# Patient Record
Sex: Female | Born: 1940
Health system: Southern US, Community
[De-identification: ages and names within clinical notes are randomized; demographics above are authoritative.]

## PROBLEM LIST (undated history)

## (undated) DIAGNOSIS — Z8719 Personal history of other diseases of the digestive system: Secondary | ICD-10-CM

## (undated) DIAGNOSIS — F411 Generalized anxiety disorder: Secondary | ICD-10-CM

## (undated) DIAGNOSIS — R269 Unspecified abnormalities of gait and mobility: Principal | ICD-10-CM

## (undated) DIAGNOSIS — M5137 Other intervertebral disc degeneration, lumbosacral region: Secondary | ICD-10-CM

## (undated) DIAGNOSIS — E785 Hyperlipidemia, unspecified: Secondary | ICD-10-CM

## (undated) DIAGNOSIS — N182 Chronic kidney disease, stage 2 (mild): Secondary | ICD-10-CM

## (undated) DIAGNOSIS — N951 Menopausal and female climacteric states: Secondary | ICD-10-CM

## (undated) DIAGNOSIS — R001 Bradycardia, unspecified: Secondary | ICD-10-CM

## (undated) DIAGNOSIS — E739 Lactose intolerance, unspecified: Secondary | ICD-10-CM

## (undated) DIAGNOSIS — M19049 Primary osteoarthritis, unspecified hand: Secondary | ICD-10-CM

## (undated) DIAGNOSIS — I951 Orthostatic hypotension: Secondary | ICD-10-CM

## (undated) DIAGNOSIS — R21 Rash and other nonspecific skin eruption: Secondary | ICD-10-CM

## (undated) DIAGNOSIS — M5030 Other cervical disc degeneration, unspecified cervical region: Secondary | ICD-10-CM

## (undated) DIAGNOSIS — R55 Syncope and collapse: Secondary | ICD-10-CM

## (undated) DIAGNOSIS — I10 Essential (primary) hypertension: Secondary | ICD-10-CM

## (undated) DIAGNOSIS — F329 Major depressive disorder, single episode, unspecified: Secondary | ICD-10-CM

## (undated) DIAGNOSIS — R5382 Chronic fatigue, unspecified: Secondary | ICD-10-CM

## (undated) DIAGNOSIS — E039 Hypothyroidism, unspecified: Secondary | ICD-10-CM

## (undated) DIAGNOSIS — I5032 Chronic diastolic (congestive) heart failure: Secondary | ICD-10-CM

## (undated) DIAGNOSIS — I739 Peripheral vascular disease, unspecified: Secondary | ICD-10-CM

## (undated) DIAGNOSIS — I351 Nonrheumatic aortic (valve) insufficiency: Secondary | ICD-10-CM

## (undated) DIAGNOSIS — Z950 Presence of cardiac pacemaker: Secondary | ICD-10-CM

## (undated) DIAGNOSIS — Z8673 Personal history of transient ischemic attack (TIA), and cerebral infarction without residual deficits: Secondary | ICD-10-CM

## (undated) DIAGNOSIS — T8543XA Leakage of breast prosthesis and implant, initial encounter: Secondary | ICD-10-CM

## (undated) DIAGNOSIS — I2510 Atherosclerotic heart disease of native coronary artery without angina pectoris: Secondary | ICD-10-CM

## (undated) DIAGNOSIS — I4892 Unspecified atrial flutter: Secondary | ICD-10-CM

## (undated) DIAGNOSIS — I495 Sick sinus syndrome: Secondary | ICD-10-CM

## (undated) DIAGNOSIS — F1090 Alcohol use, unspecified, uncomplicated: Secondary | ICD-10-CM

## (undated) DIAGNOSIS — Z7289 Other problems related to lifestyle: Secondary | ICD-10-CM

## (undated) DIAGNOSIS — I214 Non-ST elevation (NSTEMI) myocardial infarction: Secondary | ICD-10-CM

## (undated) DIAGNOSIS — E875 Hyperkalemia: Secondary | ICD-10-CM

## (undated) DIAGNOSIS — I4819 Other persistent atrial fibrillation: Secondary | ICD-10-CM

## (undated) DIAGNOSIS — I071 Rheumatic tricuspid insufficiency: Secondary | ICD-10-CM

## (undated) HISTORY — DX: Unspecified abnormalities of gait and mobility: R26.9

## (undated) HISTORY — DX: Primary osteoarthritis, unspecified hand: M19.049

## (undated) HISTORY — DX: Other persistent atrial fibrillation: I48.19

## (undated) HISTORY — DX: Personal history of other diseases of the digestive system: Z87.19

## (undated) HISTORY — DX: Generalized anxiety disorder: F41.1

## (undated) HISTORY — PX: OOPHORECTOMY: SHX86

## (undated) HISTORY — DX: Rheumatic tricuspid insufficiency: I07.1

## (undated) HISTORY — DX: Menopausal and female climacteric states: N95.1

## (undated) HISTORY — DX: Other cervical disc degeneration, unspecified cervical region: M50.30

## (undated) HISTORY — DX: Other intervertebral disc degeneration, lumbosacral region: M51.37

## (undated) HISTORY — DX: Nonrheumatic aortic (valve) insufficiency: I35.1

## (undated) HISTORY — DX: Major depressive disorder, single episode, unspecified: F32.9

## (undated) HISTORY — DX: Lactose intolerance, unspecified: E73.9

## (undated) HISTORY — PX: PERIPHERAL ARTERIAL STENT GRAFT: SHX2220

---

## 1973-02-09 HISTORY — PX: VAGINAL HYSTERECTOMY: SUR661

## 1981-02-09 HISTORY — PX: AUGMENTATION MAMMAPLASTY: SUR837

## 1983-02-10 HISTORY — PX: POSTERIOR CERVICAL LAMINECTOMY: SHX2248

## 1993-02-09 HISTORY — PX: FACELIFT, LOWER 2/3: SHX1567

## 1997-11-30 ENCOUNTER — Ambulatory Visit (HOSPITAL_COMMUNITY): Admission: RE | Admit: 1997-11-30 | Discharge: 1997-11-30 | Payer: Self-pay | Admitting: Cardiology

## 1997-11-30 ENCOUNTER — Encounter: Payer: Self-pay | Admitting: Cardiology

## 1998-04-02 ENCOUNTER — Ambulatory Visit (HOSPITAL_COMMUNITY): Admission: RE | Admit: 1998-04-02 | Discharge: 1998-04-02 | Payer: Self-pay | Admitting: Gastroenterology

## 1999-09-17 ENCOUNTER — Ambulatory Visit (HOSPITAL_COMMUNITY): Admission: RE | Admit: 1999-09-17 | Discharge: 1999-09-17 | Payer: Self-pay | Admitting: Cardiology

## 2000-02-10 LAB — CONVERTED CEMR LAB: Pap Smear: NORMAL

## 2002-02-09 LAB — HM MAMMOGRAPHY: HM Mammogram: NORMAL

## 2003-07-28 ENCOUNTER — Emergency Department (HOSPITAL_COMMUNITY): Admission: EM | Admit: 2003-07-28 | Discharge: 2003-07-29 | Payer: Self-pay | Admitting: Emergency Medicine

## 2005-03-12 LAB — HM COLONOSCOPY: HM Colonoscopy: NORMAL

## 2006-02-09 HISTORY — PX: CARPAL TUNNEL RELEASE: SHX101

## 2006-03-25 ENCOUNTER — Ambulatory Visit (HOSPITAL_BASED_OUTPATIENT_CLINIC_OR_DEPARTMENT_OTHER): Admission: RE | Admit: 2006-03-25 | Discharge: 2006-03-26 | Payer: Self-pay | Admitting: Orthopedic Surgery

## 2006-09-16 ENCOUNTER — Ambulatory Visit: Payer: Self-pay | Admitting: Internal Medicine

## 2007-07-01 ENCOUNTER — Encounter: Payer: Self-pay | Admitting: Internal Medicine

## 2007-07-08 ENCOUNTER — Encounter: Payer: Self-pay | Admitting: Internal Medicine

## 2007-08-19 ENCOUNTER — Encounter: Admission: RE | Admit: 2007-08-19 | Discharge: 2007-08-19 | Payer: Self-pay | Admitting: General Surgery

## 2007-09-02 ENCOUNTER — Encounter: Payer: Self-pay | Admitting: Internal Medicine

## 2007-10-20 ENCOUNTER — Inpatient Hospital Stay (HOSPITAL_COMMUNITY): Admission: RE | Admit: 2007-10-20 | Discharge: 2007-10-25 | Payer: Self-pay | Admitting: General Surgery

## 2007-10-20 ENCOUNTER — Encounter (INDEPENDENT_AMBULATORY_CARE_PROVIDER_SITE_OTHER): Payer: Self-pay | Admitting: General Surgery

## 2007-11-09 ENCOUNTER — Encounter: Payer: Self-pay | Admitting: Internal Medicine

## 2008-01-12 ENCOUNTER — Ambulatory Visit: Payer: Self-pay | Admitting: Internal Medicine

## 2008-01-23 ENCOUNTER — Encounter: Payer: Self-pay | Admitting: Internal Medicine

## 2008-02-10 HISTORY — PX: PARTIAL COLECTOMY: SHX5273

## 2008-08-06 ENCOUNTER — Encounter: Payer: Self-pay | Admitting: Internal Medicine

## 2008-08-28 ENCOUNTER — Encounter: Admission: RE | Admit: 2008-08-28 | Discharge: 2008-08-28 | Payer: Self-pay | Admitting: Orthopaedic Surgery

## 2008-09-25 ENCOUNTER — Encounter: Payer: Self-pay | Admitting: Internal Medicine

## 2008-11-16 ENCOUNTER — Encounter: Payer: Self-pay | Admitting: Internal Medicine

## 2009-07-25 ENCOUNTER — Encounter: Payer: Self-pay | Admitting: Internal Medicine

## 2009-11-20 ENCOUNTER — Telehealth (INDEPENDENT_AMBULATORY_CARE_PROVIDER_SITE_OTHER): Payer: Self-pay | Admitting: *Deleted

## 2009-12-11 ENCOUNTER — Encounter: Payer: Self-pay | Admitting: Internal Medicine

## 2009-12-12 ENCOUNTER — Encounter: Payer: Self-pay | Admitting: Internal Medicine

## 2009-12-31 ENCOUNTER — Ambulatory Visit: Payer: Self-pay | Admitting: Internal Medicine

## 2009-12-31 DIAGNOSIS — M19049 Primary osteoarthritis, unspecified hand: Secondary | ICD-10-CM

## 2009-12-31 DIAGNOSIS — F419 Anxiety disorder, unspecified: Secondary | ICD-10-CM | POA: Insufficient documentation

## 2009-12-31 DIAGNOSIS — F3289 Other specified depressive episodes: Secondary | ICD-10-CM

## 2009-12-31 DIAGNOSIS — I059 Rheumatic mitral valve disease, unspecified: Secondary | ICD-10-CM | POA: Insufficient documentation

## 2009-12-31 DIAGNOSIS — I080 Rheumatic disorders of both mitral and aortic valves: Secondary | ICD-10-CM | POA: Insufficient documentation

## 2009-12-31 DIAGNOSIS — F329 Major depressive disorder, single episode, unspecified: Secondary | ICD-10-CM

## 2009-12-31 DIAGNOSIS — M51379 Other intervertebral disc degeneration, lumbosacral region without mention of lumbar back pain or lower extremity pain: Secondary | ICD-10-CM

## 2009-12-31 DIAGNOSIS — M5137 Other intervertebral disc degeneration, lumbosacral region: Secondary | ICD-10-CM | POA: Insufficient documentation

## 2009-12-31 DIAGNOSIS — I2510 Atherosclerotic heart disease of native coronary artery without angina pectoris: Secondary | ICD-10-CM | POA: Insufficient documentation

## 2009-12-31 DIAGNOSIS — E739 Lactose intolerance, unspecified: Secondary | ICD-10-CM | POA: Insufficient documentation

## 2009-12-31 DIAGNOSIS — N951 Menopausal and female climacteric states: Secondary | ICD-10-CM | POA: Insufficient documentation

## 2009-12-31 DIAGNOSIS — E785 Hyperlipidemia, unspecified: Secondary | ICD-10-CM | POA: Insufficient documentation

## 2009-12-31 DIAGNOSIS — M5030 Other cervical disc degeneration, unspecified cervical region: Secondary | ICD-10-CM

## 2009-12-31 DIAGNOSIS — I10 Essential (primary) hypertension: Secondary | ICD-10-CM | POA: Insufficient documentation

## 2009-12-31 DIAGNOSIS — Z8719 Personal history of other diseases of the digestive system: Secondary | ICD-10-CM

## 2009-12-31 DIAGNOSIS — F411 Generalized anxiety disorder: Secondary | ICD-10-CM

## 2009-12-31 DIAGNOSIS — R32 Unspecified urinary incontinence: Secondary | ICD-10-CM | POA: Insufficient documentation

## 2009-12-31 DIAGNOSIS — R109 Unspecified abdominal pain: Secondary | ICD-10-CM | POA: Insufficient documentation

## 2009-12-31 HISTORY — DX: Major depressive disorder, single episode, unspecified: F32.9

## 2009-12-31 HISTORY — DX: Lactose intolerance, unspecified: E73.9

## 2009-12-31 HISTORY — DX: Other specified depressive episodes: F32.89

## 2009-12-31 HISTORY — DX: Hyperlipidemia, unspecified: E78.5

## 2009-12-31 HISTORY — DX: Generalized anxiety disorder: F41.1

## 2009-12-31 HISTORY — DX: Other intervertebral disc degeneration, lumbosacral region: M51.37

## 2009-12-31 HISTORY — DX: Other intervertebral disc degeneration, lumbosacral region without mention of lumbar back pain or lower extremity pain: M51.379

## 2009-12-31 HISTORY — DX: Primary osteoarthritis, unspecified hand: M19.049

## 2009-12-31 HISTORY — DX: Other cervical disc degeneration, unspecified cervical region: M50.30

## 2009-12-31 HISTORY — DX: Menopausal and female climacteric states: N95.1

## 2009-12-31 HISTORY — DX: Personal history of other diseases of the digestive system: Z87.19

## 2009-12-31 LAB — CONVERTED CEMR LAB
Bilirubin Urine: NEGATIVE
Hemoglobin, Urine: NEGATIVE
Ketones, ur: NEGATIVE mg/dL
Leukocytes, UA: NEGATIVE
Nitrite: NEGATIVE
Specific Gravity, Urine: <=1.005 (ref 1.000–1.030)
Total Protein, Urine: NEGATIVE mg/dL
Urine Glucose: NEGATIVE mg/dL
Urobilinogen, UA: 0.2 (ref 0.0–1.0)
pH: 6 (ref 5.0–8.0)

## 2010-01-01 ENCOUNTER — Encounter: Payer: Self-pay | Admitting: Internal Medicine

## 2010-01-01 ENCOUNTER — Ambulatory Visit: Payer: Self-pay | Admitting: Cardiology

## 2010-02-04 ENCOUNTER — Encounter: Payer: Self-pay | Admitting: Internal Medicine

## 2010-02-14 ENCOUNTER — Encounter
Admission: RE | Admit: 2010-02-14 | Discharge: 2010-02-14 | Payer: Self-pay | Source: Home / Self Care | Attending: Cardiology | Admitting: Cardiology

## 2010-02-20 ENCOUNTER — Encounter: Payer: Self-pay | Admitting: Internal Medicine

## 2010-02-20 ENCOUNTER — Inpatient Hospital Stay (HOSPITAL_COMMUNITY)
Admission: RE | Admit: 2010-02-20 | Discharge: 2010-02-21 | Payer: Self-pay | Source: Home / Self Care | Attending: Cardiovascular Disease | Admitting: Cardiovascular Disease

## 2010-02-24 LAB — TROPONIN I: Troponin I: 0.01 ng/mL (ref 0.00–0.06)

## 2010-02-24 LAB — BASIC METABOLIC PANEL
BUN: 14 mg/dL (ref 6–23)
BUN: 11 mg/dL (ref 6–23)
CO2: 28 mEq/L (ref 19–32)
CO2: 30 mEq/L (ref 19–32)
Calcium: 10.2 mg/dL (ref 8.4–10.5)
Calcium: 10.5 mg/dL (ref 8.4–10.5)
Chloride: 103 mEq/L (ref 96–112)
Chloride: 103 mEq/L (ref 96–112)
Creatinine, Ser: 0.89 mg/dL (ref 0.4–1.2)
Creatinine, Ser: 0.78 mg/dL (ref 0.4–1.2)
GFR calc Af Amer: >60 mL/min (ref >60)
GFR calc Af Amer: >60 mL/min (ref >60)
GFR calc non Af Amer: >60 mL/min (ref >60)
GFR calc non Af Amer: >60 mL/min (ref >60)
Glucose, Bld: 111 mg/dL — ABNORMAL HIGH (ref 70–99)
Glucose, Bld: 114 mg/dL — ABNORMAL HIGH (ref 70–99)
Potassium: 4.9 mEq/L (ref 3.5–5.1)
Potassium: 5 mEq/L (ref 3.5–5.1)
Sodium: 135 mEq/L (ref 135–145)
Sodium: 137 mEq/L (ref 135–145)

## 2010-02-24 LAB — CBC
HCT: 36 % (ref 36.0–46.0)
HCT: 38.4 % (ref 36.0–46.0)
Hemoglobin: 11.9 g/dL — ABNORMAL LOW (ref 12.0–15.0)
Hemoglobin: 12.6 g/dL (ref 12.0–15.0)
MCH: 29.8 pg (ref 26.0–34.0)
MCH: 29.6 pg (ref 26.0–34.0)
MCHC: 33.1 g/dL (ref 30.0–36.0)
MCHC: 32.8 g/dL (ref 30.0–36.0)
MCV: 90 fL (ref 78.0–100.0)
MCV: 90.1 fL (ref 78.0–100.0)
Platelets: 239 10*3/uL (ref 150–400)
Platelets: 250 10*3/uL (ref 150–400)
RBC: 4 MIL/uL (ref 3.87–5.11)
RBC: 4.26 MIL/uL (ref 3.87–5.11)
RDW: 13 % (ref 11.5–15.5)
RDW: 12.9 % (ref 11.5–15.5)
WBC: 6 10*3/uL (ref 4.0–10.5)
WBC: 8.3 10*3/uL (ref 4.0–10.5)

## 2010-02-24 LAB — CARDIAC PANEL(CRET KIN+CKTOT+MB+TROPI)
CK, MB: 1 ng/mL (ref 0.3–4.0)
CK, MB: 0.9 ng/mL (ref 0.3–4.0)
CK, MB: 0.9 ng/mL (ref 0.3–4.0)
Relative Index: INVALID (ref 0.0–2.5)
Relative Index: INVALID (ref 0.0–2.5)
Relative Index: INVALID (ref 0.0–2.5)
Total CK: 31 U/L (ref 7–177)
Total CK: 29 U/L (ref 7–177)
Total CK: 29 U/L (ref 7–177)
Troponin I: <0.01 ng/mL (ref 0.00–0.06)
Troponin I: <0.01 ng/mL (ref 0.00–0.06)
Troponin I: <0.01 ng/mL (ref 0.00–0.06)

## 2010-02-24 LAB — CK TOTAL AND CKMB (NOT AT ARMC)
CK, MB: 1.1 ng/mL (ref 0.3–4.0)
Relative Index: INVALID (ref 0.0–2.5)
Total CK: 40 U/L (ref 7–177)

## 2010-02-25 ENCOUNTER — Encounter: Payer: Self-pay | Admitting: Internal Medicine

## 2010-03-03 ENCOUNTER — Encounter: Payer: Self-pay | Admitting: Internal Medicine

## 2010-03-10 ENCOUNTER — Inpatient Hospital Stay (HOSPITAL_COMMUNITY)
Admission: RE | Admit: 2010-03-10 | Discharge: 2010-03-11 | Payer: Self-pay | Source: Home / Self Care | Attending: Cardiovascular Disease | Admitting: Cardiovascular Disease

## 2010-03-10 LAB — PROTIME-INR
INR: 0.95 (ref 0.00–1.49)
Prothrombin Time: 12.9 seconds (ref 11.6–15.2)

## 2010-03-10 LAB — BASIC METABOLIC PANEL
BUN: 10 mg/dL (ref 6–23)
CO2: 28 mEq/L (ref 19–32)
Calcium: 10.6 mg/dL — ABNORMAL HIGH (ref 8.4–10.5)
Chloride: 105 mEq/L (ref 96–112)
Creatinine, Ser: 0.73 mg/dL (ref 0.4–1.2)
GFR calc Af Amer: >60 mL/min (ref >60)
GFR calc non Af Amer: >60 mL/min (ref >60)
Glucose, Bld: 121 mg/dL — ABNORMAL HIGH (ref 70–99)
Potassium: 5.4 mEq/L — ABNORMAL HIGH (ref 3.5–5.1)
Sodium: 141 mEq/L (ref 135–145)

## 2010-03-10 LAB — CBC
HCT: 37.6 % (ref 36.0–46.0)
Hemoglobin: 12.2 g/dL (ref 12.0–15.0)
MCH: 29 pg (ref 26.0–34.0)
MCHC: 32.4 g/dL (ref 30.0–36.0)
MCV: 89.5 fL (ref 78.0–100.0)
Platelets: 331 10*3/uL (ref 150–400)
RBC: 4.2 MIL/uL (ref 3.87–5.11)
RDW: 13.2 % (ref 11.5–15.5)
WBC: 6.1 10*3/uL (ref 4.0–10.5)

## 2010-03-10 LAB — POCT ACTIVATED CLOTTING TIME
Activated Clotting Time: 187 seconds
Activated Clotting Time: 158 seconds

## 2010-03-10 LAB — APTT: aPTT: 31 seconds (ref 24–37)

## 2010-03-11 LAB — CBC
HCT: 38.5 % (ref 36.0–46.0)
Hemoglobin: 12.6 g/dL (ref 12.0–15.0)
MCH: 29.2 pg (ref 26.0–34.0)
MCHC: 32.7 g/dL (ref 30.0–36.0)
MCV: 89.3 fL (ref 78.0–100.0)
Platelets: 302 10*3/uL (ref 150–400)
RBC: 4.31 MIL/uL (ref 3.87–5.11)
RDW: 13 % (ref 11.5–15.5)
WBC: 6.5 10*3/uL (ref 4.0–10.5)

## 2010-03-11 LAB — BASIC METABOLIC PANEL
BUN: 10 mg/dL (ref 6–23)
CO2: 27 mEq/L (ref 19–32)
Calcium: 10.3 mg/dL (ref 8.4–10.5)
Chloride: 102 mEq/L (ref 96–112)
Creatinine, Ser: 0.69 mg/dL (ref 0.4–1.2)
GFR calc Af Amer: >60 mL/min (ref >60)
GFR calc non Af Amer: >60 mL/min (ref >60)
Glucose, Bld: 108 mg/dL — ABNORMAL HIGH (ref 70–99)
Potassium: 4.5 mEq/L (ref 3.5–5.1)
Sodium: 139 mEq/L (ref 135–145)

## 2010-03-13 LAB — POCT ACTIVATED CLOTTING TIME
Activated Clotting Time: 228 seconds
Activated Clotting Time: 234 seconds

## 2010-03-13 NOTE — Procedures (Signed)
Summary: [facility]   Imported By: Sherian Rein 12/19/2009 14:13:23  _____________________________________________________________________  External Attachment:    Type:   Image     Comment:   External Document

## 2010-03-13 NOTE — Letter (Signed)
Summary: [facility]  [facility]   Imported By: Sherian Rein 02/25/2010 09:04:28  _____________________________________________________________________  External Attachment:    Type:   Image     Comment:   External Document

## 2010-03-13 NOTE — Consult Note (Signed)
Summary: [facility]   Imported By: Esmeralda Links D'jimraou 07/20/2007 12:58:16  _____________________________________________________________________  External Attachment:    Type:   Image     Comment:   External Document

## 2010-03-13 NOTE — Letter (Signed)
Summary: [facility]  [facility]   Imported By: Sherian Rein 11/26/2008 08:47:36  _____________________________________________________________________  External Attachment:    Type:   Image     Comment:   External Document

## 2010-03-13 NOTE — Consult Note (Signed)
Summary: Imported By: Esmeralda Links D'jimraou 09/09/2007 13:17:20   Imported By: Esmeralda Links D'jimraou 09/09/2007 13:17:20  _____________________________________________________________________  External Attachment:    Type:   Image     Comment:   External Document

## 2010-03-13 NOTE — Letter (Signed)
Summary: [facility]  [facility]   Imported By: Sherian Rein 09/28/2008 10:28:54  _____________________________________________________________________  External Attachment:    Type:   Image     Comment:   External Document

## 2010-03-13 NOTE — Progress Notes (Signed)
Summary: [facility]   Imported By: Esmeralda Links D'jimraou 07/13/2007 11:49:21  _____________________________________________________________________  External Attachment:    Type:   Image     Comment:   External Document

## 2010-03-13 NOTE — Letter (Signed)
Summary: Low back & bilateral [facility]  [facility]   Imported By: Sherian Rein 08/09/2008 15:12:42  _____________________________________________________________________  External Attachment:    Type:   Image     Comment:   External Document

## 2010-03-13 NOTE — Letter (Signed)
Summary: Wound check/[facility]  Wound check/[facility]   Imported By: Lester [place] 02/01/2008 09:14:19  _____________________________________________________________________  External Attachment:    Type:   Image     Comment:   External Document

## 2010-03-13 NOTE — Progress Notes (Signed)
  Phone Note Other Incoming   Request: Send information Summary of Call: Request for records received from [facility] [place], [place]. Request forwarded to [employer].

## 2010-03-13 NOTE — Assessment & Plan Note (Signed)
Summary: FLU Zara Council Natale Milch  Nurse Visit   Vitals Entered By: Payton Spark CMA (January 12, 2008 11:19 AM)                    Influenza Vaccine    Vaccine Type: Fluvax Non-[name]    Site: left deltoid    Mfr: GlaxoSmithKline    Dose: 0.5 ml    Route: IM    Given by: Payton Spark CMA    Exp. Date: 08/08/2008    Lot #: [id number]    VIS given: 09/02/06 version given January 12, 2008.  Flu Vaccine Consent Questions    Do you have a history of severe allergic reactions to this vaccine? no    Any prior history of allergic reactions to egg and/or gelatin? no    Do you have a sensitivity to the preservative Thimersol? no    Do you have a past history of Guillan-Barre Syndrome? no    Do you currently have an acute febrile illness? no    Have you ever had a severe reaction to latex? no    Vaccine information given and explained to patient? yes    Are you currently pregnant? no   Orders Added: 1)  Influenza Vaccine NON [name] [00028]    ]

## 2010-03-13 NOTE — Letter (Signed)
Summary: [facility]  [facility]   Imported By: Sherian Rein 12/19/2009 14:42:25  _____________________________________________________________________  External Attachment:    Type:   Image     Comment:   External Document

## 2010-03-13 NOTE — Letter (Signed)
Summary: [facility]  [facility]   Imported By: Lester Galva 07/31/2009 07:43:15  _____________________________________________________________________  External Attachment:    Type:   Image     Comment:   External Document

## 2010-03-13 NOTE — Assessment & Plan Note (Signed)
Summary: last ov 2008/abd pain/medicare/cd   Vital Signs:  Patient profile:   70 year old female Height:      64.5 inches Weight:      159.13 pounds BMI:     26.99 O2 Sat:      97 % on Room air Temp:     98.9 degrees F oral Pulse rate:   57 / minute BP sitting:   120 / 70  (left arm) Cuff size:   regular  Vitals Entered By: Zella Ball Ewing CMA Duncan Dull) (December 31, 2009 3:18 PM)  O2 Flow:  Room air  Preventive Care Screening  Colonoscopy:    Date:  03/12/2005    Next Due:  03/2010    Results:  normal   Mammogram:    Date:  02/09/2002    Results:  normal   Last Tetanus Booster:    Date:  12/10/2000    Results:  Tdap   Bone Density:    Date:  02/10/2000    Results:  normal std dev  Pap Smear:    Date:  02/10/2000    Results:  normal   Last Pneumovax:    Date:  02/10/1999    Results:  given      declines mamogram, colonoscopy or immunizations  CC: Re-establish, stomach pain, fever/RE   CC:  Re-establish, stomach pain, and fever/RE.  History of Present Illness: here as new pt to re-establish after being lost to f/u for > 3 yrs;  has hx of diverticulitis requiring partial colectomy, as well as chronic recurrent urinary incontinence no change recently without dysuria, urgency change, blood or back pain.  Has LLQ adn mid lower abd pain mild to mod persistent for 10-14 days (maybe actually better in last 2 days) ;  did see urgent care with neg urine cx per pt prior to this time,  and has spent most of the last 10 days in bed ill  out of town for vacation (not clear why she did not seek further care during this time).  Has some nausea, but no vomiting and little to no bowel change or blood.  No sure about fever, but has had several chills in the past few days.  Has bee on statin wtihout significant bowel change in the past.  Pt denies CP, worsening sob, doe, wheezing, orthopnea, pnd, worsening LE edema, palps, dizziness or syncope  Pt denies new neuro symptoms such as  headache, facial or extremity weakness  Pt denies polydipsia, polyuria.  Just had labs approx 2 wks ago per DR Little/cards as well m, with whom she has followed for HTN, elev chol, and symtpomatic LE PVD .  Stopped smoking yrs ago.  Overall good compliance with meds, and good tolerability.   Preventive Screening-Counseling & Management  Alcohol-Tobacco     Smoking Status: quit      Drug Use:  no.    Problems Prior to Update: 1)  Menopause, Early  (ICD-627.2) 2)  Coronary Artery Disease  (ICD-414.00) 3)  Depression  (ICD-311) 4)  Mitral Regurgitation  (ICD-396.3) 5)  Mitral Valve Prolapse  (ICD-424.0) 6)  Glucose Intolerance  (ICD-271.3) 7)  Disc Disease, Cervical  (ICD-722.4) 8)  Unspecified Urinary Incontinence  (ICD-788.30) 9)  Diverticulitis, Hx of  (ICD-V12.79) 10)  Abdominal Pain, Lower  (ICD-789.09) 11)  Disc Disease, Lumbar  (ICD-722.52) 12)  Osteoarthritis, Hand  (ICD-715.94) 13)  Hyperlipidemia  (ICD-272.4) 14)  Anxiety  (ICD-300.00) 15)  Hypertension  (ICD-401.9)  Medications Prior to Update: 1)  None  Current Medications (verified): 1)  Meloxicam 15 Mg Tabs (Meloxicam) .Marland Kitchen.. 1 By Mouth Once Daily 2)  Citalopram Hydrobromide 20 Mg Tabs (Citalopram Hydrobromide) .Marland Kitchen.. 1 By Mouth Once Daily 3)  Losartan Potassium 100 Mg Tabs (Losartan Potassium) .Marland Kitchen.. 1 By Mouth Once Daily 4)  Amlodipine Besylate 5 Mg Tabs (Amlodipine Besylate) .Marland Kitchen.. 1 By Mouth Once Daily 5)  Vitamin B-12 1000 Mcg Tabs (Cyanocobalamin) .Marland Kitchen.. 1 By Mouth Once Daily 6)  Aspir-Low 81 Mg Tbec (Aspirin) .Marland Kitchen.. 1 By Mouth Once Daily 7)  Ciprofloxacin Hcl 500 Mg Tabs (Ciprofloxacin Hcl) .Marland Kitchen.. 1po Two Times A Day 8)  Metronidazole 250 Mg Tabs (Metronidazole) .Marland Kitchen.. 1 By Mouth Qid  Allergies (verified): No Known Drug Allergies  Past History:  Family History: Last updated: 12/31/2009 heart disease - 3 brother (2 also with cva)  Social History: Last updated: 12/31/2009 Former Smoker Alcohol use-no Drug  use-no Married 3 children retired - mortgage banking  Risk Factors: Smoking Status: quit (12/31/2009)  Past Medical History: Hypertension Anxiety DJD - hands, lumbar disc disease/c-spine disc disease Hyperlipidemia ?PVD - for dopper arterial dec 2011 known inguinal right hernia Diverticulitis, hx of urinary incontinence, chronic glucose intolerance MVP/MR Depression Coronary artery disease  - minimal LAD by cath 2002 early menopause  Past Surgical History: s/p  right thumb surgury 2008 neg card cath per pt approx 2005 s/p bilat breast implants  Hysterectomy/endometriosis Oophorectomy -  1975 hx of complicated diverticulitis - s/p partial colectomy Carpal tunnel release s/p c-spine surgury  Family History: Reviewed history and no changes required. heart disease - 3 brother (2 also with cva)  Social History: Reviewed history and no changes required. 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 , mild ill  Head:  normocephalic and atraumatic.   Eyes:  vision grossly intact, pupils equal, and pupils round.   Ears:  R ear normal and L ear normal.   Nose:  no external deformity and no nasal discharge.   Mouth:  no gingival abnormalities and pharynx pink and moist.   Neck:  supple and no masses.   Lungs:  normal respiratory effort and normal breath sounds.   Heart:  normal rate and regular rhythm.   Abdomen:  soft, normal bowel sounds, no distention, no guarding, no rebound tenderness, no hepatomegaly, and no splenomegaly.  but has mild to mod left lower and mid lower abd tender Msk:  no joint tenderness and no joint swelling.   Extremities:  no edema, no erythema  Skin:  no rashes.   Psych:  slightly anxious.     Impression & Recommendations:  Problem # 1:   ABDOMINAL PAIN, LOWER (ICD-789.09)  Her updated medication list for this problem includes:    Meloxicam 15 Mg Tabs (Meloxicam) .Marland Kitchen... 1 by mouth once daily    Aspir-low 81 Mg Tbec (Aspirin) .Marland Kitchen... 1 by mouth once daily mid and left lower abd, to check urine studies, but no worsening urine symtpoms, will today presume smoldering or worse recurrent diverticulitis, tx with cipro/flagyl, f/u dr Fredirick Maudlin labs from 2 wks ago, declines other labs today except for urine studies, and also for CT abd/pelvis; consider GI consult  Orders: T-Culture, Urine ([id number]) Radiology Referral (Radiology) TLB-Udip w/ Micro (81001-URINE)  Problem # 2:  HYPERTENSION (ICD-401.9)  Her updated  medication list for this problem includes:    Losartan Potassium 100 Mg Tabs (Losartan potassium) .Marland Kitchen... 1 by mouth once daily    Amlodipine Besylate 5 Mg Tabs (Amlodipine besylate) .Marland Kitchen... 1 by mouth once daily  BP today: 120/70 stable overall by hx and exam, ok to continue meds/tx as is   Problem # 3:  HYPERLIPIDEMIA (ICD-272.4) unclear control, good complaicne with statin, will ask for recent labs per dr Clarene Duke  Problem # 4:  GLUCOSE INTOLERANCE (ICD-271.3) asympt for now; f/u glc with recent labs  Complete Medication List: 1)  Meloxicam 15 Mg Tabs (Meloxicam) .Marland Kitchen.. 1 by mouth once daily 2)  Citalopram Hydrobromide 20 Mg Tabs (Citalopram hydrobromide) .Marland Kitchen.. 1 by mouth once daily 3)  Losartan Potassium 100 Mg Tabs (Losartan potassium) .Marland Kitchen.. 1 by mouth once daily 4)  Amlodipine Besylate 5 Mg Tabs (Amlodipine besylate) .Marland Kitchen.. 1 by mouth once daily 5)  Vitamin B-12 1000 Mcg Tabs (Cyanocobalamin) .Marland Kitchen.. 1 by mouth once daily 6)  Aspir-low 81 Mg Tbec (Aspirin) .Marland Kitchen.. 1 by mouth once daily 7)  Ciprofloxacin Hcl 500 Mg Tabs (Ciprofloxacin hcl) .Marland Kitchen.. 1po two times a day 8)  Metronidazole 250 Mg Tabs (Metronidazole) .Marland Kitchen.. 1 by mouth qid  Patient Instructions: 1)  Please go to the Lab in the basement for your blood and/or urine tests  today 2)  You will be contacted about the referral(s) to: CT scan - to see the [facility] prior to leaving today 3)  Please take all new medications as prescribed 4)  Continue all previous medications as before this visit 5)  we will get the lab results from Dr Little's office 6)  Please schedule a follow-up appointment in 6 months with CPX and labs, or sooner if needed Prescriptions: METRONIDAZOLE 250 MG TABS (METRONIDAZOLE) 1 by mouth qid  #40 x 0   Entered and Authorized by:   Corwin Levins MD   Signed by:   Corwin Levins MD on 12/31/2009   Method used:   Print then Give to Patient   RxID:   [id number] CIPROFLOXACIN HCL 500 MG TABS (CIPROFLOXACIN HCL) 1po two times a day  #20 x 0   Entered and Authorized by:   Corwin Levins MD   Signed by:   Corwin Levins MD on 12/31/2009   Method used:   Print then Give to Patient   RxID:   [id number]    Orders Added: 1)  T-Culture, Urine [[id number]] 2)  Radiology Referral [Radiology] 3)  TLB-Udip w/ Micro [81001-URINE] 4)  New Patient Level IV [41324]

## 2010-03-13 NOTE — Consult Note (Signed)
Summary: [facility]   Imported By: Esmeralda Links D'jimraou 11/18/2007 11:21:27  _____________________________________________________________________  External Attachment:    Type:   Image     Comment:   External Document

## 2010-03-21 NOTE — Discharge Summary (Signed)
  [name], [name] NO.:  [id number]  MEDICAL RECORD NO.:  [id number]          PATIENT TYPE:  INP  LOCATION:  6529                         FACILITY:  [facility]  PHYSICIAN:  Nanetta Batty, M.D.   DATE OF BIRTH:  03/30/1940  DATE OF ADMISSION:  03/10/2010 DATE OF DISCHARGE:  03/11/2010                              DISCHARGE SUMMARY   DISCHARGE DIAGNOSES: 1. Claudication, right superficial femoral artery stenting on February 20, 2010, with a staged left superficial femoral artery stenting     this admission on March 10, 2010. 2. Treated hypertension. 3. Treated dyslipidemia.  HOSPITAL COURSE:  The patient is a pleasant 70 year old female followed by Dr. Clarene Duke and Dr. Oliver Barre.  She was referred to Dr. Allyson Sabal for claudication.  She underwent right SFA stenting on February 20, 2010, and is admitted now for elective left SFA stenting.  She was admitted as a same-day admission.  She tolerated the procedure well.  Postoperatively, she is stable and we feel she can be discharged.  She will need followup Dopplers as an outpatient and an office visit with Dr. Allyson Sabal once and then can be referred back to her primary cardiologist and primary care MD.  LABORATORY DATA:  Sodium 139, potassium 4.5, BUN 10, creatinine 0.69. White count 6.5, hemoglobin 12.6, hematocrit 38.5, platelets 302,000.  A chest x-ray was done on February 14, 2010, this showed no active lung disease with slight hyperaeration.  Telemetry shows sinus rhythm, sinus bradycardia.  EKG from February 21, 2010, shows sinus rhythm, sinus bradycardia without acute changes.  The patient did have an outpatient Myoview on February 20, 2010, which was negative for ischemia.  Remote catheterization in 2001 showed mild coronary disease.  Please see med rec for complete discharge medications.  DISPOSITION:  The patient was discharged in stable condition and will follow up with Dr. Allyson Sabal in a couple  weeks.     Abelino Derrick, P.A.   ______________________________ Nanetta Batty, M.D.   Lenard Lance  D:  03/11/2010  T:  03/11/2010  Job:  [id number]  cc:   Corwin Levins, MD Thereasa Solo Little, M.D.  Electronically Signed by Corine Shelter P.A. on 03/18/2010 12:07:52 PM Electronically Signed by Nanetta Batty M.D. on 03/21/2010 08:06:05 AM

## 2010-03-21 NOTE — Discharge Summary (Signed)
[name], JHA NO.:  [id number]  MEDICAL RECORD NO.:  [id number]          PATIENT TYPE:  INP  LOCATION:  6527                         FACILITY:  [facility]  PHYSICIAN:  Nanetta Batty, M.D.   DATE OF BIRTH:  15-Dec-1940  DATE OF ADMISSION:  02/20/2010 DATE OF DISCHARGE:  02/21/2010                              DISCHARGE SUMMARY   DISCHARGE DIAGNOSES: 1. Claudication. 2. Peripheral vascular disease with bilateral mid right and left     superficial femoral artery stenosis, undergoing PTA and stent     deployment to the mid right superficial femoral artery stenosis due     to increased claudication in the right.  Residual left superficial     femoral artery stenosis, plans for staged intervention in the     future. 3. Chest pain after cardiac cath with negative cardiac enzymes and     stable EKGs.     a.     History of 30% to 40% left anterior descending stenosis in      2001. 4. Hyperlipidemia. 5. Hypertension. 6. Sinus bradycardia.  DISCHARGE CONDITION:  Stable.  PROCEDURES:  PV angiogram, February 20, 2010, with PTA and stent deployment to the right SFA by Dr. Nanetta Batty.  HOSPITAL COURSE:  Ms. Underberg was admitted after her PV angiogram and right PTA and stent to the SFA.  After the procedure, patient did develop chest pain and was given fentanyl, morphine 2 mg IV, sublingual nitro spray.  EKG was without acute changes.  Cardiac enzymes were done, which were all negative.  She was then placed on 6500.  She stabilized and had no further chest pain.  Unsure if this was related to Plavix, reflux, or cardiac issues.  By February 21, 2010, she ambulated without problems.  Her labs were stable with CK 29, MB 0.9, and troponin I less than 0.01.  Hemoglobin 11.9, hematocrit 36, WBC 6.0, and platelets 239.  Chemistry, sodium 135, potassium 4.9, BUN 14, creatinine 0.89 and glucose 111.  PHYSICAL EXAMINATION:  VITAL SIGNS:  Blood pressure 110/58, pulse 49  to 44, respiratory rate 12, temperature 98.3, oxygen saturation on 2 L 95%. HEART:  Regular rate and rhythm. LUNGS:  Clear. EXTREMITIES:  Her groin cath site was stable with 2+ right pedal pulses.  DISCHARGE INSTRUCTIONS: 1. Increase activity slowly.  May shower.  No lifting for 1 week.  No     driving for 2 days.  No sexual activity for 2 days. 2. Low-sodium, heart-healthy diet.  Wash cath site with soap and     water.  Call if any bleeding, swelling or drainage. 3. Follow up with Dr. Allyson Sabal, March 03, 2010, at 9:15 a.m. 4. You are scheduled for Persantine Myoview stress test in our office     on February 25, 2010, at 7:30 in the morning.  Do not eat or drink     after midnight, the night before the test; no caffeine for 24 hours     before the test.  Wear two-piece clothing and no perfumes.  Once her stress test is stable, she will be scheduled for staged  intervention to the left SFA if it is positive.  Discussion will be made about cardiac catheterization.  HOME MEDICATIONS:  Please see medication reconciliation sheet.  Pepcid was added to her medications as well as her Plavix.     [name]. [name], N.P.   ______________________________ Nanetta Batty, M.D.    LRI/MEDQ  D:  02/21/2010  T:  02/22/2010  Job:  [id number]  cc:   Corwin Levins, MD Thereasa Solo Little, M.D.  Electronically Signed by Nada Boozer N.P. on 03/07/2010 07:46:18 PM Electronically Signed by Nanetta Batty M.D. on 03/21/2010 08:06:03 AM

## 2010-03-21 NOTE — Procedures (Signed)
  [name], NEWLUN NO.:  [id number]  MEDICAL RECORD NO.:  [id number]          PATIENT TYPE:  OIB  LOCATION:  6529                         FACILITY:  [facility]  PHYSICIAN:  Nanetta Batty, M.D.   DATE OF BIRTH:  Jun 26, 1940  DATE OF PROCEDURE:  03/10/2010 DATE OF DISCHARGE:                   PERIPHERAL VASCULAR INVASIVE PROCEDURE   PROCEDURE:  Left lower extremity angiogram/percutaneous transluminal angioplasty and stent placement.  OPERATOR:  Nanetta Batty, MD  INDICATIONS FOR PROCEDURE:  Ms. [name] is a 70 year old mildly overweight married Caucasian female mother of three, grandmother of six grandchildren, and patient of Dr. Clarene Duke and Dr. Jonny Ruiz.  She is referred to me because of claudication.  She has a history of hypertension, hyperlipidemia, family history as well as remote tobacco abuse.  She is complaining of claudication.  Lower extremity Dopplers done in the office in December revealed a right ABI of 0.75 and on the left of 1 with high-frequency signals in both SFAs, right greater than left.  I did angiogram her on February 20, 2010 and stented her right SFA resulting in clinical improvement.  She is here for staged left SFA intervention for lifestyle-limiting claudication.  PROCEDURE DESCRIPTION:  The patient was brought to the second floor [facility] angiographic suite in the postabsorptive state.  She was premedicated with p.o. Valium.  Right groin was prepped and shaved in the usual sterile fashion.  Xylocaine 1% was used for local anesthesia. A 6-French crossover sheath was inserted into the right femoral artery using standard Seldinger technique.  A 5-French crossover catheter as well as 0.035 Versacore wire was used to obtain contralateral access. Total of 6000 units of heparin was administered intravenously. Visipaque dye was used for the entirety of the case.  Retrograde aortic pressures were monitored during the case.  The Versacore wire  crossed the lesion with the aid of a 4 x 4 balloon. Overlapping inflations were performed with nominal pressures.  Stenting was performed with a 6 x 80 Abbott Absolute Pro nitinol self-expanding stent with postdilatation with a 5 x 60 balloon at 4-6 atmospheres resulting reduction of tandem 60-75% fairly focal stenosis to 0% residual with excellent flow.  The patient tolerated the procedure well. Sheath was then withdrawn across the bifurcation and the wire was removed.  The patient left the lab in stable condition.  IMPRESSION:  Successful left superficial femoral artery percutaneous transluminal angioplasty and stenting for lifestyle-limiting claudication with an Abbott Absolute Pro nitinol self-expanding stent. The patient will be treated with aspirin and Plavix, gently hydrated, carried overnight, and discharged home in the morning.  She will get followup Dopplers and ABIs at our office after which I will see her back in followup.  She left the lab in stable condition.     Nanetta Batty, M.D. JB/MEDQ  D:  03/10/2010  T:  03/11/2010  Job:  [id number]  cc:   Second Floor [facility] Angiographic Suite [facility] Gaspar Garbe B. Little, M.D. Corwin Levins, MD  Electronically Signed by Nanetta Batty M.D. on 03/21/2010 08:06:07 AM

## 2010-03-27 NOTE — Letter (Signed)
Summary: [facility]  [facility]   Imported By: Lennie Odor 03/21/2010 15:52:42  _____________________________________________________________________  External Attachment:    Type:   Image     Comment:   External Document

## 2010-06-24 NOTE — Op Note (Signed)
NAMEKADINCE, BOXLEY NO.:  [id number]   MEDICAL RECORD NO.:  [id number]          PATIENT TYPE:  INP   LOCATION:  0004                         FACILITY:  [facility]   PHYSICIAN:  Adolph Pollack, M.D.DATE OF BIRTH:  [birth year]   DATE OF PROCEDURE:  10/20/2007  DATE OF DISCHARGE:                               OPERATIVE REPORT   PREOPERATIVE DIAGNOSIS:  Recurrent sigmoid diverticulitis.   POSTOPERATIVE DIAGNOSIS:  Recurrent sigmoid diverticulitis.   PROCEDURE:  Laparoscopic hand assisted left colectomy with mobilization  of splenic flexure.   SURGEON:  Adolph Pollack, M.D.   ASSISTANT:  Leonie Man, M.D.   ANESTHESIA:  General.   INDICATIONS:  This 70 year old female has had recurrent bouts of sigmoid  diverticulitis and now presents for elective sigmoid colectomy.  Barium  enema demonstrates disease to be mostly in the sigmoid colon with some  scattered disease on the left colon and splenic flexure area.   TECHNIQUE:  She is brought to the operating room, placed supine on the  operating table and general anesthetic was administered.  A Foley  catheter was placed in the bladder and she was placed in the lithotomy  position.  The abdominal wall and perineal area were sterilely prepped  and draped.  I made an approximately 7-8 cm lower midline incision  through the skin, subcutaneous tissue, fascia and peritoneum.  A hand  assist device was then inserted.  I then placed a 10/11 mm trocar in the  supraumbilical midline and pneumoperitoneum was created.  Under direct  vision a 5 mm trocar was placed in the left lower quadrant.  I then  identified the area of pathology fairly quickly, a very firm area of the  sigmoid colon densely adherent to the lateral sidewall.  Using sharp  dissection and select electrocautery I mobilized the left colon proximal  to this area by dividing its lateral attachments.  I then approached the  splenic flexure and using Harmonic  scalpel I mobilized the splenic  flexure being able to drop it down near to the pelvis.  Once I had done  this I then reapproached the area of disease.  By way of laparoscopic  instruments I was not able to dissect this free as it was densely  adherent and very firm.  Thus I removed the GelPort and extended the  incision inferiorly.  A self retaining retractor was used.  Under direct  vision and staying close to the wall of the colon I used some blunt  dissection and select electrocautery to dissect the firm inflammatory  mass from the lateral sidewall.  The sidewall and posterior to this was  fairly scarred with chronic inflammatory tissue.  I stayed next to the  colon.  We stayed high to avoid the ureter.  Once I had freed this up  then I noticed a normal appearing colon at the rectosigmoid junction.  I  then divided the colon proximally at the distal two-thirds of the  transverse colon and at the rectosigmoid junction using the GIA stapler.  Staying close to the bowel wall I divided  the mesentery with a LigaSure.  I then marked the proximal specimen with suture and sent it to  pathology.   Following this I had adequate mobilization.  I then brought the  transverse colon down in a side-to-side fashion with the rectosigmoid  area.  I created a side-to-side stapled anastomosis.  The common defect  was closed with a linear noncutting stapler.  The crotch stitch of 3-0  silk was used.  The anastomosis was patent, viable, under no tension and  demonstrated no obvious leak.   Following this I copiously irrigated out the abdominal cavity with 3  liters of saline solution.  I saw no evidence of active bleeding and  evacuated the solution as much as possible.  I then removed the self  retaining retractor and requested a needle, sponge and instrument count  which at this time was reported to be correct.   The trocars were then removed.  I closed the midline fascia with a  running #1 PDS  suture.  The subcutaneous tissue was irrigated.  The skin  incision in the lower midline as well as the trocar site skin incisions  were then closed with staples.  Sterile dressings were applied.   She tolerated the procedure well without any apparent complications.  She was taken to the recovery room in satisfactory condition.      Adolph Pollack, M.D.  Electronically Signed     TJR/MEDQ  D:  10/20/2007  T:  10/21/2007  Job:  [id number]   cc:   Corwin Levins, MD  [address]  [place]  [place] [address]   Griffith Citron, M.D.  Fax: [phone]

## 2010-06-24 NOTE — Procedures (Signed)
NAMEKATHYE, [name]                 ACCOUNT NO.:  [id number]   MEDICAL RECORD NO.:  [id number]          PATIENT TYPE:  AMB   LOCATION:  SDS                          FACILITY:  [facility]   PHYSICIAN:  Nanetta Batty, M.D.   DATE OF BIRTH:  Sep 05, 1940   DATE OF PROCEDURE:  02/20/2010  DATE OF DISCHARGE:                    PERIPHERAL VASCULAR INVASIVE PROCEDURE    PROCEDURE:  Peripheral angiogram/percutaneous transluminal angioplasty  and stent placement.   OPERATOR:  Nanetta Batty, MD   INDICATIONS:  [name] is a 70 year old female patient of Dr. Fredirick Maudlin  referred for bilateral lower extremity claudication, right greater than  left with Dopplers to confirm this.  She has a history of  hyperlipidemia.  Her last nuclear study in 2008 was nonischemic.   TECHNIQUE:  The patient presented to the second floor [facility]  angiographic suite in a postabsorptive state.  She was premedicated with  p.o. Valium, IV Versed, and fentanyl.  Her left groin was prepped and  shaved in usual sterile fashion.  Xylocaine 1% was used for local  anesthesia.  A 5-French sheath was inserted into the left femoral artery  using standard Seldinger technique.  A 5-French tennis racket catheter  was used for midstream and distal abdominal aortography with bifemoral  runoff using bolus chase digital subtraction step-table technique.  Visipaque dye was used for the entirety of the case.  Retrograde aortic  pressures were monitored during the case.   ANGIOGRAPHIC RESULTS:  1. Abdominal aorta;      a.     Renal arteries - normal.      b.     Infrarenal abdominal aorta - normal.  2. Left lower extremity;      a.     90% focal mid left SFA stenosis with three-vessel runoff.  3. Right lower extremity;      a.     Tandem 80-90% mid right SFA stenosis with three-vessel       runoff.   DESCRIPTION OF PROCEDURE:  The patient received 4000 units of heparin  intravenously.  Contralateral access was obtained with  a 5-French  crossover catheter, Versacore wire, and 6-French Cook and angled  crossover sheath.  The patient received 4 baby aspirins chewable.  The  mid SFA lesion was crossed with Versacore wire and angioplasty was  performed with a 4 x 4 balloon.  This segment was then stented with a 6  x 12 Cordis Smart nitinol self-expanding stent and predilated with a 5 x  10 balloon resulting reduction of multiple 80-90% stenosis to 0%  residual.  The patient tolerated the procedure well.  The sheath was  then withdrawn across the bifurcation.  The ACT  was measured.  The patient left the lab in stable condition.  She will  be gently hydrated overnight and the sheath will be removed once the ACT  falls below 170.  She will be discharged home in the morning and will be  seen back in the office in 1 week for followup.      Nanetta Batty, M.D.      [name]  D:  02/20/2010  T:  02/21/2010  Job:  [id number]   cc:   Second Floor [facility]  [facility]  Gaspar Garbe B. Little, M.D.  Corwin Levins, MD   Electronically Signed by Nanetta Batty M.D. on 02/26/2010 05:42:00 PM

## 2010-06-24 NOTE — H&P (Signed)
[name], [name] NO.:  [id number]   MEDICAL RECORD NO.:  [id number]          PATIENT TYPE:  INP   LOCATION:  0004                         FACILITY:  [facility]   PHYSICIAN:  Adolph Pollack, M.D.DATE OF BIRTH:  30-Jul-1940   DATE OF ADMISSION:  10/20/2007  DATE OF DISCHARGE:                              HISTORY & PHYSICAL   REASON FOR ADMISSION:  Elective partial colectomy.   HISTORY OF PRESENT ILLNESS:  [name] is a  70 year old female who has  had recurrent diverticulitis.  Her last episode was in the spring when  she on her way to [place] and had episode in [place] requiring  hospitalization for about 6 days.  She subsequently came home and was  referred to me for discussion of elective partial colectomy.  She is  admitted for that.   PAST MEDICAL HISTORY:  1. Recurrent sigmoid diverticulitis.  2. Hypertension.  3. Arthritis.  4. Intermittent urinary incontinence.  5. Cervical spine disease.   PREVIOUS OPERATIONS:  1. Hysterectomy.  2. Cervical spine surgery.   ALLERGIES:  EXFORGE.  SHE ALSO THINKS HE MAY BE ALLERGIC TO A CERTAIN  TYPE OF ANESTHETIC, BUT CANNOT REMEMBER WHAT THAT IS.   CURRENT MEDICATIONS:  1. Celexa.  2. Lisinopril.  3. Metoprolol.  4. Etodolac.  5. Robaxin.  6. Vitamin B12.  7. Calcium and magnesium.  8. Vitamin C.   SOCIAL HISTORY:  No tobacco use.  Three to four alcoholic beverages a  week.  Lives in [place].   FAMILY HISTORY:  Notable for heart disease.  She had a brother who had a  stroke.   PHYSICAL EXAMINATION:  GENERAL:  A well-developed, well-nourished female  in no acute distress, pleasant and cooperative.  VITAL SIGNS:  Temperature 98.4 blood pressure is 174/90, pulse 60, room  air oxygen saturation 90%.  HEENT:  Eyes: External ocular motions intact.  No icterus.  NECK:  Has a well-healed posterior scar.  RESPIRATORY:  Breath sounds equal and clear.  Respirations unlabored.  CARDIOVASCULAR:   Regular rate, regular rhythm.  No murmur.  ABDOMEN:  Soft, nontender.  No masses and demonstrates a lower  transverse scar.  EXTREMITIES:  No edema and SCDs on lower extremities.   IMPRESSION:  Recurrent sigmoid diverticulitis.  Laparoscopic-assisted  possible open sigmoid colectomy.  Procedure and risks were discussed  with her preoperatively.      Adolph Pollack, M.D.  Electronically Signed     TJR/MEDQ  D:  10/20/2007  T:  10/20/2007  Job:  [id number]

## 2010-06-27 NOTE — Cardiovascular Report (Signed)
[place]  Patient:    [name], [name]                       MRN: [id number] Proc. Date: 09/17/99 Attending:  Thereasa Solo. Little, M.D. CC:         Richard A. Jacky Kindle, M.D.  [facility]   Cardiac Catheterization  INDICATIONS FOR TEST:  [name] is a 70 year old hypertensive female who presents with recurrent episodes of chest pain.  She has a history of mitral valve prolapse.  She has had a normal gallbladder ultrasound done within the last month and continues to have chest pain despite being on Protonix.  Her EKG showed new inferolateral T wave abnormalities and because of this, was brought to the cath lab for evaluation.  PROCEDURES 1. Left heart catheterization. 2. Selective right and left coronary arteriography. 3. Ventriculography in the right anterior oblique projection. 4. Aortic root injection at the level of the renal arteries to evaluate    possible renal artery stenosis as an etiology of her severe hypertension.  DESCRIPTION OF PROCEDURE:  Patient was prepped and draped in the usual sterile fashion, exposing the right groin.  Following local anesthetic with 1% Xylocaine, the Seldinger technique was employed and a 6-French introducer sheath was placed into the right femoral artery.  Selective right and left coronary arteriography and ventriculography in the RAO projection were performed.  A distal aortogram at the level of the renals was also performed.  COMPLICATIONS:  None.  RESULTS 1. Hemodynamic monitoring:  Central aortic pressure is 192/79.  Left    ventricular pressure was 193/17 with no aortic valve gradient noted at the    time of pullback.  The patient was given labetalol 40 mg IV during the    procedure. 2. Ventriculography:  Ventriculography in the RAO projection revealed normal    left ventricular systolic function.  There was mitral valve prolapse    without mitral regurgitation.  Ejection fraction  was greater than 60%.    End-diastolic pressure 17. 3. Coronary arteriography:    a. Left main:  Normal.    b. LAD:  The proximal third of the LAD had mild irregularities of about       20-30%.  The mid and distal portions were free of disease.    c. Optional diagonal:  The optional diagonal was a very large vessel, as       large as or slightly larger in diameter than the LAD and extending down       the entire lateral wall.  There were no obstructions noted.    d. Circumflex:  Small, free of disease.    e. Right coronary artery:   The right coronary artery gave rise to the PDA       and posterolateral branch.  This was a medium-size vessel about 2.75 in       diameter.  There were no obstructions noted. 4. Distal aortogram at the level of the renals using 20 cc of contrast at    20 cc/sec revealed no evidence of any renal artery stenosis.  CONCLUSION 1. Mild coronary artery disease involving the proximal portion of the left    anterior descending. 2. Mitral valve prolapse with mitral regurgitation. 3. No evidence of renal artery stenosis. 4. Hypertension.  PLAN:  Patient will be discharged today with followup in my office tomorrow for readjustment of her antihypertensive medications. DD:  09/17/99 TD:  09/17/99 Job: [id number]

## 2010-06-27 NOTE — Discharge Summary (Signed)
NAMEJIREH, VINAS NO.:  [id number]   MEDICAL RECORD NO.:  [id number]          PATIENT TYPE:  INP   LOCATION:  1524                         FACILITY:  [facility]   PHYSICIAN:  Adolph Pollack, M.D.DATE OF BIRTH:  11-Feb-1940   DATE OF ADMISSION:  10/20/2007  DATE OF DISCHARGE:  10/25/2007                               DISCHARGE SUMMARY   FINAL DISCHARGE DIAGNOSIS:  Recurrent acute diverticulitis.   SECONDARY DIAGNOSES:  1. Postoperative ileus.  2. Hypertension.   PROCEDURE:  Laparoscopic assisted left colectomy with mobilization of  splenic flexure.   HISTORY OF PRESENT ILLNESS:  This is a 70 year old female who has had  multiple bouts of recurrent diverticulitis involving the distal left and  sigmoid colon.  She now presents for elective partial colectomy.   HOSPITAL COURSE:  She underwent the above procedure, which she tolerated  fairly well.  Postoperatively, she had some mild acute blood loss anemia  with the hemoglobin dropping to 10.  She had good urine output.  She was  started on a clear liquid diet.  She had somewhat of a postop ileus, but  the diet was slowly advanced.  She had a little pink area around the  inferior aspect of the wound, but this did not develop into a frank  infection.  I restarted her antihypertensives on the fourth  postoperative day and slowly advanced her diet.  By her fifth  postoperative day, she was having flatus and bowel movements, tolerating  a diet was able to be discharged.   DISPOSITION:  Discharged to home on October 25, 2007.  She will come  back to the office for staple removal on October 28, 2007, at 9:00  a.m.  She was given discharge instructions and pain medication and was  in satisfactory condition.      Adolph Pollack, M.D.  Electronically Signed     TJR/MEDQ  D:  11/09/2007  T:  11/09/2007  Job:  [id number]   cc:   Griffith Citron, M.D.  Fax: [phone]   Corwin Levins, MD  [address]  [place]  [place] [address]

## 2010-06-27 NOTE — Op Note (Signed)
[name], [name]                 ACCOUNT NO.:  [id number]   MEDICAL RECORD NO.:  [id number]          PATIENT TYPE:  AMB   LOCATION:  DSC                          FACILITY:  [facility]   PHYSICIAN:  Dionne Ano. Gramig III, M.D.DATE OF BIRTH:  01/09/1941   DATE OF PROCEDURE:  03/25/2006  DATE OF DISCHARGE:                               OPERATIVE REPORT   PREOPERATIVE DIAGNOSIS:  1. Right carpal tunnel syndrome.  2. Right carpal metacarpal joint degenerative disease with failure of      conservative management about the base of the thumb.   POSTOPERATIVE DIAGNOSIS:  1. Right carpal tunnel syndrome.  2. Right carpal metacarpal joint degenerative disease with failure of      conservative management about the base of the thumb.   PROCEDURE:  1. Right open carpal tunnel release.  2. Right carpal metacarpal arthroplasty (removal of the trapezium at      the base of the thumb joint), right thumb.  3. Abductor pollicis longus digastric portion tendon transfer to the      first metacarpal FCR and back upon the digastric and APL proper      tendons (Zancolli tendon transfer), right base of thumb joint.  4. Abductor pollicis longus 1/3 proper portion tendon transfer to the      FCR/APL proper and back upon themselves with multiple figure-of-      eight throws (Weilby tendon transfer right base of thumb joint).  5. Abductor pollicis longus shortening slice tenodesis (shortening of      a wrist extensor at the wrist level).   SURGEON:  Dionne Ano. Amanda Pea, M.D.   ASSISTANT:  Karie Chimera, P.A.-C.   COMPLICATIONS:  None.   ANESTHESIA:  General with preoperative block.   TOURNIQUET TIME:  Less than an hour.   ESTIMATED BLOOD LOSS:  Minimal.   INDICATIONS FOR PROCEDURE:  This patient is a 70 year old female who  presents with the above mentioned diagnosis.  I have counseled her in  regards to the risks and benefits of surgery including the risks of  infection, bleeding, anesthesia, damage  to normal structures, and  failure of surgery to accomplish its intended goals of relieving  symptoms and restoring function.  With this in mind, she desires to  proceed.  All questions have been encouraged and answered  preoperatively.   OPERATIVE PROCEDURE IN DETAIL:  The patient was seen by myself and  anesthesia and taken to the operating suite.  The permit was signed.  The arm was marked.  All questions were encouraged and answered.  The  patient then underwent a general anesthetic.  Following this, the  patient was laid supine, prepped and draped in the usual sterile fashion  about the right upper extremity.   The operation commenced with insufflation of the tourniquet to 250 mmHg.  A 1.5 cm incision was made at the transverse carpal ligament.  Dissection was carried down, the palmar fascia incised. The patient had  tissue retracted and distal edges of the transverse carpal ligament was  released under 4 loupe magnification under direct vision.  I then  dissected in a distal to proximal fashion and released the transverse  carpal ligament off of the ulnar ledge carefully protecting the  superficial palmar arch and the median nerve.  The patient tolerated  this well.  Blunt tip scissors were used to release the proximal leading  leaflet and portions of the antebrachial fascia.  The nerve was  protected at all times.   Following this, the patient then underwent a very smooth transition to a  dorsal radial incision about the thumb.  Dissection was carried down  through skin.  The interval between the EPB and APL was created, the  superficial radial nerve was identified and kept intact.  The patient  had radial artery exploration performed under 4 loupe magnification.  I  then removed the trapezium piecemeal by incising the capsule, elevating  it, removing any loose bodies as well as the trapezium in its entirety.  I then completed a FCR tenolysis and tenosynovectomy and freeing  of  adhesions.  Following this, I made a drill hole dorsal to palmar exiting  intra-articularly in line with the palmar beak ligament.  This was  enlarged to a 3/2 drill bit.  Following this, I irrigated copiously and  this completed the arthroplasty portion of the procedure.   Following this, a counter incision was made at the distal third of the  forearm.  Dissection was carried down.  The APL digastric portion and  the 1/3 proper portion of the APL proper were harvested.  These were  brought through the main incision. This area was copiously irrigated and  closed and the superficial radial nerve was protected.   Following this, I then performed a APL digastric portion tendon transfer  to the first metacarpal via the drill hole dorsal to palmar en route.  Following this, it exited intra-articularly in line with the beak  ligament.  It was placed around the FCR twice through a slit and then  back upon itself in the APL proper with 3-0 FiberWire.  This completed  the Zancolli tendon transfer.   Following this, I then transferred the APL 1/3 proper portion to the FCR  and placed this back upon the APL proper and the FCR with multiple  figure-of-eight throws.  This was inset with 3-0 FiberWire suture and  this completed the Weilby tendon transfer.   Following this, I then irrigated copiously and performed a complex APL  shortening with FiberWire suture.  The capsule was then closed without  difficulty with a reinforcing stitch.  The thumb sat beautifully and  nicely positioned.  The APL tendon was shortened without complicating  feature.   Following shortening of the APL tendon and capsular closure, I then  performed deflation of the tourniquet, closure of the wound with  Prolene, and placed her in a dressing and thumb spica splint.  She  tolerated the procedure well.   She will be monitored overnight as a 23-hour observation stay, for IV antibiotics, pain medicine according to her  needs, and general postop  observation.  I have discussed with the patient the dos and donts, etc.,  and plans for  follow-up.  We will see her back in the office in ten days, place a cast  on her, and we will keep her casted for six weeks approximately.  All  questions have been encouraged and answered.  She tolerated the  procedure well and it has been an absolute pleasure to participate in  her care.  ______________________________  Dionne Ano. Everlene Other, M.D.     [name]  D:  03/25/2006  T:  03/25/2006  Job:  [id number]

## 2010-07-01 ENCOUNTER — Ambulatory Visit: Payer: Self-pay | Admitting: Internal Medicine

## 2010-11-12 LAB — COMPREHENSIVE METABOLIC PANEL
ALT: 13
AST: 20
Albumin: 4
Alkaline Phosphatase: 119 — ABNORMAL HIGH
BUN: 9
CO2: 31
Calcium: 10.7 — ABNORMAL HIGH
Chloride: 103
Creatinine, Ser: 0.67
GFR calc Af Amer: >60
GFR calc non Af Amer: >60
Glucose, Bld: 111 — ABNORMAL HIGH
Potassium: 4.8
Sodium: 137
Total Bilirubin: 1
Total Protein: 6.8

## 2010-11-12 LAB — BASIC METABOLIC PANEL
BUN: 2 — ABNORMAL LOW
BUN: 3 — ABNORMAL LOW
BUN: 8
CO2: 27
CO2: 30
CO2: 31
Calcium: 10.1
Calcium: 10.2
Calcium: 8.7
Chloride: 105
Chloride: 108
Chloride: 109
Creatinine, Ser: 0.58
Creatinine, Ser: 0.65
Creatinine, Ser: 0.78
GFR calc Af Amer: >60
GFR calc Af Amer: >60
GFR calc Af Amer: >60
GFR calc non Af Amer: >60
GFR calc non Af Amer: >60
GFR calc non Af Amer: >60
Glucose, Bld: 123 — ABNORMAL HIGH
Glucose, Bld: 126 — ABNORMAL HIGH
Glucose, Bld: 137 — ABNORMAL HIGH
Potassium: 4
Potassium: 4.4
Potassium: 4.6
Sodium: 138
Sodium: 138
Sodium: 141

## 2010-11-12 LAB — CBC
HCT: 29.7 — ABNORMAL LOW
HCT: 29.9 — ABNORMAL LOW
HCT: 30.3 — ABNORMAL LOW
HCT: 40.7
Hemoglobin: 10 — ABNORMAL LOW
Hemoglobin: 10.2 — ABNORMAL LOW
Hemoglobin: 10.3 — ABNORMAL LOW
Hemoglobin: 13.6
MCHC: 33.4
MCHC: 33.8
MCHC: 33.9
MCHC: 34
MCV: 88.9
MCV: 89.7
MCV: 89.7
MCV: 90.2
Platelets: 209
Platelets: 235
Platelets: 257
Platelets: 264
RBC: 3.29 — ABNORMAL LOW
RBC: 3.33 — ABNORMAL LOW
RBC: 3.4 — ABNORMAL LOW
RBC: 4.53
RDW: 13.4
RDW: 13.6
RDW: 13.8
RDW: 13.8
WBC: 6.7
WBC: 7
WBC: 7.6
WBC: 8.8

## 2010-11-12 LAB — DIFFERENTIAL
Basophils Absolute: 0
Basophils Relative: 1
Eosinophils Absolute: 0.1
Eosinophils Relative: 2
Lymphocytes Relative: 33
Lymphs Abs: 2.2
Monocytes Absolute: 0.6
Monocytes Relative: 8
Neutro Abs: 3.8
Neutrophils Relative %: 56

## 2010-11-12 LAB — TYPE AND SCREEN
ABO/RH(D): A POS
Antibody Screen: NEGATIVE

## 2010-11-12 LAB — PROTIME-INR
INR: 0.9
Prothrombin Time: 12.3

## 2010-11-12 LAB — ABO/RH: ABO/RH(D): A POS

## 2011-10-28 HISTORY — PX: OTHER SURGICAL HISTORY: SHX169

## 2011-11-17 ENCOUNTER — Ambulatory Visit: Payer: Medicare Other

## 2011-11-17 ENCOUNTER — Ambulatory Visit (INDEPENDENT_AMBULATORY_CARE_PROVIDER_SITE_OTHER): Payer: Medicare Other | Admitting: Family Medicine

## 2011-11-17 VITALS — BP 124/70 | HR 67 | Temp 98.2°F | Resp 18 | Ht 64.5 in | Wt 147.0 lb

## 2011-11-17 DIAGNOSIS — R0600 Dyspnea, unspecified: Secondary | ICD-10-CM

## 2011-11-17 DIAGNOSIS — R5381 Other malaise: Secondary | ICD-10-CM

## 2011-11-17 DIAGNOSIS — R5383 Other fatigue: Secondary | ICD-10-CM

## 2011-11-17 DIAGNOSIS — R0989 Other specified symptoms and signs involving the circulatory and respiratory systems: Secondary | ICD-10-CM

## 2011-11-17 DIAGNOSIS — Z23 Encounter for immunization: Secondary | ICD-10-CM

## 2011-11-17 DIAGNOSIS — R0609 Other forms of dyspnea: Secondary | ICD-10-CM

## 2011-11-17 DIAGNOSIS — R0789 Other chest pain: Secondary | ICD-10-CM

## 2011-11-17 LAB — POCT CBC
Granulocyte percent: 56.7 %G (ref 37–80)
HCT, POC: 44.5 % (ref 37.7–47.9)
Hemoglobin: 14.1 g/dL (ref 12.2–16.2)
Lymph, poc: 2.1 (ref 0.6–3.4)
MCH, POC: 30 pg (ref 27–31.2)
MCHC: 31.7 g/dL — AB (ref 31.8–35.4)
MCV: 94.6 fL (ref 80–97)
MID (cbc): 0.9 (ref 0–0.9)
MPV: 9.1 fL (ref 0–99.8)
POC Granulocyte: 4 (ref 2–6.9)
POC LYMPH PERCENT: 29.9 %L (ref 10–50)
POC MID %: 13.4 %M — AB (ref 0–12)
Platelet Count, POC: 353 10*3/uL (ref 142–424)
RBC: 4.7 M/uL (ref 4.04–5.48)
RDW, POC: 12.3 %
WBC: 7 10*3/uL (ref 4.6–10.2)

## 2011-11-17 LAB — GLUCOSE, POCT (MANUAL RESULT ENTRY): POC Glucose: 115 mg/dl — AB (ref 70–99)

## 2011-11-17 NOTE — Progress Notes (Signed)
Subjective: 71 year old lady who started feeling bad about Saturday. She was beach with her husband and some of his relatives. They've been fishing. She did not feel very well, but. Saturday she just laid around all day because she did not feel well. Felt like she had no energy. Extremely fatigued. She had a sense of shortness of breath, though not gasping for air. He just felt like a heaviness in her breathing and hard to get a good breath to carry on a conversation. She had some chest heaviness but not quite as full chest pain. She did not have any nausea or vomiting. Appetite was fair. Sunday she felt bad still but was well enough to drive home from the beach. She continues to feel very blah the last couple of days with the same kind of symptoms as described above. Does not seem to be getting a lot worse, but this is not feeling well.  Objective: Alert oriented lady in no acute distress. Her O2 saturation was only 95%, him minimally lower than desired. Her TMs are normal. Throat clear. Neck supple without nodes. Chest clear. Heart regular without murmurs. And soft mass or tenderness. No arrhythmias were noted. Skin is warm and dry. No CVA tenderness.  Assessment: Fatigue Shortness of breath Chest discomfort  Plan: Check CBC blood chemistries and blood sugar on her. Do a chest x-ray and EKG. Discussed with you.  Results for orders placed in visit on 11/17/11  POCT CBC      Component Value Range   WBC 7.0  4.6 - 10.2 K/uL   Lymph, poc 2.1  0.6 - 3.4   POC LYMPH PERCENT 29.9  10 - 50 %L   MID (cbc) 0.9  0 - 0.9   POC MID % 13.4 (*) 0 - 12 %M   POC Granulocyte 4.0  2 - 6.9   Granulocyte percent 56.7  37 - 80 %G   RBC 4.70  4.04 - 5.48 M/uL   Hemoglobin 14.1  12.2 - 16.2 g/dL   HCT, POC 40.9  81.1 - 47.9 %   MCV 94.6  80 - 97 fL   MCH, POC 30.0  27 - 31.2 pg   MCHC 31.7 (*) 31.8 - 35.4 g/dL   RDW, POC 91.4     Platelet Count, POC 353  142 - 424 K/uL   MPV 9.1  0 - 99.8 fL  GLUCOSE,  POCT (MANUAL RESULT ENTRY)      Component Value Range   POC Glucose 115 (*) 70 - 99 mg/dl   [facility] reading (PRIMARY) by  Dr. Alwyn Ren Normal chest.  Breast implant.    Told her that I cannot find the cause of her feeling this way. With a slow heart rate she should see her cardiologist back. Otherwise just give it time. Return if worse.

## 2011-11-18 ENCOUNTER — Encounter: Payer: Self-pay | Admitting: Family Medicine

## 2011-11-18 LAB — COMPREHENSIVE METABOLIC PANEL
ALT: 15 U/L (ref 0–35)
AST: 20 U/L (ref 0–37)
Albumin: 4.2 g/dL (ref 3.5–5.2)
Alkaline Phosphatase: 110 U/L (ref 39–117)
BUN: 16 mg/dL (ref 6–23)
CO2: 30 mEq/L (ref 19–32)
Calcium: 11.1 mg/dL — ABNORMAL HIGH (ref 8.4–10.5)
Chloride: 104 mEq/L (ref 96–112)
Creat: 1.05 mg/dL (ref 0.50–1.10)
Glucose, Bld: 116 mg/dL — ABNORMAL HIGH (ref 70–99)
Potassium: 5.4 mEq/L — ABNORMAL HIGH (ref 3.5–5.3)
Sodium: 142 mEq/L (ref 135–145)
Total Bilirubin: 0.4 mg/dL (ref 0.3–1.2)
Total Protein: 6.6 g/dL (ref 6.0–8.3)

## 2011-11-19 ENCOUNTER — Telehealth: Payer: Self-pay

## 2011-11-19 NOTE — Telephone Encounter (Signed)
Faxed over labs per request.

## 2011-11-19 NOTE — Telephone Encounter (Signed)
WAYMON STATES HE WOULD LIKE [place] TO FAX HIS WIFE'S LABS TO THEM SO SHE CAN SEND TO HER OTHER DR'S. PLEASE CALL PT AT [phone] AND THE FAX IS [phone]

## 2012-01-15 ENCOUNTER — Telehealth: Payer: Self-pay

## 2012-01-15 NOTE — Telephone Encounter (Signed)
Chest Xray sent thru Epic.

## 2012-01-15 NOTE — Telephone Encounter (Signed)
DENISE FROM [facility] STATES THEY ARE IN NEED OF A CHEST XRAY ON PT. PLEASE FAX TO [phone] AND THE PHONE NUMBER IS [phone]   PT IS IN OFFICE NOW                    PT IS IN OFFICE NOW

## 2012-01-18 ENCOUNTER — Encounter (HOSPITAL_COMMUNITY): Payer: Self-pay | Admitting: Pharmacy Technician

## 2012-01-20 ENCOUNTER — Other Ambulatory Visit: Payer: Self-pay | Admitting: Cardiovascular Disease

## 2012-01-20 MED ORDER — CEFAZOLIN SODIUM-DEXTROSE 2-3 GM-% IV SOLR
2.0000 g | INTRAVENOUS | Status: DC
  Filled 2012-01-20: qty 50

## 2012-01-20 MED ORDER — SODIUM CHLORIDE 0.9 % IR SOLN
80.0000 mg | Status: DC
  Filled 2012-01-20: qty 2

## 2012-01-21 ENCOUNTER — Encounter (HOSPITAL_COMMUNITY)
Admission: RE | Disposition: A | Discharge status: Home / Self Care | Payer: Self-pay | Source: Ambulatory Visit | Attending: Cardiovascular Disease

## 2012-01-21 ENCOUNTER — Encounter (HOSPITAL_COMMUNITY): Payer: Self-pay | Admitting: General Practice

## 2012-01-21 ENCOUNTER — Ambulatory Visit (HOSPITAL_COMMUNITY)
Admission: RE | Admit: 2012-01-21 | Discharge: 2012-01-22 | Disposition: A | Discharge status: Home / Self Care | Payer: Medicare Other | Source: Ambulatory Visit | Attending: Cardiovascular Disease | Admitting: Cardiovascular Disease

## 2012-01-21 DIAGNOSIS — R001 Bradycardia, unspecified: Secondary | ICD-10-CM | POA: Diagnosis present

## 2012-01-21 DIAGNOSIS — Z950 Presence of cardiac pacemaker: Secondary | ICD-10-CM

## 2012-01-21 DIAGNOSIS — I480 Paroxysmal atrial fibrillation: Secondary | ICD-10-CM | POA: Diagnosis present

## 2012-01-21 DIAGNOSIS — I739 Peripheral vascular disease, unspecified: Secondary | ICD-10-CM | POA: Diagnosis present

## 2012-01-21 DIAGNOSIS — I080 Rheumatic disorders of both mitral and aortic valves: Secondary | ICD-10-CM | POA: Diagnosis present

## 2012-01-21 DIAGNOSIS — I059 Rheumatic mitral valve disease, unspecified: Secondary | ICD-10-CM | POA: Insufficient documentation

## 2012-01-21 DIAGNOSIS — Z23 Encounter for immunization: Secondary | ICD-10-CM | POA: Insufficient documentation

## 2012-01-21 DIAGNOSIS — I4891 Unspecified atrial fibrillation: Secondary | ICD-10-CM | POA: Insufficient documentation

## 2012-01-21 DIAGNOSIS — I2510 Atherosclerotic heart disease of native coronary artery without angina pectoris: Secondary | ICD-10-CM | POA: Insufficient documentation

## 2012-01-21 DIAGNOSIS — I495 Sick sinus syndrome: Secondary | ICD-10-CM | POA: Insufficient documentation

## 2012-01-21 HISTORY — DX: Presence of cardiac pacemaker: Z95.0

## 2012-01-21 HISTORY — DX: Bradycardia, unspecified: R00.1

## 2012-01-21 HISTORY — DX: Peripheral vascular disease, unspecified: I73.9

## 2012-01-21 HISTORY — PX: PERMANENT PACEMAKER INSERTION: SHX5480

## 2012-01-21 LAB — SURGICAL PCR SCREEN
MRSA, PCR: NEGATIVE
Staphylococcus aureus: NEGATIVE

## 2012-01-21 SURGERY — PERMANENT PACEMAKER INSERTION
Anesthesia: LOCAL

## 2012-01-21 MED ORDER — ONDANSETRON HCL 4 MG/2ML IJ SOLN: 4.0000 mg | Freq: Four times a day (QID) | INTRAMUSCULAR | Status: DC | PRN

## 2012-01-21 MED ORDER — LOSARTAN POTASSIUM 50 MG PO TABS
100.0000 mg | ORAL_TABLET | Freq: Every day | ORAL | Status: DC
  Filled 2012-01-21: qty 2

## 2012-01-21 MED ORDER — YOU HAVE A PACEMAKER BOOK
Freq: Once | Status: AC
  Administered 2012-01-21: 23:00:00
  Filled 2012-01-21: qty 1

## 2012-01-21 MED ORDER — SODIUM CHLORIDE 0.9 % IV SOLN: 250.0000 mL | INTRAVENOUS | Status: DC | PRN

## 2012-01-21 MED ORDER — CHLORHEXIDINE GLUCONATE 4 % EX LIQD
60.0000 mL | Freq: Once | CUTANEOUS | Status: DC
  Filled 2012-01-21: qty 60

## 2012-01-21 MED ORDER — SODIUM CHLORIDE 0.9 % IJ SOLN: 3.0000 mL | Freq: Two times a day (BID) | INTRAMUSCULAR | Status: DC

## 2012-01-21 MED ORDER — LIDOCAINE HCL (PF) 1 % IJ SOLN
INTRAMUSCULAR | Status: AC
  Filled 2012-01-21: qty 60

## 2012-01-21 MED ORDER — SODIUM CHLORIDE 0.9 % IJ SOLN: 3.0000 mL | INTRAMUSCULAR | Status: DC | PRN

## 2012-01-21 MED ORDER — MIDAZOLAM HCL 2 MG/2ML IJ SOLN
INTRAMUSCULAR | Status: AC
  Filled 2012-01-21: qty 2

## 2012-01-21 MED ORDER — MUPIROCIN 2 % EX OINT
TOPICAL_OINTMENT | CUTANEOUS | Status: AC
  Filled 2012-01-21: qty 22

## 2012-01-21 MED ORDER — MIDAZOLAM HCL 2 MG/2ML IJ SOLN
INTRAMUSCULAR | Status: AC
  Filled 2012-01-21: qty 4

## 2012-01-21 MED ORDER — SODIUM CHLORIDE 0.45 % IV SOLN
INTRAVENOUS | Status: DC
  Administered 2012-01-21: 10:00:00 via INTRAVENOUS

## 2012-01-21 MED ORDER — CITALOPRAM HYDROBROMIDE 20 MG PO TABS
20.0000 mg | ORAL_TABLET | Freq: Every day | ORAL | Status: DC
  Administered 2012-01-22: 10:00:00 20 mg via ORAL
  Filled 2012-01-21: qty 1

## 2012-01-21 MED ORDER — CEFAZOLIN SODIUM 1-5 GM-% IV SOLN
1.0000 g | Freq: Four times a day (QID) | INTRAVENOUS | Status: AC
  Administered 2012-01-21 – 2012-01-22 (×3): 1 g via INTRAVENOUS
  Filled 2012-01-21 (×3): qty 50

## 2012-01-21 MED ORDER — FENTANYL CITRATE 0.05 MG/ML IJ SOLN
INTRAMUSCULAR | Status: AC
  Filled 2012-01-21: qty 2

## 2012-01-21 MED ORDER — ASPIRIN 81 MG PO TBEC
162.0000 mg | DELAYED_RELEASE_TABLET | Freq: Every day | ORAL | Status: DC
Start: 2012-01-21 — End: 2012-01-21

## 2012-01-21 MED ORDER — LOSARTAN POTASSIUM 50 MG PO TABS: 100.0000 mg | ORAL_TABLET | Freq: Every day | ORAL | Status: DC

## 2012-01-21 MED ORDER — HYDROCODONE-ACETAMINOPHEN 5-325 MG PO TABS
1.0000 | ORAL_TABLET | ORAL | Status: DC | PRN
  Administered 2012-01-21 (×2): 1 via ORAL
  Filled 2012-01-21 (×2): qty 1

## 2012-01-21 MED ORDER — MUPIROCIN 2 % EX OINT
TOPICAL_OINTMENT | Freq: Two times a day (BID) | CUTANEOUS | Status: DC
  Administered 2012-01-21: 1 via NASAL
  Filled 2012-01-21: qty 22

## 2012-01-21 MED ORDER — ASPIRIN EC 81 MG PO TBEC
162.0000 mg | DELAYED_RELEASE_TABLET | Freq: Every day | ORAL | Status: DC
  Administered 2012-01-21 – 2012-01-22 (×2): 162 mg via ORAL
  Filled 2012-01-21 (×2): qty 2

## 2012-01-21 MED ORDER — AMLODIPINE BESYLATE 5 MG PO TABS
5.0000 mg | ORAL_TABLET | Freq: Every day | ORAL | Status: DC
  Administered 2012-01-22: 5 mg via ORAL
  Filled 2012-01-21: qty 1

## 2012-01-21 MED ORDER — CEFAZOLIN SODIUM-DEXTROSE 2-3 GM-% IV SOLR
INTRAVENOUS | Status: AC
  Filled 2012-01-21: qty 50

## 2012-01-21 MED ORDER — ACETAMINOPHEN 325 MG PO TABS
325.0000 mg | ORAL_TABLET | ORAL | Status: DC | PRN
  Administered 2012-01-21: 650 mg via ORAL
  Filled 2012-01-21: qty 2

## 2012-01-21 MED ORDER — SODIUM CHLORIDE 0.9 % IV SOLN
INTRAVENOUS | Status: AC
  Administered 2012-01-21: 12:00:00 via INTRAVENOUS

## 2012-01-21 MED ORDER — PNEUMOCOCCAL VAC POLYVALENT 25 MCG/0.5ML IJ INJ
0.5000 mL | INJECTION | INTRAMUSCULAR | Status: AC
  Administered 2012-01-22: 10:00:00 0.5 mL via INTRAMUSCULAR
  Filled 2012-01-21: qty 0.5

## 2012-01-21 MED ORDER — HEPARIN (PORCINE) IN NACL 2-0.9 UNIT/ML-% IJ SOLN
INTRAMUSCULAR | Status: AC
  Filled 2012-01-21: qty 500

## 2012-01-21 NOTE — Progress Notes (Signed)
Orthopedic Tech Progress Note Patient Details:  [name] 01/02/1941 [id number] Patient has arm sling Patient ID: ARLI BREE, female   DOB: Oct 07, 1940, 71 y.o.   MRN: [id number]   Orie Rout 01/21/2012, 2:47 PM

## 2012-01-21 NOTE — Progress Notes (Signed)
Utilization Review Completed.   Joann Jorge, RN, BSN Nurse Case Manager  [phone]  

## 2012-01-21 NOTE — CV Procedure (Signed)
[name],[name] Female, 71 y.o., January 30, 1941  Location: [facility]  MRN: [id number]  CSN: [id number]  Admit Dt: 01/21/12   Procedure report  Procedure performed: 1. Implantation of new dual chamber permanent pacemaker 2. Fluoroscopy 3. Light sedation  Reason for procedure: Symptomatic bradycardia due to: Sinus node dysfunction Tachycardia-bradycardia syndrome Bradycardia due to necessary medications  Procedure performed by: Thurmon Fair, MD  Complications: None  Estimated blood loss: <10 mL  Medications administered during procedure: Ancef 2 g intravenously Vancomycin 1 g intravenously Lidocaine 1% 30 mL locally,  Fentanyl 50 mcg intravenously Versed 3 mg intravenously  Device details: Generator Medtronic Adapta model ADDRL1 serial number [id number]  Right atrial lead Medtronic [id number] serial number [id number] Right ventricular lead Medtronic 5076 serial number [id number]  Procedure details:  After the risks and benefits of the procedure were discussed the patient provided informed consent and was brought to the cardiac cath lab in the fasting state. The patient was prepped and draped in usual sterile fashion. Local anesthesia with 1% lidocaine was administered to to the left infraclavicular area. A 5-6 cm horizontal incision was made parallel with and 2-3 cm caudal to the left clavicle. Using electrocautery and blunt dissection a prepectoral pocket was created down to the level of the pectoralis major muscle fascia. The pocket was carefully inspected for hemostasis. An antibiotic-soaked sponge was placed in the pocket.  Under fluoroscopic guidance and using the modified Seldinger technique 2 separate venipunctures were performed to access the left subclavian vein. No difficulty was encountered accessing the vein.  Two J-tip guidewires were subsequently exchanged for two 7 French safe sheaths.  Under fluoroscopic guidance the ventricular lead was advanced to level of the  mid to apical right ventricular septum and thet active-fixation helix was deployed. Prominent current of injury was seen. Satisfactory pacing and sensing parameters were recorded. There was no evidence of diaphragmatic stimulation at maximum device output. The safe sheath was peeled away and the lead was secured in place with 2-0 silk.  In similar fashion the right atrial lead was advanced to the level of the atrial appendage. The active-fixation helix was deployed. There was prominent current of injury. Satisfactory  pacing and sensing parameters were recorded. There was no evidence of diaphragmatic stimulation with pacing at maximum device output. The safe sheath was peeled away and the lead was secured in place with 2-0 silk.  The antibiotic-soaked sponge was removed from the pocket. The pocket was flushed with copious amounts of antibiotic solution. Reinspection showed excellent hemostasis..  The ventricular lead was connected to the generator and appropriate ventricular pacing was seen. Subsequently the atrial lead was also connected. Repeat testing of the lead parameters later showed excellent values.  The entire system was then carefully inserted in the pocket with care been taking that the leads and device assumed a comfortable position without pressure on the incision. Great care was taken that the leads be located deep to the generator. The pocket was then closed in layers using 2 layers of 2-0 Vicryl and cutaneous staples, after which a sterile dressing was applied.  At the end of the procedure the following lead parameters were encountered:  Right atrial lead  sensed P waves 2.5, impedance 650 ohm, threshold 1.4 V at 0.5 ms pulse width.  Right ventricular lead sensed R waves 12.1 mV, impedance 1078 ohm, threshold 0.7 V at 0.5 ms pulse width.  Thurmon Fair, MD, [facility] [phone] office [phone] pager 01/21/2012 12:15 PM  Cc:

## 2012-01-21 NOTE — H&P (Signed)
Date of Initial H&P: 01/15/2012  History reviewed, patient examined, no change in status, stable for surgery. Here for dual chamber pacemaker implantation due to symptomatic sinus bradycardia, tachycardia-bradycardia syndrome and necessary medications for paroxysmal atrial fibrillation. This procedure has been fully reviewed with the patient and written informed consent has been obtained. Thurmon Fair, MD, [facility] [phone] office [phone] pager 01/21/2012

## 2012-01-22 ENCOUNTER — Ambulatory Visit (HOSPITAL_COMMUNITY): Payer: Medicare Other

## 2012-01-22 ENCOUNTER — Encounter (HOSPITAL_COMMUNITY): Payer: Self-pay | Admitting: Cardiology

## 2012-01-22 DIAGNOSIS — I4819 Other persistent atrial fibrillation: Secondary | ICD-10-CM

## 2012-01-22 DIAGNOSIS — Z950 Presence of cardiac pacemaker: Secondary | ICD-10-CM | POA: Diagnosis not present

## 2012-01-22 DIAGNOSIS — R001 Bradycardia, unspecified: Secondary | ICD-10-CM

## 2012-01-22 DIAGNOSIS — I739 Peripheral vascular disease, unspecified: Secondary | ICD-10-CM | POA: Diagnosis present

## 2012-01-22 DIAGNOSIS — I495 Sick sinus syndrome: Secondary | ICD-10-CM | POA: Diagnosis present

## 2012-01-22 DIAGNOSIS — I480 Paroxysmal atrial fibrillation: Secondary | ICD-10-CM | POA: Diagnosis present

## 2012-01-22 HISTORY — DX: Bradycardia, unspecified: R00.1

## 2012-01-22 HISTORY — DX: Peripheral vascular disease, unspecified: I73.9

## 2012-01-22 HISTORY — DX: Other persistent atrial fibrillation: I48.19

## 2012-01-22 MED ORDER — RIVAROXABAN 20 MG PO TABS: 20.0000 mg | ORAL_TABLET | Freq: Every day | ORAL | Status: DC

## 2012-01-22 MED ORDER — METOPROLOL SUCCINATE ER 25 MG PO TB24: 25.0000 mg | ORAL_TABLET | Freq: Every day | ORAL | Status: DC

## 2012-01-22 MED ORDER — METOPROLOL SUCCINATE ER 25 MG PO TB24
25.0000 mg | ORAL_TABLET | Freq: Every day | ORAL | Status: DC
  Administered 2012-01-22: 10:00:00 25 mg via ORAL
  Filled 2012-01-22: qty 1

## 2012-01-22 MED ORDER — LOSARTAN POTASSIUM 50 MG PO TABS
50.0000 mg | ORAL_TABLET | Freq: Every day | ORAL | Status: DC
  Administered 2012-01-22: 10:00:00 50 mg via ORAL
  Filled 2012-01-22: qty 1

## 2012-01-22 MED ORDER — LOSARTAN POTASSIUM 50 MG PO TABS: 50.0000 mg | ORAL_TABLET | Freq: Every day | ORAL | Status: DC

## 2012-01-22 MED ORDER — HYDROCODONE-ACETAMINOPHEN 5-325 MG PO TABS: 1.0000 | ORAL_TABLET | ORAL | Status: DC | PRN

## 2012-01-22 NOTE — Progress Notes (Signed)
Subjective: No complaints, minimal incision discomfort  Objective: Vital signs in last 24 hours: Temp:  [97.4 F (36.3 C)-98.2 F (36.8 C)] 97.6 F (36.4 C) (12/13 0545) Pulse Rate:  [49-62] 62  (12/13 0545) Resp:  [13-18] 15  (12/13 0545) BP: (90-152)/(55-84) 90/60 mmHg (12/13 0545) SpO2:  [94 %-99 %] 95 % (12/13 0545) Weight:  [65.772 kg (145 lb)-66.2 kg (145 lb 15.1 oz)] 66.2 kg (145 lb 15.1 oz) (12/13 0001) Weight change:  Last BM Date: 01/20/12 Intake/Output from previous day: -360 12/12 0701 - 12/13 0700 In: 240 [P.O.:240] Out: 600 [Urine:600] Intake/Output this shift:    PE: General:alert and oriented, pleasant affect Heart:S1S2 RRR Lungs:clear no rales no wheezes Abd:+ BS soft non tender Ext:no edema  Atrial p wave 2.0; imp524  0.5v@ .4 ms Rwavws 15mv; imp 660th  5v @ .4ms  V pacing 2% with  mVP   EKG: Orders placed during the hospital encounter of 01/21/12  . EKG 12-LEAD  . EKG 12-LEAD    Studies/Results: Procedures: 01/21/12--Generator Medtronic Adapta model ADDRL1 serial number [id number]  Right atrial lead Medtronic [id number] serial number [id number]  Right ventricular lead Medtronic 5076 serial number [id number]     Medications: I have reviewed the patient's current medications.    Marland Kitchen amLODipine  5 mg Oral Daily  . aspirin EC  162 mg Oral Daily  .  ceFAZolin (ANCEF) IV  1 g Intravenous Q6H  . citalopram  20 mg Oral Daily  . losartan  100 mg Oral Daily  . pneumococcal 23 valent vaccine  0.5 mL Intramuscular Tomorrow-1000  . you have a pacemaker book   Does not apply Once   Assessment/Plan: Principal Problem:  *Symptomatic sinus bradycardia Active Problems:  PAF (paroxysmal atrial fibrillation)  Sinus node dysfunction  S/P placement of cardiac pacemaker, medtronic adapta 01/21/12  MITRAL REGURGITATION  CORONARY ARTERY DISEASE, nonobstructive at cath 2001 20% LAD, negative Nuc stress 2012  MITRAL VALVE PROLAPSE  PVD (peripheral vascular  disease), hx stents to bil SFAs 02/2010  PLAN: CXR stable without pneumothorax.   EKG atrial pacing with Ventricular sensing.  Will ambulate and D/C home.  Pacer site check in 7-10 days with extender?  Anticoagulation for CHADS score 2-- med to be decided.   LOS: 1 day   INGOLD,LAURA R 01/22/2012, 8:31 AM   I have seen and examined the patient along with [facility] R, NP.  I have reviewed the chart, notes and new data.  I agree with NP's note.  Excellent lead parameters. CXR shows borderline amount of slack in V lead, but o/w no complications. Healthy surgical site.  PLAN: Recheck leads at one week follow up. Start Xarelto in 72 hours. DC aspirin. Start metoprolol 25 mg daily and reduce losartan to 1/2 (50 mg daily). Strict adherence to left arm extension/abduction.  Thurmon Fair, MD, [facility] [phone] 01/22/2012, 8:54 AM

## 2012-01-22 NOTE — Discharge Summary (Signed)
Physician Discharge Summary  Patient ID: [name] MRN: [id number] DOB/AGE: Sep 08, 1940 71 y.o.  Admit date: 01/21/2012 Discharge date: 01/22/2012  Discharge Diagnoses:  Principal Problem:  *Symptomatic sinus bradycardia Active Problems:  PAF (paroxysmal atrial fibrillation)  Sinus node dysfunction  S/P placement of cardiac pacemaker, medtronic adapta 01/21/12  MITRAL REGURGITATION  CORONARY ARTERY DISEASE, nonobstructive at cath 2001 20% LAD, negative Nuc stress 2012  MITRAL VALVE PROLAPSE  PVD (peripheral vascular disease), hx stents to bil SFAs 02/2010  PROCEDURES: Placement of PPM 01/21/12 by Dr. Royann Shivers Device details:  Generator Medtronic Adapta model ADDRL1 serial number [id number]  Right atrial lead Medtronic [phone] serial number [id number]  Right ventricular lead Medtronic 5076 serial number [id number]   Discharged Condition: good  Hospital Course:  59 yowf arrived for dual chamber pacemaker implantation due to symptomatic sinus bradycardia, tachycardia-bradycardia syndrome and necessary medications for paroxysmal atrial fibrillation.  Also plans were discussed for anticoagulation after procedure for her PAF.  She rec'd a medtronic adapta pacemaker without complications.    The next AM, she had no complaints, pacer site without hematoma.  Her CXR without pneumothorax but her Ventricular lead did not have much slack in the lead.  Her impedence was stable.  Mild discomfort at site, sling in place.   Pt was seen and examined by Dr. Royann Shivers.  We will begin Xarelto on Monday the 16th of Dec.  And at that time she may d/c her ASA.  Toprol was also added to meds and we decreased the Losartan to 50 mg.  She ambulated without complications, was stable and discharged home.  On her follow up in one week for pacer site check, she will need pacer interrogated to evaluate the ventricular lead.  At discharge she was atrially pacing and ventricular sensing.     Consults:  None  Significant Diagnostic Studies: outpatient only labs  PA and Lat CXR: CHEST - 2 VIEW  Comparison: Chest x-ray of 11/17/2011  Findings: Pain a dual lead permanent pacemaker is now present. No  pneumothorax is seen. The lungs are well aerated. No effusion is  noted. Cardiomegaly is stable. No bony abnormality is seen.  IMPRESSION:  Dual lead permanent pacemaker now present. No pneumothorax.  Stable cardiomegaly  Discharge Exam: Blood pressure 149/71, pulse 62, temperature 97.6 F (36.4 C), temperature source Oral, resp. rate 17, height 5' 4.5" (1.638 m), weight 66.2 kg (145 lb 15.1 oz), SpO2 96.00%.  AM exam: PE: General:alert and oriented, pleasant affect  Heart:S1S2 RRR  Lungs:clear no rales no wheezes  Abd:+ BS soft non tender  Ext:no edema  Disposition: 01-Home or Self Care     Medication List     As of 01/22/2012  3:50 PM    TAKE these medications         5-HTP 50 MG Caps   Take 1 tablet by mouth daily.      amLODipine 5 MG tablet   Commonly known as: NORVASC   Take 5 mg by mouth daily.      aspirin 81 MG EC tablet   Take 162 mg by mouth daily.      citalopram 20 MG tablet   Commonly known as: CELEXA   Take 20 mg by mouth daily.      HYDROcodone-acetaminophen 5-325 MG per tablet   Commonly known as: NORCO/VICODIN   Take 1 tablet by mouth every 4 (four) hours as needed.      losartan 50 MG tablet   Commonly known as: COZAAR  Take 1 tablet (50 mg total) by mouth daily.      metoprolol succinate 25 MG 24 hr tablet   Commonly known as: TOPROL-XL   Take 1 tablet (25 mg total) by mouth daily.      multivitamin with minerals Tabs   Take 1 tablet by mouth daily.      pyridOXINE 100 MG tablet   Commonly known as: VITAMIN B-6   Take 100 mg by mouth daily.      Rivaroxaban 20 MG Tabs   Commonly known as: XARELTO   Take 1 tablet (20 mg total) by mouth daily with breakfast.   Start taking on: 01/25/2012           Follow-up Information    Follow  up with Abelino Derrick, PA. On 01/29/2012. (at 2:00pm for pacer site check and interrogation.)    Contact information:   [address] [place] [address] [phone]        Discharge Instructions: Begin xarelto on Monday the 16th, not before.  Stop Asprin on the 16th.  Heart healthy diet  Supplemental Discharge Instructions for  Pacemaker Patients   Signed: INGOLD,LAURA R 01/22/2012, 3:50 PM

## 2012-06-03 ENCOUNTER — Emergency Department (HOSPITAL_COMMUNITY)
Admission: EM | Admit: 2012-06-03 | Discharge: 2012-06-03 | Disposition: A | Discharge status: Home / Self Care | Payer: Medicare Other | Attending: Emergency Medicine | Admitting: Emergency Medicine

## 2012-06-03 ENCOUNTER — Encounter (HOSPITAL_COMMUNITY): Payer: Self-pay | Admitting: Emergency Medicine

## 2012-06-03 ENCOUNTER — Emergency Department (HOSPITAL_COMMUNITY): Payer: Medicare Other

## 2012-06-03 DIAGNOSIS — M5030 Other cervical disc degeneration, unspecified cervical region: Secondary | ICD-10-CM | POA: Insufficient documentation

## 2012-06-03 DIAGNOSIS — F411 Generalized anxiety disorder: Secondary | ICD-10-CM | POA: Insufficient documentation

## 2012-06-03 DIAGNOSIS — M19049 Primary osteoarthritis, unspecified hand: Secondary | ICD-10-CM | POA: Insufficient documentation

## 2012-06-03 DIAGNOSIS — I10 Essential (primary) hypertension: Secondary | ICD-10-CM | POA: Insufficient documentation

## 2012-06-03 DIAGNOSIS — Z8739 Personal history of other diseases of the musculoskeletal system and connective tissue: Secondary | ICD-10-CM | POA: Insufficient documentation

## 2012-06-03 DIAGNOSIS — F3289 Other specified depressive episodes: Secondary | ICD-10-CM | POA: Insufficient documentation

## 2012-06-03 DIAGNOSIS — M5137 Other intervertebral disc degeneration, lumbosacral region: Secondary | ICD-10-CM | POA: Insufficient documentation

## 2012-06-03 DIAGNOSIS — K5900 Constipation, unspecified: Secondary | ICD-10-CM

## 2012-06-03 DIAGNOSIS — E785 Hyperlipidemia, unspecified: Secondary | ICD-10-CM | POA: Insufficient documentation

## 2012-06-03 DIAGNOSIS — R109 Unspecified abdominal pain: Secondary | ICD-10-CM

## 2012-06-03 DIAGNOSIS — I2510 Atherosclerotic heart disease of native coronary artery without angina pectoris: Secondary | ICD-10-CM | POA: Insufficient documentation

## 2012-06-03 DIAGNOSIS — Z950 Presence of cardiac pacemaker: Secondary | ICD-10-CM | POA: Insufficient documentation

## 2012-06-03 DIAGNOSIS — M51379 Other intervertebral disc degeneration, lumbosacral region without mention of lumbar back pain or lower extremity pain: Secondary | ICD-10-CM | POA: Insufficient documentation

## 2012-06-03 DIAGNOSIS — Z8742 Personal history of other diseases of the female genital tract: Secondary | ICD-10-CM | POA: Insufficient documentation

## 2012-06-03 DIAGNOSIS — Z8719 Personal history of other diseases of the digestive system: Secondary | ICD-10-CM | POA: Insufficient documentation

## 2012-06-03 DIAGNOSIS — Z79899 Other long term (current) drug therapy: Secondary | ICD-10-CM | POA: Insufficient documentation

## 2012-06-03 DIAGNOSIS — Z87891 Personal history of nicotine dependence: Secondary | ICD-10-CM | POA: Insufficient documentation

## 2012-06-03 DIAGNOSIS — R011 Cardiac murmur, unspecified: Secondary | ICD-10-CM | POA: Insufficient documentation

## 2012-06-03 DIAGNOSIS — Z7982 Long term (current) use of aspirin: Secondary | ICD-10-CM | POA: Insufficient documentation

## 2012-06-03 DIAGNOSIS — I739 Peripheral vascular disease, unspecified: Secondary | ICD-10-CM | POA: Insufficient documentation

## 2012-06-03 DIAGNOSIS — Z8679 Personal history of other diseases of the circulatory system: Secondary | ICD-10-CM | POA: Insufficient documentation

## 2012-06-03 DIAGNOSIS — I4891 Unspecified atrial fibrillation: Secondary | ICD-10-CM | POA: Insufficient documentation

## 2012-06-03 DIAGNOSIS — F329 Major depressive disorder, single episode, unspecified: Secondary | ICD-10-CM | POA: Insufficient documentation

## 2012-06-03 DIAGNOSIS — Z87448 Personal history of other diseases of urinary system: Secondary | ICD-10-CM | POA: Insufficient documentation

## 2012-06-03 DIAGNOSIS — Z9889 Other specified postprocedural states: Secondary | ICD-10-CM | POA: Insufficient documentation

## 2012-06-03 LAB — PROTIME-INR
INR: 1.55 — ABNORMAL HIGH (ref 0.00–1.49)
Prothrombin Time: 18.1 seconds — ABNORMAL HIGH (ref 11.6–15.2)

## 2012-06-03 LAB — COMPREHENSIVE METABOLIC PANEL
ALT: 11 U/L (ref 0–35)
AST: 16 U/L (ref 0–37)
Albumin: 3.4 g/dL — ABNORMAL LOW (ref 3.5–5.2)
Alkaline Phosphatase: 98 U/L (ref 39–117)
BUN: 15 mg/dL (ref 6–23)
CO2: 28 mEq/L (ref 19–32)
Calcium: 10.6 mg/dL — ABNORMAL HIGH (ref 8.4–10.5)
Chloride: 101 mEq/L (ref 96–112)
Creatinine, Ser: 0.83 mg/dL (ref 0.50–1.10)
GFR calc Af Amer: 80 mL/min — ABNORMAL LOW (ref >90)
GFR calc non Af Amer: 69 mL/min — ABNORMAL LOW (ref >90)
Glucose, Bld: 117 mg/dL — ABNORMAL HIGH (ref 70–99)
Potassium: 4.3 mEq/L (ref 3.5–5.1)
Sodium: 136 mEq/L (ref 135–145)
Total Bilirubin: 0.4 mg/dL (ref 0.3–1.2)
Total Protein: 6.5 g/dL (ref 6.0–8.3)

## 2012-06-03 LAB — CBC WITH DIFFERENTIAL/PLATELET
Basophils Absolute: 0 10*3/uL (ref 0.0–0.1)
Basophils Relative: 0 % (ref 0–1)
Eosinophils Absolute: 0.2 10*3/uL (ref 0.0–0.7)
Eosinophils Relative: 2 % (ref 0–5)
HCT: 37.7 % (ref 36.0–46.0)
Hemoglobin: 12.9 g/dL (ref 12.0–15.0)
Lymphocytes Relative: 22 % (ref 12–46)
Lymphs Abs: 2.2 10*3/uL (ref 0.7–4.0)
MCH: 30.3 pg (ref 26.0–34.0)
MCHC: 34.2 g/dL (ref 30.0–36.0)
MCV: 88.5 fL (ref 78.0–100.0)
Monocytes Absolute: 0.9 10*3/uL (ref 0.1–1.0)
Monocytes Relative: 9 % (ref 3–12)
Neutro Abs: 6.5 10*3/uL (ref 1.7–7.7)
Neutrophils Relative %: 66 % (ref 43–77)
Platelets: 271 10*3/uL (ref 150–400)
RBC: 4.26 MIL/uL (ref 3.87–5.11)
RDW: 12.7 % (ref 11.5–15.5)
WBC: 9.8 10*3/uL (ref 4.0–10.5)

## 2012-06-03 LAB — URINALYSIS, ROUTINE W REFLEX MICROSCOPIC
Glucose, UA: NEGATIVE mg/dL
Hgb urine dipstick: NEGATIVE
Ketones, ur: NEGATIVE mg/dL
Leukocytes, UA: NEGATIVE
Nitrite: NEGATIVE
Protein, ur: NEGATIVE mg/dL
Specific Gravity, Urine: 1.032 — ABNORMAL HIGH (ref 1.005–1.030)
Urobilinogen, UA: 1 mg/dL (ref 0.0–1.0)
pH: 5.5 (ref 5.0–8.0)

## 2012-06-03 LAB — LIPASE, BLOOD: Lipase: 25 U/L (ref 11–59)

## 2012-06-03 MED ORDER — SODIUM CHLORIDE 0.9 % IV SOLN
1000.0000 mL | INTRAVENOUS | Status: DC
  Administered 2012-06-03: 1000 mL via INTRAVENOUS

## 2012-06-03 MED ORDER — HYDROMORPHONE HCL PF 1 MG/ML IJ SOLN: 0.5000 mg | INTRAMUSCULAR | Status: DC | PRN

## 2012-06-03 MED ORDER — SODIUM CHLORIDE 0.9 % IV SOLN
1000.0000 mL | Freq: Once | INTRAVENOUS | Status: AC
  Administered 2012-06-03: 1000 mL via INTRAVENOUS

## 2012-06-03 MED ORDER — ONDANSETRON HCL 4 MG/2ML IJ SOLN
4.0000 mg | Freq: Once | INTRAMUSCULAR | Status: AC
  Administered 2012-06-03: 4 mg via INTRAVENOUS
  Filled 2012-06-03: qty 2

## 2012-06-03 MED ORDER — HYDROMORPHONE HCL PF 1 MG/ML IJ SOLN
1.0000 mg | Freq: Once | INTRAMUSCULAR | Status: AC
  Administered 2012-06-03: 1 mg via INTRAVENOUS
  Filled 2012-06-03: qty 1

## 2012-06-03 MED ORDER — SODIUM CHLORIDE 0.9 % IV SOLN
Freq: Once | INTRAVENOUS | Status: AC
  Administered 2012-06-03: 15:00:00 via INTRAVENOUS

## 2012-06-03 MED ORDER — MAGNESIUM CITRATE PO SOLN: 150.0000 mL | Freq: Two times a day (BID) | ORAL | Status: DC | PRN

## 2012-06-03 NOTE — ED Notes (Signed)
Pt unable to void at this time. 

## 2012-06-03 NOTE — ED Notes (Signed)
MD at bedside. 

## 2012-06-03 NOTE — ED Provider Notes (Signed)
History    CSN: [id number] Arrival date & time 06/03/12  1331 First MD Initiated Contact with Patient 06/03/12 1512      Chief Complaint  Patient presents with  . Abdominal Pain    HPI Comments: SHe has felt constipated and tried taking some dulcolax.  It has not helped much.  She has had cramping and back pain.  Patient is a 72 y.o. female presenting with abdominal pain. The history is provided by the patient.  Abdominal Pain Duration:  3 weeks Timing:  Intermittent Chronicity:  Recurrent Relieved by:  Nothing Associated symptoms: constipation   Associated symptoms: no fever and no vomiting    patient's appetite has been fine. The pain that she has is intermittent and currently she is not having any pain. Patient came to the emergency room today because of persistent symptoms. She has not tried to see anyone prior to this  Past Medical History  Diagnosis Date  . GLUCOSE INTOLERANCE 12/31/2009  . HYPERLIPIDEMIA 12/31/2009  . ANXIETY 12/31/2009  . DEPRESSION 12/31/2009  . MITRAL REGURGITATION 12/31/2009  . HYPERTENSION 12/31/2009  . CORONARY ARTERY DISEASE 12/31/2009  . MITRAL VALVE PROLAPSE 12/31/2009  . MENOPAUSE, EARLY 12/31/2009  . Unspecified urinary incontinence 12/31/2009  . ABDOMINAL PAIN, LOWER 12/31/2009  . DIVERTICULITIS, HX OF 12/31/2009  . Pacemaker 01/21/2012    dual chamber  . Tachycardia-bradycardia syndrome     Hattie Perch 01/21/2012  . PAD (peripheral artery disease)   . Heart murmur     "used to say I did" (01/21/2012)  . OSTEOARTHRITIS, HAND 12/31/2009  . DISC DISEASE, CERVICAL 12/31/2009  . DISC DISEASE, LUMBAR 12/31/2009  . Symptomatic sinus bradycardia 01/22/2012  . PAF (paroxysmal atrial fibrillation) 01/22/2012  . Sinus node dysfunction 01/22/2012  . S/P placement of cardiac pacemaker, medtronic adapta 01/21/12 01/22/2012  . PVD (peripheral vascular disease), hx stents to bil SFAs 02/2010 01/22/2012    Past Surgical History  Procedure Laterality  Date  . Cardiac catheterization  2005    "negative" (01/21/2012)  . Oophorectomy  ~1979  . Partial colectomy  2010  . Carpal tunnel release  2008    "right hand/thumb; carpal tunnel repair; got rid of arthritis" (01/21/2012)  . Insert / replace / remove pacemaker  01/21/2012    initial placement  . Vaginal hysterectomy  1975  . Posterior cervical laminectomy  1985  . Peripheral arterial stent graft  2012; 2012    "LLE; RLE" (01/21/2012)  . Facelift, lower 2/3  1995    "mini" (01/21/2012)  . Augmentation mammaplasty  1983    Family History  Problem Relation Age of Onset  . Heart disease Brother     3 brothers with heart disease (2 also with CVA)    History  Substance Use Topics  . Smoking status: Former Smoker -- 0.75 packs/day for 10 years    Types: Cigarettes  . Smokeless tobacco: Never Used     Comment: 01/21/2012 "quit smoking ~ 2002"  . Alcohol Use: 9.0 oz/week    15 Shots of liquor per week     Comment: 01/21/2012 "3 times/wk I have a couple mixed drinks"    OB History   Grav Para Term Preterm Abortions TAB SAB Ect Mult Living                  Review of Systems  Constitutional: Negative for fever.  Gastrointestinal: Positive for abdominal pain and constipation. Negative for vomiting.  All other systems reviewed and are negative.  Allergies  Review of patient's allergies indicates no known allergies.  Home Medications   Current Outpatient Rx  Name  Route  Sig  Dispense  Refill  . amLODipine (NORVASC) 5 MG tablet   Oral   Take 5 mg by mouth daily.           Marland Kitchen aspirin 81 MG EC tablet   Oral   Take 81 mg by mouth daily.          . citalopram (CELEXA) 20 MG tablet   Oral   Take 20 mg by mouth daily.           Marland Kitchen losartan (COZAAR) 50 MG tablet   Oral   Take 1 tablet (50 mg total) by mouth daily.   30 tablet   11   . metoprolol succinate (TOPROL-XL) 25 MG 24 hr tablet   Oral   Take 1 tablet (25 mg total) by mouth daily.   30 tablet    11   . pyridOXINE (VITAMIN B-6) 100 MG tablet   Oral   Take 300 mg by mouth daily.          . Rivaroxaban (XARELTO) 20 MG TABS   Oral   Take 1 tablet (20 mg total) by mouth daily with breakfast.   30 tablet   6   . magnesium citrate solution   Oral   Take 150 mLs by mouth 2 (two) times daily as needed (constipation).   300 mL   1     BP 104/54  Pulse 60  Temp(Src) 98.3 F (36.8 C) (Oral)  Resp 20  SpO2 98%  Physical Exam  Nursing note and vitals reviewed. Constitutional: She appears well-developed and well-nourished. No distress.  HENT:  Head: Normocephalic and atraumatic.  Right Ear: External ear normal.  Left Ear: External ear normal.  Eyes: Conjunctivae are normal. Right eye exhibits no discharge. Left eye exhibits no discharge. No scleral icterus.  Neck: Neck supple. No tracheal deviation present.  Cardiovascular: Normal rate, regular rhythm and intact distal pulses.   Pulmonary/Chest: Effort normal and breath sounds normal. No stridor. No respiratory distress. She has no wheezes. She has no rales.  Abdominal: Soft. Bowel sounds are normal. She exhibits no distension. There is no tenderness. There is no rebound and no guarding.  Genitourinary: Rectal exam shows no mass, no tenderness and anal tone normal.  Musculoskeletal: She exhibits no edema and no tenderness.  Neurological: She is alert. She has normal strength. No sensory deficit. Cranial nerve deficit:  no gross defecits noted. She exhibits normal muscle tone. She displays no seizure activity. Coordination normal.  Skin: Skin is warm and dry. No rash noted.  Psychiatric: She has a normal mood and affect.    ED Course  Procedures (including critical care time)  Labs Reviewed  COMPREHENSIVE METABOLIC PANEL - Abnormal; Notable for the following:    Glucose, Bld 117 (*)    Calcium 10.6 (*)    Albumin 3.4 (*)    GFR calc non Af Amer 69 (*)    GFR calc Af Amer 80 (*)    All other components within normal  limits  URINALYSIS, ROUTINE W REFLEX MICROSCOPIC - Abnormal; Notable for the following:    Color, Urine AMBER (*)    APPearance CLOUDY (*)    Specific Gravity, Urine 1.032 (*)    Bilirubin Urine SMALL (*)    All other components within normal limits  PROTIME-INR - Abnormal; Notable for the following:  Prothrombin Time 18.1 (*)    INR 1.55 (*)    All other components within normal limits  CBC WITH DIFFERENTIAL  LIPASE, BLOOD   Dg Abd Acute W/chest  06/03/2012  *RADIOLOGY REPORT*  Clinical Data: Nausea, vomiting, chills.Constipation.  ACUTE ABDOMEN SERIES (ABDOMEN 2 VIEW & CHEST 1 VIEW)  Comparison: 01/22/2012.  Chest radiograph.  Findings: The cardiopericardial silhouette appears within normal limits.  Mediastinal contours are normal.  Dual lead left subclavian cardiac pacemaker appears unchanged compared to prior exam.  No free air underneath the hemidiaphragms.  The bowel gas pattern is nonobstructive.  No dilated loops of large or small bowel.  Moderate to large stool burden is present.  Levoconvex curvature of the lumbar spine incidentally noted.  IMPRESSION: Nonobstructive bowel gas pattern without acute abnormality. Moderate to large stool burden.   Original Report Authenticated By: Andreas Newport, M.D.      1. Abdominal pain   2. Constipation       MDM  Patient's evaluation is unremarkable. Her abdomen is nontender. X-rays do not show evidence of obstruction. Her laboratory tests are normal. She does have constipation as evidenced by her symptoms as well as her x-rays. It is reasonable to try outpatient treatment and have her follow up with her primary Dr. Davina Poke the patient return to emergency room for fever vomiting worsening symptoms. I told her to follow up with her doctor next week if the symptoms have not completely resolved.        Celene Kras, MD 06/03/12 [phone]

## 2012-06-03 NOTE — ED Notes (Signed)
Patient transported to X-ray 

## 2012-06-03 NOTE — ED Notes (Signed)
Pt presenting to ed with c/o abdominal pain with positive nausea no vomiting. Pt states pain radiates into her back. Pt states she's also constipated. Pt states decreased appetite

## 2012-07-08 ENCOUNTER — Other Ambulatory Visit: Payer: Self-pay | Admitting: *Deleted

## 2012-07-08 MED ORDER — AMLODIPINE BESYLATE 5 MG PO TABS: 5.0000 mg | ORAL_TABLET | Freq: Every day | ORAL | Status: DC

## 2012-07-14 ENCOUNTER — Telehealth: Payer: Self-pay | Admitting: Cardiovascular Disease

## 2012-07-14 MED ORDER — RIVAROXABAN 20 MG PO TABS: 20.0000 mg | ORAL_TABLET | Freq: Every day | ORAL | Status: DC

## 2012-07-14 NOTE — Telephone Encounter (Signed)
Pt would like to have some samples of Xarelto please!

## 2012-07-14 NOTE — Telephone Encounter (Signed)
Samples left at front desk, [facility] cell phone

## 2012-08-11 ENCOUNTER — Telehealth: Payer: Self-pay | Admitting: Cardiovascular Disease

## 2012-08-11 MED ORDER — RIVAROXABAN 20 MG PO TABS: 20.0000 mg | ORAL_TABLET | Freq: Every day | ORAL | Status: DC

## 2012-08-11 NOTE — Telephone Encounter (Signed)
Would like some samples of Xalrelto 20mg  please!

## 2012-08-11 NOTE — Telephone Encounter (Signed)
Call and informed patient that she can pick up xarelto 20mg  samples at front desk.

## 2012-08-16 ENCOUNTER — Telehealth (HOSPITAL_COMMUNITY): Payer: Self-pay | Admitting: Cardiovascular Disease

## 2012-08-16 ENCOUNTER — Other Ambulatory Visit (HOSPITAL_COMMUNITY): Payer: Self-pay | Admitting: Cardiovascular Disease

## 2012-08-16 DIAGNOSIS — I739 Peripheral vascular disease, unspecified: Secondary | ICD-10-CM

## 2012-08-16 NOTE — Telephone Encounter (Signed)
LEFT MESSAGE TO CALL BACK REGARDING SCHEDULING APPT FOR TESTING

## 2012-09-06 ENCOUNTER — Telehealth: Payer: Self-pay | Admitting: Cardiovascular Disease

## 2012-09-06 ENCOUNTER — Ambulatory Visit (HOSPITAL_COMMUNITY)
Admission: RE | Admit: 2012-09-06 | Discharge: 2012-09-06 | Disposition: A | Discharge status: Home / Self Care | Payer: Medicare Other | Source: Ambulatory Visit | Attending: Cardiovascular Disease | Admitting: Cardiovascular Disease

## 2012-09-06 DIAGNOSIS — I739 Peripheral vascular disease, unspecified: Secondary | ICD-10-CM

## 2012-09-06 NOTE — Telephone Encounter (Signed)
Would like some samples of Xarelto please. Coming in today for a test-would like to pick them up at that time.Woulk like 3o pills if possible please.

## 2012-09-06 NOTE — Progress Notes (Signed)
Renal Duplex Completed. Akeelah Seppala, RDMS, RVT  

## 2012-09-07 ENCOUNTER — Telehealth: Payer: Self-pay | Admitting: *Deleted

## 2012-09-07 DIAGNOSIS — I701 Atherosclerosis of renal artery: Secondary | ICD-10-CM

## 2012-09-07 MED ORDER — RIVAROXABAN 20 MG PO TABS: 20.0000 mg | ORAL_TABLET | Freq: Every day | ORAL | Status: DC

## 2012-09-07 NOTE — Addendum Note (Signed)
Addended by: Beecher Mcardle R on: 09/07/2012 09:57 AM   Modules accepted: Orders

## 2012-09-07 NOTE — Telephone Encounter (Signed)
Samples left at front desk for pt pick up.  Lot: [id number] Exp: 12/2014.  Returned call and pt informed samples left at front desk.  Pt verbalized understanding and agreed w/ plan.    

## 2012-09-07 NOTE — Telephone Encounter (Signed)
Message copied by Marella Bile on Wed Sep 07, 2012  5:24 PM ------      Message from: Runell Gess      Created: Wed Sep 07, 2012  5:05 PM       No change from prior study. Repeat in 12 months. ------

## 2012-09-29 ENCOUNTER — Other Ambulatory Visit (HOSPITAL_COMMUNITY): Payer: Self-pay | Admitting: *Deleted

## 2012-09-29 ENCOUNTER — Telehealth (HOSPITAL_COMMUNITY): Payer: Self-pay | Admitting: Cardiovascular Disease

## 2012-09-29 NOTE — Telephone Encounter (Signed)
I called the patient to schedule her Q6 Lower Ext Art Doppler, but she states that its supposed to be Q12.  Please clarify if we should be scheduling now or in another 6 months

## 2012-09-30 NOTE — Telephone Encounter (Signed)
Her renal dopplers are every 12 months, but her lower dopplers should be every 6 months.

## 2012-10-05 ENCOUNTER — Telehealth (HOSPITAL_COMMUNITY): Payer: Self-pay | Admitting: Surgery

## 2012-10-17 ENCOUNTER — Telehealth (HOSPITAL_COMMUNITY): Payer: Self-pay | Admitting: Cardiovascular Disease

## 2012-10-18 ENCOUNTER — Encounter (HOSPITAL_COMMUNITY): Payer: Self-pay | Admitting: *Deleted

## 2012-10-20 ENCOUNTER — Encounter: Payer: Self-pay | Admitting: *Deleted

## 2012-10-26 ENCOUNTER — Telehealth: Payer: Self-pay | Admitting: Cardiovascular Disease

## 2012-10-26 MED ORDER — RIVAROXABAN 20 MG PO TABS: 20.0000 mg | ORAL_TABLET | Freq: Every day | ORAL | Status: DC

## 2012-10-26 NOTE — Telephone Encounter (Signed)
Returned call and pt informed samples left at front desk.  Pt verbalized understanding and agreed w/ plan.    

## 2012-10-26 NOTE — Telephone Encounter (Signed)
Need samples of Xarelto 20 mg please. °

## 2012-10-28 ENCOUNTER — Ambulatory Visit (INDEPENDENT_AMBULATORY_CARE_PROVIDER_SITE_OTHER): Payer: Medicare Other | Admitting: Cardiovascular Disease

## 2012-10-28 ENCOUNTER — Encounter: Payer: Self-pay | Admitting: Cardiovascular Disease

## 2012-10-28 VITALS — BP 136/84 | HR 66 | Resp 12 | Ht 64.5 in | Wt 152.8 lb

## 2012-10-28 DIAGNOSIS — E785 Hyperlipidemia, unspecified: Secondary | ICD-10-CM

## 2012-10-28 DIAGNOSIS — R001 Bradycardia, unspecified: Secondary | ICD-10-CM

## 2012-10-28 DIAGNOSIS — I4891 Unspecified atrial fibrillation: Secondary | ICD-10-CM

## 2012-10-28 DIAGNOSIS — I2510 Atherosclerotic heart disease of native coronary artery without angina pectoris: Secondary | ICD-10-CM

## 2012-10-28 DIAGNOSIS — I480 Paroxysmal atrial fibrillation: Secondary | ICD-10-CM

## 2012-10-28 DIAGNOSIS — I495 Sick sinus syndrome: Secondary | ICD-10-CM

## 2012-10-28 DIAGNOSIS — I498 Other specified cardiac arrhythmias: Secondary | ICD-10-CM

## 2012-10-28 DIAGNOSIS — I739 Peripheral vascular disease, unspecified: Secondary | ICD-10-CM

## 2012-10-28 DIAGNOSIS — I4892 Unspecified atrial flutter: Secondary | ICD-10-CM

## 2012-10-28 DIAGNOSIS — Z950 Presence of cardiac pacemaker: Secondary | ICD-10-CM

## 2012-10-28 LAB — PACEMAKER DEVICE OBSERVATION

## 2012-10-28 MED ORDER — METOPROLOL SUCCINATE ER 50 MG PO TB24: 50.0000 mg | ORAL_TABLET | Freq: Every day | ORAL | Status: DC

## 2012-10-28 NOTE — Progress Notes (Signed)
In office pacemaker interrogation. Normal device function, but changes were made this session.

## 2012-10-28 NOTE — Patient Instructions (Addendum)
Your physician has recommended you make the following change in your medication: Increase Metoprolol to 50 mg once daily   Your physician recommends that you schedule a follow-up appointment in: 3 months

## 2012-10-29 ENCOUNTER — Encounter: Payer: Self-pay | Admitting: Cardiovascular Disease

## 2012-10-29 NOTE — Assessment & Plan Note (Signed)
Reports intolerance to statins. She is unwilling to try a different agent

## 2012-10-29 NOTE — Assessment & Plan Note (Signed)
Normal device function. Battery is still at beginning of life. There is 87% atrial pacing and almost no ventricular pacing. Heart rate histogram distribution is fair. The lead parameters are normal. Please see separate pacemaker noted.

## 2012-10-29 NOTE — Assessment & Plan Note (Signed)
If chest heaviness persists after control of rapid ventricular rates, consider repeat myocardial perfusion imaging.

## 2012-10-29 NOTE — Assessment & Plan Note (Addendum)
Recently her episodes of atrial fibrillation have become symptomatic. This appears to be secondary to the fact that she now has rapid ventricular response. In the past she had lengthy episodes of atrial fibrillation with controlled rates that she was unaware of. I'm not sure why the ventricular rate has increased. She reports compliance with her medications including metoprolol succinate. No other signs or symptoms of hyperthyroidism. The overall burden of atrial fibrillation is however quite low (less than 1%) and antiarrhythmic therapy does not appear to be immediately justified. We'll increase the Toprol-XL to 50 mg once daily. Followup in 3 months with an in office device checked at that time, to assess adequacy of ventricular rate control and adjust medications further if needed. If her blood pressure drops too much with the increase in beta blocker there is room to reduce the dose of her other antihypertensive medications. Continue Rivaroxaban.

## 2012-10-29 NOTE — Assessment & Plan Note (Signed)
It is probably time to reevaluate the restenotic lesion in her left thigh.

## 2012-10-29 NOTE — Progress Notes (Signed)
Patient ID: [name], female   DOB: 29-Jan-1941, 72 y.o.   MRN: [id number]    Reason for office visit Atrial fibrillation, pacemaker  [name] is now roughly 9 months following implantation of a dual-chamber permanent pacemaker for sinus node dysfunction. She also has paroxysmal atrial fibrillation with a relatively low burden. Since her last device check she has had only 0.8% of the time, 217 episodes of atrial fibrillation. Many of the episodes are very brief the longest one lasted for 16 hours. Previously she was unaware of the atrial fibrillation but now she has complaints of racing palpitations, shortness of breath and even some occasional "heaviness" in her chest.  Her pacemaker shows that the ventricular rate during episodes of atrial fibrillation has increased to the 130s. Previously ventricular rate control was better. There has also been some confusion in the dose of her beta blocker. I had recommended at her last visit that she increase the dose to 50 mg once daily but she is still taking the 25 mg daily dose.  Note normal left ventricular function and no evidence of coronary insufficiency by relatively recent nuclear stress test in 2012. She did have minor coronary atherosclerosis without any obstructive lesions by a remote cardiac catheterization in 2001. She has well established peripheral arterial disease and has undergone bilateral superficial femoral artery stents in 2012. ALT duplex ultrasonography in September of last year did show a 50-69% restenosis in the left superficial femoral artery stent, but she remains free of intermittent claudication. She has nonobstructive bilateral renal artery lesions.    Allergies  Allergen Reactions  . Clonidine Derivatives     Lowers heart rate  . Plavix [Clopidogrel Bisulfate]     Joint pains  . Exforge [Amlodipine Besylate-Valsartan] Itching and Rash    Current Outpatient Prescriptions  Medication Sig Dispense Refill  .  amLODipine (NORVASC) 5 MG tablet Take 1 tablet (5 mg total) by mouth daily.  30 tablet  11  . aspirin 81 MG EC tablet Take 81 mg by mouth daily.       . citalopram (CELEXA) 20 MG tablet Take 20 mg by mouth daily.        Marland Kitchen losartan (COZAAR) 50 MG tablet Take 1 tablet (50 mg total) by mouth daily.  30 tablet  11  . Multiple Vitamin (MULTIVITAMIN) tablet Take 1 tablet by mouth daily.      Marland Kitchen pyridOXINE (VITAMIN B-6) 100 MG tablet Take 300 mg by mouth daily.       . Rivaroxaban (XARELTO) 20 MG TABS tablet Take 1 tablet (20 mg total) by mouth daily with breakfast.  30 tablet  0  . vitamin B-12 (CYANOCOBALAMIN) 1000 MCG tablet Take 1,000 mcg by mouth daily.      . metoprolol succinate (TOPROL-XL) 50 MG 24 hr tablet Take 1 tablet (50 mg total) by mouth daily. Take with or immediately following a meal.  30 tablet  3   No current facility-administered medications for this visit.    Past Medical History  Diagnosis Date  . GLUCOSE INTOLERANCE 12/31/2009  . HYPERLIPIDEMIA 12/31/2009  . ANXIETY 12/31/2009  . DEPRESSION 12/31/2009  . MITRAL REGURGITATION 12/31/2009  . HYPERTENSION 12/31/2009  . CORONARY ARTERY DISEASE 12/31/2009  . MITRAL VALVE PROLAPSE 12/31/2009  . MENOPAUSE, EARLY 12/31/2009  . Unspecified urinary incontinence 12/31/2009  . ABDOMINAL PAIN, LOWER 12/31/2009  . DIVERTICULITIS, HX OF 12/31/2009  . Pacemaker 01/21/2012    dual chamber  . Tachycardia-bradycardia syndrome     /  notes 01/21/2012  . PAD (peripheral artery disease)   . Heart murmur     "used to say I did" (01/21/2012)  . OSTEOARTHRITIS, HAND 12/31/2009  . DISC DISEASE, CERVICAL 12/31/2009  . DISC DISEASE, LUMBAR 12/31/2009  . Symptomatic sinus bradycardia 01/22/2012  . PAF (paroxysmal atrial fibrillation) 01/22/2012  . Sinus node dysfunction 01/22/2012  . S/P placement of cardiac pacemaker, medtronic adapta 01/21/12 01/22/2012  . PVD (peripheral vascular disease), hx stents to bil SFAs 02/2010 01/22/2012  . PAD  (peripheral artery disease)     Past Surgical History  Procedure Laterality Date  . Cardiac catheterization  2005    "negative" (01/21/2012)  . Oophorectomy  ~1979  . Partial colectomy  2010  . Carpal tunnel release  2008    "right hand/thumb; carpal tunnel repair; got rid of arthritis" (01/21/2012)  . Insert / replace / remove pacemaker  01/21/2012    initial placement  . Vaginal hysterectomy  1975  . Posterior cervical laminectomy  1985  . Peripheral arterial stent graft  2012; 2012    "LLE; RLE" (01/21/2012)  . Facelift, lower 2/3  1995    "mini" (01/21/2012)  . Augmentation mammaplasty  1983  . Pacemaker insertion  01/21/2012    Medtronic Adapta, model# ADDRL1, serial# [id number]  . Lower arterial examination  10/28/2011    R. SFA stent mild-moderate mixed density plaque with elevated velocities consistent with 50% diameter reduction. L. SFA stent moderate mixed denisty plaque at mid to distal level consistent with 50-69% diameter reduction.  . Cardiac catheterization  09/17/1999    Mild CAD involving proximal portion of LAD. Mitral valve prolapse w/ mitral regurg. No evidence of renal artery stenosis. Recommended readjustment of antihypertensive medications.  . Persantine myoview stress test  02/25/2010    No scintigraphic evidence of inducible myocardial ischemia. No persantine EKG changes. Non-diagnositc for ishcemia. Low-risk, normal scan.    Family History  Problem Relation Age of Onset  . Heart disease Brother     3 brothers with heart disease (2 also with CVA)    History   Social History  . Marital Status: Married    Spouse Name: N/A    Number of Children: 3  . Years of Education: N/A   Occupational History  . retired Psychologist, counselling    Social History Main Topics  . Smoking status: Former Smoker -- 0.75 packs/day for 10 years    Types: Cigarettes  . Smokeless tobacco: Never Used     Comment: 01/21/2012 "quit smoking ~ 2002"  . Alcohol Use: 9.0 oz/week    15  Shots of liquor per week     Comment: 01/21/2012 "3 times/wk I have a couple mixed drinks"  . Drug Use: No  . Sexual Activity: No   Other Topics Concern  . Not on file   Social History Narrative  . No narrative on file    Review of systems: She denies fatigue. As reported above occasional rapid palpitations associated with chest heaviness and shortness of breath. The patient specifically denies any chest pain at rest or with exertion, dyspnea at rest or with exertion, orthopnea, paroxysmal nocturnal dyspnea, syncope, palpitations, focal neurological deficits, intermittent claudication, lower extremity edema, unexplained weight gain, cough, hemoptysis or wheezing.  The patient also denies abdominal pain, nausea, vomiting, dysphagia, diarrhea, constipation, polyuria, polydipsia, dysuria, hematuria, frequency, urgency, abnormal bleeding or bruising, fever, chills, unexpected weight changes, mood swings, change in skin or hair texture, change in voice quality, auditory or visual problems, allergic reactions  or rashes, new musculoskeletal complaints other than usual "aches and pains".   PHYSICAL EXAM BP 136/84  Pulse 66  Resp 12  Ht 5' 4.5" (1.638 m)  Wt 152 lb 12.8 oz (69.31 kg)  BMI 25.83 kg/m2  General: Alert, oriented x3, no distress Head: no evidence of trauma, PERRL, EOMI, no exophtalmos or lid lag, no myxedema, no xanthelasma; normal ears, nose and oropharynx Neck: normal jugular venous pulsations and no hepatojugular reflux; brisk carotid pulses without delay and no carotid bruits Chest: clear to auscultation, no signs of consolidation by percussion or palpation, normal fremitus, symmetrical and full respiratory excursions; healthy left subclavian pacemaker site Cardiovascular: normal position and quality of the apical impulse, regular rhythm, normal first and second heart sounds, no  rubs or gallops, and grade 1/6 systolic murmur crescendo decrescendo heard best up and down the  left sternal border Abdomen: no tenderness or distention, no masses by palpation, no abnormal pulsatility or arterial bruits, normal bowel sounds, no hepatosplenomegaly Extremities: no clubbing, cyanosis or edema; 2+ radial, ulnar and brachial pulses bilaterally; 2+ right femoral, posterior tibial and dorsalis pedis pulses; 2+ left femoral, posterior tibial and dorsalis pedis pulses; no subclavian or femoral bruits Neurological: grossly nonfocal   EKG: Atrial paced ventricular sensed, nonspecific ST segment abnormality, unchanged  Lipid Panel  November 2013 total cholesterol 217, triglycerides 182, HDL 44, LDL 137  Ionized calcium 1.41 total calcium 10.8 ounces stable slightly high) and normal phosphorus 2.9 Elevated intact parathyroid hormone 158.9 normal PTH related protein  BMET    Component Value Date/Time   NA 136 06/03/2012 1441   K 4.3 06/03/2012 1441   CL 101 06/03/2012 1441   CO2 28 06/03/2012 1441   GLUCOSE 117* 06/03/2012 1441   BUN 15 06/03/2012 1441   CREATININE 0.83 06/03/2012 1441   CREATININE 1.05 11/17/2011 1701   CALCIUM 10.6* 06/03/2012 1441   GFRNONAA 69* 06/03/2012 1441   GFRAA 80* 06/03/2012 1441     ASSESSMENT AND PLAN PAF (paroxysmal atrial fibrillation) Recently her episodes of atrial fibrillation have become symptomatic. This appears to be secondary to the fact that she now has rapid ventricular response. In the past she had lengthy episodes of atrial fibrillation with controlled rates that she was unaware of. I'm not sure why the ventricular rate has increased. She reports compliance with her medications including metoprolol succinate. No other signs or symptoms of hyperthyroidism. The overall burden of atrial fibrillation is however quite low (less than 1%) and antiarrhythmic therapy does not appear to be immediately justified. We'll increase the Toprol-XL to 50 mg once daily. Followup in 3 months with an in office device checked at that time, to assess adequacy of  ventricular rate control and adjust medications further if needed. If her blood pressure drops too much with the increase in beta blocker there is room to reduce the dose of her other antihypertensive medications. Continue Rivaroxaban.  S/P placement of cardiac pacemaker, medtronic adapta 01/21/12 Normal device function. Battery is still at beginning of life. There is 87% atrial pacing and almost no ventricular pacing. Heart rate histogram distribution is fair. The lead parameters are normal. Please see separate pacemaker noted.  HYPERLIPIDEMIA Reports intolerance to statins. She is unwilling to try a different agent  PVD (peripheral vascular disease), hx stents to bil SFAs 02/2010 It is probably time to reevaluate the restenotic lesion in her left thigh.  CORONARY ARTERY DISEASE, nonobstructive at cath 2001 20% LAD, negative Nuc stress 2012 If chest heaviness persists after  control of rapid ventricular rates, consider repeat myocardial perfusion imaging.   Orders Placed This Encounter  Procedures  . EKG 12-Lead   Meds ordered this encounter  Medications  . Multiple Vitamin (MULTIVITAMIN) tablet    Sig: Take 1 tablet by mouth daily.  . vitamin B-12 (CYANOCOBALAMIN) 1000 MCG tablet    Sig: Take 1,000 mcg by mouth daily.  . metoprolol succinate (TOPROL-XL) 50 MG 24 hr tablet    Sig: Take 1 tablet (50 mg total) by mouth daily. Take with or immediately following a meal.    Dispense:  30 tablet    Refill:  3    Tarren Velardi  Thurmon Fair, MD, [facility] [phone] office [phone] pager

## 2012-11-17 ENCOUNTER — Encounter: Payer: Self-pay | Admitting: Cardiovascular Disease

## 2012-11-18 ENCOUNTER — Ambulatory Visit (HOSPITAL_COMMUNITY)
Admission: RE | Admit: 2012-11-18 | Discharge: 2012-11-18 | Disposition: A | Discharge status: Home / Self Care | Payer: Medicare Other | Source: Ambulatory Visit | Attending: Internal Medicine | Admitting: Internal Medicine

## 2012-11-18 ENCOUNTER — Other Ambulatory Visit (HOSPITAL_COMMUNITY): Payer: Self-pay | Admitting: Cardiovascular Disease

## 2012-11-18 DIAGNOSIS — I739 Peripheral vascular disease, unspecified: Secondary | ICD-10-CM

## 2012-11-18 DIAGNOSIS — I70219 Atherosclerosis of native arteries of extremities with intermittent claudication, unspecified extremity: Secondary | ICD-10-CM

## 2012-11-18 NOTE — Progress Notes (Signed)
Arterial Duplex Lower Ext. Completed. Latravia Southgate, BS, RDMS, RVT  

## 2012-11-23 ENCOUNTER — Telehealth: Payer: Self-pay | Admitting: Cardiovascular Disease

## 2012-11-23 NOTE — Telephone Encounter (Signed)
LM w/lower extremity doppler results.

## 2012-11-23 NOTE — Progress Notes (Signed)
[facility] w/husband for doppler results

## 2012-11-23 NOTE — Telephone Encounter (Signed)
Returning your call. °

## 2012-11-28 ENCOUNTER — Telehealth: Payer: Self-pay | Admitting: Cardiovascular Disease

## 2012-11-28 MED ORDER — RIVAROXABAN 20 MG PO TABS: 20.0000 mg | ORAL_TABLET | Freq: Every day | ORAL | Status: DC

## 2012-11-28 NOTE — Telephone Encounter (Signed)
Needs samples xarelto 20 mg

## 2012-11-28 NOTE — Telephone Encounter (Signed)
Returned call.  Informed husband no samples available.  Verbalized understanding and will inform pt.  Asked that Rx be sent to pharmacy.  Will call back to check for samples as financially pt unable to afford Rx.   Refill(s) sent to pharmacy.

## 2012-11-30 ENCOUNTER — Telehealth: Payer: Self-pay | Admitting: *Deleted

## 2012-11-30 MED ORDER — RIVAROXABAN 20 MG PO TABS: 20.0000 mg | ORAL_TABLET | Freq: Every day | ORAL | Status: DC

## 2012-11-30 NOTE — Telephone Encounter (Signed)
Samples of Xarelto 20 mg available.  Call to pt on mobile number and verified x 2.  Pt informed samples now available and will be left at front desk.  Pt verbalized understanding and agreed w/ plan.

## 2012-12-05 ENCOUNTER — Telehealth: Payer: Self-pay

## 2012-12-05 MED ORDER — CITALOPRAM HYDROBROMIDE 20 MG PO TABS: 20.0000 mg | ORAL_TABLET | Freq: Every day | ORAL | Status: DC

## 2012-12-05 NOTE — Telephone Encounter (Signed)
Rx was sent to pharmacy electronically.  Per Wilburt Finlay, PA, okay to refill #90 w/ 0 RF. Future refills must come from patient's primary care physician.

## 2012-12-15 ENCOUNTER — Other Ambulatory Visit: Payer: Self-pay

## 2013-01-25 ENCOUNTER — Ambulatory Visit (INDEPENDENT_AMBULATORY_CARE_PROVIDER_SITE_OTHER): Payer: Medicare Other | Admitting: Cardiovascular Disease

## 2013-01-25 ENCOUNTER — Encounter: Payer: Self-pay | Admitting: Cardiovascular Disease

## 2013-01-25 VITALS — BP 130/78 | HR 76 | Ht 64.0 in | Wt 153.4 lb

## 2013-01-25 DIAGNOSIS — Z950 Presence of cardiac pacemaker: Secondary | ICD-10-CM

## 2013-01-25 DIAGNOSIS — I2510 Atherosclerotic heart disease of native coronary artery without angina pectoris: Secondary | ICD-10-CM

## 2013-01-25 DIAGNOSIS — I4892 Unspecified atrial flutter: Secondary | ICD-10-CM

## 2013-01-25 DIAGNOSIS — I480 Paroxysmal atrial fibrillation: Secondary | ICD-10-CM

## 2013-01-25 DIAGNOSIS — I498 Other specified cardiac arrhythmias: Secondary | ICD-10-CM

## 2013-01-25 DIAGNOSIS — I495 Sick sinus syndrome: Secondary | ICD-10-CM

## 2013-01-25 DIAGNOSIS — I4891 Unspecified atrial fibrillation: Secondary | ICD-10-CM

## 2013-01-25 DIAGNOSIS — E785 Hyperlipidemia, unspecified: Secondary | ICD-10-CM

## 2013-01-25 DIAGNOSIS — I739 Peripheral vascular disease, unspecified: Secondary | ICD-10-CM

## 2013-01-25 DIAGNOSIS — R001 Bradycardia, unspecified: Secondary | ICD-10-CM

## 2013-01-25 DIAGNOSIS — I10 Essential (primary) hypertension: Secondary | ICD-10-CM

## 2013-01-25 LAB — MDC_IDC_ENUM_SESS_TYPE_INCLINIC
Battery Impedance: 110 Ohm
Battery Remaining Longevity: 13
Battery Voltage: 2.78 V
Brady Statistic AP VP Percent: 1.1 %
Brady Statistic AP VS Percent: 90.8 %
Brady Statistic AS VP Percent: 0.1 % — CL
Brady Statistic AS VS Percent: 8.1 %
Lead Channel Impedance Value: 430 Ohm
Lead Channel Impedance Value: 564 Ohm
Lead Channel Pacing Threshold Amplitude: 0.5 V
Lead Channel Pacing Threshold Amplitude: 0.5 V
Lead Channel Pacing Threshold Pulse Width: 0.4 ms
Lead Channel Pacing Threshold Pulse Width: 0.4 ms
Lead Channel Sensing Intrinsic Amplitude: 15.68 mV
Lead Channel Sensing Intrinsic Amplitude: 2.8 mV
Lead Channel Setting Pacing Amplitude: 1.5 V
Lead Channel Setting Pacing Amplitude: 2 V
Lead Channel Setting Pacing Pulse Width: 0.4 ms
Lead Channel Setting Sensing Sensitivity: 5.6 mV

## 2013-01-25 LAB — PACEMAKER DEVICE OBSERVATION

## 2013-01-25 MED ORDER — RIVAROXABAN 20 MG PO TABS: 20.0000 mg | ORAL_TABLET | Freq: Every day | ORAL | Status: DC

## 2013-01-25 MED ORDER — METOPROLOL SUCCINATE ER 100 MG PO TB24: 100.0000 mg | ORAL_TABLET | Freq: Every day | ORAL | Status: DC

## 2013-01-25 NOTE — Patient Instructions (Addendum)
Your physician has recommended you make the following change in your medication: TAKE YOUR METOPROLOL 50 MG TWICE DAILY UNTIL YOU RUN OUT OF YOUR CURRENT BOTTLE THEN TAKE 100 MG ONCE DAILY OF THE NEW METOPROLOL PRESCRIPTION.   Remote monitoring is used to monitor your pacemaker from home. This monitoring reduces the number of office visits required to check your device to one time per year. It allows [place] to keep an eye on the functioning of your device to ensure it is working properly. You are scheduled for a device check from home on 04-28-2013. You may send your transmission at any time that day. If you have a wireless device, the transmission will be sent automatically. After your physician reviews your transmission, you will receive a postcard with your next transmission date.    Your physician recommends that you schedule a follow-up appointment in: 6 months with Dr.Croitoru

## 2013-01-27 NOTE — Progress Notes (Signed)
Patient ID: [name], female   DOB: April 12, 1940, 72 y.o.   MRN: [id number]      Reason for office visit Purposes nitroglycerin fibrillation, pacemaker fall, hypertension, hyperlipidemia, PAD  Mrs. [name] has done well since her last visit and pacemaker check. She has had a few episodes of atrial fibrillation lasting for a few hours. Ventricular rate control is not adequate. She does not have bleeding complications while on anticoagulation with Xarelto. Has not had stroke or TIA in the past. Compressive and of his pacemaker check shows normal device function. She has previously had stents in both superficial femoral arteries. At this point she does not have any intermittent claudication or other signs of arterial insufficiency. She is on antihypertensive medications. Unfortunately she is intolerant to statins. She had minimal coronary artery atherosclerosis by cardiac catheterization in 2001 and had a normal nuclear perfusion study in 2012. Chest normal left ventricular systolic function. Significant comorbidities include primary hyperparathyroidism and intolerance to the program(which she believes causes arthralgias).   Allergies  Allergen Reactions  . Clonidine Derivatives     Lowers heart rate  . Plavix [Clopidogrel Bisulfate]     Joint pains  . Exforge [Amlodipine Besylate-Valsartan] Itching and Rash    Current Outpatient Prescriptions  Medication Sig Dispense Refill  . amLODipine (NORVASC) 5 MG tablet Take 1 tablet (5 mg total) by mouth daily.  30 tablet  11  . aspirin 81 MG EC tablet Take 81 mg by mouth daily.       . citalopram (CELEXA) 20 MG tablet Take 1 tablet (20 mg total) by mouth daily.  90 tablet  0  . losartan (COZAAR) 50 MG tablet Take 1 tablet (50 mg total) by mouth daily.  30 tablet  11  . Multiple Vitamin (MULTIVITAMIN) tablet Take 1 tablet by mouth daily.      Marland Kitchen pyridOXINE (VITAMIN B-6) 100 MG tablet Take 300 mg by mouth daily.       . Rivaroxaban (XARELTO) 20 MG  TABS tablet Take 1 tablet (20 mg total) by mouth daily with breakfast.  7 tablet  0  . vitamin B-12 (CYANOCOBALAMIN) 1000 MCG tablet Take 1,000 mcg by mouth daily.      . metoprolol succinate (TOPROL-XL) 100 MG 24 hr tablet Take 1 tablet (100 mg total) by mouth daily. Take with or immediately following a meal.  90 tablet  3   No current facility-administered medications for this visit.    Past Medical History  Diagnosis Date  . GLUCOSE INTOLERANCE 12/31/2009  . HYPERLIPIDEMIA 12/31/2009  . ANXIETY 12/31/2009  . DEPRESSION 12/31/2009  . MITRAL REGURGITATION 12/31/2009  . HYPERTENSION 12/31/2009  . CORONARY ARTERY DISEASE 12/31/2009  . MITRAL VALVE PROLAPSE 12/31/2009  . MENOPAUSE, EARLY 12/31/2009  . Unspecified urinary incontinence 12/31/2009  . ABDOMINAL PAIN, LOWER 12/31/2009  . DIVERTICULITIS, HX OF 12/31/2009  . Pacemaker 01/21/2012    dual chamber  . Tachycardia-bradycardia syndrome     Hattie Perch 01/21/2012  . PAD (peripheral artery disease)   . Heart murmur     "used to say I did" (01/21/2012)  . OSTEOARTHRITIS, HAND 12/31/2009  . DISC DISEASE, CERVICAL 12/31/2009  . DISC DISEASE, LUMBAR 12/31/2009  . Symptomatic sinus bradycardia 01/22/2012  . PAF (paroxysmal atrial fibrillation) 01/22/2012  . Sinus node dysfunction 01/22/2012  . S/P placement of cardiac pacemaker, medtronic adapta 01/21/12 01/22/2012  . PVD (peripheral vascular disease), hx stents to bil SFAs 02/2010 01/22/2012  . PAD (peripheral artery disease)     Past  Surgical History  Procedure Laterality Date  . Cardiac catheterization  2005    "negative" (01/21/2012)  . Oophorectomy  ~1979  . Partial colectomy  2010  . Carpal tunnel release  2008    "right hand/thumb; carpal tunnel repair; got rid of arthritis" (01/21/2012)  . Insert / replace / remove pacemaker  01/21/2012    initial placement  . Vaginal hysterectomy  1975  . Posterior cervical laminectomy  1985  . Peripheral arterial stent graft  2012;  2012    "LLE; RLE" (01/21/2012)  . Facelift, lower 2/3  1995    "mini" (01/21/2012)  . Augmentation mammaplasty  1983  . Pacemaker insertion  01/21/2012    Medtronic Adapta, model# ADDRL1, serial# [id number]  . Lower arterial examination  10/28/2011    R. SFA stent mild-moderate mixed density plaque with elevated velocities consistent with 50% diameter reduction. L. SFA stent moderate mixed denisty plaque at mid to distal level consistent with 50-69% diameter reduction.  . Cardiac catheterization  09/17/1999    Mild CAD involving proximal portion of LAD. Mitral valve prolapse w/ mitral regurg. No evidence of renal artery stenosis. Recommended readjustment of antihypertensive medications.  . Persantine myoview stress test  02/25/2010    No scintigraphic evidence of inducible myocardial ischemia. No persantine EKG changes. Non-diagnositc for ishcemia. Low-risk, normal scan.    Family History  Problem Relation Age of Onset  . Heart disease Brother     3 brothers with heart disease (2 also with CVA)    History   Social History  . Marital Status: Married    Spouse Name: N/A    Number of Children: 3  . Years of Education: N/A   Occupational History  . retired Psychologist, counselling    Social History Main Topics  . Smoking status: Former Smoker -- 0.75 packs/day for 10 years    Types: Cigarettes  . Smokeless tobacco: Never Used     Comment: 01/21/2012 "quit smoking ~ 2002"  . Alcohol Use: 9.0 oz/week    15 Shots of liquor per week     Comment: 01/21/2012 "3 times/wk I have a couple mixed drinks"  . Drug Use: No  . Sexual Activity: No   Other Topics Concern  . Not on file   Social History Narrative  . No narrative on file    Review of systems: The patient specifically denies any chest pain at rest or with exertion, dyspnea at rest or with exertion, orthopnea, paroxysmal nocturnal dyspnea, syncope, palpitations, focal neurological deficits, intermittent claudication, lower extremity  edema, unexplained weight gain, cough, hemoptysis or wheezing.  The patient also denies abdominal pain, nausea, vomiting, dysphagia, diarrhea, constipation, polyuria, polydipsia, dysuria, hematuria, frequency, urgency, abnormal bleeding or bruising, fever, chills, unexpected weight changes, mood swings, change in skin or hair texture, change in voice quality, auditory or visual problems, allergic reactions or rashes, new musculoskeletal complaints other than usual "aches and pains".   PHYSICAL EXAM BP 130/78  Pulse 76  Ht 5\' 4"  (1.626 m)  Wt 153 lb 6.4 oz (69.582 kg)  BMI 26.32 kg/m2  General: Alert, oriented x3, no distress Head: no evidence of trauma, PERRL, EOMI, no exophtalmos or lid lag, no myxedema, no xanthelasma; normal ears, nose and oropharynx Neck: normal jugular venous pulsations and no hepatojugular reflux; brisk carotid pulses without delay and no carotid bruits Chest: clear to auscultation, no signs of consolidation by percussion or palpation, normal fremitus, symmetrical and full respiratory excursions; healthy pacemaker site Cardiovascular: normal position and quality  of the apical impulse, regular rhythm, normal first and second heart sounds, no rubs or gallops, grade 1/6 systolic murmur atrial paced ventricular sensed Abdomen: no tenderness or distention, no masses by palpation, no abnormal pulsatility or arterial bruits, normal bowel sounds, no hepatosplenomegaly Extremities: no clubbing, cyanosis or edema; 2+ radial, ulnar and brachial pulses bilaterally; 2+ right femoral, posterior tibial and dorsalis pedis pulses; 2+ left femoral, posterior tibial and dorsalis pedis pulses; no subclavian or femoral bruits Neurological: grossly nonfocal   EKG: Atrial paced ventricular sensed  Lipid Panel needs to be rechecked; her most recent assay from November 2013 showed a total cholesterol 217, triglycerides 182, HDL 44, LDL 137, creatinine 0.7, glucose 110, calcium 10.8 No results  found for this basename: chol, trig, hdl, cholhdl, vldl, ldlcalc    BMET    Component Value Date/Time   NA 136 06/03/2012 1441   K 4.3 06/03/2012 1441   CL 101 06/03/2012 1441   CO2 28 06/03/2012 1441   GLUCOSE 117* 06/03/2012 1441   BUN 15 06/03/2012 1441   CREATININE 0.83 06/03/2012 1441   CREATININE 1.05 11/17/2011 1701   CALCIUM 10.6* 06/03/2012 1441   GFRNONAA 69* 06/03/2012 1441   GFRAA 80* 06/03/2012 1441     ASSESSMENT AND PLAN HYPERLIPIDEMIA Unfortunately she has not been able to tolerate statins and does not want to try any other lipid-lowering agents. She already has established peripheral vascular disease. Reinforce need for lifestyle changes such as weight loss, carbohydrate and saturated fat restriction and regular physical exercise  S/P placement of cardiac pacemaker, medtronic adapta 01/21/12 Normal device function. No permanent reprogramming changes were made. She has a high frequency of atrial pacing but virtually no ventricular pacing.  PAF (paroxysmal atrial fibrillation) Continue anticoagulation with Xarelto. If necessary this can be interpreted 24 hours before a minor surgical procedure in 48 hours before a major surgery. GEN is not necessary. Rate control is inadequate. We'll increase metoprolol to a total of 100 mg daily.  PVD (peripheral vascular disease), hx stents to bil SFAs 02/2010 Asymptomatic. Her recent duplex study shows no evidence of progression over the last year  HYPERTENSION Well-controlled  Orders Placed This Encounter  Procedures  . EKG 12-Lead   Meds ordered this encounter  Medications  . metoprolol succinate (TOPROL-XL) 100 MG 24 hr tablet    Sig: Take 1 tablet (100 mg total) by mouth daily. Take with or immediately following a meal.    Dispense:  90 tablet    Refill:  3  . Rivaroxaban (XARELTO) 20 MG TABS tablet    Sig: Take 1 tablet (20 mg total) by mouth daily with breakfast.    Dispense:  7 tablet    Refill:  0    Lot #: [id number]  [id number]    Order Specific Question:  Supervising Provider    Answer:  Nicki Guadalajara A [[id number]]    Junious Silk, MD, [facility] [phone] office [phone] pager

## 2013-01-27 NOTE — Assessment & Plan Note (Signed)
Well controlled 

## 2013-01-27 NOTE — Assessment & Plan Note (Signed)
Unfortunately she has not been able to tolerate statins and does not want to try any other lipid-lowering agents. She already has established peripheral vascular disease. Reinforce need for lifestyle changes such as weight loss, carbohydrate and saturated fat restriction and regular physical exercise

## 2013-01-27 NOTE — Assessment & Plan Note (Signed)
Normal device function. No permanent reprogramming changes were made. She has a high frequency of atrial pacing but virtually no ventricular pacing.

## 2013-01-27 NOTE — Assessment & Plan Note (Signed)
Continue anticoagulation with Xarelto. If necessary this can be interpreted 24 hours before a minor surgical procedure in 48 hours before a major surgery. GEN is not necessary. Rate control is inadequate. We'll increase metoprolol to a total of 100 mg daily.

## 2013-01-27 NOTE — Assessment & Plan Note (Signed)
Asymptomatic. Her recent duplex study shows no evidence of progression over the last year

## 2013-01-27 NOTE — Progress Notes (Signed)
Patient ID: [name], female   DOB: 05-07-1940, 71 y.o.   MRN: [id number]      Reason for office visit Paroxysmal atrial fibrillation, pacemaker followup  [name] has done well since her last pacemaker check in September. She has a history of sinus node dysfunction with symptomatic bradycardia as well as paroxysmal atrial fibrillation with tachycardia-bradycardia syndrome for which a dual chamber permanent pacemaker was implanted in December of 2013. She does not have a history of stroke or embolic events and has not had serious bleeding complication while on anticoagulation with Xarelto. She is unaware of palpitations but her device shows occasional episodes of paroxysmal atrial fibrillation, often lasting for hours. Rate control during episodes of atrial fibrillation is medial per with ventricular rates that average 120 beats per minutes. She denies syncope, chest pain, dyspnea, or edema or focal neurological deficits. She does not have significant structural heart disease. She underwent cardiac catheterization in 2001 that showed minimal luminal irregularities. Her most recent left ventricular ejection fraction was estimated to be 80% of the time of her nuclear stress test in 2012. There were no perfusion abnormalities. She does not have significant valvular problems. She has treated hypertension and hyperlipidemia, but unfortunately is statin intolerant and does have peripheral vascular disease with a previous stent to both superficial femoral arteries. Duplex ultrasonography performed in October has shown less than 50% stenosis in the right SFA and 50-69% stenosis within the left SFA stent, unchanged from 1 year earlier.   Allergies  Allergen Reactions  . Clonidine Derivatives     Lowers heart rate  . Plavix [Clopidogrel Bisulfate]     Joint pains  . Exforge [Amlodipine Besylate-Valsartan] Itching and Rash    Current Outpatient Prescriptions  Medication Sig Dispense Refill  .  amLODipine (NORVASC) 5 MG tablet Take 1 tablet (5 mg total) by mouth daily.  30 tablet  11  . aspirin 81 MG EC tablet Take 81 mg by mouth daily.       . citalopram (CELEXA) 20 MG tablet Take 1 tablet (20 mg total) by mouth daily.  90 tablet  0  . losartan (COZAAR) 50 MG tablet Take 1 tablet (50 mg total) by mouth daily.  30 tablet  11  . Multiple Vitamin (MULTIVITAMIN) tablet Take 1 tablet by mouth daily.      Marland Kitchen pyridOXINE (VITAMIN B-6) 100 MG tablet Take 300 mg by mouth daily.       . Rivaroxaban (XARELTO) 20 MG TABS tablet Take 1 tablet (20 mg total) by mouth daily with breakfast.  7 tablet  0  . vitamin B-12 (CYANOCOBALAMIN) 1000 MCG tablet Take 1,000 mcg by mouth daily.      . metoprolol succinate (TOPROL-XL) 100 MG 24 hr tablet Take 1 tablet (100 mg total) by mouth daily. Take with or immediately following a meal.  90 tablet  3   No current facility-administered medications for this visit.    Past Medical History  Diagnosis Date  . GLUCOSE INTOLERANCE 12/31/2009  . HYPERLIPIDEMIA 12/31/2009  . ANXIETY 12/31/2009  . DEPRESSION 12/31/2009  . MITRAL REGURGITATION 12/31/2009  . HYPERTENSION 12/31/2009  . CORONARY ARTERY DISEASE 12/31/2009  . MITRAL VALVE PROLAPSE 12/31/2009  . MENOPAUSE, EARLY 12/31/2009  . Unspecified urinary incontinence 12/31/2009  . ABDOMINAL PAIN, LOWER 12/31/2009  . DIVERTICULITIS, HX OF 12/31/2009  . Pacemaker 01/21/2012    dual chamber  . Tachycardia-bradycardia syndrome     Hattie Perch 01/21/2012  . PAD (peripheral artery disease)   .  Heart murmur     "used to say I did" (01/21/2012)  . OSTEOARTHRITIS, HAND 12/31/2009  . DISC DISEASE, CERVICAL 12/31/2009  . DISC DISEASE, LUMBAR 12/31/2009  . Symptomatic sinus bradycardia 01/22/2012  . PAF (paroxysmal atrial fibrillation) 01/22/2012  . Sinus node dysfunction 01/22/2012  . S/P placement of cardiac pacemaker, medtronic adapta 01/21/12 01/22/2012  . PVD (peripheral vascular disease), hx stents to bil SFAs  02/2010 01/22/2012  . PAD (peripheral artery disease)     Past Surgical History  Procedure Laterality Date  . Cardiac catheterization  2005    "negative" (01/21/2012)  . Oophorectomy  ~1979  . Partial colectomy  2010  . Carpal tunnel release  2008    "right hand/thumb; carpal tunnel repair; got rid of arthritis" (01/21/2012)  . Insert / replace / remove pacemaker  01/21/2012    initial placement  . Vaginal hysterectomy  1975  . Posterior cervical laminectomy  1985  . Peripheral arterial stent graft  2012; 2012    "LLE; RLE" (01/21/2012)  . Facelift, lower 2/3  1995    "mini" (01/21/2012)  . Augmentation mammaplasty  1983  . Pacemaker insertion  01/21/2012    Medtronic Adapta, model# ADDRL1, serial# [id number]  . Lower arterial examination  10/28/2011    R. SFA stent mild-moderate mixed density plaque with elevated velocities consistent with 50% diameter reduction. L. SFA stent moderate mixed denisty plaque at mid to distal level consistent with 50-69% diameter reduction.  . Cardiac catheterization  09/17/1999    Mild CAD involving proximal portion of LAD. Mitral valve prolapse w/ mitral regurg. No evidence of renal artery stenosis. Recommended readjustment of antihypertensive medications.  . Persantine myoview stress test  02/25/2010    No scintigraphic evidence of inducible myocardial ischemia. No persantine EKG changes. Non-diagnositc for ishcemia. Low-risk, normal scan.    Family History  Problem Relation Age of Onset  . Heart disease Brother     3 brothers with heart disease (2 also with CVA)    History   Social History  . Marital Status: Married    Spouse Name: N/A    Number of Children: 3  . Years of Education: N/A   Occupational History  . retired Psychologist, counselling    Social History Main Topics  . Smoking status: Former Smoker -- 0.75 packs/day for 10 years    Types: Cigarettes  . Smokeless tobacco: Never Used     Comment: 01/21/2012 "quit smoking ~ 2002"  .  Alcohol Use: 9.0 oz/week    15 Shots of liquor per week     Comment: 01/21/2012 "3 times/wk I have a couple mixed drinks"  . Drug Use: No  . Sexual Activity: No   Other Topics Concern  . Not on file   Social History Narrative  . No narrative on file    Review of systems: The patient specifically denies any chest pain at rest or with exertion, dyspnea at rest or with exertion, orthopnea, paroxysmal nocturnal dyspnea, syncope, palpitations, focal neurological deficits, intermittent claudication, lower extremity edema, unexplained weight gain, cough, hemoptysis or wheezing.  The patient also denies abdominal pain, nausea, vomiting, dysphagia, diarrhea, constipation, polyuria, polydipsia, dysuria, hematuria, frequency, urgency, abnormal bleeding or bruising, fever, chills, unexpected weight changes, mood swings, change in skin or hair texture, change in voice quality, auditory or visual problems, allergic reactions or rashes, new musculoskeletal complaints other than usual "aches and pains".   PHYSICAL EXAM BP 130/78  Pulse 76  Ht 5\' 4"  (1.626 m)  Wt  153 lb 6.4 oz (69.582 kg)  BMI 26.32 kg/m2  General: Alert, oriented x3, no distress Head: no evidence of trauma, PERRL, EOMI, no exophtalmos or lid lag, no myxedema, no xanthelasma; normal ears, nose and oropharynx Neck: normal jugular venous pulsations and no hepatojugular reflux; brisk carotid pulses without delay and no carotid bruits Chest: clear to auscultation, no signs of consolidation by percussion or palpation, normal fremitus, symmetrical and full respiratory excursions; the left subclavian pacemaker site looks healthy Cardiovascular: normal position and quality of the apical impulse, regular rhythm, normal first and second heart sounds, no rubs or gallops, 1/6 systolic murmur Abdomen: no tenderness or distention, no masses by palpation, no abnormal pulsatility or arterial bruits, normal bowel sounds, no  hepatosplenomegaly Extremities: no clubbing, cyanosis or edema; 2+ radial, ulnar and brachial pulses bilaterally; 2+ right femoral, posterior tibial and dorsalis pedis pulses; 2+ left femoral, posterior tibial and dorsalis pedis pulses; no subclavian or femoral bruits Neurological: grossly nonfocal   EKG: Atrial paced ventricular sensed  Lipid Panel November 2013 total cholesterol 217, triglycerides 182, HDL 44, LDL 137   BMET    Component Value Date/Time   NA 136 06/03/2012 1441   K 4.3 06/03/2012 1441   CL 101 06/03/2012 1441   CO2 28 06/03/2012 1441   GLUCOSE 117* 06/03/2012 1441   BUN 15 06/03/2012 1441   CREATININE 0.83 06/03/2012 1441   CREATININE 1.05 11/17/2011 1701   CALCIUM 10.6* 06/03/2012 1441   GFRNONAA 69* 06/03/2012 1441   GFRAA 80* 06/03/2012 1441     ASSESSMENT AND PLAN HYPERLIPIDEMIA Unfortunately she has not been able to tolerate statins and does not want to try any other lipid-lowering agents. She already has established peripheral vascular disease. Reinforce need for lifestyle changes such as weight loss, carbohydrate and saturated fat restriction and regular physical exercise  S/P placement of cardiac pacemaker, medtronic adapta 01/21/12 Normal device function. No permanent reprogramming changes were made. She has a high frequency of atrial pacing but virtually no ventricular pacing.  PAF (paroxysmal atrial fibrillation) Continue anticoagulation with Xarelto. If necessary this can be interpreted 24 hours before a minor surgical procedure in 48 hours before a major surgery. GEN is not necessary. Rate control is inadequate. We'll increase metoprolol to a total of 100 mg daily.   Orders Placed This Encounter  Procedures  . EKG 12-Lead   Meds ordered this encounter  Medications  . metoprolol succinate (TOPROL-XL) 100 MG 24 hr tablet    Sig: Take 1 tablet (100 mg total) by mouth daily. Take with or immediately following a meal.    Dispense:  90 tablet    Refill:   3  . Rivaroxaban (XARELTO) 20 MG TABS tablet    Sig: Take 1 tablet (20 mg total) by mouth daily with breakfast.    Dispense:  7 tablet    Refill:  0    Lot #: [id number]    Order Specific Question:  Supervising Provider    Answer:  Nicki Guadalajara A [[id number]]    Junious Silk, MD, [facility] [phone] office [phone] pager

## 2013-02-14 ENCOUNTER — Other Ambulatory Visit: Payer: Self-pay | Admitting: *Deleted

## 2013-02-14 MED ORDER — METOPROLOL SUCCINATE ER 100 MG PO TB24: 100.0000 mg | ORAL_TABLET | Freq: Every day | ORAL | Status: DC

## 2013-02-14 MED ORDER — LOSARTAN POTASSIUM 50 MG PO TABS: 50.0000 mg | ORAL_TABLET | Freq: Every day | ORAL | Status: DC

## 2013-02-14 MED ORDER — AMLODIPINE BESYLATE 5 MG PO TABS: 5.0000 mg | ORAL_TABLET | Freq: Every day | ORAL | Status: DC

## 2013-02-14 NOTE — Telephone Encounter (Signed)
Rx was sent to pharmacy electronically. Citalopram refused

## 2013-02-20 ENCOUNTER — Other Ambulatory Visit: Payer: Self-pay | Admitting: *Deleted

## 2013-02-20 MED ORDER — RIVAROXABAN 20 MG PO TABS: 20.0000 mg | ORAL_TABLET | Freq: Every day | ORAL | Status: DC

## 2013-02-20 NOTE — Telephone Encounter (Signed)
Samples of Xarelto 20mg  #20 given.

## 2013-02-24 ENCOUNTER — Telehealth: Payer: Self-pay | Admitting: Physician Assistant

## 2013-02-24 NOTE — Telephone Encounter (Signed)
Need a new prescription for [facility] for her Citalopram HBR 20 mg # 90 and refills.

## 2013-02-24 NOTE — Telephone Encounter (Signed)
Returned call and pt verified x 2.  Pt informed message received and last refill had instructions to contact PCP for future refills.  Pt stated she remembers that now.  Pt verbalized understanding and agreed w/ plan.  Pt also stated her PCP has changed to Kathyrn Lass, MD w/ [facility].  Pt requested that be updated and informed RN will notify front desk staff.  Pt verbalized understanding and agreed w/ plan.

## 2013-03-24 ENCOUNTER — Telehealth: Payer: Self-pay | Admitting: Cardiovascular Disease

## 2013-03-24 MED ORDER — RIVAROXABAN 20 MG PO TABS: 20.0000 mg | ORAL_TABLET | Freq: Every day | ORAL | Status: DC

## 2013-03-24 NOTE — Telephone Encounter (Signed)
Need some samples of Xarelto 20 mg please.Could she get them before 12 if possible,have to go out of town her husband sister is sick.

## 2013-03-24 NOTE — Telephone Encounter (Signed)
Returned call and pt informed samples left at front desk.  Pt verbalized understanding and agreed w/ plan.    

## 2013-04-21 ENCOUNTER — Telehealth: Payer: Self-pay | Admitting: *Deleted

## 2013-04-21 ENCOUNTER — Encounter: Payer: Self-pay | Admitting: Cardiovascular Disease

## 2013-04-21 ENCOUNTER — Ambulatory Visit (INDEPENDENT_AMBULATORY_CARE_PROVIDER_SITE_OTHER): Payer: Commercial Managed Care - HMO | Admitting: Cardiovascular Disease

## 2013-04-21 VITALS — BP 122/70 | HR 91 | Resp 16 | Ht 64.5 in | Wt 159.0 lb

## 2013-04-21 DIAGNOSIS — Z950 Presence of cardiac pacemaker: Secondary | ICD-10-CM

## 2013-04-21 DIAGNOSIS — E785 Hyperlipidemia, unspecified: Secondary | ICD-10-CM

## 2013-04-21 DIAGNOSIS — I495 Sick sinus syndrome: Secondary | ICD-10-CM

## 2013-04-21 DIAGNOSIS — I480 Paroxysmal atrial fibrillation: Secondary | ICD-10-CM

## 2013-04-21 DIAGNOSIS — I739 Peripheral vascular disease, unspecified: Secondary | ICD-10-CM

## 2013-04-21 DIAGNOSIS — I4891 Unspecified atrial fibrillation: Secondary | ICD-10-CM

## 2013-04-21 DIAGNOSIS — R001 Bradycardia, unspecified: Secondary | ICD-10-CM

## 2013-04-21 DIAGNOSIS — E782 Mixed hyperlipidemia: Secondary | ICD-10-CM

## 2013-04-21 DIAGNOSIS — R0789 Other chest pain: Secondary | ICD-10-CM

## 2013-04-21 DIAGNOSIS — I2510 Atherosclerotic heart disease of native coronary artery without angina pectoris: Secondary | ICD-10-CM

## 2013-04-21 DIAGNOSIS — I498 Other specified cardiac arrhythmias: Secondary | ICD-10-CM

## 2013-04-21 DIAGNOSIS — Z79899 Other long term (current) drug therapy: Secondary | ICD-10-CM

## 2013-04-21 LAB — MDC_IDC_ENUM_SESS_TYPE_INCLINIC
Battery Impedance: 134 Ohm
Battery Impedance: 134 Ohm
Battery Remaining Longevity: 146 mo
Battery Remaining Longevity: 146 mo
Battery Voltage: 2.79 V
Battery Voltage: 2.79 V
Brady Statistic AP VP Percent: 1 %
Brady Statistic AP VP Percent: 1 %
Brady Statistic AP VS Percent: 94 %
Brady Statistic AP VS Percent: 94 %
Brady Statistic AS VP Percent: 0 %
Brady Statistic AS VP Percent: 0 %
Brady Statistic AS VS Percent: 5 %
Brady Statistic AS VS Percent: 5 %
Date Time Interrogation Session: 20150313121946
Date Time Interrogation Session: 20150313121946
Lead Channel Impedance Value: 397 Ohm
Lead Channel Impedance Value: 397 Ohm
Lead Channel Impedance Value: 536 Ohm
Lead Channel Impedance Value: 536 Ohm
Lead Channel Pacing Threshold Amplitude: 0.5 V
Lead Channel Pacing Threshold Amplitude: 0.5 V
Lead Channel Pacing Threshold Amplitude: 0.5 V
Lead Channel Pacing Threshold Amplitude: 0.5 V
Lead Channel Pacing Threshold Pulse Width: 0.4 ms
Lead Channel Pacing Threshold Pulse Width: 0.4 ms
Lead Channel Pacing Threshold Pulse Width: 0.4 ms
Lead Channel Pacing Threshold Pulse Width: 0.4 ms
Lead Channel Sensing Intrinsic Amplitude: 15.67 mV
Lead Channel Sensing Intrinsic Amplitude: 15.67 mV
Lead Channel Sensing Intrinsic Amplitude: 2.8 mV
Lead Channel Sensing Intrinsic Amplitude: 2.8 mV
Lead Channel Setting Pacing Amplitude: 2 V
Lead Channel Setting Pacing Amplitude: 2 V
Lead Channel Setting Pacing Amplitude: 2.5 V
Lead Channel Setting Pacing Amplitude: 2.5 V
Lead Channel Setting Pacing Pulse Width: 0.4 ms
Lead Channel Setting Pacing Pulse Width: 0.4 ms
Lead Channel Setting Sensing Sensitivity: 5.6 mV
Lead Channel Setting Sensing Sensitivity: 5.6 mV

## 2013-04-21 LAB — PACEMAKER DEVICE OBSERVATION

## 2013-04-21 NOTE — Assessment & Plan Note (Signed)
Normal pacemaker function. Says are her driven heart rates were increased today to help with her fatigue. No other device changes are made. She will continue with remote pacemaker checks via the [facility] system

## 2013-04-21 NOTE — Progress Notes (Signed)
Patient ID: [name], female   DOB: [birth year], 73 y.o.   MRN: [id number]     Reason for office visit Chest pressure, hypertension  Mrs. all tree is a 73 year old woman with a history of recurrent paroxysmal atrial fibrillation and sinus node dysfunction who received a dual-chamber permanent pacemaker in December of 2013 (Medtronic). She does not have known structural heart disease but did undergo coronary angiography in 2001 for new ECG abnormalities. She was found to have 20-30% lesions in the proximal LAD and mitral valve prolapse.  Interrogation of her pacemaker today shows normal function. She has had infrequent atrial fibrillation with an overall burden of only 0.2%. The last lengthy episode happened with every 8 and she had atrophic relation for about an hour and 15 minutes. Ventricular rate control was fair at 107 beats per minute. Her heart rate histograms are blunted and the sensor response was made more aggressive today.  Recently she has not been feeling well. She has low energy levels. When she tries to rush she develops chest pressure and fullness. This resolves when she rests. She thought it might be her blood pressure since her home blood pressure monitor was sometimes showing values of greater than 200/100 mm Hg. However, when we checked her blood pressure monitor in the office to date seems to over estimate her true blood pressure by about 40 mm of mercury. She developed some small "blood bumps" on her skin and thought that she might be allergic to her anticoagulant, Xarelto. She stopped it for a while and then started taking it only every other day but it doesn't seem to be any change in the rash. For the last 3 days she has been taking the usual once daily prescription. She feels that she is in a lot more physical and emotional stress as her husband's health and hearing and memory gradually deteriorate. He has had 2 car accident but insists on continuing to drive. He had to have his  cochlear implant adjusted recently.  Allergies  Allergen Reactions  . Clonidine Derivatives     Lowers heart rate  . Plavix [Clopidogrel Bisulfate]     Joint pains  . Exforge [Amlodipine Besylate-Valsartan] Itching and Rash    Current Outpatient Prescriptions  Medication Sig Dispense Refill  . amLODipine (NORVASC) 5 MG tablet Take 1 tablet (5 mg total) by mouth daily.  90 tablet  3  . aspirin 81 MG EC tablet Take 81 mg by mouth daily.       . citalopram (CELEXA) 20 MG tablet Take 1 tablet (20 mg total) by mouth daily.  90 tablet  0  . losartan (COZAAR) 50 MG tablet Take 1 tablet (50 mg total) by mouth daily.  90 tablet  3  . metoprolol succinate (TOPROL-XL) 100 MG 24 hr tablet Take 1 tablet (100 mg total) by mouth daily. Take with or immediately following a meal.  90 tablet  3  . Multiple Vitamin (MULTIVITAMIN) tablet Take 1 tablet by mouth daily.      Marland Kitchen pyridOXINE (VITAMIN B-6) 100 MG tablet Take 300 mg by mouth daily.       . Rivaroxaban (XARELTO) 20 MG TABS tablet Take 1 tablet (20 mg total) by mouth daily with breakfast.  30 tablet  0  . vitamin B-12 (CYANOCOBALAMIN) 1000 MCG tablet Take 1,000 mcg by mouth daily.       No current facility-administered medications for this visit.    Past Medical History  Diagnosis Date  . GLUCOSE  INTOLERANCE 12/31/2009  . HYPERLIPIDEMIA 12/31/2009  . ANXIETY 12/31/2009  . DEPRESSION 12/31/2009  . MITRAL REGURGITATION 12/31/2009  . HYPERTENSION 12/31/2009  . CORONARY ARTERY DISEASE 12/31/2009  . MITRAL VALVE PROLAPSE 12/31/2009  . MENOPAUSE, EARLY 12/31/2009  . Unspecified urinary incontinence 12/31/2009  . ABDOMINAL PAIN, LOWER 12/31/2009  . DIVERTICULITIS, HX OF 12/31/2009  . Pacemaker 01/21/2012    dual chamber  . Tachycardia-bradycardia syndrome     [name] 01/21/2012  . PAD (peripheral artery disease)   . Heart murmur     "used to say I did" (01/21/2012)  . OSTEOARTHRITIS, HAND 12/31/2009  . [place] DISEASE, CERVICAL 12/31/2009  .  [place] DISEASE, LUMBAR 12/31/2009  . Symptomatic sinus bradycardia 01/22/2012  . PAF (paroxysmal atrial fibrillation) 01/22/2012  . Sinus node dysfunction 01/22/2012  . S/P placement of cardiac pacemaker, medtronic adapta 01/21/12 01/22/2012  . PVD (peripheral vascular disease), hx stents to bil SFAs 02/2010 01/22/2012  . PAD (peripheral artery disease)     Past Surgical History  Procedure Laterality Date  . Cardiac catheterization  2005    "negative" (01/21/2012)  . Oophorectomy  ~1979  . Partial colectomy  2010  . Carpal tunnel release  2008    "right hand/thumb; carpal tunnel repair; got rid of arthritis" (01/21/2012)  . Insert / replace / remove pacemaker  01/21/2012    initial placement  . Vaginal hysterectomy  1975  . Posterior cervical laminectomy  1985  . Peripheral arterial stent graft  2012; 2012    "LLE; RLE" (01/21/2012)  . Facelift, lower 2/3  1995    "mini" (01/21/2012)  . Augmentation mammaplasty  1983  . Pacemaker insertion  01/21/2012    Medtronic Adapta, model# ADDRL1, serial# [id number]  . Lower arterial examination  10/28/2011    R. SFA stent mild-moderate mixed density plaque with elevated velocities consistent with 50% diameter reduction. L. SFA stent moderate mixed denisty plaque at mid to distal level consistent with 50-69% diameter reduction.  . Cardiac catheterization  09/17/1999    Mild CAD involving proximal portion of LAD. Mitral valve prolapse w/ mitral regurg. No evidence of renal artery stenosis. Recommended readjustment of antihypertensive medications.  . Persantine myoview stress test  02/25/2010    No scintigraphic evidence of inducible myocardial ischemia. No persantine EKG changes. Non-diagnositc for ishcemia. Low-risk, normal scan.    Family History  Problem Relation Age of Onset  . Heart disease Brother     3 brothers with heart disease (2 also with CVA)    History   Social History  . Marital Status: Married    Spouse Name: N/A    Number  of Children: 3  . Years of Education: N/A   Occupational History  . retired Chemical engineer    Social History Main Topics  . Smoking status: Former Smoker -- 0.75 packs/day for 10 years    Types: Cigarettes  . Smokeless tobacco: Never Used     Comment: 01/21/2012 "quit smoking ~ 2002"  . Alcohol Use: 9.0 oz/week    15 Shots of liquor per week     Comment: 01/21/2012 "3 times/wk I have a couple mixed drinks"  . Drug Use: No  . Sexual Activity: No   Other Topics Concern  . Not on file   Social History Narrative  . No narrative on file    Review of systems: Chest pressure/fullness when she hurries. No true exertional dyspnea but feels the need to occasionally take a deep breath with activity.  The patient specifically denies  orthopnea, paroxysmal nocturnal dyspnea, syncope, palpitations, focal neurological deficits, intermittent claudication, lower extremity edema, unexplained weight gain, cough, hemoptysis or wheezing.  The patient also denies abdominal pain, nausea, vomiting, dysphagia, diarrhea, constipation, polyuria, polydipsia, dysuria, hematuria, frequency, urgency, abnormal bleeding or bruising, fever, chills, unexpected weight changes, mood swings, change in skin or hair texture, change in voice quality, auditory or visual problems, allergic reactions or rashes, new musculoskeletal complaints other than usual "aches and pains".   PHYSICAL EXAM BP 122/70  Pulse 91  Resp 16  Ht 5' 4.5" (1.638 m)  Wt 72.122 kg (159 lb)  BMI 26.88 kg/m2  General: Alert, oriented x3, no distress Head: no evidence of trauma, PERRL, EOMI, no exophtalmos or lid lag, no myxedema, no xanthelasma; normal ears, nose and oropharynx Neck: normal jugular venous pulsations and no hepatojugular reflux; brisk carotid pulses without delay and no carotid bruits Chest: clear to auscultation, no signs of consolidation by percussion or palpation, normal fremitus, symmetrical and full respiratory  excursions, normal left subclavian pacemaker site Cardiovascular: normal position and quality of the apical impulse, regular rhythm, normal first and second heart sounds, no murmurs, rubs or gallops Abdomen: no tenderness or distention, no masses by palpation, no abnormal pulsatility or arterial bruits, normal bowel sounds, no hepatosplenomegaly Extremities: no clubbing, cyanosis or edema; 2+ radial, ulnar and brachial pulses bilaterally; 2+ right femoral, posterior tibial and dorsalis pedis pulses; 2+ left femoral, posterior tibial and dorsalis pedis pulses; no subclavian or femoral bruits Neurological: grossly nonfocal   EKG: Atrial paced ventricular sensed rhythm and nonspecific diffuse T-wave abnormality, unchanged from previous tracings  Her last functional study was in January of 2012 when she had a normal dipyridamole stress myocardial perfusion study   BMET    Component Value Date/Time   NA 136 06/03/2012 1441   K 4.3 06/03/2012 1441   CL 101 06/03/2012 1441   CO2 28 06/03/2012 1441   GLUCOSE 117* 06/03/2012 1441   BUN 15 06/03/2012 1441   CREATININE 0.83 06/03/2012 1441   CREATININE 1.05 11/17/2011 1701   CALCIUM 10.6* 06/03/2012 1441   GFRNONAA 69* 06/03/2012 1441   GFRAA 80* 06/03/2012 1441     ASSESSMENT AND PLAN CORONARY ARTERY DISEASE, nonobstructive at cath 2001 20% LAD, negative Nuc stress 2012 Her chest pressure now appears to be more consistently associated with physical exertion. He does not appear to be in direct relationship with episodes of atrial fibrillation. I recommended that she have a followup nuclear stress test. She is unable to walk on the treadmill and unlikely to achieve desired target heart rates because of her bradyarrhythmia. Recommend a [employer].  S/P placement of cardiac pacemaker, medtronic adapta 01/21/12 Normal pacemaker function. Says are her driven heart rates were increased today to help with her fatigue. No other device changes are made. She  will continue with remote pacemaker checks via the [facility] system  PAF (paroxysmal atrial fibrillation) The overall burden of it for fibrillation is quite low at only 0.2%. Ventricular rate control is fair if not optimal. I did not increase her beta blocker dose because of her complaints of fatigue and sense the atrial for relation is so infrequent. I asked her to take the Xarelto on a daily basis because it will not be protective otherwise. In fact the rapid swings in anticoagulant level may be more harmful than beneficial. If she truly has an allergic response is a relative we should switch to an alternative agent and I have asked her to inquire about  the relative cost of the other novel anticoagulants, since I'm not sure that we necessarily for one over another in her case.  PVD (peripheral vascular disease), hx stents to bil SFAs 02/2010 No current symptoms of intermittent claudication. The presence of PAD greatly increases the likelihood of developing coronary disease.  HYPERLIPIDEMIA The most recent lipid profile that I have available is from December of 2013. Will inquire if she has had more recent assay with her primary care physician. If not the levels should be rechecked. She really should be taking a statin, but was reluctant at our last appointment. We will discuss this again based on the repeat lab values in the results of her stress test   Orders Placed This Encounter  Procedures  . Myocardial Perfusion Imaging  . EKG 12-Lead   No orders of the defined types were placed in this encounter.    Holli Humbles, MD, [place] [phone] office [phone] pager

## 2013-04-21 NOTE — Assessment & Plan Note (Signed)
No current symptoms of intermittent claudication. The presence of PAD greatly increases the likelihood of developing coronary disease.

## 2013-04-21 NOTE — Telephone Encounter (Signed)
Lipid profile and CMET ordered and mailed request to patient.  LM for patient to have done FASTING at her convenience.

## 2013-04-21 NOTE — Telephone Encounter (Signed)
Returning your call. °

## 2013-04-21 NOTE — Assessment & Plan Note (Signed)
The most recent lipid profile that I have available is from December of 2013. Will inquire if she has had more recent assay with her primary care physician. If not the levels should be rechecked. She really should be taking a statin, but was reluctant at our last appointment. We will discuss this again based on the repeat lab values in the results of her stress test

## 2013-04-21 NOTE — Patient Instructions (Signed)
Your physician has requested that you have a lexiscan myoview. For further information please visit [email or web address]. Please follow instruction sheet, as given.  We will call you with the results.  Your physician recommends that you schedule a follow-up appointment in: 3 MONTHS with Dr. Sallyanne Kuster.

## 2013-04-21 NOTE — Telephone Encounter (Signed)
LM to have FASTING labs done prior to next visit.

## 2013-04-21 NOTE — Assessment & Plan Note (Signed)
The overall burden of it for fibrillation is quite low at only 0.2%. Ventricular rate control is fair if not optimal. I did not increase her beta blocker dose because of her complaints of fatigue and sense the atrial for relation is so infrequent. I asked her to take the Xarelto on a daily basis because it will not be protective otherwise. In fact the rapid swings in anticoagulant level may be more harmful than beneficial. If she truly has an allergic response is a relative we should switch to an alternative agent and I have asked her to inquire about the relative cost of the other novel anticoagulants, since I'm not sure that we necessarily for one over another in her case.

## 2013-04-21 NOTE — Assessment & Plan Note (Signed)
Her chest pressure now appears to be more consistently associated with physical exertion. He does not appear to be in direct relationship with episodes of atrial fibrillation. I recommended that she have a followup nuclear stress test. She is unable to walk on the treadmill and unlikely to achieve desired target heart rates because of her bradyarrhythmia. Recommend a [employer].

## 2013-04-24 LAB — MDC_IDC_ENUM_SESS_TYPE_INCLINIC
Battery Impedance: 134 Ohm
Battery Remaining Longevity: 146 mo
Battery Voltage: 2.79 V
Brady Statistic AP VP Percent: 1 %
Brady Statistic AP VS Percent: 94 %
Brady Statistic AS VP Percent: 0 %
Brady Statistic AS VS Percent: 5 %
Date Time Interrogation Session: 20150313121946
Lead Channel Impedance Value: 397 Ohm
Lead Channel Impedance Value: 536 Ohm
Lead Channel Pacing Threshold Amplitude: 0.5 V
Lead Channel Pacing Threshold Amplitude: 0.5 V
Lead Channel Pacing Threshold Pulse Width: 0.4 ms
Lead Channel Pacing Threshold Pulse Width: 0.4 ms
Lead Channel Sensing Intrinsic Amplitude: 15.67 mV
Lead Channel Sensing Intrinsic Amplitude: 2.8 mV
Lead Channel Setting Pacing Amplitude: 2 V
Lead Channel Setting Pacing Amplitude: 2.5 V
Lead Channel Setting Pacing Pulse Width: 0.4 ms
Lead Channel Setting Sensing Sensitivity: 5.6 mV

## 2013-05-01 ENCOUNTER — Encounter: Payer: Self-pay | Admitting: *Deleted

## 2013-05-04 ENCOUNTER — Ambulatory Visit (HOSPITAL_COMMUNITY)
Admission: RE | Admit: 2013-05-04 | Discharge: 2013-05-04 | Disposition: A | Discharge status: Home / Self Care | Payer: Medicare HMO | Source: Ambulatory Visit | Attending: Cardiovascular Disease | Admitting: Cardiovascular Disease

## 2013-05-04 DIAGNOSIS — R0989 Other specified symptoms and signs involving the circulatory and respiratory systems: Secondary | ICD-10-CM | POA: Insufficient documentation

## 2013-05-04 DIAGNOSIS — R0609 Other forms of dyspnea: Secondary | ICD-10-CM | POA: Insufficient documentation

## 2013-05-04 DIAGNOSIS — R079 Chest pain, unspecified: Secondary | ICD-10-CM | POA: Insufficient documentation

## 2013-05-04 DIAGNOSIS — R0789 Other chest pain: Secondary | ICD-10-CM

## 2013-05-04 MED ORDER — TECHNETIUM TC 99M SESTAMIBI GENERIC - CARDIOLITE
30.4000 | Freq: Once | INTRAVENOUS | Status: AC | PRN
  Administered 2013-05-04: 30 via INTRAVENOUS

## 2013-05-04 MED ORDER — AMINOPHYLLINE 25 MG/ML IV SOLN
75.0000 mg | Freq: Once | INTRAVENOUS | Status: AC
  Administered 2013-05-04: 75 mg via INTRAVENOUS

## 2013-05-04 MED ORDER — REGADENOSON 0.4 MG/5ML IV SOLN
0.4000 mg | Freq: Once | INTRAVENOUS | Status: AC
  Administered 2013-05-04: 0.4 mg via INTRAVENOUS

## 2013-05-04 MED ORDER — TECHNETIUM TC 99M SESTAMIBI GENERIC - CARDIOLITE
10.2000 | Freq: Once | INTRAVENOUS | Status: AC | PRN
  Administered 2013-05-04: 10 via INTRAVENOUS

## 2013-05-04 NOTE — Procedures (Addendum)
[name] [name] [address] [place] [address] [phone]  Cardiology Nuclear Med Study  [name] [name] is [name] 73 y.o. female     MRN : [id number]     DOB: 04/28/1940  Procedure Date: 05/04/2013  Nuclear Med Background Indication for Stress Test:  Evaluation for Ischemia History:  MVP and CAD-(cath in 2001--non-obstructive);PACER;AFIB;MV regurg;sinus node dysfunction;Last NUC MPI on 02/25/2010-nonischemic;EF80% Cardiac Risk Factors: Family History - CAD, History of Smoking, Hypertension, Lipids and PVD  Symptoms:  Chest Pain, DOE, Fatigue and Light-Headedness   Nuclear Pre-Procedure Caffeine/Decaff Intake:  7:00pm NPO After: 5:00am   IV Site: R Forearm  IV 0.9% NS with Angio Cath:  22g  Chest Size (in):  n/[name] IV Started by: [name] Cecil, RN  Height: 5\' 4"  (1.626 m)  Cup Size: D  BMI:  Body mass index is 26.25 kg/(m^2). Weight:  153 lb (69.4 kg)   Tech Comments:  n/[name]    Nuclear Med Study 1 or 2 day study: 1 day  Stress Test Type:  [place] Provider:  Mihai Croitoru,MD   Resting Radionuclide: Technetium 16m Sestamibi  Resting Radionuclide Dose: 10.2 mCi   Stress Radionuclide:  Technetium 4m Sestamibi  Stress Radionuclide Dose: 30.4 mCi           Stress Protocol Rest HR: 61 Stress HR: 63  Rest BP:138/89 Stress BP: 146/86  Exercise Time (min): n/[name] METS: n/[name]          Dose of Adenosine (mg):  n/[name] Dose of Lexiscan: 0.4 mg  Dose of Atropine (mg): n/[name] Dose of Dobutamine: n/[name] mcg/kg/min (at max HR)  Stress Test Technologist: [name] [name], CCT Nuclear Technologist: [name] [name], CNMT   Rest Procedure:  Myocardial perfusion imaging was performed at rest 45 minutes following the intravenous administration of Technetium 61m Sestamibi. Stress Procedure:  The patient received IV Lexiscan 0.4 mg over 15-seconds.  Technetium 45m Sestamibi injected IV at 30-seconds.  Patient experienced shortness of breath and  chest heaviness and was given 75 mg. of Aminophylline IV at 5 minutes.There were no significant changes with Lexiscan.  Quantitative spect images were obtained after [name] 45 minute delay.  Transient Ischemic Dilatation (Normal <1.22):  0.93 Lung/Heart Ratio (Normal <0.45):  0.22 QGS EDV:  55 ml QGS ESV:  15 ml LV Ejection Fraction: 73%     Rest ECG: NSR with nondiagnostic T inversion in V2  Stress ECG: No significant change from baseline ECG  QPS Raw Data Images:  Normal; no motion artifact; normal heart/lung ratio. Stress Images:  Normal homogeneous uptake in all areas of the myocardium. Increased splanchnic uptake. Rest Images:  Normal homogeneous uptake in all areas of the myocardium. Increased splanchnic uptake. Subtraction (SDS):  Normal  Impression Exercise Capacity:  Lexiscan with no exercise. BP Response:  Normal blood pressure response. Clinical Symptoms:  Mild chest pain/dyspnea. ECG Impression:  No significant ST segment change suggestive of ischemia. Comparison with Prior Nuclear Study: No significant change from previous study  Overall Impression:  Normal stress nuclear study.  LV Wall Motion:  NL LV Function, EF 73%; NL Wall Motion   [name] Medel A, MD  05/04/2013 12:02 PM

## 2013-05-24 ENCOUNTER — Telehealth: Payer: Self-pay | Admitting: Cardiovascular Disease

## 2013-05-24 MED ORDER — RIVAROXABAN 20 MG PO TABS: 20.0000 mg | ORAL_TABLET | Freq: Every day | ORAL | Status: DC

## 2013-05-24 NOTE — Telephone Encounter (Signed)
Would like some samples of Xarelto 20 mg please.. °

## 2013-05-24 NOTE — Telephone Encounter (Signed)
Returned call and pt informed samples left at front desk.  Pt verbalized understanding and agreed w/ plan.    

## 2013-05-29 ENCOUNTER — Telehealth: Payer: Self-pay | Admitting: Cardiovascular Disease

## 2013-05-29 MED ORDER — LOSARTAN POTASSIUM 100 MG PO TABS: 100.0000 mg | ORAL_TABLET | Freq: Every day | ORAL | Status: DC

## 2013-05-29 NOTE — Telephone Encounter (Signed)
Increase losartan to 100mg daily

## 2013-05-29 NOTE — Telephone Encounter (Signed)
Blood pressure is been running high. She says she need to increase one of her medicine,but did not know which one.

## 2013-05-29 NOTE — Telephone Encounter (Signed)
Patient notified to increase Losartan to 100mg  qd-  Will double up on her current RX.  New Rx sent to [facility]. Voiced understanding.

## 2013-05-29 NOTE — Telephone Encounter (Signed)
Patient notified to increase Losartan to 100mg qd-  Will double up on her current RX.  New Rx sent to [facility]. Voiced understanding. 

## 2013-05-29 NOTE — Telephone Encounter (Signed)
BP running high over the last several days.  Yesterday 190/100 w/headache -checked w/manual monitor- and today checked by Hospice nurse (Hospice in for husband) 160/96.  Will have Dr. Loletha Grayer review and advise.  Patient voiced understanding.

## 2013-07-04 ENCOUNTER — Telehealth: Payer: Self-pay | Admitting: Cardiovascular Disease

## 2013-07-04 NOTE — Telephone Encounter (Signed)
Would like samples of Xarelto 20 mg please. °

## 2013-07-05 MED ORDER — RIVAROXABAN 20 MG PO TABS: 20.0000 mg | ORAL_TABLET | Freq: Every day | ORAL | Status: DC

## 2013-07-05 NOTE — Telephone Encounter (Signed)
Spoke with patient told her that her samples would be up at the front desk. Patient voiced understanding [name] 20mg  Lot [id number] Exp 07/2015

## 2013-07-18 ENCOUNTER — Encounter: Payer: Commercial Managed Care - HMO | Admitting: Cardiovascular Disease

## 2013-07-28 ENCOUNTER — Encounter: Payer: Self-pay | Admitting: *Deleted

## 2013-08-02 ENCOUNTER — Telehealth: Payer: Self-pay | Admitting: Cardiovascular Disease

## 2013-08-02 MED ORDER — RIVAROXABAN 20 MG PO TABS: 20.0000 mg | ORAL_TABLET | Freq: Every day | ORAL | Status: DC

## 2013-08-02 NOTE — Telephone Encounter (Signed)
Pt need some samples of Xarelto 20 mg please.

## 2013-08-02 NOTE — Telephone Encounter (Signed)
Patient notified samples are at front desk.

## 2013-08-14 ENCOUNTER — Telehealth (HOSPITAL_COMMUNITY): Payer: Self-pay | Admitting: *Deleted

## 2013-08-25 ENCOUNTER — Telehealth (HOSPITAL_COMMUNITY): Payer: Self-pay | Admitting: *Deleted

## 2013-09-05 ENCOUNTER — Telehealth: Payer: Self-pay | Admitting: Cardiovascular Disease

## 2013-09-05 MED ORDER — RIVAROXABAN 20 MG PO TABS: 20.0000 mg | ORAL_TABLET | Freq: Every day | ORAL | Status: DC

## 2013-09-05 NOTE — Telephone Encounter (Signed)
Pt is here  for a test now,would like some samples of Xarelto 20 mg please. If she can not get them now,she will come back later.

## 2013-09-05 NOTE — Telephone Encounter (Signed)
Spoke with patient and said she would be by tomorrow to pick up her meds when she arrives for her test.

## 2013-09-06 ENCOUNTER — Ambulatory Visit (HOSPITAL_COMMUNITY)
Admission: RE | Admit: 2013-09-06 | Discharge: 2013-09-06 | Disposition: A | Discharge status: Home / Self Care | Payer: Medicare HMO | Source: Ambulatory Visit | Attending: Cardiovascular Disease | Admitting: Cardiovascular Disease

## 2013-09-06 DIAGNOSIS — I701 Atherosclerosis of renal artery: Secondary | ICD-10-CM | POA: Diagnosis present

## 2013-09-06 NOTE — Progress Notes (Signed)
Renal Artery Duplex Completed. °Brianna L Mazza,RVT °

## 2013-10-17 ENCOUNTER — Telehealth: Payer: Self-pay | Admitting: Cardiovascular Disease

## 2013-10-17 NOTE — Telephone Encounter (Signed)
Pt would like some samples of Xarelto 20 mg please. °

## 2013-10-17 NOTE — Telephone Encounter (Signed)
Spoke with patient and informed her that there are no samples avb. At this time. Patient voiced understanding

## 2013-11-03 ENCOUNTER — Telehealth (HOSPITAL_COMMUNITY): Payer: Self-pay | Admitting: *Deleted

## 2013-11-03 NOTE — Telephone Encounter (Signed)
Pt would like to speak with either Dr. Loletha Grayer or his nurse regarding why she needs to have the LEA doppler. She states that she doesn't know whats wrong with her.  Please call

## 2013-11-06 NOTE — Telephone Encounter (Signed)
Order placed after 11/2012 doppler done for a yearly eval.  Explained to patient and she voiced understanding.  She is willing to go ahead a schedule.  Message to [place].

## 2013-11-13 ENCOUNTER — Other Ambulatory Visit (HOSPITAL_COMMUNITY): Payer: Self-pay | Admitting: Cardiovascular Disease

## 2013-11-13 ENCOUNTER — Encounter (HOSPITAL_COMMUNITY): Payer: Self-pay | Admitting: *Deleted

## 2013-11-13 DIAGNOSIS — I739 Peripheral vascular disease, unspecified: Secondary | ICD-10-CM

## 2013-11-24 ENCOUNTER — Inpatient Hospital Stay (HOSPITAL_COMMUNITY): Admission: RE | Admit: 2013-11-24 | Payer: Medicare HMO | Source: Ambulatory Visit

## 2013-11-27 ENCOUNTER — Ambulatory Visit (HOSPITAL_COMMUNITY)
Admission: RE | Admit: 2013-11-27 | Discharge: 2013-11-27 | Disposition: A | Discharge status: Home / Self Care | Payer: Medicare HMO | Source: Ambulatory Visit | Attending: Cardiology | Admitting: Cardiology

## 2013-11-27 DIAGNOSIS — I739 Peripheral vascular disease, unspecified: Secondary | ICD-10-CM

## 2013-11-27 NOTE — Progress Notes (Signed)
Lower Extremity Arterial Duplex Completed. °Brianna L Mazza,RVT °

## 2013-12-07 ENCOUNTER — Telehealth: Payer: Self-pay | Admitting: *Deleted

## 2013-12-07 DIAGNOSIS — I739 Peripheral vascular disease, unspecified: Secondary | ICD-10-CM

## 2013-12-07 NOTE — Telephone Encounter (Signed)
LE doppler results released in MyChart.  Order placed for repeat in 1 year.

## 2013-12-07 NOTE — Telephone Encounter (Signed)
Message copied by Tressa Busman on Thu Dec 07, 2013  8:14 AM ------      Message from: [place], [place]      Created: Tue Dec 05, 2013  9:19 AM       Moderate blockages in both thigh arteries, unchanged from last year. Repeat in 12 months ------

## 2014-01-18 ENCOUNTER — Encounter (HOSPITAL_COMMUNITY): Payer: Self-pay | Admitting: Cardiovascular Disease

## 2014-01-22 ENCOUNTER — Other Ambulatory Visit: Payer: Self-pay | Admitting: Dermatology

## 2014-02-07 ENCOUNTER — Telehealth: Payer: Self-pay | Admitting: Cardiovascular Disease

## 2014-02-07 NOTE — Telephone Encounter (Signed)
Pt called in stating that she has had a rash for a over a year and she thought that she was having a reaction to her blood thinner(which she stopped taking). She says that the rash is painful and looks like "blood sores". She went to a dermatologist and he said that she was having a reaction to either the Amlodipine or the Metoprolol. She would like to know can Dr.C change the meds with out her having to come on for a visit. She is currently in [place] but will be back in [place] on Saturday. Please call  Thanks

## 2014-02-07 NOTE — Telephone Encounter (Signed)
Let's start by stopping the amlodipine for 4-5 weeks. She is already taking an ARB - losartan. Please ask her to monitor BP. Will add hydralazine 25 mg TID if her SBP>160, but for time being, just stop amlodipine

## 2014-02-07 NOTE — Telephone Encounter (Signed)
Spoke with patient, who describes a progressively worsening rash for over a year that began as small bumps on chest and back, progressed into "sores".   She describes itching, "bumps under the skin". These have manifested on chest, back, right leg.  Had f/u with a dermaltologist. He indicated that the culprit might be either the metoprolol or amlodipine.  Pt concerned bc metoprolol dose has been increased several times. The last time was over a year ago, coinciding with onset of symptoms, HOWEVER, she reports that current dose has been most helpful in controlling her arrythmias.  By the time pt had f/u w/ dermatologist, she had already stopped taking her Xarelto (~1 month ago), citing her concern for that being the cause of the rash and also because the sores had been bleeding when scratched; she was concerned about increase in bleeding.  Pt is also hesitant to restart Xarelto, did not elaborate on concerns other than those mentioned.  Spoke w/ Erasmo Downer and she does not think any of the meds would cause this type of reaction. She suggested possibly changing the pt's Amlodipine to an ACE or ARB.  Questions:  Can we trial pt on ACE or ARB in place of the Amlodipine?  Does she need to restart Xarelto?

## 2014-02-19 ENCOUNTER — Telehealth: Payer: Self-pay | Admitting: Cardiovascular Disease

## 2014-02-19 MED ORDER — HYDRALAZINE HCL 25 MG PO TABS
25.0000 mg | ORAL_TABLET | Freq: Three times a day (TID) | ORAL | Status: DC
Start: 2014-02-19 — End: 2014-11-27

## 2014-02-19 NOTE — Telephone Encounter (Signed)
Spoke w/ Dr. Loletha Grayer, he stated he was fine w/ this action.   Called pt, she voiced understanding. She will call back later this week if concerns.

## 2014-02-19 NOTE — Telephone Encounter (Signed)
Pleasec call asap ,Her blood pressure is 200/110.

## 2014-02-19 NOTE — Telephone Encounter (Signed)
Pt reports 200/100 BP this AM. She denies symptoms w/ this. She had taken BP last night and it was 180/100.  Pt had been unable to check BPs the several days prior, she'd been out of town and did not have her BP machine.   She'd been recently d/ced from Amlidipine 5mg  daily per our instructions, and is still taking her Losartan 100mg  daily.  I called in Hydralazine 25mg  TID for pt per instructions on last office note. Instructed patient on how to take this. I asked that she check her BP again this afternoon and update me. She voiced understanding.   She states she has a brother in town who will pick up her prescription.   I told her I would speak w/ Dr. Sallyanne Kuster to see what his recommendations were.

## 2014-03-27 ENCOUNTER — Other Ambulatory Visit (HOSPITAL_COMMUNITY): Payer: Self-pay | Admitting: Cardiovascular Disease

## 2014-03-28 NOTE — Telephone Encounter (Signed)
Rx(s) sent to pharmacy electronically.  

## 2014-04-05 ENCOUNTER — Telehealth: Payer: Self-pay | Admitting: Cardiovascular Disease

## 2014-04-05 NOTE — Telephone Encounter (Signed)
Pt is calling in wanting to speak with Dr. Loletha Grayer about some test that a [place] wants her to have done. She would like to know if Dr.C approves of her having these test. Please f/u with her. She states that she is suppose to have these test done on 3/10.   Thanks

## 2014-04-06 MED ORDER — METOPROLOL SUCCINATE ER 100 MG PO TB24: ORAL_TABLET | ORAL | Status: DC

## 2014-04-06 NOTE — Telephone Encounter (Signed)
Probably not. We have performed more detailed tests on her arteries for AAA and PADalready and her pacemaker is always monitoring for atrial fibrillation. The osteoporosis screen is the only investigation that we have not provided for her

## 2014-04-06 NOTE — Telephone Encounter (Signed)
Called patient to notify of Dr. Lurline Del recommendations. Pt requested a refill for one of her medications be sent to a local pharmacy because she was out and uses mail order pharmacy. 1 time prescript sent to preferred local pharmacy.

## 2014-04-06 NOTE — Telephone Encounter (Signed)
Pt stated that [facility] is coming to her town and they are offering 5 different test for $149 and would like to know if she should participate.  The 5 Tests are:  Carotid Artery Screening Heart Rhythm screen for A. Fib AAA screening PAD screening Osteoporosis Risk Assessment  Pt wants to know your professional opinion on if this is worth it and needed.

## 2014-06-28 ENCOUNTER — Other Ambulatory Visit (HOSPITAL_COMMUNITY): Payer: Self-pay | Admitting: Cardiovascular Disease

## 2014-06-28 NOTE — Telephone Encounter (Signed)
Rx(s) sent to pharmacy electronically.  

## 2014-08-06 ENCOUNTER — Ambulatory Visit (INDEPENDENT_AMBULATORY_CARE_PROVIDER_SITE_OTHER): Payer: Commercial Managed Care - HMO | Admitting: Family Medicine

## 2014-08-06 VITALS — BP 110/66 | HR 84 | Temp 99.7°F | Resp 18 | Ht 65.5 in | Wt 146.0 lb

## 2014-08-06 DIAGNOSIS — J209 Acute bronchitis, unspecified: Secondary | ICD-10-CM

## 2014-08-06 MED ORDER — AZITHROMYCIN 250 MG PO TABS: ORAL_TABLET | ORAL | Status: DC

## 2014-08-06 MED ORDER — CYANOCOBALAMIN 500 MCG PO TABS: 500.0000 ug | ORAL_TABLET | Freq: Every day | ORAL | Status: DC

## 2014-08-06 NOTE — Patient Instructions (Signed)
Stop the Vitamin E   Acute Bronchitis Bronchitis is inflammation of the airways that extend from the windpipe into the lungs (bronchi). The inflammation often causes mucus to develop. This leads to a cough, which is the most common symptom of bronchitis.  In acute bronchitis, the condition usually develops suddenly and goes away over time, usually in a couple weeks. Smoking, allergies, and asthma can make bronchitis worse. Repeated episodes of bronchitis may cause further lung problems.  CAUSES Acute bronchitis is most often caused by the same virus that causes a cold. The virus can spread from person to person (contagious) through coughing, sneezing, and touching contaminated objects. SIGNS AND SYMPTOMS   Cough.   Fever.   Coughing up mucus.   Body aches.   Chest congestion.   Chills.   Shortness of breath.   Sore throat.  DIAGNOSIS  Acute bronchitis is usually diagnosed through a physical exam. Your health care provider will also ask you questions about your medical history. Tests, such as chest X-rays, are sometimes done to rule out other conditions.  TREATMENT  Acute bronchitis usually goes away in a couple weeks. Oftentimes, no medical treatment is necessary. Medicines are sometimes given for relief of fever or cough. Antibiotic medicines are usually not needed but may be prescribed in certain situations. In some cases, an inhaler may be recommended to help reduce shortness of breath and control the cough. A cool mist vaporizer may also be used to help thin bronchial secretions and make it easier to clear the chest.  HOME CARE INSTRUCTIONS  Get plenty of rest.   Drink enough fluids to keep your urine clear or pale yellow (unless you have a medical condition that requires fluid restriction). Increasing fluids may help thin your respiratory secretions (sputum) and reduce chest congestion, and it will prevent dehydration.   Take medicines only as directed by your health  care provider.  If you were prescribed an antibiotic medicine, finish it all even if you start to feel better.  Avoid smoking and secondhand smoke. Exposure to cigarette smoke or irritating chemicals will make bronchitis worse. If you are a smoker, consider using nicotine gum or skin patches to help control withdrawal symptoms. Quitting smoking will help your lungs heal faster.   Reduce the chances of another bout of acute bronchitis by washing your hands frequently, avoiding people with cold symptoms, and trying not to touch your hands to your mouth, nose, or eyes.   Keep all follow-up visits as directed by your health care provider.  SEEK MEDICAL CARE IF: Your symptoms do not improve after 1 week of treatment.  SEEK IMMEDIATE MEDICAL CARE IF:  You develop an increased fever or chills.   You have chest pain.   You have severe shortness of breath.  You have bloody sputum.   You develop dehydration.  You faint or repeatedly feel like you are going to pass out.  You develop repeated vomiting.  You develop a severe headache. MAKE SURE YOU:   Understand these instructions.  Will watch your condition.  Will get help right away if you are not doing well or get worse. Document Released: 03/05/2004 Document Revised: 06/12/2013 Document Reviewed: 07/19/2012 [facility] Patient Information 2015 [place], [place]. This information is not intended to replace advice given to you by your health care provider. Make sure you discuss any questions you have with your health care provider.

## 2014-08-06 NOTE — Progress Notes (Signed)
38 woman with 4 days of cough preceded by months of fatigue.  She was cleaning the basement last Thursday, the day the cough began.  Nonproductive cough.  No fever, but some chills last night and yesterday..  She has a pacemaker and stents in both legs.  The pacemaker was inserted a year ago.  She has yet to have a followup with Dr. Orene Desanctis.   A little bit more short of breath in past month.  No chest pain.  [employer] underwriter.  Husband of 66 years died this past year.  Has had both pneumonia vaccines.  PCP is Dr. Sabra Heck, and patient knows she needs to go back for follow up  Objective: NAD BP 110/66 mmHg  Pulse 84  Temp(Src) 99.7 F (37.6 C) (Oral)  Resp 18  Ht 5' 5.5" (1.664 m)  Wt 146 lb (66.225 kg)  BMI 23.92 kg/m2  SpO2 96%  HEENT: neg Chest:  Expiratory wheezes Heart:  Regular, 60 bpm, no murmur.  Assessment:      ICD-9-CM ICD-10-CM   1. Acute bronchitis, unspecified organism 466.0 J20.9 azithromycin (ZITHROMAX) 250 MG tablet   Follow up with PCP and Dr. Orene Desanctis  Return if cough persists.  Signed, Robyn Haber, MD

## 2014-08-08 ENCOUNTER — Other Ambulatory Visit: Payer: Self-pay

## 2014-08-08 MED ORDER — CYANOCOBALAMIN 500 MCG PO TABS: 500.0000 ug | ORAL_TABLET | Freq: Every day | ORAL | Status: DC

## 2014-08-22 ENCOUNTER — Other Ambulatory Visit: Payer: Self-pay | Admitting: Cardiovascular Disease

## 2014-08-22 ENCOUNTER — Telehealth (HOSPITAL_COMMUNITY): Payer: Self-pay | Admitting: *Deleted

## 2014-08-22 DIAGNOSIS — I701 Atherosclerosis of renal artery: Secondary | ICD-10-CM

## 2014-08-22 NOTE — Telephone Encounter (Signed)
Spoke to patient, explained rationale for yearly testing, she voiced understanding and will keep renal US appt.  Pt inquired on procedure prep. Advised NPO after midnight night before per technologist recommendations. She verbalized understanding.

## 2014-08-22 NOTE — Telephone Encounter (Signed)
Left pt msg to call back  

## 2014-08-22 NOTE — Telephone Encounter (Signed)
I called pt to schedule her renal doppler and she is concerned as to why she still needs to have this test done. Please call

## 2014-08-24 ENCOUNTER — Telehealth: Payer: Self-pay | Admitting: *Deleted

## 2014-08-24 NOTE — Telephone Encounter (Signed)
Spoke to patient about overdue ppm follow up. Patient stated that she has never been able to send in a remote transmission. Appt made w/the device clinic on 7/18 @ 0900. Patient aware that appt is at [facility]. Address and telephone number given.

## 2014-08-27 ENCOUNTER — Ambulatory Visit (INDEPENDENT_AMBULATORY_CARE_PROVIDER_SITE_OTHER): Payer: Commercial Managed Care - HMO | Admitting: *Deleted

## 2014-08-27 DIAGNOSIS — I495 Sick sinus syndrome: Secondary | ICD-10-CM

## 2014-08-27 DIAGNOSIS — Z950 Presence of cardiac pacemaker: Secondary | ICD-10-CM | POA: Diagnosis not present

## 2014-08-27 DIAGNOSIS — I480 Paroxysmal atrial fibrillation: Secondary | ICD-10-CM | POA: Diagnosis not present

## 2014-08-27 LAB — CUP PACEART INCLINIC DEVICE CHECK
Battery Impedance: 182 Ohm
Battery Remaining Longevity: 120 mo
Battery Voltage: 2.79 V
Brady Statistic AP VP Percent: 1 %
Brady Statistic AP VS Percent: 95 %
Brady Statistic AS VP Percent: 0 %
Brady Statistic AS VS Percent: 4 %
Date Time Interrogation Session: 20160718100021
Lead Channel Impedance Value: 403 Ohm
Lead Channel Impedance Value: 501 Ohm
Lead Channel Pacing Threshold Amplitude: 0.5 V
Lead Channel Pacing Threshold Amplitude: 0.75 V
Lead Channel Pacing Threshold Pulse Width: 0.4 ms
Lead Channel Pacing Threshold Pulse Width: 0.4 ms
Lead Channel Sensing Intrinsic Amplitude: 15.67 mV
Lead Channel Sensing Intrinsic Amplitude: 2 mV
Lead Channel Setting Pacing Amplitude: 2 V
Lead Channel Setting Pacing Amplitude: 2.5 V
Lead Channel Setting Pacing Pulse Width: 0.4 ms
Lead Channel Setting Sensing Sensitivity: 5.6 mV

## 2014-08-27 NOTE — Progress Notes (Signed)
Pacemaker check in clinic. Normal device function. Thresholds, sensing, impedances consistent with previous measurements. Device programmed to maximize longevity. 333 mode switches, 0.4% + ASA only---pt took herself off Xarelto, pt highly encouraged to recommence xarelto, pt aware she's at risk of stroke. 14 high ventricular rates---longest 18 beats; multiple EGMs show RVR. Device programmed at appropriate safety margins. Histogram distribution appropriate for patient activity level. Device programmed to optimize intrinsic conduction. Estimated longevity 81yrs. ROV w/ [facility] 11/26/14, pt aware appt at [facility].

## 2014-09-07 ENCOUNTER — Telehealth: Payer: Self-pay | Admitting: Cardiovascular Disease

## 2014-09-07 NOTE — Telephone Encounter (Signed)
Medication samples have been provided to the patient.  Drug name: xarelto 63  Qty: 30  LOT: [id number]  Exp.Date: 07/2016  Samples left at front desk for patient pick-up. Patient notified. Patient has appointment 11/27/14 with Dr. Loletha Grayer Patient advised to give 7 day notice if she is low on samples as we may not have them.  Sheral Apley M 1:16 PM 09/07/2014

## 2014-09-07 NOTE — Telephone Encounter (Signed)
Patient calling the office for samples of medication:   1.  What medication and dosage are you requesting samples for? Xarelto  2.  Are you currently out of this medication? 1 left  3. Are you requesting samples to get you through until a mail order prescription arrives?no

## 2014-09-21 ENCOUNTER — Ambulatory Visit (HOSPITAL_COMMUNITY)
Admission: RE | Admit: 2014-09-21 | Discharge: 2014-09-21 | Disposition: A | Discharge status: Home / Self Care | Payer: Commercial Managed Care - HMO | Source: Ambulatory Visit | Attending: Cardiovascular Disease | Admitting: Cardiovascular Disease

## 2014-09-21 DIAGNOSIS — I701 Atherosclerosis of renal artery: Secondary | ICD-10-CM | POA: Insufficient documentation

## 2014-09-25 ENCOUNTER — Encounter: Payer: Self-pay | Admitting: Cardiovascular Disease

## 2014-09-30 ENCOUNTER — Other Ambulatory Visit: Payer: Self-pay | Admitting: Cardiovascular Disease

## 2014-10-01 NOTE — Telephone Encounter (Signed)
Rx(s) sent to pharmacy electronically.  

## 2014-10-03 ENCOUNTER — Telehealth: Payer: Self-pay | Admitting: Cardiovascular Disease

## 2014-10-03 MED ORDER — RIVAROXABAN 20 MG PO TABS: 20.0000 mg | ORAL_TABLET | Freq: Every day | ORAL | Status: DC

## 2014-10-03 NOTE — Telephone Encounter (Signed)
Informed patient no samples available at present - recommended checking back next week. Pt states 6 pills left, she has been supplied w/ samples historically. I offered to send refill - she stated preferred pharmacy. Requested 15 day Rx sent to pharmacy, she will call back again for samples.

## 2014-10-03 NOTE — Telephone Encounter (Signed)
Patient calling the office for samples of medication:   1.  What medication and dosage are you requesting samples for? Xarelto  2.  Are you currently out of this medication?6 pills left  3. Are you requesting samples to get you through until a mail order prescription arrives?no

## 2014-10-11 ENCOUNTER — Telehealth: Payer: Self-pay | Admitting: Cardiovascular Disease

## 2014-10-11 NOTE — Telephone Encounter (Signed)
Pt wants to know if it all right for her to start exercising with a personal trainer at the [employer]. She is there now,needs an answer asap please.

## 2014-10-11 NOTE — Telephone Encounter (Signed)
  Pt wants to know if it all right for her to start exercising with a personal trainer at the [employer].         Last OV with Dr. Sallyanne Kuster 05/2013  Last OV PCP 07/2014  Patient would like to make an appointment for office visit.  Routing to scheduling

## 2014-10-11 NOTE — Telephone Encounter (Signed)
The October OV for patient at [facility] states pacer check.  Patient said she already had an pacer check at [facility] in July.  She looks to be due for a regular office appointment,  Need clarification

## 2014-10-11 NOTE — Telephone Encounter (Signed)
LVMTCB

## 2014-10-11 NOTE — Telephone Encounter (Signed)
Agree, OK for moderate exercise

## 2014-10-11 NOTE — Telephone Encounter (Signed)
Low intensity exercise would be safe, keeping heart rate below 110.

## 2014-10-12 NOTE — Telephone Encounter (Signed)
Will do a pacer check at [facility] in October (3 month interrogation).

## 2014-11-09 ENCOUNTER — Other Ambulatory Visit: Payer: Self-pay | Admitting: Cardiovascular Disease

## 2014-11-09 MED ORDER — RIVAROXABAN 20 MG PO TABS: 20.0000 mg | ORAL_TABLET | Freq: Every day | ORAL | Status: DC

## 2014-11-09 NOTE — Telephone Encounter (Signed)
Samples placed at the front desk for pick up.  Patient notified and voiced understanding.

## 2014-11-09 NOTE — Telephone Encounter (Signed)
Patient calling the office for samples of medication:   1.  What medication and dosage are you requesting samples for?Xarelto 2.  Are you currently out of this medication? no  3. Are you requesting samples to get you through until a mail order prescription arrives?no

## 2014-11-27 ENCOUNTER — Encounter: Payer: Self-pay | Admitting: Cardiovascular Disease

## 2014-11-27 ENCOUNTER — Ambulatory Visit (INDEPENDENT_AMBULATORY_CARE_PROVIDER_SITE_OTHER): Payer: Commercial Managed Care - HMO | Admitting: Cardiovascular Disease

## 2014-11-27 VITALS — BP 120/62 | HR 61 | Resp 16 | Ht 64.0 in | Wt 144.0 lb

## 2014-11-27 DIAGNOSIS — R001 Bradycardia, unspecified: Secondary | ICD-10-CM | POA: Diagnosis not present

## 2014-11-27 DIAGNOSIS — I495 Sick sinus syndrome: Secondary | ICD-10-CM

## 2014-11-27 DIAGNOSIS — I480 Paroxysmal atrial fibrillation: Secondary | ICD-10-CM

## 2014-11-27 DIAGNOSIS — I739 Peripheral vascular disease, unspecified: Secondary | ICD-10-CM

## 2014-11-27 DIAGNOSIS — Z950 Presence of cardiac pacemaker: Secondary | ICD-10-CM

## 2014-11-27 NOTE — Patient Instructions (Signed)
Your physician wants you to follow-up in: 12 Months. You will receive a reminder letter in the mail two months in advance. If you don't receive a letter, please call our office to schedule the follow-up appointment.  Remote monitoring is used to monitor your Pacemaker of ICD from home. This monitoring reduces the number of office visits required to check your device to one time per year. It allows [place] to keep an eye on the functioning of your device to ensure it is working properly. You are scheduled for a device check from home on 02/26/2015. You may send your transmission at any time that day. If you have a wireless device, the transmission will be sent automatically. After your physician reviews your transmission, you will receive a postcard with your next transmission date.

## 2014-11-27 NOTE — Progress Notes (Signed)
Patient ID: [name], female   DOB: 05-20-1940, 74 y.o.   MRN: [id number]     Cardiology Office Note   Date:  11/27/2014   ID:  [name], DOB 08-27-1940, MRN [id number]  PCP:  [name], MD  Cardiologist:   Sanda Klein, MD   Chief Complaint  Patient presents with  . PHYS PACER CHECK  . Edema    ANKLES      History of Present Illness: [name] is a 74 y.o. female who presents for a history of recurrent paroxysmal atrial fibrillation and sinus node dysfunction who received a dual-chamber permanent pacemaker in December of 2013 (Medtronic). She does not have known structural heart disease but did undergo coronary angiography in 2001 for new ECG abnormalities. She was found to have 20-30% lesions in the proximal LAD and mitral valve prolapse. She had a normal nuclear stress test in March 2015.  Interrogation of her pacemaker today shows normal function. She has had infrequent atrial fibrillation with an overall burden of only 0.3%. Ventricular rate control during atrial fibrillation is adequate. Her heart rate histograms are favorable suggesting that we are doing better with chronotropic incompetence treatment. There is 98.4% atrial pacing and only 0.7 ventricular pacing. Device longevity is estimated at 9.5 years  She denies any problems with exertional angina or dyspnea and has had minimal ankle swelling intermittently. She is now exercising at the gym and feels that she has more energy and is successfully losing weight. She weighs 15 pounds less than at her last appointment in the spring. Her overall mood seems to be substantially improved and I think she is following stenting to recover from the grief following the demise of her husband.  Past Medical History  Diagnosis Date  . GLUCOSE INTOLERANCE 12/31/2009  . HYPERLIPIDEMIA 12/31/2009  . ANXIETY 12/31/2009  . DEPRESSION 12/31/2009  . MITRAL REGURGITATION 12/31/2009  . HYPERTENSION 12/31/2009  . CORONARY ARTERY  DISEASE 12/31/2009  . MITRAL VALVE PROLAPSE 12/31/2009  . MENOPAUSE, EARLY 12/31/2009  . Unspecified urinary incontinence 12/31/2009  . ABDOMINAL PAIN, LOWER 12/31/2009  . DIVERTICULITIS, HX OF 12/31/2009  . Pacemaker 01/21/2012    dual chamber  . Tachycardia-bradycardia syndrome ([place])     [name] 01/21/2012  . PAD (peripheral artery disease) ([place])   . Heart murmur     "used to say I did" (01/21/2012)  . OSTEOARTHRITIS, HAND 12/31/2009  . [place] DISEASE, CERVICAL 12/31/2009  . [place] DISEASE, LUMBAR 12/31/2009  . Symptomatic sinus bradycardia 01/22/2012  . PAF (paroxysmal atrial fibrillation) ([place]) 01/22/2012  . Sinus node dysfunction ([place]) 01/22/2012  . S/P placement of cardiac pacemaker, medtronic adapta 01/21/12 01/22/2012  . PVD (peripheral vascular disease), hx stents to bil SFAs 02/2010 01/22/2012  . PAD (peripheral artery disease) [facility])     Past Surgical History  Procedure Laterality Date  . Cardiac catheterization  2005    "negative" (01/21/2012)  . Oophorectomy  ~1979  . Partial colectomy  2010  . Carpal tunnel release  2008    "right hand/thumb; carpal tunnel repair; got rid of arthritis" (01/21/2012)  . Insert / replace / remove pacemaker  01/21/2012    initial placement  . Vaginal hysterectomy  1975  . Posterior cervical laminectomy  1985  . Peripheral arterial stent graft  2012; 2012    "LLE; RLE" (01/21/2012)  . Facelift, lower 2/3  1995    "mini" (01/21/2012)  . Augmentation mammaplasty  1983  . Pacemaker insertion  01/21/2012    Medtronic Adapta,  model# [id number], serial# [id number]  . Lower arterial examination  10/28/2011    R. SFA stent mild-moderate mixed density plaque with elevated velocities consistent with 50% diameter reduction. L. SFA stent moderate mixed denisty plaque at mid to distal level consistent with 50-69% diameter reduction.  . Cardiac catheterization  09/17/1999    Mild CAD involving proximal portion of LAD. Mitral valve prolapse w/ mitral regurg.  No evidence of renal artery stenosis. Recommended readjustment of antihypertensive medications.  . Persantine myoview stress test  02/25/2010    No scintigraphic evidence of inducible myocardial ischemia. No persantine EKG changes. Non-diagnositc for ishcemia. Low-risk, normal scan.  Marland Kitchen Permanent pacemaker insertion N/A 01/21/2012    Procedure: PERMANENT PACEMAKER INSERTION;  Surgeon: Sanda Klein, MD;  Location: [place] CATH LAB;  Service: Cardiovascular;  Laterality: N/A;     Current Outpatient Prescriptions  Medication Sig Dispense Refill  . amLODipine (NORVASC) 5 MG tablet Take 1 tablet (5 mg total) by mouth daily. KEEP UPCOMING APPOINTMENT 90 tablet 1  . aspirin 81 MG EC tablet Take 81 mg by mouth daily.     . cholecalciferol (VITAMIN D) 1000 UNITS tablet Take 1,000 Units by mouth daily.    . citalopram (CELEXA) 20 MG tablet Take 1 tablet (20 mg total) by mouth daily. 90 tablet 0  . cyanocobalamin 500 MCG tablet Take 1 tablet (500 mcg total) by mouth daily. 90 tablet 3  . Fish Oil OIL by Does not apply route.    Marland Kitchen losartan (COZAAR) 50 MG tablet Take 1 tablet (50 mg total) by mouth daily. KEEP UPCOMING APPOINTMENT 90 tablet 1  . metoprolol succinate (TOPROL-XL) 100 MG 24 hr tablet Take 1 tablet (100 mg total) by mouth daily. KEEP UPCOMING APPOINTMENT 90 tablet 1  . rivaroxaban (XARELTO) 20 MG TABS tablet Take 1 tablet (20 mg total) by mouth daily with supper. 20 tablet 0   No current facility-administered medications for this visit.    Allergies:   Clonidine derivatives; Plavix; and Exforge    Social History:  The patient  reports that she has quit smoking. Her smoking use included Cigarettes. She has a 7.5 pack-year smoking history. She has never used smokeless tobacco. She reports that she drinks about 9.0 oz of alcohol per week. She reports that she does not use illicit drugs.   Family History:  The patient's family history includes Heart disease in her brother and mother; Hypertension  in her brother, brother, and mother; Stroke in her brother, father, and mother.    ROS:  Please see the history of present illness.    Otherwise, review of systems positive for none.   All other systems are reviewed and negative.    PHYSICAL EXAM: VS:  BP 120/62 mmHg  Pulse 61  Resp 16  Ht 5\' 4"  (1.626 m)  Wt 144 lb (65.318 kg)  BMI 24.71 kg/m2 , BMI Body mass index is 24.71 kg/(m^2).  General: Alert, oriented x3, no distress Head: no evidence of trauma, PERRL, EOMI, no exophtalmos or lid lag, no myxedema, no xanthelasma; normal ears, nose and oropharynx Neck: normal jugular venous pulsations and no hepatojugular reflux; brisk carotid pulses without delay and no carotid bruits Chest: clear to auscultation, no signs of consolidation by percussion or palpation, normal fremitus, symmetrical and full respiratory excursions, healthy left subclavian pacemaker site Cardiovascular: normal position and quality of the apical impulse, regular rhythm, normal first and second heart sounds, no murmurs, rubs or gallops Abdomen: no tenderness or distention, no masses by palpation,  no abnormal pulsatility or arterial bruits, normal bowel sounds, no hepatosplenomegaly Extremities: no clubbing, cyanosis or edema; 2+ radial, ulnar and brachial pulses bilaterally; 2+ right femoral, posterior tibial and dorsalis pedis pulses; 2+ left femoral, posterior tibial and dorsalis pedis pulses; no subclavian or femoral bruits Neurological: grossly nonfocal Psych: euthymic mood, full affect   EKG:  EKG is ordered today. The ekg ordered today demonstrates atrial paced, ventricular sensed, subtle T-wave inversion V1-V3, similar to previous tracings   Wt Readings from Last 3 Encounters:  11/27/14 144 lb (65.318 kg)  08/06/14 146 lb (66.225 kg)  05/04/13 153 lb (69.4 kg)        ASSESSMENT AND PLAN:  CORONARY ARTERY DISEASE, nonobstructive at cath 2001, negative Nuc stress 2015 Despite being more physically  active than in the past, she is asymptomatic.  S/P placement of cardiac pacemaker, medtronic adapta 01/21/12 Normal pacemaker function. Improved exercise tolerance may be partly related to increased rate response sensor settings, but also due to improved mood as more time passes from her husband's demise.   PAF (paroxysmal atrial fibrillation) The overall burden of it for fibrillation is quite low at only 0.3%. Ventricular rate control is fair if not optimal. Continue anticoagulation with Xarelto. CHADSVasc score is at least 5 (age, gender, hypertension, vascular disease).  PVD (peripheral vascular disease), hx stents to bil SFAs 02/2010 No current symptoms of intermittent claudication.   HYPERLIPIDEMIA She remains reluctant to start statin therapy. Review lipids again after she reaches her target weight:, Since she is making such good progress with exercise and weight loss.    Current medicines are reviewed at length with the patient today.  The patient does not have concerns regarding medicines.  The following changes have been made:  no change  Labs/ tests ordered today include:  Orders Placed This Encounter  Procedures  . EKG 12-Lead    Patient Instructions  Your physician wants you to follow-up in: 12 Months. You will receive a reminder letter in the mail two months in advance. If you don't receive a letter, please call our office to schedule the follow-up appointment.  Remote monitoring is used to monitor your Pacemaker of ICD from home. This monitoring reduces the number of office visits required to check your device to one time per year. It allows [place] to keep an eye on the functioning of your device to ensure it is working properly. You are scheduled for a device check from home on 02/26/2015. You may send your transmission at any time that day. If you have a wireless device, the transmission will be sent automatically. After your physician reviews your transmission, you will  receive a postcard with your next transmission date.      Mikael Spray, MD  11/27/2014 10:50 AM    Sanda Klein, MD, [facility] [phone] office [phone] pager

## 2014-11-29 LAB — CUP PACEART INCLINIC DEVICE CHECK
Battery Impedance: 229 Ohm
Battery Remaining Longevity: 113 mo
Battery Voltage: 2.79 V
Brady Statistic AP VP Percent: 1 %
Brady Statistic AP VS Percent: 98 %
Brady Statistic AS VP Percent: 0 %
Brady Statistic AS VS Percent: 2 %
Date Time Interrogation Session: 20161018133214
Implantable Lead Implant Date: 20131212
Implantable Lead Implant Date: 20131212
Implantable Lead Location: 753859
Implantable Lead Location: 753860
Implantable Lead Model: 5076
Implantable Lead Model: 5076
Lead Channel Impedance Value: 431 Ohm
Lead Channel Impedance Value: 474 Ohm
Lead Channel Pacing Threshold Amplitude: 0.375 V
Lead Channel Pacing Threshold Amplitude: 0.5 V
Lead Channel Pacing Threshold Amplitude: 0.625 V
Lead Channel Pacing Threshold Amplitude: 0.75 V
Lead Channel Pacing Threshold Pulse Width: 0.4 ms
Lead Channel Pacing Threshold Pulse Width: 0.4 ms
Lead Channel Pacing Threshold Pulse Width: 0.4 ms
Lead Channel Pacing Threshold Pulse Width: 0.4 ms
Lead Channel Sensing Intrinsic Amplitude: 11.2 mV
Lead Channel Sensing Intrinsic Amplitude: 2.8 mV
Lead Channel Setting Pacing Amplitude: 2 V
Lead Channel Setting Pacing Amplitude: 2.5 V
Lead Channel Setting Pacing Pulse Width: 0.4 ms
Lead Channel Setting Sensing Sensitivity: 5.6 mV

## 2014-12-10 ENCOUNTER — Other Ambulatory Visit: Payer: Self-pay | Admitting: Cardiovascular Disease

## 2014-12-10 ENCOUNTER — Other Ambulatory Visit: Payer: Self-pay | Admitting: Family Medicine

## 2014-12-10 DIAGNOSIS — I739 Peripheral vascular disease, unspecified: Secondary | ICD-10-CM

## 2014-12-12 ENCOUNTER — Encounter: Payer: Self-pay | Admitting: Cardiovascular Disease

## 2014-12-14 ENCOUNTER — Ambulatory Visit (HOSPITAL_COMMUNITY)
Admission: RE | Admit: 2014-12-14 | Discharge: 2014-12-14 | Disposition: A | Discharge status: Home / Self Care | Payer: Commercial Managed Care - HMO | Source: Ambulatory Visit | Attending: Cardiovascular Disease | Admitting: Cardiovascular Disease

## 2014-12-14 DIAGNOSIS — I739 Peripheral vascular disease, unspecified: Secondary | ICD-10-CM | POA: Diagnosis not present

## 2014-12-17 ENCOUNTER — Telehealth: Payer: Self-pay | Admitting: *Deleted

## 2014-12-17 ENCOUNTER — Telehealth: Payer: Self-pay | Admitting: Cardiovascular Disease

## 2014-12-17 DIAGNOSIS — I739 Peripheral vascular disease, unspecified: Secondary | ICD-10-CM

## 2014-12-17 NOTE — Telephone Encounter (Signed)
Medication samples have been provided to the patient.  Drug name: xarelto 20mg   Qty: 25 (5 bottles)  LOT: [id number]  Exp.Date: 01/19  Samples left at front desk for patient pick-up. Patient notified.  Sheral Apley M 2:12 PM 12/17/2014

## 2014-12-17 NOTE — Telephone Encounter (Signed)
Doppler results called to patient.  Voiced understanding.  Copy sent to PCP.

## 2014-12-17 NOTE — Telephone Encounter (Signed)
-----   Message from Sanda Klein, MD sent at 12/17/2014  8:21 AM EST ----- Unchanged moderate blockage in left SFA. Stents patent. Repeat in a year. Copy PCP please

## 2014-12-17 NOTE — Telephone Encounter (Signed)
Patient calling the office for samples of medication:   1.  What medication and dosage are you requesting samples for? Xarelto 20mg    2.  Are you currently out of this medication? Yes she is

## 2014-12-28 ENCOUNTER — Encounter: Payer: Self-pay | Admitting: Cardiovascular Disease

## 2014-12-28 ENCOUNTER — Ambulatory Visit (INDEPENDENT_AMBULATORY_CARE_PROVIDER_SITE_OTHER): Payer: Commercial Managed Care - HMO | Admitting: Cardiovascular Disease

## 2014-12-28 ENCOUNTER — Encounter: Payer: Self-pay | Admitting: Interventional Cardiology

## 2014-12-28 VITALS — BP 122/70 | HR 82 | Ht 64.5 in | Wt 143.9 lb

## 2014-12-28 DIAGNOSIS — Z79899 Other long term (current) drug therapy: Secondary | ICD-10-CM | POA: Diagnosis not present

## 2014-12-28 DIAGNOSIS — R001 Bradycardia, unspecified: Secondary | ICD-10-CM

## 2014-12-28 DIAGNOSIS — I480 Paroxysmal atrial fibrillation: Secondary | ICD-10-CM | POA: Diagnosis not present

## 2014-12-28 DIAGNOSIS — I200 Unstable angina: Secondary | ICD-10-CM

## 2014-12-28 DIAGNOSIS — R0789 Other chest pain: Secondary | ICD-10-CM | POA: Diagnosis not present

## 2014-12-28 DIAGNOSIS — I25810 Atherosclerosis of coronary artery bypass graft(s) without angina pectoris: Secondary | ICD-10-CM

## 2014-12-28 DIAGNOSIS — I495 Sick sinus syndrome: Secondary | ICD-10-CM | POA: Diagnosis not present

## 2014-12-28 LAB — CBC
HCT: 42.8 % (ref 36.0–46.0)
Hemoglobin: 14.6 g/dL (ref 12.0–15.0)
MCH: 31.7 pg (ref 26.0–34.0)
MCHC: 34.1 g/dL (ref 30.0–36.0)
MCV: 93 fL (ref 78.0–100.0)
MPV: 10.9 fL (ref 8.6–12.4)
Platelets: 173 10*3/uL (ref 150–400)
RBC: 4.6 MIL/uL (ref 3.87–5.11)
RDW: 12.9 % (ref 11.5–15.5)
WBC: 7.9 10*3/uL (ref 4.0–10.5)

## 2014-12-28 NOTE — Patient Instructions (Addendum)
Your physician recommends that you return for lab work in: [place] has requested that you have a cardiac catheterization  FIRST [place]. YOU WILL NEED TO HOLD YOUR XARELTO 84 HOURS PRIOR TO THE CATH. Cardiac catheterization is used to diagnose and/or treat various heart conditions. Doctors may recommend this procedure for a number of different reasons. The most common reason is to evaluate chest pain. Chest pain can be a symptom of coronary artery disease (CAD), and cardiac catheterization can show whether plaque is narrowing or blocking your heart's arteries. This procedure is also used to evaluate the valves, as well as measure the blood flow and oxygen levels in different parts of your heart. For further information please visit [email or web address]. Please follow instruction sheet, as given.

## 2014-12-28 NOTE — Progress Notes (Signed)
Patient ID: [name], female   DOB: 1940/10/04, 74 y.o.   MRN: [id number]     Cardiology Office Note   Date:  12/28/2014   ID:  [name], DOB 1940-07-04, MRN [id number]  PCP:  [name], MD  Cardiologist:   Sanda Klein, MD   Chief Complaint  Patient presents with  . Follow-up    patient complains of chest discomfort that started 3 weeks agp.      History of Present Illness: [name] is a 74 y.o. female who presents for  New complaints of exertional chest pain. She was just here a month ago for a routine pacemaker check and had no complaints at that time. For the last 3 weeks or so she has noticed chest discomfort that occurs with minimal exertion, such as walking from the parking lot into the grocery store. On one single occasion she also had chest pain at rest while lying in bed. Every time the symptoms have resolved spontaneously, lasting for up to 15 or 20 minutes. Most of the time if she stops activity and sits down and the symptoms gradually subside.  Prior to these recent events, she was exercising at the gym and was describing the fact that she had more energy than before and was losing weight.  She describes her symptoms as a heaviness in the retrosternal area radiating up into her throat and both sides of her jaw. His associates mild shortness of breath. She is currently asymptomatic and feels well today.   she had a normal nuclear perfusion study performed in March 2015.  He previously underwent coronary angiography 15 years ago in 2001 when she had 20-30% stenosis in the proximal LAD.   Interrogation of her pacemaker shows that she has had a few episodes of atrial fibrillation , but no more than her usual pattern. Cannot identify a clear relationship between the episodes of atrial fibrillation and when her chest pressure occurs. In fact, she had a 2-1/2 hour episode of atrial fibrillation last night starting at 11 PM but had no complaints. She had had chest  discomfort earlier in the evening when the rhythm appeared to be normal heart rate histogram distribution is favorable. The overall burden of atrial fibrillation is only 0.4%. During atrial fibrillation she has well-controlled ventricular rate around 80 bpm. She has 98% atrial pacing but virtually never requires ventricular pacing. Remaining battery longevity is greater than 9 years.    Past Medical History  Diagnosis Date  . GLUCOSE INTOLERANCE 12/31/2009  . HYPERLIPIDEMIA 12/31/2009  . ANXIETY 12/31/2009  . DEPRESSION 12/31/2009  . MITRAL REGURGITATION 12/31/2009  . HYPERTENSION 12/31/2009  . CORONARY ARTERY DISEASE 12/31/2009  . MITRAL VALVE PROLAPSE 12/31/2009  . MENOPAUSE, EARLY 12/31/2009  . Unspecified urinary incontinence 12/31/2009  . ABDOMINAL PAIN, LOWER 12/31/2009  . DIVERTICULITIS, HX OF 12/31/2009  . Pacemaker 01/21/2012    dual chamber  . Tachycardia-bradycardia syndrome ([place])     [name] 01/21/2012  . PAD (peripheral artery disease) ([place])   . Heart murmur     "used to say I did" (01/21/2012)  . OSTEOARTHRITIS, HAND 12/31/2009  . [place] DISEASE, CERVICAL 12/31/2009  . [place] DISEASE, LUMBAR 12/31/2009  . Symptomatic sinus bradycardia 01/22/2012  . PAF (paroxysmal atrial fibrillation) ([place]) 01/22/2012  . Sinus node dysfunction ([place]) 01/22/2012  . S/P placement of cardiac pacemaker, medtronic adapta 01/21/12 01/22/2012  . PVD (peripheral vascular disease), hx stents to bil SFAs 02/2010 01/22/2012  . PAD (peripheral artery disease) ([place])  Past Surgical History  Procedure Laterality Date  . Cardiac catheterization  2005    "negative" (01/21/2012)  . Oophorectomy  ~1979  . Partial colectomy  2010  . Carpal tunnel release  2008    "right hand/thumb; carpal tunnel repair; got rid of arthritis" (01/21/2012)  . Insert / replace / remove pacemaker  01/21/2012    initial placement  . Vaginal hysterectomy  1975  . Posterior cervical laminectomy  1985  . Peripheral  arterial stent graft  2012; 2012    "LLE; RLE" (01/21/2012)  . Facelift, lower 2/3  1995    "mini" (01/21/2012)  . Augmentation mammaplasty  1983  . Pacemaker insertion  01/21/2012    Medtronic Adapta, model# ADDRL1, serial# [id number]  . Lower arterial examination  10/28/2011    R. SFA stent mild-moderate mixed density plaque with elevated velocities consistent with 50% diameter reduction. L. SFA stent moderate mixed denisty plaque at mid to distal level consistent with 50-69% diameter reduction.  . Cardiac catheterization  09/17/1999    Mild CAD involving proximal portion of LAD. Mitral valve prolapse w/ mitral regurg. No evidence of renal artery stenosis. Recommended readjustment of antihypertensive medications.  . Persantine myoview stress test  02/25/2010    No scintigraphic evidence of inducible myocardial ischemia. No persantine EKG changes. Non-diagnositc for ishcemia. Low-risk, normal scan.  Marland Kitchen Permanent pacemaker insertion N/A 01/21/2012    Procedure: PERMANENT PACEMAKER INSERTION;  Surgeon: Sanda Klein, MD;  Location: [facility];  Service: Cardiovascular;  Laterality: N/A;     Current Outpatient Prescriptions  Medication Sig Dispense Refill  . amLODipine (NORVASC) 5 MG tablet Take 1 tablet (5 mg total) by mouth daily. KEEP UPCOMING APPOINTMENT 90 tablet 1  . aspirin 81 MG EC tablet Take 81 mg by mouth daily.     . cholecalciferol (VITAMIN D) 1000 UNITS tablet Take 1,000 Units by mouth daily.    . citalopram (CELEXA) 20 MG tablet Take 1 tablet (20 mg total) by mouth daily. 90 tablet 0  . cyanocobalamin 500 MCG tablet Take 1 tablet (500 mcg total) by mouth daily. 90 tablet 3  . Fish Oil OIL by Does not apply route.    Marland Kitchen losartan (COZAAR) 50 MG tablet Take 1 tablet (50 mg total) by mouth daily. KEEP UPCOMING APPOINTMENT 90 tablet 1  . metoprolol succinate (TOPROL-XL) 100 MG 24 hr tablet Take 1 tablet (100 mg total) by mouth daily. KEEP UPCOMING APPOINTMENT 90 tablet 1  . rivaroxaban  (XARELTO) 20 MG TABS tablet Take 1 tablet (20 mg total) by mouth daily with supper. 20 tablet 0   No current facility-administered medications for this visit.    Allergies:   Clonidine derivatives; Plavix; and Exforge    Social History:  The patient  reports that she has quit smoking. Her smoking use included Cigarettes. She has a 7.5 pack-year smoking history. She has never used smokeless tobacco. She reports that she drinks about 9.0 oz of alcohol per week. She reports that she does not use illicit drugs.   Family History:  The patient's family history includes Heart disease in her brother and mother; Hypertension in her brother, brother, and mother; Stroke in her brother, father, and mother.    ROS:  Please see the history of present illness.    Otherwise, review of systems positive for none.   All other systems are reviewed and negative.    PHYSICAL EXAM: VS:  BP 122/70 mmHg  Pulse 82  Ht 5' 4.5" (1.638 m)  Wt 143 lb 14.4 oz (65.273 kg)  BMI 24.33 kg/m2 , BMI Body mass index is 24.33 kg/(m^2).  General: Alert, oriented x3, no distress Head: no evidence of trauma, PERRL, EOMI, no exophtalmos or lid lag, no myxedema, no xanthelasma; normal ears, nose and oropharynx Neck: normal jugular venous pulsations and no hepatojugular reflux; brisk carotid pulses without delay and no carotid bruits Chest: clear to auscultation, no signs of consolidation by percussion or palpation, normal fremitus, symmetrical and full respiratory excursions,  Healthy left subclavian pacemaker site Cardiovascular: normal position and quality of the apical impulse, regular rhythm, normal first and second heart sounds, no murmurs, rubs or gallops Abdomen: no tenderness or distention, no masses by palpation, no abnormal pulsatility or arterial bruits, normal bowel sounds, no hepatosplenomegaly Extremities: no clubbing, cyanosis or edema; 2+ radial, ulnar and brachial pulses bilaterally; 2+ right femoral, posterior  tibial and dorsalis pedis pulses; 2+ left femoral, posterior tibial and dorsalis pedis pulses; no subclavian or femoral bruits Neurological: grossly nonfocal Psych: euthymic mood, full affect   EKG:  EKG is ordered today. The ekg ordered today demonstrates  Atrial paced ventricular sensed rhythm.  He has chronic T-wave inversion in leads V1-V3. There is nonspecific slightly downsloping ST segment depression seen in the inferior leads as well as the lateral leads. This abnormality has been present chronically but appears to be a little more pronounced today.   Recent Labs: No results found for requested labs within last 365 days.    Lipid Panel No results found for: CHOL, TRIG, HDL, CHOLHDL, VLDL, LDLCALC, LDLDIRECT    Wt Readings from Last 3 Encounters:  12/28/14 143 lb 14.4 oz (65.273 kg)  11/27/14 144 lb (65.318 kg)  08/06/14 146 lb (66.225 kg)     ASSESSMENT AND PLAN:  1.  Unstable angina pectoris. Maretta seems to be describing fairly typical new onset exertional angina and one episode of angina at rest. She had a normal nuclear stress test about 18 months ago. Her symptoms are quite compelling and she has known peripheral arterial disease that has required revascularization. In 2001 her coronary angiogram showed mild proximal LAD disease. I think she will be best served by proceeding directly to coronary angiography. She is asymptomatic today and is fully anticoagulated. We'll plan to hold her anticoagulant over the weekend and perform cardiac catheterization early next week.  if she has chest discomfort that lasts more than 20-30 minutes and is not relieved by sublingual nitroglycerin she should immediately called emergency medical services and go to the emergency room.  2. Paroxysmal atrial fibrillation with low prevalence and well-controlled ventricular rate. Although she has had recent atrial fibrillation there is no temporal correlation with her complaints which appear to be  exercise related. CHADSVasc score is at least 5 (age, gender, hypertension, vascular disease).   3.  Severe sinus bradycardia with normally functioning dual-chamber permanent pacemaker (medtronic).  4.  Peripheral arterial disease status post stents to both superficial femoral arteries in 2012, currently asymptomatic  5.  Hyperlipidemia. We recently discussed the fact that she should be on statin medication, but she requested that we delay rechecking her lipids until after she loses some more weight. If she is indeed identified as having new coronary stenoses, we will need to press her to start lipid-lowering therapy.    Current medicines are reviewed at length with the patient today.  The patient does not have concerns regarding medicines.  The following changes have been made:  no change  Labs/ tests ordered  today include:  Orders Placed This Encounter  Procedures  . APTT  . Protime-INR  . CBC  . Comprehensive metabolic panel  . EKG 12-Lead  . LEFT HEART CATHETERIZATION WITH CORONARY ANGIOGRAM      Patient Instructions  Your physician recommends that you return for lab work in: [place] physician has requested that you have a cardiac catheterization  FIRST [place]. YOU WILL NEED TO HOLD YOUR XARELTO 98 HOURS PRIOR TO THE CATH. Cardiac catheterization is used to diagnose and/or treat various heart conditions. Doctors may recommend this procedure for a number of different reasons. The most common reason is to evaluate chest pain. Chest pain can be a symptom of coronary artery disease (CAD), and cardiac catheterization can show whether plaque is narrowing or blocking your heart's arteries. This procedure is also used to evaluate the valves, as well as measure the blood flow and oxygen levels in different parts of your heart. For further information please visit [email or web address]. Please follow instruction sheet, as  given.     Mikael Spray, MD  12/28/2014 6:35 PM    Sanda Klein, MD, [facility] [phone] office [phone] pager

## 2014-12-29 LAB — COMPREHENSIVE METABOLIC PANEL
ALT: 13 U/L (ref 6–29)
AST: 16 U/L (ref 10–35)
Albumin: 4 g/dL (ref 3.6–5.1)
Alkaline Phosphatase: 101 U/L (ref 33–130)
BUN: 16 mg/dL (ref 7–25)
CO2: 31 mmol/L (ref 20–31)
Calcium: 10.9 mg/dL — ABNORMAL HIGH (ref 8.6–10.4)
Chloride: 101 mmol/L (ref 98–110)
Creat: 0.76 mg/dL (ref 0.60–0.93)
Glucose, Bld: 109 mg/dL — ABNORMAL HIGH (ref 65–99)
Potassium: 4.6 mmol/L (ref 3.5–5.3)
Sodium: 141 mmol/L (ref 135–146)
Total Bilirubin: 0.7 mg/dL (ref 0.2–1.2)
Total Protein: 6.7 g/dL (ref 6.1–8.1)

## 2014-12-29 LAB — PROTIME-INR
INR: 1.74 — ABNORMAL HIGH (ref <1.50)
Prothrombin Time: 20.6 seconds — ABNORMAL HIGH (ref 11.6–15.2)

## 2014-12-29 LAB — APTT: aPTT: 43 seconds — ABNORMAL HIGH (ref 24–37)

## 2015-01-01 ENCOUNTER — Encounter (HOSPITAL_COMMUNITY): Payer: Self-pay | Admitting: Interventional Cardiology

## 2015-01-01 ENCOUNTER — Inpatient Hospital Stay (HOSPITAL_COMMUNITY)
Admission: AD | Admit: 2015-01-01 | Discharge: 2015-01-14 | DRG: 234 | Disposition: A | Discharge status: Home Health | Payer: Commercial Managed Care - HMO | Source: Ambulatory Visit | Attending: Cardiothoracic Surgery | Admitting: Cardiothoracic Surgery

## 2015-01-01 ENCOUNTER — Other Ambulatory Visit: Payer: Self-pay | Admitting: *Deleted

## 2015-01-01 ENCOUNTER — Encounter (HOSPITAL_COMMUNITY)
Admission: AD | Disposition: A | Discharge status: Home Health | Payer: Self-pay | Source: Ambulatory Visit | Attending: Cardiothoracic Surgery

## 2015-01-01 DIAGNOSIS — J90 Pleural effusion, not elsewhere classified: Secondary | ICD-10-CM | POA: Diagnosis not present

## 2015-01-01 DIAGNOSIS — Z9049 Acquired absence of other specified parts of digestive tract: Secondary | ICD-10-CM

## 2015-01-01 DIAGNOSIS — I10 Essential (primary) hypertension: Secondary | ICD-10-CM | POA: Diagnosis present

## 2015-01-01 DIAGNOSIS — R079 Chest pain, unspecified: Secondary | ICD-10-CM | POA: Diagnosis present

## 2015-01-01 DIAGNOSIS — I340 Nonrheumatic mitral (valve) insufficiency: Secondary | ICD-10-CM | POA: Diagnosis present

## 2015-01-01 DIAGNOSIS — Z7901 Long term (current) use of anticoagulants: Secondary | ICD-10-CM | POA: Diagnosis not present

## 2015-01-01 DIAGNOSIS — D62 Acute posthemorrhagic anemia: Secondary | ICD-10-CM | POA: Diagnosis not present

## 2015-01-01 DIAGNOSIS — I739 Peripheral vascular disease, unspecified: Secondary | ICD-10-CM | POA: Diagnosis present

## 2015-01-01 DIAGNOSIS — R0989 Other specified symptoms and signs involving the circulatory and respiratory systems: Secondary | ICD-10-CM

## 2015-01-01 DIAGNOSIS — Z888 Allergy status to other drugs, medicaments and biological substances status: Secondary | ICD-10-CM

## 2015-01-01 DIAGNOSIS — Z01810 Encounter for preprocedural cardiovascular examination: Secondary | ICD-10-CM

## 2015-01-01 DIAGNOSIS — E8770 Fluid overload, unspecified: Secondary | ICD-10-CM | POA: Diagnosis present

## 2015-01-01 DIAGNOSIS — Z7982 Long term (current) use of aspirin: Secondary | ICD-10-CM

## 2015-01-01 DIAGNOSIS — I200 Unstable angina: Secondary | ICD-10-CM | POA: Diagnosis present

## 2015-01-01 DIAGNOSIS — R509 Fever, unspecified: Secondary | ICD-10-CM | POA: Diagnosis not present

## 2015-01-01 DIAGNOSIS — I9789 Other postprocedural complications and disorders of the circulatory system, not elsewhere classified: Secondary | ICD-10-CM | POA: Diagnosis not present

## 2015-01-01 DIAGNOSIS — I482 Chronic atrial fibrillation, unspecified: Secondary | ICD-10-CM

## 2015-01-01 DIAGNOSIS — Z950 Presence of cardiac pacemaker: Secondary | ICD-10-CM | POA: Diagnosis not present

## 2015-01-01 DIAGNOSIS — I808 Phlebitis and thrombophlebitis of other sites: Secondary | ICD-10-CM | POA: Diagnosis present

## 2015-01-01 DIAGNOSIS — J9811 Atelectasis: Secondary | ICD-10-CM | POA: Diagnosis not present

## 2015-01-01 DIAGNOSIS — Z9689 Presence of other specified functional implants: Secondary | ICD-10-CM

## 2015-01-01 DIAGNOSIS — R531 Weakness: Secondary | ICD-10-CM | POA: Diagnosis present

## 2015-01-01 DIAGNOSIS — Y92239 Unspecified place in hospital as the place of occurrence of the external cause: Secondary | ICD-10-CM | POA: Diagnosis not present

## 2015-01-01 DIAGNOSIS — R32 Unspecified urinary incontinence: Secondary | ICD-10-CM | POA: Diagnosis present

## 2015-01-01 DIAGNOSIS — Z87891 Personal history of nicotine dependence: Secondary | ICD-10-CM | POA: Diagnosis not present

## 2015-01-01 DIAGNOSIS — R001 Bradycardia, unspecified: Secondary | ICD-10-CM | POA: Diagnosis not present

## 2015-01-01 DIAGNOSIS — I080 Rheumatic disorders of both mitral and aortic valves: Secondary | ICD-10-CM

## 2015-01-01 DIAGNOSIS — I341 Nonrheumatic mitral (valve) prolapse: Secondary | ICD-10-CM | POA: Diagnosis present

## 2015-01-01 DIAGNOSIS — M19049 Primary osteoarthritis, unspecified hand: Secondary | ICD-10-CM | POA: Diagnosis present

## 2015-01-01 DIAGNOSIS — Z8249 Family history of ischemic heart disease and other diseases of the circulatory system: Secondary | ICD-10-CM | POA: Diagnosis not present

## 2015-01-01 DIAGNOSIS — I4891 Unspecified atrial fibrillation: Secondary | ICD-10-CM

## 2015-01-01 DIAGNOSIS — E785 Hyperlipidemia, unspecified: Secondary | ICD-10-CM | POA: Diagnosis present

## 2015-01-01 DIAGNOSIS — I25110 Atherosclerotic heart disease of native coronary artery with unstable angina pectoris: Principal | ICD-10-CM | POA: Diagnosis present

## 2015-01-01 DIAGNOSIS — I480 Paroxysmal atrial fibrillation: Secondary | ICD-10-CM | POA: Diagnosis not present

## 2015-01-01 DIAGNOSIS — Z9889 Other specified postprocedural states: Secondary | ICD-10-CM

## 2015-01-01 DIAGNOSIS — Z951 Presence of aortocoronary bypass graft: Secondary | ICD-10-CM

## 2015-01-01 DIAGNOSIS — R21 Rash and other nonspecific skin eruption: Secondary | ICD-10-CM | POA: Diagnosis not present

## 2015-01-01 DIAGNOSIS — I2510 Atherosclerotic heart disease of native coronary artery without angina pectoris: Secondary | ICD-10-CM

## 2015-01-01 DIAGNOSIS — T465X5A Adverse effect of other antihypertensive drugs, initial encounter: Secondary | ICD-10-CM | POA: Diagnosis not present

## 2015-01-01 HISTORY — PX: CARDIAC CATHETERIZATION: SHX172

## 2015-01-01 SURGERY — LEFT HEART CATH AND CORONARY ANGIOGRAPHY
Anesthesia: LOCAL

## 2015-01-01 MED ORDER — METOPROLOL SUCCINATE ER 100 MG PO TB24
100.0000 mg | ORAL_TABLET | Freq: Every day | ORAL | Status: DC
  Administered 2015-01-02 – 2015-01-03 (×2): 100 mg via ORAL
  Filled 2015-01-01 (×2): qty 1

## 2015-01-01 MED ORDER — VERAPAMIL HCL 2.5 MG/ML IV SOLN
INTRAVENOUS | Status: DC | PRN
  Administered 2015-01-01: 10:00:00 via INTRA_ARTERIAL

## 2015-01-01 MED ORDER — SODIUM CHLORIDE 0.9 % IJ SOLN: 3.0000 mL | Freq: Two times a day (BID) | INTRAMUSCULAR | Status: DC

## 2015-01-01 MED ORDER — SODIUM CHLORIDE 0.9 % IJ SOLN: 3.0000 mL | INTRAMUSCULAR | Status: DC | PRN

## 2015-01-01 MED ORDER — AMLODIPINE BESYLATE 5 MG PO TABS
5.0000 mg | ORAL_TABLET | Freq: Every day | ORAL | Status: DC
Start: 2015-01-02 — End: 2015-01-04
  Administered 2015-01-02 – 2015-01-03 (×2): 5 mg via ORAL
  Filled 2015-01-01: qty 2
  Filled 2015-01-01: qty 1

## 2015-01-01 MED ORDER — LIDOCAINE HCL (PF) 1 % IJ SOLN
INTRAMUSCULAR | Status: DC | PRN
  Administered 2015-01-01: 11:00:00

## 2015-01-01 MED ORDER — HEPARIN (PORCINE) IN NACL 2-0.9 UNIT/ML-% IJ SOLN
INTRAMUSCULAR | Status: AC
  Filled 2015-01-01: qty 1500

## 2015-01-01 MED ORDER — SODIUM CHLORIDE 0.9 % WEIGHT BASED INFUSION
3.0000 mL/kg/h | INTRAVENOUS | Status: DC
  Administered 2015-01-01: 3 mL/kg/h via INTRAVENOUS

## 2015-01-01 MED ORDER — MIDAZOLAM HCL 2 MG/2ML IJ SOLN
INTRAMUSCULAR | Status: AC
  Filled 2015-01-01: qty 2

## 2015-01-01 MED ORDER — MIDAZOLAM HCL 2 MG/2ML IJ SOLN
INTRAMUSCULAR | Status: DC | PRN
  Administered 2015-01-01: 2 mg via INTRAVENOUS

## 2015-01-01 MED ORDER — SODIUM CHLORIDE 0.9 % IV SOLN: 250.0000 mL | INTRAVENOUS | Status: DC | PRN

## 2015-01-01 MED ORDER — SODIUM CHLORIDE 0.9 % IV SOLN
250.0000 mL | INTRAVENOUS | Status: DC | PRN
  Administered 2015-01-04 (×2): via INTRAVENOUS

## 2015-01-01 MED ORDER — CITALOPRAM HYDROBROMIDE 20 MG PO TABS
20.0000 mg | ORAL_TABLET | Freq: Every day | ORAL | Status: DC
  Administered 2015-01-02 – 2015-01-14 (×12): 20 mg via ORAL
  Filled 2015-01-01 (×12): qty 1

## 2015-01-01 MED ORDER — FENTANYL CITRATE (PF) 100 MCG/2ML IJ SOLN
INTRAMUSCULAR | Status: DC | PRN
  Administered 2015-01-01: 25 ug via INTRAVENOUS

## 2015-01-01 MED ORDER — LOSARTAN POTASSIUM 50 MG PO TABS
100.0000 mg | ORAL_TABLET | Freq: Every day | ORAL | Status: DC
Start: 2015-01-02 — End: 2015-01-04
  Administered 2015-01-02 – 2015-01-03 (×2): 100 mg via ORAL
  Filled 2015-01-01 (×2): qty 2

## 2015-01-01 MED ORDER — HEPARIN (PORCINE) IN NACL 100-0.45 UNIT/ML-% IJ SOLN
1050.0000 [IU]/h | INTRAMUSCULAR | Status: DC
  Administered 2015-01-02: 900 [IU]/h via INTRAVENOUS
  Administered 2015-01-02 – 2015-01-03 (×2): 1050 [IU]/h via INTRAVENOUS
  Filled 2015-01-01 (×4): qty 250

## 2015-01-01 MED ORDER — HEPARIN SODIUM (PORCINE) 1000 UNIT/ML IJ SOLN
INTRAMUSCULAR | Status: AC
  Filled 2015-01-01: qty 1

## 2015-01-01 MED ORDER — ONDANSETRON HCL 4 MG/2ML IJ SOLN: 4.0000 mg | Freq: Four times a day (QID) | INTRAMUSCULAR | Status: DC | PRN

## 2015-01-01 MED ORDER — SODIUM CHLORIDE 0.9 % WEIGHT BASED INFUSION: 1.0000 mL/kg/h | INTRAVENOUS | Status: DC

## 2015-01-01 MED ORDER — ASPIRIN 81 MG PO CHEW
81.0000 mg | CHEWABLE_TABLET | ORAL | Status: AC
  Administered 2015-01-01: 81 mg via ORAL

## 2015-01-01 MED ORDER — HEPARIN (PORCINE) IN NACL 100-0.45 UNIT/ML-% IJ SOLN: 900.0000 [IU]/h | INTRAMUSCULAR | Status: DC

## 2015-01-01 MED ORDER — ASPIRIN 81 MG PO CHEW
CHEWABLE_TABLET | ORAL | Status: AC
  Filled 2015-01-01: qty 1

## 2015-01-01 MED ORDER — SODIUM CHLORIDE 0.9 % IJ SOLN
3.0000 mL | Freq: Two times a day (BID) | INTRAMUSCULAR | Status: DC
Start: 2015-01-01 — End: 2015-01-04
  Administered 2015-01-01 – 2015-01-03 (×4): 3 mL via INTRAVENOUS

## 2015-01-01 MED ORDER — IOHEXOL 350 MG/ML SOLN
INTRAVENOUS | Status: DC | PRN
  Administered 2015-01-01: 55 mL via INTRAVENOUS

## 2015-01-01 MED ORDER — LIDOCAINE HCL (PF) 1 % IJ SOLN
INTRAMUSCULAR | Status: AC
  Filled 2015-01-01: qty 30

## 2015-01-01 MED ORDER — HEPARIN SODIUM (PORCINE) 1000 UNIT/ML IJ SOLN
INTRAMUSCULAR | Status: DC | PRN
  Administered 2015-01-01: 3500 [IU] via INTRAVENOUS

## 2015-01-01 MED ORDER — SODIUM CHLORIDE 0.9 % IJ SOLN
3.0000 mL | INTRAMUSCULAR | Status: DC | PRN
Start: 2015-01-01 — End: 2015-01-01

## 2015-01-01 MED ORDER — VERAPAMIL HCL 2.5 MG/ML IV SOLN
INTRAVENOUS | Status: AC
  Filled 2015-01-01: qty 2

## 2015-01-01 MED ORDER — ACETAMINOPHEN 325 MG PO TABS
650.0000 mg | ORAL_TABLET | ORAL | Status: DC | PRN
  Administered 2015-01-01 – 2015-01-03 (×3): 650 mg via ORAL
  Filled 2015-01-01 (×3): qty 2

## 2015-01-01 MED ORDER — FENTANYL CITRATE (PF) 100 MCG/2ML IJ SOLN
INTRAMUSCULAR | Status: AC
  Filled 2015-01-01: qty 2

## 2015-01-01 MED ORDER — SODIUM CHLORIDE 0.9 % WEIGHT BASED INFUSION: 1.0000 mL/kg/h | INTRAVENOUS | Status: AC

## 2015-01-01 MED ORDER — ASPIRIN 81 MG PO CHEW
81.0000 mg | CHEWABLE_TABLET | Freq: Every day | ORAL | Status: DC
  Administered 2015-01-02 – 2015-01-03 (×2): 81 mg via ORAL
  Filled 2015-01-01 (×2): qty 1

## 2015-01-01 MED ORDER — ASPIRIN 81 MG PO TBEC: 81.0000 mg | DELAYED_RELEASE_TABLET | Freq: Every day | ORAL | Status: DC

## 2015-01-01 SURGICAL SUPPLY — 12 items
CATH INFINITI 5 FR JL3.5 (CATHETERS) ×2 IMPLANT
CATH INFINITI 5FR ANG PIGTAIL (CATHETERS) ×2 IMPLANT
CATH INFINITI JR4 5F (CATHETERS) ×2 IMPLANT
DEVICE RAD COMP TR BAND LRG (VASCULAR PRODUCTS) ×2 IMPLANT
GLIDESHEATH SLEND SS 6F .021 (SHEATH) ×2 IMPLANT
KIT HEART LEFT (KITS) ×2 IMPLANT
PACK CARDIAC CATHETERIZATION (CUSTOM PROCEDURE TRAY) ×2 IMPLANT
SYR MEDRAD MARK V 150ML (SYRINGE) ×2 IMPLANT
TRANSDUCER W/STOPCOCK (MISCELLANEOUS) ×2 IMPLANT
TUBING CIL FLEX 10 FLL-RA (TUBING) ×2 IMPLANT
WIRE HITORQ VERSACORE ST 145CM (WIRE) ×2 IMPLANT
WIRE SAFE-T 1.5MM-J .035X260CM (WIRE) ×2 IMPLANT

## 2015-01-01 NOTE — Interval H&P Note (Signed)
Cath Lab Visit (complete for each Cath Lab visit)  Clinical Evaluation Leading to the Procedure:   ACS: No.  Non-ACS:    Anginal Classification: CCS III  Anti-ischemic medical therapy: No Therapy  Non-Invasive Test Results: No non-invasive testing performed  Prior CABG: No previous CABG   Accelerating angina.    History and Physical Interval Note:  01/01/2015 10:09 AM  [name]  has presented today for surgery, with the diagnosis of c/p  The various methods of treatment have been discussed with the patient and family. After consideration of risks, benefits and other options for treatment, the patient has consented to  Procedure(s): Left Heart Cath and Coronary Angiography (N/A) as a surgical intervention .  The patient's history has been reviewed, patient examined, no change in status, stable for surgery.  I have reviewed the patient's chart and labs.  Questions were answered to the patient's satisfaction.     Jeffren Dombek S.

## 2015-01-01 NOTE — Progress Notes (Addendum)
ANTICOAGULATION CONSULT NOTE - Initial Consult  Pharmacy Consult for Heparin Indication: atrial fibrillation and 3V CAD  Allergies  Allergen Reactions  . Clonidine Derivatives     Lowers heart rate  . Plavix [Clopidogrel Bisulfate]     Joint pains  . Exforge [Amlodipine Besylate-Valsartan] Itching and Rash    Patient Measurements: Height: 5' 4.5" (163.8 cm) Weight: 143 lb (64.864 kg) IBW/kg (Calculated) : 55.85 Heparin Dosing Weight: 64.8 kg  Vital Signs: Temp: 98 F (36.7 C) (11/22 1138) Temp Source: Oral (11/22 1138) BP: 111/49 mmHg (11/22 1322) Pulse Rate: 61 (11/22 1322)  Labs from 12/28/14:   Hgb 14.6, Platelet count 173   Creatinine 0.76  Estimated Creatinine Clearance: 54.4 mL/min (by C-G formula based on Cr of 0.76).   Medical History: Past Medical History  Diagnosis Date  . GLUCOSE INTOLERANCE 12/31/2009  . HYPERLIPIDEMIA 12/31/2009  . ANXIETY 12/31/2009  . DEPRESSION 12/31/2009  . MITRAL REGURGITATION 12/31/2009  . HYPERTENSION 12/31/2009  . CORONARY ARTERY DISEASE 12/31/2009  . MITRAL VALVE PROLAPSE 12/31/2009  . MENOPAUSE, EARLY 12/31/2009  . Unspecified urinary incontinence 12/31/2009  . ABDOMINAL PAIN, LOWER 12/31/2009  . DIVERTICULITIS, HX OF 12/31/2009  . Pacemaker 01/21/2012    dual chamber  . Tachycardia-bradycardia syndrome ([place])     [name] 01/21/2012  . PAD (peripheral artery disease) ([place])   . Heart murmur     "used to say I did" (01/21/2012)  . OSTEOARTHRITIS, HAND 12/31/2009  . [place] DISEASE, CERVICAL 12/31/2009  . [place] DISEASE, LUMBAR 12/31/2009  . Symptomatic sinus bradycardia 01/22/2012  . PAF (paroxysmal atrial fibrillation) ([place]) 01/22/2012  . Sinus node dysfunction ([place]) 01/22/2012  . S/P placement of cardiac pacemaker, medtronic adapta 01/21/12 01/22/2012  . PVD (peripheral vascular disease), hx stents to bil SFAs 02/2010 01/22/2012  . PAD (peripheral artery disease) [facility])    Assessment:   74 yr old female s/p cardiac  cath.  Severe 3V CAD, TCTS to see.   On Xarelto prior to admission for afib.  Last dose 11/20 am.   Heparin IV to begin 8 hrs after sheath out.  TR band deflation in progress, expect removal by ~4pm.   No bleeding or hematoma noted.   Recent Xarelto dose may effect heparin levels, so will check aPTTs and heparin levels for now.  Goal of Therapy:  Heparin level 0.3-0.7 units/ml aPTT 66-102 seconds Monitor platelets by anticoagulation protocol: Yes   Plan:   Begin heparin drip at ~midnight tonight at 900 units/hr.  Heparin level and aPTT about 6 hrs after drip begins.  Daily heparin level, aPTT and CBC.  Monitor for any bleeding.  Will follow up plans.    Arty Baumgartner, [place] Pager: [phone] 01/01/2015,2:21 PM

## 2015-01-01 NOTE — Consult Note (Signed)
[address]       [place] [address]             [phone]          Fath[facility] [employer] Medical Record [id number] Date of Birth: Oct 01, 1940  Referring: Larae Grooms MD Primary Cardiologist: Sanda Klein, MD Primary Care: [name], MD  Reason for consultation: Severe 3 vessel coronary artery disease   History of Present Illness:     The patient is a 74 year old female with a history of atrial fibrillation and tachy/brady syndrome, status post PPM 01/2012 and followed by Dr. Sallyanne Kuster. She is also status post bilateral SFA stenting by Dr. Gwenlyn Found in 2012.  She has been told in the past by Dr. Rex Kras that she had mitral valve prolapse, but does not recall having had an echocardiogram. She was in her usual state of health until about 6 weeks ago, when she began to notice heaviness in her chest with exertion and relieved with rest. The episodes were initially with vigorous activity such as exercising at the gym, but have become more frequent and now occur with minimal activity such as walking from the car to the parking lot.  She notes that she frequently has to stop to rest when performing household chores such as mopping. She has noted associated radiation to her neck and jaw, and dyspnea, but denies orthopnea, PND, lower extremity edema.  During one episode that occurred while she was at the gym, she felt like she was having tachycardia and palpitations. Her personal trainer checked her pulse, and it was in the 68s.  The patient was seen in follow up by Dr. Sallyanne Kuster. Her last catheterization was in 2001, and at that time she had a 20-30% stenosis of the proximal LAD.  Her pacemaker was interrogated and showed a few episodes of atrial fibrillation, but nothing corresponding with her symptos. It was recommended that she undergo cardiac catheterization to further delineate her coronary anatomy.  Her Xarelto was discontinued in preparation for the procedure  (last dose on 12/30/2014).  Cardiac catheterization was performed today by Dr. Irish Lack and revealed severe 3 vessel CAD with preserved LV function, EF 55-60%.  TCTS has been consulted for consideration of CABG.      Current Activity/ Functional Status: Patient is independent with mobility/ambulation, transfers, ADL's, IADL's.    Zubrod Score: At the time of surgery this patient's most appropriate activity status/level should be described as: []     0    Normal activity, no symptoms [x]     1    Restricted in physical strenuous activity but ambulatory, able to do out light work []     2    Ambulatory and capable of self care, unable to do work activities, up and about                 more than 50%  Of the time                            []     3    Only limited self care, in bed greater than 50% of waking hours []     4    Completely disabled, no self care, confined to bed or chair []     5    Moribund   Past Medical History: Past Medical History  Diagnosis Date  . GLUCOSE INTOLERANCE 12/31/2009  . HYPERLIPIDEMIA 12/31/2009  .  ANXIETY 12/31/2009  . DEPRESSION 12/31/2009  . MITRAL REGURGITATION 12/31/2009  . HYPERTENSION 12/31/2009  . CORONARY ARTERY DISEASE 12/31/2009  . MITRAL VALVE PROLAPSE 12/31/2009  . MENOPAUSE, EARLY 12/31/2009  . Unspecified urinary incontinence 12/31/2009  . ABDOMINAL PAIN, LOWER 12/31/2009  . DIVERTICULITIS, HX OF 12/31/2009  . Pacemaker 01/21/2012    dual chamber  . Tachycardia-bradycardia syndrome ([place])     [name] 01/21/2012  . PAD (peripheral artery disease) ([facility])   . Heart murmur     "used to say I did" (01/21/2012)  . OSTEOARTHRITIS, HAND 12/31/2009  . [place] DISEASE, CERVICAL 12/31/2009  . [place] DISEASE, LUMBAR 12/31/2009  . Symptomatic sinus bradycardia 01/22/2012  . PAF (paroxysmal atrial fibrillation) ([place]) 01/22/2012  . Sinus node dysfunction ([place]) 01/22/2012  . S/P placement of cardiac pacemaker, medtronic adapta 01/21/12 01/22/2012  . PVD  (peripheral vascular disease), hx stents to bil SFAs 02/2010 01/22/2012  . PAD (peripheral artery disease) ([place])      Past Surgical History: Past Surgical History  Procedure Laterality Date  . Cardiac catheterization  2005    "negative" (01/21/2012)  . Oophorectomy  ~1979  . Partial colectomy  2010  . Carpal tunnel release  2008    "right hand/thumb; carpal tunnel repair; got rid of arthritis" (01/21/2012)  . Insert / replace / remove pacemaker  01/21/2012    initial placement  . Vaginal hysterectomy  1975  . Posterior cervical laminectomy  1985  . Peripheral arterial stent graft by Dr. Gwenlyn Found  2012; 2012    "LLE; RLE" (01/21/2012)  . Facelift, lower 2/3  1995    "mini" (01/21/2012)  . Augmentation mammaplasty  1983  . Pacemaker insertion  01/21/2012    Medtronic Adapta, model# ADDRL1, serial# [id number]  . Lower arterial examination  10/28/2011    R. SFA stent mild-moderate mixed density plaque with elevated velocities consistent with 50% diameter reduction. L. SFA stent moderate mixed denisty plaque at mid to distal level consistent with 50-69% diameter reduction.  . Cardiac catheterization  09/17/1999    Mild CAD involving proximal portion of LAD. Mitral valve prolapse w/ mitral regurg. No evidence of renal artery stenosis. Recommended readjustment of antihypertensive medications.  . Persantine myoview stress test  02/25/2010    No scintigraphic evidence of inducible myocardial ischemia. No persantine EKG changes. Non-diagnositc for ishcemia. Low-risk, normal scan.  Marland Kitchen Permanent pacemaker insertion N/A 01/21/2012    Procedure: PERMANENT PACEMAKER INSERTION;  Surgeon: Sanda Klein, MD;  Location: [facility];  Service: Cardiovascular;  Laterality: N/A;  . Cardiac catheterization N/A 01/01/2015    Procedure: Left Heart Cath and Coronary Angiography;  Surgeon: Jettie Booze, MD;  Location: [facility];  Service: Cardiovascular;  Laterality: N/A;     Social  History: History  Smoking status  . Former Smoker -- 0.75 packs/day for 10 years  . Types: Cigarettes  Smokeless tobacco  . Never Used    Comment: 01/21/2012 "quit smoking ~ 2002"    History  Alcohol Use  . 9.0 oz/week  . 15 Shots of liquor per week    Comment: 01/21/2012 "3 times/wk I have a couple mixed drinks"    Social History   Social History  . Marital Status: Married    Spouse Name: N/A  . Number of Children: 3  . Years of Education: N/A   Occupational History  . retired Chemical engineer    Social History Main Topics  . Smoking status: Former Smoker -- 0.75 packs/day for 10 years  Types: Cigarettes  . Smokeless tobacco: Never Used     Comment: 01/21/2012 "quit smoking ~ 2002"  . Alcohol Use: 9.0 oz/week    15 Shots of liquor per week     Comment: 01/21/2012 "3 times/wk I have a couple mixed drinks"  . Drug Use: No  . Sexual Activity: No   Other Topics Concern  . Not on file   Social History Narrative     Family History: Family History  Problem Relation Age of Onset  . Heart disease Brother     3 brothers with heart disease (2 also with CVA)  . Hypertension Brother   . Heart disease Mother   . Hypertension Mother   . Stroke Mother   . Stroke Father   . Hypertension Brother   . Stroke Brother      Allergies: Allergies  Allergen Reactions  . Clonidine Derivatives     Lowers heart rate  . Plavix [Clopidogrel Bisulfate]     Joint pains  . Exforge [Amlodipine Besylate-Valsartan] Itching and Rash     Medications: Current Facility-Administered Medications  Medication Dose Route Frequency Provider Last Rate Last Dose  . 0.9 %  sodium chloride infusion  250 mL Intravenous PRN Jettie Booze, MD      . 0.9% sodium chloride infusion  1 mL/kg/hr Intravenous Continuous Jettie Booze, MD 64.9 mL/hr at 01/01/15 1110 1 mL/kg/hr at 01/01/15 1110  . acetaminophen (TYLENOL) tablet 650 mg  650 mg Oral Q4H PRN Jettie Booze, MD      .  Derrill Memo ON 01/02/2015] amLODipine (NORVASC) tablet 5 mg  5 mg Oral Daily Jettie Booze, MD      . aspirin chewable tablet 81 mg  81 mg Oral Daily Jettie Booze, MD   81 mg at 01/01/15 1145  . [START ON 01/02/2015] citalopram (CELEXA) tablet 20 mg  20 mg Oral Daily Jettie Booze, MD      . Derrill Memo ON 01/02/2015] losartan (COZAAR) tablet 100 mg  100 mg Oral Daily Jettie Booze, MD      . Derrill Memo ON 01/02/2015] metoprolol succinate (TOPROL-XL) 24 hr tablet 100 mg  100 mg Oral Daily Jettie Booze, MD      . ondansetron [facility]) injection 4 mg  4 mg Intravenous Q6H PRN Jettie Booze, MD      . sodium chloride 0.9 % injection 3 mL  3 mL Intravenous Q12H Jettie Booze, MD   3 mL at 01/01/15 1500  . sodium chloride 0.9 % injection 3 mL  3 mL Intravenous PRN Jettie Booze, MD        Prescriptions prior to admission  Medication Sig Dispense Refill Last Dose  . amLODipine (NORVASC) 5 MG tablet Take 1 tablet (5 mg total) by mouth daily. KEEP UPCOMING APPOINTMENT 90 tablet 1 01/01/2015 at 0530  . aspirin 81 MG EC tablet Take 81 mg by mouth daily.    12/31/2014 at Unknown time  . cholecalciferol (VITAMIN D) 1000 UNITS tablet Take 1,000 Units by mouth daily.   01/01/2015 at 0530  . citalopram (CELEXA) 20 MG tablet Take 1 tablet (20 mg total) by mouth daily. 90 tablet 0 01/01/2015 at 0530  . cyanocobalamin 500 MCG tablet Take 1 tablet (500 mcg total) by mouth daily. 90 tablet 3 01/01/2015 at 0530  . losartan (COZAAR) 100 MG tablet Take 100 mg by mouth daily.   01/01/2015 at 0530  . metoprolol succinate (TOPROL-XL) 100 MG 24 hr tablet  Take 1 tablet (100 mg total) by mouth daily. KEEP UPCOMING APPOINTMENT 90 tablet 1 01/01/2015 at 0530  . Omega-3 Fatty Acids (FISH OIL) 1000 MG CAPS Take 1 capsule by mouth daily.   12/31/2014 at Unknown time  . rivaroxaban (XARELTO) 20 MG TABS tablet Take 1 tablet (20 mg total) by mouth daily with supper. 20 tablet 0 12/30/2014  .  vitamin E 400 UNIT capsule Take 400 Units by mouth daily.   12/31/2014 at Unknown time       Review of Systems:     Cardiac Review of Systems:   Chest Pain [ x ]  Resting SOB [   ] Exertional SOB  [ x-associated with chest discomfort ]  Orthopnea [  ]   Pedal Edema [  ]   Palpitations [ x ]  Syncope  [  ]   Presyncope [   ]  General Review of Systems:  Constitional: recent weight change [ x-intentional weight loss ]; anorexia [  ]; fatigue [  ]; nausea [  ]; night sweats [  ]; fever [  ]; or chills [  ];                                Dental: poor dentition[  ];   Eye : blurred vision [  ]; diplopia [   ]; vision changes [  ];  Amaurosis fugax[  ]; Resp: cough [ x- persistent since she had bronchitis 3-4 months ago ];  wheezing[  ];  hemoptysis[  ]; shortness of breath[  ]; paroxysmal nocturnal dyspnea[  ]; dyspnea on exertion[  ]; or orthopnea[  ];  GI:  gallstones[  ], vomiting[  ];  dysphagia[  ]; melena[  ];  hematochezia [  ]; heartburn[  ];   Hx of  Colonoscopy[  ]; GU: kidney stones [  ]; hematuria[  ];   dysuria [  ];  nocturia[  ];  history of     obstruction [  ];             Skin: rash, swelling[  ];, hair loss[  ];  peripheral edema[  ];  or itching[  ]; Musculosketetal: myalgias[  ];  joint swelling[  ];  joint erythema[  ];  joint pain[  ];  back pain[  ];  Heme/Lymph: bruising[  ];  bleeding[  ];  anemia[  ];  Neuro: TIA[  ];  headaches[  ];  stroke[  ];  vertigo[  ];  seizures[  ];   paresthesias[  ];  difficulty walking[  ];  Psych:depression[  ]; anxiety[  ];  Endocrine: diabetes[  ];  thyroid dysfunction[  ];             Other: H/o colon resection due to diverticulitis with subsequent wound infection back in the 70s, no issues recently   Physical Exam: BP 102/50 mmHg  Pulse 60  Temp(Src) 98 F (36.7 C) (Oral)  Resp 15  Ht 5' 4.5" (1.638 m)  Wt 143 lb (64.864 kg)  BMI 24.18 kg/m2  SpO2 98%  General appearance: alert, cooperative and no distress Neck: No  adenopathy or carotid bruits. Left chest with well healed pacemaker site. Neurologic: intact Heart: regular rate and rhythm, S1, S2 normal, no murmur, click, rub or gallop Lungs: clear to auscultation bilaterally Abdomen: soft, non-tender; bowel sounds normal; no masses, no organomegaly. Multiple abdominal scars from prior  surgeries, well healed Extremities: No clubbing, cyanosis or edema. Palpable femoral and pedal pulses bilaterally. Right wrist with radial pressure dressing in place from catheterization, no surrounding hematoma.   Diagnostic Studies & Laboratory data:     Recent Radiology Findings:   No results found.    Recent Lab Findings: Lab Results  Component Value Date   WBC 7.9 12/28/2014   HGB 14.6 12/28/2014   HCT 42.8 12/28/2014   PLT 173 12/28/2014   GLUCOSE 109* 12/28/2014   ALT 13 12/28/2014   AST 16 12/28/2014   NA 141 12/28/2014   K 4.6 12/28/2014   CL 101 12/28/2014   CREATININE 0.76 12/28/2014   BUN 16 12/28/2014   CO2 31 12/28/2014   INR 1.74* 12/28/2014    Cardiac Catheterization :  Coronary Findings    Dominance: Right   Left Anterior Descending   . Prox LAD to Mid LAD lesion, 80% stenosed.   . Mid LAD lesion, 25% stenosed.   Jorene Minors LAD lesion, 25% stenosed.     Ramus Intermedius   . Ramus lesion, 80% stenosed.     Left Circumflex  . Vessel is small.     Right Coronary Artery   . Mid RCA lesion, 90% stenosed.   . Dist RCA-1 lesion, 80% stenosed. Ulcerative.   . Dist RCA-2 lesion, 50% stenosed.      Wall Motion                 Left Heart    Left Ventricle The left ventricular size is normal. The left ventricular systolic function is normal. The left ventricular ejection fraction is 55-65% by visual estimate. There are no wall motion abnormalities in the left ventricle.   Aortic Valve There is no aortic valve stenosis.    Coronary Diagrams    Diagnostic Diagram          I have independently reviewed the above   cath films and reviewed the findings with the  patient .   Assessment / Plan:   The patient is a 74 year old female former smoker with a history of tachy/brady syndrome, status post PPM, and PAD, status post bilateral SFA stents.  She presents with accelerating angina. Cardiac catheterization today reveals preserved LV function with severe 3 vessel coronary artery disease. She would benefit from surgical revascularization.  The patient an anecdotal history of mitral valve prolapse/MR, but has no recent echo that I can find.  Will check a 2D echo to further delineate any valve issues. She has been on Xarelto since her PPM, but her last dose was 12/30/2014. After reviewed of patients history and cath films I have recommended to her we that  proceed with CABG and poss atrial clip placement.   Needs chest xrya- last one was 01/2012 Needs echo no report back to 12/2009  With patient on xarleto with nl renal function , hold xarleto for 3 days, last dose 4 days before surgery, Plan surgery Friday am  The goals risks and alternatives of the planned surgical procedure CABG poss  Atrial clip. Janeal Holmes been discussed with the patient in detail. The risks of the procedure including death, infection, stroke, myocardial infarction, bleeding, blood transfusion have all been discussed specifically.  I have quoted [name] a 2 % of perioperative mortality and a complication rate as high as 30 %. The patient's questions have been answered.TIOMBE KOSMAN is willing  to proceed with the planned procedure.  In addition to other potential risks  and complications from the surgery, I have made the patient aware of the recent [place] concerning the risk of infection by Myocobacterium chimaera related to the use of [place] 3T heater-cooler equipment during cardiac surgery. I discussed with the patient the low risk of infection, as well as our compliance with the most current FDA recommendations to minimize  infection and testing of all devices for contamination. The patient has been made aware of the limited alternatives to immediately replacing the current equipment. The patient has been informed regarding the risks associated with waiting to proceed with needed surgery and that such risks are greater than the risk of infection related to the use of the heater-cooler device. I did make the patient aware that after careful review of the patients having cardiac surgery at [facility] we have no evidence that heater/cooler related infections have occurred at [facility]. We discussed that this is a slow-growing bacterium, such that it can take some period of time for symptoms to develop.

## 2015-01-01 NOTE — H&P (View-Only) (Signed)
Patient ID: [name], female   DOB: 12-30-1940, 74 y.o.   MRN: [id number]     Cardiology Office Note   Date:  12/28/2014   ID:  [name], DOB 26-Jul-1940, MRN [id number]  PCP:  [name], MD  Cardiologist:   Sanda Klein, MD   Chief Complaint  Patient presents with  . Follow-up    patient complains of chest discomfort that started 3 weeks agp.      History of Present Illness: [name] is a 74 y.o. female who presents for  New complaints of exertional chest pain. She was just here a month ago for a routine pacemaker check and had no complaints at that time. For the last 3 weeks or so she has noticed chest discomfort that occurs with minimal exertion, such as walking from the parking lot into the grocery store. On one single occasion she also had chest pain at rest while lying in bed. Every time the symptoms have resolved spontaneously, lasting for up to 15 or 20 minutes. Most of the time if she stops activity and sits down and the symptoms gradually subside.  Prior to these recent events, she was exercising at the gym and was describing the fact that she had more energy than before and was losing weight.  She describes her symptoms as a heaviness in the retrosternal area radiating up into her throat and both sides of her jaw. His associates mild shortness of breath. She is currently asymptomatic and feels well today.   she had a normal nuclear perfusion study performed in March 2015.  He previously underwent coronary angiography 15 years ago in 2001 when she had 20-30% stenosis in the proximal LAD.   Interrogation of her pacemaker shows that she has had a few episodes of atrial fibrillation , but no more than her usual pattern. Cannot identify a clear relationship between the episodes of atrial fibrillation and when her chest pressure occurs. In fact, she had a 2-1/2 hour episode of atrial fibrillation last night starting at 11 PM but had no complaints. She had had chest  discomfort earlier in the evening when the rhythm appeared to be normal heart rate histogram distribution is favorable. The overall burden of atrial fibrillation is only 0.4%. During atrial fibrillation she has well-controlled ventricular rate around 80 bpm. She has 98% atrial pacing but virtually never requires ventricular pacing. Remaining battery longevity is greater than 9 years.    Past Medical History  Diagnosis Date  . GLUCOSE INTOLERANCE 12/31/2009  . HYPERLIPIDEMIA 12/31/2009  . ANXIETY 12/31/2009  . DEPRESSION 12/31/2009  . MITRAL REGURGITATION 12/31/2009  . HYPERTENSION 12/31/2009  . CORONARY ARTERY DISEASE 12/31/2009  . MITRAL VALVE PROLAPSE 12/31/2009  . MENOPAUSE, EARLY 12/31/2009  . Unspecified urinary incontinence 12/31/2009  . ABDOMINAL PAIN, LOWER 12/31/2009  . DIVERTICULITIS, HX OF 12/31/2009  . Pacemaker 01/21/2012    dual chamber  . Tachycardia-bradycardia syndrome ([place])     [name] 01/21/2012  . PAD (peripheral artery disease) ([place])   . Heart murmur     "used to say I did" (01/21/2012)  . OSTEOARTHRITIS, HAND 12/31/2009  . [place] DISEASE, CERVICAL 12/31/2009  . [place] DISEASE, LUMBAR 12/31/2009  . Symptomatic sinus bradycardia 01/22/2012  . PAF (paroxysmal atrial fibrillation) ([place]) 01/22/2012  . Sinus node dysfunction ([place]) 01/22/2012  . S/P placement of cardiac pacemaker, medtronic adapta 01/21/12 01/22/2012  . PVD (peripheral vascular disease), hx stents to bil SFAs 02/2010 01/22/2012  . PAD (peripheral artery disease) ([place])  Past Surgical History  Procedure Laterality Date  . Cardiac catheterization  2005    "negative" (01/21/2012)  . Oophorectomy  ~1979  . Partial colectomy  2010  . Carpal tunnel release  2008    "right hand/thumb; carpal tunnel repair; got rid of arthritis" (01/21/2012)  . Insert / replace / remove pacemaker  01/21/2012    initial placement  . Vaginal hysterectomy  1975  . Posterior cervical laminectomy  1985  . Peripheral  arterial stent graft  2012; 2012    "LLE; RLE" (01/21/2012)  . Facelift, lower 2/3  1995    "mini" (01/21/2012)  . Augmentation mammaplasty  1983  . Pacemaker insertion  01/21/2012    Medtronic Adapta, model# ADDRL1, serial# [id number]  . Lower arterial examination  10/28/2011    R. SFA stent mild-moderate mixed density plaque with elevated velocities consistent with 50% diameter reduction. L. SFA stent moderate mixed denisty plaque at mid to distal level consistent with 50-69% diameter reduction.  . Cardiac catheterization  09/17/1999    Mild CAD involving proximal portion of LAD. Mitral valve prolapse w/ mitral regurg. No evidence of renal artery stenosis. Recommended readjustment of antihypertensive medications.  . Persantine myoview stress test  02/25/2010    No scintigraphic evidence of inducible myocardial ischemia. No persantine EKG changes. Non-diagnositc for ishcemia. Low-risk, normal scan.  Marland Kitchen Permanent pacemaker insertion N/A 01/21/2012    Procedure: PERMANENT PACEMAKER INSERTION;  Surgeon: Sanda Klein, MD;  Location: [facility];  Service: Cardiovascular;  Laterality: N/A;     Current Outpatient Prescriptions  Medication Sig Dispense Refill  . amLODipine (NORVASC) 5 MG tablet Take 1 tablet (5 mg total) by mouth daily. KEEP UPCOMING APPOINTMENT 90 tablet 1  . aspirin 81 MG EC tablet Take 81 mg by mouth daily.     . cholecalciferol (VITAMIN D) 1000 UNITS tablet Take 1,000 Units by mouth daily.    . citalopram (CELEXA) 20 MG tablet Take 1 tablet (20 mg total) by mouth daily. 90 tablet 0  . cyanocobalamin 500 MCG tablet Take 1 tablet (500 mcg total) by mouth daily. 90 tablet 3  . Fish Oil OIL by Does not apply route.    Marland Kitchen losartan (COZAAR) 50 MG tablet Take 1 tablet (50 mg total) by mouth daily. KEEP UPCOMING APPOINTMENT 90 tablet 1  . metoprolol succinate (TOPROL-XL) 100 MG 24 hr tablet Take 1 tablet (100 mg total) by mouth daily. KEEP UPCOMING APPOINTMENT 90 tablet 1  . rivaroxaban  (XARELTO) 20 MG TABS tablet Take 1 tablet (20 mg total) by mouth daily with supper. 20 tablet 0   No current facility-administered medications for this visit.    Allergies:   Clonidine derivatives; Plavix; and Exforge    Social History:  The patient  reports that she has quit smoking. Her smoking use included Cigarettes. She has a 7.5 pack-year smoking history. She has never used smokeless tobacco. She reports that she drinks about 9.0 oz of alcohol per week. She reports that she does not use illicit drugs.   Family History:  The patient's family history includes Heart disease in her brother and mother; Hypertension in her brother, brother, and mother; Stroke in her brother, father, and mother.    ROS:  Please see the history of present illness.    Otherwise, review of systems positive for none.   All other systems are reviewed and negative.    PHYSICAL EXAM: VS:  BP 122/70 mmHg  Pulse 82  Ht 5' 4.5" (1.638 m)  Wt 143 lb 14.4 oz (65.273 kg)  BMI 24.33 kg/m2 , BMI Body mass index is 24.33 kg/(m^2).  General: Alert, oriented x3, no distress Head: no evidence of trauma, PERRL, EOMI, no exophtalmos or lid lag, no myxedema, no xanthelasma; normal ears, nose and oropharynx Neck: normal jugular venous pulsations and no hepatojugular reflux; brisk carotid pulses without delay and no carotid bruits Chest: clear to auscultation, no signs of consolidation by percussion or palpation, normal fremitus, symmetrical and full respiratory excursions,  Healthy left subclavian pacemaker site Cardiovascular: normal position and quality of the apical impulse, regular rhythm, normal first and second heart sounds, no murmurs, rubs or gallops Abdomen: no tenderness or distention, no masses by palpation, no abnormal pulsatility or arterial bruits, normal bowel sounds, no hepatosplenomegaly Extremities: no clubbing, cyanosis or edema; 2+ radial, ulnar and brachial pulses bilaterally; 2+ right femoral, posterior  tibial and dorsalis pedis pulses; 2+ left femoral, posterior tibial and dorsalis pedis pulses; no subclavian or femoral bruits Neurological: grossly nonfocal Psych: euthymic mood, full affect   EKG:  EKG is ordered today. The ekg ordered today demonstrates  Atrial paced ventricular sensed rhythm.  He has chronic T-wave inversion in leads V1-V3. There is nonspecific slightly downsloping ST segment depression seen in the inferior leads as well as the lateral leads. This abnormality has been present chronically but appears to be a little more pronounced today.   Recent Labs: No results found for requested labs within last 365 days.    Lipid Panel No results found for: CHOL, TRIG, HDL, CHOLHDL, VLDL, LDLCALC, LDLDIRECT    Wt Readings from Last 3 Encounters:  12/28/14 143 lb 14.4 oz (65.273 kg)  11/27/14 144 lb (65.318 kg)  08/06/14 146 lb (66.225 kg)     ASSESSMENT AND PLAN:  1.  Unstable angina pectoris. [name] seems to be describing fairly typical new onset exertional angina and one episode of angina at rest. She had a normal nuclear stress test about 18 months ago. Her symptoms are quite compelling and she has known peripheral arterial disease that has required revascularization. In 2001 her coronary angiogram showed mild proximal LAD disease. I think she will be best served by proceeding directly to coronary angiography. She is asymptomatic today and is fully anticoagulated. We'll plan to hold her anticoagulant over the weekend and perform cardiac catheterization early next week.  if she has chest discomfort that lasts more than 20-30 minutes and is not relieved by sublingual nitroglycerin she should immediately called emergency medical services and go to the emergency room.  2. Paroxysmal atrial fibrillation with low prevalence and well-controlled ventricular rate. Although she has had recent atrial fibrillation there is no temporal correlation with her complaints which appear to be  exercise related. CHADSVasc score is at least 5 (age, gender, hypertension, vascular disease).   3.  Severe sinus bradycardia with normally functioning dual-chamber permanent pacemaker (medtronic).  4.  Peripheral arterial disease status post stents to both superficial femoral arteries in 2012, currently asymptomatic  5.  Hyperlipidemia. We recently discussed the fact that she should be on statin medication, but she requested that we delay rechecking her lipids until after she loses some more weight. If she is indeed identified as having new coronary stenoses, we will need to press her to start lipid-lowering therapy.    Current medicines are reviewed at length with the patient today.  The patient does not have concerns regarding medicines.  The following changes have been made:  no change  Labs/ tests ordered  today include:  Orders Placed This Encounter  Procedures  . APTT  . Protime-INR  . CBC  . Comprehensive metabolic panel  . EKG 12-Lead  . LEFT HEART CATHETERIZATION WITH CORONARY ANGIOGRAM      Patient Instructions  Your physician recommends that you return for lab work in: [place] physician has requested that you have a cardiac catheterization  FIRST [place]. YOU WILL NEED TO HOLD YOUR XARELTO 46 HOURS PRIOR TO THE CATH. Cardiac catheterization is used to diagnose and/or treat various heart conditions. Doctors may recommend this procedure for a number of different reasons. The most common reason is to evaluate chest pain. Chest pain can be a symptom of coronary artery disease (CAD), and cardiac catheterization can show whether plaque is narrowing or blocking your heart's arteries. This procedure is also used to evaluate the valves, as well as measure the blood flow and oxygen levels in different parts of your heart. For further information please visit [email or web address]. Please follow instruction sheet, as  given.     Mikael Spray, MD  12/28/2014 6:35 PM    Sanda Klein, MD, [facility] [phone] office [phone] pager

## 2015-01-01 NOTE — Progress Notes (Signed)
UR Completed Shadasia Oldfield Graves-Bigelow, RN,BSN [phone]  

## 2015-01-02 ENCOUNTER — Encounter (HOSPITAL_COMMUNITY): Payer: Self-pay

## 2015-01-02 ENCOUNTER — Inpatient Hospital Stay (HOSPITAL_COMMUNITY): Payer: Commercial Managed Care - HMO

## 2015-01-02 DIAGNOSIS — R079 Chest pain, unspecified: Secondary | ICD-10-CM

## 2015-01-02 DIAGNOSIS — I200 Unstable angina: Secondary | ICD-10-CM

## 2015-01-02 DIAGNOSIS — Z01810 Encounter for preprocedural cardiovascular examination: Secondary | ICD-10-CM

## 2015-01-02 LAB — PULMONARY FUNCTION TEST
DL/VA % pred: 87 %
DL/VA: 4.14 ml/min/mmHg/L
DLCO cor % pred: 66 %
DLCO cor: 15.64 ml/min/mmHg
DLCO unc % pred: 67 %
DLCO unc: 16.01 ml/min/mmHg
FEF 25-75 Post: 1.46 L/sec
FEF 25-75 Pre: 1.41 L/sec
FEF2575-%Change-Post: 3 %
FEF2575-%Pred-Post: 89 %
FEF2575-%Pred-Pre: 85 %
FEV1-%Change-Post: 0 %
FEV1-%Pred-Post: 81 %
FEV1-%Pred-Pre: 81 %
FEV1-Post: 1.68 L
FEV1-Pre: 1.68 L
FEV1FVC-%Change-Post: 10 %
FEV1FVC-%Pred-Pre: 102 %
FEV6-%Change-Post: -9 %
FEV6-%Pred-Post: 75 %
FEV6-%Pred-Pre: 83 %
FEV6-Post: 1.97 L
FEV6-Pre: 2.18 L
FEV6FVC-%Pred-Post: 105 %
FEV6FVC-%Pred-Pre: 105 %
FVC-%Change-Post: -9 %
FVC-%Pred-Post: 71 %
FVC-%Pred-Pre: 78 %
FVC-Post: 1.97 L
FVC-Pre: 2.18 L
Post FEV1/FVC ratio: 86 %
Post FEV6/FVC ratio: 100 %
Pre FEV1/FVC ratio: 77 %
Pre FEV6/FVC Ratio: 100 %
RV % pred: 84 %
RV: 1.91 L
TLC % pred: 87 %
TLC: 4.36 L

## 2015-01-02 LAB — BASIC METABOLIC PANEL
Anion gap: 5 (ref 5–15)
BUN: 5 mg/dL — ABNORMAL LOW (ref 6–20)
CO2: 30 mmol/L (ref 22–32)
Calcium: 10.3 mg/dL (ref 8.9–10.3)
Chloride: 105 mmol/L (ref 101–111)
Creatinine, Ser: 0.74 mg/dL (ref 0.44–1.00)
GFR calc Af Amer: >60 mL/min (ref >60)
GFR calc non Af Amer: >60 mL/min (ref >60)
Glucose, Bld: 122 mg/dL — ABNORMAL HIGH (ref 65–99)
Potassium: 5 mmol/L (ref 3.5–5.1)
Sodium: 140 mmol/L (ref 135–145)

## 2015-01-02 LAB — APTT
aPTT: 62 seconds — ABNORMAL HIGH (ref 24–37)
aPTT: 66 seconds — ABNORMAL HIGH (ref 24–37)

## 2015-01-02 LAB — CBC
HCT: 41.8 % (ref 36.0–46.0)
Hemoglobin: 14.2 g/dL (ref 12.0–15.0)
MCH: 31.8 pg (ref 26.0–34.0)
MCHC: 34 g/dL (ref 30.0–36.0)
MCV: 93.7 fL (ref 78.0–100.0)
Platelets: 224 10*3/uL (ref 150–400)
RBC: 4.46 MIL/uL (ref 3.87–5.11)
RDW: 12.6 % (ref 11.5–15.5)
WBC: 4.6 10*3/uL (ref 4.0–10.5)

## 2015-01-02 LAB — HEPARIN LEVEL (UNFRACTIONATED)
Heparin Unfractionated: 0.27 IU/mL — ABNORMAL LOW (ref 0.30–0.70)
Heparin Unfractionated: 0.25 IU/mL — ABNORMAL LOW (ref 0.30–0.70)
Heparin Unfractionated: 0.38 IU/mL (ref 0.30–0.70)

## 2015-01-02 MED ORDER — ALBUTEROL SULFATE (2.5 MG/3ML) 0.083% IN NEBU
2.5000 mg | INHALATION_SOLUTION | Freq: Once | RESPIRATORY_TRACT | Status: AC
  Administered 2015-01-02: 2.5 mg via RESPIRATORY_TRACT

## 2015-01-02 MED ORDER — NITROGLYCERIN 0.3 MG/HR TD PT24
0.3000 mg | MEDICATED_PATCH | Freq: Every day | TRANSDERMAL | Status: DC
Start: 2015-01-02 — End: 2015-01-04
  Administered 2015-01-02 – 2015-01-03 (×2): 0.3 mg via TRANSDERMAL
  Filled 2015-01-02 (×3): qty 1

## 2015-01-02 NOTE — Progress Notes (Signed)
ANTICOAGULATION CONSULT NOTE - Follow Up Consult  Pharmacy Consult for heparin Indication: atrial fibrillation and 3V CAD  Allergies  Allergen Reactions  . Clonidine Derivatives     Lowers heart rate  . Plavix [Clopidogrel Bisulfate]     Joint pains  . Exforge [Amlodipine Besylate-Valsartan] Itching and Rash    Patient Measurements: Height: 5' 4.5" (163.8 cm) Weight: 143 lb 3.2 oz (64.955 kg) IBW/kg (Calculated) : 55.85 Heparin Dosing Weight: 65 kg  Vital Signs: Temp: 98 F (36.7 C) (11/23 0450) Temp Source: Oral (11/23 0450) BP: 145/68 mmHg (11/23 0450) Pulse Rate: 61 (11/23 0450)  Labs:  Recent Labs  01/02/15 0550 01/02/15 1049  HGB 14.2  --   HCT 41.8  --   PLT 224  --   APTT 62* 66*  HEPARINUNFRC 0.27* 0.25*  CREATININE 0.74  --     Estimated Creatinine Clearance: 54.4 mL/min (by C-G formula based on Cr of 0.74).   Medications:  Infusions:  . heparin 900 Units/hr (01/02/15 0005)    Assessment: 74 y/o female on Xarelto PTA for Afib s/p cath with 3V CAD awaiting CABG on Fri. She continues on IV heparin until her CABG. Xarelto is on hold with last dose on 11/20 am.  Heparin level is subtherapeutic at 0.25, aPTT is 66 and on the low end of normal - these are now correlating and trust the heparin level is more accurate. No bleeding noted, CBC is stable.   Goal of Therapy:  Heparin level 0.3-0.7 units/ml Monitor platelets by anticoagulation protocol: Yes   Plan:  - Increase heparin drip to 1050 units/hr - 6 hr heparin level - Daily heparin level and CBC - Discontinue aPTTs - Monitor for s/sx of bleeding  [facility], Pharm.D., BCPS Clinical Pharmacist Pager: [phone] 01/02/2015 11:51 AM

## 2015-01-02 NOTE — Progress Notes (Signed)
VASCULAR LAB PRELIMINARY  PRELIMINARY  PRELIMINARY  PRELIMINARY  Pre-op Cardiac Surgery  Carotid Findings:  Bilateral:  1-39% ICA stenosis.  Vertebral artery flow is antegrade.     Upper Extremity Right Left  Brachial Pressures 139 Triphasic 149 Triphasic  Radial Waveforms Triphasic Triphasic  Ulnar Waveforms Triphasic Triphasic  Palmar Arch (Allen's Test) Normal Abnormal   Findings:  Doppler waveforms remained normal with both radial and ulnar compressions on the right. Left Doppler waveforms remained noraml with radial compression and reversed with ulnar comrassion    Lower  Extremity Right Left  Dorsalis Pedis 144 Triphasic 148 Triphasic  Posterior Tibial 161 Triphasic 152 Triphasic  Ankle/Brachial Indices 1.01 0.96    Findings: Study was completed 12/14/2014 at [facility] with the results listed above.     Lashya Passe, RVS 01/02/2015, 10:58 AM

## 2015-01-02 NOTE — Progress Notes (Signed)
ANTICOAGULATION CONSULT NOTE - Follow Up Consult  Pharmacy Consult for heparin Indication: atrial fibrillation and 3V CAD  Allergies  Allergen Reactions  . Clonidine Derivatives     Lowers heart rate  . Plavix [Clopidogrel Bisulfate]     Joint pains  . Exforge [Amlodipine Besylate-Valsartan] Itching and Rash    Patient Measurements: Height: 5' 4.5" (163.8 cm) Weight: 143 lb 3.2 oz (64.955 kg) IBW/kg (Calculated) : 55.85 Heparin Dosing Weight: 65 kg  Vital Signs: Temp: 98.6 F (37 C) (11/23 1731) Temp Source: Oral (11/23 1731) BP: 128/74 mmHg (11/23 1731) Pulse Rate: 61 (11/23 1731)  Labs:  Recent Labs  01/02/15 0550 01/02/15 1049 01/02/15 1847  HGB 14.2  --   --   HCT 41.8  --   --   PLT 224  --   --   APTT 62* 66*  --   HEPARINUNFRC 0.27* 0.25* 0.38  CREATININE 0.74  --   --     Estimated Creatinine Clearance: 54.4 mL/min (by C-G formula based on Cr of 0.74).   Medications:  Infusions:  . heparin 1,050 Units/hr (01/02/15 1315)    Assessment: 74 y/o female on Xarelto PTA for Afib s/p cath with 3V CAD awaiting CABG on Fri. She continues on IV heparin until her CABG. Xarelto is on hold with last dose on 11/20 am.  Heparin level is subtherapeutic at 0.25, aPTT is 66 and on the low end of normal - these are now correlating and trust the heparin level is more accurate. No bleeding noted, CBC is stable.   PM f/u - heparin level at goal on heparin 1050 units/hr.  No bleeding or complications noted per chart notes.  Goal of Therapy:  Heparin level 0.3-0.7 units/ml Monitor platelets by anticoagulation protocol: Yes   Plan:  -Continue IV heparin at current rate. -Daily heparin level and CBC.  Uvaldo Rising, BCPS  Clinical Pharmacist Pager [phone]  01/02/2015 8:02 PM

## 2015-01-02 NOTE — Progress Notes (Signed)
  Echocardiogram 2D Echocardiogram has been performed.  Tresa Res 01/02/2015, 9:23 AM

## 2015-01-02 NOTE — Progress Notes (Signed)
During pt assessment, pt complained about her left arm IV being sore. IV site was slightly swollen. I changed her Heparin over to her other site. I encouraged the pt to let me take out the IV but pt refused stating that they will just have to re-stick her tomorrow or Friday before her procedure and didn't want to be stuck again. I told the pt we will just keep a close eye out on the IV site. Pt agreed. Will continue to monitor.  Albertina Senegal E

## 2015-01-02 NOTE — Progress Notes (Signed)
       Patient Name: [name] Date of Encounter: 01/02/2015    SUBJECTIVE:Had brief discomfort this AM when she went to BR but it resolved in minutes after gettng back to bed. Now pain free.  TELEMETRY:  NSR Filed Vitals:   01/01/15 1600 01/01/15 1944 01/01/15 2014 01/02/15 0450  BP: 117/65 155/73 141/64 145/68  Pulse:   60 61  Temp:   98.6 F (37 C) 98 F (36.7 C)  TempSrc:   Oral Oral  Resp:      Height:      Weight:    64.955 kg (143 lb 3.2 oz)  SpO2:   95% 98%    Intake/Output Summary (Last 24 hours) at 01/02/15 0948 Last data filed at 01/01/15 1821  Gross per 24 hour  Intake    653 ml  Output      0 ml  Net    653 ml   LABS: Basic Metabolic Panel:  Recent Labs  01/02/15 0550  NA 140  K 5.0  CL 105  CO2 30  GLUCOSE 122*  BUN 5*  CREATININE 0.74  CALCIUM 10.3   CBC:  Recent Labs  01/02/15 0550  WBC 4.6  HGB 14.2  HCT 41.8  MCV 93.7  PLT 224     Radiology/Studies:  No new data  Physical Exam: Blood pressure 145/68, pulse 61, temperature 98 F (36.7 C), temperature source Oral, resp. rate 15, height 5' 4.5" (1.638 m), weight 64.955 kg (143 lb 3.2 oz), SpO2 98 %. Weight change: 0.091 kg (3.2 oz)  Wt Readings from Last 3 Encounters:  01/02/15 64.955 kg (143 lb 3.2 oz)  12/28/14 65.273 kg (143 lb 14.4 oz)  11/27/14 65.318 kg (144 lb)    Chest is clear Cardiac exam reveals no gallop or murmur. No edema and cath site unremarkable.  ASSESSMENT:  1. Severe 3 Vessel CAD awaiting CABG 2. PAD 3. Sick sinus syndrome with PPM. Last tracing with AV sequential pacing.  Plan:  1. Start LA nitrate to combat recurrent angina. 2. Continue IV heparin and oral beta blocker 3. Urged to notify is CP 4. Surgery is scheduled for 01/04/15.  Demetrios Isaacs 01/02/2015, 9:48 AM

## 2015-01-02 NOTE — Care Management Note (Signed)
Case Management Note  Patient Details  Name: [name] MRN: [id number] Date of Birth: 1941-01-19  Subjective/Objective:      Pt admitted with Accelerating Angina              Action/Plan:  Pt is independent from home.  Pt is scheduled for CABG 01/04/15.  Daughter will provide 24 hour supervision post discharge for recommended time period.  CM will continue to monitor for disposition needs   Expected Discharge Date:                  Expected Discharge Plan:  Home/Self Care  In-House Referral:     Discharge planning Services  CM Consult  Post Acute Care Choice:    Choice offered to:     DME Arranged:    DME Agency:     HH Arranged:    HH Agency:     Status of Service:  In process, will continue to follow  Medicare Important Message Given:    Date Medicare IM Given:    Medicare IM give by:    Date Additional Medicare IM Given:    Additional Medicare Important Message give by:     If discussed at [place] of Stay Meetings, dates discussed:    Additional Comments:  Maryclare Labrador, RN 01/02/2015, 11:20 AM

## 2015-01-02 NOTE — Progress Notes (Signed)
ANTICOAGULATION CONSULT NOTE - Follow Up Consult  Pharmacy Consult for heparin Indication: Afib and CAD awaiting CABG   Labs:  Recent Labs  01/02/15 0550  HGB 14.2  HCT 41.8  PLT 224  APTT 62*  HEPARINUNFRC 0.27*  CREATININE 0.74    Assessment/Plan:  75yo female slightly subtherapeutic on heparin with initial dosing while Xarelto on hold for CABG Friday, expect will continue to accumulate. Will continue gtt at current rate and check additional level.   Wynona Neat, PharmD, BCPS  01/02/2015,7:25 AM

## 2015-01-03 LAB — CBC
HCT: 40.1 % (ref 36.0–46.0)
Hemoglobin: 13.2 g/dL (ref 12.0–15.0)
MCH: 31.1 pg (ref 26.0–34.0)
MCHC: 32.9 g/dL (ref 30.0–36.0)
MCV: 94.6 fL (ref 78.0–100.0)
Platelets: 228 10*3/uL (ref 150–400)
RBC: 4.24 MIL/uL (ref 3.87–5.11)
RDW: 12.6 % (ref 11.5–15.5)
WBC: 5.2 10*3/uL (ref 4.0–10.5)

## 2015-01-03 LAB — PROTIME-INR
INR: 1.05 (ref 0.00–1.49)
Prothrombin Time: 13.9 seconds (ref 11.6–15.2)

## 2015-01-03 LAB — ABO/RH: ABO/RH(D): A POS

## 2015-01-03 LAB — TYPE AND SCREEN
ABO/RH(D): A POS
Antibody Screen: NEGATIVE

## 2015-01-03 LAB — HEPARIN LEVEL (UNFRACTIONATED): Heparin Unfractionated: 0.58 IU/mL (ref 0.30–0.70)

## 2015-01-03 MED ORDER — BISACODYL 5 MG PO TBEC
5.0000 mg | DELAYED_RELEASE_TABLET | Freq: Once | ORAL | Status: AC
  Administered 2015-01-03: 5 mg via ORAL
  Filled 2015-01-03: qty 1

## 2015-01-03 MED ORDER — PHENYLEPHRINE HCL 10 MG/ML IJ SOLN
30.0000 ug/min | INTRAVENOUS | Status: DC
  Filled 2015-01-03: qty 2

## 2015-01-03 MED ORDER — SODIUM CHLORIDE 0.9 % IV SOLN
INTRAVENOUS | Status: DC
  Filled 2015-01-03: qty 30

## 2015-01-03 MED ORDER — DEXTROSE 5 % IV SOLN
750.0000 mg | INTRAVENOUS | Status: DC
  Filled 2015-01-03: qty 750

## 2015-01-03 MED ORDER — LORATADINE 10 MG PO TABS
10.0000 mg | ORAL_TABLET | Freq: Every day | ORAL | Status: DC
  Administered 2015-01-03 – 2015-01-06 (×3): 10 mg via ORAL
  Filled 2015-01-03 (×7): qty 1

## 2015-01-03 MED ORDER — METOPROLOL TARTRATE 12.5 MG HALF TABLET
12.5000 mg | ORAL_TABLET | Freq: Once | ORAL | Status: AC
  Administered 2015-01-04: 12.5 mg via ORAL
  Filled 2015-01-03: qty 1

## 2015-01-03 MED ORDER — SODIUM CHLORIDE 0.9 % IV SOLN
INTRAVENOUS | Status: AC
  Administered 2015-01-04: 1 [IU]/h via INTRAVENOUS
  Filled 2015-01-03: qty 2.5

## 2015-01-03 MED ORDER — SODIUM CHLORIDE 0.9 % IV SOLN
INTRAVENOUS | Status: AC
  Administered 2015-01-04: 69.8 mL/h via INTRAVENOUS
  Filled 2015-01-03: qty 40

## 2015-01-03 MED ORDER — ALPRAZOLAM 0.25 MG PO TABS: 0.2500 mg | ORAL_TABLET | ORAL | Status: DC | PRN

## 2015-01-03 MED ORDER — CHLORHEXIDINE GLUCONATE 0.12 % MT SOLN
15.0000 mL | Freq: Once | OROMUCOSAL | Status: AC
Start: 2015-01-04 — End: 2015-01-04
  Administered 2015-01-04: 15 mL via OROMUCOSAL
  Filled 2015-01-03: qty 15

## 2015-01-03 MED ORDER — NITROGLYCERIN IN D5W 200-5 MCG/ML-% IV SOLN
2.0000 ug/min | INTRAVENOUS | Status: AC
  Administered 2015-01-04: 5 ug/min via INTRAVENOUS
  Filled 2015-01-03: qty 250

## 2015-01-03 MED ORDER — EPINEPHRINE HCL 1 MG/ML IJ SOLN
0.0000 ug/min | INTRAMUSCULAR | Status: DC
  Filled 2015-01-03: qty 4

## 2015-01-03 MED ORDER — DOPAMINE-DEXTROSE 3.2-5 MG/ML-% IV SOLN
0.0000 ug/kg/min | INTRAVENOUS | Status: DC
Start: 2015-01-04 — End: 2015-01-04
  Filled 2015-01-03: qty 250

## 2015-01-03 MED ORDER — TEMAZEPAM 15 MG PO CAPS
15.0000 mg | ORAL_CAPSULE | Freq: Once | ORAL | Status: AC | PRN
  Administered 2015-01-03: 15 mg via ORAL
  Filled 2015-01-03: qty 1

## 2015-01-03 MED ORDER — VANCOMYCIN HCL 10 G IV SOLR
1250.0000 mg | INTRAVENOUS | Status: AC
  Administered 2015-01-04: 1250 mg via INTRAVENOUS
  Filled 2015-01-03 (×2): qty 1250

## 2015-01-03 MED ORDER — DEXMEDETOMIDINE HCL IN NACL 400 MCG/100ML IV SOLN
0.1000 ug/kg/h | INTRAVENOUS | Status: AC
Start: 2015-01-04 — End: 2015-01-04
  Administered 2015-01-04: .3 ug/kg/h via INTRAVENOUS
  Filled 2015-01-03: qty 100

## 2015-01-03 MED ORDER — MAGNESIUM SULFATE 50 % IJ SOLN
40.0000 meq | INTRAMUSCULAR | Status: DC
  Filled 2015-01-03: qty 10

## 2015-01-03 MED ORDER — POTASSIUM CHLORIDE 2 MEQ/ML IV SOLN
80.0000 meq | INTRAVENOUS | Status: DC
  Filled 2015-01-03: qty 40

## 2015-01-03 MED ORDER — CHLORHEXIDINE GLUCONATE 4 % EX LIQD
60.0000 mL | Freq: Once | CUTANEOUS | Status: AC
  Administered 2015-01-03: 4 via TOPICAL
  Filled 2015-01-03: qty 60

## 2015-01-03 MED ORDER — FLUTICASONE PROPIONATE 50 MCG/ACT NA SUSP
2.0000 | Freq: Every day | NASAL | Status: DC
  Administered 2015-01-03: 2 via NASAL
  Filled 2015-01-03 (×2): qty 16

## 2015-01-03 MED ORDER — PAPAVERINE HCL 30 MG/ML IJ SOLN
INTRAMUSCULAR | Status: AC
  Administered 2015-01-04: 500 mL
  Filled 2015-01-03: qty 2.5

## 2015-01-03 MED ORDER — CHLORHEXIDINE GLUCONATE 4 % EX LIQD
60.0000 mL | Freq: Once | CUTANEOUS | Status: AC
  Administered 2015-01-04: 4 via TOPICAL
  Filled 2015-01-03: qty 60

## 2015-01-03 MED ORDER — DEXTROSE 5 % IV SOLN
1.5000 g | INTRAVENOUS | Status: AC
  Administered 2015-01-04: .75 g via INTRAVENOUS
  Administered 2015-01-04: 1.5 g via INTRAVENOUS
  Filled 2015-01-03: qty 1.5

## 2015-01-03 NOTE — Progress Notes (Signed)
ANTICOAGULATION CONSULT NOTE - Follow Up Consult  Pharmacy Consult for heparin Indication: atrial fibrillation and 3V CAD  Allergies  Allergen Reactions  . Clonidine Derivatives     Lowers heart rate  . Plavix [Clopidogrel Bisulfate]     Joint pains  . Exforge [Amlodipine Besylate-Valsartan] Itching and Rash    Patient Measurements: Height: 5' 4.5" (163.8 cm) Weight: 144 lb 1.6 oz (65.363 kg) IBW/kg (Calculated) : 55.85 Heparin Dosing Weight: 65 kg  Vital Signs: Temp: 98 F (36.7 C) (11/24 0444) Temp Source: Oral (11/24 0444) BP: 125/64 mmHg (11/24 0444) Pulse Rate: 62 (11/24 0444)  Labs:  Recent Labs  01/02/15 0550 01/02/15 1049 01/02/15 1847 01/03/15 0343  HGB 14.2  --   --  13.2  HCT 41.8  --   --  40.1  PLT 224  --   --  228  APTT 62* 66*  --   --   HEPARINUNFRC 0.27* 0.25* 0.38 0.58  CREATININE 0.74  --   --   --     Estimated Creatinine Clearance: 54.4 mL/min (by C-G formula based on Cr of 0.74).   Medications:  Infusions:  . heparin 1,050 Units/hr (01/02/15 2205)    Assessment: 74 y/o female on Xarelto PTA for Afib s/p cath with 3V CAD awaiting CABG on Fri. She continues on IV heparin until her CABG. Xarelto is on hold with last dose on 11/20 am.  Heparin level remains therapeutic at 1050 units/hr.  No bleeding noted, CBC is stable.   Goal of Therapy:  Heparin level 0.3-0.7 units/ml Monitor platelets by anticoagulation protocol: Yes   Plan:  -Continue IV heparin at current rate. -Daily heparin level and CBC. -CABG tomorrow 11/25  Drucie Opitz, PharmD Clinical Pharmacy Resident Pager: [phone] 01/03/2015 8:49 AM

## 2015-01-03 NOTE — Progress Notes (Signed)
Patient ID: [name], female   DOB: [birth year], 74 y.o.   MRN: [id number]      [address]       [place] [address]             [phone]                 2 Days Post-Op Procedure(s) (LRB): Left Heart Cath and Coronary Angiography (N/A)  LOS: 2 days   Subjective: No complaints today, visiting with family  Objective: Vital signs in last 24 hours: Patient Vitals for the past 24 hrs:  BP Temp Temp src Pulse Resp SpO2 Weight  01/03/15 0444 125/64 mmHg 98 F (36.7 C) Oral 62 - 96 % 144 lb 1.6 oz (65.363 kg)  01/02/15 2031 (!) 130/50 mmHg 98.5 F (36.9 C) Oral 60 - 95 % -  01/02/15 1731 128/74 mmHg 98.6 F (37 C) Oral 61 18 96 % -    Filed Weights   01/01/15 0602 01/02/15 0450 01/03/15 0444  Weight: 143 lb (64.864 kg) 143 lb 3.2 oz (64.955 kg) 144 lb 1.6 oz (65.363 kg)    Hemodynamic parameters for last 24 hours:    Intake/Output from previous day: 11/23 0701 - 11/24 0700 In: 1040 [P.O.:1040] Out: -  Intake/Output this shift: Total I/O In: 260 [P.O.:260] Out: -   Scheduled Meds: . amLODipine  5 mg Oral Daily  . aspirin  81 mg Oral Daily  . citalopram  20 mg Oral Daily  . losartan  100 mg Oral Daily  . metoprolol succinate  100 mg Oral Daily  . nitroGLYCERIN  0.3 mg Transdermal Daily  . sodium chloride  3 mL Intravenous Q12H   Continuous Infusions: . heparin 1,050 Units/hr (01/02/15 2205)   PRN Meds:.sodium chloride, acetaminophen, ondansetron (ZOFRAN) IV, sodium chloride  General appearance: alert and cooperative Neurologic: intact Heart: regular rate and rhythm, S1, S2 normal, no murmur, click, rub or gallop Lungs: clear to auscultation bilaterally Abdomen: soft, non-tender; bowel sounds normal; no masses,  no organomegaly Extremities: extremities normal, atraumatic, no cyanosis or edema and Homans sign is negative, no sign of DVT  Lab Results: CBC: Recent Labs  01/02/15 0550 01/03/15 0343  WBC 4.6 5.2  HGB 14.2 13.2  HCT 41.8  40.1  PLT 224 228   BMET:  Recent Labs  01/02/15 0550  NA 140  K 5.0  CL 105  CO2 30  GLUCOSE 122*  BUN 5*  CREATININE 0.74  CALCIUM 10.3    PT/INR: No results for input(s): LABPROT, INR in the last 72 hours.   Radiology Dg Chest 2 View  01/02/2015  CLINICAL DATA:  Preop EXAM: CHEST  2 VIEW COMPARISON:  06/03/2012 FINDINGS: Cardiomediastinal silhouette is stable. Dual lead cardiac pacemaker is unchanged in position. No acute infiltrate or pleural effusion. No pulmonary edema. Bony thorax is unremarkable. IMPRESSION: No active cardiopulmonary disease. Electronically Signed   By: Lahoma Crocker M.D.   On: 01/02/2015 08:43   ECHO: 01/02/2015 No MR, EF  65% Study Conclusions  - Left ventricle: The cavity size was normal. Systolic function was normal. The estimated ejection fraction was in the range of 60% to 65%. Wall motion was normal; there were no regional wall motion abnormalities. - Aortic valve: There was mild regurgitation. - Atrial septum: No defect or patent foramen ovale was identified.   Assessment/Plan: S/P Procedure(s) (LRB): Left Heart Cath and Coronary Angiography (N/A) Again reviewed with patient and family plan to proceed with CABG,  poss placement of atrial clip in am  The goals risks and alternatives of the planned surgical procedure CABG, poss placement of left atrial clip  have been discussed with the patient in detail. The risks of the procedure including death, infection, stroke, myocardial infarction, bleeding, blood transfusion have all been discussed specifically.  I have quoted [name] a 2 % of perioperative mortality and a complication rate as high as 35 %. The patient's questions have been answered.[name] is willing  to proceed with the planned procedure. In addition to other potential risks and complications from the surgery, I have made the patient aware of the recent [facility] [place] concerning the risk of infection by  Myocobacterium chimaera related to the use of [employer] 3T heater-cooler equipment during cardiac surgery. I discussed with the patient the low risk of infection, as well as our compliance with the most current FDA recommendations to minimize infection and testing of all devices for contamination. The patient has been made aware of the limited alternatives to immediately replacing the current equipment. The patient has been informed regarding the risks associated with waiting to proceed with needed surgery and that such risks are greater than the risk of infection related to the use of the heater-cooler device. I did make the patient aware that after careful review of the patients having cardiac surgery at [facility] we have no evidence that heater/cooler related infections have occurred at [facility]. We discussed that this is a slow-growing bacterium, such that it can take some period of time for symptoms to develop.   Grace Isaac MD 01/03/2015 9:45 AM

## 2015-01-03 NOTE — Progress Notes (Signed)
       Patient Name: [name] Date of Encounter: 01/03/2015    SUBJECTIVE: Chest discomfort overnight. Some difficulty with right arm IV site.  TELEMETRY:  Normal sinus rhythm Filed Vitals:   01/02/15 0450 01/02/15 1731 01/02/15 2031 01/03/15 0444  BP: 145/68 128/74 130/50 125/64  Pulse: 61 61 60 62  Temp: 98 F (36.7 C) 98.6 F (37 C) 98.5 F (36.9 C) 98 F (36.7 C)  TempSrc: Oral Oral Oral Oral  Resp:  18    Height:      Weight: 143 lb 3.2 oz (64.955 kg)   144 lb 1.6 oz (65.363 kg)  SpO2: 98% 96% 95% 96%    Intake/Output Summary (Last 24 hours) at 01/03/15 0630 Last data filed at 01/02/15 1900  Gross per 24 hour  Intake   1040 ml  Output      0 ml  Net   1040 ml   LABS: Basic Metabolic Panel:  Recent Labs  01/02/15 0550  NA 140  K 5.0  CL 105  CO2 30  GLUCOSE 122*  BUN 5*  CREATININE 0.74  CALCIUM 10.3   CBC:  Recent Labs  01/02/15 0550 01/03/15 0343  WBC 4.6 5.2  HGB 14.2 13.2  HCT 41.8 40.1  MCV 93.7 94.6  PLT 224 228   Echocardiogram: 01/02/2015 Study Conclusions  - Left ventricle: The cavity size was normal. Systolic function was normal. The estimated ejection fraction was in the range of 60% to 65%. Wall motion was normal; there were no regional wall motion abnormalities. - Aortic valve: There was mild regurgitation. - Atrial septum: No defect or patent foramen ovale was identified.  Carotid and lower extremity Doppler studies: Carotid study without any significant obstruction. Lower extremity Doppler studies revealed wide patency without significant flow abnormality.  Radiology/Studies:  No acute abnormality noted  Physical Exam: Blood pressure 125/64, pulse 62, temperature 98 F (36.7 C), temperature source Oral, resp. rate 18, height 5' 4.5" (1.638 m), weight 144 lb 1.6 oz (65.363 kg), SpO2 96 %. Weight change: 14.4 oz (0.408 kg)  Wt Readings from Last 3 Encounters:  01/03/15 144 lb 1.6 oz (65.363 kg)  12/28/14  143 lb 14.4 oz (65.273 kg)  11/27/14 144 lb (65.318 kg)    Chest is clear and without dullness to percussion Cardiac exam reveals no murmur or gallop Extremities reveal no edema  ASSESSMENT:  1. Severe three-vessel CAD 2. Normal left ventricular systolic function 3. Known PAD with recent lower extremity Doppler study revealing three-vessel runoff and no significant flow/perfusion abnormality. 4. Sick sinus syndrome with normally functioning DDD pacemaker  Plan:  1. Continue topical nitrates 2. IV heparin 3. Anticipate coronary grafting by Dr. Servando Snare in a.m.    Demetrios Isaacs 01/03/2015, 6:30 AM

## 2015-01-03 NOTE — Anesthesia Preprocedure Evaluation (Addendum)
Anesthesia Evaluation  Patient identified by MRN, date of birth, ID band Patient awake    Reviewed: Allergy & Precautions, Patient's Chart, lab work & pertinent test results  History of Anesthesia Complications Negative for: history of anesthetic complications  Airway Mallampati: II  TM Distance: >3 FB Neck ROM: Full    Dental  (+) Teeth Intact, Dental Advisory Given   Pulmonary former smoker,    Pulmonary exam normal        Cardiovascular hypertension, + angina + CAD and + Peripheral Vascular Disease  Normal cardiovascular exam+ pacemaker   Study Conclusions  - Left ventricle: The cavity size was normal. Systolic function was normal. The estimated ejection fraction was in the range of 60% to 65%. Wall motion was normal; there were no regional wall motion abnormalities. - Aortic valve: There was mild regurgitation. - Atrial septum: No defect or patent foramen ovale was identified.     Neuro/Psych PSYCHIATRIC DISORDERS Anxiety Depression negative neurological ROS     GI/Hepatic negative GI ROS, Neg liver ROS,   Endo/Other    Renal/GU negative Renal ROS     Musculoskeletal   Abdominal   Peds  Hematology   Anesthesia Other Findings   Reproductive/Obstetrics                          Anesthesia Physical Anesthesia Plan  ASA: IV  Anesthesia Plan: General   Post-op Pain Management:    Induction: Intravenous  Airway Management Planned: Oral ETT  Additional Equipment:   Intra-op Plan:   Post-operative Plan: Post-operative intubation/ventilation  Informed Consent: I have reviewed the patients History and Physical, chart, labs and discussed the procedure including the risks, benefits and alternatives for the proposed anesthesia with the patient or authorized representative who has indicated his/her understanding and acceptance.   Dental advisory given  Plan Discussed with:  CRNA, Anesthesiologist and Surgeon  Anesthesia Plan Comments:        Anesthesia Quick Evaluation

## 2015-01-04 ENCOUNTER — Inpatient Hospital Stay (HOSPITAL_COMMUNITY): Payer: Commercial Managed Care - HMO | Admitting: Certified Registered Nurse Anesthetist

## 2015-01-04 ENCOUNTER — Other Ambulatory Visit: Payer: Self-pay

## 2015-01-04 ENCOUNTER — Inpatient Hospital Stay (HOSPITAL_COMMUNITY): Payer: Commercial Managed Care - HMO

## 2015-01-04 ENCOUNTER — Encounter (HOSPITAL_COMMUNITY)
Admission: AD | Disposition: A | Discharge status: Home Health | Payer: Commercial Managed Care - HMO | Source: Ambulatory Visit | Attending: Cardiothoracic Surgery

## 2015-01-04 ENCOUNTER — Inpatient Hospital Stay (HOSPITAL_COMMUNITY)
Admission: AD | Admit: 2015-01-04 | Discharge: 2015-01-04 | Disposition: A | Discharge status: Home / Self Care | Payer: Commercial Managed Care - HMO | Source: Ambulatory Visit | Attending: Cardiothoracic Surgery | Admitting: Cardiothoracic Surgery

## 2015-01-04 DIAGNOSIS — I2510 Atherosclerotic heart disease of native coronary artery without angina pectoris: Secondary | ICD-10-CM | POA: Diagnosis present

## 2015-01-04 HISTORY — PX: TEE WITHOUT CARDIOVERSION: SHX5443

## 2015-01-04 HISTORY — PX: CORONARY ARTERY BYPASS GRAFT: SHX141

## 2015-01-04 HISTORY — PX: CLIPPING OF ATRIAL APPENDAGE: SHX5773

## 2015-01-04 LAB — POCT I-STAT 3, ART BLOOD GAS (G3+)
Acid-Base Excess: 1 mmol/L (ref 0.0–2.0)
Acid-Base Excess: 1 mmol/L (ref 0.0–2.0)
Acid-Base Excess: 2 mmol/L (ref 0.0–2.0)
Acid-base deficit: 3 mmol/L — ABNORMAL HIGH (ref 0.0–2.0)
Bicarbonate: 25.6 mEq/L — ABNORMAL HIGH (ref 20.0–24.0)
Bicarbonate: 24.4 mEq/L — ABNORMAL HIGH (ref 20.0–24.0)
Bicarbonate: 22.3 mEq/L (ref 20.0–24.0)
Bicarbonate: 22.6 mEq/L (ref 20.0–24.0)
O2 Saturation: 100 %
O2 Saturation: 94 %
O2 Saturation: 98 %
O2 Saturation: 96 %
Patient temperature: 35.6
Patient temperature: 37.3
TCO2: 27 mmol/L (ref 0–100)
TCO2: 25 mmol/L (ref 0–100)
TCO2: 23 mmol/L (ref 0–100)
TCO2: 24 mmol/L (ref 0–100)
pCO2 arterial: 37.6 mmHg (ref 35.0–45.0)
pCO2 arterial: 32 mmHg — ABNORMAL LOW (ref 35.0–45.0)
pCO2 arterial: 23.4 mmHg — ABNORMAL LOW (ref 35.0–45.0)
pCO2 arterial: 41.7 mmHg (ref 35.0–45.0)
pH, Arterial: 7.442 (ref 7.350–7.450)
pH, Arterial: 7.486 — ABNORMAL HIGH (ref 7.350–7.450)
pH, Arterial: 7.587 — ABNORMAL HIGH (ref 7.350–7.450)
pH, Arterial: 7.344 — ABNORMAL LOW (ref 7.350–7.450)
pO2, Arterial: 312 mmHg — ABNORMAL HIGH (ref 80.0–100.0)
pO2, Arterial: 62 mmHg — ABNORMAL LOW (ref 80.0–100.0)
pO2, Arterial: 89 mmHg (ref 80.0–100.0)
pO2, Arterial: 89 mmHg (ref 80.0–100.0)

## 2015-01-04 LAB — URINALYSIS, ROUTINE W REFLEX MICROSCOPIC
Bilirubin Urine: NEGATIVE
Glucose, UA: NEGATIVE mg/dL
Hgb urine dipstick: NEGATIVE
Ketones, ur: NEGATIVE mg/dL
Nitrite: NEGATIVE
Protein, ur: NEGATIVE mg/dL
Specific Gravity, Urine: 1.012 (ref 1.005–1.030)
pH: 6.5 (ref 5.0–8.0)

## 2015-01-04 LAB — CBC
HCT: 38.1 % (ref 36.0–46.0)
HCT: 35.3 % — ABNORMAL LOW (ref 36.0–46.0)
HCT: 36.5 % (ref 36.0–46.0)
Hemoglobin: 12.9 g/dL (ref 12.0–15.0)
Hemoglobin: 11.8 g/dL — ABNORMAL LOW (ref 12.0–15.0)
Hemoglobin: 12.3 g/dL (ref 12.0–15.0)
MCH: 31.8 pg (ref 26.0–34.0)
MCH: 31.1 pg (ref 26.0–34.0)
MCH: 31.5 pg (ref 26.0–34.0)
MCHC: 33.9 g/dL (ref 30.0–36.0)
MCHC: 33.4 g/dL (ref 30.0–36.0)
MCHC: 33.7 g/dL (ref 30.0–36.0)
MCV: 93.8 fL (ref 78.0–100.0)
MCV: 93.1 fL (ref 78.0–100.0)
MCV: 93.4 fL (ref 78.0–100.0)
Platelets: 232 10*3/uL (ref 150–400)
Platelets: 124 10*3/uL — ABNORMAL LOW (ref 150–400)
Platelets: 154 10*3/uL (ref 150–400)
RBC: 4.06 MIL/uL (ref 3.87–5.11)
RBC: 3.79 MIL/uL — ABNORMAL LOW (ref 3.87–5.11)
RBC: 3.91 MIL/uL (ref 3.87–5.11)
RDW: 12.6 % (ref 11.5–15.5)
RDW: 12.5 % (ref 11.5–15.5)
RDW: 12.7 % (ref 11.5–15.5)
WBC: 4.2 10*3/uL (ref 4.0–10.5)
WBC: 4.6 10*3/uL (ref 4.0–10.5)
WBC: 9.9 10*3/uL (ref 4.0–10.5)

## 2015-01-04 LAB — POCT I-STAT, CHEM 8
BUN: 6 mg/dL (ref 6–20)
BUN: 5 mg/dL — ABNORMAL LOW (ref 6–20)
BUN: 4 mg/dL — ABNORMAL LOW (ref 6–20)
BUN: 4 mg/dL — ABNORMAL LOW (ref 6–20)
BUN: 4 mg/dL — ABNORMAL LOW (ref 6–20)
BUN: 4 mg/dL — ABNORMAL LOW (ref 6–20)
Calcium, Ion: 1.32 mmol/L — ABNORMAL HIGH (ref 1.13–1.30)
Calcium, Ion: 1.4 mmol/L — ABNORMAL HIGH (ref 1.13–1.30)
Calcium, Ion: 1.04 mmol/L — ABNORMAL LOW (ref 1.13–1.30)
Calcium, Ion: 1.14 mmol/L (ref 1.13–1.30)
Calcium, Ion: 1.15 mmol/L (ref 1.13–1.30)
Calcium, Ion: 1.18 mmol/L (ref 1.13–1.30)
Chloride: 105 mmol/L (ref 101–111)
Chloride: 105 mmol/L (ref 101–111)
Chloride: 97 mmol/L — ABNORMAL LOW (ref 101–111)
Chloride: 102 mmol/L (ref 101–111)
Chloride: 102 mmol/L (ref 101–111)
Chloride: 109 mmol/L (ref 101–111)
Creatinine, Ser: 0.4 mg/dL — ABNORMAL LOW (ref 0.44–1.00)
Creatinine, Ser: 0.4 mg/dL — ABNORMAL LOW (ref 0.44–1.00)
Creatinine, Ser: 0.4 mg/dL — ABNORMAL LOW (ref 0.44–1.00)
Creatinine, Ser: 0.5 mg/dL (ref 0.44–1.00)
Creatinine, Ser: 0.5 mg/dL (ref 0.44–1.00)
Creatinine, Ser: 0.6 mg/dL (ref 0.44–1.00)
Glucose, Bld: 104 mg/dL — ABNORMAL HIGH (ref 65–99)
Glucose, Bld: 136 mg/dL — ABNORMAL HIGH (ref 65–99)
Glucose, Bld: 117 mg/dL — ABNORMAL HIGH (ref 65–99)
Glucose, Bld: 123 mg/dL — ABNORMAL HIGH (ref 65–99)
Glucose, Bld: 130 mg/dL — ABNORMAL HIGH (ref 65–99)
Glucose, Bld: 161 mg/dL — ABNORMAL HIGH (ref 65–99)
HCT: 35 % — ABNORMAL LOW (ref 36.0–46.0)
HCT: 35 % — ABNORMAL LOW (ref 36.0–46.0)
HCT: 25 % — ABNORMAL LOW (ref 36.0–46.0)
HCT: 27 % — ABNORMAL LOW (ref 36.0–46.0)
HCT: 26 % — ABNORMAL LOW (ref 36.0–46.0)
HCT: 38 % (ref 36.0–46.0)
Hemoglobin: 11.9 g/dL — ABNORMAL LOW (ref 12.0–15.0)
Hemoglobin: 11.9 g/dL — ABNORMAL LOW (ref 12.0–15.0)
Hemoglobin: 8.5 g/dL — ABNORMAL LOW (ref 12.0–15.0)
Hemoglobin: 9.2 g/dL — ABNORMAL LOW (ref 12.0–15.0)
Hemoglobin: 8.8 g/dL — ABNORMAL LOW (ref 12.0–15.0)
Hemoglobin: 12.9 g/dL (ref 12.0–15.0)
Potassium: 3.8 mmol/L (ref 3.5–5.1)
Potassium: 4 mmol/L (ref 3.5–5.1)
Potassium: 4.3 mmol/L (ref 3.5–5.1)
Potassium: 5.1 mmol/L (ref 3.5–5.1)
Potassium: 4.4 mmol/L (ref 3.5–5.1)
Potassium: 4.4 mmol/L (ref 3.5–5.1)
Sodium: 139 mmol/L (ref 135–145)
Sodium: 138 mmol/L (ref 135–145)
Sodium: 134 mmol/L — ABNORMAL LOW (ref 135–145)
Sodium: 138 mmol/L (ref 135–145)
Sodium: 138 mmol/L (ref 135–145)
Sodium: 140 mmol/L (ref 135–145)
TCO2: 28 mmol/L (ref 0–100)
TCO2: 29 mmol/L (ref 0–100)
TCO2: 25 mmol/L (ref 0–100)
TCO2: 25 mmol/L (ref 0–100)
TCO2: 25 mmol/L (ref 0–100)
TCO2: 20 mmol/L (ref 0–100)

## 2015-01-04 LAB — GLUCOSE, CAPILLARY
Glucose-Capillary: 128 mg/dL — ABNORMAL HIGH (ref 65–99)
Glucose-Capillary: 124 mg/dL — ABNORMAL HIGH (ref 65–99)
Glucose-Capillary: 108 mg/dL — ABNORMAL HIGH (ref 65–99)
Glucose-Capillary: 120 mg/dL — ABNORMAL HIGH (ref 65–99)
Glucose-Capillary: 142 mg/dL — ABNORMAL HIGH (ref 65–99)
Glucose-Capillary: 240 mg/dL — ABNORMAL HIGH (ref 65–99)
Glucose-Capillary: 139 mg/dL — ABNORMAL HIGH (ref 65–99)
Glucose-Capillary: 172 mg/dL — ABNORMAL HIGH (ref 65–99)
Glucose-Capillary: 147 mg/dL — ABNORMAL HIGH (ref 65–99)
Glucose-Capillary: 118 mg/dL — ABNORMAL HIGH (ref 65–99)
Glucose-Capillary: 97 mg/dL (ref 65–99)

## 2015-01-04 LAB — PROTIME-INR
INR: 1.37 (ref 0.00–1.49)
Prothrombin Time: 17 seconds — ABNORMAL HIGH (ref 11.6–15.2)

## 2015-01-04 LAB — URINE MICROSCOPIC-ADD ON
Bacteria, UA: NONE SEEN
RBC / HPF: NONE SEEN RBC/hpf (ref 0–5)

## 2015-01-04 LAB — BASIC METABOLIC PANEL
Anion gap: 5 (ref 5–15)
BUN: 7 mg/dL (ref 6–20)
CO2: 27 mmol/L (ref 22–32)
Calcium: 10.2 mg/dL (ref 8.9–10.3)
Chloride: 105 mmol/L (ref 101–111)
Creatinine, Ser: 0.68 mg/dL (ref 0.44–1.00)
GFR calc Af Amer: >60 mL/min (ref >60)
GFR calc non Af Amer: >60 mL/min (ref >60)
Glucose, Bld: 119 mg/dL — ABNORMAL HIGH (ref 65–99)
Potassium: 3.9 mmol/L (ref 3.5–5.1)
Sodium: 137 mmol/L (ref 135–145)

## 2015-01-04 LAB — BLOOD GAS, ARTERIAL
Acid-Base Excess: 0.4 mmol/L (ref 0.0–2.0)
Bicarbonate: 24.7 mEq/L — ABNORMAL HIGH (ref 20.0–24.0)
Drawn by: 345601
FIO2: 0.21
O2 Saturation: 94.7 %
Patient temperature: 98.6
TCO2: 25.9 mmol/L (ref 0–100)
pCO2 arterial: 40.8 mmHg (ref 35.0–45.0)
pH, Arterial: 7.398 (ref 7.350–7.450)
pO2, Arterial: 74.9 mmHg — ABNORMAL LOW (ref 80.0–100.0)

## 2015-01-04 LAB — POCT I-STAT 4, (NA,K, GLUC, HGB,HCT)
Glucose, Bld: 121 mg/dL — ABNORMAL HIGH (ref 65–99)
HCT: 33 % — ABNORMAL LOW (ref 36.0–46.0)
Hemoglobin: 11.2 g/dL — ABNORMAL LOW (ref 12.0–15.0)
Potassium: 3.4 mmol/L — ABNORMAL LOW (ref 3.5–5.1)
Sodium: 140 mmol/L (ref 135–145)

## 2015-01-04 LAB — SURGICAL PCR SCREEN
MRSA, PCR: NEGATIVE
Staphylococcus aureus: NEGATIVE

## 2015-01-04 LAB — CREATININE, SERUM
Creatinine, Ser: 0.77 mg/dL (ref 0.44–1.00)
GFR calc Af Amer: >60 mL/min (ref >60)
GFR calc non Af Amer: >60 mL/min (ref >60)

## 2015-01-04 LAB — MAGNESIUM: Magnesium: 2.8 mg/dL — ABNORMAL HIGH (ref 1.7–2.4)

## 2015-01-04 LAB — PLATELET COUNT: Platelets: 126 10*3/uL — ABNORMAL LOW (ref 150–400)

## 2015-01-04 LAB — HEPARIN LEVEL (UNFRACTIONATED): Heparin Unfractionated: 0.59 IU/mL (ref 0.30–0.70)

## 2015-01-04 LAB — HEMOGLOBIN AND HEMATOCRIT, BLOOD
HCT: 27.4 % — ABNORMAL LOW (ref 36.0–46.0)
Hemoglobin: 9.4 g/dL — ABNORMAL LOW (ref 12.0–15.0)

## 2015-01-04 LAB — APTT: aPTT: 33 seconds (ref 24–37)

## 2015-01-04 SURGERY — CORONARY ARTERY BYPASS GRAFTING (CABG)
Anesthesia: General | Site: Chest

## 2015-01-04 MED ORDER — ALBUMIN HUMAN 5 % IV SOLN
250.0000 mL | INTRAVENOUS | Status: AC | PRN
  Administered 2015-01-04 (×3): 250 mL via INTRAVENOUS
  Filled 2015-01-04 (×2): qty 250

## 2015-01-04 MED ORDER — PROPOFOL 10 MG/ML IV BOLUS
INTRAVENOUS | Status: DC | PRN
  Administered 2015-01-04: 10 mg via INTRAVENOUS
  Administered 2015-01-04: 30 mg via INTRAVENOUS
  Administered 2015-01-04: 20 mg via INTRAVENOUS
  Administered 2015-01-04: 140 mg via INTRAVENOUS

## 2015-01-04 MED ORDER — MORPHINE SULFATE (PF) 2 MG/ML IV SOLN
2.0000 mg | INTRAVENOUS | Status: DC | PRN
  Administered 2015-01-04 – 2015-01-05 (×5): 2 mg via INTRAVENOUS
  Filled 2015-01-04 (×5): qty 1

## 2015-01-04 MED ORDER — METOPROLOL TARTRATE 1 MG/ML IV SOLN: 2.5000 mg | INTRAVENOUS | Status: DC | PRN

## 2015-01-04 MED ORDER — SODIUM CHLORIDE 0.45 % IV SOLN
INTRAVENOUS | Status: DC | PRN
  Administered 2015-01-06: 17:00:00 via INTRAVENOUS

## 2015-01-04 MED ORDER — MIDAZOLAM HCL 10 MG/2ML IJ SOLN
INTRAMUSCULAR | Status: AC
  Filled 2015-01-04: qty 2

## 2015-01-04 MED ORDER — BISACODYL 10 MG RE SUPP
10.0000 mg | Freq: Every day | RECTAL | Status: DC
  Filled 2015-01-04: qty 1

## 2015-01-04 MED ORDER — ROCURONIUM BROMIDE 50 MG/5ML IV SOLN
INTRAVENOUS | Status: AC
  Filled 2015-01-04: qty 2

## 2015-01-04 MED ORDER — HEPARIN SODIUM (PORCINE) 1000 UNIT/ML IJ SOLN
INTRAMUSCULAR | Status: AC
  Filled 2015-01-04: qty 1

## 2015-01-04 MED ORDER — ROCURONIUM BROMIDE 100 MG/10ML IV SOLN
INTRAVENOUS | Status: DC | PRN
  Administered 2015-01-04: 10 mg via INTRAVENOUS
  Administered 2015-01-04: 100 mg via INTRAVENOUS
  Administered 2015-01-04: 20 mg via INTRAVENOUS
  Administered 2015-01-04: 30 mg via INTRAVENOUS

## 2015-01-04 MED ORDER — PHENYLEPHRINE HCL 10 MG/ML IJ SOLN
INTRAMUSCULAR | Status: DC | PRN
  Administered 2015-01-04: 40 ug via INTRAVENOUS
  Administered 2015-01-04: 20 ug via INTRAVENOUS

## 2015-01-04 MED ORDER — PROTAMINE SULFATE 10 MG/ML IV SOLN
INTRAVENOUS | Status: AC
  Filled 2015-01-04: qty 25

## 2015-01-04 MED ORDER — 0.9 % SODIUM CHLORIDE (POUR BTL) OPTIME
TOPICAL | Status: DC | PRN
  Administered 2015-01-04: 5000 mL

## 2015-01-04 MED ORDER — ACETAMINOPHEN 160 MG/5ML PO SOLN: 650.0000 mg | Freq: Once | ORAL | Status: AC

## 2015-01-04 MED ORDER — SIMVASTATIN 20 MG PO TABS
20.0000 mg | ORAL_TABLET | Freq: Every day | ORAL | Status: DC
  Filled 2015-01-04 (×2): qty 1

## 2015-01-04 MED ORDER — ARTIFICIAL TEARS OP OINT
TOPICAL_OINTMENT | OPHTHALMIC | Status: DC | PRN
  Administered 2015-01-04: 1 via OPHTHALMIC

## 2015-01-04 MED ORDER — EPHEDRINE SULFATE 50 MG/ML IJ SOLN
INTRAMUSCULAR | Status: AC
  Filled 2015-01-04: qty 1

## 2015-01-04 MED ORDER — PHENYLEPHRINE HCL 10 MG/ML IJ SOLN
10.0000 mg | INTRAVENOUS | Status: DC | PRN
  Administered 2015-01-04: 20 ug/min via INTRAVENOUS

## 2015-01-04 MED ORDER — LACTATED RINGERS IV SOLN: 500.0000 mL | Freq: Once | INTRAVENOUS | Status: DC | PRN

## 2015-01-04 MED ORDER — ONDANSETRON HCL 4 MG/2ML IJ SOLN
INTRAMUSCULAR | Status: AC
  Filled 2015-01-04: qty 2

## 2015-01-04 MED ORDER — FENTANYL CITRATE (PF) 250 MCG/5ML IJ SOLN
INTRAMUSCULAR | Status: AC
  Filled 2015-01-04: qty 5

## 2015-01-04 MED ORDER — LACTATED RINGERS IV SOLN
INTRAVENOUS | Status: DC
  Administered 2015-01-04: 13:00:00 via INTRAVENOUS

## 2015-01-04 MED ORDER — DOCUSATE SODIUM 100 MG PO CAPS
200.0000 mg | ORAL_CAPSULE | Freq: Every day | ORAL | Status: DC
Start: 2015-01-05 — End: 2015-01-14
  Administered 2015-01-05 – 2015-01-14 (×9): 200 mg via ORAL
  Filled 2015-01-04 (×10): qty 2

## 2015-01-04 MED ORDER — LIDOCAINE HCL (CARDIAC) 20 MG/ML IV SOLN
INTRAVENOUS | Status: AC
  Filled 2015-01-04: qty 10

## 2015-01-04 MED ORDER — HEPARIN SODIUM (PORCINE) 1000 UNIT/ML IJ SOLN
INTRAMUSCULAR | Status: DC | PRN
  Administered 2015-01-04: 22000 [IU] via INTRAVENOUS

## 2015-01-04 MED ORDER — DEXTROSE 5 % IV SOLN
1.5000 g | Freq: Two times a day (BID) | INTRAVENOUS | Status: AC
  Administered 2015-01-04 – 2015-01-06 (×4): 1.5 g via INTRAVENOUS
  Filled 2015-01-04 (×4): qty 1.5

## 2015-01-04 MED ORDER — HEMOSTATIC AGENTS (NO CHARGE) OPTIME
TOPICAL | Status: DC | PRN
  Administered 2015-01-04: 1 via TOPICAL

## 2015-01-04 MED ORDER — INSULIN REGULAR BOLUS VIA INFUSION
0.0000 [IU] | Freq: Three times a day (TID) | INTRAVENOUS | Status: DC
  Filled 2015-01-04: qty 10

## 2015-01-04 MED ORDER — ASPIRIN 81 MG PO CHEW: 324.0000 mg | CHEWABLE_TABLET | Freq: Every day | ORAL | Status: DC

## 2015-01-04 MED ORDER — LACTATED RINGERS IV SOLN
INTRAVENOUS | Status: DC | PRN
  Administered 2015-01-04 (×2): via INTRAVENOUS

## 2015-01-04 MED ORDER — PROPOFOL 10 MG/ML IV BOLUS
INTRAVENOUS | Status: AC
  Filled 2015-01-04: qty 20

## 2015-01-04 MED ORDER — LIDOCAINE HCL (CARDIAC) 20 MG/ML IV SOLN
INTRAVENOUS | Status: DC | PRN
  Administered 2015-01-04: 100 mg via INTRAVENOUS

## 2015-01-04 MED ORDER — MORPHINE SULFATE (PF) 2 MG/ML IV SOLN: 1.0000 mg | INTRAVENOUS | Status: AC | PRN

## 2015-01-04 MED ORDER — METOPROLOL TARTRATE 12.5 MG HALF TABLET
12.5000 mg | ORAL_TABLET | Freq: Two times a day (BID) | ORAL | Status: DC
  Administered 2015-01-05 – 2015-01-10 (×9): 12.5 mg via ORAL
  Filled 2015-01-04 (×14): qty 1

## 2015-01-04 MED ORDER — TRAMADOL HCL 50 MG PO TABS
50.0000 mg | ORAL_TABLET | ORAL | Status: DC | PRN
  Administered 2015-01-05 (×2): 100 mg via ORAL
  Administered 2015-01-09 (×2): 50 mg via ORAL
  Administered 2015-01-12: 100 mg via ORAL
  Filled 2015-01-04: qty 1
  Filled 2015-01-04 (×3): qty 2
  Filled 2015-01-04: qty 1

## 2015-01-04 MED ORDER — LACTATED RINGERS IV SOLN
INTRAVENOUS | Status: DC | PRN
  Administered 2015-01-04: 07:00:00 via INTRAVENOUS

## 2015-01-04 MED ORDER — SUCCINYLCHOLINE CHLORIDE 20 MG/ML IJ SOLN
INTRAMUSCULAR | Status: AC
  Filled 2015-01-04: qty 1

## 2015-01-04 MED ORDER — ACETAMINOPHEN 500 MG PO TABS
1000.0000 mg | ORAL_TABLET | Freq: Four times a day (QID) | ORAL | Status: DC
  Administered 2015-01-05 – 2015-01-07 (×9): 1000 mg via ORAL
  Filled 2015-01-04 (×17): qty 2

## 2015-01-04 MED ORDER — FAMOTIDINE IN NACL 20-0.9 MG/50ML-% IV SOLN
20.0000 mg | Freq: Two times a day (BID) | INTRAVENOUS | Status: AC
  Administered 2015-01-04: 20 mg via INTRAVENOUS

## 2015-01-04 MED ORDER — DOPAMINE-DEXTROSE 3.2-5 MG/ML-% IV SOLN
5.0000 ug/kg/min | INTRAVENOUS | Status: DC
  Administered 2015-01-04: 3 ug/kg/min via INTRAVENOUS
  Administered 2015-01-06: 5 ug/kg/min via INTRAVENOUS
  Filled 2015-01-04: qty 250

## 2015-01-04 MED ORDER — MIDAZOLAM HCL 5 MG/5ML IJ SOLN
INTRAMUSCULAR | Status: DC | PRN
  Administered 2015-01-04: 1 mg via INTRAVENOUS
  Administered 2015-01-04 (×3): 2 mg via INTRAVENOUS
  Administered 2015-01-04: 1 mg via INTRAVENOUS
  Administered 2015-01-04: 2 mg via INTRAVENOUS

## 2015-01-04 MED ORDER — FENTANYL CITRATE (PF) 250 MCG/5ML IJ SOLN
INTRAMUSCULAR | Status: AC
  Filled 2015-01-04: qty 25

## 2015-01-04 MED ORDER — PHENYLEPHRINE 40 MCG/ML (10ML) SYRINGE FOR IV PUSH (FOR BLOOD PRESSURE SUPPORT)
PREFILLED_SYRINGE | INTRAVENOUS | Status: AC
  Filled 2015-01-04: qty 10

## 2015-01-04 MED ORDER — MIDAZOLAM HCL 2 MG/2ML IJ SOLN
2.0000 mg | INTRAMUSCULAR | Status: DC | PRN
  Filled 2015-01-04: qty 2

## 2015-01-04 MED ORDER — VANCOMYCIN HCL IN DEXTROSE 1-5 GM/200ML-% IV SOLN
1000.0000 mg | Freq: Once | INTRAVENOUS | Status: AC
  Administered 2015-01-04: 1000 mg via INTRAVENOUS
  Filled 2015-01-04: qty 200

## 2015-01-04 MED ORDER — OXYCODONE HCL 5 MG PO TABS
5.0000 mg | ORAL_TABLET | ORAL | Status: DC | PRN
  Administered 2015-01-05: 5 mg via ORAL
  Administered 2015-01-05: 10 mg via ORAL
  Administered 2015-01-05: 5 mg via ORAL
  Administered 2015-01-13: 10 mg via ORAL
  Filled 2015-01-04 (×2): qty 1
  Filled 2015-01-04 (×2): qty 2

## 2015-01-04 MED ORDER — SODIUM CHLORIDE 0.9 % IJ SOLN
3.0000 mL | Freq: Two times a day (BID) | INTRAMUSCULAR | Status: DC
  Administered 2015-01-05 – 2015-01-13 (×9): 3 mL via INTRAVENOUS

## 2015-01-04 MED ORDER — PROTAMINE SULFATE 10 MG/ML IV SOLN
INTRAVENOUS | Status: DC | PRN
  Administered 2015-01-04: 40 mg via INTRAVENOUS
  Administered 2015-01-04 (×3): 30 mg via INTRAVENOUS
  Administered 2015-01-04: 20 mg via INTRAVENOUS

## 2015-01-04 MED ORDER — CHLORHEXIDINE GLUCONATE 0.12 % MT SOLN
15.0000 mL | OROMUCOSAL | Status: AC
  Administered 2015-01-04: 15 mL via OROMUCOSAL
  Filled 2015-01-04: qty 15

## 2015-01-04 MED ORDER — BISACODYL 5 MG PO TBEC
10.0000 mg | DELAYED_RELEASE_TABLET | Freq: Every day | ORAL | Status: DC
  Administered 2015-01-05 – 2015-01-14 (×6): 10 mg via ORAL
  Filled 2015-01-04 (×10): qty 2

## 2015-01-04 MED ORDER — ACETAMINOPHEN 650 MG RE SUPP
650.0000 mg | Freq: Once | RECTAL | Status: AC
  Administered 2015-01-04: 650 mg via RECTAL

## 2015-01-04 MED ORDER — DEXMEDETOMIDINE HCL IN NACL 200 MCG/50ML IV SOLN: 0.0000 ug/kg/h | INTRAVENOUS | Status: DC

## 2015-01-04 MED ORDER — SODIUM CHLORIDE 0.9 % IJ SOLN
OROMUCOSAL | Status: DC | PRN
  Administered 2015-01-04 (×3): via TOPICAL

## 2015-01-04 MED ORDER — SODIUM CHLORIDE 0.9 % IV SOLN
INTRAVENOUS | Status: DC
  Administered 2015-01-04: 16:00:00 via INTRAVENOUS
  Filled 2015-01-04 (×2): qty 2.5

## 2015-01-04 MED ORDER — MAGNESIUM SULFATE 4 GM/100ML IV SOLN
4.0000 g | Freq: Once | INTRAVENOUS | Status: AC
  Administered 2015-01-04: 4 g via INTRAVENOUS
  Filled 2015-01-04: qty 100

## 2015-01-04 MED ORDER — SODIUM CHLORIDE 0.9 % IJ SOLN: 3.0000 mL | INTRAMUSCULAR | Status: DC | PRN

## 2015-01-04 MED ORDER — SODIUM CHLORIDE 0.9 % IJ SOLN
INTRAMUSCULAR | Status: AC
  Filled 2015-01-04: qty 10

## 2015-01-04 MED ORDER — NITROGLYCERIN IN D5W 200-5 MCG/ML-% IV SOLN
0.0000 ug/min | INTRAVENOUS | Status: DC
  Administered 2015-01-04: 20 ug/min via INTRAVENOUS

## 2015-01-04 MED ORDER — ANTISEPTIC ORAL RINSE SOLUTION (CORINZ)
7.0000 mL | Freq: Four times a day (QID) | OROMUCOSAL | Status: DC
Start: 2015-01-04 — End: 2015-01-08
  Administered 2015-01-04 – 2015-01-07 (×11): 7 mL via OROMUCOSAL

## 2015-01-04 MED ORDER — PANTOPRAZOLE SODIUM 40 MG PO TBEC
40.0000 mg | DELAYED_RELEASE_TABLET | Freq: Every day | ORAL | Status: DC
Start: 2015-01-06 — End: 2015-01-14
  Administered 2015-01-06 – 2015-01-14 (×9): 40 mg via ORAL
  Filled 2015-01-04 (×9): qty 1

## 2015-01-04 MED ORDER — SODIUM CHLORIDE 0.9 % IV SOLN: 250.0000 mL | INTRAVENOUS | Status: DC

## 2015-01-04 MED ORDER — PHENYLEPHRINE HCL 10 MG/ML IJ SOLN
0.0000 ug/min | INTRAVENOUS | Status: DC
  Administered 2015-01-04: 3 ug/min via INTRAVENOUS
  Filled 2015-01-04: qty 2

## 2015-01-04 MED ORDER — ASPIRIN EC 325 MG PO TBEC
325.0000 mg | DELAYED_RELEASE_TABLET | Freq: Every day | ORAL | Status: DC
  Administered 2015-01-05 – 2015-01-09 (×5): 325 mg via ORAL
  Filled 2015-01-04 (×7): qty 1

## 2015-01-04 MED ORDER — CHLORHEXIDINE GLUCONATE 0.12 % MT SOLN
15.0000 mL | Freq: Two times a day (BID) | OROMUCOSAL | Status: DC
  Administered 2015-01-04 – 2015-01-08 (×7): 15 mL via OROMUCOSAL
  Filled 2015-01-04: qty 15

## 2015-01-04 MED ORDER — SODIUM CHLORIDE 0.9 % IV SOLN
INTRAVENOUS | Status: DC
  Administered 2015-01-04 – 2015-01-06 (×3): via INTRAVENOUS
  Administered 2015-01-07: 20 mL via INTRAVENOUS

## 2015-01-04 MED ORDER — METOPROLOL TARTRATE 25 MG/10 ML ORAL SUSPENSION
12.5000 mg | Freq: Two times a day (BID) | ORAL | Status: DC
  Administered 2015-01-05: 12.5 mg
  Filled 2015-01-04 (×8): qty 5

## 2015-01-04 MED ORDER — ONDANSETRON HCL 4 MG/2ML IJ SOLN
4.0000 mg | Freq: Four times a day (QID) | INTRAMUSCULAR | Status: DC | PRN
  Administered 2015-01-05 – 2015-01-14 (×8): 4 mg via INTRAVENOUS
  Filled 2015-01-04 (×9): qty 2

## 2015-01-04 MED ORDER — POTASSIUM CHLORIDE 10 MEQ/50ML IV SOLN
10.0000 meq | INTRAVENOUS | Status: AC
  Administered 2015-01-04 (×3): 10 meq via INTRAVENOUS

## 2015-01-04 MED ORDER — ACETAMINOPHEN 160 MG/5ML PO SOLN: 1000.0000 mg | Freq: Four times a day (QID) | ORAL | Status: DC

## 2015-01-04 MED ORDER — FENTANYL CITRATE (PF) 100 MCG/2ML IJ SOLN
INTRAMUSCULAR | Status: DC | PRN
  Administered 2015-01-04 (×2): 100 ug via INTRAVENOUS
  Administered 2015-01-04: 150 ug via INTRAVENOUS
  Administered 2015-01-04: 250 ug via INTRAVENOUS
  Administered 2015-01-04: 100 ug via INTRAVENOUS
  Administered 2015-01-04: 250 ug via INTRAVENOUS
  Administered 2015-01-04: 150 ug via INTRAVENOUS
  Administered 2015-01-04: 250 ug via INTRAVENOUS
  Administered 2015-01-04: 100 ug via INTRAVENOUS
  Administered 2015-01-04 (×2): 150 ug via INTRAVENOUS

## 2015-01-04 SURGICAL SUPPLY — 87 items
ATRICLIP EXCLUSION 35 STD HAND (Clip) ×3 IMPLANT
ATRICLIP SELECTION GUIDE ×3 IMPLANT
BAG DECANTER FOR FLEXI CONT (MISCELLANEOUS) ×3 IMPLANT
BANDAGE ELASTIC 4 VELCRO ST LF (GAUZE/BANDAGES/DRESSINGS) ×3 IMPLANT
BANDAGE ELASTIC 6 VELCRO ST LF (GAUZE/BANDAGES/DRESSINGS) ×3 IMPLANT
BLADE STERNUM SYSTEM 6 (BLADE) ×3 IMPLANT
BLADE SURG 11 STRL SS (BLADE) ×3 IMPLANT
BNDG GAUZE ELAST 4 BULKY (GAUZE/BANDAGES/DRESSINGS) ×3 IMPLANT
CANISTER SUCTION 2500CC (MISCELLANEOUS) ×3 IMPLANT
CATH CPB KIT GERHARDT (MISCELLANEOUS) ×3 IMPLANT
CATH THORACIC 28FR (CATHETERS) ×3 IMPLANT
CRADLE DONUT ADULT HEAD (MISCELLANEOUS) ×3 IMPLANT
DERMABOND ADHESIVE PROPEN (GAUZE/BANDAGES/DRESSINGS) ×1
DERMABOND ADVANCED .7 DNX6 (GAUZE/BANDAGES/DRESSINGS) ×2 IMPLANT
DRAIN CHANNEL 28F RND 3/8 FF (WOUND CARE) ×3 IMPLANT
DRAPE CARDIOVASCULAR INCISE (DRAPES) ×1
DRAPE SLUSH/WARMER DISC (DRAPES) ×3 IMPLANT
DRAPE SRG 135X102X78XABS (DRAPES) ×2 IMPLANT
DRSG AQUACEL AG ADV 3.5X14 (GAUZE/BANDAGES/DRESSINGS) ×3 IMPLANT
DRSG COVADERM 4X14 (GAUZE/BANDAGES/DRESSINGS) ×3 IMPLANT
DRSG KUZMA FLUFF (GAUZE/BANDAGES/DRESSINGS) ×3 IMPLANT
ELECT BLADE 4.0 EZ CLEAN MEGAD (MISCELLANEOUS) ×3
ELECT REM PT RETURN 9FT ADLT (ELECTROSURGICAL) ×6
ELECTRODE BLDE 4.0 EZ CLN MEGD (MISCELLANEOUS) ×2 IMPLANT
ELECTRODE REM PT RTRN 9FT ADLT (ELECTROSURGICAL) ×4 IMPLANT
GAUZE SPONGE 4X4 12PLY STRL (GAUZE/BANDAGES/DRESSINGS) ×6 IMPLANT
GLOVE BIO SURGEON STRL SZ 6.5 (GLOVE) ×12 IMPLANT
GOWN STRL REUS W/ TWL LRG LVL3 (GOWN DISPOSABLE) ×8 IMPLANT
GOWN STRL REUS W/TWL LRG LVL3 (GOWN DISPOSABLE) ×4
HEMOSTAT POWDER SURGIFOAM 1G (HEMOSTASIS) ×9 IMPLANT
HEMOSTAT SURGICEL 2X14 (HEMOSTASIS) ×3 IMPLANT
KIT BASIN OR (CUSTOM PROCEDURE TRAY) ×3 IMPLANT
KIT CATH SUCT 8FR (CATHETERS) ×3 IMPLANT
KIT ROOM TURNOVER OR (KITS) ×3 IMPLANT
KIT SUCTION CATH 14FR (SUCTIONS) ×6 IMPLANT
KIT VASOVIEW W/TROCAR VH 2000 (KITS) ×3 IMPLANT
LEAD PACING MYOCARDI (MISCELLANEOUS) ×3 IMPLANT
MARKER GRAFT CORONARY BYPASS (MISCELLANEOUS) ×9 IMPLANT
NS IRRIG 1000ML POUR BTL (IV SOLUTION) ×15 IMPLANT
PACK OPEN HEART (CUSTOM PROCEDURE TRAY) ×3 IMPLANT
PAD ARMBOARD 7.5X6 YLW CONV (MISCELLANEOUS) ×6 IMPLANT
PAD ELECT DEFIB RADIOL ZOLL (MISCELLANEOUS) ×3 IMPLANT
PENCIL BUTTON HOLSTER BLD 10FT (ELECTRODE) ×3 IMPLANT
PUNCH AORTIC ROTATE  4.5MM 8IN (MISCELLANEOUS) ×3 IMPLANT
SET CARDIOPLEGIA MPS 5001102 (MISCELLANEOUS) ×3 IMPLANT
SPONGE GAUZE 4X4 12PLY STER LF (GAUZE/BANDAGES/DRESSINGS) ×6 IMPLANT
SPONGE LAP 18X18 X RAY DECT (DISPOSABLE) ×6 IMPLANT
SUT BONE WAX W31G (SUTURE) ×3 IMPLANT
SUT ETHIBOND 2 0 SH (SUTURE) ×4
SUT ETHIBOND 2 0 SH 36X2 (SUTURE) ×8 IMPLANT
SUT MNCRL AB 4-0 PS2 18 (SUTURE) ×3 IMPLANT
SUT PROLENE 3 0 SH1 36 (SUTURE) ×3 IMPLANT
SUT PROLENE 4 0 RB 1 (SUTURE) ×1
SUT PROLENE 4 0 TF (SUTURE) ×6 IMPLANT
SUT PROLENE 4-0 RB1 .5 CRCL 36 (SUTURE) ×2 IMPLANT
SUT PROLENE 5 0 C 1 36 (SUTURE) ×6 IMPLANT
SUT PROLENE 6 0 C 1 30 (SUTURE) ×6 IMPLANT
SUT PROLENE 6 0 CC (SUTURE) ×6 IMPLANT
SUT PROLENE 7 0 BV1 MDA (SUTURE) ×3 IMPLANT
SUT PROLENE 8 0 BV175 6 (SUTURE) ×9 IMPLANT
SUT SILK  1 MH (SUTURE) ×3
SUT SILK 1 MH (SUTURE) ×6 IMPLANT
SUT SILK 1 TIES 10X30 (SUTURE) ×3 IMPLANT
SUT SILK 2 0 SH CR/8 (SUTURE) ×6 IMPLANT
SUT SILK 2 0 TIES 10X30 (SUTURE) ×3 IMPLANT
SUT SILK 2 0 TIES 17X18 (SUTURE) ×1
SUT SILK 2-0 18XBRD TIE BLK (SUTURE) ×2 IMPLANT
SUT SILK 3 0 SH CR/8 (SUTURE) ×3 IMPLANT
SUT SILK 4 0 TIE 10X30 (SUTURE) ×6 IMPLANT
SUT STEEL 6MS V (SUTURE) ×3 IMPLANT
SUT STEEL SZ 6 DBL 3X14 BALL (SUTURE) ×3 IMPLANT
SUT TEM PAC WIRE 2 0 SH (SUTURE) ×12 IMPLANT
SUT VIC AB 1 CTX 18 (SUTURE) ×6 IMPLANT
SUT VIC AB 2-0 CT1 27 (SUTURE) ×1
SUT VIC AB 2-0 CT1 TAPERPNT 27 (SUTURE) ×2 IMPLANT
SUT VIC AB 2-0 CTX 27 (SUTURE) ×6 IMPLANT
SUT VIC AB 3-0 X1 27 (SUTURE) ×6 IMPLANT
SUTURE E-PAK OPEN HEART (SUTURE) ×3 IMPLANT
SYSTEM SAHARA CHEST DRAIN ATS (WOUND CARE) ×3 IMPLANT
TAPE CLOTH SURG 4X10 WHT LF (GAUZE/BANDAGES/DRESSINGS) ×6 IMPLANT
TAPE PAPER 2X10 WHT MICROPORE (GAUZE/BANDAGES/DRESSINGS) ×3 IMPLANT
TOWEL OR 17X24 6PK STRL BLUE (TOWEL DISPOSABLE) ×6 IMPLANT
TOWEL OR 17X26 10 PK STRL BLUE (TOWEL DISPOSABLE) ×6 IMPLANT
TRAY FOLEY IC TEMP SENS 16FR (CATHETERS) ×3 IMPLANT
TUBING INSUFFLATION (TUBING) ×3 IMPLANT
UNDERPAD 30X30 INCONTINENT (UNDERPADS AND DIAPERS) ×3 IMPLANT
WATER STERILE IRR 1000ML POUR (IV SOLUTION) ×6 IMPLANT

## 2015-01-04 NOTE — Brief Op Note (Addendum)
      [address]       [place] [address]             [phone]     01/04/2015  10:52 AM  PATIENT:  [name]  74 y.o. female  PRE-OPERATIVE DIAGNOSIS:  1. Multivessel CAD  2. History of atrial fibrillation  POST-OPERATIVE DIAGNOSIS:  1. Multivessel CAD  2. History of atrial fibrillation  PROCEDURE: TRANSESOPHAGEAL ECHOCARDIOGRAM (TEE), MEDIAN STERNOTOMY for CORONARY ARTERY BYPASS GRAFTING (CABG) x 3 (LIMA to LAD, SVG to RAMUS INTERMEDIATE, and SVG to PDA) with EVH from right thigh and partial lower leg GREATER SAPHENOUS VEIN, CLIPPING OF ATRIAL APPENDAGE (size 35 mm)  SURGEON:  Surgeon(s) and Role:    * Grace Isaac, MD - Primary  PHYSICIAN ASSISTANT: Lars Pinks PA-C  ANESTHESIA:   general  EBL:  Total I/O In: 500 [I.V.:500] Out: 950 [Urine:950]  DRAINS: Chest tubes placed in the mediastinal and pleural spaces   COUNTS CORRECT:  YES  DICTATION: .Dragon Dictation  PLAN OF CARE: Admit to inpatient   PATIENT DISPOSITION:  ICU - intubated and hemodynamically stable.   Delay start of Pharmacological VTE agent (>24hrs) due to surgical blood loss or risk of bleeding: yes  BASELINE WEIGHT: 64 kg

## 2015-01-04 NOTE — Progress Notes (Signed)
ABG results called and reported to Dr. Servando Snare - he said to give the patient more time and continue with weaning. RT states that the patient is too sleepy to extubate at this time.

## 2015-01-04 NOTE — Anesthesia Postprocedure Evaluation (Signed)
Anesthesia Post Note  Patient: [name]  Procedure(s) Performed: Procedure(s) (LRB): CORONARY ARTERY BYPASS GRAFTING (CABG) x 3 using left internal mammory artery and greater saphenous vein right leg harvested endoscopically. (N/A) TRANSESOPHAGEAL ECHOCARDIOGRAM (TEE) (N/A) CLIPPING OF ATRIAL APPENDAGE (N/A)  Patient location during evaluation: SICU Anesthesia Type: General Level of consciousness: sedated Pain management: pain level controlled Vital Signs Assessment: post-procedure vital signs reviewed and stable Respiratory status: patient remains intubated per anesthesia plan Cardiovascular status: stable Anesthetic complications: no    Last Vitals:  Filed Vitals:   01/04/15 1345 01/04/15 1400  BP: 92/69 88/64  Pulse: 90 90  Temp: 36.2 C 36.5 C  Resp: 12 12    Last Pain:  Filed Vitals:   01/04/15 1405  PainSc: 0-No pain                 Kenesha Moshier DANIEL

## 2015-01-04 NOTE — OR Nursing (Signed)
Retractor-out call to SICU at 1135. Skin-closure call to SICU at 1156.

## 2015-01-04 NOTE — Progress Notes (Signed)
Echocardiogram Echocardiogram Transesophageal has been performed.  Joelene Millin 01/04/2015, 8:30 AM

## 2015-01-04 NOTE — Progress Notes (Signed)
Patient ID: [name], female   DOB: 05/26/1940, 74 y.o.   MRN: [id number] EVENING ROUNDS NOTE :     [address]       [place] [address]             [phone]                 Day of Surgery Procedure(s) (LRB): CORONARY ARTERY BYPASS GRAFTING (CABG) x 3 using left internal mammory artery and greater saphenous vein right leg harvested endoscopically. (N/A) TRANSESOPHAGEAL ECHOCARDIOGRAM (TEE) (N/A) CLIPPING OF ATRIAL APPENDAGE (N/A)  Total Length of Stay:  LOS: 3 days  BP 116/75 mmHg  Pulse 91  Temp(Src) 99 F (37.2 C) (Oral)  Resp 23  Ht 5' 4.5" (1.638 m)  Wt 141 lb 4.8 oz (64.093 kg)  BMI 23.89 kg/m2  SpO2 99%  .Intake/Output      11/24 0701 - 11/25 0700 11/25 0701 - 11/26 0700   P.O. 1280    I.V. (mL/kg) 535.9 (8.4) 3150.1 (49.1)   Blood  570   IV Piggyback  850   Total Intake(mL/kg) 1815.9 (28.3) 4570.1 (71.3)   Urine (mL/kg/hr) 1400 (0.9) 3575 (5.3)   Blood  1600 (2.4)   Chest Tube  90 (0.1)   Total Output 1400 5265   Net +415.9 -694.9          . sodium chloride    . [START ON 01/05/2015] sodium chloride    . sodium chloride 20 mL/hr at 01/04/15 1300  . dexmedetomidine Stopped (01/04/15 1600)  . DOPamine 3 mcg/kg/min (01/04/15 1730)  . insulin (NOVOLIN-R) infusion 1.2 Units/hr (01/04/15 1700)  . lactated ringers 20 mL/hr at 01/04/15 1300  . lactated ringers 20 mL/hr at 01/04/15 1300  . nitroGLYCERIN 10 mcg/min (01/04/15 1500)  . phenylephrine (NEO-SYNEPHRINE) Adult infusion 3 mcg/min (01/04/15 1300)     Lab Results  Component Value Date   WBC 4.6 01/04/2015   HGB 11.8* 01/04/2015   HCT 35.3* 01/04/2015   PLT 124* 01/04/2015   GLUCOSE 121* 01/04/2015   ALT 13 12/28/2014   AST 16 12/28/2014   NA 140 01/04/2015   K 3.4* 01/04/2015   CL 102 01/04/2015   CREATININE 0.50 01/04/2015   BUN 4* 01/04/2015   CO2 27 01/04/2015   INR 1.37 01/04/2015   Sleepy but opens eyes and moves all extremities , not bleeding ci 1.4 low dose  dopamine started   Grace Isaac MD  Beeper [phone] Office [phone] 01/04/2015 5:38 PM

## 2015-01-04 NOTE — Anesthesia Procedure Notes (Addendum)
Procedure Name: Intubation Date/Time: 01/04/2015 7:54 AM Performed by: Merdis Delay Pre-anesthesia Checklist: Patient identified, Timeout performed, Emergency Drugs available, Suction available and Patient being monitored Patient Re-evaluated:Patient Re-evaluated prior to inductionOxygen Delivery Method: Circle system utilized Preoxygenation: Pre-oxygenation with 100% oxygen Intubation Type: IV induction Ventilation: Mask ventilation without difficulty and Oral airway inserted - appropriate to patient size Laryngoscope Size: Mac and 3 Grade View: Grade I Tube type: Oral Tube size: 8.0 mm Number of attempts: 1 Airway Equipment and Method: Stylet and LTA kit utilized Placement Confirmation: ETT inserted through vocal cords under direct vision,  breath sounds checked- equal and bilateral,  positive ETCO2 and CO2 detector Secured at: 22 cm Tube secured with: Tape Dental Injury: Teeth and Oropharynx as per pre-operative assessment

## 2015-01-04 NOTE — Procedures (Signed)
Extubation Procedure Note  Patient Details:   Name: [name] DOB: [birth year] MRN: [id number]   Airway Documentation:     Evaluation  O2 sats: stable throughout Complications: No apparent complications Patient did tolerate procedure well. Bilateral Breath Sounds: Clear, Diminished   Yes   Pt. Was extubated to a 4L [place] without any complications, dyspnea or stridor noted. Pt. Achieved a goal of 1.3L on VC & -40 on NIF. Pt. Was instructed on IS x 5, highest goal achieved was 739mL. ABG values weren't in normal rage but Dr. Servando Snare was made aware. He was also made aware that pt. Had an audible air leak & gave me a verbal order to proceed with extubation. Pt. Is doing well at this time & all vitals are within normal limits.  Claretta Fraise 01/04/2015, 6:52 PM

## 2015-01-04 NOTE — Transfer of Care (Signed)
Immediate Anesthesia Transfer of Care Note  Patient: [name]  Procedure(s) Performed: Procedure(s): CORONARY ARTERY BYPASS GRAFTING (CABG) x 3 using left internal mammory artery and greater saphenous vein right leg harvested endoscopically. (N/A) TRANSESOPHAGEAL ECHOCARDIOGRAM (TEE) (N/A) CLIPPING OF ATRIAL APPENDAGE (N/A)  Patient Location: SICU  Anesthesia Type:General  Level of Consciousness: sedated and Patient remains intubated per anesthesia plan  Airway & Oxygen Therapy: Patient remains intubated per anesthesia plan and Patient placed on Ventilator (see vital sign flow sheet for setting)  Post-op Assessment: Report given to RN and Post -op Vital signs reviewed and stable  Post vital signs: Reviewed and stable  Last Vitals:  Filed Vitals:   01/03/15 2107 01/04/15 0500  BP: 130/74 157/67  Pulse: 62 65  Temp: 36.7 C 36.5 C  Resp: 18     Complications: No apparent anesthesia complications   Pt VSS during transport to ICU. Pt met my ICU RN and RT. Report given to RN/RT and all questions answered. Pt connected to ICU monitors and continued stability confirmed.  BP 114/55 after patient in room.

## 2015-01-05 ENCOUNTER — Inpatient Hospital Stay (HOSPITAL_COMMUNITY): Payer: Commercial Managed Care - HMO

## 2015-01-05 LAB — POCT I-STAT, CHEM 8
BUN: 7 mg/dL (ref 6–20)
Calcium, Ion: 1.39 mmol/L — ABNORMAL HIGH (ref 1.13–1.30)
Chloride: 99 mmol/L — ABNORMAL LOW (ref 101–111)
Creatinine, Ser: 0.7 mg/dL (ref 0.44–1.00)
Glucose, Bld: 131 mg/dL — ABNORMAL HIGH (ref 65–99)
HCT: 32 % — ABNORMAL LOW (ref 36.0–46.0)
Hemoglobin: 10.9 g/dL — ABNORMAL LOW (ref 12.0–15.0)
Potassium: 4.2 mmol/L (ref 3.5–5.1)
Sodium: 136 mmol/L (ref 135–145)
TCO2: 28 mmol/L (ref 0–100)

## 2015-01-05 LAB — GLUCOSE, CAPILLARY
Glucose-Capillary: 100 mg/dL — ABNORMAL HIGH (ref 65–99)
Glucose-Capillary: 101 mg/dL — ABNORMAL HIGH (ref 65–99)
Glucose-Capillary: 101 mg/dL — ABNORMAL HIGH (ref 65–99)
Glucose-Capillary: 103 mg/dL — ABNORMAL HIGH (ref 65–99)
Glucose-Capillary: 104 mg/dL — ABNORMAL HIGH (ref 65–99)
Glucose-Capillary: 105 mg/dL — ABNORMAL HIGH (ref 65–99)
Glucose-Capillary: 106 mg/dL — ABNORMAL HIGH (ref 65–99)
Glucose-Capillary: 107 mg/dL — ABNORMAL HIGH (ref 65–99)
Glucose-Capillary: 110 mg/dL — ABNORMAL HIGH (ref 65–99)
Glucose-Capillary: 111 mg/dL — ABNORMAL HIGH (ref 65–99)
Glucose-Capillary: 111 mg/dL — ABNORMAL HIGH (ref 65–99)
Glucose-Capillary: 114 mg/dL — ABNORMAL HIGH (ref 65–99)
Glucose-Capillary: 119 mg/dL — ABNORMAL HIGH (ref 65–99)
Glucose-Capillary: 119 mg/dL — ABNORMAL HIGH (ref 65–99)
Glucose-Capillary: 120 mg/dL — ABNORMAL HIGH (ref 65–99)
Glucose-Capillary: 122 mg/dL — ABNORMAL HIGH (ref 65–99)
Glucose-Capillary: 139 mg/dL — ABNORMAL HIGH (ref 65–99)
Glucose-Capillary: 95 mg/dL (ref 65–99)

## 2015-01-05 LAB — CBC
HCT: 33.9 % — ABNORMAL LOW (ref 36.0–46.0)
HCT: 32.5 % — ABNORMAL LOW (ref 36.0–46.0)
Hemoglobin: 11.3 g/dL — ABNORMAL LOW (ref 12.0–15.0)
Hemoglobin: 10.7 g/dL — ABNORMAL LOW (ref 12.0–15.0)
MCH: 31.5 pg (ref 26.0–34.0)
MCH: 31.1 pg (ref 26.0–34.0)
MCHC: 33.3 g/dL (ref 30.0–36.0)
MCHC: 32.9 g/dL (ref 30.0–36.0)
MCV: 94.4 fL (ref 78.0–100.0)
MCV: 94.5 fL (ref 78.0–100.0)
Platelets: 162 10*3/uL (ref 150–400)
Platelets: 158 10*3/uL (ref 150–400)
RBC: 3.59 MIL/uL — ABNORMAL LOW (ref 3.87–5.11)
RBC: 3.44 MIL/uL — ABNORMAL LOW (ref 3.87–5.11)
RDW: 13 % (ref 11.5–15.5)
RDW: 13 % (ref 11.5–15.5)
WBC: 10 10*3/uL (ref 4.0–10.5)
WBC: 7.5 10*3/uL (ref 4.0–10.5)

## 2015-01-05 LAB — BASIC METABOLIC PANEL
Anion gap: 3 — ABNORMAL LOW (ref 5–15)
BUN: <5 mg/dL — ABNORMAL LOW (ref 6–20)
CO2: 28 mmol/L (ref 22–32)
Calcium: 9.1 mg/dL (ref 8.9–10.3)
Chloride: 107 mmol/L (ref 101–111)
Creatinine, Ser: 0.64 mg/dL (ref 0.44–1.00)
GFR calc Af Amer: >60 mL/min (ref >60)
GFR calc non Af Amer: >60 mL/min (ref >60)
Glucose, Bld: 128 mg/dL — ABNORMAL HIGH (ref 65–99)
Potassium: 4 mmol/L (ref 3.5–5.1)
Sodium: 138 mmol/L (ref 135–145)

## 2015-01-05 LAB — HEMOGLOBIN A1C
Hgb A1c MFr Bld: 6.2 % — ABNORMAL HIGH (ref 4.8–5.6)
Mean Plasma Glucose: 131 mg/dL

## 2015-01-05 LAB — CREATININE, SERUM
Creatinine, Ser: 0.74 mg/dL (ref 0.44–1.00)
GFR calc Af Amer: >60 mL/min (ref >60)
GFR calc non Af Amer: >60 mL/min (ref >60)

## 2015-01-05 LAB — MAGNESIUM
Magnesium: 2.2 mg/dL (ref 1.7–2.4)
Magnesium: 1.9 mg/dL (ref 1.7–2.4)

## 2015-01-05 MED ORDER — AMIODARONE HCL IN DEXTROSE 360-4.14 MG/200ML-% IV SOLN
60.0000 mg/h | INTRAVENOUS | Status: AC
  Administered 2015-01-05 – 2015-01-06 (×2): 60 mg/h via INTRAVENOUS
  Filled 2015-01-05 (×2): qty 200

## 2015-01-05 MED ORDER — ALBUMIN HUMAN 5 % IV SOLN
12.5000 g | Freq: Once | INTRAVENOUS | Status: AC
  Administered 2015-01-05: 12.5 g via INTRAVENOUS

## 2015-01-05 MED ORDER — AMIODARONE HCL 200 MG PO TABS
200.0000 mg | ORAL_TABLET | Freq: Two times a day (BID) | ORAL | Status: DC
  Administered 2015-01-06 – 2015-01-07 (×3): 200 mg via ORAL
  Filled 2015-01-05 (×6): qty 1

## 2015-01-05 MED ORDER — FUROSEMIDE 10 MG/ML IJ SOLN
20.0000 mg | Freq: Once | INTRAMUSCULAR | Status: AC
  Administered 2015-01-05: 20 mg via INTRAVENOUS
  Filled 2015-01-05: qty 2

## 2015-01-05 MED ORDER — ENOXAPARIN SODIUM 30 MG/0.3ML ~~LOC~~ SOLN
30.0000 mg | SUBCUTANEOUS | Status: DC
Start: 2015-01-05 — End: 2015-01-10
  Administered 2015-01-05 – 2015-01-09 (×5): 30 mg via SUBCUTANEOUS
  Filled 2015-01-05 (×6): qty 0.3

## 2015-01-05 MED ORDER — INSULIN ASPART 100 UNIT/ML ~~LOC~~ SOLN
0.0000 [IU] | SUBCUTANEOUS | Status: DC
  Administered 2015-01-06 (×2): 2 [IU] via SUBCUTANEOUS

## 2015-01-05 MED ORDER — INSULIN DETEMIR 100 UNIT/ML ~~LOC~~ SOLN
10.0000 [IU] | Freq: Once | SUBCUTANEOUS | Status: AC
  Administered 2015-01-05: 10 [IU] via SUBCUTANEOUS
  Filled 2015-01-05: qty 0.1

## 2015-01-05 MED ORDER — INSULIN DETEMIR 100 UNIT/ML ~~LOC~~ SOLN
10.0000 [IU] | Freq: Every day | SUBCUTANEOUS | Status: DC
  Administered 2015-01-06: 10 [IU] via SUBCUTANEOUS
  Filled 2015-01-05: qty 0.1

## 2015-01-05 MED ORDER — AMIODARONE LOAD VIA INFUSION
150.0000 mg | Freq: Once | INTRAVENOUS | Status: AC
  Administered 2015-01-05: 150 mg via INTRAVENOUS
  Filled 2015-01-05: qty 83.34

## 2015-01-05 MED ORDER — AMIODARONE HCL IN DEXTROSE 360-4.14 MG/200ML-% IV SOLN
30.0000 mg/h | INTRAVENOUS | Status: DC
  Administered 2015-01-06: 30 mg/h via INTRAVENOUS
  Filled 2015-01-05 (×5): qty 200

## 2015-01-05 MED ORDER — AMIODARONE HCL 200 MG PO TABS: 200.0000 mg | ORAL_TABLET | Freq: Every day | ORAL | Status: DC

## 2015-01-05 NOTE — Progress Notes (Signed)
Patient ID: [name], female   DOB: Aug 25, 1940, 74 y.o.   MRN: [id number] TCTS DAILY ICU PROGRESS NOTE                   [address]            [place] [address]          [phone]   1 Day Post-Op Procedure(s) (LRB): CORONARY ARTERY BYPASS GRAFTING (CABG) x 3 using left internal mammory artery and greater saphenous vein right leg harvested endoscopically. (N/A) TRANSESOPHAGEAL ECHOCARDIOGRAM (TEE) (N/A) CLIPPING OF ATRIAL APPENDAGE (N/A)  Total Length of Stay:  LOS: 4 days   Subjective: Extubated, awake and alert neuro intact  Objective: Vital signs in last 24 hours: Temp:  [95.9 F (35.5 C)-99.5 F (37.5 C)] 98.4 F (36.9 C) (11/26 0900) Pulse Rate:  [89-91] 90 (11/26 0900) Cardiac Rhythm:  [-] Atrial paced (11/26 0800) Resp:  [12-27] 17 (11/26 0900) BP: (79-165)/(45-102) 114/58 mmHg (11/26 0900) SpO2:  [88 %-100 %] 96 % (11/26 0900) Arterial Line BP: (65-226)/(40-119) 65/62 mmHg (11/25 2045) FiO2 (%):  [40 %-50 %] 40 % (11/25 1756) Weight:  [148 lb 9.4 oz (67.4 kg)] 148 lb 9.4 oz (67.4 kg) (11/26 0530)  Filed Weights   01/03/15 0444 01/04/15 0500 01/05/15 0530  Weight: 144 lb 1.6 oz (65.363 kg) 141 lb 4.8 oz (64.093 kg) 148 lb 9.4 oz (67.4 kg)    Weight change: 7 lb 4.6 oz (3.307 kg)   Hemodynamic parameters for last 24 hours: PAP: (16-63)/(9-54) 31/15 mmHg CO:  [1.9 L/min-3.9 L/min] 3.2 L/min CI:  [1.1 L/min/m2-2.3 L/min/m2] 1.9 L/min/m2  Intake/Output from previous day: 11/25 0701 - 11/26 0700 In: 6081.9 [P.O.:240; I.V.:3921.9; Blood:570; IV Piggyback:1350] Out: [id number] [Urine:5210; Blood:1600; Chest Tube:280]  Intake/Output this shift: Total I/O In: 333 [I.V.:333] Out: 60 [Urine:40; Chest Tube:20]  Current Meds: Scheduled Meds: . acetaminophen  1,000 mg Oral 4 times per day   Or  . acetaminophen (TYLENOL) oral liquid 160 mg/5 mL  1,000 mg Per Tube 4 times per day  . antiseptic oral rinse  7 mL Mouth Rinse QID  . aspirin EC  325 mg  Oral Daily   Or  . aspirin  324 mg Per Tube Daily  . bisacodyl  10 mg Oral Daily   Or  . bisacodyl  10 mg Rectal Daily  . cefUROXime (ZINACEF)  IV  1.5 g Intravenous Q12H  . chlorhexidine  15 mL Mouth/Throat BID  . citalopram  20 mg Oral Daily  . docusate sodium  200 mg Oral Daily  . famotidine (PEPCID) IV  20 mg Intravenous Q12H  . fluticasone  2 spray Each Nare Daily  . insulin regular  0-10 Units Intravenous TID WC  . loratadine  10 mg Oral Daily  . metoprolol tartrate  12.5 mg Oral BID   Or  . metoprolol tartrate  12.5 mg Per Tube BID  . [START ON 01/06/2015] pantoprazole  40 mg Oral Daily  . simvastatin  20 mg Oral q1800  . sodium chloride  3 mL Intravenous Q12H   Continuous Infusions: . sodium chloride 10 mL/hr at 01/05/15 0900  . sodium chloride    . sodium chloride Stopped (01/05/15 0800)  . dexmedetomidine Stopped (01/04/15 1600)  . DOPamine 3 mcg/kg/min (01/05/15 0900)  . insulin (NOVOLIN-R) infusion 1.2 Units/hr (01/05/15 0900)  . lactated ringers Stopped (01/05/15 0800)  . lactated ringers 10 mL/hr at 01/05/15 0900  . nitroGLYCERIN Stopped (01/04/15 1938)  .  phenylephrine (NEO-SYNEPHRINE) Adult infusion Stopped (01/04/15 1800)   PRN Meds:.sodium chloride, albumin human, lactated ringers, metoprolol, midazolam, morphine injection, ondansetron (ZOFRAN) IV, oxyCODONE, sodium chloride, traMADol  General appearance: alert, cooperative and no distress Neurologic: intact Heart: regular rate and rhythm, S1, S2 normal, no murmur, click, rub or gallop Lungs: diminished breath sounds bibasilar Abdomen: soft, non-tender; bowel sounds normal; no masses,  no organomegaly Extremities: extremities normal, atraumatic, no cyanosis or edema, Homans sign is negative, no sign of DVT and no edema, redness or tenderness in the calves or thighs Wound: sternum intact, low chest tube output  Lab Results: CBC: Recent Labs  01/04/15 1820 01/04/15 1821 01/05/15 0430  WBC 9.9  --   10.0  HGB 12.3 12.9 11.3*  HCT 36.5 38.0 33.9*  PLT 154  --  162   BMET:  Recent Labs  01/04/15 0304  01/04/15 1821 01/05/15 0430  NA 137  < > 140 138  K 3.9  < > 4.4 4.0  CL 105  < > 109 107  CO2 27  --   --  28  GLUCOSE 119*  < > 161* 128*  BUN 7  < > 4* <5*  CREATININE 0.68  < > 0.60 0.64  CALCIUM 10.2  --   --  9.1  < > = values in this interval not displayed.  PT/INR:  Recent Labs  01/04/15 1252  LABPROT 17.0*  INR 1.37   Radiology: Dg Chest Port 1 View  01/04/2015  CLINICAL DATA:  CABG EXAM: PORTABLE CHEST 1 VIEW COMPARISON:  01/02/2015 FINDINGS: Postop CABG. Endotracheal tube is high at T2 and could be advanced approximately 3 cm. NG tube in the stomach. Swan-Ganz catheter tip in the right main pulmonary artery. Left chest tube in place. No pneumothorax. Left lower lobe atelectasis and small left effusion. Negative for heart failure. IMPRESSION: Endotracheal tube is high and could be advanced 3 cm Left lower lobe atelectasis and effusion.  No pneumothorax Electronically Signed   By: Franchot Gallo M.D.   On: 01/04/2015 13:17     Assessment/Plan: S/P Procedure(s) (LRB): CORONARY ARTERY BYPASS GRAFTING (CABG) x 3 using left internal mammory artery and greater saphenous vein right leg harvested endoscopically. (N/A) TRANSESOPHAGEAL ECHOCARDIOGRAM (TEE) (N/A) CLIPPING OF ATRIAL APPENDAGE (N/A) Mobilize Diuresis Diabetes control d/c tubes/lines Continue foley due to strict I&O and urinary output monitoring See progression orders Expected Acute  Blood - loss Anemia    Grace Isaac 01/05/2015 9:32 AM

## 2015-01-05 NOTE — Progress Notes (Signed)
Pt due to ambulated prior to getting back to bed from chair, bp sbp 70-80 with pale color to face noted, MD paged and notified of changes in patient status.Orders for 250cc of albumin and dopamine @ 34mcg placed, will continue to monitor.

## 2015-01-06 ENCOUNTER — Inpatient Hospital Stay (HOSPITAL_COMMUNITY): Payer: Commercial Managed Care - HMO

## 2015-01-06 LAB — BASIC METABOLIC PANEL
Anion gap: 4 — ABNORMAL LOW (ref 5–15)
Anion gap: 7 (ref 5–15)
BUN: 7 mg/dL (ref 6–20)
BUN: 9 mg/dL (ref 6–20)
CO2: 29 mmol/L (ref 22–32)
CO2: 27 mmol/L (ref 22–32)
Calcium: 9.4 mg/dL (ref 8.9–10.3)
Calcium: 9.8 mg/dL (ref 8.9–10.3)
Chloride: 102 mmol/L (ref 101–111)
Chloride: 100 mmol/L — ABNORMAL LOW (ref 101–111)
Creatinine, Ser: 0.69 mg/dL (ref 0.44–1.00)
Creatinine, Ser: 0.87 mg/dL (ref 0.44–1.00)
GFR calc Af Amer: >60 mL/min (ref >60)
GFR calc Af Amer: >60 mL/min (ref >60)
GFR calc non Af Amer: >60 mL/min (ref >60)
GFR calc non Af Amer: >60 mL/min (ref >60)
Glucose, Bld: 131 mg/dL — ABNORMAL HIGH (ref 65–99)
Glucose, Bld: 141 mg/dL — ABNORMAL HIGH (ref 65–99)
Potassium: 4.1 mmol/L (ref 3.5–5.1)
Potassium: 4.4 mmol/L (ref 3.5–5.1)
Sodium: 135 mmol/L (ref 135–145)
Sodium: 134 mmol/L — ABNORMAL LOW (ref 135–145)

## 2015-01-06 LAB — CBC
HCT: 33.9 % — ABNORMAL LOW (ref 36.0–46.0)
Hemoglobin: 11.2 g/dL — ABNORMAL LOW (ref 12.0–15.0)
MCH: 31.5 pg (ref 26.0–34.0)
MCHC: 33 g/dL (ref 30.0–36.0)
MCV: 95.5 fL (ref 78.0–100.0)
Platelets: 174 10*3/uL (ref 150–400)
RBC: 3.55 MIL/uL — ABNORMAL LOW (ref 3.87–5.11)
RDW: 13.1 % (ref 11.5–15.5)
WBC: 10.6 10*3/uL — ABNORMAL HIGH (ref 4.0–10.5)

## 2015-01-06 LAB — GLUCOSE, CAPILLARY
Glucose-Capillary: 132 mg/dL — ABNORMAL HIGH (ref 65–99)
Glucose-Capillary: 120 mg/dL — ABNORMAL HIGH (ref 65–99)
Glucose-Capillary: 98 mg/dL (ref 65–99)
Glucose-Capillary: 102 mg/dL — ABNORMAL HIGH (ref 65–99)
Glucose-Capillary: 111 mg/dL — ABNORMAL HIGH (ref 65–99)

## 2015-01-06 LAB — MAGNESIUM: Magnesium: 1.9 mg/dL (ref 1.7–2.4)

## 2015-01-06 MED ORDER — ATORVASTATIN CALCIUM 20 MG PO TABS
20.0000 mg | ORAL_TABLET | Freq: Every day | ORAL | Status: DC
  Administered 2015-01-06 – 2015-01-13 (×8): 20 mg via ORAL
  Filled 2015-01-06 (×9): qty 1

## 2015-01-06 NOTE — Progress Notes (Signed)
Patient sitting up in chair.  Patient upset because RN had not put her back to bed.  Stated she was tired and needed to go to bed.  Encouraged to walk prior to going to bed.  Refused to walk.  Placed back in bed per request of patient.

## 2015-01-06 NOTE — Progress Notes (Addendum)
Patient ID: ZALEA GOVEA, female   DOB: [birth year], 74 y.o.   MRN: [id number] TCTS DAILY ICU PROGRESS NOTE                   [address]            [place], [address]          [phone]   2 Days Post-Op Procedure(s) (LRB): CORONARY ARTERY BYPASS GRAFTING (CABG) x 3 using left internal mammory artery and greater saphenous vein right leg harvested endoscopically. (N/A) TRANSESOPHAGEAL ECHOCARDIOGRAM (TEE) (N/A) CLIPPING OF ATRIAL APPENDAGE (N/A)  Total Length of Stay:  LOS: 5 days   Subjective: Still feels weak, episode of afib last pm stared on cordrone , now sinus/apced 80  Objective: Vital signs in last 24 hours: Temp:  [97.7 F (36.5 C)-98.5 F (36.9 C)] 98.1 F (36.7 C) (11/27 0743) Pulse Rate:  [90-91] 90 (11/26 1900) Cardiac Rhythm:  [-] Atrial paced (11/27 0500) Resp:  [13-19] 17 (11/27 0500) BP: (81-114)/(53-75) 102/61 mmHg (11/27 0500) SpO2:  [93 %-98 %] 98 % (11/27 0500) Weight:  [150 lb 5.7 oz (68.2 kg)] 150 lb 5.7 oz (68.2 kg) (11/27 0500)  Filed Weights   01/04/15 0500 01/05/15 0530 01/06/15 0500  Weight: 141 lb 4.8 oz (64.093 kg) 148 lb 9.4 oz (67.4 kg) 150 lb 5.7 oz (68.2 kg)    Weight change: 1 lb 12.2 oz (0.8 kg)   Hemodynamic parameters for last 24 hours:    Intake/Output from previous day: 11/26 0701 - 11/27 0700 In: 1247.1 [P.O.:240; I.V.:907.1; IV Piggyback:100] Out: 925 [Urine:865; Chest Tube:60]  Intake/Output this shift: Total I/O In: -  Out: 175 [Urine:175]  Current Meds: Scheduled Meds: . acetaminophen  1,000 mg Oral 4 times per day   Or  . acetaminophen (TYLENOL) oral liquid 160 mg/5 mL  1,000 mg Per Tube 4 times per day  . amiodarone  200 mg Oral Q12H   Followed by  . [START ON 01/13/2015] amiodarone  200 mg Oral Daily  . antiseptic oral rinse  7 mL Mouth Rinse QID  . aspirin EC  325 mg Oral Daily   Or  . aspirin  324 mg Per Tube Daily  . bisacodyl  10 mg Oral Daily   Or  . bisacodyl  10 mg Rectal Daily  .  chlorhexidine  15 mL Mouth/Throat BID  . citalopram  20 mg Oral Daily  . docusate sodium  200 mg Oral Daily  . enoxaparin (LOVENOX) injection  30 mg Subcutaneous Q24H  . fluticasone  2 spray Each Nare Daily  . insulin aspart  0-24 Units Subcutaneous 6 times per day  . insulin detemir  10 Units Subcutaneous Daily  . loratadine  10 mg Oral Daily  . metoprolol tartrate  12.5 mg Oral BID   Or  . metoprolol tartrate  12.5 mg Per Tube BID  . pantoprazole  40 mg Oral Daily  . simvastatin  20 mg Oral q1800  . sodium chloride  3 mL Intravenous Q12H   Continuous Infusions: . sodium chloride Stopped (01/05/15 1000)  . sodium chloride    . sodium chloride 10 mL/hr at 01/06/15 0700  . amiodarone 30 mg/hr (01/06/15 0907)  . dexmedetomidine Stopped (01/04/15 1600)  . DOPamine 5 mcg/kg/min (01/06/15 0700)  . insulin (NOVOLIN-R) infusion Stopped (01/05/15 1430)  . lactated ringers Stopped (01/05/15 0800)  . lactated ringers Stopped (01/05/15 1100)  . nitroGLYCERIN Stopped (01/04/15 1938)  . phenylephrine (NEO-SYNEPHRINE) Adult  infusion Stopped (01/04/15 1800)   PRN Meds:.sodium chloride, lactated ringers, metoprolol, midazolam, morphine injection, ondansetron (ZOFRAN) IV, oxyCODONE, sodium chloride, traMADol  General appearance: alert, cooperative and no distress Neurologic: intact Heart: regular rate and rhythm, S1, S2 normal, no murmur, click, rub or gallop Lungs: diminished breath sounds bibasilar Abdomen: soft, non-tender; bowel sounds normal; no masses,  no organomegaly Extremities: extremities normal, atraumatic, no cyanosis or edema and Homans sign is negative, no sign of DVT Wound: sternum stable,dressing intact  Lab Results: CBC: Recent Labs  01/05/15 1700 01/06/15 0430  WBC 7.5 10.6*  HGB 10.7* 11.2*  HCT 32.5* 33.9*  PLT 158 174   BMET:  Recent Labs  01/05/15 2323 01/06/15 0430  NA 135 134*  K 4.1 4.4  CL 102 100*  CO2 29 27  GLUCOSE 131* 141*  BUN 7 9    CREATININE 0.69 0.87  CALCIUM 9.4 9.8    PT/INR:  Recent Labs  01/04/15 1252  LABPROT 17.0*  INR 1.37   Radiology: Dg Chest Port 1 View  01/06/2015  CLINICAL DATA:  Post CABG EXAM: PORTABLE CHEST 1 VIEW COMPARISON:  01/05/2015 FINDINGS: Left-sided pacemaker and midline sternotomy wires overlies normal cardiac silhouette. Interval retraction of Swan-Ganz catheter. Removal of LEFT chest tube without pneumothorax. Atrial clip noted. Mediastinal drain removed. Persistent bilateral small effusions and basilar atelectasis are slightly worsened. IMPRESSION: Mild increase in bilateral basilar effusions and atelectasis. Removal of LEFT chest tube without pneumothorax. Electronically Signed   By: Suzy Bouchard M.D.   On: 01/06/2015 07:34     Assessment/Plan: S/P Procedure(s) (LRB): CORONARY ARTERY BYPASS GRAFTING (CABG) x 3 using left internal mammory artery and greater saphenous vein right leg harvested endoscopically. (N/A) TRANSESOPHAGEAL ECHOCARDIOGRAM (TEE) (N/A) CLIPPING OF ATRIAL APPENDAGE (N/A) Convert to po cordarone Holding sinus now, will wait to start anticoagulation  Increase ambulation H/H stable renal function stable D/c foley Refused zocor which she was getting preop, has taken before, agreeable to lipitor  On no diabetic meds preop, HgB A1c 6.2 preop  Grace Isaac 01/06/2015 10:57 AM

## 2015-01-07 ENCOUNTER — Encounter (HOSPITAL_COMMUNITY): Payer: Self-pay | Admitting: Cardiothoracic Surgery

## 2015-01-07 ENCOUNTER — Inpatient Hospital Stay (HOSPITAL_COMMUNITY): Payer: Commercial Managed Care - HMO

## 2015-01-07 DIAGNOSIS — I10 Essential (primary) hypertension: Secondary | ICD-10-CM

## 2015-01-07 DIAGNOSIS — I480 Paroxysmal atrial fibrillation: Secondary | ICD-10-CM

## 2015-01-07 LAB — BASIC METABOLIC PANEL
Anion gap: 3 — ABNORMAL LOW (ref 5–15)
BUN: 10 mg/dL (ref 6–20)
CO2: 30 mmol/L (ref 22–32)
Calcium: 10 mg/dL (ref 8.9–10.3)
Chloride: 103 mmol/L (ref 101–111)
Creatinine, Ser: 0.85 mg/dL (ref 0.44–1.00)
GFR calc Af Amer: >60 mL/min (ref >60)
GFR calc non Af Amer: >60 mL/min (ref >60)
Glucose, Bld: 113 mg/dL — ABNORMAL HIGH (ref 65–99)
Potassium: 4.6 mmol/L (ref 3.5–5.1)
Sodium: 136 mmol/L (ref 135–145)

## 2015-01-07 LAB — GLUCOSE, CAPILLARY
Glucose-Capillary: 102 mg/dL — ABNORMAL HIGH (ref 65–99)
Glucose-Capillary: 106 mg/dL — ABNORMAL HIGH (ref 65–99)
Glucose-Capillary: 83 mg/dL (ref 65–99)
Glucose-Capillary: 93 mg/dL (ref 65–99)
Glucose-Capillary: 97 mg/dL (ref 65–99)
Glucose-Capillary: 99 mg/dL (ref 65–99)

## 2015-01-07 LAB — CUP PACEART INCLINIC DEVICE CHECK
Battery Impedance: 230 Ohm
Battery Remaining Longevity: 108 mo
Battery Voltage: 2.78 V
Brady Statistic AP VP Percent: 1.6 %
Brady Statistic AP VS Percent: 95.4 %
Brady Statistic AS VP Percent: 0.1 % — CL
Brady Statistic AS VS Percent: 2.9 %
Date Time Interrogation Session: 20161128135716
Implantable Lead Implant Date: 20131212
Implantable Lead Implant Date: 20131212
Implantable Lead Location: 753859
Implantable Lead Location: 753860
Implantable Lead Model: 5076
Implantable Lead Model: 5076
Lead Channel Impedance Value: 398 Ohm
Lead Channel Impedance Value: 467 Ohm
Lead Channel Pacing Threshold Amplitude: 0.5 V
Lead Channel Pacing Threshold Amplitude: 0.625 V
Lead Channel Pacing Threshold Pulse Width: 0.4 ms
Lead Channel Pacing Threshold Pulse Width: 0.4 ms
Lead Channel Sensing Intrinsic Amplitude: 11.2 mV
Lead Channel Setting Pacing Amplitude: 2 V
Lead Channel Setting Pacing Amplitude: 2.5 V
Lead Channel Setting Pacing Pulse Width: 0.4 ms
Lead Channel Setting Sensing Sensitivity: 5.6 mV

## 2015-01-07 LAB — CBC
HCT: 32.3 % — ABNORMAL LOW (ref 36.0–46.0)
Hemoglobin: 10.6 g/dL — ABNORMAL LOW (ref 12.0–15.0)
MCH: 31.4 pg (ref 26.0–34.0)
MCHC: 32.8 g/dL (ref 30.0–36.0)
MCV: 95.6 fL (ref 78.0–100.0)
Platelets: 167 10*3/uL (ref 150–400)
RBC: 3.38 MIL/uL — ABNORMAL LOW (ref 3.87–5.11)
RDW: 13 % (ref 11.5–15.5)
WBC: 7 10*3/uL (ref 4.0–10.5)

## 2015-01-07 MED ORDER — AMIODARONE HCL 200 MG PO TABS
200.0000 mg | ORAL_TABLET | Freq: Two times a day (BID) | ORAL | Status: DC
Start: 2015-01-07 — End: 2015-01-07

## 2015-01-07 MED ORDER — AMIODARONE HCL 200 MG PO TABS
400.0000 mg | ORAL_TABLET | Freq: Two times a day (BID) | ORAL | Status: DC
  Administered 2015-01-07: 400 mg via ORAL
  Administered 2015-01-07: 200 mg via ORAL
  Administered 2015-01-08 – 2015-01-13 (×12): 400 mg via ORAL
  Filled 2015-01-07 (×16): qty 2

## 2015-01-07 MED ORDER — AMIODARONE IV BOLUS ONLY 150 MG/100ML
150.0000 mg | Freq: Once | INTRAVENOUS | Status: DC
  Filled 2015-01-07: qty 100

## 2015-01-07 MED ORDER — AMIODARONE HCL 200 MG PO TABS: 400.0000 mg | ORAL_TABLET | Freq: Two times a day (BID) | ORAL | Status: DC

## 2015-01-07 MED ORDER — AMIODARONE IV BOLUS ONLY 150 MG/100ML
150.0000 mg | Freq: Once | INTRAVENOUS | Status: AC
  Administered 2015-01-07: 150 mg via INTRAVENOUS
  Filled 2015-01-07: qty 100

## 2015-01-07 MED FILL — Mannitol IV Soln 20%: INTRAVENOUS | Qty: 500 | Status: AC

## 2015-01-07 MED FILL — Lidocaine HCl IV Inj 20 MG/ML: INTRAVENOUS | Qty: 5 | Status: AC

## 2015-01-07 MED FILL — Heparin Sodium (Porcine) Inj 1000 Unit/ML: INTRAMUSCULAR | Qty: 30 | Status: AC

## 2015-01-07 MED FILL — Heparin Sodium (Porcine) Inj 1000 Unit/ML: INTRAMUSCULAR | Qty: 10 | Status: AC

## 2015-01-07 MED FILL — Magnesium Sulfate Inj 50%: INTRAMUSCULAR | Qty: 10 | Status: AC

## 2015-01-07 MED FILL — Potassium Chloride Inj 2 mEq/ML: INTRAVENOUS | Qty: 40 | Status: AC

## 2015-01-07 MED FILL — Electrolyte-R (PH 7.4) Solution: INTRAVENOUS | Qty: 4000 | Status: AC

## 2015-01-07 MED FILL — Sodium Bicarbonate IV Soln 8.4%: INTRAVENOUS | Qty: 50 | Status: AC

## 2015-01-07 MED FILL — Sodium Chloride IV Soln 0.9%: INTRAVENOUS | Qty: 2000 | Status: AC

## 2015-01-07 NOTE — Care Management Important Message (Signed)
Important Message  Patient Details  Name: [name] MRN: [id number] Date of Birth: 05/02/1940   Medicare Important Message Given:  Yes    Nathen May 01/07/2015, 3:05 PM

## 2015-01-07 NOTE — Progress Notes (Signed)
Patient ID: [name], female   DOB: April 06, 1940, 74 y.o.   MRN: [id number] TCTS DAILY ICU PROGRESS NOTE                   [address]            [place] [address]          [phone]   3 Days Post-Op Procedure(s) (LRB): CORONARY ARTERY BYPASS GRAFTING (CABG) x 3 using left internal mammory artery and greater saphenous vein right leg harvested endoscopically. (N/A) TRANSESOPHAGEAL ECHOCARDIOGRAM (TEE) (N/A) CLIPPING OF ATRIAL APPENDAGE (N/A)  Total Length of Stay:  LOS: 6 days   Subjective: Awake and alert, ambulated today better, still on decreasing dose of dopamine  Objective: Vital signs in last 24 hours: Temp:  [97.3 F (36.3 C)-98.3 F (36.8 C)] 98.1 F (36.7 C) (11/28 1228) Cardiac Rhythm:  [-] Atrial paced (11/28 1345) Resp:  [10-36] 19 (11/28 1345) BP: (83-113)/(49-88) 97/54 mmHg (11/28 1345) SpO2:  [90 %-100 %] 96 % (11/28 1345) Weight:  [141 lb 1.5 oz (64 kg)] 141 lb 1.5 oz (64 kg) (11/28 0300)  Filed Weights   01/05/15 0530 01/06/15 0500 01/07/15 0300  Weight: 148 lb 9.4 oz (67.4 kg) 150 lb 5.7 oz (68.2 kg) 141 lb 1.5 oz (64 kg)    Weight change: -9 lb 4.2 oz (-4.2 kg)   Hemodynamic parameters for last 24 hours:    Intake/Output from previous day: 11/27 0701 - 11/28 0700 In: 1023.6 [P.O.:300; I.V.:723.6] Out: 1475 [Urine:1475]  Intake/Output this shift: Total I/O In: 444.6 [P.O.:300; I.V.:144.6] Out: 400 [Urine:400]  Current Meds: Scheduled Meds: . acetaminophen  1,000 mg Oral 4 times per day   Or  . acetaminophen (TYLENOL) oral liquid 160 mg/5 mL  1,000 mg Per Tube 4 times per day  . amiodarone  400 mg Oral BID  . antiseptic oral rinse  7 mL Mouth Rinse QID  . aspirin EC  325 mg Oral Daily   Or  . aspirin  324 mg Per Tube Daily  . atorvastatin  20 mg Oral q1800  . bisacodyl  10 mg Oral Daily   Or  . bisacodyl  10 mg Rectal Daily  . chlorhexidine  15 mL Mouth/Throat BID  . citalopram  20 mg Oral Daily  . docusate sodium   200 mg Oral Daily  . enoxaparin (LOVENOX) injection  30 mg Subcutaneous Q24H  . fluticasone  2 spray Each Nare Daily  . insulin aspart  0-24 Units Subcutaneous 6 times per day  . loratadine  10 mg Oral Daily  . metoprolol tartrate  12.5 mg Oral BID   Or  . metoprolol tartrate  12.5 mg Per Tube BID  . pantoprazole  40 mg Oral Daily  . sodium chloride  3 mL Intravenous Q12H   Continuous Infusions: . sodium chloride 10 mL/hr at 01/07/15 0700  . sodium chloride    . sodium chloride 10 mL/hr at 01/07/15 0700  . dexmedetomidine Stopped (01/04/15 1600)  . DOPamine 1 mcg/kg/min (01/07/15 1300)  . insulin (NOVOLIN-R) infusion Stopped (01/05/15 1430)  . lactated ringers Stopped (01/05/15 0800)  . lactated ringers Stopped (01/05/15 1100)   PRN Meds:.sodium chloride, lactated ringers, metoprolol, midazolam, morphine injection, ondansetron (ZOFRAN) IV, oxyCODONE, sodium chloride, traMADol  General appearance: alert and cooperative Neurologic: intact Heart: regular rate and rhythm, S1, S2 normal, no murmur, click, rub or gallop Lungs: diminished breath sounds bibasilar Abdomen: soft, non-tender; bowel sounds normal; no masses,  no organomegaly Extremities: extremities normal, atraumatic, no cyanosis or edema and Homans sign is negative, no sign of DVT Wound: steernum stable  Lab Results: CBC: Recent Labs  01/06/15 0430 01/07/15 0850  WBC 10.6* 7.0  HGB 11.2* 10.6*  HCT 33.9* 32.3*  PLT 174 167   BMET:  Recent Labs  01/06/15 0430 01/07/15 0318  NA 134* 136  K 4.4 4.6  CL 100* 103  CO2 27 30  GLUCOSE 141* 113*  BUN 9 10  CREATININE 0.87 0.85  CALCIUM 9.8 10.0    PT/INR: No results for input(s): LABPROT, INR in the last 72 hours. Radiology: Dg Chest Port 1 View  01/07/2015  CLINICAL DATA:  CABG. EXAM: PORTABLE CHEST 1 VIEW COMPARISON:  01/06/2015. FINDINGS: Right IJ sheath in stable position. Prior CABG. Cardiac pacer and lead tips in right atrium and right ventricle. Left  atrial appendage clip. Cardiomegaly with bilateral pulmonary interstitial prominence and bilateral effusions. Findings consistent with congestive heart failure. Findings have increased slightly from prior exam . Low lung volumes with basilar atelectasis. No pneumothorax. Left breast implant. IMPRESSION: 1. Right IJ sheath in stable position. 2. Prior CABG. Cardiac pacer and lead tips in right atrium and right ventricle. Left ventricular appendage clip noted. 3. Cardiomegaly with bilateral pulmonary interstitial prominence and bilateral pleural effusions. Findings consistent with congestive heart failure. Findings have increased slightly from prior exam. 4. Low lung volumes with basilar atelectasis . Electronically Signed   By: Marcello Moores  Register   On: 01/07/2015 07:14     Assessment/Plan: S/P Procedure(s) (LRB): CORONARY ARTERY BYPASS GRAFTING (CABG) x 3 using left internal mammory artery and greater saphenous vein right leg harvested endoscopically. (N/A) TRANSESOPHAGEAL ECHOCARDIOGRAM (TEE) (N/A) CLIPPING OF ATRIAL APPENDAGE (N/A) Mobilize Diabetes controlnow in sinus after brief episode of afib this am Weaning off dopamine  , to step down when off dopamine   Grace Isaac 01/07/2015 1:56 PM

## 2015-01-07 NOTE — Progress Notes (Addendum)
SUBJECTIVE:  Feels tired and weak  OBJECTIVE:   Vitals:   Filed Vitals:   01/07/15 0900 01/07/15 0915 01/07/15 0930 01/07/15 0945  BP: 93/56 91/67 101/73 112/63  Pulse:      Temp:      TempSrc:      Resp: 17 19 36 15  Height:      Weight:      SpO2: 98% 97% 97% 99%   I&O's:   Intake/Output Summary (Last 24 hours) at 01/07/15 1005 Last data filed at 01/07/15 0900  Gross per 24 hour  Intake  926.9 ml  Output   1300 ml  Net -373.1 ml   TELEMETRY: Reviewed telemetry pt in atrial fibrillation with CVR:     PHYSICAL EXAM General: Well developed, well nourished, in no acute distress Head: Eyes PERRLA, No xanthomas.   Normal cephalic and atramatic  Lungs:   Clear bilaterally to auscultation and percussion. Heart:   Irregularly irregular S1 S2 Pulses are 2+ & equal. Abdomen: Bowel sounds are positive, abdomen soft and non-tender without masses  Extremities:   No clubbing, cyanosis or edema.  DP +1 Neuro: Alert and oriented X 3. Psych:  Good affect, responds appropriately   LABS: Basic Metabolic Panel:  Recent Labs  01/05/15 1700 01/05/15 2323 01/06/15 0430 01/07/15 0318  NA  --  135 134* 136  K  --  4.1 4.4 4.6  CL  --  102 100* 103  CO2  --  29 27 30   GLUCOSE  --  131* 141* 113*  BUN  --  7 9 10   CREATININE 0.74 0.69 0.87 0.85  CALCIUM  --  9.4 9.8 10.0  MG 1.9 1.9  --   --    Liver Function Tests: No results for input(s): AST, ALT, ALKPHOS, BILITOT, PROT, ALBUMIN in the last 72 hours. No results for input(s): LIPASE, AMYLASE in the last 72 hours. CBC:  Recent Labs  01/06/15 0430 01/07/15 0850  WBC 10.6* 7.0  HGB 11.2* 10.6*  HCT 33.9* 32.3*  MCV 95.5 95.6  PLT 174 167   Cardiac Enzymes: No results for input(s): CKTOTAL, CKMB, CKMBINDEX, TROPONINI in the last 72 hours. BNP: Invalid input(s): POCBNP D-Dimer: No results for input(s): DDIMER in the last 72 hours. Hemoglobin A1C: No results for input(s): HGBA1C in the last 72 hours. Fasting  Lipid Panel: No results for input(s): CHOL, HDL, LDLCALC, TRIG, CHOLHDL, LDLDIRECT in the last 72 hours. Thyroid Function Tests: No results for input(s): TSH, T4TOTAL, T3FREE, THYROIDAB in the last 72 hours.  Invalid input(s): FREET3 Anemia Panel: No results for input(s): VITAMINB12, FOLATE, FERRITIN, TIBC, IRON, RETICCTPCT in the last 72 hours. Coag Panel:   Lab Results  Component Value Date   INR 1.37 01/04/2015   INR 1.05 01/03/2015   INR 1.74* 12/28/2014    RADIOLOGY: Dg Chest 2 View  01/02/2015  CLINICAL DATA:  Preop EXAM: CHEST  2 VIEW COMPARISON:  06/03/2012 FINDINGS: Cardiomediastinal silhouette is stable. Dual lead cardiac pacemaker is unchanged in position. No acute infiltrate or pleural effusion. No pulmonary edema. Bony thorax is unremarkable. IMPRESSION: No active cardiopulmonary disease. Electronically Signed   By: Lahoma Crocker M.D.   On: 01/02/2015 08:43   Dg Chest Port 1 View  01/07/2015  CLINICAL DATA:  CABG. EXAM: PORTABLE CHEST 1 VIEW COMPARISON:  01/06/2015. FINDINGS: Right IJ sheath in stable position. Prior CABG. Cardiac pacer and lead tips in right atrium and right ventricle. Left atrial appendage clip. Cardiomegaly with bilateral pulmonary  interstitial prominence and bilateral effusions. Findings consistent with congestive heart failure. Findings have increased slightly from prior exam . Low lung volumes with basilar atelectasis. No pneumothorax. Left breast implant. IMPRESSION: 1. Right IJ sheath in stable position. 2. Prior CABG. Cardiac pacer and lead tips in right atrium and right ventricle. Left ventricular appendage clip noted. 3. Cardiomegaly with bilateral pulmonary interstitial prominence and bilateral pleural effusions. Findings consistent with congestive heart failure. Findings have increased slightly from prior exam. 4. Low lung volumes with basilar atelectasis . Electronically Signed   By: Marcello Moores  Register   On: 01/07/2015 07:14   Dg Chest Port 1  View  01/06/2015  CLINICAL DATA:  Post CABG EXAM: PORTABLE CHEST 1 VIEW COMPARISON:  01/05/2015 FINDINGS: Left-sided pacemaker and midline sternotomy wires overlies normal cardiac silhouette. Interval retraction of Swan-Ganz catheter. Removal of LEFT chest tube without pneumothorax. Atrial clip noted. Mediastinal drain removed. Persistent bilateral small effusions and basilar atelectasis are slightly worsened. IMPRESSION: Mild increase in bilateral basilar effusions and atelectasis. Removal of LEFT chest tube without pneumothorax. Electronically Signed   By: Suzy Bouchard M.D.   On: 01/06/2015 07:34   Dg Chest Port 1 View  01/05/2015  CLINICAL DATA:  CABG 01/04/2015 EXAM: PORTABLE CHEST 1 VIEW COMPARISON:  01/04/2015 FINDINGS: Interval extubation with no change in LEFT basilar atelectasis. No new atelectasis. Swan-Ganz catheter remains unchanged. LEFT chest tube and mediastinal drain are unchanged. Removal of NG tube. Atrial clip noted. No pneumothorax. IMPRESSION: 1. Interval extubation without complication. 2. Stable remaining support apparatus. 3. LEFT chest tube in place without pneumothorax. 4. LEFT basilar atelectasis small effusion. Electronically Signed   By: Suzy Bouchard M.D.   On: 01/05/2015 10:09   Dg Chest Port 1 View  01/04/2015  CLINICAL DATA:  CABG EXAM: PORTABLE CHEST 1 VIEW COMPARISON:  01/02/2015 FINDINGS: Postop CABG. Endotracheal tube is high at T2 and could be advanced approximately 3 cm. NG tube in the stomach. Swan-Ganz catheter tip in the right main pulmonary artery. Left chest tube in place. No pneumothorax. Left lower lobe atelectasis and small left effusion. Negative for heart failure. IMPRESSION: Endotracheal tube is high and could be advanced 3 cm Left lower lobe atelectasis and effusion.  No pneumothorax Electronically Signed   By: Franchot Gallo M.D.   On: 01/04/2015 13:17    ASSESSMENT AND PLAN:  1. Unstable angina pectoris - cath showed severe multivessel  CAD  2.  ASCAD multivessel s/p 3 vessel CABG POD #3.  Continue ASA/statin/BB.  3. Paroxysmal atrial fibrillation - had post op afib on 11/26 and then converted to NSR on PO Amio/BB. Went back into atrial fibrillation with RVR at 4am this am.  She was given Amio bolus 150mg  and continued on 200mg  Amio BID.  While in the room she converted back to AV paced rhythm.  Will increase Amio to 400mg  BID in hopes that she will maintain NSR. CHADSVasc score is at least 5 (age, gender, hypertension, vascular disease). Timing of starting anticoagulation per CVTS.  She she does not convert back on her own in next 24 hours would recommend starting anticoagulation.  4. Severe sinus bradycardia with normally functioning dual-chamber permanent pacemaker (medtronic).  5. Peripheral arterial disease status post stents to both superficial femoral arteries in 2012, currently asymptomatic  6. Hyperlipidemia. Started on statin this hospitalization.    Sueanne Margarita, MD  01/07/2015  10:05 AM

## 2015-01-07 NOTE — Op Note (Addendum)
[name], [name] NO.:  [id number]  MEDICAL RECORD NO.:  [id number]  LOCATION:  2S14C                        FACILITY:  [facility]  PHYSICIAN:  Lanelle Bal, MD    DATE OF BIRTH:  1940-12-22  DATE OF PROCEDURE:  01/04/2015 DATE OF DISCHARGE:                              OPERATIVE REPORT   PREOPERATIVE DIAGNOSIS:  Recent onset of unstable angina and anticoagulation for history of atrial fibrillation in the past.  POSTOPERATIVE DIAGNOSIS:  Recent onset of unstable angina and anticoagulation for history of atrial fibrillation in the past.  SURGICAL PROCEDURE:  Coronary artery bypass grafting x3 with left internal mammary to the left anterior descending coronary artery, reverse saphenous vein graft to the proximal posterior descending coronary artery, and reverse saphenous vein graft to large intermediate branch of the lateral wall of the heart with right thigh and calf endovein harvesting.Placement of 35 mm Acticure Atrial Clip  SURGEON:  Lanelle Bal, MD  FIRST ASSISTANT:  Lars Pinks, PA  BRIEF HISTORY:  The patient is a 74 year old female who had questionable history of atrial fibrillation in the past, since been maintained on Eliquis.  She had had rhythm disturbances and requiring placement of a permanent pacemaker in the past.  She was admitted with new onset of unstable angina.  The patient was in sinus rhythm and predominantly paced.  Cardiac catheterization was performed which demonstrated sequentials 80-90% lesions in the right coronary artery, 80% lesion in the LAD, a long 80% lesion in a large intermediate branch was the primary supplier of the lateral wall, and a small circumflex system with luminal irregularities.  Overall, ejection fraction was preserved. Because of the patient's unstable anginal symptoms, she was admitted to the hospital, placed on heparin drip as we waited for Eliquis washout. Risks and options were discussed with  the patient in detail and she was agreeable and signed informed consent.  Preoperatively, an echocardiogram was performed that showed no evidence of mitral regurgitation and very mild aortic insufficiency.  DESCRIPTION OF PROCEDURE:  With Swan-Ganz and arterial line monitors in place, the patient underwent general endotracheal anesthesia without incident.  TEE probe was placed by Dr. Tobias Alexander, this confirmed the preoperative echo findings.  There was no atrial clot appreciated.  The patient was in sinus rhythm.  We proceeded with endovein harvesting, harvesting the right greater saphenous vein from the thigh and upper calf.  The vein was of excellent quality.  Median sternotomy was performed.  Left internal mammary artery was dissected down as pedicle graft.  The distal artery was divided and had good free flow. Pericardium was opened.  Overall, ventricular function appeared preserved.  The patient was systemically heparinized.  Ascending aorta was cannulated.  The right atrium was cannulated.  An aortic root vent cardioplegia needle was introduced into the ascending aorta.  The patient was placed on cardiopulmonary bypass 2.4 L/min/m2.  Sites of anastomosis were selected and dissected at the epicardium.  The left atrial appendage was sized for 35 AccuCure atrial clip.  Patient's body temperature was cooled to 32 degrees.  Aortic crossclamp was applied and 500 mL of cold blood potassium cardioplegia was administered  with diastolic arrest of  heart.  Myocardial septal temperature was monitored throughout the crossclamp.  We turned our attention first to the distal right coronary artery lesion which was significantly calcified and had distal disease.  The posterior descending coronary artery was opened in the proximal third of the vessel, it was slightly small vessel, but did admit a 1 mm probe distally.  Using a running 7-0 Prolene, distal anastomosis was performed with a second reverse  saphenous vein graft. The heart was then elevated and a large is intermediate branch was opened, admitted a 1.5 mm probe distally.  Using a running 7-0 Prolene, distal anastomosis was performed with a segment of reverse saphenous vein graft.  The left anterior descending coronary artery in the distal 3rd was relatively small and the midportion of the LAD was opened, admitted 1.5 mm probe distally.  Using a running 8-0 Prolene, the left internal mammary artery was anastomosed to the left anterior descending coronary artery.  The heart was elevated and a 35 mm AccuCure atrial clip was applied at the base of the atrial appendage.  With the crossclamp still in place, 2 punch aortotomies were performed and each of the two vein grafts were anastomosed to the ascending aorta.  Air was evacuated from the grafts, the bulldog was removed from the mammary artery, myocardial septal temperature.  Aortic crossclamp was removed with total cross-clamp time of 74 minutes.  The patient's own intrinsic pacer, implanted pacemaker began pacing at a rate of 60.  Atrial wires were placed and the internal pacer appropriately sent to the atrium and paced the ventricle.  Ventricular wire was applied.  Sites of anastomosis were inspected free of bleeding with the body temperature rewarmed to 37 degrees.  She was then ventilated and weaned from cardiopulmonary bypass without difficulty.  She was decannulated in usual fashion.  Protamine sulfate was administered with operative field hemostatic.  Graft marker was applied.  A left pleural tube and a Blake mediastinal drain were left in place.  Pericardium was loosely reapproximated.  Sternum was closed with #6 stainless steel wire. Fascia was closed with interrupted 0 Vicryl, running 3-0 Vicryl in subcutaneous tissue for the subcuticular stitch.  We closed the skin edges.  Dry dressings were applied.  Sponge and needle counts reported as correct.  At completion of the  procedure, the patient did not require any blood bank blood products during the operative procedure.  Total pump time was 95 minutes.  At the completion of procedure, patient was transferred to the Surgical Intensive care Unit for further postoperative care, having tolerated the procedure without complication.     Lanelle Bal, MD     EG/MEDQ  D:  01/05/2015  T:  01/05/2015  Job:  [id number]

## 2015-01-07 NOTE — Progress Notes (Addendum)
T[facility] BRIEF SICU PROGRESS NOTE  3 Days Post-Op  S/P Procedure(s) (LRB): CORONARY ARTERY BYPASS GRAFTING (CABG) x 3 using left internal mammory artery and greater saphenous vein right leg harvested endoscopically. (N/A) TRANSESOPHAGEAL ECHOCARDIOGRAM (TEE) (N/A) CLIPPING OF ATRIAL APPENDAGE (N/A)   Stable day Some Afib earlier but currently maintaining NSR - AAI paced rhythm BP stable off dopamine UOP adequate  Plan: Continue current plan  Rexene Alberts, MD 01/07/2015 6:58 PM

## 2015-01-08 ENCOUNTER — Inpatient Hospital Stay (HOSPITAL_COMMUNITY): Payer: Commercial Managed Care - HMO

## 2015-01-08 LAB — GLUCOSE, CAPILLARY
Glucose-Capillary: 116 mg/dL — ABNORMAL HIGH (ref 65–99)
Glucose-Capillary: 102 mg/dL — ABNORMAL HIGH (ref 65–99)

## 2015-01-08 LAB — BASIC METABOLIC PANEL
Anion gap: 6 (ref 5–15)
BUN: 15 mg/dL (ref 6–20)
CO2: 27 mmol/L (ref 22–32)
Calcium: 9.8 mg/dL (ref 8.9–10.3)
Chloride: 100 mmol/L — ABNORMAL LOW (ref 101–111)
Creatinine, Ser: 0.84 mg/dL (ref 0.44–1.00)
GFR calc Af Amer: >60 mL/min (ref >60)
GFR calc non Af Amer: >60 mL/min (ref >60)
Glucose, Bld: 119 mg/dL — ABNORMAL HIGH (ref 65–99)
Potassium: 5 mmol/L (ref 3.5–5.1)
Sodium: 133 mmol/L — ABNORMAL LOW (ref 135–145)

## 2015-01-08 LAB — CBC
HCT: 31.9 % — ABNORMAL LOW (ref 36.0–46.0)
Hemoglobin: 10.3 g/dL — ABNORMAL LOW (ref 12.0–15.0)
MCH: 30.9 pg (ref 26.0–34.0)
MCHC: 32.3 g/dL (ref 30.0–36.0)
MCV: 95.8 fL (ref 78.0–100.0)
Platelets: 230 10*3/uL (ref 150–400)
RBC: 3.33 MIL/uL — ABNORMAL LOW (ref 3.87–5.11)
RDW: 13 % (ref 11.5–15.5)
WBC: 7.2 10*3/uL (ref 4.0–10.5)

## 2015-01-08 MED ORDER — SODIUM CHLORIDE 0.9 % IV SOLN: 250.0000 mL | INTRAVENOUS | Status: DC | PRN

## 2015-01-08 MED ORDER — MOVING RIGHT ALONG BOOK
Freq: Once | Status: AC
  Administered 2015-01-08: 09:00:00
  Filled 2015-01-08: qty 1

## 2015-01-08 MED ORDER — LEVALBUTEROL HCL 0.63 MG/3ML IN NEBU
0.6300 mg | INHALATION_SOLUTION | Freq: Three times a day (TID) | RESPIRATORY_TRACT | Status: DC
  Administered 2015-01-08 (×2): 0.63 mg via RESPIRATORY_TRACT
  Filled 2015-01-08 (×4): qty 3

## 2015-01-08 MED ORDER — CETYLPYRIDINIUM CHLORIDE 0.05 % MT LIQD
7.0000 mL | Freq: Two times a day (BID) | OROMUCOSAL | Status: DC
  Administered 2015-01-08 – 2015-01-14 (×9): 7 mL via OROMUCOSAL

## 2015-01-08 MED ORDER — SODIUM CHLORIDE 0.9 % IJ SOLN: 3.0000 mL | INTRAMUSCULAR | Status: DC | PRN

## 2015-01-08 MED ORDER — LORATADINE 10 MG PO TABS: 10.0000 mg | ORAL_TABLET | Freq: Every day | ORAL | Status: DC | PRN

## 2015-01-08 MED ORDER — FLUTICASONE PROPIONATE 50 MCG/ACT NA SUSP
2.0000 | Freq: Every day | NASAL | Status: DC | PRN
  Filled 2015-01-08: qty 16

## 2015-01-08 MED ORDER — SODIUM CHLORIDE 0.9 % IJ SOLN
3.0000 mL | Freq: Two times a day (BID) | INTRAMUSCULAR | Status: DC
  Administered 2015-01-08 – 2015-01-14 (×10): 3 mL via INTRAVENOUS

## 2015-01-08 NOTE — Progress Notes (Addendum)
CARDIAC REHAB PHASE I   Pt lying in bed visiting with company, states she just walked over from the ICU and would like to rest. Pt states she has ambulated twice today. Pt states she lives alone, discussed need for supervision/assistance at home on discharge, reviewed activity restrictions, pt verbalized understanding.  PT to see pt this afternoon if pt agreeable. Will follow-up tomorrow. Encouraged additional ambulation x1.   1345-1400  Lenna Sciara, RN, BSN 01/08/2015 1:57 PM

## 2015-01-08 NOTE — Progress Notes (Addendum)
SUBJECTIVE:  Feeling better  OBJECTIVE:   Vitals:   Filed Vitals:   01/08/15 1200 01/08/15 1251 01/08/15 1434 01/08/15 2017  BP: 112/81 131/65    Pulse:      Temp:  98.2 F (36.8 C)    TempSrc:  Oral    Resp: 18 20    Height:      Weight:      SpO2: 98% 100% 96% 96%   I&O's:   Intake/Output Summary (Last 24 hours) at 01/08/15 2101 Last data filed at 01/08/15 1818  Gross per 24 hour  Intake    580 ml  Output   1100 ml  Net   -520 ml   TELEMETRY: Reviewed telemetry pt in AV paced rhythm     PHYSICAL EXAM General: Well developed, well nourished, in no acute distress Head: Eyes PERRLA, No xanthomas.   Normal cephalic and atramatic  Lungs:  Clear bilaterally to auscultation and percussion. Heart:  HRRR S1 S2 Pulses are 2+ & equal. Abdomen: Bowel sounds are positive, abdomen soft and non-tender without masses  Extremities:  No clubbing, cyanosis or edema.  DP +1 Neuro: Alert and oriented X 3. Psych:  Good affect, responds appropriately   LABS: Basic Metabolic Panel:  Recent Labs  01/05/15 2323  01/07/15 0318 01/08/15 0505  NA 135  < > 136 133*  K 4.1  < > 4.6 5.0  CL 102  < > 103 100*  CO2 29  < > 30 27  GLUCOSE 131*  < > 113* 119*  BUN 7  < > 10 15  CREATININE 0.69  < > 0.85 0.84  CALCIUM 9.4  < > 10.0 9.8  MG 1.9  --   --   --   < > = values in this interval not displayed. Liver Function Tests: No results for input(s): AST, ALT, ALKPHOS, BILITOT, PROT, ALBUMIN in the last 72 hours. No results for input(s): LIPASE, AMYLASE in the last 72 hours. CBC:  Recent Labs  01/07/15 0850 01/08/15 0505  WBC 7.0 7.2  HGB 10.6* 10.3*  HCT 32.3* 31.9*  MCV 95.6 95.8  PLT 167 230   Cardiac Enzymes: No results for input(s): CKTOTAL, CKMB, CKMBINDEX, TROPONINI in the last 72 hours. BNP: Invalid input(s): POCBNP D-Dimer: No results for input(s): DDIMER in the last 72 hours. Hemoglobin A1C: No results for input(s): HGBA1C in the last 72 hours. Fasting Lipid  Panel: No results for input(s): CHOL, HDL, LDLCALC, TRIG, CHOLHDL, LDLDIRECT in the last 72 hours. Thyroid Function Tests: No results for input(s): TSH, T4TOTAL, T3FREE, THYROIDAB in the last 72 hours.  Invalid input(s): FREET3 Anemia Panel: No results for input(s): VITAMINB12, FOLATE, FERRITIN, TIBC, IRON, RETICCTPCT in the last 72 hours. Coag Panel:   Lab Results  Component Value Date   INR 1.37 01/04/2015   INR 1.05 01/03/2015   INR 1.74* 12/28/2014    RADIOLOGY: Dg Chest 2 View  01/02/2015  CLINICAL DATA:  Preop EXAM: CHEST  2 VIEW COMPARISON:  06/03/2012 FINDINGS: Cardiomediastinal silhouette is stable. Dual lead cardiac pacemaker is unchanged in position. No acute infiltrate or pleural effusion. No pulmonary edema. Bony thorax is unremarkable. IMPRESSION: No active cardiopulmonary disease. Electronically Signed   By: Lahoma Crocker M.D.   On: 01/02/2015 08:43   Dg Chest Port 1 View  01/08/2015  CLINICAL DATA:  Recent CABG. EXAM: PORTABLE CHEST 1 VIEW COMPARISON:  01/07/2015 FINDINGS: Sequelae of CABG and left atrial appendage clipping are again identified. Cardiac silhouette remains enlarged.  Right jugular sheath is unchanged. Left subclavian approach dual lead pacemaker remains in place. Small bilateral pleural effusions have not significantly changed. There are bibasilar parenchymal lung opacities with mild interstitial prominence, also not significantly changed. No pneumothorax is identified. IMPRESSION: 1. Unchanged support devices. 2. Cardiomegaly with unchanged pleural effusions, atelectasis, and possibly mild interstitial edema. Electronically Signed   By: Logan Bores M.D.   On: 01/08/2015 08:11   Dg Chest Port 1 View  01/07/2015  CLINICAL DATA:  CABG. EXAM: PORTABLE CHEST 1 VIEW COMPARISON:  01/06/2015. FINDINGS: Right IJ sheath in stable position. Prior CABG. Cardiac pacer and lead tips in right atrium and right ventricle. Left atrial appendage clip. Cardiomegaly with bilateral  pulmonary interstitial prominence and bilateral effusions. Findings consistent with congestive heart failure. Findings have increased slightly from prior exam . Low lung volumes with basilar atelectasis. No pneumothorax. Left breast implant. IMPRESSION: 1. Right IJ sheath in stable position. 2. Prior CABG. Cardiac pacer and lead tips in right atrium and right ventricle. Left ventricular appendage clip noted. 3. Cardiomegaly with bilateral pulmonary interstitial prominence and bilateral pleural effusions. Findings consistent with congestive heart failure. Findings have increased slightly from prior exam. 4. Low lung volumes with basilar atelectasis . Electronically Signed   By: Marcello Moores  Register   On: 01/07/2015 07:14   Dg Chest Port 1 View  01/06/2015  CLINICAL DATA:  Post CABG EXAM: PORTABLE CHEST 1 VIEW COMPARISON:  01/05/2015 FINDINGS: Left-sided pacemaker and midline sternotomy wires overlies normal cardiac silhouette. Interval retraction of Swan-Ganz catheter. Removal of LEFT chest tube without pneumothorax. Atrial clip noted. Mediastinal drain removed. Persistent bilateral small effusions and basilar atelectasis are slightly worsened. IMPRESSION: Mild increase in bilateral basilar effusions and atelectasis. Removal of LEFT chest tube without pneumothorax. Electronically Signed   By: Suzy Bouchard M.D.   On: 01/06/2015 07:34   Dg Chest Port 1 View  01/05/2015  CLINICAL DATA:  CABG 01/04/2015 EXAM: PORTABLE CHEST 1 VIEW COMPARISON:  01/04/2015 FINDINGS: Interval extubation with no change in LEFT basilar atelectasis. No new atelectasis. Swan-Ganz catheter remains unchanged. LEFT chest tube and mediastinal drain are unchanged. Removal of NG tube. Atrial clip noted. No pneumothorax. IMPRESSION: 1. Interval extubation without complication. 2. Stable remaining support apparatus. 3. LEFT chest tube in place without pneumothorax. 4. LEFT basilar atelectasis small effusion. Electronically Signed   By: Suzy Bouchard M.D.   On: 01/05/2015 10:09   Dg Chest Port 1 View  01/04/2015  CLINICAL DATA:  CABG EXAM: PORTABLE CHEST 1 VIEW COMPARISON:  01/02/2015 FINDINGS: Postop CABG. Endotracheal tube is high at T2 and could be advanced approximately 3 cm. NG tube in the stomach. Swan-Ganz catheter tip in the right main pulmonary artery. Left chest tube in place. No pneumothorax. Left lower lobe atelectasis and small left effusion. Negative for heart failure. IMPRESSION: Endotracheal tube is high and could be advanced 3 cm Left lower lobe atelectasis and effusion.  No pneumothorax Electronically Signed   By: Franchot Gallo M.D.   On: 01/04/2015 13:17    ASSESSMENT AND PLAN:  1. Unstable angina pectoris - cath showed severe multivessel CAD  2. ASCAD multivessel s/p 3 vessel CABG POD #3. Continue ASA/statin/BB.  3. Paroxysmal atrial fibrillation - had post op afib on 11/26 and then converted to NSR on PO Amio/BB. Went back into atrial fibrillation with RVR at 4am yesterady. She was given Amio bolus 150mg  and Amio increased to 400mg  BID in hopes that she will maintain NSR. CHADSVasc score is at  least 5 (age, gender, hypertension, vascular disease). Timing of starting anticoagulation per CVTS. Converted back to AV paced rhythm but had another episode of PAF last night.  Continue Amio load with 400mg  BID.  Follow daily QTc.  4. Severe sinus bradycardia with normally functioning dual-chamber permanent pacemaker (medtronic).  5. Peripheral arterial disease status post stents to both superficial femoral arteries in 2012, currently asymptomatic  6. Hyperlipidemia. Started on statin this hospitalization.     Sueanne Margarita, MD  01/08/2015  9:01 PM

## 2015-01-08 NOTE — Progress Notes (Addendum)
[facility] DAILY ICU PROGRESS NOTE                   [address]            [place] [address]          [phone]   4 Days Post-Op Procedure(s) (LRB): CORONARY ARTERY BYPASS GRAFTING (CABG) x 3 using left internal mammory artery and greater saphenous vein right leg harvested endoscopically. (N/A) TRANSESOPHAGEAL ECHOCARDIOGRAM (TEE) (N/A) CLIPPING OF ATRIAL APPENDAGE (N/A)  Total Length of Stay:  LOS: 7 days   Subjective:  [name] has no complaints.  She has ambulated some, but didn't feel that great last night.  No Bm  Objective: Vital signs in last 24 hours: Temp:  [97.7 F (36.5 C)-98.5 F (36.9 C)] 98.1 F (36.7 C) (11/29 0754) Cardiac Rhythm:  [-] Atrial paced (11/29 0600) Resp:  [13-36] 21 (11/29 0700) BP: (84-131)/(42-100) 131/61 mmHg (11/29 0600) SpO2:  [89 %-100 %] 99 % (11/29 0700) Weight:  [149 lb 14.4 oz (67.994 kg)] 149 lb 14.4 oz (67.994 kg) (11/29 0400)  Filed Weights   01/06/15 0500 01/07/15 0300 01/08/15 0400  Weight: 150 lb 5.7 oz (68.2 kg) 141 lb 1.5 oz (64 kg) 149 lb 14.4 oz (67.994 kg)    Weight change: 8 lb 12.9 oz (3.994 kg)   Intake/Output from previous day: 11/28 0701 - 11/29 0700 In: 968.2 [P.O.:400; I.V.:568.2] Out: 1000 [Urine:1000]  Intake/Output this shift: Total I/O In: -  Out: 350 [Urine:350]  Current Meds: Scheduled Meds: . acetaminophen  1,000 mg Oral 4 times per day   Or  . acetaminophen (TYLENOL) oral liquid 160 mg/5 mL  1,000 mg Per Tube 4 times per day  . amiodarone  400 mg Oral BID  . antiseptic oral rinse  7 mL Mouth Rinse QID  . aspirin EC  325 mg Oral Daily   Or  . aspirin  324 mg Per Tube Daily  . atorvastatin  20 mg Oral q1800  . bisacodyl  10 mg Oral Daily   Or  . bisacodyl  10 mg Rectal Daily  . chlorhexidine  15 mL Mouth/Throat BID  . citalopram  20 mg Oral Daily  . docusate sodium  200 mg Oral Daily  . enoxaparin (LOVENOX) injection  30 mg Subcutaneous Q24H  . fluticasone  2 spray Each  Nare Daily  . insulin aspart  0-24 Units Subcutaneous 6 times per day  . loratadine  10 mg Oral Daily  . metoprolol tartrate  12.5 mg Oral BID   Or  . metoprolol tartrate  12.5 mg Per Tube BID  . pantoprazole  40 mg Oral Daily  . sodium chloride  3 mL Intravenous Q12H   Continuous Infusions: . sodium chloride 10 mL/hr at 01/07/15 0700  . sodium chloride    . sodium chloride 20 mL (01/07/15 2037)  . dexmedetomidine Stopped (01/04/15 1600)  . DOPamine Stopped (01/07/15 1600)  . insulin (NOVOLIN-R) infusion Stopped (01/05/15 1430)  . lactated ringers Stopped (01/05/15 0800)  . lactated ringers Stopped (01/05/15 1100)   PRN Meds:.sodium chloride, lactated ringers, metoprolol, midazolam, morphine injection, ondansetron (ZOFRAN) IV, oxyCODONE, sodium chloride, traMADol  General appearance: alert, cooperative and no distress Heart: regular rate and rhythm Lungs: clear to auscultation bilaterally Abdomen: soft, non-tender; bowel sounds normal; no masses,  no organomegaly Extremities: edema trace Wound: clean and dry  Lab Results: CBC: Recent Labs  01/07/15 0850 01/08/15 0505  WBC 7.0 7.2  HGB 10.6*  10.3*  HCT 32.3* 31.9*  PLT 167 230   BMET:  Recent Labs  01/07/15 0318 01/08/15 0505  NA 136 133*  K 4.6 5.0  CL 103 100*  CO2 30 27  GLUCOSE 113* 119*  BUN 10 15  CREATININE 0.85 0.84  CALCIUM 10.0 9.8    PT/INR: No results for input(s): LABPROT, INR in the last 72 hours. Radiology: Dg Chest Port 1 View  01/08/2015  CLINICAL DATA:  Recent CABG. EXAM: PORTABLE CHEST 1 VIEW COMPARISON:  01/07/2015 FINDINGS: Sequelae of CABG and left atrial appendage clipping are again identified. Cardiac silhouette remains enlarged. Right jugular sheath is unchanged. Left subclavian approach dual lead pacemaker remains in place. Small bilateral pleural effusions have not significantly changed. There are bibasilar parenchymal lung opacities with mild interstitial prominence, also not  significantly changed. No pneumothorax is identified. IMPRESSION: 1. Unchanged support devices. 2. Cardiomegaly with unchanged pleural effusions, atelectasis, and possibly mild interstitial edema. Electronically Signed   By: Logan Bores M.D.   On: 01/08/2015 08:11     Assessment/Plan: S/P Procedure(s) (LRB): CORONARY ARTERY BYPASS GRAFTING (CABG) x 3 using left internal mammory artery and greater saphenous vein right leg harvested endoscopically. (N/A) TRANSESOPHAGEAL ECHOCARDIOGRAM (TEE) (N/A) CLIPPING OF ATRIAL APPENDAGE (N/A)  1. CV- Episode of A. Fib last night, converted after bolus, continues to maintain NSR this morning- continue Amiodarone, Lopressor, backup external pacer increased 2. Pulm- wean oxygen as tolerated, continue IS 3. Renal- creatinine WNL 4. CBgs controlled, not a diabetic will d/c glucose checks 5. Dispo- patient stable, off drips, maintaining NSR... Will transfer to circle     [name], [name] 01/08/2015 8:23 AM  holding sinus rhythm, internal pacer at a pacing at 60 , will turn up external a pacer to pace at 42  Was on elliquis  Preop, so far have not restarted. Will wait until pacer wires are not needed and out before restarting  On Lovenox for dvt prohalaxixI have seen and examined [name] and agree with the above assessment  and plan.  Grace Isaac MD Beeper [phone] Office [phone] 01/08/2015 10:22 AM

## 2015-01-08 NOTE — Evaluation (Signed)
Physical Therapy Evaluation Patient Details Name: [name] MRN: [id number] DOB: 02-Mar-1940 Today's Date: 01/08/2015   History of Present Illness  Patient is a 74 y/o female s/p CABG x3 and clipping of atrial appendage. PMH includes HTN, CAD, PAD, PVD, mitral regurgitation, pacemaker, PAF.  Clinical Impression  Patient presents with generalized weakness, fatigue, and impaired mobility s/p CABG. Education on sternal precautions. Tolerated ambulation with Min guard assist for safety but requires mod A for bed mobility at this time. Pt lives alone but has support from family/friends and is planning to have someone stay with her at home. Will follow acutely to maximize independence and mobility prior to return home.    Follow Up Recommendations Home health PT;Supervision - Intermittent    Equipment Recommendations  None recommended by PT    Recommendations for Other Services OT consult     Precautions / Restrictions Precautions Precautions: Sternal Restrictions Weight Bearing Restrictions: No      Mobility  Bed Mobility Overal bed mobility: Needs Assistance Bed Mobility: Supine to Sit     Supine to sit: Mod assist;HOB elevated     General bed mobility comments: Mod A to elevate trunk. Pt able to bring LEs to EOB without assist. Cues to adhere to sternal precautions.  Transfers Overall transfer level: Needs assistance Equipment used: Rolling walker (2 wheeled) Transfers: Sit to/from Stand Sit to Stand: Min guard         General transfer comment: Min guard for safety. Stood without UEs.   Ambulation/Gait Ambulation/Gait assistance: Min guard Ambulation Distance (Feet): 150 Feet Assistive device: Rolling walker (2 wheeled) Gait Pattern/deviations: Step-through pattern;Decreased stride length Gait velocity: decreased Gait velocity interpretation: <1.8 ft/sec, indicative of risk for recurrent falls General Gait Details: SLow, mildly unsteady gait. Sp02 mid 80s on  2L/min 02. DOE.   Stairs            Wheelchair Mobility    Modified Rankin (Stroke Patients Only)       Balance Overall balance assessment: Needs assistance Sitting-balance support: Feet supported;No upper extremity supported Sitting balance-Leahy Scale: Good     Standing balance support: During functional activity Standing balance-Leahy Scale: Fair Standing balance comment: Able to stand unsupported for short periods without assist, requires UE support for dynamic standing.                             Pertinent Vitals/Pain Pain Assessment: Faces Faces Pain Scale: Hurts a little bit Pain Location: incision Pain Intervention(s): Monitored during session;Repositioned    Home Living Family/patient expects to be discharged to:: Private residence Living Arrangements: Alone Available Help at Discharge: Family Type of Home: House Home Access: Stairs to enter Entrance Stairs-Rails: Right Entrance Stairs-Number of Steps: 6 Home Layout: One level Home Equipment: Walker - 4 wheels      Prior Function Level of Independence: Independent         Comments: Pt works out at [employer] daily.     Hand Dominance        Extremity/Trunk Assessment   Upper Extremity Assessment: Defer to OT evaluation           Lower Extremity Assessment: Generalized weakness         Communication   Communication: No difficulties  Cognition Arousal/Alertness: Awake/alert Behavior During Therapy: WFL for tasks assessed/performed Overall Cognitive Status: Within Functional Limits for tasks assessed  General Comments General comments (skin integrity, edema, etc.): Daughter present during session.    Exercises        Assessment/Plan    PT Assessment Patient needs continued PT services  PT Diagnosis Difficulty walking;Generalized weakness   PT Problem List Decreased strength;Pain;Cardiopulmonary status limiting activity;Decreased  activity tolerance;Decreased balance;Decreased mobility;Decreased knowledge of precautions  PT Treatment Interventions Balance training;Gait training;Stair training;Functional mobility training;Therapeutic activities;Therapeutic exercise;Patient/family education   PT Goals (Current goals can be found in the Care Plan section) Acute Rehab PT Goals Patient Stated Goal: to go home PT Goal Formulation: With patient/family Time For Goal Achievement: 01/22/15 Potential to Achieve Goals: Good    Frequency Min 4X/week   Barriers to discharge Decreased caregiver support Pt lives alone    Co-evaluation               End of Session Equipment Utilized During Treatment: Gait belt;Oxygen Activity Tolerance: Patient limited by fatigue Patient left: in bed;with call bell/phone within reach;with family/visitor present Nurse Communication: Mobility status;Precautions         Time: [id number] PT Time Calculation (min) (ACUTE ONLY): 28 min   Charges:   PT Evaluation $Initial PT Evaluation Tier I: 1 Procedure PT Treatments $Gait Training: 8-22 mins   PT G Codes:        Brynn Reznik A Adasha Boehme 01/08/2015, 4:07 PM  [name], [place], DPT [phone]

## 2015-01-09 ENCOUNTER — Inpatient Hospital Stay (HOSPITAL_COMMUNITY): Payer: Commercial Managed Care - HMO

## 2015-01-09 LAB — CBC
HCT: 30.3 % — ABNORMAL LOW (ref 36.0–46.0)
Hemoglobin: 9.8 g/dL — ABNORMAL LOW (ref 12.0–15.0)
MCH: 30.6 pg (ref 26.0–34.0)
MCHC: 32.3 g/dL (ref 30.0–36.0)
MCV: 94.7 fL (ref 78.0–100.0)
Platelets: 260 10*3/uL (ref 150–400)
RBC: 3.2 MIL/uL — ABNORMAL LOW (ref 3.87–5.11)
RDW: 12.9 % (ref 11.5–15.5)
WBC: 5.5 10*3/uL (ref 4.0–10.5)

## 2015-01-09 LAB — BASIC METABOLIC PANEL
Anion gap: 5 (ref 5–15)
BUN: 11 mg/dL (ref 6–20)
CO2: 30 mmol/L (ref 22–32)
Calcium: 10 mg/dL (ref 8.9–10.3)
Chloride: 102 mmol/L (ref 101–111)
Creatinine, Ser: 0.67 mg/dL (ref 0.44–1.00)
GFR calc Af Amer: >60 mL/min (ref >60)
GFR calc non Af Amer: >60 mL/min (ref >60)
Glucose, Bld: 132 mg/dL — ABNORMAL HIGH (ref 65–99)
Potassium: 4.5 mmol/L (ref 3.5–5.1)
Sodium: 137 mmol/L (ref 135–145)

## 2015-01-09 LAB — TSH: TSH: 5.641 u[IU]/mL — ABNORMAL HIGH (ref 0.350–4.500)

## 2015-01-09 MED ORDER — FUROSEMIDE 40 MG PO TABS
40.0000 mg | ORAL_TABLET | Freq: Every day | ORAL | Status: DC
  Administered 2015-01-09 – 2015-01-14 (×6): 40 mg via ORAL
  Filled 2015-01-09 (×6): qty 1

## 2015-01-09 MED ORDER — LEVALBUTEROL HCL 0.63 MG/3ML IN NEBU: 0.6300 mg | INHALATION_SOLUTION | RESPIRATORY_TRACT | Status: DC | PRN

## 2015-01-09 NOTE — Progress Notes (Signed)
CARDIAC REHAB PHASE I   PRE:  Rate/Rhythm: paced 75  BP:  Supine:   Sitting: 107/63  Standing:    SaO2: 89-92% 1L  MODE:  Ambulation: 300 ft   POST:  Rate/Rhythm: 81  BP:  Supine:   Sitting: 126/85  Standing:    SaO2: 86% 2L hall, 97% 3L    Back to 1L in room resting 1326-1410 Pt assisted to bathroom and then walked 300 ft with rolling walker and asst x 1. Started out on 2L and increased to 3L to keep sats up. Tolerated well. Rested after 150 ft standing and then walked another 150 ft. Encouraged OOB to recliner with call bell. Back to 1L. Encouraged IS.    Graylon Good, RN BSN  01/09/2015 2:06 PM

## 2015-01-09 NOTE — Progress Notes (Addendum)
SUBJECTIVE:  No complaints  OBJECTIVE:   Vitals:   Filed Vitals:   01/08/15 1434 01/08/15 2017 01/08/15 2113 01/09/15 0439  BP:   121/58 121/57  Pulse:   84 84  Temp:   98.8 F (37.1 C) 98.6 F (37 C)  TempSrc:   Oral Oral  Resp:   18 18  Height:      Weight:    68.1 kg (150 lb 2.1 oz)  SpO2: 96% 96% 97% 94%   I&O's:   Intake/Output Summary (Last 24 hours) at 01/09/15 0835 Last data filed at 01/09/15 0700  Gross per 24 hour  Intake    383 ml  Output   1350 ml  Net   -967 ml   TELEMETRY: Reviewed telemetry pt in A paced:     PHYSICAL EXAM General: Well developed, well nourished, in no acute distress Head: Eyes PERRLA, No xanthomas.   Normal cephalic and atramatic  Lungs:   Clear bilaterally to auscultation and percussion. Heart:   HRRR S1 S2 Pulses are 2+ & equal. Abdomen: Bowel sounds are positive, abdomen soft and non-tender without masses Extremities:   No clubbing, cyanosis or edema.  DP +1 Neuro: Alert and oriented X 3. Psych:  Good affect, responds appropriately   LABS: Basic Metabolic Panel:  Recent Labs  01/08/15 0505 01/09/15 0225  NA 133* 137  K 5.0 4.5  CL 100* 102  CO2 27 30  GLUCOSE 119* 132*  BUN 15 11  CREATININE 0.84 0.67  CALCIUM 9.8 10.0   Liver Function Tests: No results for input(s): AST, ALT, ALKPHOS, BILITOT, PROT, ALBUMIN in the last 72 hours. No results for input(s): LIPASE, AMYLASE in the last 72 hours. CBC:  Recent Labs  01/08/15 0505 01/09/15 0225  WBC 7.2 5.5  HGB 10.3* 9.8*  HCT 31.9* 30.3*  MCV 95.8 94.7  PLT 230 260   Cardiac Enzymes: No results for input(s): CKTOTAL, CKMB, CKMBINDEX, TROPONINI in the last 72 hours. BNP: Invalid input(s): POCBNP D-Dimer: No results for input(s): DDIMER in the last 72 hours. Hemoglobin A1C: No results for input(s): HGBA1C in the last 72 hours. Fasting Lipid Panel: No results for input(s): CHOL, HDL, LDLCALC, TRIG, CHOLHDL, LDLDIRECT in the last 72 hours. Thyroid  Function Tests: No results for input(s): TSH, T4TOTAL, T3FREE, THYROIDAB in the last 72 hours.  Invalid input(s): FREET3 Anemia Panel: No results for input(s): VITAMINB12, FOLATE, FERRITIN, TIBC, IRON, RETICCTPCT in the last 72 hours. Coag Panel:   Lab Results  Component Value Date   INR 1.37 01/04/2015   INR 1.05 01/03/2015   INR 1.74* 12/28/2014    RADIOLOGY: Dg Chest 2 View  01/02/2015  CLINICAL DATA:  Preop EXAM: CHEST  2 VIEW COMPARISON:  06/03/2012 FINDINGS: Cardiomediastinal silhouette is stable. Dual lead cardiac pacemaker is unchanged in position. No acute infiltrate or pleural effusion. No pulmonary edema. Bony thorax is unremarkable. IMPRESSION: No active cardiopulmonary disease. Electronically Signed   By: Lahoma Crocker M.D.   On: 01/02/2015 08:43   Dg Chest Port 1 View  01/08/2015  CLINICAL DATA:  Recent CABG. EXAM: PORTABLE CHEST 1 VIEW COMPARISON:  01/07/2015 FINDINGS: Sequelae of CABG and left atrial appendage clipping are again identified. Cardiac silhouette remains enlarged. Right jugular sheath is unchanged. Left subclavian approach dual lead pacemaker remains in place. Small bilateral pleural effusions have not significantly changed. There are bibasilar parenchymal lung opacities with mild interstitial prominence, also not significantly changed. No pneumothorax is identified. IMPRESSION: 1. Unchanged support devices. 2.  Cardiomegaly with unchanged pleural effusions, atelectasis, and possibly mild interstitial edema. Electronically Signed   By: Logan Bores M.D.   On: 01/08/2015 08:11   Dg Chest Port 1 View  01/07/2015  CLINICAL DATA:  CABG. EXAM: PORTABLE CHEST 1 VIEW COMPARISON:  01/06/2015. FINDINGS: Right IJ sheath in stable position. Prior CABG. Cardiac pacer and lead tips in right atrium and right ventricle. Left atrial appendage clip. Cardiomegaly with bilateral pulmonary interstitial prominence and bilateral effusions. Findings consistent with congestive heart failure.  Findings have increased slightly from prior exam . Low lung volumes with basilar atelectasis. No pneumothorax. Left breast implant. IMPRESSION: 1. Right IJ sheath in stable position. 2. Prior CABG. Cardiac pacer and lead tips in right atrium and right ventricle. Left ventricular appendage clip noted. 3. Cardiomegaly with bilateral pulmonary interstitial prominence and bilateral pleural effusions. Findings consistent with congestive heart failure. Findings have increased slightly from prior exam. 4. Low lung volumes with basilar atelectasis . Electronically Signed   By: Marcello Moores  Register   On: 01/07/2015 07:14   Dg Chest Port 1 View  01/06/2015  CLINICAL DATA:  Post CABG EXAM: PORTABLE CHEST 1 VIEW COMPARISON:  01/05/2015 FINDINGS: Left-sided pacemaker and midline sternotomy wires overlies normal cardiac silhouette. Interval retraction of Swan-Ganz catheter. Removal of LEFT chest tube without pneumothorax. Atrial clip noted. Mediastinal drain removed. Persistent bilateral small effusions and basilar atelectasis are slightly worsened. IMPRESSION: Mild increase in bilateral basilar effusions and atelectasis. Removal of LEFT chest tube without pneumothorax. Electronically Signed   By: Suzy Bouchard M.D.   On: 01/06/2015 07:34   Dg Chest Port 1 View  01/05/2015  CLINICAL DATA:  CABG 01/04/2015 EXAM: PORTABLE CHEST 1 VIEW COMPARISON:  01/04/2015 FINDINGS: Interval extubation with no change in LEFT basilar atelectasis. No new atelectasis. Swan-Ganz catheter remains unchanged. LEFT chest tube and mediastinal drain are unchanged. Removal of NG tube. Atrial clip noted. No pneumothorax. IMPRESSION: 1. Interval extubation without complication. 2. Stable remaining support apparatus. 3. LEFT chest tube in place without pneumothorax. 4. LEFT basilar atelectasis small effusion. Electronically Signed   By: Suzy Bouchard M.D.   On: 01/05/2015 10:09   Dg Chest Port 1 View  01/04/2015  CLINICAL DATA:  CABG EXAM:  PORTABLE CHEST 1 VIEW COMPARISON:  01/02/2015 FINDINGS: Postop CABG. Endotracheal tube is high at T2 and could be advanced approximately 3 cm. NG tube in the stomach. Swan-Ganz catheter tip in the right main pulmonary artery. Left chest tube in place. No pneumothorax. Left lower lobe atelectasis and small left effusion. Negative for heart failure. IMPRESSION: Endotracheal tube is high and could be advanced 3 cm Left lower lobe atelectasis and effusion.  No pneumothorax Electronically Signed   By: Franchot Gallo M.D.   On: 01/04/2015 13:17    ASSESSMENT AND PLAN:  1. Unstable angina pectoris - cath showed severe multivessel CAD  2. ASCAD multivessel s/p 3 vessel CABG POD #3. Continue ASA/statin/BB.  3. Paroxysmal atrial fibrillation - had post op afib on 11/26 and then converted to NSR on PO Amio/BB. Went back into atrial fibrillation with RVR. She was given Amio bolus 150mg  and Amio increased to 400mg  BID in hopes that she will maintain NSR. CHADSVasc score is at least 5 (age, gender, hypertension, vascular disease). Timing of starting anticoagulation per CVTS. LFTs ok.  Check TSH.  PFTs with moderately reduced DLCO.  Hopefully will be able to get off Amio 3 months out from CABG. Continue Amio load with 400mg  BID.At discharge decrease to 400mg  daily  for 2 weeks then 200mg  daily.   Follow daily QTc.  She has an external pacer that was turned up to 80bpm most likely to suppress PAF.  Please let us know if you would like her PPM set at 80bpm for a while post op and we can interrogate and adjust rate.  4. Severe sinus bradycardia with normally functioning dual-chamber permanent pacemaker (medtronic).  5. Peripheral arterial disease status post stents to both superficial femoral arteries in 2012, currently asymptomatic  6. Hyperlipidemia. Started on statin this hospitalization.     Sueanne Margarita, MD  01/09/2015  8:35 AM

## 2015-01-09 NOTE — Progress Notes (Signed)
Physical Therapy Treatment Patient Details Name: [name] MRN: [id number] DOB: January 20, 1941 Today's Date: 01/09/2015    History of Present Illness Patient is a 74 y/o female s/p CABG x3 and clipping of atrial appendage. PMH includes HTN, CAD, PAD, PVD, mitral regurgitation, pacemaker, PAF.    PT Comments    Patient progressing well towards PT goals. Continues to have some difficulty with bed mobility due to sternal precautions. Improved ambulation distance but weakness and drifting noted when fatigued. Ambulating 300' with Min guard assist for safety. Encouraged sitting in chair as much as tolerated. Will plan for stair training tomorrow as tolerated. Will follow acutely.   Follow Up Recommendations  Home health PT;Supervision - Intermittent     Equipment Recommendations  None recommended by PT    Recommendations for Other Services       Precautions / Restrictions Precautions Precautions: Sternal Restrictions Weight Bearing Restrictions: No    Mobility  Bed Mobility Overal bed mobility: Needs Assistance Bed Mobility: Rolling;Sidelying to Sit;Sit to Sidelying Rolling: Min assist Sidelying to sit: Mod assist Supine to sit: Mod assist;HOB elevated   Sit to sidelying: Supervision General bed mobility comments: Mod A to elevate trunk. Pt able to bring LEs to EOB without assist. Cues to adhere to sternal precautions. Rolling x2. Sidelying to/from sit x2.  Transfers Overall transfer level: Needs assistance Equipment used: Rolling walker (2 wheeled) Transfers: Sit to/from Stand Sit to Stand: Supervision         General transfer comment: Supervision for safety. Stood without use of UEs- required cues for technique.  Ambulation/Gait Ambulation/Gait assistance: Min guard Ambulation Distance (Feet): 300 Feet Assistive device: Rolling walker (2 wheeled) Gait Pattern/deviations: Step-through pattern;Decreased stride length;Drifts right/left Gait velocity: decreased Gait  velocity interpretation: <1.8 ft/sec, indicative of risk for recurrent falls General Gait Details: Slow, mildly unsteady gait with some drifting noted towards end of ambulation bout with head turns but no LOB. DOE. Difficult to obtain Sp02 due to cold fingers.    Stairs            Wheelchair Mobility    Modified Rankin (Stroke Patients Only)       Balance Overall balance assessment: Needs assistance Sitting-balance support: Feet supported;No upper extremity supported Sitting balance-Leahy Scale: Good     Standing balance support: During functional activity Standing balance-Leahy Scale: Fair                      Cognition Arousal/Alertness: Awake/alert Behavior During Therapy: WFL for tasks assessed/performed Overall Cognitive Status: Within Functional Limits for tasks assessed                      Exercises      General Comments General comments (skin integrity, edema, etc.): Friend present in room during session.      Pertinent Vitals/Pain Pain Assessment: Faces Faces Pain Scale: Hurts a little bit Pain Location: incision when laying in supine Pain Descriptors / Indicators: Sore Pain Intervention(s): Monitored during session;Repositioned    Home Living                      Prior Function            PT Goals (current goals can now be found in the care plan section) Progress towards PT goals: Progressing toward goals    Frequency  Min 4X/week    PT Plan Current plan remains appropriate    Co-evaluation  End of Session Equipment Utilized During Treatment: Gait belt;Oxygen Activity Tolerance: Patient tolerated treatment well Patient left: in bed;with call bell/phone within reach;with family/visitor present;with SCD's reapplied     Time: [id number] PT Time Calculation (min) (ACUTE ONLY): 24 min  Charges:  $Gait Training: 8-22 mins $Therapeutic Activity: 8-22 mins                    G Codes:      Kyros Salzwedel  A Liana Camerer 01/09/2015, 10:08 AM [name], PT, DPT [phone]

## 2015-01-09 NOTE — Progress Notes (Addendum)
      [address]       [place] [address]             [phone]      5 Days Post-Op Procedure(s) (LRB): CORONARY ARTERY BYPASS GRAFTING (CABG) x 3 using left internal mammory artery and greater saphenous vein right leg harvested endoscopically. (N/A) TRANSESOPHAGEAL ECHOCARDIOGRAM (TEE) (N/A) CLIPPING OF ATRIAL APPENDAGE (N/A)  Subjective: She is about to go for her chest x ray. She has no specific complaints this am.  Objective: Vital signs in last 24 hours: Temp:  [98.2 F (36.8 C)-98.8 F (37.1 C)] 98.6 F (37 C) (11/30 0439) Pulse Rate:  [84] 84 (11/30 0439) Cardiac Rhythm:  [-] Atrial paced (11/30 0719) Resp:  [15-20] 18 (11/30 0439) BP: (112-141)/(57-81) 121/57 mmHg (11/30 0439) SpO2:  [90 %-100 %] 94 % (11/30 0439) Weight:  [150 lb 2.1 oz (68.1 kg)] 150 lb 2.1 oz (68.1 kg) (11/30 0439)  Pre op weight 64 kg Current Weight  01/09/15 150 lb 2.1 oz (68.1 kg)      Intake/Output from previous day: 11/29 0701 - 11/30 0700 In: 403 [P.O.:360; I.V.:43] Out: 1700 [Urine:1700]   Physical Exam:  Cardiovascular: IRRR IRRR Pulmonary: Slightly diminished; no rales, wheezes, or rhonchi. Abdomen: Soft, non tender, bowel sounds present. Extremities: Mild bilateral lower extremity edema. Wounds: Clean and dry.  No erythema or signs of infection.  Lab Results: CBC: Recent Labs  01/08/15 0505 01/09/15 0225  WBC 7.2 5.5  HGB 10.3* 9.8*  HCT 31.9* 30.3*  PLT 230 260   BMET:  Recent Labs  01/08/15 0505 01/09/15 0225  NA 133* 137  K 5.0 4.5  CL 100* 102  CO2 27 30  GLUCOSE 119* 132*  BUN 15 11  CREATININE 0.84 0.67  CALCIUM 9.8 10.0    PT/INR:  Lab Results  Component Value Date   INR 1.37 01/04/2015   INR 1.05 01/03/2015   INR 1.74* 12/28/2014   ABG:  INR: Will add last result for INR, ABG once components are confirmed Will add last 4 CBG results once components are confirmed  Assessment/Plan:  1. CV - Paced (has PPM), PAF. On  Amiodarone 400 mg bid, Lopressor 12.5 mg bid. Will likely need anticoagulation. Cardiology following. 2.  Pulmonary - On 2 liters of oxygen via [place]. Wean as tolerates. CXR ordered. Encourage incentive spirometer 3. Volume Overload - Will give Lasix 40 mg today 4.  Acute blood loss anemia - H and H this am 9.8 and 30.3 5. Has PPM and still with EPW. Hope to remove EPW later today.  [name],[name] 01/09/2015,8:18 AM Holding sinus, atrial clip in place Pacer check  today, increase rate to 70's , d/c external wires , start [place] one to two days I have seen and examined [name] and agree with the above assessment  and plan.  Grace Isaac MD Beeper [phone] Office [phone] 01/09/2015 9:28 AM

## 2015-01-09 NOTE — Consult Note (Signed)
   [facility] CM Inpatient Consult   01/09/2015  ANYELY CUNNING [birth year] [phone] Patient evaluated for community based chronic disease management services with [place] [facility] as a benefit of patient's [facility] Medicare Insurance. Met with patient at bedside to explain [place] Management services.  Patient is a S/P CABG x 3.  Patient lives alone, daughter is support contact.   Patient will receive post hospital discharge call and will be evaluated for monthly home visits for assessments and disease process education.  Left contact information and [facility] literature at bedside. Made Inpatient Case Manager aware that [place] Management following. Of note, [facility] Care Management services does not replace or interfere with any services that are arranged by inpatient case management or social work.  For additional questions or referrals please contact:   Natividad Brood, RN BSN [facility] Liaison  [phone] business mobile phone

## 2015-01-10 ENCOUNTER — Encounter (HOSPITAL_COMMUNITY): Payer: Self-pay | Admitting: Cardiothoracic Surgery

## 2015-01-10 ENCOUNTER — Other Ambulatory Visit: Payer: Self-pay

## 2015-01-10 DIAGNOSIS — Z951 Presence of aortocoronary bypass graft: Secondary | ICD-10-CM

## 2015-01-10 LAB — T4, FREE: Free T4: 1.02 ng/dL (ref 0.61–1.12)

## 2015-01-10 MED ORDER — ASPIRIN EC 81 MG PO TBEC
81.0000 mg | DELAYED_RELEASE_TABLET | Freq: Every day | ORAL | Status: DC
  Administered 2015-01-10 – 2015-01-14 (×5): 81 mg via ORAL
  Filled 2015-01-10 (×5): qty 1

## 2015-01-10 MED ORDER — AMIODARONE IV BOLUS ONLY 150 MG/100ML
150.0000 mg | Freq: Once | INTRAVENOUS | Status: AC
  Administered 2015-01-10: 150 mg via INTRAVENOUS
  Filled 2015-01-10 (×2): qty 100

## 2015-01-10 MED ORDER — METOPROLOL TARTRATE 1 MG/ML IV SOLN
2.5000 mg | Freq: Once | INTRAVENOUS | Status: AC
  Administered 2015-01-10: 2.5 mg via INTRAVENOUS
  Filled 2015-01-10: qty 5

## 2015-01-10 MED ORDER — RIVAROXABAN 20 MG PO TABS
20.0000 mg | ORAL_TABLET | Freq: Every day | ORAL | Status: DC
Start: 2015-01-10 — End: 2015-01-14
  Administered 2015-01-10 – 2015-01-13 (×4): 20 mg via ORAL
  Filled 2015-01-10 (×4): qty 1

## 2015-01-10 MED ORDER — RIVAROXABAN 20 MG PO TABS: 20.0000 mg | ORAL_TABLET | Freq: Every day | ORAL | Status: DC

## 2015-01-10 NOTE — Progress Notes (Addendum)
      [address]       [place] [address]             [phone]      6 Days Post-Op Procedure(s) (LRB): CORONARY ARTERY BYPASS GRAFTING (CABG) x 3 using left internal mammory artery and greater saphenous vein right leg harvested endoscopically. (N/A) TRANSESOPHAGEAL ECHOCARDIOGRAM (TEE) (N/A) CLIPPING OF ATRIAL APPENDAGE (N/A)  Subjective: She does not feel all that well this am. Has some shortness of breath.  Objective: Vital signs in last 24 hours: Temp:  [97.8 F (36.6 C)-98.8 F (37.1 C)] 98.8 F (37.1 C) (12/01 0457) Pulse Rate:  [56-109] 104 (12/01 0600) Cardiac Rhythm:  [-] Atrial fibrillation (12/01 0751) Resp:  [16-18] 16 (12/01 0457) BP: (105-134)/(60-89) 129/76 mmHg (12/01 0600) SpO2:  [93 %-96 %] 93 % (12/01 0457) Weight:  [150 lb 3.2 oz (68.13 kg)] 150 lb 3.2 oz (68.13 kg) (12/01 0457)  Pre op weight 64 kg Current Weight  01/10/15 150 lb 3.2 oz (68.13 kg)      Intake/Output from previous day: 11/30 0701 - 12/01 0700 In: 120 [P.O.:120] Out: 1400 [Urine:1400]   Physical Exam:  Cardiovascular: IRRR IRRR Pulmonary: Slightly diminished; no rales, wheezes, or rhonchi. Abdomen: Soft, non tender, bowel sounds present. Extremities: Mild bilateral lower extremity edema. Wounds: Clean and dry.  No erythema or signs of infection.  Lab Results: CBC:  Recent Labs  01/08/15 0505 01/09/15 0225  WBC 7.2 5.5  HGB 10.3* 9.8*  HCT 31.9* 30.3*  PLT 230 260   BMET:   Recent Labs  01/08/15 0505 01/09/15 0225  NA 133* 137  K 5.0 4.5  CL 100* 102  CO2 27 30  GLUCOSE 119* 132*  BUN 15 11  CREATININE 0.84 0.67  CALCIUM 9.8 10.0    PT/INR:  Lab Results  Component Value Date   INR 1.37 01/04/2015   INR 1.05 01/03/2015   INR 1.74* 12/28/2014   ABG:  INR: Will add last result for INR, ABG once components are confirmed Will add last 4 CBG results once components are confirmed  Assessment/Plan:  1. CV - PAF. Intrinsic PPM HR  increased to 75 yesterday.On Amiodarone 400 mg bid, Lopressor 12.5 mg bid. Will restart Xarelto this am. Cardiology ordered one dose of Amiodarone bolus to try to convert to SR. 2.  Pulmonary - On 2 liters of oxygen via [place]. Wean as tolerates.  Encourage incentive spirometer 3. Volume Overload - ContinueLasix 40 mg today 4.  Acute blood loss anemia - H and H this am 9.8 and 30.3 5. Continue with CRPI  [name],[name] MPA-C 01/10/2015,8:20 AM  Back in afib this am Starting xarleto today I have seen and examined [name] and agree with the above assessment  and plan.  Grace Isaac MD Beeper [phone] Office [phone] 01/10/2015 8:58 AM

## 2015-01-10 NOTE — Discharge Instructions (Signed)
Coronary Artery Bypass Grafting, Care After Refer to this sheet in the next few weeks. These instructions provide you with information on caring for yourself after your procedure. Your health care provider may also give you more specific instructions. Your treatment has been planned according to current medical practices, but problems sometimes occur. Call your health care provider if you have any problems or questions after your procedure. WHAT TO EXPECT AFTER THE PROCEDURE Recovery from surgery will be different for everyone. Some people feel well after 3 or 4 weeks, while for others it takes longer. After your procedure, it is typical to have the following:  Nausea and a lack of appetite.   Constipation.  Weakness and fatigue.   Depression or irritability.   Pain or discomfort at your incision site. HOME CARE INSTRUCTIONS  Take medicines only as directed by your health care provider. Do not stop taking medicines or start any new medicines without first checking with your health care provider.  Take your pulse as directed by your health care provider.  Perform deep breathing as directed by your health care provider. If you were given a device called an incentive spirometer, use it to practice deep breathing several times a day. Support your chest with a pillow or your arms when you take deep breaths or cough.  Keep incision areas clean, dry, and protected. Remove or change any bandages (dressings) only as directed by your health care provider. You may have skin adhesive strips over the incision areas. Do not take the strips off. They will fall off on their own.  Check incision areas daily for any swelling, redness, or drainage.  If incisions were made in your legs, do the following:  Avoid crossing your legs.   Avoid sitting for long periods of time. Change positions every 30 minutes.   Elevate your legs when you are sitting.  Wear compression stockings as directed by your  health care provider. These stockings help keep blood clots from forming in your legs.  Take showers once your health care provider approves. Until then, only take sponge baths. Pat incisions dry. Do not rub incisions with a washcloth or towel. Do not take baths, swim, or use a hot tub until your health care provider approves.  Eat foods that are high in fiber, such as raw fruits and vegetables, whole grains, beans, and nuts. Meats should be lean cut. Avoid canned, processed, and fried foods.  Drink enough fluid to keep your urine clear or pale yellow.  Weigh yourself every day. This helps identify if you are retaining fluid that may make your heart and lungs work harder.  Rest and limit activity as directed by your health care provider. You may be instructed to:  Stop any activity at once if you have chest pain, shortness of breath, irregular heartbeats, or dizziness. Get help right away if you have any of these symptoms.  Move around frequently for short periods or take short walks as directed by your health care provider. Increase your activities gradually. You may need physical therapy or cardiac rehabilitation to help strengthen your muscles and build your endurance.  Avoid lifting, pushing, or pulling anything heavier than 10 lb (4.5 kg) for at least 6 weeks after surgery.  Do not drive until your health care provider approves.  Ask your health care provider when you may return to work.  Ask your health care provider when you may resume sexual activity.  Keep all follow-up visits as directed by your health care  provider. This is important. °SEEK MEDICAL CARE IF: °· You have swelling, redness, increasing pain, or drainage at the site of an incision. °· You have a fever. °· You have swelling in your ankles or legs. °· You have pain in your legs.   °· You gain 2 or more pounds (0.9 kg) a day. °· You are nauseous or vomit. °· You have diarrhea.  °SEEK IMMEDIATE MEDICAL CARE IF: °· You have  chest pain that goes to your jaw or arms. °· You have shortness of breath.   °· You have a fast or irregular heartbeat.   °· You notice a "clicking" in your breastbone (sternum) when you move.   °· You have numbness or weakness in your arms or legs. °· You feel dizzy or light-headed.   °MAKE SURE YOU: °· Understand these instructions. °· Will watch your condition. °· Will get help right away if you are not doing well or get worse. °  °This information is not intended to replace advice given to you by your health care provider. Make sure you discuss any questions you have with your health care provider. °  °Document Released: 08/15/2004 Document Revised: 02/16/2014 Document Reviewed: 07/05/2012 °Elsevier Interactive Patient Education ©2016 [employer] ° °Endoscopic Saphenous Vein Harvesting, Care After °Refer to this sheet in the next few weeks. These instructions provide you with information on caring for yourself after your procedure. Your health care provider may also give you more specific instructions. Your treatment has been planned according to current medical practices, but problems sometimes occur. Call your health care provider if you have any problems or questions after your procedure. °HOME CARE INSTRUCTIONS °Medicine °· Take whatever pain medicine your surgeon prescribes. Follow the directions carefully. Do not take over-the-counter pain medicine unless your surgeon says it is okay. Some pain medicine can cause bleeding problems for several weeks after surgery. °· Follow your surgeon's instructions about driving. You will probably not be permitted to drive after heart surgery. °· Take any medicines your surgeon prescribes. Any medicines you took before your heart surgery should be checked with your health care provider before you start taking them again. °Wound care °· If your surgeon has prescribed an elastic bandage or stocking, ask how long you should wear it. °· Check the area around your surgical  cuts (incisions) whenever your bandages (dressings) are changed. Look for any redness or swelling. °· You will need to return to have the stitches (sutures) or staples taken out. Ask your surgeon when to do that. °· Ask your surgeon when you can shower or bathe. °Activity °· Try to keep your legs raised when you are sitting. °· Do any exercises your health care providers have given you. These may include deep breathing exercises, coughing, walking, or other exercises. °SEEK MEDICAL CARE IF: °· You have any questions about your medicines. °· You have more leg pain, especially if your pain medicine stops working. °· New or growing bruises develop on your leg. °· Your leg swells, feels tight, or becomes red. °· You have numbness in your leg. °SEEK IMMEDIATE MEDICAL CARE IF: °· Your pain gets much worse. °· Blood or fluid leaks from any of the incisions. °· Your incisions become warm, swollen, or red. °· You have chest pain. °· You have trouble breathing. °· You have a fever. °· You have more pain near your leg incision. °MAKE SURE YOU: °· Understand these instructions. °· Will watch your condition. °· Will get help right away if you are not doing well or   get worse.   This information is not intended to replace advice given to you by your health care provider. Make sure you discuss any questions you have with your health care provider.   Document Released: 10/08/2010 Document Revised: 02/16/2014 Document Reviewed: 10/08/2010 Elsevier Interactive Patient Education 2016 [place] on my medicine - XARELTO (Rivaroxaban)  This medication education was reviewed with me or my healthcare representative as part of my discharge preparation.  The pharmacist that spoke with me during my hospital stay was:  Romona Curls, [facility]  Why was Xarelto prescribed for you? Xarelto was prescribed for you to reduce the risk of a blood clot forming that can cause a stroke if you have a medical condition called  atrial fibrillation (a type of irregular heartbeat).  What do you need to know about xarelto ? Take your Xarelto ONCE DAILY at the same time every day with your evening meal. If you have difficulty swallowing the tablet whole, you may crush it and mix in applesauce just prior to taking your dose.  Take Xarelto exactly as prescribed by your doctor and DO NOT stop taking Xarelto without talking to the doctor who prescribed the medication.  Stopping without other stroke prevention medication to take the place of Xarelto may increase your risk of developing a clot that causes a stroke.  Refill your prescription before you run out.  After discharge, you should have regular check-up appointments with your healthcare provider that is prescribing your Xarelto.  In the future your dose may need to be changed if your kidney function or weight changes by a significant amount.  What do you do if you miss a dose? If you are taking Xarelto ONCE DAILY and you miss a dose, take it as soon as you remember on the same day then continue your regularly scheduled once daily regimen the next day. Do not take two doses of Xarelto at the same time or on the same day.   Important Safety Information A possible side effect of Xarelto is bleeding. You should call your healthcare provider right away if you experience any of the following: ? Bleeding from an injury or your nose that does not stop. ? Unusual colored urine (red or dark brown) or unusual colored stools (red or black). ? Unusual bruising for unknown reasons. ? A serious fall or if you hit your head (even if there is no bleeding).  Some medicines may interact with Xarelto and might increase your risk of bleeding while on Xarelto. To help avoid this, consult your healthcare provider or pharmacist prior to using any new prescription or non-prescription medications, including herbals, vitamins, non-steroidal anti-inflammatory drugs (NSAIDs) and  supplements.  This website has more information on Xarelto: [email or web address]

## 2015-01-10 NOTE — Progress Notes (Signed)
Physical Therapy Treatment Patient Details Name: [name] MRN: [id number] DOB: 03/17/1940 Today's Date: 01/10/2015    History of Present Illness Patient is a 74 y/o female s/p CABG x3 and clipping of atrial appendage. PMH includes HTN, CAD, PAD, PVD, mitral regurgitation, pacemaker, PAF.    PT Comments    Patient feeling slightly better this AM however still in A-fib. Tolerated gait training and there ex this session. Continues to have drop in Sp02 with mobility on supplemental oxygen. Deferred stair training today as pt not feeling well. Will plan for stair training tomorrow as tolerated. Will follow acutely.   Follow Up Recommendations  Home health PT;Supervision - Intermittent     Equipment Recommendations  None recommended by PT    Recommendations for Other Services       Precautions / Restrictions Precautions Precautions: Sternal Restrictions Weight Bearing Restrictions: No    Mobility  Bed Mobility Overal bed mobility: Needs Assistance Bed Mobility: Supine to Sit;Rolling;Sit to Sidelying Rolling: Supervision   Supine to sit: Mod assist   Sit to sidelying: Supervision General bed mobility comments: Mod A to elevate trunk. Pt able to bring LEs to EOB without assist. Cues to adhere to sternal precautions. Rolling x1. Able to return to supine without assist.   Transfers Overall transfer level: Needs assistance Equipment used: Rolling walker (2 wheeled) Transfers: Sit to/from Stand Sit to Stand: Supervision         General transfer comment: Supervision for safety. Reminders not to use uEs to stand.   Ambulation/Gait Ambulation/Gait assistance: Min guard Ambulation Distance (Feet): 150 Feet Assistive device: Rolling walker (2 wheeled) Gait Pattern/deviations: Step-through pattern;Decreased stride length;Drifts right/left Gait velocity: decreased   General Gait Details: Slow, mildly unsteady gait with some drifting noted. Cues for RW proximity and upright  posture. Sp02 dropped to 87% on 2L/min 02. HR ranged from 107-135 bpm A-fib.    Stairs            Wheelchair Mobility    Modified Rankin (Stroke Patients Only)       Balance Overall balance assessment: Needs assistance Sitting-balance support: Feet supported;No upper extremity supported Sitting balance-Leahy Scale: Good     Standing balance support: During functional activity Standing balance-Leahy Scale: Fair                      Cognition Arousal/Alertness: Awake/alert Behavior During Therapy: WFL for tasks assessed/performed Overall Cognitive Status: Within Functional Limits for tasks assessed                      Exercises General Exercises - Lower Extremity Long Arc Quad: Both;20 reps;Seated Toe Raises: Both;20 reps;Seated Heel Raises: Both;20 reps;Seated Other Exercises Other Exercises: sit to stand x7 without use of UEs    General Comments General comments (skin integrity, edema, etc.): Friend present in room towards end of session.      Pertinent Vitals/Pain Pain Assessment: No/denies pain    Home Living                      Prior Function            PT Goals (current goals can now be found in the care plan section) Progress towards PT goals: Progressing toward goals    Frequency  Min 4X/week    PT Plan Current plan remains appropriate    Co-evaluation             End of Session  Equipment Utilized During Treatment: Gait belt;Oxygen Activity Tolerance: Patient tolerated treatment well Patient left: in bed;with call bell/phone within reach;with family/visitor present     Time: 1350-1410 PT Time Calculation (min) (ACUTE ONLY): 20 min  Charges:  $Gait Training: 8-22 mins                    G Codes:      [name] A [name] 01/10/2015, 2:31 PM  [name], [name], DPT [phone]

## 2015-01-10 NOTE — Progress Notes (Signed)
Utilization review completed.  

## 2015-01-10 NOTE — Discharge Summary (Signed)
Physician Discharge Summary  Patient ID: CLAREE MCINERNY MRN: [id number] DOB/AGE: 1940-08-17 74 y.o.  Admit date: 01/01/2015 Discharge date: 01/14/2015  Admission Diagnoses:  Patient Active Problem List   Diagnosis Date Noted  . Atrial fibrillation with RVR ([place]) 01/11/2015  . S/P CABG x 3 01/10/2015  . CAD (coronary artery disease) 01/04/2015  . Accelerating angina ([place]) 01/01/2015  . Unstable angina pectoris ([place]) 12/28/2014  . Symptomatic sinus bradycardia 01/22/2012  . PAF (paroxysmal atrial fibrillation) ([place]) 01/22/2012  . Sinus node dysfunction ([place]) 01/22/2012  . S/P placement of cardiac pacemaker, medtronic adapta 01/21/12 01/22/2012  . PVD (peripheral vascular disease), hx stents to bil SFAs 02/2010 01/22/2012  . GLUCOSE INTOLERANCE 12/31/2009  . HYPERLIPIDEMIA 12/31/2009  . ANXIETY 12/31/2009  . DEPRESSION 12/31/2009  . MITRAL REGURGITATION 12/31/2009  . Essential hypertension 12/31/2009  . Coronary atherosclerosis 12/31/2009  . MITRAL VALVE PROLAPSE 12/31/2009  . MENOPAUSE, EARLY 12/31/2009  . OSTEOARTHRITIS, HAND 12/31/2009  . [place] DISEASE, CERVICAL 12/31/2009  . [place] DISEASE, LUMBAR 12/31/2009  . UNSPECIFIED URINARY INCONTINENCE 12/31/2009  . ABDOMINAL PAIN, LOWER 12/31/2009  . DIVERTICULITIS, HX OF 12/31/2009   Discharge Diagnoses:   Patient Active Problem List   Diagnosis Date Noted  . CAD (coronary artery disease) 01/04/2015  . Accelerating angina ([place]) 01/01/2015  . Unstable angina pectoris ([place]) 12/28/2014  . Symptomatic sinus bradycardia 01/22/2012  . PAF (paroxysmal atrial fibrillation) ([place]) 01/22/2012  . Sinus node dysfunction ([place]) 01/22/2012  . S/P placement of cardiac pacemaker, medtronic adapta 01/21/12 01/22/2012  . PVD (peripheral vascular disease), hx stents to bil SFAs 02/2010 01/22/2012  . GLUCOSE INTOLERANCE 12/31/2009  . HYPERLIPIDEMIA 12/31/2009  . ANXIETY 12/31/2009  . DEPRESSION 12/31/2009  . MITRAL REGURGITATION 12/31/2009  .  Essential hypertension 12/31/2009  . Coronary atherosclerosis 12/31/2009  . MITRAL VALVE PROLAPSE 12/31/2009  . MENOPAUSE, EARLY 12/31/2009  . OSTEOARTHRITIS, HAND 12/31/2009  . [place] DISEASE, CERVICAL 12/31/2009  . [place] DISEASE, LUMBAR 12/31/2009  . UNSPECIFIED URINARY INCONTINENCE 12/31/2009  . ABDOMINAL PAIN, LOWER 12/31/2009  . DIVERTICULITIS, HX OF 12/31/2009  Phlebitis of right antecubital fossa (previous IV)  Discharged Condition: good  History of Present Illness:  Mrs. Maden is a 74 yo white female with known history of Atrial Fibrillation on Xarelto, Tachy/Brady Syndrome S/P PPM placed in 2013, S/P Bilateral SFA Stenting by Dr. Gwenlyn Found in 2012.  The patient states she was in her usual state of health until about 6 weeks ago.  At that time she noticed a heaviness in her chest with exertion.  This was ultimately relieved with rest.  The episodes were mostly with vigorous activity while working out at the gym, but recently they have been occuring with minimal activity.  The pain radiates to her neck and jaw and she also experiences shortness of breath.  She presented for follow up with her primary Cardiologist Dr. Sallyanne Kuster who felt due to her current symptoms she should undergo cardiac catheterization.  This was performed on 01/01/2015 and revealed severe coronary artery disease with a preserved EF of 55-60%.  It was felt coronary bypass grafting would be indicated and TCTS consulted.  She was evaluated by Dr. Servando Snare who was in agreement the patient would benefit from bypass surgery.  The risks and benefits of the procedure were explained to the patient and she was agreeable to proceed.  Hospital Course:   During hospitalization the patient remained chest pain free prior to surgery.  She completed the necessary preoperative workup and was taken to the operating room  on 01/04/2015.  She underwent CABG x 3 utilizing LIMA to LAD, SVG to Ramus Intermediate, and SVG to PDA.  She also had a Clip  placed on her LA appendage utilzing a 35 mm Atricure Clip.  Finally she underwent endoscopic harvest of the greater saphenous vein from her right leg.  She tolerated the procedure without difficulty and she was taken to the SICU in stable condition.  She was extubated the evening of surgery.  During her stay in the SICU she was weaned off Dopamine as tolerated.  Her chest tubes and arterial lines were removed without difficulty.  She developed rapid Atrial Fibrillation and was subsequently treated with Amiodarone.  She later converted to NSR.  She was ambulating without significant difficulty and felt medically stable for transfer to the step down unit on POD #4.  The patient continues to make progress.  She again developed Atrial Fibrillation and was restarted on her home dose of Xarelto.  It was felt her PPM rate should be increased to help try and keep rate higher to prevent Atrial Fibrillation.  This was done and the PPM continues to function properly.  Her external pacing wires were removed without difficulty.  The patient continues to progress.  She is ambulating independently.  She continues to have PAF.  She is tolerating a heart healthy diet.  She developed a rash on her back on 12/4. Unsure of etiology. She gets a rash with Valsartan, but she is on low dose Cozaar which she took prior to surgery. Perhaps she has allergy to ARB class of medications. Cozaar has been stopped. She also had mild phlebitis of the right antecubital fossa from previous IV (Amiodarone boluses given). The area is warm, tender, swollen, and with slight erythema. She was started on Keflex. She has had ABL anemia post op. Her H and H is up to 10.2 and 31.9 today. Potassium is below four today so it has been supplemented. CXR remains stable (no pneumothorax, bilateral pleural effusions l>R). As discussed with Dr. Servando Snare, she is felt to be medically stable for discharge home today.       Consults: cardiology  Significant Diagnostic  Studies: angiography:   Coronary Findings    Dominance: Right   Left Anterior Descending   . Prox LAD to Mid LAD lesion, 80% stenosed.   . Mid LAD lesion, 25% stenosed.   Jorene Minors LAD lesion, 25% stenosed.     Ramus Intermedius   . Ramus lesion, 80% stenosed.     Left Circumflex  . Vessel is small.     Right Coronary Artery   . Mid RCA lesion, 90% stenosed.   . Dist RCA-1 lesion, 80% stenosed. Ulcerative.   . Dist RCA-2 lesion, 50% stenosed.      EXAM: CHEST 2 VIEW  COMPARISON: 01/09/2015  FINDINGS: Left chest wall pacer device is noted with lead in the right atrial appendage and right ventricle. The heart size is normal. Status post median sternotomy and CABG procedure. There is a moderate left pleural effusion which is unchanged in volume from previous exam. Similar appearance of small right pleural effusion.  IMPRESSION: Persistent bilateral pleural effusions, left greater than right.   Electronically Signed  By: Kerby Moors M.D.  On: 01/14/2015 09:23  Treatments: surgery:   Coronary artery bypass grafting x3 with left internal mammary to the left anterior descending coronary artery, reverse saphenous vein graft to the proximal posterior descending coronary artery, and reverse saphenous vein graft to large intermediate branch of  the lateral wall of the heart with right thigh and calf endovein harvesting.Placement of 35 mm Acticure Atrial Clip  Disposition: 01-Home or Self Care   Discharge Medications:   Medication List    STOP taking these medications        amLODipine 5 MG tablet  Commonly known as:  NORVASC     losartan 100 MG tablet  Commonly known as:  COZAAR     metoprolol succinate 100 MG 24 hr tablet  Commonly known as:  TOPROL-XL      TAKE these medications        amiodarone 200 MG tablet  Commonly known as:  PACERONE  Take 1 tablet (200 mg total) by mouth 2 (two) times daily. For one week then take Amiodarone 200 mg by mouth  daily thereafter     aspirin 81 MG EC tablet  Take 81 mg by mouth daily.     atorvastatin 20 MG tablet  Commonly known as:  LIPITOR  Take 1 tablet (20 mg total) by mouth daily at 6 PM.     cephALEXin 250 MG capsule  Commonly known as:  KEFLEX  Take 1 capsule (250 mg total) by mouth every 8 (eight) hours. For 3 days then stop.     cholecalciferol 1000 UNITS tablet  Commonly known as:  VITAMIN D  Take 1,000 Units by mouth daily.     citalopram 20 MG tablet  Commonly known as:  CELEXA  Take 1 tablet (20 mg total) by mouth daily.     cyanocobalamin 500 MCG tablet  Take 1 tablet (500 mcg total) by mouth daily.     Fish Oil 1000 MG Caps  Take 1 capsule by mouth daily.     furosemide 40 MG tablet  Commonly known as:  LASIX  Take 1 tablet (40 mg total) by mouth daily. For 10 days then stop.     metoprolol tartrate 25 MG tablet  Commonly known as:  LOPRESSOR  Take 1 tablet (25 mg total) by mouth 2 (two) times daily.     potassium chloride SA 20 MEQ tablet  Commonly known as:  K-DUR,KLOR-CON  Take 1 tablet (20 mEq total) by mouth once. For 10 days then stop.     rivaroxaban 20 MG Tabs tablet  Commonly known as:  XARELTO  Take 1 tablet (20 mg total) by mouth daily with supper.     traMADol 50 MG tablet  Commonly known as:  ULTRAM  Take 1 tablet (50 mg total) by mouth every 6 (six) hours as needed for moderate pain.     vitamin E 400 UNIT capsule  Take 400 Units by mouth daily.       The patient has been discharged on:   1.Beta Blocker:  Yes [ x  ]                              No   [   ]                              If No, reason:  2.Ace Inhibitor/ARB: Yes [   ]                                     No  [    x]  If No, reason:Allergy to ARBs (rash)  3.Statin:   Yes [ x  ]                  No  [   ]                  If No, reason:  4.Ecasa:  Yes  [  x ]                  No   [   ]                  If No, reason:   Discharge  Instructions    AMB Referral to [facility]    Complete by:  As directed   Reason for consult:  Post hosptial transition and follow up  Expected date of contact:  1-3 days (reserved for hospital discharges)  Please assign to community nurse for transition of care calls and assess for home visits. Nurse please assess for transportation needs for medical appointments. Questions please call:  Natividad Brood, RN BSN [facility] Liaison  [phone] business mobile phone     Amb Referral to Cardiac Rehabilitation    Complete by:  As directed   Diagnosis:  CABG           Follow-up Information    Follow up with Grace Isaac, MD On 02/13/2014.   Specialty:  Cardiothoracic Surgery   Why:  Appointment is at [place] information:   [address] [place] [address] [phone]       Follow up with [facility] On 02/13/2014.   Why:  Please get CXR at 4:15   Contact information:   [facility]       Follow up with [name], PA-C On 01/22/2015.   Specialties:  Cardiology, Radiology   Why:  Appointment is at 8:30. Please have TSH followed up (5.6 in hospital)   Contact information:   [address] [place] [address] [phone]       Follow up with [name], MD.   Specialty:  Family Medicine   Why:  Please call for a follow up appointment regarding further surveillance HGA1C 6.2 (pre diabetes)   Contact information:   [place] [address] [phone]       Signed:  Nani Skillern PA-C 01/14/2015, 1:16 PM

## 2015-01-10 NOTE — Progress Notes (Signed)
PT Cancellation Note  Patient Details Name: [name] MRN: [id number] DOB: 11/08/1940   Cancelled Treatment:    Reason Eval/Treat Not Completed: Patient not medically ready Pt converted back to A-fib today and not feeling well. Will follow up as time allows.   Marguarite Arbour A Jasmaine Rochel 01/10/2015, 10:08 AM  [name], PT, DPT [phone]

## 2015-01-10 NOTE — Progress Notes (Signed)
SUBJECTIVE: feels poorly today with more SOB and weakness  OBJECTIVE:   Vitals:   Filed Vitals:   01/09/15 1700 01/09/15 2020 01/10/15 0457 01/10/15 0600  BP: 105/66 121/62 134/89 129/76  Pulse:  76 109 104  Temp:  97.8 F (36.6 C) 98.8 F (37.1 C)   TempSrc:  Oral Oral   Resp:  18 16   Height:      Weight:   68.13 kg (150 lb 3.2 oz)   SpO2:  93% 93%    I&O's:   Intake/Output Summary (Last 24 hours) at 01/10/15 [id number] Last data filed at 01/10/15 [id number]  Gross per 24 hour  Intake    120 ml  Output   1400 ml  Net  -1280 ml   TELEMETRY: Reviewed telemetry pt in Atrial fibrillation at 105bpm     PHYSICAL EXAM General: Well developed, well nourished, in no acute distress Head: Eyes PERRLA, No xanthomas.   Normal cephalic and atramatic  Lungs:   Clear bilaterally to auscultation anteriorly Heart:   Irregularly irregular S1 S2 Pulses are 2+ & equal. Abdomen: Bowel sounds are positive, abdomen soft and non-tender without masses  Extremities:   No clubbing, cyanosis or edema.  DP +1 Neuro: Alert and oriented X 3. Psych:  Good affect, responds appropriately   LABS: Basic Metabolic Panel:  Recent Labs  01/08/15 0505 01/09/15 0225  NA 133* 137  K 5.0 4.5  CL 100* 102  CO2 27 30  GLUCOSE 119* 132*  BUN 15 11  CREATININE 0.84 0.67  CALCIUM 9.8 10.0   Liver Function Tests: No results for input(s): AST, ALT, ALKPHOS, BILITOT, PROT, ALBUMIN in the last 72 hours. No results for input(s): LIPASE, AMYLASE in the last 72 hours. CBC:  Recent Labs  01/08/15 0505 01/09/15 0225  WBC 7.2 5.5  HGB 10.3* 9.8*  HCT 31.9* 30.3*  MCV 95.8 94.7  PLT 230 260   Cardiac Enzymes: No results for input(s): CKTOTAL, CKMB, CKMBINDEX, TROPONINI in the last 72 hours. BNP: Invalid input(s): POCBNP D-Dimer: No results for input(s): DDIMER in the last 72 hours. Hemoglobin A1C: No results for input(s): HGBA1C in the last 72 hours. Fasting Lipid Panel: No results for input(s): CHOL,  HDL, LDLCALC, TRIG, CHOLHDL, LDLDIRECT in the last 72 hours. Thyroid Function Tests:  Recent Labs  01/09/15 1000  TSH 5.641*   Anemia Panel: No results for input(s): VITAMINB12, FOLATE, FERRITIN, TIBC, IRON, RETICCTPCT in the last 72 hours. Coag Panel:   Lab Results  Component Value Date   INR 1.37 01/04/2015   INR 1.05 01/03/2015   INR 1.74* 12/28/2014    RADIOLOGY: Dg Chest 2 View  01/09/2015  CLINICAL DATA:  Status post CABG. EXAM: CHEST  2 VIEW COMPARISON:  Chest radiograph from one day prior. FINDINGS: Stable intact and aligned median sternotomy wires. Stable configuration of 2 lead left subclavian pacemaker. Stable cardiomediastinal silhouette with mild cardiomegaly. No pneumothorax. Stable small right and small to moderate left pleural effusions. No overt pulmonary edema. Left greater than right bibasilar lung opacities, unchanged, favor atelectasis. IMPRESSION: 1. Stable mild cardiomegaly without pulmonary edema. 2. Stable small to moderate left and small right pleural effusions with associated bibasilar atelectasis. Electronically Signed   By: Ilona Sorrel M.D.   On: 01/09/2015 09:17   Dg Chest 2 View  01/02/2015  CLINICAL DATA:  Preop EXAM: CHEST  2 VIEW COMPARISON:  06/03/2012 FINDINGS: Cardiomediastinal silhouette is stable. Dual lead cardiac pacemaker is unchanged in position. No acute infiltrate  or pleural effusion. No pulmonary edema. Bony thorax is unremarkable. IMPRESSION: No active cardiopulmonary disease. Electronically Signed   By: Lahoma Crocker M.D.   On: 01/02/2015 08:43   Dg Chest Port 1 View  01/08/2015  CLINICAL DATA:  Recent CABG. EXAM: PORTABLE CHEST 1 VIEW COMPARISON:  01/07/2015 FINDINGS: Sequelae of CABG and left atrial appendage clipping are again identified. Cardiac silhouette remains enlarged. Right jugular sheath is unchanged. Left subclavian approach dual lead pacemaker remains in place. Small bilateral pleural effusions have not significantly changed.  There are bibasilar parenchymal lung opacities with mild interstitial prominence, also not significantly changed. No pneumothorax is identified. IMPRESSION: 1. Unchanged support devices. 2. Cardiomegaly with unchanged pleural effusions, atelectasis, and possibly mild interstitial edema. Electronically Signed   By: Logan Bores M.D.   On: 01/08/2015 08:11   Dg Chest Port 1 View  01/07/2015  CLINICAL DATA:  CABG. EXAM: PORTABLE CHEST 1 VIEW COMPARISON:  01/06/2015. FINDINGS: Right IJ sheath in stable position. Prior CABG. Cardiac pacer and lead tips in right atrium and right ventricle. Left atrial appendage clip. Cardiomegaly with bilateral pulmonary interstitial prominence and bilateral effusions. Findings consistent with congestive heart failure. Findings have increased slightly from prior exam . Low lung volumes with basilar atelectasis. No pneumothorax. Left breast implant. IMPRESSION: 1. Right IJ sheath in stable position. 2. Prior CABG. Cardiac pacer and lead tips in right atrium and right ventricle. Left ventricular appendage clip noted. 3. Cardiomegaly with bilateral pulmonary interstitial prominence and bilateral pleural effusions. Findings consistent with congestive heart failure. Findings have increased slightly from prior exam. 4. Low lung volumes with basilar atelectasis . Electronically Signed   By: Marcello Moores  Register   On: 01/07/2015 07:14   Dg Chest Port 1 View  01/06/2015  CLINICAL DATA:  Post CABG EXAM: PORTABLE CHEST 1 VIEW COMPARISON:  01/05/2015 FINDINGS: Left-sided pacemaker and midline sternotomy wires overlies normal cardiac silhouette. Interval retraction of Swan-Ganz catheter. Removal of LEFT chest tube without pneumothorax. Atrial clip noted. Mediastinal drain removed. Persistent bilateral small effusions and basilar atelectasis are slightly worsened. IMPRESSION: Mild increase in bilateral basilar effusions and atelectasis. Removal of LEFT chest tube without pneumothorax.  Electronically Signed   By: Suzy Bouchard M.D.   On: 01/06/2015 07:34   Dg Chest Port 1 View  01/05/2015  CLINICAL DATA:  CABG 01/04/2015 EXAM: PORTABLE CHEST 1 VIEW COMPARISON:  01/04/2015 FINDINGS: Interval extubation with no change in LEFT basilar atelectasis. No new atelectasis. Swan-Ganz catheter remains unchanged. LEFT chest tube and mediastinal drain are unchanged. Removal of NG tube. Atrial clip noted. No pneumothorax. IMPRESSION: 1. Interval extubation without complication. 2. Stable remaining support apparatus. 3. LEFT chest tube in place without pneumothorax. 4. LEFT basilar atelectasis small effusion. Electronically Signed   By: Suzy Bouchard M.D.   On: 01/05/2015 10:09   Dg Chest Port 1 View  01/04/2015  CLINICAL DATA:  CABG EXAM: PORTABLE CHEST 1 VIEW COMPARISON:  01/02/2015 FINDINGS: Postop CABG. Endotracheal tube is high at T2 and could be advanced approximately 3 cm. NG tube in the stomach. Swan-Ganz catheter tip in the right main pulmonary artery. Left chest tube in place. No pneumothorax. Left lower lobe atelectasis and small left effusion. Negative for heart failure. IMPRESSION: Endotracheal tube is high and could be advanced 3 cm Left lower lobe atelectasis and effusion.  No pneumothorax Electronically Signed   By: Franchot Gallo M.D.   On: 01/04/2015 13:17    ASSESSMENT AND PLAN:  1. Unstable angina pectoris - cath showed  severe multivessel CAD  2. ASCAD multivessel s/p 3 vessel CABG POD #6. Continue ASA/statin/BB.  Weak today with SOB.  Will check a chest xray.  Suspect SOB and weakness due to being back in afib - see below for recs.  3. Paroxysmal atrial fibrillation - still having breakthrough of PAF on Amio load.  Currently in afib which I suspect is contributing to her feeling of weakness and SOB.  Will give a bolus of Amio 150mg  IV to try to convert back to NSR.  CHADSVasc score is at least 5 (age, gender, hypertension, vascular disease). Timing of starting  anticoagulation per CVTS but since she is back in afib would recommend starting today. LFTs ok. TSH mildly elevated.  Will check T4 and T3. PFTs with moderately reduced DLCO. Hopefully will be able to get off Amio 3 months out from CABG. Continue Amio load with 400mg  BID.At discharge decrease to 400mg  daily for 2 weeks then 200mg  daily. Follow daily QTc (stable today at 471msec). Her PPM has been  turned up to 75bpm  to suppress PAF.  4. Severe sinus bradycardia with normally functioning dual-chamber permanent pacemaker (medtronic).  5. Peripheral arterial disease status post stents to both superficial femoral arteries in 2012, currently asymptomatic  6. Hyperlipidemia. Started on statin this hospitalization.     Sueanne Margarita, MD  01/10/2015  8:06 AM

## 2015-01-10 NOTE — Progress Notes (Signed)
Pt asymptomatic, vitals WDL, pt converted back to atrial fibrillation, MD notified, on-call MD notified and ordered 2.5 mg Metoprolol and patient's morning dose of 12.5mg  Metoprolol and 400 mg Amiodarone to be given now, RN will continue to monitor, pt resting, call bell within reach and bed in lowest position.   Izola Price, RN 01/10/2015

## 2015-01-10 NOTE — Progress Notes (Signed)
CARDIAC REHAB PHASE I   Went to attempt ambulation with pt, pt getting up with physical therapy. Second attempt today. Will follow-up tomorrow.   Lenna Sciara, RN, BSN 01/10/2015 2:05 PM

## 2015-01-11 DIAGNOSIS — I482 Chronic atrial fibrillation, unspecified: Secondary | ICD-10-CM

## 2015-01-11 DIAGNOSIS — I4891 Unspecified atrial fibrillation: Secondary | ICD-10-CM

## 2015-01-11 LAB — GLUCOSE, CAPILLARY: Glucose-Capillary: 107 mg/dL — ABNORMAL HIGH (ref 65–99)

## 2015-01-11 LAB — T3 UPTAKE: T3 Uptake Ratio: 28 % (ref 24–39)

## 2015-01-11 MED ORDER — METOPROLOL TARTRATE 12.5 MG HALF TABLET
12.5000 mg | ORAL_TABLET | Freq: Two times a day (BID) | ORAL | Status: DC
  Administered 2015-01-11: 12.5 mg via ORAL

## 2015-01-11 MED ORDER — AMIODARONE IV BOLUS ONLY 150 MG/100ML
150.0000 mg | Freq: Once | INTRAVENOUS | Status: AC
  Administered 2015-01-11: 150 mg via INTRAVENOUS
  Filled 2015-01-11: qty 100

## 2015-01-11 MED ORDER — METOPROLOL TARTRATE 12.5 MG HALF TABLET
12.5000 mg | ORAL_TABLET | Freq: Once | ORAL | Status: AC
  Administered 2015-01-11: 12.5 mg via ORAL
  Filled 2015-01-11: qty 1

## 2015-01-11 MED ORDER — AMIODARONE LOAD VIA INFUSION
150.0000 mg | Freq: Once | INTRAVENOUS | Status: DC
  Filled 2015-01-11: qty 83.34

## 2015-01-11 MED ORDER — METOPROLOL TARTRATE 25 MG PO TABS
25.0000 mg | ORAL_TABLET | Freq: Two times a day (BID) | ORAL | Status: DC
  Administered 2015-01-11 – 2015-01-14 (×6): 25 mg via ORAL
  Filled 2015-01-11 (×7): qty 1

## 2015-01-11 MED ORDER — DILTIAZEM HCL 100 MG IV SOLR: 5.0000 mg/h | INTRAVENOUS | Status: DC

## 2015-01-11 NOTE — Progress Notes (Signed)
PT Cancellation Note  Patient Details Name: [name] MRN: [id number] DOB: 04/11/1940   Cancelled Treatment:    Reason Eval/Treat Not Completed: Patient declined, no reason specified Pt just got back into bed and declined therapy at this time. Will follow up next available time. Encouraged ambulation later tonight.   Marguarite Arbour A Dorrell [place] 01/11/2015, 3:07 PM  [name], [place], DPT [phone]

## 2015-01-11 NOTE — Progress Notes (Signed)
CARDIAC REHAB PHASE I   PRE:  Rate/Rhythm: 97 afib    BP: sitting 108/66    SaO2: 94 1L  MODE:  Ambulation: 500 ft   POST:  Rate/Rhythm: 130 afib    BP: sitting 114/73     SaO2: 94 1L, 87 RA, back to 1L  Pt tolerated fair. Able to increase distance. C/o general weakness, struggled to control RW. SOB stable with walking on 1L. Happy to walk. To recliner, practiced IS. Still requiring 1L. Will f/u. 1323-1400   Josephina Shih [place] CES, ACSM 01/11/2015 1:58 PM

## 2015-01-11 NOTE — Progress Notes (Addendum)
SUBJECTIVE:  Still feels week  OBJECTIVE:   Vitals:   Filed Vitals:   01/10/15 1504 01/10/15 1956 01/10/15 2257 01/11/15 0618  BP: 132/74 117/74 136/78 138/87  Pulse: 109 109 118 115  Temp: 98.8 F (37.1 C) 98.9 F (37.2 C)  98.4 F (36.9 C)  TempSrc: Oral Oral  Oral  Resp: 19 18  18   Height:      Weight:    66.9 kg (147 lb 7.8 oz)  SpO2: 94% 90%  94%   I&O's:   Intake/Output Summary (Last 24 hours) at 01/11/15 0910 Last data filed at 01/11/15 [id number]  Gross per 24 hour  Intake      0 ml  Output   1050 ml  Net  -1050 ml   TELEMETRY: Reviewed telemetry pt in atrial fibrillation with RVR at 125bpm:     PHYSICAL EXAM General: Well developed, well nourished, in no acute distress Head: Eyes PERRLA, No xanthomas.   Normal cephalic and atramatic  Lungs:   Clear bilaterally to auscultation and percussion. Heart:   irregularly irregular and tachy S1 S2 Pulses are 2+ & equal. Abdomen: Bowel sounds are positive, abdomen soft and non-tender without masses Extremities:   No clubbing, cyanosis or edema.  DP +1 Neuro: Alert and oriented X 3. Psych:  Good affect, responds appropriately   LABS: Basic Metabolic Panel:  Recent Labs  01/09/15 0225  NA 137  K 4.5  CL 102  CO2 30  GLUCOSE 132*  BUN 11  CREATININE 0.67  CALCIUM 10.0   Liver Function Tests: No results for input(s): AST, ALT, ALKPHOS, BILITOT, PROT, ALBUMIN in the last 72 hours. No results for input(s): LIPASE, AMYLASE in the last 72 hours. CBC:  Recent Labs  01/09/15 0225  WBC 5.5  HGB 9.8*  HCT 30.3*  MCV 94.7  PLT 260   Cardiac Enzymes: No results for input(s): CKTOTAL, CKMB, CKMBINDEX, TROPONINI in the last 72 hours. BNP: Invalid input(s): POCBNP D-Dimer: No results for input(s): DDIMER in the last 72 hours. Hemoglobin A1C: No results for input(s): HGBA1C in the last 72 hours. Fasting Lipid Panel: No results for input(s): CHOL, HDL, LDLCALC, TRIG, CHOLHDL, LDLDIRECT in the last 72  hours. Thyroid Function Tests:  Recent Labs  01/09/15 1000  TSH 5.641*   Anemia Panel: No results for input(s): VITAMINB12, FOLATE, FERRITIN, TIBC, IRON, RETICCTPCT in the last 72 hours. Coag Panel:   Lab Results  Component Value Date   INR 1.37 01/04/2015   INR 1.05 01/03/2015   INR 1.74* 12/28/2014    RADIOLOGY: Dg Chest 2 View  01/09/2015  CLINICAL DATA:  Status post CABG. EXAM: CHEST  2 VIEW COMPARISON:  Chest radiograph from one day prior. FINDINGS: Stable intact and aligned median sternotomy wires. Stable configuration of 2 lead left subclavian pacemaker. Stable cardiomediastinal silhouette with mild cardiomegaly. No pneumothorax. Stable small right and small to moderate left pleural effusions. No overt pulmonary edema. Left greater than right bibasilar lung opacities, unchanged, favor atelectasis. IMPRESSION: 1. Stable mild cardiomegaly without pulmonary edema. 2. Stable small to moderate left and small right pleural effusions with associated bibasilar atelectasis. Electronically Signed   By: Ilona Sorrel M.D.   On: 01/09/2015 09:17   Dg Chest 2 View  01/02/2015  CLINICAL DATA:  Preop EXAM: CHEST  2 VIEW COMPARISON:  06/03/2012 FINDINGS: Cardiomediastinal silhouette is stable. Dual lead cardiac pacemaker is unchanged in position. No acute infiltrate or pleural effusion. No pulmonary edema. Bony thorax is unremarkable. IMPRESSION: No  active cardiopulmonary disease. Electronically Signed   By: Lahoma Crocker M.D.   On: 01/02/2015 08:43   Dg Chest Port 1 View  01/08/2015  CLINICAL DATA:  Recent CABG. EXAM: PORTABLE CHEST 1 VIEW COMPARISON:  01/07/2015 FINDINGS: Sequelae of CABG and left atrial appendage clipping are again identified. Cardiac silhouette remains enlarged. Right jugular sheath is unchanged. Left subclavian approach dual lead pacemaker remains in place. Small bilateral pleural effusions have not significantly changed. There are bibasilar parenchymal lung opacities with mild  interstitial prominence, also not significantly changed. No pneumothorax is identified. IMPRESSION: 1. Unchanged support devices. 2. Cardiomegaly with unchanged pleural effusions, atelectasis, and possibly mild interstitial edema. Electronically Signed   By: Logan Bores M.D.   On: 01/08/2015 08:11   Dg Chest Port 1 View  01/07/2015  CLINICAL DATA:  CABG. EXAM: PORTABLE CHEST 1 VIEW COMPARISON:  01/06/2015. FINDINGS: Right IJ sheath in stable position. Prior CABG. Cardiac pacer and lead tips in right atrium and right ventricle. Left atrial appendage clip. Cardiomegaly with bilateral pulmonary interstitial prominence and bilateral effusions. Findings consistent with congestive heart failure. Findings have increased slightly from prior exam . Low lung volumes with basilar atelectasis. No pneumothorax. Left breast implant. IMPRESSION: 1. Right IJ sheath in stable position. 2. Prior CABG. Cardiac pacer and lead tips in right atrium and right ventricle. Left ventricular appendage clip noted. 3. Cardiomegaly with bilateral pulmonary interstitial prominence and bilateral pleural effusions. Findings consistent with congestive heart failure. Findings have increased slightly from prior exam. 4. Low lung volumes with basilar atelectasis . Electronically Signed   By: Marcello Moores  Register   On: 01/07/2015 07:14   Dg Chest Port 1 View  01/06/2015  CLINICAL DATA:  Post CABG EXAM: PORTABLE CHEST 1 VIEW COMPARISON:  01/05/2015 FINDINGS: Left-sided pacemaker and midline sternotomy wires overlies normal cardiac silhouette. Interval retraction of Swan-Ganz catheter. Removal of LEFT chest tube without pneumothorax. Atrial clip noted. Mediastinal drain removed. Persistent bilateral small effusions and basilar atelectasis are slightly worsened. IMPRESSION: Mild increase in bilateral basilar effusions and atelectasis. Removal of LEFT chest tube without pneumothorax. Electronically Signed   By: Suzy Bouchard M.D.   On: 01/06/2015  07:34   Dg Chest Port 1 View  01/05/2015  CLINICAL DATA:  CABG 01/04/2015 EXAM: PORTABLE CHEST 1 VIEW COMPARISON:  01/04/2015 FINDINGS: Interval extubation with no change in LEFT basilar atelectasis. No new atelectasis. Swan-Ganz catheter remains unchanged. LEFT chest tube and mediastinal drain are unchanged. Removal of NG tube. Atrial clip noted. No pneumothorax. IMPRESSION: 1. Interval extubation without complication. 2. Stable remaining support apparatus. 3. LEFT chest tube in place without pneumothorax. 4. LEFT basilar atelectasis small effusion. Electronically Signed   By: Suzy Bouchard M.D.   On: 01/05/2015 10:09   Dg Chest Port 1 View  01/04/2015  CLINICAL DATA:  CABG EXAM: PORTABLE CHEST 1 VIEW COMPARISON:  01/02/2015 FINDINGS: Postop CABG. Endotracheal tube is high at T2 and could be advanced approximately 3 cm. NG tube in the stomach. Swan-Ganz catheter tip in the right main pulmonary artery. Left chest tube in place. No pneumothorax. Left lower lobe atelectasis and small left effusion. Negative for heart failure. IMPRESSION: Endotracheal tube is high and could be advanced 3 cm Left lower lobe atelectasis and effusion.  No pneumothorax Electronically Signed   By: Franchot Gallo M.D.   On: 01/04/2015 13:17    ASSESSMENT AND PLAN:  1. Unstable angina pectoris - cath showed severe multivessel CAD  2. ASCAD multivessel s/p 3 vessel CABG POD #  6. Continue ASA/statin/BB. Weak today with SOB. Will check a chest xray. Suspect SOB and weakness due to being back in afib - see below for recs.  3. Paroxysmal atrial fibrillation - still having breakthrough of PAF on Amio load. Currently in afib with RVR in the 120's which I suspect is contributing to her feeling of weakness and SOB. CHADSVasc score is at least 5 (age, gender, hypertension, vascular disease). Restarted Xarelto.  LFTs ok. TSH mildly elevated but T4 and T3 uptake are normal. PFTs with moderately reduced DLCO. Continue Amio  load with 400mg  BID.Will increase lopressor to 25mg  BID to try to get better rate control.  Will give another dose of IV Amio 150mg  as well.   Follow daily QTc (stable today at 482msec). Her PPM has beenturned up to 75bpmto suppress PAF.  4. Severe sinus bradycardia with normally functioning dual-chamber permanent pacemaker (medtronic).  5. Peripheral arterial disease status post stents to both superficial femoral arteries in 2012, currently asymptomatic  6. Hyperlipidemia. Started on statin this hospitalization.    Sueanne Margarita, MD  01/11/2015  9:10 AM

## 2015-01-11 NOTE — Care Management Important Message (Signed)
Important Message  Patient Details  Name: [name] MRN: [id number] Date of Birth: 1940/12/14   Medicare Important Message Given:  Yes    Blaize Epple P Latriece Anstine 01/11/2015, 1:56 PM

## 2015-01-11 NOTE — Progress Notes (Addendum)
      [address]       [place] [address]             [phone]      7 Days Post-Op Procedure(s) (LRB): CORONARY ARTERY BYPASS GRAFTING (CABG) x 3 using left internal mammory artery and greater saphenous vein right leg harvested endoscopically. (N/A) TRANSESOPHAGEAL ECHOCARDIOGRAM (TEE) (N/A) CLIPPING OF ATRIAL APPENDAGE (N/A)  Subjective: She feels irregular fast heart rate. She is frustrated because she is just lying in bed.  Objective: Vital signs in last 24 hours: Temp:  [98.4 F (36.9 C)-98.9 F (37.2 C)] 98.4 F (36.9 C) (12/02 0618) Pulse Rate:  [104-118] 115 (12/02 0618) Cardiac Rhythm:  [-] Atrial fibrillation (12/02 0700) Resp:  [18-19] 18 (12/02 0618) BP: (106-138)/(64-87) 138/87 mmHg (12/02 0618) SpO2:  [90 %-94 %] 94 % (12/02 0618) Weight:  [147 lb 7.8 oz (66.9 kg)] 147 lb 7.8 oz (66.9 kg) (12/02 0618)  Pre op weight 64 kg Current Weight  01/11/15 147 lb 7.8 oz (66.9 kg)      Intake/Output from previous day: 12/01 0701 - 12/02 0700 In: 200 [P.O.:200] Out: 1050 [Urine:1050]   Physical Exam:  Cardiovascular: IRRR IRRR Pulmonary: Slightly diminished at bases Abdomen: Soft, non tender, bowel sounds present. Extremities: Mild bilateral lower extremity edema. Wounds: Clean and dry.  No erythema or signs of infection.  Lab Results: CBC:  Recent Labs  01/09/15 0225  WBC 5.5  HGB 9.8*  HCT 30.3*  PLT 260   BMET:   Recent Labs  01/09/15 0225  NA 137  K 4.5  CL 102  CO2 30  GLUCOSE 132*  BUN 11  CREATININE 0.67  CALCIUM 10.0    PT/INR:  Lab Results  Component Value Date   INR 1.37 01/04/2015   INR 1.05 01/03/2015   INR 1.74* 12/28/2014   ABG:  INR: Will add last result for INR, ABG once components are confirmed Will add last 4 CBG results once components are confirmed  Assessment/Plan:  1. CV - PAF. A fib with HR in the 110-120's this am.On Amiodarone 400 mg bid, Lopressor 12.5 mg bid,and Xarelto 20 mg  daily. Per Dr. Servando Snare, give Lopressor now. May need to increase BB. If still not rate controlled, may need to add Cardizem drip. Cardiology following. 2.  Pulmonary - On 1 liter of oxygen via [place]. Wean as tolerates.  Encourage incentive spirometer 3. Volume Overload - Continue Lasix 40 mg today 4.  Acute blood loss anemia - H and H this am 9.8 and 30.3 5. Continue with CRPI  [name],[name] MPA-C 01/11/2015,8:28 AM   Beta blocker increased  Continues on lasix Pacing wires out, internal pacer functioning  I have seen and examined [name] and agree with the above assessment  and plan.  Grace Isaac MD Beeper [phone] Office [phone] 01/11/2015 9:46 AM

## 2015-01-12 LAB — GLUCOSE, CAPILLARY: Glucose-Capillary: 119 mg/dL — ABNORMAL HIGH (ref 65–99)

## 2015-01-12 NOTE — Progress Notes (Addendum)
      [address]       [place] [address]             [phone]      8 Days Post-Op Procedure(s) (LRB): CORONARY ARTERY BYPASS GRAFTING (CABG) x 3 using left internal mammory artery and greater saphenous vein right leg harvested endoscopically. (N/A) TRANSESOPHAGEAL ECHOCARDIOGRAM (TEE) (N/A) CLIPPING OF ATRIAL APPENDAGE (N/A)  Subjective: She is feeling better this am.  Objective: Vital signs in last 24 hours: Temp:  [98.4 F (36.9 C)-98.9 F (37.2 C)] 98.4 F (36.9 C) (12/03 0424) Pulse Rate:  [78-110] 78 (12/03 0424) Cardiac Rhythm:  [-] Atrial fibrillation (12/02 2100) Resp:  [14-17] 14 (12/03 0424) BP: (111-128)/(78-83) 111/78 mmHg (12/03 0424) SpO2:  [89 %-97 %] 94 % (12/03 0430) Weight:  [145 lb 8.1 oz (66 kg)] 145 lb 8.1 oz (66 kg) (12/03 0430)  Pre op weight 64 kg Current Weight  01/12/15 145 lb 8.1 oz (66 kg)      Intake/Output from previous day: 12/02 0701 - 12/03 0700 In: 123 [P.O.:120; I.V.:3] Out: 200 [Urine:200]   Physical Exam:  Cardiovascular: IRRR Pulmonary: Slightly diminished at bases Abdomen: Soft, non tender, bowel sounds present. Extremities: Mild bilateral lower extremity edema. Wounds: Clean and dry.  No erythema or signs of infection.  Lab Results: CBC: No results for input(s): WBC, HGB, HCT, PLT in the last 72 hours. BMET:  No results for input(s): NA, K, CL, CO2, GLUCOSE, BUN, CREATININE, CALCIUM in the last 72 hours.  PT/INR:  Lab Results  Component Value Date   INR 1.37 01/04/2015   INR 1.05 01/03/2015   INR 1.74* 12/28/2014   ABG:  INR: Will add last result for INR, ABG once components are confirmed Will add last 4 CBG results once components are confirmed  Assessment/Plan:  1. CV - PAF. Paced this am.On Amiodarone 400 mg bid, Lopressor 25 mg bid,and Xarelto 20 mg daily.  Cardiology following. 2.  Pulmonary - On 1 liter of oxygen via [place]. Wean as tolerates.  Encourage incentive spirometer 3.  Volume Overload - Continue Lasix 40 mg today 4.  Acute blood loss anemia - H and H this am 9.8 and 30.3 5. Continue with CRPI  [name],[name] MPA-C 01/12/2015,8:54 AM   I have seen and examined the patient and agree with the assessment and plan as outlined.  Feels better today.  Possibly ready for d/c home 1-2 days.  Rexene Alberts, MD 01/12/2015 3:22 PM

## 2015-01-12 NOTE — Progress Notes (Signed)
CARDIAC REHAB PHASE I   PRE:  Rate/Rhythm: pacing 78  BP:  Supine:   Sitting:   Standing:    SaO2: 93%RA  MODE:  Ambulation: 550 ft   POST:  Rate/Rhythm: 84 paced  BP:  Supine:   Sitting: 135/77  Standing:    SaO2: 92-93%RA 1130-1202 Pt walked 550 ft on RA with rolling walker and asst x 1. Gait steady. Stopped a couple of times to rest. Encouraged pursed lip breathing. Pt stated thighs a little tired when walking. To bed after walk. Tolerated well . Has walker at home if needed. Left off oxygen.   Graylon Good, RN BSN  01/12/2015 11:59 AM

## 2015-01-13 MED ORDER — CEPHALEXIN 250 MG PO CAPS
250.0000 mg | ORAL_CAPSULE | Freq: Three times a day (TID) | ORAL | Status: DC
  Administered 2015-01-13 – 2015-01-14 (×4): 250 mg via ORAL
  Filled 2015-01-13 (×7): qty 1

## 2015-01-13 MED ORDER — LOSARTAN POTASSIUM 25 MG PO TABS
25.0000 mg | ORAL_TABLET | Freq: Every day | ORAL | Status: DC
  Administered 2015-01-13: 25 mg via ORAL
  Filled 2015-01-13: qty 1

## 2015-01-13 MED ORDER — DIPHENHYDRAMINE HCL 25 MG PO CAPS
25.0000 mg | ORAL_CAPSULE | Freq: Four times a day (QID) | ORAL | Status: DC | PRN
  Administered 2015-01-13: 25 mg via ORAL
  Filled 2015-01-13: qty 1

## 2015-01-13 NOTE — Progress Notes (Signed)
Nurse paged me as patient has rash on her back and has swelling, tenderness, and warmth at right antecubital area (previous IV). Regarding rash, just restarted on Cozaar. Patient took prior to surgery and did not have rash. Will give Benadryl PRN and monitor. She appears to have phlebitis with minor erythema. Will start on Keflex and may have compresses PRN.

## 2015-01-13 NOTE — Progress Notes (Addendum)
      [address]       [place] [address]             [phone]      9 Days Post-Op Procedure(s) (LRB): CORONARY ARTERY BYPASS GRAFTING (CABG) x 3 using left internal mammory artery and greater saphenous vein right leg harvested endoscopically. (N/A) TRANSESOPHAGEAL ECHOCARDIOGRAM (TEE) (N/A) CLIPPING OF ATRIAL APPENDAGE (N/A)  Subjective: Patient is slowly feeling better.  Objective: Vital signs in last 24 hours: Temp:  [98 F (36.7 C)-98.9 F (37.2 C)] 98 F (36.7 C) (12/04 0534) Pulse Rate:  [73-86] 75 (12/04 0534) Cardiac Rhythm:  [-] Atrial paced (12/03 1900) Resp:  [16] 16 (12/04 0534) BP: (124-153)/(65-98) 124/98 mmHg (12/04 0534) SpO2:  [90 %-99 %] 90 % (12/04 0534) Weight:  [145 lb 8 oz (65.998 kg)] 145 lb 8 oz (65.998 kg) (12/04 0534)  Pre op weight 64 kg Current Weight  01/13/15 145 lb 8 oz (65.998 kg)      Intake/Output from previous day: 12/03 0701 - 12/04 0700 In: 240 [P.O.:240] Out: -    Physical Exam:  Cardiovascular: Paced Pulmonary: Slightly diminished at bases Abdomen: Soft, non tender, bowel sounds present. Extremities: Trace bilateral lower extremity edema. Wounds: Clean and dry.  No erythema or signs of infection.  Lab Results: CBC: No results for input(s): WBC, HGB, HCT, PLT in the last 72 hours. BMET:  No results for input(s): NA, K, CL, CO2, GLUCOSE, BUN, CREATININE, CALCIUM in the last 72 hours.  PT/INR:  Lab Results  Component Value Date   INR 1.37 01/04/2015   INR 1.05 01/03/2015   INR 1.74* 12/28/2014   ABG:  INR: Will add last result for INR, ABG once components are confirmed Will add last 4 CBG results once components are confirmed  Assessment/Plan:  1. CV - PAF. Paced this am.On Amiodarone 400 mg bid, Lopressor 25 mg bid,and Xarelto 20 mg daily. Will restart low dose Cozaar for better BP control.   2.  Pulmonary - On room air. Encourage incentive spirometer 3. Volume Overload - Continue Lasix 40  mg today 4.  Acute blood loss anemia - H and H this am 9.8 and 30.3 5. Discharge in am  [name],[name] MPA-C 01/13/2015,9:19 AM    I have seen and examined the patient and agree with the assessment and plan as outlined.  Rash might be due to amiodarone.  Mild phlebitis from old IV in right arm looks okay - unclear whether or not she might have received IV amiodarone through that IV at some point.  Rexene Alberts, MD 01/13/2015 1:13 PM

## 2015-01-13 NOTE — Progress Notes (Signed)
Physical Therapy Treatment Patient Details Name: [name] MRN: [id number] DOB: 11-15-1940 Today's Date: 01/13/2015    History of Present Illness Patient is a 74 y/o female s/p CABG x3 and clipping of atrial appendage. PMH includes HTN, CAD, PAD, PVD, mitral regurgitation, pacemaker, PAF.    PT Comments    Stair training complete. Pt is at supervision level for all functional mobility. All further needs can be addressed by HHPT. Acute care PT signing off.  Follow Up Recommendations  Home health PT;Supervision - Intermittent     Equipment Recommendations  None recommended by PT    Recommendations for Other Services       Precautions / Restrictions Precautions Precautions: Sternal Restrictions Weight Bearing Restrictions: Yes RUE Weight Bearing: Non weight bearing LUE Weight Bearing: Non weight bearing    Mobility  Bed Mobility     Rolling: Modified independent (Device/Increase time)       Sit to sidelying: Modified independent (Device/Increase time)    Transfers   Equipment used: Ambulation equipment used   Sit to Stand: Supervision         General transfer comment: verbal cues for sternal precautions  Ambulation/Gait Ambulation/Gait assistance: Supervision Ambulation Distance (Feet): 350 Feet Assistive device: Rolling walker (2 wheeled) Gait Pattern/deviations: Step-through pattern;Decreased stride length Gait velocity: decreased   General Gait Details: steady gait, Pt ambulating in room supervision without A.D.   Stairs Stairs: Yes Stairs assistance: Supervision Stair Management: One rail Right;Forwards;Alternating pattern Number of Stairs: 6 General stair comments: alternating pattern with ascend and step to pattern with descend  Wheelchair Mobility    Modified Rankin (Stroke Patients Only)       Balance                                    Cognition Arousal/Alertness: Awake/alert Behavior During Therapy: WFL for tasks  assessed/performed Overall Cognitive Status: Within Functional Limits for tasks assessed                      Exercises      General Comments        Pertinent Vitals/Pain Pain Assessment: No/denies pain    Home Living                      Prior Function            PT Goals (current goals can now be found in the care plan section) Acute Rehab PT Goals Patient Stated Goal: to go home PT Goal Formulation: With patient/family Time For Goal Achievement: 01/22/15 Potential to Achieve Goals: Good Progress towards PT goals: Goals met/education completed, patient discharged from PT    Frequency       PT Plan Current plan remains appropriate    Co-evaluation             End of Session Equipment Utilized During Treatment: Gait belt Activity Tolerance: Patient tolerated treatment well Patient left: in bed;with call bell/phone within reach     Time: 0950-1005 PT Time Calculation (min) (ACUTE ONLY): 15 min  Charges:  $Gait Training: 8-22 mins                    G Codes:      Lorriane Shire 01/13/2015, 10:14 AM

## 2015-01-13 NOTE — Progress Notes (Signed)
Tacy Dura PA notified of the burning rashes on pts back. New order received for PRN Bandryl. PA was made aware of the tenderness on the pt's right hand and right arm. Will continue to monitor. Oren Beckmann, RN

## 2015-01-14 ENCOUNTER — Encounter: Payer: Self-pay | Admitting: Cardiovascular Disease

## 2015-01-14 ENCOUNTER — Inpatient Hospital Stay (HOSPITAL_COMMUNITY): Payer: Commercial Managed Care - HMO

## 2015-01-14 LAB — CBC
HCT: 31.9 % — ABNORMAL LOW (ref 36.0–46.0)
Hemoglobin: 10.2 g/dL — ABNORMAL LOW (ref 12.0–15.0)
MCH: 30.2 pg (ref 26.0–34.0)
MCHC: 32 g/dL (ref 30.0–36.0)
MCV: 94.4 fL (ref 78.0–100.0)
Platelets: 485 10*3/uL — ABNORMAL HIGH (ref 150–400)
RBC: 3.38 MIL/uL — ABNORMAL LOW (ref 3.87–5.11)
RDW: 12.8 % (ref 11.5–15.5)
WBC: 8 10*3/uL (ref 4.0–10.5)

## 2015-01-14 LAB — BASIC METABOLIC PANEL
Anion gap: 7 (ref 5–15)
BUN: 9 mg/dL (ref 6–20)
CO2: 34 mmol/L — ABNORMAL HIGH (ref 22–32)
Calcium: 10.2 mg/dL (ref 8.9–10.3)
Chloride: 93 mmol/L — ABNORMAL LOW (ref 101–111)
Creatinine, Ser: 0.78 mg/dL (ref 0.44–1.00)
GFR calc Af Amer: >60 mL/min (ref >60)
GFR calc non Af Amer: >60 mL/min (ref >60)
Glucose, Bld: 135 mg/dL — ABNORMAL HIGH (ref 65–99)
Potassium: 3.8 mmol/L (ref 3.5–5.1)
Sodium: 134 mmol/L — ABNORMAL LOW (ref 135–145)

## 2015-01-14 MED ORDER — FUROSEMIDE 40 MG PO TABS: 40.0000 mg | ORAL_TABLET | Freq: Every day | ORAL | Status: DC

## 2015-01-14 MED ORDER — POTASSIUM CHLORIDE CRYS ER 20 MEQ PO TBCR
20.0000 meq | EXTENDED_RELEASE_TABLET | Freq: Once | ORAL | Status: AC
  Administered 2015-01-14: 20 meq via ORAL
  Filled 2015-01-14: qty 1

## 2015-01-14 MED ORDER — POTASSIUM CHLORIDE CRYS ER 20 MEQ PO TBCR: 20.0000 meq | EXTENDED_RELEASE_TABLET | Freq: Once | ORAL | Status: DC

## 2015-01-14 MED ORDER — CEPHALEXIN 250 MG PO CAPS: 250.0000 mg | ORAL_CAPSULE | Freq: Three times a day (TID) | ORAL | Status: DC

## 2015-01-14 MED ORDER — ATORVASTATIN CALCIUM 20 MG PO TABS: 20.0000 mg | ORAL_TABLET | Freq: Every day | ORAL | Status: DC

## 2015-01-14 MED ORDER — AMIODARONE HCL 200 MG PO TABS: 200.0000 mg | ORAL_TABLET | Freq: Two times a day (BID) | ORAL | Status: DC

## 2015-01-14 MED ORDER — TRAMADOL HCL 50 MG PO TABS
50.0000 mg | ORAL_TABLET | Freq: Four times a day (QID) | ORAL | Status: DC | PRN
Start: 2015-01-14 — End: 2015-01-22

## 2015-01-14 MED ORDER — AMIODARONE HCL 200 MG PO TABS
200.0000 mg | ORAL_TABLET | Freq: Two times a day (BID) | ORAL | Status: DC
  Administered 2015-01-14: 200 mg via ORAL
  Filled 2015-01-14: qty 1

## 2015-01-14 MED ORDER — METOPROLOL TARTRATE 25 MG PO TABS: 25.0000 mg | ORAL_TABLET | Freq: Two times a day (BID) | ORAL | Status: DC

## 2015-01-14 MED ORDER — TRAMADOL HCL 50 MG PO TABS: 50.0000 mg | ORAL_TABLET | Freq: Four times a day (QID) | ORAL | Status: DC | PRN

## 2015-01-14 NOTE — Progress Notes (Signed)
[id number] Pt stated for discharge. Education completed with pt who voiced understanding. Encouraged IS. Discussed watching carbs with A1C at 6.2 and heart healthy tips. Discussed CRP 2 and will refer to [place]. Offered to put on discharge video but pt did not wish to view. Pt asked about financial asst because co-pay high so made handouts of financial assistance information forms and gave to pt. Graylon Good RN BSN 01/14/2015 10:24 AM

## 2015-01-14 NOTE — Progress Notes (Addendum)
[address]       [place] [address]             [phone]      10 Days Post-Op Procedure(s) (LRB): CORONARY ARTERY BYPASS GRAFTING (CABG) x 3 using left internal mammory artery and greater saphenous vein right leg harvested endoscopically. (N/A) TRANSESOPHAGEAL ECHOCARDIOGRAM (TEE) (N/A) CLIPPING OF ATRIAL APPENDAGE (N/A)  Subjective: Patient is slowly feeling better.  Objective: Vital signs in last 24 hours: Temp:  [97.9 F (36.6 C)-99.5 F (37.5 C)] 99.5 F (37.5 C) (12/05 0516) Pulse Rate:  [75-81] 76 (12/05 0516) Cardiac Rhythm:  [-] Atrial paced (12/04 1900) Resp:  [16-18] 16 (12/05 0516) BP: (115-145)/(57-68) 137/63 mmHg (12/05 0516) SpO2:  [90 %-97 %] 91 % (12/05 0516) Weight:  [145 lb (65.772 kg)] 145 lb (65.772 kg) (12/05 0516)  Pre op weight 64 kg Current Weight  01/14/15 145 lb (65.772 kg)      Intake/Output from previous day: 12/04 0701 - 12/05 0700 In: 480 [P.O.:480] Out: -    Physical Exam:  Cardiovascular: Paced Pulmonary: Slightly diminished at bases Abdomen: Soft, non tender, bowel sounds present. Extremities: Trace bilateral lower extremity edema. Wounds: Clean and dry.  No erythema or signs of infection.  Lab Results: CBC: No results for input(s): WBC, HGB, HCT, PLT in the last 72 hours. BMET:  No results for input(s): NA, K, CL, CO2, GLUCOSE, BUN, CREATININE, CALCIUM in the last 72 hours.  PT/INR:  Lab Results  Component Value Date   INR 1.37 01/04/2015   INR 1.05 01/03/2015   INR 1.74* 12/28/2014   ABG:  INR: Will add last result for INR, ABG once components are confirmed Will add last 4 CBG results once components are confirmed  Assessment/Plan:  1. CV - PAF. Paced this am.On Amiodarone 400 mg bid, Lopressor 25 mg bid,Cozaar 25 mg daily, and Xarelto 20 mg daily. Will decrease Amiodarone to 200 mg bid for one week then 200 mg daily. 2.  Pulmonary - On room air. Encourage incentive spirometer 3.  Volume Overload - Continue Lasix 40 mg today 4.  Acute blood loss anemia - H and H this am 9.8 and 30.3 5. Mild phlebitis right AC forearm. Started on Keflex yesterday. 6. Rash on back-has been on Amiodarone awhile so doubt it is the cause. Has had rash with Valsartan and I restarted her on Cozaar. She may have reaction to ARB. Will stop. 7. Low grade fever-likely secondary to atelectasis 8. Await PA/LAT CXR and lab results. Hope to discharge later today.  [name],[name] MPA-C 01/14/2015,7:57 AM    Dg Chest 2 View  01/14/2015  CLINICAL DATA:  Status post cardiac surgery 2-1/2 weeks ago EXAM: CHEST  2 VIEW COMPARISON:  01/09/2015 FINDINGS: Left chest wall pacer device is noted with lead in the right atrial appendage and right ventricle. The heart size is normal. Status post median sternotomy and CABG procedure. There is a moderate left pleural effusion which is unchanged in volume from previous exam. Similar appearance of small right pleural effusion. IMPRESSION: Persistent bilateral pleural effusions, left greater than right. Electronically Signed   By: Kerby Moors M.D.   On: 01/14/2015 09:23   Lab Results  Component Value Date   WBC 8.0 01/14/2015   HGB 10.2* 01/14/2015   HCT 31.9* 01/14/2015   PLT 485* 01/14/2015   GLUCOSE 135* 01/14/2015   ALT 13 12/28/2014   AST 16 12/28/2014   NA 134* 01/14/2015   K  3.8 01/14/2015   CL 93* 01/14/2015   CREATININE 0.78 01/14/2015   BUN 9 01/14/2015   CO2 34* 01/14/2015   TSH 5.641* 01/09/2015   INR 1.37 01/04/2015   HGBA1C 6.2* 01/04/2015    Plan d/c today I have seen and examined [name] and agree with the above assessment  and plan.  Grace Isaac MD Beeper [phone] Office [phone] 01/14/2015 2:30 PM

## 2015-01-14 NOTE — Care Management Note (Addendum)
Case Management Note Previous CM note initiated by Elenor Quinones RN CM  Patient Details  Name: [name] MRN: [id number] Date of Birth: 1940/07/28  Subjective/Objective:      Pt admitted with Accelerating Angina              Action/Plan:  Pt is independent from home.  Pt is scheduled for CABG 01/04/15.  Daughter will provide 24 hour supervision post discharge for recommended time period.  CM will continue to monitor for disposition needs   Expected Discharge Date:     01/14/15             Expected Discharge Plan:  [place]  In-House Referral:     Discharge planning Services  CM Consult  Post Acute Care Choice:  Home Health Choice offered to:  Patient  DME Arranged:  N/A DME Agency:  NA  HH Arranged:  PT HH Agency:  [place]  Status of Service:  Completed, signed off  Medicare Important Message Given:  Yes Date Medicare IM Given:    Medicare IM give by:    Date Additional Medicare IM Given:    Additional Medicare Important Message give by:     If discussed at [place] of Stay Meetings, dates discussed:    Additional Comments:  01/14/15- 1600- received notice from [place] that they could not accept referral due to no longer being in contract with pt's insurance provider- call made to pt- and choice offered again for [facility] agency options- per pt she would like to try [facility] to see if they can accept referral for services- referral called to [place] with [facility] for HH-PT needs-   01/14/15- pt for d/c home today s/p CABG- PT recommending HHPT- spoke with pt at bedside who is agreeable to [facility] services- states that she has used [place] in the past and would like to use them again- reports that she already has a RW at home for use. Referral called to Tim with [facility] for HH-PT- order has been placed in [address], [place], [place] 01/14/2015, 2:29 PM

## 2015-01-14 NOTE — Progress Notes (Addendum)
Pt. Discharged to home alone escorted by daughter Pt. D/C'd via  wheelchair  Discharge information reviewed and given All personal belongings given to Pt.  Education discussed and reviewed IV was d/c intact upon removal, and sutures removed Tele d/c  Aracely Rickett 4:01 PM

## 2015-01-15 DIAGNOSIS — Z48812 Encounter for surgical aftercare following surgery on the circulatory system: Secondary | ICD-10-CM | POA: Diagnosis not present

## 2015-01-16 ENCOUNTER — Other Ambulatory Visit: Payer: Self-pay | Admitting: *Deleted

## 2015-01-16 DIAGNOSIS — I25700 Atherosclerosis of coronary artery bypass graft(s), unspecified, with unstable angina pectoris: Secondary | ICD-10-CM

## 2015-01-16 NOTE — Patient Outreach (Signed)
[facility]) Care Management  01/16/2015  [name] December 08, 1940 [id number]   Assessment: Transition of care call - first attempt 74 year old female with recent admission to the hospital from 11/22 - 12/5 with coronary artery disease s/p CABG x 3. Transition of care call completed. Patient reports "still weak but gradually improving and regaining strength". Denies any pain at present.  Patient lives alone but daughter is providing assistance with her needs at the moment. She reports understanding of medications and hospital stay. Patient has upcoming follow up appointments with providers starting on Tuesday 12/13 but reports having no transportation since she is use to driving herself to her appointments prior to surgery.  Referral to [facility] social worker made for transportation needs. Care management coordinator reinforced [employer] benefit and notified patient on how to access this benefit but patient states would prefer not to be bothered much and have someone do it for now since she is still weak and recovering. Patient made aware of referral to [place] worker for transport at the soonest time. Assisted patient by reviewing and clarifying the different upcoming doctors' appointments she has per discharge instructions. Encouraged her to call and schedule a follow-up appointment with primary care provider (Dr.L. Sabra Heck) as per discharge instructions.  Patient denies other immediate concerns or issues at present.  According to patient, [facility] staff reached out to her yesterday to do initial evaluation. Encouraged patient to call [facility], care management coordinator and 24-hour nurse line as necessary. Contact information provided.    Plan: Initial transition of care on 12/14. Referral to [facility] social worker for transportation needs. Will discuss need with [facility] social worker assigned.  [name] A. Asha Grumbine, BSN, RN-BC [place] Management Coordinator Cell: [phone]

## 2015-01-17 ENCOUNTER — Other Ambulatory Visit: Payer: Self-pay | Admitting: Licensed Clinical Social Worker

## 2015-01-17 NOTE — Patient Outreach (Signed)
[facility]) Care Management  01/17/2015  [name] [birth year] [id number]   Assessment-This CSW is covering for [place] at this time. CSW received referral from [place] to assist patient with transportation. CSW completed outreach to patient on 01/17/15. Patient answered and provided HIPPA verifications. CSW introduced self, reason for call and of [place] services. Patient agreeable to social work assistance and is aware that this CSW is covering for [place]. Patient reports that she usually drives herself to her medical appointments but is not able to do so at this time. Patient shares that her daughter has been transporting her to medical appointments but that it has become an inconvenience as her daughter has to take time off from work in order to do this. CSW educated patient on transportation resources within the [employer] such as [employer] and [employer]. Patient reports that she does not feel that she would need either referrals as "I will be able to drive within a month or so." Patient educated patient on [place] transportation and patient is agreeable to CSW setting up arrangements for transportation to her medical appointment on 01/22/15. CSW provided information on program and her benefits which includes 12 trips per year. Patient agreeable to CSW mailing to her a complete list of directions on how to set up transportation with [facility] in case she needed to use service in the future. Patient denies needing any medical equipment to bring with her to medical appointment. CSW set up transportation arrangements for her appointment located at [place], [place] [address] at 8:30. [employer] transportation will arrive at patient's residence between 7:40 and 8:00 am and transportation will pick up patient from medical appointment at 9:40 am with a 20 minutes waiting period if needed. Patient's  transportation reservation number is [id number]. Patient is appreciative of social work assistance and is aware that [place] will be in touch to evaluate any further social work needs.  Plan-CSW will send in basket message to Care Management Assistant to mail out [facility] transportation directions. CSW will update CSW [employer] CSW will remain available to patient for all further needs.  [name], BSW, MSW, [email or web address] Phone: [phone] Fax: [phone]

## 2015-01-18 NOTE — Patient Outreach (Signed)
[place] [facility]) Care Management  01/18/2015  [name] 1940-04-30 [id number]   Request received from Eula Fried, LCSW to mail patient information on transportation through [place]. Information mailed today, 01/18/15.  [place] Management Assistant

## 2015-01-22 ENCOUNTER — Ambulatory Visit (INDEPENDENT_AMBULATORY_CARE_PROVIDER_SITE_OTHER): Payer: Commercial Managed Care - HMO | Admitting: Physician Assistant

## 2015-01-22 ENCOUNTER — Encounter: Payer: Self-pay | Admitting: Physician Assistant

## 2015-01-22 VITALS — BP 128/74 | HR 86 | Ht 64.5 in | Wt 131.5 lb

## 2015-01-22 DIAGNOSIS — I4891 Unspecified atrial fibrillation: Secondary | ICD-10-CM | POA: Diagnosis not present

## 2015-01-22 DIAGNOSIS — Z7901 Long term (current) use of anticoagulants: Secondary | ICD-10-CM

## 2015-01-22 LAB — PACEMAKER DEVICE OBSERVATION

## 2015-01-22 MED ORDER — RIVAROXABAN 20 MG PO TABS: 20.0000 mg | ORAL_TABLET | Freq: Every day | ORAL | Status: DC

## 2015-01-22 NOTE — Progress Notes (Signed)
Cardiology Office Note   Date:  01/22/2015   ID:  [name], DOB Mar 02, 1940, MRN [id number]  PCP:  [name], MD  Cardiologist: Dr Juanetta Snow, PA-C   Chief Complaint  Patient presents with  . Follow-up    CABGx3/placement of atrial clip//pt c/o SOB from weakness, had some chest pain last night    History of Present Illness: [name] is a 74 y.o. female with a history of afib, MDT PPM, HTN, MR/MVP, HL, who was in-hosp 11/22>12/05 for angina>>CABG x 3 w/ LIMA-LAD, SVG-RI, SVG-PDA & LAA clipping.  [name] presents for post CABG follow-up.  Since discharge from the hospital she is done very well. She has stopped taking her Lasix as directed, but did not resume the aspirin 81 mg or the Xarelto 20 mg daily. She thought she wasn't supposed to. She has not had palpitations. She has had some dyspnea on exertion that is improving. She has had some weakness but feels that she is improving slowly. She is a bit frustrated because she is an exerciser and is ready to get back to her usual activity. She has had some pain at her incision. The most bothersome pain she has had has been at her right wrist. She still has trouble twisting and moving it. She says there was some sort of line in there for several days, probably an arterial line.  In general, she is a little frustrated with the slowness of her progress but aware that she is making progress.  Past Medical History  Diagnosis Date  . GLUCOSE INTOLERANCE 12/31/2009  . HYPERLIPIDEMIA 12/31/2009  . ANXIETY 12/31/2009  . DEPRESSION 12/31/2009  . MITRAL REGURGITATION 12/31/2009  . HYPERTENSION 12/31/2009  . CORONARY ARTERY DISEASE 12/31/2009  . MITRAL VALVE PROLAPSE 12/31/2009  . MENOPAUSE, EARLY 12/31/2009  . Unspecified urinary incontinence 12/31/2009  . ABDOMINAL PAIN, LOWER 12/31/2009  . DIVERTICULITIS, HX OF 12/31/2009  . Pacemaker 01/21/2012    MDT Adapta dual chamber  . Tachycardia-bradycardia  syndrome ([place])     [name] 01/21/2012  . PAD (peripheral artery disease) ([place])   . Heart murmur     "used to say I did" (01/21/2012)  . OSTEOARTHRITIS, HAND 12/31/2009  . [place] DISEASE, CERVICAL 12/31/2009  . [place] DISEASE, LUMBAR 12/31/2009  . Symptomatic sinus bradycardia 01/22/2012  . PAF (paroxysmal atrial fibrillation) ([place]) 01/22/2012  . Sinus node dysfunction ([place]) 01/22/2012  . S/P placement of cardiac pacemaker, medtronic adapta 01/21/12 01/22/2012  . PVD (peripheral vascular disease), hx stents to bil SFAs 02/2010 01/22/2012  . PAD (peripheral artery disease) [facility])     Past Surgical History  Procedure Laterality Date  . Cardiac catheterization  2005    "negative" (01/21/2012)  . Oophorectomy  ~1979  . Partial colectomy  2010  . Carpal tunnel release  2008    "right hand/thumb; carpal tunnel repair; got rid of arthritis" (01/21/2012)  . Insert / replace / remove pacemaker  01/21/2012    initial placement  . Vaginal hysterectomy  1975  . Posterior cervical laminectomy  1985  . Peripheral arterial stent graft  2012; 2012    "LLE; RLE" (01/21/2012)  . Facelift, lower 2/3  1995    "mini" (01/21/2012)  . Augmentation mammaplasty  1983  . Pacemaker insertion  01/21/2012    Medtronic Adapta, model# ADDRL1, serial# [id number]  . Lower arterial examination  10/28/2011    R. SFA stent mild-moderate mixed density plaque with elevated velocities consistent with 50% diameter  reduction. L. SFA stent moderate mixed denisty plaque at mid to distal level consistent with 50-69% diameter reduction.  . Cardiac catheterization  09/17/1999    Mild CAD involving proximal portion of LAD. Mitral valve prolapse w/ mitral regurg. No evidence of renal artery stenosis. Recommended readjustment of antihypertensive medications.  . Persantine myoview stress test  02/25/2010    No scintigraphic evidence of inducible myocardial ischemia. No persantine EKG changes. Non-diagnositc for ishcemia. Low-risk, normal  scan.  Marland Kitchen Permanent pacemaker insertion N/A 01/21/2012    Procedure: PERMANENT PACEMAKER INSERTION;  Surgeon: Sanda Klein, MD;  Location: [facility] CATH LAB;  Service: Cardiovascular;  Laterality: N/A;  . Cardiac catheterization N/A 01/01/2015    Procedure: Left Heart Cath and Coronary Angiography;  Surgeon: Jettie Booze, MD;  Location: [facility] CV [facility];  Service: Cardiovascular;  Laterality: N/A;  . Coronary artery bypass graft N/A 01/04/2015    Procedure: CORONARY ARTERY BYPASS GRAFTING (CABG) x 3 using left internal mammory artery and greater saphenous vein right leg harvested endoscopically.;  Surgeon: Grace Isaac, MD;  LIMA-LAD, SVG-RI, SVG-PDA  . Tee without cardioversion N/A 01/04/2015    Procedure: TRANSESOPHAGEAL ECHOCARDIOGRAM (TEE);  Surgeon: Grace Isaac, MD;  Location: [facility];  Service: Open Heart Surgery;  Laterality: N/A;  . Clipping of atrial appendage N/A 01/04/2015    Procedure: CLIPPING OF ATRIAL APPENDAGE;  Surgeon: Grace Isaac, MD;  Location: [facility];  Service: Open Heart Surgery;  Laterality: N/A;    Current Outpatient Prescriptions  Medication Sig Dispense Refill  . amiodarone (PACERONE) 200 MG tablet Take 1 tablet (200 mg total) by mouth 2 (two) times daily. For one week then take Amiodarone 200 mg by mouth daily thereafter 30 tablet 1  . aspirin 81 MG EC tablet Take 81 mg by mouth daily.     Marland Kitchen atorvastatin (LIPITOR) 20 MG tablet Take 1 tablet (20 mg total) by mouth daily at 6 PM. 30 tablet 1  . cephALEXin (KEFLEX) 250 MG capsule Take 1 capsule (250 mg total) by mouth every 8 (eight) hours. For 3 days then stop. 10 capsule 0  . cholecalciferol (VITAMIN D) 1000 UNITS tablet Take 1,000 Units by mouth daily.    . citalopram (CELEXA) 20 MG tablet Take 1 tablet (20 mg total) by mouth daily. 90 tablet 0  . cyanocobalamin 500 MCG tablet Take 1 tablet (500 mcg total) by mouth daily. 90 tablet 3  . furosemide (LASIX) 40 MG tablet Take 1 tablet (40 mg total) by  mouth daily. For 10 days then stop. 10 tablet 0  . metoprolol tartrate (LOPRESSOR) 25 MG tablet Take 1 tablet (25 mg total) by mouth 2 (two) times daily. 60 tablet 1  . Omega-3 Fatty Acids (FISH OIL) 1000 MG CAPS Take 1 capsule by mouth daily.    . potassium chloride SA (K-DUR,KLOR-CON) 20 MEQ tablet Take 1 tablet (20 mEq total) by mouth once. For 10 days then stop. 10 tablet 0  . rivaroxaban (XARELTO) 20 MG TABS tablet Take 1 tablet (20 mg total) by mouth daily with supper. 20 tablet 0  . traMADol (ULTRAM) 50 MG tablet Take 1 tablet (50 mg total) by mouth every 6 (six) hours as needed for moderate pain. 30 tablet 0  . vitamin E 400 UNIT capsule Take 400 Units by mouth daily.     No current facility-administered medications for this visit.    Allergies:   Clonidine derivatives; Plavix; and Exforge    Social History:  The patient  reports that she has quit smoking. Her smoking use included Cigarettes. She has a 7.5 pack-year smoking history. She has never used smokeless tobacco. She reports that she drinks about 9.0 oz of alcohol per week. She reports that she does not use illicit drugs.   Family History:  The patient's family history includes Heart disease in her brother and mother; Hypertension in her brother, brother, and mother; Stroke in her brother, father, and mother.    ROS:  Please see the history of present illness. All other systems are reviewed and negative.    PHYSICAL EXAM: VS:  BP 128/74 mmHg  Pulse 86  Ht 5' 4.5" (1.638 m)  Wt 131 lb 8 oz (59.648 kg)  BMI 22.23 kg/m2 , BMI Body mass index is 22.23 kg/(m^2). GEN: Well nourished, well developed, female in no acute distress HEENT: normal for age  Neck: no JVD, no carotid bruit, no masses Cardiac: RRR; no murmur, no rubs, or gallops Respiratory:  clear to auscultation bilaterally, normal work of breathing GI: soft, nontender, nondistended, + BS MS: no deformity or atrophy; no edema; distal pulses are 2+ in all 4  extremities  Skin: warm and dry, no rash; incisions from the pain harvesting, the surgery itself, Sites, IV sites and chest tube sites are all healing well, without signs of infection. Areas of ecchymosis are resolving Neuro:  Strength and sensation are intact Psych: euthymic mood, full affect   EKG:  EKG is ordered today. The ekg ordered today demonstrates atrial pacing, ventricular sensing  Pacemaker interrogation: Her last episode of atrial fibrillation was while she was hospitalized and lasted 52 hours. No significant atrial or ventricular ectopy is noted since then. Heart rates generally well-controlled. She is ventricular pacing 0.3% of the time in atrial pacing 82% of the time. Device function is good and battery life is 8.5 years   Recent Labs: 12/28/2014: ALT 13 01/05/2015: Magnesium 1.9 01/09/2015: TSH 5.641* 01/14/2015: BUN 9; Creatinine, Ser 0.78; Hemoglobin 10.2*; Platelets 485*; Potassium 3.8; Sodium 134*    Lipid Panel No results found for: CHOL, TRIG, HDL, CHOLHDL, VLDL, LDLCALC, LDLDIRECT   Wt Readings from Last 3 Encounters:  01/22/15 131 lb 8 oz (59.648 kg)  01/14/15 145 lb (65.772 kg)  12/28/14 143 lb 14.4 oz (65.273 kg)     Other studies Reviewed: Additional studies/ records that were reviewed today include: Hospital records and previous office notes.  ASSESSMENT AND PLAN:  1.  Recent CABG: She is recovering slowly but well. She is asked to restart the aspirin. She was tolerating both medications well in the hospital and is to continue them. I discussed the need to use pain control medications in order that she can increase her activity and speech or recovery. She is agreeable to trying the tramadol to see if that helps her. She is to continue increasing her activity as directed.  2. Atrial fibrillation: Her last episode of atrial fibrillation was while she was in the hospital. She is to continue amiodarone and metoprolol.  3. Anticoagulation: She is to  restart Xarelto. CHADS2VASC=4   Current medicines are reviewed at length with the patient today.  The patient does not have concerns regarding medicines.  The following changes have been made:  Restart Xarelto and ASA, okay to get the tramadol filled and use as needed.  Labs/ tests ordered today include:   Orders Placed This Encounter  Procedures  . EKG 12-Lead     Disposition:   FU with Dr. Sallyanne Kuster  Signed, Kendalynn Wideman,  [address]  01/22/2015 10:12 AM    [facility] [address], [place], [place]  [address] Phone: [phone]; Fax: [phone]

## 2015-01-22 NOTE — Patient Instructions (Addendum)
Your physician has recommended you make the following change in your medication: RESTART [place]  Your physician recommends that you schedule a follow-up appointment in: [place]

## 2015-01-23 ENCOUNTER — Other Ambulatory Visit: Payer: Self-pay | Admitting: *Deleted

## 2015-01-23 ENCOUNTER — Encounter: Payer: Self-pay | Admitting: *Deleted

## 2015-01-23 NOTE — Patient Outreach (Addendum)
[facility]) Care Management   01/23/2015  [name] 1940/03/02 [id number]  [name] is an 74 y.o. female  Subjective: Patient reports "doing well";  "still some weakness but improving slowly" as stated.  She verbalized " I just would like to get well and do more like fixing my hair, putting make up on, doing exercises like I used to and attend Cardiac Rehab".  Objective:  BP 138/72 mmHg  Pulse 79  Resp 18  SpO2 96%  Review of Systems  Constitutional:       Still regaining strength after surgery per patient  HENT: Negative.   Eyes: Negative.   Respiratory: Negative for cough and wheezing.        Even and unlabored at rest- incentive spirometer use Lung sounds clear to auscultation  Cardiovascular: Negative for chest pain and leg swelling.       Regular rate and rhythm Has pacemaker Status post CABG x 3  Gastrointestinal: Negative for nausea, vomiting and abdominal pain.       Bowel sounds- positive Abdomen soft and non-tender   Genitourinary: Negative.   Musculoskeletal: Negative.  Negative for falls.  Skin: Negative.         bruised spots to arms gradually fading Incision sites to mid chest,upper abdomen and right thigh clean and dry.  Neurological: Negative.   Endo/Heme/Allergies: Negative.   Psychiatric/Behavioral: Negative.     Physical Exam  Current Medications:   Current Outpatient Prescriptions  Medication Sig Dispense Refill  . amiodarone (PACERONE) 200 MG tablet Take 1 tablet (200 mg total) by mouth 2 (two) times daily. For one week then take Amiodarone 200 mg by mouth daily thereafter 30 tablet 1  . aspirin 81 MG EC tablet Take 81 mg by mouth daily.     Marland Kitchen atorvastatin (LIPITOR) 20 MG tablet Take 1 tablet (20 mg total) by mouth daily at 6 PM. 30 tablet 1  . cholecalciferol (VITAMIN D) 1000 UNITS tablet Take 1,000 Units by mouth daily.    . cyanocobalamin 500 MCG tablet Take 1 tablet (500 mcg total) by mouth daily. 90 tablet 3  .  metoprolol tartrate (LOPRESSOR) 25 MG tablet Take 1 tablet (25 mg total) by mouth 2 (two) times daily. 60 tablet 1  . potassium chloride SA (K-DUR,KLOR-CON) 20 MEQ tablet Take 1 tablet (20 mEq total) by mouth once. For 10 days then stop. 10 tablet 0  . rivaroxaban (XARELTO) 20 MG TABS tablet Take 1 tablet (20 mg total) by mouth daily with supper. 90 tablet 1  . traMADol (ULTRAM) 50 MG tablet Take by mouth every 6 (six) hours as needed.    . vitamin E 400 UNIT capsule Take 400 Units by mouth daily.    . citalopram (CELEXA) 20 MG tablet Take 1 tablet (20 mg total) by mouth daily. 90 tablet 0  . Omega-3 Fatty Acids (FISH OIL) 1000 MG CAPS Take 1 capsule by mouth daily. Reported on 01/23/2015     No current facility-administered medications for this visit.    Functional Status:   In your present state of health, do you have any difficulty performing the following activities: 01/23/2015 01/01/2015  Hearing? - -  Vision? - -  Difficulty concentrating or making decisions? - -  Walking or climbing stairs? - -  Dressing or bathing? - -  Doing errands, shopping? - Y  Preparing Food and eating ? Y -  Using the Toilet? N -  In the past six months, have you  accidently leaked urine? N -  Do you have problems with loss of bowel control? N -  Managing your Medications? Y -  Managing your Finances? N -  Housekeeping or managing your Housekeeping? Y -    Fall/Depression Screening:    PHQ 2/9 Scores 01/23/2015 08/06/2014  PHQ - 2 Score 0 3  PHQ- 9 Score - 10    Assessment:   74 year old seen for transition of care related to Coronary Artery disease status post CABG x 3.   Arrived to patient's home. Patient is sitting on a recliner. [name]) and patient are present during this visit.   She is recovering slowly but well. Patient reports going to [employer] for exercises prior to surgery and can't wait to go back. [facility] seeing patient.  Patient  reports going to her follow-up appointment with cardiology PAC [name]) on 12/13. Patient shared that she cancelled her [place] transportation for that appointment because of a 1 hour wait time to be picked up after her doctor's visit. Her daughter was off of work on that day so, she provided transportation for patient. Patient verbalized that [name] was very pleased with the result of her surgery. Her blood pressure was good; ECG result was good and pacemaker was checked and is functioning well. She was asked to restart the aspirin, discussed the need to use pain control medications in order that she can increase her activity and speech or recovery. She is agreeable to trying the tramadol to see if that helps her. She is to continue increasing her activity as directed. Patient reports completing her Lasix and antibiotics (Keflex). She reports being recently started on Xarelto. Daughter assists in managing patient's medications.   During this visit, patient was encouraged to call and set-up appointment with primary care provider. She is scheduled to be seen on 12/22 by Dr. Garlon Hatchet for follow-up and surveillance of A1C = 6.2. She reports having her Flu vaccine on 9/16 and Pneumonia vaccine on 4/16.   Patient was encouraged to monitor for signs and symptoms over incision sites and report if any. Her incisions to mid chest, upper abdomen and right thigh are clean,dry and intact. She mentioned about her right wrist knot from arterial line site during surgery. She reports soreness and ache inspite of applying cold compress. She shared that provider will do ultrasound to her wrist if pain/ discomfort persist. Patient has follow-up appointments with Dr. Servando Snare (cardiovascular surgeon) on 1/5 as well as eye doctor (Dr. Katy Fitch). Patient agreed to transition of care call next week. Patient was encouraged to call [facility], care management coordinator or 24-hour nurse line as necessary. Contact information  with patient.    Plan: Transition of care call 12/22.  [facility] CM Care Plan Problem One        Most Recent Value   Care Plan Problem One  at risk for readmission   Role Documenting the Problem One  Care Management [place] for Problem One  Active   THN Long Term Goal (31-90 days)  patient will not be readmitted in the next 60 days   [facility]N Long Term Goal Start Date  01/16/15   Interventions for Problem One Long Term Goal  medication review and encourage adherence taking them as instructed,  encouraged patient to call pharmacy to get the filled prescription for Tramadol to use as needed,  remind attendance to follow- up appointments with providers,  instructed to call  and set-up appointment wih primary care provider,  encourage to monitor signs and symptoms of infection to surgical site like increase drainage,redness,warmth and swelling and notify provider if any   [facility] CM Short Term Goal #1 (0-30 days)  patient will report attendance to her follow-up appointments with specialists and primary care provider within the next 30 days   [facility] CM Short Term Goal #1 Start Date  01/16/15   Interventions for Short Term Goal #1  remind upcoming follow-up appointments with patient,  assisted to call and set-up follow-up appointment with primary care provider,  referral to [facility] social worker for transportation needs    [facility] CM Short Term Goal #2 (0-30 days)  patient will verbalize adherence to medications as ordered in the next 30 days   [facility]N CM Short Term Goal #2 Start Date  01/16/15   Interventions for Short Term Goal #2  medications reviewed to confirm availability of medication supplies,  remind to follow instructions on Amiodarone, Potassium per discharge instruction,  assisted to call pharmacy for Tramadol prescription to use as needed to help with pain and that she can do more      [facility] CM Care Plan Problem Two        Most Recent Value   Care Plan Problem Two  limited activity and mobility related  to recent surgery    Role Documenting the Problem Two  Care Management [place] for Problem Two  Active   Interventions for Problem Two Long Term Goal   encourage paient's participation with home health physical therapy for strengthening,  encourage to follow-up and consult with provider as to when she can be able to attend Cardiac rehabilitation and do exercises   THN Long Term Goal (31-90) days  patient will be able to participate with Cardiac Rehab in the next 31 days   THN Long Term Goal Start Date  01/23/15   [facility]N CM Short Term Goal #1 (0-30 days)  patient will verbalize being able to groom self (fix hair) and apply make-up on in the next 30 days   [facility]N CM Short Term Goal #1 Start Date  01/23/15   Interventions for Short Term Goal #2   encourage active participation with physical therapy for strengthening,  remind patient to start with mild/minimal exercises as recommended and build endurance and to progress gradually as tolerated,  encourage to eat more to get proper nutrition and to speed up recovery     Edwena Felty A. Shahla Betsill, BSN, RN-BC [place] Management Coordinator Cell: [phone]

## 2015-01-24 ENCOUNTER — Encounter: Payer: Self-pay | Admitting: *Deleted

## 2015-01-28 ENCOUNTER — Telehealth: Payer: Self-pay | Admitting: Cardiovascular Disease

## 2015-01-28 NOTE — Telephone Encounter (Signed)
Returned call to patient Dr.Croitoru's recommendations given.Appointment scheduled with Dr.Croitoru 03/13/15 at 11:30 am.Scheduler will put you on cancellation list.Advised to call sooner if needed.

## 2015-01-28 NOTE — Telephone Encounter (Signed)
Already spoke to patient 01/28/15.Message was sent to Dr.Croitoru.Will send this message to Dr.Croitoru to let him know elevated B/P.

## 2015-01-28 NOTE — Telephone Encounter (Signed)
Please monitor heart rate and BP and calls [place] back with readings in 3 days. Call sooner if HR<55 or if SBP<100.

## 2015-01-28 NOTE — Telephone Encounter (Signed)
Returned call to patient.Samples of Xarelto 20 mg left at front desk.Stated she wanted Dr.Croitoru to know she had chest pain this past Saturday night.Stated she was sitting down and had pain in mid back radiated into chest.Stated she took a friend's NTG with no relief.Then she took a xanax.Pain lasted appox 1 hour.Stated she wanted to know if she needs NTG prescription.Also she had recent CABG and was told in hospital she does not need a post hospital follow up, just keep appointment with Dr.Croitoru 04/29/14.Message sent to Dr.Croitoru for advice.

## 2015-01-28 NOTE — Telephone Encounter (Signed)
See previous 01/28/15 note.

## 2015-01-28 NOTE — Telephone Encounter (Signed)
Prior to surgery she was on metoprolol succinate 100 mg daily. She is now receiving half that. Also, her amlodipine has been stopped. Please go back to metoprolol succinate 100 mg daily. After 3 days, restart amlodipine 5 mg daily.

## 2015-01-28 NOTE — Telephone Encounter (Signed)
Pt said Dr C Had said he was going to call in some nitroglycerin before she went to the hospital. She still have not gotten it. She had an episode of chest pains on Saturday night,she wanted Dr C to be aware of this.Please call her nitroglycerin in today if possible.

## 2015-01-28 NOTE — Telephone Encounter (Signed)
Eustaquio Maize is calling because Mrs. Glave says that over weekend she was having chest pains on Saturday Night and she took one of her sister-in - Law Nitro tablets , then ten minutes later she thought it was anxiety and thought she should take one of her Xanax . One thing that she did not mention that he BP was 180/95 and then after all settle down it went to 160/90. Please call   Thanks

## 2015-01-28 NOTE — Telephone Encounter (Signed)
Returned call to patient Dr.Croitoru's recommendations given.Patient will call back 02/01/15 to report B/P readings.

## 2015-01-28 NOTE — Telephone Encounter (Signed)
Please make an appointment first available, non urgent but sooner than March. I do not think she needs SL NTG. It is more likely that her pain was post-surgical

## 2015-01-28 NOTE — Telephone Encounter (Signed)
Patient calling the office for samples of medication:   1.  What medication and dosage are you requesting samples for? Xarelo  2.  Are you currently out of this medication? No,just 4 days left

## 2015-01-31 ENCOUNTER — Inpatient Hospital Stay (HOSPITAL_COMMUNITY)
Admission: AD | Admit: 2015-01-31 | Discharge: 2015-02-01 | DRG: 310 | Disposition: A | Discharge status: Home / Self Care | Payer: Commercial Managed Care - HMO | Source: Ambulatory Visit | Attending: Cardiovascular Disease | Admitting: Cardiovascular Disease

## 2015-01-31 ENCOUNTER — Other Ambulatory Visit: Payer: Self-pay | Admitting: *Deleted

## 2015-01-31 ENCOUNTER — Inpatient Hospital Stay (HOSPITAL_COMMUNITY): Payer: Commercial Managed Care - HMO

## 2015-01-31 ENCOUNTER — Encounter (HOSPITAL_COMMUNITY): Payer: Self-pay | Admitting: Cardiology

## 2015-01-31 DIAGNOSIS — Z888 Allergy status to other drugs, medicaments and biological substances status: Secondary | ICD-10-CM

## 2015-01-31 DIAGNOSIS — I351 Nonrheumatic aortic (valve) insufficiency: Secondary | ICD-10-CM | POA: Diagnosis not present

## 2015-01-31 DIAGNOSIS — I2510 Atherosclerotic heart disease of native coronary artery without angina pectoris: Secondary | ICD-10-CM | POA: Diagnosis present

## 2015-01-31 DIAGNOSIS — I739 Peripheral vascular disease, unspecified: Secondary | ICD-10-CM | POA: Diagnosis present

## 2015-01-31 DIAGNOSIS — F419 Anxiety disorder, unspecified: Secondary | ICD-10-CM | POA: Diagnosis present

## 2015-01-31 DIAGNOSIS — I480 Paroxysmal atrial fibrillation: Secondary | ICD-10-CM | POA: Diagnosis present

## 2015-01-31 DIAGNOSIS — I4891 Unspecified atrial fibrillation: Secondary | ICD-10-CM

## 2015-01-31 DIAGNOSIS — Z79899 Other long term (current) drug therapy: Secondary | ICD-10-CM

## 2015-01-31 DIAGNOSIS — I10 Essential (primary) hypertension: Secondary | ICD-10-CM | POA: Diagnosis present

## 2015-01-31 DIAGNOSIS — Z951 Presence of aortocoronary bypass graft: Secondary | ICD-10-CM

## 2015-01-31 DIAGNOSIS — I482 Chronic atrial fibrillation, unspecified: Secondary | ICD-10-CM | POA: Diagnosis present

## 2015-01-31 LAB — CBC WITH DIFFERENTIAL/PLATELET
Basophils Absolute: 0 10*3/uL (ref 0.0–0.1)
Basophils Relative: 1 %
Eosinophils Absolute: 0.4 10*3/uL (ref 0.0–0.7)
Eosinophils Relative: 7 %
HCT: 37.2 % (ref 36.0–46.0)
Hemoglobin: 12.3 g/dL (ref 12.0–15.0)
Lymphocytes Relative: 36 %
Lymphs Abs: 2.2 10*3/uL (ref 0.7–4.0)
MCH: 29.6 pg (ref 26.0–34.0)
MCHC: 33.1 g/dL (ref 30.0–36.0)
MCV: 89.4 fL (ref 78.0–100.0)
Monocytes Absolute: 0.5 10*3/uL (ref 0.1–1.0)
Monocytes Relative: 9 %
Neutro Abs: 3.1 10*3/uL (ref 1.7–7.7)
Neutrophils Relative %: 49 %
Platelets: 383 10*3/uL (ref 150–400)
RBC: 4.16 MIL/uL (ref 3.87–5.11)
RDW: 13 % (ref 11.5–15.5)
WBC: 6.3 10*3/uL (ref 4.0–10.5)

## 2015-01-31 LAB — MAGNESIUM: Magnesium: 2.1 mg/dL (ref 1.7–2.4)

## 2015-01-31 LAB — COMPREHENSIVE METABOLIC PANEL
ALT: 21 U/L (ref 14–54)
AST: 27 U/L (ref 15–41)
Albumin: 3.3 g/dL — ABNORMAL LOW (ref 3.5–5.0)
Alkaline Phosphatase: 162 U/L — ABNORMAL HIGH (ref 38–126)
Anion gap: 9 (ref 5–15)
BUN: 12 mg/dL (ref 6–20)
CO2: 26 mmol/L (ref 22–32)
Calcium: 10.9 mg/dL — ABNORMAL HIGH (ref 8.9–10.3)
Chloride: 102 mmol/L (ref 101–111)
Creatinine, Ser: 0.81 mg/dL (ref 0.44–1.00)
GFR calc Af Amer: >60 mL/min (ref >60)
GFR calc non Af Amer: >60 mL/min (ref >60)
Glucose, Bld: 137 mg/dL — ABNORMAL HIGH (ref 65–99)
Potassium: 4.4 mmol/L (ref 3.5–5.1)
Sodium: 137 mmol/L (ref 135–145)
Total Bilirubin: 0.5 mg/dL (ref 0.3–1.2)
Total Protein: 6.4 g/dL — ABNORMAL LOW (ref 6.5–8.1)

## 2015-01-31 LAB — TSH: TSH: 2.164 u[IU]/mL (ref 0.350–4.500)

## 2015-01-31 LAB — PROTIME-INR
INR: 1.65 — ABNORMAL HIGH (ref 0.00–1.49)
Prothrombin Time: 19.5 seconds — ABNORMAL HIGH (ref 11.6–15.2)

## 2015-01-31 LAB — TROPONIN I: Troponin I: <0.03 ng/mL (ref <0.031)

## 2015-01-31 LAB — T4, FREE: Free T4: 1.08 ng/dL (ref 0.61–1.12)

## 2015-01-31 MED ORDER — OMEGA-3-ACID ETHYL ESTERS 1 G PO CAPS
1.0000 g | ORAL_CAPSULE | Freq: Every day | ORAL | Status: DC
  Administered 2015-02-01: 1 g via ORAL
  Filled 2015-01-31: qty 1

## 2015-01-31 MED ORDER — ALPRAZOLAM 0.25 MG PO TABS
0.2500 mg | ORAL_TABLET | Freq: Two times a day (BID) | ORAL | Status: DC | PRN
  Administered 2015-01-31: 0.25 mg via ORAL
  Filled 2015-01-31: qty 1

## 2015-01-31 MED ORDER — ASPIRIN 81 MG PO CHEW
CHEWABLE_TABLET | ORAL | Status: AC
  Administered 2015-01-31: 81 mg
  Filled 2015-01-31: qty 1

## 2015-01-31 MED ORDER — ATORVASTATIN CALCIUM 20 MG PO TABS
20.0000 mg | ORAL_TABLET | Freq: Every day | ORAL | Status: DC
  Administered 2015-02-01: 20 mg via ORAL
  Filled 2015-01-31: qty 1

## 2015-01-31 MED ORDER — CITALOPRAM HYDROBROMIDE 20 MG PO TABS
20.0000 mg | ORAL_TABLET | Freq: Every day | ORAL | Status: DC
  Administered 2015-02-01: 20 mg via ORAL
  Filled 2015-01-31: qty 1

## 2015-01-31 MED ORDER — SODIUM CHLORIDE 0.9 % IV SOLN: INTRAVENOUS | Status: DC

## 2015-01-31 MED ORDER — ONDANSETRON HCL 4 MG/2ML IJ SOLN
4.0000 mg | Freq: Four times a day (QID) | INTRAMUSCULAR | Status: DC | PRN
Start: 2015-01-31 — End: 2015-02-01

## 2015-01-31 MED ORDER — METOPROLOL TARTRATE 50 MG PO TABS
50.0000 mg | ORAL_TABLET | Freq: Two times a day (BID) | ORAL | Status: DC
  Administered 2015-01-31 – 2015-02-01 (×2): 50 mg via ORAL
  Filled 2015-01-31 (×2): qty 1

## 2015-01-31 MED ORDER — VITAMIN E 180 MG (400 UNIT) PO CAPS: 400.0000 [IU] | ORAL_CAPSULE | Freq: Every day | ORAL | Status: DC

## 2015-01-31 MED ORDER — ACETAMINOPHEN 325 MG PO TABS: 650.0000 mg | ORAL_TABLET | ORAL | Status: DC | PRN

## 2015-01-31 MED ORDER — AMIODARONE HCL 200 MG PO TABS
200.0000 mg | ORAL_TABLET | Freq: Every day | ORAL | Status: DC
  Administered 2015-02-01: 200 mg via ORAL
  Filled 2015-01-31: qty 1

## 2015-01-31 MED ORDER — ENSURE ENLIVE PO LIQD: 237.0000 mL | Freq: Two times a day (BID) | ORAL | Status: DC

## 2015-01-31 MED ORDER — SODIUM CHLORIDE 0.9 % IV SOLN: 250.0000 mL | INTRAVENOUS | Status: DC

## 2015-01-31 MED ORDER — TRAMADOL HCL 50 MG PO TABS: 100.0000 mg | ORAL_TABLET | Freq: Four times a day (QID) | ORAL | Status: DC | PRN

## 2015-01-31 MED ORDER — ASPIRIN EC 81 MG PO TBEC
81.0000 mg | DELAYED_RELEASE_TABLET | Freq: Every day | ORAL | Status: DC
  Administered 2015-02-01: 81 mg via ORAL
  Filled 2015-01-31: qty 1

## 2015-01-31 MED ORDER — VITAMIN D 1000 UNITS PO TABS
1000.0000 [IU] | ORAL_TABLET | Freq: Every day | ORAL | Status: DC
  Administered 2015-02-01: 1000 [IU] via ORAL
  Filled 2015-01-31: qty 1

## 2015-01-31 MED ORDER — RIVAROXABAN 20 MG PO TABS
20.0000 mg | ORAL_TABLET | Freq: Every day | ORAL | Status: DC
  Administered 2015-01-31 – 2015-02-01 (×2): 20 mg via ORAL
  Filled 2015-01-31 (×2): qty 1

## 2015-01-31 MED ORDER — SODIUM CHLORIDE 0.9 % IJ SOLN: 3.0000 mL | INTRAMUSCULAR | Status: DC | PRN

## 2015-01-31 MED ORDER — AMLODIPINE BESYLATE 5 MG PO TABS
5.0000 mg | ORAL_TABLET | Freq: Every day | ORAL | Status: DC
  Administered 2015-02-01: 5 mg via ORAL
  Filled 2015-01-31: qty 1

## 2015-01-31 MED ORDER — SODIUM CHLORIDE 0.9 % IJ SOLN
3.0000 mL | Freq: Two times a day (BID) | INTRAMUSCULAR | Status: DC
  Administered 2015-02-01: 3 mL via INTRAVENOUS

## 2015-01-31 MED ORDER — RIVAROXABAN 20 MG PO TABS: 20.0000 mg | ORAL_TABLET | Freq: Every day | ORAL | Status: DC

## 2015-01-31 NOTE — Progress Notes (Signed)
NURSING PROGRESS NOTE  [name] [id number] Admission Data: 01/31/2015 8:23 PM Attending Provider: Sanda Klein, MD [id number] Jeani Hawking, MD Code Status: full   [name] is a 74 y.o. female patient admitted from ED:  -No acute distress noted.  -No complaints of shortness of breath.  -No complaints of chest pain.   Cardiac Monitoring: Box # 06 in place. Cardiac monitor yields: A-Fib  Blood pressure 128/68, pulse 117, temperature 97.7 F (36.5 C), temperature source Oral, resp. rate 16, height 5' 4.5" (1.638 m), weight 60.782 kg (134 lb), SpO2 99 %.   IV Fluids:    Allergies:  Clonidine derivatives; Plavix; and Exforge  Past Medical History:   has a past medical history of GLUCOSE INTOLERANCE (12/31/2009); HYPERLIPIDEMIA (12/31/2009); ANXIETY (12/31/2009); DEPRESSION (12/31/2009); MITRAL REGURGITATION (12/31/2009); HYPERTENSION (12/31/2009); CORONARY ARTERY DISEASE (12/31/2009); MITRAL VALVE PROLAPSE (12/31/2009); MENOPAUSE, EARLY (12/31/2009); Unspecified urinary incontinence (12/31/2009); ABDOMINAL PAIN, LOWER (12/31/2009); DIVERTICULITIS, HX OF (12/31/2009); Pacemaker (01/21/2012); Tachycardia-bradycardia syndrome ([place]); PAD (peripheral artery disease) ([place]); Heart murmur; OSTEOARTHRITIS, HAND (12/31/2009); DISC DISEASE, CERVICAL (12/31/2009); DISC DISEASE, LUMBAR (12/31/2009); Symptomatic sinus bradycardia (01/22/2012); PAF (paroxysmal atrial fibrillation) ([place]) (01/22/2012); Sinus node dysfunction (HCC) (01/22/2012); PVD (peripheral vascular disease), hx stents to bil SFAs 02/2010 (01/22/2012); PAD (peripheral artery disease) ([place]); and Atrial fibrillation with RVR ([place]).  Past Surgical History:   has past surgical history that includes Cardiac catheterization (2005); Oophorectomy (~1979); Partial colectomy (2010); Carpal tunnel release (2008); Insert / replace / remove pacemaker (01/21/2012); Vaginal hysterectomy (1975); Posterior cervical laminectomy (1985); Peripheral  arterial stent graft (2012; 2012); Facelift, lower 2/3 (1995); Augmentation mammaplasty (1983); Pacemaker insertion (01/21/2012); Lower Arterial Examination (10/28/2011); Cardiac catheterization (09/17/1999); Persantine Myoview Stress Test (02/25/2010); permanent pacemaker insertion (N/A, 01/21/2012); Cardiac catheterization (N/A, 01/01/2015); Coronary artery bypass graft (N/A, 01/04/2015); TEE without cardioversion (N/A, 01/04/2015); and Clipping of atrial appendage (N/A, 01/04/2015).  Social History:   reports that she has quit smoking. Her smoking use included Cigarettes. She has a 7.5 pack-year smoking history. She has never used smokeless tobacco. She reports that she drinks about 9.0 oz of alcohol per week. She reports that she does not use illicit drugs.  Skin:intact  Patient/Family orientated to room. Information packet given to patient/family. Admission inpatient armband information verified with patient/family to include name and date of birth and placed on patient arm. Side rails up x 2, fall assessment and education completed with patient/family. Patient/family able to verbalize understanding of risk associated with falls and verbalized understanding to call for assistance before getting out of bed. Call light within reach. Patient/family able to voice and demonstrate understanding of unit orientation instructions.    Will continue to evaluate and treat per MD orders.

## 2015-01-31 NOTE — Patient Outreach (Signed)
[facility]) Care Management  01/31/2015  [name] 02-16-1940 [id number]   Assessment: Transition of care call - week 3 Call placed and spoke to patient with report that she is "not quite as energetic as she wants to be". She mentions taking "breakfast essentials" and taking some rest to help regain her strength and energy. Patient states that she "had been feeling better" after a chest pain episode last Saturday (12/17). Patient denies any more chest pains felt after that. According to patient, she was not able to attend her grandson's graduation but she has a video of it- to watch.  Cardiologist (Dr. Sallyanne Kuster) was contacted regarding this chest pain and elevation of blood pressure episode and new orders made for Metoprolol (100 mg daily) and after 3 days, start Amlodipine (5 mg daily). Blood pressure monitoring for 3 days was also ordered and will call readings back to cardiologist on 12/23. According to patient, her blood pressures were ranging from 148/85- 160/90. Reiterated with patient to call cardiologist earlier if her heart rate < 55 and systolic blood pressure < 100 and she agreed.  Patient denies any signs and symptoms of infection to surgical sites as well as swelling.  She verbalized that she felt better when she got her hair done yesterday and is able to apply her make-up on.  Per patient, [facility] continues to work with her (stretching/ marching/ walking outside) and waits for the recommendation as to when she can start with cardiac rehab. She also mentioned that in the meantime, she will have to call her insurance provider to see if cardiac rehab is covered by her insurance because if not, she will go back to her personal trainer that she had prior to surgery, as stated. Patient reports that she is currently doing well with bathing and dressing herself.   [name] has a scheduled follow-up with primary care provider this afternoon.  Instructed her to mention to provider that she is not taking Tramadol because of its effect ("makes head feel funny") and ask for other pain medication order that she can use when needed.   She agreed to transition of care call next week. Patient denies any other needs or concerns at this moment. Encouraged to call [facility] management coordinator or 24-hour nurse line if necessary. Patient has the contact informations with her.   Plan: Transition of care call on 02/06/15. Follow-up any further orders from cardiologist. Follow-up alternative order to Tramadol for pain.    Broedy Osbourne A. Heath Tesler, BSN, RN-BC [place] Management Coordinator Cell: [phone]

## 2015-01-31 NOTE — H&P (Signed)
[name] is an 74 y.o. female.    Primary Cardiologist:Dr. Croitoru PCP: [name], MD  Chief Complaint: a fib with RVR  HPI:  74 year old female with hx CAD and CABG 01/04/15 (LIMA-LAD;VG-[place] intermediate, VG-PDA and clipping of atrial appendage). She has a hx of a fib, and SSS with PPM.  Also hx bil. SFA stenting by Dr. Adora Fridge.   Post op she did develop a fib RVR and treated with amiodarone and converted to SR.  She was restarted on xarelto after second episode post CABG.   She still had episodes of PAF during hospitalization.    On follow up visit 01/22/15 her PPM was interrogated and she still with PAF -the longest 52 hours. Heart rates generally well-controlled. She is ventricular pacing 0.3% of the time in atrial pacing 82% of the time. Device function is good and battery life is 8.5 years.  She had not gone back on her xarelto at discharge though it was on d/c meds.  Xarelto was resumed 01/22/15    Today she was seen by PCP at [facility] and in a fib not felling well.  Dr. Sallyanne Kuster talked to Dr. Sabra Heck and plan was to admit and plan for TEE/DCCV in AM with Dr. Stanford Breed.    Currently she states that her a fib began this AM. She became weak and could feel her heart racing.  She saw her PCP.  She has not missed any xarelto since starting it on the 13th.    Past Medical History  Diagnosis Date  . GLUCOSE INTOLERANCE 12/31/2009  . HYPERLIPIDEMIA 12/31/2009  . ANXIETY 12/31/2009  . DEPRESSION 12/31/2009  . MITRAL REGURGITATION 12/31/2009  . HYPERTENSION 12/31/2009  . CORONARY ARTERY DISEASE 12/31/2009  . MITRAL VALVE PROLAPSE 12/31/2009  . MENOPAUSE, EARLY 12/31/2009  . Unspecified urinary incontinence 12/31/2009  . ABDOMINAL PAIN, LOWER 12/31/2009  . DIVERTICULITIS, HX OF 12/31/2009  . Pacemaker 01/21/2012    MDT Adapta dual chamber  . Tachycardia-bradycardia syndrome ([place])     [name] 01/21/2012  . PAD (peripheral artery disease) ([place])   . Heart murmur       "used to say I did" (01/21/2012)  . OSTEOARTHRITIS, HAND 12/31/2009  . [place] DISEASE, CERVICAL 12/31/2009  . [place] DISEASE, LUMBAR 12/31/2009  . Symptomatic sinus bradycardia 01/22/2012  . PAF (paroxysmal atrial fibrillation) ([place]) 01/22/2012  . Sinus node dysfunction ([place]) 01/22/2012  . S/P placement of cardiac pacemaker, medtronic adapta 01/21/12 01/22/2012  . PVD (peripheral vascular disease), hx stents to bil SFAs 02/2010 01/22/2012  . PAD (peripheral artery disease) [facility])     Past Surgical History  Procedure Laterality Date  . Cardiac catheterization  2005    "negative" (01/21/2012)  . Oophorectomy  ~1979  . Partial colectomy  2010  . Carpal tunnel release  2008    "right hand/thumb; carpal tunnel repair; got rid of arthritis" (01/21/2012)  . Insert / replace / remove pacemaker  01/21/2012    initial placement  . Vaginal hysterectomy  1975  . Posterior cervical laminectomy  1985  . Peripheral arterial stent graft  2012; 2012    "LLE; RLE" (01/21/2012)  . Facelift, lower 2/3  1995    "mini" (01/21/2012)  . Augmentation mammaplasty  1983  . Pacemaker insertion  01/21/2012    Medtronic Adapta, model# ADDRL1, serial# [id number]  . Lower arterial examination  10/28/2011    R. SFA stent mild-moderate mixed density plaque with elevated velocities consistent with  50% diameter reduction. L. SFA stent moderate mixed denisty plaque at mid to distal level consistent with 50-69% diameter reduction.  . Cardiac catheterization  09/17/1999    Mild CAD involving proximal portion of LAD. Mitral valve prolapse w/ mitral regurg. No evidence of renal artery stenosis. Recommended readjustment of antihypertensive medications.  . Persantine myoview stress test  02/25/2010    No scintigraphic evidence of inducible myocardial ischemia. No persantine EKG changes. Non-diagnositc for ishcemia. Low-risk, normal scan.  Marland Kitchen Permanent pacemaker insertion N/A 01/21/2012    Procedure: PERMANENT PACEMAKER  INSERTION;  Surgeon: Sanda Klein, MD;  Location: [place] CATH LAB;  Service: Cardiovascular;  Laterality: N/A;  . Cardiac catheterization N/A 01/01/2015    Procedure: Left Heart Cath and Coronary Angiography;  Surgeon: Jettie Booze, MD;  Location: [place] CV LAB;  Service: Cardiovascular;  Laterality: N/A;  . Coronary artery bypass graft N/A 01/04/2015    Procedure: CORONARY ARTERY BYPASS GRAFTING (CABG) x 3 using left internal mammory artery and greater saphenous vein right leg harvested endoscopically.;  Surgeon: Grace Isaac, MD;  LIMA-LAD, SVG-RI, SVG-PDA  . Tee without cardioversion N/A 01/04/2015    Procedure: TRANSESOPHAGEAL ECHOCARDIOGRAM (TEE);  Surgeon: Grace Isaac, MD;  Location: [place];  Service: Open Heart Surgery;  Laterality: N/A;  . Clipping of atrial appendage N/A 01/04/2015    Procedure: CLIPPING OF ATRIAL APPENDAGE;  Surgeon: Grace Isaac, MD;  Location: [place];  Service: Open Heart Surgery;  Laterality: N/A;    Family History  Problem Relation Age of Onset  . Heart disease Brother     3 brothers with heart disease (2 also with CVA)  . Hypertension Brother   . Heart disease Mother   . Hypertension Mother   . Stroke Mother   . Stroke Father   . Hypertension Brother   . Stroke Brother    Social History:  reports that she has quit smoking. Her smoking use included Cigarettes. She has a 7.5 pack-year smoking history. She has never used smokeless tobacco. She reports that she drinks about 9.0 oz of alcohol per week. She reports that she does not use illicit drugs.  Allergies:  Allergies  Allergen Reactions  . Clonidine Derivatives     Lowers heart rate  . Plavix [Clopidogrel Bisulfate]     Joint pains  . Exforge [Amlodipine Besylate-Valsartan] Itching and Rash    Medications Prior to Admission  Medication Sig Dispense Refill  . amiodarone (PACERONE) 200 MG tablet Take 1 tablet (200 mg total) by mouth 2 (two) times daily. For one week then take  Amiodarone 200 mg by mouth daily thereafter 30 tablet 1  . amLODipine (NORVASC) 5 MG tablet Take 5 mg by mouth daily.    Marland Kitchen aspirin 81 MG EC tablet Take 81 mg by mouth daily.     Marland Kitchen atorvastatin (LIPITOR) 20 MG tablet Take 1 tablet (20 mg total) by mouth daily at 6 PM. 30 tablet 1  . cholecalciferol (VITAMIN D) 1000 UNITS tablet Take 1,000 Units by mouth daily.    . citalopram (CELEXA) 20 MG tablet Take 1 tablet (20 mg total) by mouth daily. 90 tablet 0  . cyanocobalamin 500 MCG tablet Take 1 tablet (500 mcg total) by mouth daily. 90 tablet 3  . metoprolol tartrate (LOPRESSOR) 25 MG tablet Take 1 tablet (25 mg total) by mouth 2 (two) times daily. 60 tablet 1  . Omega-3 Fatty Acids (FISH OIL) 1000 MG CAPS Take 1 capsule by mouth daily. Reported on  01/23/2015    . potassium chloride SA (K-DUR,KLOR-CON) 20 MEQ tablet Take 1 tablet (20 mEq total) by mouth once. For 10 days then stop. 10 tablet 0  . rivaroxaban (XARELTO) 20 MG TABS tablet Take 1 tablet (20 mg total) by mouth daily with supper. 90 tablet 1  . traMADol (ULTRAM) 50 MG tablet Take by mouth every 6 (six) hours as needed. Reported on 01/31/2015    . vitamin E 400 UNIT capsule Take 400 Units by mouth daily.      No results found for this or any previous visit (from the past 48 hour(s)). No results found.  ROS: General:no colds or fevers, no weight changes Skin:no rashes or ulcers HEENT:no blurred vision, no congestion CV:see HPI PUL:see HPI GI:no diarrhea constipation or melena, no indigestion GU:no hematuria, no dysuria MS:no joint pain, no claudication Neuro:no syncope, + lightheadedness this AM Endo:no diabetes, no thyroid disease   Blood pressure 128/68, pulse 117, temperature 97.7 F (36.5 C), temperature source Oral, resp. rate 16, height 5' 4.5" (1.638 m), weight 134 lb (60.782 kg), SpO2 99 %. PE: General:Pleasant affect, NAD while in bed-  HR up to 114 at times Skin:Warm and dry, brisk capillary refill HEENT:normocephalic,  sclera clear, mucus membranes moist Neck:supple, no JVD, no bruits  Heart:irreg irreg without murmur, gallup, rub or click Lungs:clear without rales, rhonchi, or wheezes [id number], non tender, + BS, do not palpate liver spleen or masses Ext:no lower ext edema, 2+ pedal pulses, 2+ radial pulses Neuro:alert and oriented X 3, MAE, follows commands, + facial symmetry    Assessment/Plan Principal Problem:   Atrial fibrillation with RVR (HCC) on amiodarone 200 daily and metoprolol 25 mg BID though it looks from phone call it may have been changed to toprol XL 100 once a day.   Daughter will call back what the pt is taking.   On xarelto since the 13th.  plan will be for TEE/DCCV --cha2ds2vasc 4   Active Problems:   PAF (paroxysmal atrial fibrillation) (HCC)   Anxiety   Essential hypertension- controlled   PVD (peripheral vascular disease), hx stents to bil SFAs 02/2010   S/P CABG x 3 01/04/15     [place] [place] R Nurse Practitioner Certified [place] Pager [phone] or after 5pm or weekends call [phone] 01/31/2015, 4:37 PM

## 2015-02-01 ENCOUNTER — Inpatient Hospital Stay (HOSPITAL_COMMUNITY): Payer: Commercial Managed Care - HMO | Admitting: Anesthesiology

## 2015-02-01 ENCOUNTER — Encounter (HOSPITAL_COMMUNITY): Payer: Self-pay | Admitting: *Deleted

## 2015-02-01 ENCOUNTER — Inpatient Hospital Stay (HOSPITAL_COMMUNITY): Payer: Commercial Managed Care - HMO

## 2015-02-01 ENCOUNTER — Encounter (HOSPITAL_COMMUNITY)
Admission: AD | Disposition: A | Discharge status: Home / Self Care | Payer: Self-pay | Source: Ambulatory Visit | Attending: Cardiovascular Disease

## 2015-02-01 DIAGNOSIS — I351 Nonrheumatic aortic (valve) insufficiency: Secondary | ICD-10-CM

## 2015-02-01 DIAGNOSIS — I10 Essential (primary) hypertension: Secondary | ICD-10-CM

## 2015-02-01 DIAGNOSIS — I4891 Unspecified atrial fibrillation: Secondary | ICD-10-CM

## 2015-02-01 HISTORY — PX: TEE WITHOUT CARDIOVERSION: SHX5443

## 2015-02-01 HISTORY — PX: CARDIOVERSION: SHX1299

## 2015-02-01 LAB — CBC
HCT: 37.3 % (ref 36.0–46.0)
Hemoglobin: 12.2 g/dL (ref 12.0–15.0)
MCH: 29.3 pg (ref 26.0–34.0)
MCHC: 32.7 g/dL (ref 30.0–36.0)
MCV: 89.7 fL (ref 78.0–100.0)
Platelets: 353 10*3/uL (ref 150–400)
RBC: 4.16 MIL/uL (ref 3.87–5.11)
RDW: 13.1 % (ref 11.5–15.5)
WBC: 6.9 10*3/uL (ref 4.0–10.5)

## 2015-02-01 LAB — LIPID PANEL
Cholesterol: 124 mg/dL (ref 0–200)
HDL: 34 mg/dL — ABNORMAL LOW (ref >40)
LDL Cholesterol: 68 mg/dL (ref 0–99)
Total CHOL/HDL Ratio: 3.6 RATIO
Triglycerides: 110 mg/dL (ref <150)
VLDL: 22 mg/dL (ref 0–40)

## 2015-02-01 LAB — BASIC METABOLIC PANEL
Anion gap: 6 (ref 5–15)
BUN: 10 mg/dL (ref 6–20)
CO2: 29 mmol/L (ref 22–32)
Calcium: 10.9 mg/dL — ABNORMAL HIGH (ref 8.9–10.3)
Chloride: 103 mmol/L (ref 101–111)
Creatinine, Ser: 0.87 mg/dL (ref 0.44–1.00)
GFR calc Af Amer: >60 mL/min (ref >60)
GFR calc non Af Amer: >60 mL/min (ref >60)
Glucose, Bld: 119 mg/dL — ABNORMAL HIGH (ref 65–99)
Potassium: 5.2 mmol/L — ABNORMAL HIGH (ref 3.5–5.1)
Sodium: 138 mmol/L (ref 135–145)

## 2015-02-01 LAB — HEMOGLOBIN A1C
Hgb A1c MFr Bld: 6.4 % — ABNORMAL HIGH (ref 4.8–5.6)
Mean Plasma Glucose: 137 mg/dL

## 2015-02-01 SURGERY — CARDIOVERSION
Anesthesia: Monitor Anesthesia Care

## 2015-02-01 MED ORDER — ADULT MULTIVITAMIN W/MINERALS CH
1.0000 | ORAL_TABLET | Freq: Every day | ORAL | Status: DC
  Administered 2015-02-01: 1 via ORAL
  Filled 2015-02-01: qty 1

## 2015-02-01 MED ORDER — ONDANSETRON HCL 4 MG/2ML IJ SOLN: 4.0000 mg | Freq: Once | INTRAMUSCULAR | Status: DC | PRN

## 2015-02-01 MED ORDER — AMIODARONE HCL 200 MG PO TABS: 200.0000 mg | ORAL_TABLET | Freq: Every day | ORAL | Status: DC

## 2015-02-01 MED ORDER — MIDAZOLAM HCL 5 MG/5ML IJ SOLN
INTRAMUSCULAR | Status: DC | PRN
  Administered 2015-02-01 (×2): 1 mg via INTRAVENOUS

## 2015-02-01 MED ORDER — BUTAMBEN-TETRACAINE-BENZOCAINE 2-2-14 % EX AERO
INHALATION_SPRAY | CUTANEOUS | Status: DC | PRN
  Administered 2015-02-01: 2 via TOPICAL

## 2015-02-01 MED ORDER — LACTATED RINGERS IV SOLN
INTRAVENOUS | Status: DC | PRN
  Administered 2015-02-01: 13:00:00 via INTRAVENOUS

## 2015-02-01 MED ORDER — PROPOFOL 10 MG/ML IV BOLUS
INTRAVENOUS | Status: DC | PRN
  Administered 2015-02-01 (×2): 10 mg via INTRAVENOUS
  Administered 2015-02-01: 20 mg via INTRAVENOUS
  Administered 2015-02-01: 10 mg via INTRAVENOUS

## 2015-02-01 MED ORDER — METOPROLOL TARTRATE 25 MG PO TABS: 25.0000 mg | ORAL_TABLET | Freq: Two times a day (BID) | ORAL | Status: DC

## 2015-02-01 MED ORDER — METOPROLOL TARTRATE 50 MG PO TABS: 50.0000 mg | ORAL_TABLET | Freq: Two times a day (BID) | ORAL | Status: DC

## 2015-02-01 MED ORDER — LIDOCAINE HCL (CARDIAC) 20 MG/ML IV SOLN
INTRAVENOUS | Status: DC | PRN
  Administered 2015-02-01: 30 mg via INTRATRACHEAL

## 2015-02-01 MED ORDER — FENTANYL CITRATE (PF) 100 MCG/2ML IJ SOLN: 25.0000 ug | INTRAMUSCULAR | Status: DC | PRN

## 2015-02-01 NOTE — Consult Note (Signed)
   [facility] CM Inpatient Consult   02/01/2015  SHEYNA LEEDY 01-02-1941 [id number] Patient is currently active with [facility] for chronic disease management services.  Patient has been engaged by a [employer].  Our community based plan of care has focused on disease management and community resource support.  Patient will receive a post discharge transition of care call and will be evaluated for monthly home visits for assessments and disease process education.  Will continue to follow hospital stay for ongoing care management needs.   Of note, [facility] Care Management services does not replace or interfere with any services that are needed or arranged by inpatient case management or social work.  For additional questions or referrals please contact: Natividad Brood, RN BSN [facility] Liaison  [phone] business mobile phone

## 2015-02-01 NOTE — Progress Notes (Signed)
Initial Nutrition Assessment  DOCUMENTATION CODES:   Not applicable  INTERVENTION:    MVI daily  [place] Breakfast Essentials with breakfast daily  NUTRITION DIAGNOSIS:   Inadequate oral intake related to poor appetite as evidenced by per patient/family report.  GOAL:   Patient will meet greater than or equal to 90% of their needs  MONITOR:   PO intake, Weight trends, Labs, I & O's  REASON FOR ASSESSMENT:   Malnutrition Screening Tool    ASSESSMENT:   74 year old female seen by PCP at [facility] in a fib not felling well. Sent to the hospital on 12/22 for TEE/DCCV the next AM.  Patient reports recent poor oral intake since surgery a few weeks ago. She has lost ~12 lbs since that time. She does not like Ensure, but does drink Medical illustrator at home. Nutrition focused physical exam completed.  No muscle or subcutaneous fat depletion noticed.  Diet Order:  Diet Heart Room service appropriate?: Yes; Fluid consistency:: Thin Diet - low sodium heart healthy  Skin:  Reviewed, no issues  Last BM:  12/21  Height:   Ht Readings from Last 1 Encounters:  01/31/15 5' 4.5" (1.638 m)    Weight:   Wt Readings from Last 1 Encounters:  02/01/15 133 lb 12.8 oz (60.691 kg)    Ideal Body Weight:  55.7 kg  BMI:  Body mass index is 22.62 kg/(m^2).  Estimated Nutritional Needs:   Kcal:  1500-1700  Protein:  75-85 gm  Fluid:  1.5-1.7 L  EDUCATION NEEDS:   No education needs identified at this time  Molli Barrows, [place], [place], [place] Pager [phone] After Hours Pager [phone]

## 2015-02-01 NOTE — Progress Notes (Signed)
  Echocardiogram 2D Echocardiogram has been performed.  Johny Chess 02/01/2015, 1:48 PM

## 2015-02-01 NOTE — CV Procedure (Signed)
See full TEE report in [employer]; patient sedated by anesthesia with versed 2 mg and diprovan 50 mg IV total; normal LV function; LAA clipped; no thrombus; patient subsequently underwent DCCV to atrial paced rhythm with 120  J; no immediate complications; continue amiodarone and xarelto. Kirk Ruths

## 2015-02-01 NOTE — Anesthesia Preprocedure Evaluation (Addendum)
Anesthesia Evaluation  Patient identified by MRN, date of birth, ID band Patient awake    Reviewed: Allergy & Precautions, Patient's Chart, lab work & pertinent test results  History of Anesthesia Complications Negative for: history of anesthetic complications  Airway Mallampati: II  TM Distance: >3 FB Neck ROM: Full    Dental  (+) Teeth Intact, Dental Advisory Given   Pulmonary neg shortness of breath, former smoker,    Pulmonary exam normal        Cardiovascular hypertension, (-) angina+ CAD and + Peripheral Vascular Disease  + pacemaker + Valvular Problems/Murmurs AI  Rhythm:Irregular Rate:Normal  TTE 01/02/15: Study Conclusions  - Left ventricle: The cavity size was normal. Systolic function wasnormal. The estimated ejection fraction was in the range of 60%to 65%. Wall motion was normal; there were no regional wallmotion abnormalities. - Aortic valve: There was mild regurgitation. - Atrial septum: No defect or patent foramen ovale was identified.  S/p CABG on 01/04/15    Neuro/Psych PSYCHIATRIC DISORDERS Anxiety Depression negative neurological ROS     GI/Hepatic negative GI ROS, Neg liver ROS,   Endo/Other    Renal/GU negative Renal ROS     Musculoskeletal   Abdominal   Peds  Hematology   Anesthesia Other Findings   Reproductive/Obstetrics                           Anesthesia Physical Anesthesia Plan  ASA: III  Anesthesia Plan: MAC   Post-op Pain Management:    Induction: Intravenous  Airway Management Planned: Nasal Cannula  Additional Equipment:   Intra-op Plan:   Post-operative Plan:   Informed Consent: I have reviewed the patients History and Physical, chart, labs and discussed the procedure including the risks, benefits and alternatives for the proposed anesthesia with the patient or authorized representative who has indicated his/her understanding and  acceptance.   Dental advisory given  Plan Discussed with: CRNA, Anesthesiologist and Surgeon  Anesthesia Plan Comments: (Discussed risks/benefits/alternatives to MAC sedation including need for ventilatory support, hypotension, need for conversion to general anesthesia.  All patient questions answered.  Patient wished to proceed.)       Anesthesia Quick Evaluation

## 2015-02-01 NOTE — Progress Notes (Signed)
SUBJECTIVE:  No complaints  OBJECTIVE:   Vitals:   Filed Vitals:   01/31/15 1627 01/31/15 2045 02/01/15 0500  BP: 128/68 123/73 131/71  Pulse: 117 93 76  Temp: 97.7 F (36.5 C) 98.6 F (37 C) 98.3 F (36.8 C)  TempSrc: Oral Oral Oral  Resp: 16 16 16   Height: 5' 4.5" (1.638 m)    Weight: 134 lb (60.782 kg)  133 lb 12.8 oz (60.691 kg)  SpO2: 99% 96% 97%   I&O's:   Intake/Output Summary (Last 24 hours) at 02/01/15 0732 Last data filed at 01/31/15 2200  Gross per 24 hour  Intake    600 ml  Output      0 ml  Net    600 ml   TELEMETRY: Reviewed telemetry pt in atrial fibrillation:     PHYSICAL EXAM General: Well developed, well nourished, in no acute distress Head: Eyes PERRLA, No xanthomas.   Normal cephalic and atramatic  Lungs:   Clear bilaterally to auscultation and percussion. Heart:   Irregularly irregular S1 S2 Pulses are 2+ & equal. Abdomen: Bowel sounds are positive, abdomen soft and non-tender without masses Extremities:   No clubbing, cyanosis or edema.  DP +1 Neuro: Alert and oriented X 3. Psych:  Good affect, responds appropriately   LABS: Basic Metabolic Panel:  Recent Labs  01/31/15 1900 02/01/15 0448  NA 137 138  K 4.4 5.2*  CL 102 103  CO2 26 29  GLUCOSE 137* 119*  BUN 12 10  CREATININE 0.81 0.87  CALCIUM 10.9* 10.9*  MG 2.1  --    Liver Function Tests:  Recent Labs  01/31/15 1900  AST 27  ALT 21  ALKPHOS 162*  BILITOT 0.5  PROT 6.4*  ALBUMIN 3.3*   No results for input(s): LIPASE, AMYLASE in the last 72 hours. CBC:  Recent Labs  01/31/15 1900 02/01/15 0448  WBC 6.3 6.9  NEUTROABS 3.1  --   HGB 12.3 12.2  HCT 37.2 37.3  MCV 89.4 89.7  PLT 383 353   Cardiac Enzymes:  Recent Labs  01/31/15 1900  TROPONINI <0.03   BNP: Invalid input(s): POCBNP D-Dimer: No results for input(s): DDIMER in the last 72 hours. Hemoglobin A1C: No results for input(s): HGBA1C in the last 72 hours. Fasting Lipid Panel:  Recent  Labs  02/01/15 0448  CHOL 124  HDL 34*  LDLCALC 68  TRIG 110  CHOLHDL 3.6   Thyroid Function Tests:  Recent Labs  01/31/15 1900  TSH 2.164   Anemia Panel: No results for input(s): VITAMINB12, FOLATE, FERRITIN, TIBC, IRON, RETICCTPCT in the last 72 hours. Coag Panel:   Lab Results  Component Value Date   INR 1.65* 01/31/2015   INR 1.37 01/04/2015   INR 1.05 01/03/2015    RADIOLOGY: Dg Chest 2 View  01/14/2015  CLINICAL DATA:  Status post cardiac surgery 2-1/2 weeks ago EXAM: CHEST  2 VIEW COMPARISON:  01/09/2015 FINDINGS: Left chest wall pacer device is noted with lead in the right atrial appendage and right ventricle. The heart size is normal. Status post median sternotomy and CABG procedure. There is a moderate left pleural effusion which is unchanged in volume from previous exam. Similar appearance of small right pleural effusion. IMPRESSION: Persistent bilateral pleural effusions, left greater than right. Electronically Signed   By: Kerby Moors M.D.   On: 01/14/2015 09:23   Dg Chest 2 View  01/09/2015  CLINICAL DATA:  Status post CABG. EXAM: CHEST  2 VIEW COMPARISON:  Chest radiograph from one day prior. FINDINGS: Stable intact and aligned median sternotomy wires. Stable configuration of 2 lead left subclavian pacemaker. Stable cardiomediastinal silhouette with mild cardiomegaly. No pneumothorax. Stable small right and small to moderate left pleural effusions. No overt pulmonary edema. Left greater than right bibasilar lung opacities, unchanged, favor atelectasis. IMPRESSION: 1. Stable mild cardiomegaly without pulmonary edema. 2. Stable small to moderate left and small right pleural effusions with associated bibasilar atelectasis. Electronically Signed   By: Ilona Sorrel M.D.   On: 01/09/2015 09:17   Dg Chest 2 View  01/02/2015  CLINICAL DATA:  Preop EXAM: CHEST  2 VIEW COMPARISON:  06/03/2012 FINDINGS: Cardiomediastinal silhouette is stable. Dual lead cardiac pacemaker is  unchanged in position. No acute infiltrate or pleural effusion. No pulmonary edema. Bony thorax is unremarkable. IMPRESSION: No active cardiopulmonary disease. Electronically Signed   By: Lahoma Crocker M.D.   On: 01/02/2015 08:43   Portable Chest X-ray 1 View  01/31/2015  CLINICAL DATA:  Preoperative exam, patient is scheduled for cardioversion. EXAM: PORTABLE CHEST 1 VIEW COMPARISON:  01/14/2015 FINDINGS: Postsurgical changes from prior CABG, left atrial appendage clipping, and dual lead cardiac pacemaker are stable. Cardiomediastinal silhouette is normal. Mediastinal contours appear intact. There is no evidence of focal airspace consolidation, pleural effusion or pneumothorax. Osseous structures are without acute abnormality. Soft tissues are grossly normal. Breast implant is noted. IMPRESSION: No radiographic evidence of acute cardiopulmonary abnormality. Electronically Signed   By: Fidela Salisbury M.D.   On: 01/31/2015 19:52   Dg Chest Port 1 View  01/08/2015  CLINICAL DATA:  Recent CABG. EXAM: PORTABLE CHEST 1 VIEW COMPARISON:  01/07/2015 FINDINGS: Sequelae of CABG and left atrial appendage clipping are again identified. Cardiac silhouette remains enlarged. Right jugular sheath is unchanged. Left subclavian approach dual lead pacemaker remains in place. Small bilateral pleural effusions have not significantly changed. There are bibasilar parenchymal lung opacities with mild interstitial prominence, also not significantly changed. No pneumothorax is identified. IMPRESSION: 1. Unchanged support devices. 2. Cardiomegaly with unchanged pleural effusions, atelectasis, and possibly mild interstitial edema. Electronically Signed   By: Logan Bores M.D.   On: 01/08/2015 08:11   Dg Chest Port 1 View  01/07/2015  CLINICAL DATA:  CABG. EXAM: PORTABLE CHEST 1 VIEW COMPARISON:  01/06/2015. FINDINGS: Right IJ sheath in stable position. Prior CABG. Cardiac pacer and lead tips in right atrium and right ventricle.  Left atrial appendage clip. Cardiomegaly with bilateral pulmonary interstitial prominence and bilateral effusions. Findings consistent with congestive heart failure. Findings have increased slightly from prior exam . Low lung volumes with basilar atelectasis. No pneumothorax. Left breast implant. IMPRESSION: 1. Right IJ sheath in stable position. 2. Prior CABG. Cardiac pacer and lead tips in right atrium and right ventricle. Left ventricular appendage clip noted. 3. Cardiomegaly with bilateral pulmonary interstitial prominence and bilateral pleural effusions. Findings consistent with congestive heart failure. Findings have increased slightly from prior exam. 4. Low lung volumes with basilar atelectasis . Electronically Signed   By: Marcello Moores  Register   On: 01/07/2015 07:14   Dg Chest Port 1 View  01/06/2015  CLINICAL DATA:  Post CABG EXAM: PORTABLE CHEST 1 VIEW COMPARISON:  01/05/2015 FINDINGS: Left-sided pacemaker and midline sternotomy wires overlies normal cardiac silhouette. Interval retraction of Swan-Ganz catheter. Removal of LEFT chest tube without pneumothorax. Atrial clip noted. Mediastinal drain removed. Persistent bilateral small effusions and basilar atelectasis are slightly worsened. IMPRESSION: Mild increase in bilateral basilar effusions and atelectasis. Removal of LEFT chest tube without  pneumothorax. Electronically Signed   By: Suzy Bouchard M.D.   On: 01/06/2015 07:34   Dg Chest Port 1 View  01/05/2015  CLINICAL DATA:  CABG 01/04/2015 EXAM: PORTABLE CHEST 1 VIEW COMPARISON:  01/04/2015 FINDINGS: Interval extubation with no change in LEFT basilar atelectasis. No new atelectasis. Swan-Ganz catheter remains unchanged. LEFT chest tube and mediastinal drain are unchanged. Removal of NG tube. Atrial clip noted. No pneumothorax. IMPRESSION: 1. Interval extubation without complication. 2. Stable remaining support apparatus. 3. LEFT chest tube in place without pneumothorax. 4. LEFT basilar  atelectasis small effusion. Electronically Signed   By: Suzy Bouchard M.D.   On: 01/05/2015 10:09   Dg Chest Port 1 View  01/04/2015  CLINICAL DATA:  CABG EXAM: PORTABLE CHEST 1 VIEW COMPARISON:  01/02/2015 FINDINGS: Postop CABG. Endotracheal tube is high at T2 and could be advanced approximately 3 cm. NG tube in the stomach. Swan-Ganz catheter tip in the right main pulmonary artery. Left chest tube in place. No pneumothorax. Left lower lobe atelectasis and small left effusion. Negative for heart failure. IMPRESSION: Endotracheal tube is high and could be advanced 3 cm Left lower lobe atelectasis and effusion.  No pneumothorax Electronically Signed   By: Franchot Gallo M.D.   On: 01/04/2015 13:17    Assessment/Plan Principal Problem:  Atrial fibrillation with RVR (HCC) on amiodarone 200 daily and metoprolol 25 mg BID though it looks from phone call it may have been changed to toprol XL 100 once a day. Daughter will call back what the pt is taking.  - On xarelto since the 13th. Seen by PCP yesterday and noted to be in atrial fibrillation. Last documented AV paced rhythm on 12/13 and started on Xarelto at that time for noted episodes of PAF on Pacer check. Will make NPO after MN . She has not missed any doses of Xarelto since 12/13. Will have pacer interrogated and if no afib until yesterday am then will plan just DCCV with no TEE.   - Continue Xarelto for cha2ds2vasc 4   Active Problems:  PAF (paroxysmal atrial fibrillation) (HCC)  Anxiety  Essential hypertension- controlled  PVD (peripheral vascular disease), hx stents to bil SFAs 02/2010  S/P CABG x 3 01/04/15    Sueanne Margarita, MD  02/01/2015  7:32 AM

## 2015-02-01 NOTE — Discharge Summary (Signed)
Discharge Summary   Patient ID: [name],  MRN: [id number], DOB/AGE: Jan 08, 1941 74 y.o.  Admit date: 01/31/2015 Discharge date: 02/01/2015  Primary Care Provider: Tawanna [name] Primary Cardiologist: Dr. Sallyanne [name]  Discharge Diagnoses Principal Problem:   Atrial fibrillation with RVR [facility]) Active Problems:   Anxiety   Essential hypertension   PAF (paroxysmal atrial fibrillation) (HCC)   PVD (peripheral vascular disease), hx stents to bil SFAs 02/2010   S/P CABG x 3 01/04/15   Allergies Allergies  Allergen Reactions  . Clonidine Derivatives     Lowers heart rate  . Plavix [Clopidogrel Bisulfate]     Joint pains  . Exforge [Amlodipine Besylate-Valsartan] Itching and Rash    Consultant: None  Procedures  TEE 02/01/2015 LV EF: 55% -  60%  ------------------------------------------------------------------- Indications:   Atrial fibrillation - 427.31.  ------------------------------------------------------------------- Study Conclusions  - Left ventricle: Systolic function was normal. The estimated ejection fraction was in the range of 55% to 60%. Wall motion was normal; there were no regional wall motion abnormalities. - Aortic valve: No evidence of vegetation. There was mild regurgitation. - Descending aorta: The descending aorta had moderate diffuse disease. - Mitral valve: No evidence of vegetation. - Left atrium: The atrium was mildly dilated. - Right atrium: The atrium was mildly dilated. No evidence of thrombus in the atrial cavity or appendage. - Atrial septum: No defect or patent foramen ovale was identified. There was an atrial septal aneurysm. - Tricuspid valve: No evidence of vegetation. There was moderate regurgitation. - Pulmonic valve: No evidence of vegetation.  Impressions:  - Normal LV function; mild biatrial enlargement; LAA ligated; no thrombus in LA cavity; mild AI; moderate TR.  CARDIOVERSION   underwent  DCCV to atrial paced rhythm with 120 J  History of Present Illness  74 year old female with hx CAD and CABG 01/04/15 (LIMA-LAD;VG-Ramus intermediate, VG-PDA and clipping of atrial appendage). She has a hx of a fib, and SSS with PPM. Also hx bil. SFA stenting by Dr. Adora [name]. Post op she did develop a fib RVR and treated with amiodarone and converted to SR. She was restarted on xarelto after second episode post CABG. She still had episodes of PAF during hospitalization.   On follow up visit 01/22/15 her PPM was interrogated and she still with PAF -the longest 52 hours. Heart rates generally well-controlled. She is ventricular pacing 0.3% of the time in atrial pacing 82% of the time. Device function is good and battery life is 8.5 years. She had not gone back on her xarelto at discharge though it was on d/c meds. Xarelto was resumed 01/22/15   she was seen by PCP 01/31/15 at [facility] and noted in a fib, not felling well. Dr. Sallyanne [name] talked to Dr. Sabra [name] and plan was to admit and plan for TEE/DCCV in AM with Dr. Stanford [name].   Currently she states that her a fib began this AM. She became weak and could feel her heart racing. She saw her PCP. She has not missed any xarelto since starting it on the 13th.    Hospital Course  The patient was admitted and resumed home medication including metoprolol 25mg  BID (? If she was taking Troprol XL as per telephone note 01/28/2015), amiodarone 200mg  and xarelto 20mg . Pacer interrogated and shows atrial fibrillation since 11/30. The pacer was undersensing some so difficult to say if she has been in afib chronically since then or in and out. The EKG on 12/13 showed AV paced rhythm. Since difficult to  assess if she had adequate coverage with Xarelto since she started it on 12/13. Thus plan made to proceed with TEE prior to DCCV. TEE showed LV ef of 55-60%, mild biatrial enlargement; LAA ligated; no thrombus in LA cavity; mild AI; moderate TR. S/p  successful DCCV at 120J. No immediate complications. She maintained sinus rhythm.   She has been seen by Dr. Radford [name] today and deemed ready for discharge home. All follow-up appointments have been scheduled. Discharge medications are listed below.   Will hold amlodipine for now due to soft low BP today, Resume current dose of metoprolol tartrate 25mg  BID and f/u in clinic next week with APP.    Discharge Vitals Blood pressure 87/55, pulse 73, temperature 98 F (36.7 C), temperature source Oral, resp. rate 16, height 5' 4.5" (1.638 m), weight 133 lb 12.8 oz (60.691 kg), SpO2 95 %.  Filed Weights   01/31/15 1627 02/01/15 0500  Weight: 134 lb (60.782 kg) 133 lb 12.8 oz (60.691 kg)    Labs  CBC  Recent Labs  01/31/15 1900 02/01/15 0448  WBC 6.3 6.9  NEUTROABS 3.1  --   HGB 12.3 12.2  HCT 37.2 37.3  MCV 89.4 89.7  PLT 383 0000000   Basic Metabolic Panel  Recent Labs  01/31/15 1900 02/01/15 0448  NA 137 138  K 4.4 5.2*  CL 102 103  CO2 26 29  GLUCOSE 137* 119*  BUN 12 10  CREATININE 0.81 0.87  CALCIUM 10.9* 10.9*  MG 2.1  --    Liver Function Tests  Recent Labs  01/31/15 1900  AST 27  ALT 21  ALKPHOS 162*  BILITOT 0.5  PROT 6.4*  ALBUMIN 3.3*   No results for input(s): LIPASE, AMYLASE in the last 72 hours. Cardiac Enzymes  Recent Labs  01/31/15 1900  TROPONINI <0.03   BNP Invalid input(s): POCBNP D-Dimer No results for input(s): DDIMER in the last 72 hours. Hemoglobin A1C  Recent Labs  01/31/15 1900  HGBA1C 6.4*   Fasting Lipid Panel  Recent Labs  02/01/15 0448  CHOL 124  HDL 34*  LDLCALC 68  TRIG 110  CHOLHDL 3.6   Thyroid Function Tests  Recent Labs  01/31/15 1900  TSH 2.164    Disposition  Pt is being discharged home today in good condition.  Follow-up Plans & Appointments  Follow-up Information    Follow up with [facility].   Specialty:  Cardiology   Why:  office will call with appoinment   Contact  information:   [address] [place] [phone]          Discharge Instructions    Diet - low sodium heart healthy    Complete by:  As directed      Increase activity slowly    Complete by:  As directed          F/u Labs/Studies: None  Discharge Medications    Medication List    STOP taking these medications        amLODipine 5 MG tablet  Commonly known as:  NORVASC      TAKE these medications        amiodarone 200 MG tablet  Commonly known as:  PACERONE  Take 1 tablet (200 mg total) by mouth 2 (two) times daily. For one week then take Amiodarone 200 mg by mouth daily thereafter     aspirin 81 MG EC tablet  Take 81 mg by mouth daily.  atorvastatin 20 MG tablet  Commonly known as:  LIPITOR  Take 1 tablet (20 mg total) by mouth daily at 6 PM.     cholecalciferol 1000 UNITS tablet  Commonly known as:  VITAMIN D  Take 1,000 Units by mouth daily.     citalopram 20 MG tablet  Commonly known as:  CELEXA  Take 1 tablet (20 mg total) by mouth daily.     cyanocobalamin 500 MCG tablet  Take 1 tablet (500 mcg total) by mouth daily.     Fish Oil 1000 MG Caps  Take 1 capsule by mouth daily. Reported on 01/23/2015     metoprolol tartrate 25 MG tablet  Commonly known as:  LOPRESSOR  Take 1 tablet (25 mg total) by mouth 2 (two) times daily.     rivaroxaban 20 MG Tabs tablet  Commonly known as:  XARELTO  Take 1 tablet (20 mg total) by mouth daily with supper.     traMADol 50 MG tablet  Commonly known as:  ULTRAM  Take by mouth every 6 (six) hours as needed. Reported on 01/31/2015     vitamin E 400 UNIT capsule  Take 400 Units by mouth daily.        Duration of Discharge Encounter   Greater than 30 minutes including physician time.  Signed, [place] Knauer PA-C 02/01/2015, 3:29 PM

## 2015-02-01 NOTE — Transfer of Care (Signed)
Immediate Anesthesia Transfer of Care Note  Patient: [name]  Procedure(s) Performed: Procedure(s): CARDIOVERSION (N/A) TRANSESOPHAGEAL ECHOCARDIOGRAM (TEE) (N/A)  Patient Location: PACU  Anesthesia Type:MAC  Level of Consciousness: sedated  Airway & Oxygen Therapy: Patient Spontanous Breathing and Patient connected to nasal cannula oxygen  Post-op Assessment: Report given to RN and Post -op Vital signs reviewed and stable  Post vital signs: Reviewed and stable  Last Vitals:  Filed Vitals:   02/01/15 0500 02/01/15 1211  BP: 131/71 165/95  Pulse: 76 86  Temp: 36.8 C 36.7 C  Resp: 16 20    Complications: No apparent anesthesia complications

## 2015-02-01 NOTE — Progress Notes (Signed)
Pacer interrogated and shows atrial fibrillation since 11/30.  The pacer was undersensing some so difficult to say if she has been in afib chronically since then or in and out.  The EKG on 12/13 showed AV paced rhythm.  Since difficult to assess if she had adequate coverage with Xarelto since she started it on 12/13 we will proceed with TEE prior to DCCV.

## 2015-02-01 NOTE — Anesthesia Postprocedure Evaluation (Signed)
Anesthesia Post Note  Patient: [name]  Procedure(s) Performed: Procedure(s) (LRB): CARDIOVERSION (N/A) TRANSESOPHAGEAL ECHOCARDIOGRAM (TEE) (N/A)  Patient location during evaluation: PACU Anesthesia Type: MAC Level of consciousness: awake and alert Pain management: pain level controlled Vital Signs Assessment: post-procedure vital signs reviewed and stable Respiratory status: spontaneous breathing, nonlabored ventilation, respiratory function stable and patient connected to nasal cannula oxygen Cardiovascular status: stable and blood pressure returned to baseline Anesthetic complications: no    Last Vitals:  Filed Vitals:   02/01/15 0500 02/01/15 1211  BP: 131/71 165/95  Pulse: 76 86  Temp: 36.8 C 36.7 C  Resp: 16 20    Last Pain:  Filed Vitals:   02/01/15 1214  PainSc: 0-No pain                 Catalina Gravel

## 2015-02-01 NOTE — Discharge Instructions (Addendum)
Information on my medicine - XARELTO (Rivaroxaban)  This medication education was reviewed with me or my healthcare representative as part of my discharge preparation.  The pharmacist that spoke with me during my hospital stay was:  Pat Patrick, [facility]  Why was Xarelto prescribed for you? Xarelto was prescribed for you to reduce the risk of a blood clot forming that can cause a stroke if you have a medical condition called atrial fibrillation (a type of irregular heartbeat).  What do you need to know about xarelto ? Take your Xarelto ONCE DAILY at the same time every day with your evening meal. If you have difficulty swallowing the tablet whole, you may crush it and mix in applesauce just prior to taking your dose.  Take Xarelto exactly as prescribed by your doctor and DO NOT stop taking Xarelto without talking to the doctor who prescribed the medication.  Stopping without other stroke prevention medication to take the place of Xarelto may increase your risk of developing a clot that causes a stroke.  Refill your prescription before you run out.  After discharge, you should have regular check-up appointments with your healthcare provider that is prescribing your Xarelto.  In the future your dose may need to be changed if your kidney function or weight changes by a significant amount.  What do you do if you miss a dose? If you are taking Xarelto ONCE DAILY and you miss a dose, take it as soon as you remember on the same day then continue your regularly scheduled once daily regimen the next day. Do not take two doses of Xarelto at the same time or on the same day.   Important Safety Information A possible side effect of Xarelto is bleeding. You should call your healthcare provider right away if you experience any of the following: ? Bleeding from an injury or your nose that does not stop. ? Unusual colored urine (red or dark brown) or unusual colored stools (red or  black). ? Unusual bruising for unknown reasons. ? A serious fall or if you hit your head (even if there is no bleeding).  Some medicines may interact with Xarelto and might increase your risk of bleeding while on Xarelto. To help avoid this, consult your healthcare provider or pharmacist prior to using any new prescription or non-prescription medications, including herbals, vitamins, non-steroidal anti-inflammatory drugs (NSAIDs) and supplements.  Atrial Fibrillation Atrial fibrillation is a type of heartbeat that is irregular or fast (rapid). If you have this condition, your heart keeps quivering in a weird (chaotic) way. This condition can make it so your heart cannot pump blood normally. Having this condition gives a person more risk for stroke, heart failure, and other heart problems. There are different types of atrial fibrillation. Talk with your doctor to learn about the type that you have. HOME CARE  Take over-the-counter and prescription medicines only as told by your doctor.  If your doctor prescribed a blood-thinning medicine, take it exactly as told. Taking too much of it can cause bleeding. If you do not take enough of it, you will not have the protection that you need against stroke and other problems.  Do not use any tobacco products. These include cigarettes, chewing tobacco, and e-cigarettes. If you need help quitting, ask your doctor.  If you have apnea (obstructive sleep apnea), manage it as told by your doctor.  Do not drink alcohol.  Do not drink beverages that have caffeine. These include coffee, soda, and tea.  Maintain a healthy weight. Do not use diet pills unless your doctor says they are safe for you. Diet pills may make heart problems worse.  Follow diet instructions as told by your doctor.  Exercise regularly as told by your doctor.  Keep all follow-up visits as told by your doctor. This is important. GET HELP IF:  You notice a change in the speed,  rhythm, or strength of your heartbeat.  You are taking a blood-thinning medicine and you notice more bruising.  You get tired more easily when you move or exercise. GET HELP RIGHT AWAY IF:  You have pain in your chest or your belly (abdomen).  You have sweating or weakness.  You feel sick to your stomach (nauseous).  You notice blood in your throw up (vomit), poop (stool), or pee (urine).  You are short of breath.  You suddenly have swollen feet and ankles.  You feel dizzy.  Your suddenly get weak or numb in your face, arms, or legs, especially if it happens on one side of your body.  You have trouble talking, trouble understanding, or both.  Your face or your eyelid droops on one side. These symptoms may be an emergency. Do not wait to see if the symptoms will go away. Get medical help right away. Call your local emergency services (911 in the [place] Do not drive yourself to the hospital.   This information is not intended to replace advice given to you by your health care provider. Make sure you discuss any questions you have with your health care provider.   Document Released: 11/05/2007 Document Revised: 10/17/2014 Document Reviewed: 05/23/2014 Elsevier Interactive Patient Education 2016 [employer].  This website has more information on Xarelto: [email or web address]   Will hold amlodipine for now due to soft low BP today, Resume current dose of metoprolol tartrate 25mg  BID and f/u in clinic next week with APP.

## 2015-02-04 ENCOUNTER — Encounter (HOSPITAL_COMMUNITY): Payer: Self-pay | Admitting: Cardiology

## 2015-02-05 ENCOUNTER — Telehealth: Payer: Self-pay | Admitting: Cardiovascular Disease

## 2015-02-05 ENCOUNTER — Other Ambulatory Visit: Payer: Self-pay | Admitting: *Deleted

## 2015-02-05 NOTE — Patient Outreach (Signed)
[place]) Care Management  02/05/2015  [name] 03-25-1940 [id number]   Assessment: Transition of care - week 1 (first attempt) Patient noted to have recent hospitalization on 12/22 - 12/23 for Atrial fibrillation after seeing her primary care provider for a post hospital follow-up appointment.  Call placed and spoke with patient very briefly. Patient told care management coordinator that this is not a good time to talk and she is not be able to talk at this moment.  Patient agreed for care management coordinator to call back in the morning.  Plan: Will give patient a call back tomorrow 02/06/15 for transition of care follow-up call.  Tadeusz Stahl A. Thornton Dohrmann, BSN, RN-BC [place] Management Coordinator Cell: [phone]

## 2015-02-05 NOTE — Telephone Encounter (Signed)
D/C phone call .Marland Kitchen Appt is on 02/13/15 at 11am w/ Lauretta Chester at the [facility] office .Marland Kitchen  Thanks

## 2015-02-05 NOTE — Telephone Encounter (Signed)
Patient contacted regarding discharge from [facility] on 02/01/2015.  Patient understands to follow up with provider Lauretta Chester on 02/13/2015 at 11:00 at [facility]. Patient understands discharge instructions? Yes Patient understands medications and regiment? Yes Patient understands to bring all medications to this visit? Yes

## 2015-02-06 ENCOUNTER — Ambulatory Visit: Payer: Self-pay | Admitting: *Deleted

## 2015-02-06 ENCOUNTER — Other Ambulatory Visit: Payer: Self-pay | Admitting: *Deleted

## 2015-02-06 ENCOUNTER — Telehealth: Payer: Self-pay | Admitting: Cardiovascular Disease

## 2015-02-06 NOTE — Patient Outreach (Signed)
[facility]) Care Management  02/06/2015  [name] 10-05-1940 [id number]   Assessment: Transition of care follow-up week 59  74 year old female with recent admission to hospital (12/22 -12/23) for atrial fibrillation and cardioversion. Transition of care call completed. Patient reports "feeling pretty good". Denies any further rapid or irregular heart beat. Patient expressed understanding of medications and hospital stay.  She states "nothing has changed, it's all the same from your last visit" as stated by patient to care management coordinator. Patient states she has all her medication supplies, takes it as directed and as managed by her daughter.  Patient has a scheduled follow up appointment with cardiology (R. Barrett, PAC) on 1/4 17 at 11 am.   She has a follow-up visit with cardiothoracic surgeon (Dr. Servando Snare) on 02/14/15. Patient reports she does have transportation to her follow-up appointment provided by daughter and not using [employer] transport. She denies urgent needs or concerns at this time. She agreed to routine home visit next week.  Encouraged to call [facility], care management coordinator or 24-hour nurse line as needed. Contact informations with patient.   Plan: Routine home visit on 02/12/15.  Ardelle Haliburton A. Kevontay Burks, BSN, RN-BC [place] Management Coordinator Cell: [phone]

## 2015-02-06 NOTE — Telephone Encounter (Signed)
Pt discharged from the hospital on 02-01-15. He would like order for physical therapy.

## 2015-02-06 NOTE — Telephone Encounter (Signed)
Pt will be discharged from [facility] this week. Would like to have an order for Cardiac Rehab at [facility].

## 2015-02-07 NOTE — Telephone Encounter (Signed)
A little confused, PT or rehab? If she is able to ambulate and travel, rehab is appropriate.

## 2015-02-07 NOTE — Telephone Encounter (Signed)
Left message with home health to call back to confirm they are requesting that she be referred to Cardiac Rehab at [facility]

## 2015-02-08 NOTE — Telephone Encounter (Signed)
Orders already received from the Thoracic surgeon for [place] after recent hospitalization. No call back is necessary.

## 2015-02-12 ENCOUNTER — Other Ambulatory Visit: Payer: Self-pay

## 2015-02-12 ENCOUNTER — Ambulatory Visit: Payer: Self-pay | Admitting: *Deleted

## 2015-02-12 NOTE — Patient Outreach (Signed)
Unsuccessful attempt made to contact patient via telephone for community care coordination. HIPPA compliant message left for patient with this RNCM's telephone contact.    Plan: Schedule follow up by patient's primary care manager upon her return on February 14, 2015

## 2015-02-13 ENCOUNTER — Encounter: Payer: Self-pay | Admitting: Physician Assistant

## 2015-02-13 ENCOUNTER — Telehealth: Payer: Self-pay | Admitting: Cardiovascular Disease

## 2015-02-13 ENCOUNTER — Other Ambulatory Visit: Payer: Self-pay | Admitting: Cardiothoracic Surgery

## 2015-02-13 ENCOUNTER — Ambulatory Visit (INDEPENDENT_AMBULATORY_CARE_PROVIDER_SITE_OTHER): Payer: Commercial Managed Care - HMO | Admitting: Physician Assistant

## 2015-02-13 VITALS — BP 130/76 | HR 88 | Ht 64.5 in | Wt 137.0 lb

## 2015-02-13 DIAGNOSIS — R42 Dizziness and giddiness: Secondary | ICD-10-CM | POA: Diagnosis not present

## 2015-02-13 DIAGNOSIS — I480 Paroxysmal atrial fibrillation: Secondary | ICD-10-CM

## 2015-02-13 DIAGNOSIS — Z7901 Long term (current) use of anticoagulants: Secondary | ICD-10-CM | POA: Diagnosis not present

## 2015-02-13 DIAGNOSIS — Z951 Presence of aortocoronary bypass graft: Secondary | ICD-10-CM

## 2015-02-13 MED ORDER — ATORVASTATIN CALCIUM 20 MG PO TABS: 20.0000 mg | ORAL_TABLET | Freq: Every day | ORAL | Status: DC

## 2015-02-13 MED ORDER — METOPROLOL TARTRATE 25 MG PO TABS: 25.0000 mg | ORAL_TABLET | Freq: Two times a day (BID) | ORAL | Status: DC

## 2015-02-13 NOTE — Progress Notes (Signed)
Cardiology Office Note   Date:  02/13/2015   ID:  [name], DOB 12/26/1940, MRN [id number]  PCP:  [name] Solo, MD  Cardiologist:  Dr [name] Snow, PA-C   Chief Complaint  Patient presents with  . Appointment    steady gaining energy back.     History of Present Illness: [name] is a 75 y.o. female with a history of nl EF, MDT PPM, CABG 12/2014, PAD w/ SFA stents, afib after CABG on Xarelto  D/C 12/23 after admit for afib RVR s/p TEE/DCCV  [name] presents for post hospital follow-up  Since discharge from the hospital after the cardioversion, she has done well. She has had no more palpitations. She has had no shortness of breath. From an arrhythmia standpoint, she has been fine.  She is still recovering from the surgery itself. She is gaining some energy but has also noted some weakness. Most the weakness is orthostatic in nature. When asked about water consumption, she admits that she does not drink a lot of water. She recognizes that her symptoms have been orthostatic in nature. She fell recently, landing back and hitting her backside and her head. She was trying to come up some steps after very busy day and just became very weak. She denies any loss of consciousness. She admits that she had had very little to drink that day. She had one other episode of presyncope that was also orthostatic in nature. She is doing well and increasing her activity per the home physical therapist and is ready to start attending cardiac rehabilitation. She was working with a Physiological scientist at [employer] and her co-pay for the cardiac rehabilitation may be very high so she wants to get back to her trainers quickly as possible   Past Medical History  Diagnosis Date  . GLUCOSE INTOLERANCE 12/31/2009  . HYPERLIPIDEMIA 12/31/2009  . ANXIETY 12/31/2009  . DEPRESSION 12/31/2009  . MITRAL REGURGITATION 12/31/2009  . HYPERTENSION 12/31/2009  . CORONARY ARTERY DISEASE  12/31/2009  . MITRAL VALVE PROLAPSE 12/31/2009  . MENOPAUSE, EARLY 12/31/2009  . Unspecified urinary incontinence 12/31/2009  . ABDOMINAL PAIN, LOWER 12/31/2009  . DIVERTICULITIS, HX OF 12/31/2009  . Pacemaker 01/21/2012    MDT Adapta dual chamber  . Tachycardia-bradycardia syndrome ([place])     [name] 01/21/2012  . PAD (peripheral artery disease) ([place])   . Heart murmur     "used to say I did" (01/21/2012)  . OSTEOARTHRITIS, HAND 12/31/2009  . [place] DISEASE, CERVICAL 12/31/2009  . [place] DISEASE, LUMBAR 12/31/2009  . Symptomatic sinus bradycardia 01/22/2012  . PAF (paroxysmal atrial fibrillation) ([place]) 01/22/2012  . Sinus node dysfunction ([place]) 01/22/2012  . PVD (peripheral vascular disease), hx stents to bil SFAs 02/2010 01/22/2012  . PAD (peripheral artery disease) ([place])   . Atrial fibrillation with RVR [facility])     Past Surgical History  Procedure Laterality Date  . Cardiac catheterization  2005    "negative" (01/21/2012)  . Oophorectomy  ~1979  . Partial colectomy  2010  . Carpal tunnel release  2008    "right hand/thumb; carpal tunnel repair; got rid of arthritis" (01/21/2012)  . Insert / replace / remove pacemaker  01/21/2012    initial placement  . Vaginal hysterectomy  1975  . Posterior cervical laminectomy  1985  . Peripheral arterial stent graft  2012; 2012    "LLE; RLE" (01/21/2012)  . Facelift, lower 2/3  1995    "mini" (01/21/2012)  . Augmentation  mammaplasty  1983  . Pacemaker insertion  01/21/2012    Medtronic Adapta, model# ADDRL1, serial# [id number]  . Lower arterial examination  10/28/2011    R. SFA stent mild-moderate mixed density plaque with elevated velocities consistent with 50% diameter reduction. L. SFA stent moderate mixed denisty plaque at mid to distal level consistent with 50-69% diameter reduction.  . Cardiac catheterization  09/17/1999    Mild CAD involving proximal portion of LAD. Mitral valve prolapse w/ mitral regurg. No evidence of renal artery  stenosis. Recommended readjustment of antihypertensive medications.  . Persantine myoview stress test  02/25/2010    No scintigraphic evidence of inducible myocardial ischemia. No persantine EKG changes. Non-diagnositc for ishcemia. Low-risk, normal scan.  Marland Kitchen Permanent pacemaker insertion N/A 01/21/2012    Procedure: PERMANENT PACEMAKER INSERTION;  Surgeon: Sanda Klein, MD;  Location: [place] CATH LAB;  Service: Cardiovascular;  Laterality: N/A;  . Cardiac catheterization N/A 01/01/2015    Procedure: Left Heart Cath and Coronary Angiography;  Surgeon: Jettie Booze, MD;  Location: [place] CV LAB;  Service: Cardiovascular;  Laterality: N/A;  . Coronary artery bypass graft N/A 01/04/2015    Procedure: CORONARY ARTERY BYPASS GRAFTING (CABG) x 3 using left internal mammory artery and greater saphenous vein right leg harvested endoscopically.;  Surgeon: Grace Isaac, MD;  LIMA-LAD, SVG-RI, SVG-PDA  . Tee without cardioversion N/A 01/04/2015    Procedure: TRANSESOPHAGEAL ECHOCARDIOGRAM (TEE);  Surgeon: Grace Isaac, MD;  Location: [place];  Service: Open Heart Surgery;  Laterality: N/A;  . Clipping of atrial appendage N/A 01/04/2015    Procedure: CLIPPING OF ATRIAL APPENDAGE;  Surgeon: Grace Isaac, MD;  Location: [place];  Service: Open Heart Surgery;  Laterality: N/A;  . Cardioversion N/A 02/01/2015    Procedure: CARDIOVERSION;  Surgeon: Lelon Perla, MD;  Location: [facility] ENDOSCOPY;  Service: Cardiovascular;  Laterality: N/A;  . Tee without cardioversion N/A 02/01/2015    Procedure: TRANSESOPHAGEAL ECHOCARDIOGRAM (TEE);  Surgeon: Lelon Perla, MD;  Location: [facility] ENDOSCOPY;  Service: Cardiovascular;  Laterality: N/A;    Current Outpatient Prescriptions  Medication Sig Dispense Refill  . amiodarone (PACERONE) 200 MG tablet Take 1 tablet (200 mg total) by mouth daily. \ 30 tablet 3  . aspirin 81 MG EC tablet Take 81 mg by mouth daily.     Marland Kitchen atorvastatin (LIPITOR) 20 MG tablet  Take 1 tablet (20 mg total) by mouth daily at 6 PM. 90 tablet 1  . cholecalciferol (VITAMIN D) 1000 UNITS tablet Take 1,000 Units by mouth daily.    . citalopram (CELEXA) 20 MG tablet Take 1 tablet (20 mg total) by mouth daily. 90 tablet 0  . cyanocobalamin 500 MCG tablet Take 1 tablet (500 mcg total) by mouth daily. 90 tablet 3  . metoprolol tartrate (LOPRESSOR) 25 MG tablet Take 1 tablet (25 mg total) by mouth 2 (two) times daily. 180 tablet 1  . Omega-3 Fatty Acids (FISH OIL) 1000 MG CAPS Take 1 capsule by mouth daily. Reported on 01/23/2015    . rivaroxaban (XARELTO) 20 MG TABS tablet Take 1 tablet (20 mg total) by mouth daily with supper. 90 tablet 1  . traMADol (ULTRAM) 50 MG tablet Take by mouth every 6 (six) hours as needed. Reported on 01/31/2015    . vitamin E 400 UNIT capsule Take 400 Units by mouth daily.     No current facility-administered medications for this visit.    Allergies:   Clonidine derivatives; Plavix; and Exforge    Social History:  The patient  reports that she has quit smoking. Her smoking use included Cigarettes. She has a 7.5 pack-year smoking history. She has never used smokeless tobacco. She reports that she drinks about 9.0 oz of alcohol per week. She reports that she does not use illicit drugs.   Family History:  The patient's family history includes Heart disease in her brother and mother; Hypertension in her brother, brother, and mother; Stroke in her brother, father, and mother.    ROS:  Please see the history of present illness. All other systems are reviewed and negative.    PHYSICAL EXAM: VS:  BP 130/76 mmHg  Pulse 88  Ht 5' 4.5" (1.638 m)  Wt 137 lb (62.143 kg)  BMI 23.16 kg/m2  SpO2 94% , BMI Body mass index is 23.16 kg/(m^2). GEN: Well nourished, well developed, female in no acute distress HEENT: normal for age  Neck: no JVD, no carotid bruit, no masses Cardiac: RRR; 2/6 murmur, no rubs, or gallops Respiratory:  clear to auscultation  bilaterally, normal work of breathing GI: soft, nontender, nondistended, + BS MS: no deformity or atrophy; no edema; distal pulses are 2+ in all 4 extremities  Skin: warm and dry, no rash; incision is healing and well Neuro:  Strength and sensation are intact Psych: euthymic mood, full affect   EKG:  EKG is ordered today. The ekg ordered today demonstrates atrial pacing, V sensing   Recent Labs: 01/31/2015: ALT 21; Magnesium 2.1; TSH 2.164 02/01/2015: BUN 10; Creatinine, Ser 0.87; Hemoglobin 12.2; Platelets 353; Potassium 5.2*; Sodium 138    Lipid Panel    Component Value Date/Time   CHOL 124 02/01/2015 0448   TRIG 110 02/01/2015 0448   HDL 34* 02/01/2015 0448   CHOLHDL 3.6 02/01/2015 0448   VLDL 22 02/01/2015 0448   LDLCALC 68 02/01/2015 0448     Wt Readings from Last 3 Encounters:  02/13/15 137 lb (62.143 kg)  02/01/15 133 lb 12.8 oz (60.691 kg)  01/22/15 131 lb 8 oz (59.648 kg)     Other studies Reviewed: Additional studies/ records that were reviewed today include: Hospital records, office notes.  ASSESSMENT AND PLAN:  1.  Atrial fibrillation: She is maintaining sinus rhythm on amiodarone and metoprolol. We will refill the metoprolol today  2. Orthostatic dizziness: Orthostatic vital signs were checked in the office today and upon first standing, her blood pressure went from 126 down to [id number] systolic. Her heart rate remained in the 80s. As she remained standing, her blood pressure came back up. She was asymptomatic with this. She is encouraged to make sure she drinks a liter and a half of water a day. It was also noted that her protein level was a little low on her recent labs and she is encouraged to make sure she gets adequate protein.  3. Chronic anticoagulation: Continue Xarelto   Current medicines are reviewed at length with the patient today.  The patient does not have concerns regarding medicines.  The following changes have been made:  no change  Labs/  tests ordered today include:   Orders Placed This Encounter  Procedures  . EKG 12-Lead    Disposition:   FU with Dr. Sallyanne Kuster  Signed, Lenoard Aden  02/13/2015 2:30 PM    [address], [place], [place]  [address] Phone: [phone]; Fax: [phone]

## 2015-02-13 NOTE — Progress Notes (Signed)
Thanks

## 2015-02-13 NOTE — Patient Instructions (Signed)
Medication Instructions:  Your physician recommends that you continue on your current medications as directed. Please refer to the Current Medication list given to you today.   Labwork: None ordered  Testing/Procedures: None ordered  Follow-Up: Your physician recommends that you schedule a follow-up appointment in: 3 months with Dr.Croitoru   Any Other Special Instructions Will Be Listed Below (If Applicable). Please liberalize fluids, drink 1.5 liters of water daily    If you need a refill on your cardiac medications before your next appointment, please call your pharmacy.

## 2015-02-13 NOTE — Telephone Encounter (Signed)
Yes, OK to renew Home Health

## 2015-02-13 NOTE — Telephone Encounter (Signed)
Claiborne Billings is calling from [facility] to see if this patient can have home care orders renewed - at this time, she is just requesting a verbal for PT reassessment due to pt's recent hospital admission.  Pt seeing Suanne Marker today - will route & see if anything else advised.

## 2015-02-14 ENCOUNTER — Ambulatory Visit: Payer: Self-pay | Admitting: *Deleted

## 2015-02-14 ENCOUNTER — Ambulatory Visit
Admission: RE | Admit: 2015-02-14 | Discharge: 2015-02-14 | Disposition: A | Discharge status: Home / Self Care | Payer: Commercial Managed Care - HMO | Source: Ambulatory Visit | Attending: Cardiothoracic Surgery | Admitting: Cardiothoracic Surgery

## 2015-02-14 ENCOUNTER — Other Ambulatory Visit: Payer: Self-pay | Admitting: Cardiovascular Disease

## 2015-02-14 ENCOUNTER — Encounter: Payer: Self-pay | Admitting: Cardiothoracic Surgery

## 2015-02-14 ENCOUNTER — Ambulatory Visit (INDEPENDENT_AMBULATORY_CARE_PROVIDER_SITE_OTHER): Payer: Self-pay | Admitting: Cardiothoracic Surgery

## 2015-02-14 VITALS — BP 115/72 | HR 95 | Resp 20 | Ht 64.5 in | Wt 136.0 lb

## 2015-02-14 DIAGNOSIS — Z951 Presence of aortocoronary bypass graft: Secondary | ICD-10-CM

## 2015-02-14 NOTE — Telephone Encounter (Signed)
REFILL 

## 2015-02-14 NOTE — Progress Notes (Signed)
[address]       ,[place] [address]             [phone]      Garnett C Nesmith [place] Medical Record [id number] Date of Birth: [birth year]  Referring: Sanda Klein, MD Primary Care: [name], MD  Chief Complaint:   POST OP FOLLOW UP 01/04/2015 OPERATIVE REPORT PREOPERATIVE DIAGNOSIS: Recent onset of unstable angina and anticoagulation for history of atrial fibrillation in the past. POSTOPERATIVE DIAGNOSIS: Recent onset of unstable angina and anticoagulation for history of atrial fibrillation in the past. SURGICAL PROCEDURE: Coronary artery bypass grafting x3 with left internal mammary to the left anterior descending coronary artery, reverse saphenous vein graft to the proximal posterior descending coronary artery, and reverse saphenous vein graft to large intermediate branch of the lateral wall of the heart with right thigh and calf endovein harvesting.Placement of 35 mm Acticure Atrial Clip SURGEON: Lanelle Bal, MD  History of Present Illness:     Patient returns after recent coronary artery bypass grafting. She appears to be maintaining sinus rhythm, disease has any symptoms of atrial fibrillation. She gradually gaining strength and is interested in her neck rehabilitation with the exception of the cost. She's had no recurrent angina or evidence of congestive heart failure.     Past Medical History  Diagnosis Date  . GLUCOSE INTOLERANCE 12/31/2009  . HYPERLIPIDEMIA 12/31/2009  . ANXIETY 12/31/2009  . DEPRESSION 12/31/2009  . MITRAL REGURGITATION 12/31/2009  . HYPERTENSION 12/31/2009  . CORONARY ARTERY DISEASE 12/31/2009  . MITRAL VALVE PROLAPSE 12/31/2009  . MENOPAUSE, EARLY 12/31/2009  . Unspecified urinary incontinence 12/31/2009  . ABDOMINAL PAIN, LOWER 12/31/2009  . DIVERTICULITIS, HX OF 12/31/2009  . Pacemaker 01/21/2012    MDT Adapta dual chamber  . Tachycardia-bradycardia syndrome ([place])     [name] 01/21/2012    . PAD (peripheral artery disease) ([place])   . Heart murmur     "used to say I did" (01/21/2012)  . OSTEOARTHRITIS, HAND 12/31/2009  . [place] DISEASE, CERVICAL 12/31/2009  . [place] DISEASE, LUMBAR 12/31/2009  . Symptomatic sinus bradycardia 01/22/2012  . PAF (paroxysmal atrial fibrillation) ([place]) 01/22/2012  . Sinus node dysfunction ([place]) 01/22/2012  . PVD (peripheral vascular disease), hx stents to bil SFAs 02/2010 01/22/2012  . PAD (peripheral artery disease) ([place])   . Atrial fibrillation with RVR ([facility])      History  Smoking status  . Former Smoker -- 0.75 packs/day for 10 years  . Types: Cigarettes  Smokeless tobacco  . Never Used    Comment: 01/21/2012 "quit smoking ~ 2002"    History  Alcohol Use  . 9.0 oz/week  . 15 Shots of liquor per week    Comment: 01/21/2012 "3 times/wk I have a couple mixed drinks"     Allergies  Allergen Reactions  . Clonidine Derivatives     Lowers heart rate  . Plavix [Clopidogrel Bisulfate]     Joint pains  . Exforge [Amlodipine Besylate-Valsartan] Itching and Rash    Current Outpatient Prescriptions  Medication Sig Dispense Refill  . amiodarone (PACERONE) 200 MG tablet Take 1 tablet (200 mg total) by mouth daily. \ 30 tablet 3  . aspirin 81 MG EC tablet Take 81 mg by mouth daily.     Marland Kitchen atorvastatin (LIPITOR) 20 MG tablet Take 1 tablet (20 mg total) by mouth daily at 6 PM. 90 tablet 1  . cholecalciferol (VITAMIN D) 1000 UNITS tablet Take 1,000 Units by mouth daily.    Marland Kitchen  citalopram (CELEXA) 20 MG tablet Take 1 tablet (20 mg total) by mouth daily. 90 tablet 0  . cyanocobalamin 500 MCG tablet Take 1 tablet (500 mcg total) by mouth daily. 90 tablet 3  . metoprolol tartrate (LOPRESSOR) 25 MG tablet Take 1 tablet (25 mg total) by mouth 2 (two) times daily. 180 tablet 1  . Omega-3 Fatty Acids (FISH OIL) 1000 MG CAPS Take 1 capsule by mouth daily. Reported on 01/23/2015    . rivaroxaban (XARELTO) 20 MG TABS tablet Take 1 tablet (20 mg total) by  mouth daily with supper. 90 tablet 1  . traMADol (ULTRAM) 50 MG tablet Take by mouth every 6 (six) hours as needed. Reported on 01/31/2015    . vitamin E 400 UNIT capsule Take 400 Units by mouth daily.     No current facility-administered medications for this visit.       Physical Exam: Resp 20  Ht 5' 4.5" (1.638 m)  Wt 136 lb (61.689 kg)  BMI 22.99 kg/m2  SpO2   General appearance: alert, cooperative and no distress Neurologic: intact Heart: regular rate and rhythm, S1, S2 normal, no murmur, click, rub or gallop Lungs: clear to auscultation bilaterally Abdomen: soft, non-tender; bowel sounds normal; no masses,  no organomegaly Extremities: extremities normal, atraumatic, no cyanosis or edema and Homans sign is negative, no sign of DVT Wound: Sternum is stable and well healed   Diagnostic Studies & Laboratory data:     Recent Radiology Findings:   Dg Chest 2 View  02/14/2015  CLINICAL DATA:  CABG.  Shortness of breath. EXAM: CHEST  2 VIEW COMPARISON:  None. FINDINGS: Cardiac pacer with lead tips in right atrium right ventricle. Prior CABG. Left atrial appendage clip. Heart size stable. No focal infiltrate. No pleural effusion or pneumothorax. No acute bony abnormality. Breast implants. IMPRESSION: 1. Prior CABG. Cardiac pacer with lead tips in right atrium right ventricle. Heart size stable. 2. No acute pulmonary infiltrate. Electronically Signed   By: Marcello Moores  Register   On: 02/14/2015 12:58      Recent Lab Findings: Lab Results  Component Value Date   WBC 6.9 02/01/2015   HGB 12.2 02/01/2015   HCT 37.3 02/01/2015   PLT 353 02/01/2015   GLUCOSE 119* 02/01/2015   CHOL 124 02/01/2015   TRIG 110 02/01/2015   HDL 34* 02/01/2015   LDLCALC 68 02/01/2015   ALT 21 01/31/2015   AST 27 01/31/2015   NA 138 02/01/2015   K 5.2* 02/01/2015   CL 103 02/01/2015   CREATININE 0.87 02/01/2015   BUN 10 02/01/2015   CO2 29 02/01/2015   TSH 2.164 01/31/2015   INR 1.65* 01/31/2015    HGBA1C 6.4* 01/31/2015      Assessment / Plan:      Stable following coronary artery bypass grafting and placement of atrial clip- holding sinus rhythm currently Chronic anticoagulation on Xarelto for history of atrial fibrillation, also on amiodarone started postoperatively. I've encouraged the patient to enroll in cardiac rehabilitation. Plan to see her back as needed currently followed carefully by cardiology     Grace Isaac MD      [address] [place] [address] Office [phone]   Beeper [phone]  02/14/2015 1:08 PM

## 2015-02-14 NOTE — Telephone Encounter (Signed)
Communicated to caller, understanding verbalized.

## 2015-02-18 ENCOUNTER — Other Ambulatory Visit: Payer: Self-pay | Admitting: *Deleted

## 2015-02-18 ENCOUNTER — Telehealth: Payer: Self-pay | Admitting: Cardiovascular Disease

## 2015-02-18 NOTE — Patient Outreach (Signed)
[place] [facility]) Care Management  02/18/2015  [name] 11/24/1940 [id number]   Assessment: Transition of care follow-up/ care coordination Call placed to reschedule appointment made for 02/14/15 for a home visit. Patient states she is not at home and unable to see her calendar of appointments at this time. Patient asked care management coordinator to call her at home on Wednesday 02/20/15 to see if she is already at home, then can possibly come for a home visit but if not at home on that day, will have to reschedule home visit to another day.  Patient states she is "doing okay"so far. Home health physical therapy has extended another week of service as approved by provider. Patient verbalized possibility of doing cardiac rehabilitation after completion of home health services.  Patient reports taking her medications as ordered and attending follow-up appointments with primary care provider, cardiologist and cardiothoracic surgeon.   Patient has not reported any rapid heartbeats and denies needs or concerns at present. Encouraged to call [facility], care management or 24-hour nurse line if needed.  Plan: Will call patient first on 02/20/15 Wednesday for possible home visit, if not will reschedule for another date and continue with transition of care.  Ellaina Schuler A. Raekwan Spelman, BSN, RN-BC [place] Management Coordinator Cell: [phone]

## 2015-02-18 NOTE — Telephone Encounter (Signed)
Dr, is this ok?

## 2015-02-18 NOTE — Telephone Encounter (Signed)
[name] ( [facility] ) is wanting to know can they extend the Nursing Eval for Home health for one more week , due to [name] has not been available for them to come and meet with her . Please call   Thanks

## 2015-02-18 NOTE — Telephone Encounter (Signed)
Called and advised [name] that it is ok to extend [facility] for another week.

## 2015-02-18 NOTE — Telephone Encounter (Signed)
Please do so

## 2015-02-20 ENCOUNTER — Other Ambulatory Visit: Payer: Self-pay | Admitting: *Deleted

## 2015-02-20 NOTE — Patient Outreach (Signed)
[place] [facility]) Care Management  02/20/2015  KHALONI ENSIGN 1940-11-08 [id number]   Assessment: Care coordination/ Transition of care week 3 Call placed to confirm with patient if it is ok to proceed with home visit today. Contacted patient on her home phone number but automated response states number has been disconnected or no longer in service. Unable to leave a message.  Plan: Will attempt to call on patient's cell phone number.  Marshawn Ninneman A. Breeanna Galgano, BSN, RN-BC [place] Management Coordinator Cell: [phone]

## 2015-02-20 NOTE — Patient Outreach (Signed)
[place] [facility]) Care Management  02/20/2015  [name] March 04, 1940 [id number]   Assessment: Transition of care week 3 (follow-up)/ Care coordination Call placed to confirm with patient if it is ok to proceed with home visit today as what was requested by patient on our last phone conversation. Contacted patient on her mobile phone number but unable to reach her. HIPAA compliant voice message left with name and contact information. Call was also placed earlier to her home phone number but automated response states number had been disconnected or no longer in service.   Plan: Will await for a return call. If unable to receive a call back, will try again later.    Addendum:  Follow-up call placed to patient's mobile phone number to check if it is ok to proceed with a home visit today but still unable to reach patient. HIPAA compliant voice message left with name and contact number.  Plan: Awaiting for patient's return call. If unable to receive a call back, will reschedule for next outreach call.  Madix Blowe A. Mandalyn Pasqua, BSN, RN-BC [place] Management Coordinator Cell: [phone]

## 2015-02-22 ENCOUNTER — Telehealth: Payer: Self-pay | Admitting: *Deleted

## 2015-02-22 NOTE — Telephone Encounter (Signed)
Signed order for Phase II Cardiac Rehab faxed to [facility] 02/20/2015. Signed order for evauation for S/N, PT to [facility] 02/20/2015.

## 2015-02-26 ENCOUNTER — Telehealth: Payer: Self-pay | Admitting: Cardiology

## 2015-02-26 ENCOUNTER — Encounter: Payer: Commercial Managed Care - HMO | Admitting: *Deleted

## 2015-02-26 NOTE — Telephone Encounter (Signed)
LMOVM reminding pt to send remote transmission.   

## 2015-02-27 ENCOUNTER — Other Ambulatory Visit: Payer: Self-pay | Admitting: *Deleted

## 2015-02-27 ENCOUNTER — Encounter: Payer: Self-pay | Admitting: Cardiology

## 2015-02-27 NOTE — Patient Outreach (Signed)
[place] [facility]) Care Management  02/27/2015  [name] [birth year] [id number]   Assessment: Transition of care follow-up call week 4 Unable to receive any return call from patient to messages left on her voicemail last week in an attempt to reschedule a home visit with her. Call placed and spoke with patient briefly. Patient reports she is "fine" and "doing very good". Attempted to reschedule a home visit with her which was not done last week but patient stated, " I don't think I need that". She further states, " I don't have time for all these appointments". Explained and discussed with patient regarding [facility] program/ services and her eligibility to the program.   As per patient, she will have to call and discuss with primary care provider (Dr. Garlon Hatchet) to see if [facility] service is still needed at this point. She agreed to call back care management coordinator and notify what had been discussed with primary provider about proceeding with [facility] services. Patient reports she has home health therapy already lined up for her and possibly cardiac rehabilitation after that. She has no reports of having rapid irregular heartbeats since last hospitalization.  Patient denies any other needs or concerns that she have at the moment. Patient is aware to call [facility], care management or 24-hour nurse line if needed. Contact informations with patient.   Plan: Will await for patient's return call on update regarding discussion with primary care provider about [facility] services. If unable to receive a return call, will schedule for next outreach call to follow-up with patient.  Mahad Newstrom A. Vash Quezada, BSN, RN-BC [place] Management Coordinator Cell: [phone]

## 2015-02-28 ENCOUNTER — Other Ambulatory Visit: Payer: Self-pay | Admitting: *Deleted

## 2015-02-28 NOTE — Patient Outreach (Signed)
[facility]) Care Management  02/28/2015  KANIA HORBAL 07/14/1940 [id number]   Assessment: Case Closure Received a message from [facility] care management assistant that patient had called and asked to discontinue from [facility] service/ program. Called and spoke with patient to confirm and patient states she had talked to her primary care provider about it and would like to notify care management coordinator that she opts to discontinue with the program. According to patient, she "thinks it's an overkill for too many people and too many appointments to deal with". Patient states "it is very overwhelming to me especially that I will be going to cardiac rehab soon, too". According to patient, for right now, she has all that she needs and "no need for anything more". She declined telephonic health coach offer as well. Care management coordinator expressed understanding.  Encouraged patient to call [facility], care management coordinator or 24-hour nurse line as necessary and if further needs arise. Patient agreed and she has the contact informations with her.   Plan: Will close case due to patient opting to discontinue with the program. Will notify primary care provider of case closure.  Yaniah Thiemann A. Otilio Groleau, BSN, RN-BC [facility] Community Care Management Coordinator Cell: [phone]    [employer]  Discrimination is Against the [employer]. and its subsidiaries comply with applicable Federal civil rights laws and do not discriminate on the basis of race, color, national origin, age, disability, or sex. [employer] do not exclude people or treat them differently because of race, color, national origin, age, disability, or sex.    [employer]. and its subsidiaries provide:  . Free auxiliary aids and services, such as qualified sign language interpreters, video remote interpretation, and written information in other formats to people with disabilities when such auxiliary aids and  services are necessary to ensure an equal opportunity to participate. . Free language services to people whose primary language is not English when those services are necessary to provide meaningful access, such as translated documents or oral interpretation.    If you need these services, call [phone] or if you use a TTY, call 711.   If you believe that [employer]. and its subsidiaries have failed to provide these services or discriminated in another way on the basis of race, color, national origin, age, disability, or sex, you can file a Tourist information centre manager with:   [place]rimination [place] [address]   If you need help filing a grievance, call [phone] or if you use a TTY, call 711.  You can also file a civil rights complaint with the U.S. Department of Health and Financial controller, Office for Civil Rights electronically through the Office for Civil Rights Complaint Portal, available at OnSiteLending.nl.jsf, or by mail or phone at:   [place]. Department of Health and Human Services  [place], [place], [facility]  [place], [place]  [phone], [phone] (TDD)  Complaint forms are available at CutFunds.si             [place]: ATTENTION: If you do not speak English, language assistance services, free of charge, are available to you. Call [phone]  (TTY: [id number]).  Espaol (Spanish): ATENCIN: si habla espaol, tiene a su disposicin servicios gratuitos de asistencia lingstica. Llame al [phone] (TTY: [id number]).  ???? (Chinese): ?????????????????????????????? [phone] (TTY:711??  Ti?ng Vi?t (Vietnamese): CH : N?u b?n ni Ti?ng Vi?t, c cc d?ch v? h? tr?  ngn ng? mi?n ph dnh cho b?n. G?i s? [phone] (TTY: [id number]).  ??? ([place]): ?? : ???? ????? ?? , ?? ?? ???? ??? ???? ? ???? .  [phone] (TTY: 711)??? ??? ???? .  Tagalog (Tagalog - Filipino): PAUNAWA: Kung nagsasalita ka ng Tagalog, maaari kang gumamit ng mga serbisyo ng tulong sa wika nang walang bayad. [place] sa [phone] (TTY: [id number]).   [place]): :      ,      .  [phone] (: [id number]).  Kreyl Ayisyen ([place]): ATANSYON: Si w pale Ethelene Hal, gen svis d pou lang ki disponib gratis pou ou. Rele [phone] (TTY: [id number]).  Fonnie Jarvis Marland Kitchen[place]): ATTENTION : Si vous parlez franais, des services d'aide linguistique vous sont proposs gratuitement. Appelez le [phone] (ATS : [id number]).  Polski (Polish): UWAGA: Jeeli mwisz po polsku, moesz skorzysta z bezpatnej pomocy jzykowej. Zadzwo pod numer [phone] (TTY: [id number]).  Portugus ([place]): ATENO: Se fala portugus, encontram-se disponveis servios lingusticos, grtis. Ligue para [phone] (TTY: [id number]).  Italiano ([place]): ATTENZIONE: In caso la lingua parlata sia l'italiano, sono disponibili servizi di assistenza linguistica gratuiti. Chiamare il numero [phone] (TTY: [id number]).  Dawayne Patricia ([place]): ACHTUNG: Wenn Sie Deutsch sprechen, stehen Ihnen kostenlos sprachliche Hilfsdienstleistungen zur [employer]. R[place]num[place]: [phone] (TTY: [id number]).   (Arabic): [phone]   .            : .)[id number] :   (  ??? ([place]): ??????????????????????????????????[phone] ?TTY?711?????????????????  ? (Farsi): [phone]  . ?   ? ?  ? ? ?~ ?  ?    : .??  (TTY: 711) Din Bizaad ([place]): D77 baa ak0 n7n7zin: D77 saad bee y1n7[ti'go Risa Grill, saad bee [id number]'1wo'd66', t'11 Pricilla Loveless n1 h0l=, koj8' h0d77lnih [phone] (TTY: [id number]).

## 2015-03-01 ENCOUNTER — Other Ambulatory Visit: Payer: Self-pay | Admitting: Cardiovascular Disease

## 2015-03-01 ENCOUNTER — Encounter: Payer: Self-pay | Admitting: *Deleted

## 2015-03-01 MED ORDER — RIVAROXABAN 20 MG PO TABS: 20.0000 mg | ORAL_TABLET | Freq: Every day | ORAL | Status: DC

## 2015-03-01 NOTE — Telephone Encounter (Signed)
Spoke with pt, aware no samples available. Small prescription sent to the pharmacy

## 2015-03-01 NOTE — Telephone Encounter (Signed)
Patient calling the office for samples of medication:   1.  What medication and dosage are you requesting samples for? Xarelto and Citalopram 2.  Are you currently out of this medication? 3 days [facility] and not any for Citalopram

## 2015-03-07 ENCOUNTER — Ambulatory Visit (INDEPENDENT_AMBULATORY_CARE_PROVIDER_SITE_OTHER): Payer: Commercial Managed Care - HMO | Admitting: *Deleted

## 2015-03-07 ENCOUNTER — Telehealth: Payer: Self-pay | Admitting: Cardiovascular Disease

## 2015-03-07 DIAGNOSIS — I495 Sick sinus syndrome: Secondary | ICD-10-CM

## 2015-03-07 NOTE — Telephone Encounter (Signed)
New message ° ° ° ° ° °Need help with remote transmission.  Please call °

## 2015-03-07 NOTE — Telephone Encounter (Signed)
Attempted to troubleshoot monitor with patient. Unsuccessful. Error message. Instructed to call [facility] tech services and to call us back if she doesn't have success with them. She is agreeable with plan.

## 2015-03-12 NOTE — Progress Notes (Signed)
Remote pacemaker transmission.   

## 2015-03-13 ENCOUNTER — Ambulatory Visit: Payer: Commercial Managed Care - HMO | Admitting: Cardiovascular Disease

## 2015-03-16 ENCOUNTER — Ambulatory Visit (INDEPENDENT_AMBULATORY_CARE_PROVIDER_SITE_OTHER): Payer: Commercial Managed Care - HMO | Admitting: Family Medicine

## 2015-03-16 VITALS — BP 128/82 | HR 87 | Temp 98.6°F | Resp 18 | Ht 64.5 in | Wt 142.6 lb

## 2015-03-16 DIAGNOSIS — J209 Acute bronchitis, unspecified: Secondary | ICD-10-CM

## 2015-03-16 MED ORDER — HYDROCODONE-HOMATROPINE 5-1.5 MG/5ML PO SYRP: 5.0000 mL | ORAL_SOLUTION | Freq: Three times a day (TID) | ORAL | Status: DC | PRN

## 2015-03-16 MED ORDER — AMOXICILLIN-POT CLAVULANATE 875-125 MG PO TABS: 1.0000 | ORAL_TABLET | Freq: Two times a day (BID) | ORAL | Status: DC

## 2015-03-16 NOTE — Progress Notes (Signed)
By signing my name below, I, [name], attest that this documentation has been prepared under the direction and in the presence of Robyn Haber, [place], Medical Scribe. 03/16/2015.  2:55 PM.  Patient ID: [name] MRN: [id number], DOB: 1940/06/16, 75 y.o. Date of Encounter: 03/16/2015  Primary Physician: [name], MD  Chief Complaint:  Chief Complaint  Patient presents with  . Cough  . Fatigue    HPI:  [name] is a 75 y.o. female who presents to [facility] complaining of cough, gradual onset 2 weeks ago.  Pt reports that she visited [facility] and was prescribed an inhaler and tessalon. She indicates that she found some relief with the inhaler.  Pt states that just recently she started experiencing colored productive cough. She also reports symptoms of night sweats, and fatigue. She denies subjective fever, or pain in the legs. Pt quit smoking about 20 years ago.   Pt had an open heart surgery on 01/04/15, she had 3 blockages of over 90%. She indicates that she has a pacemaker in place. Pt is compliant with taking amiodarone. She is also on a blood thinner, however she will be taken off of it next month.   Past Medical History  Diagnosis Date  . GLUCOSE INTOLERANCE 12/31/2009  . HYPERLIPIDEMIA 12/31/2009  . ANXIETY 12/31/2009  . DEPRESSION 12/31/2009  . MITRAL REGURGITATION 12/31/2009  . HYPERTENSION 12/31/2009  . CORONARY ARTERY DISEASE 12/31/2009  . MITRAL VALVE PROLAPSE 12/31/2009  . MENOPAUSE, EARLY 12/31/2009  . Unspecified urinary incontinence 12/31/2009  . ABDOMINAL PAIN, LOWER 12/31/2009  . DIVERTICULITIS, HX OF 12/31/2009  . Pacemaker 01/21/2012    MDT Adapta dual chamber  . Tachycardia-bradycardia syndrome ([place])     [name] 01/21/2012  . PAD (peripheral artery disease) ([place])   . Heart murmur     "used to say I did" (01/21/2012)  . OSTEOARTHRITIS, HAND 12/31/2009  . [place] DISEASE, CERVICAL  12/31/2009  . [place] DISEASE, LUMBAR 12/31/2009  . Symptomatic sinus bradycardia 01/22/2012  . PAF (paroxysmal atrial fibrillation) ([place]) 01/22/2012  . Sinus node dysfunction ([place]) 01/22/2012  . PVD (peripheral vascular disease), hx stents to bil SFAs 02/2010 01/22/2012  . PAD (peripheral artery disease) ([place])   . Atrial fibrillation with RVR ([place])      Home Meds: Prior to Admission medications   Medication Sig Start Date End Date Taking? Authorizing Provider  amiodarone (PACERONE) 200 MG tablet Take 1 tablet (200 mg total) by mouth daily. \ 02/01/15  Yes Sueanne Margarita, MD  aspirin 81 MG EC tablet Take 81 mg by mouth daily.    Yes Historical Provider, MD  cholecalciferol (VITAMIN D) 1000 UNITS tablet Take 1,000 Units by mouth daily.   Yes Historical Provider, MD  citalopram (CELEXA) 20 MG tablet Take 1 tablet (20 mg total) by mouth daily. 12/05/12  Yes Brett Canales, PA-C  cyanocobalamin 500 MCG tablet Take 1 tablet (500 mcg total) by mouth daily. 08/08/14  Yes Robyn Haber, MD  metoprolol tartrate (LOPRESSOR) 25 MG tablet Take 1 tablet (25 mg total) by mouth 2 (two) times daily. 02/13/15  Yes Rhonda G Barrett, PA-C  rivaroxaban (XARELTO) 20 MG TABS tablet Take 1 tablet (20 mg total) by mouth daily with supper. 03/01/15  Yes Mihai Croitoru, MD  vitamin E 400 UNIT capsule Take 400 Units by mouth daily.   Yes Historical Provider, MD  atorvastatin (LIPITOR) 20 MG tablet Take 1 tablet (20  mg total) by mouth daily at 6 PM. Patient not taking: Reported on 03/16/2015 02/13/15   Evelene Croon Barrett, PA-C  metoprolol succinate (TOPROL-XL) 100 MG 24 hr tablet TAKE 1 TABLET (100 MG TOTAL) BY MOUTH DAILY. KEEP UPCOMING APPOINTMENT Patient not taking: Reported on 03/16/2015 02/14/15   Dani Gobble Croitoru, MD  Omega-3 Fatty Acids (FISH OIL) 1000 MG CAPS Take 1 capsule by mouth daily. Reported on 03/16/2015    Historical Provider, MD    Allergies:  Allergies  Allergen Reactions  . Clonidine Derivatives     Lowers heart  rate  . Plavix [Clopidogrel Bisulfate]     Joint pains  . Exforge [Amlodipine Besylate-Valsartan] Itching and Rash    Social History   Social History  . Marital Status: Widowed    Spouse Name: N/A  . Number of Children: 3  . Years of Education: N/A   Occupational History  . retired Chemical engineer    Social History Main Topics  . Smoking status: Former Smoker -- 0.75 packs/day for 10 years    Types: Cigarettes  . Smokeless tobacco: Never Used     Comment: 01/21/2012 "quit smoking ~ 2002"  . Alcohol Use: 9.0 oz/week    15 Shots of liquor per week     Comment: 01/21/2012 "3 times/wk I have a couple mixed drinks"  . Drug Use: No  . Sexual Activity: Not Currently   Other Topics Concern  . Not on file   Social History Narrative     Review of Systems: Constitutional: negative for chills, fever, weight changes, or fatigue. Positive for night sweats, fatigue.  HEENT: negative for vision changes, hearing loss, congestion, rhinorrhea, ST, epistaxis, or sinus pressure.  Cardiovascular: negative for chest pain or palpitations Respiratory: negative for hemoptysis, wheezing, shortness of breath. Positive for cough Abdominal: negative for abdominal pain, nausea, vomiting, diarrhea, or constipation Dermatological: negative for rash Neurologic: negative for headache, dizziness, or syncope All other systems reviewed and are otherwise negative with the exception to those above and in the HPI.  Physical Exam: Blood pressure 128/82, pulse 87, temperature 98.6 F (37 C), temperature source Oral, resp. rate 18, height 5' 4.5" (1.638 m), weight 142 lb 9.6 oz (64.683 kg), SpO2 98 %., Body mass index is 24.11 kg/(m^2). General: Well developed, well nourished, in no acute distress. Head: Normocephalic, atraumatic, eyes without discharge, sclera non-icteric, nares are without discharge. Bilateral auditory canals clear, TM's are without perforation, pearly grey and translucent with reflective  cone of light bilaterally. Oral cavity moist, posterior pharynx without exudate, erythema, peritonsillar abscess, or post nasal drip.  Neck: Supple. No thyromegaly. Full ROM. No lymphadenopathy. Lungs: No wheezes, or rhonchi. Breathing is unlabored. Rales on the left base.  Heart: RRR with S1 S2. No murmurs, rubs, or gallops appreciated. Msk:  Strength and tone normal for age. Extremities/Skin: Warm and dry. No clubbing or cyanosis. No edema. No rashes or suspicious lesions. Midline sternal scar, and midline cervical scar, both of which are well healed.  Neuro: Alert and oriented X 3. Moves all extremities spontaneously. Gait is normal. CNII-XII grossly in tact. Psych:  Responds to questions appropriately with a normal affect.   Labs:  ASSESSMENT AND PLAN:  75 y.o. year old female with   This chart was scribed in my presence and reviewed by me personally.    ICD-9-CM ICD-10-CM   1. Acute bronchitis, unspecified organism 466.0 J20.9 amoxicillin-clavulanate (AUGMENTIN) 875-125 MG tablet     HYDROcodone-homatropine (HYCODAN) 5-1.5 MG/5ML syrup  Signed, Robyn Haber, MD  Signed, Robyn Haber, MD 03/16/2015 2:47 PM

## 2015-03-16 NOTE — Patient Instructions (Signed)

## 2015-03-20 ENCOUNTER — Telehealth: Payer: Self-pay | Admitting: Cardiovascular Disease

## 2015-03-20 LAB — CUP PACEART REMOTE DEVICE CHECK
Battery Impedance: 230 Ohm
Battery Remaining Longevity: 100 mo
Battery Voltage: 2.78 V
Brady Statistic AP VP Percent: 0 %
Brady Statistic AP VS Percent: 100 %
Brady Statistic AS VP Percent: 0 %
Brady Statistic AS VS Percent: 0 %
Date Time Interrogation Session: 20170126210129
Implantable Lead Implant Date: 20131212
Implantable Lead Implant Date: 20131212
Implantable Lead Location: 753859
Implantable Lead Location: 753860
Implantable Lead Model: 5076
Implantable Lead Model: 5076
Lead Channel Impedance Value: 354 Ohm
Lead Channel Impedance Value: 426 Ohm
Lead Channel Pacing Threshold Amplitude: 0.875 V
Lead Channel Pacing Threshold Amplitude: 1.125 V
Lead Channel Pacing Threshold Pulse Width: 0.4 ms
Lead Channel Pacing Threshold Pulse Width: 0.4 ms
Lead Channel Sensing Intrinsic Amplitude: 11.2 mV
Lead Channel Setting Pacing Amplitude: 2.25 V
Lead Channel Setting Pacing Amplitude: 2.5 V
Lead Channel Setting Pacing Pulse Width: 0.4 ms
Lead Channel Setting Sensing Sensitivity: 5.6 mV

## 2015-03-20 NOTE — Telephone Encounter (Signed)
New message ° ° ° ° ° °Patient calling the office for samples of medication: ° ° °1.  What medication and dosage are you requesting samples for? xarelto 20mg °2.  Are you currently out of this medication? Almost out ° ° °

## 2015-03-22 ENCOUNTER — Encounter: Payer: Self-pay | Admitting: Cardiology

## 2015-03-22 NOTE — Telephone Encounter (Signed)
Patient calling the office for samples of medication:   1.  What medication and dosage are you requesting samples for? Xarelto 20 mg  2.  Are you currently out of this medication? Yes  :  

## 2015-03-22 NOTE — Telephone Encounter (Signed)
Medication Samples have been provided to the patient.  Drug name: Xarelto 20 mg  Qty: 7  LOT: [id number]  Exp.Date: 09/2017  The patient has been instructed regarding the correct time, dose, and frequency of taking this medication, including desired effects and most common side effects.   Called patient and let her know that she can pick up at the front desk  Kathy Breach 12:56 PM 03/22/2015

## 2015-04-01 ENCOUNTER — Other Ambulatory Visit: Payer: Self-pay | Admitting: *Deleted

## 2015-04-01 DIAGNOSIS — R0789 Other chest pain: Secondary | ICD-10-CM

## 2015-04-02 ENCOUNTER — Ambulatory Visit
Admission: RE | Admit: 2015-04-02 | Discharge: 2015-04-02 | Disposition: A | Discharge status: Home / Self Care | Payer: Commercial Managed Care - HMO | Source: Ambulatory Visit | Attending: Cardiothoracic Surgery | Admitting: Cardiothoracic Surgery

## 2015-04-02 DIAGNOSIS — R0789 Other chest pain: Secondary | ICD-10-CM

## 2015-04-04 ENCOUNTER — Ambulatory Visit (HOSPITAL_COMMUNITY): Payer: Commercial Managed Care - HMO

## 2015-04-05 ENCOUNTER — Encounter: Payer: Self-pay | Admitting: Cardiology

## 2015-04-08 ENCOUNTER — Ambulatory Visit (HOSPITAL_COMMUNITY): Payer: Commercial Managed Care - HMO

## 2015-04-10 ENCOUNTER — Ambulatory Visit (HOSPITAL_COMMUNITY): Payer: Commercial Managed Care - HMO

## 2015-04-15 ENCOUNTER — Ambulatory Visit (HOSPITAL_COMMUNITY): Payer: Commercial Managed Care - HMO

## 2015-04-17 ENCOUNTER — Ambulatory Visit (HOSPITAL_COMMUNITY): Payer: Commercial Managed Care - HMO

## 2015-04-22 ENCOUNTER — Ambulatory Visit (HOSPITAL_COMMUNITY): Payer: Commercial Managed Care - HMO

## 2015-04-23 ENCOUNTER — Encounter (HOSPITAL_COMMUNITY)
Admission: RE | Admit: 2015-04-23 | Discharge: 2015-04-23 | Disposition: A | Discharge status: Home / Self Care | Payer: Commercial Managed Care - HMO | Source: Ambulatory Visit | Attending: Cardiovascular Disease | Admitting: Cardiovascular Disease

## 2015-04-23 ENCOUNTER — Encounter (HOSPITAL_COMMUNITY): Payer: Self-pay

## 2015-04-23 VITALS — Ht 65.0 in | Wt 141.5 lb

## 2015-04-23 DIAGNOSIS — Z951 Presence of aortocoronary bypass graft: Secondary | ICD-10-CM | POA: Insufficient documentation

## 2015-04-23 NOTE — Progress Notes (Signed)
Cardiac Rehab Medication Review by a Pharmacist  Does the patient  feel that his/her medications are working for him/her?  yes  Has the patient been experiencing any side effects to the medications prescribed?  no  Does the patient measure his/her own blood pressure or blood glucose at home?  no   Does the patient have any problems obtaining medications due to transportation or finances?   Yes, Xarelto cost too much and patient only takes when she can get samples from the MD office  Understanding of regimen: excellent Understanding of indications: good Potential of compliance: excellent   Pharmacist comments: Pt overall has no complaints and has a good understanding of what each medication is for. She had no additional questions.   Darl Pikes, PharmD Clinical Pharmacist- Resident Pager: [phone]  Darl Pikes 04/23/2015 2:06 PM

## 2015-04-23 NOTE — Progress Notes (Signed)
Pt arrived at cardiac rehab today for orientation. Pt unable to participate in 6 minute walk test due to wearing flipflops.  Pt will plan to wear exercise appropriate shoes at next scheduled visit so exercise can be performed. Pt verbalized Understanding.

## 2015-04-24 ENCOUNTER — Ambulatory Visit (HOSPITAL_COMMUNITY): Payer: Commercial Managed Care - HMO

## 2015-04-24 NOTE — Progress Notes (Signed)
Cardiac Individual Treatment Plan  Patient Details  Name: [name] MRN: [id number] Date of Birth: December 08, 1940 Referring Provider:  Sanda Klein, MD  Initial Encounter Date:   Visit Diagnosis: S/P CABG (coronary artery bypass graft)  Patient's Home Medications on Admission:  Current outpatient prescriptions:  .  amiodarone (PACERONE) 200 MG tablet, Take 1 tablet (200 mg total) by mouth daily. \, Disp: 30 tablet, Rfl: 3 .  aspirin 81 MG EC tablet, Take 81 mg by mouth daily. , Disp: , Rfl:  .  atorvastatin (LIPITOR) 20 MG tablet, Take 1 tablet (20 mg total) by mouth daily at 6 PM., Disp: 90 tablet, Rfl: 1 .  cholecalciferol (VITAMIN D) 1000 UNITS tablet, Take 1,000 Units by mouth daily., Disp: , Rfl:  .  citalopram (CELEXA) 20 MG tablet, Take 1 tablet (20 mg total) by mouth daily., Disp: 90 tablet, Rfl: 0 .  cyanocobalamin 500 MCG tablet, Take 1 tablet (500 mcg total) by mouth daily. (Patient taking differently: Take 1,000 mcg by mouth daily. ), Disp: 90 tablet, Rfl: 3 .  metoprolol tartrate (LOPRESSOR) 25 MG tablet, Take 1 tablet (25 mg total) by mouth 2 (two) times daily., Disp: 180 tablet, Rfl: 1 .  rivaroxaban (XARELTO) 20 MG TABS tablet, Take 1 tablet (20 mg total) by mouth daily with supper., Disp: 15 tablet, Rfl: 3 .  vitamin E 400 UNIT capsule, Take 400 Units by mouth daily., Disp: , Rfl:  .  Omega-3 Fatty Acids (FISH OIL) 1000 MG CAPS, Take 1 capsule by mouth daily. Reported on 04/23/2015, Disp: , Rfl:   Past Medical History: Past Medical History  Diagnosis Date  . GLUCOSE INTOLERANCE 12/31/2009  . HYPERLIPIDEMIA 12/31/2009  . ANXIETY 12/31/2009  . DEPRESSION 12/31/2009  . MITRAL REGURGITATION 12/31/2009  . HYPERTENSION 12/31/2009  . CORONARY ARTERY DISEASE 12/31/2009  . MITRAL VALVE PROLAPSE 12/31/2009  . MENOPAUSE, EARLY 12/31/2009  . Unspecified urinary incontinence 12/31/2009  . ABDOMINAL PAIN, LOWER 12/31/2009  . DIVERTICULITIS, HX OF 12/31/2009  . Pacemaker  01/21/2012    MDT Adapta dual chamber  . Tachycardia-bradycardia syndrome ([place])     [name] 01/21/2012  . PAD (peripheral artery disease) ([place])   . Heart murmur     "used to say I did" (01/21/2012)  . OSTEOARTHRITIS, HAND 12/31/2009  . [place] DISEASE, CERVICAL 12/31/2009  . [place] DISEASE, LUMBAR 12/31/2009  . Symptomatic sinus bradycardia 01/22/2012  . PAF (paroxysmal atrial fibrillation) ([place]) 01/22/2012  . Sinus node dysfunction ([place]) 01/22/2012  . PVD (peripheral vascular disease), hx stents to bil SFAs 02/2010 01/22/2012  . PAD (peripheral artery disease) ([place])   . Atrial fibrillation with RVR ([facility])     Tobacco Use: History  Smoking status  . Former Smoker -- 0.75 packs/day for 10 years  . Types: Cigarettes  Smokeless tobacco  . Never Used    Comment: 01/21/2012 "quit smoking ~ 2002"    Labs: Recent Review Flowsheet Data    Labs for ITP Cardiac and Pulmonary Rehab Latest Ref Rng 01/04/2015 01/04/2015 01/05/2015 01/31/2015 02/01/2015   Cholestrol 0 - 200 mg/dL - - - - 124   LDLCALC 0 - 99 mg/dL - - - - 68   HDL >40 mg/dL - - - - 34(L)   Trlycerides <150 mg/dL - - - - 110   Hemoglobin A1c 4.8 - 5.6 % - - - 6.4(H) -   PHART 7.350 - 7.450 7.587(H) 7.344(L) - - -   PCO2ART 35.0 - 45.0 mmHg 23.4(L) 41.7 - - -  HCO3 20.0 - 24.0 mEq/L 22.3 22.6 - - -   TCO2 0 - 100 mmol/L 23 24 28  - -   ACIDBASEDEF 0.0 - 2.0 mmol/L - 3.0(H) - - -   O2SAT - 98.0 96.0 - - -      Capillary Blood Glucose: Lab Results  Component Value Date   GLUCAP 119* 01/12/2015   GLUCAP 107* 01/11/2015   GLUCAP 102* 01/08/2015   GLUCAP 116* 01/08/2015   GLUCAP 106* 01/07/2015     Exercise Target Goals:    Exercise Program Goal: Individual exercise prescription set with THRR, safety & activity barriers. Participant demonstrates ability to understand and report RPE using BORG scale, to self-measure pulse accurately, and to acknowledge the importance of the exercise prescription.  Exercise  Prescription Goal: Starting with aerobic activity 30 plus minutes a day, 3 days per week for initial exercise prescription. Provide home exercise prescription and guidelines that participant acknowledges understanding prior to discharge.  Activity Barriers & Risk Stratification:     Activity Barriers & Cardiac Risk Stratification - 04/23/15 1617    Activity Barriers & Cardiac Risk Stratification   Activity Barriers None   Cardiac Risk Stratification High      6 Minute Walk:   Initial Exercise Prescription:   Perform Capillary Blood Glucose checks as needed.  Exercise Prescription Changes:   Discharge Exercise Prescription (Final Exercise Prescription Changes):   Nutrition:  Target Goals: Understanding of nutrition guidelines, daily intake of sodium 1500mg , cholesterol 200mg , calories 30% from fat and 7% or less from saturated fats, daily to have 5 or more servings of fruits and vegetables.  Biometrics:    Nutrition Therapy Plan and Nutrition Goals:     Nutrition Therapy & Goals - 04/24/15 0935    Nutrition Therapy   Diet Therapeutic Lifestyle Changes   Personal Nutrition Goals   Personal Goal #1 6-10 lb wt loss from current wt of 142.6 lb at graduation from [place] Goal #2 Establish healthy eating patterns, emphasizing a variety of nutrient-dense foods in appropriate portion sizes, to improve overall health and specifically to attain blood glucose control through diet.   Intervention Plan   Intervention Prescribe, educate and counsel regarding individualized specific dietary modifications aiming towards targeted core components such as weight, hypertension, lipid management, diabetes, heart failure and other comorbidities.   Expected Outcomes Short Term Goal: Understand basic principles of dietary content, such as calories, fat, sodium, cholesterol and nutrients.;Long Term Goal: Adherence to prescribed nutrition plan.      Nutrition Discharge: Rate  Your Plate Scores:   Nutrition Goals Re-Evaluation:   Psychosocial: Target Goals: Acknowledge presence or absence of depression, maximize coping skills, provide positive support system. Participant is able to verbalize types and ability to use techniques and skills needed for reducing stress and depression.  Initial Review & Psychosocial Screening:   Quality of Life Scores:   PHQ-9:     Recent Review Flowsheet Data    Depression screen [facility] 2/9 03/16/2015 01/23/2015 08/06/2014   Decreased Interest 0 0 3   Down, Depressed, Hopeless 0 0 0   PHQ - 2 Score 0 0 3   Altered sleeping - - 1   Tired, decreased energy - - 3   Change in appetite - - 0   Feeling bad or failure about yourself  - - 0   Trouble concentrating - - 0   Moving slowly or fidgety/restless - - 3   Suicidal thoughts - - 0   PHQ-9  Score - - 10   Difficult doing work/chores - - Not difficult at all      Psychosocial Evaluation and Intervention:   Psychosocial Re-Evaluation:   Vocational Rehabilitation: Provide vocational rehab assistance to qualifying candidates.   Vocational Rehab Evaluation & Intervention:   Education: Education Goals: Education classes will be provided on a weekly basis, covering required topics. Participant will state understanding/return demonstration of topics presented.  Learning Barriers/Preferences:     Learning Barriers/Preferences - 04/23/15 1617    Learning Barriers/Preferences   Learning Barriers None   Learning Preferences Written Material      Education Topics: Count Your Pulse:  -Group instruction provided by verbal instruction, demonstration, patient participation and written materials to support subject.  Instructors address importance of being able to find your pulse and how to count your pulse when at home without a heart monitor.  Patients get hands on experience counting their pulse with staff help and individually.   Heart Attack, Angina, and Risk Factor  Modification:  -Group instruction provided by verbal instruction, video, and written materials to support subject.  Instructors address signs and symptoms of angina and heart attacks.    Also discuss risk factors for heart disease and how to make changes to improve heart health risk factors.   Functional Fitness:  -Group instruction provided by verbal instruction, demonstration, patient participation, and written materials to support subject.  Instructors address safety measures for doing things around the house.  Discuss how to get up and down off the floor, how to pick things up properly, how to safely get out of a chair without assistance, and balance training.   Meditation and Mindfulness:  -Group instruction provided by verbal instruction, patient participation, and written materials to support subject.  Instructor addresses importance of mindfulness and meditation practice to help reduce stress and improve awareness.  Instructor also leads participants through a meditation exercise.    Stretching for Flexibility and Mobility:  -Group instruction provided by verbal instruction, patient participation, and written materials to support subject.  Instructors lead participants through series of stretches that are designed to increase flexibility thus improving mobility.  These stretches are additional exercise for major muscle groups that are typically performed during regular warm up and cool down.   Hands Only CPR Anytime:  -Group instruction provided by verbal instruction, video, patient participation and written materials to support subject.  Instructors co-teach with AHA video for hands only CPR.  Participants get hands on experience with mannequins.   Nutrition I class: Heart Healthy Eating:  -Group instruction provided by PowerPoint slides, verbal discussion, and written materials to support subject matter. The instructor gives an explanation and review of the Therapeutic Lifestyle  Changes diet recommendations, which includes a discussion on lipid goals, dietary fat, sodium, fiber, plant stanol/sterol esters, sugar, and the components of a well-balanced, healthy diet.   Nutrition II class: Lifestyle Skills:  -Group instruction provided by PowerPoint slides, verbal discussion, and written materials to support subject matter. The instructor gives an explanation and review of label reading, grocery shopping for heart health, heart healthy recipe modifications, and ways to make healthier choices when eating out.   Diabetes Question & Answer:  -Group instruction provided by PowerPoint slides, verbal discussion, and written materials to support subject matter. The instructor gives an explanation and review of diabetes co-morbidities, pre- and post-prandial blood glucose goals, pre-exercise blood glucose goals, signs, symptoms, and treatment of hypoglycemia and hyperglycemia, and foot care basics.   Diabetes Blitz:  -Group instruction  provided by PowerPoint slides, verbal discussion, and written materials to support subject matter. The instructor gives an explanation and review of the physiology behind type 1 and type 2 diabetes, diabetes medications and rational behind using different medications, pre- and post-prandial blood glucose recommendations and Hemoglobin A1c goals, diabetes diet, and exercise including blood glucose guidelines for exercising safely.    Portion Distortion:  -Group instruction provided by PowerPoint slides, verbal discussion, written materials, and food models to support subject matter. The instructor gives an explanation of serving size versus portion size, changes in portions sizes over the last 20 years, and what consists of a serving from each food group.   Stress Management:  -Group instruction provided by verbal instruction, video, and written materials to support subject matter.  Instructors review role of stress in heart disease and how to cope  with stress positively.     Exercising on Your Own:  -Group instruction provided by verbal instruction, power point, and written materials to support subject.  Instructors discuss benefits of exercise, components of exercise, frequency and intensity of exercise, and end points for exercise.  Also discuss use of nitroglycerin and activating EMS.  Review options of places to exercise outside of rehab.  Review guidelines for sex with heart disease.   Cardiac Drugs I:  -Group instruction provided by verbal instruction and written materials to support subject.  Instructor reviews cardiac drug classes: antiplatelets, anticoagulants, beta blockers, and statins.  Instructor discusses reasons, side effects, and lifestyle considerations for each drug class.   Cardiac Drugs II:  -Group instruction provided by verbal instruction and written materials to support subject.  Instructor reviews cardiac drug classes: angiotensin converting enzyme inhibitors (ACE-I), angiotensin II receptor blockers (ARBs), nitrates, and calcium channel blockers.  Instructor discusses reasons, side effects, and lifestyle considerations for each drug class.   Anatomy and Physiology of the Circulatory System:  -Group instruction provided by verbal instruction, video, and written materials to support subject.  Reviews functional anatomy of heart, how it relates to various diagnoses, and what role the heart plays in the overall system.   Knowledge Questionnaire Score:   Personal Goals and Risk Factors at Admission:     Personal Goals and Risk Factors at Admission - 04/23/15 1458    Core Components/Risk Factors/Patient Goals on Admission   Sedentary Yes  since surgery   Intervention Provide advice, education, support and counseling about physical activity/exercise needs.;Develop an individualized exercise prescription for aerobic and resistive training based on initial evaluation findings, risk stratification, comorbidities and  participant's personal goals.   Expected Outcomes Achievement of increased cardiorespiratory fitness and enhanced flexibility, muscular endurance and strength shown through measurements of functional capaciy and personal statement of participant.   Personal Goal Other Yes   Personal Goal Increase leg strength   Intervention Design exercise prescription to increase leg strength   Expected Outcomes Able to improve walk test and leg strength      Personal Goals and Risk Factors Review:    Personal Goals Discharge (Final Personal Goals and Risk Factors Review):    ITP Comments:     ITP Comments      04/24/15 1623           ITP Comments Dr. Fransico Him, Medical Director          Comments:

## 2015-04-24 NOTE — Progress Notes (Deleted)
Cardiac Individual Treatment Plan  Patient Details  Name: [name] MRN: [id number] Date of Birth: 03-28-1940 Referring Provider:  Sanda Klein, MD  Initial Encounter Date:   Visit Diagnosis: S/P CABG (coronary artery bypass graft)  Patient's Home Medications on Admission:  Current outpatient prescriptions:  .  amiodarone (PACERONE) 200 MG tablet, Take 1 tablet (200 mg total) by mouth daily. \, Disp: 30 tablet, Rfl: 3 .  aspirin 81 MG EC tablet, Take 81 mg by mouth daily. , Disp: , Rfl:  .  atorvastatin (LIPITOR) 20 MG tablet, Take 1 tablet (20 mg total) by mouth daily at 6 PM., Disp: 90 tablet, Rfl: 1 .  cholecalciferol (VITAMIN D) 1000 UNITS tablet, Take 1,000 Units by mouth daily., Disp: , Rfl:  .  citalopram (CELEXA) 20 MG tablet, Take 1 tablet (20 mg total) by mouth daily., Disp: 90 tablet, Rfl: 0 .  cyanocobalamin 500 MCG tablet, Take 1 tablet (500 mcg total) by mouth daily. (Patient taking differently: Take 1,000 mcg by mouth daily. ), Disp: 90 tablet, Rfl: 3 .  metoprolol tartrate (LOPRESSOR) 25 MG tablet, Take 1 tablet (25 mg total) by mouth 2 (two) times daily., Disp: 180 tablet, Rfl: 1 .  rivaroxaban (XARELTO) 20 MG TABS tablet, Take 1 tablet (20 mg total) by mouth daily with supper., Disp: 15 tablet, Rfl: 3 .  vitamin E 400 UNIT capsule, Take 400 Units by mouth daily., Disp: , Rfl:  .  Omega-3 Fatty Acids (FISH OIL) 1000 MG CAPS, Take 1 capsule by mouth daily. Reported on 04/23/2015, Disp: , Rfl:   Past Medical History: Past Medical History  Diagnosis Date  . GLUCOSE INTOLERANCE 12/31/2009  . HYPERLIPIDEMIA 12/31/2009  . ANXIETY 12/31/2009  . DEPRESSION 12/31/2009  . MITRAL REGURGITATION 12/31/2009  . HYPERTENSION 12/31/2009  . CORONARY ARTERY DISEASE 12/31/2009  . MITRAL VALVE PROLAPSE 12/31/2009  . MENOPAUSE, EARLY 12/31/2009  . Unspecified urinary incontinence 12/31/2009  . ABDOMINAL PAIN, LOWER 12/31/2009  . DIVERTICULITIS, HX OF 12/31/2009  . Pacemaker  01/21/2012    MDT Adapta dual chamber  . Tachycardia-bradycardia syndrome ([place])     [name] 01/21/2012  . PAD (peripheral artery disease) ([place])   . Heart murmur     "used to say I did" (01/21/2012)  . OSTEOARTHRITIS, HAND 12/31/2009  . [place] DISEASE, CERVICAL 12/31/2009  . [place] DISEASE, LUMBAR 12/31/2009  . Symptomatic sinus bradycardia 01/22/2012  . PAF (paroxysmal atrial fibrillation) ([place]) 01/22/2012  . Sinus node dysfunction ([place]) 01/22/2012  . PVD (peripheral vascular disease), hx stents to bil SFAs 02/2010 01/22/2012  . PAD (peripheral artery disease) ([place])   . Atrial fibrillation with RVR ([facility])     Tobacco Use: History  Smoking status  . Former Smoker -- 0.75 packs/day for 10 years  . Types: Cigarettes  Smokeless tobacco  . Never Used    Comment: 01/21/2012 "quit smoking ~ 2002"    Labs:     Recent Review Flowsheet Data    Labs for ITP Cardiac and Pulmonary Rehab Latest Ref Rng 01/04/2015 01/04/2015 01/05/2015 01/31/2015 02/01/2015   Cholestrol 0 - 200 mg/dL - - - - 124   LDLCALC 0 - 99 mg/dL - - - - 68   HDL >40 mg/dL - - - - 34(L)   Trlycerides <150 mg/dL - - - - 110   Hemoglobin A1c 4.8 - 5.6 % - - - 6.4(H) -   PHART 7.350 - 7.450 7.587(H) 7.344(L) - - -   PCO2ART 35.0 - 45.0 mmHg 23.4(L) 41.7 - - -  HCO3 20.0 - 24.0 mEq/L 22.3 22.6 - - -   TCO2 0 - 100 mmol/L 23 24 28  - -   ACIDBASEDEF 0.0 - 2.0 mmol/L - 3.0(H) - - -   O2SAT - 98.0 96.0 - - -      Capillary Blood Glucose: Lab Results  Component Value Date   GLUCAP 119* 01/12/2015   GLUCAP 107* 01/11/2015   GLUCAP 102* 01/08/2015   GLUCAP 116* 01/08/2015   GLUCAP 106* 01/07/2015     Exercise Target Goals:    Exercise Program Goal: Individual exercise prescription set with THRR, safety & activity barriers. Participant demonstrates ability to understand and report RPE using BORG scale, to self-measure pulse accurately, and to acknowledge the importance of the exercise prescription.  Exercise  Prescription Goal: Starting with aerobic activity 30 plus minutes a day, 3 days per week for initial exercise prescription. Provide home exercise prescription and guidelines that participant acknowledges understanding prior to discharge.  Activity Barriers & Risk Stratification:     Activity Barriers & Cardiac Risk Stratification - 04/23/15 1617    Activity Barriers & Cardiac Risk Stratification   Activity Barriers None   Cardiac Risk Stratification High      6 Minute Walk:   Initial Exercise Prescription:   Perform Capillary Blood Glucose checks as needed.  Exercise Prescription Changes:   Discharge Exercise Prescription (Final Exercise Prescription Changes):   Nutrition:  Target Goals: Understanding of nutrition guidelines, daily intake of sodium 1500mg , cholesterol 200mg , calories 30% from fat and 7% or less from saturated fats, daily to have 5 or more servings of fruits and vegetables.  Biometrics:    Nutrition Therapy Plan and Nutrition Goals:     Nutrition Therapy & Goals - 04/24/15 0935    Nutrition Therapy   Diet Therapeutic Lifestyle Changes   Personal Nutrition Goals   Personal Goal #1 6-10 lb wt loss from current wt of 142.6 lb at graduation from [place] Goal #2 Establish healthy eating patterns, emphasizing a variety of nutrient-dense foods in appropriate portion sizes, to improve overall health and specifically to attain blood glucose control through diet.   Intervention Plan   Intervention Prescribe, educate and counsel regarding individualized specific dietary modifications aiming towards targeted core components such as weight, hypertension, lipid management, diabetes, heart failure and other comorbidities.   Expected Outcomes Short Term Goal: Understand basic principles of dietary content, such as calories, fat, sodium, cholesterol and nutrients.;Long Term Goal: Adherence to prescribed nutrition plan.      Nutrition Discharge: Rate  Your Plate Scores:   Nutrition Goals Re-Evaluation:   Psychosocial: Target Goals: Acknowledge presence or absence of depression, maximize coping skills, provide positive support system. Participant is able to verbalize types and ability to use techniques and skills needed for reducing stress and depression.  Initial Review & Psychosocial Screening:   Quality of Life Scores:   PHQ-9:     Recent Review Flowsheet Data    Depression screen [facility] 2/9 03/16/2015 01/23/2015 08/06/2014   Decreased Interest 0 0 3   Down, Depressed, Hopeless 0 0 0   PHQ - 2 Score 0 0 3   Altered sleeping - - 1   Tired, decreased energy - - 3   Change in appetite - - 0   Feeling bad or failure about yourself  - - 0   Trouble concentrating - - 0   Moving slowly or fidgety/restless - - 3   Suicidal thoughts - - 0   PHQ-9  Score - - 10   Difficult doing work/chores - - Not difficult at all      Psychosocial Evaluation and Intervention:   Psychosocial Re-Evaluation:   Vocational Rehabilitation: Provide vocational rehab assistance to qualifying candidates.   Vocational Rehab Evaluation & Intervention:   Education: Education Goals: Education classes will be provided on a weekly basis, covering required topics. Participant will state understanding/return demonstration of topics presented.  Learning Barriers/Preferences:     Learning Barriers/Preferences - 04/23/15 1617    Learning Barriers/Preferences   Learning Barriers None   Learning Preferences Written Material      Education Topics: Hypertension, Hypertension Reduction -Define heart disease and high blood pressure. Discus how high blood pressure affects the body and ways to reduce high blood pressure.   Exercise and Your Heart -Discuss why it is important to exercise, the FITT principles of exercise, normal and abnormal responses to exercise, and how to exercise safely.   Angina -Discuss definition of angina, causes of angina,  treatment of angina, and how to decrease risk of having angina.   Cardiac Medications -Review what the following cardiac medications are used for, how they affect the body, and side effects that may occur when taking the medications.  Medications include Aspirin, Beta blockers, calcium channel blockers, ACE Inhibitors, angiotensin receptor blockers, diuretics, digoxin, and antihyperlipidemics.   Congestive Heart Failure -Discuss the definition of CHF, how to live with CHF, the signs and symptoms of CHF, and how keep track of weight and sodium intake.   Heart Disease and Intimacy -Discus the effect sexual activity has on the heart, how changes occur during intimacy as we age, and safety during sexual activity.   Smoking Cessation / COPD -Discuss different methods to quit smoking, the health benefits of quitting smoking, and the definition of COPD.   Nutrition I: Fats -Discuss the types of cholesterol, what cholesterol does to the heart, and how cholesterol levels can be controlled.   Nutrition II: Labels -Discuss the different components of food labels and how to read food label   Heart Parts and Heart Disease -Discuss the anatomy of the heart, the pathway of blood circulation through the heart, and these are affected by heart disease.   Stress I: Signs and Symptoms -Discuss the causes of stress, how stress may lead to anxiety and depression, and ways to limit stress.   Stress II: Relaxation -Discuss different types of relaxation techniques to limit stress.   Warning Signs of Stroke / TIA -Discuss definition of a stroke, what the signs and symptoms are of a stroke, and how to identify when someone is having stroke.   Knowledge Questionnaire Score:   Personal Goals and Risk Factors at Admission:     Personal Goals and Risk Factors at Admission - 04/23/15 1458    Core Components/Risk Factors/Patient Goals on Admission   Sedentary Yes  since surgery   Intervention  Provide advice, education, support and counseling about physical activity/exercise needs.;Develop an individualized exercise prescription for aerobic and resistive training based on initial evaluation findings, risk stratification, comorbidities and participant's personal goals.   Expected Outcomes Achievement of increased cardiorespiratory fitness and enhanced flexibility, muscular endurance and strength shown through measurements of functional capaciy and personal statement of participant.   Personal Goal Other Yes   Personal Goal Increase leg strength   Intervention Design exercise prescription to increase leg strength   Expected Outcomes Able to improve walk test and leg strength      Personal Goals and Risk Factors Review:  Personal Goals Discharge (Final Personal Goals and Risk Factors Review):    ITP Comments:  Dr. Radford Pax Medical Director       ITP Comments      04/24/15 1623           ITP Comments Dr. Fransico Him, Medical Director          Comments: Patient attended orientation from 1330 to 1530 to review rules and guidelines for program. pt unable to complete walk test due inappropriate footwear.

## 2015-04-29 ENCOUNTER — Telehealth (HOSPITAL_COMMUNITY): Payer: Self-pay | Admitting: *Deleted

## 2015-04-29 ENCOUNTER — Encounter (HOSPITAL_COMMUNITY): Admission: RE | Admit: 2015-04-29 | Payer: Commercial Managed Care - HMO | Source: Ambulatory Visit

## 2015-04-29 ENCOUNTER — Ambulatory Visit (HOSPITAL_COMMUNITY): Payer: Commercial Managed Care - HMO

## 2015-04-29 ENCOUNTER — Telehealth (HOSPITAL_COMMUNITY): Payer: Self-pay | Admitting: Family Medicine

## 2015-04-29 ENCOUNTER — Ambulatory Visit: Payer: Commercial Managed Care - HMO | Admitting: Cardiovascular Disease

## 2015-04-29 NOTE — Progress Notes (Signed)
Pt attended cardiac rehab orientation, however unable to complete 6 minute walk test due to inappropriate footwear. Will plan to administer walk test at next scheduled cardiac rehab visit.

## 2015-05-01 ENCOUNTER — Ambulatory Visit (HOSPITAL_COMMUNITY): Payer: Commercial Managed Care - HMO

## 2015-05-01 ENCOUNTER — Encounter (HOSPITAL_COMMUNITY): Payer: Commercial Managed Care - HMO

## 2015-05-03 ENCOUNTER — Telehealth: Payer: Self-pay | Admitting: Cardiovascular Disease

## 2015-05-03 ENCOUNTER — Encounter (HOSPITAL_COMMUNITY)
Admission: RE | Admit: 2015-05-03 | Discharge: 2015-05-03 | Disposition: A | Discharge status: Home / Self Care | Payer: Commercial Managed Care - HMO | Source: Ambulatory Visit | Attending: Cardiovascular Disease | Admitting: Cardiovascular Disease

## 2015-05-03 DIAGNOSIS — Z951 Presence of aortocoronary bypass graft: Secondary | ICD-10-CM | POA: Diagnosis present

## 2015-05-03 NOTE — Telephone Encounter (Signed)
Received a call from [facility] at [place] regarding patient and her Xarelto Patient at rehab for walk test She d/c her Xarelto secondary to cost and increased bruising/skin bleeding Patient does not think she has had any episodes of Afib Discussed with Dr Sallyanne Kuster and ok to do walk test even though patient d/c her Xarelto, keep follow up appointment 4/5 as scheduled  Advised Verdis Frederickson

## 2015-05-03 NOTE — Progress Notes (Signed)
Cardiac Individual Treatment Plan  Patient Details  Name: [name] MRN: [id number] Date of Birth: 05-25-1940 Referring Provider:  Kathyrn Lass, MD  Initial Encounter Date:       CARDIAC REHAB PHASE II EXERCISE from 05/03/2015 in [facility]   Date  05/03/15      Visit Diagnosis: S/P CABG (coronary artery bypass graft)  Patient's Home Medications on Admission:  Current outpatient prescriptions:  .  amiodarone (PACERONE) 200 MG tablet, Take 1 tablet (200 mg total) by mouth daily. \, Disp: 30 tablet, Rfl: 3 .  aspirin 81 MG EC tablet, Take 81 mg by mouth daily. , Disp: , Rfl:  .  atorvastatin (LIPITOR) 20 MG tablet, Take 1 tablet (20 mg total) by mouth daily at 6 PM., Disp: 90 tablet, Rfl: 1 .  cholecalciferol (VITAMIN D) 1000 UNITS tablet, Take 1,000 Units by mouth daily., Disp: , Rfl:  .  citalopram (CELEXA) 20 MG tablet, Take 1 tablet (20 mg total) by mouth daily., Disp: 90 tablet, Rfl: 0 .  cyanocobalamin 500 MCG tablet, Take 1 tablet (500 mcg total) by mouth daily. (Patient taking differently: Take 1,000 mcg by mouth daily. ), Disp: 90 tablet, Rfl: 3 .  metoprolol tartrate (LOPRESSOR) 25 MG tablet, Take 1 tablet (25 mg total) by mouth 2 (two) times daily., Disp: 180 tablet, Rfl: 1 .  vitamin E 400 UNIT capsule, Take 400 Units by mouth daily., Disp: , Rfl:  .  Omega-3 Fatty Acids (FISH OIL) 1000 MG CAPS, Take 1 capsule by mouth daily. Reported on 05/03/2015, Disp: , Rfl:  .  rivaroxaban (XARELTO) 20 MG TABS tablet, Take 1 tablet (20 mg total) by mouth daily with supper. (Patient not taking: Reported on 05/03/2015), Disp: 15 tablet, Rfl: 3  Past Medical History: Past Medical History  Diagnosis Date  . GLUCOSE INTOLERANCE 12/31/2009  . HYPERLIPIDEMIA 12/31/2009  . ANXIETY 12/31/2009  . DEPRESSION 12/31/2009  . MITRAL REGURGITATION 12/31/2009  . HYPERTENSION 12/31/2009  . CORONARY ARTERY DISEASE 12/31/2009  . MITRAL VALVE PROLAPSE 12/31/2009  .  MENOPAUSE, EARLY 12/31/2009  . Unspecified urinary incontinence 12/31/2009  . ABDOMINAL PAIN, LOWER 12/31/2009  . DIVERTICULITIS, HX OF 12/31/2009  . Pacemaker 01/21/2012    MDT Adapta dual chamber  . Tachycardia-bradycardia syndrome ([place])     [name] 01/21/2012  . PAD (peripheral artery disease) ([place])   . Heart murmur     "used to say I did" (01/21/2012)  . OSTEOARTHRITIS, HAND 12/31/2009  . [place] DISEASE, CERVICAL 12/31/2009  . [place] DISEASE, LUMBAR 12/31/2009  . Symptomatic sinus bradycardia 01/22/2012  . PAF (paroxysmal atrial fibrillation) ([place]) 01/22/2012  . Sinus node dysfunction ([place]) 01/22/2012  . PVD (peripheral vascular disease), hx stents to bil SFAs 02/2010 01/22/2012  . PAD (peripheral artery disease) ([place])   . Atrial fibrillation with RVR ([facility])     Tobacco Use: History  Smoking status  . Former Smoker -- 0.75 packs/day for 10 years  . Types: Cigarettes  Smokeless tobacco  . Never Used    Comment: 01/21/2012 "quit smoking ~ 2002"    Labs:     Recent Review Flowsheet Data    Labs for ITP Cardiac and Pulmonary Rehab Latest Ref Rng 01/04/2015 01/04/2015 01/05/2015 01/31/2015 02/01/2015   Cholestrol 0 - 200 mg/dL - - - - 124   LDLCALC 0 - 99 mg/dL - - - - 68   HDL >40 mg/dL - - - - 34(L)   Trlycerides <150 mg/dL - - - -  110   Hemoglobin A1c 4.8 - 5.6 % - - - 6.4(H) -   PHART 7.350 - 7.450 7.587(H) 7.344(L) - - -   PCO2ART 35.0 - 45.0 mmHg 23.4(L) 41.7 - - -   HCO3 20.0 - 24.0 mEq/L 22.3 22.6 - - -   TCO2 0 - 100 mmol/L 23 24 28  - -   ACIDBASEDEF 0.0 - 2.0 mmol/L - 3.0(H) - - -   O2SAT - 98.0 96.0 - - -      Capillary Blood Glucose: Lab Results  Component Value Date   GLUCAP 119* 01/12/2015   GLUCAP 107* 01/11/2015   GLUCAP 102* 01/08/2015   GLUCAP 116* 01/08/2015   GLUCAP 106* 01/07/2015     Exercise Target Goals: Date: 05/03/15  Exercise Program Goal: Individual exercise prescription set with THRR, safety & activity barriers. Participant  demonstrates ability to understand and report RPE using BORG scale, to self-measure pulse accurately, and to acknowledge the importance of the exercise prescription.  Exercise Prescription Goal: Starting with aerobic activity 30 plus minutes a day, 3 days per week for initial exercise prescription. Provide home exercise prescription and guidelines that participant acknowledges understanding prior to discharge.  Activity Barriers & Risk Stratification:     Activity Barriers & Cardiac Risk Stratification - 04/23/15 1617    Activity Barriers & Cardiac Risk Stratification   Activity Barriers None   Cardiac Risk Stratification High      6 Minute Walk:     6 Minute Walk      05/03/15 1139       6 Minute Walk   Phase Initial     Distance 1673 feet     Walk Time 6 minutes     # of Rest Breaks 0     MPH 3.17     METS 3.55     RPE 11     VO2 Peak 12.42     Symptoms No     Resting HR 85 bpm     Resting BP 122/60 mmHg     Max Ex. HR 108 bpm     Max Ex. BP 134/62 mmHg        Initial Exercise Prescription:     Initial Exercise Prescription - 05/03/15 1100    Date of Initial Exercise Prescription   Date 05/03/15   Bike   Level 0.9   Minutes 10   METs 3   NuStep   Level 3   Minutes 10   METs 2   Track   Laps 10   Minutes 10   METs 2.76   Prescription Details   Frequency (times per week) 3   Duration Progress to 30 minutes of continuous aerobic without signs/symptoms of physical distress   Intensity   THRR 40-80% of Max Heartrate 58-116   Ratings of Perceived Exertion 11-13   Progression   Progression Continue to progress workloads to maintain intensity without signs/symptoms of physical distress.   Resistance Training   Training Prescription Yes   Weight 2 lbs   Reps 10-12      Perform Capillary Blood Glucose checks as needed.  Exercise Prescription Changes:   Exercise Comments:   Discharge Exercise Prescription (Final Exercise Prescription  Changes):   Nutrition:  Target Goals: Understanding of nutrition guidelines, daily intake of sodium 1500mg , cholesterol 200mg , calories 30% from fat and 7% or less from saturated fats, daily to have 5 or more servings of fruits and vegetables.  Biometrics:     Pre Biometrics -  04/25/15 1133    Pre Biometrics   Height 5\' 5"  (1.651 m)   Weight 141 lb 8.6 oz (64.2 kg)   Waist Circumference 29 inches   Hip Circumference 36.5 inches   Waist to Hip Ratio 0.79 %   BMI (Calculated) 23.6   Triceps Skinfold 23 mm   % Body Fat 34 %   Grip Strength 35 kg   Flexibility 14 in       Nutrition Therapy Plan and Nutrition Goals:     Nutrition Therapy & Goals - 04/24/15 0935    Nutrition Therapy   Diet Therapeutic Lifestyle Changes   Personal Nutrition Goals   Personal Goal #1 6-10 lb wt loss from current wt of 142.6 lb at graduation from [employer] Goal #2 Establish healthy eating patterns, emphasizing a variety of nutrient-dense foods in appropriate portion sizes, to improve overall health and specifically to attain blood glucose control through diet.   Intervention Plan   Intervention Prescribe, educate and counsel regarding individualized specific dietary modifications aiming towards targeted core components such as weight, hypertension, lipid management, diabetes, heart failure and other comorbidities.   Expected Outcomes Short Term Goal: Understand basic principles of dietary content, such as calories, fat, sodium, cholesterol and nutrients.;Long Term Goal: Adherence to prescribed nutrition plan.      Nutrition Discharge: Rate Your Plate Scores:   Nutrition Goals Re-Evaluation:   Psychosocial: Target Goals: Acknowledge presence or absence of depression, maximize coping skills, provide positive support system. Participant is able to verbalize types and ability to use techniques and skills needed for reducing stress and depression.  Initial Review & Psychosocial  Screening:     Initial Psych Review & Screening - 05/03/15 1041    Family Dynamics   Good Support System? Yes   Concerns Recent loss of significant other   Comments husband passed away two years ago. Engaged to be married. Hoyle Sauer says she is happy with her life.   Barriers   Psychosocial barriers to participate in program Psychosocial barriers identified (see note)   Screening Interventions   Interventions Encouraged to exercise      Quality of Life Scores:     Quality of Life - 05/03/15 1143    Quality of Life Scores   Health/Function Pre 17.77 %   Socioeconomic Pre 24.17 %   Psych/Spiritual Pre 23.57 %   Family Pre 15.3 %   GLOBAL Pre 19.79 %      PHQ-9:     Recent Review Flowsheet Data    Depression screen [facility] 2/9 05/03/2015 03/16/2015 01/23/2015 08/06/2014   Decreased Interest 0 0 0 3   Down, Depressed, Hopeless 0 0 0 0   PHQ - 2 Score 0 0 0 3   Altered sleeping - - - 1   Tired, decreased energy - - - 3   Change in appetite - - - 0   Feeling bad or failure about yourself  - - - 0   Trouble concentrating - - - 0   Moving slowly or fidgety/restless - - - 3   Suicidal thoughts - - - 0   PHQ-9 Score - - - 10   Difficult doing work/chores - - - Not difficult at all      Psychosocial Evaluation and Intervention:   Psychosocial Re-Evaluation:   Vocational Rehabilitation: Provide vocational rehab assistance to qualifying candidates.   Vocational Rehab Evaluation & Intervention:     Vocational Rehab - 05/03/15 1041    Initial Vocational  Rehab Evaluation & Intervention   Assessment shows need for Vocational Rehabilitation No      Education: Education Goals: Education classes will be provided on a weekly basis, covering required topics. Participant will state understanding/return demonstration of topics presented.  Learning Barriers/Preferences:     Learning Barriers/Preferences - 04/23/15 1617    Learning Barriers/Preferences   Learning Barriers None    Learning Preferences Written Material      Education Topics: Count Your Pulse:  -Group instruction provided by verbal instruction, demonstration, patient participation and written materials to support subject.  Instructors address importance of being able to find your pulse and how to count your pulse when at home without a heart monitor.  Patients get hands on experience counting their pulse with staff help and individually.   Heart Attack, Angina, and Risk Factor Modification:  -Group instruction provided by verbal instruction, video, and written materials to support subject.  Instructors address signs and symptoms of angina and heart attacks.    Also discuss risk factors for heart disease and how to make changes to improve heart health risk factors.   Functional Fitness:  -Group instruction provided by verbal instruction, demonstration, patient participation, and written materials to support subject.  Instructors address safety measures for doing things around the house.  Discuss how to get up and down off the floor, how to pick things up properly, how to safely get out of a chair without assistance, and balance training.          CARDIAC REHAB PHASE II EXERCISE from 05/03/2015 in [facility]   Date  05/03/15   Educator  Alberteen Sam, MA ACSM RCEP   Instruction Review Code  2- meets goals/outcomes      Meditation and Mindfulness:  -Group instruction provided by verbal instruction, patient participation, and written materials to support subject.  Instructor addresses importance of mindfulness and meditation practice to help reduce stress and improve awareness.  Instructor also leads participants through a meditation exercise.    Stretching for Flexibility and Mobility:  -Group instruction provided by verbal instruction, patient participation, and written materials to support subject.  Instructors lead participants through series of stretches that are  designed to increase flexibility thus improving mobility.  These stretches are additional exercise for major muscle groups that are typically performed during regular warm up and cool down.   Hands Only CPR Anytime:  -Group instruction provided by verbal instruction, video, patient participation and written materials to support subject.  Instructors co-teach with AHA video for hands only CPR.  Participants get hands on experience with mannequins.   Nutrition I class: Heart Healthy Eating:  -Group instruction provided by PowerPoint slides, verbal discussion, and written materials to support subject matter. The instructor gives an explanation and review of the Therapeutic Lifestyle Changes diet recommendations, which includes a discussion on lipid goals, dietary fat, sodium, fiber, plant stanol/sterol esters, sugar, and the components of a well-balanced, healthy diet.   Nutrition II class: Lifestyle Skills:  -Group instruction provided by PowerPoint slides, verbal discussion, and written materials to support subject matter. The instructor gives an explanation and review of label reading, grocery shopping for heart health, heart healthy recipe modifications, and ways to make healthier choices when eating out.   Diabetes Question & Answer:  -Group instruction provided by PowerPoint slides, verbal discussion, and written materials to support subject matter. The instructor gives an explanation and review of diabetes co-morbidities, pre- and post-prandial blood glucose goals, pre-exercise blood glucose goals,  signs, symptoms, and treatment of hypoglycemia and hyperglycemia, and foot care basics.   Diabetes Blitz:  -Group instruction provided by PowerPoint slides, verbal discussion, and written materials to support subject matter. The instructor gives an explanation and review of the physiology behind type 1 and type 2 diabetes, diabetes medications and rational behind using different medications, pre-  and post-prandial blood glucose recommendations and Hemoglobin A1c goals, diabetes diet, and exercise including blood glucose guidelines for exercising safely.    Portion Distortion:  -Group instruction provided by PowerPoint slides, verbal discussion, written materials, and food models to support subject matter. The instructor gives an explanation of serving size versus portion size, changes in portions sizes over the last 20 years, and what consists of a serving from each food group.   Stress Management:  -Group instruction provided by verbal instruction, video, and written materials to support subject matter.  Instructors review role of stress in heart disease and how to cope with stress positively.     Exercising on Your Own:  -Group instruction provided by verbal instruction, power point, and written materials to support subject.  Instructors discuss benefits of exercise, components of exercise, frequency and intensity of exercise, and end points for exercise.  Also discuss use of nitroglycerin and activating EMS.  Review options of places to exercise outside of rehab.  Review guidelines for sex with heart disease.   Cardiac Drugs I:  -Group instruction provided by verbal instruction and written materials to support subject.  Instructor reviews cardiac drug classes: antiplatelets, anticoagulants, beta blockers, and statins.  Instructor discusses reasons, side effects, and lifestyle considerations for each drug class.   Cardiac Drugs II:  -Group instruction provided by verbal instruction and written materials to support subject.  Instructor reviews cardiac drug classes: angiotensin converting enzyme inhibitors (ACE-I), angiotensin II receptor blockers (ARBs), nitrates, and calcium channel blockers.  Instructor discusses reasons, side effects, and lifestyle considerations for each drug class.   Anatomy and Physiology of the Circulatory System:  -Group instruction provided by verbal  instruction, video, and written materials to support subject.  Reviews functional anatomy of heart, how it relates to various diagnoses, and what role the heart plays in the overall system.   Knowledge Questionnaire Score:     Knowledge Questionnaire Score - 04/25/15 1133    Knowledge Questionnaire Score   Pre Score 21/24      Core Components/Risk Factors/Patient Goals at Admission:     Personal Goals and Risk Factors at Admission - 04/23/15 1458    Core Components/Risk Factors/Patient Goals on Admission   Expected Outcomes Short Term: Continue to assess and modify interventions until short term weight is achieved.;Long Term: Adherence to nutrition and physical activity/exercise program aimed toward attainment of established weight goal.   Sedentary Yes  since surgery   Intervention Provide advice, education, support and counseling about physical activity/exercise needs.;Develop an individualized exercise prescription for aerobic and resistive training based on initial evaluation findings, risk stratification, comorbidities and participant's personal goals.   Expected Outcomes Achievement of increased cardiorespiratory fitness and enhanced flexibility, muscular endurance and strength shown through measurements of functional capaciy and personal statement of participant.   Tobacco Cessation Yes  previous smoker, quit 15 years ago   Expected Outcomes Long Term: Complete abstinence from all tobacco products for at least 12 months from quit date.   Hypertension Yes   Intervention Provide education on lifestyle modifcations including regular physical activity/exercise, weight management, moderate sodium restriction and increased consumption of fresh fruit, vegetables, and low  fat dairy, alcohol moderation, and smoking cessation.;Monitor prescription use compliance.   Expected Outcomes Short Term: Continued assessment and intervention until BP is < 140/55mm HG in hypertensive participants. <  130/13mm HG in hypertensive participants with diabetes, heart failure or chronic kidney disease.   Lipids Yes   Intervention Provide education and support for participant on nutrition & aerobic/resistive exercise along with prescribed medications to achieve LDL 70mg , HDL >40mg .   Expected Outcomes Short Term: Participant states understanding of desired cholesterol values and is compliant with medications prescribed. Participant is following exercise prescription and nutrition guidelines.;Long Term: Cholesterol controlled with medications as prescribed, with individualized exercise RX and with personalized nutrition plan. Value goals: LDL < 70mg , HDL > 40 mg.   Stress Yes   Personal Goal Other Yes   Personal Goal Increase leg strength   Intervention Design exercise prescription to increase leg strength   Expected Outcomes Able to improve walk test and leg strength      Core Components/Risk Factors/Patient Goals Review:    Core Components/Risk Factors/Patient Goals at Discharge (Final Review):    ITP Comments:     ITP Comments      04/24/15 1623           ITP Comments Dr. Fransico Him, Medical Director          Comments: Patient attended orientation from 945 to 1100to review rules and guidelines for program. Completed 6 minute walk test, Intitial ITP, and exercise prescription.  VSS. Telemetry-Atrial Paced.  Asymptomatic. Upon review of her medications Hoyle Sauer told me she stopped taking her Gwenyth Bouillon two weeks ago due to cost. Dr [employer] office called and notified. Spoke with Alvina Filbert LPN. Rip Harbour spoke with Dr Sallyanne Kuster. Dr Sallyanne Kuster said Hoyle Sauer can proceed with her walk test today he will talk to her about Gwenyth Bouillon at her scheduled office visit on 05/15/2015.

## 2015-05-03 NOTE — Telephone Encounter (Signed)
Maria is calling  °

## 2015-05-03 NOTE — Telephone Encounter (Signed)
Patient Atrial paced with HR 72, blood pressure 122/60 at rehab

## 2015-05-06 ENCOUNTER — Encounter (HOSPITAL_COMMUNITY)
Admission: RE | Admit: 2015-05-06 | Discharge: 2015-05-06 | Disposition: A | Discharge status: Home / Self Care | Payer: Commercial Managed Care - HMO | Source: Ambulatory Visit | Attending: Cardiovascular Disease | Admitting: Cardiovascular Disease

## 2015-05-06 ENCOUNTER — Encounter: Payer: Self-pay | Admitting: Cardiovascular Disease

## 2015-05-06 ENCOUNTER — Ambulatory Visit (HOSPITAL_COMMUNITY): Payer: Commercial Managed Care - HMO

## 2015-05-06 ENCOUNTER — Ambulatory Visit (INDEPENDENT_AMBULATORY_CARE_PROVIDER_SITE_OTHER): Payer: Commercial Managed Care - HMO | Admitting: Cardiovascular Disease

## 2015-05-06 VITALS — BP 128/78 | HR 75 | Ht 64.5 in | Wt 140.8 lb

## 2015-05-06 DIAGNOSIS — I480 Paroxysmal atrial fibrillation: Secondary | ICD-10-CM | POA: Diagnosis not present

## 2015-05-06 DIAGNOSIS — Z951 Presence of aortocoronary bypass graft: Secondary | ICD-10-CM | POA: Diagnosis not present

## 2015-05-06 DIAGNOSIS — Z950 Presence of cardiac pacemaker: Secondary | ICD-10-CM

## 2015-05-06 DIAGNOSIS — R001 Bradycardia, unspecified: Secondary | ICD-10-CM

## 2015-05-06 DIAGNOSIS — E785 Hyperlipidemia, unspecified: Secondary | ICD-10-CM

## 2015-05-06 DIAGNOSIS — I739 Peripheral vascular disease, unspecified: Secondary | ICD-10-CM

## 2015-05-06 DIAGNOSIS — I2510 Atherosclerotic heart disease of native coronary artery without angina pectoris: Secondary | ICD-10-CM

## 2015-05-06 NOTE — Patient Instructions (Signed)
Dr Sallyanne Kuster has recommended making the following medication changes: 1. STOP Amiodarone  Dr Sallyanne Kuster recommends that you schedule a follow-up appointment in 7 months. You will receive a reminder letter in the mail two months in advance. If you don't receive a letter, please call our office to schedule the follow-up appointment.  If you need a refill on your cardiac medications before your next appointment, please call your pharmacy.

## 2015-05-06 NOTE — Progress Notes (Signed)
Patient ID: [name], female   DOB: May 01, 1940, 75 y.o.   MRN: [id number]    Cardiology Office Note    Date:  05/07/2015   ID:  [name], DOB Apr 01, 1940, MRN [id number]  PCP:  [name] Solo, [name]  Cardiologist:   [name] Klein, [name]   Chief Complaint  Patient presents with  . Follow-up    3 month  pt states she is still sore from surgery; c/o SOB on exertion--going up steps, no other Sx.    History of Present Illness:  [name] is a 75 y.o. female who returns in follow-up for roughly 4 months status post multivessel coronary bypass surgery (LIMA to LAD, SVG to PDA, SVG to intermedius branch, 35 mm atrial clip, [place]). She had atrial clipping at the time of bypass surgery. She had postoperative atrial fibrillation and underwent cardioversion on December 22. She had bronchitis in early February and the coughing was difficult due to her sternotomy. She was treated with antibiotics.  She also has a history of peripheral arterial disease and has previously received stents to the superficial femoral arteries, hypertension, hyperlipidemia. She had postoperative atrial fibrillation but has had atrial fibrillation even before her bypass surgery. She has a dual-chamber permanent pacemaker implant in 2013 for symptomatic bradycardia (Medtronic).  She is participating in cardiac rehabilitation and is recovering well from her surgery. She actually looks quite fit and healthy today. She is eager to go back to training with her personal trainer up in [place]. She has mild shortness of breath only when chest to climb stairs. She denies angina pectoris, edema, claudication or syncope.She has not had any palpitations. Her most recent pacemaker interrogation a few weeks ago had not shown any recurrence of atrial fibrillation. She has not had any bleeding problems. She denies any focal neurological events. She is taking a beta blocker, statin and aspirin.  In early March she discontinued her  anticoagulation due to increased bruising and easy skin bleeding. This has been our second attempt at anticoagulation. She previously stopped the medication last summer for similar complaints.   Past Medical History  Diagnosis Date  . GLUCOSE INTOLERANCE 12/31/2009  . HYPERLIPIDEMIA 12/31/2009  . ANXIETY 12/31/2009  . DEPRESSION 12/31/2009  . MITRAL REGURGITATION 12/31/2009  . HYPERTENSION 12/31/2009  . CORONARY ARTERY DISEASE 12/31/2009  . MITRAL VALVE PROLAPSE 12/31/2009  . MENOPAUSE, EARLY 12/31/2009  . Unspecified urinary incontinence 12/31/2009  . ABDOMINAL PAIN, LOWER 12/31/2009  . DIVERTICULITIS, HX OF 12/31/2009  . Pacemaker 01/21/2012    MDT Adapta dual chamber  . Tachycardia-bradycardia syndrome ([place])     [name] 01/21/2012  . PAD (peripheral artery disease) ([place])   . Heart murmur     "used to say I did" (01/21/2012)  . OSTEOARTHRITIS, HAND 12/31/2009  . [place] DISEASE, CERVICAL 12/31/2009  . [place] DISEASE, LUMBAR 12/31/2009  . Symptomatic sinus bradycardia 01/22/2012  . PAF (paroxysmal atrial fibrillation) ([place]) 01/22/2012  . Sinus node dysfunction ([place]) 01/22/2012  . PVD (peripheral vascular disease), hx stents to bil SFAs 02/2010 01/22/2012  . PAD (peripheral artery disease) ([place])   . Atrial fibrillation with [facility])     Past Surgical History  Procedure Laterality Date  . Cardiac catheterization  2005    "negative" (01/21/2012)  . Oophorectomy  ~1979  . Partial colectomy  2010  . Carpal tunnel release  2008    "right hand/thumb; carpal tunnel repair; got rid of arthritis" (01/21/2012)  . Insert / replace / remove pacemaker  01/21/2012  initial placement  . Vaginal hysterectomy  1975  . Posterior cervical laminectomy  1985  . Peripheral arterial stent graft  2012; 2012    "LLE; RLE" (01/21/2012)  . Facelift, lower 2/3  1995    "mini" (01/21/2012)  . Augmentation mammaplasty  1983  . Pacemaker insertion  01/21/2012    Medtronic Adapta, model# ADDRL1,  serial# [id number]  . Lower arterial examination  10/28/2011    R. SFA stent mild-moderate mixed density plaque with elevated velocities consistent with 50% diameter reduction. L. SFA stent moderate mixed denisty plaque at mid to distal level consistent with 50-69% diameter reduction.  . Cardiac catheterization  09/17/1999    Mild CAD involving proximal portion of LAD. Mitral valve prolapse w/ mitral regurg. No evidence of renal artery stenosis. Recommended readjustment of antihypertensive medications.  . Persantine myoview stress test  02/25/2010    No scintigraphic evidence of inducible myocardial ischemia. No persantine EKG changes. Non-diagnositc for ishcemia. Low-risk, normal scan.  Marland Kitchen Permanent pacemaker insertion N/A 01/21/2012    Procedure: PERMANENT PACEMAKER INSERTION;  Surgeon: [name] Klein, [name];  Location: [place] CATH LAB;  Service: Cardiovascular;  Laterality: N/A;  . Cardiac catheterization N/A 01/01/2015    Procedure: Left Heart Cath and Coronary Angiography;  Surgeon: Jettie Booze, [name];  Location: [place] [facility] CV LAB;  Service: Cardiovascular;  Laterality: N/A;  . Coronary artery bypass graft N/A 01/04/2015    Procedure: CORONARY ARTERY BYPASS GRAFTING (CABG) x 3 using left internal mammory artery and greater saphenous vein right leg harvested endoscopically.;  Surgeon: Grace Isaac, [name];  LIMA-LAD, SVG-RI, SVG-PDA  . Tee without cardioversion N/A 01/04/2015    Procedure: TRANSESOPHAGEAL ECHOCARDIOGRAM (TEE);  Surgeon: Grace Isaac, [name];  Location: [facility];  Service: Open Heart Surgery;  Laterality: N/A;  . Clipping of atrial appendage N/A 01/04/2015    Procedure: CLIPPING OF ATRIAL APPENDAGE;  Surgeon: Grace Isaac, [name];  Location: [place];  Service: Open Heart Surgery;  Laterality: N/A;  . Cardioversion N/A 02/01/2015    Procedure: CARDIOVERSION;  Surgeon: Lelon Perla, [name];  [facility] ENDOSCOPY;  Service: Cardiovascular;  Laterality: N/A;  . Tee without cardioversion  N/A 02/01/2015    Procedure: TRANSESOPHAGEAL ECHOCARDIOGRAM (TEE);  Surgeon: Lelon Perla, [name];  [facility] ENDOSCOPY;  Service: Cardiovascular;  Laterality: N/A;  . Cardiac surgery      11/20/20116    Outpatient Prescriptions Prior to Visit  Medication Sig Dispense Refill  . aspirin 81 MG EC tablet Take 81 mg by mouth daily.     Marland Kitchen atorvastatin (LIPITOR) 20 MG tablet Take 1 tablet (20 mg total) by mouth daily at 6 PM. 90 tablet 1  . cholecalciferol (VITAMIN D) 1000 UNITS tablet Take 1,000 Units by mouth daily.    . citalopram (CELEXA) 20 MG tablet Take 1 tablet (20 mg total) by mouth daily. 90 tablet 0  . cyanocobalamin 500 MCG tablet Take 1 tablet (500 mcg total) by mouth daily. (Patient taking differently: Take 1,000 mcg by mouth daily. ) 90 tablet 3  . metoprolol tartrate (LOPRESSOR) 25 MG tablet Take 1 tablet (25 mg total) by mouth 2 (two) times daily. 180 tablet 1  . vitamin E 400 UNIT capsule Take 400 Units by mouth daily.    Marland Kitchen amiodarone (PACERONE) 200 MG tablet Take 1 tablet (200 mg total) by mouth daily. \ 30 tablet 3  . Omega-3 Fatty Acids (FISH OIL) 1000 MG CAPS Take 1 capsule by mouth daily. Reported on 05/03/2015  No facility-administered medications prior to visit.     Allergies:   Clonidine derivatives; Plavix; and Exforge   Social History   Social History  . Marital Status: Widowed    Spouse Name: N/A  . Number of Children: 3  . Years of Education: N/A   Occupational History  . retired Chemical engineer    Social History Main Topics  . Smoking status: Former Smoker -- 0.75 packs/day for 10 years    Types: Cigarettes  . Smokeless tobacco: Never Used     Comment: 01/21/2012 "quit smoking ~ 2002"  . Alcohol Use: 9.0 oz/week    15 Shots of liquor per week     Comment: 01/21/2012 "3 times/wk I have a couple mixed drinks"  . Drug Use: No  . Sexual Activity: Not Currently   Other Topics Concern  . None   Social History Narrative     Family History:   The patient's family history includes Heart disease in her brother and mother; Hypertension in her brother, brother, and mother; Stroke in her brother, father, and mother.   ROS:   Please see the history of present illness.    ROS All other systems reviewed and are negative.   PHYSICAL EXAM:   VS:  BP 128/78 mmHg  Pulse 75  Ht 5' 4.5" (1.638 m)  Wt 63.866 kg (140 lb 12.8 oz)  BMI 23.80 kg/m2   GEN: Well nourished, well developed, in no acute distress HEENT: normal Neck: no JVD, carotid bruits, or masses Cardiac: RRR; no murmurs, rubs, or gallops,no edema , healthy pacemaker site and well-healed sternotomy Respiratory:  clear to auscultation bilaterally, normal work of breathing GI: soft, nontender, nondistended, + BS MS: no deformity or atrophy Skin: warm and dry, no rash Neuro:  Alert and Oriented x 3, Strength and sensation are intact Psych: euthymic mood, full affect  Wt Readings from Last 3 Encounters:  05/06/15 63.866 kg (140 lb 12.8 oz)  04/25/15 64.2 kg (141 lb 8.6 oz)  03/16/15 64.683 kg (142 lb 9.6 oz)      Studies/Labs Reviewed:   EKG:  EKG is not ordered today.   Recent Labs: 01/31/2015: ALT 21; Magnesium 2.1; TSH 2.164 02/01/2015: BUN 10; Creatinine, Ser 0.87; Hemoglobin 12.2; Platelets 353; Potassium 5.2*; Sodium 138   Lipid Panel    Component Value Date/Time   CHOL 124 02/01/2015 0448   TRIG 110 02/01/2015 0448   HDL 34* 02/01/2015 0448   CHOLHDL 3.6 02/01/2015 0448   VLDL 22 02/01/2015 0448   LDLCALC 68 02/01/2015 0448      ASSESSMENT:    1. PAF (paroxysmal atrial fibrillation) ([place])   2. CAD s/p CABG   3. Symptomatic sinus bradycardia   4. S/P placement of cardiac pacemaker, medtronic adapta 01/21/12   5. Hyperlipidemia   6. PVD (peripheral vascular disease), hx stents to bil SFAs 02/2010      PLAN:  In order of problems listed above:  1. AFib; that he understands that there is an increased risk of stroke with atrial fibrillation, when  not taking full anticoagulants. On the other hand she has had her atrial appendage clipped at the time of surgery, now 4 months ago. She had postoperative sustained atrial fibrillation, but there has been no documentation of atrial fibrillation since late December. She does not want to take anticoagulants. She is currently on amiodarone, but I don't think this is a desirable medication in the patient with very low burden of atrial fibrillation in the past.  We have agreed to monitor for recurrence of arrhythmia using her pacemaker. And will renegotiate the use of anticoagulation depending on the prevalence of arrhythmia. Discontinue amiodarone. 2. CAD s/p CABG: She is not completely back to her previous baseline but is doing great overall. Encouraged her to finish the dorsal rehabilitation and then continue exercising with her personal trainer. She is to remain on lifelong statin therapy and I would recommend that she continue beta blockers. 3. SSS: No symptoms of bradycardia 4. PM: Normal function of dual-chamber permanent pacemaker. Continue remote downloads every 3 months and office visit yearly 5. HLP: Excellent recent LDL level 6. PAD: Asymptomatic.    Medication Adjustments/Labs and Tests Ordered: Current medicines are reviewed at length with the patient today.  Concerns regarding medicines are outlined above.  Medication changes, Labs and Tests ordered today are listed in the Patient Instructions below. Patient Instructions  Dr Sallyanne Kuster has recommended making the following medication changes: 1. STOP Amiodarone  Dr Sallyanne Kuster recommends that you schedule a follow-up appointment in 7 months. You will receive a reminder letter in the mail two months in advance. If you don't receive a letter, please call our office to schedule the follow-up appointment.  If you need a refill on your cardiac medications before your next appointment, please call your pharmacy.      Mikael Spray, [name]    05/07/2015 5:50 PM    [facility] [place], [place], [place]  [address] Phone: [phone]; Fax: [phone]

## 2015-05-08 ENCOUNTER — Ambulatory Visit (HOSPITAL_COMMUNITY): Payer: Commercial Managed Care - HMO

## 2015-05-08 ENCOUNTER — Encounter (HOSPITAL_COMMUNITY)
Admission: RE | Admit: 2015-05-08 | Discharge: 2015-05-08 | Disposition: A | Discharge status: Home / Self Care | Payer: Commercial Managed Care - HMO | Source: Ambulatory Visit | Attending: Cardiovascular Disease | Admitting: Cardiovascular Disease

## 2015-05-08 ENCOUNTER — Telehealth: Payer: Self-pay | Admitting: Cardiovascular Disease

## 2015-05-08 DIAGNOSIS — Z951 Presence of aortocoronary bypass graft: Secondary | ICD-10-CM

## 2015-05-08 NOTE — Progress Notes (Signed)
[name]'s blood pressure was noted at 180/80 on the airdyne today at cardiac rehab. Kaliee did not take her metoprolol this morning. "I forgot." recheck blood pressure 158/60 then 154/80. Maudie Mercury, triage nurse Dr Croitoru's office called and notified. Patient said she will take her medication when she gets home. Patient asymptomatic. Exit  Blood pressure 124/60.

## 2015-05-08 NOTE — Telephone Encounter (Signed)
[name] is calling about [name]  Blood Presurre.Marland Kitchen

## 2015-05-08 NOTE — Telephone Encounter (Signed)
Spoke with [name], in Cardiac Rehab, pt BP elevated to 180/80 HR 103 while exercising. Pt was asymptomatic. Pt cooling down at home and BP 154/80 HR 80 at moment. Pt did not take her Lopressor this morning. Told her to remind pt to take medication when she get some. Verbalized understanding, no additional questions.

## 2015-05-09 NOTE — Progress Notes (Signed)
Cardiac Individual Treatment Plan  Patient Details  Name: [name] MRN: [id number] Date of Birth: 1940-10-31 Referring Provider:  Kathyrn Lass, MD  Initial Encounter Date:       CARDIAC REHAB PHASE II EXERCISE from 05/03/2015 in [place]   Date  05/03/15      Visit Diagnosis: S/P CABG (coronary artery bypass graft)  Patient's Home Medications on Admission:  Current outpatient prescriptions:  .  aspirin 81 MG EC tablet, Take 81 mg by mouth daily. , Disp: , Rfl:  .  atorvastatin (LIPITOR) 20 MG tablet, Take 1 tablet (20 mg total) by mouth daily at 6 PM., Disp: 90 tablet, Rfl: 1 .  cholecalciferol (VITAMIN D) 1000 UNITS tablet, Take 1,000 Units by mouth daily., Disp: , Rfl:  .  citalopram (CELEXA) 20 MG tablet, Take 1 tablet (20 mg total) by mouth daily., Disp: 90 tablet, Rfl: 0 .  cyanocobalamin 500 MCG tablet, Take 1 tablet (500 mcg total) by mouth daily. (Patient taking differently: Take 1,000 mcg by mouth daily. ), Disp: 90 tablet, Rfl: 3 .  metoprolol tartrate (LOPRESSOR) 25 MG tablet, Take 1 tablet (25 mg total) by mouth 2 (two) times daily., Disp: 180 tablet, Rfl: 1 .  vitamin E 400 UNIT capsule, Take 400 Units by mouth daily., Disp: , Rfl:   Past Medical History: Past Medical History  Diagnosis Date  . GLUCOSE INTOLERANCE 12/31/2009  . HYPERLIPIDEMIA 12/31/2009  . ANXIETY 12/31/2009  . DEPRESSION 12/31/2009  . MITRAL REGURGITATION 12/31/2009  . HYPERTENSION 12/31/2009  . CORONARY ARTERY DISEASE 12/31/2009  . MITRAL VALVE PROLAPSE 12/31/2009  . MENOPAUSE, EARLY 12/31/2009  . Unspecified urinary incontinence 12/31/2009  . ABDOMINAL PAIN, LOWER 12/31/2009  . DIVERTICULITIS, HX OF 12/31/2009  . Pacemaker 01/21/2012    MDT Adapta dual chamber  . Tachycardia-bradycardia syndrome ([place])     [name] 01/21/2012  . PAD (peripheral artery disease) ([place])   . Heart murmur     "used to say I did" (01/21/2012)  . OSTEOARTHRITIS, HAND 12/31/2009   . [place] DISEASE, CERVICAL 12/31/2009  . [place] DISEASE, LUMBAR 12/31/2009  . Symptomatic sinus bradycardia 01/22/2012  . PAF (paroxysmal atrial fibrillation) ([place]) 01/22/2012  . Sinus node dysfunction ([place]) 01/22/2012  . PVD (peripheral vascular disease), hx stents to bil SFAs 02/2010 01/22/2012  . PAD (peripheral artery disease) ([place])   . Atrial fibrillation with RVR ([facility])     Tobacco Use: History  Smoking status  . Former Smoker -- 0.75 packs/day for 10 years  . Types: Cigarettes  Smokeless tobacco  . Never Used    Comment: 01/21/2012 "quit smoking ~ 2002"    Labs:     Recent Review Flowsheet Data    Labs for ITP Cardiac and Pulmonary Rehab Latest Ref Rng 01/04/2015 01/04/2015 01/05/2015 01/31/2015 02/01/2015   Cholestrol 0 - 200 mg/dL - - - - 124   LDLCALC 0 - 99 mg/dL - - - - 68   HDL >40 mg/dL - - - - 34(L)   Trlycerides <150 mg/dL - - - - 110   Hemoglobin A1c 4.8 - 5.6 % - - - 6.4(H) -   PHART 7.350 - 7.450 7.587(H) 7.344(L) - - -   PCO2ART 35.0 - 45.0 mmHg 23.4(L) 41.7 - - -   HCO3 20.0 - 24.0 mEq/L 22.3 22.6 - - -   TCO2 0 - 100 mmol/L 23 24 28  - -   ACIDBASEDEF 0.0 - 2.0 mmol/L - 3.0(H) - - -  O2SAT - 98.0 96.0 - - -      Capillary Blood Glucose: Lab Results  Component Value Date   GLUCAP 119* 01/12/2015   GLUCAP 107* 01/11/2015   GLUCAP 102* 01/08/2015   GLUCAP 116* 01/08/2015   GLUCAP 106* 01/07/2015     Exercise Target Goals:    Exercise Program Goal: Individual exercise prescription set with THRR, safety & activity barriers. Participant demonstrates ability to understand and report RPE using BORG scale, to self-measure pulse accurately, and to acknowledge the importance of the exercise prescription.  Exercise Prescription Goal: Starting with aerobic activity 30 plus minutes a day, 3 days per week for initial exercise prescription. Provide home exercise prescription and guidelines that participant acknowledges understanding prior to  discharge.  Activity Barriers & Risk Stratification:     Activity Barriers & Cardiac Risk Stratification - 04/23/15 1617    Activity Barriers & Cardiac Risk Stratification   Activity Barriers None   Cardiac Risk Stratification High      6 Minute Walk:     6 Minute Walk      05/03/15 1139       6 Minute Walk   Phase Initial     Distance 1673 feet     Walk Time 6 minutes     # of Rest Breaks 0     MPH 3.17     METS 3.55     RPE 11     VO2 Peak 12.42     Symptoms No     Resting HR 85 bpm     Resting BP 122/60 mmHg     Max Ex. HR 108 bpm     Max Ex. BP 134/62 mmHg        Initial Exercise Prescription:     Initial Exercise Prescription - 05/03/15 1100    Date of Initial Exercise Prescription   Date 05/03/15   Bike   Level 0.9   Minutes 10   METs 3   NuStep   Level 3   Minutes 10   METs 2   Track   Laps 10   Minutes 10   METs 2.76   Prescription Details   Frequency (times per week) 3   Duration Progress to 30 minutes of continuous aerobic without signs/symptoms of physical distress   Intensity   THRR 40-80% of Max Heartrate 58-116   Ratings of Perceived Exertion 11-13   Progression   Progression Continue to progress workloads to maintain intensity without signs/symptoms of physical distress.   Resistance Training   Training Prescription Yes   Weight 2 lbs   Reps 10-12      Perform Capillary Blood Glucose checks as needed.  Exercise Prescription Changes:      Exercise Prescription Changes      05/07/15 1300           Exercise Review   Progression No  Pt's first full day of exercise was 05/06/15       Response to Exercise   Blood Pressure (Admit) 124/66 mmHg       Blood Pressure (Exercise) 138/70 mmHg       Blood Pressure (Exit) 104/64 mmHg       Heart Rate (Admit) 77 bpm       Heart Rate (Exercise) 106 bpm       Heart Rate (Exit) 75 bpm       Rating of Perceived Exertion (Exercise) 12       Duration Progress to 30 minutes of  continuous aerobic without signs/symptoms of physical distress       Intensity THRR unchanged       Progression   Progression Continue to progress workloads to maintain intensity without signs/symptoms of physical distress.       Average METs 3.1       Resistance Training   Training Prescription Yes       Weight 2 lbs       Reps 10-12       Bike   Level 0.9       Minutes 10       METs 3.63       NuStep   Level 3       Minutes 10       METs 2.6       Track   Laps 12       Minutes 10       METs 3.07          Exercise Comments:      Exercise Comments      05/07/15 1325           Exercise Comments Pt has recently started the program and is tolerating exercise well.  Will continue to monitor for exercise progression.          Discharge Exercise Prescription (Final Exercise Prescription Changes):     Exercise Prescription Changes - 05/07/15 1300    Exercise Review   Progression No  Pt's first full day of exercise was 05/06/15   Response to Exercise   Blood Pressure (Admit) 124/66 mmHg   Blood Pressure (Exercise) 138/70 mmHg   Blood Pressure (Exit) 104/64 mmHg   Heart Rate (Admit) 77 bpm   Heart Rate (Exercise) 106 bpm   Heart Rate (Exit) 75 bpm   Rating of Perceived Exertion (Exercise) 12   Duration Progress to 30 minutes of continuous aerobic without signs/symptoms of physical distress   Intensity THRR unchanged   Progression   Progression Continue to progress workloads to maintain intensity without signs/symptoms of physical distress.   Average METs 3.1   Resistance Training   Training Prescription Yes   Weight 2 lbs   Reps 10-12   Bike   Level 0.9   Minutes 10   METs 3.63   NuStep   Level 3   Minutes 10   METs 2.6   Track   Laps 12   Minutes 10   METs 3.07      Nutrition:  Target Goals: Understanding of nutrition guidelines, daily intake of sodium 1500mg , cholesterol 200mg , calories 30% from fat and 7% or less from saturated fats, daily to  have 5 or more servings of fruits and vegetables.  Biometrics:     Pre Biometrics - 04/25/15 1133    Pre Biometrics   Height 5\' 5"  (1.651 m)   Weight 141 lb 8.6 oz (64.2 kg)   Waist Circumference 29 inches   Hip Circumference 36.5 inches   Waist to Hip Ratio 0.79 %   BMI (Calculated) 23.6   Triceps Skinfold 23 mm   % Body Fat 34 %   Grip Strength 35 kg   Flexibility 14 in       Nutrition Therapy Plan and Nutrition Goals:     Nutrition Therapy & Goals - 04/24/15 0935    Nutrition Therapy   Diet Therapeutic Lifestyle Changes   Personal Nutrition Goals   Personal Goal #1 6-10 lb wt loss from current wt of 142.6 lb at graduation from  Cardiac Rehab   Personal Goal #2 Establish healthy eating patterns, emphasizing a variety of nutrient-dense foods in appropriate portion sizes, to improve overall health and specifically to attain blood glucose control through diet.   Intervention Plan   Intervention Prescribe, educate and counsel regarding individualized specific dietary modifications aiming towards targeted core components such as weight, hypertension, lipid management, diabetes, heart failure and other comorbidities.   Expected Outcomes Short Term Goal: Understand basic principles of dietary content, such as calories, fat, sodium, cholesterol and nutrients.;Long Term Goal: Adherence to prescribed nutrition plan.      Nutrition Discharge: Nutrition Scores:   Nutrition Goals Re-Evaluation:   Psychosocial: Target Goals: Acknowledge presence or absence of depression, maximize coping skills, provide positive support system. Participant is able to verbalize types and ability to use techniques and skills needed for reducing stress and depression.  Initial Review & Psychosocial Screening:     Initial Psych Review & Screening - 05/03/15 1041    Family Dynamics   Good Support System? Yes   Concerns Recent loss of significant other   Comments husband passed away two years ago.  Engaged to be married. [name] says she is happy with her life.   Barriers   Psychosocial barriers to participate in program Psychosocial barriers identified (see note)   Screening Interventions   Interventions Encouraged to exercise      Quality of Life Scores:     Quality of Life - 05/09/15 1227    Quality of Life Scores   Health/Function Pre --  [name] reported that she has experienced some shortness of breath but it is getting better      PHQ-9:     Recent Review Flowsheet Data    Depression screen [facility] 2/9 05/03/2015 03/16/2015 01/23/2015 08/06/2014   Decreased Interest 0 0 0 3   Down, Depressed, Hopeless 0 0 0 0   PHQ - 2 Score 0 0 0 3   Altered sleeping - - - 1   Tired, decreased energy - - - 3   Change in appetite - - - 0   Feeling bad or failure about yourself  - - - 0   Trouble concentrating - - - 0   Moving slowly or fidgety/restless - - - 3   Suicidal thoughts - - - 0   PHQ-9 Score - - - 10   Difficult doing work/chores - - - Not difficult at all      Psychosocial Evaluation and Intervention:   Psychosocial Re-Evaluation:   Vocational Rehabilitation: Provide vocational rehab assistance to qualifying candidates.   Vocational Rehab Evaluation & Intervention:     Vocational Rehab - 05/03/15 1041    Initial Vocational Rehab Evaluation & Intervention   Assessment shows need for Vocational Rehabilitation No      Education: Education Goals: Education classes will be provided on a weekly basis, covering required topics. Participant will state understanding/return demonstration of topics presented.  Learning Barriers/Preferences:     Learning Barriers/Preferences - 04/23/15 1617    Learning Barriers/Preferences   Learning Barriers None   Learning Preferences Written Material      Education Topics: Count Your Pulse:  -Group instruction provided by verbal instruction, demonstration, patient participation and written materials to support subject.   Instructors address importance of being able to find your pulse and how to count your pulse when at home without a heart monitor.  Patients get hands on experience counting their pulse with staff help and individually.   Heart Attack, Angina, and  Risk Factor Modification:  -Group instruction provided by verbal instruction, video, and written materials to support subject.  Instructors address signs and symptoms of angina and heart attacks.    Also discuss risk factors for heart disease and how to make changes to improve heart health risk factors.   Functional Fitness:  -Group instruction provided by verbal instruction, demonstration, patient participation, and written materials to support subject.  Instructors address safety measures for doing things around the house.  Discuss how to get up and down off the floor, how to pick things up properly, how to safely get out of a chair without assistance, and balance training.          CARDIAC REHAB PHASE II EXERCISE from 05/03/2015 in [place]   Date  05/03/15   Educator  Alberteen Sam, MA ACSM RCEP   Instruction Review Code  2- meets goals/outcomes      Meditation and Mindfulness:  -Group instruction provided by verbal instruction, patient participation, and written materials to support subject.  Instructor addresses importance of mindfulness and meditation practice to help reduce stress and improve awareness.  Instructor also leads participants through a meditation exercise.    Stretching for Flexibility and Mobility:  -Group instruction provided by verbal instruction, patient participation, and written materials to support subject.  Instructors lead participants through series of stretches that are designed to increase flexibility thus improving mobility.  These stretches are additional exercise for major muscle groups that are typically performed during regular warm up and cool down.   Hands Only CPR Anytime:   -Group instruction provided by verbal instruction, video, patient participation and written materials to support subject.  Instructors co-teach with AHA video for hands only CPR.  Participants get hands on experience with mannequins.   Nutrition I class: Heart Healthy Eating:  -Group instruction provided by PowerPoint slides, verbal discussion, and written materials to support subject matter. The instructor gives an explanation and review of the Therapeutic Lifestyle Changes diet recommendations, which includes a discussion on lipid goals, dietary fat, sodium, fiber, plant stanol/sterol esters, sugar, and the components of a well-balanced, healthy diet.   Nutrition II class: Lifestyle Skills:  -Group instruction provided by PowerPoint slides, verbal discussion, and written materials to support subject matter. The instructor gives an explanation and review of label reading, grocery shopping for heart health, heart healthy recipe modifications, and ways to make healthier choices when eating out.   Diabetes Question & Answer:  -Group instruction provided by PowerPoint slides, verbal discussion, and written materials to support subject matter. The instructor gives an explanation and review of diabetes co-morbidities, pre- and post-prandial blood glucose goals, pre-exercise blood glucose goals, signs, symptoms, and treatment of hypoglycemia and hyperglycemia, and foot care basics.   Diabetes Blitz:  -Group instruction provided by PowerPoint slides, verbal discussion, and written materials to support subject matter. The instructor gives an explanation and review of the physiology behind type 1 and type 2 diabetes, diabetes medications and rational behind using different medications, pre- and post-prandial blood glucose recommendations and Hemoglobin A1c goals, diabetes diet, and exercise including blood glucose guidelines for exercising safely.    Portion Distortion:  -Group instruction provided by  PowerPoint slides, verbal discussion, written materials, and food models to support subject matter. The instructor gives an explanation of serving size versus portion size, changes in portions sizes over the last 20 years, and what consists of a serving from each food group.   Stress Management:  -Group instruction provided  by verbal instruction, video, and written materials to support subject matter.  Instructors review role of stress in heart disease and how to cope with stress positively.     Exercising on Your Own:  -Group instruction provided by verbal instruction, power point, and written materials to support subject.  Instructors discuss benefits of exercise, components of exercise, frequency and intensity of exercise, and end points for exercise.  Also discuss use of nitroglycerin and activating EMS.  Review options of places to exercise outside of rehab.  Review guidelines for sex with heart disease.   Cardiac Drugs I:  -Group instruction provided by verbal instruction and written materials to support subject.  Instructor reviews cardiac drug classes: antiplatelets, anticoagulants, beta blockers, and statins.  Instructor discusses reasons, side effects, and lifestyle considerations for each drug class.   Cardiac Drugs II:  -Group instruction provided by verbal instruction and written materials to support subject.  Instructor reviews cardiac drug classes: angiotensin converting enzyme inhibitors (ACE-I), angiotensin II receptor blockers (ARBs), nitrates, and calcium channel blockers.  Instructor discusses reasons, side effects, and lifestyle considerations for each drug class.   Anatomy and Physiology of the Circulatory System:  -Group instruction provided by verbal instruction, video, and written materials to support subject.  Reviews functional anatomy of heart, how it relates to various diagnoses, and what role the heart plays in the overall system.   Knowledge Questionnaire  Score:     Knowledge Questionnaire Score - 04/25/15 1133    Knowledge Questionnaire Score   Pre Score 21/24      Core Components/Risk Factors/Patient Goals at Admission:     Personal Goals and Risk Factors at Admission - 04/23/15 1458    Core Components/Risk Factors/Patient Goals on Admission   Expected Outcomes Short Term: Continue to assess and modify interventions until short term weight is achieved.;Long Term: Adherence to nutrition and physical activity/exercise program aimed toward attainment of established weight goal.   Sedentary Yes  since surgery   Intervention Provide advice, education, support and counseling about physical activity/exercise needs.;Develop an individualized exercise prescription for aerobic and resistive training based on initial evaluation findings, risk stratification, comorbidities and participant's personal goals.   Expected Outcomes Achievement of increased cardiorespiratory fitness and enhanced flexibility, muscular endurance and strength shown through measurements of functional capaciy and personal statement of participant.   Tobacco Cessation Yes  previous smoker, quit 15 years ago   Expected Outcomes Long Term: Complete abstinence from all tobacco products for at least 12 months from quit date.   Hypertension Yes   Intervention Provide education on lifestyle modifcations including regular physical activity/exercise, weight management, moderate sodium restriction and increased consumption of fresh fruit, vegetables, and low fat dairy, alcohol moderation, and smoking cessation.;Monitor prescription use compliance.   Expected Outcomes Short Term: Continued assessment and intervention until BP is < 140/61mm HG in hypertensive participants. < 130/77mm HG in hypertensive participants with diabetes, heart failure or chronic kidney disease.   Lipids Yes   Intervention Provide education and support for participant on nutrition & aerobic/resistive exercise along  with prescribed medications to achieve LDL 70mg , HDL >40mg .   Expected Outcomes Short Term: Participant states understanding of desired cholesterol values and is compliant with medications prescribed. Participant is following exercise prescription and nutrition guidelines.;Long Term: Cholesterol controlled with medications as prescribed, with individualized exercise RX and with personalized nutrition plan. Value goals: LDL < 70mg , HDL > 40 mg.   Stress Yes   Personal Goal Other Yes   Personal Goal Increase leg  strength   Intervention Design exercise prescription to increase leg strength   Expected Outcomes Able to improve walk test and leg strength      Core Components/Risk Factors/Patient Goals Review:    Core Components/Risk Factors/Patient Goals at Discharge (Final Review):    ITP Comments:     ITP Comments      04/24/15 1623           ITP Comments Dr. Fransico Him, Medical Director          Comments:30 DAY ITP REVIEW Pt is making expected progress toward personal goals after completing 3 sessions.   Recommend continued exercise and life style modification education including  stress management and relaxation techniques to decrease cardiac risk profile. [name] only plans to participate in the program for a few weeks so that she can continue exercise with her personal trainer.

## 2015-05-10 ENCOUNTER — Encounter (HOSPITAL_COMMUNITY)
Admission: RE | Admit: 2015-05-10 | Discharge: 2015-05-10 | Disposition: A | Discharge status: Home / Self Care | Payer: Commercial Managed Care - HMO | Source: Ambulatory Visit | Attending: Cardiovascular Disease | Admitting: Cardiovascular Disease

## 2015-05-10 DIAGNOSIS — Z951 Presence of aortocoronary bypass graft: Secondary | ICD-10-CM

## 2015-05-10 NOTE — Progress Notes (Signed)
Reviewed home exercise with pt today.  Pt plans to walk and use treadmill at home for exercise.  Reviewed THR, pulse, RPE, sign and symptoms, and when to call 911 or MD.  Also discussed weather considerations and indoor options.  Pt voiced understanding. Alberteen Sam, MA, ACSM RCEP

## 2015-05-11 ENCOUNTER — Inpatient Hospital Stay (HOSPITAL_COMMUNITY)
Admission: EM | Admit: 2015-05-11 | Discharge: 2015-05-15 | DRG: 246 | Disposition: A | Discharge status: Home / Self Care | Payer: Commercial Managed Care - HMO | Attending: Cardiovascular Disease | Admitting: Cardiovascular Disease

## 2015-05-11 ENCOUNTER — Emergency Department (HOSPITAL_COMMUNITY): Payer: Commercial Managed Care - HMO

## 2015-05-11 ENCOUNTER — Inpatient Hospital Stay (HOSPITAL_COMMUNITY): Payer: Commercial Managed Care - HMO

## 2015-05-11 ENCOUNTER — Encounter (HOSPITAL_COMMUNITY): Payer: Self-pay | Admitting: Emergency Medicine

## 2015-05-11 DIAGNOSIS — Y838 Other surgical procedures as the cause of abnormal reaction of the patient, or of later complication, without mention of misadventure at the time of the procedure: Secondary | ICD-10-CM | POA: Diagnosis present

## 2015-05-11 DIAGNOSIS — R079 Chest pain, unspecified: Secondary | ICD-10-CM | POA: Diagnosis present

## 2015-05-11 DIAGNOSIS — T82858A Stenosis of vascular prosthetic devices, implants and grafts, initial encounter: Secondary | ICD-10-CM | POA: Diagnosis present

## 2015-05-11 DIAGNOSIS — Z950 Presence of cardiac pacemaker: Secondary | ICD-10-CM

## 2015-05-11 DIAGNOSIS — I340 Nonrheumatic mitral (valve) insufficiency: Secondary | ICD-10-CM | POA: Diagnosis present

## 2015-05-11 DIAGNOSIS — I480 Paroxysmal atrial fibrillation: Secondary | ICD-10-CM | POA: Diagnosis present

## 2015-05-11 DIAGNOSIS — I2510 Atherosclerotic heart disease of native coronary artery without angina pectoris: Secondary | ICD-10-CM | POA: Diagnosis present

## 2015-05-11 DIAGNOSIS — Z888 Allergy status to other drugs, medicaments and biological substances status: Secondary | ICD-10-CM

## 2015-05-11 DIAGNOSIS — F329 Major depressive disorder, single episode, unspecified: Secondary | ICD-10-CM | POA: Diagnosis present

## 2015-05-11 DIAGNOSIS — Z7901 Long term (current) use of anticoagulants: Secondary | ICD-10-CM

## 2015-05-11 DIAGNOSIS — Z955 Presence of coronary angioplasty implant and graft: Secondary | ICD-10-CM | POA: Insufficient documentation

## 2015-05-11 DIAGNOSIS — Z8249 Family history of ischemic heart disease and other diseases of the circulatory system: Secondary | ICD-10-CM | POA: Diagnosis not present

## 2015-05-11 DIAGNOSIS — E785 Hyperlipidemia, unspecified: Secondary | ICD-10-CM | POA: Diagnosis present

## 2015-05-11 DIAGNOSIS — I739 Peripheral vascular disease, unspecified: Secondary | ICD-10-CM | POA: Diagnosis present

## 2015-05-11 DIAGNOSIS — I214 Non-ST elevation (NSTEMI) myocardial infarction: Secondary | ICD-10-CM | POA: Diagnosis present

## 2015-05-11 DIAGNOSIS — Z7982 Long term (current) use of aspirin: Secondary | ICD-10-CM

## 2015-05-11 DIAGNOSIS — I25110 Atherosclerotic heart disease of native coronary artery with unstable angina pectoris: Secondary | ICD-10-CM | POA: Diagnosis not present

## 2015-05-11 DIAGNOSIS — I10 Essential (primary) hypertension: Secondary | ICD-10-CM | POA: Diagnosis present

## 2015-05-11 DIAGNOSIS — Z87891 Personal history of nicotine dependence: Secondary | ICD-10-CM | POA: Diagnosis not present

## 2015-05-11 DIAGNOSIS — I341 Nonrheumatic mitral (valve) prolapse: Secondary | ICD-10-CM | POA: Diagnosis present

## 2015-05-11 DIAGNOSIS — I25710 Atherosclerosis of autologous vein coronary artery bypass graft(s) with unstable angina pectoris: Secondary | ICD-10-CM | POA: Diagnosis not present

## 2015-05-11 DIAGNOSIS — Z951 Presence of aortocoronary bypass graft: Secondary | ICD-10-CM | POA: Diagnosis not present

## 2015-05-11 DIAGNOSIS — Z79899 Other long term (current) drug therapy: Secondary | ICD-10-CM

## 2015-05-11 DIAGNOSIS — F419 Anxiety disorder, unspecified: Secondary | ICD-10-CM | POA: Diagnosis present

## 2015-05-11 HISTORY — DX: Non-ST elevation (NSTEMI) myocardial infarction: I21.4

## 2015-05-11 LAB — CBC
HCT: 40.6 % (ref 36.0–46.0)
Hemoglobin: 13.3 g/dL (ref 12.0–15.0)
MCH: 28.7 pg (ref 26.0–34.0)
MCHC: 32.8 g/dL (ref 30.0–36.0)
MCV: 87.7 fL (ref 78.0–100.0)
Platelets: 178 10*3/uL (ref 150–400)
RBC: 4.63 MIL/uL (ref 3.87–5.11)
RDW: 14.5 % (ref 11.5–15.5)
WBC: 5.9 10*3/uL (ref 4.0–10.5)

## 2015-05-11 LAB — BASIC METABOLIC PANEL
Anion gap: 7 (ref 5–15)
BUN: 17 mg/dL (ref 6–20)
CO2: 25 mmol/L (ref 22–32)
Calcium: 10.4 mg/dL — ABNORMAL HIGH (ref 8.9–10.3)
Chloride: 105 mmol/L (ref 101–111)
Creatinine, Ser: 0.83 mg/dL (ref 0.44–1.00)
GFR calc Af Amer: >60 mL/min (ref >60)
GFR calc non Af Amer: >60 mL/min (ref >60)
Glucose, Bld: 104 mg/dL — ABNORMAL HIGH (ref 65–99)
Potassium: 4.9 mmol/L (ref 3.5–5.1)
Sodium: 137 mmol/L (ref 135–145)

## 2015-05-11 LAB — I-STAT TROPONIN, ED: Troponin i, poc: 0.14 ng/mL (ref 0.00–0.08)

## 2015-05-11 MED ORDER — NITROGLYCERIN IN D5W 200-5 MCG/ML-% IV SOLN
2.0000 ug/min | INTRAVENOUS | Status: DC
  Administered 2015-05-11: 5 ug/min via INTRAVENOUS
  Filled 2015-05-11: qty 250

## 2015-05-11 MED ORDER — IOPAMIDOL (ISOVUE-370) INJECTION 76%
100.0000 mL | Freq: Once | INTRAVENOUS | Status: AC | PRN
  Administered 2015-05-11: 100 mL via INTRAVENOUS

## 2015-05-11 NOTE — ED Notes (Signed)
Amion sent to Radford Pax, MD to request level of care change. Per patient placement, sub-acute does not exist as a bed option

## 2015-05-11 NOTE — H&P (Signed)
Admit date: 05/11/2015 Referring Physician: Dr. Thomasene Lot Primary Cardiologist: Dr. Sallyanne Kuster Chief complaint/reason for admission:Chest pain  HPI: 75 y.o. female who has a history of ASCAD and is 4 months status post multivessel coronary bypass surgery (LIMA to LAD, SVG to PDA, SVG to intermedius branch, 35 mm atrial clip, Ger[place]). She had atrial clipping at the time of bypass surgery. She had postoperative atrial fibrillation and underwent cardioversion on December 22. She had bronchitis in early February and she was treated with antibiotics.  She also has a history of peripheral arterial disease and has previously received stents to the superficial femoral arteries, hypertension, hyperlipidemia. She had postoperative atrial fibrillation but has had atrial fibrillation even before her bypass surgery. She has a dual-chamber permanent pacemaker implant in 2013 for symptomatic bradycardia (Medtronic).  She has been participating in cardiac rehabilitation.  She was recently seen by Dr. Sallyanne Kuster a few days ago and complained of mild shortness of breath only when climbing stairs. She denied angina pectoris, edema, claudication or syncope.  Her most recent pacemaker interrogation a few weeks ago had not shown any recurrence of atrial fibrillation.   In early March she discontinued her anticoagulation due to increased bruising and easy skin bleeding. This has been a second attempt at anticoagulation. She previously stopped the medication last summer for similar complaints.  She says that for the 3 days since seen Dr. Sallyanne Kuster, she has had intermittent chest pain reminiscent of her previous anginal symptoms.  This mainly occurred with exertion and was associated with SOB and would resolve with rest.  Today her pain got worse with radiation into her back and some tingling and pain in her arms.  At home her BP was reportedly 180/113 mmHg.  She currently is having some very mild discomfort in her chest and in  her back.       PMH:    Past Medical History  Diagnosis Date  . GLUCOSE INTOLERANCE 12/31/2009  . HYPERLIPIDEMIA 12/31/2009  . ANXIETY 12/31/2009  . DEPRESSION 12/31/2009  . MITRAL REGURGITATION 12/31/2009  . HYPERTENSION 12/31/2009  . CORONARY ARTERY DISEASE 12/31/2009  . MITRAL VALVE PROLAPSE 12/31/2009  . MENOPAUSE, EARLY 12/31/2009  . Unspecified urinary incontinence 12/31/2009  . ABDOMINAL PAIN, LOWER 12/31/2009  . DIVERTICULITIS, HX OF 12/31/2009  . Pacemaker 01/21/2012    MDT Adapta dual chamber  . Tachycardia-bradycardia syndrome ([place])     [name] 01/21/2012  . PAD (peripheral artery disease) ([place])   . Heart murmur     "used to say I did" (01/21/2012)  . OSTEOARTHRITIS, HAND 12/31/2009  . [place] DISEASE, CERVICAL 12/31/2009  . [place] DISEASE, LUMBAR 12/31/2009  . Symptomatic sinus bradycardia 01/22/2012  . PAF (paroxysmal atrial fibrillation) ([place]) 01/22/2012  . Sinus node dysfunction ([place]) 01/22/2012  . PVD (peripheral vascular disease), hx stents to bil SFAs 02/2010 01/22/2012  . PAD (peripheral artery disease) ([place])   . Atrial fibrillation with RVR ([facility])     PSH:    Past Surgical History  Procedure Laterality Date  . Cardiac catheterization  2005    "negative" (01/21/2012)  . Oophorectomy  ~1979  . Partial colectomy  2010  . Carpal tunnel release  2008    "right hand/thumb; carpal tunnel repair; got rid of arthritis" (01/21/2012)  . Insert / replace / remove pacemaker  01/21/2012    initial placement  . Vaginal hysterectomy  1975  . Posterior cervical laminectomy  1985  . Peripheral arterial stent graft  2012; 2012    "LLE; RLE" (01/21/2012)  .  Facelift, lower 2/3  1995    "mini" (01/21/2012)  . Augmentation mammaplasty  1983  . Pacemaker insertion  01/21/2012    Medtronic Adapta, model# ADDRL1, serial# [id number]  . Lower arterial examination  10/28/2011    R. SFA stent mild-moderate mixed density plaque with elevated velocities consistent with 50%  diameter reduction. L. SFA stent moderate mixed denisty plaque at mid to distal level consistent with 50-69% diameter reduction.  . Cardiac catheterization  09/17/1999    Mild CAD involving proximal portion of LAD. Mitral valve prolapse w/ mitral regurg. No evidence of renal artery stenosis. Recommended readjustment of antihypertensive medications.  . Persantine myoview stress test  02/25/2010    No scintigraphic evidence of inducible myocardial ischemia. No persantine EKG changes. Non-diagnositc for ishcemia. Low-risk, normal scan.  Marland Kitchen Permanent pacemaker insertion N/A 01/21/2012    Procedure: PERMANENT PACEMAKER INSERTION;  Surgeon: Sanda Klein, MD;  Location: [place] CATH LAB;  Service: Cardiovascular;  Laterality: N/A;  . Cardiac catheterization N/A 01/01/2015    Procedure: Left Heart Cath and Coronary Angiography;  Surgeon: Jettie Booze, MD;  Location: [place] CV LAB;  Service: Cardiovascular;  Laterality: N/A;  . Coronary artery bypass graft N/A 01/04/2015    Procedure: CORONARY ARTERY BYPASS GRAFTING (CABG) x 3 using left internal mammory artery and greater saphenous vein right leg harvested endoscopically.;  Surgeon: Grace Isaac, MD;  LIMA-LAD, SVG-RI, SVG-PDA  . Tee without cardioversion N/A 01/04/2015    Procedure: TRANSESOPHAGEAL ECHOCARDIOGRAM (TEE);  Surgeon: Grace Isaac, MD;  Location: [place];  Service: Open Heart Surgery;  Laterality: N/A;  . Clipping of atrial appendage N/A 01/04/2015    Procedure: CLIPPING OF ATRIAL APPENDAGE;  Surgeon: Grace Isaac, MD;  Location: [place];  Service: Open Heart Surgery;  Laterality: N/A;  . Cardioversion N/A 02/01/2015    Procedure: CARDIOVERSION;  Surgeon: Lelon Perla, MD;  [facility] ENDOSCOPY;  Service: Cardiovascular;  Laterality: N/A;  . Tee without cardioversion N/A 02/01/2015    Procedure: TRANSESOPHAGEAL ECHOCARDIOGRAM (TEE);  Surgeon: Lelon Perla, MD;  Location: [facility] ENDOSCOPY;  Service: Cardiovascular;   Laterality: N/A;  . Cardiac surgery      11/20/20116    ALLERGIES:   Clonidine derivatives; Plavix; and Exforge  Prior to Admit Meds:   (Not in a hospital admission) Family HX:    Family History  Problem Relation Age of Onset  . Heart disease Brother     3 brothers with heart disease (2 also with CVA)  . Hypertension Brother   . Heart disease Mother   . Hypertension Mother   . Stroke Mother   . Stroke Father   . Hypertension Brother   . Stroke Brother    Social HX:    Social History   Social History  . Marital Status: Widowed    Spouse Name: N/A  . Number of Children: 3  . Years of Education: N/A   Occupational History  . retired Chemical engineer    Social History Main Topics  . Smoking status: Former Smoker -- 0.75 packs/day for 10 years    Types: Cigarettes  . Smokeless tobacco: Never Used     Comment: 01/21/2012 "quit smoking ~ 2002"  . Alcohol Use: 9.0 oz/week    15 Shots of liquor per week     Comment: 01/21/2012 "3 times/wk I have a couple mixed drinks"  . Drug Use: No  . Sexual Activity: Not Currently   Other Topics Concern  . Not on file  Social History Narrative     ROS:  All 11 ROS were addressed and are negative except what is stated in the HPI  PHYSICAL EXAM Filed Vitals:   05/11/15 1958  BP: 167/79  Pulse: 75  Temp: 98.2 F (36.8 C)  Resp: 18   General: Well developed, well nourished, in no acute distress Head: Eyes PERRLA, No xanthomas.   Normal cephalic and atramatic  Lungs:   Clear bilaterally to auscultation and percussion. Heart:   HRRR S1 S2 Pulses are 2+ & equal.            No carotid bruit. No JVD.  No abdominal bruits. No femoral bruits. Abdomen: Bowel sounds are positive, abdomen soft and non-tender without masses  Extremities:   No clubbing, cyanosis or edema.  DP +1 Neuro: Alert and oriented X 3. Psych:  Good affect, responds appropriately   Labs:   Lab Results  Component Value Date   WBC 5.9 05/11/2015   HGB 13.3  05/11/2015   HCT 40.6 05/11/2015   MCV 87.7 05/11/2015   PLT 178 05/11/2015    Recent Labs Lab 05/11/15 2019  NA 137  K 4.9  CL 105  CO2 25  BUN 17  CREATININE 0.83  CALCIUM 10.4*  GLUCOSE 104*   Lab Results  Component Value Date   CKTOTAL 29 02/21/2010   CKMB 0.9 02/21/2010   TROPONINI <0.03 01/31/2015   No results found for: PTT Lab Results  Component Value Date   INR 1.65* 01/31/2015   INR 1.37 01/04/2015   INR 1.05 01/03/2015     Lab Results  Component Value Date   CHOL 124 02/01/2015   Lab Results  Component Value Date   HDL 34* 02/01/2015   Lab Results  Component Value Date   LDLCALC 68 02/01/2015   Lab Results  Component Value Date   TRIG 110 02/01/2015   Lab Results  Component Value Date   CHOLHDL 3.6 02/01/2015   No results found for: LDLDIRECT    Radiology:  Dg Chest 2 View  05/11/2015  CLINICAL DATA:  Chest pain 2 days radiating to back and right arm. EXAM: CHEST  2 VIEW COMPARISON:  04/02/2015 FINDINGS: Left-sided pacemaker and sternotomy wires unchanged. Lungs are adequately inflated without consolidation or effusion. Mild stable cardiomegaly. Degenerate change of the spine. IMPRESSION: No active cardiopulmonary disease. Stable cardiomegaly. Electronically Signed   By: Marin Olp M.D.   On: 05/11/2015 21:19    EKG:  Atrial paced with ST segment depression in the lateral precordial leads  ASSESSMENT:/PLAN:   1.  NSTEMI - patient has had increasing CP over the past 3 days with exertion and resolving with rest.  Today pain much worse.  Currently with some mild pain in her chest pain back.  Pain very similar to her pain prior to CABG 4 months ago with radiation to her arms.  Trop POC mildly elevated at .41  Her BP was 180/122mmHg at home during the pain and the pain radiates into her back and is quite uncomfortable to the point she has a hard time finding a position to sit in that is comfortable.  Her distal pulses are equal but I think we need  to get a CT angio aortic dissection protocol to rule out dissection.  If this is negative then will start IV Heparin gtt per pharmacy protocol and IV NTG gtt. Continue to cycle enzymes.  Continue ASA/BB/statin.  If enzymes continue to climb and she will likely need cath  on Monday to assess whether she has had early graft closure. 2.  ASCAD s/p CABG 4 months ago. 3.  HTN - BP elevated earlier today but controlled at present. 4.  Dyslipidemia - continue statin 5.  PAF s/p atrial clipping - currently A paced.  She had been on anticoagulation twice in the past for a CHADS2VASC score of 5 but did not tolerate due to bruising.   6.  PVD with history of stenting to the SFA - continue ASA/statin 7.  Tachybrady syndrome s/p PPM  Sueanne Margarita, MD  05/11/2015  10:18 PM

## 2015-05-11 NOTE — ED Notes (Signed)
Per EMS, pt began having CP 2 days ago, which became worse today. Describes as a heavy feeling that begins in the chest and radiates to R arm & back. Pt has pacemaker and hx of open-heart surgery in Nov 2016. Currently stopped taking her prescribed blood thinner on her own. Took 324mg  ASA approx 2hrs prior to EMS arrival, and received 1x Nitro from EMS. No further complaint of CP after nitro.

## 2015-05-11 NOTE — ED Provider Notes (Signed)
CSN: [id number]     Arrival date & time 05/11/15  1953 History   First MD Initiated Contact with Patient 05/11/15 2104     Chief Complaint  Patient presents with  . Chest Pain     (Consider location/radiation/quality/duration/timing/severity/associated sxs/prior Treatment) HPI   Patient is a 75 year old female with past medical history of CABG, pacemaker, A. fib with recent cardioversion.  Patient presenting today with chest pressure radiating to bilateral arms. This is been on and off again. Patient says she just went to her cardiologist earlier this week. He said this was normal after CABG.   Today she felt like the pain got worse. Patient took 325 aspirin prior to arrival.      Past Medical History  Diagnosis Date  . GLUCOSE INTOLERANCE 12/31/2009  . HYPERLIPIDEMIA 12/31/2009  . ANXIETY 12/31/2009  . DEPRESSION 12/31/2009  . MITRAL REGURGITATION 12/31/2009  . HYPERTENSION 12/31/2009  . CORONARY ARTERY DISEASE 12/31/2009  . MITRAL VALVE PROLAPSE 12/31/2009  . MENOPAUSE, EARLY 12/31/2009  . Unspecified urinary incontinence 12/31/2009  . ABDOMINAL PAIN, LOWER 12/31/2009  . DIVERTICULITIS, HX OF 12/31/2009  . Pacemaker 01/21/2012    MDT Adapta dual chamber  . Tachycardia-bradycardia syndrome ([place])     [name] 01/21/2012  . PAD (peripheral artery disease) ([place])   . Heart murmur     "used to say I did" (01/21/2012)  . OSTEOARTHRITIS, HAND 12/31/2009  . [place] DISEASE, CERVICAL 12/31/2009  . [place] DISEASE, LUMBAR 12/31/2009  . Symptomatic sinus bradycardia 01/22/2012  . PAF (paroxysmal atrial fibrillation) ([place]) 01/22/2012  . Sinus node dysfunction ([place]) 01/22/2012  . PVD (peripheral vascular disease), hx stents to bil SFAs 02/2010 01/22/2012  . PAD (peripheral artery disease) ([place])   . Atrial fibrillation with RVR [facility])    Past Surgical History  Procedure Laterality Date  . Cardiac catheterization  2005    "negative" (01/21/2012)  . Oophorectomy  ~1979  . Partial  colectomy  2010  . Carpal tunnel release  2008    "right hand/thumb; carpal tunnel repair; got rid of arthritis" (01/21/2012)  . Insert / replace / remove pacemaker  01/21/2012    initial placement  . Vaginal hysterectomy  1975  . Posterior cervical laminectomy  1985  . Peripheral arterial stent graft  2012; 2012    "LLE; RLE" (01/21/2012)  . Facelift, lower 2/3  1995    "mini" (01/21/2012)  . Augmentation mammaplasty  1983  . Pacemaker insertion  01/21/2012    Medtronic Adapta, model# ADDRL1, serial# [id number]  . Lower arterial examination  10/28/2011    R. SFA stent mild-moderate mixed density plaque with elevated velocities consistent with 50% diameter reduction. L. SFA stent moderate mixed denisty plaque at mid to distal level consistent with 50-69% diameter reduction.  . Cardiac catheterization  09/17/1999    Mild CAD involving proximal portion of LAD. Mitral valve prolapse w/ mitral regurg. No evidence of renal artery stenosis. Recommended readjustment of antihypertensive medications.  . Persantine myoview stress test  02/25/2010    No scintigraphic evidence of inducible myocardial ischemia. No persantine EKG changes. Non-diagnositc for ishcemia. Low-risk, normal scan.  Marland Kitchen Permanent pacemaker insertion N/A 01/21/2012    Procedure: PERMANENT PACEMAKER INSERTION;  Surgeon: Sanda Klein, MD;  Location: [place] CATH LAB;  Service: Cardiovascular;  Laterality: N/A;  . Cardiac catheterization N/A 01/01/2015    Procedure: Left Heart Cath and Coronary Angiography;  Surgeon: Jettie Booze, MD;  Location: [place] CV LAB;  Service: Cardiovascular;  Laterality: N/A;  . Coronary  artery bypass graft N/A 01/04/2015    Procedure: CORONARY ARTERY BYPASS GRAFTING (CABG) x 3 using left internal mammory artery and greater saphenous vein right leg harvested endoscopically.;  Surgeon: Grace Isaac, MD;  LIMA-LAD, SVG-RI, SVG-PDA  . Tee without cardioversion N/A 01/04/2015    Procedure:  TRANSESOPHAGEAL ECHOCARDIOGRAM (TEE);  Surgeon: Grace Isaac, MD;  Location: [place];  Service: Open Heart Surgery;  Laterality: N/A;  . Clipping of atrial appendage N/A 01/04/2015    Procedure: CLIPPING OF ATRIAL APPENDAGE;  Surgeon: Grace Isaac, MD;  Location: [place];  Service: Open Heart Surgery;  Laterality: N/A;  . Cardioversion N/A 02/01/2015    Procedure: CARDIOVERSION;  Surgeon: Lelon Perla, MD;  Location: [facility] ENDOSCOPY;  Service: Cardiovascular;  Laterality: N/A;  . Tee without cardioversion N/A 02/01/2015    Procedure: TRANSESOPHAGEAL ECHOCARDIOGRAM (TEE);  Surgeon: Lelon Perla, MD;  Location: [facility] ENDOSCOPY;  Service: Cardiovascular;  Laterality: N/A;  . Cardiac surgery      11/20/20116   Family History  Problem Relation Age of Onset  . Heart disease Brother     3 brothers with heart disease (2 also with CVA)  . Hypertension Brother   . Heart disease Mother   . Hypertension Mother   . Stroke Mother   . Stroke Father   . Hypertension Brother   . Stroke Brother    Social History  Substance Use Topics  . Smoking status: Former Smoker -- 0.75 packs/day for 10 years    Types: Cigarettes  . Smokeless tobacco: Never Used     Comment: 01/21/2012 "quit smoking ~ 2002"  . Alcohol Use: 9.0 oz/week    15 Shots of liquor per week     Comment: 01/21/2012 "3 times/wk I have a couple mixed drinks"   OB History    No data available     Review of Systems  Constitutional: Positive for fatigue. Negative for activity change.  Respiratory: Positive for shortness of breath.   Cardiovascular: Positive for chest pain.  Gastrointestinal: Negative for abdominal pain.      Allergies  Clonidine derivatives; Plavix; and Exforge  Home Medications   Prior to Admission medications   Medication Sig Start Date End Date Taking? Authorizing Provider  aspirin 81 MG EC tablet Take 81 mg by mouth daily.    Yes Historical Provider, MD  atorvastatin (LIPITOR) 20 MG tablet Take 1  tablet (20 mg total) by mouth daily at 6 PM. 02/13/15  Yes Rhonda G Barrett, PA-C  cholecalciferol (VITAMIN D) 1000 UNITS tablet Take 1,000 Units by mouth daily.   Yes Historical Provider, MD  citalopram (CELEXA) 20 MG tablet Take 1 tablet (20 mg total) by mouth daily. 12/05/12  Yes Brett Canales, PA-C  cyanocobalamin 500 MCG tablet Take 1 tablet (500 mcg total) by mouth daily. Patient taking differently: Take 1,000 mcg by mouth daily.  08/08/14  Yes Robyn Haber, MD  metoprolol tartrate (LOPRESSOR) 25 MG tablet Take 1 tablet (25 mg total) by mouth 2 (two) times daily. 02/13/15  Yes Rhonda G Barrett, PA-C  vitamin E 400 UNIT capsule Take 400 Units by mouth daily.   Yes Historical Provider, MD   BP 167/79 mmHg  Pulse 75  Temp(Src) 98.2 F (36.8 C) (Oral)  Resp 18  SpO2 96% Physical Exam  Constitutional: She is oriented to person, place, and time. She appears well-developed and well-nourished.  HENT:  Head: Normocephalic and atraumatic.  Eyes: Conjunctivae are normal. Right eye exhibits no discharge.  Neck: Neck supple.  Cardiovascular: Normal rate, regular rhythm and normal heart sounds.   No murmur heard. Pulmonary/Chest: Effort normal and breath sounds normal. She has no wheezes. She has no rales.  Midline scar to chest wall.  Abdominal: Soft. She exhibits no distension. There is no tenderness.  Musculoskeletal: Normal range of motion. She exhibits no edema.  Neurological: She is oriented to person, place, and time. No cranial nerve deficit.  Skin: Skin is warm and dry. No rash noted. She is not diaphoretic.  Psychiatric: She has a normal mood and affect. Her behavior is normal.  Nursing note and vitals reviewed.   ED Course  Procedures (including critical care time) Labs Review Labs Reviewed  BASIC METABOLIC PANEL - Abnormal; Notable for the following:    Glucose, Bld 104 (*)    Calcium 10.4 (*)    All other components within normal limits  I-STAT TROPOININ, ED - Abnormal;  Notable for the following:    Troponin i, poc 0.14 (*)    All other components within normal limits  CBC    Imaging Review Dg Chest 2 View  05/11/2015  CLINICAL DATA:  Chest pain 2 days radiating to back and right arm. EXAM: CHEST  2 VIEW COMPARISON:  04/02/2015 FINDINGS: Left-sided pacemaker and sternotomy wires unchanged. Lungs are adequately inflated without consolidation or effusion. Mild stable cardiomegaly. Degenerate change of the spine. IMPRESSION: No active cardiopulmonary disease. Stable cardiomegaly. Electronically Signed   By: Marin Olp M.D.   On: 05/11/2015 21:19   I have personally reviewed and evaluated these images and lab results as part of my medical decision-making.   EKG Interpretation   Date/Time:  Saturday May 11 2015 21:32:48 EDT Ventricular Rate:  76 PR Interval:  205 QRS Duration: 112 QT Interval:  382 QTC Calculation: 429 R Axis:   86 Text Interpretation:  Age not entered, assumed to be  75 years old for  purpose of ECG interpretation Sinus rhythm Borderline intraventricular  conduction delay Repol abnrm suggests ischemia, diffuse leads ST  flattentinig in lateral leads.  Confirmed by Gerald Leitz ([id number]) on  05/11/2015 9:42:15 PM      MDM   Final diagnoses:  None   Patient is a 75 year old female with extensive cardiac history. Patient had triple bypass CABG done in November. In December she had a cardioverted out of A. fib. Patient has pacemaker. Patient has intermittent chest pain and heaviness for the last 2-3 days. It radiates down both arms. Concerning for ischemia.  Patient has elevated troponin. EKG is paced.  Cardiology paged.  CRITICAL CARE Performed by: Gardiner Sleeper Total critical care time: 30 minutes Critical care time was exclusive of separately billable procedures and treating other patients. Critical care was necessary to treat or prevent imminent or life-threatening deterioration. Critical care was time spent  personally by me on the following activities: development of treatment plan with patient and/or surrogate as well as nursing, discussions with consultants, evaluation of patient's response to treatment, examination of patient, obtaining history from patient or surrogate, ordering and performing treatments and interventions, ordering and review of laboratory studies, ordering and review of radiographic studies, pulse oximetry and re-evaluation of patient's condition.     Sharra Cayabyab Julio Alm, MD 05/11/15 2209

## 2015-05-11 NOTE — ED Notes (Signed)
Notified by phlebotomy that pt has an elevated troponin. Notified Dr. Thomasene Lot.

## 2015-05-11 NOTE — ED Notes (Signed)
Patient transported to CT 

## 2015-05-11 NOTE — ED Notes (Signed)
IV team RN at the bedside.

## 2015-05-12 ENCOUNTER — Encounter (HOSPITAL_COMMUNITY)
Admission: EM | Disposition: A | Discharge status: Home / Self Care | Payer: Self-pay | Source: Home / Self Care | Attending: Cardiovascular Disease

## 2015-05-12 ENCOUNTER — Other Ambulatory Visit: Payer: Self-pay | Admitting: Cardiology

## 2015-05-12 DIAGNOSIS — I25710 Atherosclerosis of autologous vein coronary artery bypass graft(s) with unstable angina pectoris: Secondary | ICD-10-CM

## 2015-05-12 DIAGNOSIS — R079 Chest pain, unspecified: Secondary | ICD-10-CM

## 2015-05-12 DIAGNOSIS — I480 Paroxysmal atrial fibrillation: Secondary | ICD-10-CM

## 2015-05-12 HISTORY — PX: CARDIAC CATHETERIZATION: SHX172

## 2015-05-12 LAB — CBC
HCT: 38.7 % (ref 36.0–46.0)
Hemoglobin: 12.3 g/dL (ref 12.0–15.0)
MCH: 27.8 pg (ref 26.0–34.0)
MCHC: 31.8 g/dL (ref 30.0–36.0)
MCV: 87.6 fL (ref 78.0–100.0)
Platelets: 195 10*3/uL (ref 150–400)
RBC: 4.42 MIL/uL (ref 3.87–5.11)
RDW: 14.6 % (ref 11.5–15.5)
WBC: 4.7 10*3/uL (ref 4.0–10.5)

## 2015-05-12 LAB — COMPREHENSIVE METABOLIC PANEL
ALT: 23 U/L (ref 14–54)
AST: 30 U/L (ref 15–41)
Albumin: 3.7 g/dL (ref 3.5–5.0)
Alkaline Phosphatase: 99 U/L (ref 38–126)
Anion gap: 9 (ref 5–15)
BUN: 12 mg/dL (ref 6–20)
CO2: 25 mmol/L (ref 22–32)
Calcium: 9.9 mg/dL (ref 8.9–10.3)
Chloride: 104 mmol/L (ref 101–111)
Creatinine, Ser: 0.82 mg/dL (ref 0.44–1.00)
GFR calc Af Amer: >60 mL/min (ref >60)
GFR calc non Af Amer: >60 mL/min (ref >60)
Glucose, Bld: 127 mg/dL — ABNORMAL HIGH (ref 65–99)
Potassium: 3.9 mmol/L (ref 3.5–5.1)
Sodium: 138 mmol/L (ref 135–145)
Total Bilirubin: 0.8 mg/dL (ref 0.3–1.2)
Total Protein: 6.3 g/dL — ABNORMAL LOW (ref 6.5–8.1)

## 2015-05-12 LAB — CBC WITH DIFFERENTIAL/PLATELET
Basophils Absolute: 0 10*3/uL (ref 0.0–0.1)
Basophils Relative: 0 %
Eosinophils Absolute: 0.2 10*3/uL (ref 0.0–0.7)
Eosinophils Relative: 3 %
HCT: 41.1 % (ref 36.0–46.0)
Hemoglobin: 13.2 g/dL (ref 12.0–15.0)
Lymphocytes Relative: 31 %
Lymphs Abs: 1.7 10*3/uL (ref 0.7–4.0)
MCH: 28 pg (ref 26.0–34.0)
MCHC: 32.1 g/dL (ref 30.0–36.0)
MCV: 87.3 fL (ref 78.0–100.0)
Monocytes Absolute: 0.3 10*3/uL (ref 0.1–1.0)
Monocytes Relative: 6 %
Neutro Abs: 3.2 10*3/uL (ref 1.7–7.7)
Neutrophils Relative %: 59 %
Platelets: 205 10*3/uL (ref 150–400)
RBC: 4.71 MIL/uL (ref 3.87–5.11)
RDW: 14.3 % (ref 11.5–15.5)
WBC: 5.3 10*3/uL (ref 4.0–10.5)

## 2015-05-12 LAB — TROPONIN I
Troponin I: 0.28 ng/mL — ABNORMAL HIGH (ref <0.031)
Troponin I: 0.24 ng/mL — ABNORMAL HIGH (ref <0.031)
Troponin I: 0.3 ng/mL — ABNORMAL HIGH (ref <0.031)
Troponin I: 0.59 ng/mL (ref <0.031)

## 2015-05-12 LAB — LIPID PANEL
Cholesterol: 158 mg/dL (ref 0–200)
HDL: 64 mg/dL (ref >40)
LDL Cholesterol: 71 mg/dL (ref 0–99)
Total CHOL/HDL Ratio: 2.5 RATIO
Triglycerides: 114 mg/dL (ref <150)
VLDL: 23 mg/dL (ref 0–40)

## 2015-05-12 LAB — BASIC METABOLIC PANEL
Anion gap: 8 (ref 5–15)
BUN: 12 mg/dL (ref 6–20)
CO2: 23 mmol/L (ref 22–32)
Calcium: 9.5 mg/dL (ref 8.9–10.3)
Chloride: 105 mmol/L (ref 101–111)
Creatinine, Ser: 0.77 mg/dL (ref 0.44–1.00)
GFR calc Af Amer: >60 mL/min (ref >60)
GFR calc non Af Amer: >60 mL/min (ref >60)
Glucose, Bld: 121 mg/dL — ABNORMAL HIGH (ref 65–99)
Potassium: 4.3 mmol/L (ref 3.5–5.1)
Sodium: 136 mmol/L (ref 135–145)

## 2015-05-12 LAB — BRAIN NATRIURETIC PEPTIDE: B Natriuretic Peptide: 605.8 pg/mL — ABNORMAL HIGH (ref 0.0–100.0)

## 2015-05-12 LAB — MAGNESIUM: Magnesium: 1.9 mg/dL (ref 1.7–2.4)

## 2015-05-12 LAB — TSH: TSH: 5.39 u[IU]/mL — ABNORMAL HIGH (ref 0.350–4.500)

## 2015-05-12 LAB — PROTIME-INR
INR: 1.01 (ref 0.00–1.49)
INR: 1.08 (ref 0.00–1.49)
Prothrombin Time: 13.5 seconds (ref 11.6–15.2)
Prothrombin Time: 14.2 seconds (ref 11.6–15.2)

## 2015-05-12 LAB — HEPARIN LEVEL (UNFRACTIONATED): Heparin Unfractionated: 0.34 IU/mL (ref 0.30–0.70)

## 2015-05-12 LAB — APTT: aPTT: 24 seconds (ref 24–37)

## 2015-05-12 SURGERY — LEFT HEART CATH AND CORONARY ANGIOGRAPHY
Anesthesia: LOCAL

## 2015-05-12 MED ORDER — IOPAMIDOL (ISOVUE-370) INJECTION 76%
INTRAVENOUS | Status: AC
  Filled 2015-05-12: qty 50

## 2015-05-12 MED ORDER — ASPIRIN 81 MG PO CHEW: 81.0000 mg | CHEWABLE_TABLET | ORAL | Status: DC

## 2015-05-12 MED ORDER — NITROGLYCERIN IN D5W 200-5 MCG/ML-% IV SOLN: 0.0000 ug/min | INTRAVENOUS | Status: DC

## 2015-05-12 MED ORDER — LIDOCAINE HCL (PF) 1 % IJ SOLN
INTRAMUSCULAR | Status: AC
  Filled 2015-05-12: qty 30

## 2015-05-12 MED ORDER — IOPAMIDOL (ISOVUE-370) INJECTION 76%
INTRAVENOUS | Status: DC | PRN
  Administered 2015-05-12: 255 mL via INTRAVENOUS

## 2015-05-12 MED ORDER — SODIUM CHLORIDE 0.9% FLUSH: 3.0000 mL | INTRAVENOUS | Status: DC | PRN

## 2015-05-12 MED ORDER — ONDANSETRON HCL 4 MG/2ML IJ SOLN: 4.0000 mg | Freq: Four times a day (QID) | INTRAMUSCULAR | Status: DC | PRN

## 2015-05-12 MED ORDER — SODIUM CHLORIDE 0.9 % IV SOLN: INTRAVENOUS | Status: DC

## 2015-05-12 MED ORDER — ACETAMINOPHEN 325 MG PO TABS: 650.0000 mg | ORAL_TABLET | ORAL | Status: DC | PRN

## 2015-05-12 MED ORDER — ASPIRIN 81 MG PO CHEW
324.0000 mg | CHEWABLE_TABLET | ORAL | Status: AC
  Administered 2015-05-12: 324 mg via ORAL
  Filled 2015-05-12: qty 4

## 2015-05-12 MED ORDER — TICAGRELOR 90 MG PO TABS
ORAL_TABLET | ORAL | Status: AC
  Filled 2015-05-12: qty 2

## 2015-05-12 MED ORDER — ASPIRIN EC 81 MG PO TBEC
81.0000 mg | DELAYED_RELEASE_TABLET | Freq: Every day | ORAL | Status: DC
  Administered 2015-05-12 – 2015-05-15 (×4): 81 mg via ORAL
  Filled 2015-05-12 (×4): qty 1

## 2015-05-12 MED ORDER — IOPAMIDOL (ISOVUE-370) INJECTION 76%
INTRAVENOUS | Status: AC
  Filled 2015-05-12: qty 125

## 2015-05-12 MED ORDER — SODIUM CHLORIDE 0.9 % IV SOLN: 250.0000 mL | INTRAVENOUS | Status: DC | PRN

## 2015-05-12 MED ORDER — ACETAMINOPHEN 325 MG PO TABS
650.0000 mg | ORAL_TABLET | ORAL | Status: DC | PRN
  Administered 2015-05-12 – 2015-05-13 (×2): 650 mg via ORAL
  Filled 2015-05-12 (×3): qty 2

## 2015-05-12 MED ORDER — HEPARIN (PORCINE) IN NACL 2-0.9 UNIT/ML-% IJ SOLN
INTRAMUSCULAR | Status: AC
  Filled 2015-05-12: qty 1000

## 2015-05-12 MED ORDER — MORPHINE SULFATE (PF) 2 MG/ML IV SOLN
2.0000 mg | INTRAVENOUS | Status: DC | PRN
  Administered 2015-05-12: 2 mg via INTRAVENOUS

## 2015-05-12 MED ORDER — HEPARIN BOLUS VIA INFUSION
3000.0000 [IU] | Freq: Once | INTRAVENOUS | Status: AC
  Administered 2015-05-12: 3000 [IU] via INTRAVENOUS
  Filled 2015-05-12: qty 3000

## 2015-05-12 MED ORDER — ASPIRIN 81 MG PO CHEW: 81.0000 mg | CHEWABLE_TABLET | Freq: Every day | ORAL | Status: DC

## 2015-05-12 MED ORDER — ASPIRIN 300 MG RE SUPP: 300.0000 mg | RECTAL | Status: AC

## 2015-05-12 MED ORDER — TICAGRELOR 90 MG PO TABS
ORAL_TABLET | ORAL | Status: DC | PRN
  Administered 2015-05-12: 180 mg via ORAL

## 2015-05-12 MED ORDER — SODIUM CHLORIDE 0.9% FLUSH
3.0000 mL | Freq: Two times a day (BID) | INTRAVENOUS | Status: DC
  Administered 2015-05-12: 3 mL via INTRAVENOUS

## 2015-05-12 MED ORDER — TICAGRELOR 90 MG PO TABS
90.0000 mg | ORAL_TABLET | Freq: Two times a day (BID) | ORAL | Status: DC
  Administered 2015-05-13 – 2015-05-15 (×5): 90 mg via ORAL
  Filled 2015-05-12 (×5): qty 1

## 2015-05-12 MED ORDER — SODIUM CHLORIDE 0.9% FLUSH
3.0000 mL | Freq: Two times a day (BID) | INTRAVENOUS | Status: DC
  Administered 2015-05-13 – 2015-05-15 (×5): 3 mL via INTRAVENOUS

## 2015-05-12 MED ORDER — SODIUM CHLORIDE 0.9 % IV SOLN
250.0000 mg | INTRAVENOUS | Status: DC | PRN
  Administered 2015-05-12: 1.75 mg/kg/h via INTRAVENOUS

## 2015-05-12 MED ORDER — ALPRAZOLAM 0.25 MG PO TABS
0.2500 mg | ORAL_TABLET | Freq: Once | ORAL | Status: AC
  Administered 2015-05-12: 0.25 mg via ORAL
  Filled 2015-05-12: qty 1

## 2015-05-12 MED ORDER — NITROGLYCERIN 0.4 MG SL SUBL: 0.4000 mg | SUBLINGUAL_TABLET | SUBLINGUAL | Status: DC | PRN

## 2015-05-12 MED ORDER — MIDAZOLAM HCL 2 MG/2ML IJ SOLN
INTRAMUSCULAR | Status: AC
  Filled 2015-05-12: qty 2

## 2015-05-12 MED ORDER — BIVALIRUDIN BOLUS VIA INFUSION - CUPID
INTRAVENOUS | Status: DC | PRN
  Administered 2015-05-12: 47.475 mg via INTRAVENOUS

## 2015-05-12 MED ORDER — MORPHINE SULFATE (PF) 2 MG/ML IV SOLN
INTRAVENOUS | Status: AC
  Filled 2015-05-12: qty 1

## 2015-05-12 MED ORDER — ATORVASTATIN CALCIUM 80 MG PO TABS: 80.0000 mg | ORAL_TABLET | Freq: Every day | ORAL | Status: DC

## 2015-05-12 MED ORDER — MIDAZOLAM HCL 2 MG/2ML IJ SOLN
INTRAMUSCULAR | Status: DC | PRN
  Administered 2015-05-12: 2 mg via INTRAVENOUS

## 2015-05-12 MED ORDER — BIVALIRUDIN 250 MG IV SOLR
INTRAVENOUS | Status: AC
  Filled 2015-05-12: qty 250

## 2015-05-12 MED ORDER — ATORVASTATIN CALCIUM 20 MG PO TABS
20.0000 mg | ORAL_TABLET | Freq: Every day | ORAL | Status: DC
  Administered 2015-05-12: 20 mg via ORAL
  Filled 2015-05-12: qty 1

## 2015-05-12 MED ORDER — CITALOPRAM HYDROBROMIDE 20 MG PO TABS
20.0000 mg | ORAL_TABLET | Freq: Every day | ORAL | Status: DC
  Administered 2015-05-12 – 2015-05-15 (×4): 20 mg via ORAL
  Filled 2015-05-12 (×4): qty 1

## 2015-05-12 MED ORDER — LIDOCAINE HCL (PF) 1 % IJ SOLN
INTRAMUSCULAR | Status: DC | PRN
  Administered 2015-05-12: 15 mL

## 2015-05-12 MED ORDER — ATORVASTATIN CALCIUM 80 MG PO TABS
80.0000 mg | ORAL_TABLET | Freq: Every day | ORAL | Status: DC
  Administered 2015-05-13 – 2015-05-14 (×2): 80 mg via ORAL
  Filled 2015-05-12 (×2): qty 1

## 2015-05-12 MED ORDER — HEPARIN (PORCINE) IN NACL 2-0.9 UNIT/ML-% IJ SOLN
INTRAMUSCULAR | Status: DC | PRN
  Administered 2015-05-12: 1000 mL

## 2015-05-12 MED ORDER — NITROGLYCERIN 1 MG/10 ML FOR IR/CATH LAB
INTRA_ARTERIAL | Status: DC | PRN
  Administered 2015-05-12 (×4): 200 ug via INTRACORONARY

## 2015-05-12 MED ORDER — HEPARIN (PORCINE) IN NACL 100-0.45 UNIT/ML-% IJ SOLN: 850.0000 [IU]/h | INTRAMUSCULAR | Status: DC

## 2015-05-12 MED ORDER — FENTANYL CITRATE (PF) 100 MCG/2ML IJ SOLN
INTRAMUSCULAR | Status: AC
  Filled 2015-05-12: qty 2

## 2015-05-12 MED ORDER — SODIUM CHLORIDE 0.9 % WEIGHT BASED INFUSION: 1.0000 mL/kg/h | INTRAVENOUS | Status: DC

## 2015-05-12 MED ORDER — HEPARIN SODIUM (PORCINE) 5000 UNIT/ML IJ SOLN
5000.0000 [IU] | Freq: Three times a day (TID) | INTRAMUSCULAR | Status: DC
  Administered 2015-05-13 – 2015-05-15 (×7): 5000 [IU] via SUBCUTANEOUS
  Filled 2015-05-12 (×7): qty 1

## 2015-05-12 MED ORDER — FENTANYL CITRATE (PF) 100 MCG/2ML IJ SOLN
INTRAMUSCULAR | Status: DC | PRN
  Administered 2015-05-12: 50 ug via INTRAVENOUS

## 2015-05-12 MED ORDER — IOPAMIDOL (ISOVUE-370) INJECTION 76%
INTRAVENOUS | Status: AC
  Filled 2015-05-12: qty 100

## 2015-05-12 MED ORDER — ISOSORBIDE MONONITRATE ER 30 MG PO TB24
30.0000 mg | ORAL_TABLET | Freq: Every day | ORAL | Status: DC
  Administered 2015-05-13 – 2015-05-15 (×3): 30 mg via ORAL
  Filled 2015-05-12 (×3): qty 1

## 2015-05-12 MED ORDER — MORPHINE SULFATE (PF) 2 MG/ML IV SOLN
2.0000 mg | INTRAVENOUS | Status: DC | PRN
  Administered 2015-05-12: 2 mg via INTRAVENOUS
  Filled 2015-05-12: qty 1

## 2015-05-12 MED ORDER — NITROGLYCERIN 1 MG/10 ML FOR IR/CATH LAB
INTRA_ARTERIAL | Status: AC
  Filled 2015-05-12: qty 10

## 2015-05-12 MED ORDER — HEPARIN (PORCINE) IN NACL 100-0.45 UNIT/ML-% IJ SOLN
750.0000 [IU]/h | INTRAMUSCULAR | Status: DC
  Administered 2015-05-12: 750 [IU]/h via INTRAVENOUS
  Filled 2015-05-12: qty 250

## 2015-05-12 MED ORDER — METOPROLOL TARTRATE 25 MG PO TABS
25.0000 mg | ORAL_TABLET | Freq: Two times a day (BID) | ORAL | Status: DC
  Administered 2015-05-12 – 2015-05-15 (×8): 25 mg via ORAL
  Filled 2015-05-12 (×8): qty 1

## 2015-05-12 SURGICAL SUPPLY — 23 items
BALLN EUPHORA RX 2.0X15 (BALLOONS) ×2
BALLN EUPHORA RX 2.5X20 (BALLOONS) ×2
BALLN ~~LOC~~ EUPHORA RX 3.0X27 (BALLOONS) ×2
BALLN ~~LOC~~ EUPHORA RX 3.25X15 (BALLOONS) ×2
BALLOON EUPHORA RX 2.0X15 (BALLOONS) ×1 IMPLANT
BALLOON EUPHORA RX 2.5X20 (BALLOONS) ×1 IMPLANT
BALLOON ~~LOC~~ EUPHORA RX 3.0X27 (BALLOONS) ×1 IMPLANT
BALLOON ~~LOC~~ EUPHORA RX 3.25X15 (BALLOONS) ×1 IMPLANT
CATH INFINITI 5 FR IM (CATHETERS) ×2 IMPLANT
CATH INFINITI 5FR MULTPACK ANG (CATHETERS) ×2 IMPLANT
ELECT DEFIB PAD ADLT CADENCE (PAD) ×2 IMPLANT
GUIDE CATH RUNWAY 6FR RCB (CATHETERS) ×2 IMPLANT
KIT ENCORE 26 ADVANTAGE (KITS) ×2 IMPLANT
KIT HEART LEFT (KITS) ×2 IMPLANT
PACK CARDIAC CATHETERIZATION (CUSTOM PROCEDURE TRAY) ×2 IMPLANT
SHEATH PINNACLE 6F 10CM (SHEATH) ×2 IMPLANT
STENT SYNERGY DES 2.75X38 (Permanent Stent) ×2 IMPLANT
STENT SYNERGY DES 3X20 (Permanent Stent) ×2 IMPLANT
SYR MEDRAD MARK V 150ML (SYRINGE) ×2 IMPLANT
TRANSDUCER W/STOPCOCK (MISCELLANEOUS) ×2 IMPLANT
TUBING CIL FLEX 10 FLL-RA (TUBING) ×2 IMPLANT
WIRE ASAHI PROWATER 180CM (WIRE) ×2 IMPLANT
WIRE EMERALD 3MM-J .035X150CM (WIRE) ×2 IMPLANT

## 2015-05-12 NOTE — Interval H&P Note (Signed)
Cath Lab Visit (complete for each Cath Lab visit)  Clinical Evaluation Leading to the Procedure:   ACS: Yes.    Non-ACS:    Anginal Classification: CCS IV  Anti-ischemic medical therapy: Maximal Therapy (2 or more classes of medications)  Non-Invasive Test Results: No non-invasive testing performed  Prior CABG: Previous CABG      History and Physical Interval Note:  05/12/2015 5:23 PM  [name]  has presented today for surgery, with the diagnosis of NSTEMI  The various methods of treatment have been discussed with the patient and family. After consideration of risks, benefits and other options for treatment, the patient has consented to  Procedure(s): Left Heart Cath and Coronary Angiography (N/A) as a surgical intervention .  The patient's history has been reviewed, patient examined, no change in status, stable for surgery.  I have reviewed the patient's chart and labs.  Questions were answered to the patient's satisfaction.     Haik Mahoney A

## 2015-05-12 NOTE — Progress Notes (Signed)
[facility] for Heparin Indication: chest pain/ACS  Allergies  Allergen Reactions  . Clonidine Derivatives Other (See Comments)    Lowers heart rate  . Plavix [Clopidogrel Bisulfate] Other (See Comments)    Joint pains  . Exforge [Amlodipine Besylate-Valsartan] Itching and Rash    Patient Measurements:    Vital Signs: Temp: 98.2 F (36.8 C) (04/01 1958) Temp Source: Oral (04/01 1958) BP: 145/76 mmHg (04/02 0130) Pulse Rate: 74 (04/02 0130)  Labs:  Recent Labs  05/11/15 2019  HGB 13.3  HCT 40.6  PLT 178  CREATININE 0.83    Estimated Creatinine Clearance: 51.7 mL/min (by C-G formula based on Cr of 0.83).   Medical History: Past Medical History  Diagnosis Date  . GLUCOSE INTOLERANCE 12/31/2009  . HYPERLIPIDEMIA 12/31/2009  . ANXIETY 12/31/2009  . DEPRESSION 12/31/2009  . MITRAL REGURGITATION 12/31/2009  . HYPERTENSION 12/31/2009  . CORONARY ARTERY DISEASE 12/31/2009  . MITRAL VALVE PROLAPSE 12/31/2009  . MENOPAUSE, EARLY 12/31/2009  . Unspecified urinary incontinence 12/31/2009  . ABDOMINAL PAIN, LOWER 12/31/2009  . DIVERTICULITIS, HX OF 12/31/2009  . Pacemaker 01/21/2012    MDT Adapta dual chamber  . Tachycardia-bradycardia syndrome ([place])     [name] 01/21/2012  . PAD (peripheral artery disease) ([place])   . Heart murmur     "used to say I did" (01/21/2012)  . OSTEOARTHRITIS, HAND 12/31/2009  . [place] DISEASE, CERVICAL 12/31/2009  . [place] DISEASE, LUMBAR 12/31/2009  . Symptomatic sinus bradycardia 01/22/2012  . PAF (paroxysmal atrial fibrillation) ([place]) 01/22/2012  . Sinus node dysfunction ([place]) 01/22/2012  . PVD (peripheral vascular disease), hx stents to bil SFAs 02/2010 01/22/2012  . PAD (peripheral artery disease) ([place])   . Atrial fibrillation with RVR ([place])   . NSTEMI (non-ST elevated myocardial infarction) ([place]) 05/11/2015    Medications:  Prescriptions prior to admission  Medication Sig Dispense Refill Last Dose  .  aspirin 81 MG EC tablet Take 81 mg by mouth daily.    05/11/2015 at Unknown time  . atorvastatin (LIPITOR) 20 MG tablet Take 1 tablet (20 mg total) by mouth daily at 6 PM. 90 tablet 1 05/11/2015 at Unknown time  . cholecalciferol (VITAMIN D) 1000 UNITS tablet Take 1,000 Units by mouth daily.   05/11/2015 at Unknown time  . citalopram (CELEXA) 20 MG tablet Take 1 tablet (20 mg total) by mouth daily. 90 tablet 0 05/11/2015 at Unknown time  . cyanocobalamin 500 MCG tablet Take 1 tablet (500 mcg total) by mouth daily. (Patient taking differently: Take 1,000 mcg by mouth daily. ) 90 tablet 3 05/11/2015 at Unknown time  . metoprolol tartrate (LOPRESSOR) 25 MG tablet Take 1 tablet (25 mg total) by mouth 2 (two) times daily. 180 tablet 1 05/11/2015 at 1200  . vitamin E 400 UNIT capsule Take 400 Units by mouth daily.   05/11/2015 at Unknown time    Assessment: 75 y.o. female with chest pain for heparin  Goal of Therapy:  Heparin level 0.3-0.7 units/ml Monitor platelets by anticoagulation protocol: Yes   Plan:  Heparin 3000 units IV bolus, then start heparin 750 units/hr Check heparin level in 8 hours.   Caryl Pina 05/12/2015,2:42 AM

## 2015-05-12 NOTE — Progress Notes (Signed)
Called by RN- pt had SSCP "10/10" with diaphoresis. She is better now "3/10" after NTG increased and 2 mg of MSO4. EKG shows Paced rhythm. Will discuss with Dr Claiborne Billings- consider cath tonight.  Kerin Ransom PA-C 05/12/2015 3:58 PM

## 2015-05-12 NOTE — Progress Notes (Signed)
Attempted to get report. 

## 2015-05-12 NOTE — Progress Notes (Signed)
[facility] for Heparin Indication: chest pain/ACS  Allergies  Allergen Reactions  . Clonidine Derivatives Other (See Comments)    Lowers heart rate  . Plavix [Clopidogrel Bisulfate] Other (See Comments)    Joint pains  . Exforge [Amlodipine Besylate-Valsartan] Itching and Rash    Patient Measurements: Height: 5\' 4"  (162.6 cm) Weight: 139 lb 8 oz (63.277 kg) IBW/kg (Calculated) : 54.7  Vital Signs: Temp: 98.1 F (36.7 C) (04/02 0625) Temp Source: Oral (04/02 0625) BP: 128/68 mmHg (04/02 0729) Pulse Rate: 75 (04/02 0729)  Labs:  Recent Labs  05/11/15 2019 05/12/15 0324 05/12/15 0905  HGB 13.3 13.2 12.3  HCT 40.6 41.1 38.7  PLT 178 205 195  APTT  --  24  --   LABPROT  --  13.5 14.2  INR  --  1.01 1.08  HEPARINUNFRC  --   --  0.34  CREATININE 0.83 0.82 0.77  TROPONINI  --  0.28* 0.24*    Estimated Creatinine Clearance: 52.5 mL/min (by C-G formula based on Cr of 0.77).   Medical History: Past Medical History  Diagnosis Date  . GLUCOSE INTOLERANCE 12/31/2009  . HYPERLIPIDEMIA 12/31/2009  . ANXIETY 12/31/2009  . DEPRESSION 12/31/2009  . MITRAL REGURGITATION 12/31/2009  . HYPERTENSION 12/31/2009  . CORONARY ARTERY DISEASE 12/31/2009  . MITRAL VALVE PROLAPSE 12/31/2009  . MENOPAUSE, EARLY 12/31/2009  . Unspecified urinary incontinence 12/31/2009  . ABDOMINAL PAIN, LOWER 12/31/2009  . DIVERTICULITIS, HX OF 12/31/2009  . Pacemaker 01/21/2012    MDT Adapta dual chamber  . Tachycardia-bradycardia syndrome ([place])     [name] 01/21/2012  . PAD (peripheral artery disease) ([place])   . Heart murmur     "used to say I did" (01/21/2012)  . OSTEOARTHRITIS, HAND 12/31/2009  . [place] DISEASE, CERVICAL 12/31/2009  . [place] DISEASE, LUMBAR 12/31/2009  . Symptomatic sinus bradycardia 01/22/2012  . PAF (paroxysmal atrial fibrillation) ([place]) 01/22/2012  . Sinus node dysfunction ([place]) 01/22/2012  . PVD (peripheral vascular disease), hx stents to  bil SFAs 02/2010 01/22/2012  . PAD (peripheral artery disease) ([place])   . Atrial fibrillation with RVR ([place])   . NSTEMI (non-ST elevated myocardial infarction) ([place]) 05/11/2015    Medications:  Prescriptions prior to admission  Medication Sig Dispense Refill Last Dose  . aspirin 81 MG EC tablet Take 81 mg by mouth daily.    05/11/2015 at Unknown time  . atorvastatin (LIPITOR) 20 MG tablet Take 1 tablet (20 mg total) by mouth daily at 6 PM. 90 tablet 1 05/11/2015 at Unknown time  . cholecalciferol (VITAMIN D) 1000 UNITS tablet Take 1,000 Units by mouth daily.   05/11/2015 at Unknown time  . citalopram (CELEXA) 20 MG tablet Take 1 tablet (20 mg total) by mouth daily. 90 tablet 0 05/11/2015 at Unknown time  . cyanocobalamin 500 MCG tablet Take 1 tablet (500 mcg total) by mouth daily. (Patient taking differently: Take 1,000 mcg by mouth daily. ) 90 tablet 3 05/11/2015 at Unknown time  . metoprolol tartrate (LOPRESSOR) 25 MG tablet Take 1 tablet (25 mg total) by mouth 2 (two) times daily. 180 tablet 1 05/11/2015 at 1200  . vitamin E 400 UNIT capsule Take 400 Units by mouth daily.   05/11/2015 at Unknown time    Assessment: 75 y.o. female admitted 05/11/2015 for NSTEMI with increasing CP x 3 days with exertion similar to CABG 4 mos ago.Trop to 0.14, BP 180/113 prior to presentation.   PMH ASCAD s/p CABG x3 (01/2015), PAD s/p stenting to  femoral arteries, HTN, HLD, AFib s/p LA clipping, tachybrady syndrome s/p PPM (2013)  HL 0.24, subtherapeutic on heparin 750 units/h. CBC wnl and stable. No noted bleeding.   Goal of Therapy:  Heparin level 0.3-0.7 units/ml Monitor platelets by anticoagulation protocol: Yes   Plan:  Increase to heparin 800 units/hr 1930 HL Daily HL, CBC Monitor s/sx bleeding LHC Monday AM   [name] 05/12/2015,10:51 AM

## 2015-05-12 NOTE — Progress Notes (Addendum)
Interventional Consult Note: I was asked by  Lurena Joiner Kilroy/Dr. Bensimohn to evaluate this patient for possible urgent cardiac catheterization today.  The patient is a 75 year old female who underwent CABG revascularization surgery 4 months ago and had a LIMA placed to LAD, SVG to the PDA, SVG to the intermediate branch and also had a 35 mm atrial clip inserted done by Dr. Servando Snare.  She had severe multivessel CAD.  She has a history of peripheral vascular disease and previously had received stents to the SFA, and has a history of hypertension and hyperlipidemia.  She has a history of atrial fibrillation and underwent cardioversion on December 22 and had been on anticoagulation therapy, but due to continued bruising this ultimately was discontinued.  Patient was admitted last evening by Dr. Radford Pax with recurrent unstable angina symptoms that had been progressive over the past 3 days.  She was significantly hypertensive with a blood pressure 180/113.  Cardiac enzymes are positive with mildly positive troponins.  This morning while on nitroglycerin and heparin she developed recurrent anginal symptoms requiring dose titration.  This afternoon, she experienced 10 out of 10 chest tightness associated with diaphoresis and shortness of breath that was very concerning to the nurse who  felt as though she was becoming more unstable.  She was seen by Kerin Ransom and still had residual 3/10 chest pain despite increasing her nitroglycerin to 35 g and been given morphine sulfate.  She has an atrially paced rhythm with lateral ST segment changes.  I have seen and examined the patient.  Her blood pressure is 146/74.  Pulse is 75.  Sh is afebrile.  SHEENT is unremarkable.  There are no carotid bruits.  Her lungs were clear.  Her sternotomy scar was well-healed.  Rhythm was regular.  There was no ectopy.  Her abdomen was soft, nontender , and she was without hepatosplenomegaly.  Femoral pulses were adequate. Distal pulses are mildly  diminished.  There was no clubbing cyanosis or edema.  Neurologic exam was nonfocal. I have discussed the recurrent symptoms with her nurse, the patient and her husband.  With her progressive unstable symptoms , which occurred on increasing doses of IV nitroglycerin and heparin I have recommended urgent cardiac catheterization be performed today rather than, as is electively scheduled tomorrow.  The patient is aware of the risks benefits of the procedure and wishes to pursue this urgent catheterization today.  Troy Sine, MD  05/12/2015  4:59 PM

## 2015-05-12 NOTE — Progress Notes (Signed)
Pt called out in 10/10 chest pain. When I went into the room, pt diaphoretic, very restless and can't stop moving in her bed. She is also complaining of right arm pain also with some minor shortness of breath. Oxygen applied at 4l [place], blood pressure in the 170s/90s, pt a pace in the 70s. IV nitro increased to 6cc/hr with relief only to a 9/10. Blood pressure still elevated, IV nitro increased to 9cc. EKG done and Kerin Ransom, [place] notified and looked at EKG. He stated that MD would come to see her shortly. Orders for Morphine received and gave 2mg  as ordered. Pain only down to 7/10 midsternal and right shoulder/back. Every little bit, pts chest pain easing off. Please see vital signs

## 2015-05-12 NOTE — Progress Notes (Addendum)
    Subjective:  Denies dyspnea; chest pain improved but persists   Objective:  Filed Vitals:   05/12/15 0130 05/12/15 0322 05/12/15 0625 05/12/15 0729  BP: 145/76 178/96 138/79 128/68  Pulse: 74 78 76 75  Temp:  97.6 F (36.4 C) 98.1 F (36.7 C)   TempSrc:  Oral Oral   Resp: 18 18 18    Height:  5\' 4"  (1.626 m)    Weight:  139 lb 8 oz (63.277 kg)    SpO2: 95% 97% 94%     Intake/Output from previous day: No intake or output data in the 24 hours ending 05/12/15 0850  Physical Exam: Physical exam: Well-developed well-nourished in no acute distress.  Skin is warm and dry.  HEENT is normal.  Neck is supple.  Chest is clear to auscultation with normal expansion.  Cardiovascular exam is regular rate and rhythm.  Abdominal exam nontender or distended. No masses palpated. Extremities show no edema. neuro grossly intact    Lab Results: Basic Metabolic Panel:  Recent Labs  05/11/15 2019 05/12/15 0324  NA 137 138  K 4.9 3.9  CL 105 104  CO2 25 25  GLUCOSE 104* 127*  BUN 17 12  CREATININE 0.83 0.82  CALCIUM 10.4* 9.9  MG  --  1.9   CBC:  Recent Labs  05/11/15 2019 05/12/15 0324  WBC 5.9 5.3  NEUTROABS  --  3.2  HGB 13.3 13.2  HCT 40.6 41.1  MCV 87.7 87.3  PLT 178 205   Cardiac Enzymes:  Recent Labs  05/12/15 0324  TROPONINI 0.28*     Assessment/Plan:  1 chest pain-symptoms are somewhat atypical. They have been present since Thursday. Electrocardiogram shows atrial pacing with nonspecific ST changes. Troponin minimally elevated but no clear trend at this point. We will continue to cycle enzymes. Note CT showed no aortic dissection. She is very concerned about her symptoms. We will plan cardiac catheterization tomorrow morning assuming her renal function is stable. The risks and benefits including myocardial infarction, CVA and death were discussed and she agrees to proceed. Continue aspirin, heparin, statin and beta blocker. Continue IV  nitroglycerin. 2 hypertension-blood pressure has improved this morning. Continue present medications and follow. 3 hyperlipidemia-continue statin. 4 prior pacemaker. 5 paroxysmal atrial fibrillation-the patient is in sinus rhythm. Anticoagulation was discontinued previously because of bruising. She had previous atrial clipping. 6 Elevated TSH - will need FU as outpt  Kirk Ruths 05/12/2015, 8:50 AM

## 2015-05-12 NOTE — H&P (View-Only) (Signed)
Interventional Consult Note: I was asked by  Lurena Joiner Kilroy/Dr. Bensimohn to evaluate this patient for possible urgent cardiac catheterization today.  The patient is a 75 year old female who underwent CABG revascularization surgery 4 months ago and had a LIMA placed to LAD, SVG to the PDA, SVG to the intermediate branch and also had a 35 mm atrial clip inserted done by Dr. Servando Snare.  She had severe multivessel CAD.  She has a history of peripheral vascular disease and previously had received stents to the SFA, and has a history of hypertension and hyperlipidemia.  She has a history of atrial fibrillation and underwent cardioversion on December 22 and had been on anticoagulation therapy, but due to continued bruising this ultimately was discontinued.  Patient was admitted last evening by Dr. Radford Pax with recurrent unstable angina symptoms that had been progressive over the past 3 days.  She was significantly hypertensive with a blood pressure 180/113.  Cardiac enzymes are positive with mildly positive troponins.  This morning while on nitroglycerin and heparin she developed recurrent anginal symptoms requiring dose titration.  This afternoon, she experienced 10 out of 10 chest tightness associated with diaphoresis and shortness of breath that was very concerning to the nurse who  felt as though she was becoming more unstable.  She was seen by Kerin Ransom and still had residual 3/10 chest pain despite increasing her nitroglycerin to 35 g and been given morphine sulfate.  She has an atrially paced rhythm with lateral ST segment changes.  I have seen and examined the patient.  Her blood pressure is 146/74.  Pulse is 75.  Sh is afebrile.  SHEENT is unremarkable.  There are no carotid bruits.  Her lungs were clear.  Her sternotomy scar was well-healed.  Rhythm was regular.  There was no ectopy.  Her abdomen was soft, nontender , and she was without hepatosplenomegaly.  Femoral pulses were adequate. Distal pulses are mildly  diminished.  There was no clubbing cyanosis or edema.  Neurologic exam was nonfocal. I have discussed the recurrent symptoms with her nurse, the patient and her husband.  With her progressive unstable symptoms , which occurred on increasing doses of IV nitroglycerin and heparin I have recommended urgent cardiac catheterization be performed today rather than, as is electively scheduled tomorrow.  The patient is aware of the risks benefits of the procedure and wishes to pursue this urgent catheterization today.  Troy Sine, MD  05/12/2015  4:59 PM

## 2015-05-13 ENCOUNTER — Encounter (HOSPITAL_COMMUNITY): Payer: Self-pay | Admitting: Cardiovascular Disease

## 2015-05-13 ENCOUNTER — Encounter (HOSPITAL_COMMUNITY): Payer: Commercial Managed Care - HMO

## 2015-05-13 DIAGNOSIS — Z951 Presence of aortocoronary bypass graft: Secondary | ICD-10-CM

## 2015-05-13 LAB — POCT ACTIVATED CLOTTING TIME
Activated Clotting Time: 425 seconds
Activated Clotting Time: 152 seconds

## 2015-05-13 LAB — MRSA PCR SCREENING: MRSA by PCR: NEGATIVE

## 2015-05-13 LAB — BASIC METABOLIC PANEL
Anion gap: 6 (ref 5–15)
BUN: 9 mg/dL (ref 6–20)
CO2: 25 mmol/L (ref 22–32)
Calcium: 9.3 mg/dL (ref 8.9–10.3)
Chloride: 108 mmol/L (ref 101–111)
Creatinine, Ser: 0.77 mg/dL (ref 0.44–1.00)
GFR calc Af Amer: >60 mL/min (ref >60)
GFR calc non Af Amer: >60 mL/min (ref >60)
Glucose, Bld: 87 mg/dL (ref 65–99)
Potassium: 3.9 mmol/L (ref 3.5–5.1)
Sodium: 139 mmol/L (ref 135–145)

## 2015-05-13 NOTE — Progress Notes (Signed)
ACT was 152 and right femoral sheath pulled at 0000 without difficulty. Pressure applied for 30 minutes. Wilburn [place], RN was at the bedside for the first 15 minutes. Right groin is a level 0. Vital signs remained stable, please see flow sheet documentation. Patient tolerated procedure well. Post sheath pull and bedrest instructions given via teach back method.

## 2015-05-13 NOTE — Progress Notes (Addendum)
SUBJECTIVE:  No chest pain this morning.  SHe is quite anxious about her early graft disease, and wondering if the stents might fail at some point as well. .  OBJECTIVE:   Vitals:   Filed Vitals:   05/13/15 0500 05/13/15 0600 05/13/15 0733 05/13/15 0800  BP: 146/96 159/94  146/98  Pulse: 73 75 76 75  Temp:    98.6 F (37 C)  TempSrc:    Oral  Resp: 18 20 8 11   Height:      Weight:      SpO2: 98% 95% 97% 97%   I&O's:   Intake/Output Summary (Last 24 hours) at 05/13/15 0941 Last data filed at 05/13/15 0900  Gross per 24 hour  Intake   1950 ml  Output   1950 ml  Net      0 ml   TELEMETRY: Reviewed telemetry pt in atrial paced rhythm.:     PHYSICAL EXAM General: Well developed, well nourished, in no acute distress Head:   Normal cephalic and atramatic  Lungs:   Clear bilaterally to auscultation. Heart:   HRRR S1 S2  No JVD.   Abdomen: abdomen soft and non-tender Msk:  Back normal,  Normal strength and tone for age. Extremities:   No edema.  Pressure dressing on the right groin. No obvious hematoma.  2+ right DP pulse Neuro: Alert and oriented. Psych:  Normal affect, responds appropriately Skin: No rash   LABS: Basic Metabolic Panel:  Recent Labs  05/12/15 0324 05/12/15 0905 05/13/15 0213  NA 138 136 139  K 3.9 4.3 3.9  CL 104 105 108  CO2 25 23 25   GLUCOSE 127* 121* 87  BUN 12 12 9   CREATININE 0.82 0.77 0.77  CALCIUM 9.9 9.5 9.3  MG 1.9  --   --    Liver Function Tests:  Recent Labs  05/12/15 0324  AST 30  ALT 23  ALKPHOS 99  BILITOT 0.8  PROT 6.3*  ALBUMIN 3.7   No results for input(s): LIPASE, AMYLASE in the last 72 hours. CBC:  Recent Labs  05/12/15 0324 05/12/15 0905  WBC 5.3 4.7  NEUTROABS 3.2  --   HGB 13.2 12.3  HCT 41.1 38.7  MCV 87.3 87.6  PLT 205 195   Cardiac Enzymes:  Recent Labs  05/12/15 0905 05/12/15 1509 05/12/15 2056  TROPONINI 0.24* 0.30* 0.59*   BNP: Invalid input(s): POCBNP D-Dimer: No results for  input(s): DDIMER in the last 72 hours. Hemoglobin A1C: No results for input(s): HGBA1C in the last 72 hours. Fasting Lipid Panel:  Recent Labs  05/12/15 0324  CHOL 158  HDL 64  LDLCALC 71  TRIG 114  CHOLHDL 2.5   Thyroid Function Tests:  Recent Labs  05/12/15 0324  TSH 5.390*   Anemia Panel: No results for input(s): VITAMINB12, FOLATE, FERRITIN, TIBC, IRON, RETICCTPCT in the last 72 hours. Coag Panel:   Lab Results  Component Value Date   INR 1.08 05/12/2015   INR 1.01 05/12/2015   INR 1.65* 01/31/2015    RADIOLOGY: Dg Chest 2 View  05/11/2015  CLINICAL DATA:  Chest pain 2 days radiating to back and right arm. EXAM: CHEST  2 VIEW COMPARISON:  04/02/2015 FINDINGS: Left-sided pacemaker and sternotomy wires unchanged. Lungs are adequately inflated without consolidation or effusion. Mild stable cardiomegaly. Degenerate change of the spine. IMPRESSION: No active cardiopulmonary disease. Stable cardiomegaly. Electronically Signed   By: Marin Olp M.D.   On: 05/11/2015 21:19   Ct Angio Chest  Aorta W/cm &/or Wo/cm  05/12/2015  CLINICAL DATA:  Right-sided chest pain for 2 days. Peripheral vascular disease. EXAM: CT ANGIOGRAPHY CHEST, ABDOMEN AND PELVIS TECHNIQUE: Multidetector CT imaging through the chest, abdomen and pelvis was performed using the standard protocol during bolus administration of intravenous contrast. Multiplanar reconstructed images and MIPs were obtained and reviewed to evaluate the vascular anatomy. Precontrast images of the chest were also performed. CONTRAST:  100 cc of Isovue 370 COMPARISON:  Plain films acute abdomen series 06/03/2012. Chest radiograph 05/11/2015. Abdominal pelvic CT of 01/01/2010. No prior chest CT FINDINGS: CT CHEST FINDINGS Mediastinum/Nodes: Mild artifacts superiorly from pacer battery. Dual lead pacer. Bilateral breast implants, partially calcified. Aortic atherosclerosis. No aneurysm. No dissection. Tortuous descending thoracic aorta. Mild  cardiomegaly with prior median sternotomy for CABG. No pulmonary embolism. Pulmonary artery enlargement, mild. Outflow tract 3.1 cm. No mediastinal adenopathy. Upper normal right hilar nodal tissue at 11 mm. Lungs/Pleura: No pleural fluid. Right upper lobe subsegmental atelectasis. Lingular volume loss. Musculoskeletal: No acute osseous abnormality. CT ABDOMEN PELVIS FINDINGS Hepatobiliary: Reflux of contrast in the IVC and hepatic veins, suggesting a component of elevated right heart pressures. Variant lateral segment left liver lobe extending in the left upper quadrant. Normal gallbladder, without biliary ductal dilatation. Pancreas: Normal, without mass or ductal dilatation. Spleen: Normal in size, without focal abnormality. Adrenals/Urinary Tract: Mild left adrenal thickening. Right adrenal nodularity is unchanged back to 2011 and likely an adenoma. Normal kidneys, without hydronephrosis. Normal urinary bladder. Stomach/Bowel: Normal stomach, without wall thickening. Surgical sutures again identified within the sigmoid. Large colonic stool burden, including in the region of sutures. Normal terminal ileum and appendix. Normal small bowel. Vascular/Lymphatic: Advanced abdominal aortic atherosclerosis. Patent mesenteric and renal arteries. No aneurysm or dissection. Normal caliber of pelvic vasculature No abdominopelvic adenopathy. Reproductive: Hysterectomy.  No adnexal mass. Other: No significant free fluid. Musculoskeletal: Lumbosacral spondylosis. Review of the MIP images confirms the above findings. IMPRESSION: 1. Extensive aortic atherosclerosis, without dissection or aneurysm. 2. No other explanation for chest pain. 3. Pulmonary artery enlargement suggests pulmonary arterial hypertension. 4.  No acute process in the abdomen or pelvis. 5. Surgical changes in the sigmoid with large amount of stool within this portion of the colon. This suggests constipation. Overall distribution is similar to on 01/01/2010.  Electronically Signed   By: Abigail Miyamoto M.D.   On: 05/12/2015 00:32   Ct Angio Abd/pel W/ And/or W/o  05/12/2015  CLINICAL DATA:  Right-sided chest pain for 2 days. Peripheral vascular disease. EXAM: CT ANGIOGRAPHY CHEST, ABDOMEN AND PELVIS TECHNIQUE: Multidetector CT imaging through the chest, abdomen and pelvis was performed using the standard protocol during bolus administration of intravenous contrast. Multiplanar reconstructed images and MIPs were obtained and reviewed to evaluate the vascular anatomy. Precontrast images of the chest were also performed. CONTRAST:  100 cc of Isovue 370 COMPARISON:  Plain films acute abdomen series 06/03/2012. Chest radiograph 05/11/2015. Abdominal pelvic CT of 01/01/2010. No prior chest CT FINDINGS: CT CHEST FINDINGS Mediastinum/Nodes: Mild artifacts superiorly from pacer battery. Dual lead pacer. Bilateral breast implants, partially calcified. Aortic atherosclerosis. No aneurysm. No dissection. Tortuous descending thoracic aorta. Mild cardiomegaly with prior median sternotomy for CABG. No pulmonary embolism. Pulmonary artery enlargement, mild. Outflow tract 3.1 cm. No mediastinal adenopathy. Upper normal right hilar nodal tissue at 11 mm. Lungs/Pleura: No pleural fluid. Right upper lobe subsegmental atelectasis. Lingular volume loss. Musculoskeletal: No acute osseous abnormality. CT ABDOMEN PELVIS FINDINGS Hepatobiliary: Reflux of contrast in the IVC and hepatic veins, suggesting a  component of elevated right heart pressures. Variant lateral segment left liver lobe extending in the left upper quadrant. Normal gallbladder, without biliary ductal dilatation. Pancreas: Normal, without mass or ductal dilatation. Spleen: Normal in size, without focal abnormality. Adrenals/Urinary Tract: Mild left adrenal thickening. Right adrenal nodularity is unchanged back to 2011 and likely an adenoma. Normal kidneys, without hydronephrosis. Normal urinary bladder. Stomach/Bowel: Normal  stomach, without wall thickening. Surgical sutures again identified within the sigmoid. Large colonic stool burden, including in the region of sutures. Normal terminal ileum and appendix. Normal small bowel. Vascular/Lymphatic: Advanced abdominal aortic atherosclerosis. Patent mesenteric and renal arteries. No aneurysm or dissection. Normal caliber of pelvic vasculature No abdominopelvic adenopathy. Reproductive: Hysterectomy.  No adnexal mass. Other: No significant free fluid. Musculoskeletal: Lumbosacral spondylosis. Review of the MIP images confirms the above findings. IMPRESSION: 1. Extensive aortic atherosclerosis, without dissection or aneurysm. 2. No other explanation for chest pain. 3. Pulmonary artery enlargement suggests pulmonary arterial hypertension. 4.  No acute process in the abdomen or pelvis. 5. Surgical changes in the sigmoid with large amount of stool within this portion of the colon. This suggests constipation. Overall distribution is similar to on 01/01/2010. Electronically Signed   By: Abigail Miyamoto M.D.   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  COntinue DAPT.  Lesion in native ramus present with SVG to ramus occluded.  If sx persist, would consider PCI of the ramus.  Currently, feels better.  Will watch in ICU overnight.  If we needed a bed, she could be moved if she remains sx free.    Discussed importance of DAPT.  Also discussed scenarios of stent failure as well at length with patient and family.  Continue statin.  LDL target < 70.  NTG off.  Watch BP.  Known HTN.  BP slightly increased at times.  Consider adding ACE-I.    Jettie Booze, MD  05/13/2015  9:41 AM

## 2015-05-13 NOTE — Progress Notes (Signed)
CARDIAC REHAB PHASE I   PRE:  Rate/Rhythm: pacing 75  BP:  Supine: 133/78  Sitting:   Standing:    SaO2: 95%RA  MODE:  Ambulation: 600 ft   POST:  Rate/Rhythm: 100 paced  BP:  Supine: 149/94  Sitting:   Standing:    SaO2: 96%RA 1445-1528 Pt walked 600 ft with hand held asst. Tolerated well. No CP. To bed after walk. Gave stent card and brilinta card. Discussed MI restrictions. Will follow up tomorrow.    Graylon Good, RN BSN  05/13/2015 3:25 PM

## 2015-05-14 MED ORDER — LISINOPRIL 10 MG PO TABS
10.0000 mg | ORAL_TABLET | Freq: Every day | ORAL | Status: DC
  Administered 2015-05-14 – 2015-05-15 (×2): 10 mg via ORAL
  Filled 2015-05-14 (×2): qty 1

## 2015-05-14 NOTE — Progress Notes (Signed)
SUBJECTIVE:  No chest pain this morning.  SHe is quite anxious about her early graft disease.  She walked with rehab and did well. Mild SHOB at times along with soe anxiety.  No CP like what brought her to the hospital.  OBJECTIVE:   Vitals:   Filed Vitals:   05/14/15 0400 05/14/15 0500 05/14/15 0600 05/14/15 0700  BP: 168/91 165/104 170/98 168/74  Pulse:      Temp: 98 F (36.7 C)     TempSrc: Oral     Resp: 9 7 12 14   Height:      Weight:      SpO2:       I&O's:    Intake/Output Summary (Last 24 hours) at 05/14/15 0900 Last data filed at 05/14/15 0747  Gross per 24 hour  Intake    610 ml  Output   3850 ml  Net  -3240 ml   TELEMETRY: Reviewed telemetry pt in atrial paced rhythm.:     PHYSICAL EXAM General: Well developed, well nourished, in no acute distress Head:   Normal cephalic and atramatic  Lungs:   Clear bilaterally to auscultation. Heart:   HRRR S1 S2  No JVD.   Abdomen: abdomen soft and non-tender Msk:  Back normal,  Normal strength and tone for age. Extremities:   No edema.   Neuro: Alert and oriented. Psych:  Normal affect, responds appropriately Skin: No rash   LABS: Basic Metabolic Panel:  Recent Labs  05/12/15 0324 05/12/15 0905 05/13/15 0213  NA 138 136 139  K 3.9 4.3 3.9  CL 104 105 108  CO2 25 23 25   GLUCOSE 127* 121* 87  BUN 12 12 9   CREATININE 0.82 0.77 0.77  CALCIUM 9.9 9.5 9.3  MG 1.9  --   --    Liver Function Tests:  Recent Labs  05/12/15 0324  AST 30  ALT 23  ALKPHOS 99  BILITOT 0.8  PROT 6.3*  ALBUMIN 3.7   No results for input(s): LIPASE, AMYLASE in the last 72 hours. CBC:  Recent Labs  05/12/15 0324 05/12/15 0905  WBC 5.3 4.7  NEUTROABS 3.2  --   HGB 13.2 12.3  HCT 41.1 38.7  MCV 87.3 87.6  PLT 205 195   Cardiac Enzymes:  Recent Labs  05/12/15 0905 05/12/15 1509 05/12/15 2056  TROPONINI 0.24* 0.30* 0.59*   BNP: Invalid input(s): POCBNP D-Dimer: No results for input(s): DDIMER in the last  72 hours. Hemoglobin A1C: No results for input(s): HGBA1C in the last 72 hours. Fasting Lipid Panel:  Recent Labs  05/12/15 0324  CHOL 158  HDL 64  LDLCALC 71  TRIG 114  CHOLHDL 2.5   Thyroid Function Tests:  Recent Labs  05/12/15 0324  TSH 5.390*   Anemia Panel: No results for input(s): VITAMINB12, FOLATE, FERRITIN, TIBC, IRON, RETICCTPCT in the last 72 hours. Coag Panel:   Lab Results  Component Value Date   INR 1.08 05/12/2015   INR 1.01 05/12/2015   INR 1.65* 01/31/2015    RADIOLOGY: Dg Chest 2 View  05/11/2015  CLINICAL DATA:  Chest pain 2 days radiating to back and right arm. EXAM: CHEST  2 VIEW COMPARISON:  04/02/2015 FINDINGS: Left-sided pacemaker and sternotomy wires unchanged. Lungs are adequately inflated without consolidation or effusion. Mild stable cardiomegaly. Degenerate change of the spine. IMPRESSION: No active cardiopulmonary disease. Stable cardiomegaly. Electronically Signed   By: Marin Olp M.D.   On: 05/11/2015 21:19   Ct Angio Chest Aorta W/cm &/  or Wo/cm  05/12/2015  CLINICAL DATA:  Right-sided chest pain for 2 days. Peripheral vascular disease. EXAM: CT ANGIOGRAPHY CHEST, ABDOMEN AND PELVIS TECHNIQUE: Multidetector CT imaging through the chest, abdomen and pelvis was performed using the standard protocol during bolus administration of intravenous contrast. Multiplanar reconstructed images and MIPs were obtained and reviewed to evaluate the vascular anatomy. Precontrast images of the chest were also performed. CONTRAST:  100 cc of Isovue 370 COMPARISON:  Plain films acute abdomen series 06/03/2012. Chest radiograph 05/11/2015. Abdominal pelvic CT of 01/01/2010. No prior chest CT FINDINGS: CT CHEST FINDINGS Mediastinum/Nodes: Mild artifacts superiorly from pacer battery. Dual lead pacer. Bilateral breast implants, partially calcified. Aortic atherosclerosis. No aneurysm. No dissection. Tortuous descending thoracic aorta. Mild cardiomegaly with prior median  sternotomy for CABG. No pulmonary embolism. Pulmonary artery enlargement, mild. Outflow tract 3.1 cm. No mediastinal adenopathy. Upper normal right hilar nodal tissue at 11 mm. Lungs/Pleura: No pleural fluid. Right upper lobe subsegmental atelectasis. Lingular volume loss. Musculoskeletal: No acute osseous abnormality. CT ABDOMEN PELVIS FINDINGS Hepatobiliary: Reflux of contrast in the IVC and hepatic veins, suggesting a component of elevated right heart pressures. Variant lateral segment left liver lobe extending in the left upper quadrant. Normal gallbladder, without biliary ductal dilatation. Pancreas: Normal, without mass or ductal dilatation. Spleen: Normal in size, without focal abnormality. Adrenals/Urinary Tract: Mild left adrenal thickening. Right adrenal nodularity is unchanged back to 2011 and likely an adenoma. Normal kidneys, without hydronephrosis. Normal urinary bladder. Stomach/Bowel: Normal stomach, without wall thickening. Surgical sutures again identified within the sigmoid. Large colonic stool burden, including in the region of sutures. Normal terminal ileum and appendix. Normal small bowel. Vascular/Lymphatic: Advanced abdominal aortic atherosclerosis. Patent mesenteric and renal arteries. No aneurysm or dissection. Normal caliber of pelvic vasculature No abdominopelvic adenopathy. Reproductive: Hysterectomy.  No adnexal mass. Other: No significant free fluid. Musculoskeletal: Lumbosacral spondylosis. Review of the MIP images confirms the above findings. IMPRESSION: 1. Extensive aortic atherosclerosis, without dissection or aneurysm. 2. No other explanation for chest pain. 3. Pulmonary artery enlargement suggests pulmonary arterial hypertension. 4.  No acute process in the abdomen or pelvis. 5. Surgical changes in the sigmoid with large amount of stool within this portion of the colon. This suggests constipation. Overall distribution is similar to on 01/01/2010. Electronically Signed   By: Abigail Miyamoto M.D.   On: 05/12/2015 00:32   Ct Angio Abd/pel W/ And/or W/o  05/12/2015  CLINICAL DATA:  Right-sided chest pain for 2 days. Peripheral vascular disease. EXAM: CT ANGIOGRAPHY CHEST, ABDOMEN AND PELVIS TECHNIQUE: Multidetector CT imaging through the chest, abdomen and pelvis was performed using the standard protocol during bolus administration of intravenous contrast. Multiplanar reconstructed images and MIPs were obtained and reviewed to evaluate the vascular anatomy. Precontrast images of the chest were also performed. CONTRAST:  100 cc of Isovue 370 COMPARISON:  Plain films acute abdomen series 06/03/2012. Chest radiograph 05/11/2015. Abdominal pelvic CT of 01/01/2010. No prior chest CT FINDINGS: CT CHEST FINDINGS Mediastinum/Nodes: Mild artifacts superiorly from pacer battery. Dual lead pacer. Bilateral breast implants, partially calcified. Aortic atherosclerosis. No aneurysm. No dissection. Tortuous descending thoracic aorta. Mild cardiomegaly with prior median sternotomy for CABG. No pulmonary embolism. Pulmonary artery enlargement, mild. Outflow tract 3.1 cm. No mediastinal adenopathy. Upper normal right hilar nodal tissue at 11 mm. Lungs/Pleura: No pleural fluid. Right upper lobe subsegmental atelectasis. Lingular volume loss. Musculoskeletal: No acute osseous abnormality. CT ABDOMEN PELVIS FINDINGS Hepatobiliary: Reflux of contrast in the IVC and hepatic veins, suggesting a component of  elevated right heart pressures. Variant lateral segment left liver lobe extending in the left upper quadrant. Normal gallbladder, without biliary ductal dilatation. Pancreas: Normal, without mass or ductal dilatation. Spleen: Normal in size, without focal abnormality. Adrenals/Urinary Tract: Mild left adrenal thickening. Right adrenal nodularity is unchanged back to 2011 and likely an adenoma. Normal kidneys, without hydronephrosis. Normal urinary bladder. Stomach/Bowel: Normal stomach, without wall thickening.  Surgical sutures again identified within the sigmoid. Large colonic stool burden, including in the region of sutures. Normal terminal ileum and appendix. Normal small bowel. Vascular/Lymphatic: Advanced abdominal aortic atherosclerosis. Patent mesenteric and renal arteries. No aneurysm or dissection. Normal caliber of pelvic vasculature No abdominopelvic adenopathy. Reproductive: Hysterectomy.  No adnexal mass. Other: No significant free fluid. Musculoskeletal: Lumbosacral spondylosis. Review of the MIP images confirms the above findings. IMPRESSION: 1. Extensive aortic atherosclerosis, without dissection or aneurysm. 2. No other explanation for chest pain. 3. Pulmonary artery enlargement suggests pulmonary arterial hypertension. 4.  No acute process in the abdomen or pelvis. 5. Surgical changes in the sigmoid with large amount of stool within this portion of the colon. This suggests constipation. Overall distribution is similar to on 01/01/2010. Electronically Signed   By: Abigail Miyamoto M.D.   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  COntinue DAPT.  Lesion in native ramus present with SVG to ramus occluded.  If sx persist, would consider PCI of the ramus.  Currently, feels better.  Did well walking.  Tranfer to tele.  I explained the importance of DAPT and the fact that some SHOB could come from [place], but would likely resolve with time..    Discussed importance of DAPT.  Also discussed scenarios of stent failure as well at length with patient and family.  Continue statin.  LDL target < 70.  NTG off.  Known HTN.  BP  increased at times.  Add ACE-I.    Jettie Booze, MD  05/14/2015  9:00 AM

## 2015-05-14 NOTE — Progress Notes (Signed)
CARDIAC REHAB PHASE I   PRE:  Rate/Rhythm: 79 paced  BP:  Supine:   Sitting: 150/78  Standing:    SaO2:   MODE:  Ambulation: 700 ft   POST:  Rate/Rhythm: 98 paced  BP:  Supine:   Sitting: 140/89  Standing:    SaO2:  1115-1148 Pt walked 700 ft with steady gait. C/o legs weak at first but improved with activity. No CP. Completed MI education. Will refer back to CRP 2 [place]. Reviewed NTG use and importance of letting cardiologist know if any recurring CP. Gave ex ed and heart healthy diet. To chair with call bell.   Graylon Good, RN BSN  05/14/2015 11:44 AM

## 2015-05-14 NOTE — Care Management Important Message (Signed)
Important Message  Patient Details  Name: [name] MRN: [id number] Date of Birth: Jun 30, 1940   Medicare Important Message Given:  Yes    Trung Wenzl P Edwar Coe 05/14/2015, 12:24 PM

## 2015-05-15 ENCOUNTER — Ambulatory Visit: Payer: Commercial Managed Care - HMO | Admitting: Cardiovascular Disease

## 2015-05-15 DIAGNOSIS — I25110 Atherosclerotic heart disease of native coronary artery with unstable angina pectoris: Secondary | ICD-10-CM

## 2015-05-15 DIAGNOSIS — Z955 Presence of coronary angioplasty implant and graft: Secondary | ICD-10-CM | POA: Insufficient documentation

## 2015-05-15 LAB — BASIC METABOLIC PANEL
Anion gap: 12 (ref 5–15)
BUN: 12 mg/dL (ref 6–20)
CO2: 25 mmol/L (ref 22–32)
Calcium: 10.7 mg/dL — ABNORMAL HIGH (ref 8.9–10.3)
Chloride: 104 mmol/L (ref 101–111)
Creatinine, Ser: 0.78 mg/dL (ref 0.44–1.00)
GFR calc Af Amer: >60 mL/min (ref >60)
GFR calc non Af Amer: >60 mL/min (ref >60)
Glucose, Bld: 105 mg/dL — ABNORMAL HIGH (ref 65–99)
Potassium: 4 mmol/L (ref 3.5–5.1)
Sodium: 141 mmol/L (ref 135–145)

## 2015-05-15 MED ORDER — ISOSORBIDE MONONITRATE ER 30 MG PO TB24: 30.0000 mg | ORAL_TABLET | Freq: Every day | ORAL | Status: DC

## 2015-05-15 MED ORDER — NITROGLYCERIN 0.4 MG SL SUBL: 0.4000 mg | SUBLINGUAL_TABLET | SUBLINGUAL | Status: AC | PRN

## 2015-05-15 MED ORDER — TICAGRELOR 90 MG PO TABS: 90.0000 mg | ORAL_TABLET | Freq: Two times a day (BID) | ORAL | Status: DC

## 2015-05-15 MED ORDER — ATORVASTATIN CALCIUM 80 MG PO TABS: 80.0000 mg | ORAL_TABLET | Freq: Every day | ORAL | Status: DC

## 2015-05-15 MED ORDER — LISINOPRIL 10 MG PO TABS: 10.0000 mg | ORAL_TABLET | Freq: Every day | ORAL | Status: DC

## 2015-05-15 NOTE — Progress Notes (Signed)
Pt denied any chest pain. She has been ambulating in bedroom and around unit and tolerating well. She is cleared for discharge by MD. I reviewed discharge instructions with pt. She verbalized understanding. PIV and telemetry were discontinued. Pt discharged to home at 1145.

## 2015-05-15 NOTE — Progress Notes (Addendum)
Patient Profile: 75 year old female who underwent CABG revascularization surgery 4 months ago and had a LIMA placed to LAD, SVG to the PDA, SVG to the intermediate branch and also had a 35 mm atrial clip inserted done by Dr. Servando Snare. She had severe multivessel CAD. She has a history of peripheral vascular disease and previously had received stents to the SFA, and has a history of hypertension and hyperlipidemia. She has a history of atrial fibrillation and underwent cardioversion on Dec 2016 and had been on anticoagulation therapy, but due to continued bruising this ultimately was discontinued.  She was admitted to [facility] 05/12/15 for unstable angina and ruled in for NSTEMI.   Subjective: No major complaints. She denies any recurrent CP. No symptoms while ambulating with cardiac rehab. She continues to have mild episodic dyspnea, but likely from [place].   Objective: Vital signs in last 24 hours: Temp:  [98.1 F (36.7 C)-98.5 F (36.9 C)] 98.1 F (36.7 C) (04/05 0431) Pulse Rate:  [76-77] 76 (04/05 0431) Resp:  [14-20] 18 (04/05 0431) BP: (123-174)/(62-90) 166/90 mmHg (04/05 0431) SpO2:  [94 %-97 %] 94 % (04/05 0431) Last BM Date: 05/11/15  Intake/Output from previous day: 04/04 0701 - 04/05 0700 In: 400 [P.O.:400] Out: 500 [Urine:500] Intake/Output this shift:    Medications Current Facility-Administered Medications  Medication Dose Route Frequency Provider Last Rate Last Dose  . 0.9 %  sodium chloride infusion  250 mL Intravenous PRN Troy Sine, MD   Stopped at 05/13/15 1000  . 0.9 %  sodium chloride infusion   Intravenous Continuous Troy Sine, MD   Stopped at 05/13/15 0900  . acetaminophen (TYLENOL) tablet 650 mg  650 mg Oral Q4H PRN Sueanne Margarita, MD   650 mg at 05/13/15 0732  . aspirin EC tablet 81 mg  81 mg Oral Daily Sueanne Margarita, MD   81 mg at 05/14/15 0857  . atorvastatin (LIPITOR) tablet 80 mg  80 mg Oral q1800 Troy Sine, MD   80 mg at 05/14/15 1737   . citalopram (CELEXA) tablet 20 mg  20 mg Oral Daily Sueanne Margarita, MD   20 mg at 05/14/15 0857  . heparin injection 5,000 Units  5,000 Units Subcutaneous 3 times per day Troy Sine, MD   5,000 Units at 05/15/15 [phone]  . isosorbide mononitrate (IMDUR) 24 hr tablet 30 mg  30 mg Oral Daily Troy Sine, MD   30 mg at 05/14/15 0857  . lisinopril (PRINIVIL,ZESTRIL) tablet 10 mg  10 mg Oral Daily Jettie Booze, MD   10 mg at 05/14/15 0945  . metoprolol tartrate (LOPRESSOR) tablet 25 mg  25 mg Oral BID Sueanne Margarita, MD   25 mg at 05/14/15 2225  . morphine 2 MG/ML injection 2 mg  2 mg Intravenous Q2H PRN Troy Sine, MD   2 mg at 05/12/15 2252  . nitroGLYCERIN (NITROSTAT) SL tablet 0.4 mg  0.4 mg Sublingual Q5 Min x 3 PRN Sueanne Margarita, MD      . nitroGLYCERIN 50 mg in dextrose 5 % 250 mL (0.2 mg/mL) infusion  2-200 mcg/min Intravenous Titrated Sueanne Margarita, MD   Stopped at 05/13/15 0900  . ondansetron (ZOFRAN) injection 4 mg  4 mg Intravenous Q6H PRN Sueanne Margarita, MD      . sodium chloride flush (NS) 0.9 % injection 3 mL  3 mL Intravenous Q12H Troy Sine, MD   3 mL at 05/14/15 2226  .  sodium chloride flush (NS) 0.9 % injection 3 mL  3 mL Intravenous PRN Troy Sine, MD      . ticagrelor [facility]) tablet 90 mg  90 mg Oral BID Troy Sine, MD   90 mg at 05/14/15 2225    PE: General appearance: alert, cooperative and no distress Neck: no carotid bruit and no JVD Lungs: clear to auscultation bilaterally Heart: regular rate and rhythm, S1, S2 normal, no murmur, click, rub or gallop Extremities: no LEE Pulses: 2+ and symmetric Skin: warm and dry Neurologic: Grossly normal  Lab Results:   Recent Labs  05/12/15 0905  WBC 4.7  HGB 12.3  HCT 38.7  PLT 195   BMET  Recent Labs  05/12/15 0905 05/13/15 0213 05/15/15 0257  NA 136 139 141  K 4.3 3.9 4.0  CL 105 108 104  CO2 23 25 25   GLUCOSE 121* 87 105*  BUN 12 9 12   CREATININE 0.77 0.77 0.78  CALCIUM 9.5  9.3 10.7*   PT/INR  Recent Labs  05/12/15 0905  LABPROT 14.2  INR 1.08    Assessment/Plan  Active Problems:   Hyperlipidemia   Essential hypertension   PVD (peripheral vascular disease), hx stents to bil SFAs 02/2010   CAD (coronary artery disease)   S/P CABG x 3 01/04/15   Chest pain   NSTEMI (non-ST elevated myocardial infarction) ([place])   1. NSTEMI/CAD: s/p successful intervention of the proximal to mid vein graft supplying the large distal RCA with ultimate insertion of a 2.7538 mm Synergy DES. Residual occluded SVG at its origin to the ramus intermediate vessel. PCI of this lesion could be considered if recurrent angina in the future. She denies any further CP. She is ambulating w/o exertional symptoms. Continue DAPT with ASA + Brilinta, high intensity statin, BB, ACE-I and LA nitrate.  2. Lipids: LDL goal is <70 mg/dL. Recent LDL is 71 mg/dL. Continue high intensity statin.   3.HTN: BP remains elevated this am in the [id number] systolic. HR in the 70s. May need to further titrate up meds. We will consider increasing metoprolol to 50 mg BID and/or lisinopril to 20 mg/day.   Dispo: plan for likely d/c home today after MD sees.     LOS: 4 days    Brittainy M. Ladoris Gene 05/15/2015 7:48 AM  I have examined the patient and reviewed assessment and plan and discussed with patient.  Agree with above as stated.  No angina.  Medical therapy for ramus lesion for now.  Lisinopril just started yesterday.  WIll continue current lisinopril and metoprolol doses.  She will check BP at home and report back to her cardiologist.  Since lisinopril was just started yesterday, and she had a 123/77 reading yesterday as well, will not increase lisinopril before discharge. Stressed importance of DAPT.  Has some intermittent SHOB which could be related to Brilinta.  If it does not resolve, may need to consider a different antiplatelet, although Effient would be suboptimal due to age and there is a  Plavix allergy, although it is unclear what the allergy actually was. OK to start Cardiac rehab on Monday 05/20/15.  Loula Marcella S.

## 2015-05-15 NOTE — Consult Note (Signed)
   [facility] CM Inpatient Consult   05/15/2015  [name] Jul 10, 1940 [id number]   Patient recently active with [facility] but declined further follow up and wanted to dis-enroll. Please see chart review tab then notes for patient outreach encounters from [facility] team. Martin Majestic to bedside to discuss. She states " I really do not need [facility] Care Management services". States she will call if she changes her mind. Will notify inpatient RNCM that patient pleasantly declines [facility] Care Management services.    Marthenia Rolling, MSN-Ed, RN,BSN [facility] Liaison [phone]

## 2015-05-15 NOTE — Discharge Summary (Signed)
Discharge Summary    Patient ID: [name],  MRN: [id number], DOB/AGE: Mar 07, 1940 75 y.o.  Admit date: 05/11/2015 Discharge date: 05/15/2015  Primary Care Provider: [name] Primary Cardiologist: Dr. Sallyanne Kuster  Discharge Diagnoses    Active Problems:   Hyperlipidemia   Essential hypertension   PVD (peripheral vascular disease), hx stents to bil SFAs 02/2010   CAD (coronary artery disease)   S/P CABG x 3 01/04/15   Chest pain   NSTEMI (non-ST elevated myocardial infarction) ([place])   Stented coronary artery   Allergies Allergies  Allergen Reactions  . Clonidine Derivatives Other (See Comments)    Lowers heart rate  . Plavix [Clopidogrel Bisulfate] Other (See Comments)    Joint pains  . Exforge [Amlodipine Besylate-Valsartan] Itching and Rash    Diagnostic Studies/Procedures    Conclusion     Mid LAD lesion, 85% stenosed.  Ramus lesion, 80% stenosed.  Prox Cx to Mid Cx lesion, 40% stenosed.  SVG was injected is normal in caliber.  There is severe disease in the graft.  Ostial occlusion of SVG to the ramus intermediate vessel.  Origin to Prox Graft lesion, 100% stenosed.  LIMA was injected is normal in caliber, and is anatomically normal.  There is competitive flow.  Widely patent LIMA graft which supplies the mid LAD.  SVG was injected is large.  There is severe disease in the graft.  Severely diseased proximal to mid portion of the vein graft which supplied the PDA vessel. Of note, the distal RCA. Antegrade to the PDA was totally occluded and did not supply the proximal to mid RCA due to this occlusion.  Prox Graft lesion, 70% stenosed.  Dist RCA-1 lesion, 100% stenosed.  Dist RCA-2 lesion, 100% stenosed.  Origin lesion, 95% stenosed. Post intervention, there is a 0% residual stenosis.  Prox RCA lesion, 95% stenosed. Post intervention, there is a 0% residual stenosis.  There is moderate left ventricular systolic dysfunction.         History of Present Illness     75 year old female who underwent CABG revascularization surgery 4 months ago and had a LIMA placed to LAD, SVG to the PDA, SVG to the intermediate branch and also had a 35 mm atrial clip inserted done by Dr. Servando Snare. She had severe multivessel CAD. She has a history of peripheral vascular disease and previously had received stents to the SFA, and has a history of hypertension and hyperlipidemia. She has a history of atrial fibrillation and underwent cardioversion on Dec 2016 and had been on anticoagulation therapy, but due to continued bruising this ultimately was discontinued.  She presented to [facility] on 05/11/15 with recurrent CP c/w unstable angina and ruled in for NSTEMI.   Hospital Course     Troponin level peaked at 0.59. She was placed on IV heparin and IV nitro. Given the fact that she presented on a Saturday, cath was initially planned for the following Monday, however due to persistent pain, despite treatment with IV nitro and IV heparin, urgent cardiac cath was performed by Dr. Claiborne Billings on Sunday 05/12/15. LHC revealed the following:  Significant native multivessel CAD with diffuse 85% proximal LAD stenosis with evidence for competitive filling via the LIMA graft supplying the mid LAD; 75-80% diffuse proximal ramus intermediate stenosis with evidence for an occluded vein graft anastomosed in its midst portion of the vessel; 40% left circumflex stenosis, and 95% proximal RCA stenosis which supplies 2 moderate size marginal branches prior to being totally occluded in  the region of the acute margin/distal RCA.  Severely diseased SVG which supplies a large distal RCA system giving rise to several branches and anastomosing into the PDA. The native distal RCA is occluded a short distance prior to the PDA takeoff and the graft does not supply the proximal - mid native vessel due to this occlusion.   Occluded SVG at its origin to the ramus intermediate vessel.  Patent  LIMA graft supplying the mid LAD.  She underwent successful percutaneous coronary intervention of the proximal to mid vein graft supplying the large distal RCA with ultimate insertion of a 2.7538 mm Synergy DES stent postdilated to 2.91 mm with the 95 and diffuse 70% stenosis being reduced to 0%.There was also successful percutaneous coronary intervention to the proximal native RCA which supplies 2 moderate size marginal branches prior to the acute margin/ distal RCA occlusion with PTCA/ 3.020 mm Synergy DES stent postdilated to 3.25 mm with the 95% stenosis being reduced to 0%. Medical therapy was elected for the SVG to the occluded ramus vessel. However an attempt at PCI can be considered if recurrent angina.   She left the cath lab in stable condition and was placed on DAPT with ASA + Brilinta. Her Lipitor was increased from 40 to 80 mg and Imdur was added. She was continued on metoprolol and lisinopril. She had no recurrent CP and no difficulties ambulating with cardiac rehab. No post cath complications. She did note some intermittent SOB which could be related to Brilinta. If it does not resolve, may need to consider a different antiplatelet, although Effient would be suboptimal due to age and there is a Plavix allergy, although it is unclear what the allergy actually was. She was last seen and examined by Dr. Irish Lack, who determined she was stable for discharge home. He felt that it was ok for her to return to cardiac rehab on 05/20/15. Post hospital f/u has been arranged on 05/22/15, with Kerin Ransom, PA-C.    Consultants: none  Discharge Vitals Blood pressure 166/90, pulse 76, temperature 98.1 F (36.7 C), temperature source Oral, resp. rate 18, height 5\' 4"  (1.626 m), weight 139 lb 8 oz (63.277 kg), SpO2 94 %.  Filed Weights   05/12/15 0322  Weight: 139 lb 8 oz (63.277 kg)    Labs & Radiologic Studies    CBC No results for input(s): WBC, NEUTROABS, HGB, HCT, MCV, PLT in the last 72  hours. Basic Metabolic Panel  Recent Labs  05/13/15 0213 05/15/15 0257  NA 139 141  K 3.9 4.0  CL 108 104  CO2 25 25  GLUCOSE 87 105*  BUN 9 12  CREATININE 0.77 0.78  CALCIUM 9.3 10.7*   Liver Function Tests No results for input(s): AST, ALT, ALKPHOS, BILITOT, PROT, ALBUMIN in the last 72 hours. No results for input(s): LIPASE, AMYLASE in the last 72 hours. Cardiac Enzymes  Recent Labs  05/12/15 1509 05/12/15 2056  TROPONINI 0.30* 0.59*   BNP Invalid input(s): POCBNP D-Dimer No results for input(s): DDIMER in the last 72 hours. Hemoglobin A1C No results for input(s): HGBA1C in the last 72 hours. Fasting Lipid Panel No results for input(s): CHOL, HDL, LDLCALC, TRIG, CHOLHDL, LDLDIRECT in the last 72 hours. Thyroid Function Tests No results for input(s): TSH, T4TOTAL, T3FREE, THYROIDAB in the last 72 hours.  Invalid input(s): FREET3 _____________  Dg Chest 2 View  05/11/2015  CLINICAL DATA:  Chest pain 2 days radiating to back and right arm. EXAM: CHEST  2 VIEW  COMPARISON:  04/02/2015 FINDINGS: Left-sided pacemaker and sternotomy wires unchanged. Lungs are adequately inflated without consolidation or effusion. Mild stable cardiomegaly. Degenerate change of the spine. IMPRESSION: No active cardiopulmonary disease. Stable cardiomegaly. Electronically Signed   By: Marin Olp M.D.   On: 05/11/2015 21:19   Ct Angio Chest Aorta W/cm &/or Wo/cm  05/12/2015  CLINICAL DATA:  Right-sided chest pain for 2 days. Peripheral vascular disease. EXAM: CT ANGIOGRAPHY CHEST, ABDOMEN AND PELVIS TECHNIQUE: Multidetector CT imaging through the chest, abdomen and pelvis was performed using the standard protocol during bolus administration of intravenous contrast. Multiplanar reconstructed images and MIPs were obtained and reviewed to evaluate the vascular anatomy. Precontrast images of the chest were also performed. CONTRAST:  100 cc of Isovue 370 COMPARISON:  Plain films acute abdomen series  06/03/2012. Chest radiograph 05/11/2015. Abdominal pelvic CT of 01/01/2010. No prior chest CT FINDINGS: CT CHEST FINDINGS Mediastinum/Nodes: Mild artifacts superiorly from pacer battery. Dual lead pacer. Bilateral breast implants, partially calcified. Aortic atherosclerosis. No aneurysm. No dissection. Tortuous descending thoracic aorta. Mild cardiomegaly with prior median sternotomy for CABG. No pulmonary embolism. Pulmonary artery enlargement, mild. Outflow tract 3.1 cm. No mediastinal adenopathy. Upper normal right hilar nodal tissue at 11 mm. Lungs/Pleura: No pleural fluid. Right upper lobe subsegmental atelectasis. Lingular volume loss. Musculoskeletal: No acute osseous abnormality. CT ABDOMEN PELVIS FINDINGS Hepatobiliary: Reflux of contrast in the IVC and hepatic veins, suggesting a component of elevated right heart pressures. Variant lateral segment left liver lobe extending in the left upper quadrant. Normal gallbladder, without biliary ductal dilatation. Pancreas: Normal, without mass or ductal dilatation. Spleen: Normal in size, without focal abnormality. Adrenals/Urinary Tract: Mild left adrenal thickening. Right adrenal nodularity is unchanged back to 2011 and likely an adenoma. Normal kidneys, without hydronephrosis. Normal urinary bladder. Stomach/Bowel: Normal stomach, without wall thickening. Surgical sutures again identified within the sigmoid. Large colonic stool burden, including in the region of sutures. Normal terminal ileum and appendix. Normal small bowel. Vascular/Lymphatic: Advanced abdominal aortic atherosclerosis. Patent mesenteric and renal arteries. No aneurysm or dissection. Normal caliber of pelvic vasculature No abdominopelvic adenopathy. Reproductive: Hysterectomy.  No adnexal mass. Other: No significant free fluid. Musculoskeletal: Lumbosacral spondylosis. Review of the MIP images confirms the above findings. IMPRESSION: 1. Extensive aortic atherosclerosis, without dissection or  aneurysm. 2. No other explanation for chest pain. 3. Pulmonary artery enlargement suggests pulmonary arterial hypertension. 4.  No acute process in the abdomen or pelvis. 5. Surgical changes in the sigmoid with large amount of stool within this portion of the colon. This suggests constipation. Overall distribution is similar to on 01/01/2010. Electronically Signed   By: Abigail Miyamoto M.D.   On: 05/12/2015 00:32   Ct Angio Abd/pel W/ And/or W/o  05/12/2015  CLINICAL DATA:  Right-sided chest pain for 2 days. Peripheral vascular disease. EXAM: CT ANGIOGRAPHY CHEST, ABDOMEN AND PELVIS TECHNIQUE: Multidetector CT imaging through the chest, abdomen and pelvis was performed using the standard protocol during bolus administration of intravenous contrast. Multiplanar reconstructed images and MIPs were obtained and reviewed to evaluate the vascular anatomy. Precontrast images of the chest were also performed. CONTRAST:  100 cc of Isovue 370 COMPARISON:  Plain films acute abdomen series 06/03/2012. Chest radiograph 05/11/2015. Abdominal pelvic CT of 01/01/2010. No prior chest CT FINDINGS: CT CHEST FINDINGS Mediastinum/Nodes: Mild artifacts superiorly from pacer battery. Dual lead pacer. Bilateral breast implants, partially calcified. Aortic atherosclerosis. No aneurysm. No dissection. Tortuous descending thoracic aorta. Mild cardiomegaly with prior median sternotomy for CABG. No pulmonary embolism. Pulmonary  artery enlargement, mild. Outflow tract 3.1 cm. No mediastinal adenopathy. Upper normal right hilar nodal tissue at 11 mm. Lungs/Pleura: No pleural fluid. Right upper lobe subsegmental atelectasis. Lingular volume loss. Musculoskeletal: No acute osseous abnormality. CT ABDOMEN PELVIS FINDINGS Hepatobiliary: Reflux of contrast in the IVC and hepatic veins, suggesting a component of elevated right heart pressures. Variant lateral segment left liver lobe extending in the left upper quadrant. Normal gallbladder, without  biliary ductal dilatation. Pancreas: Normal, without mass or ductal dilatation. Spleen: Normal in size, without focal abnormality. Adrenals/Urinary Tract: Mild left adrenal thickening. Right adrenal nodularity is unchanged back to 2011 and likely an adenoma. Normal kidneys, without hydronephrosis. Normal urinary bladder. Stomach/Bowel: Normal stomach, without wall thickening. Surgical sutures again identified within the sigmoid. Large colonic stool burden, including in the region of sutures. Normal terminal ileum and appendix. Normal small bowel. Vascular/Lymphatic: Advanced abdominal aortic atherosclerosis. Patent mesenteric and renal arteries. No aneurysm or dissection. Normal caliber of pelvic vasculature No abdominopelvic adenopathy. Reproductive: Hysterectomy.  No adnexal mass. Other: No significant free fluid. Musculoskeletal: Lumbosacral spondylosis. Review of the MIP images confirms the above findings. IMPRESSION: 1. Extensive aortic atherosclerosis, without dissection or aneurysm. 2. No other explanation for chest pain. 3. Pulmonary artery enlargement suggests pulmonary arterial hypertension. 4.  No acute process in the abdomen or pelvis. 5. Surgical changes in the sigmoid with large amount of stool within this portion of the colon. This suggests constipation. Overall distribution is similar to on 01/01/2010. Electronically Signed   By: Abigail Miyamoto M.D.   On: 05/12/2015 00:32   Disposition   Pt is being discharged home today in good condition.  Follow-up Plans & Appointments    Follow-up Information    Follow up with Erlene Quan, PA-C On 05/22/2015.   Specialties:  Cardiology, Radiology   Why:  3:15 PM (Dr. Victorino December PA)   Contact information:   [address] [place] [address] [phone]      Discharge Instructions    Amb Referral to Cardiac Rehabilitation    Complete by:  As directed   Diagnosis:   NSTEMI Comment - already in program PTCA       Diet - low  sodium heart healthy    Complete by:  As directed      Increase activity slowly    Complete by:  As directed            Discharge Medications   Discharge Medication List as of 05/15/2015 11:04 AM    START taking these medications   Details  isosorbide mononitrate (IMDUR) 30 MG 24 hr tablet Take 1 tablet (30 mg total) by mouth daily., Starting 05/15/2015, Until Discontinued, Normal    lisinopril (PRINIVIL,ZESTRIL) 10 MG tablet Take 1 tablet (10 mg total) by mouth daily., Starting 05/15/2015, Until Discontinued, Normal    nitroGLYCERIN (NITROSTAT) 0.4 MG SL tablet Place 1 tablet (0.4 mg total) under the tongue every 5 (five) minutes x 3 doses as needed for chest pain., Starting 05/15/2015, Until Discontinued, Normal    !! ticagrelor (BRILINTA) 90 MG TABS tablet Take 1 tablet (90 mg total) by mouth 2 (two) times daily., Starting 05/15/2015, Until Discontinued, Normal    !! ticagrelor (BRILINTA) 90 MG TABS tablet Take 1 tablet (90 mg total) by mouth 2 (two) times daily., Starting 05/15/2015, Until Discontinued, Normal     !! - Potential duplicate medications found. Please discuss with provider.    CONTINUE these medications which have CHANGED   Details  atorvastatin (LIPITOR)  80 MG tablet Take 1 tablet (80 mg total) by mouth daily at 6 PM., Starting 05/15/2015, Until Discontinued, Normal      CONTINUE these medications which have NOT CHANGED   Details  aspirin 81 MG EC tablet Take 81 mg by mouth daily. , Until Discontinued, Historical Med    cholecalciferol (VITAMIN D) 1000 UNITS tablet Take 1,000 Units by mouth daily., Until Discontinued, Historical Med    citalopram (CELEXA) 20 MG tablet Take 1 tablet (20 mg total) by mouth daily., Starting 12/05/2012, Until Discontinued, Normal    cyanocobalamin 500 MCG tablet Take 1 tablet (500 mcg total) by mouth daily., Starting 08/08/2014, Until Discontinued, Normal    metoprolol tartrate (LOPRESSOR) 25 MG tablet Take 1 tablet (25 mg total) by mouth 2  (two) times daily., Starting 02/13/2015, Until Discontinued, Normal    vitamin E 400 UNIT capsule Take 400 Units by mouth daily., Until Discontinued, Historical Med         Aspirin prescribed at discharge?  Yes High Intensity Statin Prescribed? (Lipitor 40-80mg  or Crestor 20-40mg ): Yes Beta Blocker Prescribed? Yes For EF <40%, was ACEI/ARB Prescribed? Yes ADP Receptor Inhibitor Prescribed? (i.e. Plavix etc.-Includes Medically Managed Patients): Yes For EF <40%, Aldosterone Inhibitor Prescribed? No: EF >40% Was EF assessed during THIS hospitalization? Yes Was Cardiac Rehab II ordered? (Included Medically managed Patients): Yes   Outstanding Labs/Studies   none  Duration of Discharge Encounter   Greater than 30 minutes including physician time.  Signed, Lyda Jester PA-C 05/15/2015, 2:34 PM  I have examined the patient and reviewed assessment and plan and discussed with patient. Agree with above as stated. No angina. Medical therapy for ramus lesion for now. Lisinopril just started yesterday. WIll continue current lisinopril and metoprolol doses. She will check BP at home and report back to her cardiologist. Since lisinopril was just started yesterday, and she had a 123/77 reading yesterday as well, will not increase lisinopril before discharge. Stressed importance of DAPT. Has some intermittent SHOB which could be related to Brilinta. If it does not resolve, may need to consider a different antiplatelet, although Effient would be suboptimal due to age and there is a Plavix allergy, although it is unclear what the allergy actually was. OK to start Cardiac rehab on Monday 05/20/15.  Raiven Belizaire S.

## 2015-05-15 NOTE — Progress Notes (Signed)
CARDIAC REHAB PHASE I   PRE:  Rate/Rhythm: pacing 77  BP:  Supine:   Sitting: 106/66  Standing:    SaO2: 96%RA  MODE:  Ambulation: 700 ft   POST:  Rate/Rhythm: 87  BP:  Supine: 173/85, 164/79  Sitting:   Standing:    SaO2: 100%RA [id number] Pt walked 700 ft with hand held asst with steady gait. No CP. Tolerated well.    Graylon Good, RN BSN  05/15/2015 9:30 AM

## 2015-05-20 ENCOUNTER — Telehealth: Payer: Self-pay | Admitting: Cardiovascular Disease

## 2015-05-20 ENCOUNTER — Telehealth: Payer: Self-pay | Admitting: Physician Assistant

## 2015-05-20 ENCOUNTER — Encounter (HOSPITAL_COMMUNITY)
Admission: RE | Admit: 2015-05-20 | Discharge: 2015-05-20 | Disposition: A | Discharge status: Home / Self Care | Payer: Commercial Managed Care - HMO | Source: Ambulatory Visit | Attending: Cardiovascular Disease | Admitting: Cardiovascular Disease

## 2015-05-20 ENCOUNTER — Telehealth (HOSPITAL_COMMUNITY): Payer: Self-pay | Admitting: *Deleted

## 2015-05-20 DIAGNOSIS — Z951 Presence of aortocoronary bypass graft: Secondary | ICD-10-CM

## 2015-05-20 MED ORDER — TICAGRELOR 90 MG PO TABS: 90.0000 mg | ORAL_TABLET | Freq: Two times a day (BID) | ORAL | Status: DC

## 2015-05-20 NOTE — Progress Notes (Signed)
Incomplete Session Note  Patient Details  Name: [name] MRN: [id number] Date of Birth: 08-17-1940 Referring Provider:  Kathyrn Lass, MD  [name] did not complete her rehab session.  [name] returned to exercise at cardiac rehab per Dr Irish Lack. Upon review of Ailine's medications. [name] told me she is not sure she is taking her Brillinta twice a day. [name] will not exercise today. Patient going home to check her medication bottles and call back. I asked [name] to bring her medication bottles when she returns to exercise. [name] did not exercise today. Blood pressure 111/69 heart rate 90. Oxygen saturation 96% on Room air. Dr Croitoru's office called and notified. Spoke with Trixie Dredge RN.

## 2015-05-20 NOTE — Telephone Encounter (Signed)
Please call asap,pt is on her way here to get samples.

## 2015-05-20 NOTE — Telephone Encounter (Signed)
Spoke to [place] at cardiac rehab  Patient is not sure if she has been taking Brilinta  Twice a day  She had a stent placed 05/12/15  will not do cardiac rehab today per Verdis Frederickson - patient will contact rehab and let them know  Will return on Wednesday  Rehab or the patient will call office to inform office of what the patient is taking

## 2015-05-20 NOTE — Telephone Encounter (Signed)
New message      Pt was at cardiac rehab this am.  She had open heart surgery in nov.  Pt called back and told Verdis Frederickson she had not been taking her brilinta at all.  Please call

## 2015-05-20 NOTE — Telephone Encounter (Signed)
Spoke to [employer], affirmed samples available. Put at front desk for pt pickup.

## 2015-05-20 NOTE — Telephone Encounter (Signed)
Called Verdis Frederickson back who said she had already spoke with Nell Range, APP, who said she was going to handle the pt from here.

## 2015-05-20 NOTE — Telephone Encounter (Addendum)
error 

## 2015-05-20 NOTE — Progress Notes (Signed)
Hoyle Sauer called back to report that she has not been taking her Brillinta. Nell Range [facility] called and notified.

## 2015-05-20 NOTE — Telephone Encounter (Signed)
    Maria from cardiac rehab called to notify us that the patient is no taking her Brilinta. She never got the 30 day free card and never picked any up. I am having her head to the [facility] office today to pick up samples and a 30 day free card. I explained how Brilinta is not an optional medication and that she is putting her life at risk by not taking it. She understands the urgency and will pick up the medication today.    Angelena Form PA-C  [facility]

## 2015-05-21 ENCOUNTER — Telehealth (HOSPITAL_COMMUNITY): Payer: Self-pay | Admitting: *Deleted

## 2015-05-22 ENCOUNTER — Encounter (HOSPITAL_COMMUNITY): Payer: Commercial Managed Care - HMO

## 2015-05-22 ENCOUNTER — Telehealth (HOSPITAL_COMMUNITY): Payer: Self-pay | Admitting: *Deleted

## 2015-05-23 ENCOUNTER — Encounter: Payer: Self-pay | Admitting: Cardiology

## 2015-05-23 ENCOUNTER — Ambulatory Visit (INDEPENDENT_AMBULATORY_CARE_PROVIDER_SITE_OTHER): Payer: Commercial Managed Care - HMO | Admitting: Cardiology

## 2015-05-23 VITALS — BP 120/80 | HR 82 | Ht 64.0 in | Wt 141.6 lb

## 2015-05-23 DIAGNOSIS — I480 Paroxysmal atrial fibrillation: Secondary | ICD-10-CM

## 2015-05-23 DIAGNOSIS — Z950 Presence of cardiac pacemaker: Secondary | ICD-10-CM

## 2015-05-23 DIAGNOSIS — Z951 Presence of aortocoronary bypass graft: Secondary | ICD-10-CM | POA: Diagnosis not present

## 2015-05-23 DIAGNOSIS — I214 Non-ST elevation (NSTEMI) myocardial infarction: Secondary | ICD-10-CM | POA: Diagnosis not present

## 2015-05-23 DIAGNOSIS — I739 Peripheral vascular disease, unspecified: Secondary | ICD-10-CM | POA: Diagnosis not present

## 2015-05-23 NOTE — Progress Notes (Signed)
05/23/2015 [name]   Jun 15, 1940  [id number]  Primary Physician [name], MD Primary Cardiologist: Dr Sallyanne Kuster  HPI:  75 y.o. female who is status post multivessel coronary bypass surgery (LIMA to LAD, SVG to PDA, SVG to intermedius branch, 35 mm atrial clip by Servando Snare on Nov 26th 2016). She had atrial clipping at the time of her bypass surgery. She had postoperative atrial fibrillation and underwent cardioversion on December 22.  She also has a history of peripheral arterial disease and has previously received stents to the superficial femoral arteries, hypertension, hyperlipidemia. She had postoperative atrial fibrillation but has had atrial fibrillation even before her bypass surgery. She has a dual-chamber permanent pacemaker implant in 2013 for symptomatic bradycardia (Medtronic). She had been on anticoagulation but this was stopped secondary to excessive bruising.          She presented 05/11/15 with [place]. She ruled in for a NSTEMI. The plan was to keep her on Heparin and cath the following Monday but she had unstable pain with hypotension on Sat and Dr Claiborne Billings elected to take her to the cath lab. See his note for complete details. She had an occlusion in the SVG-RCA which was stented as well as the native RCA. The SVG-RI was also diseased and she had competitive flow in the native LAD. The plan is for agressive medical Rx. She is in the office today for follow up. She has been doing well, she asked about returning to cardiac rehab, she also sees a Physiological scientist.    Current Outpatient Prescriptions  Medication Sig Dispense Refill  . aspirin 81 MG EC tablet Take 81 mg by mouth daily.     Marland Kitchen atorvastatin (LIPITOR) 80 MG tablet Take 1 tablet (80 mg total) by mouth daily at 6 PM. 30 tablet 5  . cholecalciferol (VITAMIN D) 1000 UNITS tablet Take 1,000 Units by mouth daily.    . citalopram (CELEXA) 20 MG tablet Take 1 tablet (20 mg total) by mouth daily. 90 tablet 0  . cyanocobalamin  500 MCG tablet Take 1 tablet (500 mcg total) by mouth daily. (Patient taking differently: Take 1,000 mcg by mouth daily. ) 90 tablet 3  . isosorbide mononitrate (IMDUR) 30 MG 24 hr tablet Take 1 tablet (30 mg total) by mouth daily. 30 tablet 5  . lisinopril (PRINIVIL,ZESTRIL) 10 MG tablet Take 1 tablet (10 mg total) by mouth daily. 30 tablet 5  . metoprolol tartrate (LOPRESSOR) 25 MG tablet Take 1 tablet (25 mg total) by mouth 2 (two) times daily. 180 tablet 1  . nitroGLYCERIN (NITROSTAT) 0.4 MG SL tablet Place 1 tablet (0.4 mg total) under the tongue every 5 (five) minutes x 3 doses as needed for chest pain. 25 tablet 2  . ticagrelor (BRILINTA) 90 MG TABS tablet Take 1 tablet (90 mg total) by mouth 2 (two) times daily. 180 tablet 3  . vitamin E 400 UNIT capsule Take 400 Units by mouth daily.     No current facility-administered medications for this visit.    Allergies  Allergen Reactions  . Clonidine Derivatives Other (See Comments)    Lowers heart rate  . Plavix [Clopidogrel Bisulfate] Other (See Comments)    Joint pains  . Exforge [Amlodipine Besylate-Valsartan] Itching and Rash    Social History   Social History  . Marital Status: Widowed    Spouse Name: N/A  . Number of Children: 3  . Years of Education: N/A   Occupational History  . retired Scientist, product/process development  banking    Social History Main Topics  . Smoking status: Former Smoker -- 0.75 packs/day for 10 years    Types: Cigarettes  . Smokeless tobacco: Never Used     Comment: 01/21/2012 "quit smoking ~ 2002"  . Alcohol Use: 9.0 oz/week    15 Shots of liquor per week     Comment: 01/21/2012 "3 times/wk I have a couple mixed drinks"  . Drug Use: No  . Sexual Activity: Not Currently   Other Topics Concern  . Not on file   Social History Narrative     Review of Systems: General: negative for chills, fever, night sweats or weight changes.  Cardiovascular: negative for chest pain, dyspnea on exertion, edema, orthopnea,  palpitations, paroxysmal nocturnal dyspnea or shortness of breath Dermatological: negative for rash Respiratory: negative for cough or wheezing Urologic: negative for hematuria Abdominal: negative for nausea, vomiting, diarrhea, bright red blood per rectum, melena, or hematemesis Neurologic: negative for visual changes, syncope, or dizziness All other systems reviewed and are otherwise negative except as noted above.    Blood pressure 120/80, pulse 82, height 5\' 4"  (1.626 m), weight 141 lb 9.6 oz (64.229 kg), SpO2 97 %.  General appearance: alert, cooperative and no distress Lungs: clear to auscultation bilaterally Heart: regular rate and rhythm Neurologic: Grossly normal  EKG Paced  ASSESSMENT AND PLAN:   NSTEMI (non-ST elevated myocardial infarction) ([place]) Just discharged 05/15/15 after NSTEMI- SVG-RCA DES, native RCA DES  S/P CABG x 3 01/04/15 S/P LIMA-LAD, SVG-RCA, SVG-RI  PVD (peripheral vascular disease), hx stents to bil SFAs 02/2010 .  PAF (paroxysmal atrial fibrillation) ([place]) DCCV after CABG- she had atrial clipping at the time of her CABG-(she is not anticoagulated)  S/P placement of cardiac pacemaker, medtronic adapta 01/21/12 .    PLAN  I suggested she wait till 4/24 to start back with rehab, and to wait till 5/8 to start with her personal trainer- (no heavy wgts). F/U in 6  Weeks.Marland Kitchen  Kerin Ransom K PA-C 05/23/2015 4:06 PM

## 2015-05-23 NOTE — Patient Instructions (Signed)
Your physician recommends that you schedule a follow-up appointment in: 6 Weeks with Dr Sallyanne Kuster  It is ok to exercise with personal trainer

## 2015-05-23 NOTE — Assessment & Plan Note (Signed)
Just discharged 05/15/15 after NSTEMI- SVG-RCA DES, native RCA DES

## 2015-05-23 NOTE — Assessment & Plan Note (Addendum)
DCCV after CABG- she had atrial clipping at the time of her CABG-(she is not anticoagulated)

## 2015-05-23 NOTE — Assessment & Plan Note (Signed)
S/P LIMA-LAD, SVG-RCA, SVG-RI

## 2015-05-27 ENCOUNTER — Encounter (HOSPITAL_COMMUNITY): Payer: Commercial Managed Care - HMO

## 2015-05-28 ENCOUNTER — Telehealth (HOSPITAL_COMMUNITY): Payer: Self-pay | Admitting: *Deleted

## 2015-05-29 ENCOUNTER — Encounter (HOSPITAL_COMMUNITY): Payer: Commercial Managed Care - HMO

## 2015-05-31 ENCOUNTER — Telehealth: Payer: Self-pay | Admitting: Cardiovascular Disease

## 2015-05-31 MED ORDER — CLOPIDOGREL BISULFATE 75 MG PO TABS: 75.0000 mg | ORAL_TABLET | Freq: Every day | ORAL | Status: DC

## 2015-05-31 NOTE — Telephone Encounter (Signed)
Could you please set her up for plavix 75 mg qd.  Thx

## 2015-05-31 NOTE — Telephone Encounter (Signed)
Discussed w Erasmo Downer - recommended loading dose 300mg  Plavix 1st day, 75mg  daily thereafter. OK to do loading dose same evening as last Brilinta dose if spaced a few hours apart.  Called pt and left msg to call back.

## 2015-05-31 NOTE — Telephone Encounter (Signed)
Returned call. Pt still experiencing some symptoms of SOB w activity which she's had since starting Brilinta in hospital 3 weeks ago. Notes her chief complaint is "feeling like i can't catch a full breath". She notes she is trying to drink a cup of coffee in the morning, not sure if this is helping w/ alleviation of symptoms.  Wondering how long effect of Brilinta is supposed to last before improvement.  Saw Luke 1 week ago in office for post hosp f/u. No new concerns or worse symptoms since that time.

## 2015-05-31 NOTE — Telephone Encounter (Signed)
Reached out to patient, communicated recommendations for discontinuation of Brilinta and start of Plavix. She voiced understanding of instructions and is aware to call if her concerns/symptoms do not improve after medication changes.  Rx sent to local pharmacy and mail order.

## 2015-05-31 NOTE — Telephone Encounter (Signed)
Dr. Loletha Grayer - looks like she isn't going to tolerate the Brilinta.  Do you have a preference for Effient or clopidogrel?  Or other thoughts?

## 2015-05-31 NOTE — Telephone Encounter (Signed)
I think Plavix 75 mg daily instead of Brilinta

## 2015-05-31 NOTE — Telephone Encounter (Signed)
New message     Pt c/o Shortness Of Breath: STAT if SOB developed within the last 24 hours or pt is noticeably SOB on the phone  1. Are you currently SOB (can you hear that pt is SOB on the phone)? No 2. How long have you been experiencing SOB?  Since being on brilinta  3. Are you SOB when sitting or when up moving around? Moving around  4. Are you currently experiencing any other symptoms? no Pt said it may be a side effect from the brilinta.  Please advise

## 2015-06-03 ENCOUNTER — Encounter (HOSPITAL_COMMUNITY)
Admit: 2015-06-03 | Discharge: 2015-06-03 | Disposition: A | Discharge status: Home / Self Care | Payer: Commercial Managed Care - HMO | Attending: Cardiovascular Disease | Admitting: Cardiovascular Disease

## 2015-06-03 DIAGNOSIS — Z951 Presence of aortocoronary bypass graft: Secondary | ICD-10-CM | POA: Diagnosis present

## 2015-06-03 NOTE — Progress Notes (Signed)
[name] 75 y.o. female Nutrition Note Spoke with pt. Nutrition Plan and Nutrition Survey goals reviewed with pt. Pt is following Step 2 of the Therapeutic Lifestyle Changes diet. Pt is pre-diabetic according to her last A1c. Pt states she was unaware she had pre-diabetes. Pre-diabetes discussed. Pt encouraged to discuss pre-diabetes/any further testing with her PCP. Pt expressed understanding of the information reviewed. Pt aware of nutrition education classes offered.  Lab Results  Component Value Date   HGBA1C 6.4* 01/31/2015   Wt Readings from Last 3 Encounters:  05/23/15 141 lb 9.6 oz (64.229 kg)  05/12/15 139 lb 8 oz (63.277 kg)  05/06/15 140 lb 12.8 oz (63.866 kg)    Nutrition Diagnosis ? Food-and nutrition-related knowledge deficit related to lack of exposure to information as related to diagnosis of: ? CVD ? Pre-DM  Nutrition RX/ Estimated Daily Nutrition Needs for: wt loss 1200 Kcal, 30 gm fat, 9 gm sat fat, 1.2 gm trans-fat, <1500 mg sodium  Nutrition Intervention ? Pt's individual nutrition plan reviewed with pt. ? Benefits of adopting Therapeutic Lifestyle Changes discussed when Medficts reviewed. ? Pt to attend the Portion Distortion class ? Pt to attend the   ? Nutrition I class                  ? Nutrition II class  ? Pt given handouts for: ? Nutrition I class ? Nutrition II class ? pre-diabetes ? Continue client-centered nutrition education by RD, as part of interdisciplinary care. Goal(s) ? Pt to identify food quantities necessary to achieve weight loss of 6-10 lb at graduation from cardiac rehab.  ? Pt able to name foods that affect blood glucose  Monitor and Evaluate progress toward nutrition goal with team. Derek Mound, M.Ed, RD, LDN, CDE 06/03/2015 10:49 AM

## 2015-06-03 NOTE — Progress Notes (Signed)
[name] returned to exercise at cardiac rehab today and exercised without complaint after her recent hospitalization. [name] was switched from [place] to Plavix due to her complaints of shortness of breath. Will continue to monitor the patient throughout  the program.

## 2015-06-05 ENCOUNTER — Encounter (HOSPITAL_COMMUNITY)
Admit: 2015-06-05 | Discharge: 2015-06-05 | Disposition: A | Discharge status: Home / Self Care | Payer: Commercial Managed Care - HMO | Attending: Cardiovascular Disease | Admitting: Cardiovascular Disease

## 2015-06-05 DIAGNOSIS — Z951 Presence of aortocoronary bypass graft: Secondary | ICD-10-CM | POA: Diagnosis not present

## 2015-06-06 NOTE — Progress Notes (Signed)
Cardiac Individual Treatment Plan  Patient Details  Name: [name] MRN: [id number] Date of Birth: 1941/01/06 Referring Provider:        CARDIAC REHAB PHASE II EXERCISE from 05/20/2015 in [facility]   Referring Provider  Sanda Klein MD      Initial Encounter Date:       CARDIAC REHAB PHASE II EXERCISE from 05/20/2015 in [place]   Date  05/03/15   Referring Provider  Croitoru, Mihai MD      Visit Diagnosis: No diagnosis found.  Patient's Home Medications on Admission:  Current outpatient prescriptions:  .  aspirin 81 MG EC tablet, Take 81 mg by mouth daily. , Disp: , Rfl:  .  atorvastatin (LIPITOR) 80 MG tablet, Take 1 tablet (80 mg total) by mouth daily at 6 PM., Disp: 30 tablet, Rfl: 5 .  cholecalciferol (VITAMIN D) 1000 UNITS tablet, Take 1,000 Units by mouth daily., Disp: , Rfl:  .  citalopram (CELEXA) 20 MG tablet, Take 1 tablet (20 mg total) by mouth daily., Disp: 90 tablet, Rfl: 0 .  clopidogrel (PLAVIX) 75 MG tablet, Take 1 tablet (75 mg total) by mouth daily., Disp: 90 tablet, Rfl: 1 .  cyanocobalamin 500 MCG tablet, Take 1 tablet (500 mcg total) by mouth daily. (Patient taking differently: Take 1,000 mcg by mouth daily. ), Disp: 90 tablet, Rfl: 3 .  isosorbide mononitrate (IMDUR) 30 MG 24 hr tablet, Take 1 tablet (30 mg total) by mouth daily., Disp: 30 tablet, Rfl: 5 .  lisinopril (PRINIVIL,ZESTRIL) 10 MG tablet, Take 1 tablet (10 mg total) by mouth daily., Disp: 30 tablet, Rfl: 5 .  metoprolol tartrate (LOPRESSOR) 25 MG tablet, Take 1 tablet (25 mg total) by mouth 2 (two) times daily., Disp: 180 tablet, Rfl: 1 .  nitroGLYCERIN (NITROSTAT) 0.4 MG SL tablet, Place 1 tablet (0.4 mg total) under the tongue every 5 (five) minutes x 3 doses as needed for chest pain., Disp: 25 tablet, Rfl: 2 .  vitamin E 400 UNIT capsule, Take 400 Units by mouth daily., Disp: , Rfl:   Past Medical History: Past Medical  History  Diagnosis Date  . GLUCOSE INTOLERANCE 12/31/2009  . HYPERLIPIDEMIA 12/31/2009  . ANXIETY 12/31/2009  . DEPRESSION 12/31/2009  . MITRAL REGURGITATION 12/31/2009  . HYPERTENSION 12/31/2009  . CORONARY ARTERY DISEASE 12/31/2009  . MITRAL VALVE PROLAPSE 12/31/2009  . MENOPAUSE, EARLY 12/31/2009  . Unspecified urinary incontinence 12/31/2009  . ABDOMINAL PAIN, LOWER 12/31/2009  . DIVERTICULITIS, HX OF 12/31/2009  . Pacemaker 01/21/2012    MDT Adapta dual chamber  . Tachycardia-bradycardia syndrome ([place])     [name] 01/21/2012  . PAD (peripheral artery disease) ([place])   . Heart murmur     "used to say I did" (01/21/2012)  . OSTEOARTHRITIS, HAND 12/31/2009  . [place] DISEASE, CERVICAL 12/31/2009  . [place] DISEASE, LUMBAR 12/31/2009  . Symptomatic sinus bradycardia 01/22/2012  . PAF (paroxysmal atrial fibrillation) ([place]) 01/22/2012  . Sinus node dysfunction ([place]) 01/22/2012  . PVD (peripheral vascular disease), hx stents to bil SFAs 02/2010 01/22/2012  . PAD (peripheral artery disease) ([place])   . Atrial fibrillation with RVR ([place])   . NSTEMI (non-ST elevated myocardial infarction) ([place]) 05/11/2015    Tobacco Use: History  Smoking status  . Former Smoker -- 0.75 packs/day for 10 years  . Types: Cigarettes  Smokeless tobacco  . Never Used    Comment: 01/21/2012 "quit smoking ~ 2002"    Labs: Recent  Review Flowsheet Data    Labs for ITP Cardiac and Pulmonary Rehab Latest Ref Rng 01/04/2015 01/05/2015 01/31/2015 02/01/2015 05/12/2015   Cholestrol 0 - 200 mg/dL - - - 124 158   LDLCALC 0 - 99 mg/dL - - - 68 71   HDL >40 mg/dL - - - 34(L) 64   Trlycerides <150 mg/dL - - - 110 114   Hemoglobin A1c 4.8 - 5.6 % - - 6.4(H) - -   PHART 7.350 - 7.450 7.344(L) - - - -   PCO2ART 35.0 - 45.0 mmHg 41.7 - - - -   HCO3 20.0 - 24.0 mEq/L 22.6 - - - -   TCO2 0 - 100 mmol/L 24 28 - - -   ACIDBASEDEF 0.0 - 2.0 mmol/L 3.0(H) - - - -   O2SAT - 96.0 - - - -      Capillary Blood Glucose: Lab  Results  Component Value Date   GLUCAP 119* 01/12/2015   GLUCAP 107* 01/11/2015   GLUCAP 102* 01/08/2015   GLUCAP 116* 01/08/2015   GLUCAP 106* 01/07/2015     Exercise Target Goals:    Exercise Program Goal: Individual exercise prescription set with THRR, safety & activity barriers. Participant demonstrates ability to understand and report RPE using BORG scale, to self-measure pulse accurately, and to acknowledge the importance of the exercise prescription.  Exercise Prescription Goal: Starting with aerobic activity 30 plus minutes a day, 3 days per week for initial exercise prescription. Provide home exercise prescription and guidelines that participant acknowledges understanding prior to discharge.  Activity Barriers & Risk Stratification:     Activity Barriers & Cardiac Risk Stratification - 04/23/15 1617    Activity Barriers & Cardiac Risk Stratification   Activity Barriers None   Cardiac Risk Stratification High      6 Minute Walk:     6 Minute Walk      05/03/15 1139       6 Minute Walk   Phase Initial     Distance 1673 feet     Walk Time 6 minutes     # of Rest Breaks 0     MPH 3.17     METS 3.55     RPE 11     VO2 Peak 12.42     Symptoms No     Resting HR 85 bpm     Resting BP 122/60 mmHg     Max Ex. HR 108 bpm     Max Ex. BP 134/62 mmHg        Initial Exercise Prescription:     Initial Exercise Prescription - 05/23/15 1400    Date of Initial Exercise RX and Referring Provider   Date 05/03/15   Referring Provider Croitoru, Mihai MD   Bike   Level 0.9   Minutes 10   METs 3.68   NuStep   Level 4   Minutes 10   METs 3   Track   Laps 16   Minutes 10   METs 3.6   Resistance Training   Training Prescription Yes   Weight 2 lbs   Reps 10-12      Perform Capillary Blood Glucose checks as needed.  Exercise Prescription Changes:     Exercise Prescription Changes      05/07/15 1300 05/10/15 1000 05/13/15 1300 06/03/15 1300      Exercise Review   Progression No  Pt's first full day of exercise was 05/06/15 No  Pt's first full day of exercise was  05/06/15 Yes No  just returning from readmission    Response to Exercise   Blood Pressure (Admit) 124/66 mmHg 124/66 mmHg 138/80 mmHg 128/80 mmHg    Blood Pressure (Exercise) 138/70 mmHg 138/70 mmHg 182/80 mmHg 140/80 mmHg    Blood Pressure (Exit) 104/64 mmHg 104/64 mmHg 124/78 mmHg 122/77 mmHg    Heart Rate (Admit) 77 bpm 77 bpm 96 bpm 79 bpm    Heart Rate (Exercise) 106 bpm 106 bpm 106 bpm 110 bpm    Heart Rate (Exit) 75 bpm 75 bpm 75 bpm 79 bpm    Rating of Perceived Exertion (Exercise) 12 12 13 10     Comments  Home Exercise Given 05/10/15 Home Exercise Given 05/10/15 Home Exercise Given 05/10/15    Duration Progress to 30 minutes of continuous aerobic without signs/symptoms of physical distress Progress to 30 minutes of continuous aerobic without signs/symptoms of physical distress Progress to 30 minutes of continuous aerobic without signs/symptoms of physical distress Progress to 30 minutes of continuous aerobic without signs/symptoms of physical distress    Intensity THRR unchanged THRR unchanged THRR unchanged THRR unchanged    Progression   Progression Continue to progress workloads to maintain intensity without signs/symptoms of physical distress. Continue to progress workloads to maintain intensity without signs/symptoms of physical distress. Continue to progress workloads to maintain intensity without signs/symptoms of physical distress. Continue to progress workloads to maintain intensity without signs/symptoms of physical distress.    Average METs 3.1 3.1 3.4 3    Resistance Training   Training Prescription Yes Yes Yes Yes    Weight 2 lbs 2 lbs 2 lbs 2 lbs    Reps 10-12 10-12 10-12 10-12    Interval Training   Interval Training   No No    Bike   Level 0.9 0.9 0.9 0.9    Minutes 10 10 10 10     METs 3.63 3.63 3.68 3.68    NuStep   Level 3 3 4 4     Minutes 10 10  10 10     METs 2.6 2.6 3 2.5    Track   Laps 12 12 16 10     Minutes 10 10 10 10     METs 3.07 3.07 3.6 2.76    Home Exercise Plan   Plans to continue exercise at  Home  walking and treadmill Home  walking and treadmill Home  walking and treadmill    Frequency  Add 3 additional days to program exercise sessions. Add 3 additional days to program exercise sessions. Add 3 additional days to program exercise sessions.       Exercise Comments:     Exercise Comments      05/07/15 1325 05/28/15 1016 06/03/15 1336       Exercise Comments Pt has recently started the program and is tolerating exercise well.  Will continue to monitor for exercise progression. Pt was out due to another MI.  She has been cleared to return. Pt has just returned to program after readmission with new MI.  She tolerated exercise well and was able to return to her previous workloads.        Discharge Exercise Prescription (Final Exercise Prescription Changes):     Exercise Prescription Changes - 06/03/15 1300    Exercise Review   Progression No  just returning from readmission   Response to Exercise   Blood Pressure (Admit) 128/80 mmHg   Blood Pressure (Exercise) 140/80 mmHg   Blood Pressure (Exit) 122/77 mmHg   Heart Rate (Admit)  79 bpm   Heart Rate (Exercise) 110 bpm   Heart Rate (Exit) 79 bpm   Rating of Perceived Exertion (Exercise) 10   Comments Home Exercise Given 05/10/15   Duration Progress to 30 minutes of continuous aerobic without signs/symptoms of physical distress   Intensity THRR unchanged   Progression   Progression Continue to progress workloads to maintain intensity without signs/symptoms of physical distress.   Average METs 3   Resistance Training   Training Prescription Yes   Weight 2 lbs   Reps 10-12   Interval Training   Interval Training No   Bike   Level 0.9   Minutes 10   METs 3.68   NuStep   Level 4   Minutes 10   METs 2.5   Track   Laps 10   Minutes 10   METs 2.76    Home Exercise Plan   Plans to continue exercise at Home  walking and treadmill   Frequency Add 3 additional days to program exercise sessions.      Nutrition:  Target Goals: Understanding of nutrition guidelines, daily intake of sodium 1500mg , cholesterol 200mg , calories 30% from fat and 7% or less from saturated fats, daily to have 5 or more servings of fruits and vegetables.  Biometrics:     Pre Biometrics - 04/25/15 1133    Pre Biometrics   Height 5\' 5"  (1.651 m)   Weight 141 lb 8.6 oz (64.2 kg)   Waist Circumference 29 inches   Hip Circumference 36.5 inches   Waist to Hip Ratio 0.79 %   BMI (Calculated) 23.6   Triceps Skinfold 23 mm   % Body Fat 34 %   Grip Strength 35 kg   Flexibility 14 in       Nutrition Therapy Plan and Nutrition Goals:     Nutrition Therapy & Goals - 04/24/15 0935    Nutrition Therapy   Diet Therapeutic Lifestyle Changes   Personal Nutrition Goals   Personal Goal #1 6-10 lb wt loss from current wt of 142.6 lb at graduation from [place] Goal #2 Establish healthy eating patterns, emphasizing a variety of nutrient-dense foods in appropriate portion sizes, to improve overall health and specifically to attain blood glucose control through diet.   Intervention Plan   Intervention Prescribe, educate and counsel regarding individualized specific dietary modifications aiming towards targeted core components such as weight, hypertension, lipid management, diabetes, heart failure and other comorbidities.   Expected Outcomes Short Term Goal: Understand basic principles of dietary content, such as calories, fat, sodium, cholesterol and nutrients.;Long Term Goal: Adherence to prescribed nutrition plan.      Nutrition Discharge: Nutrition Scores:     Nutrition Assessments - 06/03/15 1033    MEDFICTS Scores   Pre Score 29      Nutrition Goals Re-Evaluation:     Nutrition Goals Re-Evaluation      06/03/15 1057            Personal Goal #1 Re-Evaluation   Personal Goal #1 6-10 lb wt loss from current wt of 142.6 lb at graduation from Cardiac Rehab       Goal Progress Seen No       Comments Pt has maintained her wt       Personal Goal #2 Re-Evaluation   Personal Goal #2 Establish healthy eating patterns, emphasizing a variety of nutrient-dense foods in appropriate portion sizes, to improve overall health and specifically to attain blood glucose control through diet.  Goal Progress Seen Yes       Comments Pt following Step 2 of the Therapeutic Lifestyle Changes diet.        Intervention Plan   Intervention Continue to educate, counsel and set short/long term goals regarding individualized specific personal dietary modifications.;Nutrition handout(s) given to patient.  Pre-diabetes handout given          Psychosocial: Target Goals: Acknowledge presence or absence of depression, maximize coping skills, provide positive support system. Participant is able to verbalize types and ability to use techniques and skills needed for reducing stress and depression.  Initial Review & Psychosocial Screening:     Initial Psych Review & Screening - 05/03/15 1041    Family Dynamics   Good Support System? Yes   Concerns Recent loss of significant other   Comments husband passed away two years ago. Engaged to be married. Hoyle Sauer says she is happy with her life.   Barriers   Psychosocial barriers to participate in program Psychosocial barriers identified (see note)   Screening Interventions   Interventions Encouraged to exercise      Quality of Life Scores:     Quality of Life - 05/09/15 1227    Quality of Life Scores   Health/Function Pre --  Hoyle Sauer reported that she has experienced some shortness of breath but it is getting better      PHQ-9:     Recent Review Flowsheet Data    Depression screen [facility] 2/9 05/03/2015 03/16/2015 01/23/2015 08/06/2014   Decreased Interest 0 0 0 3   Down, Depressed, Hopeless 0  0 0 0   PHQ - 2 Score 0 0 0 3   Altered sleeping - - - 1   Tired, decreased energy - - - 3   Change in appetite - - - 0   Feeling bad or failure about yourself  - - - 0   Trouble concentrating - - - 0   Moving slowly or fidgety/restless - - - 3   Suicidal thoughts - - - 0   PHQ-9 Score - - - 10   Difficult doing work/chores - - - Not difficult at all      Psychosocial Evaluation and Intervention:   Psychosocial Re-Evaluation:     Psychosocial Re-Evaluation      06/03/15 1635           Psychosocial Re-Evaluation   Interventions Encouraged to attend Cardiac Rehabilitation for the exercise       Continued Psychosocial Services Needed No          Vocational Rehabilitation: Provide vocational rehab assistance to qualifying candidates.   Vocational Rehab Evaluation & Intervention:     Vocational Rehab - 05/03/15 1041    Initial Vocational Rehab Evaluation & Intervention   Assessment shows need for Vocational Rehabilitation No      Education: Education Goals: Education classes will be provided on a weekly basis, covering required topics. Participant will state understanding/return demonstration of topics presented.  Learning Barriers/Preferences:     Learning Barriers/Preferences - 04/23/15 1617    Learning Barriers/Preferences   Learning Barriers None   Learning Preferences Written Material      Education Topics: Count Your Pulse:  -Group instruction provided by verbal instruction, demonstration, patient participation and written materials to support subject.  Instructors address importance of being able to find your pulse and how to count your pulse when at home without a heart monitor.  Patients get hands on experience counting their pulse with staff help and  individually.   Heart Attack, Angina, and Risk Factor Modification:  -Group instruction provided by verbal instruction, video, and written materials to support subject.  Instructors address signs and  symptoms of angina and heart attacks.    Also discuss risk factors for heart disease and how to make changes to improve heart health risk factors.   Functional Fitness:  -Group instruction provided by verbal instruction, demonstration, patient participation, and written materials to support subject.  Instructors address safety measures for doing things around the house.  Discuss how to get up and down off the floor, how to pick things up properly, how to safely get out of a chair without assistance, and balance training.          CARDIAC REHAB PHASE II EXERCISE from 06/03/2015 in [place]   Date  05/03/15   Educator  Alberteen Sam, MA ACSM RCEP   Instruction Review Code  2- meets goals/outcomes      Meditation and Mindfulness:  -Group instruction provided by verbal instruction, patient participation, and written materials to support subject.  Instructor addresses importance of mindfulness and meditation practice to help reduce stress and improve awareness.  Instructor also leads participants through a meditation exercise.    Stretching for Flexibility and Mobility:  -Group instruction provided by verbal instruction, patient participation, and written materials to support subject.  Instructors lead participants through series of stretches that are designed to increase flexibility thus improving mobility.  These stretches are additional exercise for major muscle groups that are typically performed during regular warm up and cool down.   Hands Only CPR Anytime:  -Group instruction provided by verbal instruction, video, patient participation and written materials to support subject.  Instructors co-teach with AHA video for hands only CPR.  Participants get hands on experience with mannequins.   Nutrition I class: Heart Healthy Eating:  -Group instruction provided by PowerPoint slides, verbal discussion, and written materials to support subject matter. The  instructor gives an explanation and review of the Therapeutic Lifestyle Changes diet recommendations, which includes a discussion on lipid goals, dietary fat, sodium, fiber, plant stanol/sterol esters, sugar, and the components of a well-balanced, healthy diet.      CARDIAC REHAB PHASE II EXERCISE from 06/03/2015 in [place]   Date  06/03/15   Educator  RD   Instruction Review Code  Not applicable [Class handouts given]      Nutrition II class: Lifestyle Skills:  -Group instruction provided by PowerPoint slides, verbal discussion, and written materials to support subject matter. The instructor gives an explanation and review of label reading, grocery shopping for heart health, heart healthy recipe modifications, and ways to make healthier choices when eating out.      CARDIAC REHAB PHASE II EXERCISE from 06/03/2015 in [place]   Date  06/03/15   Educator  RD   Instruction Review Code  Not applicable [Class handouts given]      Diabetes Question & Answer:  -Group instruction provided by PowerPoint slides, verbal discussion, and written materials to support subject matter. The instructor gives an explanation and review of diabetes co-morbidities, pre- and post-prandial blood glucose goals, pre-exercise blood glucose goals, signs, symptoms, and treatment of hypoglycemia and hyperglycemia, and foot care basics.   Diabetes Blitz:  -Group instruction provided by PowerPoint slides, verbal discussion, and written materials to support subject matter. The instructor gives an explanation and review of the physiology behind type 1 and  type 2 diabetes, diabetes medications and rational behind using different medications, pre- and post-prandial blood glucose recommendations and Hemoglobin A1c goals, diabetes diet, and exercise including blood glucose guidelines for exercising safely.    Portion Distortion:  -Group instruction provided by  PowerPoint slides, verbal discussion, written materials, and food models to support subject matter. The instructor gives an explanation of serving size versus portion size, changes in portions sizes over the last 20 years, and what consists of a serving from each food group.   Stress Management:  -Group instruction provided by verbal instruction, video, and written materials to support subject matter.  Instructors review role of stress in heart disease and how to cope with stress positively.     Exercising on Your Own:  -Group instruction provided by verbal instruction, power point, and written materials to support subject.  Instructors discuss benefits of exercise, components of exercise, frequency and intensity of exercise, and end points for exercise.  Also discuss use of nitroglycerin and activating EMS.  Review options of places to exercise outside of rehab.  Review guidelines for sex with heart disease.   Cardiac Drugs I:  -Group instruction provided by verbal instruction and written materials to support subject.  Instructor reviews cardiac drug classes: antiplatelets, anticoagulants, beta blockers, and statins.  Instructor discusses reasons, side effects, and lifestyle considerations for each drug class.   Cardiac Drugs II:  -Group instruction provided by verbal instruction and written materials to support subject.  Instructor reviews cardiac drug classes: angiotensin converting enzyme inhibitors (ACE-I), angiotensin II receptor blockers (ARBs), nitrates, and calcium channel blockers.  Instructor discusses reasons, side effects, and lifestyle considerations for each drug class.   Anatomy and Physiology of the Circulatory System:  -Group instruction provided by verbal instruction, video, and written materials to support subject.  Reviews functional anatomy of heart, how it relates to various diagnoses, and what role the heart plays in the overall system.   Knowledge Questionnaire  Score:     Knowledge Questionnaire Score - 04/25/15 1133    Knowledge Questionnaire Score   Pre Score 21/24      Core Components/Risk Factors/Patient Goals at Admission:     Personal Goals and Risk Factors at Admission - 04/23/15 1458    Core Components/Risk Factors/Patient Goals on Admission   Expected Outcomes Short Term: Continue to assess and modify interventions until short term weight is achieved.;Long Term: Adherence to nutrition and physical activity/exercise program aimed toward attainment of established weight goal.   Sedentary Yes  since surgery   Intervention Provide advice, education, support and counseling about physical activity/exercise needs.;Develop an individualized exercise prescription for aerobic and resistive training based on initial evaluation findings, risk stratification, comorbidities and participant's personal goals.   Expected Outcomes Achievement of increased cardiorespiratory fitness and enhanced flexibility, muscular endurance and strength shown through measurements of functional capaciy and personal statement of participant.   Tobacco Cessation Yes  previous smoker, quit 15 years ago   Expected Outcomes Long Term: Complete abstinence from all tobacco products for at least 12 months from quit date.   Hypertension Yes   Intervention Provide education on lifestyle modifcations including regular physical activity/exercise, weight management, moderate sodium restriction and increased consumption of fresh fruit, vegetables, and low fat dairy, alcohol moderation, and smoking cessation.;Monitor prescription use compliance.   Expected Outcomes Short Term: Continued assessment and intervention until BP is < 140/67mm HG in hypertensive participants. < 130/63mm HG in hypertensive participants with diabetes, heart failure or chronic kidney disease.   Lipids  Yes   Intervention Provide education and support for participant on nutrition & aerobic/resistive exercise along  with prescribed medications to achieve LDL 70mg , HDL >40mg .   Expected Outcomes Short Term: Participant states understanding of desired cholesterol values and is compliant with medications prescribed. Participant is following exercise prescription and nutrition guidelines.;Long Term: Cholesterol controlled with medications as prescribed, with individualized exercise RX and with personalized nutrition plan. Value goals: LDL < 70mg , HDL > 40 mg.   Stress Yes   Personal Goal Other Yes   Personal Goal Increase leg strength   Intervention Design exercise prescription to increase leg strength   Expected Outcomes Able to improve walk test and leg strength      Core Components/Risk Factors/Patient Goals Review:    Core Components/Risk Factors/Patient Goals at Discharge (Final Review):    ITP Comments:     ITP Comments      04/24/15 1623           ITP Comments Dr. Fransico Him, Medical Director          Comments: Pt is making expected progress toward personal goals after completing 6 sessions. Recommend continued exercise and life style modification education including  stress management and relaxation techniques to decrease cardiac risk profile.

## 2015-06-07 ENCOUNTER — Telehealth (HOSPITAL_COMMUNITY): Payer: Self-pay | Admitting: *Deleted

## 2015-06-10 ENCOUNTER — Telehealth (HOSPITAL_COMMUNITY): Payer: Self-pay | Admitting: Family Medicine

## 2015-06-10 ENCOUNTER — Encounter (HOSPITAL_COMMUNITY): Payer: Commercial Managed Care - HMO

## 2015-06-12 ENCOUNTER — Telehealth (HOSPITAL_COMMUNITY): Payer: Self-pay | Admitting: *Deleted

## 2015-06-12 ENCOUNTER — Encounter (HOSPITAL_COMMUNITY)
Admission: RE | Admit: 2015-06-12 | Discharge: 2015-06-12 | Disposition: A | Discharge status: Home / Self Care | Payer: Commercial Managed Care - HMO | Source: Ambulatory Visit | Attending: Cardiovascular Disease | Admitting: Cardiovascular Disease

## 2015-06-12 DIAGNOSIS — Z951 Presence of aortocoronary bypass graft: Secondary | ICD-10-CM | POA: Insufficient documentation

## 2015-06-17 ENCOUNTER — Telehealth (HOSPITAL_COMMUNITY): Payer: Self-pay | Admitting: Family Medicine

## 2015-06-17 ENCOUNTER — Encounter (HOSPITAL_COMMUNITY): Payer: Commercial Managed Care - HMO

## 2015-06-19 ENCOUNTER — Encounter (HOSPITAL_COMMUNITY): Payer: Commercial Managed Care - HMO

## 2015-06-24 ENCOUNTER — Encounter (HOSPITAL_COMMUNITY): Payer: Commercial Managed Care - HMO

## 2015-06-26 ENCOUNTER — Other Ambulatory Visit: Payer: Self-pay | Admitting: Nurse Practitioner

## 2015-06-26 ENCOUNTER — Emergency Department (HOSPITAL_COMMUNITY)
Admission: EM | Admit: 2015-06-26 | Discharge: 2015-06-26 | Disposition: A | Discharge status: Home / Self Care | Payer: Commercial Managed Care - HMO | Attending: Emergency Medicine | Admitting: Emergency Medicine

## 2015-06-26 ENCOUNTER — Encounter (HOSPITAL_COMMUNITY): Payer: Self-pay | Admitting: *Deleted

## 2015-06-26 ENCOUNTER — Emergency Department (HOSPITAL_COMMUNITY): Payer: Commercial Managed Care - HMO

## 2015-06-26 ENCOUNTER — Encounter (HOSPITAL_COMMUNITY): Payer: Commercial Managed Care - HMO

## 2015-06-26 ENCOUNTER — Other Ambulatory Visit: Payer: Self-pay

## 2015-06-26 DIAGNOSIS — I252 Old myocardial infarction: Secondary | ICD-10-CM | POA: Insufficient documentation

## 2015-06-26 DIAGNOSIS — Z951 Presence of aortocoronary bypass graft: Secondary | ICD-10-CM | POA: Diagnosis not present

## 2015-06-26 DIAGNOSIS — R0602 Shortness of breath: Secondary | ICD-10-CM | POA: Diagnosis not present

## 2015-06-26 DIAGNOSIS — Z950 Presence of cardiac pacemaker: Secondary | ICD-10-CM | POA: Diagnosis not present

## 2015-06-26 DIAGNOSIS — F329 Major depressive disorder, single episode, unspecified: Secondary | ICD-10-CM | POA: Diagnosis not present

## 2015-06-26 DIAGNOSIS — Z87891 Personal history of nicotine dependence: Secondary | ICD-10-CM | POA: Insufficient documentation

## 2015-06-26 DIAGNOSIS — I2510 Atherosclerotic heart disease of native coronary artery without angina pectoris: Secondary | ICD-10-CM | POA: Diagnosis not present

## 2015-06-26 DIAGNOSIS — E785 Hyperlipidemia, unspecified: Secondary | ICD-10-CM | POA: Diagnosis not present

## 2015-06-26 DIAGNOSIS — R079 Chest pain, unspecified: Secondary | ICD-10-CM | POA: Insufficient documentation

## 2015-06-26 DIAGNOSIS — F419 Anxiety disorder, unspecified: Secondary | ICD-10-CM | POA: Insufficient documentation

## 2015-06-26 DIAGNOSIS — Z7902 Long term (current) use of antithrombotics/antiplatelets: Secondary | ICD-10-CM | POA: Diagnosis not present

## 2015-06-26 DIAGNOSIS — Z8742 Personal history of other diseases of the female genital tract: Secondary | ICD-10-CM | POA: Insufficient documentation

## 2015-06-26 DIAGNOSIS — Z8719 Personal history of other diseases of the digestive system: Secondary | ICD-10-CM | POA: Diagnosis not present

## 2015-06-26 DIAGNOSIS — I480 Paroxysmal atrial fibrillation: Secondary | ICD-10-CM | POA: Insufficient documentation

## 2015-06-26 DIAGNOSIS — M19049 Primary osteoarthritis, unspecified hand: Secondary | ICD-10-CM | POA: Diagnosis not present

## 2015-06-26 DIAGNOSIS — Z7982 Long term (current) use of aspirin: Secondary | ICD-10-CM | POA: Insufficient documentation

## 2015-06-26 DIAGNOSIS — Z79899 Other long term (current) drug therapy: Secondary | ICD-10-CM | POA: Diagnosis not present

## 2015-06-26 DIAGNOSIS — R0600 Dyspnea, unspecified: Secondary | ICD-10-CM

## 2015-06-26 DIAGNOSIS — R011 Cardiac murmur, unspecified: Secondary | ICD-10-CM | POA: Insufficient documentation

## 2015-06-26 DIAGNOSIS — Z9889 Other specified postprocedural states: Secondary | ICD-10-CM | POA: Insufficient documentation

## 2015-06-26 DIAGNOSIS — R05 Cough: Secondary | ICD-10-CM | POA: Diagnosis present

## 2015-06-26 DIAGNOSIS — I10 Essential (primary) hypertension: Secondary | ICD-10-CM | POA: Diagnosis not present

## 2015-06-26 LAB — BASIC METABOLIC PANEL
Anion gap: 12 (ref 5–15)
BUN: 14 mg/dL (ref 6–20)
CO2: 23 mmol/L (ref 22–32)
Calcium: 10.2 mg/dL (ref 8.9–10.3)
Chloride: 99 mmol/L — ABNORMAL LOW (ref 101–111)
Creatinine, Ser: 0.81 mg/dL (ref 0.44–1.00)
GFR calc Af Amer: >60 mL/min (ref >60)
GFR calc non Af Amer: >60 mL/min (ref >60)
Glucose, Bld: 104 mg/dL — ABNORMAL HIGH (ref 65–99)
Potassium: 4 mmol/L (ref 3.5–5.1)
Sodium: 134 mmol/L — ABNORMAL LOW (ref 135–145)

## 2015-06-26 LAB — I-STAT TROPONIN, ED
Troponin i, poc: 0 ng/mL (ref 0.00–0.08)
Troponin i, poc: 0 ng/mL (ref 0.00–0.08)

## 2015-06-26 LAB — CBC
HCT: 35.1 % — ABNORMAL LOW (ref 36.0–46.0)
Hemoglobin: 12 g/dL (ref 12.0–15.0)
MCH: 29.2 pg (ref 26.0–34.0)
MCHC: 34.2 g/dL (ref 30.0–36.0)
MCV: 85.4 fL (ref 78.0–100.0)
Platelets: 189 10*3/uL (ref 150–400)
RBC: 4.11 MIL/uL (ref 3.87–5.11)
RDW: 14.3 % (ref 11.5–15.5)
WBC: 6.6 10*3/uL (ref 4.0–10.5)

## 2015-06-26 LAB — LIPASE, BLOOD: Lipase: 46 U/L (ref 11–51)

## 2015-06-26 LAB — HEPATIC FUNCTION PANEL
ALT: 14 U/L (ref 14–54)
AST: 21 U/L (ref 15–41)
Albumin: 2.8 g/dL — ABNORMAL LOW (ref 3.5–5.0)
Alkaline Phosphatase: 118 U/L (ref 38–126)
Bilirubin, Direct: 0.3 mg/dL (ref 0.1–0.5)
Indirect Bilirubin: 0.3 mg/dL (ref 0.3–0.9)
Total Bilirubin: 0.6 mg/dL (ref 0.3–1.2)
Total Protein: 6.2 g/dL — ABNORMAL LOW (ref 6.5–8.1)

## 2015-06-26 LAB — BRAIN NATRIURETIC PEPTIDE: B Natriuretic Peptide: 427.7 pg/mL — ABNORMAL HIGH (ref 0.0–100.0)

## 2015-06-26 NOTE — Discharge Instructions (Signed)

## 2015-06-26 NOTE — ED Provider Notes (Signed)
CSN: [id number]     Arrival date & time 06/26/15  1055 History   First MD Initiated Contact with Patient 06/26/15 1110     Chief Complaint  Patient presents with  . Chest Pain  . Cough     (Consider location/radiation/quality/duration/timing/severity/associated sxs/prior Treatment) Patient is a 75 y.o. female presenting with chest pain and cough.  Chest Pain Pain location:  R chest and R lateral chest Pain quality: sharp and shooting   Pain radiates to:  Mid back Pain radiates to the back: yes   Pain severity:  Moderate Onset quality:  Sudden Duration:  2 days Timing:  Constant Progression:  Worsening Chronicity:  New Relieved by:  Nothing Worsened by:  Nothing tried Ineffective treatments:  None tried Associated symptoms: cough   Associated symptoms: no dizziness, no fever, no headache, no nausea, no palpitations, no shortness of breath and not vomiting   Cough Associated symptoms: no chest pain, no chills, no fever, no headaches, no myalgias, no rhinorrhea, no shortness of breath and no wheezing    75 yo F With a chief complaint of weakness and shortness of breath on exertion. Was associated with right-sided chest pain. Pain starts in the anterior region and radiates to the back. The pain itself is nonexertional. She has no lower extremity edema. Had a recent and STEMI where she had a three-way bypass. Had stenting of 2 of the bypass grafts. Having some fevers and chills and myalgias as well. Saw her family doctor who thought she had the flu and referred her here.  Past Medical History  Diagnosis Date  . GLUCOSE INTOLERANCE 12/31/2009  . HYPERLIPIDEMIA 12/31/2009  . ANXIETY 12/31/2009  . DEPRESSION 12/31/2009  . MITRAL REGURGITATION 12/31/2009  . HYPERTENSION 12/31/2009  . CORONARY ARTERY DISEASE 12/31/2009  . MITRAL VALVE PROLAPSE 12/31/2009  . MENOPAUSE, EARLY 12/31/2009  . Unspecified urinary incontinence 12/31/2009  . ABDOMINAL PAIN, LOWER 12/31/2009  .  DIVERTICULITIS, HX OF 12/31/2009  . Pacemaker 01/21/2012    MDT Adapta dual chamber  . Tachycardia-bradycardia syndrome ([place])     [name] 01/21/2012  . PAD (peripheral artery disease) ([place])   . Heart murmur     "used to say I did" (01/21/2012)  . OSTEOARTHRITIS, HAND 12/31/2009  . [place] DISEASE, CERVICAL 12/31/2009  . [place] DISEASE, LUMBAR 12/31/2009  . Symptomatic sinus bradycardia 01/22/2012  . PAF (paroxysmal atrial fibrillation) ([place]) 01/22/2012  . Sinus node dysfunction ([place]) 01/22/2012  . PVD (peripheral vascular disease), hx stents to bil SFAs 02/2010 01/22/2012  . PAD (peripheral artery disease) ([place])   . Atrial fibrillation with RVR ([place])   . NSTEMI (non-ST elevated myocardial infarction) ([place]) 05/11/2015   Past Surgical History  Procedure Laterality Date  . Cardiac catheterization  2005    "negative" (01/21/2012)  . Oophorectomy  ~1979  . Partial colectomy  2010  . Carpal tunnel release  2008    "right hand/thumb; carpal tunnel repair; got rid of arthritis" (01/21/2012)  . Insert / replace / remove pacemaker  01/21/2012    initial placement  . Vaginal hysterectomy  1975  . Posterior cervical laminectomy  1985  . Peripheral arterial stent graft  2012; 2012    "LLE; RLE" (01/21/2012)  . Facelift, lower 2/3  1995    "mini" (01/21/2012)  . Augmentation mammaplasty  1983  . Pacemaker insertion  01/21/2012    Medtronic Adapta, model# ADDRL1, serial# [id number]  . Lower arterial examination  10/28/2011    R. SFA stent mild-moderate mixed density plaque with  elevated velocities consistent with 50% diameter reduction. L. SFA stent moderate mixed denisty plaque at mid to distal level consistent with 50-69% diameter reduction.  . Cardiac catheterization  09/17/1999    Mild CAD involving proximal portion of LAD. Mitral valve prolapse w/ mitral regurg. No evidence of renal artery stenosis. Recommended readjustment of antihypertensive medications.  . Persantine myoview stress test   02/25/2010    No scintigraphic evidence of inducible myocardial ischemia. No persantine EKG changes. Non-diagnositc for ishcemia. Low-risk, normal scan.  Marland Kitchen Permanent pacemaker insertion N/A 01/21/2012    Procedure: PERMANENT PACEMAKER INSERTION;  Surgeon: Sanda Klein, MD;  Location: [facility] CATH LAB;  Service: Cardiovascular;  Laterality: N/A;  . Cardiac catheterization N/A 01/01/2015    Procedure: Left Heart Cath and Coronary Angiography;  Surgeon: Jettie Booze, MD;  Location: [place] CV LAB;  Service: Cardiovascular;  Laterality: N/A;  . Coronary artery bypass graft N/A 01/04/2015    Procedure: CORONARY ARTERY BYPASS GRAFTING (CABG) x 3 using left internal mammory artery and greater saphenous vein right leg harvested endoscopically.;  Surgeon: Grace Isaac, MD;  LIMA-LAD, SVG-RI, SVG-PDA  . Tee without cardioversion N/A 01/04/2015    Procedure: TRANSESOPHAGEAL ECHOCARDIOGRAM (TEE);  Surgeon: Grace Isaac, MD;  Location: [facility];  Service: Open Heart Surgery;  Laterality: N/A;  . Clipping of atrial appendage N/A 01/04/2015    Procedure: CLIPPING OF ATRIAL APPENDAGE;  Surgeon: Grace Isaac, MD;  Location: [facility];  Service: Open Heart Surgery;  Laterality: N/A;  . Cardioversion N/A 02/01/2015    Procedure: CARDIOVERSION;  Surgeon: Lelon Perla, MD;  Location: [facility] ENDOSCOPY;  Service: Cardiovascular;  Laterality: N/A;  . Tee without cardioversion N/A 02/01/2015    Procedure: TRANSESOPHAGEAL ECHOCARDIOGRAM (TEE);  Surgeon: Lelon Perla, MD;  Location: [facility] ENDOSCOPY;  Service: Cardiovascular;  Laterality: N/A;  . Cardiac surgery      11/20/20116  . Cardiac catheterization N/A 05/12/2015    Procedure: Left Heart Cath and Coronary Angiography;  Surgeon: Troy Sine, MD;  Location: [place] CV LAB;  Service: Cardiovascular;  Laterality: N/A;  . Cardiac catheterization N/A 05/12/2015    Procedure: Coronary Stent Intervention;  Surgeon: Troy Sine, MD;  Location: [place] CV LAB;  Service: Cardiovascular;  Laterality: N/A;   Family History  Problem Relation Age of Onset  . Heart disease Brother     3 brothers with heart disease (2 also with CVA)  . Hypertension Brother   . Heart disease Mother   . Hypertension Mother   . Stroke Mother   . Stroke Father   . Hypertension Brother   . Stroke Brother    Social History  Substance Use Topics  . Smoking status: Former Smoker -- 0.75 packs/day for 10 years    Types: Cigarettes  . Smokeless tobacco: Never Used     Comment: 01/21/2012 "quit smoking ~ 2002"  . Alcohol Use: 9.0 oz/week    15 Shots of liquor per week     Comment: 01/21/2012 "3 times/wk I have a couple mixed drinks"   OB History    No data available     Review of Systems  Constitutional: Negative for fever and chills.  HENT: Negative for congestion and rhinorrhea.   Eyes: Negative for redness and visual disturbance.  Respiratory: Positive for cough. Negative for shortness of breath and wheezing.   Cardiovascular: Negative for chest pain and palpitations.  Gastrointestinal: Negative for nausea and vomiting.  Genitourinary: Negative for dysuria and urgency.  Musculoskeletal: Negative for myalgias and arthralgias.  Skin: Negative for pallor and wound.  Neurological: Negative for dizziness and headaches.      Allergies  Clonidine derivatives; Plavix; and Exforge  Home Medications   Prior to Admission medications   Medication Sig Start Date End Date Taking? Authorizing Provider  aspirin 81 MG EC tablet Take 81 mg by mouth daily.    Yes Historical Provider, MD  atorvastatin (LIPITOR) 80 MG tablet Take 1 tablet (80 mg total) by mouth daily at 6 PM. 05/15/15  Yes Brittainy Erie Noe, PA-C  cholecalciferol (VITAMIN D) 1000 UNITS tablet Take 1,000 Units by mouth daily.   Yes Historical Provider, MD  citalopram (CELEXA) 20 MG tablet Take 1 tablet (20 mg total) by mouth daily. 12/05/12  Yes Brett Canales, PA-C  clopidogrel (PLAVIX)  75 MG tablet Take 1 tablet (75 mg total) by mouth daily. 05/31/15  Yes Mihai Croitoru, MD  cyanocobalamin 500 MCG tablet Take 1 tablet (500 mcg total) by mouth daily. Patient taking differently: Take 1,000 mcg by mouth daily.  08/08/14  Yes Robyn Haber, MD  isosorbide mononitrate (IMDUR) 30 MG 24 hr tablet Take 1 tablet (30 mg total) by mouth daily. 05/15/15  Yes Brittainy Erie Noe, PA-C  lisinopril (PRINIVIL,ZESTRIL) 10 MG tablet Take 1 tablet (10 mg total) by mouth daily. 05/15/15  Yes Brittainy Erie Noe, PA-C  metoprolol tartrate (LOPRESSOR) 25 MG tablet Take 1 tablet (25 mg total) by mouth 2 (two) times daily. 02/13/15  Yes Rhonda G Barrett, PA-C  nitroGLYCERIN (NITROSTAT) 0.4 MG SL tablet Place 1 tablet (0.4 mg total) under the tongue every 5 (five) minutes x 3 doses as needed for chest pain. 05/15/15  Yes Brittainy Erie Noe, PA-C  vitamin E 400 UNIT capsule Take 400 Units by mouth daily.   Yes Historical Provider, MD   BP 117/59 mmHg  Pulse 75  Temp(Src) 99.7 F (37.6 C) (Oral)  Resp 18  Ht 5' 4.5" (1.638 m)  Wt 136 lb (61.689 kg)  BMI 22.99 kg/m2  SpO2 94% Physical Exam  Constitutional: She is oriented to person, place, and time. She appears well-developed and well-nourished. No distress.  HENT:  Head: Normocephalic and atraumatic.  Eyes: EOM are normal. Pupils are equal, round, and reactive to light.  Neck: Normal range of motion. Neck supple.  Cardiovascular: Normal rate and regular rhythm.  Exam reveals no gallop and no friction rub.   No murmur heard. Pulmonary/Chest: Effort normal. She has no wheezes. She has no rales.  Abdominal: Soft. She exhibits no distension. There is no tenderness. There is no rebound and no guarding.  Musculoskeletal: She exhibits no edema or tenderness.  Neurological: She is alert and oriented to person, place, and time.  Skin: Skin is warm and dry. She is not diaphoretic.  Psychiatric: She has a normal mood and affect. Her behavior is normal.   Nursing note and vitals reviewed.   ED Course  Procedures (including critical care time) Labs Review Labs Reviewed  BASIC METABOLIC PANEL - Abnormal; Notable for the following:    Sodium 134 (*)    Chloride 99 (*)    Glucose, Bld 104 (*)    All other components within normal limits  CBC - Abnormal; Notable for the following:    HCT 35.1 (*)    All other components within normal limits  HEPATIC FUNCTION PANEL - Abnormal; Notable for the following:    Total Protein 6.2 (*)    Albumin 2.8 (*)  All other components within normal limits  BRAIN NATRIURETIC PEPTIDE - Abnormal; Notable for the following:    B Natriuretic Peptide 427.7 (*)    All other components within normal limits  LIPASE, BLOOD  I-STAT TROPOININ, ED  Randolm Idol, ED    Imaging Review Dg Chest 2 View  06/26/2015  CLINICAL DATA:  Cough and chest pain EXAM: CHEST  2 VIEW COMPARISON:  05/11/2015 FINDINGS: Cardiac shadow is stable. Postsurgical changes are noted. A pacing device is again seen. Bilateral breast implants are noted. The lungs are well aerated without focal infiltrate or sizable effusion. No acute bony abnormality is seen. IMPRESSION: No active cardiopulmonary disease. Electronically Signed   By: Inez Catalina M.D.   On: 06/26/2015 11:42   I have personally reviewed and evaluated these images and lab results as part of my medical decision-making.   EKG Interpretation   Date/Time:  Wednesday Jun 26 2015 10:52:20 EDT Ventricular Rate:  76 PR Interval:  199 QRS Duration: 90 QT Interval:  412 QTC Calculation: 463 R Axis:   63 Text Interpretation:  Sinus rhythm Borderline low voltage, extremity leads  Otherwise no significant change Confirmed by Orrin Yurkovich MD, DANIEL [id number]) on  06/26/2015 11:25:54 AM      MDM   Final diagnoses:  SOB (shortness of breath) on exertion    75 yo F with R sided sharp chest pain.  She is not worried about this, is more worried about her exertional dyspnea and  fatigue.  CXR negative for pna.  May be viral in nature.  CXR with possible increased vascularity with kerly B lines, no rales, edema or jvd.  No RUQ tenderness as patients pain Goes to her right shoulder and her back.  Discussed the case with her cardiologist, Dr. Sallyanne Kuster, recommended outpatient follow up.   Delta trop negative.   2:50 PM:  I have discussed the diagnosis/risks/treatment options with the patient and family and believe the pt to be eligible for discharge home to follow-up with PCP. We also discussed returning to the ED immediately if new or worsening sx occur. We discussed the sx which are most concerning (e.g., sudden worsening pain, fever, inability to tolerate by mouth) that necessitate immediate return. Medications administered to the patient during their visit and any new prescriptions provided to the patient are listed below.  Medications given during this visit Medications - No data to display  New Prescriptions   No medications on file    The patient appears reasonably screen and/or stabilized for discharge and I doubt any other medical condition or other [facility] requiring further screening, evaluation, or treatment in the ED at this time prior to discharge.     Deno Etienne, DO 06/26/15 1450

## 2015-06-26 NOTE — ED Notes (Signed)
Pt presents via [facility] from [place] for cough and right sided chest pain x 3 days.  Extensive cardiac hx, bypass in 2016, stents placed in March 2017.  Pt also reports fever. N/V this AM.   Pt reports pain decreases with rest, increased with exertion.   Lung sounds diminished, BP-113/67, P-90 EKG A paced, otherwise unremarkable, O2-92% RA.  324 ASA given,  22g LFA.  A x 4, NAD.

## 2015-06-27 ENCOUNTER — Ambulatory Visit (INDEPENDENT_AMBULATORY_CARE_PROVIDER_SITE_OTHER): Payer: Commercial Managed Care - HMO | Admitting: Family Medicine

## 2015-06-27 ENCOUNTER — Observation Stay (HOSPITAL_COMMUNITY)
Admission: EM | Admit: 2015-06-27 | Discharge: 2015-06-29 | Disposition: A | Discharge status: Home / Self Care | Payer: Commercial Managed Care - HMO | Attending: Internal Medicine | Admitting: Internal Medicine

## 2015-06-27 ENCOUNTER — Telehealth: Payer: Self-pay | Admitting: Cardiovascular Disease

## 2015-06-27 ENCOUNTER — Emergency Department (HOSPITAL_COMMUNITY): Payer: Commercial Managed Care - HMO

## 2015-06-27 ENCOUNTER — Encounter (HOSPITAL_COMMUNITY): Payer: Self-pay

## 2015-06-27 VITALS — BP 108/64 | HR 86 | Temp 102.6°F | Resp 16 | Ht 64.5 in | Wt 131.0 lb

## 2015-06-27 DIAGNOSIS — F329 Major depressive disorder, single episode, unspecified: Secondary | ICD-10-CM | POA: Insufficient documentation

## 2015-06-27 DIAGNOSIS — F1010 Alcohol abuse, uncomplicated: Secondary | ICD-10-CM

## 2015-06-27 DIAGNOSIS — Z95820 Peripheral vascular angioplasty status with implants and grafts: Secondary | ICD-10-CM | POA: Insufficient documentation

## 2015-06-27 DIAGNOSIS — I480 Paroxysmal atrial fibrillation: Secondary | ICD-10-CM

## 2015-06-27 DIAGNOSIS — I5022 Chronic systolic (congestive) heart failure: Secondary | ICD-10-CM | POA: Insufficient documentation

## 2015-06-27 DIAGNOSIS — Z6822 Body mass index (BMI) 22.0-22.9, adult: Secondary | ICD-10-CM | POA: Diagnosis not present

## 2015-06-27 DIAGNOSIS — I110 Hypertensive heart disease with heart failure: Secondary | ICD-10-CM | POA: Insufficient documentation

## 2015-06-27 DIAGNOSIS — J40 Bronchitis, not specified as acute or chronic: Secondary | ICD-10-CM | POA: Diagnosis not present

## 2015-06-27 DIAGNOSIS — R5382 Chronic fatigue, unspecified: Secondary | ICD-10-CM

## 2015-06-27 DIAGNOSIS — Z7902 Long term (current) use of antithrombotics/antiplatelets: Secondary | ICD-10-CM | POA: Diagnosis not present

## 2015-06-27 DIAGNOSIS — Z951 Presence of aortocoronary bypass graft: Secondary | ICD-10-CM

## 2015-06-27 DIAGNOSIS — R0602 Shortness of breath: Secondary | ICD-10-CM | POA: Diagnosis present

## 2015-06-27 DIAGNOSIS — E441 Mild protein-calorie malnutrition: Secondary | ICD-10-CM | POA: Insufficient documentation

## 2015-06-27 DIAGNOSIS — Z7982 Long term (current) use of aspirin: Secondary | ICD-10-CM | POA: Insufficient documentation

## 2015-06-27 DIAGNOSIS — Z87891 Personal history of nicotine dependence: Secondary | ICD-10-CM | POA: Insufficient documentation

## 2015-06-27 DIAGNOSIS — R0902 Hypoxemia: Secondary | ICD-10-CM | POA: Diagnosis not present

## 2015-06-27 DIAGNOSIS — I739 Peripheral vascular disease, unspecified: Secondary | ICD-10-CM

## 2015-06-27 DIAGNOSIS — I252 Old myocardial infarction: Secondary | ICD-10-CM | POA: Insufficient documentation

## 2015-06-27 DIAGNOSIS — I495 Sick sinus syndrome: Secondary | ICD-10-CM | POA: Insufficient documentation

## 2015-06-27 DIAGNOSIS — E8809 Other disorders of plasma-protein metabolism, not elsewhere classified: Secondary | ICD-10-CM | POA: Diagnosis not present

## 2015-06-27 DIAGNOSIS — Z950 Presence of cardiac pacemaker: Secondary | ICD-10-CM

## 2015-06-27 DIAGNOSIS — R509 Fever, unspecified: Secondary | ICD-10-CM | POA: Diagnosis not present

## 2015-06-27 DIAGNOSIS — Z79899 Other long term (current) drug therapy: Secondary | ICD-10-CM | POA: Diagnosis not present

## 2015-06-27 DIAGNOSIS — I2510 Atherosclerotic heart disease of native coronary artery without angina pectoris: Secondary | ICD-10-CM | POA: Diagnosis not present

## 2015-06-27 DIAGNOSIS — R079 Chest pain, unspecified: Secondary | ICD-10-CM | POA: Diagnosis present

## 2015-06-27 DIAGNOSIS — J209 Acute bronchitis, unspecified: Secondary | ICD-10-CM | POA: Diagnosis not present

## 2015-06-27 LAB — URINALYSIS, ROUTINE W REFLEX MICROSCOPIC
Bilirubin Urine: NEGATIVE
Glucose, UA: NEGATIVE mg/dL
Hgb urine dipstick: NEGATIVE
Ketones, ur: NEGATIVE mg/dL
Nitrite: NEGATIVE
Protein, ur: NEGATIVE mg/dL
Specific Gravity, Urine: 1.019 (ref 1.005–1.030)
pH: 6 (ref 5.0–8.0)

## 2015-06-27 LAB — COMPREHENSIVE METABOLIC PANEL
ALT: 22 U/L (ref 14–54)
AST: 25 U/L (ref 15–41)
Albumin: 2.6 g/dL — ABNORMAL LOW (ref 3.5–5.0)
Alkaline Phosphatase: 169 U/L — ABNORMAL HIGH (ref 38–126)
Anion gap: 7 (ref 5–15)
BUN: 15 mg/dL (ref 6–20)
CO2: 24 mmol/L (ref 22–32)
Calcium: 10 mg/dL (ref 8.9–10.3)
Chloride: 103 mmol/L (ref 101–111)
Creatinine, Ser: 0.95 mg/dL (ref 0.44–1.00)
GFR calc Af Amer: >60 mL/min (ref >60)
GFR calc non Af Amer: 57 mL/min — ABNORMAL LOW (ref >60)
Glucose, Bld: 131 mg/dL — ABNORMAL HIGH (ref 65–99)
Potassium: 4.3 mmol/L (ref 3.5–5.1)
Sodium: 134 mmol/L — ABNORMAL LOW (ref 135–145)
Total Bilirubin: 0.4 mg/dL (ref 0.3–1.2)
Total Protein: 6 g/dL — ABNORMAL LOW (ref 6.5–8.1)

## 2015-06-27 LAB — CBC WITH DIFFERENTIAL/PLATELET
Basophils Absolute: 0 10*3/uL (ref 0.0–0.1)
Basophils Relative: 1 %
Eosinophils Absolute: 0.2 10*3/uL (ref 0.0–0.7)
Eosinophils Relative: 3 %
HCT: 33.5 % — ABNORMAL LOW (ref 36.0–46.0)
Hemoglobin: 11.1 g/dL — ABNORMAL LOW (ref 12.0–15.0)
Lymphocytes Relative: 11 %
Lymphs Abs: 0.7 10*3/uL (ref 0.7–4.0)
MCH: 28 pg (ref 26.0–34.0)
MCHC: 33.1 g/dL (ref 30.0–36.0)
MCV: 84.6 fL (ref 78.0–100.0)
Monocytes Absolute: 0.7 10*3/uL (ref 0.1–1.0)
Monocytes Relative: 10 %
Neutro Abs: 4.8 10*3/uL (ref 1.7–7.7)
Neutrophils Relative %: 75 %
Platelets: 199 10*3/uL (ref 150–400)
RBC: 3.96 MIL/uL (ref 3.87–5.11)
RDW: 14.2 % (ref 11.5–15.5)
WBC: 6.4 10*3/uL (ref 4.0–10.5)

## 2015-06-27 LAB — I-STAT TROPONIN, ED: Troponin i, poc: 0 ng/mL (ref 0.00–0.08)

## 2015-06-27 LAB — URINE MICROSCOPIC-ADD ON: RBC / HPF: NONE SEEN RBC/hpf (ref 0–5)

## 2015-06-27 MED ORDER — DEXTROSE 5 % IV SOLN
500.0000 mg | Freq: Once | INTRAVENOUS | Status: AC
  Administered 2015-06-27: 500 mg via INTRAVENOUS
  Filled 2015-06-27: qty 500

## 2015-06-27 NOTE — ED Notes (Signed)
Pt arrived via [facility] c/o cough and chest pain x1 week.  Saw PCP yesterday and came to the ED, went to urgent care today and transferred to the ED.  Pt reports fever at home.

## 2015-06-27 NOTE — ED Provider Notes (Signed)
CSN: [id number]     Arrival date & time 06/27/15  1829 History   First MD Initiated Contact with Patient 06/27/15 1830     Chief Complaint  Patient presents with  . Chest Pain  . Cough   (Consider location/radiation/quality/duration/timing/severity/associated sxs/prior Treatment) Patient is a 75 y.o. female presenting with chest pain and cough. The history is provided by the patient and medical records. No language interpreter was used.  Chest Pain Associated symptoms: cough, fatigue, fever and shortness of breath   Associated symptoms: no abdominal pain, no dizziness, no headache, no nausea, no palpitations and not vomiting   Cough Associated symptoms: fever, myalgias and shortness of breath   Associated symptoms: no chest pain, no headaches, no rash and no sore throat      [name] is a 75 y.o. female  with a PMH of significant cardiac history (see below) including pacemaker placement and stent placement who presents to the Emergency Department complaining of persistent shortness of breath x 1 week. Patient states she has been experiencing fatigue, dyspnea worse with exertion, dry cough, and fever (tmax 103.6 at home) x 1 week. Denies chest pain, lower extremity pain/swelling. Ibuprofen taken at home which relieved fever temporarily. No other medications taken prior to arrival for symptoms.   Past Medical History  Diagnosis Date  . GLUCOSE INTOLERANCE 12/31/2009  . HYPERLIPIDEMIA 12/31/2009  . ANXIETY 12/31/2009  . DEPRESSION 12/31/2009  . MITRAL REGURGITATION 12/31/2009  . HYPERTENSION 12/31/2009  . CORONARY ARTERY DISEASE 12/31/2009  . MITRAL VALVE PROLAPSE 12/31/2009  . MENOPAUSE, EARLY 12/31/2009  . Unspecified urinary incontinence 12/31/2009  . ABDOMINAL PAIN, LOWER 12/31/2009  . DIVERTICULITIS, HX OF 12/31/2009  . Pacemaker 01/21/2012    MDT Adapta dual chamber  . Tachycardia-bradycardia syndrome ([place])     [name] 01/21/2012  . PAD (peripheral artery disease) ([place])    . Heart murmur     "used to say I did" (01/21/2012)  . OSTEOARTHRITIS, HAND 12/31/2009  . [place] DISEASE, CERVICAL 12/31/2009  . [place] DISEASE, LUMBAR 12/31/2009  . Symptomatic sinus bradycardia 01/22/2012  . PAF (paroxysmal atrial fibrillation) ([place]) 01/22/2012  . Sinus node dysfunction ([place]) 01/22/2012  . PVD (peripheral vascular disease), hx stents to bil SFAs 02/2010 01/22/2012  . PAD (peripheral artery disease) ([place])   . Atrial fibrillation with RVR ([place])   . NSTEMI (non-ST elevated myocardial infarction) ([place]) 05/11/2015   Past Surgical History  Procedure Laterality Date  . Cardiac catheterization  2005    "negative" (01/21/2012)  . Oophorectomy  ~1979  . Partial colectomy  2010  . Carpal tunnel release  2008    "right hand/thumb; carpal tunnel repair; got rid of arthritis" (01/21/2012)  . Insert / replace / remove pacemaker  01/21/2012    initial placement  . Vaginal hysterectomy  1975  . Posterior cervical laminectomy  1985  . Peripheral arterial stent graft  2012; 2012    "LLE; RLE" (01/21/2012)  . Facelift, lower 2/3  1995    "mini" (01/21/2012)  . Augmentation mammaplasty  1983  . Pacemaker insertion  01/21/2012    Medtronic Adapta, model# ADDRL1, serial# [id number]  . Lower arterial examination  10/28/2011    R. SFA stent mild-moderate mixed density plaque with elevated velocities consistent with 50% diameter reduction. L. SFA stent moderate mixed denisty plaque at mid to distal level consistent with 50-69% diameter reduction.  . Cardiac catheterization  09/17/1999    Mild CAD involving proximal portion of LAD. Mitral valve prolapse w/ mitral regurg.  No evidence of renal artery stenosis. Recommended readjustment of antihypertensive medications.  . Persantine myoview stress test  02/25/2010    No scintigraphic evidence of inducible myocardial ischemia. No persantine EKG changes. Non-diagnositc for ishcemia. Low-risk, normal scan.  Marland Kitchen Permanent pacemaker insertion N/A 01/21/2012     Procedure: PERMANENT PACEMAKER INSERTION;  Surgeon: Sanda Klein, MD;  Location: [place] CATH LAB;  Service: Cardiovascular;  Laterality: N/A;  . Cardiac catheterization N/A 01/01/2015    Procedure: Left Heart Cath and Coronary Angiography;  Surgeon: Jettie Booze, MD;  Location: [place] CV LAB;  Service: Cardiovascular;  Laterality: N/A;  . Coronary artery bypass graft N/A 01/04/2015    Procedure: CORONARY ARTERY BYPASS GRAFTING (CABG) x 3 using left internal mammory artery and greater saphenous vein right leg harvested endoscopically.;  Surgeon: Grace Isaac, MD;  LIMA-LAD, SVG-RI, SVG-PDA  . Tee without cardioversion N/A 01/04/2015    Procedure: TRANSESOPHAGEAL ECHOCARDIOGRAM (TEE);  Surgeon: Grace Isaac, MD;  Location: [facility];  Service: Open Heart Surgery;  Laterality: N/A;  . Clipping of atrial appendage N/A 01/04/2015    Procedure: CLIPPING OF ATRIAL APPENDAGE;  Surgeon: Grace Isaac, MD;  Location: [place];  Service: Open Heart Surgery;  Laterality: N/A;  . Cardioversion N/A 02/01/2015    Procedure: CARDIOVERSION;  Surgeon: Lelon Perla, MD;  Location: [facility] ENDOSCOPY;  Service: Cardiovascular;  Laterality: N/A;  . Tee without cardioversion N/A 02/01/2015    Procedure: TRANSESOPHAGEAL ECHOCARDIOGRAM (TEE);  Surgeon: Lelon Perla, MD;  Location: [facility] ENDOSCOPY;  Service: Cardiovascular;  Laterality: N/A;  . Cardiac surgery      11/20/20116  . Cardiac catheterization N/A 05/12/2015    Procedure: Left Heart Cath and Coronary Angiography;  Surgeon: Troy Sine, MD;  Location: [place] CV LAB;  Service: Cardiovascular;  Laterality: N/A;  . Cardiac catheterization N/A 05/12/2015    Procedure: Coronary Stent Intervention;  Surgeon: Troy Sine, MD;  Location: [place] CV LAB;  Service: Cardiovascular;  Laterality: N/A;   Family History  Problem Relation Age of Onset  . Heart disease Brother     3 brothers with heart disease (2 also with CVA)  . Hypertension  Brother   . Heart disease Mother   . Hypertension Mother   . Stroke Mother   . Stroke Father   . Hypertension Brother   . Stroke Brother    Social History  Substance Use Topics  . Smoking status: Former Smoker -- 0.75 packs/day for 10 years    Types: Cigarettes  . Smokeless tobacco: Never Used     Comment: 01/21/2012 "quit smoking ~ 2002"  . Alcohol Use: 9.0 oz/week    15 Shots of liquor per week     Comment: 01/21/2012 "3 times/wk I have a couple mixed drinks"   OB History    No data available     Review of Systems  Constitutional: Positive for fever and fatigue.  HENT: Negative for congestion and sore throat.   Eyes: Negative for visual disturbance.  Respiratory: Positive for cough and shortness of breath.   Cardiovascular: Negative for chest pain, palpitations and leg swelling.  Gastrointestinal: Negative for nausea, vomiting and abdominal pain.  Genitourinary: Negative for dysuria.  Musculoskeletal: Positive for myalgias. Negative for neck pain.  Skin: Negative for rash.  Neurological: Negative for dizziness and headaches.      Allergies  Clonidine derivatives; Plavix; and Exforge  Home Medications   Prior to Admission medications   Medication Sig Start Date End  Date Taking? Authorizing Provider  aspirin 81 MG EC tablet Take 81 mg by mouth daily.     Historical Provider, MD  atorvastatin (LIPITOR) 80 MG tablet Take 1 tablet (80 mg total) by mouth daily at 6 PM. 05/15/15   Brittainy Erie Noe, PA-C  cholecalciferol (VITAMIN D) 1000 UNITS tablet Take 1,000 Units by mouth daily.    Historical Provider, MD  citalopram (CELEXA) 20 MG tablet Take 1 tablet (20 mg total) by mouth daily. 12/05/12   Brett Canales, PA-C  clopidogrel (PLAVIX) 75 MG tablet Take 1 tablet (75 mg total) by mouth daily. 05/31/15   Mihai Croitoru, MD  cyanocobalamin 500 MCG tablet Take 1 tablet (500 mcg total) by mouth daily. Patient taking differently: Take 1,000 mcg by mouth daily.  08/08/14   Robyn Haber, MD  isosorbide mononitrate (IMDUR) 30 MG 24 hr tablet Take 1 tablet (30 mg total) by mouth daily. 05/15/15   Brittainy Erie Noe, PA-C  lisinopril (PRINIVIL,ZESTRIL) 10 MG tablet Take 1 tablet (10 mg total) by mouth daily. 05/15/15   Brittainy Erie Noe, PA-C  metoprolol tartrate (LOPRESSOR) 25 MG tablet Take 1 tablet (25 mg total) by mouth 2 (two) times daily. 02/13/15   Rhonda G Barrett, PA-C  nitroGLYCERIN (NITROSTAT) 0.4 MG SL tablet Place 1 tablet (0.4 mg total) under the tongue every 5 (five) minutes x 3 doses as needed for chest pain. 05/15/15   Brittainy Erie Noe, PA-C  vitamin E 400 UNIT capsule Take 400 Units by mouth daily.    Historical Provider, MD   BP 109/45 mmHg  Pulse 79  Temp(Src) 100.1 F (37.8 C) (Oral)  Resp 16  SpO2 96% Physical Exam  Constitutional: She is oriented to person, place, and time. She appears well-developed and well-nourished.  HENT:  Head: Normocephalic and atraumatic.  OP with erythema, no exudates or hypertrophy.   Cardiovascular: Normal rate, regular rhythm and normal heart sounds.  Exam reveals no gallop and no friction rub.   No murmur heard. Pulmonary/Chest:  Increased effort in breathing. 91-96% on RA.  Abdominal: Soft. She exhibits no distension. There is no tenderness.  Musculoskeletal: She exhibits no edema.  Neurological: She is alert and oriented to person, place, and time.  Skin: Skin is warm and dry. She is not diaphoretic.  Nursing note and vitals reviewed.   ED Course  Procedures (including critical care time) Labs Review Labs Reviewed  CBC WITH DIFFERENTIAL/PLATELET  COMPREHENSIVE METABOLIC PANEL  Randolm Idol, ED    Imaging Review Dg Chest 2 View  06/26/2015  CLINICAL DATA:  Cough and chest pain EXAM: CHEST  2 VIEW COMPARISON:  05/11/2015 FINDINGS: Cardiac shadow is stable. Postsurgical changes are noted. A pacing device is again seen. Bilateral breast implants are noted. The lungs are well aerated without focal  infiltrate or sizable effusion. No acute bony abnormality is seen. IMPRESSION: No active cardiopulmonary disease. Electronically Signed   By: Inez Catalina M.D.   On: 06/26/2015 11:42   I have personally reviewed and evaluated these images and lab results as part of my medical decision-making.   EKG Interpretation   Date/Time:  Thursday Jun 27 2015 19:24:52 EDT Ventricular Rate:  81 PR Interval:  192 QRS Duration: 92 QT Interval:  388 QTC Calculation: 450 R Axis:   81 Text Interpretation:  Sinus rhythm Borderline right axis deviation  Borderline repolarization abnormality Baseline wander in lead(s) I III aVL  aVF V4 V5 V6 Confirmed by BEATON  MD, ROBERT ([id number]) on 06/27/2015  7:43:08  PM      MDM   Final diagnoses:  Shortness of breath   [name] presents to ED for dyspnea on exertion, fatigue, and fever x 1 week. Seen in ED for same yesterday where CXR was obtained without signs of PNA. Cardiology consulted recommending outpatient follow up. Patient discharged. Went to PCP today who sent patient to ED for further evaluation.   Labs: CBC reassuring, trop 0.0 Imaging: CXR shows no acute findings.   CMP pending at shift change.  Care assumed by oncoming provider PA [facility], case discussed, plan agreed upon. Will follow up on CMP results with likely admission.  Patient seen by and discussed with Dr. Audie Pinto who agrees with treatment plan.   [facility] Ward, PA-C 06/27/15 2023  Leonard Schwartz, MD 06/27/15 2026

## 2015-06-27 NOTE — ED Notes (Signed)
Graham crackers, pb, applesauce, and coke provided per sign and held orders conveyed by  [employer]

## 2015-06-27 NOTE — Progress Notes (Signed)
[facility] [address], [place] [address] [phone]  Date:  06/27/2015   Name:  [name]   DOB:  [birth year]   MRN:  [id number]  PCP:  [name], MD    History of Present Illness:  [name] is a 75 y.o. female patient who presents to [facility] for cc of cc of sob and dizziness.   Patient is here for cc of sob, fever,  and cough, and dizziness for the last 3 days that has progressively worsened.  She was seen by [facility], and transported to [facility].  Various labs revealed cbc wnl, cxr normal.  Negative troponins.  She has had intermittent fevers which she is currently taking ibuprofen.  She reports that she continues to have dizziness and sob.  She his hydrating well with water.  She has no new lower extremity leg pain.  Cough is non-productive.   Troponin levels normal at visit.  She had O2 sat from 89%-92% from the [place] physicians to ED.   Cbc wnl.  No anuria, hematuria, dysuria, or frequency.  No hx of thyroid disease.    Patient Active Problem List   Diagnosis Date Noted  . Stented coronary artery   . Chest pain 05/11/2015  . NSTEMI (non-ST elevated myocardial infarction) ([place]) 05/11/2015  . Atrial fibrillation with RVR ([place]) 01/11/2015  . S/P CABG x 3 01/04/15 01/10/2015  . CAD (coronary artery disease) 01/04/2015  . Accelerating angina ([place]) 01/01/2015  . Unstable angina pectoris ([place]) 12/28/2014  . Symptomatic sinus bradycardia 01/22/2012  . PAF (paroxysmal atrial fibrillation) ([place]) 01/22/2012  . Sinus node dysfunction ([place]) 01/22/2012  . S/P placement of cardiac pacemaker, medtronic adapta 01/21/12 01/22/2012  . PVD (peripheral vascular disease), hx stents to bil SFAs 02/2010 01/22/2012  . GLUCOSE INTOLERANCE 12/31/2009  . Hyperlipidemia 12/31/2009  . Anxiety 12/31/2009  . DEPRESSION 12/31/2009  . MITRAL REGURGITATION 12/31/2009  . Essential hypertension 12/31/2009  . Coronary atherosclerosis 12/31/2009  . MITRAL VALVE  PROLAPSE 12/31/2009  . MENOPAUSE, EARLY 12/31/2009  . OSTEOARTHRITIS, HAND 12/31/2009  . [place] DISEASE, CERVICAL 12/31/2009  . [place] DISEASE, LUMBAR 12/31/2009  . UNSPECIFIED URINARY INCONTINENCE 12/31/2009  . ABDOMINAL PAIN, LOWER 12/31/2009  . DIVERTICULITIS, HX OF 12/31/2009    Past Medical History  Diagnosis Date  . GLUCOSE INTOLERANCE 12/31/2009  . HYPERLIPIDEMIA 12/31/2009  . ANXIETY 12/31/2009  . DEPRESSION 12/31/2009  . MITRAL REGURGITATION 12/31/2009  . HYPERTENSION 12/31/2009  . CORONARY ARTERY DISEASE 12/31/2009  . MITRAL VALVE PROLAPSE 12/31/2009  . MENOPAUSE, EARLY 12/31/2009  . Unspecified urinary incontinence 12/31/2009  . ABDOMINAL PAIN, LOWER 12/31/2009  . DIVERTICULITIS, HX OF 12/31/2009  . Pacemaker 01/21/2012    MDT Adapta dual chamber  . Tachycardia-bradycardia syndrome ([place])     [name] 01/21/2012  . PAD (peripheral artery disease) ([place])   . Heart murmur     "used to say I did" (01/21/2012)  . OSTEOARTHRITIS, HAND 12/31/2009  . [place] DISEASE, CERVICAL 12/31/2009  . [place] DISEASE, LUMBAR 12/31/2009  . Symptomatic sinus bradycardia 01/22/2012  . PAF (paroxysmal atrial fibrillation) ([place]) 01/22/2012  . Sinus node dysfunction ([place]) 01/22/2012  . PVD (peripheral vascular disease), hx stents to bil SFAs 02/2010 01/22/2012  . PAD (peripheral artery disease) ([place])   . Atrial fibrillation with RVR ([place])   . NSTEMI (non-ST elevated myocardial infarction) ([place]) 05/11/2015    Past Surgical History  Procedure Laterality Date  . Cardiac catheterization  2005    "negative" (01/21/2012)  .  Oophorectomy  ~1979  . Partial colectomy  2010  . Carpal tunnel release  2008    "right hand/thumb; carpal tunnel repair; got rid of arthritis" (01/21/2012)  . Insert / replace / remove pacemaker  01/21/2012    initial placement  . Vaginal hysterectomy  1975  . Posterior cervical laminectomy  1985  . Peripheral arterial stent graft  2012; 2012    "LLE; RLE" (01/21/2012)  .  Facelift, lower 2/3  1995    "mini" (01/21/2012)  . Augmentation mammaplasty  1983  . Pacemaker insertion  01/21/2012    Medtronic Adapta, model# ADDRL1, serial# [id number]  . Lower arterial examination  10/28/2011    R. SFA stent mild-moderate mixed density plaque with elevated velocities consistent with 50% diameter reduction. L. SFA stent moderate mixed denisty plaque at mid to distal level consistent with 50-69% diameter reduction.  . Cardiac catheterization  09/17/1999    Mild CAD involving proximal portion of LAD. Mitral valve prolapse w/ mitral regurg. No evidence of renal artery stenosis. Recommended readjustment of antihypertensive medications.  . Persantine myoview stress test  02/25/2010    No scintigraphic evidence of inducible myocardial ischemia. No persantine EKG changes. Non-diagnositc for ishcemia. Low-risk, normal scan.  Marland Kitchen Permanent pacemaker insertion N/A 01/21/2012    Procedure: PERMANENT PACEMAKER INSERTION;  Surgeon: Sanda Klein, MD;  Location: [place] CATH LAB;  Service: Cardiovascular;  Laterality: N/A;  . Cardiac catheterization N/A 01/01/2015    Procedure: Left Heart Cath and Coronary Angiography;  Surgeon: Jettie Booze, MD;  Location: [place] CV LAB;  Service: Cardiovascular;  Laterality: N/A;  . Coronary artery bypass graft N/A 01/04/2015    Procedure: CORONARY ARTERY BYPASS GRAFTING (CABG) x 3 using left internal mammory artery and greater saphenous vein right leg harvested endoscopically.;  Surgeon: Grace Isaac, MD;  LIMA-LAD, SVG-RI, SVG-PDA  . Tee without cardioversion N/A 01/04/2015    Procedure: TRANSESOPHAGEAL ECHOCARDIOGRAM (TEE);  Surgeon: Grace Isaac, MD;  Location: [place];  Service: Open Heart Surgery;  Laterality: N/A;  . Clipping of atrial appendage N/A 01/04/2015    Procedure: CLIPPING OF ATRIAL APPENDAGE;  Surgeon: Grace Isaac, MD;  Location: [place];  Service: Open Heart Surgery;  Laterality: N/A;  . Cardioversion N/A 02/01/2015     Procedure: CARDIOVERSION;  Surgeon: Lelon Perla, MD;  Location: [facility] ENDOSCOPY;  Service: Cardiovascular;  Laterality: N/A;  . Tee without cardioversion N/A 02/01/2015    Procedure: TRANSESOPHAGEAL ECHOCARDIOGRAM (TEE);  Surgeon: Lelon Perla, MD;  Location: [facility] ENDOSCOPY;  Service: Cardiovascular;  Laterality: N/A;  . Cardiac surgery      11/20/20116  . Cardiac catheterization N/A 05/12/2015    Procedure: Left Heart Cath and Coronary Angiography;  Surgeon: Troy Sine, MD;  Location: [place] CV LAB;  Service: Cardiovascular;  Laterality: N/A;  . Cardiac catheterization N/A 05/12/2015    Procedure: Coronary Stent Intervention;  Surgeon: Troy Sine, MD;  Location: [place] CV LAB;  Service: Cardiovascular;  Laterality: N/A;    Social History  Substance Use Topics  . Smoking status: Former Smoker -- 0.75 packs/day for 10 years    Types: Cigarettes  . Smokeless tobacco: Never Used     Comment: 01/21/2012 "quit smoking ~ 2002"  . Alcohol Use: 9.0 oz/week    15 Shots of liquor per week     Comment: 01/21/2012 "3 times/wk I have a couple mixed drinks"    Family History  Problem Relation Age of Onset  . Heart disease Brother  3 brothers with heart disease (2 also with CVA)  . Hypertension Brother   . Heart disease Mother   . Hypertension Mother   . Stroke Mother   . Stroke Father   . Hypertension Brother   . Stroke Brother     Allergies  Allergen Reactions  . Clonidine Derivatives Other (See Comments)    Lowers heart rate  . Plavix [Clopidogrel Bisulfate] Other (See Comments)    Joint pains  . Exforge [Amlodipine Besylate-Valsartan] Itching and Rash    Medication list has been reviewed and updated.  Current Outpatient Prescriptions on File Prior to Visit  Medication Sig Dispense Refill  . aspirin 81 MG EC tablet Take 81 mg by mouth daily.     Marland Kitchen atorvastatin (LIPITOR) 80 MG tablet Take 1 tablet (80 mg total) by mouth daily at 6 PM. 30 tablet 5  .  cholecalciferol (VITAMIN D) 1000 UNITS tablet Take 1,000 Units by mouth daily.    . citalopram (CELEXA) 20 MG tablet Take 1 tablet (20 mg total) by mouth daily. 90 tablet 0  . clopidogrel (PLAVIX) 75 MG tablet Take 1 tablet (75 mg total) by mouth daily. 90 tablet 1  . cyanocobalamin 500 MCG tablet Take 1 tablet (500 mcg total) by mouth daily. (Patient taking differently: Take 1,000 mcg by mouth daily. ) 90 tablet 3  . isosorbide mononitrate (IMDUR) 30 MG 24 hr tablet Take 1 tablet (30 mg total) by mouth daily. 30 tablet 5  . lisinopril (PRINIVIL,ZESTRIL) 10 MG tablet Take 1 tablet (10 mg total) by mouth daily. 30 tablet 5  . metoprolol tartrate (LOPRESSOR) 25 MG tablet Take 1 tablet (25 mg total) by mouth 2 (two) times daily. 180 tablet 1  . nitroGLYCERIN (NITROSTAT) 0.4 MG SL tablet Place 1 tablet (0.4 mg total) under the tongue every 5 (five) minutes x 3 doses as needed for chest pain. 25 tablet 2  . vitamin E 400 UNIT capsule Take 400 Units by mouth daily.     No current facility-administered medications on file prior to visit.    ROS ROS otherwise unremarkable unless listed above.   Physical Examination: BP 108/64 mmHg  Pulse 86  Temp(Src) 102.6 F (39.2 C) (Oral)  Resp 16  Ht 5' 4.5" (1.638 m)  Wt 131 lb (59.421 kg)  BMI 22.15 kg/m2  SpO2 89% Ideal Body Weight: Weight in (lb) to have BMI = 25: 147.6  Physical Exam  Constitutional: She is oriented to person, place, and time. She appears well-developed and well-nourished. No distress.  HENT:  Head: Normocephalic and atraumatic.  Right Ear: External ear normal.  Left Ear: External ear normal.  Eyes: Conjunctivae and EOM are normal. Pupils are equal, round, and reactive to light.  Cardiovascular: Normal rate.  Exam reveals no gallop and no friction rub.   No murmur heard. Pulses:      Radial pulses are 2+ on the right side, and 2+ on the left side.       Dorsalis pedis pulses are 2+ on the right side, and 2+ on the left side.   Pulmonary/Chest: Accessory muscle usage present. She is in respiratory distress (intermittent to minimally fragmented sentences.  ). She has no decreased breath sounds. She has no wheezes. She has no rhonchi. She has rales.  Abdominal: Soft. Normal appearance and bowel sounds are normal. There is no hepatosplenomegaly. There is no tenderness. There is no tenderness at McBurney's point and negative Murphy's sign.  Neurological: She is alert and oriented to  person, place, and time.  Skin: She is not diaphoretic.  Psychiatric: She has a normal mood and affect. Her behavior is normal.   Influenza test: negative for both A and B   Assessment and Plan: [name] is a 75 y.o. female who is here today for cc of sob, dyspnea, and cough. Diff dx: myocarditis, pericarditis, PE, viral exacerbation, cardiomyopathy, acute thyrotoxicosis. Transport by EMS.  Likely need to rule out PE with CTA, and also do an echo.  Viral respiratory panel may be necessary as well.   1. SOB (shortness of breath) - EKG 12-Lead   Ivar Drape, PA-C [facility] 06/27/2015 5:31 PM

## 2015-06-27 NOTE — ED Notes (Signed)
Ambulated patient with assistance with pulse ox on finger. Pulse ox 92% - 94% during ambulation. Gait slow, steady.

## 2015-06-27 NOTE — ED Provider Notes (Signed)
8:57 PM Handoff from Ward PA-C at shift change. Patient with 2 weeks of progressive shortness of breath, worse with activity, fever and cough for the past 1 week to 103F at home. Patient is hypoxic into the upper 80% range on room air in emergency department at rest. Chest x-ray shows bronchitic changes. Normal white count. Given degree of symptoms, will admit to the hospital. Patient started on azithromycin.  Spoke with Dr. Jonnie Finner who will admit.   BP 131/65 mmHg  Pulse 76  Temp(Src) 100.1 F (37.8 C) (Oral)  Resp 17  SpO2 94%   Carlisle Cater, PA-C 06/27/15 2120  Leonard Schwartz, MD 06/27/15 2125

## 2015-06-27 NOTE — H&P (Addendum)
[facility] History and Physical  [name] [id number] DOB: 1940/09/14 DOA: 06/27/2015  Referring physician: Dr Audie Pinto PCP: [name], MD   Chief Complaint: Cough and fevers/ malaise  HPI: [name] is a 76 y.o. female with hx of PVD and CAD sp CABG Nov 2016 presenting to ED with 1-2 weeks of dry cough and recurrent fevers.  Temps to 102-103 for 7-10 days, no outpt abx, thought it may be a virus and hoping it would pass.  Had n/v/d for one day but none presently.  No abd pain, sore throat, ear pain or HA, no dysuria or cloudy urine, no prod cough, no rash or ulcers.  SaO2 in ED was dropping into 80's,. CXR clear.  Asked to admit for recurrent fevers and hypoxemia.    Patient's main concern is not the fevers but severe fatigue and malaise that predates this febrile illness, goes back almost to her CABG last year November. Doesn't have energy to do anything, is not going to the gym like pre op before the CABG.  Not improved after stent x 2 in April this year.  Severe "weakness " in her thighs and legs and she spoke w her cardiologist yesterday who said to stop her statin because of this.  No claudication.  +hx bilat SFA stents 2-3 yrs ago.    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  Never smoked but drinks rather heavily, 2-3 "large" vodka drinks per day.  Has a living will.  Lives w her husband who is her today.   No DM, hypertension 30-40 yrs.      Old chart Sept '14 - acute diverticulitis, recurrent > elective lap assist left colectomy, also HTN, postop ileus Jan '12 - claudication > bilat SFA stenting, also HTN/ HL Dec '13 - paroxysmal afib/ sinus node dysfxn/ bradycardia, MVP/ CAD nonsig disease at cath 2001 > underwent placement of PPM Nov '16 - progressive DOE > severe 3VCAD by cath, had CABG w LIMA/ LAD, SVG to ramus intermedius and PDA. Postop afib. Xarelto for afib.  Dec '16 - afib with RVR, hx PPM.  TEE /  DCCV, no thrombus, had DCCV and maintained NSR.  DC'd on same dose of metoprolol 25 bid.   April '17 - chest pain /unstable angina, +NSTEMI > went for cath which showed patent LIMA to LAD but severely diseased SVG grafts x 2.  Had successful PCI to the SVG to distal RCA and PCI to prox native RCA.  Medical Rx selected for occluded SVG to ramus vessel, consider attempt at PCI if recurrent angina.     ROS  denies CP  no joint pain   no HA  no blurry vision  no rash  no diarrhea  no nausea/ vomiting  no dysuria  no difficulty voiding  no change in urine color    Where does patient live home Can patient participate in ADLs? yes  Past Medical History  Past Medical History  Diagnosis Date  . GLUCOSE INTOLERANCE 12/31/2009  . HYPERLIPIDEMIA 12/31/2009  . ANXIETY 12/31/2009  . DEPRESSION 12/31/2009  . MITRAL REGURGITATION 12/31/2009  . HYPERTENSION 12/31/2009  . CORONARY ARTERY DISEASE 12/31/2009  . MITRAL VALVE PROLAPSE 12/31/2009  . MENOPAUSE, EARLY 12/31/2009  . Unspecified urinary incontinence 12/31/2009  . ABDOMINAL PAIN, LOWER 12/31/2009  . DIVERTICULITIS, HX OF 12/31/2009  . Pacemaker 01/21/2012    MDT Adapta dual chamber  . Tachycardia-bradycardia syndrome ([place])     /  notes 01/21/2012  . PAD (peripheral artery disease) ([place])   . Heart murmur     "used to say I did" (01/21/2012)  . OSTEOARTHRITIS, HAND 12/31/2009  . [place] DISEASE, CERVICAL 12/31/2009  . [place] DISEASE, LUMBAR 12/31/2009  . Symptomatic sinus bradycardia 01/22/2012  . PAF (paroxysmal atrial fibrillation) ([place]) 01/22/2012  . Sinus node dysfunction ([place]) 01/22/2012  . PVD (peripheral vascular disease), hx stents to bil SFAs 02/2010 01/22/2012  . PAD (peripheral artery disease) ([place])   . Atrial fibrillation with RVR ([place])   . NSTEMI (non-ST elevated myocardial infarction) ([place]) 05/11/2015   Past Surgical History  Past Surgical History  Procedure Laterality Date  . Cardiac catheterization  2005     "negative" (01/21/2012)  . Oophorectomy  ~1979  . Partial colectomy  2010  . Carpal tunnel release  2008    "right hand/thumb; carpal tunnel repair; got rid of arthritis" (01/21/2012)  . Insert / replace / remove pacemaker  01/21/2012    initial placement  . Vaginal hysterectomy  1975  . Posterior cervical laminectomy  1985  . Peripheral arterial stent graft  2012; 2012    "LLE; RLE" (01/21/2012)  . Facelift, lower 2/3  1995    "mini" (01/21/2012)  . Augmentation mammaplasty  1983  . Pacemaker insertion  01/21/2012    Medtronic Adapta, model# ADDRL1, serial# [id number]  . Lower arterial examination  10/28/2011    R. SFA stent mild-moderate mixed density plaque with elevated velocities consistent with 50% diameter reduction. L. SFA stent moderate mixed denisty plaque at mid to distal level consistent with 50-69% diameter reduction.  . Cardiac catheterization  09/17/1999    Mild CAD involving proximal portion of LAD. Mitral valve prolapse w/ mitral regurg. No evidence of renal artery stenosis. Recommended readjustment of antihypertensive medications.  . Persantine myoview stress test  02/25/2010    No scintigraphic evidence of inducible myocardial ischemia. No persantine EKG changes. Non-diagnositc for ishcemia. Low-risk, normal scan.  Marland Kitchen Permanent pacemaker insertion N/A 01/21/2012    Procedure: PERMANENT PACEMAKER INSERTION;  Surgeon: Sanda Klein, MD;  Location: [place] CATH LAB;  Service: Cardiovascular;  Laterality: N/A;  . Cardiac catheterization N/A 01/01/2015    Procedure: Left Heart Cath and Coronary Angiography;  Surgeon: Jettie Booze, MD;  Location: [place] CV LAB;  Service: Cardiovascular;  Laterality: N/A;  . Coronary artery bypass graft N/A 01/04/2015    Procedure: CORONARY ARTERY BYPASS GRAFTING (CABG) x 3 using left internal mammory artery and greater saphenous vein right leg harvested endoscopically.;  Surgeon: Grace Isaac, MD;  LIMA-LAD, SVG-RI, SVG-PDA  . Tee  without cardioversion N/A 01/04/2015    Procedure: TRANSESOPHAGEAL ECHOCARDIOGRAM (TEE);  Surgeon: Grace Isaac, MD;  Location: [facility];  Service: Open Heart Surgery;  Laterality: N/A;  . Clipping of atrial appendage N/A 01/04/2015    Procedure: CLIPPING OF ATRIAL APPENDAGE;  Surgeon: Grace Isaac, MD;  Location: [place];  Service: Open Heart Surgery;  Laterality: N/A;  . Cardioversion N/A 02/01/2015    Procedure: CARDIOVERSION;  Surgeon: Lelon Perla, MD;  Location: [facility] ENDOSCOPY;  Service: Cardiovascular;  Laterality: N/A;  . Tee without cardioversion N/A 02/01/2015    Procedure: TRANSESOPHAGEAL ECHOCARDIOGRAM (TEE);  Surgeon: Lelon Perla, MD;  Location: [facility] ENDOSCOPY;  Service: Cardiovascular;  Laterality: N/A;  . Cardiac surgery      11/20/20116  . Cardiac catheterization N/A 05/12/2015    Procedure: Left Heart Cath and Coronary Angiography;  Surgeon: Troy Sine, MD;  Location: [place]  CV LAB;  Service: Cardiovascular;  Laterality: N/A;  . Cardiac catheterization N/A 05/12/2015    Procedure: Coronary Stent Intervention;  Surgeon: Troy Sine, MD;  Location: [place] CV LAB;  Service: Cardiovascular;  Laterality: N/A;   Family History  Family History  Problem Relation Age of Onset  . Heart disease Brother     3 brothers with heart disease (2 also with CVA)  . Hypertension Brother   . Heart disease Mother   . Hypertension Mother   . Stroke Mother   . Stroke Father   . Hypertension Brother   . Stroke Brother    Social History  reports that she has quit smoking. Her smoking use included Cigarettes. She has a 7.5 pack-year smoking history. She has never used smokeless tobacco. She reports that she drinks about 9.0 oz of alcohol per week. She reports that she does not use illicit drugs. Allergies  Allergies  Allergen Reactions  . Clonidine Derivatives Other (See Comments)    Lowers heart rate  . Plavix [Clopidogrel Bisulfate] Other (See Comments)    Joint pains   . Exforge [Amlodipine Besylate-Valsartan] Itching and Rash   Home medications Prior to Admission medications   Medication Sig Start Date End Date Taking? Authorizing Provider  aspirin 81 MG EC tablet Take 81 mg by mouth daily.    Yes Historical Provider, MD  cholecalciferol (VITAMIN D) 1000 UNITS tablet Take 1,000 Units by mouth daily.   Yes Historical Provider, MD  citalopram (CELEXA) 20 MG tablet Take 1 tablet (20 mg total) by mouth daily. 12/05/12  Yes Brett Canales, PA-C  cyanocobalamin 500 MCG tablet Take 1 tablet (500 mcg total) by mouth daily. Patient taking differently: Take 1,000 mcg by mouth daily.  08/08/14  Yes Robyn Haber, MD  isosorbide mononitrate (IMDUR) 30 MG 24 hr tablet Take 1 tablet (30 mg total) by mouth daily. 05/15/15  Yes Brittainy Erie Noe, PA-C  lisinopril (PRINIVIL,ZESTRIL) 10 MG tablet Take 1 tablet (10 mg total) by mouth daily. 05/15/15  Yes Brittainy Erie Noe, PA-C  metoprolol tartrate (LOPRESSOR) 25 MG tablet Take 1 tablet (25 mg total) by mouth 2 (two) times daily. 02/13/15  Yes Rhonda G Barrett, PA-C  nitroGLYCERIN (NITROSTAT) 0.4 MG SL tablet Place 1 tablet (0.4 mg total) under the tongue every 5 (five) minutes x 3 doses as needed for chest pain. 05/15/15  Yes Brittainy Erie Noe, PA-C  vitamin E 400 UNIT capsule Take 400 Units by mouth daily.   Yes Historical Provider, MD  atorvastatin (LIPITOR) 80 MG tablet Take 1 tablet (80 mg total) by mouth daily at 6 PM. Patient not taking: Reported on 06/27/2015 05/15/15   Brittainy Erie Noe, PA-C  clopidogrel (PLAVIX) 75 MG tablet Take 1 tablet (75 mg total) by mouth daily. Patient not taking: Reported on 06/27/2015 05/31/15   Sanda Klein, MD   Liver Function Tests  Recent Labs Lab 06/26/15 1105 06/27/15 1941  AST 21 25  ALT 14 22  ALKPHOS 118 169*  BILITOT 0.6 0.4  PROT 6.2* 6.0*  ALBUMIN 2.8* 2.6*    Recent Labs Lab 06/26/15 1105  LIPASE 46   CBC  Recent Labs Lab 06/26/15 1105 06/27/15 1941  WBC  6.6 6.4  NEUTROABS  --  4.8  HGB 12.0 11.1*  HCT 35.1* 33.5*  MCV 85.4 84.6  PLT 189 [id number]   Basic Metabolic Panel  Recent Labs Lab 06/26/15 1105 06/27/15 1941  NA 134* 134*  K 4.0 4.3  CL 99*  103  CO2 23 24  GLUCOSE 104* 131*  BUN 14 15  CREATININE 0.81 0.95  CALCIUM 10.2 10.0     Filed Vitals:   06/27/15 1900 06/27/15 2000 06/27/15 2030 06/27/15 2045  BP: 109/45 112/61 124/72 131/65  Pulse:  75 83 76  Temp:      TempSrc:      Resp:  23 18 17   SpO2:  91% 90% 94%   Exam: VSS Gen elderly WF, no distress, looks exhausted, not toxic No rash, cyanosis or gangrene Sclera anicteric, throat clear, TM"s wnl  No jvd or bruits Chest clear bilat no rales or wheezing RRR no MRG Abd soft ntnd no mass or ascites +bs GU deferred MS no joint effusions or deformity Ext no LE edema / no wounds or ulcers Neuro is alert, Ox 3 , nf, mild gen'd weakness   EKG (independently reviewed) > NSR , no acute change CXR (independently reviewed) > PPM, sternal wires, no infiltrates   Home medications: celexa, asa, imdur, lisinopril, lopressor, lipitor (just dc'd), plavix  Na 134  K 4.3  CO2 24  BUN 15  Creat 0.95  Ca 10.0  Alb 2.6  corr Ca 11.2, glu 131 LFT"s ok    WBC 6k  Hb 11  plt 199  Trop neg x 3 yest and today/  BNP 427 UA few bact, neg nit/ small LE, 0-5 wbc/ rbc/epi  Assessment: 1. Fevers/ cough/ malaise - high fevers, unclear cause by exam and current labs. Possible bronchitis. CXR neg, UA neg.  Sick for over a week w high fevers. Abd benign.  Get blood cx's, r/o endocarditis. Empiric abx for resp infection.  Viral swab for PCR.   2. CAD sp 3VCABG Nov [id number] - complicated by NSTEMI w 2 or 3 occluded grafts April 2017. Had PCI to one of the occluded SVG's and PCI to native RCA; the other occluded graft (ramus) was not addressed at this time. 3. Paroxysmal afib -  In NSR now 4. Sinus node dysfunction, sp PPM Dec 2013 5. Fatigue - main complaint, not resolved since CABG last year.   With muscle weakness pt's cardiologist told her to STOP statin yesterday.  Treat/ diagnose infection.  Will consider change beta-blocker to Cardizem, for parox afib, or hold betablocker altogether as she is in NSR.  May help fatigue. 6. Alcohol abuse, daily use 7. Hypoalbuminemia - unclear cause, 2.8 now, worse since CABG (was 4.0 then ) last year.  ?related to ETOH abuse  Plan - as above   DVT Prophylaxis lovenox  Code Status: full  Family Communication: husband here  Disposition Plan: when better   Sol Blazing [facility] Pager [phone]  Cell [phone]  If 7PM-7AM, please contact night-coverage [email or web address] Password [facility] 06/27/2015, 9:45 PM

## 2015-06-27 NOTE — ED Notes (Signed)
Parke Simmers, RN made aware of pt Droplet precautions and that pt still needs to be swabbed for the respiratory panel.

## 2015-06-27 NOTE — Telephone Encounter (Signed)
New message       The pt is upset cause no-one at the ER department explained anything to the pt yesterday, the pt states there was a x-ray done but no-one went over the results with the pt.   The pt also states "she was disappointed her heart doctor did not come and see her while she was in the ER", and the pt states "she knows that Dr. Sallyanne Kuster was making rounds in the hospital".

## 2015-06-27 NOTE — Telephone Encounter (Signed)
Spoke with pt, I tried to answer her questions, she is addiment that she wants to talk to dr croitiru.  She has a lot of questions and concerns.  She has a follow up with dr croitoru 07-11-15 but that appointment is 2 weeks away. Will forward this to dr croitoru

## 2015-06-27 NOTE — Telephone Encounter (Signed)
Reviewed symptoms with [name]. She describes weakness and muscle fatigue and aches. I think she may have statin myopathy. Her CXR did not support CHF, BNP is lower than in the past, she does not have anemia, electrolytes are OK.  Stop statin today. Will reevaluate symptoms and choice of lipid lowering therapy at her upcoming appointment.

## 2015-06-28 ENCOUNTER — Observation Stay (HOSPITAL_BASED_OUTPATIENT_CLINIC_OR_DEPARTMENT_OTHER): Payer: Commercial Managed Care - HMO

## 2015-06-28 ENCOUNTER — Encounter (HOSPITAL_COMMUNITY): Payer: Self-pay | Admitting: *Deleted

## 2015-06-28 DIAGNOSIS — I255 Ischemic cardiomyopathy: Secondary | ICD-10-CM | POA: Diagnosis not present

## 2015-06-28 DIAGNOSIS — R509 Fever, unspecified: Secondary | ICD-10-CM | POA: Diagnosis not present

## 2015-06-28 DIAGNOSIS — J40 Bronchitis, not specified as acute or chronic: Secondary | ICD-10-CM | POA: Diagnosis not present

## 2015-06-28 DIAGNOSIS — F1010 Alcohol abuse, uncomplicated: Secondary | ICD-10-CM | POA: Diagnosis not present

## 2015-06-28 LAB — ECHOCARDIOGRAM COMPLETE
Height: 65 in
Weight: 2204.8 oz

## 2015-06-28 LAB — URINE CULTURE

## 2015-06-28 LAB — INFLUENZA PANEL BY PCR (TYPE A & B)
H1N1 flu by pcr: NOT DETECTED
Influenza A By PCR: NEGATIVE
Influenza B By PCR: NEGATIVE

## 2015-06-28 LAB — CK: Total CK: 19 U/L — ABNORMAL LOW (ref 38–234)

## 2015-06-28 MED ORDER — VITAMIN B-12 1000 MCG PO TABS
1000.0000 ug | ORAL_TABLET | Freq: Every day | ORAL | Status: DC
  Administered 2015-06-28 – 2015-06-29 (×2): 1000 ug via ORAL
  Filled 2015-06-28 (×2): qty 1

## 2015-06-28 MED ORDER — SODIUM CHLORIDE 0.9 % IV SOLN
INTRAVENOUS | Status: DC
  Administered 2015-06-28: 01:00:00 via INTRAVENOUS

## 2015-06-28 MED ORDER — ENSURE ENLIVE PO LIQD
237.0000 mL | Freq: Two times a day (BID) | ORAL | Status: DC
  Administered 2015-06-29: 237 mL via ORAL

## 2015-06-28 MED ORDER — ONDANSETRON HCL 4 MG/2ML IJ SOLN
4.0000 mg | Freq: Four times a day (QID) | INTRAMUSCULAR | Status: DC | PRN
  Administered 2015-06-28: 4 mg via INTRAVENOUS
  Filled 2015-06-28: qty 2

## 2015-06-28 MED ORDER — LISINOPRIL 10 MG PO TABS
10.0000 mg | ORAL_TABLET | Freq: Every day | ORAL | Status: DC
  Administered 2015-06-28 – 2015-06-29 (×2): 10 mg via ORAL
  Filled 2015-06-28 (×2): qty 1

## 2015-06-28 MED ORDER — FUROSEMIDE 20 MG PO TABS
20.0000 mg | ORAL_TABLET | Freq: Every day | ORAL | Status: DC
  Administered 2015-06-28 – 2015-06-29 (×2): 20 mg via ORAL
  Filled 2015-06-28 (×2): qty 1

## 2015-06-28 MED ORDER — SODIUM CHLORIDE 0.9% FLUSH
3.0000 mL | Freq: Two times a day (BID) | INTRAVENOUS | Status: DC
  Administered 2015-06-28 – 2015-06-29 (×4): 3 mL via INTRAVENOUS

## 2015-06-28 MED ORDER — VITAMIN D 1000 UNITS PO TABS
1000.0000 [IU] | ORAL_TABLET | Freq: Every day | ORAL | Status: DC
  Administered 2015-06-28 – 2015-06-29 (×2): 1000 [IU] via ORAL
  Filled 2015-06-28 (×2): qty 1

## 2015-06-28 MED ORDER — DEXTROSE 5 % IV SOLN
250.0000 mg | INTRAVENOUS | Status: DC
  Administered 2015-06-28: 250 mg via INTRAVENOUS
  Filled 2015-06-28 (×2): qty 250

## 2015-06-28 MED ORDER — CITALOPRAM HYDROBROMIDE 20 MG PO TABS
20.0000 mg | ORAL_TABLET | Freq: Every day | ORAL | Status: DC
  Administered 2015-06-28 – 2015-06-29 (×2): 20 mg via ORAL
  Filled 2015-06-28 (×2): qty 1

## 2015-06-28 MED ORDER — VITAMIN E 180 MG (400 UNIT) PO CAPS
400.0000 [IU] | ORAL_CAPSULE | Freq: Every day | ORAL | Status: DC
  Administered 2015-06-28 – 2015-06-29 (×2): 400 [IU] via ORAL
  Filled 2015-06-28 (×2): qty 1

## 2015-06-28 MED ORDER — ACETAMINOPHEN 325 MG PO TABS: 650.0000 mg | ORAL_TABLET | Freq: Four times a day (QID) | ORAL | Status: DC | PRN

## 2015-06-28 MED ORDER — ACETAMINOPHEN 650 MG RE SUPP: 650.0000 mg | Freq: Four times a day (QID) | RECTAL | Status: DC | PRN

## 2015-06-28 MED ORDER — METOPROLOL TARTRATE 25 MG PO TABS
25.0000 mg | ORAL_TABLET | Freq: Two times a day (BID) | ORAL | Status: DC
  Administered 2015-06-28 – 2015-06-29 (×4): 25 mg via ORAL
  Filled 2015-06-28 (×4): qty 1

## 2015-06-28 MED ORDER — ISOSORBIDE MONONITRATE ER 30 MG PO TB24
30.0000 mg | ORAL_TABLET | Freq: Every day | ORAL | Status: DC
  Administered 2015-06-28 – 2015-06-29 (×2): 30 mg via ORAL
  Filled 2015-06-28 (×2): qty 1

## 2015-06-28 MED ORDER — ENOXAPARIN SODIUM 40 MG/0.4ML ~~LOC~~ SOLN
40.0000 mg | Freq: Every day | SUBCUTANEOUS | Status: DC
  Administered 2015-06-28 (×2): 40 mg via SUBCUTANEOUS
  Filled 2015-06-28 (×2): qty 0.4

## 2015-06-28 MED ORDER — ASPIRIN EC 81 MG PO TBEC
81.0000 mg | DELAYED_RELEASE_TABLET | Freq: Every day | ORAL | Status: DC
  Administered 2015-06-28 – 2015-06-29 (×2): 81 mg via ORAL
  Filled 2015-06-28 (×2): qty 1

## 2015-06-28 MED ORDER — M.V.I. ADULT IV INJ
INJECTION | Freq: Once | INTRAVENOUS | Status: AC
  Administered 2015-06-28: 01:00:00 via INTRAVENOUS
  Filled 2015-06-28: qty 1000

## 2015-06-28 MED ORDER — CLOPIDOGREL BISULFATE 75 MG PO TABS
75.0000 mg | ORAL_TABLET | Freq: Every day | ORAL | Status: DC
  Administered 2015-06-28 – 2015-06-29 (×2): 75 mg via ORAL
  Filled 2015-06-28 (×2): qty 1

## 2015-06-28 MED ORDER — DEXTROSE 5 % IV SOLN
1.0000 g | Freq: Every day | INTRAVENOUS | Status: DC
  Administered 2015-06-28 (×2): 1 g via INTRAVENOUS
  Filled 2015-06-28 (×3): qty 10

## 2015-06-28 NOTE — Progress Notes (Signed)
  Echocardiogram 2D Echocardiogram has been performed.  [name] 06/28/2015, 3:14 PM

## 2015-06-28 NOTE — Progress Notes (Addendum)
[facility] PROGRESS NOTE  [name] [id number] DOB: Sep 24, 1940 DOA: 06/27/2015   PCP: [name], MD     Assessment/Plan: Principal Problem:   Febrile illness, acute Active Problems:   PAF (paroxysmal atrial fibrillation) (HCC)   S/P placement of cardiac pacemaker, medtronic adapta 01/21/12   PVD (peripheral vascular disease), hx stents to bil SFAs 02/2010   S/P CABG x 3 01/04/15   Hypoxemia   Chronic fatigue   Alcohol abuse, daily use   [name] is a 75 y.o. female with hx of PVD and CAD sp CABG Nov 2016 presenting to ED with 1-2 weeks of dry cough and recurrent fevers. Temps to 102-103 for 7-10 days, no outpt abx, thought it may be a virus and hoping it would pass. Had n/v/d for one day but none presently. No abd pain, sore throat, ear pain or HA, no dysuria or cloudy urine, no prod cough, no rash or ulcers. SaO2 in ED was dropping into 80's,. CXR clear. Asked to admit for recurrent fevers and hypoxemia.   Patient's main concern is not the fevers but severe fatigue and malaise that predates this febrile illness, goes back almost to her CABG last year November. Doesn't have energy to do anything, is not going to the gym like pre op before the CABG. Not improved after stent x 2 in April this year. Severe "weakness " in her thighs and legs and she spoke w her cardiologist yesterday who said to stop her statin because of this. No claudication. +hx bilat SFA stents 2-3 yrs ago. Patient also has history of drinking vodka 2-3 drinks a day. Patient is status post left heart cath on 05/12/15.-successful percutaneous coronary intervention to the proximal native RCA , discharged home on aspirin and brillinta .       Assessment and plan Acute viral bronchitis-Fevers/ cough/ malaise - high fevers, unclear cause by exam and current labs. Possible bronchitis. CXR neg, UA neg. Sick for over a week w high fevers. Influenza PCR negative, will check a respiratory  virus panel, Abd benign. Blood cultures from 5/19 pending. Continue empiric abx for resp infection on Rocephin and azithromycin.    CAD status post 3VCABG Nov [id number] - complicated by NSTEMI w 2 or 3 occluded grafts April 2017. Had PCI to one of the occluded SVG's and PCI to native RCA; the other occluded graft (ramus) was not addressed at this time.Just discharged 05/15/15 after NSTEMI- SVG-RCA DES, native RCA DES. Cardiac enzymes have been negative this admission   PAF (paroxysmal atrial fibrillation) (HCC) DCCV after CABG- she had atrial clipping at the time of her CABG-(she is not anticoagulated)   S/P placement of cardiac pacemaker due to sinus node dysfunction, medtronic adapta 01/21/12  Fatigue secondary to chronic systolic heart failure - main complaint, not resolved since CABG last year, 2-D echo will be repeated and pending, cardiac cath showed EF of 35-45%. With muscle weakness pt's cardiologist told her to STOP statin yesterday. Treat/ diagnose infection. Will consider change beta-blocker to Cardizem, for parox afib, or hold betablocker altogether as she is in NSR. Trial of low-dose Lasix 20 mg a day    Alcohol abuse, CIWA protocol, last TSH 5.39, start CIWA protocol  hypoalbuminemia/mild protein calorie malnutrition - unclear cause, 2.8 now, worse since CABG (was 4.0 then ) last year. ?related to ETOH abuse      DVT prophylaxsis SCDs  Code Status:  Full code   Family Communication: Discussed in  detail with the patient, all imaging results, lab results explained to the patient   Disposition Plan: Anticipate discharge in the next 1-2 days, PT OT evaluation due to weakness    Consultants:  None  Procedures:  None  Antibiotics: Rocephin Azithromycin       HPI/Subjective: Extremely anxious about not having her energy for the last 2 weeks, complains of dyspnea on exertion,  Objective: Filed Vitals:   06/27/15 2200 06/28/15 0006 06/28/15 0639 06/28/15 0932   BP: 103/71 131/59 129/64 124/61  Pulse: 70 75 80 76  Temp:  98.1 F (36.7 C) 99.9 F (37.7 C)   TempSrc:  Oral Oral   Resp: 16 18 18    Height:  5\' 5"  (1.651 m)    Weight:  62.506 kg (137 lb 12.8 oz)    SpO2: 91% 96% 92%     Intake/Output Summary (Last 24 hours) at 06/28/15 1000 Last data filed at 06/28/15 0849  Gross per 24 hour  Intake    360 ml  Output    400 ml  Net    -40 ml    Exam:  Examination:  General exam: Appears calm and comfortable  Respiratory system: Clear to auscultation. Respiratory effort normal. Cardiovascular system: S1 & S2 heard, RRR. No JVD, murmurs, rubs, gallops or clicks. No pedal edema. Gastrointestinal system: Abdomen is nondistended, soft and nontender. No organomegaly or masses felt. Normal bowel sounds heard. Central nervous system: Alert and oriented. No focal neurological deficits. Extremities: Symmetric 5 x 5 power. Skin: No rashes, lesions or ulcers Psychiatry: Judgement and insight appear normal. Mood & affect appropriate.     Data Reviewed: I have personally reviewed following labs and imaging studies  Micro Results No results found for this or any previous visit (from the past 240 hour(s)).  Radiology Reports Dg Chest 2 View  06/27/2015  CLINICAL DATA:  Chest pain and cough for 1 week. EXAM: CHEST  2 VIEW COMPARISON:  Chest x-rays dated 06/26/2015 and 05/11/2015. FINDINGS: Cardiomediastinal silhouette is stable in size and configuration. Median sternotomy wires appear intact and stable in alignment. Left chest wall pacemaker/ICD is stable in position. Lungs appear hyperexpanded suggesting some degree of COPD. Suspect associated chronic bronchitic changes centrally and lower lobe chronic interstitial lung disease. No new lung findings. No evidence of pneumonia. No pleural effusion or pneumothorax seen. No evidence of acute osseous abnormality. IMPRESSION: 1. Hyperexpanded lungs suggesting COPD/emphysema. Suspect associated chronic  bronchitic changes centrally and chronic interstitial lung disease within the lower lobes. 2. No acute findings.  No evidence of pneumonia. Electronically Signed   By: Franki Cabot M.D.   On: 06/27/2015 19:42   Dg Chest 2 View  06/26/2015  CLINICAL DATA:  Cough and chest pain EXAM: CHEST  2 VIEW COMPARISON:  05/11/2015 FINDINGS: Cardiac shadow is stable. Postsurgical changes are noted. A pacing device is again seen. Bilateral breast implants are noted. The lungs are well aerated without focal infiltrate or sizable effusion. No acute bony abnormality is seen. IMPRESSION: No active cardiopulmonary disease. Electronically Signed   By: Inez Catalina M.D.   On: 06/26/2015 11:42     CBC  Recent Labs Lab 06/26/15 1105 06/27/15 1941  WBC 6.6 6.4  HGB 12.0 11.1*  HCT 35.1* 33.5*  PLT 189 199  MCV 85.4 84.6  MCH 29.2 28.0  MCHC 34.2 33.1  RDW 14.3 14.2  LYMPHSABS  --  0.7  MONOABS  --  0.7  EOSABS  --  0.2  BASOSABS  --  0.0    Chemistries   Recent Labs Lab 06/26/15 1105 06/27/15 1941  NA 134* 134*  K 4.0 4.3  CL 99* 103  CO2 23 24  GLUCOSE 104* 131*  BUN 14 15  CREATININE 0.81 0.95  CALCIUM 10.2 10.0  AST 21 25  ALT 14 22  ALKPHOS 118 169*  BILITOT 0.6 0.4   ------------------------------------------------------------------------------------------------------------------ estimated creatinine clearance is 46 mL/min (by C-G formula based on Cr of 0.95). ------------------------------------------------------------------------------------------------------------------ No results for input(s): HGBA1C in the last 72 hours. ------------------------------------------------------------------------------------------------------------------ No results for input(s): CHOL, HDL, LDLCALC, TRIG, CHOLHDL, LDLDIRECT in the last 72 hours. ------------------------------------------------------------------------------------------------------------------ No results for input(s): TSH, T4TOTAL,  T3FREE, THYROIDAB in the last 72 hours.  Invalid input(s): FREET3 ------------------------------------------------------------------------------------------------------------------ No results for input(s): VITAMINB12, FOLATE, FERRITIN, TIBC, IRON, RETICCTPCT in the last 72 hours.  Coagulation profile No results for input(s): INR, PROTIME in the last 168 hours.  No results for input(s): DDIMER in the last 72 hours.  Cardiac Enzymes No results for input(s): CKMB, TROPONINI, MYOGLOBIN in the last 168 hours.  Invalid input(s): CK ------------------------------------------------------------------------------------------------------------------ Invalid input(s): POCBNP   CBG: No results for input(s): GLUCAP in the last 168 hours.     Studies: Dg Chest 2 View  06/27/2015  CLINICAL DATA:  Chest pain and cough for 1 week. EXAM: CHEST  2 VIEW COMPARISON:  Chest x-rays dated 06/26/2015 and 05/11/2015. FINDINGS: Cardiomediastinal silhouette is stable in size and configuration. Median sternotomy wires appear intact and stable in alignment. Left chest wall pacemaker/ICD is stable in position. Lungs appear hyperexpanded suggesting some degree of COPD. Suspect associated chronic bronchitic changes centrally and lower lobe chronic interstitial lung disease. No new lung findings. No evidence of pneumonia. No pleural effusion or pneumothorax seen. No evidence of acute osseous abnormality. IMPRESSION: 1. Hyperexpanded lungs suggesting COPD/emphysema. Suspect associated chronic bronchitic changes centrally and chronic interstitial lung disease within the lower lobes. 2. No acute findings.  No evidence of pneumonia. Electronically Signed   By: Franki Cabot M.D.   On: 06/27/2015 19:42   Dg Chest 2 View  06/26/2015  CLINICAL DATA:  Cough and chest pain EXAM: CHEST  2 VIEW COMPARISON:  05/11/2015 FINDINGS: Cardiac shadow is stable. Postsurgical changes are noted. A pacing device is again seen. Bilateral breast  implants are noted. The lungs are well aerated without focal infiltrate or sizable effusion. No acute bony abnormality is seen. IMPRESSION: No active cardiopulmonary disease. Electronically Signed   By: Inez Catalina M.D.   On: 06/26/2015 11:42      Lab Results  Component Value Date   HGBA1C 6.4* 01/31/2015   HGBA1C 6.2* 01/04/2015   Lab Results  Component Value Date   LDLCALC 71 05/12/2015   CREATININE 0.95 06/27/2015       Scheduled Meds: . aspirin EC  81 mg Oral Daily  . azithromycin  250 mg Intravenous Q24H  . cefTRIAXone (ROCEPHIN)  IV  1 g Intravenous QHS  . cholecalciferol  1,000 Units Oral Daily  . citalopram  20 mg Oral Daily  . clopidogrel  75 mg Oral Daily  . enoxaparin (LOVENOX) injection  40 mg Subcutaneous QHS  . isosorbide mononitrate  30 mg Oral Daily  . lisinopril  10 mg Oral Daily  . metoprolol tartrate  25 mg Oral BID  . sodium chloride flush  3 mL Intravenous Q12H  . cyanocobalamin  1,000 mcg Oral Daily  . vitamin E  400 Units Oral Daily   Continuous Infusions: . sodium chloride 10 mL/hr at  06/28/15 0119        Time spent: >30 MINS    [facility] Pager [phone]. If 7PM-7AM, please contact night-coverage at [email or web address], password [facility] 06/28/2015, 10:00 AM

## 2015-06-29 DIAGNOSIS — R509 Fever, unspecified: Secondary | ICD-10-CM | POA: Diagnosis not present

## 2015-06-29 LAB — CBC
HCT: 34 % — ABNORMAL LOW (ref 36.0–46.0)
Hemoglobin: 11 g/dL — ABNORMAL LOW (ref 12.0–15.0)
MCH: 27.6 pg (ref 26.0–34.0)
MCHC: 32.4 g/dL (ref 30.0–36.0)
MCV: 85.2 fL (ref 78.0–100.0)
Platelets: 195 10*3/uL (ref 150–400)
RBC: 3.99 MIL/uL (ref 3.87–5.11)
RDW: 14.6 % (ref 11.5–15.5)
WBC: 6.6 10*3/uL (ref 4.0–10.5)

## 2015-06-29 LAB — COMPREHENSIVE METABOLIC PANEL
ALT: 41 U/L (ref 14–54)
AST: 43 U/L — ABNORMAL HIGH (ref 15–41)
Albumin: 2.5 g/dL — ABNORMAL LOW (ref 3.5–5.0)
Alkaline Phosphatase: 195 U/L — ABNORMAL HIGH (ref 38–126)
Anion gap: 9 (ref 5–15)
BUN: 10 mg/dL (ref 6–20)
CO2: 26 mmol/L (ref 22–32)
Calcium: 9.8 mg/dL (ref 8.9–10.3)
Chloride: 100 mmol/L — ABNORMAL LOW (ref 101–111)
Creatinine, Ser: 0.89 mg/dL (ref 0.44–1.00)
GFR calc Af Amer: >60 mL/min (ref >60)
GFR calc non Af Amer: >60 mL/min (ref >60)
Glucose, Bld: 98 mg/dL (ref 65–99)
Potassium: 4.3 mmol/L (ref 3.5–5.1)
Sodium: 135 mmol/L (ref 135–145)
Total Bilirubin: 0.2 mg/dL — ABNORMAL LOW (ref 0.3–1.2)
Total Protein: 5.7 g/dL — ABNORMAL LOW (ref 6.5–8.1)

## 2015-06-29 MED ORDER — AZITHROMYCIN 250 MG PO TABS: 250.0000 mg | ORAL_TABLET | Freq: Every day | ORAL | Status: DC

## 2015-06-29 NOTE — Progress Notes (Signed)
Ambulate patient in hallway, no c/o shortness of breath or pain, iv and tele d/c. Patient waiting for her ride. Will d/c pt. Home per order.

## 2015-06-29 NOTE — Discharge Instructions (Signed)
Follow with Primary MD [name] Solo, MD in 3-4 days   Get CBC, CMP, 2 view Chest X ray checked  by Primary MD next visit.    Activity: As tolerated with Full fall precautions use walker/cane & assistance as needed   Disposition Home     Diet:   Heart Healthy .  For Heart failure patients - Check your Weight same time everyday, if you gain over 2 pounds, or you develop in leg swelling, experience more shortness of breath or chest pain, call your Primary MD immediately. Follow Cardiac Low Salt Diet and 1.5 lit/day fluid restriction.   On your next visit with your primary care physician please Get Medicines reviewed and adjusted.   Please request your Prim.MD to go over all Hospital Tests and Procedure/Radiological results at the follow up, please get all Hospital records sent to your Prim MD by signing hospital release before you go home.   If you experience worsening of your admission symptoms, develop shortness of breath, life threatening emergency, suicidal or homicidal thoughts you must seek medical attention immediately by calling 911 or calling your MD immediately  if symptoms less severe.  You Must read complete instructions/literature along with all the possible adverse reactions/side effects for all the Medicines you take and that have been prescribed to you. Take any new Medicines after you have completely understood and accpet all the possible adverse reactions/side effects.   Do not drive, operate heavy machinery, perform activities at heights, swimming or participation in water activities or provide baby sitting services if your were admitted for syncope or siezures until you have seen by Primary MD or a Neurologist and advised to do so again.  Do not drive when taking Pain medications.    Do not take more than prescribed Pain, Sleep and Anxiety Medications  Special Instructions: If you have smoked or chewed Tobacco  in the last 2 yrs please stop smoking, stop any  regular Alcohol  and or any Recreational drug use.  Wear Seat belts while driving.   Please note  You were cared for by a hospitalist during your hospital stay. If you have any questions about your discharge medications or the care you received while you were in the hospital after you are discharged, you can call the unit and asked to speak with the hospitalist on call if the hospitalist that took care of you is not available. Once you are discharged, your primary care physician will handle any further medical issues. Please note that NO REFILLS for any discharge medications will be authorized once you are discharged, as it is imperative that you return to your primary care physician (or establish a relationship with a primary care physician if you do not have one) for your aftercare needs so that they can reassess your need for medications and monitor your lab values.

## 2015-06-29 NOTE — Discharge Summary (Signed)
[name], is a 75 y.o. female  DOB 10/13/1940  MRN [id number].  Admission date:  06/27/2015  Admitting Physician  Roney Jaffe, MD  Discharge Date:  06/29/2015   Primary MD  [name], MD  Recommendations for primary care physician for things to follow:    Check CBC, CMP, TSH, A1c, B12 & folate next visit. Check two-view chest x-ray next visit.   Admission Diagnosis  Shortness of breath [R06.02] Bronchitis [J40] Fever, unspecified fever cause [R50.9]   Discharge Diagnosis  Shortness of breath [R06.02] Bronchitis [J40] Fever, unspecified fever cause [R50.9]    Principal Problem:   Febrile illness, acute Active Problems:   PAF (paroxysmal atrial fibrillation) (HCC)   S/P placement of cardiac pacemaker, medtronic adapta 01/21/12   PVD (peripheral vascular disease), hx stents to bil SFAs 02/2010   S/P CABG x 3 01/04/15   Hypoxemia   Chronic fatigue   Alcohol abuse, daily use      Past Medical History  Diagnosis Date  . GLUCOSE INTOLERANCE 12/31/2009  . HYPERLIPIDEMIA 12/31/2009  . ANXIETY 12/31/2009  . DEPRESSION 12/31/2009  . MITRAL REGURGITATION 12/31/2009  . HYPERTENSION 12/31/2009  . CORONARY ARTERY DISEASE 12/31/2009  . MITRAL VALVE PROLAPSE 12/31/2009  . MENOPAUSE, EARLY 12/31/2009  . Unspecified urinary incontinence 12/31/2009  . ABDOMINAL PAIN, LOWER 12/31/2009  . DIVERTICULITIS, HX OF 12/31/2009  . Pacemaker 01/21/2012    MDT Adapta dual chamber  . Tachycardia-bradycardia syndrome ([place])     [name] 01/21/2012  . PAD (peripheral artery disease) ([place])   . Heart murmur     "used to say I did" (01/21/2012)  . OSTEOARTHRITIS, HAND 12/31/2009  . [place] DISEASE, CERVICAL 12/31/2009  . [place] DISEASE, LUMBAR 12/31/2009  . Symptomatic sinus bradycardia 01/22/2012  . PAF (paroxysmal  atrial fibrillation) ([place]) 01/22/2012  . Sinus node dysfunction ([place]) 01/22/2012  . PVD (peripheral vascular disease), hx stents to bil SFAs 02/2010 01/22/2012  . PAD (peripheral artery disease) ([place])   . Atrial fibrillation with RVR ([place])   . NSTEMI (non-ST elevated myocardial infarction) ([place]) 05/11/2015    Past Surgical History  Procedure Laterality Date  . Cardiac catheterization  2005    "negative" (01/21/2012)  . Oophorectomy  ~1979  . Partial colectomy  2010  . Carpal tunnel release  2008    "right hand/thumb; carpal tunnel repair; got rid of arthritis" (01/21/2012)  . Insert / replace / remove pacemaker  01/21/2012    initial placement  . Vaginal hysterectomy  1975  . Posterior cervical laminectomy  1985  . Peripheral arterial stent graft  2012; 2012    "LLE; RLE" (01/21/2012)  . Facelift, lower 2/3  1995    "mini" (01/21/2012)  . Augmentation mammaplasty  1983  . Pacemaker insertion  01/21/2012    Medtronic Adapta, model# ADDRL1, serial# [id number]  . Lower arterial examination  10/28/2011    R. SFA stent mild-moderate mixed density plaque with elevated velocities consistent with 50% diameter reduction. L. SFA stent moderate mixed denisty plaque at mid to distal  level consistent with 50-69% diameter reduction.  . Cardiac catheterization  09/17/1999    Mild CAD involving proximal portion of LAD. Mitral valve prolapse w/ mitral regurg. No evidence of renal artery stenosis. Recommended readjustment of antihypertensive medications.  . Persantine myoview stress test  02/25/2010    No scintigraphic evidence of inducible myocardial ischemia. No persantine EKG changes. Non-diagnositc for ishcemia. Low-risk, normal scan.  Marland Kitchen Permanent pacemaker insertion N/A 01/21/2012    Procedure: PERMANENT PACEMAKER INSERTION;  Surgeon: Sanda Klein, MD;  Location: [facility] CATH LAB;  Service: Cardiovascular;  Laterality: N/A;  . Cardiac catheterization N/A 01/01/2015    Procedure: Left Heart Cath and  Coronary Angiography;  Surgeon: Jettie Booze, MD;  Location: [facility] CV LAB;  Service: Cardiovascular;  Laterality: N/A;  . Coronary artery bypass graft N/A 01/04/2015    Procedure: CORONARY ARTERY BYPASS GRAFTING (CABG) x 3 using left internal mammory artery and greater saphenous vein right leg harvested endoscopically.;  Surgeon: Grace Isaac, MD;  LIMA-LAD, SVG-RI, SVG-PDA  . Tee without cardioversion N/A 01/04/2015    Procedure: TRANSESOPHAGEAL ECHOCARDIOGRAM (TEE);  Surgeon: Grace Isaac, MD;  Location: [facility];  Service: Open Heart Surgery;  Laterality: N/A;  . Clipping of atrial appendage N/A 01/04/2015    Procedure: CLIPPING OF ATRIAL APPENDAGE;  Surgeon: Grace Isaac, MD;  Location: [facility];  Service: Open Heart Surgery;  Laterality: N/A;  . Cardioversion N/A 02/01/2015    Procedure: CARDIOVERSION;  Surgeon: Lelon Perla, MD;  Location: [facility] ENDOSCOPY;  Service: Cardiovascular;  Laterality: N/A;  . Tee without cardioversion N/A 02/01/2015    Procedure: TRANSESOPHAGEAL ECHOCARDIOGRAM (TEE);  Surgeon: Lelon Perla, MD;  Location: [facility] ENDOSCOPY;  Service: Cardiovascular;  Laterality: N/A;  . Cardiac surgery      11/20/20116  . Cardiac catheterization N/A 05/12/2015    Procedure: Left Heart Cath and Coronary Angiography;  Surgeon: Troy Sine, MD;  Location: [facility] CV LAB;  Service: Cardiovascular;  Laterality: N/A;  . Cardiac catheterization N/A 05/12/2015    Procedure: Coronary Stent Intervention;  Surgeon: Troy Sine, MD;  Location: [facility] LAB;  Service: Cardiovascular;  Laterality: N/A;       HPI  from the history and physical done on the day of admission:    [name] is a 75 y.o. female with hx of PVD and CAD sp CABG Nov 2016 presenting to ED with 1-2 weeks of dry cough and recurrent fevers. Temps to 102-103 for 7-10 days, no outpt abx, thought it may be a virus and hoping it would pass. Had n/v/d for one day but none presently. No  abd pain, sore throat, ear pain or HA, no dysuria or cloudy urine, no prod cough, no rash or ulcers. SaO2 in ED was dropping into 80's,. CXR clear. Asked to admit for recurrent fevers and hypoxemia.   Patient's main concern is not the fevers but severe fatigue and malaise that predates this febrile illness, goes back almost to her CABG last year November. Doesn't have energy to do anything, is not going to the gym like pre op before the CABG. Not improved after stent x 2 in April this year. Severe "weakness " in her thighs and legs and she spoke w her cardiologist yesterday who said to stop her statin because of this. No claudication. +hx bilat SFA stents 2-3 yrs ago.   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 Never  smoked but drinks rather heavily, 2-3 "large" vodka drinks per day. Has a living will. Lives w her husband who is her today.No DM, hypertension 30-40 yrs.      Hospital Course:    Acute bronchitis. Chest x-ray nonacute, UA unremarkable, fevers have subsided no leukocytosis, she feels a whole lot better, was treated empirically with antibiotics, cultures negative, will be transitioned to oral azithromycin for 4 more days. Requested to follow with PCP in the next 3-4 days, PCP requested to check two-view chest x-ray along with CBC CMP next visit.   Fatigue and malaise. This predates illness and has been somewhat chronic, request PCP to check TSH, A1c, B-12 and folate.   CAD, paroxysmal atrial fibrillation. [place] vasc 2 score of at least 4. She status post CABG, she is status post atrial appendage clipping, follows with Dr. Sallyanne Kuster, echogram stable with improved EF of 55% and chronic grade 2 diastolic dysfunction. Have requested her to follow with her primary cardiologist, continue aspirin, Plavix, statin, ACE, beta blocker, Imdur for secondary prevention. EKG and neck was unremarkable this admission. She is not on  anticoagulation due to atrial appendage clipping.   Chronic grade 2 diastolic dysfunction. EF 55% on echogram. Compensated.    Sinus node dysfunction. Has pacemaker.   Alcohol abuse. No signs of DTs. Counseled to quit, request PCP to consider placing her on folic acid and thiamine. Check B-12, folate along with TSH for her ongoing fatigue and weakness.   Mild protein calorie malnutrition. Recommend protein diet.     Follow UP  Follow-up Information    Follow up with [name], MD. Schedule an appointment as soon as possible for a visit in 3 days.   Specialty:  Family Medicine   Contact information:   [place] [address] [phone]       Follow up with Sanda Klein, MD. Schedule an appointment as soon as possible for a visit in 1 week.   Specialty:  Cardiology   Contact information:   [address] [place] [address] [phone]        Consults obtained - none  Discharge Condition: Stable  Diet and Activity recommendation: See Discharge Instructions below  Discharge Instructions       Discharge Instructions    Diet - low sodium heart healthy    Complete by:  As directed      Discharge instructions    Complete by:  As directed   Follow with Primary MD [name], MD in 3-4 days   Get CBC, CMP, 2 view Chest X ray checked  by Primary MD next visit.    Activity: As tolerated with Full fall precautions use walker/cane & assistance as needed   Disposition Home     Diet:   Heart Healthy .  For Heart failure patients - Check your Weight same time everyday, if you gain over 2 pounds, or you develop in leg swelling, experience more shortness of breath or chest pain, call your Primary MD immediately. Follow Cardiac Low Salt Diet and 1.5 lit/day fluid restriction.   On your next visit with your primary care physician please Get Medicines reviewed and adjusted.   Please request your Prim.MD to go over all  Hospital Tests and Procedure/Radiological results at the follow up, please get all Hospital records sent to your Prim MD by signing hospital release before you go home.   If you experience worsening of your admission symptoms, develop shortness of breath, life threatening  emergency, suicidal or homicidal thoughts you must seek medical attention immediately by calling 911 or calling your MD immediately  if symptoms less severe.  You Must read complete instructions/literature along with all the possible adverse reactions/side effects for all the Medicines you take and that have been prescribed to you. Take any new Medicines after you have completely understood and accpet all the possible adverse reactions/side effects.   Do not drive, operate heavy machinery, perform activities at heights, swimming or participation in water activities or provide baby sitting services if your were admitted for syncope or siezures until you have seen by Primary MD or a Neurologist and advised to do so again.  Do not drive when taking Pain medications.    Do not take more than prescribed Pain, Sleep and Anxiety Medications  Special Instructions: If you have smoked or chewed Tobacco  in the last 2 yrs please stop smoking, stop any regular Alcohol  and or any Recreational drug use.  Wear Seat belts while driving.   Please note  You were cared for by a hospitalist during your hospital stay. If you have any questions about your discharge medications or the care you received while you were in the hospital after you are discharged, you can call the unit and asked to speak with the hospitalist on call if the hospitalist that took care of you is not available. Once you are discharged, your primary care physician will handle any further medical issues. Please note that NO REFILLS for any discharge medications will be authorized once you are discharged, as it is imperative that you return to your primary care physician (or  establish a relationship with a primary care physician if you do not have one) for your aftercare needs so that they can reassess your need for medications and monitor your lab values.     Increase activity slowly    Complete by:  As directed              Discharge Medications       Medication List    TAKE these medications        aspirin 81 MG EC tablet  Take 81 mg by mouth daily.     atorvastatin 80 MG tablet  Commonly known as:  LIPITOR  Take 1 tablet (80 mg total) by mouth daily at 6 PM.     azithromycin 250 MG tablet  Commonly known as:  ZITHROMAX  Take 1 tablet (250 mg total) by mouth daily.     cholecalciferol 1000 units tablet  Commonly known as:  VITAMIN D  Take 1,000 Units by mouth daily.     citalopram 20 MG tablet  Commonly known as:  CELEXA  Take 1 tablet (20 mg total) by mouth daily.     clopidogrel 75 MG tablet  Commonly known as:  PLAVIX  Take 1 tablet (75 mg total) by mouth daily.     cyanocobalamin 500 MCG tablet  Take 1 tablet (500 mcg total) by mouth daily.     isosorbide mononitrate 30 MG 24 hr tablet  Commonly known as:  IMDUR  Take 1 tablet (30 mg total) by mouth daily.     lisinopril 10 MG tablet  Commonly known as:  PRINIVIL,ZESTRIL  Take 1 tablet (10 mg total) by mouth daily.     metoprolol tartrate 25 MG tablet  Commonly known as:  LOPRESSOR  Take 1 tablet (25 mg total) by mouth 2 (two) times daily.     nitroGLYCERIN  0.4 MG SL tablet  Commonly known as:  NITROSTAT  Place 1 tablet (0.4 mg total) under the tongue every 5 (five) minutes x 3 doses as needed for chest pain.     vitamin E 400 UNIT capsule  Take 400 Units by mouth daily.        Major procedures and Radiology Reports - PLEASE review detailed and final reports for all details, in brief -   TTE  Normal LV systolic function EF [id number] ; grade 2 diastolic dysfunction;  sclerotic aortic valve with mild AI; mild RAE/RVE; mild TR with  mildly elevate pulmonary  pressure   Dg Chest 2 View  06/27/2015  CLINICAL DATA:  Chest pain and cough for 1 week. EXAM: CHEST  2 VIEW COMPARISON:  Chest x-rays dated 06/26/2015 and 05/11/2015. FINDINGS: Cardiomediastinal silhouette is stable in size and configuration. Median sternotomy wires appear intact and stable in alignment. Left chest wall pacemaker/ICD is stable in position. Lungs appear hyperexpanded suggesting some degree of COPD. Suspect associated chronic bronchitic changes centrally and lower lobe chronic interstitial lung disease. No new lung findings. No evidence of pneumonia. No pleural effusion or pneumothorax seen. No evidence of acute osseous abnormality. IMPRESSION: 1. Hyperexpanded lungs suggesting COPD/emphysema. Suspect associated chronic bronchitic changes centrally and chronic interstitial lung disease within the lower lobes. 2. No acute findings.  No evidence of pneumonia. Electronically Signed   By: Franki Cabot M.D.   On: 06/27/2015 19:42   Dg Chest 2 View  06/26/2015  CLINICAL DATA:  Cough and chest pain EXAM: CHEST  2 VIEW COMPARISON:  05/11/2015 FINDINGS: Cardiac shadow is stable. Postsurgical changes are noted. A pacing device is again seen. Bilateral breast implants are noted. The lungs are well aerated without focal infiltrate or sizable effusion. No acute bony abnormality is seen. IMPRESSION: No active cardiopulmonary disease. Electronically Signed   By: Inez Catalina M.D.   On: 06/26/2015 11:42    Micro Results      Recent Results (from the past 240 hour(s))  Urine culture     Status: Abnormal   Collection Time: 06/27/15  9:10 PM  Result Value Ref Range Status   Specimen Description URINE, RANDOM  Final   Special Requests NONE  Final   Culture MULTIPLE SPECIES PRESENT, SUGGEST RECOLLECTION (A)  Final   Report Status 06/28/2015 FINAL  Final  Culture, blood (Routine X 2) w Reflex to ID Panel     Status: None (Preliminary result)   Collection Time: 06/28/15 12:55 AM  Result Value Ref  Range Status   Specimen Description BLOOD LEFT HAND  Final   Special Requests AEROBIC BOTTLE ONLY 4ML  Final   Culture NO GROWTH 1 DAY  Final   Report Status PENDING  Incomplete  Culture, blood (Routine X 2) w Reflex to ID Panel     Status: None (Preliminary result)   Collection Time: 06/28/15  1:10 AM  Result Value Ref Range Status   Specimen Description BLOOD RIGHT HAND  Final   Special Requests IN PEDIATRIC BOTTLE 3ML  Final   Culture NO GROWTH 1 DAY  Final   Report Status PENDING  Incomplete    Today   Subjective    [name] today has no headache,no chest abdominal pain,no new weakness tingling or numbness, feels much better wants to go home today.    Objective   Blood pressure 116/61, pulse 77, temperature 98.6 F (37 C), temperature source Oral, resp. rate 18, height 5\' 5"  (1.651 m), weight 63.141  kg (139 lb 3.2 oz), SpO2 92 %.   Intake/Output Summary (Last 24 hours) at 06/29/15 1032 Last data filed at 06/29/15 0809  Gross per 24 hour  Intake   1425 ml  Output   2575 ml  Net  -1150 ml    Exam Awake Alert, Oriented x 3, No new F.N deficits, Normal affect [place].AT,PERRAL Supple Neck,No JVD, No cervical lymphadenopathy appriciated.  Symmetrical Chest wall movement, Good air movement bilaterally, CTAB RRR,No Gallops,Rubs or new Murmurs, No Parasternal Heave +ve B.Sounds, Abd Soft, Non tender, No organomegaly appriciated, No rebound -guarding or rigidity. No Cyanosis, Clubbing or edema, No new Rash or bruise   Data Review   CBC w Diff: Lab Results  Component Value Date   WBC 6.6 06/29/2015   WBC 7.0 11/17/2011   HGB 11.0* 06/29/2015   HGB 14.1 11/17/2011   HCT 34.0* 06/29/2015   HCT 44.5 11/17/2011   PLT 195 06/29/2015   LYMPHOPCT 11 06/27/2015   MONOPCT 10 06/27/2015   EOSPCT 3 06/27/2015   BASOPCT 1 06/27/2015    CMP: Lab Results  Component Value Date   NA 135 06/29/2015   K 4.3 06/29/2015   CL 100* 06/29/2015   CO2 26 06/29/2015   BUN 10  06/29/2015   CREATININE 0.89 06/29/2015   CREATININE 0.76 12/28/2014   PROT 5.7* 06/29/2015   ALBUMIN 2.5* 06/29/2015   BILITOT 0.2* 06/29/2015   ALKPHOS 195* 06/29/2015   AST 43* 06/29/2015   ALT 41 06/29/2015  .   Total Time in preparing paper work, data evaluation and todays exam - 35 minutes  Thurnell Lose M.D on 06/29/2015 at 10:32 AM  [facility]   Office  [phone]

## 2015-07-01 ENCOUNTER — Encounter (HOSPITAL_COMMUNITY): Payer: Commercial Managed Care - HMO

## 2015-07-01 NOTE — Progress Notes (Signed)
Patient discussed and examined with [name]. Agree with assessment and plan of care per her note.   

## 2015-07-02 ENCOUNTER — Telehealth (HOSPITAL_COMMUNITY): Payer: Self-pay | Admitting: *Deleted

## 2015-07-03 ENCOUNTER — Encounter (HOSPITAL_COMMUNITY): Payer: Commercial Managed Care - HMO

## 2015-07-03 LAB — CULTURE, BLOOD (ROUTINE X 2)
Culture: NO GROWTH
Culture: NO GROWTH

## 2015-07-04 ENCOUNTER — Encounter (HOSPITAL_COMMUNITY): Payer: Self-pay | Admitting: *Deleted

## 2015-07-04 NOTE — Progress Notes (Signed)
Cardiac Individual Treatment Plan  Patient Details  Name: [name] MRN: [id number] Date of Birth: 1940-02-17 Referring Provider:        CARDIAC REHAB PHASE II EXERCISE from 05/20/2015 in [place]   Referring Provider  Sanda Klein MD      Initial Encounter Date:       CARDIAC REHAB PHASE II EXERCISE from 05/20/2015 in [facility]   Date  05/03/15   Referring Provider  Croitoru, Mihai MD      Visit Diagnosis: No diagnosis found.  Patient's Home Medications on Admission:  Current outpatient prescriptions:  .  aspirin 81 MG EC tablet, Take 81 mg by mouth daily. , Disp: , Rfl:  .  azithromycin (ZITHROMAX) 250 MG tablet, Take 1 tablet (250 mg total) by mouth daily., Disp: 40 tablet, Rfl: 0 .  cholecalciferol (VITAMIN D) 1000 UNITS tablet, Take 1,000 Units by mouth daily., Disp: , Rfl:  .  citalopram (CELEXA) 20 MG tablet, Take 1 tablet (20 mg total) by mouth daily., Disp: 90 tablet, Rfl: 0 .  clopidogrel (PLAVIX) 75 MG tablet, Take 1 tablet (75 mg total) by mouth daily. (Patient not taking: Reported on 06/27/2015), Disp: 90 tablet, Rfl: 1 .  cyanocobalamin 500 MCG tablet, Take 1 tablet (500 mcg total) by mouth daily. (Patient taking differently: Take 1,000 mcg by mouth daily. ), Disp: 90 tablet, Rfl: 3 .  isosorbide mononitrate (IMDUR) 30 MG 24 hr tablet, Take 1 tablet (30 mg total) by mouth daily., Disp: 30 tablet, Rfl: 5 .  lisinopril (PRINIVIL,ZESTRIL) 10 MG tablet, Take 1 tablet (10 mg total) by mouth daily., Disp: 30 tablet, Rfl: 5 .  metoprolol tartrate (LOPRESSOR) 25 MG tablet, Take 1 tablet (25 mg total) by mouth 2 (two) times daily., Disp: 180 tablet, Rfl: 1 .  nitroGLYCERIN (NITROSTAT) 0.4 MG SL tablet, Place 1 tablet (0.4 mg total) under the tongue every 5 (five) minutes x 3 doses as needed for chest pain., Disp: 25 tablet, Rfl: 2 .  vitamin E 400 UNIT capsule, Take 400 Units by mouth daily., Disp: , Rfl:    Past Medical History: Past Medical History  Diagnosis Date  . GLUCOSE INTOLERANCE 12/31/2009  . HYPERLIPIDEMIA 12/31/2009  . ANXIETY 12/31/2009  . DEPRESSION 12/31/2009  . MITRAL REGURGITATION 12/31/2009  . HYPERTENSION 12/31/2009  . CORONARY ARTERY DISEASE 12/31/2009  . MITRAL VALVE PROLAPSE 12/31/2009  . MENOPAUSE, EARLY 12/31/2009  . Unspecified urinary incontinence 12/31/2009  . ABDOMINAL PAIN, LOWER 12/31/2009  . DIVERTICULITIS, HX OF 12/31/2009  . Pacemaker 01/21/2012    MDT Adapta dual chamber  . Tachycardia-bradycardia syndrome ([place])     [name] 01/21/2012  . PAD (peripheral artery disease) ([place])   . Heart murmur     "used to say I did" (01/21/2012)  . OSTEOARTHRITIS, HAND 12/31/2009  . [place] DISEASE, CERVICAL 12/31/2009  . [place] DISEASE, LUMBAR 12/31/2009  . Symptomatic sinus bradycardia 01/22/2012  . PAF (paroxysmal atrial fibrillation) ([place]) 01/22/2012  . Sinus node dysfunction ([place]) 01/22/2012  . PVD (peripheral vascular disease), hx stents to bil SFAs 02/2010 01/22/2012  . PAD (peripheral artery disease) ([place])   . Atrial fibrillation with RVR ([place])   . NSTEMI (non-ST elevated myocardial infarction) ([place]) 05/11/2015    Tobacco Use: History  Smoking status  . Former Smoker -- 0.75 packs/day for 10 years  . Types: Cigarettes  Smokeless tobacco  . Never Used    Comment: 01/21/2012 "quit smoking ~ 2002"  Labs: Recent Review Flowsheet Data    Labs for ITP Cardiac and Pulmonary Rehab Latest Ref Rng 01/04/2015 01/05/2015 01/31/2015 02/01/2015 05/12/2015   Cholestrol 0 - 200 mg/dL - - - 124 158   LDLCALC 0 - 99 mg/dL - - - 68 71   HDL >40 mg/dL - - - 34(L) 64   Trlycerides <150 mg/dL - - - 110 114   Hemoglobin A1c 4.8 - 5.6 % - - 6.4(H) - -   PHART 7.350 - 7.450 7.344(L) - - - -   PCO2ART 35.0 - 45.0 mmHg 41.7 - - - -   HCO3 20.0 - 24.0 mEq/L 22.6 - - - -   TCO2 0 - 100 mmol/L 24 28 - - -   ACIDBASEDEF 0.0 - 2.0 mmol/L 3.0(H) - - - -   O2SAT - 96.0 - - - -       Capillary Blood Glucose: Lab Results  Component Value Date   GLUCAP 119* 01/12/2015   GLUCAP 107* 01/11/2015   GLUCAP 102* 01/08/2015   GLUCAP 116* 01/08/2015   GLUCAP 106* 01/07/2015     Exercise Target Goals:    Exercise Program Goal: Individual exercise prescription set with THRR, safety & activity barriers. Participant demonstrates ability to understand and report RPE using BORG scale, to self-measure pulse accurately, and to acknowledge the importance of the exercise prescription.  Exercise Prescription Goal: Starting with aerobic activity 30 plus minutes a day, 3 days per week for initial exercise prescription. Provide home exercise prescription and guidelines that participant acknowledges understanding prior to discharge.  Activity Barriers & Risk Stratification:   6 Minute Walk:   Initial Exercise Prescription:     Initial Exercise Prescription - 05/23/15 1400    Date of Initial Exercise RX and Referring Provider   Date 05/03/15   Referring Provider Croitoru, Mihai MD   Bike   Level 0.9   Minutes 10   METs 3.68   NuStep   Level 4   Minutes 10   METs 3   Track   Laps 16   Minutes 10   METs 3.6   Resistance Training   Training Prescription Yes   Weight 2 lbs   Reps 10-12      Perform Capillary Blood Glucose checks as needed.  Exercise Prescription Changes:     Exercise Prescription Changes      05/07/15 1300 05/10/15 1000 05/13/15 1300 06/03/15 1300 06/11/15 0800   Exercise Review   Progression No  Pt's first full day of exercise was 05/06/15 No  Pt's first full day of exercise was 05/06/15 Yes No  just returning from readmission Yes   Response to Exercise   Blood Pressure (Admit) 124/66 mmHg 124/66 mmHg 138/80 mmHg 128/80 mmHg 118/70 mmHg   Blood Pressure (Exercise) 138/70 mmHg 138/70 mmHg 182/80 mmHg 140/80 mmHg 140/76 mmHg   Blood Pressure (Exit) 104/64 mmHg 104/64 mmHg 124/78 mmHg 122/77 mmHg 120/60 mmHg   Heart Rate (Admit) 77 bpm  77 bpm 96 bpm 79 bpm 86 bpm   Heart Rate (Exercise) 106 bpm 106 bpm 106 bpm 110 bpm 112 bpm   Heart Rate (Exit) 75 bpm 75 bpm 75 bpm 79 bpm 76 bpm   Rating of Perceived Exertion (Exercise) 12 12 13 10 13    Comments  Home Exercise Given 05/10/15 Home Exercise Given 05/10/15 Home Exercise Given 05/10/15 Home Exercise Given 05/10/15   Duration Progress to 30 minutes of continuous aerobic without signs/symptoms of physical distress Progress to 30 minutes  of continuous aerobic without signs/symptoms of physical distress Progress to 30 minutes of continuous aerobic without signs/symptoms of physical distress Progress to 30 minutes of continuous aerobic without signs/symptoms of physical distress Progress to 30 minutes of continuous aerobic without signs/symptoms of physical distress   Intensity THRR unchanged THRR unchanged THRR unchanged THRR unchanged THRR unchanged   Progression   Progression Continue to progress workloads to maintain intensity without signs/symptoms of physical distress. Continue to progress workloads to maintain intensity without signs/symptoms of physical distress. Continue to progress workloads to maintain intensity without signs/symptoms of physical distress. Continue to progress workloads to maintain intensity without signs/symptoms of physical distress. Continue to progress workloads to maintain intensity without signs/symptoms of physical distress.   Average METs 3.1 3.1 3.4 3 3.6   Resistance Training   Training Prescription Yes Yes Yes Yes Yes   Weight 2 lbs 2 lbs 2 lbs 2 lbs 2 lbs   Reps 10-12 10-12 10-12 10-12 10-12   Interval Training   Interval Training   No No No   Bike   Level 0.9 0.9 0.9 0.9 0.9   Minutes 10 10 10 10 10    METs 3.63 3.63 3.68 3.68 3.67   NuStep   Level 3 3 4 4 4    Minutes 10 10 10 10 10    METs 2.6 2.6 3 2.5 3.6   Track   Laps 12 12 16 10 15    Minutes 10 10 10 10 10    METs 3.07 3.07 3.6 2.76 3.6   Home Exercise Plan   Plans to continue exercise  at  Home  walking and treadmill Home  walking and treadmill Home  walking and treadmill Home  walking and treadmill   Frequency  Add 3 additional days to program exercise sessions. Add 3 additional days to program exercise sessions. Add 3 additional days to program exercise sessions. Add 3 additional days to program exercise sessions.      Exercise Comments:     Exercise Comments      05/07/15 1325 05/28/15 1016 06/03/15 1336 06/11/15 0836     Exercise Comments Pt has recently started the program and is tolerating exercise well.  Will continue to monitor for exercise progression. Pt was out due to another MI.  She has been cleared to return. Pt has just returned to program after readmission with new MI.  She tolerated exercise well and was able to return to her previous workloads. She has done well returning to exercise.  Will continue to monitor.       Discharge Exercise Prescription (Final Exercise Prescription Changes):     Exercise Prescription Changes - 06/11/15 0800    Exercise Review   Progression Yes   Response to Exercise   Blood Pressure (Admit) 118/70 mmHg   Blood Pressure (Exercise) 140/76 mmHg   Blood Pressure (Exit) 120/60 mmHg   Heart Rate (Admit) 86 bpm   Heart Rate (Exercise) 112 bpm   Heart Rate (Exit) 76 bpm   Rating of Perceived Exertion (Exercise) 13   Comments Home Exercise Given 05/10/15   Duration Progress to 30 minutes of continuous aerobic without signs/symptoms of physical distress   Intensity THRR unchanged   Progression   Progression Continue to progress workloads to maintain intensity without signs/symptoms of physical distress.   Average METs 3.6   Resistance Training   Training Prescription Yes   Weight 2 lbs   Reps 10-12   Interval Training   Interval Training No   Bike  Level 0.9   Minutes 10   METs 3.67   NuStep   Level 4   Minutes 10   METs 3.6   Track   Laps 15   Minutes 10   METs 3.6   Home Exercise Plan   Plans to  continue exercise at Home  walking and treadmill   Frequency Add 3 additional days to program exercise sessions.      Nutrition:  Target Goals: Understanding of nutrition guidelines, daily intake of sodium 1500mg , cholesterol 200mg , calories 30% from fat and 7% or less from saturated fats, daily to have 5 or more servings of fruits and vegetables.  Biometrics:    Nutrition Therapy Plan and Nutrition Goals:   Nutrition Discharge: Nutrition Scores:     Nutrition Assessments - 06/03/15 1033    MEDFICTS Scores   Pre Score 29      Nutrition Goals Re-Evaluation:     Nutrition Goals Re-Evaluation      06/03/15 1057           Personal Goal #1 Re-Evaluation   Personal Goal #1 6-10 lb wt loss from current wt of 142.6 lb at graduation from Cardiac Rehab       Goal Progress Seen No       Comments Pt has maintained her wt       Personal Goal #2 Re-Evaluation   Personal Goal #2 Establish healthy eating patterns, emphasizing a variety of nutrient-dense foods in appropriate portion sizes, to improve overall health and specifically to attain blood glucose control through diet.       Goal Progress Seen Yes       Comments Pt following Step 2 of the Therapeutic Lifestyle Changes diet.        Intervention Plan   Intervention Continue to educate, counsel and set short/long term goals regarding individualized specific personal dietary modifications.;Nutrition handout(s) given to patient.  Pre-diabetes handout given          Psychosocial: Target Goals: Acknowledge presence or absence of depression, maximize coping skills, provide positive support system. Participant is able to verbalize types and ability to use techniques and skills needed for reducing stress and depression.  Initial Review & Psychosocial Screening:   Quality of Life Scores:     Quality of Life - 05/09/15 1227    Quality of Life Scores   Health/Function Pre --  Hoyle Sauer reported that she has experienced some  shortness of breath but it is getting better      PHQ-9:     Recent Review Flowsheet Data    Depression screen [facility] 2/9 05/03/2015 03/16/2015 01/23/2015 08/06/2014   Decreased Interest 0 0 0 3   Down, Depressed, Hopeless 0 0 0 0   PHQ - 2 Score 0 0 0 3   Altered sleeping - - - 1   Tired, decreased energy - - - 3   Change in appetite - - - 0   Feeling bad or failure about yourself  - - - 0   Trouble concentrating - - - 0   Moving slowly or fidgety/restless - - - 3   Suicidal thoughts - - - 0   PHQ-9 Score - - - 10   Difficult doing work/chores - - - Not difficult at all      Psychosocial Evaluation and Intervention:   Psychosocial Re-Evaluation:     Psychosocial Re-Evaluation      06/03/15 1635 07/04/15 1547         Psychosocial Re-Evaluation  Interventions Encouraged to attend Cardiac Rehabilitation for the exercise Stress management education      Comments  Ms Fuerte's cardiac rehab is on hold right now due to recent medical issues      Continued Psychosocial Services Needed No --  will see if the patient will benfit from counselling when she returns to exercise.         Vocational Rehabilitation: Provide vocational rehab assistance to qualifying candidates.   Vocational Rehab Evaluation & Intervention:   Education: Education Goals: Education classes will be provided on a weekly basis, covering required topics. Participant will state understanding/return demonstration of topics presented.  Learning Barriers/Preferences:   Education Topics: Count Your Pulse:  -Group instruction provided by verbal instruction, demonstration, patient participation and written materials to support subject.  Instructors address importance of being able to find your pulse and how to count your pulse when at home without a heart monitor.  Patients get hands on experience counting their pulse with staff help and individually.   Heart Attack, Angina, and Risk Factor Modification:   -Group instruction provided by verbal instruction, video, and written materials to support subject.  Instructors address signs and symptoms of angina and heart attacks.    Also discuss risk factors for heart disease and how to make changes to improve heart health risk factors.   Functional Fitness:  -Group instruction provided by verbal instruction, demonstration, patient participation, and written materials to support subject.  Instructors address safety measures for doing things around the house.  Discuss how to get up and down off the floor, how to pick things up properly, how to safely get out of a chair without assistance, and balance training.          CARDIAC REHAB PHASE II EXERCISE from 06/03/2015 in [facility]   Date  05/03/15   Educator  Alberteen Sam, MA ACSM RCEP   Instruction Review Code  2- meets goals/outcomes      Meditation and Mindfulness:  -Group instruction provided by verbal instruction, patient participation, and written materials to support subject.  Instructor addresses importance of mindfulness and meditation practice to help reduce stress and improve awareness.  Instructor also leads participants through a meditation exercise.    Stretching for Flexibility and Mobility:  -Group instruction provided by verbal instruction, patient participation, and written materials to support subject.  Instructors lead participants through series of stretches that are designed to increase flexibility thus improving mobility.  These stretches are additional exercise for major muscle groups that are typically performed during regular warm up and cool down.   Hands Only CPR Anytime:  -Group instruction provided by verbal instruction, video, patient participation and written materials to support subject.  Instructors co-teach with AHA video for hands only CPR.  Participants get hands on experience with mannequins.   Nutrition I class: Heart Healthy  Eating:  -Group instruction provided by PowerPoint slides, verbal discussion, and written materials to support subject matter. The instructor gives an explanation and review of the Therapeutic Lifestyle Changes diet recommendations, which includes a discussion on lipid goals, dietary fat, sodium, fiber, plant stanol/sterol esters, sugar, and the components of a well-balanced, healthy diet.      CARDIAC REHAB PHASE II EXERCISE from 06/03/2015 in [facility]   Date  06/03/15   Educator  RD   Instruction Review Code  Not applicable [Class handouts given]      Nutrition II class: Lifestyle Skills:  -Group instruction provided  by PowerPoint slides, verbal discussion, and written materials to support subject matter. The instructor gives an explanation and review of label reading, grocery shopping for heart health, heart healthy recipe modifications, and ways to make healthier choices when eating out.      CARDIAC REHAB PHASE II EXERCISE from 06/03/2015 in [place] [facility]   Date  06/03/15   Educator  RD   Instruction Review Code  Not applicable [Class handouts given]      Diabetes Question & Answer:  -Group instruction provided by PowerPoint slides, verbal discussion, and written materials to support subject matter. The instructor gives an explanation and review of diabetes co-morbidities, pre- and post-prandial blood glucose goals, pre-exercise blood glucose goals, signs, symptoms, and treatment of hypoglycemia and hyperglycemia, and foot care basics.   Diabetes Blitz:  -Group instruction provided by PowerPoint slides, verbal discussion, and written materials to support subject matter. The instructor gives an explanation and review of the physiology behind type 1 and type 2 diabetes, diabetes medications and rational behind using different medications, pre- and post-prandial blood glucose recommendations and Hemoglobin A1c goals, diabetes diet,  and exercise including blood glucose guidelines for exercising safely.    Portion Distortion:  -Group instruction provided by PowerPoint slides, verbal discussion, written materials, and food models to support subject matter. The instructor gives an explanation of serving size versus portion size, changes in portions sizes over the last 20 years, and what consists of a serving from each food group.   Stress Management:  -Group instruction provided by verbal instruction, video, and written materials to support subject matter.  Instructors review role of stress in heart disease and how to cope with stress positively.     Exercising on Your Own:  -Group instruction provided by verbal instruction, power point, and written materials to support subject.  Instructors discuss benefits of exercise, components of exercise, frequency and intensity of exercise, and end points for exercise.  Also discuss use of nitroglycerin and activating EMS.  Review options of places to exercise outside of rehab.  Review guidelines for sex with heart disease.   Cardiac Drugs I:  -Group instruction provided by verbal instruction and written materials to support subject.  Instructor reviews cardiac drug classes: antiplatelets, anticoagulants, beta blockers, and statins.  Instructor discusses reasons, side effects, and lifestyle considerations for each drug class.   Cardiac Drugs II:  -Group instruction provided by verbal instruction and written materials to support subject.  Instructor reviews cardiac drug classes: angiotensin converting enzyme inhibitors (ACE-I), angiotensin II receptor blockers (ARBs), nitrates, and calcium channel blockers.  Instructor discusses reasons, side effects, and lifestyle considerations for each drug class.   Anatomy and Physiology of the Circulatory System:  -Group instruction provided by verbal instruction, video, and written materials to support subject.  Reviews functional anatomy of  heart, how it relates to various diagnoses, and what role the heart plays in the overall system.   Knowledge Questionnaire Score:   Core Components/Risk Factors/Patient Goals at Admission:   Core Components/Risk Factors/Patient Goals Review:    Core Components/Risk Factors/Patient Goals at Discharge (Final Review):    ITP Comments:   Comments: Pt is making expected progress toward personal goals after completing 6 sessions. Recommend continued exercise and life style modification education including  stress management and relaxation techniques to decrease cardiac risk profile. Hoyle Sauer has not participated in exercise since 06/05/2015. Carolyn's cardiac rehab is on hold due to her recent medical issues and hospitalization. Will hold off on cardiac  rehab we receive instructions on how to proceed with exercise from Dr [employer] office. Patient is agreeable  to the plan.

## 2015-07-05 ENCOUNTER — Ambulatory Visit: Payer: Commercial Managed Care - HMO | Admitting: Cardiovascular Disease

## 2015-07-10 ENCOUNTER — Encounter (HOSPITAL_COMMUNITY): Payer: Commercial Managed Care - HMO

## 2015-07-11 ENCOUNTER — Ambulatory Visit (HOSPITAL_COMMUNITY): Payer: Commercial Managed Care - HMO | Attending: Cardiology

## 2015-07-11 ENCOUNTER — Other Ambulatory Visit: Payer: Self-pay

## 2015-07-11 DIAGNOSIS — E785 Hyperlipidemia, unspecified: Secondary | ICD-10-CM | POA: Insufficient documentation

## 2015-07-11 DIAGNOSIS — I351 Nonrheumatic aortic (valve) insufficiency: Secondary | ICD-10-CM | POA: Diagnosis not present

## 2015-07-11 DIAGNOSIS — R0600 Dyspnea, unspecified: Secondary | ICD-10-CM | POA: Insufficient documentation

## 2015-07-11 DIAGNOSIS — I77810 Thoracic aortic ectasia: Secondary | ICD-10-CM | POA: Insufficient documentation

## 2015-07-11 DIAGNOSIS — I739 Peripheral vascular disease, unspecified: Secondary | ICD-10-CM | POA: Insufficient documentation

## 2015-07-11 DIAGNOSIS — I119 Hypertensive heart disease without heart failure: Secondary | ICD-10-CM | POA: Diagnosis not present

## 2015-07-11 DIAGNOSIS — I2510 Atherosclerotic heart disease of native coronary artery without angina pectoris: Secondary | ICD-10-CM | POA: Diagnosis not present

## 2015-07-11 DIAGNOSIS — I340 Nonrheumatic mitral (valve) insufficiency: Secondary | ICD-10-CM | POA: Insufficient documentation

## 2015-07-15 ENCOUNTER — Ambulatory Visit (INDEPENDENT_AMBULATORY_CARE_PROVIDER_SITE_OTHER): Payer: Commercial Managed Care - HMO | Admitting: Cardiovascular Disease

## 2015-07-15 ENCOUNTER — Encounter: Payer: Self-pay | Admitting: Cardiovascular Disease

## 2015-07-15 VITALS — BP 132/82 | HR 83 | Ht 64.5 in | Wt 132.6 lb

## 2015-07-15 DIAGNOSIS — E785 Hyperlipidemia, unspecified: Secondary | ICD-10-CM

## 2015-07-15 DIAGNOSIS — I739 Peripheral vascular disease, unspecified: Secondary | ICD-10-CM

## 2015-07-15 DIAGNOSIS — I495 Sick sinus syndrome: Secondary | ICD-10-CM

## 2015-07-15 DIAGNOSIS — I480 Paroxysmal atrial fibrillation: Secondary | ICD-10-CM

## 2015-07-15 DIAGNOSIS — Z79899 Other long term (current) drug therapy: Secondary | ICD-10-CM

## 2015-07-15 DIAGNOSIS — I25118 Atherosclerotic heart disease of native coronary artery with other forms of angina pectoris: Secondary | ICD-10-CM | POA: Diagnosis not present

## 2015-07-15 DIAGNOSIS — I214 Non-ST elevation (NSTEMI) myocardial infarction: Secondary | ICD-10-CM

## 2015-07-15 DIAGNOSIS — Z950 Presence of cardiac pacemaker: Secondary | ICD-10-CM

## 2015-07-15 DIAGNOSIS — I10 Essential (primary) hypertension: Secondary | ICD-10-CM

## 2015-07-15 MED ORDER — ROSUVASTATIN CALCIUM 10 MG PO TABS: 10.0000 mg | ORAL_TABLET | Freq: Every day | ORAL | Status: DC

## 2015-07-15 NOTE — Patient Instructions (Signed)
Medication Instructions: Dr Sallyanne Kuster has recommended making the following medication changes: 1. START Rosuvastatin 10 mg - take 1 tablet daily  Labwork: Your physician recommends that you return for lab work in 3 months - FASTING.  Testing/Procedures: NONE ORDERED  Follow-up: Dr Sallyanne Kuster recommends that you schedule a follow-up appointment in 3 months.  If you need a refill on your cardiac medications before your next appointment, please call your pharmacy.   You have been referred back to cardiac rehab.

## 2015-07-16 ENCOUNTER — Ambulatory Visit: Payer: Commercial Managed Care - HMO | Admitting: Cardiology

## 2015-07-16 LAB — CUP PACEART INCLINIC DEVICE CHECK
Battery Impedance: 253 Ohm
Battery Remaining Longevity: 103 mo
Battery Voltage: 2.79 V
Brady Statistic AP VP Percent: 0 %
Brady Statistic AP VS Percent: 96 %
Brady Statistic AS VP Percent: 0 %
Brady Statistic AS VS Percent: 4 %
Date Time Interrogation Session: 20170605181459
Implantable Lead Implant Date: 20131212
Implantable Lead Implant Date: 20131212
Implantable Lead Location: 753859
Implantable Lead Location: 753860
Implantable Lead Model: 5076
Implantable Lead Model: 5076
Lead Channel Impedance Value: 373 Ohm
Lead Channel Impedance Value: 461 Ohm
Lead Channel Pacing Threshold Amplitude: 0.75 V
Lead Channel Pacing Threshold Amplitude: 1 V
Lead Channel Pacing Threshold Pulse Width: 0.4 ms
Lead Channel Pacing Threshold Pulse Width: 0.4 ms
Lead Channel Sensing Intrinsic Amplitude: 11.2 mV
Lead Channel Setting Pacing Amplitude: 2 V
Lead Channel Setting Pacing Amplitude: 2.5 V
Lead Channel Setting Pacing Pulse Width: 0.4 ms
Lead Channel Setting Sensing Sensitivity: 4 mV

## 2015-07-16 NOTE — Progress Notes (Signed)
Patient ID: [name], female   DOB: October 14, 1940, 75 y.o.   MRN: [id number]     Cardiology Office Note    Date:  07/16/2015   ID:  [name], DOB [birth year], MRN [id number]  PCP:  [name], MD  Cardiologist:   Sanda Klein, MD   Chief Complaint  Patient presents with  . Follow-up    6 weeks per [facility], PA-C  pt c/o SOB on exertion; dizziness when she gets up--is getting better; no other Sx.    History of Present Illness:  [name] is a 75 y.o. female with coronary artery disease and recent bypass surgery. She has had a very difficult year.   After undergoing bypass surgery in November 2016 she returned with non-ST segment elevation myocardial infarction in April 2017 and required placement of stents both in the native right coronary artery (3.020 mm Synergy DES) and in the saphenous vein graft to the distal right coronary artery (2.7538 mm Synergy DES) due to early graft dysfunction. The saphenous vein graft to the ramus intermedius was also occluded, but this vessel was left for medical therapy. She developed breathlessness with Brilinta and is now taking clopidogrel.  On May 18 she was hospitalized with fever, cough and severe dyspnea and hypoxemia. No lung infiltrates were seen and she was diagnosed with bronchitis, treated with antibiotics. It has taken her long time to recover and she is still little dyspneic and has occasional coughing. Mostly she complains of fatigue. She has not had chest pain or palpitations and denies syncope.  Just before her febrile illness began she was complaining of a lot of leg weakness and we had temporarily stopped her statin after phone discussion. She has not restarted it since. While on statin, her lipid profile was excellent.  Pacemaker interrogation today shows normal device function. Her Medtronic Adapta dual-chamber device was implanted in 2013 and has another 8.5 years of estimated longevity. Lead parameters are all within  normal range. She has 96% atrial pacing and only 0.1% ventricular pacing. She has had one meaningful episode of atrial fibrillation that occurred on March 31 and lasted for just under 5 minutes. Ventricular rate control was good during that event.  She has normal left ventricular systolic function. She does have evidence of grade 2 diastolic dysfunction on previous echo. She takes warfarin for paroxysmal atrial fibrillation. At the time of her bypass surgery she received an Atricure left atrial appendage clip. She is currently on aspirin and clopidogrel. The pacemaker was implanted in 2013 for symptomatic sinus bradycardia. She also has a history of peripheral arterial disease and received bilateral superficial femoral artery stents about 3 years ago. She has never smoked but drinks daily. She has treated hypertension.  Past Medical History  Diagnosis Date  . GLUCOSE INTOLERANCE 12/31/2009  . HYPERLIPIDEMIA 12/31/2009  . ANXIETY 12/31/2009  . DEPRESSION 12/31/2009  . MITRAL REGURGITATION 12/31/2009  . HYPERTENSION 12/31/2009  . CORONARY ARTERY DISEASE 12/31/2009  . MITRAL VALVE PROLAPSE 12/31/2009  . MENOPAUSE, EARLY 12/31/2009  . Unspecified urinary incontinence 12/31/2009  . ABDOMINAL PAIN, LOWER 12/31/2009  . DIVERTICULITIS, HX OF 12/31/2009  . Pacemaker 01/21/2012    MDT Adapta dual chamber  . Tachycardia-bradycardia syndrome ([place])     [name] 01/21/2012  . PAD (peripheral artery disease) ([place])   . Heart murmur     "used to say I did" (01/21/2012)  . OSTEOARTHRITIS, HAND 12/31/2009  . [place] DISEASE, CERVICAL 12/31/2009  . [place] DISEASE, LUMBAR 12/31/2009  .  Symptomatic sinus bradycardia 01/22/2012  . PAF (paroxysmal atrial fibrillation) ([place]) 01/22/2012  . Sinus node dysfunction ([place]) 01/22/2012  . PVD (peripheral vascular disease), hx stents to bil SFAs 02/2010 01/22/2012  . PAD (peripheral artery disease) ([place])   . Atrial fibrillation with RVR ([place])   . NSTEMI (non-ST elevated  myocardial infarction) ([place]) 05/11/2015    Past Surgical History  Procedure Laterality Date  . Cardiac catheterization  2005    "negative" (01/21/2012)  . Oophorectomy  ~1979  . Partial colectomy  2010  . Carpal tunnel release  2008    "right hand/thumb; carpal tunnel repair; got rid of arthritis" (01/21/2012)  . Insert / replace / remove pacemaker  01/21/2012    initial placement  . Vaginal hysterectomy  1975  . Posterior cervical laminectomy  1985  . Peripheral arterial stent graft  2012; 2012    "LLE; RLE" (01/21/2012)  . Facelift, lower 2/3  1995    "mini" (01/21/2012)  . Augmentation mammaplasty  1983  . Pacemaker insertion  01/21/2012    Medtronic Adapta, model# ADDRL1, serial# [id number]  . Lower arterial examination  10/28/2011    R. SFA stent mild-moderate mixed density plaque with elevated velocities consistent with 50% diameter reduction. L. SFA stent moderate mixed denisty plaque at mid to distal level consistent with 50-69% diameter reduction.  . Cardiac catheterization  09/17/1999    Mild CAD involving proximal portion of LAD. Mitral valve prolapse w/ mitral regurg. No evidence of renal artery stenosis. Recommended readjustment of antihypertensive medications.  . Persantine myoview stress test  02/25/2010    No scintigraphic evidence of inducible myocardial ischemia. No persantine EKG changes. Non-diagnositc for ishcemia. Low-risk, normal scan.  Marland Kitchen Permanent pacemaker insertion N/A 01/21/2012    Procedure: PERMANENT PACEMAKER INSERTION;  Surgeon: Sanda Klein, MD;  Location: [facility] CATH LAB;  Service: Cardiovascular;  Laterality: N/A;  . Cardiac catheterization N/A 01/01/2015    Procedure: Left Heart Cath and Coronary Angiography;  Surgeon: Jettie Booze, MD;  Location: [facility] CV [facility];  Service: Cardiovascular;  Laterality: N/A;  . Coronary artery bypass graft N/A 01/04/2015    Procedure: CORONARY ARTERY BYPASS GRAFTING (CABG) x 3 using left internal mammory artery and  greater saphenous vein right leg harvested endoscopically.;  Surgeon: Grace Isaac, MD;  LIMA-LAD, SVG-RI, SVG-PDA  . Tee without cardioversion N/A 01/04/2015    Procedure: TRANSESOPHAGEAL ECHOCARDIOGRAM (TEE);  Surgeon: Grace Isaac, MD;  Location: [facility];  Service: Open Heart Surgery;  Laterality: N/A;  . Clipping of atrial appendage N/A 01/04/2015    Procedure: CLIPPING OF ATRIAL APPENDAGE;  Surgeon: Grace Isaac, MD;  Location: [facility];  Service: Open Heart Surgery;  Laterality: N/A;  . Cardioversion N/A 02/01/2015    Procedure: CARDIOVERSION;  Surgeon: Lelon Perla, MD;  Location[facility] ENDOSCOPY;  Service: Cardiovascular;  Laterality: N/A;  . Tee without cardioversion N/A 02/01/2015    Procedure: TRANSESOPHAGEAL ECHOCARDIOGRAM (TEE);  Surgeon: Lelon Perla, MD;  Location: [facility] ENDOSCOPY;  Service: Cardiovascular;  Laterality: N/A;  . Cardiac surgery      11/20/20116  . Cardiac catheterization N/A 05/12/2015    Procedure: Left Heart Cath and Coronary Angiography;  Surgeon: Troy Sine, MD;  Location: [place] CV LAB;  Service: Cardiovascular;  Laterality: N/A;  . Cardiac catheterization N/A 05/12/2015    Procedure: Coronary Stent Intervention;  Surgeon: Troy Sine, MD;  Location: [place] CV LAB;  Service: Cardiovascular;  Laterality: N/A;    Current Medications: Outpatient Prescriptions  Prior to Visit  Medication Sig Dispense Refill  . aspirin 81 MG EC tablet Take 81 mg by mouth daily.     . cholecalciferol (VITAMIN D) 1000 UNITS tablet Take 1,000 Units by mouth daily.    . citalopram (CELEXA) 20 MG tablet Take 1 tablet (20 mg total) by mouth daily. 90 tablet 0  . clopidogrel (PLAVIX) 75 MG tablet Take 1 tablet (75 mg total) by mouth daily. 90 tablet 1  . cyanocobalamin 500 MCG tablet Take 1 tablet (500 mcg total) by mouth daily. (Patient taking differently: Take 1,000 mcg by mouth daily. ) 90 tablet 3  . isosorbide mononitrate (IMDUR) 30 MG 24 hr tablet Take 1  tablet (30 mg total) by mouth daily. 30 tablet 5  . lisinopril (PRINIVIL,ZESTRIL) 10 MG tablet Take 1 tablet (10 mg total) by mouth daily. 30 tablet 5  . metoprolol tartrate (LOPRESSOR) 25 MG tablet Take 1 tablet (25 mg total) by mouth 2 (two) times daily. 180 tablet 1  . nitroGLYCERIN (NITROSTAT) 0.4 MG SL tablet Place 1 tablet (0.4 mg total) under the tongue every 5 (five) minutes x 3 doses as needed for chest pain. 25 tablet 2  . vitamin E 400 UNIT capsule Take 400 Units by mouth daily.    Marland Kitchen azithromycin (ZITHROMAX) 250 MG tablet Take 1 tablet (250 mg total) by mouth daily. 40 tablet 0   No facility-administered medications prior to visit.     Allergies:   Brilinta; Clonidine derivatives; Atorvastatin; and Exforge   Social History   Social History  . Marital Status: Widowed    Spouse Name: N/A  . Number of Children: 3  . Years of Education: N/A   Occupational History  . retired Chemical engineer    Social History Main Topics  . Smoking status: Former Smoker -- 0.75 packs/day for 10 years    Types: Cigarettes  . Smokeless tobacco: Never Used     Comment: 01/21/2012 "quit smoking ~ 2002"  . Alcohol Use: Yes     Comment: 01/21/2012 "3 times/wk I have a couple mixed drinks"; 06/28/2015 "nothing in the last 3 weeks cause I haven't felt good"  . Drug Use: No  . Sexual Activity: Not Currently   Other Topics Concern  . None   Social History Narrative     Family History:  The patient's family history includes Heart disease in her brother and mother; Hypertension in her brother, brother, and mother; Stroke in her brother, father, and mother.   ROS:   Please see the history of present illness.    ROS All other systems reviewed and are negative.   PHYSICAL EXAM:   VS:  BP 132/82 mmHg  Pulse 83  Ht 5' 4.5" (1.638 m)  Wt 60.147 kg (132 lb 9.6 oz)  BMI 22.42 kg/m2   GEN: Well nourished, well developed, in no acute distress HEENT: normal Neck: no JVD, carotid bruits, or  masses Cardiac: RRR; no murmurs, rubs, or gallops,no edema  Respiratory:  clear to auscultation bilaterally, normal work of breathing GI: soft, nontender, nondistended, + BS MS: no deformity or atrophy Skin: warm and dry, no rash Neuro:  Alert and Oriented x 3, Strength and sensation are intact Psych: euthymic mood, full affect  Wt Readings from Last 3 Encounters:  07/15/15 60.147 kg (132 lb 9.6 oz)  06/29/15 63.141 kg (139 lb 3.2 oz)  06/27/15 59.421 kg (131 lb)      Studies/Labs Reviewed:   EKG:  EKG is not ordered today.  The ekg ordered 06/28/15 demonstrates atrial paced, ventricular sensed rhythm with nonspecific lateral ST segment changes  Recent Labs: 05/12/2015: Magnesium 1.9; TSH 5.390* 06/26/2015: B Natriuretic Peptide 427.7* 06/29/2015: ALT 41; BUN 10; Creatinine, Ser 0.89; Hemoglobin 11.0*; Platelets 195; Potassium 4.3; Sodium 135   Lipid Panel    Component Value Date/Time   CHOL 158 05/12/2015 0324   TRIG 114 05/12/2015 0324   HDL 64 05/12/2015 0324   CHOLHDL 2.5 05/12/2015 0324   VLDL 23 05/12/2015 0324   LDLCALC 71 05/12/2015 0324     ASSESSMENT:    1. Coronary artery disease involving native coronary artery of native heart with other form of angina pectoris ([place])   2. NSTEMI (non-ST elevated myocardial infarction) ([place])   3. Dyslipidemia   4. Sinus node dysfunction ([facility])   5. PAF (paroxysmal atrial fibrillation) ([place])   6. S/P placement of cardiac pacemaker, medtronic adapta 01/21/12   7. PVD (peripheral vascular disease), hx stents to bil SFAs 02/2010   8. Essential hypertension   9. Medication management      PLAN:  In order of problems listed above:  1. CAD: Currently without symptoms of angina pectoris. Unfortunate she had early failure over 2 saphenous vein grafts, one of which was treated with a drug-eluting stent. Her mammary artery bypass was widely patent. Preserved left ventricular systolic function. At this point the focus is on risk factor  control. Reviewed the importance of dual antiplatelet therapy with recently placed drug-eluting stents. Aspirin clopidogrel should not be interrupted for any reason other than truly life-threatening bleeding in the first 3 months. 2. Recent NSTEMI: Cardiac rehabilitation was interrupted by her respiratory illness and she wants to return to exercise. Plan to continue dual antiplatelet therapy at least through April 2018. 3. HLP: Not sure whether her weakness was truly statin related or a prodrome of her subsequent respiratory illness. Will restart treatment with a statin. 4. SSS: Her pacemaker shows good distribution of heart rate histogram. Her fatigue cannot be attributed to chronotropic incompetence. She is on a relatively low dose of beta blocker. Let's see what happens when she starts exercising again. 5. AFib: Very low burden of arrhythmia. She has an atrial appendage clip and is no longer taking anticoagulants. 6. PPM: Normal device function. Remote download every 3 months and yearly office visits. 7. PAD: She describes fatigue rather than intermittent claudication. Bilateral lower extremity Doppler study showed normal ABIs and waveforms in both extremities. 8. HTN: Good blood pressure control    Medication Adjustments/Labs and Tests Ordered: Current medicines are reviewed at length with the patient today.  Concerns regarding medicines are outlined above.  Medication changes, Labs and Tests ordered today are listed in the Patient Instructions below. Patient Instructions  Medication Instructions: Dr Sallyanne Kuster has recommended making the following medication changes: 1. START Rosuvastatin 10 mg - take 1 tablet daily  Labwork: Your physician recommends that you return for lab work in 3 months - FASTING.  Testing/Procedures: NONE ORDERED  Follow-up: Dr Sallyanne Kuster recommends that you schedule a follow-up appointment in 3 months.  If you need a refill on your cardiac medications before your  next appointment, please call your pharmacy.   You have been referred back to cardiac rehab.    Signed, Sanda Klein, MD  07/16/2015 6:06 PM    [facility] [address], [place], [place]  [address] Phone: [phone]; Fax: [phone]

## 2015-07-17 ENCOUNTER — Telehealth: Payer: Self-pay | Admitting: *Deleted

## 2015-07-17 NOTE — Telephone Encounter (Signed)
Pt returned call. Recommendations given and patient verbalized understanding.

## 2015-07-17 NOTE — Telephone Encounter (Signed)
Patient left a msg on the refill vm requesting a call at [phone] as she is unclear about one of her medications and has questions. Thanks, MI

## 2015-07-17 NOTE — Telephone Encounter (Signed)
Spoke with pt, she was seen recently and she thought she was to stop a medication. There is no mention in dr croitoru's note regarding stopping any medications. Patient voiced understanding she was to start rosuvastatin. Pt would like to know from dr croitoru if she can stop any of her current medications. Will forward to dr croitoru

## 2015-07-17 NOTE — Telephone Encounter (Signed)
We did discuss stopping isosorbide mononitrate if she is not having any chest pain. She can do that at any time, just call me if she develops angina.

## 2015-07-17 NOTE — Telephone Encounter (Signed)
Left message for pt to call.

## 2015-07-22 ENCOUNTER — Encounter: Payer: Self-pay | Admitting: Cardiovascular Disease

## 2015-08-02 NOTE — Addendum Note (Signed)
Encounter addended by: Magda Kiel, RN on: 08/02/2015  9:15 AM<BR>     Documentation filed: Clinical Notes

## 2015-08-02 NOTE — Progress Notes (Signed)
Discharge Summary  Patient Details  Name: [name] MRN: [id number] Date of Birth: 05/23/1940 Referring Provider:        CARDIAC [facility]   Referring Provider  Croitoru, Mihai MD       Number of Visits: 6  Reason for Discharge:  Patient discharged due to nonattendance.  Smoking History:  History  Smoking status  . Former Smoker -- 0.75 packs/day for 10 years  . Types: Cigarettes  Smokeless tobacco  . Never Used    Comment: 01/21/2012 "quit smoking ~ 2002"    Diagnosis:  No diagnosis found.  ADL UCSD:   Initial Exercise Prescription:     Initial Exercise Prescription - 05/23/15 1400    Date of Initial Exercise RX and Referring Provider   Date 05/03/15   Referring Provider Croitoru, Mihai MD   Bike   Level 0.9   Minutes 10   METs 3.68   NuStep   Level 4   Minutes 10   METs 3   Track   Laps 16   Minutes 10   METs 3.6   Resistance Training   Training Prescription Yes   Weight 2 lbs   Reps 10-12      Discharge Exercise Prescription (Final Exercise Prescription Changes):     Exercise Prescription Changes - 06/11/15 0800    Exercise Review   Progression Yes   Response to Exercise   Blood Pressure (Admit) 118/70 mmHg   Blood Pressure (Exercise) 140/76 mmHg   Blood Pressure (Exit) 120/60 mmHg   Heart Rate (Admit) 86 bpm   Heart Rate (Exercise) 112 bpm   Heart Rate (Exit) 76 bpm   Rating of Perceived Exertion (Exercise) 13   Comments Home Exercise Given 05/10/15   Duration Progress to 30 minutes of continuous aerobic without signs/symptoms of physical distress   Intensity THRR unchanged   Progression   Progression Continue to progress workloads to maintain intensity without signs/symptoms of physical distress.   Average METs 3.6   Resistance Training   Training Prescription Yes   Weight 2 lbs   Reps 10-12   Interval Training   Interval Training No   Bike   Level 0.9    Minutes 10   METs 3.67   NuStep   Level 4   Minutes 10   METs 3.6   Track   Laps 15   Minutes 10   METs 3.6   Home Exercise Plan   Plans to continue exercise at Home  walking and treadmill   Frequency Add 3 additional days to program exercise sessions.      Functional Capacity:     6 Minute Walk      05/03/15 1139       6 Minute Walk   Phase Initial     Distance 1673 feet     Walk Time 6 minutes     # of Rest Breaks 0     MPH 3.17     METS 3.55     RPE 11     VO2 Peak 12.42     Symptoms No     Resting HR 85 bpm     Resting BP 122/60 mmHg     Max Ex. HR 108 bpm     Max Ex. BP 134/62 mmHg        Psychological, QOL, Others - Outcomes: PHQ 2/9: Depression screen [facility] 05/03/2015 03/16/2015 01/23/2015 08/06/2014  Decreased  Interest 0 0 0 3  Down, Depressed, Hopeless 0 0 0 0  PHQ - 2 Score 0 0 0 3  Altered sleeping - - - 1  Tired, decreased energy - - - 3  Change in appetite - - - 0  Feeling bad or failure about yourself  - - - 0  Trouble concentrating - - - 0  Moving slowly or fidgety/restless - - - 3  Suicidal thoughts - - - 0  PHQ-9 Score - - - 10  Difficult doing work/chores - - - Not difficult at all    Quality of Life:     Quality of Life - 05/09/15 1227    Quality of Life Scores   Health/Function Pre --  Hoyle Sauer reported that she has experienced some shortness of breath but it is getting better      Personal Goals: Goals established at orientation with interventions provided to work toward goal.     Personal Goals and Risk Factors at Admission - 04/23/15 1458    Core Components/Risk Factors/Patient Goals on Admission   Expected Outcomes Short Term: Continue to assess and modify interventions until short term weight is achieved.;Long Term: Adherence to nutrition and physical activity/exercise program aimed toward attainment of established weight goal.   Sedentary Yes  since surgery   Intervention Provide advice, education, support and  counseling about physical activity/exercise needs.;Develop an individualized exercise prescription for aerobic and resistive training based on initial evaluation findings, risk stratification, comorbidities and participant's personal goals.   Expected Outcomes Achievement of increased cardiorespiratory fitness and enhanced flexibility, muscular endurance and strength shown through measurements of functional capaciy and personal statement of participant.   Tobacco Cessation Yes  previous smoker, quit 15 years ago   Expected Outcomes Long Term: Complete abstinence from all tobacco products for at least 12 months from quit date.   Hypertension Yes   Intervention Provide education on lifestyle modifcations including regular physical activity/exercise, weight management, moderate sodium restriction and increased consumption of fresh fruit, vegetables, and low fat dairy, alcohol moderation, and smoking cessation.;Monitor prescription use compliance.   Expected Outcomes Short Term: Continued assessment and intervention until BP is < 140/20mm HG in hypertensive participants. < 130/73mm HG in hypertensive participants with diabetes, heart failure or chronic kidney disease.   Lipids Yes   Intervention Provide education and support for participant on nutrition & aerobic/resistive exercise along with prescribed medications to achieve LDL 70mg , HDL >40mg .   Expected Outcomes Short Term: Participant states understanding of desired cholesterol values and is compliant with medications prescribed. Participant is following exercise prescription and nutrition guidelines.;Long Term: Cholesterol controlled with medications as prescribed, with individualized exercise RX and with personalized nutrition plan. Value goals: LDL < 70mg , HDL > 40 mg.   Stress Yes   Personal Goal Other Yes   Personal Goal Increase leg strength   Intervention Design exercise prescription to increase leg strength   Expected Outcomes Able to  improve walk test and leg strength       Personal Goals Discharge:   Nutrition & Weight - Outcomes:     Pre Biometrics - 04/25/15 1133    Pre Biometrics   Height 5\' 5"  (1.651 m)   Weight 141 lb 8.6 oz (64.2 kg)   Waist Circumference 29 inches   Hip Circumference 36.5 inches   Waist to Hip Ratio 0.79 %   BMI (Calculated) 23.6   Triceps Skinfold 23 mm   % Body Fat 34 %   Grip Strength 35 kg  Flexibility 14 in       Nutrition:     Nutrition Therapy & Goals - 04/24/15 0935    Nutrition Therapy   Diet Therapeutic Lifestyle Changes   Personal Nutrition Goals   Personal Goal #1 6-10 lb wt loss from current wt of 142.6 lb at graduation from [place] Goal #2 Establish healthy eating patterns, emphasizing a variety of nutrient-dense foods in appropriate portion sizes, to improve overall health and specifically to attain blood glucose control through diet.   Intervention Plan   Intervention Prescribe, educate and counsel regarding individualized specific dietary modifications aiming towards targeted core components such as weight, hypertension, lipid management, diabetes, heart failure and other comorbidities.   Expected Outcomes Short Term Goal: Understand basic principles of dietary content, such as calories, fat, sodium, cholesterol and nutrients.;Long Term Goal: Adherence to prescribed nutrition plan.      Nutrition Discharge:     Nutrition Assessments - 06/03/15 1033    MEDFICTS Scores   Pre Score 29      Education Questionnaire Score:     Knowledge Questionnaire Score - 04/25/15 1133    Knowledge Questionnaire Score   Pre Score 21/24      We Will be glad to bring Ms Pessolano back to complete the program when she id ready. Barnet Pall, RN,BSN 08/02/2015 9:14 AM

## 2015-08-12 NOTE — Progress Notes (Signed)
Discharge Summary  Patient Details  Name: [name] MRN: [id number] Date of Birth: 1940/07/20 Referring Provider:        CARDIAC REHAB PHASE II EXERCISE from 05/20/2015 in [place]   Referring Provider  Croitoru, Mihai MD       Number of Visits: 6  Reason for Discharge:  Early Exit:  non attendance  Smoking History:  History  Smoking status  . Former Smoker -- 0.75 packs/day for 10 years  . Types: Cigarettes  Smokeless tobacco  . Never Used    Comment: 01/21/2012 "quit smoking ~ 2002"    Diagnosis:  No diagnosis found.  ADL UCSD:   Initial Exercise Prescription:     Initial Exercise Prescription - 05/23/15 1400    Date of Initial Exercise RX and Referring Provider   Date 05/03/15   Referring Provider Croitoru, Mihai MD   Bike   Level 0.9   Minutes 10   METs 3.68   NuStep   Level 4   Minutes 10   METs 3   Track   Laps 16   Minutes 10   METs 3.6   Resistance Training   Training Prescription Yes   Weight 2 lbs   Reps 10-12      Discharge Exercise Prescription (Final Exercise Prescription Changes):     Exercise Prescription Changes - 06/11/15 0800    Exercise Review   Progression Yes   Response to Exercise   Blood Pressure (Admit) 118/70 mmHg   Blood Pressure (Exercise) 140/76 mmHg   Blood Pressure (Exit) 120/60 mmHg   Heart Rate (Admit) 86 bpm   Heart Rate (Exercise) 112 bpm   Heart Rate (Exit) 76 bpm   Rating of Perceived Exertion (Exercise) 13   Comments Home Exercise Given 05/10/15   Duration Progress to 30 minutes of continuous aerobic without signs/symptoms of physical distress   Intensity THRR unchanged   Progression   Progression Continue to progress workloads to maintain intensity without signs/symptoms of physical distress.   Average METs 3.6   Resistance Training   Training Prescription Yes   Weight 2 lbs   Reps 10-12   Interval Training   Interval Training No   Bike   Level 0.9   Minutes  10   METs 3.67   NuStep   Level 4   Minutes 10   METs 3.6   Track   Laps 15   Minutes 10   METs 3.6   Home Exercise Plan   Plans to continue exercise at Home  walking and treadmill   Frequency Add 3 additional days to program exercise sessions.      Functional Capacity:   Psychological, QOL, Others - Outcomes: PHQ 2/9: Depression screen [facility] 2/9 05/03/2015 03/16/2015 01/23/2015 08/06/2014  Decreased Interest 0 0 0 3  Down, Depressed, Hopeless 0 0 0 0  PHQ - 2 Score 0 0 0 3  Altered sleeping - - - 1  Tired, decreased energy - - - 3  Change in appetite - - - 0  Feeling bad or failure about yourself  - - - 0  Trouble concentrating - - - 0  Moving slowly or fidgety/restless - - - 3  Suicidal thoughts - - - 0  PHQ-9 Score - - - 10  Difficult doing work/chores - - - Not difficult at all    Quality of Life:     Quality of Life - 05/09/15 1227    Quality of Life Scores  Health/Function Pre --  Hoyle Sauer reported that she has experienced some shortness of breath but it is getting better      Personal Goals: Goals established at orientation with interventions provided to work toward goal.    Personal Goals Discharge:   Nutrition & Weight - Outcomes:    Nutrition:   Nutrition Discharge:     Nutrition Assessments - 06/03/15 1033    MEDFICTS Scores   Pre Score 29      Education Questionnaire Score:   Patient discharged for nonattendance. Barnet Pall, RN,BSN 08/12/2015 5:56 PM

## 2015-08-23 ENCOUNTER — Ambulatory Visit (INDEPENDENT_AMBULATORY_CARE_PROVIDER_SITE_OTHER): Payer: Commercial Managed Care - HMO | Admitting: Cardiovascular Disease

## 2015-08-23 ENCOUNTER — Encounter: Payer: Self-pay | Admitting: Cardiovascular Disease

## 2015-08-23 ENCOUNTER — Telehealth: Payer: Self-pay | Admitting: Cardiovascular Disease

## 2015-08-23 ENCOUNTER — Ambulatory Visit (INDEPENDENT_AMBULATORY_CARE_PROVIDER_SITE_OTHER): Payer: Commercial Managed Care - HMO | Admitting: *Deleted

## 2015-08-23 VITALS — BP 170/102 | HR 113 | Ht 64.5 in | Wt 140.8 lb

## 2015-08-23 DIAGNOSIS — I480 Paroxysmal atrial fibrillation: Secondary | ICD-10-CM | POA: Diagnosis not present

## 2015-08-23 DIAGNOSIS — I10 Essential (primary) hypertension: Secondary | ICD-10-CM

## 2015-08-23 DIAGNOSIS — E785 Hyperlipidemia, unspecified: Secondary | ICD-10-CM

## 2015-08-23 DIAGNOSIS — I25118 Atherosclerotic heart disease of native coronary artery with other forms of angina pectoris: Secondary | ICD-10-CM

## 2015-08-23 DIAGNOSIS — I483 Typical atrial flutter: Secondary | ICD-10-CM | POA: Diagnosis not present

## 2015-08-23 DIAGNOSIS — I4892 Unspecified atrial flutter: Secondary | ICD-10-CM | POA: Insufficient documentation

## 2015-08-23 NOTE — Telephone Encounter (Signed)
Spoke with patient. She was experiencing a HA, a little chest pain on the left side, ankles swollen.  Checked BP last night - 168/110 Had BP checked @ fire department today 190/120 and then 190/114  Verified meds with patient - Imdur was d/c'ed 6/7 after OV 6/5 when Dr. Loletha Grayer OK'ed discontinuing med if she was not having angina. Patient has since stopped this medication   She would like advice on what to do regarding her elevated BP.   Will speak with provider and call her back

## 2015-08-23 NOTE — Progress Notes (Signed)
Patient ID: [name], female   DOB: December 20, 1940, 75 y.o.   MRN: [id number]     PCP: [name], MD Cardiologist: Sanda Klein, MD   HPI: [name] is a 75 y.o. female who presents to the office today and  Is seen as an add-on for evaluation of significant blood pressure elevation and increased heart rate.  [name] has a history of CAD and in November 2016 underwent CABG revascularization surgery with a LIMA to the LAD, SVG to the PDA, SVG to the intermediate branch, and at that time had a 45m atrial clip inserted.  She has significant peripheral vascular disease and previously had received stents to the SFA.  She has a history of hypertension and hyperlipidemia.  In December 2016 she developed atrial fibrillation and underwent cardioversion.  [facility] and ruled in for non-ST segment elevation MI.  She underwent urgent cardiac catheterization on April 1 17 by me and was found to have a patent LIMA to the LAD, an occluded vein graft to the ramus vessel, high-grade proximal native CAD with total distal native RCA occlusion, and high-grade stenoses in the proximal mid segment of the vein graft which supplied the PDA.  The distal RCA was not supplied by the graft.  Due to the distal occlusion.  She underwent successful DES stenting with Synergy stents, both to the proximal and midportion of the vein graft to the RCA and also to the proximal native RCA.  Subsequently, she had done well but had developed breathlessness with Brilinta and was switched to Plavix.  She was hospitalized in May with fever, cough and dyspnea with hypoxia was diagnosed with bronchitis, treated with antibiotics.  An echo Doppler study revealed an ejection fraction at 55-60% without regional wall motion abnormalities.  There was moderate AR.  She has a remote history of pacemaker insertion as a Medtronic Adapta dual-chamber device implanted in 2013.  She had been on  warfarin for paroxysmal atrial fibrillation but this was discontinued by Dr. CSallyanne Kusterlight of her atrial appendage clip and very low burden of arrhythmia.  Today, she did not feel as well noted  that her blood pressure was elevated.  She also had felt some vague left-sided discomfort.  She went to the fire department and was told that her blood pressure was 190/106.  She called the office and I recommended that she have a recheck of her blood pressure and pulse rate. Upon arrival to the office for recheck, her pulse was tachycardic.  An ECG revealed atrial flutter and she is now seen as an add-on for evaluation.  As only.  She denies any fever, previous typical anginal type symptoms.  She admits to feeling weak but denies any significant increasing shortness of breath at rest.    Past Medical History  Diagnosis Date  . GLUCOSE INTOLERANCE 12/31/2009  . HYPERLIPIDEMIA 12/31/2009  . ANXIETY 12/31/2009  . DEPRESSION 12/31/2009  . MITRAL REGURGITATION 12/31/2009  . HYPERTENSION 12/31/2009  . CORONARY ARTERY DISEASE 12/31/2009  . MITRAL VALVE PROLAPSE 12/31/2009  . MENOPAUSE, EARLY 12/31/2009  . Unspecified urinary incontinence 12/31/2009  . ABDOMINAL PAIN, LOWER 12/31/2009  . DIVERTICULITIS, HX OF 12/31/2009  . Pacemaker 01/21/2012    MDT Adapta dual chamber  . Tachycardia-bradycardia syndrome ([facility]     /[name]12/01/2012  . PAD (peripheral artery disease) ([place]   . Heart murmur     "used to say I did" (01/21/2012)  . OSTEOARTHRITIS, HAND 12/31/2009  .  [place] DISEASE, CERVICAL 12/31/2009  . [place] DISEASE, LUMBAR 12/31/2009  . Symptomatic sinus bradycardia 01/22/2012  . PAF (paroxysmal atrial fibrillation) ([place]) 01/22/2012  . Sinus node dysfunction ([place]) 01/22/2012  . PVD (peripheral vascular disease), hx stents to bil SFAs 02/2010 01/22/2012  . PAD (peripheral artery disease) ([place])   . Atrial fibrillation with RVR ([place])   . NSTEMI (non-ST elevated myocardial infarction) ([place]) 05/11/2015      Past Surgical History  Procedure Laterality Date  . Cardiac catheterization  2005    "negative" (01/21/2012)  . Oophorectomy  ~1979  . Partial colectomy  2010  . Carpal tunnel release  2008    "right hand/thumb; carpal tunnel repair; got rid of arthritis" (01/21/2012)  . Insert / replace / remove pacemaker  01/21/2012    initial placement  . Vaginal hysterectomy  1975  . Posterior cervical laminectomy  1985  . Peripheral arterial stent graft  2012; 2012    "LLE; RLE" (01/21/2012)  . Facelift, lower 2/3  1995    "mini" (01/21/2012)  . Augmentation mammaplasty  1983  . Pacemaker insertion  01/21/2012    Medtronic Adapta, model# ADDRL1, serial# [id number]  . Lower arterial examination  10/28/2011    R. SFA stent mild-moderate mixed density plaque with elevated velocities consistent with 50% diameter reduction. L. SFA stent moderate mixed denisty plaque at mid to distal level consistent with 50-69% diameter reduction.  . Cardiac catheterization  09/17/1999    Mild CAD involving proximal portion of LAD. Mitral valve prolapse w/ mitral regurg. No evidence of renal artery stenosis. Recommended readjustment of antihypertensive medications.  . Persantine myoview stress test  02/25/2010    No scintigraphic evidence of inducible myocardial ischemia. No persantine EKG changes. Non-diagnositc for ishcemia. Low-risk, normal scan.  Marland Kitchen Permanent pacemaker insertion N/A 01/21/2012    Procedure: PERMANENT PACEMAKER INSERTION;  Surgeon: Sanda Klein, MD;  Location: [place] CATH LAB;  Service: Cardiovascular;  Laterality: N/A;  . Cardiac catheterization N/A 01/01/2015    Procedure: Left Heart Cath and Coronary Angiography;  Surgeon: Jettie Booze, MD;  Location: [place] CV LAB;  Service: Cardiovascular;  Laterality: N/A;  . Coronary artery bypass graft N/A 01/04/2015    Procedure: CORONARY ARTERY BYPASS GRAFTING (CABG) x 3 using left internal mammory artery and greater saphenous vein right leg  harvested endoscopically.;  Surgeon: Grace Isaac, MD;  LIMA-LAD, SVG-RI, SVG-PDA  . Tee without cardioversion N/A 01/04/2015    Procedure: TRANSESOPHAGEAL ECHOCARDIOGRAM (TEE);  Surgeon: Grace Isaac, MD;  Location: [place];  Service: Open Heart Surgery;  Laterality: N/A;  . Clipping of atrial appendage N/A 01/04/2015    Procedure: CLIPPING OF ATRIAL APPENDAGE;  Surgeon: Grace Isaac, MD;  Location: [place];  Service: Open Heart Surgery;  Laterality: N/A;  . Cardioversion N/A 02/01/2015    Procedure: CARDIOVERSION;  Surgeon: Lelon Perla, MD;  Location: [facility] ENDOSCOPY;  Service: Cardiovascular;  Laterality: N/A;  . Tee without cardioversion N/A 02/01/2015    Procedure: TRANSESOPHAGEAL ECHOCARDIOGRAM (TEE);  Surgeon: Lelon Perla, MD;  Location: [facility] ENDOSCOPY;  Service: Cardiovascular;  Laterality: N/A;  . Cardiac surgery      11/20/20116  . Cardiac catheterization N/A 05/12/2015    Procedure: Left Heart Cath and Coronary Angiography;  Surgeon: Troy Sine, MD;  Location: [place] CV LAB;  Service: Cardiovascular;  Laterality: N/A;  . Cardiac catheterization N/A 05/12/2015    Procedure: Coronary Stent Intervention;  Surgeon: Troy Sine, MD;  Location: [place] CV LAB;  Service: Cardiovascular;  Laterality: N/A;    Allergies  Allergen Reactions  . Brilinta [Ticagrelor] Shortness Of Breath  . Clonidine Derivatives Other (See Comments)    Lowers heart rate  . Atorvastatin     Possible cause of fatigue/malaise  . Exforge [Amlodipine Besylate-Valsartan] Itching and Rash    Current Outpatient Prescriptions  Medication Sig Dispense Refill  . aspirin 81 MG EC tablet Take 81 mg by mouth daily.     . cholecalciferol (VITAMIN D) 1000 UNITS tablet Take 1,000 Units by mouth daily.    . citalopram (CELEXA) 20 MG tablet Take 1 tablet (20 mg total) by mouth daily. 90 tablet 0  . clopidogrel (PLAVIX) 75 MG tablet Take 1 tablet (75 mg total) by mouth daily. 90 tablet 1  .  cyanocobalamin 500 MCG tablet Take 1 tablet (500 mcg total) by mouth daily. (Patient taking differently: Take 1,000 mcg by mouth daily. ) 90 tablet 3  . lisinopril (PRINIVIL,ZESTRIL) 10 MG tablet Take 1 tablet (10 mg total) by mouth daily. 30 tablet 5  . metoprolol tartrate (LOPRESSOR) 25 MG tablet Take 1 tablet (25 mg total) by mouth 2 (two) times daily. 180 tablet 1  . nitroGLYCERIN (NITROSTAT) 0.4 MG SL tablet Place 1 tablet (0.4 mg total) under the tongue every 5 (five) minutes x 3 doses as needed for chest pain. 25 tablet 2  . rosuvastatin (CRESTOR) 10 MG tablet Take 1 tablet (10 mg total) by mouth daily. 90 tablet 3  . vitamin E 400 UNIT capsule Take 400 Units by mouth daily.     No current facility-administered medications for this visit.    Social History   Social History  . Marital Status: Widowed    Spouse Name: N/A  . Number of Children: 3  . Years of Education: N/A   Occupational History  . retired Chemical engineer    Social History Main Topics  . Smoking status: Former Smoker -- 0.75 packs/day for 10 years    Types: Cigarettes  . Smokeless tobacco: Never Used     Comment: 01/21/2012 "quit smoking ~ 2002"  . Alcohol Use: Yes     Comment: 01/21/2012 "3 times/wk I have a couple mixed drinks"; 06/28/2015 "nothing in the last 3 weeks cause I haven't felt good"  . Drug Use: No  . Sexual Activity: Not Currently   Other Topics Concern  . Not on file   Social History Narrative    Family History  Problem Relation Age of Onset  . Heart disease Brother     3 brothers with heart disease (2 also with CVA)  . Hypertension Brother   . Heart disease Mother   . Hypertension Mother   . Stroke Mother   . Stroke Father   . Hypertension Brother   . Stroke Brother     ROS General: Negative; No fevers, chills, or night sweats HEENT: Negative; No changes in vision or hearing, sinus congestion, difficulty swallowing Pulmonary: Negative; No cough, wheezing, shortness of breath,  hemoptysis Cardiovascular: See HPI GI: Negative; No nausea, vomiting, diarrhea, or abdominal pain GU: Negative; No dysuria, hematuria, or difficulty voiding Musculoskeletal: Negative; no myalgias, joint pain, or weakness Hematologic: Negative; no easy bruising, bleeding Endocrine: Negative; no heat/cold intolerance; no diabetes, Neuro: Negative; no changes in balance, headaches Skin: Negative; No rashes or skin lesions Psychiatric: Negative; No behavioral problems, depression Sleep: Negative; No snoring,  daytime sleepiness, hypersomnolence, bruxism, restless legs, hypnogognic hallucinations. Other comprehensive 14 point system review is negative   Physical  Exam BP 170/102 mmHg  Pulse 113  Ht 5' 4.5" (1.638 m)  Wt 140 lb 12.8 oz (63.866 kg)  BMI 23.80 kg/m2   Repeat blood pressure by me was 180/100.  Wt Readings from Last 3 Encounters:  08/23/15 140 lb 12.8 oz (63.866 kg)  07/15/15 132 lb 9.6 oz (60.147 kg)  06/29/15 139 lb 3.2 oz (63.141 kg)   General: Alert, oriented, no distress.  Skin: normal turgor, no rashes, warm and dry HEENT: Normocephalic, atraumatic. Pupils equal round and reactive to light; sclera anicteric; extraocular muscles intact, No lid lag; Nose without nasal septal hypertrophy; Mouth/Parynx benign; Mallinpatti scale 3 Neck: No JVD, no carotid bruits; normal carotid upstroke Lungs: clear to ausculatation and percussion bilaterally; no wheezing or rales, normal inspiratory and expiratory effort Chest wall: without tenderness to palpitation Heart: Tachycardic at approximately 115-120 bpm not displaced, s1 s2 normal, 1/6systolic murmur, No diastolic murmur, no rubs, gallops, thrills, or heaves Abdomen: soft, nontender; no hepatosplenomehaly, BS+; abdominal aorta nontender and not dilated by palpation. Back: no CVA tenderness Pulses: 2+  Musculoskeletal: full range of motion, normal strength, no joint deformities Extremities: Pulses 2+, no clubbing cyanosis or  edema, Homan's sign negative  Neurologic: grossly nonfocal; Cranial nerves grossly wnl Psychologic: Normal mood and affect   ECG (independently read by me): Atrial flutter with 2:1 block with a ventricular rate at 114 bpm.  Nonspecific ST-T wave changes.  LABS:  BMP Latest Ref Rng 06/29/2015 06/27/2015 06/26/2015  Glucose 65 - 99 mg/dL 98 131(H) 104(H)  BUN 6 - 20 mg/dL _0 Creatinine 0.44 - 1.00 mg/dL 0.89 0.95 0.81  Sodium 135 - 145 mmol/L 135 134(L) 134(L)  Potassium 3.5 - 5.1 mmol/L 4.3 4.3 4.0  Chloride 101 - 111 mmol/L 100(L) 103 99(L)  CO2 22 - 32 mmol/L _1 Calcium 8.9 - 10.3 mg/dL 9.8 10.0 10.2     Hepatic Function Latest Ref Rng 06/29/2015 06/27/2015 06/26/2015  Total Protein 6.5 - 8.1 g/dL 5.7(L) 6.0(L) 6.2(L)  Albumin 3.5 - 5.0 g/dL 2.5(L) 2.6(L) 2.8(L)  AST 15 - 41 U/L 43(H) 25 21  ALT 14 - 54 U/L 41 22 14  Alk Phosphatase 38 - 126 U/L 195(H) 169(H) 118  Total Bilirubin 0.3 - 1.2 mg/dL 0.2(L) 0.4 0.6  Bilirubin, Direct 0.1 - 0.5 mg/dL - - 0.3    CBC Latest Ref Rng 06/29/2015 06/27/2015 06/26/2015  WBC 4.0 - 10.5 K/uL 6.6 6.4 6.6  Hemoglobin 12.0 - 15.0 g/dL 11.0(L) 11.1(L) 12.0  Hematocrit 36.0 - 46.0 % 34.0(L) 33.5(L) 35.1(L)  Platelets 150 - 400 K/uL 195 199 189   Lab Results  Component Value Date   MCV 85.2 06/29/2015   MCV 84.6 06/27/2015   MCV 85.4 06/26/2015    Lab Results  Component Value Date   TSH 5.390* 05/12/2015    BNP    Component Value Date/Time   BNP 427.7* 06/26/2015 1105    ProBNP No results found for: PROBNP   Lipid Panel     Component Value Date/Time   CHOL 158 05/12/2015 0324   TRIG 114 05/12/2015 0324   HDL 64 05/12/2015 0324   CHOLHDL 2.5 05/12/2015 0324   VLDL 23 05/12/2015 0324   LDLCALC 71 05/12/2015 0324     RADIOLOGY: No results found.    ASSESSMENT AND PLAN: [name] is a 75 year old female who is status post CABG revascularization surgery in November 2016 and 5 months later was found to  have total occlusion  of the vein graft supplying the intermediate vessel and high-grade graft stenosis involving the vein graft supplying the RCA.  She ruled in for non-ST segment MI and underwent successful stenting to the proximal native RCA, and vein graft to the RCA.  She was treated medically for her intermediate vein graft occlusion.  She has a history of PAF, status post cardioversion, but had been maintaining sinus rhythm.  She had recently been taken off Coumadin anticoagulation.  Her pacemaker was recently interrogated by Dr. Sallyanne Kuster, which showed normal device function.  Presently, she has accelerated hypertension with reported blood pressure greater than 817 systolically when initially evaluated at the fire department earlier this afternoon.  Her blood pressure today is compatible with stage II hypertension and she is in atrial flutter with 2:1 with ventricular rate at approximately 115 bpm.  She had taken her metoprolol 25 mg earlier this morning.  She lives 5 minutes away from our office.  I have recommended that soon as she leaves the office and arrives home that she take 50 mg of metoprolol tartrate, and aproximaltey 6 hours later prior to going to bed take  additional 25 mg tonight.  Starting tomorrow, she will increase her daily dose to 50 mg twice a day.  She has also been on lisinopril 10 mg and with her significant blood pressure elevation she will increase her lisinopril to 20 mg daily starting tomorrow morning.  With her development of atrial flutter.  I have recommended reinitiation of anticoagulation and at least short-term have short-term provided her with samples of Xarelto initiated 20 mg daily.  She is only 3 months status post DES stenting to her RCA and SVG and will continue Plavix and aspirin 81 mg.  She will contact us over the weekend if her heart rate does not slow down.  The patient's grandson tomorrow is undergoing a "white coat" ceremony since he will be starting medical school  at [employer] and she is hopeful to attend this.  If she continues to have problems, she will contact us over the weekend, but I have recommended that she be added on to Dr. Sallyanne Kuster schedule on Monday morning for follow-up evaluation.  Due to the hour, laboratory is closed.  I will defer to Dr. Sallyanne Kuster to obtain blood work on Monday morning following his evaluation.  Time spent: 40 minutes  Troy Sine, MD, [facility]  08/23/2015 6:42 PM

## 2015-08-23 NOTE — Telephone Encounter (Signed)
Ylana said this is number she will be at when you call back.

## 2015-08-23 NOTE — Telephone Encounter (Signed)
New message       Pt c/o BP issue: STAT if pt c/o blurred vision, one-sided weakness or slurred speech  1. What are your last 5 BP readings?  190/120 and 190/114 at the fire dept 5 min ago  2. Are you having any other symptoms (ex. Dizziness, headache, blurred vision, passed out)?  Headache and dizziness  3. What is your BP issue? Pt felt bad and went to local fire dept to have her bp checked.  It is very high.  Please advise

## 2015-08-23 NOTE — Telephone Encounter (Signed)
Spoke with DOD - advised increasing lisinopril to 20mg  QD He would like to know what her HR is - she is sitting in our parking lot so added her on for nurse visit BP check  Patient aware to come up to our office to have VS checked to determine if B-blocker can be increased as well

## 2015-08-23 NOTE — Telephone Encounter (Signed)
Patient added on to MD schedule.

## 2015-08-23 NOTE — Patient Instructions (Signed)
Your physician has recommended you make the following change in your medication:   Take 50 mg of lopressor soon as you get home. Take another 25 mg at bedtime.  Starting tomorrow taking lopressor 50 mg twice a day.   Increase the lisinopril to 20 mg daily.  Start xarelto 20 mg samples given today once daily.  See Dr Sallyanne Kuster on Monday.

## 2015-08-26 ENCOUNTER — Encounter (HOSPITAL_COMMUNITY): Payer: Self-pay | Admitting: Emergency Medicine

## 2015-08-26 ENCOUNTER — Emergency Department (HOSPITAL_COMMUNITY): Payer: Commercial Managed Care - HMO

## 2015-08-26 ENCOUNTER — Observation Stay (HOSPITAL_COMMUNITY): Payer: Commercial Managed Care - HMO

## 2015-08-26 ENCOUNTER — Observation Stay (HOSPITAL_COMMUNITY)
Admission: EM | Admit: 2015-08-26 | Discharge: 2015-08-27 | Disposition: A | Discharge status: Home / Self Care | Payer: Commercial Managed Care - HMO | Attending: Internal Medicine | Admitting: Internal Medicine

## 2015-08-26 DIAGNOSIS — Z9882 Breast implant status: Secondary | ICD-10-CM | POA: Diagnosis not present

## 2015-08-26 DIAGNOSIS — I4892 Unspecified atrial flutter: Secondary | ICD-10-CM | POA: Diagnosis not present

## 2015-08-26 DIAGNOSIS — I739 Peripheral vascular disease, unspecified: Secondary | ICD-10-CM | POA: Diagnosis not present

## 2015-08-26 DIAGNOSIS — R079 Chest pain, unspecified: Secondary | ICD-10-CM | POA: Diagnosis not present

## 2015-08-26 DIAGNOSIS — I110 Hypertensive heart disease with heart failure: Secondary | ICD-10-CM | POA: Insufficient documentation

## 2015-08-26 DIAGNOSIS — Z951 Presence of aortocoronary bypass graft: Secondary | ICD-10-CM

## 2015-08-26 DIAGNOSIS — Z823 Family history of stroke: Secondary | ICD-10-CM | POA: Insufficient documentation

## 2015-08-26 DIAGNOSIS — Z950 Presence of cardiac pacemaker: Secondary | ICD-10-CM | POA: Diagnosis not present

## 2015-08-26 DIAGNOSIS — Z95820 Peripheral vascular angioplasty status with implants and grafts: Secondary | ICD-10-CM | POA: Insufficient documentation

## 2015-08-26 DIAGNOSIS — R0602 Shortness of breath: Secondary | ICD-10-CM | POA: Diagnosis not present

## 2015-08-26 DIAGNOSIS — Z7901 Long term (current) use of anticoagulants: Secondary | ICD-10-CM | POA: Diagnosis not present

## 2015-08-26 DIAGNOSIS — R0789 Other chest pain: Secondary | ICD-10-CM | POA: Diagnosis not present

## 2015-08-26 DIAGNOSIS — Z955 Presence of coronary angioplasty implant and graft: Secondary | ICD-10-CM | POA: Diagnosis not present

## 2015-08-26 DIAGNOSIS — I10 Essential (primary) hypertension: Secondary | ICD-10-CM

## 2015-08-26 DIAGNOSIS — I252 Old myocardial infarction: Secondary | ICD-10-CM | POA: Diagnosis not present

## 2015-08-26 DIAGNOSIS — Z7982 Long term (current) use of aspirin: Secondary | ICD-10-CM | POA: Insufficient documentation

## 2015-08-26 DIAGNOSIS — I2510 Atherosclerotic heart disease of native coronary artery without angina pectoris: Secondary | ICD-10-CM | POA: Insufficient documentation

## 2015-08-26 DIAGNOSIS — R0600 Dyspnea, unspecified: Secondary | ICD-10-CM

## 2015-08-26 DIAGNOSIS — E785 Hyperlipidemia, unspecified: Secondary | ICD-10-CM | POA: Diagnosis not present

## 2015-08-26 DIAGNOSIS — I480 Paroxysmal atrial fibrillation: Secondary | ICD-10-CM | POA: Insufficient documentation

## 2015-08-26 DIAGNOSIS — Z7902 Long term (current) use of antithrombotics/antiplatelets: Secondary | ICD-10-CM | POA: Insufficient documentation

## 2015-08-26 DIAGNOSIS — R51 Headache: Secondary | ICD-10-CM | POA: Insufficient documentation

## 2015-08-26 DIAGNOSIS — I5031 Acute diastolic (congestive) heart failure: Secondary | ICD-10-CM | POA: Insufficient documentation

## 2015-08-26 LAB — CBC WITH DIFFERENTIAL/PLATELET
Basophils Absolute: 0 10*3/uL (ref 0.0–0.1)
Basophils Absolute: 0 10*3/uL (ref 0.0–0.1)
Basophils Relative: 1 %
Basophils Relative: 0 %
Eosinophils Absolute: 0.3 10*3/uL (ref 0.0–0.7)
Eosinophils Absolute: 0.2 10*3/uL (ref 0.0–0.7)
Eosinophils Relative: 5 %
Eosinophils Relative: 5 %
HCT: 36 % (ref 36.0–46.0)
HCT: 33.3 % — ABNORMAL LOW (ref 36.0–46.0)
Hemoglobin: 11.3 g/dL — ABNORMAL LOW (ref 12.0–15.0)
Hemoglobin: 10.4 g/dL — ABNORMAL LOW (ref 12.0–15.0)
Lymphocytes Relative: 33 %
Lymphocytes Relative: 41 %
Lymphs Abs: 1.8 10*3/uL (ref 0.7–4.0)
Lymphs Abs: 2 10*3/uL (ref 0.7–4.0)
MCH: 27.9 pg (ref 26.0–34.0)
MCH: 27.7 pg (ref 26.0–34.0)
MCHC: 31.4 g/dL (ref 30.0–36.0)
MCHC: 31.2 g/dL (ref 30.0–36.0)
MCV: 88.9 fL (ref 78.0–100.0)
MCV: 88.8 fL (ref 78.0–100.0)
Monocytes Absolute: 0.8 10*3/uL (ref 0.1–1.0)
Monocytes Absolute: 0.5 10*3/uL (ref 0.1–1.0)
Monocytes Relative: 14 %
Monocytes Relative: 10 %
Neutro Abs: 2.6 10*3/uL (ref 1.7–7.7)
Neutro Abs: 2.1 10*3/uL (ref 1.7–7.7)
Neutrophils Relative %: 47 %
Neutrophils Relative %: 44 %
Platelets: 232 10*3/uL (ref 150–400)
Platelets: 194 10*3/uL (ref 150–400)
RBC: 4.05 MIL/uL (ref 3.87–5.11)
RBC: 3.75 MIL/uL — ABNORMAL LOW (ref 3.87–5.11)
RDW: 14.3 % (ref 11.5–15.5)
RDW: 14.4 % (ref 11.5–15.5)
WBC: 5.5 10*3/uL (ref 4.0–10.5)
WBC: 4.7 10*3/uL (ref 4.0–10.5)

## 2015-08-26 LAB — COMPREHENSIVE METABOLIC PANEL
ALT: 21 U/L (ref 14–54)
AST: 29 U/L (ref 15–41)
Albumin: 3.3 g/dL — ABNORMAL LOW (ref 3.5–5.0)
Alkaline Phosphatase: 147 U/L — ABNORMAL HIGH (ref 38–126)
Anion gap: 3 — ABNORMAL LOW (ref 5–15)
BUN: 16 mg/dL (ref 6–20)
CO2: 26 mmol/L (ref 22–32)
Calcium: 10.8 mg/dL — ABNORMAL HIGH (ref 8.9–10.3)
Chloride: 106 mmol/L (ref 101–111)
Creatinine, Ser: 0.86 mg/dL (ref 0.44–1.00)
GFR calc Af Amer: >60 mL/min (ref >60)
GFR calc non Af Amer: >60 mL/min (ref >60)
Glucose, Bld: 106 mg/dL — ABNORMAL HIGH (ref 65–99)
Potassium: 4.7 mmol/L (ref 3.5–5.1)
Sodium: 135 mmol/L (ref 135–145)
Total Bilirubin: 0.9 mg/dL (ref 0.3–1.2)
Total Protein: 6.4 g/dL — ABNORMAL LOW (ref 6.5–8.1)

## 2015-08-26 LAB — URINALYSIS, ROUTINE W REFLEX MICROSCOPIC
Bilirubin Urine: NEGATIVE
Glucose, UA: NEGATIVE mg/dL
Hgb urine dipstick: NEGATIVE
Ketones, ur: NEGATIVE mg/dL
Nitrite: NEGATIVE
Protein, ur: 30 mg/dL — AB
Specific Gravity, Urine: 1.021 (ref 1.005–1.030)
pH: 5.5 (ref 5.0–8.0)

## 2015-08-26 LAB — BRAIN NATRIURETIC PEPTIDE: B Natriuretic Peptide: 647.2 pg/mL — ABNORMAL HIGH (ref 0.0–100.0)

## 2015-08-26 LAB — URINE MICROSCOPIC-ADD ON

## 2015-08-26 LAB — TROPONIN I
Troponin I: 0.03 ng/mL (ref <0.03)
Troponin I: 0.03 ng/mL (ref <0.03)
Troponin I: <0.03 ng/mL (ref <0.03)

## 2015-08-26 LAB — PROTIME-INR
INR: 3.95 — ABNORMAL HIGH (ref 0.00–1.49)
Prothrombin Time: 37.7 seconds — ABNORMAL HIGH (ref 11.6–15.2)

## 2015-08-26 LAB — I-STAT CHEM 8, ED
BUN: 18 mg/dL (ref 6–20)
Calcium, Ion: 1.46 mmol/L — ABNORMAL HIGH (ref 1.12–1.23)
Chloride: 102 mmol/L (ref 101–111)
Creatinine, Ser: 0.9 mg/dL (ref 0.44–1.00)
Glucose, Bld: 121 mg/dL — ABNORMAL HIGH (ref 65–99)
HCT: 36 % (ref 36.0–46.0)
Hemoglobin: 12.2 g/dL (ref 12.0–15.0)
Potassium: 4.9 mmol/L (ref 3.5–5.1)
Sodium: 139 mmol/L (ref 135–145)
TCO2: 26 mmol/L (ref 0–100)

## 2015-08-26 LAB — I-STAT TROPONIN, ED: Troponin i, poc: 0.02 ng/mL (ref 0.00–0.08)

## 2015-08-26 LAB — APTT: aPTT: 46 seconds — ABNORMAL HIGH (ref 24–37)

## 2015-08-26 LAB — MAGNESIUM: Magnesium: 2.1 mg/dL (ref 1.7–2.4)

## 2015-08-26 MED ORDER — VITAMIN E 180 MG (400 UNIT) PO CAPS
400.0000 [IU] | ORAL_CAPSULE | Freq: Every day | ORAL | Status: DC
  Administered 2015-08-26 – 2015-08-27 (×2): 400 [IU] via ORAL
  Filled 2015-08-26 (×2): qty 1

## 2015-08-26 MED ORDER — NITROGLYCERIN 0.4 MG SL SUBL: 0.4000 mg | SUBLINGUAL_TABLET | SUBLINGUAL | Status: DC | PRN

## 2015-08-26 MED ORDER — FUROSEMIDE 10 MG/ML IJ SOLN
20.0000 mg | Freq: Once | INTRAMUSCULAR | Status: AC
  Administered 2015-08-26: 20 mg via INTRAVENOUS
  Filled 2015-08-26: qty 2

## 2015-08-26 MED ORDER — MORPHINE SULFATE (PF) 2 MG/ML IV SOLN
2.0000 mg | INTRAVENOUS | Status: DC | PRN
  Administered 2015-08-26 (×3): 2 mg via INTRAVENOUS
  Filled 2015-08-26 (×3): qty 1

## 2015-08-26 MED ORDER — IOPAMIDOL (ISOVUE-370) INJECTION 76%
INTRAVENOUS | Status: AC
  Administered 2015-08-26: 100 mL
  Filled 2015-08-26: qty 100

## 2015-08-26 MED ORDER — ASPIRIN EC 81 MG PO TBEC
81.0000 mg | DELAYED_RELEASE_TABLET | Freq: Every day | ORAL | Status: DC
  Administered 2015-08-26 – 2015-08-27 (×2): 81 mg via ORAL
  Filled 2015-08-26 (×2): qty 1

## 2015-08-26 MED ORDER — LOSARTAN POTASSIUM 50 MG PO TABS
50.0000 mg | ORAL_TABLET | Freq: Every day | ORAL | Status: DC
  Administered 2015-08-27: 50 mg via ORAL
  Filled 2015-08-26: qty 1

## 2015-08-26 MED ORDER — ONDANSETRON HCL 4 MG/2ML IJ SOLN
4.0000 mg | Freq: Once | INTRAMUSCULAR | Status: AC
  Administered 2015-08-26: 4 mg via INTRAVENOUS
  Filled 2015-08-26: qty 2

## 2015-08-26 MED ORDER — METOPROLOL TARTRATE 50 MG PO TABS
50.0000 mg | ORAL_TABLET | Freq: Two times a day (BID) | ORAL | Status: DC
  Administered 2015-08-26 – 2015-08-27 (×3): 50 mg via ORAL
  Filled 2015-08-26 (×3): qty 1

## 2015-08-26 MED ORDER — ONDANSETRON HCL 4 MG/2ML IJ SOLN: 4.0000 mg | Freq: Four times a day (QID) | INTRAMUSCULAR | Status: DC | PRN

## 2015-08-26 MED ORDER — VITAMIN D 1000 UNITS PO TABS
1000.0000 [IU] | ORAL_TABLET | Freq: Every day | ORAL | Status: DC
  Administered 2015-08-26 – 2015-08-27 (×2): 1000 [IU] via ORAL
  Filled 2015-08-26 (×2): qty 1

## 2015-08-26 MED ORDER — CLOPIDOGREL BISULFATE 75 MG PO TABS
75.0000 mg | ORAL_TABLET | Freq: Every day | ORAL | Status: DC
  Administered 2015-08-26 – 2015-08-27 (×2): 75 mg via ORAL
  Filled 2015-08-26 (×2): qty 1

## 2015-08-26 MED ORDER — HEPARIN (PORCINE) IN NACL 100-0.45 UNIT/ML-% IJ SOLN
850.0000 [IU]/h | INTRAMUSCULAR | Status: DC
  Administered 2015-08-26: 850 [IU]/h via INTRAVENOUS
  Filled 2015-08-26: qty 250

## 2015-08-26 MED ORDER — CITALOPRAM HYDROBROMIDE 10 MG PO TABS
20.0000 mg | ORAL_TABLET | Freq: Every day | ORAL | Status: DC
  Administered 2015-08-26 – 2015-08-27 (×2): 20 mg via ORAL
  Filled 2015-08-26 (×2): qty 2

## 2015-08-26 MED ORDER — ACETAMINOPHEN 325 MG PO TABS
650.0000 mg | ORAL_TABLET | ORAL | Status: DC | PRN
  Administered 2015-08-26 – 2015-08-27 (×3): 650 mg via ORAL
  Filled 2015-08-26 (×4): qty 2

## 2015-08-26 MED ORDER — LISINOPRIL 10 MG PO TABS: 20.0000 mg | ORAL_TABLET | Freq: Every day | ORAL | Status: DC

## 2015-08-26 MED ORDER — ROSUVASTATIN CALCIUM 10 MG PO TABS
10.0000 mg | ORAL_TABLET | Freq: Every day | ORAL | Status: DC
  Administered 2015-08-26 – 2015-08-27 (×2): 10 mg via ORAL
  Filled 2015-08-26 (×2): qty 1

## 2015-08-26 MED ORDER — NITROGLYCERIN 0.4 MG SL SUBL
0.4000 mg | SUBLINGUAL_TABLET | SUBLINGUAL | Status: DC | PRN
  Administered 2015-08-26 (×2): 0.4 mg via SUBLINGUAL
  Filled 2015-08-26: qty 1

## 2015-08-26 NOTE — ED Notes (Signed)
Brought by EMS from home with c/o neck and head pain.  Had same pain on Thursday evening that went into the chest and the left arm.  Took 325mg  ASA X 2 at home.  Given Ntg SL X 1 enroute.  Pain reduced from 9 to 7/10.  Alert and answering questions in triage.

## 2015-08-26 NOTE — Consult Note (Signed)
CARDIOLOGY CONSULT NOTE   Patient ID: [name] MRN: [id number], DOB/AGE: 1940-06-16   Admit date: 08/26/2015 Date of Consult: 08/26/2015   Primary Physician: [name], MD Primary Cardiologist: Dr. Claiborne Billings / Dr. Sallyanne Kuster (device)  Pt. Profile  [name] is a 75 yo female with PMH of HTN, HLD, CAD s/p 3v CABG, PAD s/p stents to SFA, PAF s/p atrial clip and h/o symptomatic bradycardia s/p Medtronic dual chamber PPM 2013 presented with SOB for several weeks, intermittent chest pain for several days and also severe headache.   Problem List  Past Medical History  Diagnosis Date  . GLUCOSE INTOLERANCE 12/31/2009  . HYPERLIPIDEMIA 12/31/2009  . ANXIETY 12/31/2009  . DEPRESSION 12/31/2009  . MITRAL REGURGITATION 12/31/2009  . HYPERTENSION 12/31/2009  . CORONARY ARTERY DISEASE 12/31/2009  . MITRAL VALVE PROLAPSE 12/31/2009  . MENOPAUSE, EARLY 12/31/2009  . Unspecified urinary incontinence 12/31/2009  . ABDOMINAL PAIN, LOWER 12/31/2009  . DIVERTICULITIS, HX OF 12/31/2009  . Pacemaker 01/21/2012    MDT Adapta dual chamber  . Tachycardia-bradycardia syndrome ([place])     [name] 01/21/2012  . PAD (peripheral artery disease) ([place])   . Heart murmur     "used to say I did" (01/21/2012)  . OSTEOARTHRITIS, HAND 12/31/2009  . [place] DISEASE, CERVICAL 12/31/2009  . [place] DISEASE, LUMBAR 12/31/2009  . Symptomatic sinus bradycardia 01/22/2012  . PAF (paroxysmal atrial fibrillation) ([place]) 01/22/2012  . Sinus node dysfunction ([place]) 01/22/2012  . PVD (peripheral vascular disease), hx stents to bil SFAs 02/2010 01/22/2012  . PAD (peripheral artery disease) ([place])   . Atrial fibrillation with RVR ([place])   . NSTEMI (non-ST elevated myocardial infarction) ([place]) 05/11/2015    Past Surgical History  Procedure Laterality Date  . Cardiac catheterization  2005    "negative" (01/21/2012)  . Oophorectomy  ~1979  . Partial colectomy  2010  . Carpal tunnel release  2008    "right hand/thumb; carpal  tunnel repair; got rid of arthritis" (01/21/2012)  . Insert / replace / remove pacemaker  01/21/2012    initial placement  . Vaginal hysterectomy  1975  . Posterior cervical laminectomy  1985  . Peripheral arterial stent graft  2012; 2012    "LLE; RLE" (01/21/2012)  . Facelift, lower 2/3  1995    "mini" (01/21/2012)  . Augmentation mammaplasty  1983  . Pacemaker insertion  01/21/2012    Medtronic Adapta, model# ADDRL1, serial# [id number]  . Lower arterial examination  10/28/2011    R. SFA stent mild-moderate mixed density plaque with elevated velocities consistent with 50% diameter reduction. L. SFA stent moderate mixed denisty plaque at mid to distal level consistent with 50-69% diameter reduction.  . Cardiac catheterization  09/17/1999    Mild CAD involving proximal portion of LAD. Mitral valve prolapse w/ mitral regurg. No evidence of renal artery stenosis. Recommended readjustment of antihypertensive medications.  . Persantine myoview stress test  02/25/2010    No scintigraphic evidence of inducible myocardial ischemia. No persantine EKG changes. Non-diagnositc for ishcemia. Low-risk, normal scan.  Marland Kitchen Permanent pacemaker insertion N/A 01/21/2012    Procedure: PERMANENT PACEMAKER INSERTION;  Surgeon: Sanda Klein, MD;  Location: [facility];  Service: Cardiovascular;  Laterality: N/A;  . Cardiac catheterization N/A 01/01/2015    Procedure: Left Heart Cath and Coronary Angiography;  Surgeon: Jettie Booze, MD;  Location: [facility];  Service: Cardiovascular;  Laterality: N/A;  . Coronary artery bypass graft N/A 01/04/2015    Procedure: CORONARY ARTERY BYPASS GRAFTING (CABG) x  3 using left internal mammory artery and greater saphenous vein right leg harvested endoscopically.;  Surgeon: Grace Isaac, MD;  LIMA-LAD, SVG-RI, SVG-PDA  . Tee without cardioversion N/A 01/04/2015    Procedure: TRANSESOPHAGEAL ECHOCARDIOGRAM (TEE);  Surgeon: Grace Isaac, MD;  Location: [facility];   Service: Open Heart Surgery;  Laterality: N/A;  . Clipping of atrial appendage N/A 01/04/2015    Procedure: CLIPPING OF ATRIAL APPENDAGE;  Surgeon: Grace Isaac, MD;  Location: [facility];  Service: Open Heart Surgery;  Laterality: N/A;  . Cardioversion N/A 02/01/2015    Procedure: CARDIOVERSION;  Surgeon: Lelon Perla, MD;  Location: [facility] ENDOSCOPY;  Service: Cardiovascular;  Laterality: N/A;  . Tee without cardioversion N/A 02/01/2015    Procedure: TRANSESOPHAGEAL ECHOCARDIOGRAM (TEE);  Surgeon: Lelon Perla, MD;  Location: [facility] ENDOSCOPY;  Service: Cardiovascular;  Laterality: N/A;  . Cardiac surgery      11/20/20116  . Cardiac catheterization N/A 05/12/2015    Procedure: Left Heart Cath and Coronary Angiography;  Surgeon: Troy Sine, MD;  Location: [place] [facility] CV LAB;  Service: Cardiovascular;  Laterality: N/A;  . Cardiac catheterization N/A 05/12/2015    Procedure: Coronary Stent Intervention;  Surgeon: Troy Sine, MD;  Location: [facility] CV LAB;  Service: Cardiovascular;  Laterality: N/A;     Allergies  Allergies  Allergen Reactions  . Brilinta [Ticagrelor] Shortness Of Breath  . Clonidine Derivatives Other (See Comments)    Lowers heart rate  . Atorvastatin     Possible cause of fatigue/malaise  . Exforge [Amlodipine Besylate-Valsartan] Itching and Rash    HPI   [name] is a 75 yo female with PMH of HTN, HLD, CAD s/p 3v CABG, PAD s/p stents to SFA, PAF s/p atrial clip and h/o symptomatic bradycardia s/p Medtronic dual chamber PPM 2013. Patient initially underwent cardiac catheterization in November 2016 with LIMA to LAD, SVG to PDA, SVG to intermediate branch. She also had atrial clip at the same time. She had cardioversion in December 2016. She was last admitted in April 2017 for NSTEMI, unfortunately at that time, cardiac catheterization showed occluded vein graft to ramus vessel with 80% stenosis in prox native vessel which was treated medically, total distal  native RCA occlusion, high-grade stenosis in proximal mid segment of SVG to PDA. She underwent successful DES stenting to both proximal and mid SVG to RCA and to proximal native RCA. She did have some shortness of breath with Brilinta, this has been switched to Plavix. She was admitted in May with fever, cough and dyspnea with hypoxia and diagnosed with bronchitis which was treated with antibiotic. Last echocardiogram obtained on 07/11/2015 showed EF 0000000, grade 2 diastolic dysfunction, 40 mm ascending aortic diameter, peak PA pressure 39 mmHg. She used to be on Coumadin in the past, however this was discontinued after she saw Dr. Sallyanne Kuster who felt that her arrhythmia burden is low especially after atrial clip and the need for DAPT.  According to the patient, since her bronchitis in May, she continued to have shortness of breath with nonproductive cough. She denies any obvious fever or chill. She was last seen in the office by Dr. Claiborne Billings on 08/23/2015, at which time she was having chest discomfort lateral to the left breast radiating to the back. In contrast with her previous angina which was right-sided chest pain radiating into the back and also right upper arm, her current chest pain seems to be atypical. It does last less than 5 minutes each time and  multiple times throughout the day. She says she is very weak and has no energy. She also has had a very severe headache last Wednesday, Friday, and also last night. Initial onset of headache was prior to her visit with Dr. Claiborne Billings. On office visit, it was noted her blood pressure was very high, she was also back in new atrial flutter with office EKG. Her lisinopril was increased from 10 mg to 20 mg, her metoprolol was increased from 25 mg twice a day to 50 mg twice a day. She was started back on Xarelto given recurrence of atrial flutter along with ASA and plavix. Since her visit with Dr. Evette Georges office, she continued to have shortness of breath and chest discomfort.  She measured her blood pressure over the weekend, her systolic blood pressure remains in the 190s range. She says she has not felt any better after adjusting medication. She had severe episode of headache last night. Given persistent symptoms, she finally decided to seek medical attention at [facility] ED on 08/26/2015.  She was admitted to internal medicine service, initial BNP was 647.2. Initial troponin was mildly elevated at 0.03. Her alkaline phosphatase was 147. She denies any abdominal pain. Urinalysis showed a few bacteria. Chest x-ray showed haziness over the bases. EKG showed atrial flutter. Initially code STEMI was activated, however after EKG was reviewed by cardiology service, code STEMI was canceled.   Inpatient Medications  . aspirin EC  81 mg Oral Daily  . cholecalciferol  1,000 Units Oral Daily  . citalopram  20 mg Oral Daily  . clopidogrel  75 mg Oral Daily  . lisinopril  20 mg Oral Daily  . metoprolol  50 mg Oral BID  . rosuvastatin  10 mg Oral Daily  . vitamin E  400 Units Oral Daily    Family History Family History  Problem Relation Age of Onset  . Heart disease Brother     3 brothers with heart disease (2 also with CVA)  . Hypertension Brother   . Heart disease Mother   . Hypertension Mother   . Stroke Mother   . Stroke Father   . Hypertension Brother   . Stroke Brother      Social History Social History   Social History  . Marital Status: Widowed    Spouse Name: N/A  . Number of Children: 3  . Years of Education: N/A   Occupational History  . retired Chemical engineer    Social History Main Topics  . Smoking status: Former Smoker -- 0.75 packs/day for 10 years    Types: Cigarettes  . Smokeless tobacco: Never Used     Comment: 01/21/2012 "quit smoking ~ 2002"  . Alcohol Use: Yes     Comment: 01/21/2012 "3 times/wk I have a couple mixed drinks"; 06/28/2015 "nothing in the last 3 weeks cause I haven't felt good"  . Drug Use: No  . Sexual Activity:  Not Currently   Other Topics Concern  . Not on file   Social History Narrative     Review of Systems  General:  No chills, fever, night sweats or weight changes.  Cardiovascular:  No orthopnea, palpitations, paroxysmal nocturnal dyspnea. +chest pain, edema Dermatological: No rash, lesions/masses  Respiratory: +cough, dyspnea Urologic: No hematuria, dysuria Abdominal:   No nausea, vomiting, diarrhea, bright red blood per rectum, melena, or hematemesis Neurologic:  No visual changes, changes in mental status. +wkns All other systems reviewed and are otherwise negative except as noted above.  Physical Exam  Blood pressure 141/80, pulse 75, temperature 98 F (36.7 C), temperature source Oral, resp. rate 18, height 5' 4.5" (1.638 m), weight 132 lb (59.875 kg), SpO2 95 %.  General: Pleasant, NAD Psych: Normal affect. Neuro: Alert and oriented X 3. Moves all extremities spontaneously. HEENT: Normal  Neck: Supple without bruits or JVD. Lungs:  Resp regular and unlabored, CTA. Heart: irregular. no s3, s4, or murmurs. Abdomen: Soft, non-tender, non-distended, BS + x 4.  Extremities: No clubbing, cyanosis or edema. DP/PT/Radials 2+ and equal bilaterally.  Labs   Recent Labs  08/26/15 0837  TROPONINI 0.03*   Lab Results  Component Value Date   WBC 4.7 08/26/2015   HGB 10.4* 08/26/2015   HCT 33.3* 08/26/2015   MCV 88.8 08/26/2015   PLT 194 08/26/2015    Recent Labs Lab 08/26/15 0837  NA 135  K 4.7  CL 106  CO2 26  BUN 16  CREATININE 0.86  CALCIUM 10.8*  PROT 6.4*  BILITOT 0.9  ALKPHOS 147*  ALT 21  AST 29  GLUCOSE 106*   Lab Results  Component Value Date   CHOL 158 05/12/2015   HDL 64 05/12/2015   LDLCALC 71 05/12/2015   TRIG 114 05/12/2015   No results found for: DDIMER  Radiology/Studies  Dg Chest 2 View  08/26/2015  CLINICAL DATA:  Shortness of breath. EXAM: CHEST  2 VIEW COMPARISON:  Jun 27, 2015 FINDINGS: No pneumothorax. Haziness over the bases  is at least partially explained by overlapping soft tissues and implants. Mild pulmonary venous congestion is not excluded. No acute infiltrates are identified. Stable mild cardiomegaly. IMPRESSION: Haziness over the bases, at least partially explained by overlapping soft tissues and breast implants. Suspected mild pulmonary venous congestion. Electronically Signed   By: Dorise Bullion III M.D   On: 08/26/2015 04:55    ECG  Aflutter   ASSESSMENT AND PLAN  1. Atypical chest pain  - unclear if related to uncontrolled HTN, given persistent SOB for several weeks before initiation of Xarelto, will obtain CTA of chest  2. SOB: Obtain CTA of chest, she appears to be euvolemic on exam  - change lisinopril to losartan 50mg  daily. Does have h/o rash with amlodipine-valsartan, will monitor  3. Headache: per hospitalist  4. PAF/aflutter on Xarelto  - Xarelto held on admitted, continue rate control, will interrogate device to check for onset  5. CAD s/p 3v CABG LIMA to LAD, SVG to PDA, SVG to intermediate branch 12/2014  - April 2017 for NSTEMI, unfortunately at that time, cardiac catheterization showed occluded vein graft to ramus vessel with 80% stenosis in prox native vessel which was treated medically, total distal native RCA occlusion, high-grade stenosis in proximal mid segment of SVG to PDA. She underwent successful DES stenting to both proximal and mid SVG to RCA and to proximal native RCA.  6. h/o symptomatic bradycardia s/p Medtronic dual chamber PPM 2013  7. HTN: uncontrolled  8. HLD: on crestor  Hilbert Corrigan, PA-C 08/26/2015, 12:18 PM Patient seen and examined and history reviewed. Agree with above findings and plan. 75 yo WF with complex cardiac history. She presents with 10 day history of worsening SOB, weakness and fatigue. She complains of left sided chest pain lateral to left breast and radiating to back. Seen by Dr. Claiborne Billings on Friday and found to be in Atrial flutter with 2:1  conduction. She was hypertensive. She was started on Xarelto and lisinopril and metoprolol were doubled. She notes mild swelling in feet. Weight  is down. Intermittent cough. On exam she is a pleasant WF in NAD No JVD or bruits. Lungs are clear.  CV IRR with soft systolic ejection murmur. No S3 No edema.   Labs, Ecg and Xrays reviewed.  In summary she presents with predominant symptoms of dyspnea. She has atypical chest pain that is quite different from her prior angina. I recommend a Chest CTA to rule out PE. It is possible that her symptoms are related to new atrial flutter. Will interrogate pacemaker to see when Aflutter started. Cycle cardiac enzymes. We need to achieve better BP control. Given cough will switch lisinopril to losartan 100 mg daily. Continue metoprolol for rate control. If above is negative will need to consider restoration of NSR to see if this will help her symptoms. Since she has not been anticoagulated more than a few days this would need to be a TEE CV although with prior atrial clip LA thrombus risk is lower.  Renia Mikelson [place], [place] 08/26/2015 12:59 PM

## 2015-08-26 NOTE — H&P (Signed)
History and Physical    [name] [id number] DOB: December 14, 1940 DOA: 08/26/2015  PCP: [name], MD  Patient coming from: Home.  Chief Complaint: Chest pain. Elevated blood pressure.  HPI: [name] is a 75 y.o. female with CAD status post CABG last year and drug-eluting stent placement April of this year, pacemaker placement for sick sinus syndrome, peripheral vascular disease, atrial fibrillation presents to the ER because of chest pressure and elevated blood pressure. Patient has been experiencing shortness of breath and chest pressure off and on for last 3-4 days. Patient had followed up with cardiologist on Friday, 2 days ago. Patient was found to be in A. fib with RVR and patient's metoprolol dose was increased to 50 mg twice a day and also since patient's blood pressure was elevated lisinopril dose was increased from 10 mg to 20 mg. This morning patient states around 2 AM patient started developing some chest pressure in the left anterior chest wall with no radiation. Had mild shortness of breath. Denies any productive cough fever or chills. EMS was called and patient was brought to the ER. After patient was given sublingual nitroglycerin patient's blood pressure and chest pressure improved and presently is chest pain-free. Patient is being admitted for further management of chest pain. Patient was restarted on xarelto 2 days ago. Initially a code STEMI was called but after evaluation by cardiologist code STEMI was canceled.  ED Course: See history of presenting illness.  Review of Systems: As per HPI, rest all negative.   Past Medical History  Diagnosis Date  . GLUCOSE INTOLERANCE 12/31/2009  . HYPERLIPIDEMIA 12/31/2009  . ANXIETY 12/31/2009  . DEPRESSION 12/31/2009  . MITRAL REGURGITATION 12/31/2009  . HYPERTENSION 12/31/2009  . CORONARY ARTERY DISEASE 12/31/2009  . MITRAL VALVE PROLAPSE 12/31/2009  . MENOPAUSE, EARLY 12/31/2009  . Unspecified urinary incontinence  12/31/2009  . ABDOMINAL PAIN, LOWER 12/31/2009  . DIVERTICULITIS, HX OF 12/31/2009  . Pacemaker 01/21/2012    MDT Adapta dual chamber  . Tachycardia-bradycardia syndrome ([place])     [name] 01/21/2012  . PAD (peripheral artery disease) ([place])   . Heart murmur     "used to say I did" (01/21/2012)  . OSTEOARTHRITIS, HAND 12/31/2009  . [place] DISEASE, CERVICAL 12/31/2009  . [place] DISEASE, LUMBAR 12/31/2009  . Symptomatic sinus bradycardia 01/22/2012  . PAF (paroxysmal atrial fibrillation) ([place]) 01/22/2012  . Sinus node dysfunction ([place]) 01/22/2012  . PVD (peripheral vascular disease), hx stents to bil SFAs 02/2010 01/22/2012  . PAD (peripheral artery disease) ([place])   . Atrial fibrillation with RVR ([place])   . NSTEMI (non-ST elevated myocardial infarction) ([place]) 05/11/2015    Past Surgical History  Procedure Laterality Date  . Cardiac catheterization  2005    "negative" (01/21/2012)  . Oophorectomy  ~1979  . Partial colectomy  2010  . Carpal tunnel release  2008    "right hand/thumb; carpal tunnel repair; got rid of arthritis" (01/21/2012)  . Insert / replace / remove pacemaker  01/21/2012    initial placement  . Vaginal hysterectomy  1975  . Posterior cervical laminectomy  1985  . Peripheral arterial stent graft  2012; 2012    "LLE; RLE" (01/21/2012)  . Facelift, lower 2/3  1995    "mini" (01/21/2012)  . Augmentation mammaplasty  1983  . Pacemaker insertion  01/21/2012    Medtronic Adapta, model# ADDRL1, serial# [id number]  . Lower arterial examination  10/28/2011    R. SFA stent mild-moderate mixed density plaque with elevated velocities  consistent with 50% diameter reduction. L. SFA stent moderate mixed denisty plaque at mid to distal level consistent with 50-69% diameter reduction.  . Cardiac catheterization  09/17/1999    Mild CAD involving proximal portion of LAD. Mitral valve prolapse w/ mitral regurg. No evidence of renal artery stenosis. Recommended readjustment of antihypertensive  medications.  . Persantine myoview stress test  02/25/2010    No scintigraphic evidence of inducible myocardial ischemia. No persantine EKG changes. Non-diagnositc for ishcemia. Low-risk, normal scan.  [name] Permanent pacemaker insertion N/A 01/21/2012    Procedure: PERMANENT PACEMAKER INSERTION;  Surgeon: Sanda Klein, MD;  Location: [facility]ATH [facility];  Service: Cardiovascular;  Laterality: N/A;  . Cardiac catheterization N/A 01/01/2015    Procedure: Left Heart Cath and Coronary Angiography;  Surgeon: Jettie Booze, MD;  Location: [facility];  Service: Cardiovascular;  Laterality: N/A;  . Coronary artery bypass graft N/A 01/04/2015    Procedure: CORONARY ARTERY BYPASS GRAFTING (CABG) x 3 using left internal mammory artery and greater saphenous vein right leg harvested endoscopically.;  Surgeon: Grace Isaac, MD;  LIMA-LAD, SVG-RI, SVG-PDA  . Tee without cardioversion N/A 01/04/2015    Procedure: TRANSESOPHAGEAL ECHOCARDIOGRAM (TEE);  Surgeon: Grace Isaac, MD;  Location: [facility];  Service: Open Heart Surgery;  Laterality: N/A;  . Clipping of atrial appendage N/A 01/04/2015    Procedure: CLIPPING OF ATRIAL APPENDAGE;  Surgeon: Grace Isaac, MD;  Location: [facility];  Service: Open Heart Surgery;  Laterality: N/A;  . Cardioversion N/A 02/01/2015    Procedure: CARDIOVERSION;  Surgeon: Lelon Perla, MD;  Location: [facility] ENDOSCOPY;  Service: Cardiovascular;  Laterality: N/A;  . Tee without cardioversion N/A 02/01/2015    Procedure: TRANSESOPHAGEAL ECHOCARDIOGRAM (TEE);  Surgeon: Lelon Perla, MD;  Location: [facility] ENDOSCOPY;  Service: Cardiovascular;  Laterality: N/A;  . Cardiac surgery      11/20/20116  . Cardiac catheterization N/A 05/12/2015    Procedure: Left Heart Cath and Coronary Angiography;  Surgeon: Troy Sine, MD;  Location: [facility] CV [facility];  Service: Cardiovascular;  Laterality: N/A;  . Cardiac catheterization N/A 05/12/2015    Procedure: Coronary Stent Intervention;   Surgeon: Troy Sine, MD;  Location: [facility] CV LAB;  Service: Cardiovascular;  Laterality: N/A;     reports that she has quit smoking. Her smoking use included Cigarettes. She has a 7.5 pack-year smoking history. She has never used smokeless tobacco. She reports that she drinks alcohol. She reports that she does not use illicit drugs.  Allergies  Allergen Reactions  . Brilinta [Ticagrelor] Shortness Of Breath  . Clonidine Derivatives Other (See Comments)    Lowers heart rate  . Atorvastatin     Possible cause of fatigue/malaise  . Exforge [Amlodipine Besylate-Valsartan] Itching and Rash    Family History  Problem Relation Age of Onset  . Heart disease Brother     3 brothers with heart disease (2 also with CVA)  . Hypertension Brother   . Heart disease Mother   . Hypertension Mother   . Stroke Mother   . Stroke Father   . Hypertension Brother   . Stroke Brother     Prior to Admission medications   Medication Sig Start Date End Date Taking? Authorizing Provider  aspirin 81 MG EC tablet Take 81 mg by mouth daily.    Yes Historical Provider, MD  cholecalciferol (VITAMIN D) 1000 UNITS tablet Take 1,000 Units by mouth daily.   Yes Historical Provider, MD  citalopram (CELEXA) 20  MG tablet Take 1 tablet (20 mg total) by mouth daily. 12/05/12  Yes Brett Canales, PA-C  clopidogrel (PLAVIX) 75 MG tablet Take 1 tablet (75 mg total) by mouth daily. 05/31/15  Yes Mihai Croitoru, MD  Cyanocobalamin (VITAMIN B-12 PO) Take 1 tablet by mouth daily.   Yes Historical Provider, MD  lisinopril (PRINIVIL,ZESTRIL) 20 MG tablet Take 20 mg by mouth daily.   Yes Historical Provider, MD  metoprolol (LOPRESSOR) 50 MG tablet Take 50 mg by mouth 2 (two) times daily.   Yes Historical Provider, MD  nitroGLYCERIN (NITROSTAT) 0.4 MG SL tablet Place 1 tablet (0.4 mg total) under the tongue every 5 (five) minutes x 3 doses as needed for chest pain. 05/15/15  Yes Brittainy Erie Noe, PA-C  rivaroxaban (XARELTO)  20 MG TABS tablet Take 20 mg by mouth daily with supper.   Yes Historical Provider, MD  rosuvastatin (CRESTOR) 10 MG tablet Take 1 tablet (10 mg total) by mouth daily. 07/15/15  Yes Mihai Croitoru, MD  vitamin E 400 UNIT capsule Take 400 Units by mouth daily.   Yes Historical Provider, MD  cyanocobalamin 500 MCG tablet Take 1 tablet (500 mcg total) by mouth daily. Patient not taking: Reported on 08/26/2015 08/08/14   Robyn Haber, MD  lisinopril (PRINIVIL,ZESTRIL) 10 MG tablet Take 1 tablet (10 mg total) by mouth daily. Patient not taking: Reported on 08/26/2015 05/15/15   Brittainy Erie Noe, PA-C  metoprolol tartrate (LOPRESSOR) 25 MG tablet Take 1 tablet (25 mg total) by mouth 2 (two) times daily. Patient not taking: Reported on 08/26/2015 02/13/15   Lonn Georgia, PA-C    Physical Exam: Filed Vitals:   08/26/15 0430 08/26/15 0445 08/26/15 0530 08/26/15 0545  BP: 136/109 150/113 140/93 142/97  Pulse: 75 75 74 79  Temp:      TempSrc:      Resp: 19 13 18 13   Height:      Weight:      SpO2: 100% 99% 100% 100%      Constitutional: Not in distress. Filed Vitals:   08/26/15 0430 08/26/15 0445 08/26/15 0530 08/26/15 0545  BP: 136/109 150/113 140/93 142/97  Pulse: 75 75 74 79  Temp:      TempSrc:      Resp: 19 13 18 13   Height:      Weight:      SpO2: 100% 99% 100% 100%   Eyes: Anicteric. No pallor. ENMT: No discharge from the ears eyes nose and mouth. Neck: No neck rigidity no mass felt. No JVD appreciated. Respiratory: No rhonchi or crepitations. Cardiovascular: S1-S2 heard. Abdomen: Soft nontender bowel sounds present. Musculoskeletal: No edema. Skin: No rash. Neurologic: Alert awake oriented to time place and person. Moves all extremities. Psychiatric: Appears normal.   Labs on Admission: I have personally reviewed following labs and imaging studies  CBC:  Recent Labs Lab 08/26/15 0330 08/26/15 0345  WBC 5.5  --   NEUTROABS 2.6  --   HGB 11.3* 12.2  HCT 36.0  36.0  MCV 88.9  --   PLT 232  --    Basic Metabolic Panel:  Recent Labs Lab 08/26/15 0330 08/26/15 0345  NA  --  139  K  --  4.9  CL  --  102  GLUCOSE  --  121*  BUN  --  18  CREATININE  --  0.90  MG 2.1  --    GFR: Estimated Creatinine Clearance: 47.7 mL/min (by C-G formula based on Cr of 0.9).  Liver Function Tests: No results for input(s): AST, ALT, ALKPHOS, BILITOT, PROT, ALBUMIN in the last 168 hours. No results for input(s): LIPASE, AMYLASE in the last 168 hours. No results for input(s): AMMONIA in the last 168 hours. Coagulation Profile:  Recent Labs Lab 08/26/15 0330  INR 3.95*   Cardiac Enzymes: No results for input(s): CKTOTAL, CKMB, CKMBINDEX, TROPONINI in the last 168 hours. BNP (last 3 results) No results for input(s): PROBNP in the last 8760 hours. HbA1C: No results for input(s): HGBA1C in the last 72 hours. CBG: No results for input(s): GLUCAP in the last 168 hours. Lipid Profile: No results for input(s): CHOL, HDL, LDLCALC, TRIG, CHOLHDL, LDLDIRECT in the last 72 hours. Thyroid Function Tests: No results for input(s): TSH, T4TOTAL, FREET4, T3FREE, THYROIDAB in the last 72 hours. Anemia Panel: No results for input(s): VITAMINB12, FOLATE, FERRITIN, TIBC, IRON, RETICCTPCT in the last 72 hours. Urine analysis:    Component Value Date/Time   COLORURINE AMBER* 08/26/2015 0504   APPEARANCEUR CLEAR 08/26/2015 0504   LABSPEC 1.021 08/26/2015 0504   PHURINE 5.5 08/26/2015 0504   GLUCOSEU NEGATIVE 08/26/2015 0504   GLUCOSEU NEGATIVE 12/31/2009 1612   HGBUR NEGATIVE 08/26/2015 0504   BILIRUBINUR NEGATIVE 08/26/2015 0504   KETONESUR NEGATIVE 08/26/2015 0504   PROTEINUR 30* 08/26/2015 0504   UROBILINOGEN 1.0 06/03/2012 1610   NITRITE NEGATIVE 08/26/2015 0504   LEUKOCYTESUR MODERATE* 08/26/2015 0504   Sepsis Labs: @LABRCNTIP (procalcitonin:4,lacticidven:4) )No results found for this or any previous visit (from the past 240 hour(s)).   Radiological  Exams on Admission: Dg Chest 2 View  08/26/2015  CLINICAL DATA:  Shortness of breath. EXAM: CHEST  2 VIEW COMPARISON:  Jun 27, 2015 FINDINGS: No pneumothorax. Haziness over the bases is at least partially explained by overlapping soft tissues and implants. Mild pulmonary venous congestion is not excluded. No acute infiltrates are identified. Stable mild cardiomegaly. IMPRESSION: Haziness over the bases, at least partially explained by overlapping soft tissues and breast implants. Suspected mild pulmonary venous congestion. Electronically Signed   By: Dorise Bullion III M.D   On: 08/26/2015 04:55    EKG: Independently reviewed. A. fib rate controlled with PVCs.  Assessment/Plan Principal Problem:   Chest pain Active Problems:   Essential hypertension   S/P placement of cardiac pacemaker, medtronic adapta 01/21/12   PVD (peripheral vascular disease), hx stents to bil SFAs 02/2010   S/P CABG x 3 01/04/15    1. Chest pain - concerning for unstable angina. Since patient may require procedure I'm holding off patient xarelto and placing patient on heparin. Sublingual nitroglycerin when necessary. Cycle cardiac markers. Consult cardiology in a.m. Continue metoprolol aspirin Plavix and statins. 2. Hypertension uncontrolled - improved with sublingual nitroglycerin. Patient's metoprolol and lisinopril doses were recently increased. Continue. 3. A. fib presently rate controlled - chads 2 vasc score more than 2. Patient is on xarelto which will be changed to heparin in anticipation of procedure. Continue metoprolol. 4. Peripheral vascular disease - no acute issues. 5. History of CAD status post CABG status post stenting.   DVT prophylaxis: Heparin. Code Status: Full code.  Family Communication: Patient's family at the bedside.  Disposition Plan: Home.  Consults called: None.  Admission status: Observation. Telemetry.    Rise Patience MD [facility] Pager [phone].  If  7PM-7AM, please contact night-coverage [email or web address] Password [email or web address]  08/26/2015, 6:00 AM

## 2015-08-26 NOTE — Progress Notes (Signed)
CRITICAL VALUE ALERT  Critical value received:  Troponin 0.03  Date of notification:  08/26/2015  Time of notification:  0930  Critical value read back:Yes.    Nurse who received alert:  Grant Fontana BSN, RN  MD notified (1st page):  Dr. Eliseo Squires  Time of first page:  [phone]  MD notified (2nd page):  Time of second page:  Responding MD:  Dr. Eliseo Squires  Time MD responded:  [phone]

## 2015-08-26 NOTE — ED Provider Notes (Signed)
CSN: [id number]     Arrival date & time 08/26/15  0315 History   By signing my name below, I, Rowan Blase, attest that this documentation has been prepared under the direction and in the presence of Everlene Balls, MD . Electronically Signed: Rowan Blase, Scribe. 08/26/2015. 3:30 AM.   Chief Complaint  Patient presents with  . Code STEMI   The history is provided by the patient and the EMS personnel. No language interpreter was used.   HPI Comments:  KENNADEE KULIKOWSKI is a 75 y.o. female with PMHx of HLD, mitral regurgitation, HTN, CAD, PAD, a-fib, NSTEMI and CABG who presents to the Emergency Department via EMS complaining of right shoulder pain radiating into neck and head onset earlier tonight. Pt reports ongoing intermittent episodes of shortness of breath and fatigue. She also notes intermittent nausea. No alleviating factors noted. Pt has an on demand pacemaker. She notes recent h/o cough and bronchitis. Denies chest pain, fever, vomiting or  Diarrhea.  Past Medical History  Diagnosis Date  . GLUCOSE INTOLERANCE 12/31/2009  . HYPERLIPIDEMIA 12/31/2009  . ANXIETY 12/31/2009  . DEPRESSION 12/31/2009  . MITRAL REGURGITATION 12/31/2009  . HYPERTENSION 12/31/2009  . CORONARY ARTERY DISEASE 12/31/2009  . MITRAL VALVE PROLAPSE 12/31/2009  . MENOPAUSE, EARLY 12/31/2009  . Unspecified urinary incontinence 12/31/2009  . ABDOMINAL PAIN, LOWER 12/31/2009  . DIVERTICULITIS, HX OF 12/31/2009  . Pacemaker 01/21/2012    MDT Adapta dual chamber  . Tachycardia-bradycardia syndrome ([place])     [name] 01/21/2012  . PAD (peripheral artery disease) ([place])   . Heart murmur     "used to say I did" (01/21/2012)  . OSTEOARTHRITIS, HAND 12/31/2009  . [place] DISEASE, CERVICAL 12/31/2009  . [place] DISEASE, LUMBAR 12/31/2009  . Symptomatic sinus bradycardia 01/22/2012  . PAF (paroxysmal atrial fibrillation) ([place]) 01/22/2012  . Sinus node dysfunction ([place]) 01/22/2012  . PVD (peripheral vascular disease),  hx stents to bil SFAs 02/2010 01/22/2012  . PAD (peripheral artery disease) ([place])   . Atrial fibrillation with RVR ([place])   . NSTEMI (non-ST elevated myocardial infarction) ([place]) 05/11/2015   Past Surgical History  Procedure Laterality Date  . Cardiac catheterization  2005    "negative" (01/21/2012)  . Oophorectomy  ~1979  . Partial colectomy  2010  . Carpal tunnel release  2008    "right hand/thumb; carpal tunnel repair; got rid of arthritis" (01/21/2012)  . Insert / replace / remove pacemaker  01/21/2012    initial placement  . Vaginal hysterectomy  1975  . Posterior cervical laminectomy  1985  . Peripheral arterial stent graft  2012; 2012    "LLE; RLE" (01/21/2012)  . Facelift, lower 2/3  1995    "mini" (01/21/2012)  . Augmentation mammaplasty  1983  . Pacemaker insertion  01/21/2012    Medtronic Adapta, model# ADDRL1, serial# [id number]  . Lower arterial examination  10/28/2011    R. SFA stent mild-moderate mixed density plaque with elevated velocities consistent with 50% diameter reduction. L. SFA stent moderate mixed denisty plaque at mid to distal level consistent with 50-69% diameter reduction.  . Cardiac catheterization  09/17/1999    Mild CAD involving proximal portion of LAD. Mitral valve prolapse w/ mitral regurg. No evidence of renal artery stenosis. Recommended readjustment of antihypertensive medications.  . Persantine myoview stress test  02/25/2010    No scintigraphic evidence of inducible myocardial ischemia. No persantine EKG changes. Non-diagnositc for ishcemia. Low-risk, normal scan.  Marland Kitchen Permanent pacemaker insertion N/A 01/21/2012    Procedure: PERMANENT  PACEMAKER INSERTION;  Surgeon: Sanda Klein, MD;  Location: [facility] CATH LAB;  Service: Cardiovascular;  Laterality: N/A;  . Cardiac catheterization N/A 01/01/2015    Procedure: Left Heart Cath and Coronary Angiography;  Surgeon: Jettie Booze, MD;  Location: [facility] CV LAB;  Service: Cardiovascular;  Laterality:  N/A;  . Coronary artery bypass graft N/A 01/04/2015    Procedure: CORONARY ARTERY BYPASS GRAFTING (CABG) x 3 using left internal mammory artery and greater saphenous vein right leg harvested endoscopically.;  Surgeon: Grace Isaac, MD;  LIMA-LAD, SVG-RI, SVG-PDA  . Tee without cardioversion N/A 01/04/2015    Procedure: TRANSESOPHAGEAL ECHOCARDIOGRAM (TEE);  Surgeon: Grace Isaac, MD;  Location: [facility];  Service: Open Heart Surgery;  Laterality: N/A;  . Clipping of atrial appendage N/A 01/04/2015    Procedure: CLIPPING OF ATRIAL APPENDAGE;  Surgeon: Grace Isaac, MD;  Location: [facility];  Service: Open Heart Surgery;  Laterality: N/A;  . Cardioversion N/A 02/01/2015    Procedure: CARDIOVERSION;  Surgeon: Lelon Perla, MD;  Location[facility] END[facility]COPY;  Service: Cardiovascular;  Laterality: N/A;  . Tee without cardioversion N/A 02/01/2015    Procedure: TRANSESOPHAGEAL ECHOCARDIOGRAM (TEE);  Surgeon: Lelon Perla, MD;  [facility];  Service: Cardiovascular;  Laterality: N/A;  . Cardiac surgery      11/20/20116  . Cardiac catheterization N/A 05/12/2015    Procedure: Left Heart Cath and Coronary Angiography;  Surgeon: Troy Sine, MD;  Location: [place] CV LAB;  Service: Cardiovascular;  Laterality: N/A;  . Cardiac catheterization N/A 05/12/2015    Procedure: Coronary Stent Intervention;  Surgeon: Troy Sine, MD;  Location: [place] CV LAB;  Service: Cardiovascular;  Laterality: N/A;   Family History  Problem Relation Age of Onset  . Heart disease Brother     3 brothers with heart disease (2 also with CVA)  . Hypertension Brother   . Heart disease Mother   . Hypertension Mother   . Stroke Mother   . Stroke Father   . Hypertension Brother   . Stroke Brother    Social History  Substance Use Topics  . Smoking status: Former Smoker -- 0.75 packs/day for 10 years    Types: Cigarettes  . Smokeless tobacco: Never Used     Comment: 01/21/2012 "quit smoking ~  2002"  . Alcohol Use: Yes     Comment: 01/21/2012 "3 times/wk I have a couple mixed drinks"; 06/28/2015 "nothing in the last 3 weeks cause I haven't felt good"   OB History    No data available     Review of Systems  Constitutional: Positive for fatigue.  Respiratory: Positive for shortness of breath.   Gastrointestinal: Positive for nausea.  Musculoskeletal: Positive for arthralgias and neck pain.  Neurological: Positive for headaches.  All other systems reviewed and are negative.   Allergies  Brilinta; Clonidine derivatives; Atorvastatin; and Exforge  Home Medications   Prior to Admission medications   Medication Sig Start Date End Date Taking? Authorizing Provider  aspirin 81 MG EC tablet Take 81 mg by mouth daily.    Yes Historical Provider, MD  cholecalciferol (VITAMIN D) 1000 UNITS tablet Take 1,000 Units by mouth daily.   Yes Historical Provider, MD  citalopram (CELEXA) 20 MG tablet Take 1 tablet (20 mg total) by mouth daily. 12/05/12  Yes Brett Canales, PA-C  clopidogrel (PLAVIX) 75 MG tablet Take 1 tablet (75 mg total) by mouth daily. 05/31/15  Yes Mihai Croitoru, MD  Cyanocobalamin (VITAMIN B-12  PO) Take 1 tablet by mouth daily.   Yes Historical Provider, MD  lisinopril (PRINIVIL,ZESTRIL) 20 MG tablet Take 20 mg by mouth daily.   Yes Historical Provider, MD  metoprolol (LOPRESSOR) 50 MG tablet Take 50 mg by mouth 2 (two) times daily.   Yes Historical Provider, MD  nitroGLYCERIN (NITROSTAT) 0.4 MG SL tablet Place 1 tablet (0.4 mg total) under the tongue every 5 (five) minutes x 3 doses as needed for chest pain. 05/15/15  Yes Brittainy Erie Noe, PA-C  rivaroxaban (XARELTO) 20 MG TABS tablet Take 20 mg by mouth daily with supper.   Yes Historical Provider, MD  rosuvastatin (CRESTOR) 10 MG tablet Take 1 tablet (10 mg total) by mouth daily. 07/15/15  Yes Mihai Croitoru, MD  vitamin E 400 UNIT capsule Take 400 Units by mouth daily.   Yes Historical Provider, MD  cyanocobalamin 500  MCG tablet Take 1 tablet (500 mcg total) by mouth daily. Patient not taking: Reported on 08/26/2015 08/08/14   Robyn Haber, MD  lisinopril (PRINIVIL,ZESTRIL) 10 MG tablet Take 1 tablet (10 mg total) by mouth daily. Patient not taking: Reported on 08/26/2015 05/15/15   Brittainy Erie Noe, PA-C  metoprolol tartrate (LOPRESSOR) 25 MG tablet Take 1 tablet (25 mg total) by mouth 2 (two) times daily. Patient not taking: Reported on 08/26/2015 02/13/15   Evelene Croon Barrett, PA-C   BP 143/88 mmHg  Pulse 79  Temp(Src) 97.7 F (36.5 C) (Oral)  Resp 18  Ht 5' 4.5" (1.638 m)  Wt 132 lb (59.875 kg)  BMI 22.32 kg/m2  SpO2 100%   Physical Exam  Constitutional: She is oriented to person, place, and time. She appears well-developed and well-nourished. No distress.  HENT:  Head: Normocephalic and atraumatic.  Nose: Nose normal.  Mouth/Throat: Oropharynx is clear and moist. No oropharyngeal exudate.  Eyes: Conjunctivae and EOM are normal. Pupils are equal, round, and reactive to light. No scleral icterus.  Neck: Normal range of motion. Neck supple. No JVD present. No tracheal deviation present. No thyromegaly present.  Cardiovascular: Normal rate, regular rhythm and normal heart sounds.  Exam reveals no gallop and no friction rub.   No murmur heard. Pulmonary/Chest: Effort normal and breath sounds normal. No respiratory distress. She has no wheezes. She exhibits no tenderness.  Pacemaker in left chest wall  Abdominal: Soft. Bowel sounds are normal. She exhibits no distension and no mass. There is no tenderness. There is no rebound and no guarding.  Musculoskeletal: Normal range of motion. She exhibits no edema or tenderness.  Lymphadenopathy:    She has no cervical adenopathy.  Neurological: She is alert and oriented to person, place, and time. No cranial nerve deficit. She exhibits normal muscle tone.  Skin: Skin is warm and dry. No rash noted. No erythema. No pallor.  Nursing note and vitals  reviewed.   ED Course  Procedures  DIAGNOSTIC STUDIES:  Oxygen Saturation is 100% on room air, normal by my interpretation.    COORDINATION OF CARE:  3:27 AM Cardiologist cancelled code STEMI. Discussed treatment plan with pt at bedside and pt agreed to plan.  Labs Review Labs Reviewed  CBC WITH DIFFERENTIAL/PLATELET - Abnormal; Notable for the following:    Hemoglobin 11.3 (*)    All other components within normal limits  BRAIN NATRIURETIC PEPTIDE - Abnormal; Notable for the following:    B Natriuretic Peptide 647.2 (*)    All other components within normal limits  PROTIME-INR - Abnormal; Notable for the following:  Prothrombin Time 37.7 (*)    INR 3.95 (*)    All other components within normal limits  URINALYSIS, ROUTINE W REFLEX MICROSCOPIC (NOT AT [facility]) - Abnormal; Notable for the following:    Color, Urine AMBER (*)    Protein, ur 30 (*)    Leukocytes, UA MODERATE (*)    All other components within normal limits  URINE MICROSCOPIC-ADD ON - Abnormal; Notable for the following:    Squamous Epithelial / LPF 6-30 (*)    Bacteria, UA FEW (*)    Casts HYALINE CASTS (*)    All other components within normal limits  I-STAT CHEM 8, ED - Abnormal; Notable for the following:    Glucose, Bld 121 (*)    Calcium, Ion 1.46 (*)    All other components within normal limits  MAGNESIUM  TROPONIN I  TROPONIN I  TROPONIN I  COMPREHENSIVE METABOLIC PANEL  CBC WITH DIFFERENTIAL/PLATELET  APTT  HEPARIN LEVEL (UNFRACTIONATED)  Randolm Idol, ED    Imaging Review Dg Chest 2 View  08/26/2015  CLINICAL DATA:  Shortness of breath. EXAM: CHEST  2 VIEW COMPARISON:  Jun 27, 2015 FINDINGS: No pneumothorax. Haziness over the bases is at least partially explained by overlapping soft tissues and implants. Mild pulmonary venous congestion is not excluded. No acute infiltrates are identified. Stable mild cardiomegaly. IMPRESSION: Haziness over the bases, at least partially explained by  overlapping soft tissues and breast implants. Suspected mild pulmonary venous congestion. Electronically Signed   By: Dorise Bullion III M.D   On: 08/26/2015 04:55   I have personally reviewed and evaluated these images and lab results as part of my medical decision-making.   EKG Interpretation   Date/Time:  Monday August 26 2015 03:30:26 EDT Ventricular Rate:  78 PR Interval:    QRS Duration: 144 QT Interval:  458 QTC Calculation: 515 R Axis:   -77 Text Interpretation:  Atrial fibrillation Premature ventricular complexes  new since last tracing Confirmed by Glynn Octave [phone]) on  08/26/2015 3:52:42 AM      MDM   Final diagnoses:  Chest pain, unspecified chest pain type   Patient presents to the Ed for chest pain concerning for cardiac ideology.  Initial Code STEMI was called but cancelled by cardiology.  STE on EKG was actually afib/flutter waves.  Initial trop is 0, EKG is unchanged.  Patient continues to complain of CP and weakness. I spoke with Dr. Hal Hope who accepts patient to step down.    I personally performed the services described in this documentation, which was scribed in my presence. The recorded information has been reviewed and is accurate.      Everlene Balls, MD 08/26/15 [phone]

## 2015-08-26 NOTE — ED Notes (Signed)
Attempted report without success.  Nurse to call back when ready.

## 2015-08-26 NOTE — Progress Notes (Addendum)
Patient admitted after midnight, please see H&P.  C/o indigestion x 1 week. R/o PE with CTA Appreciate cardiology's assistance Calcium elevated--- appears to have been elevated before- recheck in [place] DO

## 2015-08-26 NOTE — Progress Notes (Signed)
Per device interrogation, patient has been consistently in aflutter ainxw 08/04/2015, prior to that, she had 2 hr episode on 07/31/2015, she also had 4 min episode on 05/10/2015. Pacer functioning normally.   Hilbert Corrigan PA Pager: [phone]

## 2015-08-26 NOTE — Progress Notes (Signed)
ANTICOAGULATION CONSULT NOTE - Initial Consult  Pharmacy Consult for Heparin Indication: atrial fibrillation  Allergies  Allergen Reactions  . Brilinta [Ticagrelor] Shortness Of Breath  . Clonidine Derivatives Other (See Comments)    Lowers heart rate  . Atorvastatin     Possible cause of fatigue/malaise  . Exforge [Amlodipine Besylate-Valsartan] Itching and Rash    Patient Measurements: Height: 5' 4.5" (163.8 cm) Weight: 132 lb (59.875 kg) IBW/kg (Calculated) : 55.85 Heparin Dosing Weight: 60 kg  Vital Signs: Temp: 97.7 F (36.5 C) (07/17 0330) Temp Source: Oral (07/17 0330) BP: 142/97 mmHg (07/17 0545) Pulse Rate: 79 (07/17 0545)  Labs:  Recent Labs  08/26/15 0330 08/26/15 0345  HGB 11.3* 12.2  HCT 36.0 36.0  PLT 232  --   LABPROT 37.7*  --   INR 3.95*  --   CREATININE  --  0.90    Estimated Creatinine Clearance: 47.7 mL/min (by C-G formula based on Cr of 0.9).   Medical History: Past Medical History  Diagnosis Date  . GLUCOSE INTOLERANCE 12/31/2009  . HYPERLIPIDEMIA 12/31/2009  . ANXIETY 12/31/2009  . DEPRESSION 12/31/2009  . MITRAL REGURGITATION 12/31/2009  . HYPERTENSION 12/31/2009  . CORONARY ARTERY DISEASE 12/31/2009  . MITRAL VALVE PROLAPSE 12/31/2009  . MENOPAUSE, EARLY 12/31/2009  . Unspecified urinary incontinence 12/31/2009  . ABDOMINAL PAIN, LOWER 12/31/2009  . DIVERTICULITIS, HX OF 12/31/2009  . Pacemaker 01/21/2012    MDT Adapta dual chamber  . Tachycardia-bradycardia syndrome ([place])     [name] 01/21/2012  . PAD (peripheral artery disease) ([place])   . Heart murmur     "used to say I did" (01/21/2012)  . OSTEOARTHRITIS, HAND 12/31/2009  . [place] DISEASE, CERVICAL 12/31/2009  . [place] DISEASE, LUMBAR 12/31/2009  . Symptomatic sinus bradycardia 01/22/2012  . PAF (paroxysmal atrial fibrillation) ([place]) 01/22/2012  . Sinus node dysfunction ([place]) 01/22/2012  . PVD (peripheral vascular disease), hx stents to bil SFAs 02/2010 01/22/2012  . PAD  (peripheral artery disease) ([place])   . Atrial fibrillation with RVR ([place])   . NSTEMI (non-ST elevated myocardial infarction) ([place]) 05/11/2015    Medications:  See electronic med rec  Assessment: 75 y.o. F presents with CP. Pt on Xarelto PTA (recently started 20mg  daily on 7/14 -samples given at Cardiology office). Last dose taken 7/16 1800. To hold Xarelto for now and start heparin. Xarelto likely affecting heparin level so will utilize PTT to monitor heparin until heparin level and PTT correlating. Baseline INR 3.95 (also probably due to Xarelto).  Goal of Therapy:  Heparin level 0.3-0.7 units/ml; PTT 66-102 sec Monitor platelets by anticoagulation protocol: Yes   Plan:  At 1800, start heparin gtt at 850 units/hr. No bolus Will f/u PTT 6 hr post start Daily heparin level, PTT, and CBC  Sherlon Handing, PharmD, BCPS Clinical pharmacist, pager [phone] 08/26/2015,6:08 AM

## 2015-08-27 ENCOUNTER — Telehealth: Payer: Self-pay | Admitting: Physician Assistant

## 2015-08-27 DIAGNOSIS — I5031 Acute diastolic (congestive) heart failure: Secondary | ICD-10-CM | POA: Diagnosis not present

## 2015-08-27 DIAGNOSIS — Z951 Presence of aortocoronary bypass graft: Secondary | ICD-10-CM | POA: Diagnosis not present

## 2015-08-27 DIAGNOSIS — I739 Peripheral vascular disease, unspecified: Secondary | ICD-10-CM

## 2015-08-27 DIAGNOSIS — I483 Typical atrial flutter: Secondary | ICD-10-CM

## 2015-08-27 DIAGNOSIS — R51 Headache: Secondary | ICD-10-CM

## 2015-08-27 DIAGNOSIS — Z950 Presence of cardiac pacemaker: Secondary | ICD-10-CM | POA: Diagnosis not present

## 2015-08-27 DIAGNOSIS — I10 Essential (primary) hypertension: Secondary | ICD-10-CM | POA: Diagnosis not present

## 2015-08-27 DIAGNOSIS — R079 Chest pain, unspecified: Secondary | ICD-10-CM | POA: Diagnosis not present

## 2015-08-27 LAB — BASIC METABOLIC PANEL
Anion gap: 6 (ref 5–15)
BUN: 15 mg/dL (ref 6–20)
CO2: 29 mmol/L (ref 22–32)
Calcium: 9.9 mg/dL (ref 8.9–10.3)
Chloride: 100 mmol/L — ABNORMAL LOW (ref 101–111)
Creatinine, Ser: 1.23 mg/dL — ABNORMAL HIGH (ref 0.44–1.00)
GFR calc Af Amer: 48 mL/min — ABNORMAL LOW (ref >60)
GFR calc non Af Amer: 42 mL/min — ABNORMAL LOW (ref >60)
Glucose, Bld: 127 mg/dL — ABNORMAL HIGH (ref 65–99)
Potassium: 4.3 mmol/L (ref 3.5–5.1)
Sodium: 135 mmol/L (ref 135–145)

## 2015-08-27 LAB — HEPARIN LEVEL (UNFRACTIONATED): Heparin Unfractionated: >2.2 IU/mL — ABNORMAL HIGH (ref 0.30–0.70)

## 2015-08-27 LAB — APTT: aPTT: 75 seconds — ABNORMAL HIGH (ref 24–37)

## 2015-08-27 MED ORDER — METHOCARBAMOL 500 MG PO TABS
500.0000 mg | ORAL_TABLET | Freq: Four times a day (QID) | ORAL | Status: DC | PRN
  Administered 2015-08-27: 500 mg via ORAL
  Filled 2015-08-27: qty 1

## 2015-08-27 MED ORDER — FUROSEMIDE 20 MG PO TABS: 20.0000 mg | ORAL_TABLET | Freq: Every day | ORAL | Status: DC

## 2015-08-27 MED ORDER — LOSARTAN POTASSIUM 25 MG PO TABS: 25.0000 mg | ORAL_TABLET | Freq: Every day | ORAL | Status: DC

## 2015-08-27 MED ORDER — OXYCODONE HCL 5 MG PO TABS
5.0000 mg | ORAL_TABLET | Freq: Four times a day (QID) | ORAL | Status: DC | PRN
  Administered 2015-08-27: 5 mg via ORAL
  Filled 2015-08-27: qty 1

## 2015-08-27 MED ORDER — FUROSEMIDE 20 MG PO TABS
20.0000 mg | ORAL_TABLET | Freq: Every day | ORAL | Status: DC
  Administered 2015-08-27: 20 mg via ORAL
  Filled 2015-08-27: qty 1

## 2015-08-27 NOTE — Progress Notes (Signed)
TELEMETRY: Reviewed telemetry pt in Atrial flutter with V pacing rate 75: Filed Vitals:   08/26/15 2028 08/26/15 2235 08/27/15 0435 08/27/15 1000  BP: 150/87 159/90 140/83 103/58  Pulse: 77 76 75   Temp: 98.5 F (36.9 C)  97.7 F (36.5 C)   TempSrc: Oral  Oral   Resp: 18  18   Height:      Weight:      SpO2: 100%  98%     Intake/Output Summary (Last 24 hours) at 08/27/15 1201 Last data filed at 08/27/15 [id number]  Gross per 24 hour  Intake      0 ml  Output   1600 ml  Net  -1600 ml   Filed Weights   08/26/15 0323  Weight: 132 lb (59.875 kg)    Subjective Feels better. Dyspnea improved. No chest pain.  Marland Kitchen aspirin EC  81 mg Oral Daily  . cholecalciferol  1,000 Units Oral Daily  . citalopram  20 mg Oral Daily  . clopidogrel  75 mg Oral Daily  . losartan  50 mg Oral Daily  . metoprolol  50 mg Oral BID  . rosuvastatin  10 mg Oral Daily  . vitamin E  400 Units Oral Daily   . heparin 850 Units/hr (08/26/15 1822)    LABS: Basic Metabolic Panel:  Recent Labs  08/26/15 0330  08/26/15 0837 08/27/15 0155  NA  --   < > 135 135  K  --   < > 4.7 4.3  CL  --   < > 106 100*  CO2  --   --  26 29  GLUCOSE  --   < > 106* 127*  BUN  --   < > 16 15  CREATININE  --   < > 0.86 1.23*  CALCIUM  --   --  10.8* 9.9  MG 2.1  --   --   --   < > = values in this interval not displayed. Liver Function Tests:  Recent Labs  08/26/15 0837  AST 29  ALT 21  ALKPHOS 147*  BILITOT 0.9  PROT 6.4*  ALBUMIN 3.3*   No results for input(s): LIPASE, AMYLASE in the last 72 hours. CBC:  Recent Labs  08/26/15 0330 08/26/15 0345 08/26/15 0837  WBC 5.5  --  4.7  NEUTROABS 2.6  --  2.1  HGB 11.3* 12.2 10.4*  HCT 36.0 36.0 33.3*  MCV 88.9  --  88.8  PLT 232  --  194   Cardiac Enzymes:  Recent Labs  08/26/15 0837 08/26/15 1155 08/26/15 1809  TROPONINI 0.03* 0.03* <0.03   BNP: No results for input(s): PROBNP in the last 72 hours. D-Dimer: No results for input(s): DDIMER in  the last 72 hours. Hemoglobin A1C: No results for input(s): HGBA1C in the last 72 hours. Fasting Lipid Panel: No results for input(s): CHOL, HDL, LDLCALC, TRIG, CHOLHDL, LDLDIRECT in the last 72 hours. Thyroid Function Tests: No results for input(s): TSH, T4TOTAL, T3FREE, THYROIDAB in the last 72 hours.  Invalid input(s): FREET3   Radiology/Studies:  Dg Chest 2 View  08/26/2015  CLINICAL DATA:  Shortness of breath. EXAM: CHEST  2 VIEW COMPARISON:  Jun 27, 2015 FINDINGS: No pneumothorax. Haziness over the bases is at least partially explained by overlapping soft tissues and implants. Mild pulmonary venous congestion is not excluded. No acute infiltrates are identified. Stable mild cardiomegaly. IMPRESSION: Haziness over the bases, at least partially explained by overlapping soft tissues and breast implants. Suspected  mild pulmonary venous congestion. Electronically Signed   By: Dorise Bullion III M.D   On: 08/26/2015 04:55   Ct Angio Chest Pe W Or Wo Contrast  08/26/2015  CLINICAL DATA:  75 year old female with history of shortness of breath for the past several weeks with intermittent chest pain for the past several days. Severe headache. EXAM: CT ANGIOGRAPHY CHEST WITH CONTRAST TECHNIQUE: Multidetector CT imaging of the chest was performed using the standard protocol during bolus administration of intravenous contrast. Multiplanar CT image reconstructions and MIPs were obtained to evaluate the vascular anatomy. CONTRAST:  100 mL of Isovue 370. COMPARISON:  Chest CT 05/11/2015. FINDINGS: Cardiovascular: Heart size is moderately enlarged. There is no significant pericardial fluid, thickening or pericardial calcification. There is aortic atherosclerosis, as well as atherosclerosis of the great vessels of the mediastinum and the coronary arteries, including calcified atherosclerotic plaque in the left main, left anterior descending and right coronary arteries. Status post median sternotomy for CABG,  including LIMA to the LAD. Ectasia of ascending thoracic aorta (4.0 cm in diameter). Left atrial appendage ligation clip noted. Left-sided pacemaker/AICD with lead tips terminating in the right atrium and near the right ventricular apex. Mediastinum/Nodes: No pathologically enlarged mediastinal or hilar lymph nodes. Esophagus is unremarkable in appearance. Multiple prominent borderline enlarged and mildly enlarged left axillary and subpectoral lymph nodes, measuring up to 12 mm in short axis. Lungs/Pleura: There are patchy areas of mild ground-glass attenuation, and there is mild diffuse interlobular septal thickening throughout the lungs bilaterally, suggesting a background of mild interstitial pulmonary edema. Trace left and small right pleural effusions lying dependently. Scarring in the inferior segment of the lingula and antrerolateral aspect of the inferior right upper lobe. No acute consolidative airspace disease. Upper Abdomen: Aortic atherosclerosis. Musculoskeletal: Median sternotomy wires. Status post bilateral breast augmentation with implants which demonstrate extensive capsular calcification bilaterally. There are no aggressive appearing lytic or blastic lesions noted in the visualized portions of the skeleton. Review of the MIP images confirms the above findings. IMPRESSION: 1. No evidence of pulmonary embolism. 2. Cardiomegaly with mild interstitial pulmonary edema, trace left and small right pleural effusions; imaging findings suggestive of underlying congestive heart failure. 3. Aortic atherosclerosis, in addition to left main into vessel coronary artery disease. Status post median sternotomy for CABG, including LIMA to the LAD. 4. **An incidental finding of potential clinical significance has been found. Specifically, there there is mild ectasia of ascending thoracic aorta (4.0 cm in diameter). Recommend annual imaging followup by CTA or MRA. This recommendation follows 2010  ACCF/AHA/AATS/ACR/ASA/SCA/SCAI/SIR/STS/SVM Guidelines for the Diagnosis and Management of Patients with Thoracic Aortic Disease. Circulation. 2010; [id number].** 5. Multiple borderline enlarged and mildly enlarged left axillary and subpectoral lymph nodes. This is a nonspecific finding, and could be reactive, however, correlation with physical examination and mammography is recommended if not recently obtained. Electronically Signed   By: Vinnie Langton M.D.   On: 08/26/2015 14:09    PHYSICAL EXAM General: Pleasant, NAD Psych: Normal affect. Neuro: Alert and oriented X 3. Moves all extremities spontaneously. HEENT: Normal Neck: Supple without bruits or JVD. Lungs: Resp regular and unlabored, CTA. Heart: irregular. no s3, s4, or murmurs. Abdomen: Soft, non-tender, non-distended, BS + x 4.  Extremities: No clubbing, cyanosis or edema. DP/PT/Radials 2+ and equal bilaterally.  ASSESSMENT AND PLAN: 1. Acute diastolic CHF. CT of chest shows mild edema. BNP is elevated she responded well to IV lasix last night. Recommend continuing metoprolol, losartan, and lasix 20 mg daily.  2. Atrial flutter. Patient had Afib following CABG in December. She had DCCV at that time and was placed on amiodarone. Amiodarone was discontinued in March since no Afib noted on device follow up. She has been in [place] since June 25 and I think this is why she developed #1. Recommend resuming Xarelto 20 mg daily. Will arrange for outpatient TEE guided DCCV after at least 3 doses. May want to consider EP evaluation to get recommendations on ablation versus antiarrhythmic drug therapy.  3. CAD s/p CABG in Dec 2016. S/p PCI of native RCA and SVG to RCA in April 2017. Continue Plavix. I would stop ASA when back on Xarelto.   4. S/p pacemaker for symptomatic bradycardia.  5. HTN controlled.   6. HLD on crestor.   Present on Admission:  . Chest pain . S/P placement of cardiac pacemaker, medtronic adapta  01/21/12 . PVD (peripheral vascular disease), hx stents to bil SFAs 02/2010 . Essential hypertension  Signed, Alliya Marcon [place], [place] 08/27/2015 12:01 PM

## 2015-08-27 NOTE — Progress Notes (Signed)
SATURATION QUALIFICATIONS: (This note is used to comply with regulatory documentation for home oxygen)  Patient Saturations on Room Air at Rest = 97%  Patient Saturations on Room Air while Ambulating = 83%  Patient Saturations on 2 Liters of oxygen while Ambulating = 95%  Please briefly explain why patient needs home oxygen: Pt's oxygen saturations dropped to 83% on room air while ambulating.

## 2015-08-27 NOTE — Discharge Summary (Signed)
Physician Discharge Summary  [name] [id number] DOB: 1940/07/24 DOA: 08/26/2015  PCP: [name], MD  Admit date: 08/26/2015 Discharge date: 08/27/2015   Recommendations for Outpatient Follow-Up:   Resume your xarelto tonight when you get home outpatient TEE guided DCCV after at least 3 doses BMP 1 week re: cr and K Outpatient mamogram Home O2-- suspect need if from fluid, if after cardioversion she is not able to come off would get PFTs  Discharge Diagnosis:   Principal Problem:   Chest pain Active Problems:   Essential hypertension   S/P placement of cardiac pacemaker, medtronic adapta 01/21/12   PVD (peripheral vascular disease), hx stents to bil SFAs 02/2010   S/P CABG x 3 01/04/15   Discharge disposition:  Home.   Discharge Condition: Improved.  Diet recommendation: Low sodium, heart healthy.  Carbohydrate-modified.  Wound care: None.   History of Present Illness:   [name] is a 75 y.o. female with CAD status post CABG last year and drug-eluting stent placement April of this year, pacemaker placement for sick sinus syndrome, peripheral vascular disease, atrial fibrillation presents to the ER because of chest pressure and elevated blood pressure. Patient has been experiencing shortness of breath and chest pressure off and on for last 3-4 days. Patient had followed up with cardiologist on Friday, 2 days ago. Patient was found to be in A. fib with RVR and patient's metoprolol dose was increased to 50 mg twice a day and also since patient's blood pressure was elevated lisinopril dose was increased from 10 mg to 20 mg. This morning patient states around 2 AM patient started developing some chest pressure in the left anterior chest wall with no radiation. Had mild shortness of breath. Denies any productive cough fever or chills. EMS was called and patient was brought to the ER. After patient was given sublingual nitroglycerin patient's blood pressure and chest  pressure improved and presently is chest pain-free. Patient is being admitted for further management of chest pain. Patient was restarted on xarelto 2 days ago. Initially a code STEMI was called but after evaluation by cardiologist code STEMI was canceled.   Hospital Course by Problem:   Atypical chest pain -CTA negative -? Stress from selling house and moving  SOB:  -better after 1.6L diuresis -did de-sat with exertion--- suspect with further diuresis that patient can come off of O2  Headache:  -tylenol -robaxin PRN- resolved  PAF -xarelto -rate control per cards-- plan for outpatient cardioversion  h/o symptomatic bradycardia s/p Medtronic dual chamber PPM 2013  HTN: uncontrolled  HLD: on crestor  Alcohol use -no signs of withdrawal    Medical Consultants:    cards   Discharge Exam:   Filed Vitals:   08/27/15 0435 08/27/15 1000  BP: 140/83 103/58  Pulse: 75   Temp: 97.7 F (36.5 C)   Resp: 18    Filed Vitals:   08/26/15 2028 08/26/15 2235 08/27/15 0435 08/27/15 1000  BP: 150/87 159/90 140/83 103/58  Pulse: 77 76 75   Temp: 98.5 F (36.9 C)  97.7 F (36.5 C)   TempSrc: Oral  Oral   Resp: 18  18   Height:      Weight:      SpO2: 100%  98%     Gen:  NAD    The results of significant diagnostics from this hospitalization (including imaging, microbiology, ancillary and laboratory) are listed below for reference.     Procedures and Diagnostic Studies:   Dg  Chest 2 View  08/26/2015  CLINICAL DATA:  Shortness of breath. EXAM: CHEST  2 VIEW COMPARISON:  Jun 27, 2015 FINDINGS: No pneumothorax. Haziness over the bases is at least partially explained by overlapping soft tissues and implants. Mild pulmonary venous congestion is not excluded. No acute infiltrates are identified. Stable mild cardiomegaly. IMPRESSION: Haziness over the bases, at least partially explained by overlapping soft tissues and breast implants. Suspected mild  pulmonary venous congestion. Electronically Signed   By: Dorise Bullion III M.D   On: 08/26/2015 04:55   Ct Angio Chest Pe W Or Wo Contrast  08/26/2015  CLINICAL DATA:  75 year old female with history of shortness of breath for the past several weeks with intermittent chest pain for the past several days. Severe headache. EXAM: CT ANGIOGRAPHY CHEST WITH CONTRAST TECHNIQUE: Multidetector CT imaging of the chest was performed using the standard protocol during bolus administration of intravenous contrast. Multiplanar CT image reconstructions and MIPs were obtained to evaluate the vascular anatomy. CONTRAST:  100 mL of Isovue 370. COMPARISON:  Chest CT 05/11/2015. FINDINGS: Cardiovascular: Heart size is moderately enlarged. There is no significant pericardial fluid, thickening or pericardial calcification. There is aortic atherosclerosis, as well as atherosclerosis of the great vessels of the mediastinum and the coronary arteries, including calcified atherosclerotic plaque in the left main, left anterior descending and right coronary arteries. Status post median sternotomy for CABG, including LIMA to the LAD. Ectasia of ascending thoracic aorta (4.0 cm in diameter). Left atrial appendage ligation clip noted. Left-sided pacemaker/AICD with lead tips terminating in the right atrium and near the right ventricular apex. Mediastinum/Nodes: No pathologically enlarged mediastinal or hilar lymph nodes. Esophagus is unremarkable in appearance. Multiple prominent borderline enlarged and mildly enlarged left axillary and subpectoral lymph nodes, measuring up to 12 mm in short axis. Lungs/Pleura: There are patchy areas of mild ground-glass attenuation, and there is mild diffuse interlobular septal thickening throughout the lungs bilaterally, suggesting a background of mild interstitial pulmonary edema. Trace left and small right pleural effusions lying dependently. Scarring in the inferior segment of the lingula and  antrerolateral aspect of the inferior right upper lobe. No acute consolidative airspace disease. Upper Abdomen: Aortic atherosclerosis. Musculoskeletal: Median sternotomy wires. Status post bilateral breast augmentation with implants which demonstrate extensive capsular calcification bilaterally. There are no aggressive appearing lytic or blastic lesions noted in the visualized portions of the skeleton. Review of the MIP images confirms the above findings. IMPRESSION: 1. No evidence of pulmonary embolism. 2. Cardiomegaly with mild interstitial pulmonary edema, trace left and small right pleural effusions; imaging findings suggestive of underlying congestive heart failure. 3. Aortic atherosclerosis, in addition to left main into vessel coronary artery disease. Status post median sternotomy for CABG, including LIMA to the LAD. 4. **An incidental finding of potential clinical significance has been found. Specifically, there there is mild ectasia of ascending thoracic aorta (4.0 cm in diameter). Recommend annual imaging followup by CTA or MRA. This recommendation follows 2010 ACCF/AHA/AATS/ACR/ASA/SCA/SCAI/SIR/STS/SVM Guidelines for the Diagnosis and Management of Patients with Thoracic Aortic Disease. Circulation. 2010; [id number].** 5. Multiple borderline enlarged and mildly enlarged left axillary and subpectoral lymph nodes. This is a nonspecific finding, and could be reactive, however, correlation with physical examination and mammography is recommended if not recently obtained. Electronically Signed   By: Vinnie Langton M.D.   On: 08/26/2015 14:09     Labs:   Basic Metabolic Panel:  Recent Labs Lab 08/26/15 0330  08/26/15 0345 08/26/15 0837 08/27/15 0155  NA  --   --  139 135 135  K  --   < > 4.9 4.7 4.3  CL  --   --  102 106 100*  CO2  --   --   --  26 29  GLUCOSE  --   --  121* 106* 127*  BUN  --   --  18 16 15   CREATININE  --   --  0.90 0.86 1.23*  CALCIUM  --   --   --  10.8* 9.9  MG  2.1  --   --   --   --   < > = values in this interval not displayed. GFR Estimated Creatinine Clearance: 34.9 mL/min (by C-G formula based on Cr of 1.23). Liver Function Tests:  Recent Labs Lab 08/26/15 0837  AST 29  ALT 21  ALKPHOS 147*  BILITOT 0.9  PROT 6.4*  ALBUMIN 3.3*   No results for input(s): LIPASE, AMYLASE in the last 168 hours. No results for input(s): AMMONIA in the last 168 hours. Coagulation profile  Recent Labs Lab 08/26/15 0330  INR 3.95*    CBC:  Recent Labs Lab 08/26/15 0330 08/26/15 0345 08/26/15 0837  WBC 5.5  --  4.7  NEUTROABS 2.6  --  2.1  HGB 11.3* 12.2 10.4*  HCT 36.0 36.0 33.3*  MCV 88.9  --  88.8  PLT 232  --  194   Cardiac Enzymes:  Recent Labs Lab 08/26/15 0837 08/26/15 1155 08/26/15 1809  TROPONINI 0.03* 0.03* <0.03   BNP: Invalid input(s): POCBNP CBG: No results for input(s): GLUCAP in the last 168 hours. D-Dimer No results for input(s): DDIMER in the last 72 hours. Hgb A1c No results for input(s): HGBA1C in the last 72 hours. Lipid Profile No results for input(s): CHOL, HDL, LDLCALC, TRIG, CHOLHDL, LDLDIRECT in the last 72 hours. Thyroid function studies No results for input(s): TSH, T4TOTAL, T3FREE, THYROIDAB in the last 72 hours.  Invalid input(s): FREET3 Anemia work up No results for input(s): VITAMINB12, FOLATE, FERRITIN, TIBC, IRON, RETICCTPCT in the last 72 hours. Microbiology No results found for this or any previous visit (from the past 240 hour(s)).   Discharge Instructions:       Discharge Instructions    Diet - low sodium heart healthy    Complete by:  As directed      Discharge instructions    Complete by:  As directed   Resume your xarelto tonight when you get home outpatient TEE guided DCCV after at least 3 doses BMP 1 week re: cr and K     Increase activity slowly    Complete by:  As directed             Medication List    STOP taking these medications        aspirin 81 MG EC  tablet     lisinopril 10 MG tablet  Commonly known as:  PRINIVIL,ZESTRIL     lisinopril 20 MG tablet  Commonly known as:  PRINIVIL,ZESTRIL      TAKE these medications        cholecalciferol 1000 units tablet  Commonly known as:  VITAMIN D  Take 1,000 Units by mouth daily.     citalopram 20 MG tablet  Commonly known as:  CELEXA  Take 1 tablet (20 mg total) by mouth daily.     clopidogrel 75 MG tablet  Commonly known as:  PLAVIX  Take 1 tablet (75 mg total) by mouth daily.     furosemide 20  MG tablet  Commonly known as:  LASIX  Take 1 tablet (20 mg total) by mouth daily.     losartan 25 MG tablet  Commonly known as:  COZAAR  Take 1 tablet (25 mg total) by mouth daily.  Start taking on:  08/28/2015     metoprolol 50 MG tablet  Commonly known as:  LOPRESSOR  Take 50 mg by mouth 2 (two) times daily.     nitroGLYCERIN 0.4 MG SL tablet  Commonly known as:  NITROSTAT  Place 1 tablet (0.4 mg total) under the tongue every 5 (five) minutes x 3 doses as needed for chest pain.     rivaroxaban 20 MG Tabs tablet  Commonly known as:  XARELTO  Take 20 mg by mouth daily with supper.     rosuvastatin 10 MG tablet  Commonly known as:  CRESTOR  Take 1 tablet (10 mg total) by mouth daily.     VITAMIN B-12 PO  Take 1 tablet by mouth daily.     vitamin E 400 UNIT capsule  Take 400 Units by mouth daily.       Follow-up Information    Follow up with [facility].   Why:  Your TEE/cardioversion is scheduled for 08/30/15 at 12pm. PLEASE ARRIVE 10:30AM. Check in at the main entrance of the [facility]. Nothing to eat or drink after midnight the night before. OK to take morning medicines with a sip of water.   Contact information:   [place] [id number]       Time coordinating discharge: 35 min  Signed:  Craven Crean Alison Stalling   [facility] 08/27/2015, 2:32 PM

## 2015-08-27 NOTE — Progress Notes (Signed)
[place] for Heparin Indication: atrial fibrillation  Allergies  Allergen Reactions  . Brilinta [Ticagrelor] Shortness Of Breath  . Clonidine Derivatives Other (See Comments)    Lowers heart rate  . Atorvastatin     Possible cause of fatigue/malaise  . Exforge [Amlodipine Besylate-Valsartan] Itching and Rash    Patient Measurements: Height: 5' 4.5" (163.8 cm) Weight: 132 lb (59.875 kg) IBW/kg (Calculated) : 55.85 Heparin Dosing Weight: 60 kg  Vital Signs: Temp: 98.5 F (36.9 C) (07/17 2028) Temp Source: Oral (07/17 2028) BP: 159/90 mmHg (07/17 2235) Pulse Rate: 76 (07/17 2235)  Labs:  Recent Labs  08/26/15 0330 08/26/15 0345 08/26/15 0837 08/26/15 1155 08/26/15 1809 08/27/15 0155  HGB 11.3* 12.2 10.4*  --   --   --   HCT 36.0 36.0 33.3*  --   --   --   PLT 232  --  194  --   --   --   APTT  --   --  46*  --   --  75*  LABPROT 37.7*  --   --   --   --   --   INR 3.95*  --   --   --   --   --   HEPARINUNFRC  --   --  >2.20*  --   --   --   CREATININE  --  0.90 0.86  --   --  1.23*  TROPONINI  --   --  0.03* 0.03* <0.03  --     Estimated Creatinine Clearance: 34.9 mL/min (by C-G formula based on Cr of 1.23).   Assessment: 75 y.o. F presents with CP. Pt on Xarelto PTA (recently started 20mg  daily on 7/14 -samples given at Cardiology office). Last dose taken 7/16 1800. To hold Xarelto for now and start heparin. Using aPTT to guide dosing for now given Xarelto influence on anti-Xa level (basline was elevated at >2.2)  7/18 AM: aPTT is therapeutic x 1 on 850 units/hr of heparin  Goal of Therapy:  Heparin level 0.3-0.7 units/ml; PTT 66-102 sec Monitor platelets by anticoagulation protocol: Yes   Plan:  -Cont heparin at 850 units/hr -1200 confirmatory aPTT  Narda Bonds, PharmD Clinical Pharmacist

## 2015-08-27 NOTE — Progress Notes (Signed)
PROGRESS NOTE    [name]  [id number] DOB: August 25, 1940 DOA: 08/26/2015 PCP: [name], MD   Outpatient Specialists:     Brief Narrative:  [name] is a 75 y.o. female with CAD status post CABG last year and drug-eluting stent placement April of this year, pacemaker placement for sick sinus syndrome, peripheral vascular disease, atrial fibrillation presents to the ER because of chest pressure and elevated blood pressure. Patient has been experiencing shortness of breath and chest pressure off and on for last 3-4 days. Patient had followed up with cardiologist on Friday, 2 days ago. Patient was found to be in A. fib with RVR and patient's metoprolol dose was increased to 50 mg twice a day and also since patient's blood pressure was elevated lisinopril dose was increased from 10 mg to 20 mg.  Patient is under stress, moving houses.    Assessment & Plan:   Principal Problem:   Chest pain Active Problems:   Essential hypertension   S/P placement of cardiac pacemaker, medtronic adapta 01/21/12   PVD (peripheral vascular disease), hx stents to bil SFAs 02/2010   S/P CABG x 3 01/04/15   Atypical chest pain -CTA negative  -? Stress from selling house and moving  SOB:  -better after 1.6L diuresis  Headache:  -tylenol -robaxin PRN   PAF -heparin gtt -rate control per cards  h/o symptomatic bradycardia s/p Medtronic dual chamber PPM 2013  HTN: uncontrolled  HLD: on crestor  Alcohol use -watch for signs of withdrawal  DVT prophylaxis:  Heparin  Code Status: Full Code   Family Communication: Multiple family members at bedside  Disposition Plan:  Per cards   Consultants:   cards  Procedures:        Subjective: C/o headache since before admission   Objective: Filed Vitals:   08/26/15 1710 08/26/15 2028 08/26/15 2235 08/27/15 0435  BP: 152/92 150/87 159/90 140/83  Pulse: 85 77 76 75  Temp:  98.5 F (36.9 C)  97.7 F  (36.5 C)  TempSrc:  Oral  Oral  Resp:  18  18  Height:      Weight:      SpO2:  100%  98%    Intake/Output Summary (Last 24 hours) at 08/27/15 1035 Last data filed at 08/27/15 [id number]  Gross per 24 hour  Intake      0 ml  Output   1600 ml  Net  -1600 ml   Filed Weights   08/26/15 0323  Weight: 59.875 kg (132 lb)    Examination:  General exam: Appears calm and comfortable  Respiratory system: Clear to auscultation. Respiratory effort normal. Cardiovascular system: irregular, No pedal edema. Gastrointestinal system: Abdomen is nondistended, soft and nontender. No organomegaly or masses felt. Normal bowel sounds heard. Central nervous system: Alert and oriented. No focal neurological deficits.     Data Reviewed: I have personally reviewed following labs and imaging studies  CBC:  Recent Labs Lab 08/26/15 0330 08/26/15 0345 08/26/15 0837  WBC 5.5  --  4.7  NEUTROABS 2.6  --  2.1  HGB 11.3* 12.2 10.4*  HCT 36.0 36.0 33.3*  MCV 88.9  --  88.8  PLT 232  --  [id number]   Basic Metabolic Panel:  Recent Labs Lab 08/26/15 0330 08/26/15 0345 08/26/15 0837 08/27/15 0155  NA  --  139 135 135  K  --  4.9 4.7 4.3  CL  --  102 106 100*  CO2  --   --  26 29  GLUCOSE  --  121* 106* 127*  BUN  --  18 16 15   CREATININE  --  0.90 0.86 1.23*  CALCIUM  --   --  10.8* 9.9  MG 2.1  --   --   --    GFR: Estimated Creatinine Clearance: 34.9 mL/min (by C-G formula based on Cr of 1.23). Liver Function Tests:  Recent Labs Lab 08/26/15 0837  AST 29  ALT 21  ALKPHOS 147*  BILITOT 0.9  PROT 6.4*  ALBUMIN 3.3*   No results for input(s): LIPASE, AMYLASE in the last 168 hours. No results for input(s): AMMONIA in the last 168 hours. Coagulation Profile:  Recent Labs Lab 08/26/15 0330  INR 3.95*   Cardiac Enzymes:  Recent Labs Lab 08/26/15 0837 08/26/15 1155 08/26/15 1809  TROPONINI 0.03* 0.03* <0.03   BNP (last 3 results) No results for input(s): PROBNP in the last  8760 hours. HbA1C: No results for input(s): HGBA1C in the last 72 hours. CBG: No results for input(s): GLUCAP in the last 168 hours. Lipid Profile: No results for input(s): CHOL, HDL, LDLCALC, TRIG, CHOLHDL, LDLDIRECT in the last 72 hours. Thyroid Function Tests: No results for input(s): TSH, T4TOTAL, FREET4, T3FREE, THYROIDAB in the last 72 hours. Anemia Panel: No results for input(s): VITAMINB12, FOLATE, FERRITIN, TIBC, IRON, RETICCTPCT in the last 72 hours. Urine analysis:    Component Value Date/Time   COLORURINE AMBER* 08/26/2015 0504   APPEARANCEUR CLEAR 08/26/2015 0504   LABSPEC 1.021 08/26/2015 0504   PHURINE 5.5 08/26/2015 0504   GLUCOSEU NEGATIVE 08/26/2015 0504   GLUCOSEU NEGATIVE 12/31/2009 1612   HGBUR NEGATIVE 08/26/2015 0504   BILIRUBINUR NEGATIVE 08/26/2015 0504   KETONESUR NEGATIVE 08/26/2015 0504   PROTEINUR 30* 08/26/2015 0504   UROBILINOGEN 1.0 06/03/2012 1610   NITRITE NEGATIVE 08/26/2015 0504   LEUKOCYTESUR MODERATE* 08/26/2015 0504     )No results found for this or any previous visit (from the past 240 hour(s)).    Anti-infectives    None       Radiology Studies: Dg Chest 2 View  08/26/2015  CLINICAL DATA:  Shortness of breath. EXAM: CHEST  2 VIEW COMPARISON:  Jun 27, 2015 FINDINGS: No pneumothorax. Haziness over the bases is at least partially explained by overlapping soft tissues and implants. Mild pulmonary venous congestion is not excluded. No acute infiltrates are identified. Stable mild cardiomegaly. IMPRESSION: Haziness over the bases, at least partially explained by overlapping soft tissues and breast implants. Suspected mild pulmonary venous congestion. Electronically Signed   By: Dorise Bullion III M.D   On: 08/26/2015 04:55   Ct Angio Chest Pe W Or Wo Contrast  08/26/2015  CLINICAL DATA:  75 year old female with history of shortness of breath for the past several weeks with intermittent chest pain for the past several days. Severe  headache. EXAM: CT ANGIOGRAPHY CHEST WITH CONTRAST TECHNIQUE: Multidetector CT imaging of the chest was performed using the standard protocol during bolus administration of intravenous contrast. Multiplanar CT image reconstructions and MIPs were obtained to evaluate the vascular anatomy. CONTRAST:  100 mL of Isovue 370. COMPARISON:  Chest CT 05/11/2015. FINDINGS: Cardiovascular: Heart size is moderately enlarged. There is no significant pericardial fluid, thickening or pericardial calcification. There is aortic atherosclerosis, as well as atherosclerosis of the great vessels of the mediastinum and the coronary arteries, including calcified atherosclerotic plaque in the left main, left anterior descending and right coronary arteries. Status post median sternotomy for CABG, including LIMA to the LAD. Ectasia  of ascending thoracic aorta (4.0 cm in diameter). Left atrial appendage ligation clip noted. Left-sided pacemaker/AICD with lead tips terminating in the right atrium and near the right ventricular apex. Mediastinum/Nodes: No pathologically enlarged mediastinal or hilar lymph nodes. Esophagus is unremarkable in appearance. Multiple prominent borderline enlarged and mildly enlarged left axillary and subpectoral lymph nodes, measuring up to 12 mm in short axis. Lungs/Pleura: There are patchy areas of mild ground-glass attenuation, and there is mild diffuse interlobular septal thickening throughout the lungs bilaterally, suggesting a background of mild interstitial pulmonary edema. Trace left and small right pleural effusions lying dependently. Scarring in the inferior segment of the lingula and antrerolateral aspect of the inferior right upper lobe. No acute consolidative airspace disease. Upper Abdomen: Aortic atherosclerosis. Musculoskeletal: Median sternotomy wires. Status post bilateral breast augmentation with implants which demonstrate extensive capsular calcification bilaterally. There are no aggressive  appearing lytic or blastic lesions noted in the visualized portions of the skeleton. Review of the MIP images confirms the above findings. IMPRESSION: 1. No evidence of pulmonary embolism. 2. Cardiomegaly with mild interstitial pulmonary edema, trace left and small right pleural effusions; imaging findings suggestive of underlying congestive heart failure. 3. Aortic atherosclerosis, in addition to left main into vessel coronary artery disease. Status post median sternotomy for CABG, including LIMA to the LAD. 4. **An incidental finding of potential clinical significance has been found. Specifically, there there is mild ectasia of ascending thoracic aorta (4.0 cm in diameter). Recommend annual imaging followup by CTA or MRA. This recommendation follows 2010 ACCF/AHA/AATS/ACR/ASA/SCA/SCAI/SIR/STS/SVM Guidelines for the Diagnosis and Management of Patients with Thoracic Aortic Disease. Circulation. 2010; [id number].** 5. Multiple borderline enlarged and mildly enlarged left axillary and subpectoral lymph nodes. This is a nonspecific finding, and could be reactive, however, correlation with physical examination and mammography is recommended if not recently obtained. Electronically Signed   By: Vinnie Langton M.D.   On: 08/26/2015 14:09        Scheduled Meds: . aspirin EC  81 mg Oral Daily  . cholecalciferol  1,000 Units Oral Daily  . citalopram  20 mg Oral Daily  . clopidogrel  75 mg Oral Daily  . losartan  50 mg Oral Daily  . metoprolol  50 mg Oral BID  . rosuvastatin  10 mg Oral Daily  . vitamin E  400 Units Oral Daily   Continuous Infusions: . heparin 850 Units/hr (08/26/15 1822)        Time spent: 35 min    [place], DO [facility] Pager [phone]  If 7PM-7AM, please contact night-coverage [email or web address] Password [facility] 08/27/2015, 10:35 AM

## 2015-08-27 NOTE — Care Management Note (Signed)
Case Management Note Marvetta Gibbons RN, BSN Unit 2W-Case Manager [phone]  Patient Details  Name: [name] MRN: [id number] Date of Birth: [birth year]  Subjective/Objective:     Pt admitted with chest pain               Action/Plan: PTA pt lived at home- per bedside RN pt will need home 02- per chart pt does not have qualifying dx for home 02- order has been placed per MD for home 02- contacted Jermaine with [facility] for home 02 needs- to f/u with pt regarding cost and deliver portable tank to room prior to discharge.   Expected Discharge Date:     08/27/15             Expected Discharge Plan:  Home/Self Care  In-House Referral:     Discharge planning Services  CM Consult  Post Acute Care Choice:  Durable Medical Equipment Choice offered to:  Patient  DME Arranged:  Oxygen DME Agency:  [place]:    [facility] Agency:     Status of Service:  Completed, signed off  If discussed at [place] of Stay Meetings, dates discussed:    Additional Comments:  Dawayne Patricia, RN 08/27/2015, 11:18 PM

## 2015-08-27 NOTE — Telephone Encounter (Signed)
Per d/w Dr. Martinique, scheduled TEE/DCCV for Friday. Anticipate 3 doses of Xarelto before (tonight, Wed, Thurs). Discussed instructions with patient - arrive at 10:30am for 12pm case. NPO after midnight except sips with meds that morning. OR Booking [id number].  Dayna Dunn PA-C

## 2015-08-27 NOTE — Progress Notes (Signed)
Pt has been discharged home with family. CCMD was notified and telemetry box was removed. IV was removed with no complications. Pt received discharge teaching and prescriptions; all questions were answered. Pt left the unit via wheelchair and was accompanied by pt's daughter and a Chartered certified accountant. Pt left with all of her belongings. Pt was also discharged with an oxygen tank for home use. Pt was in no distress at time of discharge.   Grant Fontana BSN, RN

## 2015-08-28 ENCOUNTER — Telehealth: Payer: Self-pay | Admitting: Cardiovascular Disease

## 2015-08-28 LAB — HEPARIN LEVEL (UNFRACTIONATED): Heparin Unfractionated: 1.81 IU/mL — ABNORMAL HIGH (ref 0.30–0.70)

## 2015-08-28 NOTE — Telephone Encounter (Signed)
I spoke with the pt and made her aware that procedure is still scheduled on Friday. The pt said a family friend had recommended Dr Hoyt Koch but the pt was not aware that we have EP physicians within our practice.  The pt would like to be referred to an EP physician ASAP to discuss ablation.  She would like Dr Croitoru's recommendation about which MD would be most appropriate to see either within our organization or Dr Hoyt Koch. I will forward this message to Dr C.

## 2015-08-28 NOTE — Telephone Encounter (Signed)
I would recommend Dr. Rayann Heman or Dr. Curt Bears and please make the referral if she so wishes. [name]

## 2015-08-28 NOTE — Telephone Encounter (Signed)
I spoke with the pt and she is already scheduled to see Roderic Palau NP on 09/06/15.  The pt does not want to see an NP and prefers to see Dr Rayann Heman on 09/09/15 as recommended by Dr Sallyanne Kuster.  Appointment scheduled.

## 2015-08-28 NOTE — Telephone Encounter (Signed)
New Message  Pt requested to speak w/ RN about taking xanax today- wanted to discuss possible drug interaction. Please call back and discuss.  a

## 2015-08-28 NOTE — Telephone Encounter (Signed)
I spoke with the pt and she has a Rx for Alprazolam 0.5mg  PRN that was prescribed her PCP 2 years ago after her husband passed away.  I made her aware that I cannot advise her to take this medication because it has expired.  I told the pt that she would need to contact her PCP to obtain a new Rx.

## 2015-08-28 NOTE — Telephone Encounter (Signed)
New message      Pt is having a cardioversion on Friday.  She want a second opinion on having an ablation.  Please send referral to Dr Hoyt Koch in [place]--fax [phone].  His phone number is [phone].  She want to make sure the doctor knows she will have the cardioversion on friday

## 2015-08-29 ENCOUNTER — Encounter: Payer: Self-pay | Admitting: *Deleted

## 2015-08-30 ENCOUNTER — Ambulatory Visit (HOSPITAL_COMMUNITY): Payer: Commercial Managed Care - HMO | Admitting: Anesthesiology

## 2015-08-30 ENCOUNTER — Encounter (HOSPITAL_COMMUNITY)
Admission: RE | Disposition: A | Discharge status: Home / Self Care | Payer: Self-pay | Source: Ambulatory Visit | Attending: Cardiovascular Disease

## 2015-08-30 ENCOUNTER — Ambulatory Visit (HOSPITAL_BASED_OUTPATIENT_CLINIC_OR_DEPARTMENT_OTHER): Payer: Commercial Managed Care - HMO

## 2015-08-30 ENCOUNTER — Ambulatory Visit (HOSPITAL_COMMUNITY)
Admission: RE | Admit: 2015-08-30 | Discharge: 2015-08-30 | Disposition: A | Discharge status: Home / Self Care | Payer: Commercial Managed Care - HMO | Source: Ambulatory Visit | Attending: Cardiovascular Disease | Admitting: Cardiovascular Disease

## 2015-08-30 DIAGNOSIS — M1990 Unspecified osteoarthritis, unspecified site: Secondary | ICD-10-CM | POA: Diagnosis not present

## 2015-08-30 DIAGNOSIS — Z7902 Long term (current) use of antithrombotics/antiplatelets: Secondary | ICD-10-CM | POA: Diagnosis not present

## 2015-08-30 DIAGNOSIS — Z951 Presence of aortocoronary bypass graft: Secondary | ICD-10-CM | POA: Insufficient documentation

## 2015-08-30 DIAGNOSIS — Z950 Presence of cardiac pacemaker: Secondary | ICD-10-CM | POA: Insufficient documentation

## 2015-08-30 DIAGNOSIS — Z87891 Personal history of nicotine dependence: Secondary | ICD-10-CM | POA: Diagnosis not present

## 2015-08-30 DIAGNOSIS — I480 Paroxysmal atrial fibrillation: Secondary | ICD-10-CM | POA: Diagnosis not present

## 2015-08-30 DIAGNOSIS — I484 Atypical atrial flutter: Secondary | ICD-10-CM | POA: Diagnosis not present

## 2015-08-30 DIAGNOSIS — I10 Essential (primary) hypertension: Secondary | ICD-10-CM | POA: Diagnosis not present

## 2015-08-30 DIAGNOSIS — I252 Old myocardial infarction: Secondary | ICD-10-CM | POA: Insufficient documentation

## 2015-08-30 DIAGNOSIS — F419 Anxiety disorder, unspecified: Secondary | ICD-10-CM | POA: Insufficient documentation

## 2015-08-30 DIAGNOSIS — E785 Hyperlipidemia, unspecified: Secondary | ICD-10-CM | POA: Insufficient documentation

## 2015-08-30 DIAGNOSIS — I351 Nonrheumatic aortic (valve) insufficiency: Secondary | ICD-10-CM

## 2015-08-30 DIAGNOSIS — Z7901 Long term (current) use of anticoagulants: Secondary | ICD-10-CM | POA: Diagnosis not present

## 2015-08-30 DIAGNOSIS — I4892 Unspecified atrial flutter: Secondary | ICD-10-CM | POA: Insufficient documentation

## 2015-08-30 DIAGNOSIS — Z7982 Long term (current) use of aspirin: Secondary | ICD-10-CM | POA: Insufficient documentation

## 2015-08-30 DIAGNOSIS — I2510 Atherosclerotic heart disease of native coronary artery without angina pectoris: Secondary | ICD-10-CM | POA: Diagnosis not present

## 2015-08-30 DIAGNOSIS — Z79899 Other long term (current) drug therapy: Secondary | ICD-10-CM | POA: Diagnosis not present

## 2015-08-30 DIAGNOSIS — I739 Peripheral vascular disease, unspecified: Secondary | ICD-10-CM | POA: Insufficient documentation

## 2015-08-30 HISTORY — PX: TEE WITHOUT CARDIOVERSION: SHX5443

## 2015-08-30 HISTORY — PX: CARDIOVERSION: SHX1299

## 2015-08-30 SURGERY — ECHOCARDIOGRAM, TRANSESOPHAGEAL
Anesthesia: Monitor Anesthesia Care

## 2015-08-30 MED ORDER — ONDANSETRON HCL 4 MG/2ML IJ SOLN: 4.0000 mg | Freq: Once | INTRAMUSCULAR | Status: DC | PRN

## 2015-08-30 MED ORDER — FENTANYL CITRATE (PF) 100 MCG/2ML IJ SOLN: 25.0000 ug | INTRAMUSCULAR | Status: DC | PRN

## 2015-08-30 MED ORDER — LIDOCAINE HCL (CARDIAC) 20 MG/ML IV SOLN
INTRAVENOUS | Status: DC | PRN
  Administered 2015-08-30: 50 mg via INTRATRACHEAL

## 2015-08-30 MED ORDER — PROPOFOL 500 MG/50ML IV EMUL
INTRAVENOUS | Status: DC | PRN
  Administered 2015-08-30: 100 ug/kg/min via INTRAVENOUS

## 2015-08-30 MED ORDER — BUTAMBEN-TETRACAINE-BENZOCAINE 2-2-14 % EX AERO
INHALATION_SPRAY | CUTANEOUS | Status: DC | PRN
  Administered 2015-08-30: 2 via TOPICAL

## 2015-08-30 MED ORDER — PROPOFOL 10 MG/ML IV BOLUS
INTRAVENOUS | Status: DC | PRN
  Administered 2015-08-30: 20 mg via INTRAVENOUS
  Administered 2015-08-30 (×2): 40 mg via INTRAVENOUS

## 2015-08-30 MED ORDER — SODIUM CHLORIDE 0.9 % IV SOLN
INTRAVENOUS | Status: DC | PRN
  Administered 2015-08-30: 12:00:00 via INTRAVENOUS

## 2015-08-30 MED ORDER — SODIUM CHLORIDE 0.9 % IV SOLN: INTRAVENOUS | Status: DC

## 2015-08-30 NOTE — Progress Notes (Signed)
   Echocardiogram Echocardiogram Transesophageal has been performed.  Jennette Dubin 08/30/2015, 12:36 PM

## 2015-08-30 NOTE — Op Note (Signed)
INDICATIONS: atrial flutter  PROCEDURE:   Informed consent was obtained prior to the procedure. The risks, benefits and alternatives for the procedure were discussed and the patient comprehended these risks.  Risks include, but are not limited to, cough, sore throat, vomiting, nausea, somnolence, esophageal and stomach trauma or perforation, bleeding, low blood pressure, aspiration, pneumonia, infection, trauma to the teeth and death.    After a procedural time-out, the oropharynx was anesthetized with 20% benzocaine spray.   During this procedure the patient was administered IV propofol by Anesthesiology (Dr. Gifford Shave).  The patient's heart rate, blood pressure, and oxygen saturation were monitored continuously during the procedure.   The transesophageal probe was inserted in the esophagus and stomach without difficulty and multiple views were obtained.  The patient was kept under observation until the patient left the procedure room.  The patient left the procedure room in stable condition.   Agitated microbubble saline contrast was not administered.  COMPLICATIONS:    There were no immediate complications.  FINDINGS:  Left atrial appendage clip. No flow is seen in the appendage. No thrombus in the left atrium. Moderate to severe tricuspid regurgitation. Prominent dyssynchrony is seen during occasional RV paced beats, but normal LVEF during beats with native AV conduction.  RECOMMENDATIONS:   Attempted overdrive atrial pacing via the pacemaker was unsuccessful. Proceed with DC cardioversion.  Time Spent Directly with the Patient:  30 minutes   [name] 08/30/2015, 12:19 PM

## 2015-08-30 NOTE — Transfer of Care (Signed)
Immediate Anesthesia Transfer of Care Note  Patient: [name]  Procedure(s) Performed: Procedure(s): TRANSESOPHAGEAL ECHOCARDIOGRAM (TEE) (N/A) CARDIOVERSION (N/A)  Patient Location: PACU  Anesthesia Type:MAC  Level of Consciousness: awake, oriented and patient cooperative  Airway & Oxygen Therapy: Patient Spontanous Breathing and Patient connected to nasal cannula oxygen  Post-op Assessment: Report given to RN, Post -op Vital signs reviewed and stable, Patient moving all extremities and Patient moving all extremities X 4  Post vital signs: Reviewed and stable  Last Vitals:  Filed Vitals:   08/30/15 1040 08/30/15 1230  BP: 165/83 128/69  Pulse: 77 81  Temp: 36.8 C 36.7 C  Resp: 19 14    Last Pain: There were no vitals filed for this visit.       Complications: No apparent anesthesia complications

## 2015-08-30 NOTE — Interval H&P Note (Signed)
History and Physical Interval Note:  08/30/2015 1:02 PM  [name]  has presented today for surgery, with the diagnosis of AFLUTTER  The various methods of treatment have been discussed with the patient and family. After consideration of risks, benefits and other options for treatment, the patient has consented to  Procedure(s): TRANSESOPHAGEAL ECHOCARDIOGRAM (TEE) (N/A) CARDIOVERSION (N/A) as a surgical intervention .  The patient's history has been reviewed, patient examined, no change in status, stable for surgery.  I have reviewed the patient's chart and labs.  Questions were answered to the patient's satisfaction.     Dao Memmott

## 2015-08-30 NOTE — Anesthesia Postprocedure Evaluation (Signed)
Anesthesia Post Note  Patient: [name]  Procedure(s) Performed: Procedure(s) (LRB): TRANSESOPHAGEAL ECHOCARDIOGRAM (TEE) (N/A) CARDIOVERSION (N/A)  Patient location during evaluation: Endoscopy Anesthesia Type: MAC Level of consciousness: awake and alert Pain management: pain level controlled Vital Signs Assessment: post-procedure vital signs reviewed and stable Respiratory status: spontaneous breathing, nonlabored ventilation, respiratory function stable and patient connected to nasal cannula oxygen Cardiovascular status: stable and blood pressure returned to baseline Anesthetic complications: no    Last Vitals:  Filed Vitals:   08/30/15 1240 08/30/15 1250  BP: 145/65 117/80  Pulse: 74 74  Temp:    Resp: 16 17    Last Pain: There were no vitals filed for this visit.               Catalina Gravel

## 2015-08-30 NOTE — Discharge Instructions (Signed)
Electrical Cardioversion, Care After °Refer to this sheet in the next few weeks. These instructions provide you with information on caring for yourself after your procedure. Your health care provider may also give you more specific instructions. Your treatment has been planned according to current medical practices, but problems sometimes occur. Call your health care provider if you have any problems or questions after your procedure. °WHAT TO EXPECT AFTER THE PROCEDURE °After your procedure, it is typical to have the following sensations: °· Some redness on the skin where the shocks were delivered. If this is tender, a sunburn lotion or hydrocortisone cream may help. °· Possible return of an abnormal heart rhythm within hours or days after the procedure. °HOME CARE INSTRUCTIONS °· Take medicines only as directed by your health care provider. Be sure you understand how and when to take your medicine. °· Learn how to feel your pulse and check it often. °· Limit your activity for 48 hours after the procedure or as directed by your health care provider. °· Avoid or minimize caffeine and other stimulants as directed by your health care provider. °SEEK MEDICAL CARE IF: °· You feel like your heart is beating too fast or your pulse is not regular. °· You have any questions about your medicines. °· You have bleeding that will not stop. °SEEK IMMEDIATE MEDICAL CARE IF: °· You are dizzy or feel faint. °· It is hard to breathe or you feel short of breath. °· There is a change in discomfort in your chest. °· Your speech is slurred or you have trouble moving an arm or leg on one side of your body. °· You get a serious muscle cramp that does not go away. °· Your fingers or toes turn cold or blue. °  °This information is not intended to replace advice given to you by your health care provider. Make sure you discuss any questions you have with your health care provider. °  °Document Released: 11/16/2012 Document Revised: 02/16/2014  Document Reviewed: 11/16/2012 °Elsevier Interactive Patient Education ©2016 [employer] Inc[employer] °Transesophageal Echocardiogram °Transesophageal echocardiography (TEE) is a picture test of your heart using sound waves. The pictures taken can give very detailed pictures of your heart. This can help your doctor see if there are problems with your heart. TEE can check: °· If your heart has blood clots in it. °· How well your heart valves are working. °· If you have an infection on the inside of your heart. °· Some of the major arteries of your heart. °· If your heart valve is working after a repair. °· Your heart before a procedure that uses a shock to your heart to get the rhythm back to normal. °BEFORE THE PROCEDURE °· Do not eat or drink for 6 hours before the procedure or as told by your doctor. °· Make plans to have someone drive you home after the procedure. Do not drive yourself home. °· An IV tube will be put in your arm. °PROCEDURE °· You will be given a medicine to help you relax (sedative). It will be given through the IV tube. °· A numbing medicine will be sprayed or gargled in the back of your throat to help numb it. °· The tip of the probe is placed into the back of your mouth. You will be asked to swallow. This helps to pass the probe into your esophagus. °· Once the tip of the probe is in the right place, your doctor can take pictures of your heart. °· You   may feel pressure at the back of your throat. °AFTER THE PROCEDURE °· You will be taken to a recovery area so the sedative can wear off. °· Your throat may be sore and scratchy. This will go away slowly over time. °· You will go home when you are fully awake and able to swallow liquids. °· You should have someone stay with you for the next 24 hours. °· Do not drive or operate machinery for the next 24 hours. °  °This information is not intended to replace advice given to you by your health care provider. Make sure you discuss any questions you have with  your health care provider. °  °Document Released: 11/23/2008 Document Revised: 01/31/2013 Document Reviewed: 07/28/2012 °Elsevier Interactive Patient Education ©2016 Elsevier Inc. ° °

## 2015-08-30 NOTE — Anesthesia Procedure Notes (Signed)
Procedure Name: MAC Date/Time: 08/30/2015 11:50 AM Performed by: Izora Gala Pre-anesthesia Checklist: Patient identified, Emergency Drugs available, Suction available and Patient being monitored Patient Re-evaluated:Patient Re-evaluated prior to inductionOxygen Delivery Method: Nasal cannula Placement Confirmation: positive ETCO2

## 2015-08-30 NOTE — Progress Notes (Signed)
After TEE excluded LA clot and before DC cardioversion, several attempts at atrial antitachycardia pacing were performed via her dual chamber Medtronic pacemaker. Arrhythmia cycle length approximately 336 ms. Overdrive pacing at [id number] ms, 280 ms, 250 ms, 220 ms failed to entrain or interrupt the flutter. Possible left atrial atypical flutter (Related to surgical MAZE or LA appendage clip). Successful DCCV performed thereafter.

## 2015-08-30 NOTE — Op Note (Signed)
Procedure: Electrical Cardioversion Indications:  Atrial Flutter  Procedure Details:  Consent: Risks of procedure as well as the alternatives and risks of each were explained to the (patient/caregiver).  Consent for procedure obtained.  Time Out: Verified patient identification, verified procedure, site/side was marked, verified correct patient position, special equipment/implants available, medications/allergies/relevent history reviewed, required imaging and test results available.  Performed  Patient placed on cardiac monitor, pulse oximetry, supplemental oxygen as necessary.  Sedation given: IV propofol, Dr. Gifford Shave Pacer pads placed anterior and posterior chest.  Cardioverted 1 time(s).  Cardioversion with synchronized biphasic 120J shock.  Evaluation: Findings: Post procedure EKG shows: atrial paced, ventricular sensed rhythm Complications: None Patient did tolerate procedure well.  Time Spent Directly with the Patient:  30 minutes   [name] 08/30/2015, 12:22 PM

## 2015-08-30 NOTE — Anesthesia Preprocedure Evaluation (Addendum)
Anesthesia Evaluation  Patient identified by MRN, date of birth, ID band Patient awake    Reviewed: Allergy & Precautions, NPO status , Patient's Chart, lab work & pertinent test results, reviewed documented beta blocker date and time   History of Anesthesia Complications Negative for: history of anesthetic complications  Airway Mallampati: II  TM Distance: >3 FB Neck ROM: Full    Dental  (+) Teeth Intact, Dental Advisory Given   Pulmonary former smoker,    Pulmonary exam normal breath sounds clear to auscultation       Cardiovascular hypertension, Pt. on medications and Pt. on home beta blockers + angina + CAD, + Past MI, + Cardiac Stents, + CABG and + Peripheral Vascular Disease  Normal cardiovascular exam+ pacemaker + Valvular Problems/Murmurs MVP  Rhythm:Regular Rate:Normal  CAD s/p CABG in Dec 2016. S/p PCI of native RCA and SVG to RCA in April 2017  Echo 07/11/15: Study Conclusions  - Left ventricle: The cavity size was normal. Systolic function was normal. The estimated ejection fraction was in the range of 55% to 60%. Wall motion was normal; there were no regional wall  motion abnormalities. Features are consistent with a pseudonormal  left ventricular filling pattern, with concomitant abnormal  relaxation and increased filling pressure (grade 2 diastolic dysfunction). - Aortic valve: Trileaflet; mildly thickened, mildly calcified  leaflets. There was moderate regurgitation. - Aorta: Ascending aortic diameter: 40 mm (S). - Ascending aorta: The ascending aorta was mildly dilated. - Mitral valve: There was mild regurgitation. - Left atrium: The atrium was mildly dilated. - Right ventricle: The cavity size was mildly dilated. Wall  thickness was normal. - Right atrium: The atrium was mildly dilated. - Pulmonary arteries: PA peak pressure: 39 mm Hg (S).   Neuro/Psych PSYCHIATRIC DISORDERS Anxiety Depression negative  neurological ROS  negative psych ROS   GI/Hepatic negative GI ROS, Neg liver ROS,   Endo/Other  negative endocrine ROS  Renal/GU negative Renal ROS     Musculoskeletal  (+) Arthritis , Osteoarthritis,    Abdominal   Peds  Hematology  (+) Blood dyscrasia, anemia ,   Anesthesia Other Findings Day of surgery medications reviewed with the patient.  Reproductive/Obstetrics negative OB ROS                          Anesthesia Physical Anesthesia Plan  ASA: III  Anesthesia Plan: MAC   Post-op Pain Management:    Induction: Intravenous  Airway Management Planned: Nasal Cannula  Additional Equipment:   Intra-op Plan:   Post-operative Plan:   Informed Consent: I have reviewed the patients History and Physical, chart, labs and discussed the procedure including the risks, benefits and alternatives for the proposed anesthesia with the patient or authorized representative who has indicated his/her understanding and acceptance.   Dental advisory given  Plan Discussed with: CRNA  Anesthesia Plan Comments: (Discussed risks/benefits/alternatives to MAC sedation including need for ventilatory support, hypotension, need for conversion to general anesthesia.  All patient questions answered.  Patient/guardian wishes to proceed.)        Anesthesia Quick Evaluation

## 2015-08-30 NOTE — H&P (View-Only) (Signed)
History and Physical    [name] [id number] DOB: 12/30/1940 DOA: 08/26/2015  PCP: [name], MD  Patient coming from: Home.  Chief Complaint: Chest pain. Elevated blood pressure.  HPI: [name] is a 75 y.o. female with CAD status post CABG last year and drug-eluting stent placement April of this year, pacemaker placement for sick sinus syndrome, peripheral vascular disease, atrial fibrillation presents to the ER because of chest pressure and elevated blood pressure. Patient has been experiencing shortness of breath and chest pressure off and on for last 3-4 days. Patient had followed up with cardiologist on Friday, 2 days ago. Patient was found to be in A. fib with RVR and patient's metoprolol dose was increased to 50 mg twice a day and also since patient's blood pressure was elevated lisinopril dose was increased from 10 mg to 20 mg. This morning patient states around 2 AM patient started developing some chest pressure in the left anterior chest wall with no radiation. Had mild shortness of breath. Denies any productive cough fever or chills. EMS was called and patient was brought to the ER. After patient was given sublingual nitroglycerin patient's blood pressure and chest pressure improved and presently is chest pain-free. Patient is being admitted for further management of chest pain. Patient was restarted on xarelto 2 days ago. Initially a code STEMI was called but after evaluation by cardiologist code STEMI was canceled.  ED Course: See history of presenting illness.  Review of Systems: As per HPI, rest all negative.   Past Medical History  Diagnosis Date  . GLUCOSE INTOLERANCE 12/31/2009  . HYPERLIPIDEMIA 12/31/2009  . ANXIETY 12/31/2009  . DEPRESSION 12/31/2009  . MITRAL REGURGITATION 12/31/2009  . HYPERTENSION 12/31/2009  . CORONARY ARTERY DISEASE 12/31/2009  . MITRAL VALVE PROLAPSE 12/31/2009  . MENOPAUSE, EARLY 12/31/2009  . Unspecified urinary incontinence  12/31/2009  . ABDOMINAL PAIN, LOWER 12/31/2009  . DIVERTICULITIS, HX OF 12/31/2009  . Pacemaker 01/21/2012    MDT Adapta dual chamber  . Tachycardia-bradycardia syndrome ([place])     [name] 01/21/2012  . PAD (peripheral artery disease) ([place])   . Heart murmur     "used to say I did" (01/21/2012)  . OSTEOARTHRITIS, HAND 12/31/2009  . [place] DISEASE, CERVICAL 12/31/2009  . [place] DISEASE, LUMBAR 12/31/2009  . Symptomatic sinus bradycardia 01/22/2012  . PAF (paroxysmal atrial fibrillation) ([place]) 01/22/2012  . Sinus node dysfunction ([place]) 01/22/2012  . PVD (peripheral vascular disease), hx stents to bil SFAs 02/2010 01/22/2012  . PAD (peripheral artery disease) ([place])   . Atrial fibrillation with RVR ([place])   . NSTEMI (non-ST elevated myocardial infarction) ([place]) 05/11/2015    Past Surgical History  Procedure Laterality Date  . Cardiac catheterization  2005    "negative" (01/21/2012)  . Oophorectomy  ~1979  . Partial colectomy  2010  . Carpal tunnel release  2008    "right hand/thumb; carpal tunnel repair; got rid of arthritis" (01/21/2012)  . Insert / replace / remove pacemaker  01/21/2012    initial placement  . Vaginal hysterectomy  1975  . Posterior cervical laminectomy  1985  . Peripheral arterial stent graft  2012; 2012    "LLE; RLE" (01/21/2012)  . Facelift, lower 2/3  1995    "mini" (01/21/2012)  . Augmentation mammaplasty  1983  . Pacemaker insertion  01/21/2012    Medtronic Adapta, model# ADDRL1, serial# [id number]  . Lower arterial examination  10/28/2011    R. SFA stent mild-moderate mixed density plaque with elevated velocities  consistent with 50% diameter reduction. L. SFA stent moderate mixed denisty plaque at mid to distal level consistent with 50-69% diameter reduction.  . Cardiac catheterization  09/17/1999    Mild CAD involving proximal portion of LAD. Mitral valve prolapse w/ mitral regurg. No evidence of renal artery stenosis. Recommended readjustment of antihypertensive  medications.  . Persantine myoview stress test  02/25/2010    No scintigraphic evidence of inducible myocardial ischemia. No persantine EKG changes. Non-diagnositc for ishcemia. Low-risk, normal scan.  Marland Kitchen Permanent pacemaker insertion N/A 01/21/2012    Procedure: PERMANENT PACEMAKER INSERTION;  Surgeon: Sanda Klein, MD;  Location: [facility] CATH [facility];  Service: Cardiovascular;  Laterality: N/A;  . Cardiac catheterization N/A 01/01/2015    Procedure: Left Heart Cath and Coronary Angiography;  Surgeon: Jettie Booze, MD;  Location: [facility];  Service: Cardiovascular;  Laterality: N/A;  . Coronary artery bypass graft N/A 01/04/2015    Procedure: CORONARY ARTERY BYPASS GRAFTING (CABG) x 3 using left internal mammory artery and greater saphenous vein right leg harvested endoscopically.;  Surgeon: Grace Isaac, MD;  LIMA-LAD, SVG-RI, SVG-PDA  . Tee without cardioversion N/A 01/04/2015    Procedure: TRANSESOPHAGEAL ECHOCARDIOGRAM (TEE);  Surgeon: Grace Isaac, MD;  Location: [facility];  Service: Open Heart Surgery;  Laterality: N/A;  . Clipping of atrial appendage N/A 01/04/2015    Procedure: CLIPPING OF ATRIAL APPENDAGE;  Surgeon: Grace Isaac, MD;  Location: [facility];  Service: Open Heart Surgery;  Laterality: N/A;  . Cardioversion N/A 02/01/2015    Procedure: CARDIOVERSION;  Surgeon: Lelon Perla, MD;  Location: [facility] ENDOSCOPY;  Service: Cardiovascular;  Laterality: N/A;  . Tee without cardioversion N/A 02/01/2015    Procedure: TRANSESOPHAGEAL ECHOCARDIOGRAM (TEE);  Surgeon: Lelon Perla, MD;  Location: [facility] ENDOSCOPY;  Service: Cardiovascular;  Laterality: N/A;  . Cardiac surgery      11/20/20116  . Cardiac catheterization N/A 05/12/2015    Procedure: Left Heart Cath and Coronary Angiography;  Surgeon: Troy Sine, MD;  Location: [facility] CV LAB;  Service: Cardiovascular;  Laterality: N/A;  . Cardiac catheterization N/A 05/12/2015    Procedure: Coronary Stent Intervention;   Surgeon: Troy Sine, MD;  Location: [facility] [facility] CV LAB;  Service: Cardiovascular;  Laterality: N/A;     reports that she has quit smoking. Her smoking use included Cigarettes. She has a 7.5 pack-year smoking history. She has never used smokeless tobacco. She reports that she drinks alcohol. She reports that she does not use illicit drugs.  Allergies  Allergen Reactions  . Brilinta [Ticagrelor] Shortness Of Breath  . Clonidine Derivatives Other (See Comments)    Lowers heart rate  . Atorvastatin     Possible cause of fatigue/malaise  . Exforge [Amlodipine Besylate-Valsartan] Itching and Rash    Family History  Problem Relation Age of Onset  . Heart disease Brother     3 brothers with heart disease (2 also with CVA)  . Hypertension Brother   . Heart disease Mother   . Hypertension Mother   . Stroke Mother   . Stroke Father   . Hypertension Brother   . Stroke Brother     Prior to Admission medications   Medication Sig Start Date End Date Taking? Authorizing Provider  aspirin 81 MG EC tablet Take 81 mg by mouth daily.    Yes Historical Provider, MD  cholecalciferol (VITAMIN D) 1000 UNITS tablet Take 1,000 Units by mouth daily.   Yes Historical Provider, MD  citalopram (CELEXA) 20  MG tablet Take 1 tablet (20 mg total) by mouth daily. 12/05/12  Yes Brett Canales, PA-C  clopidogrel (PLAVIX) 75 MG tablet Take 1 tablet (75 mg total) by mouth daily. 05/31/15  Yes Mihai Croitoru, MD  Cyanocobalamin (VITAMIN B-12 PO) Take 1 tablet by mouth daily.   Yes Historical Provider, MD  lisinopril (PRINIVIL,ZESTRIL) 20 MG tablet Take 20 mg by mouth daily.   Yes Historical Provider, MD  metoprolol (LOPRESSOR) 50 MG tablet Take 50 mg by mouth 2 (two) times daily.   Yes Historical Provider, MD  nitroGLYCERIN (NITROSTAT) 0.4 MG SL tablet Place 1 tablet (0.4 mg total) under the tongue every 5 (five) minutes x 3 doses as needed for chest pain. 05/15/15  Yes Brittainy Erie Noe, PA-C  rivaroxaban (XARELTO)  20 MG TABS tablet Take 20 mg by mouth daily with supper.   Yes Historical Provider, MD  rosuvastatin (CRESTOR) 10 MG tablet Take 1 tablet (10 mg total) by mouth daily. 07/15/15  Yes Mihai Croitoru, MD  vitamin E 400 UNIT capsule Take 400 Units by mouth daily.   Yes Historical Provider, MD  cyanocobalamin 500 MCG tablet Take 1 tablet (500 mcg total) by mouth daily. Patient not taking: Reported on 08/26/2015 08/08/14   Robyn Haber, MD  lisinopril (PRINIVIL,ZESTRIL) 10 MG tablet Take 1 tablet (10 mg total) by mouth daily. Patient not taking: Reported on 08/26/2015 05/15/15   Brittainy Erie Noe, PA-C  metoprolol tartrate (LOPRESSOR) 25 MG tablet Take 1 tablet (25 mg total) by mouth 2 (two) times daily. Patient not taking: Reported on 08/26/2015 02/13/15   Lonn Georgia, PA-C    Physical Exam: Filed Vitals:   08/26/15 0430 08/26/15 0445 08/26/15 0530 08/26/15 0545  BP: 136/109 150/113 140/93 142/97  Pulse: 75 75 74 79  Temp:      TempSrc:      Resp: 19 13 18 13   Height:      Weight:      SpO2: 100% 99% 100% 100%      Constitutional: Not in distress. Filed Vitals:   08/26/15 0430 08/26/15 0445 08/26/15 0530 08/26/15 0545  BP: 136/109 150/113 140/93 142/97  Pulse: 75 75 74 79  Temp:      TempSrc:      Resp: 19 13 18 13   Height:      Weight:      SpO2: 100% 99% 100% 100%   Eyes: Anicteric. No pallor. ENMT: No discharge from the ears eyes nose and mouth. Neck: No neck rigidity no mass felt. No JVD appreciated. Respiratory: No rhonchi or crepitations. Cardiovascular: S1-S2 heard. Abdomen: Soft nontender bowel sounds present. Musculoskeletal: No edema. Skin: No rash. Neurologic: Alert awake oriented to time place and person. Moves all extremities. Psychiatric: Appears normal.   Labs on Admission: I have personally reviewed following labs and imaging studies  CBC:  Recent Labs Lab 08/26/15 0330 08/26/15 0345  WBC 5.5  --   NEUTROABS 2.6  --   HGB 11.3* 12.2  HCT 36.0  36.0  MCV 88.9  --   PLT 232  --    Basic Metabolic Panel:  Recent Labs Lab 08/26/15 0330 08/26/15 0345  NA  --  139  K  --  4.9  CL  --  102  GLUCOSE  --  121*  BUN  --  18  CREATININE  --  0.90  MG 2.1  --    GFR: Estimated Creatinine Clearance: 47.7 mL/min (by C-G formula based on Cr of 0.9).  Liver Function Tests: No results for input(s): AST, ALT, ALKPHOS, BILITOT, PROT, ALBUMIN in the last 168 hours. No results for input(s): LIPASE, AMYLASE in the last 168 hours. No results for input(s): AMMONIA in the last 168 hours. Coagulation Profile:  Recent Labs Lab 08/26/15 0330  INR 3.95*   Cardiac Enzymes: No results for input(s): CKTOTAL, CKMB, CKMBINDEX, TROPONINI in the last 168 hours. BNP (last 3 results) No results for input(s): PROBNP in the last 8760 hours. HbA1C: No results for input(s): HGBA1C in the last 72 hours. CBG: No results for input(s): GLUCAP in the last 168 hours. Lipid Profile: No results for input(s): CHOL, HDL, LDLCALC, TRIG, CHOLHDL, LDLDIRECT in the last 72 hours. Thyroid Function Tests: No results for input(s): TSH, T4TOTAL, FREET4, T3FREE, THYROIDAB in the last 72 hours. Anemia Panel: No results for input(s): VITAMINB12, FOLATE, FERRITIN, TIBC, IRON, RETICCTPCT in the last 72 hours. Urine analysis:    Component Value Date/Time   COLORURINE AMBER* 08/26/2015 0504   APPEARANCEUR CLEAR 08/26/2015 0504   LABSPEC 1.021 08/26/2015 0504   PHURINE 5.5 08/26/2015 0504   GLUCOSEU NEGATIVE 08/26/2015 0504   GLUCOSEU NEGATIVE 12/31/2009 1612   HGBUR NEGATIVE 08/26/2015 0504   BILIRUBINUR NEGATIVE 08/26/2015 0504   KETONESUR NEGATIVE 08/26/2015 0504   PROTEINUR 30* 08/26/2015 0504   UROBILINOGEN 1.0 06/03/2012 1610   NITRITE NEGATIVE 08/26/2015 0504   LEUKOCYTESUR MODERATE* 08/26/2015 0504   Sepsis Labs: @LABRCNTIP (procalcitonin:4,lacticidven:4) )No results found for this or any previous visit (from the past 240 hour(s)).   Radiological  Exams on Admission: Dg Chest 2 View  08/26/2015  CLINICAL DATA:  Shortness of breath. EXAM: CHEST  2 VIEW COMPARISON:  Jun 27, 2015 FINDINGS: No pneumothorax. Haziness over the bases is at least partially explained by overlapping soft tissues and implants. Mild pulmonary venous congestion is not excluded. No acute infiltrates are identified. Stable mild cardiomegaly. IMPRESSION: Haziness over the bases, at least partially explained by overlapping soft tissues and breast implants. Suspected mild pulmonary venous congestion. Electronically Signed   By: Dorise Bullion III M.D   On: 08/26/2015 04:55    EKG: Independently reviewed. A. fib rate controlled with PVCs.  Assessment/Plan Principal Problem:   Chest pain Active Problems:   Essential hypertension   S/P placement of cardiac pacemaker, medtronic adapta 01/21/12   PVD (peripheral vascular disease), hx stents to bil SFAs 02/2010   S/P CABG x 3 01/04/15    1. Chest pain - concerning for unstable angina. Since patient may require procedure I'm holding off patient xarelto and placing patient on heparin. Sublingual nitroglycerin when necessary. Cycle cardiac markers. Consult cardiology in a.m. Continue metoprolol aspirin Plavix and statins. 2. Hypertension uncontrolled - improved with sublingual nitroglycerin. Patient's metoprolol and lisinopril doses were recently increased. Continue. 3. A. fib presently rate controlled - chads 2 vasc score more than 2. Patient is on xarelto which will be changed to heparin in anticipation of procedure. Continue metoprolol. 4. Peripheral vascular disease - no acute issues. 5. History of CAD status post CABG status post stenting.   DVT prophylaxis: Heparin. Code Status: Full code.  Family Communication: Patient's family at the bedside.  Disposition Plan: Home.  Consults called: None.  Admission status: Observation. Telemetry.    Rise Patience MD [facility] Pager [phone].  If  7PM-7AM, please contact night-coverage [email or web address] Password [email or web address]  08/26/2015, 6:00 AM

## 2015-08-31 ENCOUNTER — Encounter (HOSPITAL_COMMUNITY): Payer: Self-pay | Admitting: Cardiovascular Disease

## 2015-09-02 ENCOUNTER — Encounter (HOSPITAL_COMMUNITY): Payer: Self-pay | Admitting: *Deleted

## 2015-09-02 ENCOUNTER — Telehealth: Payer: Self-pay | Admitting: Cardiovascular Disease

## 2015-09-02 ENCOUNTER — Emergency Department (HOSPITAL_COMMUNITY)
Admission: EM | Admit: 2015-09-02 | Discharge: 2015-09-03 | Disposition: A | Discharge status: Home / Self Care | Payer: Commercial Managed Care - HMO | Attending: Dermatology | Admitting: Dermatology

## 2015-09-02 DIAGNOSIS — I10 Essential (primary) hypertension: Secondary | ICD-10-CM | POA: Diagnosis not present

## 2015-09-02 DIAGNOSIS — Z5321 Procedure and treatment not carried out due to patient leaving prior to being seen by health care provider: Secondary | ICD-10-CM | POA: Insufficient documentation

## 2015-09-02 LAB — COMPREHENSIVE METABOLIC PANEL
ALT: 29 U/L (ref 14–54)
AST: 33 U/L (ref 15–41)
Albumin: 3.7 g/dL (ref 3.5–5.0)
Alkaline Phosphatase: 166 U/L — ABNORMAL HIGH (ref 38–126)
Anion gap: 7 (ref 5–15)
BUN: 13 mg/dL (ref 6–20)
CO2: 30 mmol/L (ref 22–32)
Calcium: 11 mg/dL — ABNORMAL HIGH (ref 8.9–10.3)
Chloride: 101 mmol/L (ref 101–111)
Creatinine, Ser: 0.9 mg/dL (ref 0.44–1.00)
GFR calc Af Amer: >60 mL/min (ref >60)
GFR calc non Af Amer: >60 mL/min (ref >60)
Glucose, Bld: 117 mg/dL — ABNORMAL HIGH (ref 65–99)
Potassium: 4 mmol/L (ref 3.5–5.1)
Sodium: 138 mmol/L (ref 135–145)
Total Bilirubin: 0.7 mg/dL (ref 0.3–1.2)
Total Protein: 7 g/dL (ref 6.5–8.1)

## 2015-09-02 LAB — CBC WITH DIFFERENTIAL/PLATELET
Basophils Absolute: 0 10*3/uL (ref 0.0–0.1)
Basophils Relative: 0 %
Eosinophils Absolute: 0.3 10*3/uL (ref 0.0–0.7)
Eosinophils Relative: 5 %
HCT: 34.7 % — ABNORMAL LOW (ref 36.0–46.0)
Hemoglobin: 11 g/dL — ABNORMAL LOW (ref 12.0–15.0)
Lymphocytes Relative: 35 %
Lymphs Abs: 2 10*3/uL (ref 0.7–4.0)
MCH: 28 pg (ref 26.0–34.0)
MCHC: 31.7 g/dL (ref 30.0–36.0)
MCV: 88.3 fL (ref 78.0–100.0)
Monocytes Absolute: 0.6 10*3/uL (ref 0.1–1.0)
Monocytes Relative: 10 %
Neutro Abs: 2.9 10*3/uL (ref 1.7–7.7)
Neutrophils Relative %: 50 %
Platelets: 265 10*3/uL (ref 150–400)
RBC: 3.93 MIL/uL (ref 3.87–5.11)
RDW: 14.1 % (ref 11.5–15.5)
WBC: 5.9 10*3/uL (ref 4.0–10.5)

## 2015-09-02 LAB — URINE MICROSCOPIC-ADD ON

## 2015-09-02 LAB — URINALYSIS, ROUTINE W REFLEX MICROSCOPIC
Bilirubin Urine: NEGATIVE
Glucose, UA: NEGATIVE mg/dL
Hgb urine dipstick: NEGATIVE
Ketones, ur: NEGATIVE mg/dL
Nitrite: NEGATIVE
Protein, ur: NEGATIVE mg/dL
Specific Gravity, Urine: 1.008 (ref 1.005–1.030)
pH: 7 (ref 5.0–8.0)

## 2015-09-02 NOTE — ED Triage Notes (Signed)
Per c/o high blood pressure. Last pressure at home was 192/132. Pt went to UC then sent to ED for further eval. Pt also reports increase in urinary frequency, pt started lasix this month. Pt took all blood pressure medications today. Pt reports headache

## 2015-09-02 NOTE — Telephone Encounter (Signed)
Please stop losartan and go back to lisinopril 20 mg. Reportedly, lisinopril may have caused a cough and was switched during her July admission, but that is not clear to me. Check with her please. Did she really have a continuous dry cough ? (the notes talk more of dyspnea rather than cough). [name]

## 2015-09-02 NOTE — Telephone Encounter (Signed)
Returned patient call, recommendations given. She still has lisinopril on-hand and will enact med changes. Plans to follow up as scheduled w Dr. Rayann Heman next week, further instructions to follow that appt. Advised if BP continues to run high and she has new symptoms, call or consider urgent care/ER. Pt voiced understanding of instructions.

## 2015-09-02 NOTE — Telephone Encounter (Signed)
Pt had TEE cardioversion on Fri 7/21. F/u sched w Dr. Rayann Heman 7/31   Notes that she didn't take BP reading yesterday (doesn't unless she thinks it runs high) Felt poorly overnight, noted shoulder pain and couldn't sleep. last night, "I was determined to take something for it" - she took 1 tab oxycodone. Notes she still couldn't sleep well after taking med, head swirling, etc BP was running 182/122 this morning. -Notes she's had recurrent pain in right shoulder, running through neck (had this pain in hospital)    Notes she also has had a rash "ever since starting the losartan" -- broken out on face and back -  Was recently restarted on losartan but thinks she has allergy to this med or similar med. I reviewed, she does have a documented valsartan allergy (rash).  She rechecked BP this AM on phone, after morning meds - 184/130 w HR 60. reporting headache "feels tight and full".   Routed for advice.

## 2015-09-02 NOTE — Telephone Encounter (Signed)
New Message  Pt c/o BP issue: STAT if pt c/o blurred vision, one-sided weakness or slurred speech  1. What are your last 5 BP readings? 182/122 before bp med   2. Are you having any other symptoms (ex. Dizziness, headache, blurred vision, passed out)? Headache and dizziness   3. What is your BP issue? Pt states she is having bp issues and need to speak with RN about it .Marland Kitchen   Pt c/o medication issue:  1. Name of Medication :losartan    3. Are you having a reaction (difficulty breathing--STAT)? Pt states she think the med is causing her an allergic reaction..   4. What is your medication issue? Pt needs to speak with RN about meds causing reaction . Please call back to discuss

## 2015-09-03 ENCOUNTER — Telehealth: Payer: Self-pay | Admitting: Cardiovascular Disease

## 2015-09-03 DIAGNOSIS — I341 Nonrheumatic mitral (valve) prolapse: Secondary | ICD-10-CM

## 2015-09-03 DIAGNOSIS — I480 Paroxysmal atrial fibrillation: Secondary | ICD-10-CM

## 2015-09-03 NOTE — Telephone Encounter (Signed)
Informed patient of referral order placement.

## 2015-09-03 NOTE — Telephone Encounter (Signed)
Returned call to patient. She reports after being given instruction on BP meds yesterday (d/c losartan - last dose 7/24 - restart lisinopril 20mg  7/25), she continued to feel very anxious about her pressures, and eventually went to the [facility] ED at about 8pm (she'd shown up to the urgent care and they directed her to ED due to proximity of last hospital visit)  She reports that she had taken daily meds as directed, her pressure at home had been about 180/110-120 and she was very concerned about the diastolic value. She took 20mg  lisinopril. When she came to ED her BP was 154/90 around 8pm. She reports she didn't get seen "for several hours". She went home and around 10pm took her BP again. Was 174/100. At midnight it was 182/124 and she decided to take another 10mg  lisinopril. She reports when she woke this AM BP was 160/110.  She states she has continued concerns that something is wrong. Wants a referral to a Dr. Hoyt Koch in [place] (at [facility] and [address] [phone]) to follow up though states "nothing against Dr. Sallyanne Kuster or the practice" but patient wanted to seek 2nd opinion.  She is scheduled to see Roderic Palau on Friday and Dr. Rayann Heman on Monday. I suggested to her that waiting on recommendations from this visit might be best - pt voiced acknowledgment but politely insisted I ask Dr. Sallyanne Kuster for the referral. She still plans to see Butch Penny on Friday.  Patient and I also discussed her medications, I asked her not to exceed the recommended dose of the lisinopril or other meds, reminded her that the BP may take 1-2 weeks to adjust to med changes. We discussed anxiolytics that she was recently prescribed. She notes reluctance to use these "because I'm unsure of what these will do to my blood pressure". Advised her to use at physician recommendation.  Reassured her I would communicate her concerns to Dr. Sallyanne Kuster.  Patient voiced understanding and thanks.

## 2015-09-03 NOTE — Telephone Encounter (Signed)
Please call,she said she talked to you several times yesterday.

## 2015-09-03 NOTE — Telephone Encounter (Signed)
I understand. OK for referral to Dr. Hoyt Koch.

## 2015-09-05 ENCOUNTER — Telehealth: Payer: Self-pay | Admitting: Cardiovascular Disease

## 2015-09-05 NOTE — Telephone Encounter (Signed)
Pt wants [name] to call her tomorrow,concerning her referral to Dr Hoyt Koch.

## 2015-09-06 ENCOUNTER — Ambulatory Visit (HOSPITAL_COMMUNITY)
Admission: RE | Admit: 2015-09-06 | Discharge: 2015-09-06 | Disposition: A | Discharge status: Home / Self Care | Payer: Commercial Managed Care - HMO | Source: Ambulatory Visit | Attending: Nurse Practitioner | Admitting: Nurse Practitioner

## 2015-09-06 ENCOUNTER — Inpatient Hospital Stay (HOSPITAL_COMMUNITY): Admit: 2015-09-06 | Payer: Commercial Managed Care - HMO | Admitting: Nurse Practitioner

## 2015-09-06 ENCOUNTER — Encounter (HOSPITAL_COMMUNITY): Payer: Self-pay | Admitting: Nurse Practitioner

## 2015-09-06 VITALS — BP 160/100 | Ht 64.5 in | Wt 140.0 lb

## 2015-09-06 DIAGNOSIS — Z7902 Long term (current) use of antithrombotics/antiplatelets: Secondary | ICD-10-CM | POA: Diagnosis not present

## 2015-09-06 DIAGNOSIS — Z888 Allergy status to other drugs, medicaments and biological substances status: Secondary | ICD-10-CM | POA: Diagnosis not present

## 2015-09-06 DIAGNOSIS — Z950 Presence of cardiac pacemaker: Secondary | ICD-10-CM | POA: Insufficient documentation

## 2015-09-06 DIAGNOSIS — Z79899 Other long term (current) drug therapy: Secondary | ICD-10-CM | POA: Insufficient documentation

## 2015-09-06 DIAGNOSIS — Z8249 Family history of ischemic heart disease and other diseases of the circulatory system: Secondary | ICD-10-CM | POA: Diagnosis not present

## 2015-09-06 DIAGNOSIS — I10 Essential (primary) hypertension: Secondary | ICD-10-CM | POA: Diagnosis not present

## 2015-09-06 DIAGNOSIS — I2510 Atherosclerotic heart disease of native coronary artery without angina pectoris: Secondary | ICD-10-CM | POA: Insufficient documentation

## 2015-09-06 DIAGNOSIS — Z951 Presence of aortocoronary bypass graft: Secondary | ICD-10-CM | POA: Insufficient documentation

## 2015-09-06 DIAGNOSIS — I4892 Unspecified atrial flutter: Secondary | ICD-10-CM | POA: Diagnosis not present

## 2015-09-06 DIAGNOSIS — E785 Hyperlipidemia, unspecified: Secondary | ICD-10-CM | POA: Insufficient documentation

## 2015-09-06 DIAGNOSIS — Z7901 Long term (current) use of anticoagulants: Secondary | ICD-10-CM | POA: Insufficient documentation

## 2015-09-06 DIAGNOSIS — I484 Atypical atrial flutter: Secondary | ICD-10-CM

## 2015-09-06 DIAGNOSIS — Z87891 Personal history of nicotine dependence: Secondary | ICD-10-CM | POA: Insufficient documentation

## 2015-09-06 DIAGNOSIS — F329 Major depressive disorder, single episode, unspecified: Secondary | ICD-10-CM | POA: Diagnosis not present

## 2015-09-06 DIAGNOSIS — I4891 Unspecified atrial fibrillation: Secondary | ICD-10-CM | POA: Diagnosis present

## 2015-09-06 DIAGNOSIS — I252 Old myocardial infarction: Secondary | ICD-10-CM | POA: Diagnosis not present

## 2015-09-06 DIAGNOSIS — Z823 Family history of stroke: Secondary | ICD-10-CM | POA: Diagnosis not present

## 2015-09-06 NOTE — Progress Notes (Signed)
Patient ID: [name], female   DOB: 08-07-1940, 75 y.o.   MRN: [id number]    Primary Care Physician: [name], MD Referring Physician: Juanda Bond triage Cardiologist: Dr. Johnna Acosta is a 75 y.o. female with a h/o CAD,  PAF and flutter, PPM for tachy/brady syndrome in 2013, CAD with bypass in fall 2016, and clipping of LAA, and NSTEMI April of 2017 with stent placement of mid vein graft supplying large distal RCA and proximal native RCA.  She in in the afib clinic today referral f/u from last hospitalization but is also pending an appointment with Dr. Rayann Heman on Monday per her request. She has had several recent ER/ hospital/ office visits following stent placement in April.Marland KitchenHas had issues with hypertension. She also had a successful cardioversion 7/21 but with ERAF. She gives history of having afib at time of pacer in 2013 and reoccurrence at time of bypass surgery for which she was placed on amiodarone x 3 months. For what pt can remember, she tolerated  Amiodarone well and stayed in SR.  Today, EKG shows a flutter and this has been seen on last few EKG's. It is rate controlled. Incidentally, pt says that for the last few days she has taken been taking  xarelto samples and rivaroxaban  prescription both once a day and just caught  that they were the same drug last night. She is also on plavix for stent placement in April. She has not had any bleeding issues. She denies significant caffeine or alcohol use. Smoking  cessation in 2013.   Today, she denies symptoms of palpitations, chest pain, shortness of breath, orthopnea, PND, lower extremity edema, dizziness, presyncope, syncope, or neurologic sequela. The patient is tolerating medications without difficulties and is otherwise without complaint today.   Past Medical History:  Diagnosis Date  . ABDOMINAL PAIN, LOWER 12/31/2009  . ANXIETY 12/31/2009  . Atrial fibrillation with RVR ([place])   . CORONARY ARTERY DISEASE 12/31/2009   . DEPRESSION 12/31/2009  . [place] DISEASE, CERVICAL 12/31/2009  . [place] DISEASE, LUMBAR 12/31/2009  . DIVERTICULITIS, HX OF 12/31/2009  . GLUCOSE INTOLERANCE 12/31/2009  . Heart murmur    "used to say I did" (01/21/2012)  . HYPERLIPIDEMIA 12/31/2009  . HYPERTENSION 12/31/2009  . MENOPAUSE, EARLY 12/31/2009  . MITRAL REGURGITATION 12/31/2009  . MITRAL VALVE PROLAPSE 12/31/2009  . NSTEMI (non-ST elevated myocardial infarction) ([place]) 05/11/2015  . OSTEOARTHRITIS, HAND 12/31/2009  . Pacemaker 01/21/2012   MDT Adapta dual chamber  . PAD (peripheral artery disease) ([place])   . PAD (peripheral artery disease) ([place])   . PAF (paroxysmal atrial fibrillation) ([place]) 01/22/2012  . PVD (peripheral vascular disease), hx stents to bil SFAs 02/2010 01/22/2012  . Sinus node dysfunction ([place]) 01/22/2012  . Symptomatic sinus bradycardia 01/22/2012  . Tachycardia-bradycardia syndrome ([place])    [name] 01/21/2012  . Unspecified urinary incontinence 12/31/2009   Past Surgical History:  Procedure Laterality Date  . AUGMENTATION MAMMAPLASTY  1983  . CARDIAC CATHETERIZATION  2005   "negative" (01/21/2012)  . CARDIAC CATHETERIZATION  09/17/1999   Mild CAD involving proximal portion of LAD. Mitral valve prolapse w/ mitral regurg. No evidence of renal artery stenosis. Recommended readjustment of antihypertensive medications.  Marland Kitchen CARDIAC CATHETERIZATION N/A 01/01/2015   Procedure: Left Heart Cath and Coronary Angiography;  Surgeon: Jettie Booze, MD;  Location: [place] CV LAB;  Service: Cardiovascular;  Laterality: N/A;  . CARDIAC CATHETERIZATION N/A 05/12/2015   Procedure: Left Heart Cath and Coronary Angiography;  Surgeon: Troy Sine, MD;  Location: [place] CV LAB;  Service: Cardiovascular;  Laterality: N/A;  . CARDIAC CATHETERIZATION N/A 05/12/2015   Procedure: Coronary Stent Intervention;  Surgeon: Troy Sine, MD;  Location: [place] CV LAB;  Service: Cardiovascular;  Laterality: N/A;  .  CARDIAC SURGERY     11/20/20116  . CARDIOVERSION N/A 02/01/2015   Procedure: CARDIOVERSION;  Surgeon: Lelon Perla, MD;  Location[facility] END[facility];  Service: Cardiovascular;  Laterality: N/A;  . CARDIOVERSION N/A 08/30/2015   Procedure: CARDIOVERSION;  Surgeon: Sanda Klein, MD;  Location: [place] ENDOSCOPY;  Service: Cardiovascular;  Laterality: N/A;  . CARPAL TUNNEL RELEASE  2008   "right hand/thumb; carpal tunnel repair; got rid of arthritis" (01/21/2012)  . CLIPPING OF ATRIAL APPENDAGE N/A 01/04/2015   Procedure: CLIPPING OF ATRIAL APPENDAGE;  Surgeon: Grace Isaac, MD;  Location: [place];  Service: Open Heart Surgery;  Laterality: N/A;  . CORONARY ARTERY BYPASS GRAFT N/A 01/04/2015   Procedure: CORONARY ARTERY BYPASS GRAFTING (CABG) x 3 using left internal mammory artery and greater saphenous vein right leg harvested endoscopically.;  Surgeon: Grace Isaac, MD;  LIMA-LAD, SVG-RI, SVG-PDA  . FACELIFT, LOWER 2/3  1995   "mini" (01/21/2012)  . INSERT / REPLACE / REMOVE PACEMAKER  01/21/2012   initial placement  . Lower Arterial Examination  10/28/2011   R. SFA stent mild-moderate mixed density plaque with elevated velocities consistent with 50% diameter reduction. L. SFA stent moderate mixed denisty plaque at mid to distal level consistent with 50-69% diameter reduction.  . OOPHORECTOMY  ~1979  . PACEMAKER INSERTION  01/21/2012   Medtronic Adapta, model# ADDRL1, serial# [id number]  . PARTIAL COLECTOMY  2010  . PERIPHERAL ARTERIAL STENT GRAFT  2012; 2012   "LLE; RLE" (01/21/2012)  . PERMANENT PACEMAKER INSERTION N/A 01/21/2012   Procedure: PERMANENT PACEMAKER INSERTION;  Surgeon: Sanda Klein, MD;  Location: [place] CATH LAB;  Service: Cardiovascular;  Laterality: N/A;  . Persantine Myoview Stress Test  02/25/2010   No scintigraphic evidence of inducible myocardial ischemia. No persantine EKG changes. Non-diagnositc for ishcemia. Low-risk, normal scan.  Marland Kitchen POSTERIOR CERVICAL LAMINECTOMY   1985  . TEE WITHOUT CARDIOVERSION N/A 01/04/2015   Procedure: TRANSESOPHAGEAL ECHOCARDIOGRAM (TEE);  Surgeon: Grace Isaac, MD;  Location: [place];  Service: Open Heart Surgery;  Laterality: N/A;  . TEE WITHOUT CARDIOVERSION N/A 02/01/2015   Procedure: TRANSESOPHAGEAL ECHOCARDIOGRAM (TEE);  Surgeon: Lelon Perla, MD;  Location: [facility] ENDOSCOPY;  Service: Cardiovascular;  Laterality: N/A;  . TEE WITHOUT CARDIOVERSION N/A 08/30/2015   Procedure: TRANSESOPHAGEAL ECHOCARDIOGRAM (TEE);  Surgeon: Sanda Klein, MD;  Location: [facility] ENDOSCOPY;  Service: Cardiovascular;  Laterality: N/A;  . VAGINAL HYSTERECTOMY  1975    Current Outpatient Prescriptions  Medication Sig Dispense Refill  . cholecalciferol (VITAMIN D) 1000 UNITS tablet Take 1,000 Units by mouth daily.    . citalopram (CELEXA) 20 MG tablet Take 1 tablet (20 mg total) by mouth daily. 90 tablet 0  . clopidogrel (PLAVIX) 75 MG tablet Take 1 tablet (75 mg total) by mouth daily. 90 tablet 1  . Cyanocobalamin (VITAMIN B-12 PO) Take 1,000 mg by mouth daily.     . furosemide (LASIX) 20 MG tablet Take 1 tablet (20 mg total) by mouth daily. 30 tablet 0  . lisinopril (PRINIVIL,ZESTRIL) 20 MG tablet Take 1 tablet (20 mg total) by mouth daily. 90 tablet 3  . metoprolol (LOPRESSOR) 50 MG tablet Take 50 mg by mouth 2 (two) times daily.    Marland Kitchen  nitroGLYCERIN (NITROSTAT) 0.4 MG SL tablet Place 1 tablet (0.4 mg total) under the tongue every 5 (five) minutes x 3 doses as needed for chest pain. 25 tablet 2  . rivaroxaban (XARELTO) 20 MG TABS tablet Take 20 mg by mouth daily with supper.    . rosuvastatin (CRESTOR) 10 MG tablet Take 1 tablet (10 mg total) by mouth daily. 90 tablet 3  . vitamin E 400 UNIT capsule Take 400 Units by mouth daily.     No current facility-administered medications for this encounter.     Allergies  Allergen Reactions  . Brilinta [Ticagrelor] Shortness Of Breath  . Clonidine Derivatives Other (See Comments)    Lowers heart rate    . Atorvastatin     Possible cause of fatigue/malaise  . Exforge [Amlodipine Besylate-Valsartan] Itching and Rash    Social History   Social History  . Marital status: Widowed    Spouse name: N/A  . Number of children: 3  . Years of education: N/A   Occupational History  . retired Chemical engineer Retired   Social History Main Topics  . Smoking status: Former Smoker    Packs/day: 0.75    Years: 10.00    Types: Cigarettes  . Smokeless tobacco: Never Used     Comment: 01/21/2012 "quit smoking ~ 2002"  . Alcohol use Yes     Comment: 01/21/2012 "3 times/wk I have a couple mixed drinks"; 06/28/2015 "nothing in the last 3 weeks cause I haven't felt good"  . Drug use: No  . Sexual activity: Not Currently   Other Topics Concern  . Not on file   Social History Narrative  . No narrative on file    Family History  Problem Relation Age of Onset  . Heart disease Brother   . Hypertension Brother     2 brothers  . Heart disease Mother   . Hypertension Mother   . Stroke Mother   . Stroke Father   . CVA Brother     2 brothers  . Heart attack Brother     2 brothers  . Heart disease Mother   . Heart disease Father     ROS- All systems are reviewed and negative except as per the HPI above  Physical Exam: Vitals:   09/06/15 1124 09/06/15 1130  BP: (!) 176/110 (!) 160/100  Weight: 140 lb (63.5 kg)   Height: 5' 4.5" (1.638 m)     GEN- The patient is well appearing, alert and oriented x 3 today.   Head- normocephalic, atraumatic Eyes-  Sclera clear, conjunctiva pink Ears- hearing intact Oropharynx- clear Neck- supple, no JVP Lymph- no cervical lymphadenopathy Lungs- Clear to ausculation bilaterally, normal work of breathing Heart-Irregular rate and rhythm, no murmurs, rubs or gallops, PMI not laterally displaced GI- soft, NT, ND, + BS Extremities- no clubbing, cyanosis, or edema MS- no significant deformity or atrophy Skin- no rash or lesion Psych- euthymic mood,  full affect Neuro- strength and sensation are intact  EKG- Undetermined rhythm with paced rhythm with a flutter? at 95 bpm qrs int 92 ms, qtc 364 ms Epic records reviewed with hospital visits/ ER VISITS and Dr. Lurline Del office notes, LABS, EKG's Echo- 4/17-Study Conclusions  - Left ventricle: The cavity size was normal. Systolic function was   normal. The estimated ejection fraction was in the range of 55%   to 60%. Wall motion was normal; there were no regional wall   motion abnormalities. Features are consistent with a pseudonormal   left  ventricular filling pattern, with concomitant abnormal   relaxation and increased filling pressure (grade 2 diastolic   dysfunction). - Aortic valve: Trileaflet; mildly thickened, mildly calcified   leaflets. There was moderate regurgitation. - Aorta: Ascending aortic diameter: 40 mm (S). - Ascending aorta: The ascending aorta was mildly dilated. - Mitral valve: There was mild regurgitation. - Left atrium: The atrium was mildly dilated. 35 mm - Right ventricle: The cavity size was mildly dilated. Wall   thickness was normal. - Right atrium: The atrium was mildly dilated. - Pulmonary arteries: PA peak pressure: 39 mm Hg (S).  Impressions:  - The right ventricular systolic pressure was increased consistent   with mild pulmonary hypertension.  Labs 09/02/15- Creat 7/24 0.90, Kt 4.0, HGB 11.0, HCT 34.7   ASSESSMENT/PLAN: 1. H/O afib/flutter Controlled for now Continue with metoprolol 50 mg bid Chadsvasc score of  at least 5, since pt has taken double xarelto x last 3 days, do not take any today and resume tomorrow night. No bleeding issues. CBC to be drawn with Dr. Rayann Heman visit on Monday.  If bleeding concerns over the weekend  arise, report to the ER Discussed afib/flutter with pt, triggers and treatment. Ablation discussed but informed pt that she usually would need to fail AAD therapy before ablation pursed. Did well with amiodarone last fall s/p  bypass surgery Further treatment per discussion with Dr. Rayann Heman on Monday  2. CAD Currently no chest pain Continue plavix Continue Crestor  3. HTN Poorly controlled, Dr. Loletha Grayer aware and meds have been recently adjusted Rechecked 160/100 Avoid salt  4. PPM Per Dr. Geroge Baseman C. Kinsie Belford, [facility] [address] [place], [place] [address] [phone]

## 2015-09-06 NOTE — Telephone Encounter (Signed)
Referral order placed Tuesday, and awaiting return call from scheduler.

## 2015-09-09 ENCOUNTER — Encounter: Payer: Self-pay | Admitting: Internal Medicine

## 2015-09-09 ENCOUNTER — Ambulatory Visit (INDEPENDENT_AMBULATORY_CARE_PROVIDER_SITE_OTHER): Payer: Commercial Managed Care - HMO | Admitting: Internal Medicine

## 2015-09-09 VITALS — BP 172/88 | HR 113 | Ht 64.5 in | Wt 139.6 lb

## 2015-09-09 DIAGNOSIS — I484 Atypical atrial flutter: Secondary | ICD-10-CM | POA: Diagnosis not present

## 2015-09-09 DIAGNOSIS — R001 Bradycardia, unspecified: Secondary | ICD-10-CM

## 2015-09-09 DIAGNOSIS — I480 Paroxysmal atrial fibrillation: Secondary | ICD-10-CM | POA: Diagnosis not present

## 2015-09-09 LAB — CUP PACEART INCLINIC DEVICE CHECK
Battery Impedance: 253 Ohm
Battery Remaining Longevity: 110 mo
Battery Voltage: 2.78 V
Brady Statistic AP VP Percent: 0 %
Brady Statistic AP VS Percent: 34 %
Brady Statistic AS VP Percent: 4 %
Brady Statistic AS VS Percent: 61 %
Date Time Interrogation Session: 20170731103744
Implantable Lead Implant Date: 20131212
Implantable Lead Implant Date: 20131212
Implantable Lead Location: 753859
Implantable Lead Location: 753860
Implantable Lead Model: 5076
Implantable Lead Model: 5076
Lead Channel Impedance Value: 355 Ohm
Lead Channel Impedance Value: 410 Ohm
Lead Channel Pacing Threshold Amplitude: 0.75 V
Lead Channel Pacing Threshold Amplitude: 0.75 V
Lead Channel Pacing Threshold Amplitude: 1 V
Lead Channel Pacing Threshold Pulse Width: 0.4 ms
Lead Channel Pacing Threshold Pulse Width: 0.4 ms
Lead Channel Pacing Threshold Pulse Width: 0.4 ms
Lead Channel Sensing Intrinsic Amplitude: 0.5 mV
Lead Channel Sensing Intrinsic Amplitude: 11.2 mV
Lead Channel Setting Pacing Amplitude: 2 V
Lead Channel Setting Pacing Amplitude: 2.5 V
Lead Channel Setting Pacing Pulse Width: 0.4 ms
Lead Channel Setting Sensing Sensitivity: 4 mV

## 2015-09-09 MED ORDER — SPIRONOLACTONE 25 MG PO TABS: 25.0000 mg | ORAL_TABLET | Freq: Every day | ORAL | 3 refills | Status: DC

## 2015-09-09 MED ORDER — METOPROLOL TARTRATE 50 MG PO TABS: 75.0000 mg | ORAL_TABLET | Freq: Two times a day (BID) | ORAL | 3 refills | Status: DC

## 2015-09-09 MED ORDER — AMIODARONE HCL 200 MG PO TABS: 200.0000 mg | ORAL_TABLET | Freq: Two times a day (BID) | ORAL | 3 refills | Status: DC

## 2015-09-09 NOTE — Patient Instructions (Addendum)
Medication Instructions:  Your physician has recommended you make the following change in your medication: 1) Start Spironolactone 25 mg daily 2) Start Amiodarone 200 mg twice daily 3) Increase Metoprolol to 75 mg twice daily    Labwork: Your physician recommends that you return for lab work in one week BMP/TSH/LIVER    Testing/Procedures: None ordered   Follow-Up: Your physician recommends that you schedule a follow-up appointment as scheduled with Dr Rayann Heman  Any Other Special Instructions Will Be Listed Below (If Applicable).     If you need a refill on your cardiac medications before your next appointment, please call your pharmacy.

## 2015-09-09 NOTE — Progress Notes (Signed)
Electrophysiology Office Note   Date:  09/09/2015   ID:  [name], DOB 08-29-1940, MRN [id number]  PCP:  [name], MD  Cardiologist:  Dr Erick Alley Primary Electrophysiologist: Thompson Grayer, MD    Chief Complaint  Patient presents with  . Atrial Fibrillation     History of Present Illness: [name] is a 75 y.o. female who presents today for electrophysiology evaluation.   The patient reports having atrial fibrillation for at least 10 years.  She had several hospitalizations for this.  She reports having amiodarone after bypass surgery.  She stopped this after several months.  She has since developed typical appearing atrial flutter.  She underwent recent cardioversion but has returned to atrial flutter.  Recent TEE revealed preserved EF, mild AI, moderate to severe TR. She reports that the quality of her life has been poor for the last year.  She seems to be having difficulty with SOB and orthopnea, over the past month which may be correlating to her atrial flutter.  Today, she denies symptoms of palpitations, chest pain, orthopnea, PND, lower extremity edema, claudication, dizziness, presyncope, syncope, bleeding, or neurologic sequela. The patient is tolerating medications without difficulties and is otherwise without complaint today.    Past Medical History:  Diagnosis Date  . ABDOMINAL PAIN, LOWER 12/31/2009  . ANXIETY 12/31/2009  . Aortic insufficiency   . CORONARY ARTERY DISEASE 12/31/2009  . DEPRESSION 12/31/2009  . [place] DISEASE, CERVICAL 12/31/2009  . [place] DISEASE, LUMBAR 12/31/2009  . DIVERTICULITIS, HX OF 12/31/2009  . GLUCOSE INTOLERANCE 12/31/2009  . HYPERLIPIDEMIA 12/31/2009  . HYPERTENSION 12/31/2009  . MENOPAUSE, EARLY 12/31/2009  . MITRAL REGURGITATION 12/31/2009  . MITRAL VALVE PROLAPSE 12/31/2009  . NSTEMI (non-ST elevated myocardial infarction) ([place]) 05/11/2015  . OSTEOARTHRITIS, HAND 12/31/2009  . Pacemaker 01/21/2012   MDT Adapta  dual chamber  . Persistent atrial fibrillation ([place]) 01/22/2012  . PVD (peripheral vascular disease), hx stents to bil SFAs 02/2010 01/22/2012  . Symptomatic sinus bradycardia 01/22/2012  . Tricuspid regurgitation   . Unspecified urinary incontinence 12/31/2009   Past Surgical History:  Procedure Laterality Date  . AUGMENTATION MAMMAPLASTY  1983  . CARDIAC CATHETERIZATION  2005   "negative" (01/21/2012)  . CARDIAC CATHETERIZATION  09/17/1999   Mild CAD involving proximal portion of LAD. Mitral valve prolapse w/ mitral regurg. No evidence of renal artery stenosis. Recommended readjustment of antihypertensive medications.  Marland Kitchen CARDIAC CATHETERIZATION N/A 01/01/2015   Procedure: Left Heart Cath and Coronary Angiography;  Surgeon: Jettie Booze, MD;  Location: [place] CV LAB;  Service: Cardiovascular;  Laterality: N/A;  . CARDIAC CATHETERIZATION N/A 05/12/2015   Procedure: Left Heart Cath and Coronary Angiography;  Surgeon: Troy Sine, MD;  Location: [place] CV LAB;  Service: Cardiovascular;  Laterality: N/A;  . CARDIAC CATHETERIZATION N/A 05/12/2015   Procedure: Coronary Stent Intervention;  Surgeon: Troy Sine, MD;  Location: [place] CV LAB;  Service: Cardiovascular;  Laterality: N/A;  . CARDIAC SURGERY     11/20/20116  . CARDIOVERSION N/A 02/01/2015   Procedure: CARDIOVERSION;  Surgeon: Lelon Perla, MD;  Location: [facility] ENDOSCOPY;  Service: Cardiovascular;  Laterality: N/A;  . CARDIOVERSION N/A 08/30/2015   Procedure: CARDIOVERSION;  Surgeon: Sanda Klein, MD;  Location: [place] ENDOSCOPY;  Service: Cardiovascular;  Laterality: N/A;  . CARPAL TUNNEL RELEASE  2008   "right hand/thumb; carpal tunnel repair; got rid of arthritis" (01/21/2012)  . CLIPPING OF ATRIAL APPENDAGE N/A 01/04/2015   Procedure: CLIPPING OF ATRIAL APPENDAGE;  Surgeon: Grace Isaac, MD;  Location: [place];  Service: Open Heart Surgery;  Laterality: N/A;  . CORONARY ARTERY BYPASS GRAFT N/A 01/04/2015     Procedure: CORONARY ARTERY BYPASS GRAFTING (CABG) x 3 using left internal mammory artery and greater saphenous vein right leg harvested endoscopically.;  Surgeon: Grace Isaac, MD;  LIMA-LAD, SVG-RI, SVG-PDA  . FACELIFT, LOWER 2/3  1995   "mini" (01/21/2012)  . Lower Arterial Examination  10/28/2011   R. SFA stent mild-moderate mixed density plaque with elevated velocities consistent with 50% diameter reduction. L. SFA stent moderate mixed denisty plaque at mid to distal level consistent with 50-69% diameter reduction.  . OOPHORECTOMY  ~1979  . PACEMAKER INSERTION  01/21/2012   Medtronic Adapta, model# ADDRL1, serial# [id number]  . PARTIAL COLECTOMY  2010  . PERIPHERAL ARTERIAL STENT GRAFT  2012; 2012   "LLE; RLE" (01/21/2012)  . PERMANENT PACEMAKER INSERTION N/A 01/21/2012   Medtronic Adapta L implanted by Dr Sallyanne Kuster for tachy/brady syndrome  . Persantine Myoview Stress Test  02/25/2010   No scintigraphic evidence of inducible myocardial ischemia. No persantine EKG changes. Non-diagnositc for ishcemia. Low-risk, normal scan.  Marland Kitchen POSTERIOR CERVICAL LAMINECTOMY  1985  . TEE WITHOUT CARDIOVERSION N/A 01/04/2015   Procedure: TRANSESOPHAGEAL ECHOCARDIOGRAM (TEE);  Surgeon: Grace Isaac, MD;  Location: [place];  Service: Open Heart Surgery;  Laterality: N/A;  . TEE WITHOUT CARDIOVERSION N/A 02/01/2015   Procedure: TRANSESOPHAGEAL ECHOCARDIOGRAM (TEE);  Surgeon: Lelon Perla, MD;  Location: [facility] ENDOSCOPY;  Service: Cardiovascular;  Laterality: N/A;  . TEE WITHOUT CARDIOVERSION N/A 08/30/2015   Procedure: TRANSESOPHAGEAL ECHOCARDIOGRAM (TEE);  Surgeon: Sanda Klein, MD;  Location: [facility] ENDOSCOPY;  Service: Cardiovascular;  Laterality: N/A;  . VAGINAL HYSTERECTOMY  1975     Current Outpatient Prescriptions  Medication Sig Dispense Refill  . cholecalciferol (VITAMIN D) 1000 UNITS tablet Take 1,000 Units by mouth daily.    . citalopram (CELEXA) 20 MG tablet Take 1 tablet (20 mg total) by  mouth daily. 90 tablet 0  . clopidogrel (PLAVIX) 75 MG tablet Take 1 tablet (75 mg total) by mouth daily. 90 tablet 1  . Cyanocobalamin (VITAMIN B-12 PO) Take 1,000 mg by mouth daily.     . furosemide (LASIX) 20 MG tablet Take 1 tablet (20 mg total) by mouth daily. 30 tablet 0  . lisinopril (PRINIVIL,ZESTRIL) 20 MG tablet Take 1 tablet (20 mg total) by mouth daily. 90 tablet 3  . metoprolol (LOPRESSOR) 50 MG tablet Take 50 mg by mouth 2 (two) times daily.    . nitroGLYCERIN (NITROSTAT) 0.4 MG SL tablet Place 1 tablet (0.4 mg total) under the tongue every 5 (five) minutes x 3 doses as needed for chest pain. 25 tablet 2  . rivaroxaban (XARELTO) 20 MG TABS tablet Take 20 mg by mouth daily with supper.    . rosuvastatin (CRESTOR) 10 MG tablet Take 1 tablet (10 mg total) by mouth daily. 90 tablet 3  . vitamin E 400 UNIT capsule Take 400 Units by mouth daily.     No current facility-administered medications for this visit.     Allergies:   Brilinta [ticagrelor]; Clonidine derivatives; Atorvastatin; and Exforge [amlodipine besylate-valsartan]   Social History:  The patient  reports that she has quit smoking. Her smoking use included Cigarettes. She has a 7.50 pack-year smoking history. She has never used smokeless tobacco. She reports that she drinks alcohol. She reports that she does not use drugs.   Family History:  The patient's  family  history includes CVA in her brother; Heart attack in her brother; Heart disease in her brother, father, and mother; Hypertension in her brother and mother; Stroke in her father and mother.    ROS:  Please see the history of present illness.   All other systems are reviewed and negative.    PHYSICAL EXAM: VS:  BP (!) 172/88 (BP Location: Left Arm, Patient Position: Sitting, Cuff Size: Normal)   Pulse (!) 113   Ht 5' 4.5" (1.638 m)   Wt 139 lb 9.6 oz (63.3 kg)   BMI 23.59 kg/m  , BMI Body mass index is 23.59 kg/m. GEN: Well nourished, well developed, in no  acute distress  HEENT: normal  Neck: no JVD, carotid bruits, or masses Cardiac: iRRR; no murmurs, rubs, or gallops,no edema  Respiratory:  clear to auscultation bilaterally, normal work of breathing GI: soft, nontender, nondistended, + BS MS: no deformity or atrophy  Skin: warm and dry  Neuro:  Strength and sensation are intact Psych: euthymic mood, full affect  EKG:  EKG 09/06/15 reveals typical appearing atrial flutter PPM interrogation is also reviewed today   Recent Labs: 05/12/2015: TSH 5.390 08/26/2015: B Natriuretic Peptide 647.2; Magnesium 2.1 09/02/2015: ALT 29; BUN 13; Creatinine, Ser 0.90; Hemoglobin 11.0; Platelets 265; Potassium 4.0; Sodium 138    Lipid Panel     Component Value Date/Time   CHOL 158 05/12/2015 0324   TRIG 114 05/12/2015 0324   HDL 64 05/12/2015 0324   CHOLHDL 2.5 05/12/2015 0324   VLDL 23 05/12/2015 0324   LDLCALC 71 05/12/2015 0324     Wt Readings from Last 3 Encounters:  09/09/15 139 lb 9.6 oz (63.3 kg)  09/06/15 140 lb (63.5 kg)  08/26/15 132 lb (59.9 kg)     Other studies Reviewed: Additional studies/ records that were reviewed today include: hospital records and clinic notes are reviewed,  TEE ane TTE are also reviewed   ASSESSMENT AND PLAN:  1.  Typical appearing atrial flutter/ h/o afib The patient has recently had recurrent typical atrial flutter.  Though she also carries a diagnosis of afib, I have not seen this recently.  Her overall symptoms of feeling poorly precede and do not appear to correlate with her atrial flutter by PPM interrogation.  I am not convinced that she is overly symptomatic with her atrial arrhythmias though she does seem to have increasing symptoms of orthopnea/ volume overload recently.  Therapeutic strategies for her atrial arrhythmias including rate control,  Medicine (tikosyn/ amiodarone) and ablation were discussed in detail with the patient today. Risk, benefits, and alternatives to each were discussed.  She  was previously on amiodarone post operatively and tolerated this well.  She would therefore prefer to resume amiodarone.  She understands the risks of this medicine and will follow closely with Dr Sallyanne Kuster for LFTs/TFTs/ and PFTs.  I will start 200mg  BID.  Return to see me in 2-3 weeks.  Will try to pace terminate atrial flutter at that time. Continue anticoagulation. Increase metoprolol to 75mg  BID for better rate control  2. HTN Markedly elevated BP recently Increase metoprolol as above Will add spironolactone 25mg  daily today bmet in 1 week  3. Sick sinus syndrome Normal pacemaker function See Pace Art report No changes today   Follow-up:  Return to see me in 2 weeks  Current medicines are reviewed at length with the patient today.   The patient does not have concerns regarding her medicines.  The following changes were made today:  none   This is a very complicated patient with a difficult medical issues.  I discussed at length with Dr Sallyanne Kuster today.  We formulated the plan together.  A high level of decision making was required for this encounter.  Army Fossa, MD  09/09/2015 9:53 AM     [address] [place] [address] [phone] (office) [phone] (fax)

## 2015-09-13 ENCOUNTER — Encounter: Payer: Self-pay | Admitting: Cardiovascular Disease

## 2015-09-16 ENCOUNTER — Other Ambulatory Visit (INDEPENDENT_AMBULATORY_CARE_PROVIDER_SITE_OTHER): Payer: Commercial Managed Care - HMO

## 2015-09-16 ENCOUNTER — Telehealth: Payer: Self-pay | Admitting: Cardiovascular Disease

## 2015-09-16 DIAGNOSIS — I480 Paroxysmal atrial fibrillation: Secondary | ICD-10-CM

## 2015-09-16 MED ORDER — FUROSEMIDE 20 MG PO TABS: 20.0000 mg | ORAL_TABLET | Freq: Every day | ORAL | 3 refills | Status: DC

## 2015-09-16 NOTE — Telephone Encounter (Signed)
New message   *STAT* If patient is at the pharmacy, call can be transferred to refill team.   1. Which medications need to be refilled? (please list name of each medication and dose if known) furosemide 20mg   2. Which pharmacy/location (including street and city if local pharmacy) is medication to be sent to? Pt hung up before I could get further information   3. Do they need a 30 day or 90 day supply? Pt hung up before I could get further information. Please call back to discuss

## 2015-09-16 NOTE — Telephone Encounter (Signed)
Pt's Rx was sent to pt's pharmacy as requested. Confirmation received.  °

## 2015-09-17 LAB — HEPATIC FUNCTION PANEL
ALT: 14 U/L (ref 6–29)
AST: 18 U/L (ref 10–35)
Albumin: 4 g/dL (ref 3.6–5.1)
Alkaline Phosphatase: 138 U/L — ABNORMAL HIGH (ref 33–130)
Bilirubin, Direct: 0.2 mg/dL (ref <=0.2)
Indirect Bilirubin: 0.4 mg/dL (ref 0.2–1.2)
Total Bilirubin: 0.6 mg/dL (ref 0.2–1.2)
Total Protein: 6.9 g/dL (ref 6.1–8.1)

## 2015-09-17 LAB — BASIC METABOLIC PANEL
BUN: 21 mg/dL (ref 7–25)
CO2: 28 mmol/L (ref 20–31)
Calcium: 10.7 mg/dL — ABNORMAL HIGH (ref 8.6–10.4)
Chloride: 101 mmol/L (ref 98–110)
Creat: 0.99 mg/dL — ABNORMAL HIGH (ref 0.60–0.93)
Glucose, Bld: 122 mg/dL — ABNORMAL HIGH (ref 65–99)
Potassium: 4.9 mmol/L (ref 3.5–5.3)
Sodium: 137 mmol/L (ref 135–146)

## 2015-09-17 LAB — TSH: TSH: 5.1 mIU/L — ABNORMAL HIGH

## 2015-09-25 ENCOUNTER — Encounter: Payer: Self-pay | Admitting: Internal Medicine

## 2015-09-25 ENCOUNTER — Ambulatory Visit (INDEPENDENT_AMBULATORY_CARE_PROVIDER_SITE_OTHER): Payer: Commercial Managed Care - HMO | Admitting: Internal Medicine

## 2015-09-25 VITALS — BP 100/72 | HR 90 | Ht 64.5 in | Wt 131.2 lb

## 2015-09-25 DIAGNOSIS — I480 Paroxysmal atrial fibrillation: Secondary | ICD-10-CM | POA: Diagnosis not present

## 2015-09-25 DIAGNOSIS — I4891 Unspecified atrial fibrillation: Secondary | ICD-10-CM

## 2015-09-25 DIAGNOSIS — I495 Sick sinus syndrome: Secondary | ICD-10-CM

## 2015-09-25 DIAGNOSIS — I483 Typical atrial flutter: Secondary | ICD-10-CM

## 2015-09-25 DIAGNOSIS — I10 Essential (primary) hypertension: Secondary | ICD-10-CM

## 2015-09-25 LAB — CUP PACEART INCLINIC DEVICE CHECK
Battery Impedance: 253 Ohm
Battery Remaining Longevity: 109 mo
Battery Voltage: 2.79 V
Brady Statistic AP VP Percent: 4 %
Brady Statistic AP VS Percent: 20 %
Brady Statistic AS VP Percent: 17 %
Brady Statistic AS VS Percent: 60 %
Date Time Interrogation Session: 20170816105720
Implantable Lead Implant Date: 20131212
Implantable Lead Implant Date: 20131212
Implantable Lead Location: 753859
Implantable Lead Location: 753860
Implantable Lead Model: 5076
Implantable Lead Model: 5076
Lead Channel Impedance Value: 393 Ohm
Lead Channel Impedance Value: 437 Ohm
Lead Channel Pacing Threshold Amplitude: 0.5 V
Lead Channel Pacing Threshold Amplitude: 0.75 V
Lead Channel Pacing Threshold Amplitude: 0.875 V
Lead Channel Pacing Threshold Amplitude: 1 V
Lead Channel Pacing Threshold Pulse Width: 0.4 ms
Lead Channel Pacing Threshold Pulse Width: 0.4 ms
Lead Channel Pacing Threshold Pulse Width: 0.4 ms
Lead Channel Pacing Threshold Pulse Width: 0.4 ms
Lead Channel Sensing Intrinsic Amplitude: 11.2 mV
Lead Channel Sensing Intrinsic Amplitude: 2 mV
Lead Channel Setting Pacing Amplitude: 2 V
Lead Channel Setting Pacing Amplitude: 2.5 V
Lead Channel Setting Pacing Pulse Width: 0.4 ms
Lead Channel Setting Sensing Sensitivity: 4 mV

## 2015-09-25 MED ORDER — AMIODARONE HCL 200 MG PO TABS: 200.0000 mg | ORAL_TABLET | Freq: Every day | ORAL | 3 refills | Status: DC

## 2015-09-25 MED ORDER — METOPROLOL TARTRATE 50 MG PO TABS: 50.0000 mg | ORAL_TABLET | Freq: Two times a day (BID) | ORAL | 3 refills | Status: DC

## 2015-09-25 NOTE — Progress Notes (Signed)
Electrophysiology Office Note   Date:  09/25/2015   ID:  [name], DOB 1940-03-18, MRN [id number]  PCP:  [name], MD  Cardiologist:  Dr Erick Alley Primary Electrophysiologist: Thompson Grayer, MD    Chief Complaint  Patient presents with  . Atrial Fibrillation     History of Present Illness: [name] is a 75 y.o. female who presents today for electrophysiology evaluation.   The patient reports having atrial fibrillation for at least 10 years.  She had several hospitalizations for this.  She reports having amiodarone after bypass surgery.  She stopped this after several months.  She has since developed typical appearing atrial flutter.  She underwent recent cardioversion but has returned to atrial flutter.  Recent TEE revealed preserved EF, mild AI, moderate to severe TR. She reports that the quality of her life has been poor for the last year.  She is now in sinus.  SOB may be a little better.  She is more concerned to day about itching for which she is going to see Dermatology.  She had myalgias with crestor and stopped this.  She is tired at times.  Today, she denies symptoms of palpitations, chest pain, orthopnea, PND, lower extremity edema, claudication, dizziness, presyncope, syncope, bleeding, or neurologic sequela. The patient is tolerating medications without difficulties and is otherwise without complaint today.    Past Medical History:  Diagnosis Date  . ABDOMINAL PAIN, LOWER 12/31/2009  . ANXIETY 12/31/2009  . Aortic insufficiency   . CORONARY ARTERY DISEASE 12/31/2009  . DEPRESSION 12/31/2009  . [place] DISEASE, CERVICAL 12/31/2009  . [place] DISEASE, LUMBAR 12/31/2009  . DIVERTICULITIS, HX OF 12/31/2009  . GLUCOSE INTOLERANCE 12/31/2009  . HYPERLIPIDEMIA 12/31/2009  . HYPERTENSION 12/31/2009  . MENOPAUSE, EARLY 12/31/2009  . MITRAL REGURGITATION 12/31/2009  . MITRAL VALVE PROLAPSE 12/31/2009  . NSTEMI (non-ST elevated myocardial infarction) ([place])  05/11/2015  . OSTEOARTHRITIS, HAND 12/31/2009  . Pacemaker 01/21/2012   MDT Adapta dual chamber  . Persistent atrial fibrillation ([place]) 01/22/2012  . PVD (peripheral vascular disease), hx stents to bil SFAs 02/2010 01/22/2012  . Symptomatic sinus bradycardia 01/22/2012  . Tricuspid regurgitation   . Unspecified urinary incontinence 12/31/2009   Past Surgical History:  Procedure Laterality Date  . AUGMENTATION MAMMAPLASTY  1983  . CARDIAC CATHETERIZATION  2005   "negative" (01/21/2012)  . CARDIAC CATHETERIZATION  09/17/1999   Mild CAD involving proximal portion of LAD. Mitral valve prolapse w/ mitral regurg. No evidence of renal artery stenosis. Recommended readjustment of antihypertensive medications.  Marland Kitchen CARDIAC CATHETERIZATION N/A 01/01/2015   Procedure: Left Heart Cath and Coronary Angiography;  Surgeon: Jettie Booze, MD;  Location: [place] [facility];  Service: Cardiovascular;  Laterality: N/A;  . CARDIAC CATHETERIZATION N/A 05/12/2015   Procedure: Left Heart Cath and Coronary Angiography;  Surgeon: Troy Sine, MD;  Location: [place];  Service: Cardiovascular;  Laterality: N/A;  . CARDIAC CATHETERIZATION N/A 05/12/2015   Procedure: Coronary Stent Intervention;  Surgeon: Troy Sine, MD;  Location: [place] [facility];  Service: Cardiovascular;  Laterality: N/A;  . CARDIAC SURGERY     11/20/20116  . CARDIOVERSION N/A 02/01/2015   Procedure: CARDIOVERSION;  Surgeon: Lelon Perla, MD;  Location: [facility] ENDOSCOPY;  Service: Cardiovascular;  Laterality: N/A;  . CARDIOVERSION N/A 08/30/2015   Procedure: CARDIOVERSION;  Surgeon: Sanda Klein, MD;  Location: [place] ENDOSCOPY;  Service: Cardiovascular;  Laterality: N/A;  . CARPAL TUNNEL RELEASE  2008   "right hand/thumb; carpal  tunnel repair; got rid of arthritis" (01/21/2012)  . CLIPPING OF ATRIAL APPENDAGE N/A 01/04/2015   Procedure: CLIPPING OF ATRIAL APPENDAGE;  Surgeon: Grace Isaac, MD;  Location: [place];  Service:  Open Heart Surgery;  Laterality: N/A;  . CORONARY ARTERY BYPASS GRAFT N/A 01/04/2015   Procedure: CORONARY ARTERY BYPASS GRAFTING (CABG) x 3 using left internal mammory artery and greater saphenous vein right leg harvested endoscopically.;  Surgeon: Grace Isaac, MD;  LIMA-LAD, SVG-RI, SVG-PDA  . FACELIFT, LOWER 2/3  1995   "mini" (01/21/2012)  . Lower Arterial Examination  10/28/2011   R. SFA stent mild-moderate mixed density plaque with elevated velocities consistent with 50% diameter reduction. L. SFA stent moderate mixed denisty plaque at mid to distal level consistent with 50-69% diameter reduction.  . OOPHORECTOMY  ~1979  . PACEMAKER INSERTION  01/21/2012   Medtronic Adapta, model# ADDRL1, serial# [id number]  . PARTIAL COLECTOMY  2010  . PERIPHERAL ARTERIAL STENT GRAFT  2012; 2012   "LLE; RLE" (01/21/2012)  . PERMANENT PACEMAKER INSERTION N/A 01/21/2012   Medtronic Adapta L implanted by Dr Sallyanne Kuster for tachy/brady syndrome  . Persantine Myoview Stress Test  02/25/2010   No scintigraphic evidence of inducible myocardial ischemia. No persantine EKG changes. Non-diagnositc for ishcemia. Low-risk, normal scan.  Marland Kitchen POSTERIOR CERVICAL LAMINECTOMY  1985  . TEE WITHOUT CARDIOVERSION N/A 01/04/2015   Procedure: TRANSESOPHAGEAL ECHOCARDIOGRAM (TEE);  Surgeon: Grace Isaac, MD;  Location: [place];  Service: Open Heart Surgery;  Laterality: N/A;  . TEE WITHOUT CARDIOVERSION N/A 02/01/2015   Procedure: TRANSESOPHAGEAL ECHOCARDIOGRAM (TEE);  Surgeon: Lelon Perla, MD;  Location: [facility] ENDOSCOPY;  Service: Cardiovascular;  Laterality: N/A;  . TEE WITHOUT CARDIOVERSION N/A 08/30/2015   Procedure: TRANSESOPHAGEAL ECHOCARDIOGRAM (TEE);  Surgeon: Sanda Klein, MD;  Location: [facility] ENDOSCOPY;  Service: Cardiovascular;  Laterality: N/A;  . VAGINAL HYSTERECTOMY  1975     Current Outpatient Prescriptions  Medication Sig Dispense Refill  . amiodarone (PACERONE) 200 MG tablet Take 1 tablet (200 mg  total) by mouth 2 (two) times daily. 180 tablet 3  . cholecalciferol (VITAMIN D) 1000 UNITS tablet Take 1,000 Units by mouth daily.    . citalopram (CELEXA) 20 MG tablet Take 1 tablet (20 mg total) by mouth daily. 90 tablet 0  . clopidogrel (PLAVIX) 75 MG tablet Take 1 tablet (75 mg total) by mouth daily. 90 tablet 1  . Cyanocobalamin (VITAMIN B-12 PO) Take 1,000 mg by mouth daily.     . furosemide (LASIX) 20 MG tablet Take 1 tablet (20 mg total) by mouth daily. 90 tablet 3  . hydrOXYzine (ATARAX/VISTARIL) 25 MG tablet Take 1 tablet by mouth daily as needed for itching.    Marland Kitchen lisinopril (PRINIVIL,ZESTRIL) 20 MG tablet Take 1 tablet (20 mg total) by mouth daily. 90 tablet 3  . metoprolol (LOPRESSOR) 50 MG tablet Take 1.5 tablets (75 mg total) by mouth 2 (two) times daily. 270 tablet 3  . nitroGLYCERIN (NITROSTAT) 0.4 MG SL tablet Place 1 tablet (0.4 mg total) under the tongue every 5 (five) minutes x 3 doses as needed for chest pain. 25 tablet 2  . rivaroxaban (XARELTO) 20 MG TABS tablet Take 20 mg by mouth daily with supper.    Marland Kitchen spironolactone (ALDACTONE) 25 MG tablet Take 1 tablet (25 mg total) by mouth daily. 90 tablet 3  . vitamin E 400 UNIT capsule Take 400 Units by mouth daily.     No current facility-administered medications for this visit.  Allergies:   Brilinta [ticagrelor]; Clonidine derivatives; Atorvastatin; Crestor [rosuvastatin calcium]; and Exforge [amlodipine besylate-valsartan]   Social History:  The patient  reports that she has quit smoking. Her smoking use included Cigarettes. She has a 7.50 pack-year smoking history. She has never used smokeless tobacco. She reports that she drinks alcohol. She reports that she does not use drugs.   Family History:  The patient's  family history includes CVA in her brother; Heart attack in her brother; Heart disease in her brother, father, and mother; Hypertension in her brother and mother; Stroke in her father and mother.    ROS:   Please see the history of present illness.   All other systems are reviewed and negative.    PHYSICAL EXAM: VS:  BP 100/72   Pulse 90   Ht 5' 4.5" (1.638 m)   Wt 131 lb 3.2 oz (59.5 kg)   BMI 22.17 kg/m  , BMI Body mass index is 22.17 kg/m. GEN: Well nourished, well developed, in no acute distress  HEENT: normal  Neck: no JVD, carotid bruits, or masses Cardiac: RRR; no murmurs, rubs, or gallops,no edema  Respiratory:  clear to auscultation bilaterally, normal work of breathing GI: soft, nontender, nondistended, + BS MS: no deformity or atrophy  Skin: warm and dry  Neuro:  Strength and sensation are intact Psych: euthymic mood, full affect  EKG:  EKG 09/06/15 reveals atrial paced rhythm, PR 320 msec PPM interrogation is also reviewed today   Recent Labs: 08/26/2015: B Natriuretic Peptide 647.2; Magnesium 2.1 09/02/2015: Hemoglobin 11.0; Platelets 265 09/16/2015: ALT 14; BUN 21; Creat 0.99; Potassium 4.9; Sodium 137; TSH 5.10    Lipid Panel     Component Value Date/Time   CHOL 158 05/12/2015 0324   TRIG 114 05/12/2015 0324   HDL 64 05/12/2015 0324   CHOLHDL 2.5 05/12/2015 0324   VLDL 23 05/12/2015 0324   LDLCALC 71 05/12/2015 0324     Wt Readings from Last 3 Encounters:  09/25/15 131 lb 3.2 oz (59.5 kg)  09/09/15 139 lb 9.6 oz (63.3 kg)  09/06/15 140 lb (63.5 kg)    ASSESSMENT AND PLAN:  1.  Typical appearing atrial flutter/ h/o afib The patient has recently had recurrent typical atrial flutter.  Though she also carries a diagnosis of afib, I have not seen this recently. She was seen by me recently and opted for amiodarone.  She is now maintaining sinus rhythm.  I am not convinced that her quality of life has significant improved. Reduce amiodarone to 200mg  daily Reduce metoprolol back to 50mg  BID Continue anticoagulation. Could consider CTI ablation however with valvular heart disease, I would be surprised if she did not have additional atrial arrhythmias in the  future. Presently, she does not wish to pursue ablation. Dr Sallyanne Kuster to follow LFts/TFTs.  Would consider reducing amiodarone to 100mg  daily in a few months if arrhythmias remain controlled.  2. HTN Appears improved  3. Sick sinus syndrome Normal pacemaker function See Pace Art report No changes today   Follow-up:  Return to see me prn Follow-up with Dr Sallyanne Kuster as scheduled  Signed, Thompson Grayer, MD  09/25/2015 10:48 AM     [address] [place]  [place] [id number] [phone] (office) [phone] (fax)

## 2015-09-25 NOTE — Patient Instructions (Signed)
Medication Instructions:  Your physician has recommended you make the following change in your medication:  1) Decrease Amiodarone to one tablet once daily 2) Decrease Metoprolol Tartrate to one tablet (50 mg) twice daily   Labwork: None ordered   Testing/Procedures: None ordered   Follow-Up: Your physician wants you to follow-up: AS NEEDED with Dr. Rayann Heman   Any Other Special Instructions Will Be Listed Below (If Applicable).     If you need a refill on your cardiac medications before your next appointment, please call your pharmacy.

## 2015-09-25 NOTE — Progress Notes (Signed)
Thank you, James.

## 2015-10-03 ENCOUNTER — Other Ambulatory Visit: Payer: Self-pay | Admitting: Cardiovascular Disease

## 2015-10-03 ENCOUNTER — Other Ambulatory Visit: Payer: Self-pay | Admitting: Cardiology

## 2015-10-07 ENCOUNTER — Other Ambulatory Visit: Payer: Self-pay | Admitting: Cardiovascular Disease

## 2015-10-07 DIAGNOSIS — I10 Essential (primary) hypertension: Secondary | ICD-10-CM

## 2015-10-10 ENCOUNTER — Telehealth: Payer: Self-pay | Admitting: Cardiovascular Disease

## 2015-10-10 NOTE — Telephone Encounter (Signed)
New message  ° ° °Patient calling the office for samples of medication: ° ° °1.  What medication and dosage are you requesting samples for? xarelto  20 mg  ° °2.  Are you currently out of this medication? Yes  ° ° °

## 2015-10-15 NOTE — Telephone Encounter (Signed)
Medication samples have been provided to the patient.  Drug name: xarelto 20mg   Qty: 14 tablets   LOT: 16MG [id number]  Exp.Date: 09/19  Samples left at front desk for patient pick-up. Patient notified.  Patient states she was out of the medication as of 10/11/15 Apologized for delay in getting back to patient regarding sample request Advised patient that if she is relying on samples and getting low, to give our office at least a week's notice prior to when she will run out.   Sheral Apley M 12:13 PM 10/15/2015

## 2015-10-16 ENCOUNTER — Telehealth: Payer: Self-pay | Admitting: Cardiovascular Disease

## 2015-10-16 NOTE — Telephone Encounter (Signed)
Received records from [facility] for appointment on 10/17/15 with Dr Sallyanne Kuster.  Records given to [facility] (medical records) for Dr Croitoru's schedule on 10/17/15. lp

## 2015-10-17 ENCOUNTER — Encounter: Payer: Self-pay | Admitting: Cardiovascular Disease

## 2015-10-17 ENCOUNTER — Ambulatory Visit (INDEPENDENT_AMBULATORY_CARE_PROVIDER_SITE_OTHER): Payer: Commercial Managed Care - HMO | Admitting: Cardiovascular Disease

## 2015-10-17 VITALS — BP 110/70 | HR 88 | Ht 64.5 in | Wt 130.0 lb

## 2015-10-17 DIAGNOSIS — E785 Hyperlipidemia, unspecified: Secondary | ICD-10-CM

## 2015-10-17 DIAGNOSIS — I10 Essential (primary) hypertension: Secondary | ICD-10-CM

## 2015-10-17 DIAGNOSIS — I495 Sick sinus syndrome: Secondary | ICD-10-CM | POA: Diagnosis not present

## 2015-10-17 DIAGNOSIS — I480 Paroxysmal atrial fibrillation: Secondary | ICD-10-CM | POA: Diagnosis not present

## 2015-10-17 DIAGNOSIS — I5032 Chronic diastolic (congestive) heart failure: Secondary | ICD-10-CM

## 2015-10-17 DIAGNOSIS — E875 Hyperkalemia: Secondary | ICD-10-CM | POA: Diagnosis not present

## 2015-10-17 DIAGNOSIS — I739 Peripheral vascular disease, unspecified: Secondary | ICD-10-CM

## 2015-10-17 DIAGNOSIS — Z79899 Other long term (current) drug therapy: Secondary | ICD-10-CM | POA: Diagnosis not present

## 2015-10-17 DIAGNOSIS — Z950 Presence of cardiac pacemaker: Secondary | ICD-10-CM

## 2015-10-17 DIAGNOSIS — I25708 Atherosclerosis of coronary artery bypass graft(s), unspecified, with other forms of angina pectoris: Secondary | ICD-10-CM

## 2015-10-17 MED ORDER — RIVAROXABAN 20 MG PO TABS: 20.0000 mg | ORAL_TABLET | Freq: Every day | ORAL | 11 refills | Status: DC

## 2015-10-17 MED ORDER — AMIODARONE HCL 100 MG PO TABS: 100.0000 mg | ORAL_TABLET | Freq: Every day | ORAL | 11 refills | Status: DC

## 2015-10-17 NOTE — Progress Notes (Signed)
Patient ID: [name], female   DOB: 04-Nov-1940, 75 y.o.   MRN: [id number]     Cardiology Office Note    Date:  10/17/2015   ID:  [name] SHACKETT, DOB [birth year], MRN [id number]  PCP:  [name], MD  Cardiologist:   Sanda Klein, MD   Chief Complaint  Patient presents with  . Follow-up    pt c/o sob and dizziness    History of Present Illness:  [name] is a 75 y.o. female with coronary artery disease and recent bypass surgery, Symptomatic paroxysmal atrial fibrillation  She saw Dr. Rayann Heman on July 31 and started treatment with amiodarone. She felt rather poorly on the loading dose, with nausea and weakness, but feels better on the lower maintenance dose of 200 mg daily. However she continues to have neuropathic-like numbness and discomfort in both feet and in her right hand. She connects these temporally with initiation of amiodarone, although she took this medication for postop atrial fib without problems.  At that same appointment her metoprolol was increased for better rate control and spironolactone was added for markedly elevated blood pressure.  Labs performed on August 25 by her primary care doctor showed elevation in BUN and creatinine (38/1.46) as well as hypercalcemia (11.6) and hyperkalemia (5.8). I think her furosemide was stopped at that point, but she is still taking spironolactone.. Repeat labs performed on September 5 showed improvement in renal function with a creatinine of 1.05 and BUN of 23. However the potassium was even more elevated at 6.3. She had the same calcium level and ionized calcium was also elevated at 6.0. Intact PTH was elevated at 83. There were borderline abnormalities in thyroid function with a slightly elevated TSH of 4.88 but also a slightly elevated free T4 of 1.13.  Her blood pressure is rather low and she describes symptoms of orthostatic hypotension  at 130 pounds. She recently had problems with renal insufficiency and had to stop her  furosemide. She had diastolic heart failure when she weighed over 140 pounds. Probably "dry weight" is around 135 pounds.  Pacemaker interrogation today shows normal device function. Her Medtronic Adapta dual-chamber device was implanted in 2013 and has another 8 years of estimated longevity. Lead parameters are all within normal range. She has 99.8% atrial pacing and only 0.2% ventricular pacing. She has not had any atrial fibrillation since starting amiodarone.  She has normal left ventricular systolic function. She does have evidence of grade 2 diastolic dysfunction on previous echo. She takes xarelto for paroxysmal atrial fibrillation. At the time of her bypass surgery she received an Atricure left atrial appendage clip. She is also on clopidogrel following her drug-eluting stents in April 2017. The pacemaker was implanted in 2013 for symptomatic sinus bradycardia. She also has a history of peripheral arterial disease and received bilateral superficial femoral artery stents about 3 years ago. She has never smoked but drinks daily. She has treated hypertension.  After undergoing bypass surgery in November 2016 she returned with non-ST segment elevation myocardial infarction in April 2017 and required placement of stents both in the native right coronary artery (3.020 mm Synergy DES) and in the saphenous vein graft to the distal right coronary artery (2.7538 mm Synergy DES) due to early graft dysfunction. The saphenous vein graft to the ramus intermedius was also occluded, but this vessel was left for medical therapy.  On May 18 she was hospitalized with fever, cough and severe dyspnea and hypoxemia. No lung infiltrates were seen  and she was diagnosed with bronchitis, treated with antibiotics. She stopped taking her statin and may due to leg weakness which she attributed to this medication. Did not tolerate Brilinta due to breathlessness  Past Medical History:  Diagnosis Date  . ABDOMINAL PAIN, LOWER  12/31/2009  . ANXIETY 12/31/2009  . Aortic insufficiency   . CORONARY ARTERY DISEASE 12/31/2009  . DEPRESSION 12/31/2009  . [place] DISEASE, CERVICAL 12/31/2009  . [place] DISEASE, LUMBAR 12/31/2009  . DIVERTICULITIS, HX OF 12/31/2009  . GLUCOSE INTOLERANCE 12/31/2009  . HYPERLIPIDEMIA 12/31/2009  . HYPERTENSION 12/31/2009  . MENOPAUSE, EARLY 12/31/2009  . MITRAL REGURGITATION 12/31/2009  . MITRAL VALVE PROLAPSE 12/31/2009  . NSTEMI (non-ST elevated myocardial infarction) ([place]) 05/11/2015  . OSTEOARTHRITIS, HAND 12/31/2009  . Pacemaker 01/21/2012   MDT Adapta dual chamber  . Persistent atrial fibrillation ([place]) 01/22/2012  . PVD (peripheral vascular disease), hx stents to bil SFAs 02/2010 01/22/2012  . Symptomatic sinus bradycardia 01/22/2012  . Tricuspid regurgitation   . Unspecified urinary incontinence 12/31/2009    Past Surgical History:  Procedure Laterality Date  . AUGMENTATION MAMMAPLASTY  1983  . CARDIAC CATHETERIZATION  2005   "negative" (01/21/2012)  . CARDIAC CATHETERIZATION  09/17/1999   Mild CAD involving proximal portion of LAD. Mitral valve prolapse w/ mitral regurg. No evidence of renal artery stenosis. Recommended readjustment of antihypertensive medications.  Marland Kitchen CARDIAC CATHETERIZATION N/A 01/01/2015   Procedure: Left Heart Cath and Coronary Angiography;  Surgeon: Jettie Booze, MD;  Location: [place];  Service: Cardiovascular;  Laterality: N/A;  . CARDIAC CATHETERIZATION N/A 05/12/2015   Procedure: Left Heart Cath and Coronary Angiography;  Surgeon: Troy Sine, MD;  Location: [facility];  Service: Cardiovascular;  Laterality: N/A;  . CARDIAC CATHETERIZATION N/A 05/12/2015   Procedure: Coronary Stent Intervention;  Surgeon: Troy Sine, MD;  Location: [facility];  Service: Cardiovascular;  Laterality: N/A;  . CARDIAC SURGERY     11/20/20116  . CARDIOVERSION N/A 02/01/2015   Procedure: CARDIOVERSION;  Surgeon: Lelon Perla, MD;   Location: [facility];  Service: Cardiovascular;  Laterality: N/A;  . CARDIOVERSION N/A 08/30/2015   Procedure: CARDIOVERSION;  Surgeon: Sanda Klein, MD;  Location: [place];  Service: Cardiovascular;  Laterality: N/A;  . CARPAL TUNNEL RELEASE  2008   "right hand/thumb; carpal tunnel repair; got rid of arthritis" (01/21/2012)  . CLIPPING OF ATRIAL APPENDAGE N/A 01/04/2015   Procedure: CLIPPING OF ATRIAL APPENDAGE;  Surgeon: Grace Isaac, MD;  Location: [facility];  Service: Open Heart Surgery;  Laterality: N/A;  . CORONARY ARTERY BYPASS GRAFT N/A 01/04/2015   Procedure: CORONARY ARTERY BYPASS GRAFTING (CABG) x 3 using left internal mammory artery and greater saphenous vein right leg harvested endoscopically.;  Surgeon: Grace Isaac, MD;  LIMA-LAD, SVG-RI, SVG-PDA  . FACELIFT, LOWER 2/3  1995   "mini" (01/21/2012)  . Lower Arterial Examination  10/28/2011   R. SFA stent mild-moderate mixed density plaque with elevated velocities consistent with 50% diameter reduction. L. SFA stent moderate mixed denisty plaque at mid to distal level consistent with 50-69% diameter reduction.  . OOPHORECTOMY  ~1979  . PACEMAKER INSERTION  01/21/2012   Medtronic Adapta, model# ADDRL1, serial# [id number]  . PARTIAL COLECTOMY  2010  . PERIPHERAL ARTERIAL STENT GRAFT  2012; 2012   "LLE; RLE" (01/21/2012)  . PERMANENT PACEMAKER INSERTION N/A 01/21/2012   Medtronic Adapta L implanted by Dr Sallyanne Kuster for tachy/brady syndrome  . Persantine Myoview Stress Test  02/25/2010  No scintigraphic evidence of inducible myocardial ischemia. No persantine EKG changes. Non-diagnositc for ishcemia. Low-risk, normal scan.  Marland Kitchen POSTERIOR CERVICAL LAMINECTOMY  1985  . TEE WITHOUT CARDIOVERSION N/A 01/04/2015   Procedure: TRANSESOPHAGEAL ECHOCARDIOGRAM (TEE);  Surgeon: Grace Isaac, MD;  Location: [place];  Service: Open Heart Surgery;  Laterality: N/A;  . TEE WITHOUT CARDIOVERSION N/A 02/01/2015   Procedure:  TRANSESOPHAGEAL ECHOCARDIOGRAM (TEE);  Surgeon: Lelon Perla, MD;  Location: [facility] ENDOSCOPY;  Service: Cardiovascular;  Laterality: N/A;  . TEE WITHOUT CARDIOVERSION N/A 08/30/2015   Procedure: TRANSESOPHAGEAL ECHOCARDIOGRAM (TEE);  Surgeon: Sanda Klein, MD;  Location: [facility] ENDOSCOPY;  Service: Cardiovascular;  Laterality: N/A;  . VAGINAL HYSTERECTOMY  1975    Current Medications: Outpatient Medications Prior to Visit  Medication Sig Dispense Refill  . cholecalciferol (VITAMIN D) 1000 UNITS tablet Take 1,000 Units by mouth daily.    . citalopram (CELEXA) 20 MG tablet Take 1 tablet (20 mg total) by mouth daily. 90 tablet 0  . clopidogrel (PLAVIX) 75 MG tablet Take 1 tablet (75 mg total) by mouth daily. 90 tablet 1  . Cyanocobalamin (VITAMIN B-12 PO) Take 1,000 mg by mouth daily.     Marland Kitchen lisinopril (PRINIVIL,ZESTRIL) 20 MG tablet Take 1 tablet (20 mg total) by mouth daily. 90 tablet 3  . metoprolol (LOPRESSOR) 50 MG tablet Take 1 tablet (50 mg total) by mouth 2 (two) times daily. 180 tablet 3  . nitroGLYCERIN (NITROSTAT) 0.4 MG SL tablet Place 1 tablet (0.4 mg total) under the tongue every 5 (five) minutes x 3 doses as needed for chest pain. 25 tablet 2  . vitamin E 400 UNIT capsule Take 400 Units by mouth daily.    Marland Kitchen amiodarone (PACERONE) 200 MG tablet Take 1 tablet (200 mg total) by mouth daily. 90 tablet 3  . rivaroxaban (XARELTO) 20 MG TABS tablet Take 20 mg by mouth daily with supper.    Marland Kitchen spironolactone (ALDACTONE) 25 MG tablet Take 1 tablet (25 mg total) by mouth daily. 90 tablet 3  . clopidogrel (PLAVIX) 75 MG tablet TAKE 4 TABLETS(300MG ) FOR 1ST DOSE, THEN TAKE 1 TABLET BY MOUTH DAILY (Patient not taking: Reported on 10/17/2015) 100 tablet 0  . furosemide (LASIX) 20 MG tablet Take 1 tablet (20 mg total) by mouth daily. (Patient not taking: Reported on 10/17/2015) 90 tablet 3  . hydrOXYzine (ATARAX/VISTARIL) 25 MG tablet Take 1 tablet by mouth daily as needed for itching.     No  facility-administered medications prior to visit.      Allergies:   Brilinta [ticagrelor]; Clonidine derivatives; Atorvastatin; Crestor [rosuvastatin calcium]; and Exforge [amlodipine besylate-valsartan]   Social History   Social History  . Marital status: Widowed    Spouse name: N/A  . Number of children: 3  . Years of education: N/A   Occupational History  . retired Chemical engineer Retired   Social History Main Topics  . Smoking status: Former Smoker    Packs/day: 0.75    Years: 10.00    Types: Cigarettes  . Smokeless tobacco: Never Used     Comment: 01/21/2012 "quit smoking ~ 2002"  . Alcohol use Yes     Comment: 01/21/2012 "3 times/wk I have a couple mixed drinks"; 06/28/2015 "nothing in the last 3 weeks cause I haven't felt good", reduced recently  . Drug use: No  . Sexual activity: Not Currently   Other Topics Concern  . None   Social History Narrative   Lives in [place].  Retired.  Family History:  The patient's family history includes CVA in her brother; Heart attack in her brother; Heart disease in her brother, father, and mother; Hypertension in her brother and mother; Stroke in her father and mother.   ROS:   Please see the history of present illness.    ROS All other systems reviewed and are negative.   PHYSICAL EXAM:   VS:  BP 110/70 (BP Location: Right Arm)   Pulse 88   Ht 5' 4.5" (1.638 m)   Wt 130 lb (59 kg)   BMI 21.97 kg/m     Left arm blood pressure was lower at 90/60 mmHg  GEN: Well nourished, well developed, in no acute distress  HEENT: normal  Neck: no JVD, carotid bruits, or masses Cardiac: RRR; no murmurs, rubs, or gallops,no edema  Respiratory:  clear to auscultation bilaterally, normal work of breathing GI: soft, nontender, nondistended, + BS MS: no deformity or atrophy  Skin: warm and dry, no rash Neuro:  Alert and Oriented x 3, Strength and sensation are intact Psych: euthymic mood, full affect  Wt Readings from Last 3  Encounters:  10/17/15 130 lb (59 kg)  09/25/15 131 lb 3.2 oz (59.5 kg)  09/09/15 139 lb 9.6 oz (63.3 kg)      Studies/Labs Reviewed:   EKG:  EKG is ordered today.  The ekg Shows atrial paced, ventricular sensed rhythm. AV delay is around 250 ms. There are nonspecific ST T changes in the lateral leads and the QTC is prolonged at 459 ms.  Recent Labs: 08/26/2015: B Natriuretic Peptide 647.2; Magnesium 2.1 09/02/2015: Hemoglobin 11.0; Platelets 265 09/16/2015: ALT 14; BUN 21; Creat 0.99; Potassium 4.9; Sodium 137; TSH 5.10   Lipid Panel    Component Value Date/Time   CHOL 158 05/12/2015 0324   TRIG 114 05/12/2015 0324   HDL 64 05/12/2015 0324   CHOLHDL 2.5 05/12/2015 0324   VLDL 23 05/12/2015 0324   LDLCALC 71 05/12/2015 0324   10/04/2015  BUN 38 creatinine 1.46, Ca 11.6, K 5.8.  Total cholesterol 244, triglycerides 195, HDL 63, LDL 142 10/15/2015  creatinine 1.05, BUN 23, K 6.3, Ca 11.6, ionized calcium  6.0. Intact PTH 83. TSH of 4.88,  free T4 of 1.13.  ASSESSMENT:    1. Hyperkalemia   2. Chronic diastolic heart failure ([place])   3. Coronary artery disease involving coronary bypass graft of native heart with other forms of angina pectoris ([place])   4. Hyperlipidemia   5. Sinus node dysfunction ([facility])   6. PAF (paroxysmal atrial fibrillation) ([place])   7. S/P placement of cardiac pacemaker   8. Essential hypertension   9. Hypercalcemia   10. PVD (peripheral vascular disease), hx stents to bil SFAs 02/2010   11. Medication management      PLAN:  In order of problems listed above:  1. Hyperkalemia: Potassium levels are quite high and spironolactone should be stopped immediately. Will recheck labs in a few days next week. Fortunately, renal function has returned to completely normal. She has been on ace inhibitors for a long time without problems with potassium levels and these will likely be continued long-term. Instructed to avoid potassium rich foods until levels  normalize. 2. CHF: Seems to be "dry" at 130 pounds and was in heart failure at 145 pounds. We'll shoot for a dry weight of around 135 pounds. 3. CAD: Currently without symptoms of angina pectoris. Unfortunate she had early failure over 2 saphenous vein grafts, one of which was treated with  a drug-eluting stent. Her mammary artery bypass was widely patent. The difference in blood pressure between her upper extremity suggests she may have left subclavian stenosis and will evaluate this with ultrasonography. Preserved left ventricular systolic function.Continue antiplatelet therapy with recently placed drug-eluting stents (clopidogrel at least through April 2018). 4. HLP: Not sure whether her weakness was truly statin related. Current lipid profile shows unacceptably elevated levels of LDL cholesterol. Plan to restart a statin once the rest of her metabolic problems are compensated. 5. SSS: Her pacemaker shows good distribution of heart rate histogram.  6. AFib: Very low burden of arrhythmia. She has an atrial appendage clip and is on Xarelto also. She has neuropathic symptoms that might be related to amiodarone. We'll cut the dose 200 mg daily 7. PPM: Normal device function. Remote download every 3 months and yearly office visits. 8. PAD: She describes fatigue rather than intermittent claudication. Bilateral lower extremity Doppler study showed normal ABIs and waveforms in both extremities. Check upper extremity Dopplers. 9. HTN: Good blood pressure control. Suspect will still be in target range even after spironolactone is discontinued Instructed to have her blood pressure checked in the right upper extremity only. 10. Hypercalcemia: Labs suggest she has primary hyperparathyroidism. The hypercalcemia may explain some of her fatigue. Probably should see an endocrinologist.    Medication Adjustments/Labs and Tests Ordered: Current medicines are reviewed at length with the patient today.  Concerns  regarding medicines are outlined above.  Medication changes, Labs and Tests ordered today are listed in the Patient Instructions below. Patient Instructions  Medication Instructions: Dr Sallyanne Kuster has recommended making the following medication changes: 1. STOP Spironolactone 2. Decrease Amiodarone to 100 mg daily  Labwork: Your physician recommends that you return for lab work on Wednesday, 10/23/15.  Testing/Procedures: NONE ORDERED  Follow-up: Dr Sallyanne Kuster recommends that you schedule a follow-up appointment in 3 months with a pacemaker check.   If you need a refill on your cardiac medications before your next appointment, please call your pharmacy.   Your physician recommends that you weigh, daily, at the same time every day, and in the same amount of clothing. Please record your daily weights. Call the office if your weight is above 135 pounds to speak with a nurse.     Signed, Sanda Klein, MD  10/17/2015 5:03 PM    [facility] [place], [place], [place]  [address] Phone: [phone]; Fax: [phone]

## 2015-10-17 NOTE — Patient Instructions (Addendum)
Medication Instructions: Dr Sallyanne Kuster has recommended making the following medication changes: 1. STOP Spironolactone 2. Decrease Amiodarone to 100 mg daily  Labwork: Your physician recommends that you return for lab work on Wednesday, 10/23/15.  Testing/Procedures: NONE ORDERED  Follow-up: Dr Sallyanne Kuster recommends that you schedule a follow-up appointment in 3 months with a pacemaker check.   If you need a refill on your cardiac medications before your next appointment, please call your pharmacy.   Your physician recommends that you weigh, daily, at the same time every day, and in the same amount of clothing. Please record your daily weights. Call the office if your weight is above 135 pounds to speak with a nurse.

## 2015-10-19 DIAGNOSIS — I5032 Chronic diastolic (congestive) heart failure: Secondary | ICD-10-CM | POA: Insufficient documentation

## 2015-10-22 ENCOUNTER — Telehealth: Payer: Self-pay | Admitting: *Deleted

## 2015-10-22 ENCOUNTER — Ambulatory Visit (HOSPITAL_COMMUNITY)
Admission: RE | Admit: 2015-10-22 | Discharge: 2015-10-22 | Disposition: A | Discharge status: Home / Self Care | Payer: Commercial Managed Care - HMO | Source: Ambulatory Visit | Attending: Cardiovascular Disease | Admitting: Cardiovascular Disease

## 2015-10-22 DIAGNOSIS — Z87891 Personal history of nicotine dependence: Secondary | ICD-10-CM | POA: Diagnosis not present

## 2015-10-22 DIAGNOSIS — Z951 Presence of aortocoronary bypass graft: Secondary | ICD-10-CM | POA: Insufficient documentation

## 2015-10-22 DIAGNOSIS — E785 Hyperlipidemia, unspecified: Secondary | ICD-10-CM | POA: Diagnosis not present

## 2015-10-22 DIAGNOSIS — I739 Peripheral vascular disease, unspecified: Secondary | ICD-10-CM | POA: Insufficient documentation

## 2015-10-22 DIAGNOSIS — I10 Essential (primary) hypertension: Secondary | ICD-10-CM | POA: Diagnosis not present

## 2015-10-22 DIAGNOSIS — I2510 Atherosclerotic heart disease of native coronary artery without angina pectoris: Secondary | ICD-10-CM | POA: Insufficient documentation

## 2015-10-22 LAB — CUP PACEART INCLINIC DEVICE CHECK
Battery Impedance: 277 Ohm
Battery Remaining Longevity: 96 mo
Battery Voltage: 2.79 V
Brady Statistic AP VP Percent: 0.2 %
Brady Statistic AP VS Percent: 99.8 %
Brady Statistic AS VP Percent: 0.1 % — CL
Brady Statistic AS VS Percent: 0.1 % — CL
Date Time Interrogation Session: 20170912091311
Implantable Lead Implant Date: 20131212
Implantable Lead Implant Date: 20131212
Implantable Lead Location: 753859
Implantable Lead Location: 753860
Implantable Lead Model: 5076
Implantable Lead Model: 5076
Lead Channel Impedance Value: 393 Ohm
Lead Channel Impedance Value: 481 Ohm
Lead Channel Pacing Threshold Amplitude: 0.625 V
Lead Channel Pacing Threshold Amplitude: 0.875 V
Lead Channel Pacing Threshold Pulse Width: 0.4 ms
Lead Channel Pacing Threshold Pulse Width: 0.4 ms
Lead Channel Sensing Intrinsic Amplitude: 11.2 mV
Lead Channel Setting Pacing Amplitude: 2 V
Lead Channel Setting Pacing Amplitude: 2.5 V
Lead Channel Setting Pacing Pulse Width: 0.4 ms
Lead Channel Setting Sensing Sensitivity: 4 mV

## 2015-10-22 NOTE — Telephone Encounter (Signed)
Pt arrived to office for testing, requesting samples for Xarelto 20mg .    Medication samples have been provided to the patient.  Drug name: Xarelto 20mg   Qty: 28 tablets LOT: [id number]  Exp.Date: 11/19  Samples left at front desk for patient pick-up.   Ethen Bannan A Shannell Mikkelsen 9:18 AM 10/22/2015

## 2015-10-23 ENCOUNTER — Encounter: Payer: Self-pay | Admitting: Cardiovascular Disease

## 2015-10-23 ENCOUNTER — Telehealth: Payer: Self-pay | Admitting: *Deleted

## 2015-10-23 LAB — BASIC METABOLIC PANEL
BUN: 21 mg/dL (ref 7–25)
CO2: 27 mmol/L (ref 20–31)
Calcium: 10.7 mg/dL — ABNORMAL HIGH (ref 8.6–10.4)
Chloride: 102 mmol/L (ref 98–110)
Creat: 1.03 mg/dL — ABNORMAL HIGH (ref 0.60–0.93)
Glucose, Bld: 93 mg/dL (ref 65–99)
Potassium: 5.4 mmol/L — ABNORMAL HIGH (ref 3.5–5.3)
Sodium: 136 mmol/L (ref 135–146)

## 2015-10-23 NOTE — Telephone Encounter (Signed)
-----   Message from Lorretta Harp, MD sent at 10/22/2015  2:16 PM EDT ----- Essentially normal study. Repeat when clinically indicated.

## 2015-10-23 NOTE — Telephone Encounter (Signed)
Spoke to patient. Result given . Verbalized understanding  

## 2015-10-30 ENCOUNTER — Telehealth: Payer: Self-pay | Admitting: *Deleted

## 2015-10-30 NOTE — Telephone Encounter (Signed)
-----   Message from Sanda Klein, MD sent at 10/23/2015 10:10 AM EDT ----- Potassium level is greatly improved, although still slightly higher than normal range. No longer at a dangerous level. Remain off spironolactone. Kidney function is essentially normal. Calcium remains high, unchanged

## 2015-10-30 NOTE — Telephone Encounter (Signed)
LEFT MESSAGE TO CALL BACK IN REGARDS TO BELOW CONVERSATION  Notes Recorded by Sanda Klein, MD on 10/24/2015 at 1:26 PM EDT The best option for Calcium is to see an endocrinologist. For example Dr. Dorris Fetch in [place] or Dr. Cruzita Lederer in [place]. Plan to check lipids after 3 months back on Crestor, please ------  Notes Recorded by Dionne Bucy Truitt, CMA on 10/24/2015 at 12:10 PM EDT Called patient with results. Patient verbalized understanding and agreed with plan. Patient inquired about how can she lower her calcium? Also asked about her cholesterol - last checked in April. Should she have repeat lipid since restarting Crestor? Please advise. ------

## 2015-11-04 ENCOUNTER — Telehealth: Payer: Self-pay | Admitting: Cardiovascular Disease

## 2015-11-04 MED ORDER — RIVAROXABAN 15 MG PO TABS: 15.0000 mg | ORAL_TABLET | Freq: Every day | ORAL | 5 refills | Status: DC

## 2015-11-04 NOTE — Telephone Encounter (Signed)
Returned call to patient and spoke to her regarding advised changes. She is aware we are moving her to smaller dose. Advised to call if further concerns. I have placed samples at front desk for pickup at her request.

## 2015-11-04 NOTE — Telephone Encounter (Signed)
I am doubtful that it is the medication causing these bumps based on the description; but based on her renal function (CrCl 11mL/min) and indication (afib), she is a candidate for 15mg  daily dosing. We can decrease her dose and see if this helps with the bleeding.

## 2015-11-04 NOTE — Telephone Encounter (Signed)
Returned call. Pt describes small, pinhead size "bumps" that spontaneously appear and bleed. She has 4 on shoulder now. She notes she's had a problem like this in the past that was possibly a reaction to a medication. She notices these a lot when she gets out of the shower. She informs me that since that medication change, this has gotten better, but she's still having the problem. Just reports that the symptoms are less severe. She notes "bleeding a lot for such a small site", wondering if her Xarelto could be changed to lower dose or if other recommendations. Aware that I will route to pharmD to review possible allergic reactions, med changes.

## 2015-11-04 NOTE — Telephone Encounter (Signed)
Records received from [facility] health care for apt on 01/20/16 with Dr Sallyanne Kuster, records given to [employer] (medical records) CN

## 2015-11-04 NOTE — Telephone Encounter (Signed)
Follow up     Pt c/o medication issue:  1. Name of Medication: xarelto  2. How are you currently taking this medication (dosage and times per day)?20mg  1x day  3. Are you having a reaction (difficulty breathing--STAT)? no 4. What is your medication issue? Red spots and they bleed   Pt verbalized that she wants to decrease the dosage

## 2015-11-05 ENCOUNTER — Other Ambulatory Visit: Payer: Self-pay

## 2015-11-05 MED ORDER — LISINOPRIL 20 MG PO TABS: 20.0000 mg | ORAL_TABLET | Freq: Every day | ORAL | 0 refills | Status: DC

## 2015-11-06 ENCOUNTER — Other Ambulatory Visit: Payer: Self-pay

## 2015-12-02 ENCOUNTER — Encounter (HOSPITAL_COMMUNITY): Payer: Self-pay | Admitting: Emergency Medicine

## 2015-12-02 ENCOUNTER — Emergency Department (HOSPITAL_COMMUNITY): Payer: Commercial Managed Care - HMO

## 2015-12-02 ENCOUNTER — Other Ambulatory Visit: Payer: Self-pay | Admitting: Cardiovascular Disease

## 2015-12-02 ENCOUNTER — Other Ambulatory Visit: Payer: Self-pay | Admitting: *Deleted

## 2015-12-02 ENCOUNTER — Observation Stay (HOSPITAL_COMMUNITY)
Admission: EM | Admit: 2015-12-02 | Discharge: 2015-12-04 | Disposition: A | Discharge status: Home / Self Care | Payer: Commercial Managed Care - HMO | Attending: Internal Medicine | Admitting: Internal Medicine

## 2015-12-02 DIAGNOSIS — E785 Hyperlipidemia, unspecified: Secondary | ICD-10-CM | POA: Diagnosis not present

## 2015-12-02 DIAGNOSIS — I5032 Chronic diastolic (congestive) heart failure: Secondary | ICD-10-CM | POA: Diagnosis not present

## 2015-12-02 DIAGNOSIS — I739 Peripheral vascular disease, unspecified: Secondary | ICD-10-CM | POA: Diagnosis not present

## 2015-12-02 DIAGNOSIS — E875 Hyperkalemia: Secondary | ICD-10-CM

## 2015-12-02 DIAGNOSIS — Z79899 Other long term (current) drug therapy: Secondary | ICD-10-CM | POA: Insufficient documentation

## 2015-12-02 DIAGNOSIS — I25118 Atherosclerotic heart disease of native coronary artery with other forms of angina pectoris: Secondary | ICD-10-CM | POA: Diagnosis not present

## 2015-12-02 DIAGNOSIS — I160 Hypertensive urgency: Secondary | ICD-10-CM | POA: Diagnosis not present

## 2015-12-02 DIAGNOSIS — I482 Chronic atrial fibrillation, unspecified: Secondary | ICD-10-CM | POA: Diagnosis present

## 2015-12-02 DIAGNOSIS — M19049 Primary osteoarthritis, unspecified hand: Secondary | ICD-10-CM | POA: Insufficient documentation

## 2015-12-02 DIAGNOSIS — Z9049 Acquired absence of other specified parts of digestive tract: Secondary | ICD-10-CM | POA: Diagnosis not present

## 2015-12-02 DIAGNOSIS — Z951 Presence of aortocoronary bypass graft: Secondary | ICD-10-CM

## 2015-12-02 DIAGNOSIS — R51 Headache: Secondary | ICD-10-CM | POA: Diagnosis not present

## 2015-12-02 DIAGNOSIS — Z955 Presence of coronary angioplasty implant and graft: Secondary | ICD-10-CM | POA: Insufficient documentation

## 2015-12-02 DIAGNOSIS — I110 Hypertensive heart disease with heart failure: Secondary | ICD-10-CM | POA: Insufficient documentation

## 2015-12-02 DIAGNOSIS — I495 Sick sinus syndrome: Secondary | ICD-10-CM | POA: Insufficient documentation

## 2015-12-02 DIAGNOSIS — I4891 Unspecified atrial fibrillation: Secondary | ICD-10-CM

## 2015-12-02 DIAGNOSIS — I480 Paroxysmal atrial fibrillation: Secondary | ICD-10-CM | POA: Diagnosis present

## 2015-12-02 DIAGNOSIS — I481 Persistent atrial fibrillation: Secondary | ICD-10-CM | POA: Insufficient documentation

## 2015-12-02 DIAGNOSIS — Z8249 Family history of ischemic heart disease and other diseases of the circulatory system: Secondary | ICD-10-CM | POA: Diagnosis not present

## 2015-12-02 DIAGNOSIS — R079 Chest pain, unspecified: Principal | ICD-10-CM | POA: Diagnosis present

## 2015-12-02 DIAGNOSIS — I341 Nonrheumatic mitral (valve) prolapse: Secondary | ICD-10-CM | POA: Insufficient documentation

## 2015-12-02 DIAGNOSIS — Z9229 Personal history of other drug therapy: Secondary | ICD-10-CM

## 2015-12-02 DIAGNOSIS — Z7901 Long term (current) use of anticoagulants: Secondary | ICD-10-CM | POA: Insufficient documentation

## 2015-12-02 DIAGNOSIS — I252 Old myocardial infarction: Secondary | ICD-10-CM | POA: Diagnosis not present

## 2015-12-02 DIAGNOSIS — F329 Major depressive disorder, single episode, unspecified: Secondary | ICD-10-CM | POA: Diagnosis not present

## 2015-12-02 DIAGNOSIS — I10 Essential (primary) hypertension: Secondary | ICD-10-CM | POA: Diagnosis present

## 2015-12-02 DIAGNOSIS — I2510 Atherosclerotic heart disease of native coronary artery without angina pectoris: Secondary | ICD-10-CM | POA: Diagnosis present

## 2015-12-02 DIAGNOSIS — I340 Nonrheumatic mitral (valve) insufficiency: Secondary | ICD-10-CM | POA: Diagnosis not present

## 2015-12-02 DIAGNOSIS — R519 Headache, unspecified: Secondary | ICD-10-CM

## 2015-12-02 DIAGNOSIS — Z7902 Long term (current) use of antithrombotics/antiplatelets: Secondary | ICD-10-CM | POA: Insufficient documentation

## 2015-12-02 DIAGNOSIS — Z950 Presence of cardiac pacemaker: Secondary | ICD-10-CM | POA: Diagnosis present

## 2015-12-02 DIAGNOSIS — Z87891 Personal history of nicotine dependence: Secondary | ICD-10-CM | POA: Diagnosis not present

## 2015-12-02 LAB — CBC
HCT: 39 % (ref 36.0–46.0)
Hemoglobin: 12.7 g/dL (ref 12.0–15.0)
MCH: 27.4 pg (ref 26.0–34.0)
MCHC: 32.6 g/dL (ref 30.0–36.0)
MCV: 84.2 fL (ref 78.0–100.0)
Platelets: 248 10*3/uL (ref 150–400)
RBC: 4.63 MIL/uL (ref 3.87–5.11)
RDW: 19.7 % — ABNORMAL HIGH (ref 11.5–15.5)
WBC: 4.3 10*3/uL (ref 4.0–10.5)

## 2015-12-02 LAB — BASIC METABOLIC PANEL
Anion gap: 7 (ref 5–15)
BUN: 16 mg/dL (ref 6–20)
CO2: 29 mmol/L (ref 22–32)
Calcium: 10.9 mg/dL — ABNORMAL HIGH (ref 8.9–10.3)
Chloride: 105 mmol/L (ref 101–111)
Creatinine, Ser: 0.97 mg/dL (ref 0.44–1.00)
GFR calc Af Amer: >60 mL/min (ref >60)
GFR calc non Af Amer: 56 mL/min — ABNORMAL LOW (ref >60)
Glucose, Bld: 110 mg/dL — ABNORMAL HIGH (ref 65–99)
Potassium: 5.7 mmol/L — ABNORMAL HIGH (ref 3.5–5.1)
Sodium: 141 mmol/L (ref 135–145)

## 2015-12-02 LAB — TROPONIN I: Troponin I: <0.03 ng/mL (ref <0.03)

## 2015-12-02 LAB — I-STAT TROPONIN, ED: Troponin i, poc: 0 ng/mL (ref 0.00–0.08)

## 2015-12-02 LAB — BRAIN NATRIURETIC PEPTIDE: B Natriuretic Peptide: 499 pg/mL — ABNORMAL HIGH (ref 0.0–100.0)

## 2015-12-02 MED ORDER — INSULIN ASPART 100 UNIT/ML ~~LOC~~ SOLN
5.0000 [IU] | Freq: Once | SUBCUTANEOUS | Status: AC
  Administered 2015-12-02: 5 [IU] via INTRAVENOUS
  Filled 2015-12-02: qty 1

## 2015-12-02 MED ORDER — DEXTROSE 50 % IV SOLN
1.0000 | Freq: Once | INTRAVENOUS | Status: AC
  Administered 2015-12-02: 50 mL via INTRAVENOUS
  Filled 2015-12-02: qty 50

## 2015-12-02 MED ORDER — CLOPIDOGREL BISULFATE 75 MG PO TABS: 75.0000 mg | ORAL_TABLET | Freq: Every day | ORAL | 0 refills | Status: DC

## 2015-12-02 MED ORDER — SODIUM CHLORIDE 0.9 % IV BOLUS (SEPSIS)
500.0000 mL | Freq: Once | INTRAVENOUS | Status: AC
  Administered 2015-12-02: 500 mL via INTRAVENOUS

## 2015-12-02 MED ORDER — SODIUM POLYSTYRENE SULFONATE 15 GM/60ML PO SUSP
15.0000 g | Freq: Once | ORAL | Status: AC
  Administered 2015-12-02: 15 g via ORAL
  Filled 2015-12-02: qty 60

## 2015-12-02 MED ORDER — LABETALOL HCL 5 MG/ML IV SOLN
10.0000 mg | Freq: Once | INTRAVENOUS | Status: AC
  Administered 2015-12-02: 10 mg via INTRAVENOUS
  Filled 2015-12-02: qty 4

## 2015-12-02 NOTE — ED Notes (Signed)
EDP at bedside  

## 2015-12-02 NOTE — Telephone Encounter (Signed)
Rx has been sent to the pharmacy electronically. ° °

## 2015-12-02 NOTE — ED Provider Notes (Signed)
[facility] DEPT Provider Note   CSN: [id number] Arrival date & time: 12/02/15  1507     History   Chief Complaint Chief Complaint  Patient presents with  . Chest Pain    HPI BERNIECE [name] is a 75 y.o. female who presents with headache, lightheadedness, and chest pain. PMH significant for hx of MI and CAD s/p CABG and stenting, PAF on Xarelto and amiodarone, diastolic CHF EF [id number], s/p dual pacer, sinus node dysfunction, mitral valve regurg and prolapse, PVD, HTN, dyslipidemia, hyperkalemia, hypercalcemia with probable hyperparathyroidism, chronic fatigue. She states that 2 days ago she woke up with a severe frontal headache which lasted for several minutes and went away. She reports associated chest pain around the same time which was on the right side of her chest and also lasted several minutes. The severe headache came back yesterday and again lasted for several minutes and went away. She state it "felt like her head was going to explode". She states she normally does not have headaches and this was the worst headache she's ever had. This morning she reports lightheadedness with positional changes. She also had "chest pressure" across the lower half of her chest which started at 12PM and lasted for 2 hours. This has since resolved. Reports associated SOB. She reports stable daily weights but reports decreased PO intake and increased stress. Her fiancee is 75 years old and has 8 children, 2 of which are teenagers which she helps care for. Cardiologist is Dr. Sallyanne Kuster. Last OV on 9/7. Pacer was checked on 9/7 which was unremarkable. TEE on 7/21 showed EF 50-55%. Last cath was 4/02 which showed significant disease and was stented at that time. Of note in op note it was stated: If the patient continues to experience increasing symptoms of chest pain consider PCI to the ramus intermediate vessel since the graft that supplied this is occluded. She denies fever, chills, vision changes, diaphoresis,  cough, abdominal pain, N/V.  HPI  Past Medical History:  Diagnosis Date  . ABDOMINAL PAIN, LOWER 12/31/2009  . ANXIETY 12/31/2009  . Aortic insufficiency   . CORONARY ARTERY DISEASE 12/31/2009  . DEPRESSION 12/31/2009  . [place] DISEASE, CERVICAL 12/31/2009  . [place] DISEASE, LUMBAR 12/31/2009  . DIVERTICULITIS, HX OF 12/31/2009  . GLUCOSE INTOLERANCE 12/31/2009  . HYPERLIPIDEMIA 12/31/2009  . HYPERTENSION 12/31/2009  . MENOPAUSE, EARLY 12/31/2009  . MITRAL REGURGITATION 12/31/2009  . MITRAL VALVE PROLAPSE 12/31/2009  . NSTEMI (non-ST elevated myocardial infarction) ([place]) 05/11/2015  . OSTEOARTHRITIS, HAND 12/31/2009  . Pacemaker 01/21/2012   MDT Adapta dual chamber  . Persistent atrial fibrillation ([place]) 01/22/2012  . PVD (peripheral vascular disease), hx stents to bil SFAs 02/2010 01/22/2012  . Symptomatic sinus bradycardia 01/22/2012  . Tricuspid regurgitation   . Unspecified urinary incontinence 12/31/2009    Patient Active Problem List   Diagnosis Date Noted  . Chronic diastolic heart failure ([place]) 10/19/2015  . Hypercalcemia 10/19/2015  . Atypical atrial flutter ([place])   . Atrial flutter ([place]) 08/23/2015  . Febrile illness, acute 06/27/2015  . Hypoxemia 06/27/2015  . Chronic fatigue 06/27/2015  . Alcohol abuse, daily use 06/27/2015  . Bronchitis   . Pyrexia   . Stented coronary artery   . Chest pain 05/11/2015  . NSTEMI (non-ST elevated myocardial infarction) ([place]) 05/11/2015  . Atrial fibrillation with RVR ([place]) 01/11/2015  . S/P CABG x 3 01/04/15 01/10/2015  . CAD (coronary artery disease) 01/04/2015  . Accelerating angina ([place]) 01/01/2015  . Unstable angina pectoris ([place]) 12/28/2014  .  Symptomatic sinus bradycardia 01/22/2012  . PAF (paroxysmal atrial fibrillation) ([place]) 01/22/2012  . Sinus node dysfunction ([place]) 01/22/2012  . S/P placement of cardiac pacemaker, medtronic adapta 01/21/12 01/22/2012  . PVD (peripheral vascular disease), hx stents to bil SFAs  02/2010 01/22/2012  . GLUCOSE INTOLERANCE 12/31/2009  . Hyperlipidemia 12/31/2009  . Anxiety 12/31/2009  . DEPRESSION 12/31/2009  . MITRAL REGURGITATION 12/31/2009  . Essential hypertension 12/31/2009  . Coronary atherosclerosis 12/31/2009  . MITRAL VALVE PROLAPSE 12/31/2009  . MENOPAUSE, EARLY 12/31/2009  . OSTEOARTHRITIS, HAND 12/31/2009  . [place] DISEASE, CERVICAL 12/31/2009  . [place] DISEASE, LUMBAR 12/31/2009  . UNSPECIFIED URINARY INCONTINENCE 12/31/2009  . ABDOMINAL PAIN, LOWER 12/31/2009  . DIVERTICULITIS, HX OF 12/31/2009    Past Surgical History:  Procedure Laterality Date  . AUGMENTATION MAMMAPLASTY  1983  . CARDIAC CATHETERIZATION  2005   "negative" (01/21/2012)  . CARDIAC CATHETERIZATION  09/17/1999   Mild CAD involving proximal portion of LAD. Mitral valve prolapse w/ mitral regurg. No evidence of renal artery stenosis. Recommended readjustment of antihypertensive medications.  Marland Kitchen CARDIAC CATHETERIZATION N/A 01/01/2015   Procedure: Left Heart Cath and Coronary Angiography;  Surgeon: Jettie Booze, MD;  Location: [facility];  Service: Cardiovascular;  Laterality: N/A;  . CARDIAC CATHETERIZATION N/A 05/12/2015   Procedure: Left Heart Cath and Coronary Angiography;  Surgeon: Troy Sine, MD;  Location: [facility];  Service: Cardiovascular;  Laterality: N/A;  . CARDIAC CATHETERIZATION N/A 05/12/2015   Procedure: Coronary Stent Intervention;  Surgeon: Troy Sine, MD;  Location: [facility];  Service: Cardiovascular;  Laterality: N/A;  . CARDIAC SURGERY     11/20/20116  . CARDIOVERSION N/A 02/01/2015   Procedure: CARDIOVERSION;  Surgeon: Lelon Perla, MD;  Location[facility] ENDOSCOPY;  Service: Cardiovascular;  Laterality: N/A;  . CARDIOVERSION N/A 08/30/2015   Procedure: CARDIOVERSION;  Surgeon: Sanda Klein, MD;  Location: [facility] ENDOSCOPY;  Service: Cardiovascular;  Laterality: N/A;  . CARPAL TUNNEL RELEASE  2008   "right hand/thumb; carpal tunnel  repair; got rid of arthritis" (01/21/2012)  . CLIPPING OF ATRIAL APPENDAGE N/A 01/04/2015   Procedure: CLIPPING OF ATRIAL APPENDAGE;  Surgeon: Grace Isaac, MD;  Location: [facility];  Service: Open Heart Surgery;  Laterality: N/A;  . CORONARY ARTERY BYPASS GRAFT N/A 01/04/2015   Procedure: CORONARY ARTERY BYPASS GRAFTING (CABG) x 3 using left internal mammory artery and greater saphenous vein right leg harvested endoscopically.;  Surgeon: Grace Isaac, MD;  LIMA-LAD, SVG-RI, SVG-PDA  . FACELIFT, LOWER 2/3  1995   "mini" (01/21/2012)  . Lower Arterial Examination  10/28/2011   R. SFA stent mild-moderate mixed density plaque with elevated velocities consistent with 50% diameter reduction. L. SFA stent moderate mixed denisty plaque at mid to distal level consistent with 50-69% diameter reduction.  . OOPHORECTOMY  ~1979  . PACEMAKER INSERTION  01/21/2012   Medtronic Adapta, model# ADDRL1, serial# [id number]  . PARTIAL COLECTOMY  2010  . PERIPHERAL ARTERIAL STENT GRAFT  2012; 2012   "LLE; RLE" (01/21/2012)  . PERMANENT PACEMAKER INSERTION N/A 01/21/2012   Medtronic Adapta L implanted by Dr Sallyanne Kuster for tachy/brady syndrome  . Persantine Myoview Stress Test  02/25/2010   No scintigraphic evidence of inducible myocardial ischemia. No persantine EKG changes. Non-diagnositc for ishcemia. Low-risk, normal scan.  Marland Kitchen POSTERIOR CERVICAL LAMINECTOMY  1985  . TEE WITHOUT CARDIOVERSION N/A 01/04/2015   Procedure: TRANSESOPHAGEAL ECHOCARDIOGRAM (TEE);  Surgeon: Grace Isaac, MD;  Location: [facility];  Service: Open Heart Surgery;  Laterality: N/A;  . TEE WITHOUT CARDIOVERSION N/A 02/01/2015   Procedure: TRANSESOPHAGEAL ECHOCARDIOGRAM (TEE);  Surgeon: Lelon Perla, MD;  Location: [facility] ENDOSCOPY;  Service: Cardiovascular;  Laterality: N/A;  . TEE WITHOUT CARDIOVERSION N/A 08/30/2015   Procedure: TRANSESOPHAGEAL ECHOCARDIOGRAM (TEE);  Surgeon: Sanda Klein, MD;  Location: [facility] ENDOSCOPY;  Service:  Cardiovascular;  Laterality: N/A;  . [place]    OB History    No data available       Home Medications    Prior to Admission medications   Medication Sig Start Date End Date Taking? Authorizing Provider  amiodarone (PACERONE) 100 MG tablet Take 1 tablet (100 mg total) by mouth daily. 10/17/15   Mihai Croitoru, MD  cholecalciferol (VITAMIN D) 1000 UNITS tablet Take 1,000 Units by mouth daily.    Historical Provider, MD  citalopram (CELEXA) 20 MG tablet Take 1 tablet (20 mg total) by mouth daily. 12/05/12   Brett Canales, PA-C  clopidogrel (PLAVIX) 75 MG tablet TAKE 1 TABLET (75 MG TOTAL) BY MOUTH DAILY. 12/02/15   Mihai Croitoru, MD  Coenzyme Q10 (COQ10) 100 MG CAPS Take 100 mg by mouth 2 (two) times daily.    Historical Provider, MD  Cyanocobalamin (VITAMIN B-12 PO) Take 1,000 mg by mouth daily.     Historical Provider, MD  lisinopril (PRINIVIL,ZESTRIL) 20 MG tablet Take 1 tablet (20 mg total) by mouth daily. 11/05/15   Mihai Croitoru, MD  metoprolol (LOPRESSOR) 50 MG tablet Take 1 tablet (50 mg total) by mouth 2 (two) times daily. 09/25/15 12/24/15  Thompson Grayer, MD  Multiple Vitamin (MULTIVITAMIN) capsule Take 1 capsule by mouth daily.    Historical Provider, MD  nitroGLYCERIN (NITROSTAT) 0.4 MG SL tablet Place 1 tablet (0.4 mg total) under the tongue every 5 (five) minutes x 3 doses as needed for chest pain. 05/15/15   Brittainy Erie Noe, PA-C  rivaroxaban (XARELTO) 15 MG TABS tablet Take 1 tablet (15 mg total) by mouth daily with supper. 11/04/15   Mihai Croitoru, MD  vitamin E 400 UNIT capsule Take 400 Units by mouth daily.    Historical Provider, MD    Family History Family History  Problem Relation Age of Onset  . Heart disease Mother   . Hypertension Mother   . Stroke Mother   . Stroke Father   . Heart disease Father   . Heart disease Brother   . Hypertension Brother     2 brothers  . CVA Brother     2 brothers  . Heart attack Brother     2 brothers     Social History Social History  Substance Use Topics  . Smoking status: Former Smoker    Packs/day: 0.75    Years: 10.00    Types: Cigarettes  . Smokeless tobacco: Never Used     Comment: 01/21/2012 "quit smoking ~ 2002"  . Alcohol use Yes     Comment: 01/21/2012 "3 times/wk I have a couple mixed drinks"; 06/28/2015 "nothing in the last 3 weeks cause I haven't felt good", reduced recently     Allergies   Brilinta [ticagrelor]; Clonidine derivatives; Atorvastatin; Crestor [rosuvastatin calcium]; and Exforge [amlodipine besylate-valsartan]   Review of Systems Review of Systems  Constitutional: Positive for fatigue. Negative for chills, diaphoresis, fever and unexpected weight change.       Chronic fatigue  Respiratory: Positive for shortness of breath. Negative for cough.   Cardiovascular: Positive for chest pain. Negative for palpitations and leg swelling.  Gastrointestinal: Negative for  abdominal pain, diarrhea and vomiting.  Neurological: Positive for light-headedness and headaches. Negative for syncope, speech difficulty, weakness and numbness.  All other systems reviewed and are negative.    Physical Exam Updated Vital Signs BP 176/86   Pulse 79   Temp 98 F (36.7 C) (Oral)   Resp 15   Ht 5\' 5"  (1.651 m)   Wt 59 kg   SpO2 98%   BMI 21.63 kg/m   Physical Exam  Constitutional: She is oriented to person, place, and time. She appears well-developed and well-nourished. No distress.  Well-appearing, NAD  HENT:  Head: Normocephalic and atraumatic.  Eyes: Conjunctivae are normal. Pupils are equal, round, and reactive to light. Right eye exhibits no discharge. Left eye exhibits no discharge. No scleral icterus.  Neck: Normal range of motion. Neck supple.  Cardiovascular: Normal rate and regular rhythm.  Exam reveals no gallop and no friction rub.   Murmur heard. Pulmonary/Chest: Effort normal and breath sounds normal. No respiratory distress. She has no wheezes. She  has no rales. She exhibits no tenderness.  Abdominal: Soft. Bowel sounds are normal. She exhibits no distension and no mass. There is no tenderness. There is no rebound and no guarding. No hernia.  Musculoskeletal: She exhibits no edema.  Neurological: She is alert and oriented to person, place, and time.  Mental Status:  Alert, oriented, thought content appropriate, able to give a coherent history. Speech fluent without evidence of aphasia. Able to follow 2 step commands without difficulty.  Cranial Nerves:  II:  Peripheral visual fields grossly normal, pupils equal, round, reactive to light III,IV, VI: ptosis not present, extra-ocular motions intact bilaterally  V,VII: smile symmetric, facial light touch sensation equal VIII: hearing grossly normal to voice  X: uvula elevates symmetrically  XI: bilateral shoulder shrug symmetric and strong XII: midline tongue extension without fassiculations Motor:  Normal tone. 5/5 in upper and lower extremities bilaterally including strong and equal grip strength and dorsiflexion/plantar flexion Cerebellar: normal finger-to-nose with bilateral upper extremities Gait: normal gait and balance CV: distal pulses palpable throughout    Skin: Skin is warm and dry.  Psychiatric: She has a normal mood and affect. Her behavior is normal.  Nursing note and vitals reviewed.    ED Treatments / Results  Labs (all labs ordered are listed, but only abnormal results are displayed) Labs Reviewed  BASIC METABOLIC PANEL - Abnormal; Notable for the following:       Result Value   Potassium 5.7 (*)    Glucose, Bld 110 (*)    Calcium 10.9 (*)    GFR calc non Af Amer 56 (*)    All other components within normal limits  CBC - Abnormal; Notable for the following:    RDW 19.7 (*)    All other components within normal limits  BRAIN NATRIURETIC PEPTIDE - Abnormal; Notable for the following:    B Natriuretic Peptide 499.0 (*)    All other components within normal  limits  TROPONIN I  I-STAT TROPOININ, ED    EKG  EKG Interpretation None       Radiology Dg Chest 2 View  Result Date: 12/02/2015 CLINICAL DATA:  Chest pain, shortness of breath EXAM: CHEST  2 VIEW COMPARISON:  CT chest 08/26/2015 FINDINGS: There is no focal parenchymal opacity. There is no pleural effusion or pneumothorax. There is mild stable cardiomegaly. There is a dual lead AICD. There is evidence of prior CABG. The osseous structures are unremarkable. IMPRESSION: No active cardiopulmonary disease. Electronically  Signed   By: Kathreen Devoid   On: 12/02/2015 16:00   Ct Head Wo Contrast  Result Date: 12/02/2015 CLINICAL DATA:  Initial evaluation for acute headache. EXAM: CT HEAD WITHOUT CONTRAST TECHNIQUE: Contiguous axial images were obtained from the base of the skull through the vertex without intravenous contrast. COMPARISON:  None. FINDINGS: Brain: Generalized cerebral atrophy with chronic microvascular ischemic disease. No acute intracranial hemorrhage. No evidence for acute large vessel territory infarct. No mass lesion, midline shift or mass effect. No hydrocephalus. No extra-axial fluid collection. Vascular: No hyperdense vessel. Scattered vascular calcifications noted. Skull: Scalp soft tissues within normal limits.  Calvarium intact. Sinuses/Orbits: Globes and orbital soft tissues within normal limits. Patient is status post lens extraction on the left. Paranasal sinuses and mastoid air cells are clear. IMPRESSION: 1. No acute intracranial process. 2. Generalized age-related cerebral atrophy with moderate chronic microvascular ischemic disease. Electronically Signed   By: Jeannine Boga M.D.   On: 12/02/2015 20:50    Procedures Procedures (including critical care time)  Medications Ordered in ED Medications  sodium chloride 0.9 % bolus 500 mL (0 mLs Intravenous Stopped 12/02/15 2147)  labetalol (NORMODYNE,TRANDATE) injection 10 mg (10 mg Intravenous Given 12/02/15  2029)  insulin aspart (novoLOG) injection 5 Units (5 Units Intravenous Given 12/02/15 2146)  dextrose 50 % solution 50 mL (50 mLs Intravenous Given 12/02/15 2146)  sodium polystyrene (KAYEXALATE) 15 GM/60ML suspension 15 g (15 g Oral Given 12/02/15 2245)     Initial Impression / Assessment and Plan / ED Course  I have reviewed the triage vital signs and the nursing notes.  Pertinent labs & imaging results that were available during my care of the patient were reviewed by me and considered in my medical decision making (see chart for details).  Clinical Course   75 year old female presents with chest pain and HA. Patient is afebrile, not tachycardic or tachypneic, and not hypoxic. She is markedly hypertensive with highest reading 201/95. 10mg  Labetolol given with some improvement. HA is somewhat concerning since it was most severe HA with sudden onset however this was yesterday. CT head is negative. Low concern for SAH with resolved HA for >24 hours and normal neuro exam.   Chest pain workup has been overall unremarkable. EKG is unchanged. Initial troponin is 0. Repeat troponin is <0.03. CXR is unremarkable. CBC is unremarkable. BMP is remarkable for K of 5.7 and hypercalcemia of 10.9. BNP is 499.0. 500 cc NS given. HEART score is 5. Spoke with Dr. Aundra Dubin with cardiology who states cardiology team will see her tomorrow. Spoke with Dr. Roel Cluck who will admit.    Final Clinical Impressions(s) / ED Diagnoses   Final diagnoses:  Chest pain, unspecified type    New Prescriptions New Prescriptions   No medications on file     Recardo Evangelist, PA-C 12/02/15 [place], MD 12/07/15 1529

## 2015-12-02 NOTE — ED Triage Notes (Signed)
Pt reports feeling a fullness in her chest since this am when she woke up with dizziness and just not feeling "well".

## 2015-12-02 NOTE — H&P (Signed)
[name] [id number] DOB: 21-Apr-1940 DOA: 12/02/2015     PCP: [name], MD   Outpatient Specialists: Cardiology Croitoru/kelly   Patient coming from:   home Lives  With family    Chief Complaint: Chest Pain   HPI: [name] is a 75 y.o. female with medical history significant of CAD status post bypass, A.fib, alcohol abuse, PVD, HTN, dyslipidemia, hyperkalemia, hypercalcemia with probable hyperparathyroidism, chronic fatigue    Presented with fullness in her chest since this morning when she woke up she felt lightheaded and overall feeling unwell symptoms were associated headache and lightheadedness.  2 days ago she had similar chest pain also associated headache at that time and chest pain was in the right side of her chest lasting only several minutes. She's been having intermittent headache for the past 3 days. Described as feels that her head is going to explode) chest pain currently has resolved this was associated shortness of breath. She reports chest pain yesterday was similar to prior MI.  Currently chest pan free. He Fiance has been admitted to the hospital recently with C.diff. She has 2 teenagers at home Patient had not had any fevers chills no nausea no vomiting no diarrhea Regarding pertinent Chronic problems: History of atrial fibrillation for the past 10 years underwent recent cardioversion  July 2017but has returned to atrial flutter in July she was started on amiodarone.  Recent TEE revealed preserved EF, mild AI, moderate to severe TR last cardiac catheterization was in April 2017 due to non-ST segment elevation myocardial infarction at that time she required placement of stents in the native right coronary artery and saphenous venous graft The saphenous vein graft to the ramus intermedius was also occluded, but this vessel was left for medical therapy.  Patient have stopped statin secondary to leg weakness and does not tolerate Brilinta Patient  undergone CABG 3 in November 2016 patient on xarelto and Plavix She status post pacemaker insertion in 2013 pacemaker was interrogated in September 2017 showed normal device function She has not had any atrial fibrillation since starting amiodarone.  Patient have had trouble with hyper anemia and has spell and not that has been discontinued  IN ER:  Temp (24hrs), Avg:97.9 F (36.6 C), Min:97.6 F (36.4 C), Max:98.2 F (36.8 C)    RR 16 HR 75 BP 175/80   BNP 499 Troponin 0.00 Sodium 140 1K5.7 CR 0.97  WBC 4.3 hemoglobin 12.7  CT head nonacute  Chest x-ray nonacute  Following Medications were ordered in ER: Medications  sodium chloride 0.9 % bolus 500 mL (0 mLs Intravenous Stopped 12/02/15 2147)  labetalol (NORMODYNE,TRANDATE) injection 10 mg (10 mg Intravenous Given 12/02/15 2029)  insulin aspart (novoLOG) injection 5 Units (5 Units Intravenous Given 12/02/15 2146)  dextrose 50 % solution 50 mL (50 mLs Intravenous Given 12/02/15 2146)  sodium polystyrene (KAYEXALATE) 15 GM/60ML suspension 15 g (15 g Oral Given 12/02/15 2245)     ER provider discussed case with: Radiology who recommends admission to medicine daily seen in consult in the morning  Hospitalist was called for admission for chest pain evaluation  Review of Systems:    Pertinent positives include: chest pain, headaches, shortness of breath at rest, dyspnea on exertion,   Constitutional:  No weight loss, night sweats, Fevers, chills, fatigue, weight loss  HEENT:  No Difficulty swallowing,Tooth/dental problems,Sore throat,  No sneezing, itching, ear ache, nasal congestion, post nasal drip,  Cardio-vascular:  No  Orthopnea, PND, anasarca, dizziness, palpitations.no Bilateral lower  extremity swelling  GI:  No heartburn, indigestion, abdominal pain, nausea, vomiting, diarrhea, change in bowel habits, loss of appetite, melena, blood in stool, hematemesis Resp:  no . No No excess mucus, no productive cough, No  non-productive cough, No coughing up of blood.No change in color of mucus.No wheezing. Skin:  no rash or lesions. No jaundice GU:  no dysuria, change in color of urine, no urgency or frequency. No straining to urinate.  No flank pain.  Musculoskeletal:  No joint pain or no joint swelling. No decreased range of motion. No back pain.  Psych:  No change in mood or affect. No depression or anxiety. No memory loss.  Neuro: no localizing neurological complaints, no tingling, no weakness, no double vision, no gait abnormality, no slurred speech, no confusion  As per HPI otherwise 10 point review of systems negative.   Past Medical History: Past Medical History:  Diagnosis Date  . ABDOMINAL PAIN, LOWER 12/31/2009  . ANXIETY 12/31/2009  . Aortic insufficiency   . CORONARY ARTERY DISEASE 12/31/2009  . DEPRESSION 12/31/2009  . [place] DISEASE, CERVICAL 12/31/2009  . [place] DISEASE, LUMBAR 12/31/2009  . DIVERTICULITIS, HX OF 12/31/2009  . GLUCOSE INTOLERANCE 12/31/2009  . HYPERLIPIDEMIA 12/31/2009  . HYPERTENSION 12/31/2009  . MENOPAUSE, EARLY 12/31/2009  . MITRAL REGURGITATION 12/31/2009  . MITRAL VALVE PROLAPSE 12/31/2009  . NSTEMI (non-ST elevated myocardial infarction) ([place]) 05/11/2015  . OSTEOARTHRITIS, HAND 12/31/2009  . Pacemaker 01/21/2012   MDT Adapta dual chamber  . Persistent atrial fibrillation ([place]) 01/22/2012  . PVD (peripheral vascular disease), hx stents to bil SFAs 02/2010 01/22/2012  . Symptomatic sinus bradycardia 01/22/2012  . Tricuspid regurgitation   . Unspecified urinary incontinence 12/31/2009   Past Surgical History:  Procedure Laterality Date  . AUGMENTATION MAMMAPLASTY  1983  . CARDIAC CATHETERIZATION  2005   "negative" (01/21/2012)  . CARDIAC CATHETERIZATION  09/17/1999   Mild CAD involving proximal portion of LAD. Mitral valve prolapse w/ mitral regurg. No evidence of renal artery stenosis. Recommended readjustment of antihypertensive medications.  Marland Kitchen CARDIAC  CATHETERIZATION N/A 01/01/2015   Procedure: Left Heart Cath and Coronary Angiography;  Surgeon: Jettie Booze, MD;  Location: [place] [facility];  Service: Cardiovascular;  Laterality: N/A;  . CARDIAC CATHETERIZATION N/A 05/12/2015   Procedure: Left Heart Cath and Coronary Angiography;  Surgeon: Troy Sine, MD;  Location: [place];  Service: Cardiovascular;  Laterality: N/A;  . CARDIAC CATHETERIZATION N/A 05/12/2015   Procedure: Coronary Stent Intervention;  Surgeon: Troy Sine, MD;  Location: [place] [facility];  Service: Cardiovascular;  Laterality: N/A;  . CARDIAC SURGERY     11/20/20116  . CARDIOVERSION N/A 02/01/2015   Procedure: CARDIOVERSION;  Surgeon: Lelon Perla, MD;  Location: [facility];  Service: Cardiovascular;  Laterality: N/A;  . CARDIOVERSION N/A 08/30/2015   Procedure: CARDIOVERSION;  Surgeon: Sanda Klein, MD;  Location: [place] [facility];  Service: Cardiovascular;  Laterality: N/A;  . CARPAL TUNNEL RELEASE  2008   "right hand/thumb; carpal tunnel repair; got rid of arthritis" (01/21/2012)  . CLIPPING OF ATRIAL APPENDAGE N/A 01/04/2015   Procedure: CLIPPING OF ATRIAL APPENDAGE;  Surgeon: Grace Isaac, MD;  Location: [place];  Service: Open Heart Surgery;  Laterality: N/A;  . CORONARY ARTERY BYPASS GRAFT N/A 01/04/2015   Procedure: CORONARY ARTERY BYPASS GRAFTING (CABG) x 3 using left internal mammory artery and greater saphenous vein right leg harvested endoscopically.;  Surgeon: Grace Isaac, MD;  LIMA-LAD, SVG-RI, SVG-PDA  . FACELIFT, LOWER 2/3  1995   "mini" (01/21/2012)  . Lower Arterial Examination  10/28/2011   R. SFA stent mild-moderate mixed density plaque with elevated velocities consistent with 50% diameter reduction. L. SFA stent moderate mixed denisty plaque at mid to distal level consistent with 50-69% diameter reduction.  . OOPHORECTOMY  ~1979  . PACEMAKER INSERTION  01/21/2012   Medtronic Adapta, model# ADDRL1, serial# [id number]    . PARTIAL COLECTOMY  2010  . PERIPHERAL ARTERIAL STENT GRAFT  2012; 2012   "LLE; RLE" (01/21/2012)  . PERMANENT PACEMAKER INSERTION N/A 01/21/2012   Medtronic Adapta L implanted by Dr Sallyanne Kuster for tachy/brady syndrome  . Persantine Myoview Stress Test  02/25/2010   No scintigraphic evidence of inducible myocardial ischemia. No persantine EKG changes. Non-diagnositc for ishcemia. Low-risk, normal scan.  Marland Kitchen POSTERIOR CERVICAL LAMINECTOMY  1985  . TEE WITHOUT CARDIOVERSION N/A 01/04/2015   Procedure: TRANSESOPHAGEAL ECHOCARDIOGRAM (TEE);  Surgeon: Grace Isaac, MD;  Location: [place];  Service: Open Heart Surgery;  Laterality: N/A;  . TEE WITHOUT CARDIOVERSION N/A 02/01/2015   Procedure: TRANSESOPHAGEAL ECHOCARDIOGRAM (TEE);  Surgeon: Lelon Perla, MD;  Location: [facility] END[facility]COPY;  Service: Cardiovascular;  Laterality: N/A;  . TEE WITHOUT CARDIOVERSION N/A 08/30/2015   Procedure: TRANSESOPHAGEAL ECHOCARDIOGRAM (TEE);  Surgeon: Sanda Klein, MD;  Location: [facility] ENDOSCOPY;  Service: Cardiovascular;  Laterality: N/A;  . [place]     Social History:  Ambulatory   independently       reports that she has quit smoking. Her smoking use included Cigarettes. She has a 7.50 pack-year smoking history. She has never used smokeless tobacco. She reports that she drinks alcohol. She reports that she does not use drugs.  Allergies:   Allergies  Allergen Reactions  . Brilinta [Ticagrelor] Shortness Of Breath  . Clonidine Derivatives Other (See Comments)    Lowers heart rate  . Atorvastatin Other (See Comments)    Possible cause of fatigue/malaise  . Crestor [Rosuvastatin Calcium] Other (See Comments)    Joint/muscle aches  . Exforge [Amlodipine Besylate-Valsartan] Itching and Rash       Family History:   Family History  Problem Relation Age of Onset  . Heart disease Mother   . Hypertension Mother   . Stroke Mother   . Stroke Father   . Heart disease Father   . Heart  disease Brother   . Hypertension Brother     2 brothers  . CVA Brother     2 brothers  . Heart attack Brother     2 brothers    Medications: Prior to Admission medications   Medication Sig Start Date End Date Taking? Authorizing Provider  amiodarone (PACERONE) 100 MG tablet Take 1 tablet (100 mg total) by mouth daily. 10/17/15  Yes Mihai Croitoru, MD  cholecalciferol (VITAMIN D) 1000 UNITS tablet Take 1,000 Units by mouth daily.   Yes Historical Provider, MD  citalopram (CELEXA) 20 MG tablet Take 1 tablet (20 mg total) by mouth daily. Patient taking differently: Take 10 mg by mouth daily.  12/05/12  Yes Brett Canales, PA-C  clopidogrel (PLAVIX) 75 MG tablet TAKE 1 TABLET (75 MG TOTAL) BY MOUTH DAILY. 12/02/15  Yes Mihai Croitoru, MD  Coenzyme Q10 (COQ10 PO) Take 120 mg by mouth daily.   Yes Historical Provider, MD  lisinopril (PRINIVIL,ZESTRIL) 20 MG tablet Take 1 tablet (20 mg total) by mouth daily. Patient taking differently: Take 10 mg by mouth daily.  11/05/15  Yes Mihai Croitoru, MD  metoprolol (LOPRESSOR) 50 MG tablet  Take 1 tablet (50 mg total) by mouth 2 (two) times daily. Patient taking differently: Take 50 mg by mouth daily.  09/25/15 12/24/15 Yes Thompson Grayer, MD  Multiple Vitamin (MULTIVITAMIN) capsule Take 1 capsule by mouth daily.   Yes Historical Provider, MD  nitroGLYCERIN (NITROSTAT) 0.4 MG SL tablet Place 1 tablet (0.4 mg total) under the tongue every 5 (five) minutes x 3 doses as needed for chest pain. 05/15/15  Yes Brittainy Erie Noe, PA-C  rivaroxaban (XARELTO) 15 MG TABS tablet Take 1 tablet (15 mg total) by mouth daily with supper. 11/04/15  Yes Mihai Croitoru, MD    Physical Exam: Patient Vitals for the past 24 hrs:  BP Temp Temp src Pulse Resp SpO2 Height Weight  12/02/15 2230 175/80 - - 75 16 96 % - -  12/02/15 2200 176/81 - - 76 14 98 % - -  12/02/15 2130 191/92 - - 79 20 99 % - -  12/02/15 2100 183/87 - - 78 15 98 % - -  12/02/15 2030 187/95 - - 77 14 99 % - -    12/02/15 1937 (!) 201/95 98.2 F (36.8 C) Oral 76 12 96 % - -  12/02/15 1900 173/70 - - 76 15 97 % - -  12/02/15 1847 176/86 - - 79 15 98 % - -  12/02/15 1726 153/84 98 F (36.7 C) Oral 78 17 99 % - -  12/02/15 1518 159/89 97.6 F (36.4 C) Oral 77 16 96 % 5\' 5"  (1.651 m) 59 kg (130 lb)    1. General:  in No Acute distress 2. Psychological: Alert and  Oriented 3. Head/ENT:    Dry Mucous Membranes                          Head Non traumatic, neck supple                          Normal  Dentition 4. SKIN: normal   Skin turgor,  Skin clean Dry and intact no rash 5. Heart: Regular rate and rhythm Murmur, Rub or gallop 6. Lungs:  no wheezes or crackles   7. Abdomen: Soft,  non-tender, Non distended 8. Lower extremities: no clubbing, cyanosis, or edema 9. Neurologically Grossly intact, moving all 4 extremities equally  10. MSK: Normal range of motion   body mass index is 21.63 kg/m.  Labs on Admission:   Labs on Admission: I have personally reviewed following labs and imaging studies  CBC:  Recent Labs Lab 12/02/15 1523  WBC 4.3  HGB 12.7  HCT 39.0  MCV 84.2  PLT Q000111Q   Basic Metabolic Panel:  Recent Labs Lab 12/02/15 1523  NA 141  K 5.7*  CL 105  CO2 29  GLUCOSE 110*  BUN 16  CREATININE 0.97  CALCIUM 10.9*   GFR: Estimated Creatinine Clearance: 45.1 mL/min (by C-G formula based on SCr of 0.97 mg/dL). Liver Function Tests: No results for input(s): AST, ALT, ALKPHOS, BILITOT, PROT, ALBUMIN in the last 168 hours. No results for input(s): LIPASE, AMYLASE in the last 168 hours. No results for input(s): AMMONIA in the last 168 hours. Coagulation Profile: No results for input(s): INR, PROTIME in the last 168 hours. Cardiac Enzymes: No results for input(s): CKTOTAL, CKMB, CKMBINDEX, TROPONINI in the last 168 hours. BNP (last 3 results) No results for input(s): PROBNP in the last 8760 hours. HbA1C: No results for input(s): HGBA1C in  the last 72 hours. CBG: No  results for input(s): GLUCAP in the last 168 hours. Lipid Profile: No results for input(s): CHOL, HDL, LDLCALC, TRIG, CHOLHDL, LDLDIRECT in the last 72 hours. Thyroid Function Tests: No results for input(s): TSH, T4TOTAL, FREET4, T3FREE, THYROIDAB in the last 72 hours. Anemia Panel: No results for input(s): VITAMINB12, FOLATE, FERRITIN, TIBC, IRON, RETICCTPCT in the last 72 hours.  Sepsis Labs: @LABRCNTIP (procalcitonin:4,lacticidven:4) )No results found for this or any previous visit (from the past 240 hour(s)).     UA not ordered  Lab Results  Component Value Date   HGBA1C 6.4 (H) 01/31/2015    Estimated Creatinine Clearance: 45.1 mL/min (by C-G formula based on SCr of 0.97 mg/dL).  BNP (last 3 results) No results for input(s): PROBNP in the last 8760 hours.   ECG REPORT  Independently reviewed Rate: 79  Rhythm: Atrial paced  ST&T Change: No acute ischemic changes  QTC 489  Filed Weights   12/02/15 1518  Weight: 59 kg (130 lb)     Cultures:    Component Value Date/Time   SDES BLOOD RIGHT HAND 06/28/2015 0110   SPECREQUEST IN PEDIATRIC BOTTLE 3ML 06/28/2015 0110   CULT NO GROWTH 5 DAYS 06/28/2015 0110   REPTSTATUS 07/03/2015 FINAL 06/28/2015 0110     Radiological Exams on Admission: Dg Chest 2 View  Result Date: 12/02/2015 CLINICAL DATA:  Chest pain, shortness of breath EXAM: CHEST  2 VIEW COMPARISON:  CT chest 08/26/2015 FINDINGS: There is no focal parenchymal opacity. There is no pleural effusion or pneumothorax. There is mild stable cardiomegaly. There is a dual lead AICD. There is evidence of prior CABG. The osseous structures are unremarkable. IMPRESSION: No active cardiopulmonary disease. Electronically Signed   By: Kathreen Devoid   On: 12/02/2015 16:00   Ct Head Wo Contrast  Result Date: 12/02/2015 CLINICAL DATA:  Initial evaluation for acute headache. EXAM: CT HEAD WITHOUT CONTRAST TECHNIQUE: Contiguous axial images were obtained from the base of the skull  through the vertex without intravenous contrast. COMPARISON:  None. FINDINGS: Brain: Generalized cerebral atrophy with chronic microvascular ischemic disease. No acute intracranial hemorrhage. No evidence for acute large vessel territory infarct. No mass lesion, midline shift or mass effect. No hydrocephalus. No extra-axial fluid collection. Vascular: No hyperdense vessel. Scattered vascular calcifications noted. Skull: Scalp soft tissues within normal limits.  Calvarium intact. Sinuses/Orbits: Globes and orbital soft tissues within normal limits. Patient is status post lens extraction on the left. Paranasal sinuses and mastoid air cells are clear. IMPRESSION: 1. No acute intracranial process. 2. Generalized age-related cerebral atrophy with moderate chronic microvascular ischemic disease. Electronically Signed   By: Jeannine Boga M.D.   On: 12/02/2015 20:50    Chart has been reviewed    Assessment/Plan   75 y.o. female with medical history significant of CAD status post bypass, A.fib, alcohol abuse, PVD, HTN, dyslipidemia, hyperkalemia, hypercalcemia with probable hyperparathyroidism, chronic fatigue Admitted for chest pain evaluation  Present on Admission: . Chest pain - per sheet cardiology, consult will admit to telemetry cycle cardiac enzymes obtain echogram . CAD (coronary artery disease) - continue home medications appreciate cardiology input  . Chronic diastolic heart failure (HCC) currently appears to be euvolemic continue home medications . Essential hypertension  - continue home medications give when necessary labetalol . Atrial fibrillation with RVR ([place]) - continue amiodarone currently rate controlled continue Xarelto  Other plan as per orders.  DVT prophylaxis:  Xarelto  Code Status:   Limited code DNI  as per  patient    Family Communication:   Family not  at  Bedside    Disposition Plan:     To home once workup is complete and patient is stable                               Consults called: Cardiology aware   Admission status:   obs   Level of care    tele        I have spent a total of 56 min on this admission    Rabon Scholle 12/03/2015, 12:54 AM    [facility]  Pager [phone]   after 2 AM please page floor coverage PA If 7AM-7PM, please contact the day team taking care of the patient  [email or web address]  Password [email or web address]

## 2015-12-02 NOTE — ED Notes (Signed)
Orthostatics 19:23 done on Left arm Orthostatics 19:28 done on Right arm

## 2015-12-02 NOTE — Telephone Encounter (Signed)
Patient requested a short term supply be sent to [facility] and a ninety day supply be sent to [place].

## 2015-12-03 DIAGNOSIS — I2510 Atherosclerotic heart disease of native coronary artery without angina pectoris: Secondary | ICD-10-CM

## 2015-12-03 DIAGNOSIS — Z789 Other specified health status: Secondary | ICD-10-CM

## 2015-12-03 DIAGNOSIS — I160 Hypertensive urgency: Secondary | ICD-10-CM

## 2015-12-03 DIAGNOSIS — E782 Mixed hyperlipidemia: Secondary | ICD-10-CM

## 2015-12-03 DIAGNOSIS — I5032 Chronic diastolic (congestive) heart failure: Secondary | ICD-10-CM

## 2015-12-03 DIAGNOSIS — I480 Paroxysmal atrial fibrillation: Secondary | ICD-10-CM

## 2015-12-03 DIAGNOSIS — I4891 Unspecified atrial fibrillation: Secondary | ICD-10-CM | POA: Diagnosis not present

## 2015-12-03 DIAGNOSIS — I10 Essential (primary) hypertension: Secondary | ICD-10-CM | POA: Diagnosis not present

## 2015-12-03 LAB — BASIC METABOLIC PANEL
Anion gap: 6 (ref 5–15)
BUN: 7 mg/dL (ref 6–20)
CO2: 28 mmol/L (ref 22–32)
Calcium: 10.5 mg/dL — ABNORMAL HIGH (ref 8.9–10.3)
Chloride: 103 mmol/L (ref 101–111)
Creatinine, Ser: 0.92 mg/dL (ref 0.44–1.00)
GFR calc Af Amer: >60 mL/min (ref >60)
GFR calc non Af Amer: 59 mL/min — ABNORMAL LOW (ref >60)
Glucose, Bld: 90 mg/dL (ref 65–99)
Potassium: 3.8 mmol/L (ref 3.5–5.1)
Sodium: 137 mmol/L (ref 135–145)

## 2015-12-03 LAB — TROPONIN I
Troponin I: <0.03 ng/mL (ref <0.03)
Troponin I: <0.03 ng/mL (ref <0.03)
Troponin I: <0.03 ng/mL (ref <0.03)

## 2015-12-03 LAB — GLUCOSE, CAPILLARY: Glucose-Capillary: 112 mg/dL — ABNORMAL HIGH (ref 65–99)

## 2015-12-03 MED ORDER — LABETALOL HCL 5 MG/ML IV SOLN
10.0000 mg | Freq: Once | INTRAVENOUS | Status: AC
  Administered 2015-12-03: 10 mg via INTRAVENOUS
  Filled 2015-12-03: qty 4

## 2015-12-03 MED ORDER — HYDROCHLOROTHIAZIDE 25 MG PO TABS
25.0000 mg | ORAL_TABLET | Freq: Every day | ORAL | Status: DC
  Administered 2015-12-03 – 2015-12-04 (×2): 25 mg via ORAL
  Filled 2015-12-03 (×2): qty 1

## 2015-12-03 MED ORDER — CARVEDILOL 25 MG PO TABS
25.0000 mg | ORAL_TABLET | Freq: Two times a day (BID) | ORAL | Status: DC
  Administered 2015-12-03 – 2015-12-04 (×2): 25 mg via ORAL
  Filled 2015-12-03 (×2): qty 1

## 2015-12-03 MED ORDER — METOPROLOL TARTRATE 50 MG PO TABS
50.0000 mg | ORAL_TABLET | Freq: Every day | ORAL | Status: DC
  Administered 2015-12-03: 50 mg via ORAL
  Filled 2015-12-03: qty 1

## 2015-12-03 MED ORDER — ACETAMINOPHEN 325 MG PO TABS: 650.0000 mg | ORAL_TABLET | ORAL | Status: DC | PRN

## 2015-12-03 MED ORDER — CLOPIDOGREL BISULFATE 75 MG PO TABS
75.0000 mg | ORAL_TABLET | Freq: Every day | ORAL | Status: DC
  Administered 2015-12-03 – 2015-12-04 (×2): 75 mg via ORAL
  Filled 2015-12-03 (×2): qty 1

## 2015-12-03 MED ORDER — AMIODARONE HCL 200 MG PO TABS
100.0000 mg | ORAL_TABLET | Freq: Every day | ORAL | Status: DC
  Administered 2015-12-03 – 2015-12-04 (×2): 100 mg via ORAL
  Filled 2015-12-03 (×2): qty 1

## 2015-12-03 MED ORDER — MORPHINE SULFATE (PF) 2 MG/ML IV SOLN: 2.0000 mg | INTRAVENOUS | Status: DC | PRN

## 2015-12-03 MED ORDER — RIVAROXABAN 15 MG PO TABS
15.0000 mg | ORAL_TABLET | Freq: Every day | ORAL | Status: DC
  Administered 2015-12-03: 15 mg via ORAL
  Filled 2015-12-03: qty 1

## 2015-12-03 MED ORDER — ONDANSETRON HCL 4 MG/2ML IJ SOLN: 4.0000 mg | Freq: Four times a day (QID) | INTRAMUSCULAR | Status: DC | PRN

## 2015-12-03 MED ORDER — LISINOPRIL 10 MG PO TABS
20.0000 mg | ORAL_TABLET | Freq: Every day | ORAL | Status: DC
  Administered 2015-12-03 – 2015-12-04 (×2): 20 mg via ORAL
  Filled 2015-12-03 (×2): qty 2

## 2015-12-03 MED ORDER — ALPRAZOLAM 0.25 MG PO TABS
0.2500 mg | ORAL_TABLET | Freq: Two times a day (BID) | ORAL | Status: DC | PRN
  Administered 2015-12-03: 0.25 mg via ORAL
  Filled 2015-12-03: qty 1

## 2015-12-03 NOTE — Discharge Instructions (Signed)

## 2015-12-03 NOTE — Care Management Note (Signed)
Case Management Note  Patient Details  Name: CYTLALI BIZZELL MRN: [id number] Date of Birth: Mar 10, 1940  Subjective/Objective:     Chest pain                Action/Plan: Discharge Planning: NCM spoke to pt and she lives at home alone. States she is independent at home. Can afford her medications. States three of her meds she receives free from [employer].   PCP Kathyrn Lass MD  Expected Discharge Date:  12/05/15               Expected Discharge Plan:  Home/Self Care  In-House Referral:  NA  Discharge planning Services  CM Consult  Post Acute Care Choice:  NA Choice offered to:  NA  DME Arranged:  N/A DME Agency:  NA  HH Arranged:  NA HH Agency:  NA  Status of Service:  Completed, signed off  If discussed at [place] of Stay Meetings, dates discussed:    Additional Comments:  Erenest Rasher, RN 12/03/2015, 10:29 AM

## 2015-12-03 NOTE — Care Management Obs Status (Signed)
[place] NOTIFICATION   Patient Details  Name: [name] MRN: [id number] Date of Birth: 1940-10-09   Medicare Observation Status Notification Given:  Yes    Erenest Rasher, RN 12/03/2015, 10:28 AM

## 2015-12-03 NOTE — Progress Notes (Signed)
Patient arrived to unit. MD notified of arrival. Patient alert and oriented. No chest pain at this time. Placed on telemetry (has pacemaker). Hemodynamically stable.

## 2015-12-03 NOTE — Consult Note (Signed)
CARDIOLOGY CONSULT NOTE   Patient ID: [name] MRN: [id number], DOB/AGE: 1940/05/08   Admit date: 12/02/2015 Date of Consult: 12/03/2015  Primary Physician: [name], MD Primary Cardiologist: Dr Sallyanne Kuster  Reason for consult:  Atrial fibrillation  Problem List  Past Medical History:  Diagnosis Date  . ABDOMINAL PAIN, LOWER 12/31/2009  . ANXIETY 12/31/2009  . Aortic insufficiency   . CORONARY ARTERY DISEASE 12/31/2009  . DEPRESSION 12/31/2009  . [place] DISEASE, CERVICAL 12/31/2009  . [place] DISEASE, LUMBAR 12/31/2009  . DIVERTICULITIS, HX OF 12/31/2009  . GLUCOSE INTOLERANCE 12/31/2009  . HYPERLIPIDEMIA 12/31/2009  . HYPERTENSION 12/31/2009  . MENOPAUSE, EARLY 12/31/2009  . MITRAL REGURGITATION 12/31/2009  . MITRAL VALVE PROLAPSE 12/31/2009  . NSTEMI (non-ST elevated myocardial infarction) ([place]) 05/11/2015  . OSTEOARTHRITIS, HAND 12/31/2009  . Pacemaker 01/21/2012   MDT Adapta dual chamber  . Persistent atrial fibrillation ([place]) 01/22/2012  . PVD (peripheral vascular disease), hx stents to bil SFAs 02/2010 01/22/2012  . Symptomatic sinus bradycardia 01/22/2012  . Tricuspid regurgitation   . Unspecified urinary incontinence 12/31/2009    Past Surgical History:  Procedure Laterality Date  . AUGMENTATION MAMMAPLASTY  1983  . CARDIAC CATHETERIZATION  2005   "negative" (01/21/2012)  . CARDIAC CATHETERIZATION  09/17/1999   Mild CAD involving proximal portion of LAD. Mitral valve prolapse w/ mitral regurg. No evidence of renal artery stenosis. Recommended readjustment of antihypertensive medications.  Marland Kitchen CARDIAC CATHETERIZATION N/A 01/01/2015   Procedure: Left Heart Cath and Coronary Angiography;  Surgeon: Jettie Booze, MD;  Location: [place] [facility];  Service: Cardiovascular;  Laterality: N/A;  . CARDIAC CATHETERIZATION N/A 05/12/2015   Procedure: Left Heart Cath and Coronary Angiography;  Surgeon: Troy Sine, MD;  Location: [place] [facility];  Service:  Cardiovascular;  Laterality: N/A;  . CARDIAC CATHETERIZATION N/A 05/12/2015   Procedure: Coronary Stent Intervention;  Surgeon: Troy Sine, MD;  Location: [place] [facility];  Service: Cardiovascular;  Laterality: N/A;  . CARDIAC SURGERY     11/20/20116  . CARDIOVERSION N/A 02/01/2015   Procedure: CARDIOVERSION;  Surgeon: Lelon Perla, MD;  Location: [facility] ENDOSCOPY;  Service: Cardiovascular;  Laterality: N/A;  . CARDIOVERSION N/A 08/30/2015   Procedure: CARDIOVERSION;  Surgeon: Sanda Klein, MD;  Location: [place] ENDOSCOPY;  Service: Cardiovascular;  Laterality: N/A;  . CARPAL TUNNEL RELEASE  2008   "right hand/thumb; carpal tunnel repair; got rid of arthritis" (01/21/2012)  . CLIPPING OF ATRIAL APPENDAGE N/A 01/04/2015   Procedure: CLIPPING OF ATRIAL APPENDAGE;  Surgeon: Grace Isaac, MD;  Location: [place];  Service: Open Heart Surgery;  Laterality: N/A;  . CORONARY ARTERY BYPASS GRAFT N/A 01/04/2015   Procedure: CORONARY ARTERY BYPASS GRAFTING (CABG) x 3 using left internal mammory artery and greater saphenous vein right leg harvested endoscopically.;  Surgeon: Grace Isaac, MD;  LIMA-LAD, SVG-RI, SVG-PDA  . FACELIFT, LOWER 2/3  1995   "mini" (01/21/2012)  . Lower Arterial Examination  10/28/2011   R. SFA stent mild-moderate mixed density plaque with elevated velocities consistent with 50% diameter reduction. L. SFA stent moderate mixed denisty plaque at mid to distal level consistent with 50-69% diameter reduction.  . OOPHORECTOMY  ~1979  . PACEMAKER INSERTION  01/21/2012   Medtronic Adapta, model# ADDRL1, serial# [id number]  . PARTIAL COLECTOMY  2010  . PERIPHERAL ARTERIAL STENT GRAFT  2012; 2012   "LLE; RLE" (01/21/2012)  . PERMANENT PACEMAKER INSERTION N/A 01/21/2012   Medtronic Adapta L implanted by Dr Sallyanne Kuster for tachy/brady syndrome  .  Persantine Myoview Stress Test  02/25/2010   No scintigraphic evidence of inducible myocardial ischemia. No persantine EKG changes.  Non-diagnositc for ishcemia. Low-risk, normal scan.  Marland Kitchen POSTERIOR CERVICAL LAMINECTOMY  1985  . TEE WITHOUT CARDIOVERSION N/A 01/04/2015   Procedure: TRANSESOPHAGEAL ECHOCARDIOGRAM (TEE);  Surgeon: Grace Isaac, MD;  Location: [place];  Service: Open Heart Surgery;  Laterality: N/A;  . TEE WITHOUT CARDIOVERSION N/A 02/01/2015   Procedure: TRANSESOPHAGEAL ECHOCARDIOGRAM (TEE);  Surgeon: Lelon Perla, MD;  Location: [facility] ENDOSCOPY;  Service: Cardiovascular;  Laterality: N/A;  . TEE WITHOUT CARDIOVERSION N/A 08/30/2015   Procedure: TRANSESOPHAGEAL ECHOCARDIOGRAM (TEE);  Surgeon: Sanda Klein, MD;  Location: [facility] ENDOSCOPY;  Service: Cardiovascular;  Laterality: N/A;  . VAGINAL HYSTERECTOMY  1975    Allergies  Allergies  Allergen Reactions  . Brilinta [Ticagrelor] Shortness Of Breath  . Clonidine Derivatives Other (See Comments)    Lowers heart rate  . Atorvastatin Other (See Comments)    Possible cause of fatigue/malaise  . Crestor [Rosuvastatin Calcium] Other (See Comments)    Joint/muscle aches  . Exforge [Amlodipine Besylate-Valsartan] Itching and Rash   HPI   [name] is a 75 y.o. female with CAD, symptomatic paroxysmal atrial fibrillation, s/p CABG x 3 and LAA clipping in 12/2014, symptomatic paroxysmal atrial fibrillation, followed by Dr Sallyanne Kuster  In the clinic, last seen on 10/17/2015. S/P NSTEMI in 05/2015, placement of stents both in the native right coronary artery (3.020 mm Synergy DES) and in the saphenous vein graft to the distal right coronary artery (2.7538 mm Synergy DES) due to early graft dysfunction. The saphenous vein graft to the ramus intermedius was also occluded, but this vessel was left for medical therapy.  S/P PM placement in 2013. She saw Dr. Rayann Heman on July 31 and started treatment with amiodarone. She felt rather poorly on the loading dose, with nausea and weakness, but feels better on the lower maintenance dose of 200 mg daily, she continued to have  neuropathic-like numbness and discomfort in both feet and in her right hand. Her metoprolol was increased for better rate control and spironolactone was added for markedly elevated blood pressure.  Labs performed on August 25 by her primary care doctor showed elevation in BUN and creatinine (38/1.46) as well as hypercalcemia (11.6) and hyperkalemia (5.8), furosemide was stopped. Repeat labs performed on September 5 showed improvement in renal function with a creatinine of 1.05 and BUN of 23. However the potassium was even more elevated at 6.3. She had the same calcium level and ionized calcium was also elevated at 6.0. Intact PTH was elevated at 83. There were borderline abnormalities in thyroid function with a slightly elevated TSH of 4.88 but also a slightly elevated free T4 of 1.13.Her blood pressure was low and she describes symptoms of orthostatic hypotension  at 130 pounds.  Pacemaker interrogation today shows normal device function. She has 99.8% atrial pacing and only 0.2% ventricular pacing. She has not had any atrial fibrillation since starting amiodarone. She has normal left ventricular systolic function. She does have evidence of grade 2 diastolic dysfunction on previous echo. She takes xarelto for paroxysmal atrial fibrillation. At the time of her bypass surgery she received an Atricure left atrial appendage clip. She is also on clopidogrel following her drug-eluting stents in April 2017. The pacemaker was implanted in 2013 for symptomatic sinus bradycardia. She also has a history of peripheral arterial disease and received bilateral superficial femoral artery stents about 3 years ago. She has never smoked but  drinks daily. She has treated hypertension.  The patient underwent nuclear stress test at [facility] on 11/02/2015 that was negative for ischemia. Report is available and care everywhere.  She presented today with headache, lightheadedness, and chest pain. She states that 2 days ago she woke  up with a severe frontal headache which lasted for several minutes and went away. She reports associated chest pain around the same time which was on the right side of her chest and also lasted several minutes. The severe headache came back yesterday and again lasted for several minutes and went away. She state it "felt like her head was going to explode". States her SBP>200. The patient states that she has been experiencing on and off chest pains on moderate exertion since April however day were significantly worse since last Saturday. No palpitations or syncope. No lower extremity edema orthopnea or proximal nocturnal dyspnea.   Inpatient Medications  . amiodarone  100 mg Oral Daily  . clopidogrel  75 mg Oral Daily  . hydrochlorothiazide  25 mg Oral Daily  . lisinopril  20 mg Oral Daily  . metoprolol  50 mg Oral Daily  . Rivaroxaban  15 mg Oral Q supper   Family History Family History  Problem Relation Age of Onset  . Heart disease Mother   . Hypertension Mother   . Stroke Mother   . Stroke Father   . Heart disease Father   . Heart disease Brother   . Hypertension Brother     2 brothers  . CVA Brother     2 brothers  . Heart attack Brother     2 brothers    Social History Social History   Social History  . Marital status: Widowed    Spouse name: N/A  . Number of children: 3  . Years of education: N/A   Occupational History  . retired Chemical engineer Retired   Social History Main Topics  . Smoking status: Former Smoker    Packs/day: 0.75    Years: 10.00    Types: Cigarettes  . Smokeless tobacco: Never Used     Comment: 01/21/2012 "quit smoking ~ 2002"  . Alcohol use Yes     Comment: 01/21/2012 "3 times/wk I have a couple mixed drinks"; 06/28/2015 "nothing in the last 3 weeks cause I haven't felt good", reduced recently  . Drug use: No  . Sexual activity: Not Currently   Other Topics Concern  . Not on file   Social History Narrative   Lives in [place].   Retired.    Review of Systems  General:  No chills, fever, night sweats or weight changes.  Cardiovascular:  No chest pain, dyspnea on exertion, edema, orthopnea, palpitations, paroxysmal nocturnal dyspnea. Dermatological: No rash, lesions/masses Respiratory: No cough, dyspnea Urologic: No hematuria, dysuria Abdominal:   No nausea, vomiting, diarrhea, bright red blood per rectum, melena, or hematemesis Neurologic:  No visual changes, wkns, changes in mental status. All other systems reviewed and are otherwise negative except as noted above.  Physical Exam  Blood pressure (!) 178/84, pulse 75, temperature 98.2 F (36.8 C), temperature source Oral, resp. rate 19, height 5\' 4"  (1.626 m), weight 141 lb 3.2 oz (64 kg), SpO2 97 %.  General: Pleasant, NAD Psych: Normal affect. Neuro: Alert and oriented X 3. Moves all extremities spontaneously. HEENT: Normal  Neck: Supple without bruits or JVD. Lungs:  Resp regular and unlabored, CTA. Heart: RRR no s3, s4, or murmurs. Abdomen: Soft, non-tender, non-distended, BS + x 4.  Extremities: No clubbing, cyanosis or edema. DP/PT/Radials 2+ and equal bilaterally.  Labs  Recent Labs  12/02/15 2241 12/03/15 0154 12/03/15 0412 12/03/15 0717  TROPONINI <0.03 <0.03 <0.03 <0.03   Lab Results  Component Value Date   WBC 4.3 12/02/2015   HGB 12.7 12/02/2015   HCT 39.0 12/02/2015   MCV 84.2 12/02/2015   PLT 248 12/02/2015    Recent Labs Lab 12/02/15 1523  NA 141  K 5.7*  CL 105  CO2 29  BUN 16  CREATININE 0.97  CALCIUM 10.9*  GLUCOSE 110*   Lab Results  Component Value Date   CHOL 158 05/12/2015   HDL 64 05/12/2015   LDLCALC 71 05/12/2015   TRIG 114 05/12/2015   Radiology/Studies  Dg Chest 2 View  Result Date: 12/02/2015 CLINICAL DATA:  Chest pain, shortness of breath EXAM: CHEST  2 VIEW COMPARISON:  CT chest 08/26/2015 FINDINGS: There is no focal parenchymal opacity. There is no pleural effusion or pneumothorax. There is  mild stable cardiomegaly. There is a dual lead AICD. There is evidence of prior CABG. The osseous structures are unremarkable. IMPRESSION: No active cardiopulmonary disease. Electronically Signed   By: Kathreen Devoid   On: 12/02/2015 16:00   Ct Head Wo Contrast  Result Date: 12/02/2015 CLINICAL DATA:  Initial evaluation for acute headache. IMPRESSION: 1. No acute intracranial process. 2. Generalized age-related cerebral atrophy with moderate chronic microvascular ischemic disease. Electronically Signed   By: Jeannine Boga M.D.   On: 12/02/2015 20:50   Echocardiogram   ECG: a-paced rhythm     ASSESSMENT AND PLAN  1. Hypertensive urgency, with difficult to control blood pressure as she has several medication intolerances, the patient is intolerant to amlodipine, clonidine, spironolactone. She is currently on lisinopril 20 mg daily hydrochlorothiazide 25 mg daily, and Toprol-XL 50 mg daily. I would discontinue Toprol-XL and start carvedilol 25 mg by mouth twice a day. I would further increase lisinopril to 40 mg daily if needed tomorrow however disc increases the risk of further hyperkalemia. 2. Hyperkalemia: Spironolactone was discontinued, she has received Kayexalate. We will follow. 3. Chronic diastolic CHF: Appears euvolemic. BNP elevated but it has been elevated on the last 4 visits. 4. CAD, status post CABG 3 in November 2016 and placement of 2 stents in April 2017 in the settings of non-STEMI please see details about. She had a stress test the [facility] on 11/02/2015 that was negative for ischemia. I'm assuming that her chest pain is related to her hypertensive urgency, however if her blood pressure well controlled and she continues to have worsening chest pain cardiac Can be considered. 5. HLP: Not sure whether her weakness was truly statin related. Current lipid profile shows unacceptably elevated levels of LDL cholesterol. Plan to restart a statin once the rest of her metabolic problems are  compensated. 6. SSS: Her pacemaker shows good distribution of heart rate histogram.  7. AFib: Very low burden of arrhythmia. She has an atrial appendage clip and is on Xarelto also. She has neuropathic symptoms that might be related to amiodarone. Currently on amiodarone 100 mg daily. 8. PPM: Normal device function. Check total months ago. 9. PAD: Stable. 10. Hypercalcemia: Labs suggest she has primary hyperparathyroidism. The hypercalcemia may explain some of her fatigue. Probably should see an endocrinologist.   Signed, Ena Dawley, MD, [facility] 12/03/2015, 4:31 PM

## 2015-12-03 NOTE — Progress Notes (Addendum)
[facility] PROGRESS NOTE  [name] [id number] DOB: 1940/12/01 DOA: 12/02/2015   PCP: [name], MD     Assessment/Plan: Active Problems:   Essential hypertension   CAD (coronary artery disease)   Atrial fibrillation with RVR (HCC)   Chest pain   Chronic diastolic heart failure ([place])  75 y.o. female who presents with headache, lightheadedness, and chest pain. PMH significant for hx of MI and CAD s/p CABG and stenting, PAF on Xarelto and amiodarone, diastolic CHF EF [id number], s/p dual pacer, sinus node dysfunction, mitral valve regurg and prolapse, PVD, HTN, dyslipidemia, hyperkalemia, hypercalcemia with probable hyperparathyroidism, chronic fatigue. She states that 2 days ago she woke up with a severe frontal headache which lasted for several minutes and went away. She reports associated chest pain around the same time which was on the right side of her chest and also lasted several minutes. The severe headache came back yesterday and again lasted for several minutes and went away. She state it "felt like her head was going to explode". States her SBP>200   . Her fiancee is 75 years old and has 8 children, 2 of which are teenagers which she helps care for. Cardiologist is Dr. Sallyanne Kuster. Last OV on 9/7. Pacer was checked on 9/7 which was unremarkable. TEE on 7/21 showed EF 50-55%. Last cath was 4/02 which showed significant disease and was stented at that time.  BMP is remarkable for K of 5.7 and hypercalcemia of 10.9.     1. Hyperkalemia: Potassium levels 5.7  and  Recently dc'd [employer]ol[employer]  ace inhibitors repeat BMP today.received kayexalate last night.Resume lisiniopril and add HCTZ for uncontrolled BP 2. Chronic diastolic CHF: Seems to be "dry" at 130 pounds and now 141,  3. CAD: Currently without symptoms of angina pectoris. Unfortunate she had early failure over 2 saphenous vein grafts, one of which was treated with a drug-eluting stent. Her mammary  artery bypass was widely patent. The difference in blood pressure between her upper extremity suggests she may have left subclavian stenosis and will evaluate this with ultrasonography. Preserved left ventricular systolic function.Continue antiplatelet therapy with recently placed drug-eluting stents (clopidogrel at least through April 2018).Patient requested to see cards this admission 4. HLP: Not sure whether her weakness was truly statin related. Current lipid profile shows unacceptably elevated levels of LDL cholesterol.   5. SSS: Her pacemaker shows good distribution of heart rate histogram.  6. AFib: Very low burden of arrhythmia. She has an atrial appendage clip and is on Xarelto also. She has neuropathic symptoms that might be related to amiodarone. Cardiologist in [facility] reduced dose to 100 mg daily 7. PPM: Normal recent device function.  8. PAD: She describes fatigue rather than intermittent claudication. Bilateral lower extremity Doppler study showed normal ABIs and waveforms in both extremities.  9.  Hypertensive urgency .  Check BP  in the right upper extremity only. Added HCTZ to ACE  10. Hypercalcemia: Labs suggest she has primary hyperparathyroidism. The hypercalcemia may explain some of her fatigue. Probably should see an endocrinologist.     DVT prophylaxsis xarelto  Code Status:  Full code      Family Communication: Discussed in detail with the patient, all imaging results, lab results explained to the patient   Disposition Plan:  Cards eval       Consultants:  cardiology  Procedures:  None   Antibiotics: Anti-infectives    None  HPI/Subjective: Chest pain free , sx in the setting of HIGH BP  Objective: Vitals:   12/03/15 0112 12/03/15 0457 12/03/15 0753 12/03/15 1156  BP: (!) 159/91 (!) 166/70 (!) 156/74 (!) 154/62  Pulse: 80 74 77 76  Resp: 20 20  19   Temp: 97.9 F (36.6 C) 97.9 F (36.6 C)  97.5 F (36.4 C)  TempSrc: Oral Oral  Oral   SpO2: 100% 96% 96% 98%  Weight: 64 kg (141 lb 3.2 oz)     Height: 5\' 4"  (1.626 m)       Intake/Output Summary (Last 24 hours) at 12/03/15 1301 Last data filed at 12/02/15 2147  Gross per 24 hour  Intake              500 ml  Output                0 ml  Net              500 ml    Exam:  Examination:  General exam: Appears calm and comfortable  Respiratory system: Clear to auscultation. Respiratory effort normal. Cardiovascular system: S1 & S2 heard, RRR. No JVD, murmurs, rubs, gallops or clicks. No pedal edema. Gastrointestinal system: Abdomen is nondistended, soft and nontender. No organomegaly or masses felt. Normal bowel sounds heard. Central nervous system: Alert and oriented. No focal neurological deficits. Extremities: Symmetric 5 x 5 power. Skin: No rashes, lesions or ulcers Psychiatry: Judgement and insight appear normal. Mood & affect appropriate.     Data Reviewed: I have personally reviewed following labs and imaging studies  Micro Results No results found for this or any previous visit (from the past 240 hour(s)).  Radiology Reports Dg Chest 2 View  Result Date: 12/02/2015 CLINICAL DATA:  Chest pain, shortness of breath EXAM: CHEST  2 VIEW COMPARISON:  CT chest 08/26/2015 FINDINGS: There is no focal parenchymal opacity. There is no pleural effusion or pneumothorax. There is mild stable cardiomegaly. There is a dual lead AICD. There is evidence of prior CABG. The osseous structures are unremarkable. IMPRESSION: No active cardiopulmonary disease. Electronically Signed   By: Kathreen Devoid   On: 12/02/2015 16:00   Ct Head Wo Contrast  Result Date: 12/02/2015 CLINICAL DATA:  Initial evaluation for acute headache. EXAM: CT HEAD WITHOUT CONTRAST TECHNIQUE: Contiguous axial images were obtained from the base of the skull through the vertex without intravenous contrast. COMPARISON:  None. FINDINGS: Brain: Generalized cerebral atrophy with chronic microvascular ischemic  disease. No acute intracranial hemorrhage. No evidence for acute large vessel territory infarct. No mass lesion, midline shift or mass effect. No hydrocephalus. No extra-axial fluid collection. Vascular: No hyperdense vessel. Scattered vascular calcifications noted. Skull: Scalp soft tissues within normal limits.  Calvarium intact. Sinuses/Orbits: Globes and orbital soft tissues within normal limits. Patient is status post lens extraction on the left. Paranasal sinuses and mastoid air cells are clear. IMPRESSION: 1. No acute intracranial process. 2. Generalized age-related cerebral atrophy with moderate chronic microvascular ischemic disease. Electronically Signed   By: Jeannine Boga M.D.   On: 12/02/2015 20:50     CBC  Recent Labs Lab 12/02/15 1523  WBC 4.3  HGB 12.7  HCT 39.0  PLT 248  MCV 84.2  MCH 27.4  MCHC 32.6  RDW 19.7*    Chemistries   Recent Labs Lab 12/02/15 1523  NA 141  K 5.7*  CL 105  CO2 29  GLUCOSE 110*  BUN 16  CREATININE 0.97  CALCIUM 10.9*   ------------------------------------------------------------------------------------------------------------------  estimated creatinine clearance is 43.3 mL/min (by C-G formula based on SCr of 0.97 mg/dL). ------------------------------------------------------------------------------------------------------------------ No results for input(s): HGBA1C in the last 72 hours. ------------------------------------------------------------------------------------------------------------------ No results for input(s): CHOL, HDL, LDLCALC, TRIG, CHOLHDL, LDLDIRECT in the last 72 hours. ------------------------------------------------------------------------------------------------------------------ No results for input(s): TSH, T4TOTAL, T3FREE, THYROIDAB in the last 72 hours.  Invalid input(s): FREET3 ------------------------------------------------------------------------------------------------------------------ No  results for input(s): VITAMINB12, FOLATE, FERRITIN, TIBC, IRON, RETICCTPCT in the last 72 hours.  Coagulation profile No results for input(s): INR, PROTIME in the last 168 hours.  No results for input(s): DDIMER in the last 72 hours.  Cardiac Enzymes  Recent Labs Lab 12/03/15 0154 12/03/15 0412 12/03/15 0717  TROPONINI <0.03 <0.03 <0.03   ------------------------------------------------------------------------------------------------------------------ Invalid input(s): POCBNP   CBG:  Recent Labs Lab 12/02/15 2250  GLUCAP 112*       Studies: Dg Chest 2 View  Result Date: 12/02/2015 CLINICAL DATA:  Chest pain, shortness of breath EXAM: CHEST  2 VIEW COMPARISON:  CT chest 08/26/2015 FINDINGS: There is no focal parenchymal opacity. There is no pleural effusion or pneumothorax. There is mild stable cardiomegaly. There is a dual lead AICD. There is evidence of prior CABG. The osseous structures are unremarkable. IMPRESSION: No active cardiopulmonary disease. Electronically Signed   By: Kathreen Devoid   On: 12/02/2015 16:00   Ct Head Wo Contrast  Result Date: 12/02/2015 CLINICAL DATA:  Initial evaluation for acute headache. EXAM: CT HEAD WITHOUT CONTRAST TECHNIQUE: Contiguous axial images were obtained from the base of the skull through the vertex without intravenous contrast. COMPARISON:  None. FINDINGS: Brain: Generalized cerebral atrophy with chronic microvascular ischemic disease. No acute intracranial hemorrhage. No evidence for acute large vessel territory infarct. No mass lesion, midline shift or mass effect. No hydrocephalus. No extra-axial fluid collection. Vascular: No hyperdense vessel. Scattered vascular calcifications noted. Skull: Scalp soft tissues within normal limits.  Calvarium intact. Sinuses/Orbits: Globes and orbital soft tissues within normal limits. Patient is status post lens extraction on the left. Paranasal sinuses and mastoid air cells are clear. IMPRESSION:  1. No acute intracranial process. 2. Generalized age-related cerebral atrophy with moderate chronic microvascular ischemic disease. Electronically Signed   By: Jeannine Boga M.D.   On: 12/02/2015 20:50      Lab Results  Component Value Date   HGBA1C 6.4 (H) 01/31/2015   HGBA1C 6.2 (H) 01/04/2015   Lab Results  Component Value Date   LDLCALC 71 05/12/2015   CREATININE 0.97 12/02/2015       Scheduled Meds: . amiodarone  100 mg Oral Daily  . clopidogrel  75 mg Oral Daily  . hydrochlorothiazide  25 mg Oral Daily  . lisinopril  20 mg Oral Daily  . metoprolol  50 mg Oral Daily  . Rivaroxaban  15 mg Oral Q supper   Continuous Infusions:    LOS: 0 days    Time spent: >30 MINS    [facility]  [facility] Pager [phone]. If 7PM-7AM, please contact night-coverage at [email or web address], password [facility] 12/03/2015, 1:01 PM  LOS: 0 days

## 2015-12-04 ENCOUNTER — Observation Stay (HOSPITAL_BASED_OUTPATIENT_CLINIC_OR_DEPARTMENT_OTHER): Payer: Commercial Managed Care - HMO

## 2015-12-04 DIAGNOSIS — R079 Chest pain, unspecified: Secondary | ICD-10-CM | POA: Diagnosis not present

## 2015-12-04 DIAGNOSIS — R519 Headache, unspecified: Secondary | ICD-10-CM

## 2015-12-04 DIAGNOSIS — E875 Hyperkalemia: Secondary | ICD-10-CM | POA: Diagnosis not present

## 2015-12-04 DIAGNOSIS — I10 Essential (primary) hypertension: Secondary | ICD-10-CM | POA: Diagnosis not present

## 2015-12-04 DIAGNOSIS — Z9229 Personal history of other drug therapy: Secondary | ICD-10-CM

## 2015-12-04 DIAGNOSIS — I4891 Unspecified atrial fibrillation: Secondary | ICD-10-CM | POA: Diagnosis not present

## 2015-12-04 DIAGNOSIS — R51 Headache: Secondary | ICD-10-CM

## 2015-12-04 DIAGNOSIS — I2510 Atherosclerotic heart disease of native coronary artery without angina pectoris: Secondary | ICD-10-CM | POA: Diagnosis not present

## 2015-12-04 LAB — CBC
HCT: 36.5 % (ref 36.0–46.0)
Hemoglobin: 12 g/dL (ref 12.0–15.0)
MCH: 27.3 pg (ref 26.0–34.0)
MCHC: 32.9 g/dL (ref 30.0–36.0)
MCV: 83.1 fL (ref 78.0–100.0)
Platelets: 222 10*3/uL (ref 150–400)
RBC: 4.39 MIL/uL (ref 3.87–5.11)
RDW: 19.6 % — ABNORMAL HIGH (ref 11.5–15.5)
WBC: 5.3 10*3/uL (ref 4.0–10.5)

## 2015-12-04 LAB — COMPREHENSIVE METABOLIC PANEL
ALT: 19 U/L (ref 14–54)
AST: 22 U/L (ref 15–41)
Albumin: 3.4 g/dL — ABNORMAL LOW (ref 3.5–5.0)
Alkaline Phosphatase: 95 U/L (ref 38–126)
Anion gap: 8 (ref 5–15)
BUN: 12 mg/dL (ref 6–20)
CO2: 29 mmol/L (ref 22–32)
Calcium: 10.3 mg/dL (ref 8.9–10.3)
Chloride: 102 mmol/L (ref 101–111)
Creatinine, Ser: 0.92 mg/dL (ref 0.44–1.00)
GFR calc Af Amer: >60 mL/min (ref >60)
GFR calc non Af Amer: 59 mL/min — ABNORMAL LOW (ref >60)
Glucose, Bld: 121 mg/dL — ABNORMAL HIGH (ref 65–99)
Potassium: 4.4 mmol/L (ref 3.5–5.1)
Sodium: 139 mmol/L (ref 135–145)
Total Bilirubin: 0.8 mg/dL (ref 0.3–1.2)
Total Protein: 5.9 g/dL — ABNORMAL LOW (ref 6.5–8.1)

## 2015-12-04 LAB — ECHOCARDIOGRAM COMPLETE
Height: 64 in
Weight: 2259.2 oz

## 2015-12-04 LAB — CALCIUM, IONIZED: Calcium, Ionized, Serum: 5.5 mg/dL (ref 4.5–5.6)

## 2015-12-04 MED ORDER — HYDROCHLOROTHIAZIDE 25 MG PO TABS
25.0000 mg | ORAL_TABLET | Freq: Every day | ORAL | 1 refills | Status: DC
Start: 2015-12-05 — End: 2015-12-04

## 2015-12-04 MED ORDER — CARVEDILOL 25 MG PO TABS: 25.0000 mg | ORAL_TABLET | Freq: Two times a day (BID) | ORAL | 1 refills | Status: DC

## 2015-12-04 MED ORDER — HYDROCHLOROTHIAZIDE 25 MG PO TABS
25.0000 mg | ORAL_TABLET | Freq: Every day | ORAL | 1 refills | Status: DC
Start: 2015-12-05 — End: 2015-12-09

## 2015-12-04 NOTE — Progress Notes (Signed)
  Echocardiogram 2D Echocardiogram has been performed.  Tresa Res 12/04/2015, 12:21 PM

## 2015-12-04 NOTE — Discharge Summary (Signed)
Physician Discharge Summary  [name] MRN: [id number] DOB/AGE: March 08, 1940 75 y.o.  PCP: [name], MD   Admit date: 12/02/2015 Discharge date: 12/04/2015  Discharge Diagnoses:    Active Problems:   Hyperlipidemia   Essential hypertension   PAF (paroxysmal atrial fibrillation) (HCC)   S/P placement of cardiac pacemaker, medtronic adapta 01/21/12   CAD (coronary artery disease)   S/P CABG x 3 01/04/15   Atrial fibrillation with RVR (HCC)   Chest pain   Chronic diastolic heart failure (HCC)   Hypercalcemia   Hyperkalemia   H/O amiodarone therapy   Headache    Follow-up recommendations Follow-up with PCP in 3-5 days , including all  additional recommended appointments as below Follow-up CBC, CMP in 3-5 days follow-up by Dr. Sallyanne Kuster within the next 7-10 days       Medication List    STOP taking these medications   metoprolol 50 MG tablet Commonly known as:  LOPRESSOR     TAKE these medications   amiodarone 100 MG tablet Commonly known as:  PACERONE Take 1 tablet (100 mg total) by mouth daily.   carvedilol 25 MG tablet Commonly known as:  COREG Take 1 tablet (25 mg total) by mouth 2 (two) times daily with a meal.   cholecalciferol 1000 units tablet Commonly known as:  VITAMIN D Take 1,000 Units by mouth daily.   citalopram 20 MG tablet Commonly known as:  CELEXA Take 1 tablet (20 mg total) by mouth daily. What changed:  how much to take   clopidogrel 75 MG tablet Commonly known as:  PLAVIX TAKE 1 TABLET (75 MG TOTAL) BY MOUTH DAILY.   COQ10 PO Take 120 mg by mouth daily.   hydrochlorothiazide 25 MG tablet Commonly known as:  HYDRODIURIL Take 1 tablet (25 mg total) by mouth daily. Start taking on:  12/05/2015   lisinopril 20 MG tablet Commonly known as:  PRINIVIL,ZESTRIL Take 1 tablet (20 mg total) by mouth daily. What changed:  how much to take   multivitamin capsule Take 1 capsule by mouth daily.   nitroGLYCERIN 0.4 MG SL  tablet Commonly known as:  NITROSTAT Place 1 tablet (0.4 mg total) under the tongue every 5 (five) minutes x 3 doses as needed for chest pain.   Rivaroxaban 15 MG Tabs tablet Commonly known as:  XARELTO Take 1 tablet (15 mg total) by mouth daily with supper.          Discharge Condition: Stable Discharge Instructions Get Medicines reviewed and adjusted: Please take all your medications with you for your next visit with your Primary MD  Please request your Primary MD to go over all hospital tests and procedure/radiological results at the follow up, please ask your Primary MD to get all Hospital records sent to his/her office.  If you experience worsening of your admission symptoms, develop shortness of breath, life threatening emergency, suicidal or homicidal thoughts you must seek medical attention immediately by calling 911 or calling your MD immediately if symptoms less severe.  You must read complete instructions/literature along with all the possible adverse reactions/side effects for all the Medicines you take and that have been prescribed to you. Take any new Medicines after you have completely understood and accpet all the possible adverse reactions/side effects.   Do not drive when taking Pain medications.   Do not take more than prescribed Pain, Sleep and Anxiety Medications  Special Instructions: If you have smoked or chewed Tobacco in the last 2 yrs please stop smoking,  stop any regular Alcohol and or any Recreational drug use.  Wear Seat belts while driving.  Please note  You were cared for by a hospitalist during your hospital stay. Once you are discharged, your primary care physician will handle any further medical issues. Please note that NO REFILLS for any discharge medications will be authorized once you are discharged, as it is imperative that you return to your primary care physician (or establish a relationship with a primary care physician if you do not have  one) for your aftercare needs so that they can reassess your need for medications and monitor your lab values.  Discharge Instructions    Diet - low sodium heart healthy    Complete by:  As directed    Increase activity slowly    Complete by:  As directed        Allergies  Allergen Reactions  . Brilinta [Ticagrelor] Shortness Of Breath  . Clonidine Derivatives Other (See Comments)    Lowers heart rate  . Atorvastatin Other (See Comments)    Possible cause of fatigue/malaise  . Crestor [Rosuvastatin Calcium] Other (See Comments)    Joint/muscle aches  . Exforge [Amlodipine Besylate-Valsartan] Itching and Rash      Disposition: 01-Home or Self Care   Consults:  Cardiology    Significant Diagnostic Studies:  Dg Chest 2 View  Result Date: 12/02/2015 CLINICAL DATA:  Chest pain, shortness of breath EXAM: CHEST  2 VIEW COMPARISON:  CT chest 08/26/2015 FINDINGS: There is no focal parenchymal opacity. There is no pleural effusion or pneumothorax. There is mild stable cardiomegaly. There is a dual lead AICD. There is evidence of prior CABG. The osseous structures are unremarkable. IMPRESSION: No active cardiopulmonary disease. Electronically Signed   By: Kathreen Devoid   On: 12/02/2015 16:00   Ct Head Wo Contrast  Result Date: 12/02/2015 CLINICAL DATA:  Initial evaluation for acute headache. EXAM: CT HEAD WITHOUT CONTRAST TECHNIQUE: Contiguous axial images were obtained from the base of the skull through the vertex without intravenous contrast. COMPARISON:  None. FINDINGS: Brain: Generalized cerebral atrophy with chronic microvascular ischemic disease. No acute intracranial hemorrhage. No evidence for acute large vessel territory infarct. No mass lesion, midline shift or mass effect. No hydrocephalus. No extra-axial fluid collection. Vascular: No hyperdense vessel. Scattered vascular calcifications noted. Skull: Scalp soft tissues within normal limits.  Calvarium intact. Sinuses/Orbits:  Globes and orbital soft tissues within normal limits. Patient is status post lens extraction on the left. Paranasal sinuses and mastoid air cells are clear. IMPRESSION: 1. No acute intracranial process. 2. Generalized age-related cerebral atrophy with moderate chronic microvascular ischemic disease. Electronically Signed   By: Jeannine Boga M.D.   On: 12/02/2015 20:50        Filed Weights   12/02/15 1518 12/03/15 0112  Weight: 59 kg (130 lb) 64 kg (141 lb 3.2 oz)     Microbiology: No results found for this or any previous visit (from the past 240 hour(s)).     Blood Culture    Component Value Date/Time   SDES BLOOD RIGHT HAND 06/28/2015 0110   SPECREQUEST IN PEDIATRIC BOTTLE 3ML 06/28/2015 0110   CULT NO GROWTH 5 DAYS 06/28/2015 0110   REPTSTATUS 07/03/2015 FINAL 06/28/2015 0110      Labs: Results for orders placed or performed during the hospital encounter of 12/02/15 (from the past 48 hour(s))  Basic metabolic panel     Status: Abnormal   Collection Time: 12/02/15  3:23 PM  Result Value Ref  Range   Sodium 141 135 - 145 mmol/L   Potassium 5.7 (H) 3.5 - 5.1 mmol/L   Chloride 105 101 - 111 mmol/L   CO2 29 22 - 32 mmol/L   Glucose, Bld 110 (H) 65 - 99 mg/dL   BUN 16 6 - 20 mg/dL   Creatinine, Ser 0.97 0.44 - 1.00 mg/dL   Calcium 10.9 (H) 8.9 - 10.3 mg/dL   GFR calc non Af Amer 56 (L) >60 mL/min   GFR calc Af Amer >60 >60 mL/min    Comment: (NOTE) The eGFR has been calculated using the CKD EPI equation. This calculation has not been validated in all clinical situations. eGFR's persistently <60 mL/min signify possible Chronic Kidney Disease.    Anion gap 7 5 - 15  CBC     Status: Abnormal   Collection Time: 12/02/15  3:23 PM  Result Value Ref Range   WBC 4.3 4.0 - 10.5 K/uL   RBC 4.63 3.87 - 5.11 MIL/uL   Hemoglobin 12.7 12.0 - 15.0 g/dL   HCT 39.0 36.0 - 46.0 %   MCV 84.2 78.0 - 100.0 fL   MCH 27.4 26.0 - 34.0 pg   MCHC 32.6 30.0 - 36.0 g/dL   RDW 19.7  (H) 11.5 - 15.5 %   Platelets 248 150 - 400 K/uL  I-stat troponin, ED     Status: None   Collection Time: 12/02/15  3:45 PM  Result Value Ref Range   Troponin i, poc 0.00 0.00 - 0.08 ng/mL   Comment 3            Comment: Due to the release kinetics of cTnI, a negative result within the first hours of the onset of symptoms does not rule out myocardial infarction with certainty. If myocardial infarction is still suspected, repeat the test at appropriate intervals.   Brain natriuretic peptide     Status: Abnormal   Collection Time: 12/02/15  7:17 PM  Result Value Ref Range   B Natriuretic Peptide 499.0 (H) 0.0 - 100.0 pg/mL  Troponin I     Status: None   Collection Time: 12/02/15 10:41 PM  Result Value Ref Range   Troponin I <0.03 <0.03 ng/mL  Glucose, capillary     Status: Abnormal   Collection Time: 12/02/15 10:50 PM  Result Value Ref Range   Glucose-Capillary 112 (H) 65 - 99 mg/dL  Troponin I-serum (0, 3, 6 hours)     Status: None   Collection Time: 12/03/15  1:54 AM  Result Value Ref Range   Troponin I <0.03 <0.03 ng/mL  Troponin I-serum (0, 3, 6 hours)     Status: None   Collection Time: 12/03/15  4:12 AM  Result Value Ref Range   Troponin I <0.03 <0.03 ng/mL  Troponin I-serum (0, 3, 6 hours)     Status: None   Collection Time: 12/03/15  7:17 AM  Result Value Ref Range   Troponin I <0.03 <0.03 ng/mL  Basic metabolic panel     Status: Abnormal   Collection Time: 12/03/15  4:26 PM  Result Value Ref Range   Sodium 137 135 - 145 mmol/L   Potassium 3.8 3.5 - 5.1 mmol/L    Comment: DELTA CHECK NOTED   Chloride 103 101 - 111 mmol/L   CO2 28 22 - 32 mmol/L   Glucose, Bld 90 65 - 99 mg/dL   BUN 7 6 - 20 mg/dL   Creatinine, Ser 0.92 0.44 - 1.00 mg/dL   Calcium 10.5 (  H) 8.9 - 10.3 mg/dL   GFR calc non Af Amer 59 (L) >60 mL/min   GFR calc Af Amer >60 >60 mL/min    Comment: (NOTE) The eGFR has been calculated using the CKD EPI equation. This calculation has not been  validated in all clinical situations. eGFR's persistently <60 mL/min signify possible Chronic Kidney Disease.    Anion gap 6 5 - 15  CBC     Status: Abnormal   Collection Time: 12/04/15  3:21 AM  Result Value Ref Range   WBC 5.3 4.0 - 10.5 K/uL   RBC 4.39 3.87 - 5.11 MIL/uL   Hemoglobin 12.0 12.0 - 15.0 g/dL   HCT 36.5 36.0 - 46.0 %   MCV 83.1 78.0 - 100.0 fL   MCH 27.3 26.0 - 34.0 pg   MCHC 32.9 30.0 - 36.0 g/dL   RDW 19.6 (H) 11.5 - 15.5 %   Platelets 222 150 - 400 K/uL  Comprehensive metabolic panel     Status: Abnormal   Collection Time: 12/04/15  3:21 AM  Result Value Ref Range   Sodium 139 135 - 145 mmol/L   Potassium 4.4 3.5 - 5.1 mmol/L   Chloride 102 101 - 111 mmol/L   CO2 29 22 - 32 mmol/L   Glucose, Bld 121 (H) 65 - 99 mg/dL   BUN 12 6 - 20 mg/dL   Creatinine, Ser 0.92 0.44 - 1.00 mg/dL   Calcium 10.3 8.9 - 10.3 mg/dL   Total Protein 5.9 (L) 6.5 - 8.1 g/dL   Albumin 3.4 (L) 3.5 - 5.0 g/dL   AST 22 15 - 41 U/L   ALT 19 14 - 54 U/L   Alkaline Phosphatase 95 38 - 126 U/L   Total Bilirubin 0.8 0.3 - 1.2 mg/dL   GFR calc non Af Amer 59 (L) >60 mL/min   GFR calc Af Amer >60 >60 mL/min    Comment: (NOTE) The eGFR has been calculated using the CKD EPI equation. This calculation has not been validated in all clinical situations. eGFR's persistently <60 mL/min signify possible Chronic Kidney Disease.    Anion gap 8 5 - 15     Lipid Panel     Component Value Date/Time   CHOL 158 05/12/2015 0324   TRIG 114 05/12/2015 0324   HDL 64 05/12/2015 0324   CHOLHDL 2.5 05/12/2015 0324   VLDL 23 05/12/2015 0324   LDLCALC 71 05/12/2015 0324     Lab Results  Component Value Date   HGBA1C 6.4 (H) 01/31/2015   HGBA1C 6.2 (H) 01/04/2015        HPI :  [name] a 75 y.o.femalewith CAD, symptomatic paroxysmal atrial fibrillation, s/p CABG x 3 and LAA clipping in 12/2014, symptomatic paroxysmal atrial fibrillation, followed by Dr Sallyanne Kuster  In the clinic,  last seen on 10/17/2015. S/P NSTEMI in 05/2015, placement of stents both in the native right coronary artery (3.020 mm Synergy DES) and in the saphenous vein graft to the distal right coronary artery (2.7538 mm Synergy DES) due to early graft dysfunction. The saphenous vein graft to the ramus intermedius was also occluded, but this vessel was left for medical therapy.  S/P PM placement in 2013. She saw Dr. Rayann Heman on July 31 and started treatment with amiodarone. She felt rather poorly on the loading dose, with nausea and weakness, but feels better on the lower maintenance dose of 200 mg daily, she continued to have neuropathic-like numbness and discomfort in both feet and in  her right hand. Her metoprolol was increased for better rate control and spironolactone was added for markedly elevated blood pressure.  Labs performed on August 25 by her primary care doctor showed elevation in BUN and creatinine (38/1.46) as well as hypercalcemia (11.6) and hyperkalemia (5.8), furosemide was stopped. Repeat labs performed on September 5 showed improvement in renal function with a creatinine of 1.05 and BUN of 23. However the potassium was even more elevated at 6.3. She had the same calcium level and ionized calcium was also elevated at 6.0. Intact PTH was elevated at 83. There were borderline abnormalities in thyroid function with a slightly elevated TSH of 4.88 but also a slightly elevated free T4 of 1.13.Her blood pressure was low and she describes symptoms of orthostatic hypotension at130 pounds.  Pacemaker interrogation today shows normal device function. She has 99.8% atrial pacing and only 0.2% ventricular pacing. She has not had any atrial fibrillation since starting amiodarone. She has normal left ventricular systolic function. She does have evidence of grade 2 diastolic dysfunction on previous echo. She takes xarelto for paroxysmal atrial fibrillation. At the time of her bypass surgery she received an  Atricure left atrial appendage clip. She is also onclopidogrelfollowing her drug-eluting stents in April 2017. The pacemaker was implanted in 2013 for symptomatic sinus bradycardia. She also has a history of peripheral arterial disease and received bilateral superficial femoral artery stents about 3 years ago. She has never smoked but drinks daily. She has treated hypertension.  The patient underwent nuclear stress test at [facility] on 11/02/2015 that was negative for ischemia. Report is available and care everywhere.  She presented today with headache, lightheadedness, and chest pain. She states that 2 days ago she woke up with a severe frontal headache which lasted for several minutes and went away. She reports associated chest pain around the same time which was on the right side of her chest and also lasted several minutes. The severe headache came back yesterday and again lasted for several minutes and went away. She state it "felt like her head was going to explode". States her SBP>200. The patient states that she has been experiencing on and off chest pains on moderate exertion since April however day were significantly worse since last Saturday. No palpitations or syncope. No lower extremity edema orthopnea or proximal nocturnal dyspnea.   HOSPITAL COURSE:    1. Hyperkalemia: Potassium levels 5.7 on admission  and  Recently dc'd spironolactone she.received kayexalate repeat potassium within normal limits.Resume lisiniopril and add HCTZ for uncontrolled BP 2. Chronic diastolic CHF: Without exacerbation 3. CAD:  status post CABG 3 in November 2016 and placement of 2 stents in April 2017 in the settings of non-STEMI please see details about. She had a stress test the [facility] on 11/02/2015 that was negative for ischemia. According to cardiology chest pain is related to her hypertensive urgency, however if her blood pressure well controlled and she continues to have worsening chest pain , ischemia workup  could be considered. Continueantiplatelet therapy with recently placed drug-eluting stents (clopidogrel at least through April 2018).seen by cardiology prior to discharge-no definite evidence of coronary ischemia per Dr. Ron Parker 4. HLP: Not sure whether her weakness was truly statin related.Most recent lipid profile shows an LDL of 71 . 5. S/P placement of cardiac pacemaker, medtronic adapta 01/21/12.     At this time she is atrial pacing 6. AFib: Very low burden of arrhythmia. She has an atrial appendage clip and is on Xarelto also. She has  neuropathic symptoms that might be related to amiodarone. Cardiologist in [facility] reduced dose to 100 mg daily 7. PPM: Normal recent device function.  8. PAD: She describes fatigue rather than intermittent claudication. Bilateral lower extremity Doppler study showed normal ABIs and waveforms in both extremities. 9. Hypertensive urgency, with difficult to control blood pressure as she has several medication intolerances, the patient is intolerant to amlodipine, clonidine, spironolactone. She is currently on lisinopril 20 mg daily , we added hydrochlorothiazide 25 mg daily to balance out her potassium and she had presented with hyperkalemia. Cardiology recommended to discontinue Toprol-XL and start carvedilol 25 mg by mouth twice a day. Consider increasing lisinopril to 40 mg daily if blood pressure still not controlled and no hyperkalemia develops in the outpatient setting. 10. Hypercalcemia: Hypercalcemia is borderline, would benefit from being evaluated for hyperparathyroidism   Discharge Exam  Blood pressure (!) 147/74, pulse 73, temperature 98.4 F (36.9 C), temperature source Oral, resp. rate 14, height '5\' 4"'  (1.626 m), weight 64 kg (141 lb 3.2 oz), SpO2 95 %.      Follow-up Information    [name], MD. Schedule an appointment as soon as possible for a visit in 2 day(s).   Specialty:  Family Medicine Why:  Hospital follow-up Contact  information: [place] [id number] [phone]        Sanda Klein, MD. Call in 2 day(s).   Specialty:  Cardiology Why:  Call office to make follow-up appointment, see cardiology 1-2 weeks Contact information: [address] [place] [address] [phone]           Signed: Reyne Dumas 12/04/2015, 12:27 PM        Time spent >45 mins

## 2015-12-04 NOTE — Progress Notes (Signed)
Pt ambulates 446ft and remained above 97% O2 sat without oxygen. Pt had no complaints of shortness of breath or chest pain. Will continue to monitor.

## 2015-12-04 NOTE — Consult Note (Signed)
   [facility] CM Inpatient Consult   12/04/2015  [name] 15-Oct-1940 [id number]    Patient screened for [place] Management services. Went to bedside to offer and explain [facility] Care Management program with [name] prior to discharge. Patient declined [place] Management follow up. States she is pretty independent and has good follow up. Accepted [facility] Care Management brochure with contact information to call in future if changes mind. Will make inpatient RNCM aware that patient declined [place] Management program services.  Marthenia Rolling, MSN-Ed, RN,BSN [facility] Liaison [phone]

## 2015-12-04 NOTE — Progress Notes (Signed)
Patient Name:  [name], DOB: 1940-10-11, MRN: [id number] Primary Doctor: [name], MD Primary Cardiologist:   Date: 12/04/2015   SUBJECTIVE:  The patient is feeling well this morning.   Past Medical History:  Diagnosis Date  . ABDOMINAL PAIN, LOWER 12/31/2009  . ANXIETY 12/31/2009  . Aortic insufficiency   . CORONARY ARTERY DISEASE 12/31/2009  . DEPRESSION 12/31/2009  . [place] DISEASE, CERVICAL 12/31/2009  . [place] DISEASE, LUMBAR 12/31/2009  . DIVERTICULITIS, HX OF 12/31/2009  . GLUCOSE INTOLERANCE 12/31/2009  . HYPERLIPIDEMIA 12/31/2009  . HYPERTENSION 12/31/2009  . MENOPAUSE, EARLY 12/31/2009  . MITRAL REGURGITATION 12/31/2009  . MITRAL VALVE PROLAPSE 12/31/2009  . NSTEMI (non-ST elevated myocardial infarction) ([place]) 05/11/2015  . OSTEOARTHRITIS, HAND 12/31/2009  . Pacemaker 01/21/2012   MDT Adapta dual chamber  . Persistent atrial fibrillation ([place]) 01/22/2012  . PVD (peripheral vascular disease), hx stents to bil SFAs 02/2010 01/22/2012  . Symptomatic sinus bradycardia 01/22/2012  . Tricuspid regurgitation   . Unspecified urinary incontinence 12/31/2009   Vitals:   12/03/15 1156 12/03/15 1500 12/03/15 2025 12/04/15 0527  BP: (!) 154/62 (!) 178/84 139/83 (!) 144/63  Pulse: 76 75 76 73  Resp: 19   14  Temp: 97.5 F (36.4 C) 98.2 F (36.8 C) 98.9 F (37.2 C) 98.4 F (36.9 C)  TempSrc: Oral Oral Oral Oral  SpO2: 98% 97% 97% 95%  Weight:      Height:        Intake/Output Summary (Last 24 hours) at 12/04/15 0705 Last data filed at 12/03/15 1800  Gross per 24 hour  Intake              240 ml  Output                0 ml  Net              240 ml   Filed Weights   12/02/15 1518 12/03/15 0112  Weight: 130 lb (59 kg) 141 lb 3.2 oz (64 kg)     LABS: Basic Metabolic Panel:  Recent Labs  12/03/15 1626 12/04/15 0321  NA 137 139  K 3.8 4.4  CL 103 102  CO2 28 29  GLUCOSE 90 121*  BUN 7 12  CREATININE 0.92 0.92  CALCIUM 10.5* 10.3    Liver Function Tests:  Recent Labs  12/04/15 0321  AST 22  ALT 19  ALKPHOS 95  BILITOT 0.8  PROT 5.9*  ALBUMIN 3.4*   No results for input(s): LIPASE, AMYLASE in the last 72 hours. CBC:  Recent Labs  12/02/15 1523 12/04/15 0321  WBC 4.3 5.3  HGB 12.7 12.0  HCT 39.0 36.5  MCV 84.2 83.1  PLT 248 222   Cardiac Enzymes:  Recent Labs  12/03/15 0154 12/03/15 0412 12/03/15 0717  TROPONINI <0.03 <0.03 <0.03   BNP: Invalid input(s): POCBNP D-Dimer: No results for input(s): DDIMER in the last 72 hours. Thyroid Function Tests: No results for input(s): TSH, T4TOTAL, T3FREE, THYROIDAB in the last 72 hours.  Invalid input(s): FREET3  RADIOLOGY: Dg Chest 2 View  Result Date: 12/02/2015 CLINICAL DATA:  Chest pain, shortness of breath EXAM: CHEST  2 VIEW COMPARISON:  CT chest 08/26/2015 FINDINGS: There is no focal parenchymal opacity. There is no pleural effusion or pneumothorax. There is mild stable cardiomegaly. There is a dual lead AICD. There is evidence of prior CABG. The osseous structures are unremarkable. IMPRESSION: No active cardiopulmonary disease. Electronically Signed   By:  Kathreen Devoid   On: 12/02/2015 16:00   Ct Head Wo Contrast  Result Date: 12/02/2015 CLINICAL DATA:  Initial evaluation for acute headache. EXAM: CT HEAD WITHOUT CONTRAST TECHNIQUE: Contiguous axial images were obtained from the base of the skull through the vertex without intravenous contrast. COMPARISON:  None. FINDINGS: Brain: Generalized cerebral atrophy with chronic microvascular ischemic disease. No acute intracranial hemorrhage. No evidence for acute large vessel territory infarct. No mass lesion, midline shift or mass effect. No hydrocephalus. No extra-axial fluid collection. Vascular: No hyperdense vessel. Scattered vascular calcifications noted. Skull: Scalp soft tissues within normal limits.  Calvarium intact. Sinuses/Orbits: Globes and orbital soft tissues within normal limits. Patient  is status post lens extraction on the left. Paranasal sinuses and mastoid air cells are clear. IMPRESSION: 1. No acute intracranial process. 2. Generalized age-related cerebral atrophy with moderate chronic microvascular ischemic disease. Electronically Signed   By: Jeannine Boga M.D.   On: 12/02/2015 20:50    PHYSICAL EXAM    the patient is comfortable in bed. She is oriented to person time and place. Affect is normal. Head is atraumatic. Lungs are clear. Respiratory effort is not labored. Cardiac exam reveals S1 and S2. Abdomen is soft. There is no peripheral edema. There are no musculoskeletal deformities.   TELEMETRY: I have reviewed telemetry today December 04, 2015. There is atrial pacing.    ASSESSMENT AND PLAN:    Headache     The patient presented with a headache. This improved rapidly and she's had no significant recurrence.    Hyperlipidemia     We will leave it to Dr. Sallyanne Kuster to reconsider whether a statin should be tried again.    Essential hypertension     With the adjustments made in her medications, her blood pressure is now under reasonable control.    PAF (paroxysmal atrial fibrillation) ([place])     She is holding sinus rhythm at this time on an amiodarone.  She is anticoagulated.    S/P placement of cardiac pacemaker, medtronic adapta 01/21/12      At this time she is atrial pacing.    CAD (coronary artery disease)      Patient is post CABG and other interventions. Her troponins are normal this admission. She did not have her classic right upper chest pain with radiation to the back. No definite evidence of coronary ischemia.    S/P CABG x 3 01/04/15    Chest pain     At this point, her chest fullness may be related to her hypertension.    Chronic diastolic heart failure (HCC)      Volume status is stable.    Hypercalcemia      The patient can have further outpatient evaluation of this problem.    Hyperkalemia      Potassium has now normalized with  the adjustments of her medications.    H/O amiodarone therapy      The patient continues on amiodarone.  At this point overall the patient is stable. Plan to ambulate her in the hall this morning. Assuming she does this without difficulties, she could be discharged home with early follow-up by Dr. Sallyanne Kuster and our team.  Dola Argyle 12/04/2015 7:05 AM

## 2015-12-05 ENCOUNTER — Telehealth: Payer: Self-pay | Admitting: Cardiovascular Disease

## 2015-12-05 ENCOUNTER — Emergency Department (HOSPITAL_COMMUNITY): Payer: Commercial Managed Care - HMO

## 2015-12-05 ENCOUNTER — Encounter (HOSPITAL_COMMUNITY): Payer: Self-pay

## 2015-12-05 ENCOUNTER — Observation Stay (HOSPITAL_COMMUNITY)
Admission: EM | Admit: 2015-12-05 | Discharge: 2015-12-09 | Disposition: A | Discharge status: Home / Self Care | Payer: Commercial Managed Care - HMO | Attending: Internal Medicine | Admitting: Internal Medicine

## 2015-12-05 DIAGNOSIS — I081 Rheumatic disorders of both mitral and tricuspid valves: Secondary | ICD-10-CM | POA: Diagnosis not present

## 2015-12-05 DIAGNOSIS — I110 Hypertensive heart disease with heart failure: Secondary | ICD-10-CM | POA: Diagnosis not present

## 2015-12-05 DIAGNOSIS — I2510 Atherosclerotic heart disease of native coronary artery without angina pectoris: Secondary | ICD-10-CM | POA: Insufficient documentation

## 2015-12-05 DIAGNOSIS — Z951 Presence of aortocoronary bypass graft: Secondary | ICD-10-CM | POA: Diagnosis not present

## 2015-12-05 DIAGNOSIS — R55 Syncope and collapse: Principal | ICD-10-CM

## 2015-12-05 DIAGNOSIS — I252 Old myocardial infarction: Secondary | ICD-10-CM | POA: Insufficient documentation

## 2015-12-05 DIAGNOSIS — Z87891 Personal history of nicotine dependence: Secondary | ICD-10-CM | POA: Diagnosis not present

## 2015-12-05 DIAGNOSIS — I952 Hypotension due to drugs: Secondary | ICD-10-CM | POA: Diagnosis not present

## 2015-12-05 DIAGNOSIS — I5032 Chronic diastolic (congestive) heart failure: Secondary | ICD-10-CM | POA: Insufficient documentation

## 2015-12-05 DIAGNOSIS — I739 Peripheral vascular disease, unspecified: Secondary | ICD-10-CM | POA: Diagnosis not present

## 2015-12-05 DIAGNOSIS — I482 Chronic atrial fibrillation, unspecified: Secondary | ICD-10-CM | POA: Diagnosis present

## 2015-12-05 DIAGNOSIS — Z950 Presence of cardiac pacemaker: Secondary | ICD-10-CM | POA: Insufficient documentation

## 2015-12-05 DIAGNOSIS — Z79899 Other long term (current) drug therapy: Secondary | ICD-10-CM | POA: Insufficient documentation

## 2015-12-05 DIAGNOSIS — M19049 Primary osteoarthritis, unspecified hand: Secondary | ICD-10-CM | POA: Diagnosis not present

## 2015-12-05 DIAGNOSIS — Z7901 Long term (current) use of anticoagulants: Secondary | ICD-10-CM | POA: Insufficient documentation

## 2015-12-05 DIAGNOSIS — I959 Hypotension, unspecified: Secondary | ICD-10-CM | POA: Diagnosis not present

## 2015-12-05 DIAGNOSIS — R531 Weakness: Secondary | ICD-10-CM | POA: Diagnosis present

## 2015-12-05 DIAGNOSIS — Z9049 Acquired absence of other specified parts of digestive tract: Secondary | ICD-10-CM | POA: Diagnosis not present

## 2015-12-05 DIAGNOSIS — R079 Chest pain, unspecified: Secondary | ICD-10-CM | POA: Diagnosis not present

## 2015-12-05 DIAGNOSIS — I480 Paroxysmal atrial fibrillation: Secondary | ICD-10-CM | POA: Insufficient documentation

## 2015-12-05 DIAGNOSIS — Z7902 Long term (current) use of antithrombotics/antiplatelets: Secondary | ICD-10-CM | POA: Insufficient documentation

## 2015-12-05 DIAGNOSIS — M6281 Muscle weakness (generalized): Secondary | ICD-10-CM

## 2015-12-05 DIAGNOSIS — F329 Major depressive disorder, single episode, unspecified: Secondary | ICD-10-CM | POA: Insufficient documentation

## 2015-12-05 DIAGNOSIS — Z955 Presence of coronary angioplasty implant and graft: Secondary | ICD-10-CM | POA: Insufficient documentation

## 2015-12-05 LAB — URINALYSIS, ROUTINE W REFLEX MICROSCOPIC
Bilirubin Urine: NEGATIVE
Glucose, UA: NEGATIVE mg/dL
Hgb urine dipstick: NEGATIVE
Ketones, ur: NEGATIVE mg/dL
Leukocytes, UA: NEGATIVE
Nitrite: NEGATIVE
Protein, ur: NEGATIVE mg/dL
Specific Gravity, Urine: 1.013 (ref 1.005–1.030)
pH: 6.5 (ref 5.0–8.0)

## 2015-12-05 LAB — TROPONIN I: Troponin I: <0.03 ng/mL (ref <0.03)

## 2015-12-05 LAB — I-STAT TROPONIN, ED: Troponin i, poc: 0 ng/mL (ref 0.00–0.08)

## 2015-12-05 LAB — BASIC METABOLIC PANEL
Anion gap: 9 (ref 5–15)
BUN: 19 mg/dL (ref 6–20)
CO2: 26 mmol/L (ref 22–32)
Calcium: 10.9 mg/dL — ABNORMAL HIGH (ref 8.9–10.3)
Chloride: 102 mmol/L (ref 101–111)
Creatinine, Ser: 1.16 mg/dL — ABNORMAL HIGH (ref 0.44–1.00)
GFR calc Af Amer: 52 mL/min — ABNORMAL LOW (ref >60)
GFR calc non Af Amer: 45 mL/min — ABNORMAL LOW (ref >60)
Glucose, Bld: 145 mg/dL — ABNORMAL HIGH (ref 65–99)
Potassium: 3.7 mmol/L (ref 3.5–5.1)
Sodium: 137 mmol/L (ref 135–145)

## 2015-12-05 LAB — CBC
HCT: 38.6 % (ref 36.0–46.0)
Hemoglobin: 12.4 g/dL (ref 12.0–15.0)
MCH: 27 pg (ref 26.0–34.0)
MCHC: 32.1 g/dL (ref 30.0–36.0)
MCV: 84.1 fL (ref 78.0–100.0)
Platelets: 257 10*3/uL (ref 150–400)
RBC: 4.59 MIL/uL (ref 3.87–5.11)
RDW: 19.2 % — ABNORMAL HIGH (ref 11.5–15.5)
WBC: 5.5 10*3/uL (ref 4.0–10.5)

## 2015-12-05 LAB — PROTIME-INR
INR: 1.41
Prothrombin Time: 17.4 seconds — ABNORMAL HIGH (ref 11.4–15.2)

## 2015-12-05 LAB — I-STAT CG4 LACTIC ACID, ED: Lactic Acid, Venous: 2.17 mmol/L (ref 0.5–1.9)

## 2015-12-05 LAB — CBG MONITORING, ED: Glucose-Capillary: 158 mg/dL — ABNORMAL HIGH (ref 65–99)

## 2015-12-05 LAB — LACTIC ACID, PLASMA: Lactic Acid, Venous: 1.3 mmol/L (ref 0.5–1.9)

## 2015-12-05 MED ORDER — LABETALOL HCL 5 MG/ML IV SOLN
5.0000 mg | INTRAVENOUS | Status: DC | PRN
  Administered 2015-12-08 (×4): 5 mg via INTRAVENOUS
  Filled 2015-12-05 (×7): qty 4

## 2015-12-05 MED ORDER — AMIODARONE HCL 100 MG PO TABS
100.0000 mg | ORAL_TABLET | Freq: Every day | ORAL | Status: DC
  Administered 2015-12-06 – 2015-12-09 (×4): 100 mg via ORAL
  Filled 2015-12-05 (×4): qty 1

## 2015-12-05 MED ORDER — MULTIVITAMINS PO CAPS: 1.0000 | ORAL_CAPSULE | Freq: Every day | ORAL | Status: DC

## 2015-12-05 MED ORDER — CITALOPRAM HYDROBROMIDE 10 MG PO TABS
10.0000 mg | ORAL_TABLET | Freq: Every day | ORAL | Status: DC
Start: 2015-12-06 — End: 2015-12-09
  Administered 2015-12-06 – 2015-12-09 (×4): 10 mg via ORAL
  Filled 2015-12-05 (×4): qty 1

## 2015-12-05 MED ORDER — ACETAMINOPHEN 650 MG RE SUPP: 650.0000 mg | Freq: Four times a day (QID) | RECTAL | Status: DC | PRN

## 2015-12-05 MED ORDER — HYDROCODONE-ACETAMINOPHEN 5-325 MG PO TABS
1.0000 | ORAL_TABLET | ORAL | Status: DC | PRN
  Filled 2015-12-05: qty 1

## 2015-12-05 MED ORDER — SODIUM CHLORIDE 0.9% FLUSH
3.0000 mL | Freq: Two times a day (BID) | INTRAVENOUS | Status: DC
  Administered 2015-12-05 – 2015-12-09 (×8): 3 mL via INTRAVENOUS

## 2015-12-05 MED ORDER — ADULT MULTIVITAMIN W/MINERALS CH
1.0000 | ORAL_TABLET | Freq: Every day | ORAL | Status: DC
  Administered 2015-12-06 – 2015-12-09 (×4): 1 via ORAL
  Filled 2015-12-05 (×4): qty 1

## 2015-12-05 MED ORDER — VITAMIN D3 25 MCG (1000 UNIT) PO TABS
1000.0000 [IU] | ORAL_TABLET | Freq: Every day | ORAL | Status: DC
  Administered 2015-12-06 – 2015-12-09 (×4): 1000 [IU] via ORAL
  Filled 2015-12-05 (×4): qty 1

## 2015-12-05 MED ORDER — RIVAROXABAN 15 MG PO TABS
15.0000 mg | ORAL_TABLET | Freq: Every day | ORAL | Status: DC
  Administered 2015-12-05 – 2015-12-08 (×4): 15 mg via ORAL
  Filled 2015-12-05 (×5): qty 1

## 2015-12-05 MED ORDER — ONDANSETRON HCL 4 MG/2ML IJ SOLN: 4.0000 mg | Freq: Four times a day (QID) | INTRAMUSCULAR | Status: DC | PRN

## 2015-12-05 MED ORDER — ACETAMINOPHEN 325 MG PO TABS: 650.0000 mg | ORAL_TABLET | Freq: Four times a day (QID) | ORAL | Status: DC | PRN

## 2015-12-05 MED ORDER — ONDANSETRON HCL 4 MG PO TABS: 4.0000 mg | ORAL_TABLET | Freq: Four times a day (QID) | ORAL | Status: DC | PRN

## 2015-12-05 MED ORDER — POLYETHYLENE GLYCOL 3350 17 G PO PACK: 17.0000 g | PACK | Freq: Every day | ORAL | Status: DC | PRN

## 2015-12-05 MED ORDER — SODIUM CHLORIDE 0.9 % IV SOLN
INTRAVENOUS | Status: DC
  Administered 2015-12-05: 22:00:00 via INTRAVENOUS

## 2015-12-05 MED ORDER — LISINOPRIL 10 MG PO TABS: 10.0000 mg | ORAL_TABLET | Freq: Every day | ORAL | Status: DC

## 2015-12-05 MED ORDER — CLOPIDOGREL BISULFATE 75 MG PO TABS
75.0000 mg | ORAL_TABLET | Freq: Every day | ORAL | Status: DC
  Administered 2015-12-06 – 2015-12-09 (×4): 75 mg via ORAL
  Filled 2015-12-05 (×4): qty 1

## 2015-12-05 NOTE — ED Notes (Signed)
Pt reports being d/c from [facility] yesterday, states she has not felt good.  Reports syncopal episode witnessed by her son-in-law.  Reports her head feels "strange."  Pt is A&O x 4.

## 2015-12-05 NOTE — Telephone Encounter (Signed)
Spoke with pt, she reports getting discharged from the hospital yesterday. She was going to the pharmacy today and she faint. She reports chest tightness and feeling very bad. She is currently sitting in the [place] parking lot and wants to come to our office. Advised patient to go into the ER. She reports she is waiting on her daughter because she does not feel like she can make it out of the car into the building. Advised patient to tell the [place] parking  attendant that she needs help. Called and spoke to the ER department at [facility] long, they are going to try to locate the patient.

## 2015-12-05 NOTE — H&P (Signed)
History and Physical    [name] [id number] DOB: [birth year] DOA: 12/05/2015  PCP: [name], MD   Patient coming from: Home  Chief Complaint: Syncope, lightheadedness, nausea  HPI: [name] is a 75 y.o. female with medical history significant for hypertension, coronary artery disease status post CABG, chronic diastolic CHF, and paroxysmal atrial fibrillation who presents the emergency department following a syncopal episode. Patient was just discharged from the hospital yesterday after she was managed for hyperkalemia and hypertensive urgency. Blood pressures were very difficult to control during the admission and the patient has allergy and intolerance to multiple agents. Cardiology consultation was obtained during the recent hospitalization. She was discharged yesterday after Toprol was switched to Coreg and HCTZ was added. She reports feeling well at time of discharge and went to bed last night in her usual state of health. She reports waking this morning with some lightheadedness and mild malaise. These symptoms progressed throughout the course in the morning and patient went to the pharmacy to pick up her new prescriptions of Coreg and HCTZ. She took her first dose of these medications at the pharmacy and was then driving back home when she had severe worsening in her lightheadedness and malaise, feeling as though she may lose consciousness. She was driving past [place] and pulled into the parking lot. She called her daughter and her physician from the parking lot after she had briefly lost consciousness, and was directed to the ED. Patient endorses a mild frontal headache associated with this, and endorses some central chest discomfort, but denies chest pain per se and denies palpitations. Patient also denies dyspnea or cough and she denies any fevers or chills. There has been no recent trauma or fall. Patient denies any use of alcohol or illicit substances. She has  never experienced this previously.    ED Course: Upon arrival to the ED, patient is found to be afebrile, saturating well on room air, hypotensive due to 76/60, and with vitals otherwise stable. EKG demonstrates an atrial paced rhythm with LVH by voltage criteria and secondary repolarization abnormality. Chest x-ray is negative for acute cardiopulmonary disease. Chemstrip panel is notable for a serum creatinine 1.16, up from her apparent baseline of 0.9. CBC is unremarkable, troponin is undetectable, and lactic acid is mildly elevated to 2.17. Urinalysis was ordered but remains pending. Blood pressure slowly came up spontaneously in the emergency department and this was accompanied by resolution of the patient's presenting complaints. She will be observed on the telemetry unit for ongoing evaluation and management of a syncopal episode suspected secondary to drug-induced hypotension.   Review of Systems:  All other systems reviewed and apart from HPI, are negative.  Past Medical History:  Diagnosis Date  . ABDOMINAL PAIN, LOWER 12/31/2009  . ANXIETY 12/31/2009  . Aortic insufficiency   . CORONARY ARTERY DISEASE 12/31/2009  . DEPRESSION 12/31/2009  . [place] DISEASE, CERVICAL 12/31/2009  . [place] DISEASE, LUMBAR 12/31/2009  . DIVERTICULITIS, HX OF 12/31/2009  . GLUCOSE INTOLERANCE 12/31/2009  . HYPERLIPIDEMIA 12/31/2009  . HYPERTENSION 12/31/2009  . MENOPAUSE, EARLY 12/31/2009  . MITRAL REGURGITATION 12/31/2009  . MITRAL VALVE PROLAPSE 12/31/2009  . NSTEMI (non-ST elevated myocardial infarction) ([place]) 05/11/2015  . OSTEOARTHRITIS, HAND 12/31/2009  . Pacemaker 01/21/2012   MDT Adapta dual chamber  . Persistent atrial fibrillation ([place]) 01/22/2012  . PVD (peripheral vascular disease), hx stents to bil SFAs 02/2010 01/22/2012  . Symptomatic sinus bradycardia 01/22/2012  . Tricuspid regurgitation   . Unspecified  urinary incontinence 12/31/2009    Past Surgical History:  Procedure Laterality  Date  . AUGMENTATION MAMMAPLASTY  1983  . CARDIAC CATHETERIZATION  2005   "negative" (01/21/2012)  . CARDIAC CATHETERIZATION  09/17/1999   Mild CAD involving proximal portion of LAD. Mitral valve prolapse w/ mitral regurg. No evidence of renal artery stenosis. Recommended readjustment of antihypertensive medications.  Marland Kitchen CARDIAC CATHETERIZATION N/A 01/01/2015   Procedure: Left Heart Cath and Coronary Angiography;  Surgeon: Jettie Booze, MD;  Location: [place] CV LAB;  Service: Cardiovascular;  Laterality: N/A;  . CARDIAC CATHETERIZATION N/A 05/12/2015   Procedure: Left Heart Cath and Coronary Angiography;  Surgeon: Troy Sine, MD;  Location: [place] CV LAB;  Service: Cardiovascular;  Laterality: N/A;  . CARDIAC CATHETERIZATION N/A 05/12/2015   Procedure: Coronary Stent Intervention;  Surgeon: Troy Sine, MD;  Location: [place] CV LAB;  Service: Cardiovascular;  Laterality: N/A;  . CARDIAC SURGERY     11/20/20116  . CARDIOVERSION N/A 02/01/2015   Procedure: CARDIOVERSION;  Surgeon: Lelon Perla, MD;  Location[facility] ENDOSCOPY;  Service: Cardiovascular;  Laterality: N/A;  . CARDIOVERSION N/A 08/30/2015   Procedure: CARDIOVERSION;  Surgeon: Sanda Klein, MD;  Location: [place] ENDOSCOPY;  Service: Cardiovascular;  Laterality: N/A;  . CARPAL TUNNEL RELEASE  2008   "right hand/thumb; carpal tunnel repair; got rid of arthritis" (01/21/2012)  . CLIPPING OF ATRIAL APPENDAGE N/A 01/04/2015   Procedure: CLIPPING OF ATRIAL APPENDAGE;  Surgeon: Grace Isaac, MD;  Location: [place];  Service: Open Heart Surgery;  Laterality: N/A;  . CORONARY ARTERY BYPASS GRAFT N/A 01/04/2015   Procedure: CORONARY ARTERY BYPASS GRAFTING (CABG) x 3 using left internal mammory artery and greater saphenous vein right leg harvested endoscopically.;  Surgeon: Grace Isaac, MD;  LIMA-LAD, SVG-RI, SVG-PDA  . FACELIFT, LOWER 2/3  1995   "mini" (01/21/2012)  . Lower Arterial Examination  10/28/2011   R.  SFA stent mild-moderate mixed density plaque with elevated velocities consistent with 50% diameter reduction. L. SFA stent moderate mixed denisty plaque at mid to distal level consistent with 50-69% diameter reduction.  . OOPHORECTOMY  ~1979  . PACEMAKER INSERTION  01/21/2012   Medtronic Adapta, model# ADDRL1, serial# [id number]  . PARTIAL COLECTOMY  2010  . PERIPHERAL ARTERIAL STENT GRAFT  2012; 2012   "LLE; RLE" (01/21/2012)  . PERMANENT PACEMAKER INSERTION N/A 01/21/2012   Medtronic Adapta L implanted by Dr Sallyanne Kuster for tachy/brady syndrome  . Persantine Myoview Stress Test  02/25/2010   No scintigraphic evidence of inducible myocardial ischemia. No persantine EKG changes. Non-diagnositc for ishcemia. Low-risk, normal scan.  Marland Kitchen POSTERIOR CERVICAL LAMINECTOMY  1985  . TEE WITHOUT CARDIOVERSION N/A 01/04/2015   Procedure: TRANSESOPHAGEAL ECHOCARDIOGRAM (TEE);  Surgeon: Grace Isaac, MD;  Location: [place];  Service: Open Heart Surgery;  Laterality: N/A;  . TEE WITHOUT CARDIOVERSION N/A 02/01/2015   Procedure: TRANSESOPHAGEAL ECHOCARDIOGRAM (TEE);  Surgeon: Lelon Perla, MD;  Location: [facility] ENDOSCOPY;  Service: Cardiovascular;  Laterality: N/A;  . TEE WITHOUT CARDIOVERSION N/A 08/30/2015   Procedure: TRANSESOPHAGEAL ECHOCARDIOGRAM (TEE);  Surgeon: Sanda Klein, MD;  Location: [place] ENDOSCOPY;  Service: Cardiovascular;  Laterality: N/A;  . [place]     reports that she has quit smoking. Her smoking use included Cigarettes. She has a 7.50 pack-year smoking history. She has never used smokeless tobacco. She reports that she drinks alcohol. She reports that she does not use drugs.  Allergies  Allergen Reactions  . Brilinta [Ticagrelor] Shortness  Of Breath  . Clonidine Derivatives Other (See Comments)    Lowers heart rate  . Atorvastatin Other (See Comments)    Possible cause of fatigue/malaise  . Crestor [Rosuvastatin Calcium] Other (See Comments)    Joint/muscle aches    . Exforge [Amlodipine Besylate-Valsartan] Itching and Rash    Family History  Problem Relation Age of Onset  . Heart disease Mother   . Hypertension Mother   . Stroke Mother   . Stroke Father   . Heart disease Father   . Heart disease Brother   . Hypertension Brother     2 brothers  . CVA Brother     2 brothers  . Heart attack Brother     2 brothers     Prior to Admission medications   Medication Sig Start Date End Date Taking? Authorizing Provider  amiodarone (PACERONE) 100 MG tablet Take 1 tablet (100 mg total) by mouth daily. 10/17/15  Yes Mihai Croitoru, MD  carvedilol (COREG) 25 MG tablet Take 1 tablet (25 mg total) by mouth 2 (two) times daily with a meal. 12/04/15  Yes Reyne Dumas, MD  cholecalciferol (VITAMIN D) 1000 UNITS tablet Take 1,000 Units by mouth daily.   Yes Historical Provider, MD  citalopram (CELEXA) 20 MG tablet Take 1 tablet (20 mg total) by mouth daily. Patient taking differently: Take 10 mg by mouth daily.  12/05/12  Yes Brett Canales, PA-C  clopidogrel (PLAVIX) 75 MG tablet TAKE 1 TABLET (75 MG TOTAL) BY MOUTH DAILY. 12/02/15  Yes Mihai Croitoru, MD  hydrochlorothiazide (HYDRODIURIL) 25 MG tablet Take 1 tablet (25 mg total) by mouth daily. 12/05/15  Yes Reyne Dumas, MD  lisinopril (PRINIVIL,ZESTRIL) 20 MG tablet Take 1 tablet (20 mg total) by mouth daily. Patient taking differently: Take 10 mg by mouth daily.  11/05/15  Yes Mihai Croitoru, MD  Multiple Vitamin (MULTIVITAMIN) capsule Take 1 capsule by mouth daily.   Yes Historical Provider, MD  nitroGLYCERIN (NITROSTAT) 0.4 MG SL tablet Place 1 tablet (0.4 mg total) under the tongue every 5 (five) minutes x 3 doses as needed for chest pain. 05/15/15  Yes Brittainy Erie Noe, PA-C  rivaroxaban (XARELTO) 15 MG TABS tablet Take 1 tablet (15 mg total) by mouth daily with supper. 11/04/15  Yes Mihai Croitoru, MD  Coenzyme Q10 (COQ10 PO) Take 120 mg by mouth daily.    Historical Provider, MD    Physical  Exam: Vitals:   12/05/15 1745 12/05/15 1815 12/05/15 1816 12/05/15 1849  BP: 158/75 159/73 159/73 168/77  Pulse: 76 80 79 77  Resp: 18 15 17 16   Temp:      TempSrc:      SpO2: 99% 98% 100% 100%  Weight:      Height:          Constitutional: NAD, calm, comfortable Eyes: PERTLA, lids and conjunctivae normal ENMT: Mucous membranes are moist. Posterior pharynx clear of any exudate or lesions.   Neck: normal, supple, no masses, no thyromegaly Respiratory: clear to auscultation bilaterally, no wheezing, no crackles. Normal respiratory effort.   Cardiovascular: S1 & S2 heard, regular rate and rhythm, grade 3 holosystolic murmur at RSB. No extremity edema. Soft bruit over left carotid. No significant JVD. Abdomen: No distension, no tenderness, no masses palpated. Bowel sounds normal.  Musculoskeletal: no clubbing / cyanosis. No joint deformity upper and lower extremities. Normal muscle tone.  Skin: no significant rashes, lesions, ulcers. Warm, dry, well-perfused. Neurologic: CN 2-12 grossly intact. Sensation intact, DTR normal. Strength 5/5  in all 4 limbs.  Psychiatric: Normal judgment and insight. Alert and oriented x 3. Normal mood and affect.     Labs on Admission: I have personally reviewed following labs and imaging studies  CBC:  Recent Labs Lab 12/02/15 1523 12/04/15 0321 12/05/15 1606  WBC 4.3 5.3 5.5  HGB 12.7 12.0 12.4  HCT 39.0 36.5 38.6  MCV 84.2 83.1 84.1  PLT 248 222 [id number]   Basic Metabolic Panel:  Recent Labs Lab 12/02/15 1523 12/03/15 1626 12/04/15 0321 12/05/15 1606  NA 141 137 139 137  K 5.7* 3.8 4.4 3.7  CL 105 103 102 102  CO2 29 28 29 26   GLUCOSE 110* 90 121* 145*  BUN 16 7 12 19   CREATININE 0.97 0.92 0.92 1.16*  CALCIUM 10.9* 10.5* 10.3 10.9*   GFR: Estimated Creatinine Clearance: 36.2 mL/min (by C-G formula based on SCr of 1.16 mg/dL (H)). Liver Function Tests:  Recent Labs Lab 12/04/15 0321  AST 22  ALT 19  ALKPHOS 95  BILITOT 0.8   PROT 5.9*  ALBUMIN 3.4*   No results for input(s): LIPASE, AMYLASE in the last 168 hours. No results for input(s): AMMONIA in the last 168 hours. Coagulation Profile:  Recent Labs Lab 12/05/15 1606  INR 1.41   Cardiac Enzymes:  Recent Labs Lab 12/02/15 2241 12/03/15 0154 12/03/15 0412 12/03/15 0717  TROPONINI <0.03 <0.03 <0.03 <0.03   BNP (last 3 results) No results for input(s): PROBNP in the last 8760 hours. HbA1C: No results for input(s): HGBA1C in the last 72 hours. CBG:  Recent Labs Lab 12/02/15 2250 12/05/15 1615  GLUCAP 112* 158*   Lipid Profile: No results for input(s): CHOL, HDL, LDLCALC, TRIG, CHOLHDL, LDLDIRECT in the last 72 hours. Thyroid Function Tests: No results for input(s): TSH, T4TOTAL, FREET4, T3FREE, THYROIDAB in the last 72 hours. Anemia Panel: No results for input(s): VITAMINB12, FOLATE, FERRITIN, TIBC, IRON, RETICCTPCT in the last 72 hours. Urine analysis:    Component Value Date/Time   COLORURINE YELLOW 12/05/2015 1600   APPEARANCEUR CLEAR 12/05/2015 1600   LABSPEC 1.013 12/05/2015 1600   PHURINE 6.5 12/05/2015 1600   GLUCOSEU NEGATIVE 12/05/2015 1600   GLUCOSEU NEGATIVE 12/31/2009 1612   HGBUR NEGATIVE 12/05/2015 1600   BILIRUBINUR NEGATIVE 12/05/2015 1600   KETONESUR NEGATIVE 12/05/2015 1600   PROTEINUR NEGATIVE 12/05/2015 1600   UROBILINOGEN 1.0 06/03/2012 1610   NITRITE NEGATIVE 12/05/2015 1600   LEUKOCYTESUR NEGATIVE 12/05/2015 1600   Sepsis Labs: @LABRCNTIP (procalcitonin:4,lacticidven:4) )No results found for this or any previous visit (from the past 240 hour(s)).   Radiological Exams on Admission: Dg Chest Port 1 View  Result Date: 12/05/2015 CLINICAL DATA:  Chest pain and shortness of Breath EXAM: PORTABLE CHEST 1 VIEW COMPARISON:  12/02/2015 FINDINGS: Cardiac shadow is stable. Bilateral breast implants are noted. A pacemaker is again seen and stable. Postsurgical changes are again noted. No focal infiltrate or  sizable effusion is seen. No bony abnormality is noted. IMPRESSION: No acute abnormality seen. No significant change from the prior exam. Electronically Signed   By: Inez Catalina M.D.   On: 12/05/2015 16:58    EKG: Independently reviewed. Atrial-paced rhythm, LVH with secondary repolarization abnormality   Assessment/Plan  1. Syncope  - Suspected secondary to hypotension, likely influenced by her antihypertensive medications  - She was very lightheaded and pre-syncopal initially, but sxs resolved as BP came up  - There are no focal neurologic deficits to prompt CNS imaging, but carotid doppler will be obtained as there is a  left carotid bruit noted  - TTE just performed the day prior to admission and will not be repeated at this time; results noted below   - Plan to monitor on telemetry, check orthostatic vitals, gently hydrate, hold Coreg and HCTZ for now  2. Hypertension  - Pt recently admitted with very difficult to manage hypertensive urgency; cardiology was consulted and pt was discharged with Coreg replacing Toprol, and with the addition of HCTZ - She was hypotensive in ED after a syncopal episode  - Plan to hold Coreg and HCTZ on admission, continue lisinopril in the am as tolerated  - Monitor and treat as needed with small labetalol IVP's for now   3. Atrial fibrillation, paroxysmal  - CHADS-VASc at least 66 (age x2, gender, CHF, HTN, CAD)  - Continue AC with Xarelto; continue amiodarone  - Monitoring on telemetry    4. Chronic diastolic CHF - Appears dry on admission and is being gently hydrated  - TTE (12/04/15) with EF 0000000, grade 1 diastolic dysfunction, moderate AI - Managed with lisinopril and Coreg; Coreg is held on admission as above in setting of syncope with hypotension  - Follow I/O's and daily wts    5. CAD - pt is s/p CABG in November 2016, 2 stents placed in April 2017; stress test September 2017 neg for ischemia  - No anginal complaints on admission, though pt  had experienced central chest discomfort just PTA and took a NTG for the first time  - EKG not significantly changed from prior and troponin is undetectable  - Monitor on telemetry, obtain serial troponin measurement x3 given CP pta     DVT prophylaxis: Xarelto  Code Status: Partial, no intubation  Family Communication: Daughter-in-law updated at bedside at pt's request Disposition Plan: Observe on telemetry Consults called: None Admission status: Observation    Vianne Bulls, MD [facility] Pager [phone]  If 7PM-7AM, please contact night-coverage [email or web address] Password [place]  12/05/2015, 7:49 PM

## 2015-12-05 NOTE — Telephone Encounter (Signed)
New Message °

## 2015-12-05 NOTE — ED Notes (Signed)
Report to Sonia Side RN-ready for patient

## 2015-12-05 NOTE — ED Notes (Signed)
Family at bedside. 

## 2015-12-05 NOTE — ED Triage Notes (Addendum)
Pt c/o weakness, intermittent headache, and nausea x 3 days.  Pain score 5/10.  Sts "my head feels real strange."  Pt was discharged from [facility] yesterday after being for same.  Pt reports taking nitroglycerin x 1 prior to arrival.  Sts this is the first time that she took nitro.  Hx of CABG and pacemaker.   Pt was laying on ground in parking lot when ED staff arrived.  Pt unable to assist with getting on stretcher.

## 2015-12-05 NOTE — ED Provider Notes (Signed)
[facility] DEPT Provider Note   CSN: [id number] Arrival date & time: 12/05/15  1549     History   Chief Complaint Chief Complaint  Patient presents with  . Weakness  . Nausea    HPI [name] is a 75 y.o. female.  HPI Patient was recently admitted to the hospital for chest pain. Workup without acute findings. Discharged yesterday. Started on new medication for her blood pressure. She developed lightheadedness earlier today which is persisted. She went to the pharmacy and filled her prescriptions and took 2 blood pressure medications. Lightheadedness worsened throughout the day. She drove herself to the hospital and developed chest pain that radiated across her chest. Describes as bandlike pain. Patient didn't nitroglycerin and had syncopal episode. She denies any chest pain currently. States she still feels lightheaded. Symptoms are worse with sitting or standing. Endorses nausea without vomiting. No shortness of breath, cough, fever or chills. Past Medical History:  Diagnosis Date  . ABDOMINAL PAIN, LOWER 12/31/2009  . ANXIETY 12/31/2009  . Aortic insufficiency   . CORONARY ARTERY DISEASE 12/31/2009  . DEPRESSION 12/31/2009  . [place] DISEASE, CERVICAL 12/31/2009  . [place] DISEASE, LUMBAR 12/31/2009  . DIVERTICULITIS, HX OF 12/31/2009  . GLUCOSE INTOLERANCE 12/31/2009  . HYPERLIPIDEMIA 12/31/2009  . HYPERTENSION 12/31/2009  . MENOPAUSE, EARLY 12/31/2009  . MITRAL REGURGITATION 12/31/2009  . MITRAL VALVE PROLAPSE 12/31/2009  . NSTEMI (non-ST elevated myocardial infarction) ([place]) 05/11/2015  . OSTEOARTHRITIS, HAND 12/31/2009  . Pacemaker 01/21/2012   MDT Adapta dual chamber  . Persistent atrial fibrillation ([place]) 01/22/2012  . PVD (peripheral vascular disease), hx stents to bil SFAs 02/2010 01/22/2012  . Symptomatic sinus bradycardia 01/22/2012  . Tricuspid regurgitation   . Unspecified urinary incontinence 12/31/2009    Patient Active Problem List   Diagnosis Date  Noted  . Syncope 12/05/2015  . Hypotension 12/05/2015  . Hyperkalemia 12/04/2015  . H/O amiodarone therapy 12/04/2015  . Headache 12/04/2015  . Chronic diastolic heart failure ([place]) 10/19/2015  . Hypercalcemia 10/19/2015  . Atypical atrial flutter ([place])   . Atrial flutter ([place]) 08/23/2015  . Febrile illness, acute 06/27/2015  . Hypoxemia 06/27/2015  . Chronic fatigue 06/27/2015  . Bronchitis   . Pyrexia   . Stented coronary artery   . Chest pain 05/11/2015  . NSTEMI (non-ST elevated myocardial infarction) ([place]) 05/11/2015  . S/P CABG x 3 01/04/15 01/10/2015  . CAD (coronary artery disease) 01/04/2015  . Accelerating angina ([place]) 01/01/2015  . Unstable angina pectoris ([place]) 12/28/2014  . Symptomatic sinus bradycardia 01/22/2012  . PAF (paroxysmal atrial fibrillation) ([place]) 01/22/2012  . Sinus node dysfunction ([place]) 01/22/2012  . S/P placement of cardiac pacemaker, medtronic adapta 01/21/12 01/22/2012  . PVD (peripheral vascular disease), hx stents to bil SFAs 02/2010 01/22/2012  . GLUCOSE INTOLERANCE 12/31/2009  . Hyperlipidemia 12/31/2009  . Anxiety 12/31/2009  . DEPRESSION 12/31/2009  . MITRAL REGURGITATION 12/31/2009  . Essential hypertension 12/31/2009  . Coronary atherosclerosis 12/31/2009  . MITRAL VALVE PROLAPSE 12/31/2009  . MENOPAUSE, EARLY 12/31/2009  . OSTEOARTHRITIS, HAND 12/31/2009  . [place] DISEASE, CERVICAL 12/31/2009  . [place] DISEASE, LUMBAR 12/31/2009  . UNSPECIFIED URINARY INCONTINENCE 12/31/2009  . ABDOMINAL PAIN, LOWER 12/31/2009  . DIVERTICULITIS, HX OF 12/31/2009    Past Surgical History:  Procedure Laterality Date  . AUGMENTATION MAMMAPLASTY  1983  . CARDIAC CATHETERIZATION  2005   "negative" (01/21/2012)  . CARDIAC CATHETERIZATION  09/17/1999   Mild CAD involving proximal portion of LAD. Mitral valve prolapse w/ mitral regurg. No evidence  of renal artery stenosis. Recommended readjustment of antihypertensive medications.  Marland Kitchen CARDIAC CATHETERIZATION  N/A 01/01/2015   Procedure: Left Heart Cath and Coronary Angiography;  Surgeon: Jettie Booze, MD;  Location: [facility] CV LAB;  Service: Cardiovascular;  Laterality: N/A;  . CARDIAC CATHETERIZATION N/A 05/12/2015   Procedure: Left Heart Cath and Coronary Angiography;  Surgeon: Troy Sine, MD;  Location: [facility] CV LAB;  Service: Cardiovascular;  Laterality: N/A;  . CARDIAC CATHETERIZATION N/A 05/12/2015   Procedure: Coronary Stent Intervention;  Surgeon: Troy Sine, MD;  Location: [facility] CV LAB;  Service: Cardiovascular;  Laterality: N/A;  . CARDIAC SURGERY     11/20/20116  . CARDIOVERSION N/A 02/01/2015   Procedure: CARDIOVERSION;  Surgeon: Lelon Perla, MD;  Location: [facility] ENDOSCOPY;  Service: Cardiovascular;  Laterality: N/A;  . CARDIOVERSION N/A 08/30/2015   Procedure: CARDIOVERSION;  Surgeon: Sanda Klein, MD;  Location: [facility] [facility] ENDOSCOPY;  Service: Cardiovascular;  Laterality: N/A;  . CARPAL TUNNEL RELEASE  2008   "right hand/thumb; carpal tunnel repair; got rid of arthritis" (01/21/2012)  . CLIPPING OF ATRIAL APPENDAGE N/A 01/04/2015   Procedure: CLIPPING OF ATRIAL APPENDAGE;  Surgeon: Grace Isaac, MD;  Location: [place] [facility];  Service: Open Heart Surgery;  Laterality: N/A;  . CORONARY ARTERY BYPASS GRAFT N/A 01/04/2015   Procedure: CORONARY ARTERY BYPASS GRAFTING (CABG) x 3 using left internal mammory artery and greater saphenous vein right leg harvested endoscopically.;  Surgeon: Grace Isaac, MD;  LIMA-LAD, SVG-RI, SVG-PDA  . FACELIFT, LOWER 2/3  1995   "mini" (01/21/2012)  . Lower Arterial Examination  10/28/2011   R. SFA stent mild-moderate mixed density plaque with elevated velocities consistent with 50% diameter reduction. L. SFA stent moderate mixed denisty plaque at mid to distal level consistent with 50-69% diameter reduction.  . OOPHORECTOMY  ~1979  . PACEMAKER INSERTION  01/21/2012   Medtronic Adapta, model# ADDRL1, serial# [id number]  . PARTIAL  COLECTOMY  2010  . PERIPHERAL ARTERIAL STENT GRAFT  2012; 2012   "LLE; RLE" (01/21/2012)  . PERMANENT PACEMAKER INSERTION N/A 01/21/2012   Medtronic Adapta L implanted by Dr Sallyanne Kuster for tachy/brady syndrome  . Persantine Myoview Stress Test  02/25/2010   No scintigraphic evidence of inducible myocardial ischemia. No persantine EKG changes. Non-diagnositc for ishcemia. Low-risk, normal scan.  Marland Kitchen POSTERIOR CERVICAL LAMINECTOMY  1985  . TEE WITHOUT CARDIOVERSION N/A 01/04/2015   Procedure: TRANSESOPHAGEAL ECHOCARDIOGRAM (TEE);  Surgeon: Grace Isaac, MD;  Location: [facility];  Service: Open Heart Surgery;  Laterality: N/A;  . TEE WITHOUT CARDIOVERSION N/A 02/01/2015   Procedure: TRANSESOPHAGEAL ECHOCARDIOGRAM (TEE);  Surgeon: Lelon Perla, MD;  [facility] END[facility];  Service: Cardiovascular;  Laterality: N/A;  . TEE WITHOUT CARDIOVERSION N/A 08/30/2015   Procedure: TRANSESOPHAGEAL ECHOCARDIOGRAM (TEE);  Surgeon: Sanda Klein, MD;  Location: [facility] ENDOSCOPY;  Service: Cardiovascular;  Laterality: N/A;  . [place]    OB History    No data available       Home Medications    Prior to Admission medications   Medication Sig Start Date End Date Taking? Authorizing Provider  amiodarone (PACERONE) 100 MG tablet Take 1 tablet (100 mg total) by mouth daily. 10/17/15  Yes Mihai Croitoru, MD  carvedilol (COREG) 25 MG tablet Take 1 tablet (25 mg total) by mouth 2 (two) times daily with a meal. 12/04/15  Yes Reyne Dumas, MD  cholecalciferol (VITAMIN D) 1000 UNITS tablet Take 1,000 Units by mouth daily.   Yes Historical Provider, MD  citalopram (CELEXA) 20 MG tablet Take 1 tablet (20 mg total) by mouth daily. Patient taking differently: Take 10 mg by mouth daily.  12/05/12  Yes Brett Canales, PA-C  clopidogrel (PLAVIX) 75 MG tablet TAKE 1 TABLET (75 MG TOTAL) BY MOUTH DAILY. 12/02/15  Yes Mihai Croitoru, MD  hydrochlorothiazide (HYDRODIURIL) 25 MG tablet Take 1 tablet (25 mg  total) by mouth daily. 12/05/15  Yes Reyne Dumas, MD  lisinopril (PRINIVIL,ZESTRIL) 20 MG tablet Take 1 tablet (20 mg total) by mouth daily. Patient taking differently: Take 10 mg by mouth daily.  11/05/15  Yes Mihai Croitoru, MD  Multiple Vitamin (MULTIVITAMIN) capsule Take 1 capsule by mouth daily.   Yes Historical Provider, MD  nitroGLYCERIN (NITROSTAT) 0.4 MG SL tablet Place 1 tablet (0.4 mg total) under the tongue every 5 (five) minutes x 3 doses as needed for chest pain. 05/15/15  Yes Brittainy Erie Noe, PA-C  rivaroxaban (XARELTO) 15 MG TABS tablet Take 1 tablet (15 mg total) by mouth daily with supper. 11/04/15  Yes Mihai Croitoru, MD  Coenzyme Q10 (COQ10 PO) Take 120 mg by mouth daily.    Historical Provider, MD    Family History Family History  Problem Relation Age of Onset  . Heart disease Mother   . Hypertension Mother   . Stroke Mother   . Stroke Father   . Heart disease Father   . Heart disease Brother   . Hypertension Brother     2 brothers  . CVA Brother     2 brothers  . Heart attack Brother     2 brothers    Social History Social History  Substance Use Topics  . Smoking status: Former Smoker    Packs/day: 0.75    Years: 10.00    Types: Cigarettes  . Smokeless tobacco: Never Used     Comment: 01/21/2012 "quit smoking ~ 2002"  . Alcohol use Yes     Comment: 01/21/2012 "3 times/wk I have a couple mixed drinks"; 06/28/2015 "nothing in the last 3 weeks cause I haven't felt good", reduced recently     Allergies   Brilinta [ticagrelor]; Clonidine derivatives; Atorvastatin; Crestor [rosuvastatin calcium]; and Exforge [amlodipine besylate-valsartan]   Review of Systems Review of Systems  Constitutional: Negative for chills and fever.  Eyes: Negative for visual disturbance.  Respiratory: Negative for cough, shortness of breath and wheezing.   Cardiovascular: Positive for chest pain. Negative for palpitations and leg swelling.  Gastrointestinal: Positive for  nausea. Negative for abdominal pain, constipation, diarrhea and vomiting.  Genitourinary: Negative for dysuria, flank pain, frequency, pelvic pain and urgency.  Musculoskeletal: Negative for arthralgias, back pain, myalgias and neck pain.  Skin: Negative for rash and wound.  Neurological: Positive for dizziness, syncope and light-headedness. Negative for tremors, weakness, numbness and headaches.  All other systems reviewed and are negative.    Physical Exam Updated Vital Signs BP 159/73   Pulse 79   Temp 97.5 F (36.4 C) (Oral)   Resp 17   Ht 5\' 4"  (1.626 m)   Wt 141 lb 3.2 oz (64 kg)   SpO2 100%   BMI 24.24 kg/m   Physical Exam  Constitutional: She is oriented to person, place, and time. She appears well-developed and well-nourished.  HENT:  Head: Normocephalic and atraumatic.  Mouth/Throat: Oropharynx is clear and moist. No oropharyngeal exudate.  Eyes: EOM are normal. Pupils are equal, round, and reactive to light.  Neck: Normal range of motion. Neck supple. No JVD present.  Cardiovascular: Normal  rate and regular rhythm.  Exam reveals no gallop and no friction rub.   No murmur heard. Pulmonary/Chest: Effort normal and breath sounds normal. No respiratory distress. She has no wheezes. She has no rales. She exhibits no tenderness.  Abdominal: Soft. Bowel sounds are normal. There is no tenderness. There is no rebound and no guarding.  Musculoskeletal: Normal range of motion. She exhibits no edema or tenderness.  No lower extremity swelling, asymmetry or tenderness.  Neurological: She is alert and oriented to person, place, and time.  5/5 motor in all extremity. sensation is fully intact.  Skin: Skin is warm and dry. Capillary refill takes less than 2 seconds. No rash noted. No erythema.  Psychiatric: She has a normal mood and affect. Her behavior is normal.  Nursing note and vitals reviewed.    ED Treatments / Results  Labs (all labs ordered are listed, but only  abnormal results are displayed) Labs Reviewed  BASIC METABOLIC PANEL - Abnormal; Notable for the following:       Result Value   Glucose, Bld 145 (*)    Creatinine, Ser 1.16 (*)    Calcium 10.9 (*)    GFR calc non Af Amer 45 (*)    GFR calc Af Amer 52 (*)    All other components within normal limits  CBC - Abnormal; Notable for the following:    RDW 19.2 (*)    All other components within normal limits  PROTIME-INR - Abnormal; Notable for the following:    Prothrombin Time 17.4 (*)    All other components within normal limits  CBG MONITORING, ED - Abnormal; Notable for the following:    Glucose-Capillary 158 (*)    All other components within normal limits  I-STAT CG4 LACTIC ACID, ED - Abnormal; Notable for the following:    Lactic Acid, Venous 2.17 (*)    All other components within normal limits  URINALYSIS, ROUTINE W REFLEX MICROSCOPIC (NOT AT [facility])  I-STAT TROPOININ, ED    EKG  EKG Interpretation  Date/Time:  Thursday December 05 2015 16:01:27 EDT Ventricular Rate:  77 PR Interval:    QRS Duration: 95 QT Interval:  430 QTC Calculation: 487 R Axis:   53 Text Interpretation:  Atrial-paced rhythm LVH with secondary repolarization abnormality Borderline prolonged QT interval No significant change since last tracing Confirmed by Zenia Resides  MD, ANTHONY ([id number]) on 12/05/2015 4:07:01 PM       Radiology Dg Chest Port 1 View  Result Date: 12/05/2015 CLINICAL DATA:  Chest pain and shortness of Breath EXAM: PORTABLE CHEST 1 VIEW COMPARISON:  12/02/2015 FINDINGS: Cardiac shadow is stable. Bilateral breast implants are noted. A pacemaker is again seen and stable. Postsurgical changes are again noted. No focal infiltrate or sizable effusion is seen. No bony abnormality is noted. IMPRESSION: No acute abnormality seen. No significant change from the prior exam. Electronically Signed   By: Inez Catalina M.D.   On: 12/05/2015 16:58    Procedures Procedures (including critical care  time) CRITICAL CARE Performed by: Lita Mains, Sarely Stracener Total critical care time: 35 minutes Critical care time was exclusive of separately billable procedures and treating other patients. Critical care was necessary to treat or prevent imminent or life-threatening deterioration. Critical care was time spent personally by me on the following activities: development of treatment plan with patient and/or surrogate as well as nursing, discussions with consultants, evaluation of patient's response to treatment, examination of patient, obtaining history from patient or surrogate, ordering and performing treatments and interventions,  ordering and review of laboratory studies, ordering and review of radiographic studies, pulse oximetry and re-evaluation of patient's condition. Medications Ordered in ED Medications - No data to display   Initial Impression / Assessment and Plan / ED Course  I have reviewed the triage vital signs and the nursing notes.  Pertinent labs & imaging results that were available during my care of the patient were reviewed by me and considered in my medical decision making (see chart for details).  Clinical Course   Patient was hypotensive with systolic blood pressure in the 70s. States she was feeling very lightheaded. She is laid flat and given IV fluids. Blood pressure has improved and patient states she is feeling much better. Suspect that her hypotension, lightheadedness and syncope may be related to her recent blood pressure medication changes and taking NTG. Patient will warrant observation overnight. Discussed with Dr. Marland Kitchen Will admit to telemetry observation bed. Patient's vital signs remained stable and she is chest pain-free. Final Clinical Impressions(s) / ED Diagnoses   Final diagnoses:  Hypotension, unspecified hypotension type  Chest pain in adult  Syncope, unspecified syncope type    New Prescriptions New Prescriptions   No medications on file     Julianne Rice, MD 12/05/15 1846

## 2015-12-06 ENCOUNTER — Observation Stay (HOSPITAL_BASED_OUTPATIENT_CLINIC_OR_DEPARTMENT_OTHER): Payer: Commercial Managed Care - HMO

## 2015-12-06 DIAGNOSIS — R55 Syncope and collapse: Secondary | ICD-10-CM | POA: Diagnosis not present

## 2015-12-06 DIAGNOSIS — R0989 Other specified symptoms and signs involving the circulatory and respiratory systems: Secondary | ICD-10-CM

## 2015-12-06 DIAGNOSIS — I959 Hypotension, unspecified: Secondary | ICD-10-CM | POA: Diagnosis not present

## 2015-12-06 LAB — BASIC METABOLIC PANEL
Anion gap: 6 (ref 5–15)
BUN: 16 mg/dL (ref 6–20)
CO2: 25 mmol/L (ref 22–32)
Calcium: 10 mg/dL (ref 8.9–10.3)
Chloride: 106 mmol/L (ref 101–111)
Creatinine, Ser: 0.83 mg/dL (ref 0.44–1.00)
GFR calc Af Amer: >60 mL/min (ref >60)
GFR calc non Af Amer: >60 mL/min (ref >60)
Glucose, Bld: 111 mg/dL — ABNORMAL HIGH (ref 65–99)
Potassium: 3.5 mmol/L (ref 3.5–5.1)
Sodium: 137 mmol/L (ref 135–145)

## 2015-12-06 LAB — TROPONIN I: Troponin I: <0.03 ng/mL (ref <0.03)

## 2015-12-06 LAB — GLUCOSE, CAPILLARY
Glucose-Capillary: 108 mg/dL — ABNORMAL HIGH (ref 65–99)
Glucose-Capillary: 117 mg/dL — ABNORMAL HIGH (ref 65–99)

## 2015-12-06 MED ORDER — LISINOPRIL 20 MG PO TABS
20.0000 mg | ORAL_TABLET | Freq: Every day | ORAL | Status: DC
  Administered 2015-12-06 – 2015-12-07 (×2): 20 mg via ORAL
  Filled 2015-12-06 (×2): qty 1

## 2015-12-06 NOTE — Progress Notes (Signed)
*  PRELIMINARY RESULTS* Vascular Ultrasound Carotid Duplex (Doppler) has been completed.  Findings suggest 1-39% internal carotid artery stenosis bilaterally. Vertebral arteries are patent with antegrade flow.    12/06/2015 5:03 PM Maudry Mayhew, BS, RVT, RDCS, RDMS

## 2015-12-06 NOTE — Progress Notes (Signed)
Patient ID: [name], female   DOB: [birth year], 75 y.o.   MRN: [id number]    PROGRESS NOTE    KAMYLLE DIDIER  [id number] DOB: 1940-02-13 DOA: 12/05/2015  PCP: [name], MD   Brief Narrative:  75 y.o. female with medical history significant for hypertension, coronary artery disease status post CABG, chronic diastolic CHF, and paroxysmal atrial fibrillation who presents the emergency department following a syncopal episode. Patient was just discharged from the hospital yesterday after she was managed for hyperkalemia and hypertensive urgency. Blood pressures were very difficult to control during the admission and the patient has allergy and intolerance to multiple agents. Cardiology consultation was obtained during the recent hospitalization. She was discharged yesterday after Toprol was switched to Coreg and HCTZ was added. She reports feeling well at time of discharge and went to bed last night in her usual state of health. She reports waking this morning with some lightheadedness and mild malaise. These symptoms progressed throughout the course in the morning and patient went to the pharmacy to pick up her new prescriptions of Coreg and HCTZ. She took her first dose of these medications at the pharmacy and was then driving back home when she had severe worsening in her lightheadedness and malaise, feeling as though she may lose consciousness. She was driving past [facility] and pulled into the parking lot. She called her daughter and her physician from the parking lot after she had briefly lost consciousness, and was directed to the ED.   Assessment & Plan:  1. Syncope  - Suspected secondary to hypotension, likely influenced by her antihypertensive medications  - She was very lightheaded and pre-syncopal initially, but sxs resolved as BP came up  - There are no focal neurologic deficits so far - ECHO with stable EF and grade I diastolic CHF - increase the dose of Lisinopril     2. Hypertension, essential  - Pt recently admitted with very difficult to manage hypertensive urgency; cardiology was consulted and pt was discharged with Coreg replacing Toprol, and with the addition of HCTZ - She was hypotensive in ED after a syncopal episode  - Plan to continue holding Coreg and HCTZ, continue lisinopril as noted above and monitor   3. Atrial fibrillation, paroxysmal  - CHADS-VASc at least 59 (age x2, gender, CHF, HTN, CAD)  - Continue AC with Xarelto; continue amiodarone  - Monitoring on telemetry    4. Chronic diastolic CHF - Appears dry on admission and is being gently hydrated  - TTE (12/04/15) with EF 0000000, grade 1 diastolic dysfunction, moderate AI - Managed with lisinopril and Coreg; Coreg is held as noted above  - Follow I/O's and daily wts    5. CAD - pt is s/p CABG in November 2016, 2 stents placed in April 2017; stress test September 2017 neg for ischemia  - No anginal complaints on admission, though pt had experienced central chest discomfort just PTA and took a NTG for the first time  - EKG not significantly changed from prior and troponin is undetectable    DVT prophylaxis: Xarelto  Code Status: Partial, no intubation  Family Communication: pt at bedside  Disposition Plan: Home in AM  Consultants:   None  Procedures:   None  Antimicrobials:   None  Subjective: No events overnight.   Objective: Vitals:   12/05/15 2135 12/05/15 2241 12/06/15 0500 12/06/15 1313  BP: (!) 156/76  (!) 173/90 (!) 148/67  Pulse: 78  75 75  Resp:  16  16 16   Temp:  97.6 F (36.4 C) 98.2 F (36.8 C) 98.2 F (36.8 C)  TempSrc:   Oral Oral  SpO2: 97%  95% 96%  Weight:   51.1 kg (112 lb 10.5 oz)   Height:        Intake/Output Summary (Last 24 hours) at 12/06/15 1840 Last data filed at 12/06/15 1100  Gross per 24 hour  Intake             1218 ml  Output                0 ml  Net             1218 ml   Filed Weights   12/05/15 1601 12/05/15  2100 12/06/15 0500  Weight: 64 kg (141 lb 3.2 oz) 62.3 kg (137 lb 5.6 oz) 51.1 kg (112 lb 10.5 oz)    Examination:  General exam: Appears calm and comfortable  Respiratory system: Clear to auscultation. Respiratory effort normal. Cardiovascular system: S1 & S2 heard, RRR. No JVD, rubs, gallops or clicks. No pedal edema. SEM 2/6. Gastrointestinal system: Abdomen is nondistended, soft and nontender. No organomegaly or masses felt.  Central nervous system: Alert and oriented. No focal neurological deficits.  Data Reviewed: I have personally reviewed following labs and imaging studies  CBC:  Recent Labs Lab 12/02/15 1523 12/04/15 0321 12/05/15 1606  WBC 4.3 5.3 5.5  HGB 12.7 12.0 12.4  HCT 39.0 36.5 38.6  MCV 84.2 83.1 84.1  PLT 248 222 [id number]   Basic Metabolic Panel:  Recent Labs Lab 12/02/15 1523 12/03/15 1626 12/04/15 0321 12/05/15 1606 12/06/15 0404  NA 141 137 139 137 137  K 5.7* 3.8 4.4 3.7 3.5  CL 105 103 102 102 106  CO2 29 28 29 26 25   GLUCOSE 110* 90 121* 145* 111*  BUN 16 7 12 19 16   CREATININE 0.97 0.92 0.92 1.16* 0.83  CALCIUM 10.9* 10.5* 10.3 10.9* 10.0   Liver Function Tests:  Recent Labs Lab 12/04/15 0321  AST 22  ALT 19  ALKPHOS 95  BILITOT 0.8  PROT 5.9*  ALBUMIN 3.4*   Coagulation Profile:  Recent Labs Lab 12/05/15 1606  INR 1.41   Cardiac Enzymes:  Recent Labs Lab 12/03/15 0154 12/03/15 0412 12/03/15 0717 12/05/15 2051 12/06/15 0404  TROPONINI <0.03 <0.03 <0.03 <0.03 <0.03   CBG:  Recent Labs Lab 12/02/15 2250 12/05/15 1615 12/06/15 0526 12/06/15 0739  GLUCAP 112* 158* 108* 117*   Urine analysis:    Component Value Date/Time   COLORURINE YELLOW 12/05/2015 1600   APPEARANCEUR CLEAR 12/05/2015 1600   LABSPEC 1.013 12/05/2015 1600   PHURINE 6.5 12/05/2015 1600   GLUCOSEU NEGATIVE 12/05/2015 1600   GLUCOSEU NEGATIVE 12/31/2009 1612   HGBUR NEGATIVE 12/05/2015 1600   BILIRUBINUR NEGATIVE 12/05/2015 1600    KETONESUR NEGATIVE 12/05/2015 1600   PROTEINUR NEGATIVE 12/05/2015 1600   UROBILINOGEN 1.0 06/03/2012 1610   NITRITE NEGATIVE 12/05/2015 1600   LEUKOCYTESUR NEGATIVE 12/05/2015 1600   Radiology Studies: Dg Chest Port 1 View  Result Date: 12/05/2015 CLINICAL DATA:  Chest pain and shortness of Breath EXAM: PORTABLE CHEST 1 VIEW COMPARISON:  12/02/2015 FINDINGS: Cardiac shadow is stable. Bilateral breast implants are noted. A pacemaker is again seen and stable. Postsurgical changes are again noted. No focal infiltrate or sizable effusion is seen. No bony abnormality is noted. IMPRESSION: No acute abnormality seen. No significant change from the prior exam. Electronically Signed   By: Elta [place]  Lukens M.D.   On: 12/05/2015 16:58   Scheduled Meds: . amiodarone  100 mg Oral Daily  . cholecalciferol  1,000 Units Oral Daily  . citalopram  10 mg Oral Daily  . clopidogrel  75 mg Oral Daily  . lisinopril  20 mg Oral Daily  . multivitamin with minerals  1 tablet Oral Daily  . Rivaroxaban  15 mg Oral Q supper  . sodium chloride flush  3 mL Intravenous Q12H   Continuous Infusions:    LOS: 0 days    Time spent: 20 minutes    Faye Ramsay, MD [facility] Pager [phone]  If 7PM-7AM, please contact night-coverage [email or web address] Password [facility] 12/06/2015, 6:40 PM

## 2015-12-06 NOTE — Progress Notes (Signed)
Pt states she is "not going home until a Cardiologist talks to me." Hospitalist made aware of patient's request for Cardiology consult. Per MD, Cardiology will see patient tomorrow. Patient aware.

## 2015-12-07 ENCOUNTER — Encounter (HOSPITAL_COMMUNITY): Payer: Self-pay | Admitting: Cardiology

## 2015-12-07 DIAGNOSIS — R55 Syncope and collapse: Secondary | ICD-10-CM

## 2015-12-07 LAB — BASIC METABOLIC PANEL
Anion gap: 7 (ref 5–15)
BUN: 15 mg/dL (ref 6–20)
CO2: 25 mmol/L (ref 22–32)
Calcium: 10.5 mg/dL — ABNORMAL HIGH (ref 8.9–10.3)
Chloride: 106 mmol/L (ref 101–111)
Creatinine, Ser: 0.8 mg/dL (ref 0.44–1.00)
GFR calc Af Amer: >60 mL/min (ref >60)
GFR calc non Af Amer: >60 mL/min (ref >60)
Glucose, Bld: 109 mg/dL — ABNORMAL HIGH (ref 65–99)
Potassium: 4.2 mmol/L (ref 3.5–5.1)
Sodium: 138 mmol/L (ref 135–145)

## 2015-12-07 LAB — VAS US CAROTID
LEFT ECA DIAS: -7 cm/s
Left CCA dist dias: -17 cm/s
Left CCA dist sys: -83 cm/s
Left CCA prox dias: 14 cm/s
Left CCA prox sys: 80 cm/s
Left ICA dist dias: -18 cm/s
Left ICA dist sys: -54 cm/s
RIGHT ECA DIAS: -7 cm/s
RIGHT VERTEBRAL DIAS: 16 cm/s
Right CCA prox dias: 10 cm/s
Right CCA prox sys: 68 cm/s
Right cca dist sys: -59 cm/s

## 2015-12-07 LAB — CBC
HCT: 37.8 % (ref 36.0–46.0)
Hemoglobin: 12.3 g/dL (ref 12.0–15.0)
MCH: 27.5 pg (ref 26.0–34.0)
MCHC: 32.5 g/dL (ref 30.0–36.0)
MCV: 84.4 fL (ref 78.0–100.0)
Platelets: 241 10*3/uL (ref 150–400)
RBC: 4.48 MIL/uL (ref 3.87–5.11)
RDW: 19.1 % — ABNORMAL HIGH (ref 11.5–15.5)
WBC: 4.7 10*3/uL (ref 4.0–10.5)

## 2015-12-07 LAB — GLUCOSE, CAPILLARY: Glucose-Capillary: 160 mg/dL — ABNORMAL HIGH (ref 65–99)

## 2015-12-07 MED ORDER — CARVEDILOL 12.5 MG PO TABS
12.5000 mg | ORAL_TABLET | Freq: Two times a day (BID) | ORAL | Status: DC
  Administered 2015-12-07: 12.5 mg via ORAL
  Filled 2015-12-07: qty 1

## 2015-12-07 MED ORDER — CARVEDILOL 6.25 MG PO TABS
6.2500 mg | ORAL_TABLET | Freq: Two times a day (BID) | ORAL | Status: DC
  Administered 2015-12-07 – 2015-12-09 (×4): 6.25 mg via ORAL
  Filled 2015-12-07 (×4): qty 1

## 2015-12-07 MED ORDER — LISINOPRIL 20 MG PO TABS
30.0000 mg | ORAL_TABLET | Freq: Every day | ORAL | Status: DC
  Administered 2015-12-08: 30 mg via ORAL
  Filled 2015-12-07: qty 1

## 2015-12-07 NOTE — Progress Notes (Signed)
Patient ID: [name], female   DOB: 08-05-1940, 75 y.o.   MRN: [id number]    PROGRESS NOTE    [name]  [id number] DOB: 1940/05/15 DOA: 12/05/2015  PCP: [name], MD   Brief Narrative:  75 y.o. female with medical history significant for hypertension, coronary artery disease status post CABG, chronic diastolic CHF, and paroxysmal atrial fibrillation who presents the emergency department following a syncopal episode. Patient was just discharged from the hospital yesterday after she was managed for hyperkalemia and hypertensive urgency. Blood pressures were very difficult to control during the admission and the patient has allergy and intolerance to multiple agents. Cardiology consultation was obtained during the recent hospitalization. She was discharged yesterday after Toprol was switched to Coreg and HCTZ was added. She reports feeling well at time of discharge and went to bed last night in her usual state of health. She reports waking this morning with some lightheadedness and mild malaise. These symptoms progressed throughout the course in the morning and patient went to the pharmacy to pick up her new prescriptions of Coreg and HCTZ. She took her first dose of these medications at the pharmacy and was then driving back home when she had severe worsening in her lightheadedness and malaise, feeling as though she may lose consciousness. She was driving past [facility] and pulled into the parking lot. She called her daughter and her physician from the parking lot after she had briefly lost consciousness, and was directed to the ED.   Assessment & Plan:  1. Syncope  - Suspected secondary to hypotension, likely influenced by her antihypertensive medications  - orthostatic this AM - ECHO with stable EF and grade I diastolic CHF - PT eval done, no further recommendations   2. Hypertension, essential  - Pt recently admitted with very difficult to manage hypertensive  urgency; cardiology was consulted and pt was discharged with Coreg replacing Toprol, and with the addition of HCTZ - She was hypotensive in ED after a syncopal episode  - I have added Coreg lower dose, increased the dose of Lisinopril - continue to hold HCTZ  3. Atrial fibrillation, paroxysmal  - CHADS-VASc at least 72 (age x2, gender, CHF, HTN, CAD)  - Continue AC with Xarelto; continue amiodarone  - Monitoring on telemetry   - added lower dose Coreg  4. Chronic diastolic CHF - no signs of volume overload on exam this AM - TTE (12/04/15) with EF 0000000, grade 1 diastolic dysfunction, moderate AI - Managed with lisinopril and Coreg; Coreg is held as noted above  - Follow I/O's and daily wts    5. CAD - pt is s/p CABG in November 2016, 2 stents placed in April 2017; stress test September 2017 neg for ischemia  - No anginal complaints on admission - EKG not significantly changed from prior and troponin is undetectable   DVT prophylaxis: Xarelto  Code Status: Partial, no intubation  Family Communication: pt at bedside  Disposition Plan: Home in AM  Consultants:   Cardiology   Procedures:   None  Antimicrobials:   None  Subjective: No events overnight.   Objective: Vitals:   12/06/15 2132 12/07/15 0633 12/07/15 1104 12/07/15 1221  BP: (!) 177/90 (!) 164/90    Pulse: 77 77 76 75  Resp: 16 17 18    Temp: 99.1 F (37.3 C) 98.6 F (37 C) 98.4 F (36.9 C)   TempSrc: Oral Oral Oral   SpO2: 96% 97% 97%   Weight:  Height:        Intake/Output Summary (Last 24 hours) at 12/07/15 1418 Last data filed at 12/07/15 0921  Gross per 24 hour  Intake              480 ml  Output                0 ml  Net              480 ml   Filed Weights   12/05/15 1601 12/05/15 2100 12/06/15 0500  Weight: 64 kg (141 lb 3.2 oz) 62.3 kg (137 lb 5.6 oz) 51.1 kg (112 lb 10.5 oz)    Examination:  General exam: Appears calm and comfortable  Respiratory system: Clear to  auscultation. Respiratory effort normal. Cardiovascular system: S1 & S2 heard, RRR. No JVD, rubs, gallops or clicks. No pedal edema. SEM 2/6. Gastrointestinal system: Abdomen is nondistended, soft and nontender. No organomegaly or masses felt.  Central nervous system: Alert and oriented. No focal neurological deficits.  Data Reviewed: I have personally reviewed following labs and imaging studies  CBC:  Recent Labs Lab 12/02/15 1523 12/04/15 0321 12/05/15 1606 12/07/15 0517  WBC 4.3 5.3 5.5 4.7  HGB 12.7 12.0 12.4 12.3  HCT 39.0 36.5 38.6 37.8  MCV 84.2 83.1 84.1 84.4  PLT 248 222 257 [id number]   Basic Metabolic Panel:  Recent Labs Lab 12/03/15 1626 12/04/15 0321 12/05/15 1606 12/06/15 0404 12/07/15 0517  NA 137 139 137 137 138  K 3.8 4.4 3.7 3.5 4.2  CL 103 102 102 106 106  CO2 28 29 26 25 25   GLUCOSE 90 121* 145* 111* 109*  BUN 7 12 19 16 15   CREATININE 0.92 0.92 1.16* 0.83 0.80  CALCIUM 10.5* 10.3 10.9* 10.0 10.5*   Liver Function Tests:  Recent Labs Lab 12/04/15 0321  AST 22  ALT 19  ALKPHOS 95  BILITOT 0.8  PROT 5.9*  ALBUMIN 3.4*   Coagulation Profile:  Recent Labs Lab 12/05/15 1606  INR 1.41   Cardiac Enzymes:  Recent Labs Lab 12/03/15 0154 12/03/15 0412 12/03/15 0717 12/05/15 2051 12/06/15 0404  TROPONINI <0.03 <0.03 <0.03 <0.03 <0.03   CBG:  Recent Labs Lab 12/02/15 2250 12/05/15 1615 12/06/15 0526 12/06/15 0739 12/07/15 0752  GLUCAP 112* 158* 108* 117* 160*   Urine analysis:    Component Value Date/Time   COLORURINE YELLOW 12/05/2015 1600   APPEARANCEUR CLEAR 12/05/2015 1600   LABSPEC 1.013 12/05/2015 1600   PHURINE 6.5 12/05/2015 1600   GLUCOSEU NEGATIVE 12/05/2015 1600   GLUCOSEU NEGATIVE 12/31/2009 1612   HGBUR NEGATIVE 12/05/2015 1600   BILIRUBINUR NEGATIVE 12/05/2015 1600   KETONESUR NEGATIVE 12/05/2015 1600   PROTEINUR NEGATIVE 12/05/2015 1600   UROBILINOGEN 1.0 06/03/2012 1610   NITRITE NEGATIVE 12/05/2015 1600    LEUKOCYTESUR NEGATIVE 12/05/2015 1600   Radiology Studies: Dg Chest Port 1 View  Result Date: 12/05/2015 CLINICAL DATA:  Chest pain and shortness of Breath EXAM: PORTABLE CHEST 1 VIEW COMPARISON:  12/02/2015 FINDINGS: Cardiac shadow is stable. Bilateral breast implants are noted. A pacemaker is again seen and stable. Postsurgical changes are again noted. No focal infiltrate or sizable effusion is seen. No bony abnormality is noted. IMPRESSION: No acute abnormality seen. No significant change from the prior exam. Electronically Signed   By: Inez Catalina M.D.   On: 12/05/2015 16:58   Scheduled Meds: . amiodarone  100 mg Oral Daily  . carvedilol  12.5 mg Oral BID WC  . cholecalciferol  1,000 Units Oral Daily  . citalopram  10 mg Oral Daily  . clopidogrel  75 mg Oral Daily  . [START ON 12/08/2015] lisinopril  30 mg Oral Daily  . multivitamin with minerals  1 tablet Oral Daily  . Rivaroxaban  15 mg Oral Q supper  . sodium chloride flush  3 mL Intravenous Q12H   Continuous Infusions:    LOS: 0 days    Time spent: 20 minutes    Faye Ramsay, MD [facility] Pager [phone]  If 7PM-7AM, please contact night-coverage [email or web address] Password [email or web address] 12/07/2015, 2:18 PM

## 2015-12-07 NOTE — Consult Note (Signed)
CARDIOLOGY CONSULT NOTE  Patient ID: [name] MRN: [id number] DOB/AGE: October 19, 1940 75 y.o.  Admit date: 12/05/2015 Primary Physician [name], MD Primary Cardiologist   Dr. Sallyanne Kuster  Chief Complaint  Syncope Requesting  Dr. Mart Piggs  HPI:   The patient presents after a syncopal episode.  She was discharged from the hospital on 10/25 after a hospitalization for multiple complaints including severe headache, chest pain and syncope.    During her hospitalization she had med changes for her difficult to control HTN.  She has been intolerant of Norvasc and clonidine.  She had hyperkalemia and was taken off of spironolactone.  During the recent admit she was taken off of Toprol and started on Coreg.  She was also sent home on HCTZ.   Other issues included hyperkalemia.    She felt OK the night of discharge but in the morning was somewhat weak and slept.  She drove to the pharmacy to get her prescriptions and then drove to a middle school to pick up her fiances daughter.  She took her meds and while driving felt very weak and whoozy.  She drove into the parking lot at [facility] and called our office.  She apparently had LOC and remembers her son in law being at the car and other people.  She did not have palpitations.  She was having some discomfort in her upper epigastric region.  She actually took a NTG before help arrived and before she had LOC.   Past Medical History:  Diagnosis Date  . ABDOMINAL PAIN, LOWER 12/31/2009  . ANXIETY 12/31/2009  . Aortic insufficiency   . CORONARY ARTERY DISEASE 12/31/2009  . DEPRESSION 12/31/2009  . [place] DISEASE, CERVICAL 12/31/2009  . [place] DISEASE, LUMBAR 12/31/2009  . DIVERTICULITIS, HX OF 12/31/2009  . GLUCOSE INTOLERANCE 12/31/2009  . HYPERLIPIDEMIA 12/31/2009  . HYPERTENSION 12/31/2009  . MENOPAUSE, EARLY 12/31/2009  . MITRAL REGURGITATION 12/31/2009  . MITRAL VALVE PROLAPSE 12/31/2009  . NSTEMI (non-ST elevated myocardial infarction)  ([place]) 05/11/2015  . OSTEOARTHRITIS, HAND 12/31/2009  . Pacemaker 01/21/2012   MDT Adapta dual chamber  . Persistent atrial fibrillation ([place]) 01/22/2012  . PVD (peripheral vascular disease), hx stents to bil SFAs 02/2010 01/22/2012  . Symptomatic sinus bradycardia 01/22/2012  . Tricuspid regurgitation   . Unspecified urinary incontinence 12/31/2009    Past Surgical History:  Procedure Laterality Date  . AUGMENTATION MAMMAPLASTY  1983  . CARDIAC CATHETERIZATION  2005   "negative" (01/21/2012)  . CARDIAC CATHETERIZATION  09/17/1999   Mild CAD involving proximal portion of LAD. Mitral valve prolapse w/ mitral regurg. No evidence of renal artery stenosis. Recommended readjustment of antihypertensive medications.  Marland Kitchen CARDIAC CATHETERIZATION N/A 01/01/2015   Procedure: Left Heart Cath and Coronary Angiography;  Surgeon: Jettie Booze, MD;  Location: [place] CV LAB;  Service: Cardiovascular;  Laterality: N/A;  . CARDIAC CATHETERIZATION N/A 05/12/2015   Procedure: Left Heart Cath and Coronary Angiography;  Surgeon: Troy Sine, MD;  Location: [place] CV LAB;  Service: Cardiovascular;  Laterality: N/A;  . CARDIAC CATHETERIZATION N/A 05/12/2015   Procedure: Coronary Stent Intervention;  Surgeon: Troy Sine, MD;  Location: [place] [facility] LAB;  Service: Cardiovascular;  Laterality: N/A;  . CARDIAC SURGERY     11/20/20116  . CARDIOVERSION N/A 02/01/2015   Procedure: CARDIOVERSION;  Surgeon: Lelon Perla, MD;  Location: [facility] ENDOSCOPY;  Service: Cardiovascular;  Laterality: N/A;  . CARDIOVERSION N/A 08/30/2015   Procedure: CARDIOVERSION;  Surgeon: Sanda Klein, MD;  Location: [place] ENDOSCOPY;  Service: Cardiovascular;  Laterality: N/A;  . CARPAL TUNNEL RELEASE  2008   "right hand/thumb; carpal tunnel repair; got rid of arthritis" (01/21/2012)  . CLIPPING OF ATRIAL APPENDAGE N/A 01/04/2015   Procedure: CLIPPING OF ATRIAL APPENDAGE;  Surgeon: Grace Isaac, MD;  Location: [place];   Service: Open Heart Surgery;  Laterality: N/A;  . CORONARY ARTERY BYPASS GRAFT N/A 01/04/2015   Procedure: CORONARY ARTERY BYPASS GRAFTING (CABG) x 3 using left internal mammory artery and greater saphenous vein right leg harvested endoscopically.;  Surgeon: Grace Isaac, MD;  LIMA-LAD, SVG-RI, SVG-PDA  . FACELIFT, LOWER 2/3  1995   "mini" (01/21/2012)  . Lower Arterial Examination  10/28/2011   R. SFA stent mild-moderate mixed density plaque with elevated velocities consistent with 50% diameter reduction. L. SFA stent moderate mixed denisty plaque at mid to distal level consistent with 50-69% diameter reduction.  . OOPHORECTOMY  ~1979  . PACEMAKER INSERTION  01/21/2012   Medtronic Adapta, model# ADDRL1, serial# [id number]  . PARTIAL COLECTOMY  2010  . PERIPHERAL ARTERIAL STENT GRAFT  2012; 2012   "LLE; RLE" (01/21/2012)  . PERMANENT PACEMAKER INSERTION N/A 01/21/2012   Medtronic Adapta L implanted by Dr Sallyanne Kuster for tachy/brady syndrome  . Persantine Myoview Stress Test  02/25/2010   No scintigraphic evidence of inducible myocardial ischemia. No persantine EKG changes. Non-diagnositc for ishcemia. Low-risk, normal scan.  Marland Kitchen POSTERIOR CERVICAL LAMINECTOMY  1985  . TEE WITHOUT CARDIOVERSION N/A 01/04/2015   Procedure: TRANSESOPHAGEAL ECHOCARDIOGRAM (TEE);  Surgeon: Grace Isaac, MD;  Location: [place];  Service: Open Heart Surgery;  Laterality: N/A;  . TEE WITHOUT CARDIOVERSION N/A 02/01/2015   Procedure: TRANSESOPHAGEAL ECHOCARDIOGRAM (TEE);  Surgeon: Lelon Perla, MD;  Location: [facility] ENDOSCOPY;  Service: Cardiovascular;  Laterality: N/A;  . TEE WITHOUT CARDIOVERSION N/A 08/30/2015   Procedure: TRANSESOPHAGEAL ECHOCARDIOGRAM (TEE);  Surgeon: Sanda Klein, MD;  Location: [facility] ENDOSCOPY;  Service: Cardiovascular;  Laterality: N/A;  . VAGINAL HYSTERECTOMY  1975    Allergies  Allergen Reactions  . Brilinta [Ticagrelor] Shortness Of Breath  . Clonidine Derivatives Other (See Comments)     Lowers heart rate  . Atorvastatin Other (See Comments)    Possible cause of fatigue/malaise  . Crestor [Rosuvastatin Calcium] Other (See Comments)    Joint/muscle aches  . Exforge [Amlodipine Besylate-Valsartan] Itching and Rash   Prescriptions Prior to Admission  Medication Sig Dispense Refill Last Dose  . amiodarone (PACERONE) 100 MG tablet Take 1 tablet (100 mg total) by mouth daily. 30 tablet 11 12/05/2015 at 0900  . carvedilol (COREG) 25 MG tablet Take 1 tablet (25 mg total) by mouth 2 (two) times daily with a meal. 60 tablet 1 12/05/2015 at 1400  . cholecalciferol (VITAMIN D) 1000 UNITS tablet Take 1,000 Units by mouth daily.   12/04/2015 at Unknown time  . citalopram (CELEXA) 20 MG tablet Take 1 tablet (20 mg total) by mouth daily. (Patient taking differently: Take 10 mg by mouth daily. ) 90 tablet 0 12/05/2015 at Unknown time  . clopidogrel (PLAVIX) 75 MG tablet TAKE 1 TABLET (75 MG TOTAL) BY MOUTH DAILY. 90 tablet 1 12/05/2015 at 0900  . hydrochlorothiazide (HYDRODIURIL) 25 MG tablet Take 1 tablet (25 mg total) by mouth daily. 30 tablet 1 12/05/2015 at Unknown time  . lisinopril (PRINIVIL,ZESTRIL) 20 MG tablet Take 1 tablet (20 mg total) by mouth daily. (Patient taking differently: Take 10 mg by mouth daily. ) 90 tablet 0 12/05/2015 at Unknown time  . Multiple  Vitamin (MULTIVITAMIN) capsule Take 1 capsule by mouth daily.   12/04/2015 at Unknown time  . nitroGLYCERIN (NITROSTAT) 0.4 MG SL tablet Place 1 tablet (0.4 mg total) under the tongue every 5 (five) minutes x 3 doses as needed for chest pain. 25 tablet 2 12/05/2015 at Unknown time  . rivaroxaban (XARELTO) 15 MG TABS tablet Take 1 tablet (15 mg total) by mouth daily with supper. 30 tablet 5 12/04/2015 at 2030  . Coenzyme Q10 (COQ10 PO) Take 120 mg by mouth daily.   unknown at Unknown time   Family History  Problem Relation Age of Onset  . Heart disease Mother   . Hypertension Mother   . Stroke Mother   . Stroke Father   .  Heart disease Father   . Heart disease Brother   . Hypertension Brother     2 brothers  . CVA Brother     2 brothers  . Heart attack Brother     2 brothers    Social History   Social History  . Marital status: Widowed    Spouse name: N/A  . Number of children: 3  . Years of education: N/A   Occupational History  . retired Chemical engineer Retired   Social History Main Topics  . Smoking status: Former Smoker    Packs/day: 0.75    Years: 10.00    Types: Cigarettes  . Smokeless tobacco: Never Used     Comment: 01/21/2012 "quit smoking ~ 2002"  . Alcohol use Yes     Comment: 01/21/2012 "3 times/wk I have a couple mixed drinks"; 06/28/2015 "nothing in the last 3 weeks cause I haven't felt good", reduced recently  . Drug use: No  . Sexual activity: Not Currently   Other Topics Concern  . Not on file   Social History Narrative   Lives in [place].  Retired.     ROS:  Stress.  Otherwise as stated in the HPI and negative for all other systems.  Physical Exam: Blood pressure (!) 164/90, pulse 77, temperature 98.6 F (37 C), temperature source Oral, resp. rate 17, height 5' 4.5" (1.638 m), weight 112 lb 10.5 oz (51.1 kg), SpO2 97 %.  GENERAL:  Well appearing HEENT:  Pupils equal round and reactive, fundi not visualized, oral mucosa unremarkable NECK:  No jugular venous distention, waveform within normal limits, carotid upstroke brisk and symmetric, no bruits, no thyromegaly LYMPHATICS:  No cervical, inguinal adenopathy LUNGS:  Clear to auscultation bilaterally BACK:  No CVA tenderness CHEST:  Unremarkable HEART:  PMI not displaced or sustained,S1 and S2 within normal limits, no S3, no S4, no clicks, no rubs, no murmurs ABD:  Flat, positive bowel sounds normal in frequency in pitch, no bruits, no rebound, no guarding, no midline pulsatile mass, no hepatomegaly, no splenomegaly EXT:  2 plus pulses throughout, no edema, no cyanosis no clubbing SKIN:  No rashes no  nodules NEURO:  Cranial nerves II through XII grossly intact, motor grossly intact throughout PSYCH:  Cognitively intact, oriented to person place and time  Labs: Lab Results  Component Value Date   BUN 15 12/07/2015   Lab Results  Component Value Date   CREATININE 0.80 12/07/2015   Lab Results  Component Value Date   NA 138 12/07/2015   K 4.2 12/07/2015   CL 106 12/07/2015   CO2 25 12/07/2015   Lab Results  Component Value Date   TROPONINI <0.03 12/06/2015   Lab Results  Component Value Date   WBC 4.7  12/07/2015   HGB 12.3 12/07/2015   HCT 37.8 12/07/2015   MCV 84.4 12/07/2015   PLT 241 12/07/2015   Lab Results  Component Value Date   CHOL 158 05/12/2015   HDL 64 05/12/2015   LDLCALC 71 05/12/2015   TRIG 114 05/12/2015   CHOLHDL 2.5 05/12/2015   Lab Results  Component Value Date   ALT 19 12/04/2015   AST 22 12/04/2015   ALKPHOS 95 12/04/2015   BILITOT 0.8 12/04/2015      Radiology:   CXR: Cardiac shadow is stable. Bilateral breast implants are noted. A pacemaker is again seen and stable. Postsurgical changes are again noted. No focal infiltrate or sizable effusion is seen. No bony abnormality is noted  EKG:  Atrial paced, rate 77, prolonged QT.    ASSESSMENT AND PLAN:   SYNCOPE:   I suspect that this is related to the NTG she took in the car.  She is currently hypertensive.  We can check orthostatic BPs again today.  HCTZ is not on her med list now and I would keep this off.  I would increase the lisinopril to 30 mg.  I suspect that she will continue to have problems with BP control and med side effects.    PAF:  She has tachybrady and a pacemaker clip.  She also had a left atrial appendage clip at the time of CABG.  She is on Xarelto.  She has a low burden of this on device interrogation.  No change in therapy is indicated.   She is up to date with device follow up. She seems to tolerate a low dose of amiodarone.  No change in therapy is indicated.    CAD/CABG:  She had a negative stress test at [facility] in Sept of this year.    No evidence of ischemia.  No further testing.   HTN:  This has been difficult to control.    See above.   CHRONIC DIASTOLIC HF:  Check orthostatics.  She otherwise seems to be euvolemic.   SignedMinus Breeding 12/07/2015, 9:00 AM

## 2015-12-07 NOTE — Progress Notes (Signed)
Pt son Gene Banwart called and has concerns about his mothers blood pressure not getting checked every hour since she's come in with syncope. I tried to explain to him that we are on a telemetry floor where are nurse:patient and nurse tech:patient ratio is more than an ICU. We don't have the automatic equipment like theirs to be taking vital signs every single hour. Her last blood pressure was in the 140's over 80's. He wanted to talk to to the doctor on-call since he couldn't talk to her rounding doctor at this moment. The doctor on-call put in orders to check blood pressure q1hour for the remainder of the night. The MD that is her attending or someone involved in her care will need either keep those orders or come up with something new. Her son's number is [phone], he wants to talk to a doctor about why his mother's blood pressure hasn't been getting checked every single hour. Son was very upset.  Carmela Hurt, RN 10:00 PM 12/07/15

## 2015-12-07 NOTE — Evaluation (Signed)
Physical Therapy Evaluation Patient Details Name: [name] MRN: [id number] DOB: [birth year] Today's Date: 12/07/2015   History of Present Illness  Pt with recent difficulties with syncope and recent LOC after pulling to side of road in car. (see H&P for details) . pt s/p CABG x3 about 10 months ago. patient felt she was regaining her strength , however limited with this sycope, dizzinees/ "about to pass out" feeling, which has increased lately. has a pacemaker and has had cardioversions in the past as well.   Clinical Impression  Pt with reports of syncope and LOC, during assessment was positive for drop in BPs orthostaticlly and symptomatically. Pt denies "spinning", however reports that even with little movement from reclined LES to sitting EOB brings on symptoms and symptoms increaed greatly with standing with drop in BPS as well. Pt reports when this happens pressure in her head, some ringing or buzz in her ears, and overall feeling of "wiped out" not strong during her symptoms. These subside when she sits and elevates her feet.  Continue PT while she is here in acute care.      Follow Up Recommendations No PT follow up    Equipment Recommendations  None recommended by PT (not at this time)    Recommendations for Other Services       Precautions / Restrictions Precautions Precautions: Fall Precaution Comments: due to postiive for drop in BPS orthostatically  Restrictions Weight Bearing Restrictions: No      Mobility  Bed Mobility                  Transfers Overall transfer level: Needs assistance Equipment used: None Transfers: Sit to/from Stand Sit to Stand: Min guard         General transfer comment: guard due to pateint reporting symptoms of "feeling funny" pressure in her head and not feeling strong.. lightheaadness.   Ambulation/Gait Ambulation/Gait assistance: Min assist;Min guard (just ambulated and marched in place due to drop in BP, assesing BP so  stayed close to recliner. ) Ambulation Distance (Feet):  (feel pateint could have pysically been capable, howeer was getting weak feeling due to drop of BP and funny feeling in head like pressure and "ringing" she stated.)            Stairs            Wheelchair Mobility    Modified Rankin (Stroke Patients Only)       Balance                                             Pertinent Vitals/Pain Pain Assessment: No/denies pain    Home Living Family/patient expects to be discharged to:: Private residence Living Arrangements: Alone Available Help at Discharge: Family (grandaught in school and staying with her but not home often , daughter checks on her frequently) Type of Home: House Home Access: Stairs to enter Entrance Stairs-Rails: Right Entrance Stairs-Number of Steps: 5 Home Layout: Able to live on main level with bedroom/bathroom Home Equipment: None      Prior Function Level of Independence: Independent         Comments: Pt works out at [employer], but has not been able to do this lately.      Hand Dominance        Extremity/Trunk Assessment  Lower Extremity Assessment: Generalized weakness         Communication   Communication: No difficulties  Cognition Arousal/Alertness: Awake/alert Behavior During Therapy: WFL for tasks assessed/performed Overall Cognitive Status: Within Functional Limits for tasks assessed                      General Comments      Exercises     Assessment/Plan    PT Assessment Patient needs continued PT services  PT Problem List Decreased strength;Decreased activity tolerance;Decreased mobility          PT Treatment Interventions Gait training;Functional mobility training;Therapeutic exercise;Therapeutic activities;Patient/family education    PT Goals (Current goals can be found in the Care Plan section)  Acute Rehab PT Goals Patient Stated Goal: I want to  get back to doing normal things and going to the gym. I don't like for things to slow me down.  PT Goal Formulation: With patient Time For Goal Achievement: 12/21/15 Potential to Achieve Goals: Good    Frequency Min 3X/week   Barriers to discharge        Co-evaluation               End of Session   Activity Tolerance: Patient tolerated treatment well;Treatment limited secondary to medical complications (Comment) Patient left: in chair;with call bell/phone within reach;with chair alarm set Nurse Communication: Mobility status    Functional Assessment Tool Used: clincial judgement Functional Limitation: Mobility: Walking and moving around Mobility: Walking and Moving Around Current Status [id number]): At least 1 percent but less than 20 percent impaired, limited or restricted Mobility: Walking and Moving Around Goal Status [phone]): 0 percent impaired, limited or restricted    Time: 1150-1212 PT Time Calculation (min) (ACUTE ONLY): 22 min   Charges:   PT Evaluation $PT Eval Low Complexity: 1 Procedure PT Treatments $Therapeutic Activity: 8-22 mins   PT G Codes:   PT G-Codes **NOT FOR INPATIENT CLASS** Functional Assessment Tool Used: clincial judgement Functional Limitation: Mobility: Walking and moving around Mobility: Walking and Moving Around Current Status [id number]): At least 1 percent but less than 20 percent impaired, limited or restricted Mobility: Walking and Moving Around Goal Status [phone]): 0 percent impaired, limited or restricted    Sherrel Shafer, [facility] 12/07/2015, 12:34 PM  Clide Dales, PT Pager: [phone] 12/07/2015

## 2015-12-07 NOTE — Progress Notes (Signed)
Orthostatic VS completed with BP drop from 135/61 lying to 90/58 standing. Patient was symptomatic also- c/o dizziness and weakness upon sitting and standing. MD Hospitalist paged to be made aware. Patient steady when walking to BR unassisted with staff in room. Currently sitting up in chair with chair alarm on.

## 2015-12-07 NOTE — Care Management Obs Status (Signed)
[place] NOTIFICATION   Patient Details  Name: [name] MRN: [id number] Date of Birth: 04-28-1940   Medicare Observation Status Notification Given:  Yes    Erenest Rasher, RN 12/07/2015, 5:22 PM

## 2015-12-08 DIAGNOSIS — R55 Syncope and collapse: Secondary | ICD-10-CM | POA: Diagnosis not present

## 2015-12-08 LAB — BASIC METABOLIC PANEL
Anion gap: 9 (ref 5–15)
BUN: 13 mg/dL (ref 6–20)
CO2: 25 mmol/L (ref 22–32)
Calcium: 10.8 mg/dL — ABNORMAL HIGH (ref 8.9–10.3)
Chloride: 104 mmol/L (ref 101–111)
Creatinine, Ser: 0.85 mg/dL (ref 0.44–1.00)
GFR calc Af Amer: >60 mL/min (ref >60)
GFR calc non Af Amer: >60 mL/min (ref >60)
Glucose, Bld: 101 mg/dL — ABNORMAL HIGH (ref 65–99)
Potassium: 4.6 mmol/L (ref 3.5–5.1)
Sodium: 138 mmol/L (ref 135–145)

## 2015-12-08 LAB — GLUCOSE, CAPILLARY: Glucose-Capillary: 135 mg/dL — ABNORMAL HIGH (ref 65–99)

## 2015-12-08 MED ORDER — LORATADINE 10 MG PO TABS
10.0000 mg | ORAL_TABLET | Freq: Every day | ORAL | Status: DC
  Administered 2015-12-08 – 2015-12-09 (×2): 10 mg via ORAL
  Filled 2015-12-08 (×2): qty 1

## 2015-12-08 MED ORDER — MENTHOL 3 MG MT LOZG
1.0000 | LOZENGE | OROMUCOSAL | Status: DC | PRN
  Filled 2015-12-08: qty 9

## 2015-12-08 NOTE — Progress Notes (Signed)
Patient Name: [name] Date of Encounter: 12/08/2015  Hospital Problem List     Principal Problem:   Syncope Active Problems:   PAF (paroxysmal atrial fibrillation) (HCC)   CAD (coronary artery disease)   Chronic diastolic heart failure (HCC)   Hypotension    Patient Profile     Patient with difficult to control HTN.  Admitted with syncope.   Subjective   Mildly dizzy and did not ambulate because of this.    Inpatient Medications    . amiodarone  100 mg Oral Daily  . carvedilol  6.25 mg Oral BID WC  . cholecalciferol  1,000 Units Oral Daily  . citalopram  10 mg Oral Daily  . clopidogrel  75 mg Oral Daily  . lisinopril  30 mg Oral Daily  . multivitamin with minerals  1 tablet Oral Daily  . Rivaroxaban  15 mg Oral Q supper  . sodium chloride flush  3 mL Intravenous Q12H    Vital Signs    Vitals:   12/08/15 0316 12/08/15 0536 12/08/15 0648 12/08/15 0809  BP: (!) 160/98 (!) 170/86 (!) 171/97 132/79  Pulse: 75 75 70 77  Resp:  16    Temp:  97.6 F (36.4 C)    TempSrc:  Oral    SpO2:  94%    Weight:      Height:        Intake/Output Summary (Last 24 hours) at 12/08/15 0918 Last data filed at 12/07/15 1900  Gross per 24 hour  Intake              720 ml  Output                0 ml  Net              720 ml   Filed Weights   12/05/15 1601 12/05/15 2100 12/06/15 0500  Weight: 141 lb 3.2 oz (64 kg) 137 lb 5.6 oz (62.3 kg) 112 lb 10.5 oz (51.1 kg)    Physical Exam    GEN: Well nourished, well developed, in  no acute distress.  Neck: Supple, no JVD, carotid bruits, or masses. Cardiac: RRR, no rubs, or gallops. No clubbing, cyanosis, no edema.  Radials/DP/PT 2+ and equal bilaterally.  Respiratory:  Respirations  regular and unlabored, clear to auscultation bilaterally. GI: Soft, nontender, nondistended, BS + x 4. Neuro:  Strength and sensation are intact.   Labs    CBC  Recent Labs  12/05/15 1606 12/07/15 0517  WBC 5.5 4.7  HGB 12.4 12.3    HCT 38.6 37.8  MCV 84.1 84.4  PLT 257 [id number]   Basic Metabolic Panel  Recent Labs  12/07/15 0517 12/08/15 0544  NA 138 138  K 4.2 4.6  CL 106 104  CO2 25 25  GLUCOSE 109* 101*  BUN 15 13  CREATININE 0.80 0.85  CALCIUM 10.5* 10.8*   Liver Function Tests No results for input(s): AST, ALT, ALKPHOS, BILITOT, PROT, ALBUMIN in the last 72 hours. No results for input(s): LIPASE, AMYLASE in the last 72 hours. Cardiac Enzymes  Recent Labs  12/05/15 2051 12/06/15 0404  TROPONINI <0.03 <0.03   BNP Invalid input(s): POCBNP D-Dimer No results for input(s): DDIMER in the last 72 hours. Hemoglobin A1C No results for input(s): HGBA1C in the last 72 hours. Fasting Lipid Panel No results for input(s): CHOL, HDL, LDLCALC, TRIG, CHOLHDL, LDLDIRECT in the last 72 hours. Thyroid Function Tests No results for input(s): TSH, T4TOTAL,  T3FREE, THYROIDAB in the last 72 hours.  Invalid input(s): FREET3  Telemetry    Atrial paced and AV paced.   ECG    NA  Radiology    Dg Chest 2 View  Result Date: 12/02/2015 CLINICAL DATA:  Chest pain, shortness of breath EXAM: CHEST  2 VIEW COMPARISON:  CT chest 08/26/2015 FINDINGS: There is no focal parenchymal opacity. There is no pleural effusion or pneumothorax. There is mild stable cardiomegaly. There is a dual lead AICD. There is evidence of prior CABG. The osseous structures are unremarkable. IMPRESSION: No active cardiopulmonary disease. Electronically Signed   By: Kathreen Devoid   On: 12/02/2015 16:00   Ct Head Wo Contrast  Result Date: 12/02/2015 CLINICAL DATA:  Initial evaluation for acute headache. EXAM: CT HEAD WITHOUT CONTRAST TECHNIQUE: Contiguous axial images were obtained from the base of the skull through the vertex without intravenous contrast. COMPARISON:  None. FINDINGS: Brain: Generalized cerebral atrophy with chronic microvascular ischemic disease. No acute intracranial hemorrhage. No evidence for acute large vessel territory  infarct. No mass lesion, midline shift or mass effect. No hydrocephalus. No extra-axial fluid collection. Vascular: No hyperdense vessel. Scattered vascular calcifications noted. Skull: Scalp soft tissues within normal limits.  Calvarium intact. Sinuses/Orbits: Globes and orbital soft tissues within normal limits. Patient is status post lens extraction on the left. Paranasal sinuses and mastoid air cells are clear. IMPRESSION: 1. No acute intracranial process. 2. Generalized age-related cerebral atrophy with moderate chronic microvascular ischemic disease. Electronically Signed   By: Jeannine Boga M.D.   On: 12/02/2015 20:50   Dg Chest Port 1 View  Result Date: 12/05/2015 CLINICAL DATA:  Chest pain and shortness of Breath EXAM: PORTABLE CHEST 1 VIEW COMPARISON:  12/02/2015 FINDINGS: Cardiac shadow is stable. Bilateral breast implants are noted. A pacemaker is again seen and stable. Postsurgical changes are again noted. No focal infiltrate or sizable effusion is seen. No bony abnormality is noted. IMPRESSION: No acute abnormality seen. No significant change from the prior exam. Electronically Signed   By: Inez Catalina M.D.   On: 12/05/2015 16:58    Assessment & Plan    SYNCOPE:  Enzymes negative.   Mild bilateral carotid plaque.  No clear etiiology for the syncope.  I suspect that this was related to BP meds and the NTG that she took.    HTN:  BP has been fluctuating.  However currently well controlled with increased ACE dose.  Continue current therapy.      Signed, Minus Breeding, MD  12/08/2015, 9:18 AM

## 2015-12-08 NOTE — Progress Notes (Signed)
Patient ID: [name], female   DOB: 07/26/1940, 75 y.o.   MRN: [id number]    PROGRESS NOTE  KERON ERKKILA  [id number] DOB: 1941/01/17 DOA: 12/05/2015  PCP: [name], MD   Brief Narrative:  75 y.o. female with medical history significant for hypertension, coronary artery disease status post CABG, chronic diastolic CHF, and paroxysmal atrial fibrillation who presents the emergency department following a syncopal episode. Patient was just discharged from the hospital yesterday after she was managed for hyperkalemia and hypertensive urgency. Blood pressures were very difficult to control during the admission and the patient has allergy and intolerance to multiple agents. Cardiology consultation was obtained during the recent hospitalization. She was discharged yesterday after Toprol was switched to Coreg and HCTZ was added. She reports feeling well at time of discharge and went to bed last night in her usual state of health. She reports waking this morning with some lightheadedness and mild malaise. These symptoms progressed throughout the course in the morning and patient went to the pharmacy to pick up her new prescriptions of Coreg and HCTZ. She took her first dose of these medications at the pharmacy and was then driving back home when she had severe worsening in her lightheadedness and malaise, feeling as though she may lose consciousness. She was driving past [facility] and pulled into the parking lot. She called her daughter and her physician from the parking lot after she had briefly lost consciousness, and was directed to the ED.   Assessment & Plan:  1. Syncope  - Suspected secondary to hypotension, likely influenced by her antihypertensive medications  - orthostatics +, pt however ambulated with no problems with PT - ECHO with stable EF and grade I diastolic CHF - PT eval done, no further recommendations   2. Hypertension, essential  - Pt recently admitted with very  difficult to manage hypertensive urgency; cardiology was consulted and pt was discharged with Coreg replacing Toprol, and with the addition of HCTZ - She was hypotensive in ED after a syncopal episode  - continue with Coreg lower dose, increased the dose of Lisinopril to 30 mg PO QD - BP and HR currently stable, please note that there is no need to have BP checked every hour - if family continues to request Q1 hours BP checks, please notify me so we can discuss  - continue to hold HCTZ  3. Atrial fibrillation, paroxysmal  - CHADS-VASc at least 18 (age x2, gender, CHF, HTN, CAD)  - Continue AC with Xarelto; continue amiodarone  - Monitoring on telemetry   - continue on lower dose Coreg  4. Chronic diastolic CHF - no signs of volume overload on exam this AM - TTE (12/04/15) with EF 0000000, grade 1 diastolic dysfunction, moderate AI - Managed with lisinopril and Coreg  5. CAD - pt is s/p CABG in November 2016, 2 stents placed in April 2017; stress test September 2017 neg for ischemia  - No anginal complaints on admission - EKG not significantly changed from prior and troponin is undetectable   DVT prophylaxis: Xarelto  Code Status: Partial, no intubation  Family Communication: pt at bedside  Disposition Plan: Home in AM  Consultants:   Cardiology   Procedures:   None  Antimicrobials:   None  Subjective: No events overnight.   Objective: Vitals:   12/08/15 0316 12/08/15 0536 12/08/15 0648 12/08/15 0809  BP: (!) 160/98 (!) 170/86 (!) 171/97 132/79  Pulse: 75 75 70 77  Resp:  16  Temp:  97.6 F (36.4 C)    TempSrc:  Oral    SpO2:  94%    Weight:      Height:        Intake/Output Summary (Last 24 hours) at 12/08/15 1229 Last data filed at 12/08/15 0900  Gross per 24 hour  Intake              480 ml  Output                0 ml  Net              480 ml   Filed Weights   12/05/15 1601 12/05/15 2100 12/06/15 0500  Weight: 64 kg (141 lb 3.2 oz) 62.3 kg (137  lb 5.6 oz) 51.1 kg (112 lb 10.5 oz)    Examination:  General exam: Appears calm and comfortable  Respiratory system: Clear to auscultation. Respiratory effort normal. Cardiovascular system: S1 & S2 heard, RRR. No JVD, rubs, gallops or clicks. No pedal edema. SEM 2/6. Gastrointestinal system: Abdomen is nondistended, soft and nontender. No organomegaly or masses felt.  Central nervous system: Alert and oriented. No focal neurological deficits.  Data Reviewed: I have personally reviewed following labs and imaging studies  CBC:  Recent Labs Lab 12/02/15 1523 12/04/15 0321 12/05/15 1606 12/07/15 0517  WBC 4.3 5.3 5.5 4.7  HGB 12.7 12.0 12.4 12.3  HCT 39.0 36.5 38.6 37.8  MCV 84.2 83.1 84.1 84.4  PLT 248 222 257 [id number]   Basic Metabolic Panel:  Recent Labs Lab 12/04/15 0321 12/05/15 1606 12/06/15 0404 12/07/15 0517 12/08/15 0544  NA 139 137 137 138 138  K 4.4 3.7 3.5 4.2 4.6  CL 102 102 106 106 104  CO2 29 26 25 25 25   GLUCOSE 121* 145* 111* 109* 101*  BUN 12 19 16 15 13   CREATININE 0.92 1.16* 0.83 0.80 0.85  CALCIUM 10.3 10.9* 10.0 10.5* 10.8*   Liver Function Tests:  Recent Labs Lab 12/04/15 0321  AST 22  ALT 19  ALKPHOS 95  BILITOT 0.8  PROT 5.9*  ALBUMIN 3.4*   Coagulation Profile:  Recent Labs Lab 12/05/15 1606  INR 1.41   Cardiac Enzymes:  Recent Labs Lab 12/03/15 0154 12/03/15 0412 12/03/15 0717 12/05/15 2051 12/06/15 0404  TROPONINI <0.03 <0.03 <0.03 <0.03 <0.03   CBG:  Recent Labs Lab 12/05/15 1615 12/06/15 0526 12/06/15 0739 12/07/15 0752 12/08/15 0755  GLUCAP 158* 108* 117* 160* 135*   Urine analysis:    Component Value Date/Time   COLORURINE YELLOW 12/05/2015 1600   APPEARANCEUR CLEAR 12/05/2015 1600   LABSPEC 1.013 12/05/2015 1600   PHURINE 6.5 12/05/2015 1600   GLUCOSEU NEGATIVE 12/05/2015 1600   GLUCOSEU NEGATIVE 12/31/2009 1612   HGBUR NEGATIVE 12/05/2015 1600   BILIRUBINUR NEGATIVE 12/05/2015 1600   KETONESUR  NEGATIVE 12/05/2015 1600   PROTEINUR NEGATIVE 12/05/2015 1600   UROBILINOGEN 1.0 06/03/2012 1610   NITRITE NEGATIVE 12/05/2015 1600   LEUKOCYTESUR NEGATIVE 12/05/2015 1600   Radiology Studies: No results found. Scheduled Meds: . amiodarone  100 mg Oral Daily  . carvedilol  6.25 mg Oral BID WC  . cholecalciferol  1,000 Units Oral Daily  . citalopram  10 mg Oral Daily  . clopidogrel  75 mg Oral Daily  . lisinopril  30 mg Oral Daily  . multivitamin with minerals  1 tablet Oral Daily  . Rivaroxaban  15 mg Oral Q supper  . sodium chloride flush  3 mL Intravenous Q12H  Continuous Infusions:    LOS: 0 days    Time spent: 20 minutes    Faye Ramsay, MD [facility] Pager [phone]  If 7PM-7AM, please contact night-coverage [email or web address] Password [email or web address] 12/08/2015, 12:29 PM

## 2015-12-09 ENCOUNTER — Other Ambulatory Visit: Payer: Self-pay | Admitting: Physician Assistant

## 2015-12-09 ENCOUNTER — Telehealth: Payer: Self-pay | Admitting: Cardiovascular Disease

## 2015-12-09 DIAGNOSIS — I10 Essential (primary) hypertension: Secondary | ICD-10-CM

## 2015-12-09 DIAGNOSIS — R55 Syncope and collapse: Secondary | ICD-10-CM | POA: Diagnosis not present

## 2015-12-09 DIAGNOSIS — I959 Hypotension, unspecified: Secondary | ICD-10-CM

## 2015-12-09 LAB — GLUCOSE, CAPILLARY: Glucose-Capillary: 100 mg/dL — ABNORMAL HIGH (ref 65–99)

## 2015-12-09 MED ORDER — LISINOPRIL 20 MG PO TABS
30.0000 mg | ORAL_TABLET | Freq: Every day | ORAL | Status: DC
  Administered 2015-12-09: 30 mg via ORAL
  Filled 2015-12-09: qty 1

## 2015-12-09 MED ORDER — LISINOPRIL 30 MG PO TABS: 30.0000 mg | ORAL_TABLET | Freq: Every day | ORAL | 0 refills | Status: DC

## 2015-12-09 MED ORDER — CITALOPRAM HYDROBROMIDE 20 MG PO TABS: 10.0000 mg | ORAL_TABLET | Freq: Every day | ORAL | Status: DC

## 2015-12-09 MED ORDER — LISINOPRIL 20 MG PO TABS: 40.0000 mg | ORAL_TABLET | Freq: Every day | ORAL | Status: DC

## 2015-12-09 MED ORDER — CARVEDILOL 6.25 MG PO TABS: 6.2500 mg | ORAL_TABLET | Freq: Two times a day (BID) | ORAL | 0 refills | Status: DC

## 2015-12-09 NOTE — Progress Notes (Signed)
Spoke with pt before discharge concerning financial assistance. Explained that pt could call the financial office, that is something CM do do is financial assistance for billing. Pt states " I know , I will call."  Pt has Medicare for insurance, asked pt if she need assist with her medications, started to explain [employer] and online corp where medication were made, made states, I have already checked on that and it is no good."  There no other programs at present time to assist someone with medication coverage.. Pt states, "I no, I can purchase my medications, but I want all the help I can get."

## 2015-12-09 NOTE — Discharge Instructions (Signed)

## 2015-12-09 NOTE — Discharge Summary (Signed)
Physician Discharge Summary  [name] [id number] DOB: 27-Apr-1940 DOA: 12/05/2015  PCP: [name], MD  Admit date: 12/05/2015 Discharge date: 12/09/2015  Recommendations for Outpatient Follow-up:  1. Appt is on 12/17/15 at 10am w/ [name] at the [facility] office, cardiology team, please check BMP to make sure Cr and K is WNL  2. Please note that HCTZ was discontinued 3. Pt currently on Lisinopril 30 mg PO QD 4. Please also note that dose of Coreg was changed from 25 mg BID to 6.25 mg PO BID  5. Changes discussed with pt in detail   Discharge Diagnoses:  Principal Problem:   Syncope Active Problems:   PAF (paroxysmal atrial fibrillation) (HCC)   CAD (coronary artery disease)   Chronic diastolic heart failure (HCC)   Hypotension  Discharge Condition: Stable  Diet recommendation: Heart healthy diet discussed in details   Brief Narrative:  75 y.o.femalewith medical history significant forhypertension, coronary artery disease status post CABG, chronic diastolic CHF, and paroxysmal atrial fibrillation who presents the emergency department following a syncopal episode. Patient was just discharged from the hospital yesterday after she was managed for hyperkalemia and hypertensive urgency. Blood pressures were very difficult to control during the admission and the patient has allergy and intolerance to multiple agents. Cardiology consultation was obtained during the recent hospitalization. She was discharged yesterday after Toprol was switched to Coreg and HCTZ was added. She reports feeling well at time of discharge and went to bed last night in her usual state of health. She reports waking this morning with some lightheadedness and mild malaise. These symptoms progressed throughout the course in the morning and patient went to the pharmacy to pick up her new prescriptions of Coreg and HCTZ. She took her first dose of these medications at the pharmacy and was then  driving back home when she had severe worsening in her lightheadedness and malaise, feeling as though she may lose consciousness. She was driving past [facility] and pulled into the parking lot. She called her daughter and her physician from the parking lot after she had briefly lost consciousness, and was directed to the ED.   Assessment & Plan:  1. Syncope  - Suspected secondary to hypotension, likely influenced by her antihypertensive medications  - orthostatics +, pt however ambulated with no problems with PT - ECHO with stable EF and grade I diastolic CHF - PT eval done, no further recommendations   2. Hypertension, essential  - Pt recently admitted with very difficult to manage hypertensive urgency; cardiology was consulted and pt was discharged with Coreg replacing Toprol, and with the addition of HCTZ - She was hypotensive in ED after a syncopal episode  - continue with Coreg lower dose, increased the dose of Lisinopril to 30 mg PO QD  3. Atrial fibrillation, paroxysmal  - CHADS-VASc at least 49 (age x2, gender, CHF, HTN, CAD)  - Continue AC with Xarelto; continue amiodarone   4. Chronic diastolic CHF - no signs of volume overload on exam this AM - TTE (12/04/15) with EF 0000000, grade 1 diastolic dysfunction, moderate AI - Managed with lisinopril and Coreg  5. CAD - pt is s/p CABG in November 2016, 2 stents placed in April 2017; stress test September 2017 neg for ischemia  - No anginal complaints on admission - EKG not significantly changed from prior and troponin is undetectable   DVT prophylaxis:Xarelto  Code Status:Partial, no intubation  Family Communication: pt at bedside  Disposition Plan: Home  Consultants:   Cardiology   Procedures:   None  Antimicrobials:   None  Procedures/Studies: Dg Chest 2 View  Result Date: 12/02/2015 CLINICAL DATA:  Chest pain, shortness of breath EXAM: CHEST  2 VIEW COMPARISON:  CT chest 08/26/2015  FINDINGS: There is no focal parenchymal opacity. There is no pleural effusion or pneumothorax. There is mild stable cardiomegaly. There is a dual lead AICD. There is evidence of prior CABG. The osseous structures are unremarkable. IMPRESSION: No active cardiopulmonary disease. Electronically Signed   By: Kathreen Devoid   On: 12/02/2015 16:00   Ct Head Wo Contrast  Result Date: 12/02/2015 CLINICAL DATA:  Initial evaluation for acute headache. EXAM: CT HEAD WITHOUT CONTRAST TECHNIQUE: Contiguous axial images were obtained from the base of the skull through the vertex without intravenous contrast. COMPARISON:  None. FINDINGS: Brain: Generalized cerebral atrophy with chronic microvascular ischemic disease. No acute intracranial hemorrhage. No evidence for acute large vessel territory infarct. No mass lesion, midline shift or mass effect. No hydrocephalus. No extra-axial fluid collection. Vascular: No hyperdense vessel. Scattered vascular calcifications noted. Skull: Scalp soft tissues within normal limits.  Calvarium intact. Sinuses/Orbits: Globes and orbital soft tissues within normal limits. Patient is status post lens extraction on the left. Paranasal sinuses and mastoid air cells are clear. IMPRESSION: 1. No acute intracranial process. 2. Generalized age-related cerebral atrophy with moderate chronic microvascular ischemic disease. Electronically Signed   By: Jeannine Boga M.D.   On: 12/02/2015 20:50   Dg Chest Port 1 View  Result Date: 12/05/2015 CLINICAL DATA:  Chest pain and shortness of Breath EXAM: PORTABLE CHEST 1 VIEW COMPARISON:  12/02/2015 FINDINGS: Cardiac shadow is stable. Bilateral breast implants are noted. A pacemaker is again seen and stable. Postsurgical changes are again noted. No focal infiltrate or sizable effusion is seen. No bony abnormality is noted. IMPRESSION: No acute abnormality seen. No significant change from the prior exam. Electronically Signed   By: Inez Catalina M.D.    On: 12/05/2015 16:58    Discharge Exam: Vitals:   12/09/15 0643 12/09/15 1025  BP: (!) 147/79 126/74  Pulse: 79 77  Resp: 18   Temp: 98.4 F (36.9 C)    Vitals:   12/08/15 2207 12/09/15 0200 12/09/15 0643 12/09/15 1025  BP: (!) 178/90 (!) 175/95 (!) 147/79 126/74  Pulse: 77  79 77  Resp: 19  18   Temp: 98.5 F (36.9 C)  98.4 F (36.9 C)   TempSrc: Oral  Oral   SpO2: 99%  98%   Weight:      Height:        General: Pt is alert, follows commands appropriately, not in acute distress Cardiovascular: Regular rate and rhythm, S1/S2 +, no murmurs, no rubs, no gallops Respiratory: Clear to auscultation bilaterally, no wheezing, no crackles, no rhonchi Abdominal: Soft, non tender, non distended, bowel sounds +, no guarding Extremities: no edema, no cyanosis, pulses palpable bilaterally DP and PT  Discharge Instructions  Discharge Instructions    Diet - low sodium heart healthy    Complete by:  As directed    Increase activity slowly    Complete by:  As directed        Medication List    STOP taking these medications   hydrochlorothiazide 25 MG tablet Commonly known as:  HYDRODIURIL     TAKE these medications   amiodarone 100 MG tablet Commonly known as:  PACERONE Take 1 tablet (100 mg total) by mouth daily.   carvedilol 6.25  MG tablet Commonly known as:  COREG Take 1 tablet (6.25 mg total) by mouth 2 (two) times daily with a meal. What changed:  medication strength  how much to take   cholecalciferol 1000 units tablet Commonly known as:  VITAMIN D Take 1,000 Units by mouth daily.   citalopram 20 MG tablet Commonly known as:  CELEXA Take 0.5 tablets (10 mg total) by mouth daily.   clopidogrel 75 MG tablet Commonly known as:  PLAVIX TAKE 1 TABLET (75 MG TOTAL) BY MOUTH DAILY.   COQ10 PO Take 120 mg by mouth daily.   lisinopril 30 MG tablet Commonly known as:  PRINIVIL,ZESTRIL Take 1 tablet (30 mg total) by mouth daily. What changed:  medication  strength  how much to take   multivitamin capsule Take 1 capsule by mouth daily.   nitroGLYCERIN 0.4 MG SL tablet Commonly known as:  NITROSTAT Place 1 tablet (0.4 mg total) under the tongue every 5 (five) minutes x 3 doses as needed for chest pain.   Rivaroxaban 15 MG Tabs tablet Commonly known as:  XARELTO Take 1 tablet (15 mg total) by mouth daily with supper.      Follow-up Information    [facility] Follow up on 12/13/2015.   Specialty:  Cardiology Why:  Please go to the office on Friday from 8am-4pm for labwork.  Contact information: [address] [place] [employer] [phone]       [name], PA-C Follow up on 12/17/2015.   Specialties:  Cardiology, Radiology Why:  @ 10:00 am (Dr. Victorino December PA) Contact information: [address] [place] [address] [phone]        [name], MD .   Specialty:  Family Medicine Contact information: [place] [address] [phone]        Faye Ramsay, MD .   Specialty:  Internal Medicine Why:  call my cell phone with questions [phone] Contact information: [address] [place] [address] [phone]            The results of significant diagnostics from this hospitalization (including imaging, microbiology, ancillary and laboratory) are listed below for reference.     Microbiology: No results found for this or any previous visit (from the past 240 hour(s)).   Labs: Basic Metabolic Panel:  Recent Labs Lab 12/04/15 0321 12/05/15 1606 12/06/15 0404 12/07/15 0517 12/08/15 0544  NA 139 137 137 138 138  K 4.4 3.7 3.5 4.2 4.6  CL 102 102 106 106 104  CO2 29 26 25 25 25   GLUCOSE 121* 145* 111* 109* 101*  BUN 12 19 16 15 13   CREATININE 0.92 1.16* 0.83 0.80 0.85  CALCIUM 10.3 10.9* 10.0 10.5* 10.8*   Liver Function Tests:  Recent Labs Lab 12/04/15 0321  AST 22  ALT 19   ALKPHOS 95  BILITOT 0.8  PROT 5.9*  ALBUMIN 3.4*   No results for input(s): LIPASE, AMYLASE in the last 168 hours. No results for input(s): AMMONIA in the last 168 hours. CBC:  Recent Labs Lab 12/02/15 1523 12/04/15 0321 12/05/15 1606 12/07/15 0517  WBC 4.3 5.3 5.5 4.7  HGB 12.7 12.0 12.4 12.3  HCT 39.0 36.5 38.6 37.8  MCV 84.2 83.1 84.1 84.4  PLT 248 222 257 241   Cardiac Enzymes:  Recent Labs Lab 12/03/15 0154 12/03/15 0412 12/03/15 0717 12/05/15 2051 12/06/15 0404  TROPONINI <0.03 <0.03 <0.03 <0.03 <0.03   BNP: BNP (last 3  results)  Recent Labs  06/26/15 1105 08/26/15 0330 12/02/15 1917  BNP 427.7* 647.2* 499.0*    ProBNP (last 3 results) No results for input(s): PROBNP in the last 8760 hours.  CBG:  Recent Labs Lab 12/06/15 0526 12/06/15 0739 12/07/15 0752 12/08/15 0755 12/09/15 0755  GLUCAP 108* 117* 160* 135* 100*   SIGNED: Time coordinating discharge: 30 minutes  MAGICK-Candra Wegner, MD  [facility] 12/09/2015, 11:33 AM Pager [phone]  If 7PM-7AM, please contact night-coverage [email or web address] Password [email or web address]

## 2015-12-09 NOTE — Progress Notes (Signed)
Physical Therapy Treatment and Discharge from Acute PT Patient Details Name: [name] MRN: [id number] DOB: Jun 30, 1940 Today's Date: 12/09/2015    History of Present Illness Pt with recent difficulties with syncope and recent LOC after pulling to side of road in car. (see H&P for details) . pt s/p CABG x3 about 10 months ago. patient felt she was regaining her strength , however limited with this sycope, dizzinees/ "about to pass out" feeling, which has increased lately. has a pacemaker and has had cardioversions in the past as well.     PT Comments    Pt mobilizing very well and tolerated increased gait distance today.  Pt denies any symptoms with mobility.  Discussed safety with mobility (waiting for dizziness to resolve prior to standing and then walking) if dizziness returns.  Pt reports understanding and feels ready for d/c home today.   Follow Up Recommendations  No PT follow up     Equipment Recommendations  None recommended by PT    Recommendations for Other Services       Precautions / Restrictions Precautions Precautions: Fall Restrictions Weight Bearing Restrictions: No    Mobility  Bed Mobility Overal bed mobility: Modified Independent                Transfers Overall transfer level: Needs assistance Equipment used: None Transfers: Sit to/from Stand Sit to Stand: Supervision            Ambulation/Gait Ambulation/Gait assistance: Supervision Ambulation Distance (Feet): 800 Feet Assistive device: None Gait Pattern/deviations: WFL(Within Functional Limits)     General Gait Details: pt without symptoms, tolerated distance well   Stairs            Wheelchair Mobility    Modified Rankin (Stroke Patients Only)       Balance                                    Cognition Arousal/Alertness: Awake/alert Behavior During Therapy: WFL for tasks assessed/performed Overall Cognitive Status: Within Functional Limits for  tasks assessed                      Exercises      General Comments        Pertinent Vitals/Pain Pain Assessment: No/denies pain    Home Living                      Prior Function            PT Goals (current goals can now be found in the care plan section) Progress towards PT goals: Goals met/education completed, patient discharged from PT    Frequency           PT Plan Other (comment) (d/c from acute PT)    Co-evaluation             End of Session Equipment Utilized During Treatment: Gait belt Activity Tolerance: Patient tolerated treatment well Patient left: in chair;with call bell/phone within reach     Time: 1028-1039 PT Time Calculation (min) (ACUTE ONLY): 11 min  Charges:  $Gait Training: 8-22 mins                    G Codes:  Functional Assessment Tool Used: clincial judgement Functional Limitation: Mobility: Walking and moving around Mobility: Walking and Moving Around Current Status (C1660): 0 percent impaired, limited or  restricted Mobility: Walking and Moving Around Goal Status [phone]): 0 percent impaired, limited or restricted Mobility: Walking and Moving Around Discharge Status [phone]): 0 percent impaired, limited or restricted   [place] Masaki,KATHrine E 12/09/2015, 1:00 PM Carmelia Bake, PT, DPT 12/09/2015 Pager: [phone]

## 2015-12-09 NOTE — Telephone Encounter (Signed)
TCM Phone call Appt is on 12/17/15 at 10am w/ [name] at the [facility] office

## 2015-12-09 NOTE — Progress Notes (Signed)
Patient Name: [name] Date of Encounter: 12/09/2015  Primary Cardiologist: Dr. [facility] Problem List     Principal Problem:   Syncope Active Problems:   PAF (paroxysmal atrial fibrillation) (HCC)   CAD (coronary artery disease)   Chronic diastolic heart failure (HCC)   Hypotension    Subjective   No complaints. Ready to go home. educated on how and when to take her BP  Inpatient Medications    Scheduled Meds: . amiodarone  100 mg Oral Daily  . carvedilol  6.25 mg Oral BID WC  . cholecalciferol  1,000 Units Oral Daily  . citalopram  10 mg Oral Daily  . clopidogrel  75 mg Oral Daily  . lisinopril  30 mg Oral Daily  . loratadine  10 mg Oral Daily  . multivitamin with minerals  1 tablet Oral Daily  . Rivaroxaban  15 mg Oral Q supper  . sodium chloride flush  3 mL Intravenous Q12H   Continuous Infusions:   PRN Meds: acetaminophen **OR** acetaminophen, HYDROcodone-acetaminophen, labetalol, menthol-cetylpyridinium, ondansetron **OR** ondansetron (ZOFRAN) IV, polyethylene glycol   Vital Signs    Vitals:   12/08/15 1900 12/08/15 2207 12/09/15 0200 12/09/15 0643  BP:  (!) 178/90 (!) 175/95 (!) 147/79  Pulse:  77  79  Resp:  19  18  Temp:  98.5 F (36.9 C)  98.4 F (36.9 C)  TempSrc:  Oral  Oral  SpO2:  99%  98%  Weight: 126 lb 1.7 oz (57.2 kg)     Height:        Intake/Output Summary (Last 24 hours) at 12/09/15 0906 Last data filed at 12/08/15 1700  Gross per 24 hour  Intake              460 ml  Output                0 ml  Net              460 ml   Filed Weights   12/05/15 2100 12/06/15 0500 12/08/15 1900  Weight: 137 lb 5.6 oz (62.3 kg) 112 lb 10.5 oz (51.1 kg) 126 lb 1.7 oz (57.2 kg)    Physical Exam   GEN: Well nourished, well developed, in no acute distress.  HEENT: Grossly normal.  Neck: Supple, no JVD, carotid bruits, or masses. Cardiac: RRR, no murmurs, rubs, or gallops. No clubbing, cyanosis, edema.  Radials/DP/PT 2+ and equal  bilaterally.  Respiratory:  Respirations regular and unlabored, clear to auscultation bilaterally. GI: Soft, nontender, nondistended, BS + x 4. MS: no deformity or atrophy. Skin: warm and dry, no rash. Neuro:  Strength and sensation are intact. Psych: AAOx3.  Normal affect.  Labs    CBC  Recent Labs  12/07/15 0517  WBC 4.7  HGB 12.3  HCT 37.8  MCV 84.4  PLT [id number]   Basic Metabolic Panel  Recent Labs  12/07/15 0517 12/08/15 0544  NA 138 138  K 4.2 4.6  CL 106 104  CO2 25 25  GLUCOSE 109* 101*  BUN 15 13  CREATININE 0.80 0.85  CALCIUM 10.5* 10.8*   Telemetry    A- paced- Personally Reviewed  ECG     A paced  - Personally Reviewed  Radiology    No results found.  Cardiac Studies   Carotid dopplers  Summary: Findings suggest 1-39% internal carotid artery stenosis bilaterally. Vertebral arteries are patent with antegrade flow.   Patient Profile     [name]  C [name] is a 75 y.o. female with a history of PAF and tachy-brady s/p PPM and LAA clipping, CAD s/p CABG (2016) and subsequent PCIs (05/2015), HTN who was admitted to [facility] on 12/06/15 with syncope.   Assessment & Plan    SYNCOPE: Enzymes negative.  Mild bilateral carotid plaque. Orthostatic positive on 12/07/15. 135/61 (lying), 108/71 (sitting) and 90/58 (standing). Dr. Percival Spanish suspected that this was related to BP meds and the NTG that she took.    PAF:  She has tachybrady and a pacemaker clip.  She also had a left atrial appendage clip at the time of CABG. She is on Xarelto 15mg  daily for CHADSVASC of 5 (CHF, HTN, age, vasc dz). Not sure why she is on 15mg  dose. Her renal function is normal. She has a low burden of this on device interrogation. She is up to date with device follow up. She seems to tolerate a low dose of amiodarone.  No change in therapy is indicated.   CAD/CABG:  She had a negative stress test at [facility] in Sept of this year.  No evidence of ischemia.  No further testing.   HTN:  This has  been difficult to control.  She has been intolerant of Norvasc and clonidine.  She had hyperkalemia and was taken off of spironolactone.  During a recent admit she was taken off of Toprol and started on Coreg.  She was also sent home on HCTZ. This has also been discontinued. Dr. Percival Spanish increased lisinopril to 30mg  daily. Will follow K on this.  CHRONIC DIASTOLIC HF: she seems to be euvolemic.   Dispo: home today. I have arranged for a BMET on Friday and a TOC appointment on 12/17/15 with [name] PA-C  Signed, Angelena Form, PA-C  12/09/2015, 9:06 AM  As above, patient seen and examined. She denies dyspnea or chest pain. Her recent syncopal episode was felt secondary to blood pressure medications and hypotension. Her blood pressure appears to be controlled at present. GFR is calculated at 51. We will continue xarelto 15 mg daily but follow renal function as outpt; may need to increase to 20 mg daily in the future. Pt can be DCed with plans as outlined above. Kirk Ruths, MD

## 2015-12-10 NOTE — Telephone Encounter (Signed)
Patient contacted regarding discharge from  on 12/09/15  Patient understands to follow up with provider barrett on 12/17/15 at 10 am at [facility] Patient understands discharge instructions? yes Patient understands medications and regiment? yes Patient understands to bring all medications to this visit? yes

## 2015-12-11 ENCOUNTER — Ambulatory Visit: Payer: Commercial Managed Care - HMO | Admitting: Cardiovascular Disease

## 2015-12-16 ENCOUNTER — Inpatient Hospital Stay (HOSPITAL_COMMUNITY)
Admission: EM | Admit: 2015-12-16 | Discharge: 2015-12-23 | DRG: 378 | Disposition: A | Discharge status: Home Health | Payer: Commercial Managed Care - HMO | Attending: Internal Medicine | Admitting: Internal Medicine

## 2015-12-16 DIAGNOSIS — I2510 Atherosclerotic heart disease of native coronary artery without angina pectoris: Secondary | ICD-10-CM | POA: Diagnosis present

## 2015-12-16 DIAGNOSIS — Z955 Presence of coronary angioplasty implant and graft: Secondary | ICD-10-CM

## 2015-12-16 DIAGNOSIS — Z888 Allergy status to other drugs, medicaments and biological substances status: Secondary | ICD-10-CM

## 2015-12-16 DIAGNOSIS — Z23 Encounter for immunization: Secondary | ICD-10-CM

## 2015-12-16 DIAGNOSIS — K921 Melena: Principal | ICD-10-CM | POA: Diagnosis present

## 2015-12-16 DIAGNOSIS — Z79899 Other long term (current) drug therapy: Secondary | ICD-10-CM

## 2015-12-16 DIAGNOSIS — W19XXXA Unspecified fall, initial encounter: Secondary | ICD-10-CM | POA: Diagnosis present

## 2015-12-16 DIAGNOSIS — I130 Hypertensive heart and chronic kidney disease with heart failure and stage 1 through stage 4 chronic kidney disease, or unspecified chronic kidney disease: Secondary | ICD-10-CM | POA: Diagnosis present

## 2015-12-16 DIAGNOSIS — Z950 Presence of cardiac pacemaker: Secondary | ICD-10-CM | POA: Diagnosis present

## 2015-12-16 DIAGNOSIS — D62 Acute posthemorrhagic anemia: Secondary | ICD-10-CM | POA: Diagnosis present

## 2015-12-16 DIAGNOSIS — R195 Other fecal abnormalities: Secondary | ICD-10-CM

## 2015-12-16 DIAGNOSIS — K5730 Diverticulosis of large intestine without perforation or abscess without bleeding: Secondary | ICD-10-CM | POA: Diagnosis present

## 2015-12-16 DIAGNOSIS — R509 Fever, unspecified: Secondary | ICD-10-CM | POA: Diagnosis present

## 2015-12-16 DIAGNOSIS — Z87891 Personal history of nicotine dependence: Secondary | ICD-10-CM

## 2015-12-16 DIAGNOSIS — N182 Chronic kidney disease, stage 2 (mild): Secondary | ICD-10-CM | POA: Diagnosis present

## 2015-12-16 DIAGNOSIS — K922 Gastrointestinal hemorrhage, unspecified: Secondary | ICD-10-CM | POA: Diagnosis present

## 2015-12-16 DIAGNOSIS — Z90722 Acquired absence of ovaries, bilateral: Secondary | ICD-10-CM

## 2015-12-16 DIAGNOSIS — S93104A Unspecified dislocation of right toe(s), initial encounter: Secondary | ICD-10-CM | POA: Diagnosis present

## 2015-12-16 DIAGNOSIS — Z7901 Long term (current) use of anticoagulants: Secondary | ICD-10-CM

## 2015-12-16 DIAGNOSIS — D122 Benign neoplasm of ascending colon: Secondary | ICD-10-CM

## 2015-12-16 DIAGNOSIS — IMO0001 Reserved for inherently not codable concepts without codable children: Secondary | ICD-10-CM

## 2015-12-16 DIAGNOSIS — I951 Orthostatic hypotension: Secondary | ICD-10-CM | POA: Diagnosis present

## 2015-12-16 DIAGNOSIS — M79674 Pain in right toe(s): Secondary | ICD-10-CM

## 2015-12-16 DIAGNOSIS — F329 Major depressive disorder, single episode, unspecified: Secondary | ICD-10-CM | POA: Diagnosis present

## 2015-12-16 DIAGNOSIS — I480 Paroxysmal atrial fibrillation: Secondary | ICD-10-CM | POA: Diagnosis present

## 2015-12-16 DIAGNOSIS — Z8249 Family history of ischemic heart disease and other diseases of the circulatory system: Secondary | ICD-10-CM

## 2015-12-16 DIAGNOSIS — R55 Syncope and collapse: Secondary | ICD-10-CM

## 2015-12-16 DIAGNOSIS — I5032 Chronic diastolic (congestive) heart failure: Secondary | ICD-10-CM | POA: Diagnosis present

## 2015-12-16 DIAGNOSIS — I4892 Unspecified atrial flutter: Secondary | ICD-10-CM | POA: Diagnosis present

## 2015-12-16 DIAGNOSIS — I10 Essential (primary) hypertension: Secondary | ICD-10-CM | POA: Diagnosis present

## 2015-12-16 DIAGNOSIS — Z981 Arthrodesis status: Secondary | ICD-10-CM

## 2015-12-16 DIAGNOSIS — Z9049 Acquired absence of other specified parts of digestive tract: Secondary | ICD-10-CM

## 2015-12-16 DIAGNOSIS — I252 Old myocardial infarction: Secondary | ICD-10-CM

## 2015-12-16 DIAGNOSIS — I083 Combined rheumatic disorders of mitral, aortic and tricuspid valves: Secondary | ICD-10-CM | POA: Diagnosis present

## 2015-12-16 DIAGNOSIS — Z7902 Long term (current) use of antithrombotics/antiplatelets: Secondary | ICD-10-CM

## 2015-12-16 DIAGNOSIS — F419 Anxiety disorder, unspecified: Secondary | ICD-10-CM | POA: Diagnosis present

## 2015-12-16 DIAGNOSIS — Z951 Presence of aortocoronary bypass graft: Secondary | ICD-10-CM

## 2015-12-16 DIAGNOSIS — I739 Peripheral vascular disease, unspecified: Secondary | ICD-10-CM | POA: Diagnosis present

## 2015-12-16 DIAGNOSIS — I25118 Atherosclerotic heart disease of native coronary artery with other forms of angina pectoris: Secondary | ICD-10-CM | POA: Diagnosis present

## 2015-12-16 DIAGNOSIS — E785 Hyperlipidemia, unspecified: Secondary | ICD-10-CM | POA: Diagnosis present

## 2015-12-16 DIAGNOSIS — K648 Other hemorrhoids: Secondary | ICD-10-CM | POA: Diagnosis present

## 2015-12-16 HISTORY — DX: Atherosclerotic heart disease of native coronary artery without angina pectoris: I25.10

## 2015-12-16 HISTORY — DX: Hyperkalemia: E87.5

## 2015-12-16 HISTORY — DX: Unspecified atrial flutter: I48.92

## 2015-12-16 HISTORY — DX: Hyperlipidemia, unspecified: E78.5

## 2015-12-16 HISTORY — DX: Essential (primary) hypertension: I10

## 2015-12-16 HISTORY — DX: Hypercalcemia: E83.52

## 2015-12-16 HISTORY — DX: Alcohol use, unspecified, uncomplicated: F10.90

## 2015-12-16 HISTORY — DX: Chronic kidney disease, stage 2 (mild): N18.2

## 2015-12-16 HISTORY — DX: Sick sinus syndrome: I49.5

## 2015-12-16 HISTORY — DX: Other problems related to lifestyle: Z72.89

## 2015-12-16 HISTORY — DX: Chronic diastolic (congestive) heart failure: I50.32

## 2015-12-16 HISTORY — DX: Chronic fatigue, unspecified: R53.82

## 2015-12-16 HISTORY — DX: Syncope and collapse: R55

## 2015-12-16 HISTORY — DX: Orthostatic hypotension: I95.1

## 2015-12-16 LAB — CBG MONITORING, ED: Glucose-Capillary: 123 mg/dL — ABNORMAL HIGH (ref 65–99)

## 2015-12-16 NOTE — ED Notes (Signed)
Pt. CBG 123.RN,Abby made aware.

## 2015-12-16 NOTE — ED Triage Notes (Signed)
Pt comes from home after having a syncopal episode after using the bathroom. Grandaughter lowered her to the ground. No head trauma. Hx of recent HTN and started new medication that has begun dropping her BP too low. C/O dizziness, nausea. Alert and oriented. 20g LAC.

## 2015-12-16 NOTE — ED Notes (Signed)
EKG given to EDP,Goldston,.MD., for review. 

## 2015-12-17 ENCOUNTER — Inpatient Hospital Stay (HOSPITAL_COMMUNITY): Payer: Commercial Managed Care - HMO

## 2015-12-17 ENCOUNTER — Telehealth: Payer: Self-pay | Admitting: Cardiovascular Disease

## 2015-12-17 ENCOUNTER — Ambulatory Visit: Payer: Commercial Managed Care - HMO | Admitting: Physician Assistant

## 2015-12-17 ENCOUNTER — Emergency Department (HOSPITAL_COMMUNITY): Payer: Commercial Managed Care - HMO

## 2015-12-17 ENCOUNTER — Encounter (HOSPITAL_COMMUNITY): Payer: Self-pay

## 2015-12-17 DIAGNOSIS — W19XXXA Unspecified fall, initial encounter: Secondary | ICD-10-CM | POA: Diagnosis present

## 2015-12-17 DIAGNOSIS — D62 Acute posthemorrhagic anemia: Secondary | ICD-10-CM | POA: Diagnosis present

## 2015-12-17 DIAGNOSIS — Z8249 Family history of ischemic heart disease and other diseases of the circulatory system: Secondary | ICD-10-CM | POA: Diagnosis not present

## 2015-12-17 DIAGNOSIS — I252 Old myocardial infarction: Secondary | ICD-10-CM | POA: Diagnosis not present

## 2015-12-17 DIAGNOSIS — I25118 Atherosclerotic heart disease of native coronary artery with other forms of angina pectoris: Secondary | ICD-10-CM | POA: Diagnosis present

## 2015-12-17 DIAGNOSIS — I130 Hypertensive heart and chronic kidney disease with heart failure and stage 1 through stage 4 chronic kidney disease, or unspecified chronic kidney disease: Secondary | ICD-10-CM | POA: Diagnosis not present

## 2015-12-17 DIAGNOSIS — I5032 Chronic diastolic (congestive) heart failure: Secondary | ICD-10-CM | POA: Diagnosis not present

## 2015-12-17 DIAGNOSIS — K921 Melena: Secondary | ICD-10-CM | POA: Diagnosis not present

## 2015-12-17 DIAGNOSIS — I480 Paroxysmal atrial fibrillation: Secondary | ICD-10-CM | POA: Diagnosis not present

## 2015-12-17 DIAGNOSIS — Z7901 Long term (current) use of anticoagulants: Secondary | ICD-10-CM | POA: Diagnosis not present

## 2015-12-17 DIAGNOSIS — R195 Other fecal abnormalities: Secondary | ICD-10-CM | POA: Diagnosis not present

## 2015-12-17 DIAGNOSIS — M79674 Pain in right toe(s): Secondary | ICD-10-CM | POA: Diagnosis present

## 2015-12-17 DIAGNOSIS — K922 Gastrointestinal hemorrhage, unspecified: Secondary | ICD-10-CM | POA: Diagnosis not present

## 2015-12-17 DIAGNOSIS — I2510 Atherosclerotic heart disease of native coronary artery without angina pectoris: Secondary | ICD-10-CM

## 2015-12-17 DIAGNOSIS — F419 Anxiety disorder, unspecified: Secondary | ICD-10-CM | POA: Diagnosis not present

## 2015-12-17 DIAGNOSIS — Z9049 Acquired absence of other specified parts of digestive tract: Secondary | ICD-10-CM | POA: Diagnosis not present

## 2015-12-17 DIAGNOSIS — S93104A Unspecified dislocation of right toe(s), initial encounter: Secondary | ICD-10-CM | POA: Diagnosis present

## 2015-12-17 DIAGNOSIS — Z951 Presence of aortocoronary bypass graft: Secondary | ICD-10-CM | POA: Diagnosis not present

## 2015-12-17 DIAGNOSIS — I951 Orthostatic hypotension: Secondary | ICD-10-CM | POA: Diagnosis present

## 2015-12-17 DIAGNOSIS — I10 Essential (primary) hypertension: Secondary | ICD-10-CM

## 2015-12-17 DIAGNOSIS — N182 Chronic kidney disease, stage 2 (mild): Secondary | ICD-10-CM | POA: Diagnosis not present

## 2015-12-17 DIAGNOSIS — E785 Hyperlipidemia, unspecified: Secondary | ICD-10-CM | POA: Diagnosis not present

## 2015-12-17 DIAGNOSIS — Z79899 Other long term (current) drug therapy: Secondary | ICD-10-CM | POA: Diagnosis not present

## 2015-12-17 DIAGNOSIS — R55 Syncope and collapse: Secondary | ICD-10-CM

## 2015-12-17 DIAGNOSIS — I083 Combined rheumatic disorders of mitral, aortic and tricuspid valves: Secondary | ICD-10-CM | POA: Diagnosis not present

## 2015-12-17 DIAGNOSIS — K648 Other hemorrhoids: Secondary | ICD-10-CM | POA: Diagnosis present

## 2015-12-17 DIAGNOSIS — D122 Benign neoplasm of ascending colon: Secondary | ICD-10-CM | POA: Diagnosis present

## 2015-12-17 DIAGNOSIS — I25708 Atherosclerosis of coronary artery bypass graft(s), unspecified, with other forms of angina pectoris: Secondary | ICD-10-CM | POA: Diagnosis not present

## 2015-12-17 DIAGNOSIS — Z23 Encounter for immunization: Secondary | ICD-10-CM | POA: Diagnosis not present

## 2015-12-17 DIAGNOSIS — I739 Peripheral vascular disease, unspecified: Secondary | ICD-10-CM | POA: Diagnosis present

## 2015-12-17 DIAGNOSIS — F329 Major depressive disorder, single episode, unspecified: Secondary | ICD-10-CM | POA: Diagnosis not present

## 2015-12-17 DIAGNOSIS — K5730 Diverticulosis of large intestine without perforation or abscess without bleeding: Secondary | ICD-10-CM | POA: Diagnosis present

## 2015-12-17 LAB — BASIC METABOLIC PANEL
Anion gap: 4 — ABNORMAL LOW (ref 5–15)
BUN: 51 mg/dL — ABNORMAL HIGH (ref 6–20)
CO2: 27 mmol/L (ref 22–32)
Calcium: 11 mg/dL — ABNORMAL HIGH (ref 8.9–10.3)
Chloride: 104 mmol/L (ref 101–111)
Creatinine, Ser: 1 mg/dL (ref 0.44–1.00)
GFR calc Af Amer: >60 mL/min (ref >60)
GFR calc non Af Amer: 54 mL/min — ABNORMAL LOW (ref >60)
Glucose, Bld: 147 mg/dL — ABNORMAL HIGH (ref 65–99)
Potassium: 3.7 mmol/L (ref 3.5–5.1)
Sodium: 135 mmol/L (ref 135–145)

## 2015-12-17 LAB — COMPREHENSIVE METABOLIC PANEL
ALT: 15 U/L (ref 14–54)
AST: 19 U/L (ref 15–41)
Albumin: 3.4 g/dL — ABNORMAL LOW (ref 3.5–5.0)
Alkaline Phosphatase: 70 U/L (ref 38–126)
Anion gap: 6 (ref 5–15)
BUN: 45 mg/dL — ABNORMAL HIGH (ref 6–20)
CO2: 27 mmol/L (ref 22–32)
Calcium: 10.4 mg/dL — ABNORMAL HIGH (ref 8.9–10.3)
Chloride: 104 mmol/L (ref 101–111)
Creatinine, Ser: 0.91 mg/dL (ref 0.44–1.00)
GFR calc Af Amer: >60 mL/min (ref >60)
GFR calc non Af Amer: >60 mL/min (ref >60)
Glucose, Bld: 127 mg/dL — ABNORMAL HIGH (ref 65–99)
Potassium: 3.8 mmol/L (ref 3.5–5.1)
Sodium: 137 mmol/L (ref 135–145)
Total Bilirubin: 0.6 mg/dL (ref 0.3–1.2)
Total Protein: 5.8 g/dL — ABNORMAL LOW (ref 6.5–8.1)

## 2015-12-17 LAB — CBC
HCT: 25 % — ABNORMAL LOW (ref 36.0–46.0)
HCT: 25.7 % — ABNORMAL LOW (ref 36.0–46.0)
HCT: 26.9 % — ABNORMAL LOW (ref 36.0–46.0)
HCT: 29.2 % — ABNORMAL LOW (ref 36.0–46.0)
Hemoglobin: 8.4 g/dL — ABNORMAL LOW (ref 12.0–15.0)
Hemoglobin: 8.6 g/dL — ABNORMAL LOW (ref 12.0–15.0)
Hemoglobin: 8.9 g/dL — ABNORMAL LOW (ref 12.0–15.0)
Hemoglobin: 9.9 g/dL — ABNORMAL LOW (ref 12.0–15.0)
MCH: 27.7 pg (ref 26.0–34.0)
MCH: 27.7 pg (ref 26.0–34.0)
MCH: 27.8 pg (ref 26.0–34.0)
MCH: 27.9 pg (ref 26.0–34.0)
MCHC: 33.1 g/dL (ref 30.0–36.0)
MCHC: 33.5 g/dL (ref 30.0–36.0)
MCHC: 33.6 g/dL (ref 30.0–36.0)
MCHC: 33.9 g/dL (ref 30.0–36.0)
MCV: 82.3 fL (ref 78.0–100.0)
MCV: 82.5 fL (ref 78.0–100.0)
MCV: 82.6 fL (ref 78.0–100.0)
MCV: 84.1 fL (ref 78.0–100.0)
Platelets: 230 10*3/uL (ref 150–400)
Platelets: 232 10*3/uL (ref 150–400)
Platelets: 237 10*3/uL (ref 150–400)
Platelets: 274 10*3/uL (ref 150–400)
RBC: 3.03 MIL/uL — ABNORMAL LOW (ref 3.87–5.11)
RBC: 3.11 MIL/uL — ABNORMAL LOW (ref 3.87–5.11)
RBC: 3.2 MIL/uL — ABNORMAL LOW (ref 3.87–5.11)
RBC: 3.55 MIL/uL — ABNORMAL LOW (ref 3.87–5.11)
RDW: 18.6 % — ABNORMAL HIGH (ref 11.5–15.5)
RDW: 18.7 % — ABNORMAL HIGH (ref 11.5–15.5)
RDW: 18.7 % — ABNORMAL HIGH (ref 11.5–15.5)
RDW: 18.8 % — ABNORMAL HIGH (ref 11.5–15.5)
WBC: 5.9 10*3/uL (ref 4.0–10.5)
WBC: 7.6 10*3/uL (ref 4.0–10.5)
WBC: 7.6 10*3/uL (ref 4.0–10.5)
WBC: 7.7 10*3/uL (ref 4.0–10.5)

## 2015-12-17 LAB — URINALYSIS, ROUTINE W REFLEX MICROSCOPIC
Bilirubin Urine: NEGATIVE
Glucose, UA: NEGATIVE mg/dL
Hgb urine dipstick: NEGATIVE
Ketones, ur: NEGATIVE mg/dL
Nitrite: NEGATIVE
Protein, ur: NEGATIVE mg/dL
Specific Gravity, Urine: 1.018 (ref 1.005–1.030)
pH: 6 (ref 5.0–8.0)

## 2015-12-17 LAB — GLUCOSE, CAPILLARY
Glucose-Capillary: 107 mg/dL — ABNORMAL HIGH (ref 65–99)
Glucose-Capillary: 98 mg/dL (ref 65–99)

## 2015-12-17 LAB — URINE MICROSCOPIC-ADD ON

## 2015-12-17 LAB — TROPONIN I: Troponin I: <0.03 ng/mL (ref <0.03)

## 2015-12-17 LAB — POC OCCULT BLOOD, ED: Fecal Occult Bld: POSITIVE — AB

## 2015-12-17 MED ORDER — BUPIVACAINE HCL (PF) 0.25 % IJ SOLN
10.0000 mL | Freq: Once | INTRAMUSCULAR | Status: DC
  Filled 2015-12-17: qty 30

## 2015-12-17 MED ORDER — LISINOPRIL 20 MG PO TABS
30.0000 mg | ORAL_TABLET | Freq: Every day | ORAL | Status: DC
  Administered 2015-12-17 – 2015-12-18 (×2): 30 mg via ORAL
  Filled 2015-12-17 (×2): qty 1

## 2015-12-17 MED ORDER — INFLUENZA VAC SPLIT QUAD 0.5 ML IM SUSY
0.5000 mL | PREFILLED_SYRINGE | INTRAMUSCULAR | Status: DC
  Filled 2015-12-17: qty 0.5

## 2015-12-17 MED ORDER — ACETAMINOPHEN 650 MG RE SUPP: 650.0000 mg | Freq: Four times a day (QID) | RECTAL | Status: DC | PRN

## 2015-12-17 MED ORDER — NITROGLYCERIN 0.4 MG SL SUBL: 0.4000 mg | SUBLINGUAL_TABLET | SUBLINGUAL | Status: DC | PRN

## 2015-12-17 MED ORDER — CITALOPRAM HYDROBROMIDE 20 MG PO TABS
10.0000 mg | ORAL_TABLET | Freq: Every day | ORAL | Status: DC
  Administered 2015-12-17 – 2015-12-23 (×6): 10 mg via ORAL
  Filled 2015-12-17 (×6): qty 1

## 2015-12-17 MED ORDER — ACETAMINOPHEN 325 MG PO TABS
650.0000 mg | ORAL_TABLET | Freq: Four times a day (QID) | ORAL | Status: DC | PRN
  Administered 2015-12-17: 650 mg via ORAL
  Filled 2015-12-17: qty 2

## 2015-12-17 MED ORDER — ACETAMINOPHEN 325 MG PO TABS: 650.0000 mg | ORAL_TABLET | Freq: Four times a day (QID) | ORAL | Status: DC | PRN

## 2015-12-17 MED ORDER — SODIUM CHLORIDE 0.9 % IV SOLN
8.0000 mg/h | INTRAVENOUS | Status: DC
  Filled 2015-12-17 (×5): qty 80

## 2015-12-17 MED ORDER — SODIUM CHLORIDE 0.9 % IV SOLN
80.0000 mg | Freq: Once | INTRAVENOUS | Status: DC
  Filled 2015-12-17: qty 80

## 2015-12-17 MED ORDER — SODIUM CHLORIDE 0.9 % IV BOLUS (SEPSIS)
1000.0000 mL | Freq: Once | INTRAVENOUS | Status: AC
  Administered 2015-12-17: 1000 mL via INTRAVENOUS

## 2015-12-17 MED ORDER — ONDANSETRON HCL 4 MG PO TABS: 4.0000 mg | ORAL_TABLET | Freq: Four times a day (QID) | ORAL | Status: DC | PRN

## 2015-12-17 MED ORDER — CARVEDILOL 6.25 MG PO TABS
6.2500 mg | ORAL_TABLET | Freq: Two times a day (BID) | ORAL | Status: DC
  Administered 2015-12-17 – 2015-12-18 (×2): 6.25 mg via ORAL
  Filled 2015-12-17 (×3): qty 1

## 2015-12-17 MED ORDER — LIDOCAINE HCL (PF) 0.5 % IJ SOLN
10.0000 mL | Freq: Once | INTRAMUSCULAR | Status: DC
  Filled 2015-12-17: qty 50

## 2015-12-17 MED ORDER — AMIODARONE HCL 100 MG PO TABS
100.0000 mg | ORAL_TABLET | Freq: Every day | ORAL | Status: DC
  Administered 2015-12-17 – 2015-12-23 (×6): 100 mg via ORAL
  Filled 2015-12-17 (×5): qty 1

## 2015-12-17 MED ORDER — ONDANSETRON HCL 4 MG/2ML IJ SOLN
4.0000 mg | Freq: Four times a day (QID) | INTRAMUSCULAR | Status: DC | PRN
  Administered 2015-12-18: 4 mg via INTRAVENOUS
  Filled 2015-12-17: qty 2

## 2015-12-17 MED ORDER — PANTOPRAZOLE SODIUM 40 MG IV SOLR: 40.0000 mg | Freq: Two times a day (BID) | INTRAVENOUS | Status: DC

## 2015-12-17 NOTE — Telephone Encounter (Signed)
New message  Pt called, is in the hospital again, syncope  Needs to cancel today's appt, appt shows pt has arrived  Please call pt back

## 2015-12-17 NOTE — Care Management Note (Signed)
Case Management Note  Patient Details  Name: [name] MRN: [id number] Date of Birth: Jun 05, 1940  Subjective/Objective:75 y/o f admitted w/GIB. From home.                    Action/Plan:   Expected Discharge Date:                  Expected Discharge Plan:  Home/Self Care  In-House Referral:     Discharge planning Services  CM Consult  Post Acute Care Choice:    Choice offered to:     DME Arranged:    DME Agency:     HH Arranged:    HH Agency:     Status of Service:  In process, will continue to follow  If discussed at Long Length of Stay Meetings, dates discussed:    Additional Comments:  Dessa Phi, RN 12/17/2015, 3:51 PM

## 2015-12-17 NOTE — Consult Note (Signed)
ORTHOPAEDIC CONSULTATION  REQUESTING PHYSICIAN: Wallis Bamberg, MD  Chief Complaint: right 2nd toe dislocation  HPI: [name] is a 75 y.o. female who complains of a syncopal fall and pain at her R second toe. No other extremity c/o  Past Medical History:  Diagnosis Date  . ANXIETY 12/31/2009  . Aortic insufficiency    a. mod by echo 2017.  Marland Kitchen CAD (coronary artery disease)    a. s/p CABGx3 and LAA clipping in 12/2014, NSTEMI 05/2015 s/p DES to native RCA and SVG-dRCA; occluded ramus-SVG was treated medically). c. neg nuc 10/2015 at [facility].  . Chronic diastolic CHF (congestive heart failure) ([place])   . Chronic fatigue   . CKD (chronic kidney disease), stage II   . DEPRESSION 12/31/2009  . [place] DISEASE, CERVICAL 12/31/2009  . [place] DISEASE, LUMBAR 12/31/2009  . DIVERTICULITIS, HX OF 12/31/2009  . Essential hypertension   . GLUCOSE INTOLERANCE 12/31/2009  . Habitual alcohol use   . Hypercalcemia   . Hyperkalemia   . Hyperlipidemia 12/31/2009  . MENOPAUSE, EARLY 12/31/2009  . NSTEMI (non-ST elevated myocardial infarction) ([place]) 05/11/2015  . Orthostatic hypotension   . OSTEOARTHRITIS, HAND   . Pacemaker 01/21/2012   MDT Adapta dual chamber  . Paroxysmal atrial flutter ([place])   . Persistent atrial fibrillation ([place])   . Pre-diabetes   . PVD (peripheral vascular disease), hx stents to bil SFAs 02/2010   . Symptomatic sinus bradycardia 01/22/2012  . Syncope    a. 11/2015 ? due to medications.  . Tachy-brady syndrome ([place])    a. s/p MDT PPM 2013.  . Tricuspid regurgitation    Past Surgical History:  Procedure Laterality Date  . AUGMENTATION MAMMAPLASTY  1983  . CARDIAC CATHETERIZATION N/A 01/01/2015   Procedure: Left Heart Cath and Coronary Angiography;  Surgeon: Jettie Booze, MD;  Location: [place] [facility];  Service: Cardiovascular;  Laterality: N/A;  . CARDIAC CATHETERIZATION N/A 05/12/2015   Procedure: Left Heart Cath and Coronary Angiography;  Surgeon:  Troy Sine, MD;  Location: [place] [facility];  Service: Cardiovascular;  Laterality: N/A;  . CARDIAC CATHETERIZATION N/A 05/12/2015   Procedure: Coronary Stent Intervention;  Surgeon: Troy Sine, MD;  Location: [place] [facility];  Service: Cardiovascular;  Laterality: N/A;  . CARDIOVERSION N/A 02/01/2015   Procedure: CARDIOVERSION;  Surgeon: Lelon Perla, MD;  Location: [facility] ENDOSCOPY;  Service: Cardiovascular;  Laterality: N/A;  . CARDIOVERSION N/A 08/30/2015   Procedure: CARDIOVERSION;  Surgeon: Sanda Klein, MD;  Location: [place] ENDOSCOPY;  Service: Cardiovascular;  Laterality: N/A;  . CARPAL TUNNEL RELEASE  2008   "right hand/thumb; carpal tunnel repair; got rid of arthritis" (01/21/2012)  . CLIPPING OF ATRIAL APPENDAGE N/A 01/04/2015   Procedure: CLIPPING OF ATRIAL APPENDAGE;  Surgeon: Grace Isaac, MD;  Location: [place];  Service: Open Heart Surgery;  Laterality: N/A;  . CORONARY ARTERY BYPASS GRAFT N/A 01/04/2015   Procedure: CORONARY ARTERY BYPASS GRAFTING (CABG) x 3 using left internal mammory artery and greater saphenous vein right leg harvested endoscopically.;  Surgeon: Grace Isaac, MD;  LIMA-LAD, SVG-RI, SVG-PDA  . FACELIFT, LOWER 2/3  1995   "mini" (01/21/2012)  . Lower Arterial Examination  10/28/2011   R. SFA stent mild-moderate mixed density plaque with elevated velocities consistent with 50% diameter reduction. L. SFA stent moderate mixed denisty plaque at mid to distal level consistent with 50-69% diameter reduction.  . OOPHORECTOMY  ~1979  . PARTIAL COLECTOMY  2010  .  PERIPHERAL ARTERIAL STENT GRAFT  2012; 2012   "LLE; RLE" (01/21/2012)  . PERMANENT PACEMAKER INSERTION N/A 01/21/2012   Medtronic Adapta L implanted by Dr Sallyanne Kuster for tachy/brady syndrome  . POSTERIOR CERVICAL LAMINECTOMY  1985  . TEE WITHOUT CARDIOVERSION N/A 01/04/2015   Procedure: TRANSESOPHAGEAL ECHOCARDIOGRAM (TEE);  Surgeon: Grace Isaac, MD;  Location: [place];  Service: Open  Heart Surgery;  Laterality: N/A;  . TEE WITHOUT CARDIOVERSION N/A 02/01/2015   Procedure: TRANSESOPHAGEAL ECHOCARDIOGRAM (TEE);  Surgeon: Lelon Perla, MD;  Location: [facility] ENDOSCOPY;  Service: Cardiovascular;  Laterality: N/A;  . TEE WITHOUT CARDIOVERSION N/A 08/30/2015   Procedure: TRANSESOPHAGEAL ECHOCARDIOGRAM (TEE);  Surgeon: Sanda Klein, MD;  Location: [place];  Service: Cardiovascular;  Laterality: N/A;  . [place] History   Social History  . Marital status: Widowed    Spouse name: N/A  . Number of children: 3  . Years of education: N/A   Occupational History  . Retired Chemical engineer Retired   Social History Main Topics  . Smoking status: Former Smoker    Packs/day: 0.75    Years: 10.00    Types: Cigarettes  . Smokeless tobacco: Never Used     Comment: 01/21/2012 "quit smoking ~ 2002"  . Alcohol use Yes     Comment: 01/21/2012 "3 times/wk I have a couple mixed drinks"; 06/28/2015 "nothing in the last 3 weeks cause I haven't felt good", reduced recently  . Drug use: No  . Sexual activity: Not Currently   Other Topics Concern  . None   Social History Narrative   Lives in [place].  Retired.   Family History  Problem Relation Age of Onset  . Heart disease Mother   . Hypertension Mother   . Stroke Mother   . Stroke Father   . Heart disease Father   . Heart disease Brother   . Hypertension Brother     2 brothers  . CVA Brother     2 brothers  . Heart attack Brother     2 brothers   Allergies  Allergen Reactions  . Brilinta [Ticagrelor] Shortness Of Breath  . Clonidine Derivatives Other (See Comments)    Lowers heart rate  . Atorvastatin Other (See Comments)    Possible cause of fatigue/malaise  . Crestor [Rosuvastatin Calcium] Other (See Comments)    Joint/muscle aches  . Spironolactone     Contraindicated with history of hyperkalemia  . Exforge [Amlodipine Besylate-Valsartan] Itching and Rash   Prior to  Admission medications   Medication Sig Start Date End Date Taking? Authorizing Provider  amiodarone (PACERONE) 100 MG tablet Take 1 tablet (100 mg total) by mouth daily. Patient taking differently: Take 100 mg by mouth every morning.  10/17/15  Yes Mihai Croitoru, MD  carvedilol (COREG) 6.25 MG tablet Take 1 tablet (6.25 mg total) by mouth 2 (two) times daily with a meal. 12/09/15  Yes Theodis Blaze, MD  cholecalciferol (VITAMIN D) 1000 UNITS tablet Take 1,000 Units by mouth at bedtime.    Yes Historical Provider, MD  citalopram (CELEXA) 20 MG tablet Take 0.5 tablets (10 mg total) by mouth daily. Patient taking differently: Take 10 mg by mouth every morning.  12/09/15  Yes Theodis Blaze, MD  clopidogrel (PLAVIX) 75 MG tablet TAKE 1 TABLET (75 MG TOTAL) BY MOUTH DAILY. Patient taking differently: Take 75 mg by mouth every morning.  12/02/15  Yes Mihai Croitoru, MD  Coenzyme Q10 (COQ10 PO) Take 120 mg by  mouth at bedtime.    Yes Historical Provider, MD  hydrochlorothiazide (HYDRODIURIL) 25 MG tablet Take 25 mg by mouth once.   Yes Historical Provider, MD  lisinopril (PRINIVIL,ZESTRIL) 30 MG tablet Take 1 tablet (30 mg total) by mouth daily. Patient taking differently: Take 30 mg by mouth every morning.  12/09/15  Yes Theodis Blaze, MD  Multiple Vitamin (MULTIVITAMIN) capsule Take 1 capsule by mouth 2 (two) times daily.    Yes Historical Provider, MD  nitroGLYCERIN (NITROSTAT) 0.4 MG SL tablet Place 1 tablet (0.4 mg total) under the tongue every 5 (five) minutes x 3 doses as needed for chest pain. 05/15/15  Yes Brittainy Erie Noe, PA-C  rivaroxaban (XARELTO) 15 MG TABS tablet Take 1 tablet (15 mg total) by mouth daily with supper. 11/04/15  Yes Sanda Klein, MD   Dg Toe 2nd Right  Result Date: 12/17/2015 CLINICAL DATA:  Right second toe pain after a fall today. Syncopal episode. EXAM: RIGHT SECOND TOE COMPARISON:  None. FINDINGS: Dorsal dislocation of the middle phalanx of the right second toe with  respect to the proximal phalanx with tiny bone fragment inferiorly suggesting an avulsion fracture. Soft tissue swelling. IMPRESSION: Dislocation of the proximal interphalangeal joint of the right second toe with tiny avulsion fracture. Electronically Signed   By: Lucienne Capers M.D.   On: 12/17/2015 01:29    Positive ROS: All other systems have been reviewed and were otherwise negative with the exception of those mentioned in the HPI and as above.  Labs cbc  Recent Labs  12/17/15 0647 12/17/15 1050  WBC 5.9 7.6  HGB 8.9* 8.6*  HCT 26.9* 25.7*  PLT 237 230    Labs inflam No results for input(s): CRP in the last 72 hours.  Invalid input(s): ESR  Labs coag No results for input(s): INR, PTT in the last 72 hours.  Invalid input(s): PT   Recent Labs  12/16/15 2343 12/17/15 0647  NA 135 137  K 3.7 3.8  CL 104 104  CO2 27 27  GLUCOSE 147* 127*  BUN 51* 45*  CREATININE 1.00 0.91  CALCIUM 11.0* 10.4*    Physical Exam: Vitals:   12/17/15 0248 12/17/15 0500  BP: (!) 148/61 121/68  Pulse: 79 79  Resp: 14 16  Temp: 98.4 F (36.9 C) 98.6 F (37 C)   General: Alert, no acute distress Cardiovascular: No pedal edema Respiratory: No cyanosis, no use of accessory musculature GI: No organomegaly, abdomen is soft and non-tender Skin: No lesions in the area of chief complaint other than those listed below in MSK exam.  Neurologic: Sensation intact distally save for the below mentioned MSK exam Psychiatric: Patient is competent for consent with normal mood and affect Lymphatic: No axillary or cervical lymphadenopathy  MUSCULOSKELETAL:  RLE: dorsal displacment at PIP, NVI, Toe is WWP. No other tenderness. Other extremities are atraumatic with painless ROM and NVI.  Assessment: Right 2nd toe P2 fx dislocation  Plan: CLosed reduction and buddy tape +/- cast shoe PRN F/u with me in 1-2 wks.   PROCEDURE: after a time out I performed a closed reduction of her fracture  dislocation.     Renette Butters, MD Cell [phone]   12/17/2015 1:33 PM

## 2015-12-17 NOTE — Progress Notes (Signed)
Patient seen and evaluated.  Vital signs stable, H/H decreased this am from yesterday.  GI consulted and will see patient.  Continue to hold plavix and xarelto at this time.  Orthopedics consulted for right second toe dislocation. Continue current treatment plan of PPI, serial CBC, and IVF.  Will undergo EGD in am.

## 2015-12-17 NOTE — ED Notes (Signed)
Pt. Orthostatic vital signs discontinued per EDP,[place],MD.

## 2015-12-17 NOTE — ED Provider Notes (Signed)
[facility] DEPT Provider Note   CSN: [id number] Arrival date & time: 12/16/15  2248  By signing my name below, I, [name], attest that this documentation has been prepared under the direction and in the presence of Ripley Fraise, MD. Electronically Signed: Judithann Sauger, ED Scribe. 12/17/15. 12:44 AM.   History   Chief Complaint Chief Complaint  Patient presents with  . Loss of Consciousness    HPI Comments: [name] is a 75 y.o. female with a hx of hypertension, HLD, PAF, CAD s/p CABG, chronic diastolic heart failure, and pacemaker in place who presents to the Emergency Department for evaluation s/p witnessed syncopal episode that occurred PTA. She notes that she has not been feeling at her baseline all week and has not been getting out of bed for fear that she would have a syncopal episode. She states that she had two episodes of black stools yesterday. She explains that she finished brushing her teeth prior to tonight's episode and is unsure of how long the episode lasted. She states that she was with her granddaughter who lowered her to the ground and granddaughter reported that she witnessed gurgling. No head injuries sustained but pt reports associated mild headache. She states that she took hydrochlorothiazide today by accident; pt is not supposed to take it per advise of her PCP. She adds that she believes taking this medicine was the "straw that broke the camel's back" as she has been having near-syncopal episodes all week. No alleviating factors noted. Pt has not tried any medications PTA. She is currently on Xarelto. She has an allergy to Brilinta, Clonidine, Atorvastatin, Crestor, and Exforge. She denies any fever, chest pain, SOB, abdominal pain, vomiting, diarrhea, or any other associated symptoms.   She also c/o persistent moderate pain to her right 2nd toe with bruising onset yesterday. She reports that she stubbed her toe. No alleviating factors noted. No  other complaints at this time.   The history is provided by the patient. No language interpreter was used.  Loss of Consciousness   This is a new problem. The current episode started 1 to 2 hours ago. The problem has been resolved. Length of episode of loss of consciousness: unsure of time. The problem is associated with normal activity. Associated symptoms include headaches. Pertinent negatives include abdominal pain, chest pain, fever, nausea and vomiting. She has tried nothing for the symptoms. Her past medical history is significant for CAD and HTN.    Past Medical History:  Diagnosis Date  . ABDOMINAL PAIN, LOWER 12/31/2009  . ANXIETY 12/31/2009  . Aortic insufficiency   . DEPRESSION 12/31/2009  . [place] DISEASE, CERVICAL 12/31/2009  . [place] DISEASE, LUMBAR 12/31/2009  . DIVERTICULITIS, HX OF 12/31/2009  . GLUCOSE INTOLERANCE 12/31/2009  . HYPERLIPIDEMIA 12/31/2009  . HYPERTENSION 12/31/2009  . MENOPAUSE, EARLY 12/31/2009  . MITRAL REGURGITATION 12/31/2009  . MITRAL VALVE PROLAPSE 12/31/2009  . NSTEMI (non-ST elevated myocardial infarction) ([place]) 05/11/2015  . OSTEOARTHRITIS, HAND 12/31/2009  . Pacemaker 01/21/2012   MDT Adapta dual chamber  . Persistent atrial fibrillation ([place]) 01/22/2012  . PVD (peripheral vascular disease), hx stents to bil SFAs 02/2010 01/22/2012  . Symptomatic sinus bradycardia 01/22/2012  . Tricuspid regurgitation     Patient Active Problem List   Diagnosis Date Noted  . Syncope 12/05/2015  . Hypotension 12/05/2015  . Hyperkalemia 12/04/2015  . H/O amiodarone therapy 12/04/2015  . Headache 12/04/2015  . Chronic diastolic heart failure ([place]) 10/19/2015  . Hypercalcemia 10/19/2015  . Atypical atrial  flutter ([place])   . Atrial flutter ([place]) 08/23/2015  . Febrile illness, acute 06/27/2015  . Hypoxemia 06/27/2015  . Chronic fatigue 06/27/2015  . Bronchitis   . Pyrexia   . Stented coronary artery   . Chest pain 05/11/2015  . NSTEMI (non-ST elevated  myocardial infarction) ([place]) 05/11/2015  . S/P CABG x 3 01/04/15 01/10/2015  . CAD (coronary artery disease) 01/04/2015  . Accelerating angina ([place]) 01/01/2015  . Unstable angina pectoris ([place]) 12/28/2014  . Symptomatic sinus bradycardia 01/22/2012  . PAF (paroxysmal atrial fibrillation) ([place]) 01/22/2012  . Sinus node dysfunction ([place]) 01/22/2012  . S/P placement of cardiac pacemaker, medtronic adapta 01/21/12 01/22/2012  . PVD (peripheral vascular disease), hx stents to bil SFAs 02/2010 01/22/2012  . GLUCOSE INTOLERANCE 12/31/2009  . Hyperlipidemia 12/31/2009  . Anxiety 12/31/2009  . DEPRESSION 12/31/2009  . MITRAL REGURGITATION 12/31/2009  . Essential hypertension 12/31/2009  . Coronary atherosclerosis 12/31/2009  . MITRAL VALVE PROLAPSE 12/31/2009  . MENOPAUSE, EARLY 12/31/2009  . OSTEOARTHRITIS, HAND 12/31/2009  . [place] DISEASE, CERVICAL 12/31/2009  . [place] DISEASE, LUMBAR 12/31/2009  . UNSPECIFIED URINARY INCONTINENCE 12/31/2009  . ABDOMINAL PAIN, LOWER 12/31/2009  . DIVERTICULITIS, HX OF 12/31/2009    Past Surgical History:  Procedure Laterality Date  . AUGMENTATION MAMMAPLASTY  1983  . CARDIAC CATHETERIZATION  2005   "negative" (01/21/2012)  . CARDIAC CATHETERIZATION  09/17/1999   Mild CAD involving proximal portion of LAD. Mitral valve prolapse w/ mitral regurg. No evidence of renal artery stenosis. Recommended readjustment of antihypertensive medications.  Marland Kitchen CARDIAC CATHETERIZATION N/A 01/01/2015   Procedure: Left Heart Cath and Coronary Angiography;  Surgeon: Jettie Booze, MD;  Location: [facility];  Service: Cardiovascular;  Laterality: N/A;  . CARDIAC CATHETERIZATION N/A 05/12/2015   Procedure: Left Heart Cath and Coronary Angiography;  Surgeon: Troy Sine, MD;  Location: [facility];  Service: Cardiovascular;  Laterality: N/A;  . CARDIAC CATHETERIZATION N/A 05/12/2015   Procedure: Coronary Stent Intervention;  Surgeon: Troy Sine, MD;   Location: [facility];  Service: Cardiovascular;  Laterality: N/A;  . CARDIOVERSION N/A 02/01/2015   Procedure: CARDIOVERSION;  Surgeon: Lelon Perla, MD;  Location: [facility] ENDOSCOPY;  Service: Cardiovascular;  Laterality: N/A;  . CARDIOVERSION N/A 08/30/2015   Procedure: CARDIOVERSION;  Surgeon: Sanda Klein, MD;  Location: [facility] ENDOSCOPY;  Service: Cardiovascular;  Laterality: N/A;  . CARPAL TUNNEL RELEASE  2008   "right hand/thumb; carpal tunnel repair; got rid of arthritis" (01/21/2012)  . CLIPPING OF ATRIAL APPENDAGE N/A 01/04/2015   Procedure: CLIPPING OF ATRIAL APPENDAGE;  Surgeon: Grace Isaac, MD;  Location: [facility];  Service: Open Heart Surgery;  Laterality: N/A;  . CORONARY ARTERY BYPASS GRAFT N/A 01/04/2015   Procedure: CORONARY ARTERY BYPASS GRAFTING (CABG) x 3 using left internal mammory artery and greater saphenous vein right leg harvested endoscopically.;  Surgeon: Grace Isaac, MD;  LIMA-LAD, SVG-RI, SVG-PDA  . FACELIFT, LOWER 2/3  1995   "mini" (01/21/2012)  . Lower Arterial Examination  10/28/2011   R. SFA stent mild-moderate mixed density plaque with elevated velocities consistent with 50% diameter reduction. L. SFA stent moderate mixed denisty plaque at mid to distal level consistent with 50-69% diameter reduction.  . OOPHORECTOMY  ~1979  . PARTIAL COLECTOMY  2010  . PERIPHERAL ARTERIAL STENT GRAFT  2012; 2012   "LLE; RLE" (01/21/2012)  . PERMANENT PACEMAKER INSERTION N/A 01/21/2012   Medtronic Adapta L implanted by Dr Sallyanne Kuster for tachy/brady syndrome  . Persantine  Myoview Stress Test  02/25/2010   No scintigraphic evidence of inducible myocardial ischemia. No persantine EKG changes. Non-diagnositc for ishcemia. Low-risk, normal scan.  Marland Kitchen POSTERIOR CERVICAL LAMINECTOMY  1985  . TEE WITHOUT CARDIOVERSION N/A 01/04/2015   Procedure: TRANSESOPHAGEAL ECHOCARDIOGRAM (TEE);  Surgeon: Grace Isaac, MD;  Location: [facility];  Service: Open Heart Surgery;   Laterality: N/A;  . TEE WITHOUT CARDIOVERSION N/A 02/01/2015   Procedure: TRANSESOPHAGEAL ECHOCARDIOGRAM (TEE);  Surgeon: Lelon Perla, MD;  Location: [facility] ENDOSCOPY;  Service: Cardiovascular;  Laterality: N/A;  . TEE WITHOUT CARDIOVERSION N/A 08/30/2015   Procedure: TRANSESOPHAGEAL ECHOCARDIOGRAM (TEE);  Surgeon: Sanda Klein, MD;  Location: [facility] ENDOSCOPY;  Service: Cardiovascular;  Laterality: N/A;  . [place]    OB History    No data available       Home Medications    Prior to Admission medications   Medication Sig Start Date End Date Taking? Authorizing Provider  amiodarone (PACERONE) 100 MG tablet Take 1 tablet (100 mg total) by mouth daily. Patient taking differently: Take 100 mg by mouth every morning.  10/17/15  Yes Mihai Croitoru, MD  carvedilol (COREG) 6.25 MG tablet Take 1 tablet (6.25 mg total) by mouth 2 (two) times daily with a meal. 12/09/15  Yes Theodis Blaze, MD  cholecalciferol (VITAMIN D) 1000 UNITS tablet Take 1,000 Units by mouth at bedtime.    Yes Historical Provider, MD  citalopram (CELEXA) 20 MG tablet Take 0.5 tablets (10 mg total) by mouth daily. Patient taking differently: Take 10 mg by mouth every morning.  12/09/15  Yes Theodis Blaze, MD  clopidogrel (PLAVIX) 75 MG tablet TAKE 1 TABLET (75 MG TOTAL) BY MOUTH DAILY. Patient taking differently: Take 75 mg by mouth every morning.  12/02/15  Yes Mihai Croitoru, MD  Coenzyme Q10 (COQ10 PO) Take 120 mg by mouth at bedtime.    Yes Historical Provider, MD  hydrochlorothiazide (HYDRODIURIL) 25 MG tablet Take 25 mg by mouth once.   Yes Historical Provider, MD  lisinopril (PRINIVIL,ZESTRIL) 30 MG tablet Take 1 tablet (30 mg total) by mouth daily. Patient taking differently: Take 30 mg by mouth every morning.  12/09/15  Yes Theodis Blaze, MD  Multiple Vitamin (MULTIVITAMIN) capsule Take 1 capsule by mouth 2 (two) times daily.    Yes Historical Provider, MD  nitroGLYCERIN (NITROSTAT) 0.4 MG SL tablet  Place 1 tablet (0.4 mg total) under the tongue every 5 (five) minutes x 3 doses as needed for chest pain. 05/15/15  Yes Brittainy Erie Noe, PA-C  rivaroxaban (XARELTO) 15 MG TABS tablet Take 1 tablet (15 mg total) by mouth daily with supper. 11/04/15  Yes Sanda Klein, MD    Family History Family History  Problem Relation Age of Onset  . Heart disease Mother   . Hypertension Mother   . Stroke Mother   . Stroke Father   . Heart disease Father   . Heart disease Brother   . Hypertension Brother     2 brothers  . CVA Brother     2 brothers  . Heart attack Brother     2 brothers    Social History Social History  Substance Use Topics  . Smoking status: Former Smoker    Packs/day: 0.75    Years: 10.00    Types: Cigarettes  . Smokeless tobacco: Never Used     Comment: 01/21/2012 "quit smoking ~ 2002"  . Alcohol use Yes     Comment: 01/21/2012 "3 times/wk I have  a couple mixed drinks"; 06/28/2015 "nothing in the last 3 weeks cause I haven't felt good", reduced recently     Allergies   Brilinta [ticagrelor]; Clonidine derivatives; Atorvastatin; Crestor [rosuvastatin calcium]; and Exforge [amlodipine besylate-valsartan]   Review of Systems Review of Systems  Constitutional: Negative for fever.  Respiratory: Negative for shortness of breath.   Cardiovascular: Positive for syncope. Negative for chest pain.  Gastrointestinal: Positive for blood in stool (black stool). Negative for abdominal pain, nausea and vomiting.  Musculoskeletal: Positive for arthralgias.  Skin: Positive for wound (bruising).  Neurological: Positive for syncope and headaches.  All other systems reviewed and are negative.    Physical Exam Updated Vital Signs BP 133/56 (BP Location: Left Arm)   Pulse 78   Temp 98 F (36.7 C) (Oral)   Resp 18   SpO2 100%   Physical Exam  Nursing note and vitals reviewed.  CONSTITUTIONAL: Well developed/well nourished HEAD: Normocephalic/atraumatic EYES:  EOMI/PERRL ENMT: Mucous membranes moist NECK: supple no meningeal signs SPINE/BACK:entire spine nontender CV: S1/S2 noted, no murmurs/rubs/gallops noted LUNGS: Lungs are clear to auscultation bilaterally, no apparent distress ABDOMEN: soft, nontender, no rebound or guarding, bowel sounds noted throughout abdomen GU:no cva tenderness RECTAL: stool color black, hemoccult positive, nurse present for exam NEURO: Pt is awake/alert/appropriate, moves all extremitiesx4.  No facial droop.   EXTREMITIES: pulses normal/equal, full ROM; tenderness and bruising to right 2nd toe SKIN: warm, color normal PSYCH: no abnormalities of mood noted, alert and oriented to situation   ED Treatments / Results  DIAGNOSTIC STUDIES: Oxygen Saturation is 100% on RA, normal by my interpretation.    COORDINATION OF CARE: 12:40 AM- Pt advised of plan for treatment and pt agrees. Pt will receive lab work, x-ray of 2nd right toe, and EKG for further evaluation. She will also receive IV fluids.    Labs (all labs ordered are listed, but only abnormal results are displayed) Labs Reviewed  BASIC METABOLIC PANEL - Abnormal; Notable for the following:       Result Value   Glucose, Bld 147 (*)    BUN 51 (*)    Calcium 11.0 (*)    GFR calc non Af Amer 54 (*)    Anion gap 4 (*)    All other components within normal limits  CBC - Abnormal; Notable for the following:    RBC 3.55 (*)    Hemoglobin 9.9 (*)    HCT 29.2 (*)    RDW 18.6 (*)    All other components within normal limits  CBG MONITORING, ED - Abnormal; Notable for the following:    Glucose-Capillary 123 (*)    All other components within normal limits  POC OCCULT BLOOD, ED - Abnormal; Notable for the following:    Fecal Occult Bld POSITIVE (*)    All other components within normal limits  URINALYSIS, ROUTINE W REFLEX MICROSCOPIC (NOT AT [facility])  TYPE AND SCREEN    EKG  EKG Interpretation None       Radiology Dg Toe 2nd Right  Result Date:  12/17/2015 CLINICAL DATA:  Right second toe pain after a fall today. Syncopal episode. EXAM: RIGHT SECOND TOE COMPARISON:  None. FINDINGS: Dorsal dislocation of the middle phalanx of the right second toe with respect to the proximal phalanx with tiny bone fragment inferiorly suggesting an avulsion fracture. Soft tissue swelling. IMPRESSION: Dislocation of the proximal interphalangeal joint of the right second toe with tiny avulsion fracture. Electronically Signed   By: Oren Beckmann.D.  On: 12/17/2015 01:29    Procedures Reduction of dislocation Date/Time: 12/17/2015 1:59 AM Performed by: Ripley Fraise Authorized by: Ripley Fraise  Consent: Verbal consent obtained. Local anesthesia used: no  Anesthesia: Local anesthesia used: no Patient tolerance: Patient tolerated the procedure well with no immediate complications Comments: Unsuccessful closed reduction of right 2nd toe dislocation     (including critical care time)  Medications Ordered in ED Medications - No data to display   Initial Impression / Assessment and Plan / ED Course  Ripley Fraise, MD has reviewed the triage vital signs and the nursing notes.  Pertinent labs & imaging results that were available during my care of the patient were reviewed by me and considered in my medical decision making (see chart for details).  Clinical Course     Pt with recurrent syncopal episodes Recently it was felt due to hypotension when she was admitted Currently, pt with black stools and +hemoccult, and is on xarelto with drop in HGB since last admission Suspect occult GI bleed is contributing Will need admission and have anticoagulation adjusted Pt agreeable  Also - pt with dislocated right 2nd toe- after multiple attempts unable to reduce, will need ortho consult in AM  D/w dr Hal Hope for admission and need for GI and ortho consultation in the AM   Final Clinical Impressions(s) / ED Diagnoses   Final diagnoses:   Syncope, unspecified syncope type  Pain of toe of right foot  Gastrointestinal hemorrhage, unspecified gastrointestinal hemorrhage type    New Prescriptions New Prescriptions   No medications on file   I personally performed the services described in this documentation, which was scribed in my presence. The recorded information has been reviewed and is accurate.        Ripley Fraise, MD 12/17/15 0201

## 2015-12-17 NOTE — Consult Note (Signed)
Cardiology Consultation Note    Patient ID: [name], MRN: [id number], DOB/AGE: [birth year] 75 y.o. Admit date: 12/16/2015   Date of Consult: 12/17/2015 Primary Physician: [name] Solo, MD Primary Cardiologist: Dr. Sallyanne Kuster / EP: Dr. Rayann Heman  Chief Complaint: syncope Reason for Consultation: GI bleed on Xarelto and Plavix Requesting MD: Dr. Hal Hope  HPI: [name] is a 75 y.o. female with history of CAD (s/p CABGx3 and LAA clipping in 12/2014, NSTEMI 05/2015 s/p DES to native RCA and SVG-dRCA; occluded ramus-SVG was treated medically), paroxysmal atrial fib, paroxysmal atrial flutter, tachybrady syndrome s/p PPM 2013, probable CKD stage II, orthostatic hypotension, PAD s/p prior LE stenting, alcohol abuse, HTN, dyslipidemia, hyperkalemia, hypercalcemia, chronic diastolic CHF, moderate AI, syncope, & multiple medication intolerances who presented to [facility] with a GIB.  Her most recent PCI was in 05/2015 as above. In 08/2015 office note when patient was in atrial flutter, [name] notes "Incidentally, pt says that for the last few days she has taken been taking  xarelto samples and rivaroxaban  prescription both once a day and just caught  that they were the same drug last night." Amiodarone was started at that visit with subsequent spontaneous conversion to NSR by follow-up visits with Dr. Rayann Heman. Per phone note 10/2015 the patient called in reporting red spots on her skin; this was reviewed with pharmD and Xarelto was decreased to 15mg  daily based on her CrCl in the 40s She had a stress test at [facility] in 10/2015 that was negative for ischemia per notes. She was admitted 10/23-10/25 with headache, lightheadedness, chest pain, elevated BP and hyperkalemia. She was treated with kayexalate. Toprol was switched to HCTZ. She was readmitted 10/26-10/30 with an episode of syncope after taking her new medications at the pharmacy, prompting down-titration of meds and cessation of HCTZ. There was low  burden of AF by device interrogation per notes. 2D Echo 12/04/15: EF 55-60%, no RWMA, grade 1 DD, mod AI.  She has felt pre-syncopal all week per her report. She accidentally took an HCTZ yesterday when she was already feeling like she was going to pass out. She was standing up brushing her teeth when she began to feel like she may pass out. She called for her granddaughter who helped her to the floor as she lost consciousness briefly. She came right to. No b/b incontinence. No chest pain, SOB, palpitations. She was taken to the ER where Hgb found to be 9.9 -> 8.9 (12 last week), BUN 51 (hemoccult positive), Cr 1.0, calcium 11, trop neg. Toe x-ray showed dislocation of her R second toe with tiny avulsion fx, ED unable to reduce thus ortho consulted. She has remained hemodynamically stable thus far. She says she noticed black stools during last admission but didn't think to mention it. Her daughter helps affix her medication box especially in light of frequent med changes. She denies any slip-ups taking duplicate blood thinners as noted in July. Currently denies any acute complaint except just weak.   Past Medical History:  Diagnosis Date  . ANXIETY 12/31/2009  . Aortic insufficiency    a. mod by echo 2017.  [place] CAD (coronary artery disease)    a. s/p CABGx3 and LAA clipping in 12/2014, NSTEMI 05/2015 s/p DES to native RCA and SVG-dRCA; occluded ramus-SVG was treated medically). c. neg nuc 10/2015 at [facility].  . Chronic diastolic CHF (congestive heart failure) ([place])   . Chronic fatigue   . CKD (chronic kidney disease), stage II   .  DEPRESSION 12/31/2009  . [place] DISEASE, CERVICAL 12/31/2009  . [place] DISEASE, LUMBAR 12/31/2009  . DIVERTICULITIS, HX OF 12/31/2009  . Essential hypertension   . GLUCOSE INTOLERANCE 12/31/2009  . Habitual alcohol use   . Hypercalcemia   . Hyperkalemia   . Hyperlipidemia 12/31/2009  . MENOPAUSE, EARLY 12/31/2009  . NSTEMI (non-ST elevated myocardial infarction) ([place])  05/11/2015  . Orthostatic hypotension   . OSTEOARTHRITIS, HAND   . Pacemaker 01/21/2012   MDT Adapta dual chamber  . Paroxysmal atrial flutter ([place])   . Persistent atrial fibrillation ([place])   . PVD (peripheral vascular disease), hx stents to bil SFAs 02/2010   . Symptomatic sinus bradycardia 01/22/2012  . Syncope    a. 11/2015 ? due to medications.  . Tachy-brady syndrome ([place])    a. s/p MDT PPM 2013.  . Tricuspid regurgitation       Surgical History:  Past Surgical History:  Procedure Laterality Date  . AUGMENTATION MAMMAPLASTY  1983  . CARDIAC CATHETERIZATION N/A 01/01/2015   Procedure: Left Heart Cath and Coronary Angiography;  Surgeon: Jettie Booze, MD;  Location: [place] CV LAB;  Service: Cardiovascular;  Laterality: N/A;  . CARDIAC CATHETERIZATION N/A 05/12/2015   Procedure: Left Heart Cath and Coronary Angiography;  Surgeon: Troy Sine, MD;  Location: [place] CV LAB;  Service: Cardiovascular;  Laterality: N/A;  . CARDIAC CATHETERIZATION N/A 05/12/2015   Procedure: Coronary Stent Intervention;  Surgeon: Troy Sine, MD;  Location: [place] CV LAB;  Service: Cardiovascular;  Laterality: N/A;  . CARDIOVERSION N/A 02/01/2015   Procedure: CARDIOVERSION;  Surgeon: Lelon Perla, MD;  Location: [facility] ENDOSCOPY;  Service: Cardiovascular;  Laterality: N/A;  . CARDIOVERSION N/A 08/30/2015   Procedure: CARDIOVERSION;  Surgeon: Sanda Klein, MD;  Location: [place] ENDOSCOPY;  Service: Cardiovascular;  Laterality: N/A;  . CARPAL TUNNEL RELEASE  2008   "right hand/thumb; carpal tunnel repair; got rid of arthritis" (01/21/2012)  . CLIPPING OF ATRIAL APPENDAGE N/A 01/04/2015   Procedure: CLIPPING OF ATRIAL APPENDAGE;  Surgeon: Grace Isaac, MD;  Location: [place];  Service: Open Heart Surgery;  Laterality: N/A;  . CORONARY ARTERY BYPASS GRAFT N/A 01/04/2015   Procedure: CORONARY ARTERY BYPASS GRAFTING (CABG) x 3 using left internal mammory artery and greater saphenous vein  right leg harvested endoscopically.;  Surgeon: Grace Isaac, MD;  LIMA-LAD, SVG-RI, SVG-PDA  . FACELIFT, LOWER 2/3  1995   "mini" (01/21/2012)  . Lower Arterial Examination  10/28/2011   R. SFA stent mild-moderate mixed density plaque with elevated velocities consistent with 50% diameter reduction. L. SFA stent moderate mixed denisty plaque at mid to distal level consistent with 50-69% diameter reduction.  . OOPHORECTOMY  ~1979  . PARTIAL COLECTOMY  2010  . PERIPHERAL ARTERIAL STENT GRAFT  2012; 2012   "LLE; RLE" (01/21/2012)  . PERMANENT PACEMAKER INSERTION N/A 01/21/2012   Medtronic Adapta L implanted by Dr Sallyanne Kuster for tachy/brady syndrome  . POSTERIOR CERVICAL LAMINECTOMY  1985  . TEE WITHOUT CARDIOVERSION N/A 01/04/2015   Procedure: TRANSESOPHAGEAL ECHOCARDIOGRAM (TEE);  Surgeon: Grace Isaac, MD;  Location: [place];  Service: Open Heart Surgery;  Laterality: N/A;  . TEE WITHOUT CARDIOVERSION N/A 02/01/2015   Procedure: TRANSESOPHAGEAL ECHOCARDIOGRAM (TEE);  Surgeon: Lelon Perla, MD;  Location: [facility] ENDOSCOPY;  Service: Cardiovascular;  Laterality: N/A;  . TEE WITHOUT CARDIOVERSION N/A 08/30/2015   Procedure: TRANSESOPHAGEAL ECHOCARDIOGRAM (TEE);  Surgeon: Sanda Klein, MD;  Location: [facility] ENDOSCOPY;  Service: Cardiovascular;  Laterality: N/A;  . VAGINAL  HYSTERECTOMY  1975     Home Meds: Prior to Admission medications   Medication Sig Start Date End Date Taking? Authorizing Provider  amiodarone (PACERONE) 100 MG tablet Take 1 tablet (100 mg total) by mouth daily. Patient taking differently: Take 100 mg by mouth every morning.  10/17/15  Yes Mihai Croitoru, MD  carvedilol (COREG) 6.25 MG tablet Take 1 tablet (6.25 mg total) by mouth 2 (two) times daily with a meal. 12/09/15  Yes Theodis Blaze, MD  cholecalciferol (VITAMIN D) 1000 UNITS tablet Take 1,000 Units by mouth at bedtime.    Yes Historical Provider, MD  citalopram (CELEXA) 20 MG tablet Take 0.5 tablets (10 mg total) by  mouth daily. Patient taking differently: Take 10 mg by mouth every morning.  12/09/15  Yes Theodis Blaze, MD  clopidogrel (PLAVIX) 75 MG tablet TAKE 1 TABLET (75 MG TOTAL) BY MOUTH DAILY. Patient taking differently: Take 75 mg by mouth every morning.  12/02/15  Yes Mihai Croitoru, MD  Coenzyme Q10 (COQ10 PO) Take 120 mg by mouth at bedtime.    Yes Historical Provider, MD  hydrochlorothiazide (HYDRODIURIL) 25 MG tablet Take 25 mg by mouth once.   Yes Historical Provider, MD  lisinopril (PRINIVIL,ZESTRIL) 30 MG tablet Take 1 tablet (30 mg total) by mouth daily. Patient taking differently: Take 30 mg by mouth every morning.  12/09/15  Yes Theodis Blaze, MD  Multiple Vitamin (MULTIVITAMIN) capsule Take 1 capsule by mouth 2 (two) times daily.    Yes Historical Provider, MD  nitroGLYCERIN (NITROSTAT) 0.4 MG SL tablet Place 1 tablet (0.4 mg total) under the tongue every 5 (five) minutes x 3 doses as needed for chest pain. 05/15/15  Yes Brittainy Erie Noe, PA-C  rivaroxaban (XARELTO) 15 MG TABS tablet Take 1 tablet (15 mg total) by mouth daily with supper. 11/04/15  Yes Mihai Croitoru, MD    Inpatient Medications:  . amiodarone  100 mg Oral Daily  . carvedilol  6.25 mg Oral BID WC  . citalopram  10 mg Oral Daily  . [START ON 12/18/2015] Influenza vac split quadrivalent PF  0.5 mL Intramuscular Tomorrow-1000  . lisinopril  30 mg Oral Daily  . pantoprazole (PROTONIX) IVPB  80 mg Intravenous Once  . [START ON 12/20/2015] pantoprazole  40 mg Intravenous Q12H   . pantoprozole (PROTONIX) infusion      Allergies:  Allergies  Allergen Reactions  . Brilinta [Ticagrelor] Shortness Of Breath  . Clonidine Derivatives Other (See Comments)    Lowers heart rate  . Atorvastatin Other (See Comments)    Possible cause of fatigue/malaise  . Crestor [Rosuvastatin Calcium] Other (See Comments)    Joint/muscle aches  . Spironolactone     Contraindicated with history of hyperkalemia  . Exforge [Amlodipine  Besylate-Valsartan] Itching and Rash    Social History   Social History  . Marital status: Widowed    Spouse name: N/A  . Number of children: 3  . Years of education: N/A   Occupational History  . Retired Chemical engineer Retired   Social History Main Topics  . Smoking status: Former Smoker    Packs/day: 0.75    Years: 10.00    Types: Cigarettes  . Smokeless tobacco: Never Used     Comment: 01/21/2012 "quit smoking ~ 2002"  . Alcohol use Yes     Comment: 01/21/2012 "3 times/wk I have a couple mixed drinks"; 06/28/2015 "nothing in the last 3 weeks cause I haven't felt good", reduced recently  . Drug  use: No  . Sexual activity: Not Currently   Other Topics Concern  . Not on file   Social History Narrative   Lives in [place].  Retired.     Family History  Problem Relation Age of Onset  . Heart disease Mother   . Hypertension Mother   . Stroke Mother   . Stroke Father   . Heart disease Father   . Heart disease Brother   . Hypertension Brother     2 brothers  . CVA Brother     2 brothers  . Heart attack Brother     2 brothers     Review of Systems: All other systems reviewed and are otherwise negative except as noted above.  Labs:  Recent Labs  12/17/15 0647  TROPONINI <0.03   Lab Results  Component Value Date   WBC 5.9 12/17/2015   HGB 8.9 (L) 12/17/2015   HCT 26.9 (L) 12/17/2015   MCV 84.1 12/17/2015   PLT 237 12/17/2015    Recent Labs Lab 12/17/15 0647  NA 137  K 3.8  CL 104  CO2 27  BUN 45*  CREATININE 0.91  CALCIUM 10.4*  PROT 5.8*  BILITOT 0.6  ALKPHOS 70  ALT 15  AST 19  GLUCOSE 127*   Lab Results  Component Value Date   CHOL 158 05/12/2015   HDL 64 05/12/2015   LDLCALC 71 05/12/2015   TRIG 114 05/12/2015   No results found for: DDIMER  Radiology/Studies:  Dg Chest 2 View  Result Date: 12/02/2015 CLINICAL DATA:  Chest pain, shortness of breath EXAM: CHEST  2 VIEW COMPARISON:  CT chest 08/26/2015 FINDINGS: There is no  focal parenchymal opacity. There is no pleural effusion or pneumothorax. There is mild stable cardiomegaly. There is a dual lead AICD. There is evidence of prior CABG. The osseous structures are unremarkable. IMPRESSION: No active cardiopulmonary disease. Electronically Signed   By: Kathreen Devoid   On: 12/02/2015 16:00   Ct Head Wo Contrast  Result Date: 12/02/2015 CLINICAL DATA:  Initial evaluation for acute headache. EXAM: CT HEAD WITHOUT CONTRAST TECHNIQUE: Contiguous axial images were obtained from the base of the skull through the vertex without intravenous contrast. COMPARISON:  None. FINDINGS: Brain: Generalized cerebral atrophy with chronic microvascular ischemic disease. No acute intracranial hemorrhage. No evidence for acute large vessel territory infarct. No mass lesion, midline shift or mass effect. No hydrocephalus. No extra-axial fluid collection. Vascular: No hyperdense vessel. Scattered vascular calcifications noted. Skull: Scalp soft tissues within normal limits.  Calvarium intact. Sinuses/Orbits: Globes and orbital soft tissues within normal limits. Patient is status post lens extraction on the left. Paranasal sinuses and mastoid air cells are clear. IMPRESSION: 1. No acute intracranial process. 2. Generalized age-related cerebral atrophy with moderate chronic microvascular ischemic disease. Electronically Signed   By: Jeannine Boga M.D.   On: 12/02/2015 20:50   Dg Chest Port 1 View  Result Date: 12/05/2015 CLINICAL DATA:  Chest pain and shortness of Breath EXAM: PORTABLE CHEST 1 VIEW COMPARISON:  12/02/2015 FINDINGS: Cardiac shadow is stable. Bilateral breast implants are noted. A pacemaker is again seen and stable. Postsurgical changes are again noted. No focal infiltrate or sizable effusion is seen. No bony abnormality is noted. IMPRESSION: No acute abnormality seen. No significant change from the prior exam. Electronically Signed   By: Inez Catalina M.D.   On: 12/05/2015 16:58    Dg Toe 2nd Right  Result Date: 12/17/2015 CLINICAL DATA:  Right second toe pain after  a fall today. Syncopal episode. EXAM: RIGHT SECOND TOE COMPARISON:  None. FINDINGS: Dorsal dislocation of the middle phalanx of the right second toe with respect to the proximal phalanx with tiny bone fragment inferiorly suggesting an avulsion fracture. Soft tissue swelling. IMPRESSION: Dislocation of the proximal interphalangeal joint of the right second toe with tiny avulsion fracture. Electronically Signed   By: Lucienne Capers M.D.   On: 12/17/2015 01:29    Wt Readings from Last 3 Encounters:  12/17/15 135 lb 12.8 oz (61.6 kg)  12/08/15 126 lb 1.7 oz (57.2 kg)  12/03/15 141 lb 3.2 oz (64 kg)    EKG: Atrial paced 77bpm, nonspecific ST-T changes.   Physical Exam: Blood pressure 121/68, pulse 79, temperature 98.6 F (37 C), temperature source Oral, resp. rate 16, height 5\' 4"  (1.626 m), weight 135 lb 12.8 oz (61.6 kg), SpO2 99 %. Body mass index is 23.31 kg/m. General: Well developed, well nourished WF, in no acute distress. Head: Normocephalic, atraumatic, sclera non-icteric, no xanthomas, nares are without discharge.  Neck: Negative for carotid bruits. JVD not elevated. Lungs: Clear bilaterally to auscultation without wheezes, rales, or rhonchi. Breathing is unlabored. Heart: RRR with S1 S2. No murmurs, rubs, or gallops appreciated. Abdomen: Soft, non-tender, non-distended with normoactive bowel sounds. No hepatomegaly. No rebound/guarding. No obvious abdominal masses. Msk:  Strength and tone appear normal for age. Extremities: No clubbing or cyanosis. No edema.  Distal pedal pulses are 2+ and equal bilaterally. Neuro: Alert and oriented X 3. No facial asymmetry. No focal deficit. Moves all extremities spontaneously. Psych:  Responds to questions appropriately with a normal affect.     Assessment and Plan  59F with CAD (s/p CABGx3 and LAA clipping in 12/2014, NSTEMI 05/2015 s/p DES to native  RCA and SVG-dRCA; occluded ramus-SVG was treated medically), paroxysmal atrial fib, paroxysmal atrial flutter, tachybrady syndrome s/p PPM 2013, pre-DM (A1C 6.3 in 09/2015), probable CKD stage II, orthostatic hypotension, PAD s/p prior LE stenting, alcohol abuse, HTN, dyslipidemia, hyperkalemia, hypercalcemia, chronic diastolic CHF, moderate AI, syncope, & multiple medication intolerances who presented to [facility] with a GIB.  1. GIB/ABL anemia - Xarelto and Plavix on hold per primary team. GI now following as well.  2. Syncope - suspect related to blood pressure drop in the setting of volume loss and recent orthostasis. Interrogate device. Ortho to see regarding foot fx.  3. Paroxysmal atrial fib/flutter - CHADSVASC 6. Per notes last week, low burden ever since resuming amiodarone. Will re-interrogate device to see if any new arrhythmias correlating with syncope. She is at high risk of stroke off anticoagulation but this clearly needs to be held given her ABL anemia. Continue amiodarone.  4. CAD s/p CABG 12/2014, PCI 05/2015 - she is now > 6 months out from recent stenting. The original plan was to continue until April 2018 but with significant GIB, will review revised recommendations with MD.   5. CKD stage II - although Cr appears normal, CrCl is 46 (which is why she was on renally dosed Xarelto). Follow.   Signed, Charlie Pitter PA-C 12/17/2015, 9:33 AM Pager: [phone] The patient has been seen in conjunction with Melina Copa, PA-C. All aspects of care have been considered and discussed. The patient has been personally interviewed, examined, and all clinical data has been reviewed.   I agree with the content of this note and the plans as outlined.

## 2015-12-17 NOTE — Progress Notes (Deleted)
Cardiology Office Note   Date:  12/17/2015   ID:  [name], DOB [birth year], MRN [id number]  PCP:  [name], MD  Cardiologist:  [name], PA-C   No chief complaint on file.   History of Present Illness: [name] is a 75 y.o. female with a history of ***  [name] presents for ***   Past Medical History:  Diagnosis Date  . ABDOMINAL PAIN, LOWER 12/31/2009  . ANXIETY 12/31/2009  . Aortic insufficiency   . DEPRESSION 12/31/2009  . [place] DISEASE, CERVICAL 12/31/2009  . [place] DISEASE, LUMBAR 12/31/2009  . DIVERTICULITIS, HX OF 12/31/2009  . GLUCOSE INTOLERANCE 12/31/2009  . HYPERLIPIDEMIA 12/31/2009  . HYPERTENSION 12/31/2009  . MENOPAUSE, EARLY 12/31/2009  . MITRAL REGURGITATION 12/31/2009  . MITRAL VALVE PROLAPSE 12/31/2009  . NSTEMI (non-ST elevated myocardial infarction) ([place]) 05/11/2015  . OSTEOARTHRITIS, HAND 12/31/2009  . Pacemaker 01/21/2012   MDT Adapta dual chamber  . Persistent atrial fibrillation ([place]) 01/22/2012  . PVD (peripheral vascular disease), hx stents to bil SFAs 02/2010 01/22/2012  . Symptomatic sinus bradycardia 01/22/2012  . Tricuspid regurgitation     Past Surgical History:  Procedure Laterality Date  . AUGMENTATION MAMMAPLASTY  1983  . CARDIAC CATHETERIZATION  2005   "negative" (01/21/2012)  . CARDIAC CATHETERIZATION  09/17/1999   Mild CAD involving proximal portion of LAD. Mitral valve prolapse w/ mitral regurg. No evidence of renal artery stenosis. Recommended readjustment of antihypertensive medications.  Marland Kitchen CARDIAC CATHETERIZATION N/A 01/01/2015   Procedure: Left Heart Cath and Coronary Angiography;  Surgeon: Jettie Booze, MD;  Location: [place] [facility];  Service: Cardiovascular;  Laterality: N/A;  . CARDIAC CATHETERIZATION N/A 05/12/2015   Procedure: Left Heart Cath and Coronary Angiography;  Surgeon: Troy Sine, MD;  Location: [place] [facility];  Service: Cardiovascular;  Laterality: N/A;  .  CARDIAC CATHETERIZATION N/A 05/12/2015   Procedure: Coronary Stent Intervention;  Surgeon: Troy Sine, MD;  Location: [place] [facility];  Service: Cardiovascular;  Laterality: N/A;  . CARDIOVERSION N/A 02/01/2015   Procedure: CARDIOVERSION;  Surgeon: Lelon Perla, MD;  Location: [facility] ENDOSCOPY;  Service: Cardiovascular;  Laterality: N/A;  . CARDIOVERSION N/A 08/30/2015   Procedure: CARDIOVERSION;  Surgeon: Sanda Klein, MD;  Location: [place] ENDOSCOPY;  Service: Cardiovascular;  Laterality: N/A;  . CARPAL TUNNEL RELEASE  2008   "right hand/thumb; carpal tunnel repair; got rid of arthritis" (01/21/2012)  . CLIPPING OF ATRIAL APPENDAGE N/A 01/04/2015   Procedure: CLIPPING OF ATRIAL APPENDAGE;  Surgeon: Grace Isaac, MD;  Location: [place];  Service: Open Heart Surgery;  Laterality: N/A;  . CORONARY ARTERY BYPASS GRAFT N/A 01/04/2015   Procedure: CORONARY ARTERY BYPASS GRAFTING (CABG) x 3 using left internal mammory artery and greater saphenous vein right leg harvested endoscopically.;  Surgeon: Grace Isaac, MD;  LIMA-LAD, SVG-RI, SVG-PDA  . FACELIFT, LOWER 2/3  1995   "mini" (01/21/2012)  . Lower Arterial Examination  10/28/2011   R. SFA stent mild-moderate mixed density plaque with elevated velocities consistent with 50% diameter reduction. L. SFA stent moderate mixed denisty plaque at mid to distal level consistent with 50-69% diameter reduction.  . OOPHORECTOMY  ~1979  . PARTIAL COLECTOMY  2010  . PERIPHERAL ARTERIAL STENT GRAFT  2012; 2012   "LLE; RLE" (01/21/2012)  . PERMANENT PACEMAKER INSERTION N/A 01/21/2012   Medtronic Adapta L implanted by Dr Sallyanne Kuster for tachy/brady syndrome  . Persantine Myoview Stress Test  02/25/2010  No scintigraphic evidence of inducible myocardial ischemia. No persantine EKG changes. Non-diagnositc for ishcemia. Low-risk, normal scan.  Marland Kitchen POSTERIOR CERVICAL LAMINECTOMY  1985  . TEE WITHOUT CARDIOVERSION N/A 01/04/2015   Procedure: TRANSESOPHAGEAL  ECHOCARDIOGRAM (TEE);  Surgeon: Grace Isaac, MD;  Location: [place];  Service: Open Heart Surgery;  Laterality: N/A;  . TEE WITHOUT CARDIOVERSION N/A 02/01/2015   Procedure: TRANSESOPHAGEAL ECHOCARDIOGRAM (TEE);  Surgeon: Lelon Perla, MD;  Location: [facility] ENDOSCOPY;  Service: Cardiovascular;  Laterality: N/A;  . TEE WITHOUT CARDIOVERSION N/A 08/30/2015   Procedure: TRANSESOPHAGEAL ECHOCARDIOGRAM (TEE);  Surgeon: Sanda Klein, MD;  Location: [facility] ENDOSCOPY;  Service: Cardiovascular;  Laterality: N/A;  . [place]    No current facility-administered medications for this visit.    No current outpatient prescriptions on file.   Facility-Administered Medications Ordered in Other Visits  Medication Dose Route Frequency Provider Last Rate Last Dose  . acetaminophen (TYLENOL) tablet 650 mg  650 mg Oral Q6H PRN Rise Patience, MD       Or  . acetaminophen (TYLENOL) suppository 650 mg  650 mg Rectal Q6H PRN Rise Patience, MD      . acetaminophen (TYLENOL) tablet 650 mg  650 mg Oral Q6H PRN Rise Patience, MD   650 mg at 12/17/15 0510  . amiodarone (PACERONE) tablet 100 mg  100 mg Oral Daily Rise Patience, MD      . carvedilol (COREG) tablet 6.25 mg  6.25 mg Oral BID WC Rise Patience, MD      . citalopram (CELEXA) tablet 10 mg  10 mg Oral Daily Rise Patience, MD      . Derrill Memo ON 12/18/2015] Influenza vac split quadrivalent PF (FLUARIX) injection 0.5 mL  0.5 mL Intramuscular Tomorrow-1000 Rise Patience, MD      . lisinopril (PRINIVIL,ZESTRIL) tablet 30 mg  30 mg Oral Daily Rise Patience, MD      . nitroGLYCERIN (NITROSTAT) SL tablet 0.4 mg  0.4 mg Sublingual Q5 Min x 3 PRN Rise Patience, MD      . ondansetron [facility]) tablet 4 mg  4 mg Oral Q6H PRN Rise Patience, MD       Or  . ondansetron (ZOFRAN) injection 4 mg  4 mg Intravenous Q6H PRN Rise Patience, MD      . pantoprazole (PROTONIX) 80 mg in sodium chloride 0.9 %  100 mL IVPB  80 mg Intravenous Once Rise Patience, MD      . pantoprazole (PROTONIX) 80 mg in sodium chloride 0.9 % 250 mL (0.32 mg/mL) infusion  8 mg/hr Intravenous Continuous Rise Patience, MD      . Derrill Memo ON 12/20/2015] pantoprazole (PROTONIX) injection 40 mg  40 mg Intravenous Q12H Rise Patience, MD        Allergies:   Brilinta [ticagrelor]; Clonidine derivatives; Atorvastatin; Crestor [rosuvastatin calcium]; and Exforge [amlodipine besylate-valsartan]    Social History:  The patient  reports that she has quit smoking. Her smoking use included Cigarettes. She has a 7.50 pack-year smoking history. She has never used smokeless tobacco. She reports that she drinks alcohol. She reports that she does not use drugs.   Family History:  The patient's family history includes CVA in her brother; Heart attack in her brother; Heart disease in her brother, father, and mother; Hypertension in her brother and mother; Stroke in her father and mother.    ROS:  Please see the history of present illness. All  other systems are reviewed and negative.    PHYSICAL EXAM: VS:  There were no vitals taken for this visit. , BMI There is no height or weight on file to calculate BMI. GEN: Well nourished, well developed, female in no acute distress HEENT: normal for age  Neck: no JVD, no carotid bruit, no masses Cardiac: RRR; no murmur, no rubs, or gallops Respiratory:  clear to auscultation bilaterally, normal work of breathing GI: soft, nontender, nondistended, + BS MS: no deformity or atrophy; no edema; distal pulses are 2+ in all 4 extremities  Skin: warm and dry, no rash Neuro:  Strength and sensation are intact Psych: euthymic mood, full affect   EKG:  EKG {ACTION; IS/IS [id number] ordered today. The ekg ordered today demonstrates ***   Recent Labs: 08/26/2015: Magnesium 2.1 09/16/2015: TSH 5.10 12/02/2015: B Natriuretic Peptide 499.0 12/17/2015: ALT 15; BUN 45; Creatinine, Ser  0.91; Hemoglobin 8.9; Platelets 237; Potassium 3.8; Sodium 137    Lipid Panel    Component Value Date/Time   CHOL 158 05/12/2015 0324   TRIG 114 05/12/2015 0324   HDL 64 05/12/2015 0324   CHOLHDL 2.5 05/12/2015 0324   VLDL 23 05/12/2015 0324   LDLCALC 71 05/12/2015 0324     Wt Readings from Last 3 Encounters:  12/17/15 135 lb 12.8 oz (61.6 kg)  12/08/15 126 lb 1.7 oz (57.2 kg)  12/03/15 141 lb 3.2 oz (64 kg)     Other studies Reviewed: Additional studies/ records that were reviewed today include: ***.  ASSESSMENT AND PLAN:  1.  ***   Current medicines are reviewed at length with the patient today.  The patient {ACTIONS; HAS/DOES NOT HAVE:19233} concerns regarding medicines.  The following changes have been made:  {PLAN; NO CHANGE:13088:s}  Labs/ tests ordered today include: *** No orders of the defined types were placed in this encounter.    Disposition:   FU with ***  Signed, [name], PA-C  12/17/2015 8:12 AM    [facility] Phone: [phone]; Fax: [phone]  This note was written with the assistance of speech recognition software. Please excuse any transcriptional errors.

## 2015-12-17 NOTE — Progress Notes (Signed)
Resumed care of patient. Orders reviewed. Will continue to monitor.

## 2015-12-17 NOTE — H&P (Signed)
History and Physical    [name] [id number] DOB: September 05, 1940 DOA: 12/16/2015  PCP: [name], MD  Patient coming from: Home.  Chief Complaint: Loss of consciousness.  HPI: [name] is a 75 y.o. female with history of CAD status post CABG and stents placement recently in March 2017, hypertension, paroxysmal atrial fibrillation presents to the ER because of loss of consciousness. Patient was recently admitted twice for uncontrolled blood pressure and syncopal episode was brought to the ER after patient had syncopal episode last night around 9 PM while walking to the bathroom. Patient states her granddaughter helped her to the floor but she did loose consciousness briefly. Did not have any incontinence of bowel or urine. Denies any chest pain or shortness of breath. Since last discharge patient has been very careful in getting out of the bed due to dizziness. Patient accidentally took hydrochlorothiazide yesterday. In the ER patient was found to have a hemoglobin of 9 which is 3 g drop from previous and patient states she also noticed black stools last 2 days. Stool for occult blood is positive.  Patient states while patient was having the syncopal episode she may have caught her leg into the cabinet and patient has pain in the right foot which x-rays show has dislocated right second toe. ER physician was unable to fix it.   ED Course: See history of presenting illness.  Review of Systems: As per HPI, rest all negative.   Past Medical History:  Diagnosis Date  . ABDOMINAL PAIN, LOWER 12/31/2009  . ANXIETY 12/31/2009  . Aortic insufficiency   . DEPRESSION 12/31/2009  . [place] DISEASE, CERVICAL 12/31/2009  . [place] DISEASE, LUMBAR 12/31/2009  . DIVERTICULITIS, HX OF 12/31/2009  . GLUCOSE INTOLERANCE 12/31/2009  . HYPERLIPIDEMIA 12/31/2009  . HYPERTENSION 12/31/2009  . MENOPAUSE, EARLY 12/31/2009  . MITRAL REGURGITATION 12/31/2009  . MITRAL VALVE PROLAPSE 12/31/2009  .  NSTEMI (non-ST elevated myocardial infarction) ([place]) 05/11/2015  . OSTEOARTHRITIS, HAND 12/31/2009  . Pacemaker 01/21/2012   MDT Adapta dual chamber  . Persistent atrial fibrillation ([place]) 01/22/2012  . PVD (peripheral vascular disease), hx stents to bil SFAs 02/2010 01/22/2012  . Symptomatic sinus bradycardia 01/22/2012  . Tricuspid regurgitation     Past Surgical History:  Procedure Laterality Date  . AUGMENTATION MAMMAPLASTY  1983  . CARDIAC CATHETERIZATION  2005   "negative" (01/21/2012)  . CARDIAC CATHETERIZATION  09/17/1999   Mild CAD involving proximal portion of LAD. Mitral valve prolapse w/ mitral regurg. No evidence of renal artery stenosis. Recommended readjustment of antihypertensive medications.  Marland Kitchen CARDIAC CATHETERIZATION N/A 01/01/2015   Procedure: Left Heart Cath and Coronary Angiography;  Surgeon: Jettie Booze, MD;  Location: [place] [facility];  Service: Cardiovascular;  Laterality: N/A;  . CARDIAC CATHETERIZATION N/A 05/12/2015   Procedure: Left Heart Cath and Coronary Angiography;  Surgeon: Troy Sine, MD;  Location: [place] [facility];  Service: Cardiovascular;  Laterality: N/A;  . CARDIAC CATHETERIZATION N/A 05/12/2015   Procedure: Coronary Stent Intervention;  Surgeon: Troy Sine, MD;  Location: [place] [facility];  Service: Cardiovascular;  Laterality: N/A;  . CARDIOVERSION N/A 02/01/2015   Procedure: CARDIOVERSION;  Surgeon: Lelon Perla, MD;  Location: [facility] ENDOSCOPY;  Service: Cardiovascular;  Laterality: N/A;  . CARDIOVERSION N/A 08/30/2015   Procedure: CARDIOVERSION;  Surgeon: Sanda Klein, MD;  Location: [place] ENDOSCOPY;  Service: Cardiovascular;  Laterality: N/A;  . CARPAL TUNNEL RELEASE  2008   "right hand/thumb; carpal tunnel repair; got rid  of arthritis" (01/21/2012)  . CLIPPING OF ATRIAL APPENDAGE N/A 01/04/2015   Procedure: CLIPPING OF ATRIAL APPENDAGE;  Surgeon: Grace Isaac, MD;  Location: [place];  Service: Open Heart Surgery;  Laterality:  N/A;  . CORONARY ARTERY BYPASS GRAFT N/A 01/04/2015   Procedure: CORONARY ARTERY BYPASS GRAFTING (CABG) x 3 using left internal mammory artery and greater saphenous vein right leg harvested endoscopically.;  Surgeon: Grace Isaac, MD;  LIMA-LAD, SVG-RI, SVG-PDA  . FACELIFT, LOWER 2/3  1995   "mini" (01/21/2012)  . Lower Arterial Examination  10/28/2011   R. SFA stent mild-moderate mixed density plaque with elevated velocities consistent with 50% diameter reduction. L. SFA stent moderate mixed denisty plaque at mid to distal level consistent with 50-69% diameter reduction.  . OOPHORECTOMY  ~1979  . PARTIAL COLECTOMY  2010  . PERIPHERAL ARTERIAL STENT GRAFT  2012; 2012   "LLE; RLE" (01/21/2012)  . PERMANENT PACEMAKER INSERTION N/A 01/21/2012   Medtronic Adapta L implanted by Dr Sallyanne Kuster for tachy/brady syndrome  . Persantine Myoview Stress Test  02/25/2010   No scintigraphic evidence of inducible myocardial ischemia. No persantine EKG changes. Non-diagnositc for ishcemia. Low-risk, normal scan.  Marland Kitchen POSTERIOR CERVICAL LAMINECTOMY  1985  . TEE WITHOUT CARDIOVERSION N/A 01/04/2015   Procedure: TRANSESOPHAGEAL ECHOCARDIOGRAM (TEE);  Surgeon: Grace Isaac, MD;  Location: [place];  Service: Open Heart Surgery;  Laterality: N/A;  . TEE WITHOUT CARDIOVERSION N/A 02/01/2015   Procedure: TRANSESOPHAGEAL ECHOCARDIOGRAM (TEE);  Surgeon: Lelon Perla, MD;  Location: [facility] ENDOSCOPY;  Service: Cardiovascular;  Laterality: N/A;  . TEE WITHOUT CARDIOVERSION N/A 08/30/2015   Procedure: TRANSESOPHAGEAL ECHOCARDIOGRAM (TEE);  Surgeon: Sanda Klein, MD;  Location[facility] ENDOSCOPY;  Service: Cardiovascular;  Laterality: N/A;  . [place]     reports that she has quit smoking. Her smoking use included Cigarettes. She has a 7.50 pack-year smoking history. She has never used smokeless tobacco. She reports that she drinks alcohol. She reports that she does not use drugs.  Allergies  Allergen  Reactions  . Brilinta [Ticagrelor] Shortness Of Breath  . Clonidine Derivatives Other (See Comments)    Lowers heart rate  . Atorvastatin Other (See Comments)    Possible cause of fatigue/malaise  . Crestor [Rosuvastatin Calcium] Other (See Comments)    Joint/muscle aches  . Exforge [Amlodipine Besylate-Valsartan] Itching and Rash    Family History  Problem Relation Age of Onset  . Heart disease Mother   . Hypertension Mother   . Stroke Mother   . Stroke Father   . Heart disease Father   . Heart disease Brother   . Hypertension Brother     2 brothers  . CVA Brother     2 brothers  . Heart attack Brother     2 brothers    Prior to Admission medications   Medication Sig Start Date End Date Taking? Authorizing Provider  amiodarone (PACERONE) 100 MG tablet Take 1 tablet (100 mg total) by mouth daily. Patient taking differently: Take 100 mg by mouth every morning.  10/17/15  Yes Mihai Croitoru, MD  carvedilol (COREG) 6.25 MG tablet Take 1 tablet (6.25 mg total) by mouth 2 (two) times daily with a meal. 12/09/15  Yes Theodis Blaze, MD  cholecalciferol (VITAMIN D) 1000 UNITS tablet Take 1,000 Units by mouth at bedtime.    Yes Historical Provider, MD  citalopram (CELEXA) 20 MG tablet Take 0.5 tablets (10 mg total) by mouth daily. Patient taking differently: Take 10 mg by  mouth every morning.  12/09/15  Yes Theodis Blaze, MD  clopidogrel (PLAVIX) 75 MG tablet TAKE 1 TABLET (75 MG TOTAL) BY MOUTH DAILY. Patient taking differently: Take 75 mg by mouth every morning.  12/02/15  Yes Mihai Croitoru, MD  Coenzyme Q10 (COQ10 PO) Take 120 mg by mouth at bedtime.    Yes Historical Provider, MD  hydrochlorothiazide (HYDRODIURIL) 25 MG tablet Take 25 mg by mouth once.   Yes Historical Provider, MD  lisinopril (PRINIVIL,ZESTRIL) 30 MG tablet Take 1 tablet (30 mg total) by mouth daily. Patient taking differently: Take 30 mg by mouth every morning.  12/09/15  Yes Theodis Blaze, MD  Multiple Vitamin  (MULTIVITAMIN) capsule Take 1 capsule by mouth 2 (two) times daily.    Yes Historical Provider, MD  nitroGLYCERIN (NITROSTAT) 0.4 MG SL tablet Place 1 tablet (0.4 mg total) under the tongue every 5 (five) minutes x 3 doses as needed for chest pain. 05/15/15  Yes Brittainy Erie Noe, PA-C  rivaroxaban (XARELTO) 15 MG TABS tablet Take 1 tablet (15 mg total) by mouth daily with supper. 11/04/15  Yes Mihai Croitoru, MD    Physical Exam: Vitals:   12/16/15 2306 12/17/15 0155 12/17/15 0248 12/17/15 0500  BP: 133/56 137/61 (!) 148/61 121/68  Pulse: 78 75 79 79  Resp: 18 14 14 16   Temp: 98 F (36.7 C) 97.4 F (36.3 C) 98.4 F (36.9 C) 98.6 F (37 C)  TempSrc: Oral Oral Oral Oral  SpO2: 100% 100% 100% 99%  Weight:   61.6 kg (135 lb 12.8 oz)   Height:   5\' 4"  (1.626 m)       Constitutional: Moderately built and nourished. Vitals:   12/16/15 2306 12/17/15 0155 12/17/15 0248 12/17/15 0500  BP: 133/56 137/61 (!) 148/61 121/68  Pulse: 78 75 79 79  Resp: 18 14 14 16   Temp: 98 F (36.7 C) 97.4 F (36.3 C) 98.4 F (36.9 C) 98.6 F (37 C)  TempSrc: Oral Oral Oral Oral  SpO2: 100% 100% 100% 99%  Weight:   61.6 kg (135 lb 12.8 oz)   Height:   5\' 4"  (1.626 m)    Eyes: Anicteric pallor. ENMT: No discharge from the ears eyes nose or mouth. Neck: No mass felt. No neck rigidity. Respiratory: No rhonchi or crepitations. Cardiovascular: S1 and S2 heard. No murmurs appreciated. Abdomen: Soft nontender bowel sounds present. No guarding or rigidity. Musculoskeletal: Right foot pain. No edema. Skin: No rash. Skin appears warm. Neurologic: Alert awake oriented to time place and person. Moves all extremities. Psychiatric: Appears normal. Normal affect.   Labs on Admission: I have personally reviewed following labs and imaging studies  CBC:  Recent Labs Lab 12/16/15 2343  WBC 7.7  HGB 9.9*  HCT 29.2*  MCV 82.3  PLT 123456   Basic Metabolic Panel:  Recent Labs Lab 12/16/15 2343  NA 135    K 3.7  CL 104  CO2 27  GLUCOSE 147*  BUN 51*  CREATININE 1.00  CALCIUM 11.0*   GFR: Estimated Creatinine Clearance: 42 mL/min (by C-G formula based on SCr of 1 mg/dL). Liver Function Tests: No results for input(s): AST, ALT, ALKPHOS, BILITOT, PROT, ALBUMIN in the last 168 hours. No results for input(s): LIPASE, AMYLASE in the last 168 hours. No results for input(s): AMMONIA in the last 168 hours. Coagulation Profile: No results for input(s): INR, PROTIME in the last 168 hours. Cardiac Enzymes: No results for input(s): CKTOTAL, CKMB, CKMBINDEX, TROPONINI in the last 168  hours. BNP (last 3 results) No results for input(s): PROBNP in the last 8760 hours. HbA1C: No results for input(s): HGBA1C in the last 72 hours. CBG:  Recent Labs Lab 12/16/15 2319  GLUCAP 123*   Lipid Profile: No results for input(s): CHOL, HDL, LDLCALC, TRIG, CHOLHDL, LDLDIRECT in the last 72 hours. Thyroid Function Tests: No results for input(s): TSH, T4TOTAL, FREET4, T3FREE, THYROIDAB in the last 72 hours. Anemia Panel: No results for input(s): VITAMINB12, FOLATE, FERRITIN, TIBC, IRON, RETICCTPCT in the last 72 hours. Urine analysis:    Component Value Date/Time   COLORURINE YELLOW 12/17/2015 0348   APPEARANCEUR CLEAR 12/17/2015 0348   LABSPEC 1.018 12/17/2015 0348   PHURINE 6.0 12/17/2015 0348   GLUCOSEU NEGATIVE 12/17/2015 0348   GLUCOSEU NEGATIVE 12/31/2009 1612   HGBUR NEGATIVE 12/17/2015 0348   BILIRUBINUR NEGATIVE 12/17/2015 0348   KETONESUR NEGATIVE 12/17/2015 0348   PROTEINUR NEGATIVE 12/17/2015 0348   UROBILINOGEN 1.0 06/03/2012 1610   NITRITE NEGATIVE 12/17/2015 0348   LEUKOCYTESUR TRACE (A) 12/17/2015 0348   Sepsis Labs: @LABRCNTIP (procalcitonin:4,lacticidven:4) )No results found for this or any previous visit (from the past 240 hour(s)).   Radiological Exams on Admission: Dg Toe 2nd Right  Result Date: 12/17/2015 CLINICAL DATA:  Right second toe pain after a fall today.  Syncopal episode. EXAM: RIGHT SECOND TOE COMPARISON:  None. FINDINGS: Dorsal dislocation of the middle phalanx of the right second toe with respect to the proximal phalanx with tiny bone fragment inferiorly suggesting an avulsion fracture. Soft tissue swelling. IMPRESSION: Dislocation of the proximal interphalangeal joint of the right second toe with tiny avulsion fracture. Electronically Signed   By: Lucienne Capers M.D.   On: 12/17/2015 01:29    EKG: Independently reviewed. Paced rhythm.  Assessment/Plan Principal Problem:   Acute GI bleeding Active Problems:   PAF (paroxysmal atrial fibrillation) (HCC)   S/P placement of cardiac pacemaker, medtronic adapta 01/21/12   PVD (peripheral vascular disease), hx stents to bil SFAs 02/2010   S/P CABG x 3 01/04/15   Febrile illness, acute   Syncope   Acute blood loss anemia    1. Acute GI bleed - likely upper GI bleed given the melanotic stool. Denies taking any NSAIDs. I'm holding off patient xarelto Plavix for now. Follow CBC. If hemoglobin falls less than 8 (has stents) or patient becomes hypotensive transfuse. Consult gastroenterologist in a.m. Patient is on Protonix for possible upper GI bleed. 2. Right foot second toe dislocation - please consult orthopedics in a.m. 3. CAD status post CABG and recent stents placed in March - I have consulted cardiology for further opinion since patient has GI bleed and recent stents and is on Plavix which is held. 4. Atrial fibrillation and pacemaker placement - xarelto on hold due to GI bleed. Cardiology consult. 5. Hypertension difficult to control - will continue present home medications. 6. Acute blood loss anemia - follow CBC. 7. Hypercalcemia - could be due to dehydration and HCTZ. Follow metabolic panel. If it persists check SPEP.   DVT prophylaxis: SCDs. Code Status: DO NOT INTUBATE.  Family Communication: Discussed with patient.  Disposition Plan: Home.  Consults called: Cardiology.  Admission  status: Inpatient. Likely stay 2 days.    Rise Patience MD [facility] Pager [phone].  If 7PM-7AM, please contact night-coverage [email or web address] Password [facility]  12/17/2015, 6:25 AM

## 2015-12-17 NOTE — Telephone Encounter (Signed)
Returned call to patient.She stated she passed out last night, her granddaughter called 911.She was admitted to [facility] hospital.Stated her hgb is low and she will be having test today to see what is causing.Stated she has appointment with Dr.Croitoru this morning that she needs to cancel.Advised I will cancel appointment and let Dr.Croitoru know you called.

## 2015-12-17 NOTE — Consult Note (Signed)
Consultation  Referring Provider:  Dr. Jearld Shines    Primary Care Physician:  [name], MD Primary Gastroenterologist:  Althia Forts     Reason for Consultation: Melena, Symptomatic anemia           HPI:   [name] is a 75 y.o. female with past medical history of CAD status post CABG and stent placement in March 2017 maintained on Plavix and Xarelto, hypertension, paroxysmal A. fib, PVD, aortic insufficiency and tricuspid regurgitation and presented to the ER due to loss of consciousness this morning. Per chart review patient has been recently admitted twice for uncontrolled blood pressure and syncopal episodes.   Today, the patient describes that she had a syncopal episode last night around 9 PM when walking to the bathroom. Apparently her granddaughter helped her to the floor but she did lose consciousness briefly. She was not incontinent of bowel or urine. The patient tells me she believes this is because they are "still working on getting medication straight". She does admit that yesterday she found a bottle of hydrochlorothiazide and thought she was supposed to be taking this, so she took her first tell yesterday even though she was already feeling very "weak". The patient does describe setting at the breakfast table in barely being able to hold her head up to eat. She tells me that since leaving the hospital last on 12/09/15 she has been dealing with constipation and tells me that even while in the hospital she was noticing some "black sticky" stool. Patient tells me she is on a vitamin which has some iron in it, but denies any use of Pepto-Bismol. This is a change for her and she always had regular bowel movements before then. The patient thought that possibly her black stool was just due to all of her different medications and thought nothing of it. She did take her Xarelto last night and Plavix initially morning. Associated was doing included decrease in appetite over the past month  or so.   Patient denies fever, chills, abdominal pain, heartburn, reflux, nausea, vomiting, shortness of breath or use of NSAIDs.  ER course: Patient found to have a 3 g drop in hemoglobin, currently at 8.9 from 12.3 10 days ago. Stool occult positive  Previous GI history: Per patient report colo the past  "a long time ago"-unsure of Doctor/Clinic 10/20/2007-segmental resection of the sigmoid colon due to what appears to be diverticular disease? (Path in results review)  Past Medical History:  Diagnosis Date  . ANXIETY 12/31/2009  . Aortic insufficiency   . CAD (coronary artery disease)    a. s/p CABGx3 and LAA clipping in 12/2014, NSTEMI 05/2015 s/p DES to native RCA and SVG-dRCA; occluded ramus-SVG was treated medically). c. neg nuc 10/2015 at [facility].  Marland Kitchen DEPRESSION 12/31/2009  . [place] DISEASE, CERVICAL 12/31/2009  . [place] DISEASE, LUMBAR 12/31/2009  . DIVERTICULITIS, HX OF 12/31/2009  . Essential hypertension   . GLUCOSE INTOLERANCE 12/31/2009  . Hyperlipidemia 12/31/2009  . MENOPAUSE, EARLY 12/31/2009  . NSTEMI (non-ST elevated myocardial infarction) ([place]) 05/11/2015  . OSTEOARTHRITIS, HAND   . Pacemaker 01/21/2012   MDT Adapta dual chamber  . Paroxysmal atrial flutter ([place])   . Persistent atrial fibrillation ([place])   . PVD (peripheral vascular disease), hx stents to bil SFAs 02/2010   . Symptomatic sinus bradycardia 01/22/2012  . Tachy-brady syndrome ([place])    a. s/p PPM 2013.  . Tricuspid regurgitation     Past Surgical History:  Procedure Laterality Date  .  AUGMENTATION MAMMAPLASTY  1983  . CARDIAC CATHETERIZATION N/A 01/01/2015   Procedure: Left Heart Cath and Coronary Angiography;  Surgeon: Jettie Booze, MD;  Location: [place] [facility];  Service: Cardiovascular;  Laterality: N/A;  . CARDIAC CATHETERIZATION N/A 05/12/2015   Procedure: Left Heart Cath and Coronary Angiography;  Surgeon: Troy Sine, MD;  Location: [place] [facility];  Service: Cardiovascular;   Laterality: N/A;  . CARDIAC CATHETERIZATION N/A 05/12/2015   Procedure: Coronary Stent Intervention;  Surgeon: Troy Sine, MD;  Location: [place] [facility];  Service: Cardiovascular;  Laterality: N/A;  . CARDIOVERSION N/A 02/01/2015   Procedure: CARDIOVERSION;  Surgeon: Lelon Perla, MD;  Location: [facility] ENDOSCOPY;  Service: Cardiovascular;  Laterality: N/A;  . CARDIOVERSION N/A 08/30/2015   Procedure: CARDIOVERSION;  Surgeon: Sanda Klein, MD;  Location: [place] ENDOSCOPY;  Service: Cardiovascular;  Laterality: N/A;  . CARPAL TUNNEL RELEASE  2008   "right hand/thumb; carpal tunnel repair; got rid of arthritis" (01/21/2012)  . CLIPPING OF ATRIAL APPENDAGE N/A 01/04/2015   Procedure: CLIPPING OF ATRIAL APPENDAGE;  Surgeon: Grace Isaac, MD;  Location: [place];  Service: Open Heart Surgery;  Laterality: N/A;  . CORONARY ARTERY BYPASS GRAFT N/A 01/04/2015   Procedure: CORONARY ARTERY BYPASS GRAFTING (CABG) x 3 using left internal mammory artery and greater saphenous vein right leg harvested endoscopically.;  Surgeon: Grace Isaac, MD;  LIMA-LAD, SVG-RI, SVG-PDA  . FACELIFT, LOWER 2/3  1995   "mini" (01/21/2012)  . Lower Arterial Examination  10/28/2011   R. SFA stent mild-moderate mixed density plaque with elevated velocities consistent with 50% diameter reduction. L. SFA stent moderate mixed denisty plaque at mid to distal level consistent with 50-69% diameter reduction.  . OOPHORECTOMY  ~1979  . PARTIAL COLECTOMY  2010  . PERIPHERAL ARTERIAL STENT GRAFT  2012; 2012   "LLE; RLE" (01/21/2012)  . PERMANENT PACEMAKER INSERTION N/A 01/21/2012   Medtronic Adapta L implanted by Dr Sallyanne Kuster for tachy/brady syndrome  . POSTERIOR CERVICAL LAMINECTOMY  1985  . TEE WITHOUT CARDIOVERSION N/A 01/04/2015   Procedure: TRANSESOPHAGEAL ECHOCARDIOGRAM (TEE);  Surgeon: Grace Isaac, MD;  Location: [place];  Service: Open Heart Surgery;  Laterality: N/A;  . TEE WITHOUT CARDIOVERSION N/A 02/01/2015    Procedure: TRANSESOPHAGEAL ECHOCARDIOGRAM (TEE);  Surgeon: Lelon Perla, MD;  Location: [facility] ENDOSCOPY;  Service: Cardiovascular;  Laterality: N/A;  . TEE WITHOUT CARDIOVERSION N/A 08/30/2015   Procedure: TRANSESOPHAGEAL ECHOCARDIOGRAM (TEE);  Surgeon: Sanda Klein, MD;  Location: [facility] ENDOSCOPY;  Service: Cardiovascular;  Laterality: N/A;  . VAGINAL HYSTERECTOMY  1975    Family History  Problem Relation Age of Onset  . Heart disease Mother   . Hypertension Mother   . Stroke Mother   . Stroke Father   . Heart disease Father   . Heart disease Brother   . Hypertension Brother     2 brothers  . CVA Brother     2 brothers  . Heart attack Brother     2 brothers   Social History  Substance Use Topics  . Smoking status: Former Smoker    Packs/day: 0.75    Years: 10.00    Types: Cigarettes  . Smokeless tobacco: Never Used     Comment: 01/21/2012 "quit smoking ~ 2002"  . Alcohol use Yes     Comment: 01/21/2012 "3 times/wk I have a couple mixed drinks"; 06/28/2015 "nothing in the last 3 weeks cause I haven't felt good", reduced recently    Prior to Admission  medications   Medication Sig Start Date End Date Taking? Authorizing Provider  amiodarone (PACERONE) 100 MG tablet Take 1 tablet (100 mg total) by mouth daily. Patient taking differently: Take 100 mg by mouth every morning.  10/17/15  Yes Mihai Croitoru, MD  carvedilol (COREG) 6.25 MG tablet Take 1 tablet (6.25 mg total) by mouth 2 (two) times daily with a meal. 12/09/15  Yes Theodis Blaze, MD  cholecalciferol (VITAMIN D) 1000 UNITS tablet Take 1,000 Units by mouth at bedtime.    Yes Historical Provider, MD  citalopram (CELEXA) 20 MG tablet Take 0.5 tablets (10 mg total) by mouth daily. Patient taking differently: Take 10 mg by mouth every morning.  12/09/15  Yes Theodis Blaze, MD  clopidogrel (PLAVIX) 75 MG tablet TAKE 1 TABLET (75 MG TOTAL) BY MOUTH DAILY. Patient taking differently: Take 75 mg by mouth every morning.  12/02/15   Yes Mihai Croitoru, MD  Coenzyme Q10 (COQ10 PO) Take 120 mg by mouth at bedtime.    Yes Historical Provider, MD  hydrochlorothiazide (HYDRODIURIL) 25 MG tablet Take 25 mg by mouth once.   Yes Historical Provider, MD  lisinopril (PRINIVIL,ZESTRIL) 30 MG tablet Take 1 tablet (30 mg total) by mouth daily. Patient taking differently: Take 30 mg by mouth every morning.  12/09/15  Yes Theodis Blaze, MD  Multiple Vitamin (MULTIVITAMIN) capsule Take 1 capsule by mouth 2 (two) times daily.    Yes Historical Provider, MD  nitroGLYCERIN (NITROSTAT) 0.4 MG SL tablet Place 1 tablet (0.4 mg total) under the tongue every 5 (five) minutes x 3 doses as needed for chest pain. 05/15/15  Yes Brittainy Erie Noe, PA-C  rivaroxaban (XARELTO) 15 MG TABS tablet Take 1 tablet (15 mg total) by mouth daily with supper. 11/04/15  Yes Sanda Klein, MD    Current Facility-Administered Medications  Medication Dose Route Frequency Provider Last Rate Last Dose  . acetaminophen (TYLENOL) tablet 650 mg  650 mg Oral Q6H PRN Rise Patience, MD       Or  . acetaminophen (TYLENOL) suppository 650 mg  650 mg Rectal Q6H PRN Rise Patience, MD      . acetaminophen (TYLENOL) tablet 650 mg  650 mg Oral Q6H PRN Rise Patience, MD   650 mg at 12/17/15 0510  . amiodarone (PACERONE) tablet 100 mg  100 mg Oral Daily Rise Patience, MD      . carvedilol (COREG) tablet 6.25 mg  6.25 mg Oral BID WC Rise Patience, MD   6.25 mg at 12/17/15 0823  . citalopram (CELEXA) tablet 10 mg  10 mg Oral Daily Rise Patience, MD      . Derrill Memo ON 12/18/2015] Influenza vac split quadrivalent PF (FLUARIX) injection 0.5 mL  0.5 mL Intramuscular Tomorrow-1000 Rise Patience, MD      . lisinopril (PRINIVIL,ZESTRIL) tablet 30 mg  30 mg Oral Daily Rise Patience, MD      . nitroGLYCERIN (NITROSTAT) SL tablet 0.4 mg  0.4 mg Sublingual Q5 Min x 3 PRN Rise Patience, MD      . ondansetron [facility]) tablet 4 mg  4 mg Oral Q6H PRN  Rise Patience, MD       Or  . ondansetron (ZOFRAN) injection 4 mg  4 mg Intravenous Q6H PRN Rise Patience, MD      . pantoprazole (PROTONIX) 80 mg in sodium chloride 0.9 % 100 mL IVPB  80 mg Intravenous Once Rise Patience, MD      .  pantoprazole (PROTONIX) 80 mg in sodium chloride 0.9 % 250 mL (0.32 mg/mL) infusion  8 mg/hr Intravenous Continuous Rise Patience, MD      . Derrill Memo ON 12/20/2015] pantoprazole (PROTONIX) injection 40 mg  40 mg Intravenous Q12H Rise Patience, MD        Allergies as of 12/16/2015 - Review Complete 12/16/2015  Allergen Reaction Noted  . Brilinta [ticagrelor] Shortness Of Breath 06/28/2015  . Clonidine derivatives Other (See Comments) 10/27/2012  . Atorvastatin Other (See Comments) 06/28/2015  . Crestor [rosuvastatin calcium] Other (See Comments) 09/25/2015  . Exforge [amlodipine besylate-valsartan] Itching and Rash 10/27/2012     Review of Systems:     Constitutional: Positive for weakness No weight loss, fever, chills or fatigue HEENT: Eyes: No change in vision               Ears, Nose, Throat:  No change in hearing or congestion Skin: No rash or itching Cardiovascular: No chest pain, chest pressure or palpitations   Respiratory: No SOB or cough Gastrointestinal: See HPI and otherwise negative Genitourinary: No dysuria or change in urinary frequency Neurological: Positive for dizziness and syncope No headache Musculoskeletal: No new muscle or joint pain Hematologic: No bruising Psychiatric: No history of depression or anxiety    Physical Exam:  Vital signs in last 24 hours: Temp:  [97.4 F (36.3 C)-98.6 F (37 C)] 98.6 F (37 C) (11/07 0500) Pulse Rate:  [75-79] 79 (11/07 0500) Resp:  [14-18] 16 (11/07 0500) BP: (121-148)/(56-68) 121/68 (11/07 0500) SpO2:  [99 %-100 %] 99 % (11/07 0500) Weight:  [135 lb 12.8 oz (61.6 kg)] 135 lb 12.8 oz (61.6 kg) (11/07 0248) Last BM Date: 12/16/15 General:   Pleasant Caucasian  female appears to be in NAD, Well developed, Well nourished, alert and cooperative Head:  Normocephalic and atraumatic. Eyes:   PEERL, EOMI. No icterus. Conjunctiva pink. Ears:  Normal auditory acuity. Neck:  Supple Throat: Oral cavity and pharynx without inflammation, swelling or lesion.  Lungs: Respirations even and unlabored. Lungs clear to auscultation bilaterally.   No wheezes, crackles, or rhonchi.  Heart: Normal S1, S2. No MRG. Regular rate and rhythm. No peripheral edema, cyanosis or pallor.  Abdomen:  Soft, nondistended, nontender. No rebound or guarding. Normal bowel sounds. No appreciable masses or hepatomegaly. Rectal:  Not performed.  Msk:  Symmetrical without gross deformities. Peripheral pulses intact.  Extremities:  Without edema, no deformity or joint abnormality.  Neurologic:  Alert and  oriented x4;  grossly normal neurologically.  Skin:   Dry and intact without significant lesions or rashes. Psychiatric: Oriented to person, place and time. Demonstrates good judgement and reason without abnormal affect or behaviors.   LAB RESULTS:  Recent Labs  12/16/15 2343 12/17/15 0647  WBC 7.7 5.9  HGB 9.9* 8.9*  HCT 29.2* 26.9*  PLT 274 237   BMET  Recent Labs  12/16/15 2343 12/17/15 0647  NA 135 137  K 3.7 3.8  CL 104 104  CO2 27 27  GLUCOSE 147* 127*  BUN 51* 45*  CREATININE 1.00 0.91  CALCIUM 11.0* 10.4*   LFT  Recent Labs  12/17/15 0647  PROT 5.8*  ALBUMIN 3.4*  AST 19  ALT 15  ALKPHOS 70  BILITOT 0.6   PT/INR No results for input(s): LABPROT, INR in the last 72 hours.  STUDIES: Dg Toe 2nd Right  Result Date: 12/17/2015 CLINICAL DATA:  Right second toe pain after a fall today. Syncopal episode. EXAM: RIGHT SECOND TOE COMPARISON:  None. FINDINGS: Dorsal dislocation of the middle phalanx of the right second toe with respect to the proximal phalanx with tiny bone fragment inferiorly suggesting an avulsion fracture. Soft tissue swelling. IMPRESSION:  Dislocation of the proximal interphalangeal joint of the right second toe with tiny avulsion fracture. Electronically Signed   By: Lucienne Capers M.D.   On: 12/17/2015 01:29     PREVIOUS ENDOSCOPIES:            None?   Impression / Plan:   Impression: 1. Upper GI bleed:Patient reports "black sticky" stools since the end of October on a daily basis, episode of syncope yesterday, she blames on her blood pressure medications, no history of upper GI symptoms; consider PUD versus AVM versus gastritis versus other 2. Right foot second toe dislocation: Patient being followed by orthopedics today 3. CAD status post CABG and recent stent placed in March: Maintained on Plavix, held currently, last dose yesterday 8am 4. A. fib and pacemaker placement: Xarelto on hold currently, last dose yesterday 7 pm 5. Acute blood loss anemia: The patient's hemoglobin was 12.3 10 days ago, now 8.9 with 1 g drop overnight  Plan: 1. Continue to monitor hemoglobin every 6 hours with transfusion as necessary less than 7 2. Continue supportive measures 3. Continue BID Protonix 4. Will start patient on clear liquid diet and then nothing by mouth after midnight 5. Will proceed with EGD for further evaluation of possible upper GI bleed. Discussed risks, benefits, limitations and alternatives of patient agrees to proceed. This was scheduled with Dr. Ardis Hughs tomorrow afternoon. 6. Continue to hold anticoagulation 7. Please await any further recommendations from Dr. Ardis Hughs.  Thank you for your kind consultation, we will continue to follow.  [name]  12/17/2015, 9:22 AM Pager #: [phone]   ________________________________________________________________________  Velora Heckler GI MD note:  I personally examined the patient, reviewed the data and agree with the assessment and plan described above. 75 yo woman with severe heart disease on plavix and xarelto.  Has had melenic stools daily for about 2 weeks.   Unclear source, blood thinners likely contribute to the amount of blood loss.  Planning on EGD tomorrow to start the workup.  If no obvious source then will need to consider colonoscopy; that may be safest done after full 5 day plavix washout but I think EGD is safe tomorrow after just 24 hour plavix hold.     Owens Loffler, MD [facility] Pager [phone]

## 2015-12-17 NOTE — Telephone Encounter (Signed)
Jeepers, last thing she needed.Marland KitchenMarland Kitchen

## 2015-12-18 ENCOUNTER — Other Ambulatory Visit: Payer: Self-pay

## 2015-12-18 ENCOUNTER — Encounter (HOSPITAL_COMMUNITY)
Admission: EM | Disposition: A | Discharge status: Home Health | Payer: Self-pay | Source: Home / Self Care | Attending: Internal Medicine

## 2015-12-18 ENCOUNTER — Encounter (HOSPITAL_COMMUNITY): Payer: Self-pay | Admitting: Gastroenterology

## 2015-12-18 ENCOUNTER — Inpatient Hospital Stay (HOSPITAL_COMMUNITY): Payer: Commercial Managed Care - HMO | Admitting: Anesthesiology

## 2015-12-18 DIAGNOSIS — I4892 Unspecified atrial flutter: Secondary | ICD-10-CM

## 2015-12-18 DIAGNOSIS — K922 Gastrointestinal hemorrhage, unspecified: Secondary | ICD-10-CM

## 2015-12-18 DIAGNOSIS — I25708 Atherosclerosis of coronary artery bypass graft(s), unspecified, with other forms of angina pectoris: Secondary | ICD-10-CM

## 2015-12-18 HISTORY — PX: ESOPHAGOGASTRODUODENOSCOPY (EGD) WITH PROPOFOL: SHX5813

## 2015-12-18 LAB — CBC WITH DIFFERENTIAL/PLATELET
Basophils Absolute: 0 10*3/uL (ref 0.0–0.1)
Basophils Relative: 1 %
Eosinophils Absolute: 0.2 10*3/uL (ref 0.0–0.7)
Eosinophils Relative: 4 %
HCT: 23.2 % — ABNORMAL LOW (ref 36.0–46.0)
Hemoglobin: 7.7 g/dL — ABNORMAL LOW (ref 12.0–15.0)
Lymphocytes Relative: 41 %
Lymphs Abs: 2.4 10*3/uL (ref 0.7–4.0)
MCH: 28.3 pg (ref 26.0–34.0)
MCHC: 33.2 g/dL (ref 30.0–36.0)
MCV: 85.3 fL (ref 78.0–100.0)
Monocytes Absolute: 0.6 10*3/uL (ref 0.1–1.0)
Monocytes Relative: 9 %
Neutro Abs: 2.7 10*3/uL (ref 1.7–7.7)
Neutrophils Relative %: 45 %
Platelets: 227 10*3/uL (ref 150–400)
RBC: 2.72 MIL/uL — ABNORMAL LOW (ref 3.87–5.11)
RDW: 19.1 % — ABNORMAL HIGH (ref 11.5–15.5)
WBC: 5.9 10*3/uL (ref 4.0–10.5)

## 2015-12-18 LAB — BASIC METABOLIC PANEL
Anion gap: 5 (ref 5–15)
BUN: 35 mg/dL — ABNORMAL HIGH (ref 6–20)
CO2: 27 mmol/L (ref 22–32)
Calcium: 10.3 mg/dL (ref 8.9–10.3)
Chloride: 105 mmol/L (ref 101–111)
Creatinine, Ser: 0.86 mg/dL (ref 0.44–1.00)
GFR calc Af Amer: >60 mL/min (ref >60)
GFR calc non Af Amer: >60 mL/min (ref >60)
Glucose, Bld: 110 mg/dL — ABNORMAL HIGH (ref 65–99)
Potassium: 4.6 mmol/L (ref 3.5–5.1)
Sodium: 137 mmol/L (ref 135–145)

## 2015-12-18 LAB — APTT: aPTT: 20 seconds — ABNORMAL LOW (ref 24–36)

## 2015-12-18 LAB — GLUCOSE, CAPILLARY
Glucose-Capillary: 95 mg/dL (ref 65–99)
Glucose-Capillary: 90 mg/dL (ref 65–99)
Glucose-Capillary: 104 mg/dL — ABNORMAL HIGH (ref 65–99)

## 2015-12-18 LAB — PROTIME-INR
INR: 0.97
Prothrombin Time: 12.9 seconds (ref 11.4–15.2)

## 2015-12-18 LAB — PREPARE RBC (CROSSMATCH)

## 2015-12-18 LAB — HEPARIN LEVEL (UNFRACTIONATED): Heparin Unfractionated: <0.1 IU/mL — ABNORMAL LOW (ref 0.30–0.70)

## 2015-12-18 SURGERY — ESOPHAGOGASTRODUODENOSCOPY (EGD) WITH PROPOFOL
Anesthesia: Monitor Anesthesia Care

## 2015-12-18 MED ORDER — CARVEDILOL 3.125 MG PO TABS
3.1250 mg | ORAL_TABLET | Freq: Two times a day (BID) | ORAL | Status: DC
  Administered 2015-12-19 – 2015-12-23 (×8): 3.125 mg via ORAL
  Filled 2015-12-18 (×7): qty 1

## 2015-12-18 MED ORDER — PROPOFOL 10 MG/ML IV BOLUS
INTRAVENOUS | Status: DC | PRN
  Administered 2015-12-18 (×5): 10 mg via INTRAVENOUS

## 2015-12-18 MED ORDER — LISINOPRIL 10 MG PO TABS
10.0000 mg | ORAL_TABLET | Freq: Every day | ORAL | Status: DC
  Administered 2015-12-19 – 2015-12-20 (×2): 10 mg via ORAL
  Filled 2015-12-18 (×2): qty 1

## 2015-12-18 MED ORDER — SODIUM CHLORIDE 0.9 % IV BOLUS (SEPSIS)
500.0000 mL | Freq: Once | INTRAVENOUS | Status: AC
  Administered 2015-12-18: 500 mL via INTRAVENOUS

## 2015-12-18 MED ORDER — SODIUM CHLORIDE 0.9 % IV SOLN
Freq: Once | INTRAVENOUS | Status: AC
  Administered 2015-12-18: 16:00:00 via INTRAVENOUS

## 2015-12-18 MED ORDER — SODIUM CHLORIDE 0.9 % IV SOLN
INTRAVENOUS | Status: DC
  Administered 2015-12-18: 12:00:00 via INTRAVENOUS

## 2015-12-18 MED ORDER — PANTOPRAZOLE SODIUM 40 MG PO TBEC
40.0000 mg | DELAYED_RELEASE_TABLET | Freq: Every day | ORAL | Status: DC
  Administered 2015-12-20 – 2015-12-23 (×3): 40 mg via ORAL
  Filled 2015-12-18 (×4): qty 1

## 2015-12-18 MED ORDER — LACTATED RINGERS IV SOLN: INTRAVENOUS | Status: DC | PRN

## 2015-12-18 MED ORDER — HEPARIN (PORCINE) IN NACL 100-0.45 UNIT/ML-% IJ SOLN
850.0000 [IU]/h | INTRAMUSCULAR | Status: AC
  Administered 2015-12-18 – 2015-12-20 (×2): 850 [IU]/h via INTRAVENOUS
  Filled 2015-12-18 (×2): qty 250

## 2015-12-18 SURGICAL SUPPLY — 14 items

## 2015-12-18 NOTE — H&P (View-Only) (Signed)
Consultation  Referring Provider:  Dr. Jearld Shines    Primary Care Physician:  [name], MD Primary Gastroenterologist:  Althia Forts     Reason for Consultation: Melena, Symptomatic anemia           HPI:   [name] is a 75 y.o. female with past medical history of CAD status post CABG and stent placement in March 2017 maintained on Plavix and Xarelto, hypertension, paroxysmal A. fib, PVD, aortic insufficiency and tricuspid regurgitation and presented to the ER due to loss of consciousness this morning. Per chart review patient has been recently admitted twice for uncontrolled blood pressure and syncopal episodes.   Today, the patient describes that she had a syncopal episode last night around 9 PM when walking to the bathroom. Apparently her granddaughter helped her to the floor but she did lose consciousness briefly. She was not incontinent of bowel or urine. The patient tells me she believes this is because they are "still working on getting medication straight". She does admit that yesterday she found a bottle of hydrochlorothiazide and thought she was supposed to be taking this, so she took her first tell yesterday even though she was already feeling very "weak". The patient does describe setting at the breakfast table in barely being able to hold her head up to eat. She tells me that since leaving the hospital last on 12/09/15 she has been dealing with constipation and tells me that even while in the hospital she was noticing some "black sticky" stool. Patient tells me she is on a vitamin which has some iron in it, but denies any use of Pepto-Bismol. This is a change for her and she always had regular bowel movements before then. The patient thought that possibly her black stool was just due to all of her different medications and thought nothing of it. She did take her Xarelto last night and Plavix initially morning. Associated was doing included decrease in appetite over the past month  or so.   Patient denies fever, chills, abdominal pain, heartburn, reflux, nausea, vomiting, shortness of breath or use of NSAIDs.  ER course: Patient found to have a 3 g drop in hemoglobin, currently at 8.9 from 12.3 10 days ago. Stool occult positive  Previous GI history: Per patient report colo the past  "a long time ago"-unsure of Doctor/Clinic 10/20/2007-segmental resection of the sigmoid colon due to what appears to be diverticular disease? (Path in results review)  Past Medical History:  Diagnosis Date  . ANXIETY 12/31/2009  . Aortic insufficiency   . CAD (coronary artery disease)    a. s/p CABGx3 and LAA clipping in 12/2014, NSTEMI 05/2015 s/p DES to native RCA and SVG-dRCA; occluded ramus-SVG was treated medically). c. neg nuc 10/2015 at [facility].  Marland Kitchen DEPRESSION 12/31/2009  . [place] DISEASE, CERVICAL 12/31/2009  . [place] DISEASE, LUMBAR 12/31/2009  . DIVERTICULITIS, HX OF 12/31/2009  . Essential hypertension   . GLUCOSE INTOLERANCE 12/31/2009  . Hyperlipidemia 12/31/2009  . MENOPAUSE, EARLY 12/31/2009  . NSTEMI (non-ST elevated myocardial infarction) ([place]) 05/11/2015  . OSTEOARTHRITIS, HAND   . Pacemaker 01/21/2012   MDT Adapta dual chamber  . Paroxysmal atrial flutter ([place])   . Persistent atrial fibrillation ([place])   . PVD (peripheral vascular disease), hx stents to bil SFAs 02/2010   . Symptomatic sinus bradycardia 01/22/2012  . Tachy-brady syndrome ([place])    a. s/p PPM 2013.  . Tricuspid regurgitation     Past Surgical History:  Procedure Laterality Date  .  AUGMENTATION MAMMAPLASTY  1983  . CARDIAC CATHETERIZATION N/A 01/01/2015   Procedure: Left Heart Cath and Coronary Angiography;  Surgeon: Jettie Booze, MD;  Location: [place] CV LAB;  Service: Cardiovascular;  Laterality: N/A;  . CARDIAC CATHETERIZATION N/A 05/12/2015   Procedure: Left Heart Cath and Coronary Angiography;  Surgeon: Troy Sine, MD;  Location: [place] [facility];  Service: Cardiovascular;   Laterality: N/A;  . CARDIAC CATHETERIZATION N/A 05/12/2015   Procedure: Coronary Stent Intervention;  Surgeon: Troy Sine, MD;  Location: [place] [facility];  Service: Cardiovascular;  Laterality: N/A;  . CARDIOVERSION N/A 02/01/2015   Procedure: CARDIOVERSION;  Surgeon: Lelon Perla, MD;  Location: [facility] ENDOSCOPY;  Service: Cardiovascular;  Laterality: N/A;  . CARDIOVERSION N/A 08/30/2015   Procedure: CARDIOVERSION;  Surgeon: Sanda Klein, MD;  Location: [place] ENDOSCOPY;  Service: Cardiovascular;  Laterality: N/A;  . CARPAL TUNNEL RELEASE  2008   "right hand/thumb; carpal tunnel repair; got rid of arthritis" (01/21/2012)  . CLIPPING OF ATRIAL APPENDAGE N/A 01/04/2015   Procedure: CLIPPING OF ATRIAL APPENDAGE;  Surgeon: Grace Isaac, MD;  Location: [place];  Service: Open Heart Surgery;  Laterality: N/A;  . CORONARY ARTERY BYPASS GRAFT N/A 01/04/2015   Procedure: CORONARY ARTERY BYPASS GRAFTING (CABG) x 3 using left internal mammory artery and greater saphenous vein right leg harvested endoscopically.;  Surgeon: Grace Isaac, MD;  LIMA-LAD, SVG-RI, SVG-PDA  . FACELIFT, LOWER 2/3  1995   "mini" (01/21/2012)  . Lower Arterial Examination  10/28/2011   R. SFA stent mild-moderate mixed density plaque with elevated velocities consistent with 50% diameter reduction. L. SFA stent moderate mixed denisty plaque at mid to distal level consistent with 50-69% diameter reduction.  . OOPHORECTOMY  ~1979  . PARTIAL COLECTOMY  2010  . PERIPHERAL ARTERIAL STENT GRAFT  2012; 2012   "LLE; RLE" (01/21/2012)  . PERMANENT PACEMAKER INSERTION N/A 01/21/2012   Medtronic Adapta L implanted by Dr Sallyanne Kuster for tachy/brady syndrome  . POSTERIOR CERVICAL LAMINECTOMY  1985  . TEE WITHOUT CARDIOVERSION N/A 01/04/2015   Procedure: TRANSESOPHAGEAL ECHOCARDIOGRAM (TEE);  Surgeon: Grace Isaac, MD;  Location: [place];  Service: Open Heart Surgery;  Laterality: N/A;  . TEE WITHOUT CARDIOVERSION N/A 02/01/2015    Procedure: TRANSESOPHAGEAL ECHOCARDIOGRAM (TEE);  Surgeon: Lelon Perla, MD;  Location: [facility] ENDOSCOPY;  Service: Cardiovascular;  Laterality: N/A;  . TEE WITHOUT CARDIOVERSION N/A 08/30/2015   Procedure: TRANSESOPHAGEAL ECHOCARDIOGRAM (TEE);  Surgeon: Sanda Klein, MD;  Location: [facility] ENDOSCOPY;  Service: Cardiovascular;  Laterality: N/A;  . VAGINAL HYSTERECTOMY  1975    Family History  Problem Relation Age of Onset  . Heart disease Mother   . Hypertension Mother   . Stroke Mother   . Stroke Father   . Heart disease Father   . Heart disease Brother   . Hypertension Brother     2 brothers  . CVA Brother     2 brothers  . Heart attack Brother     2 brothers   Social History  Substance Use Topics  . Smoking status: Former Smoker    Packs/day: 0.75    Years: 10.00    Types: Cigarettes  . Smokeless tobacco: Never Used     Comment: 01/21/2012 "quit smoking ~ 2002"  . Alcohol use Yes     Comment: 01/21/2012 "3 times/wk I have a couple mixed drinks"; 06/28/2015 "nothing in the last 3 weeks cause I haven't felt good", reduced recently    Prior to Admission  medications   Medication Sig Start Date End Date Taking? Authorizing Provider  amiodarone (PACERONE) 100 MG tablet Take 1 tablet (100 mg total) by mouth daily. Patient taking differently: Take 100 mg by mouth every morning.  10/17/15  Yes Mihai Croitoru, MD  carvedilol (COREG) 6.25 MG tablet Take 1 tablet (6.25 mg total) by mouth 2 (two) times daily with a meal. 12/09/15  Yes Theodis Blaze, MD  cholecalciferol (VITAMIN D) 1000 UNITS tablet Take 1,000 Units by mouth at bedtime.    Yes Historical Provider, MD  citalopram (CELEXA) 20 MG tablet Take 0.5 tablets (10 mg total) by mouth daily. Patient taking differently: Take 10 mg by mouth every morning.  12/09/15  Yes Theodis Blaze, MD  clopidogrel (PLAVIX) 75 MG tablet TAKE 1 TABLET (75 MG TOTAL) BY MOUTH DAILY. Patient taking differently: Take 75 mg by mouth every morning.  12/02/15   Yes Mihai Croitoru, MD  Coenzyme Q10 (COQ10 PO) Take 120 mg by mouth at bedtime.    Yes Historical Provider, MD  hydrochlorothiazide (HYDRODIURIL) 25 MG tablet Take 25 mg by mouth once.   Yes Historical Provider, MD  lisinopril (PRINIVIL,ZESTRIL) 30 MG tablet Take 1 tablet (30 mg total) by mouth daily. Patient taking differently: Take 30 mg by mouth every morning.  12/09/15  Yes Theodis Blaze, MD  Multiple Vitamin (MULTIVITAMIN) capsule Take 1 capsule by mouth 2 (two) times daily.    Yes Historical Provider, MD  nitroGLYCERIN (NITROSTAT) 0.4 MG SL tablet Place 1 tablet (0.4 mg total) under the tongue every 5 (five) minutes x 3 doses as needed for chest pain. 05/15/15  Yes Brittainy Erie Noe, PA-C  rivaroxaban (XARELTO) 15 MG TABS tablet Take 1 tablet (15 mg total) by mouth daily with supper. 11/04/15  Yes Sanda Klein, MD    Current Facility-Administered Medications  Medication Dose Route Frequency Provider Last Rate Last Dose  . acetaminophen (TYLENOL) tablet 650 mg  650 mg Oral Q6H PRN Rise Patience, MD       Or  . acetaminophen (TYLENOL) suppository 650 mg  650 mg Rectal Q6H PRN Rise Patience, MD      . acetaminophen (TYLENOL) tablet 650 mg  650 mg Oral Q6H PRN Rise Patience, MD   650 mg at 12/17/15 0510  . amiodarone (PACERONE) tablet 100 mg  100 mg Oral Daily Rise Patience, MD      . carvedilol (COREG) tablet 6.25 mg  6.25 mg Oral BID WC Rise Patience, MD   6.25 mg at 12/17/15 0823  . citalopram (CELEXA) tablet 10 mg  10 mg Oral Daily Rise Patience, MD      . Derrill Memo ON 12/18/2015] Influenza vac split quadrivalent PF (FLUARIX) injection 0.5 mL  0.5 mL Intramuscular Tomorrow-1000 Rise Patience, MD      . lisinopril (PRINIVIL,ZESTRIL) tablet 30 mg  30 mg Oral Daily Rise Patience, MD      . nitroGLYCERIN (NITROSTAT) SL tablet 0.4 mg  0.4 mg Sublingual Q5 Min x 3 PRN Rise Patience, MD      . ondansetron [facility]) tablet 4 mg  4 mg Oral Q6H PRN  Rise Patience, MD       Or  . ondansetron (ZOFRAN) injection 4 mg  4 mg Intravenous Q6H PRN Rise Patience, MD      . pantoprazole (PROTONIX) 80 mg in sodium chloride 0.9 % 100 mL IVPB  80 mg Intravenous Once Rise Patience, MD      .  pantoprazole (PROTONIX) 80 mg in sodium chloride 0.9 % 250 mL (0.32 mg/mL) infusion  8 mg/hr Intravenous Continuous Rise Patience, MD      . Derrill Memo ON 12/20/2015] pantoprazole (PROTONIX) injection 40 mg  40 mg Intravenous Q12H Rise Patience, MD        Allergies as of 12/16/2015 - Review Complete 12/16/2015  Allergen Reaction Noted  . Brilinta [ticagrelor] Shortness Of Breath 06/28/2015  . Clonidine derivatives Other (See Comments) 10/27/2012  . Atorvastatin Other (See Comments) 06/28/2015  . Crestor [rosuvastatin calcium] Other (See Comments) 09/25/2015  . Exforge [amlodipine besylate-valsartan] Itching and Rash 10/27/2012     Review of Systems:     Constitutional: Positive for weakness No weight loss, fever, chills or fatigue HEENT: Eyes: No change in vision               Ears, Nose, Throat:  No change in hearing or congestion Skin: No rash or itching Cardiovascular: No chest pain, chest pressure or palpitations   Respiratory: No SOB or cough Gastrointestinal: See HPI and otherwise negative Genitourinary: No dysuria or change in urinary frequency Neurological: Positive for dizziness and syncope No headache Musculoskeletal: No new muscle or joint pain Hematologic: No bruising Psychiatric: No history of depression or anxiety    Physical Exam:  Vital signs in last 24 hours: Temp:  [97.4 F (36.3 C)-98.6 F (37 C)] 98.6 F (37 C) (11/07 0500) Pulse Rate:  [75-79] 79 (11/07 0500) Resp:  [14-18] 16 (11/07 0500) BP: (121-148)/(56-68) 121/68 (11/07 0500) SpO2:  [99 %-100 %] 99 % (11/07 0500) Weight:  [135 lb 12.8 oz (61.6 kg)] 135 lb 12.8 oz (61.6 kg) (11/07 0248) Last BM Date: 12/16/15 General:   Pleasant Caucasian  female appears to be in NAD, Well developed, Well nourished, alert and cooperative Head:  Normocephalic and atraumatic. Eyes:   PEERL, EOMI. No icterus. Conjunctiva pink. Ears:  Normal auditory acuity. Neck:  Supple Throat: Oral cavity and pharynx without inflammation, swelling or lesion.  Lungs: Respirations even and unlabored. Lungs clear to auscultation bilaterally.   No wheezes, crackles, or rhonchi.  Heart: Normal S1, S2. No MRG. Regular rate and rhythm. No peripheral edema, cyanosis or pallor.  Abdomen:  Soft, nondistended, nontender. No rebound or guarding. Normal bowel sounds. No appreciable masses or hepatomegaly. Rectal:  Not performed.  Msk:  Symmetrical without gross deformities. Peripheral pulses intact.  Extremities:  Without edema, no deformity or joint abnormality.  Neurologic:  Alert and  oriented x4;  grossly normal neurologically.  Skin:   Dry and intact without significant lesions or rashes. Psychiatric: Oriented to person, place and time. Demonstrates good judgement and reason without abnormal affect or behaviors.   LAB RESULTS:  Recent Labs  12/16/15 2343 12/17/15 0647  WBC 7.7 5.9  HGB 9.9* 8.9*  HCT 29.2* 26.9*  PLT 274 237   BMET  Recent Labs  12/16/15 2343 12/17/15 0647  NA 135 137  K 3.7 3.8  CL 104 104  CO2 27 27  GLUCOSE 147* 127*  BUN 51* 45*  CREATININE 1.00 0.91  CALCIUM 11.0* 10.4*   LFT  Recent Labs  12/17/15 0647  PROT 5.8*  ALBUMIN 3.4*  AST 19  ALT 15  ALKPHOS 70  BILITOT 0.6   PT/INR No results for input(s): LABPROT, INR in the last 72 hours.  STUDIES: Dg Toe 2nd Right  Result Date: 12/17/2015 CLINICAL DATA:  Right second toe pain after a fall today. Syncopal episode. EXAM: RIGHT SECOND TOE COMPARISON:  None. FINDINGS: Dorsal dislocation of the middle phalanx of the right second toe with respect to the proximal phalanx with tiny bone fragment inferiorly suggesting an avulsion fracture. Soft tissue swelling. IMPRESSION:  Dislocation of the proximal interphalangeal joint of the right second toe with tiny avulsion fracture. Electronically Signed   By: Lucienne Capers M.D.   On: 12/17/2015 01:29     PREVIOUS ENDOSCOPIES:            None?   Impression / Plan:   Impression: 1. Upper GI bleed:Patient reports "black sticky" stools since the end of October on a daily basis, episode of syncope yesterday, she blames on her blood pressure medications, no history of upper GI symptoms; consider PUD versus AVM versus gastritis versus other 2. Right foot second toe dislocation: Patient being followed by orthopedics today 3. CAD status post CABG and recent stent placed in March: Maintained on Plavix, held currently, last dose yesterday 8am 4. A. fib and pacemaker placement: Xarelto on hold currently, last dose yesterday 7 pm 5. Acute blood loss anemia: The patient's hemoglobin was 12.3 10 days ago, now 8.9 with 1 g drop overnight  Plan: 1. Continue to monitor hemoglobin every 6 hours with transfusion as necessary less than 7 2. Continue supportive measures 3. Continue BID Protonix 4. Will start patient on clear liquid diet and then nothing by mouth after midnight 5. Will proceed with EGD for further evaluation of possible upper GI bleed. Discussed risks, benefits, limitations and alternatives of patient agrees to proceed. This was scheduled with Dr. Ardis Hughs tomorrow afternoon. 6. Continue to hold anticoagulation 7. Please await any further recommendations from Dr. Ardis Hughs.  Thank you for your kind consultation, we will continue to follow.  Lavone Nian Lemmon  12/17/2015, 9:22 AM Pager #: [phone]   ________________________________________________________________________  Velora Heckler GI MD note:  I personally examined the patient, reviewed the data and agree with the assessment and plan described above. 75 yo woman with severe heart disease on plavix and xarelto.  Has had melenic stools daily for about 2 weeks.   Unclear source, blood thinners likely contribute to the amount of blood loss.  Planning on EGD tomorrow to start the workup.  If no obvious source then will need to consider colonoscopy; that may be safest done after full 5 day plavix washout but I think EGD is safe tomorrow after just 24 hour plavix hold.     Owens Loffler, MD [facility] Pager [phone]

## 2015-12-18 NOTE — Op Note (Signed)
[facility] Patient Name: [name] Procedure Date: 12/18/2015 MRN: [id number] Attending MD: Milus Banister , MD Date of Birth: 1940/08/19 CSN: [id number] Age: 75 Admit Type: Inpatient Procedure:                Upper GI endoscopy Indications:              Melena Providers:                Milus Banister, MD, Hilma Favors, RN, Corliss Parish, Technician Referring MD:              Medicines:                Monitored Anesthesia Care Complications:            No immediate complications. Estimated blood loss:                            None. Estimated Blood Loss:     Estimated blood loss: none. Procedure:                Pre-Anesthesia Assessment:                           - Prior to the procedure, a History and Physical                            was performed, and patient medications and                            allergies were reviewed. The patient's tolerance of                            previous anesthesia was also reviewed. The risks                            and benefits of the procedure and the sedation                            options and risks were discussed with the patient.                            All questions were answered, and informed consent                            was obtained. Prior Anticoagulants: The patient has                            taken Xarelto (rivaroxaban), last dose was 2 days                            prior to procedure, also plavix, last dose was                            yesterday. ASA  Grade Assessment: III - A patient                            with severe systemic disease. After reviewing the                            risks and benefits, the patient was deemed in                            satisfactory condition to undergo the procedure.                           After obtaining informed consent, the endoscope was                            passed under direct vision. Throughout the                     procedure, the patient's blood pressure, pulse, and                            oxygen saturations were monitored continuously. The                            [id number]) scope was introduced through the                            mouth, and advanced to the second part of duodenum.                            The upper GI endoscopy was accomplished without                            difficulty. The patient tolerated the procedure                            well. Scope In: Scope Out: Findings:      The esophagus was normal.      The stomach was normal.      The examined duodenum was normal.      There was no recent or old blood in the UGI tract. Impression:               - Normal esophagus.                           - Normal stomach.                           - Normal examined duodenum.                           - No specimens collected. Moderate Sedation:      N/A- Per Anesthesia Care Recommendation:           - Return patient to hospital ward for ongoing care.                           -  Resume previous diet.                           - Continue present medications.                           - Will need to proceed with colonoscopy. Her last                            plavix dose was on 11/6. 5 day plavix washout is                            preferred before colonoscopy; that would make it                            safe for colonoscopy on Saturday November 11.                            Xarelto has a quicker washout. She can resume that                            today given high risk heart disease and then will                            need to not take it on Friday November 10th. For                            now, cardiac diet and observe for further bleeding. Procedure Code(s):        --- Professional ---                           [phone], Esophagogastroduodenoscopy, flexible,                            transoral; diagnostic, including collection of                             specimen(s) by brushing or washing, when performed                            (separate procedure) Diagnosis Code(s):        --- Professional ---                           K92.1, Melena (includes Hematochezia) CPT copyright 2016 [employer]. All rights reserved. The codes documented in this report are preliminary and upon coder review may  be revised to meet current compliance requirements. Milus Banister, MD 12/18/2015 12:49:55 PM This report has been signed electronically. Number of Addenda: 0

## 2015-12-18 NOTE — Progress Notes (Addendum)
PROGRESS NOTE    [name]  [id number] DOB: 01/22/1941 DOA: 12/16/2015 PCP: [name], MD  Brief Narrative:  [name] is a 75 y.o. female with history of CAD status post CABG and stents placement recently in March 2017, Hypertension, paroxysmal atrial fibrillation presents to the ER because of loss of consciousness. Patient was recently admitted twice for uncontrolled blood pressure and syncopal episode was brought to the ER after patient had syncopal episode last night around 9 PM while walking to the bathroom. Patient states her granddaughter helped her to the floor but she did loose consciousness briefly. Did not have any incontinence of bowel or urine. Denied any chest pain or shortness of breath. Since last discharge patient has been very careful in getting out of the bed due to dizziness. Patient accidentally took hydrochlorothiazide yesterday. In the ER patient was found to have a hemoglobin of 9 which is 3 g drop from previous and patient states she also noticed black stools last 2 days. Stool for occult blood is positive. Patient states while patient was having the syncopal episode she may have caught her leg into the cabinet and patient has pain in the right foot which x-rays show has dislocated right second toe. ER physician was unable to fix it. Orthopedics was consulted for dislocation of toe. She underwent an  EGD this AM done by Dr. Ardis Hughs.  Assessment & Plan:   Principal Problem:   Syncope Active Problems:   Essential hypertension   PAF (paroxysmal atrial fibrillation) (HCC)   S/P placement of cardiac pacemaker, medtronic adapta 01/21/12   PVD (peripheral vascular disease), hx stents to bil SFAs 02/2010   CAD (coronary artery disease)   S/P CABG x 3 01/04/15   Febrile illness, acute   Paroxysmal atrial flutter (HCC)   Chronic diastolic heart failure (HCC)   Hypercalcemia   Acute GI bleeding   Acute blood loss anemia   CKD (chronic kidney disease), stage  II  Acute GI Bleed  -Suspected upper GI bleed given the melanotic stool.  -Denies taking any NSAIDs. Continue Holding Xarelto and Plavix for now.  -Follow CBC. -Gastroenterology Dr. Dutch Quint EGD this AM -EGD done showed normal esophagus, normal stomach, and normal examined duodenem -Patient's CBC showed Hb/Hct of 7.7/23.2;-Will Transfuse 2 Units of pRBC -C/w Protonix gtt -Will need Colonoscopy and will target December 21, 2015 as patient's last Plavix dose was 11/6 -PER GI ok to resume Xarelto and Hold Friday November 10th in anticipation for Colonoscopy November 11th. Discussed with Cardiology Dr. Tamala Julian and will start Patient on Heparin gtt for better control of her Anticoagulation. Will D/C at [facility] in anticipation of Colonoscopy.   -Repeat H/H this evening.   Hypotension in the setting of likely Acute Upper GI Bleed -Has a Hx of Hypertension -Cardiology will decrease home Lisinopril from 30 mg daily to 10 mg daily and will also decrease Carvedilol to 3.125 mg BID.  -Bolus NS 500 mL over 2 Hours -Will Transfuse 2 units of pRBC and repeat H/H -Continue to Monitor VS  Syncope likely 2/2 to Volume Loss and Orthostasis -Cards and GI following -Will transfuse 2 units of pRBC -C/w Telemetry; If reoccurs will obtain Echocardiogram and Orthostatic Vital Signs  Right Foot Second Toe Dislocation -Orthopedics Dr. Herbert Seta and performed Closed Reduction and Buddy Tape with Cast Shoe as needed -Will Follow up As an Outpatient and have Repeat Imaging at Office  CAD status post CABG and recent stents  -s/p CABG 1 year  ago -Cardiology following -Held Plavix. Discussed with Cardiology about Xarelto and will start Heparin gtt instead of Xarelto for better control. Talked with patient and she agrees to start Heparin gtt.  -Had CP this Am. Cardiology Recommends SL NTG as patient had Anginal Equivalent.  Paroxysmal Atrial fibrillation/ Atrial Flutter -Renally dosed Xarelto on  hold due to GI bleed. PER GI ok to restart. Discussed with Cardiology Dr. Tamala Julian and will Anticoagulate patient with Heparin gtt instead of Xarelto for better control. Continue to Monitor  -CHADS2VASC elevated at 6 -C/w Amiodarone 100 mg daily and Coreg 6.25 mg po BID -C/w Telemetry  Acute Blood Loss Anemia -As above. Will transfuse 2 units of pRBC and follow CBC. -Repeat H/H this evening after blood.   Hypercalcemia  -Improved.  -Suspect could be due to dehydration and HCTZ.  -Continue to Follow Metabolic Panel  DVT prophylaxis: SCDs; Heparin gtt Code Status: Do Not Intubate Family Communication: Discussed Plan of Care with patient and patient's daughter at bedside. Disposition Plan: Pending PT Evaluation  Consultants:  Cardiology Gastroenterology Orthopedic Surgery  Procedures: EGD today; Colonoscopy 12/21/15  Antimicrobials: None  Subjective: Seen and examined at bedside and she was very anxious. Had some CP this earlier but resolved. States she is a little nauseous after EGD. Discussed with her about Anticoagulation and will start Heparin gtt. Patient admits to being severely weak and hopes 2 units of blood will help. No other complaints or concerns at this time.   Objective: Vitals:   12/18/15 0609 12/18/15 0642 12/18/15 0912 12/18/15 1034  BP: (!) 109/52 114/63 112/64 (!) 92/51  Pulse: 77 76 75 76  Resp: 16 16    Temp: 98 F (36.7 C) 98 F (36.7 C)    TempSrc: Oral Oral    SpO2: 98% 100%    Weight:      Height:        Intake/Output Summary (Last 24 hours) at 12/18/15 1138 Last data filed at 12/18/15 0700  Gross per 24 hour  Intake              120 ml  Output             1550 ml  Net            -1430 ml   Filed Weights   12/17/15 0248  Weight: 61.6 kg (135 lb 12.8 oz)   Examination: Physical Exam:  Constitutional: WN/WD, ill and weak appearing Eyes: Lids normal with some conjunctival pallor. sclerae anicteric  ENMT: External Ears, Nose appear normal.  Grossly normal hearing. Mucous membranes appear dry.  Neck: Appears normal, supple, no cervical masses, normal ROM, no appreciable thyromegaly, no JVD Respiratory: Clear to auscultation bilaterally, no wheezing, rales, rhonchi or crackles. Normal respiratory effort and patient is not tachypenic. No accessory muscle use.  Cardiovascular: RRR, no murmurs / rubs / gallops. S1 and S2 auscultated. No extremity edema. 2+ pedal pulses.   Abdomen: Soft, non-tender, non-distended. No masses palpated. No appreciable hepatosplenomegaly. Bowel sounds positive x4.  GU: Deferred. Musculoskeletal:  Right foot in shoe.  Skin: No rashes, lesions, ulcers. No induration; Warm and dry.  Neurologic: CN 2-12 grossly intact with no focal deficits. Sensation intact in all 4 Extremities. Romberg sign cerebellar reflexes not assessed.  Psychiatric: Normal judgment and insight. Alert and oriented x 3. Anxious mood and flat affect.   Data Reviewed: I have personally reviewed following labs and imaging studies  CBC:  Recent Labs Lab 12/16/15 2343 12/17/15 0647 12/17/15 1050 12/17/15  1404 12/18/15 0456  WBC 7.7 5.9 7.6 7.6 5.9  NEUTROABS  --   --   --   --  2.7  HGB 9.9* 8.9* 8.6* 8.4* 7.7*  HCT 29.2* 26.9* 25.7* 25.0* 23.2*  MCV 82.3 84.1 82.6 82.5 85.3  PLT 274 237 230 232 [id number]   Basic Metabolic Panel:  Recent Labs Lab 12/16/15 2343 12/17/15 0647 12/18/15 0456  NA 135 137 137  K 3.7 3.8 4.6  CL 104 104 105  CO2 27 27 27   GLUCOSE 147* 127* 110*  BUN 51* 45* 35*  CREATININE 1.00 0.91 0.86  CALCIUM 11.0* 10.4* 10.3   GFR: Estimated Creatinine Clearance: 48.8 mL/min (by C-G formula based on SCr of 0.86 mg/dL). Liver Function Tests:  Recent Labs Lab 12/17/15 0647  AST 19  ALT 15  ALKPHOS 70  BILITOT 0.6  PROT 5.8*  ALBUMIN 3.4*   No results for input(s): LIPASE, AMYLASE in the last 168 hours. No results for input(s): AMMONIA in the last 168 hours. Coagulation Profile: No results for  input(s): INR, PROTIME in the last 168 hours. Cardiac Enzymes:  Recent Labs Lab 12/17/15 0647  TROPONINI <0.03   BNP (last 3 results) No results for input(s): PROBNP in the last 8760 hours. HbA1C: No results for input(s): HGBA1C in the last 72 hours. CBG:  Recent Labs Lab 12/16/15 2319 12/17/15 1638 12/17/15 2348 12/18/15 0745  GLUCAP 123* 107* 98 95   Lipid Profile: No results for input(s): CHOL, HDL, LDLCALC, TRIG, CHOLHDL, LDLDIRECT in the last 72 hours. Thyroid Function Tests: No results for input(s): TSH, T4TOTAL, FREET4, T3FREE, THYROIDAB in the last 72 hours. Anemia Panel: No results for input(s): VITAMINB12, FOLATE, FERRITIN, TIBC, IRON, RETICCTPCT in the last 72 hours. Sepsis Labs: No results for input(s): PROCALCITON, LATICACIDVEN in the last 168 hours.  No results found for this or any previous visit (from the past 240 hour(s)).   Radiology Studies: Dg Toe 2nd Right  Result Date: 12/17/2015 CLINICAL DATA:  Right second toe dislocation status post reduction EXAM: RIGHT SECOND TOE COMPARISON:  Film from earlier in the same day FINDINGS: The reduction of the PIP joint has been performed. Anatomic alignment is now well exchanged. A tiny bony density is again seen adjacent to the distal aspect of the proximal phalanx which may be related to a small avulsion. No other focal abnormality is noted. IMPRESSION: Status post reduction with possible small avulsion. Electronically Signed   By: Inez Catalina M.D.   On: 12/17/2015 17:13   Dg Toe 2nd Right  Result Date: 12/17/2015 CLINICAL DATA:  Right second toe pain after a fall today. Syncopal episode. EXAM: RIGHT SECOND TOE COMPARISON:  None. FINDINGS: Dorsal dislocation of the middle phalanx of the right second toe with respect to the proximal phalanx with tiny bone fragment inferiorly suggesting an avulsion fracture. Soft tissue swelling. IMPRESSION: Dislocation of the proximal interphalangeal joint of the right second toe with  tiny avulsion fracture. Electronically Signed   By: Lucienne Capers M.D.   On: 12/17/2015 01:29   Scheduled Meds: . amiodarone  100 mg Oral Daily  . bupivacaine (PF)  10-30 mL Infiltration Once  . carvedilol  6.25 mg Oral BID WC  . citalopram  10 mg Oral Daily  . Influenza vac split quadrivalent PF  0.5 mL Intramuscular Tomorrow-1000  . lidocaine  10-50 mL Intradermal Once  . lisinopril  30 mg Oral Daily  . pantoprazole (PROTONIX) IVPB  80 mg Intravenous Once  . [START ON  12/20/2015] pantoprazole  40 mg Intravenous Q12H  . sodium chloride  500 mL Intravenous Once   Continuous Infusions: . pantoprozole (PROTONIX) infusion      LOS: 1 day   Kerney Elbe, DO [facility] Pager [phone]  If 7PM-7AM, please contact night-coverage [email or web address] Password [email or web address] 12/18/2015, 11:38 AM

## 2015-12-18 NOTE — Progress Notes (Signed)
At [phone] pt called RN to bedside with c/o sudden onset moderate sharp chest pain.  Pt on telemetry. No changes noted on monitor.  VS taken as 0642 as follows, Temp 98.0 Oral, BP 114/60 (80), HR 76, Resp 16, O2 Sat 100% on RA. Pt reports episode resolved in <1 minute.  At [place] pt states that she is no longer having any pain.  On Call paged and notified of episode at [phone].  At 0650 pt continues to state that she is no longer having any pain. Pt with PMH of anxiety, depression, aortic insufficiency 2017, CAD, CABGx3 2016, NSTEMI 05/2015, pacemaker - MDT adapta dual chamber, chronic diastolic heart failure, chronic fatigue, CKD stage II, HTN, diverticulosis, HLD, orthostatic hypotension, afib/aflutter, PVD with stents 2012, multiple cardioversions, exsmoker, habitual etoh use. Awaiting further instructions at this time.  Will continue to monitor patient status.  Petra Kuba, RN

## 2015-12-18 NOTE — Progress Notes (Signed)
Patient Name: [name] Date of Encounter: 12/18/2015  Primary Cardiologist: Dr. Delila Pereyra Problem List     Principal Problem:   Syncope Active Problems:   Essential hypertension   PAF (paroxysmal atrial fibrillation) (HCC)   S/P placement of cardiac pacemaker, medtronic adapta 01/21/12   PVD (peripheral vascular disease), hx stents to bil SFAs 02/2010   CAD (coronary artery disease)   S/P CABG x 3 01/04/15   Febrile illness, acute   Paroxysmal atrial flutter (HCC)   Chronic diastolic heart failure (HCC)   Hypercalcemia   Gastrointestinal hemorrhage   Acute blood loss anemia   CKD (chronic kidney disease), stage II    Subjective   Went for EGD this morning which the patient reports was normal. Denies any chest discomfort or palpitations.   Inpatient Medications    Scheduled Meds: . sodium chloride   Intravenous Once  . amiodarone  100 mg Oral Daily  . bupivacaine (PF)  10-30 mL Infiltration Once  . carvedilol  6.25 mg Oral BID WC  . citalopram  10 mg Oral Daily  . Influenza vac split quadrivalent PF  0.5 mL Intramuscular Tomorrow-1000  . lidocaine  10-50 mL Intradermal Once  . lisinopril  30 mg Oral Daily  . pantoprazole (PROTONIX) IVPB  80 mg Intravenous Once  . [START ON 12/20/2015] pantoprazole  40 mg Intravenous Q12H   Continuous Infusions: . pantoprozole (PROTONIX) infusion     PRN Meds: acetaminophen **OR** acetaminophen, acetaminophen, nitroGLYCERIN, ondansetron **OR** ondansetron (ZOFRAN) IV   Vital Signs    Vitals:   12/18/15 1250 12/18/15 1255 12/18/15 1300 12/18/15 1310  BP: (!) 78/35 (!) 85/57 (!) 101/38 (!) 98/37  Pulse: 76 73 75   Resp: 18 15 16    Temp:      TempSrc:      SpO2: 100% 99% 94% 90%  Weight:      Height:        Intake/Output Summary (Last 24 hours) at 12/18/15 1323 Last data filed at 12/18/15 1236  Gross per 24 hour  Intake              320 ml  Output             1550 ml  Net            -1230 ml   Filed  Weights   12/17/15 0248  Weight: 135 lb 12.8 oz (61.6 kg)    Physical Exam    GEN: Well nourished, well developed Caucasian female appearing in no acute distress.  HEENT: Grossly normal.  Neck: Supple, no JVD, carotid bruits, or masses. Cardiac: RRR, no murmurs, rubs, or gallops. No clubbing, cyanosis, edema.  Radials/DP/PT 2+ and equal bilaterally.  Respiratory:  Respirations regular and unlabored, clear to auscultation bilaterally. GI: Soft, nontender, nondistended, BS + x 4. MS: no deformity or atrophy. Skin: warm and dry, no rash. Neuro:  Strength and sensation are intact. Psych: AAOx3.  Normal affect.  Labs    CBC  Recent Labs  12/17/15 1404 12/18/15 0456  WBC 7.6 5.9  NEUTROABS  --  2.7  HGB 8.4* 7.7*  HCT 25.0* 23.2*  MCV 82.5 85.3  PLT 232 Q000111Q   Basic Metabolic Panel  Recent Labs  12/17/15 0647 12/18/15 0456  NA 137 137  K 3.8 4.6  CL 104 105  CO2 27 27  GLUCOSE 127* 110*  BUN 45* 35*  CREATININE 0.91 0.86  CALCIUM 10.4* 10.3   Liver Function Tests  Recent Labs  12/17/15 0647  AST 19  ALT 15  ALKPHOS 70  BILITOT 0.6  PROT 5.8*  ALBUMIN 3.4*   No results for input(s): LIPASE, AMYLASE in the last 72 hours. Cardiac Enzymes  Recent Labs  12/17/15 0647  TROPONINI <0.03   BNP Invalid input(s): POCBNP D-Dimer No results for input(s): DDIMER in the last 72 hours. Hemoglobin A1C No results for input(s): HGBA1C in the last 72 hours. Fasting Lipid Panel No results for input(s): CHOL, HDL, LDLCALC, TRIG, CHOLHDL, LDLDIRECT in the last 72 hours. Thyroid Function Tests No results for input(s): TSH, T4TOTAL, T3FREE, THYROIDAB in the last 72 hours.  Invalid input(s): FREET3  Telemetry    A-paced, HR in 70's. - Personally Reviewed  ECG    No new tracings.   Radiology    Dg Toe 2nd Right  Result Date: 12/17/2015 CLINICAL DATA:  Right second toe dislocation status post reduction EXAM: RIGHT SECOND TOE COMPARISON:  Film from earlier in  the same day FINDINGS: The reduction of the PIP joint has been performed. Anatomic alignment is now well exchanged. A tiny bony density is again seen adjacent to the distal aspect of the proximal phalanx which may be related to a small avulsion. No other focal abnormality is noted. IMPRESSION: Status post reduction with possible small avulsion. Electronically Signed   By: Inez Catalina M.D.   On: 12/17/2015 17:13   Dg Toe 2nd Right  Result Date: 12/17/2015 CLINICAL DATA:  Right second toe pain after a fall today. Syncopal episode. EXAM: RIGHT SECOND TOE COMPARISON:  None. FINDINGS: Dorsal dislocation of the middle phalanx of the right second toe with respect to the proximal phalanx with tiny bone fragment inferiorly suggesting an avulsion fracture. Soft tissue swelling. IMPRESSION: Dislocation of the proximal interphalangeal joint of the right second toe with tiny avulsion fracture. Electronically Signed   By: Lucienne Capers M.D.   On: 12/17/2015 01:29    Cardiac Studies   Echocardiogram: 12/04/2015 Study Conclusions  - Left ventricle: The cavity size was normal. Systolic function was   normal. The estimated ejection fraction was in the range of 55%   to 60%. Wall motion was normal; there were no regional wall   motion abnormalities. There was an increased relative   contribution of atrial contraction to ventricular filling.   Doppler parameters are consistent with abnormal left ventricular   relaxation (grade 1 diastolic dysfunction). - Aortic valve: Trileaflet; mildly thickened, mildly calcified   leaflets. There was moderate regurgitation.  Patient Profile     74F w/ PMH of CAD (s/p CABGx3 and LAA clipping in 12/2014, NSTEMI 05/2015 s/p DES to native RCA and SVG-dRCA; occluded ramus-SVG was treated medically), paroxysmal atrial fib, tachybrady syndrome (s/p PPM 2013), CKD stage II, orthostatic hypotension, PAD (s/p prior LE stenting), alcohol abuse, HTN, HLD, hyperkalemia, hypercalcemia,  chronic diastolic CHF, moderate AI, syncope, & multiple medication intolerances who presented to [facility] on 12/16/2015 with a GIB.  Assessment & Plan    1. GIB/ABL anemia  - Xarelto and Plavix on hold per primary team.  - has been transfused 2 units pRBCs. Went for EGD this morning. Results pending.  2. Syncope  - suspect related to blood pressure drop in the setting of volume loss and recent orthostasis. - telemetry showing A-paced rhythm.   3. Paroxysmal atrial fib/flutter  - This patients CHA2DS2-VASc Score and unadjusted Ischemic Stroke Rate (% per year) is equal to 9.7 % stroke rate/year from a score of 6 (HTN, Vascular,  Female, Age (2)), DM). Per notes last week, low burden ever since resuming Amiodarone. She is at high risk of stroke off anticoagulation but this clearly needs to be held given her ABL anemia. Consider interrogating her device to determine AF burden. - Continue Amiodarone 100mg  daily and Coreg 6.25mg  BID.  4. CAD  - s/p CABG in 12/2014, PCI 05/2015 - she is now > 6 months out from recent stenting. The original plan was to continue until April 2018 but with significant GIB, will review revised recommendations with MD. Plavix and Xarelto both currently held.   5. Stage 2 CKD - although Cr appears normal, CrCl is 46 (which is why she was on renally dosed Xarelto).   6. Hypotension - BP has been variable at 78/35 - 140/71 in the past 24 hours.  - will reduce Lisinopril dosing from 30mg  daily to 10mg  daily.  Signed, Erma Heritage, PA  12/18/2015, 1:23 PM  The patient has been seen in conjunction with [place], PA-C. All aspects of care have been considered and discussed. The patient has been personally interviewed, examined, and all clinical data has been reviewed.   She had anginal quality chest pain this morning. If this recurs she should get sublingual nitroglycerin.  IV heparin should be started. This will be helpful for her heart/potential ischemia  and to prevent embolic stroke if recurrent A. Fib.  Antiplatelet therapy is on hold.  Hypotension is concerning. We are cutting back the intensity of the antihypertensive regimen. Lisinopril is been decreased to 10 mg daily. I will decrease the carvedilol to 3.125 mg twice a day.  She is to get 2 units of blood, this will help her blood pressure. I believe she is volume contracted.

## 2015-12-18 NOTE — Progress Notes (Addendum)
[place] for IV heparin Indication: atrial fibrillation  Allergies  Allergen Reactions  . Brilinta [Ticagrelor] Shortness Of Breath  . Clonidine Derivatives Other (See Comments)    Lowers heart rate  . Atorvastatin Other (See Comments)    Possible cause of fatigue/malaise  . Crestor [Rosuvastatin Calcium] Other (See Comments)    Joint/muscle aches  . Spironolactone     Contraindicated with history of hyperkalemia  . Exforge [Amlodipine Besylate-Valsartan] Itching and Rash    Patient Measurements: Height: 5\' 4"  (162.6 cm) Weight: 135 lb 12.8 oz (61.6 kg) IBW/kg (Calculated) : 54.7 Heparin Dosing Weight: 62 kg (TBW)  Vital Signs: Temp: 98.2 F (36.8 C) (11/08 1600) Temp Source: Oral (11/08 1600) BP: 95/42 (11/08 1600) Pulse Rate: 75 (11/08 1600)  Labs:  Recent Labs  12/16/15 2343 12/17/15 0647 12/17/15 1050 12/17/15 1404 12/18/15 0456  HGB 9.9* 8.9* 8.6* 8.4* 7.7*  HCT 29.2* 26.9* 25.7* 25.0* 23.2*  PLT 274 237 230 232 227  CREATININE 1.00 0.91  --   --  0.86  TROPONINI  --  <0.03  --   --   --     Estimated Creatinine Clearance: 48.8 mL/min (by C-G formula based on SCr of 0.86 mg/dL).   Medical History: Past Medical History:  Diagnosis Date  . ANXIETY 12/31/2009  . Aortic insufficiency    a. mod by echo 2017.  [place] CAD (coronary artery disease)    a. s/p CABGx3 and LAA clipping in 12/2014, NSTEMI 05/2015 s/p DES to native RCA and SVG-dRCA; occluded ramus-SVG was treated medically). c. neg nuc 10/2015 at [facility].  . Chronic diastolic CHF (congestive heart failure) ([place])   . Chronic fatigue   . CKD (chronic kidney disease), stage II   . DEPRESSION 12/31/2009  . [place] DISEASE, CERVICAL 12/31/2009  . [place] DISEASE, LUMBAR 12/31/2009  . DIVERTICULITIS, HX OF 12/31/2009  . Essential hypertension   . GLUCOSE INTOLERANCE 12/31/2009  . Habitual alcohol use   . Hypercalcemia   . Hyperkalemia   . Hyperlipidemia 12/31/2009  .  MENOPAUSE, EARLY 12/31/2009  . NSTEMI (non-ST elevated myocardial infarction) ([place]) 05/11/2015  . Orthostatic hypotension   . OSTEOARTHRITIS, HAND   . Pacemaker 01/21/2012   MDT Adapta dual chamber  . Paroxysmal atrial flutter ([place])   . Persistent atrial fibrillation ([place])   . Pre-diabetes   . PVD (peripheral vascular disease), hx stents to bil SFAs 02/2010   . Symptomatic sinus bradycardia 01/22/2012  . Syncope    a. 11/2015 ? due to medications.  . Tachy-brady syndrome ([place])    a. s/p MDT PPM 2013.  . Tricuspid regurgitation     Medications:  Prescriptions Prior to Admission  Medication Sig Dispense Refill Last Dose  . amiodarone (PACERONE) 100 MG tablet Take 1 tablet (100 mg total) by mouth daily. (Patient taking differently: Take 100 mg by mouth every morning. ) 30 tablet 11 12/16/2015 at Unknown time  . carvedilol (COREG) 6.25 MG tablet Take 1 tablet (6.25 mg total) by mouth 2 (two) times daily with a meal. 60 tablet 0 12/16/2015 at 0900  . cholecalciferol (VITAMIN D) 1000 UNITS tablet Take 1,000 Units by mouth at bedtime.    12/15/2015 at Unknown time  . citalopram (CELEXA) 20 MG tablet Take 0.5 tablets (10 mg total) by mouth daily. (Patient taking differently: Take 10 mg by mouth every morning. )   12/16/2015 at Unknown time  . clopidogrel (PLAVIX) 75 MG tablet TAKE 1 TABLET (75 MG TOTAL) BY  MOUTH DAILY. (Patient taking differently: Take 75 mg by mouth every morning. ) 90 tablet 1 12/16/2015 at 0900  . Coenzyme Q10 (COQ10 PO) Take 120 mg by mouth at bedtime.    12/15/2015 at Unknown time  . hydrochlorothiazide (HYDRODIURIL) 25 MG tablet Take 25 mg by mouth once.   12/16/2015 at 0900  . lisinopril (PRINIVIL,ZESTRIL) 30 MG tablet Take 1 tablet (30 mg total) by mouth daily. (Patient taking differently: Take 30 mg by mouth every morning. ) 30 tablet 0 12/16/2015 at Unknown time  . Multiple Vitamin (MULTIVITAMIN) capsule Take 1 capsule by mouth 2 (two) times daily.    12/16/2015 at Unknown time   . nitroGLYCERIN (NITROSTAT) 0.4 MG SL tablet Place 1 tablet (0.4 mg total) under the tongue every 5 (five) minutes x 3 doses as needed for chest pain. 25 tablet 2 Past Week at Unknown time  . rivaroxaban (XARELTO) 15 MG TABS tablet Take 1 tablet (15 mg total) by mouth daily with supper. 30 tablet 5 12/16/2015 at 1800   Scheduled:  . amiodarone  100 mg Oral Daily  . bupivacaine (PF)  10-30 mL Infiltration Once  . carvedilol  3.125 mg Oral BID WC  . citalopram  10 mg Oral Daily  . Influenza vac split quadrivalent PF  0.5 mL Intramuscular Tomorrow-1000  . lidocaine  10-50 mL Intradermal Once  . [START ON 12/19/2015] lisinopril  10 mg Oral Daily  . pantoprazole (PROTONIX) IVPB  80 mg Intravenous Once  . [START ON 12/19/2015] pantoprazole  40 mg Oral Daily    Assessment: 75 y.o. female with past medical history of CAD status post CABG and stent placement in March 2017 maintained on Plavix and Xarelto, hypertension, paroxysmal A. fib, PVD, aortic insufficiency and tricuspid regurgitation and presented to the ER due to LOC. Hemoccult positive and patient reports melena, but EGD without findings. Plavix and Xarelto held in anticipation of colonoscopy on 11/11. Cardiology would like to initiate heparin given high CHADS2VASc score = 6   Baseline INR, aPTT, HL: pending (INR 1.41 on 10/26 - consistent with Xarelto use)  Prior anticoagulation: Xarelto (LD 11/6 at 1800), Plavix (LD 11/6 at 0900)  Significant events: 11/8: ordered 2 units PRBC  Today, 12/18/2015:  CBC: hgb continues to drop, 9.9 on admit; now 7.7. Ordered PRBC x 2  No additional bleeding or infusion issues per nursing  CrCl: 49 ml/min; SCr wnl  Goal of Therapy: Heparin level 0.3-0.7 units/ml Monitor platelets by anticoagulation protocol: Yes  Plan:  No bolus  Heparin 850 units/hr IV infusion  Check heparin level 8 hrs after start; will also check aPTT as recently on Xarelto (affects heparin levels)  Daily CBC, daily  heparin level once stable  Monitor for signs of bleeding or thrombosis   Reuel Boom, PharmD Pager: [phone] 12/18/2015, 4:38 PM    ADDENDUM Baseline heparin level undetectable; no need to check aPTTs going forward  Reuel Boom, PharmD, BCPS Pager: [phone] 12/18/2015, 6:39 PM

## 2015-12-18 NOTE — Progress Notes (Signed)
   Patient has been downstairs for EGD. Continue holding Xarelto and Plavix for now in the setting of her GIB and anemia. Will follow-up post-procedure or tomorrow morning pending when the procedure is complete.  Signed, Erma Heritage, PA-C 12/18/2015, 12:16 PM Pager: [phone]

## 2015-12-18 NOTE — Transfer of Care (Signed)
Immediate Anesthesia Transfer of Care Note  Patient: [name]  Procedure(s) Performed: Procedure(s): ESOPHAGOGASTRODUODENOSCOPY (EGD) WITH PROPOFOL (N/A)  Patient Location: PACU  Anesthesia Type:MAC  Level of Consciousness: sedated  Airway & Oxygen Therapy: Patient Spontanous Breathing and Patient connected to nasal cannula oxygen  Post-op Assessment: Report given to RN and Post -op Vital signs reviewed and stable  Post vital signs: Reviewed and stable  Last Vitals:  Vitals:   12/18/15 1034 12/18/15 1200  BP: (!) 92/51 (!) 121/50  Pulse: 76   Resp:  (!) 1  Temp:  36.7 C    Last Pain:  Vitals:   12/18/15 1200  TempSrc: Oral  PainSc:       Patients Stated Pain Goal: 2 ([id number])  Complications: No apparent anesthesia complications

## 2015-12-18 NOTE — Interval H&P Note (Signed)
History and Physical Interval Note:  12/18/2015 11:44 AM  [name]  has presented today for surgery, with the diagnosis of Melena  The various methods of treatment have been discussed with the patient and family. After consideration of risks, benefits and other options for treatment, the patient has consented to  Procedure(s): ESOPHAGOGASTRODUODENOSCOPY (EGD) WITH PROPOFOL (N/A) as a surgical intervention .  The patient's history has been reviewed, patient examined, no change in status, stable for surgery.  I have reviewed the patient's chart and labs.  Questions were answered to the patient's satisfaction.     Milus Banister

## 2015-12-18 NOTE — Anesthesia Preprocedure Evaluation (Addendum)
Anesthesia Evaluation  Patient identified by MRN, date of birth, ID band Patient awake    Reviewed: Allergy & Precautions, NPO status , Patient's Chart, lab work & pertinent test results, reviewed documented beta blocker date and time   History of Anesthesia Complications Negative for: history of anesthetic complications  Airway Mallampati: II  TM Distance: >3 FB Neck ROM: Full    Dental  (+) Dental Advisory Given   Pulmonary former smoker,    breath sounds clear to auscultation       Cardiovascular hypertension, Pt. on medications and Pt. on home beta blockers (-) angina+ CAD, + Past MI, + CABG and + Peripheral Vascular Disease  + pacemaker  Rhythm:Regular Rate:Normal  10/17 ECHO:  EF 55-60%,mod AI, mild TR   Neuro/Psych  Headaches, Anxiety Depression    GI/Hepatic Neg liver ROS, GI bleed   Endo/Other  negative endocrine ROS  Renal/GU negative Renal ROS     Musculoskeletal  (+) Arthritis ,   Abdominal   Peds  Hematology  (+) Blood dyscrasia (Hb 7.7), anemia , plavix and xarelto   Anesthesia Other Findings   Reproductive/Obstetrics                            Anesthesia Physical Anesthesia Plan  ASA: III  Anesthesia Plan: MAC   Post-op Pain Management:    Induction:   Airway Management Planned: Natural Airway and Nasal Cannula  Additional Equipment:   Intra-op Plan:   Post-operative Plan:   Informed Consent: I have reviewed the patients History and Physical, chart, labs and discussed the procedure including the risks, benefits and alternatives for the proposed anesthesia with the patient or authorized representative who has indicated his/her understanding and acceptance.   Dental advisory given  Plan Discussed with: CRNA and Surgeon  Anesthesia Plan Comments: (Plan routine monitors, MAC)        Anesthesia Quick Evaluation

## 2015-12-18 NOTE — Anesthesia Postprocedure Evaluation (Signed)
Anesthesia Post Note  Patient: [name]  Procedure(s) Performed: Procedure(s) (LRB): ESOPHAGOGASTRODUODENOSCOPY (EGD) WITH PROPOFOL (N/A)  Patient location during evaluation: Endoscopy Anesthesia Type: MAC Level of consciousness: oriented, patient cooperative and awake and alert Pain management: pain level controlled Vital Signs Assessment: post-procedure vital signs reviewed and stable Respiratory status: spontaneous breathing, nonlabored ventilation, respiratory function stable and patient connected to nasal cannula oxygen Cardiovascular status: blood pressure returned to baseline and stable Postop Assessment: no signs of nausea or vomiting Anesthetic complications: no    Last Vitals:  Vitals:   12/18/15 1310 12/18/15 1329  BP: (!) 98/37 (!) 100/36  Pulse:    Resp:    Temp:  36.7 C    Last Pain:  Vitals:   12/18/15 1329  TempSrc: Oral  PainSc:                  [name],E. Tyreanna Bisesi

## 2015-12-19 ENCOUNTER — Encounter (HOSPITAL_COMMUNITY): Payer: Self-pay | Admitting: Gastroenterology

## 2015-12-19 ENCOUNTER — Encounter (HOSPITAL_COMMUNITY): Payer: Self-pay | Admitting: Certified Registered Nurse Anesthetist

## 2015-12-19 LAB — TYPE AND SCREEN
ABO/RH(D): A POS
Antibody Screen: NEGATIVE
Unit division: 0
Unit division: 0

## 2015-12-19 LAB — CBC
HCT: 29.2 % — ABNORMAL LOW (ref 36.0–46.0)
Hemoglobin: 9.9 g/dL — ABNORMAL LOW (ref 12.0–15.0)
MCH: 28.9 pg (ref 26.0–34.0)
MCHC: 33.9 g/dL (ref 30.0–36.0)
MCV: 85.1 fL (ref 78.0–100.0)
Platelets: 183 10*3/uL (ref 150–400)
RBC: 3.43 MIL/uL — ABNORMAL LOW (ref 3.87–5.11)
RDW: 17.2 % — ABNORMAL HIGH (ref 11.5–15.5)
WBC: 6.4 10*3/uL (ref 4.0–10.5)

## 2015-12-19 LAB — GLUCOSE, CAPILLARY
Glucose-Capillary: 106 mg/dL — ABNORMAL HIGH (ref 65–99)
Glucose-Capillary: 109 mg/dL — ABNORMAL HIGH (ref 65–99)

## 2015-12-19 LAB — MAGNESIUM: Magnesium: 2 mg/dL (ref 1.7–2.4)

## 2015-12-19 LAB — HEPARIN LEVEL (UNFRACTIONATED)
Heparin Unfractionated: 0.42 IU/mL (ref 0.30–0.70)
Heparin Unfractionated: 0.5 IU/mL (ref 0.30–0.70)

## 2015-12-19 LAB — COMPREHENSIVE METABOLIC PANEL
ALT: 16 U/L (ref 14–54)
AST: 24 U/L (ref 15–41)
Albumin: 3.5 g/dL (ref 3.5–5.0)
Alkaline Phosphatase: 62 U/L (ref 38–126)
Anion gap: 2 — ABNORMAL LOW (ref 5–15)
BUN: 30 mg/dL — ABNORMAL HIGH (ref 6–20)
CO2: 24 mmol/L (ref 22–32)
Calcium: 9.7 mg/dL (ref 8.9–10.3)
Chloride: 109 mmol/L (ref 101–111)
Creatinine, Ser: 0.97 mg/dL (ref 0.44–1.00)
GFR calc Af Amer: >60 mL/min (ref >60)
GFR calc non Af Amer: 56 mL/min — ABNORMAL LOW (ref >60)
Glucose, Bld: 118 mg/dL — ABNORMAL HIGH (ref 65–99)
Potassium: 4.1 mmol/L (ref 3.5–5.1)
Sodium: 135 mmol/L (ref 135–145)
Total Bilirubin: 1.3 mg/dL — ABNORMAL HIGH (ref 0.3–1.2)
Total Protein: 5.9 g/dL — ABNORMAL LOW (ref 6.5–8.1)

## 2015-12-19 LAB — HEMOGLOBIN AND HEMATOCRIT, BLOOD
HCT: 28.2 % — ABNORMAL LOW (ref 36.0–46.0)
Hemoglobin: 9.5 g/dL — ABNORMAL LOW (ref 12.0–15.0)

## 2015-12-19 LAB — HEMOGLOBIN A1C
Hgb A1c MFr Bld: 5.5 % (ref 4.8–5.6)
Mean Plasma Glucose: 111 mg/dL

## 2015-12-19 LAB — PHOSPHORUS: Phosphorus: 2.9 mg/dL (ref 2.5–4.6)

## 2015-12-19 MED ORDER — PEG-KCL-NACL-NASULF-NA ASC-C 100 G PO SOLR
0.5000 | Freq: Once | ORAL | Status: DC
  Filled 2015-12-19: qty 1

## 2015-12-19 MED ORDER — PEG-KCL-NACL-NASULF-NA ASC-C 100 G PO SOLR: 0.5000 | Freq: Once | ORAL | Status: DC

## 2015-12-19 MED ORDER — PEG-KCL-NACL-NASULF-NA ASC-C 100 G PO SOLR
0.5000 | Freq: Once | ORAL | Status: AC
  Administered 2015-12-20: 100 g via ORAL
  Filled 2015-12-19: qty 1

## 2015-12-19 MED ORDER — PEG-KCL-NACL-NASULF-NA ASC-C 100 G PO SOLR
0.5000 | Freq: Once | ORAL | Status: AC
  Administered 2015-12-20: 100 g via ORAL

## 2015-12-19 MED ORDER — PEG-KCL-NACL-NASULF-NA ASC-C 100 G PO SOLR: 1.0000 | Freq: Once | ORAL | Status: DC

## 2015-12-19 NOTE — Progress Notes (Signed)
PROGRESS NOTE    [name]  VEH:[id number] DOB: January 23, 1941 DOA: 12/16/2015 PCP: [name], MD  Brief Narrative:  [name] is a 75 y.o. female with history of CAD status post CABG and stents placement recently in March 2017, Hypertension, paroxysmal atrial fibrillation and other comorbidites who presented to the ER because of loss of consciousness. Patient was recently admitted twice for uncontrolled blood pressure and syncopal episode was brought to the ER after patient had syncopal episode last night around 9 PM while walking to the bathroom. Patient states her granddaughter helped her to the floor but she did loose consciousness briefly. Did not have any incontinence of bowel or urine. Denied any chest pain or shortness of breath. Since last discharge patient has been very careful in getting out of the bed due to dizziness. Patient accidentally took hydrochlorothiazide yesterday. In the ER patient was found to have a hemoglobin of 9 which is 3 g drop from previous and patient states she also noticed black stools last 2 days. Stool for occult blood is positive. Patient states while patient was having the syncopal episode she may have caught her leg into the cabinet and patient has pain in the right foot which x-rays show has dislocated right second toe. ER physician was unable to fix it. Orthopedics was consulted for dislocation of toe. She underwent an EGD yesterday by GI and Plan is for Colonoscopy on Saturday. Cardiology also consulted for management of Atrial Fibrillation.   Assessment & Plan:   Principal Problem:   Syncope Active Problems:   Essential hypertension   PAF (paroxysmal atrial fibrillation) (HCC)   S/P placement of cardiac pacemaker, medtronic adapta 01/21/12   PVD (peripheral vascular disease), hx stents to bil SFAs 02/2010   CAD (coronary artery disease)   S/P CABG x 3 01/04/15   Febrile illness, acute   Paroxysmal atrial flutter (HCC)   Chronic diastolic heart  failure (HCC)   Hypercalcemia   Gastrointestinal hemorrhage   Acute blood loss anemia   CKD (chronic kidney disease), stage II  Acute GI Bleed s/p transfusion of 2 units of pRBC -Denies taking any NSAIDs. Continue Holding Xarelto and Plavix for now.  -Follow CBC. -Gastroenterology Dr. Dutch Quint and EGD done yesterday -EGD done showed normal esophagus, normal stomach, and normal examined duodenem; Patient has a Hx of Diverticulitis.  -Patient's CBC showed Hb/Hct 7.7/23.2 yesterday;-Was Transfused 2 Units of pRBC and now Hb/Hct increased to 9.9/29.2 -C/w Protonix gtt discontined and patient now on Pantoprazole 40 mg Daily -Will need Colonoscopy and will target December 21, 2015 as patient's last Plavix dose was 11/6 -PER GI ok to resume Xarelto and Hold Friday November 10th in anticipation for Colonoscopy November 11th. Discussed with Cardiology Dr. Tamala Julian and will start Patient on Heparin gtt for better control of her Anticoagulation. Will D/C at [facility] in anticipation of Colonoscopy.   -Repeat H/H this evening.  -Per GI Continue Regular Diet today and then Clears and NPO after midnight Tomorrow -GI will start Bowel Prep tomorrow  Hypotension in the setting of likely Acute Upper GI Bleed -improved.  -Has a Hx of Hypertension -Cardiology decreased Lisinopril from 30 mg daily to 10 mg daily and will also decrease Carvedilol to 3.125 mg BID.  -Is s/p Bolus NS 500 mL over 2 Hours and Transfusion of 2 units of pRBC -Continue to Monitor VS  Syncope likely 2/2 to Volume Loss and Orthostasis -Cards and GI following -S/p transfusion of 2 units of pRBC -C/w Telemetry;  If reoccurs will obtain Echocardiogram and Orthostatic Vital Signs  Right Foot Second Toe Dislocation -Orthopedics Dr. Herbert Seta and performed Closed Reduction and Buddy Tape with Cast Shoe as needed -Will Follow up As an Outpatient and have Repeat Imaging at Office  CAD status post CABG and recent stents  -s/p  CABG 1 year ago -Cardiology following -Held Plavix. Discussed with Cardiology about Xarelto and will start Heparin gtt instead of Xarelto for better control. Talked with patient and she agrees to start Heparin gtt.  -Had CP this Am. Cardiology Recommends SL NTG as patient had Anginal Equivalent. -C/w Carvedilol 3.125 mg po BID and with Lisinopril 10 mg daily.   Paroxysmal Atrial fibrillation/ Atrial Flutter -Renally dosed Xarelto on hold due to GI bleed. PER GI ok to restart. Discussed with Cardiology Dr. Tamala Julian and will Anticoagulate patient with Heparin gtt instead of Xarelto for better control. Continue to Monitor  -CHADS2VASC elevated at 6 -C/w Amiodarone 100 mg daily and Coreg 3.125 mg po BID -C/w Telemetry  Acute Blood Loss Anemia s/p Transfusion of 2 Units of pRBC -S/p Transfusion of 2 units of pRBC and follow CBC. -Repeat H/H this evening   Hypercalcemia, improved -Improved to 9.7 -Suspect likely due to dehydration and HCTZ.  -Continue to Follow Metabolic Panel  Anxiety/Depression -Continue with Citalopram 10 mg Daily  DVT prophylaxis: SCDs; Heparin gtt Code Status: Do Not Intubate Family Communication: Discussed Plan of Care with patient and patient's son. Disposition Plan: Pending PT Evaluation  Consultants:  Cardiology Gastroenterology Orthopedic Surgery  Procedures: EGD today; Colonoscopy 12/21/15  Antimicrobials: None  Subjective: Seen and examined at bedside and she was feeling much better after receiving the blood. Was concerned about her BP medications and discussed she needed some medications for her heart and that the dose was cut by Cardiology. No CP/SOB/N/V or Lightheadedness or dizziness. No other concerns or complaints at this time.   Objective: Vitals:   12/19/15 0303 12/19/15 0649 12/19/15 0807 12/19/15 0959  BP: (!) 124/51 (!) 148/66 (!) 148/66 (!) 96/49  Pulse: 75 75  76  Resp: '16 16  16  ' Temp: 98.1 F (36.7 C) 98 F (36.7 C)  97.6 F (36.4 C)   TempSrc: Oral Oral  Oral  SpO2: 99% 99%  100%  Weight:      Height:        Intake/Output Summary (Last 24 hours) at 12/19/15 1219 Last data filed at 12/19/15 0900  Gross per 24 hour  Intake           2253.5 ml  Output                0 ml  Net           2253.5 ml   Filed Weights   12/17/15 0248  Weight: 61.6 kg (135 lb 12.8 oz)   Examination: Physical Exam:  Constitutional: WN/WD, NAD Eyes: Lids normal and conjunctival pallor improved. sclerae anicteric  ENMT: External Ears, Nose appear normal. Grossly normal hearing. Mucous membranes appeared moist.  Neck: Appears normal, supple, no cervical masses, normal ROM, no appreciable thyromegaly, no JVD Respiratory: Clear to auscultation bilaterally, no wheezing, rales, rhonchi or crackles. Normal respiratory effort and patient is not tachypenic. No accessory muscle use.  Cardiovascular: RRR, no murmurs / rubs / gallops. S1 and S2 auscultated. No extremity edema. 2+ pedal pulses.   Abdomen: Soft, non-tender, non-distended. No masses palpated. No appreciable hepatosplenomegaly. Bowel sounds positive x4.  GU: Deferred. Musculoskeletal: Mild Weakness in Upper and  Lower Extremities.  Skin: No rashes, lesions, ulcers. No induration; Warm and dry.  Neurologic: CN 2-12 grossly intact with no focal deficits. Sensation intact in all 4 Extremities. Romberg sign cerebellar reflexes not assessed.  Psychiatric: Normal judgment and insight. Alert and oriented x 3. Normal mood and pleasant affect.   Data Reviewed: I have personally reviewed following labs and imaging studies  CBC:  Recent Labs Lab 12/17/15 0647 12/17/15 1050 12/17/15 1404 12/18/15 0456 12/19/15 0451  WBC 5.9 7.6 7.6 5.9 6.4  NEUTROABS  --   --   --  2.7  --   HGB 8.9* 8.6* 8.4* 7.7* 9.9*  HCT 26.9* 25.7* 25.0* 23.2* 29.2*  MCV 84.1 82.6 82.5 85.3 85.1  PLT 237 230 232 227 450   Basic Metabolic Panel:  Recent Labs Lab 12/16/15 2343 12/17/15 0647 12/18/15 0456  12/19/15 0451  NA 135 137 137 135  K 3.7 3.8 4.6 4.1  CL 104 104 105 109  CO2 '27 27 27 24  ' GLUCOSE 147* 127* 110* 118*  BUN 51* 45* 35* 30*  CREATININE 1.00 0.91 0.86 0.97  CALCIUM 11.0* 10.4* 10.3 9.7  MG  --   --   --  2.0  PHOS  --   --   --  2.9   GFR: Estimated Creatinine Clearance: 43.3 mL/min (by C-G formula based on SCr of 0.97 mg/dL). Liver Function Tests:  Recent Labs Lab 12/17/15 0647 12/19/15 0451  AST 19 24  ALT 15 16  ALKPHOS 70 62  BILITOT 0.6 1.3*  PROT 5.8* 5.9*  ALBUMIN 3.4* 3.5   No results for input(s): LIPASE, AMYLASE in the last 168 hours. No results for input(s): AMMONIA in the last 168 hours. Coagulation Profile:  Recent Labs Lab 12/18/15 1710  INR 0.97   Cardiac Enzymes:  Recent Labs Lab 12/17/15 0647  TROPONINI <0.03   BNP (last 3 results) No results for input(s): PROBNP in the last 8760 hours. HbA1C:  Recent Labs  12/18/15 0456  HGBA1C 5.5   CBG:  Recent Labs Lab 12/17/15 2348 12/18/15 0745 12/18/15 1604 12/18/15 2314 12/19/15 0805  GLUCAP 98 95 90 104* 106*   Lipid Profile: No results for input(s): CHOL, HDL, LDLCALC, TRIG, CHOLHDL, LDLDIRECT in the last 72 hours. Thyroid Function Tests: No results for input(s): TSH, T4TOTAL, FREET4, T3FREE, THYROIDAB in the last 72 hours. Anemia Panel: No results for input(s): VITAMINB12, FOLATE, FERRITIN, TIBC, IRON, RETICCTPCT in the last 72 hours. Sepsis Labs: No results for input(s): PROCALCITON, LATICACIDVEN in the last 168 hours.  No results found for this or any previous visit (from the past 240 hour(s)).   Radiology Studies: Dg Toe 2nd Right  Result Date: 12/17/2015 CLINICAL DATA:  Right second toe dislocation status post reduction EXAM: RIGHT SECOND TOE COMPARISON:  Film from earlier in the same day FINDINGS: The reduction of the PIP joint has been performed. Anatomic alignment is now well exchanged. A tiny bony density is again seen adjacent to the distal aspect of  the proximal phalanx which may be related to a small avulsion. No other focal abnormality is noted. IMPRESSION: Status post reduction with possible small avulsion. Electronically Signed   By: Inez Catalina M.D.   On: 12/17/2015 17:13   Scheduled Meds: . amiodarone  100 mg Oral Daily  . bupivacaine (PF)  10-30 mL Infiltration Once  . carvedilol  3.125 mg Oral BID WC  . citalopram  10 mg Oral Daily  . Influenza vac split quadrivalent PF  0.5  mL Intramuscular Tomorrow-1000  . lidocaine  10-50 mL Intradermal Once  . lisinopril  10 mg Oral Daily  . pantoprazole  40 mg Oral Daily  . peg 3350 powder  0.5 kit Oral Once   And  . peg 3350 powder  0.5 kit Oral Once   Continuous Infusions: . heparin 850 Units/hr (12/18/15 1800)    LOS: 2 days   Kerney Elbe, DO [facility] Pager [phone]  If 7PM-7AM, please contact night-coverage [email or web address] Password [email or web address] 12/19/2015, 12:19 PM

## 2015-12-19 NOTE — Progress Notes (Signed)
ANTICOAGULATION CONSULT NOTE - Follow Up Consult  Pharmacy Consult for Heparin Indication: atrial fibrillation  Allergies  Allergen Reactions  . Brilinta [Ticagrelor] Shortness Of Breath  . Clonidine Derivatives Other (See Comments)    Lowers heart rate  . Atorvastatin Other (See Comments)    Possible cause of fatigue/malaise  . Crestor [Rosuvastatin Calcium] Other (See Comments)    Joint/muscle aches  . Spironolactone     Contraindicated with history of hyperkalemia  . Exforge [Amlodipine Besylate-Valsartan] Itching and Rash    Patient Measurements: Height: 5\' 4"  (162.6 cm) Weight: 135 lb 12.8 oz (61.6 kg) IBW/kg (Calculated) : 54.7 Heparin Dosing Weight:   Vital Signs: Temp: 98.1 F (36.7 C) (11/09 0303) Temp Source: Oral (11/09 0303) BP: 124/51 (11/09 0303) Pulse Rate: 75 (11/09 0303)  Labs:  Recent Labs  12/17/15 0647  12/17/15 1404 12/18/15 0456 12/18/15 1710 12/19/15 0451  HGB 8.9*  < > 8.4* 7.7*  --  9.9*  HCT 26.9*  < > 25.0* 23.2*  --  29.2*  PLT 237  < > 232 227  --  183  APTT  --   --   --   --  20*  --   LABPROT  --   --   --   --  12.9  --   INR  --   --   --   --  0.97  --   HEPARINUNFRC  --   --   --   --  <0.10* 0.42  CREATININE 0.91  --   --  0.86  --  0.97  TROPONINI <0.03  --   --   --   --   --   < > = values in this interval not displayed.  Estimated Creatinine Clearance: 43.3 mL/min (by C-G formula based on SCr of 0.97 mg/dL).   Medications:  Infusions:  . heparin 850 Units/hr (12/18/15 1800)    Assessment: Patient with heparin level at goal.  No heparin issues noted.  Goal of Therapy:  Heparin level 0.3-0.7 units/ml Monitor platelets by anticoagulation protocol: Yes   Plan:  Continue heparin drip at current rate Recheck level at [place], [place] 12/19/2015,6:37 AM

## 2015-12-19 NOTE — Progress Notes (Signed)
Progress Note   Subjective  Chief Complaint:Melena, symptomatic anemia  Patient found sitting up in bed today, she tells me that she had another black stool this morning. She is aware that EGD was unremarkable and plans are for colonoscopy on Saturday. Denies any new complaints or concerns. In fact she feels much better, she tells me that she "didn't even get dizzy when she stood up to use the bathroom".   Objective   Vital signs in last 24 hours: Temp:  [97.5 F (36.4 C)-98.2 F (36.8 C)] 97.6 F (36.4 C) (11/09 0959) Pulse Rate:  [73-86] 76 (11/09 0959) Resp:  [1-20] 16 (11/09 0959) BP: (78-148)/(30-66) 96/49 (11/09 0959) SpO2:  [90 %-100 %] 100 % (11/09 0959) Last BM Date:  (12/18/2015) General: Elderly Caucasian female in NAD Heart:  Regular rate and rhythm; no murmurs Lungs: Respirations even and unlabored, lungs CTA bilaterally Abdomen:  Soft, nontender and nondistended. Normal bowel sounds. Extremities:  Without edema. Neurologic:  Alert and oriented,  grossly normal neurologically. Psych:  Cooperative. Normal mood and affect.  Intake/Output from previous day: 11/08 0701 - 11/09 0700 In: 1773.5 [P.O.:720; I.V.:310.5; Blood:743] Out: 200 [Urine:200] Intake/Output this shift: Total I/O In: 480 [P.O.:480] Out: -   Lab Results:  Recent Labs  12/17/15 1404 12/18/15 0456 12/19/15 0451  WBC 7.6 5.9 6.4  HGB 8.4* 7.7* 9.9*  HCT 25.0* 23.2* 29.2*  PLT 232 227 183   BMET  Recent Labs  12/17/15 0647 12/18/15 0456 12/19/15 0451  NA 137 137 135  K 3.8 4.6 4.1  CL 104 105 109  CO2 27 27 24   GLUCOSE 127* 110* 118*  BUN 45* 35* 30*  CREATININE 0.91 0.86 0.97  CALCIUM 10.4* 10.3 9.7   LFT  Recent Labs  12/19/15 0451  PROT 5.9*  ALBUMIN 3.5  AST 24  ALT 16  ALKPHOS 62  BILITOT 1.3*   PT/INR  Recent Labs  12/18/15 1710  LABPROT 12.9  INR 0.97    Studies/Results: Dg Toe 2nd Right  Result Date: 12/17/2015 CLINICAL DATA:  Right second toe  dislocation status post reduction EXAM: RIGHT SECOND TOE COMPARISON:  Film from earlier in the same day FINDINGS: The reduction of the PIP joint has been performed. Anatomic alignment is now well exchanged. A tiny bony density is again seen adjacent to the distal aspect of the proximal phalanx which may be related to a small avulsion. No other focal abnormality is noted. IMPRESSION: Status post reduction with possible small avulsion. Electronically Signed   By: Inez Catalina M.D.   On: 12/17/2015 17:13   EGD 12/18/15-Dr. Ardis Hughs Impression:               - Normal esophagus.                           - Normal stomach.                           - Normal examined duodenum.                           - No specimens collected. Recommendation:           - Return patient to hospital ward for ongoing care.                           -  Resume previous diet.                           - Continue present medications.                           - Will need to proceed with colonoscopy. Her last                            plavix dose was on 11/6. 5 day plavix washout is                            preferred before colonoscopy; that would make it                            safe for colonoscopy on Saturday November 11.                            Xarelto has a quicker washout. She can resume that                            today given high risk heart disease and then will                            need to not take it on Friday November 10th. For                            now, cardiac diet and observe for further bleeding.   Assessment / Plan:   Assessment: 1. Upper GI bleed:Patient reports "black sticky" stools since the end of October on a daily basis, episode of syncope 11/15/15, she blames on her blood pressure medications, no history of upper GI symptoms; EGD 12/18/15 unremarkable, plans for colonoscopy Saturday to allow washout of Plavix 2. Right foot second toe dislocation: Being followed by orthopedics 3. CAD  status post CABG and recent stent placed in March: Maintained on Plavix, held currently, last dose yesterday 8am 4. A. fib and pacemaker placement: Xarelto will be held tomorrow morning 5. Acute blood loss anemia: The patient's hemoglobin was 12.3 10 days ago, now 9.9 after 1 uprbcs last night  Plan: 1. Continue monitoring hgb with transfusion as necessary 2. Planning for Colonoscopy Saturday  3. Patient may continue regular diet today and then clear tomorrow and NPO after midnight tomorrow 4. Will start prep tomorrow- will need to hold Heparin 6 hours prior to procedure on Saturday 5. Hold Xarelto dose tomorrow morning and continue to hold Plavix. 6. Will discuss above with Dr. Ardis Hughs, please await any further recs  Thank you for your kind consultation, we will continue to follow.   LOS: 2 days   Levin Erp  12/19/2015, 10:34 AM  Pager # [phone]   ________________________________________________________________________  Velora Heckler GI MD note:  I agree with the assessment and plan described above.   Owens Loffler, MD [facility] Pager [phone]

## 2015-12-19 NOTE — Progress Notes (Signed)
[place] for IV heparin Indication: atrial fibrillation  Allergies  Allergen Reactions  . Brilinta [Ticagrelor] Shortness Of Breath  . Clonidine Derivatives Other (See Comments)    Lowers heart rate  . Atorvastatin Other (See Comments)    Possible cause of fatigue/malaise  . Crestor [Rosuvastatin Calcium] Other (See Comments)    Joint/muscle aches  . Spironolactone     Contraindicated with history of hyperkalemia  . Exforge [Amlodipine Besylate-Valsartan] Itching and Rash    Patient Measurements: Height: '5\' 4"'  (162.6 cm) Weight: 135 lb 12.8 oz (61.6 kg) IBW/kg (Calculated) : 54.7 Heparin Dosing Weight: 62 kg (TBW)  Vital Signs: Temp: 97.6 F (36.4 C) (11/09 0959) Temp Source: Oral (11/09 0959) BP: 96/49 (11/09 0959) Pulse Rate: 76 (11/09 0959)  Labs:  Recent Labs  12/17/15 0647  12/17/15 1404 12/18/15 0456 12/18/15 1710 12/19/15 0451 12/19/15 1321  HGB 8.9*  < > 8.4* 7.7*  --  9.9*  --   HCT 26.9*  < > 25.0* 23.2*  --  29.2*  --   PLT 237  < > 232 227  --  183  --   APTT  --   --   --   --  20*  --   --   LABPROT  --   --   --   --  12.9  --   --   INR  --   --   --   --  0.97  --   --   HEPARINUNFRC  --   --   --   --  <0.10* 0.42 0.50  CREATININE 0.91  --   --  0.86  --  0.97  --   TROPONINI <0.03  --   --   --   --   --   --   < > = values in this interval not displayed.  Estimated Creatinine Clearance: 43.3 mL/min (by C-G formula based on SCr of 0.97 mg/dL).   Medical History: Past Medical History:  Diagnosis Date  . ANXIETY 12/31/2009  . Aortic insufficiency    a. mod by echo 2017.  [place] CAD (coronary artery disease)    a. s/p CABGx3 and LAA clipping in 12/2014, NSTEMI 05/2015 s/p DES to native RCA and SVG-dRCA; occluded ramus-SVG was treated medically). c. neg nuc 10/2015 at [facility].  . Chronic diastolic CHF (congestive heart failure) ([place])   . Chronic fatigue   . CKD (chronic kidney disease), stage II   .  DEPRESSION 12/31/2009  . [place] DISEASE, CERVICAL 12/31/2009  . [place] DISEASE, LUMBAR 12/31/2009  . DIVERTICULITIS, HX OF 12/31/2009  . Essential hypertension   . GLUCOSE INTOLERANCE 12/31/2009  . Habitual alcohol use   . Hypercalcemia   . Hyperkalemia   . Hyperlipidemia 12/31/2009  . MENOPAUSE, EARLY 12/31/2009  . NSTEMI (non-ST elevated myocardial infarction) ([place]) 05/11/2015  . Orthostatic hypotension   . OSTEOARTHRITIS, HAND   . Pacemaker 01/21/2012   MDT Adapta dual chamber  . Paroxysmal atrial flutter ([place])   . Persistent atrial fibrillation ([place])   . Pre-diabetes   . PVD (peripheral vascular disease), hx stents to bil SFAs 02/2010   . Symptomatic sinus bradycardia 01/22/2012  . Syncope    a. 11/2015 ? due to medications.  . Tachy-brady syndrome ([place])    a. s/p MDT PPM 2013.  . Tricuspid regurgitation     Medications:  Prescriptions Prior to Admission  Medication Sig Dispense Refill Last Dose  . amiodarone (PACERONE)  100 MG tablet Take 1 tablet (100 mg total) by mouth daily. (Patient taking differently: Take 100 mg by mouth every morning. ) 30 tablet 11 12/16/2015 at Unknown time  . carvedilol (COREG) 6.25 MG tablet Take 1 tablet (6.25 mg total) by mouth 2 (two) times daily with a meal. 60 tablet 0 12/16/2015 at 0900  . cholecalciferol (VITAMIN D) 1000 UNITS tablet Take 1,000 Units by mouth at bedtime.    12/15/2015 at Unknown time  . citalopram (CELEXA) 20 MG tablet Take 0.5 tablets (10 mg total) by mouth daily. (Patient taking differently: Take 10 mg by mouth every morning. )   12/16/2015 at Unknown time  . clopidogrel (PLAVIX) 75 MG tablet TAKE 1 TABLET (75 MG TOTAL) BY MOUTH DAILY. (Patient taking differently: Take 75 mg by mouth every morning. ) 90 tablet 1 12/16/2015 at 0900  . Coenzyme Q10 (COQ10 PO) Take 120 mg by mouth at bedtime.    12/15/2015 at Unknown time  . hydrochlorothiazide (HYDRODIURIL) 25 MG tablet Take 25 mg by mouth once.   12/16/2015 at 0900  . lisinopril  (PRINIVIL,ZESTRIL) 30 MG tablet Take 1 tablet (30 mg total) by mouth daily. (Patient taking differently: Take 30 mg by mouth every morning. ) 30 tablet 0 12/16/2015 at Unknown time  . Multiple Vitamin (MULTIVITAMIN) capsule Take 1 capsule by mouth 2 (two) times daily.    12/16/2015 at Unknown time  . nitroGLYCERIN (NITROSTAT) 0.4 MG SL tablet Place 1 tablet (0.4 mg total) under the tongue every 5 (five) minutes x 3 doses as needed for chest pain. 25 tablet 2 Past Week at Unknown time  . rivaroxaban (XARELTO) 15 MG TABS tablet Take 1 tablet (15 mg total) by mouth daily with supper. 30 tablet 5 12/16/2015 at 1800   Scheduled:  . amiodarone  100 mg Oral Daily  . bupivacaine (PF)  10-30 mL Infiltration Once  . carvedilol  3.125 mg Oral BID WC  . citalopram  10 mg Oral Daily  . Influenza vac split quadrivalent PF  0.5 mL Intramuscular Tomorrow-1000  . lidocaine  10-50 mL Intradermal Once  . lisinopril  10 mg Oral Daily  . pantoprazole  40 mg Oral Daily  . [START ON 12/20/2015] peg 3350 powder  0.5 kit Oral Once   And  . [START ON 12/20/2015] peg 3350 powder  0.5 kit Oral Once    Assessment: 75 y.o. female with past medical history of CAD status post CABG and stent placement in March 2017 maintained on Plavix and Xarelto, hypertension, paroxysmal A. fib, PVD, aortic insufficiency and tricuspid regurgitation and presented to the ER due to LOC. Hemoccult positive and patient reports melena, but EGD without findings. Plavix and Xarelto held in anticipation of colonoscopy on 11/11. Cardiology would like to initiate heparin given high CHADS2VASc score = 6   Baseline INR, aPTT, HL: pending (INR 1.41 on 10/26 - consistent with Xarelto use)  Prior anticoagulation: Xarelto (LD 11/6 at 1800), Plavix (LD 11/6 at 0900)  Significant events: 11/8: ordered 2 units PRBC  Today, 12/19/2015:  Repeat (confirmatory) heparin level now back therapeutic at 0.50  hgb improves to 9.9 s/p PRBC x 2 on 11/8, plt  183  Patient reported a black stool this morning  Colonoscopy is scheduled for 12/21/15 with GI team indicates plan to d/c heparin drip 6 hours prior to procedure on Saturday  Goal of Therapy: Heparin level 0.3-0.7 units/ml Monitor platelets by anticoagulation protocol: Yes  Plan:  Heparin 850 units/hr IV infusion  Monitor  for signs of bleeding   Dia Sitter, PharmD, BCPS 12/19/2015 2:21 PM

## 2015-12-19 NOTE — Progress Notes (Signed)
Patient Name: [name] Date of Encounter: 12/19/2015  Primary Cardiologist: Dr. Delila Pereyra Problem List     Principal Problem:   Syncope Active Problems:   Essential hypertension   PAF (paroxysmal atrial fibrillation) (HCC)   S/P placement of cardiac pacemaker, medtronic adapta 01/21/12   PVD (peripheral vascular disease), hx stents to bil SFAs 02/2010   CAD (coronary artery disease)   S/P CABG x 3 01/04/15   Febrile illness, acute   Paroxysmal atrial flutter (HCC)   Chronic diastolic heart failure (HCC)   Hypercalcemia   Gastrointestinal hemorrhage   Acute blood loss anemia   CKD (chronic kidney disease), stage II    Subjective   Denies any repeat episodes of chest discomfort. Dizziness improved following blood transfusion.   Inpatient Medications    Scheduled Meds: . amiodarone  100 mg Oral Daily  . bupivacaine (PF)  10-30 mL Infiltration Once  . carvedilol  3.125 mg Oral BID WC  . citalopram  10 mg Oral Daily  . Influenza vac split quadrivalent PF  0.5 mL Intramuscular Tomorrow-1000  . lidocaine  10-50 mL Intradermal Once  . lisinopril  10 mg Oral Daily  . pantoprazole  40 mg Oral Daily  . [START ON 12/20/2015] peg 3350 powder  0.5 kit Oral Once   And  . [START ON 12/20/2015] peg 3350 powder  0.5 kit Oral Once   Continuous Infusions: . heparin 850 Units/hr (12/18/15 1800)   PRN Meds: acetaminophen **OR** acetaminophen, acetaminophen, nitroGLYCERIN, ondansetron **OR** ondansetron (ZOFRAN) IV   Vital Signs    Vitals:   12/19/15 0303 12/19/15 0649 12/19/15 0807 12/19/15 0959  BP: (!) 124/51 (!) 148/66 (!) 148/66 (!) 96/49  Pulse: 75 75  76  Resp: _0 Temp: 98.1 F (36.7 C) 98 F (36.7 C)  97.6 F (36.4 C)  TempSrc: Oral Oral  Oral  SpO2: 99% 99%  100%  Weight:      Height:        Intake/Output Summary (Last 24 hours) at 12/19/15 1257 Last data filed at 12/19/15 0900  Gross per 24 hour  Intake           2053.5 ml  Output                 0 ml  Net           2053.5 ml   Filed Weights   12/17/15 0248  Weight: 135 lb 12.8 oz (61.6 kg)    Physical Exam    GEN: Well nourished, well developed Caucasian female appearing in no acute distress.  HEENT: Grossly normal.  Neck: Supple, no JVD, carotid bruits, or masses. Cardiac: RRR, no murmurs, rubs, or gallops. No clubbing, cyanosis, edema.  Radials/DP/PT 2+ and equal bilaterally.  Respiratory:  Respirations regular and unlabored, clear to auscultation bilaterally. GI: Soft, nontender, nondistended, BS + x 4. MS: no deformity or atrophy. Skin: warm and dry, no rash. Neuro:  Strength and sensation are intact. Psych: AAOx3.  Normal affect.  Labs    CBC  Recent Labs  12/18/15 0456 12/19/15 0451  WBC 5.9 6.4  NEUTROABS 2.7  --   HGB 7.7* 9.9*  HCT 23.2* 29.2*  MCV 85.3 85.1  PLT 227 921   Basic Metabolic Panel  Recent Labs  12/18/15 0456 12/19/15 0451  NA 137 135  K 4.6 4.1  CL 105 109  CO2 27 24  GLUCOSE 110* 118*  BUN 35* 30*  CREATININE  0.86 0.97  CALCIUM 10.3 9.7  MG  --  2.0  PHOS  --  2.9   Liver Function Tests  Recent Labs  12/17/15 0647 12/19/15 0451  AST 19 24  ALT 15 16  ALKPHOS 70 62  BILITOT 0.6 1.3*  PROT 5.8* 5.9*  ALBUMIN 3.4* 3.5   No results for input(s): LIPASE, AMYLASE in the last 72 hours. Cardiac Enzymes  Recent Labs  12/17/15 0647  TROPONINI <0.03   BNP Invalid input(s): POCBNP D-Dimer No results for input(s): DDIMER in the last 72 hours. Hemoglobin A1C  Recent Labs  12/18/15 0456  HGBA1C 5.5   Fasting Lipid Panel No results for input(s): CHOL, HDL, LDLCALC, TRIG, CHOLHDL, LDLDIRECT in the last 72 hours. Thyroid Function Tests No results for input(s): TSH, T4TOTAL, T3FREE, THYROIDAB in the last 72 hours.  Invalid input(s): FREET3  Telemetry    A-paced, HR in 70's. No atopic events- Personally Reviewed  ECG    No new tracings.   Radiology    Dg Toe 2nd Right  Result Date:  12/17/2015 CLINICAL DATA:  Right second toe dislocation status post reduction EXAM: RIGHT SECOND TOE COMPARISON:  Film from earlier in the same day FINDINGS: The reduction of the PIP joint has been performed. Anatomic alignment is now well exchanged. A tiny bony density is again seen adjacent to the distal aspect of the proximal phalanx which may be related to a small avulsion. No other focal abnormality is noted. IMPRESSION: Status post reduction with possible small avulsion. Electronically Signed   By: Inez Catalina M.D.   On: 12/17/2015 17:13   Dg Toe 2nd Right  Result Date: 12/17/2015 CLINICAL DATA:  Right second toe pain after a fall today. Syncopal episode. EXAM: RIGHT SECOND TOE COMPARISON:  None. FINDINGS: Dorsal dislocation of the middle phalanx of the right second toe with respect to the proximal phalanx with tiny bone fragment inferiorly suggesting an avulsion fracture. Soft tissue swelling. IMPRESSION: Dislocation of the proximal interphalangeal joint of the right second toe with tiny avulsion fracture. Electronically Signed   By: Lucienne Capers M.D.   On: 12/17/2015 01:29    Cardiac Studies   Echocardiogram: 12/04/2015 Study Conclusions  - Left ventricle: The cavity size was normal. Systolic function was   normal. The estimated ejection fraction was in the range of 55%   to 60%. Wall motion was normal; there were no regional wall   motion abnormalities. There was an increased relative   contribution of atrial contraction to ventricular filling.   Doppler parameters are consistent with abnormal left ventricular   relaxation (grade 1 diastolic dysfunction). - Aortic valve: Trileaflet; mildly thickened, mildly calcified   leaflets. There was moderate regurgitation.  Patient Profile     26F w/ PMH of CAD (s/p CABGx3 and LAA clipping in 12/2014, NSTEMI 05/2015 s/p DES to native RCA and SVG-dRCA; occluded ramus-SVG was treated medically), paroxysmal atrial fib, tachybrady syndrome  (s/p PPM 2013), CKD stage II, orthostatic hypotension, PAD (s/p prior LE stenting), alcohol abuse, HTN, HLD, hyperkalemia, hypercalcemia, chronic diastolic CHF, moderate AI, syncope, & multiple medication intolerances who presented to [facility] on 12/16/2015 with a GIB.  Assessment & Plan    1. GIB/ABL anemia  - Xarelto and Plavix on hold per primary team. Transfused 2 units pRBC's on 11/8. EGD performed on 11/8 and unremarkable. Plans are for a colonoscopy on 11/11. - GI following  2. Syncope  - suspect related to blood pressure drop in the setting of volume loss  and recent orthostasis. - telemetry showing A-paced rhythm with no significant abnormalities.  3. Paroxysmal atrial fib/flutter  - This patients CHA2DS2-VASc Score and unadjusted Ischemic Stroke Rate (% per year) is equal to 9.7 % stroke rate/year from a score of 6 (HTN, Vascular, Female, Age (2)), DM). Xarelto held in preparation for colonoscopy. Receiving Heparin bridge.  - Continue Amiodarone 137m daily and Coreg 3.1250mBID.  4. CAD  - s/p CABG in 12/2014, PCI 05/2015 - she is now > 6 months out from recent stenting. The original plan was to continue until April 2018 but with significant GIB, will hold Plavix for now.   5. Stage 2 CKD - although Cr appears normal, CrCl is 46 (which is why she was on renally dosed Xarelto).   6. Hypotension - BP has improved to 91/30 - 148/66 in the past 24 hours following dosage reduction of Lisinopril and Coreg.  - Lisinopril reduced from 3062maily to 57m43mily. Continue with reduced dosing for now.   Signed, BritErma Heritage  12/19/2015, 12:57 PM  The patient has been seen in conjunction with BritBernerd Pho All aspects of care have been considered and discussed. The patient has been personally interviewed, examined, and all clinical data has been reviewed.   Patient is stable. Blood pressure is better today. Continue current course. Agree with the above note and  recommendations.

## 2015-12-19 NOTE — Consult Note (Addendum)
   [facility] CM Inpatient Consult   12/19/2015  [name] 01-27-1941 [id number]    Spoke with [name] at bedside to offer [place] [facility] program services. She has had x5 hospital admissions in past 6 months. Writer remembers patient from one of her last hospitalizations. She declined at that time. Spoke with her today to see if she would be interested in [place] [facility] follow up. She pleasantly declines. [name] states "I am familiar with your program, you guys aggravated me to death in the past calling and wanting to visit". States she has good follow up with her doctors and that " I am very independent, I do not need anyone calling me or wanting to come see me". Writer thanked [name] for her honesty as she did not mention this last time. Contact information left at bedside to contact should she change her mind. Made inpatient RNCM aware that [name] declined [place] [facility] services.   Marthenia Rolling, MSN-Ed, RN,BSN [facility] Liaison [phone]

## 2015-12-20 DIAGNOSIS — Z951 Presence of aortocoronary bypass graft: Secondary | ICD-10-CM

## 2015-12-20 DIAGNOSIS — Z7901 Long term (current) use of anticoagulants: Secondary | ICD-10-CM

## 2015-12-20 LAB — COMPREHENSIVE METABOLIC PANEL
ALT: 18 U/L (ref 14–54)
AST: 25 U/L (ref 15–41)
Albumin: 3.5 g/dL (ref 3.5–5.0)
Alkaline Phosphatase: 62 U/L (ref 38–126)
Anion gap: 3 — ABNORMAL LOW (ref 5–15)
BUN: 18 mg/dL (ref 6–20)
CO2: 27 mmol/L (ref 22–32)
Calcium: 9.8 mg/dL (ref 8.9–10.3)
Chloride: 107 mmol/L (ref 101–111)
Creatinine, Ser: 0.77 mg/dL (ref 0.44–1.00)
GFR calc Af Amer: >60 mL/min (ref >60)
GFR calc non Af Amer: >60 mL/min (ref >60)
Glucose, Bld: 118 mg/dL — ABNORMAL HIGH (ref 65–99)
Potassium: 4.7 mmol/L (ref 3.5–5.1)
Sodium: 137 mmol/L (ref 135–145)
Total Bilirubin: 0.4 mg/dL (ref 0.3–1.2)
Total Protein: 5.9 g/dL — ABNORMAL LOW (ref 6.5–8.1)

## 2015-12-20 LAB — CBC
HCT: 28.8 % — ABNORMAL LOW (ref 36.0–46.0)
Hemoglobin: 9.6 g/dL — ABNORMAL LOW (ref 12.0–15.0)
MCH: 28 pg (ref 26.0–34.0)
MCHC: 33.3 g/dL (ref 30.0–36.0)
MCV: 84 fL (ref 78.0–100.0)
Platelets: 204 10*3/uL (ref 150–400)
RBC: 3.43 MIL/uL — ABNORMAL LOW (ref 3.87–5.11)
RDW: 17.4 % — ABNORMAL HIGH (ref 11.5–15.5)
WBC: 5.4 10*3/uL (ref 4.0–10.5)

## 2015-12-20 LAB — HEPARIN LEVEL (UNFRACTIONATED): Heparin Unfractionated: 0.5 IU/mL (ref 0.30–0.70)

## 2015-12-20 LAB — GLUCOSE, CAPILLARY
Glucose-Capillary: 109 mg/dL — ABNORMAL HIGH (ref 65–99)
Glucose-Capillary: 115 mg/dL — ABNORMAL HIGH (ref 65–99)
Glucose-Capillary: 77 mg/dL (ref 65–99)

## 2015-12-20 LAB — PHOSPHORUS: Phosphorus: 2.6 mg/dL (ref 2.5–4.6)

## 2015-12-20 LAB — MAGNESIUM: Magnesium: 1.8 mg/dL (ref 1.7–2.4)

## 2015-12-20 MED ORDER — METOPROLOL TARTRATE 5 MG/5ML IV SOLN
2.5000 mg | Freq: Once | INTRAVENOUS | Status: AC
Start: 2015-12-20 — End: 2015-12-20
  Administered 2015-12-20: 2.5 mg via INTRAVENOUS
  Filled 2015-12-20: qty 5

## 2015-12-20 MED ORDER — SODIUM CHLORIDE 0.9 % IV SOLN: INTRAVENOUS | Status: DC

## 2015-12-20 MED ORDER — DIPHENHYDRAMINE HCL 25 MG PO CAPS
25.0000 mg | ORAL_CAPSULE | Freq: Once | ORAL | Status: AC
  Administered 2015-12-20: 25 mg via ORAL
  Filled 2015-12-20: qty 1

## 2015-12-20 MED ORDER — DIPHENHYDRAMINE HCL 25 MG PO CAPS
25.0000 mg | ORAL_CAPSULE | Freq: Once | ORAL | Status: AC
  Administered 2015-12-21: 25 mg via ORAL
  Filled 2015-12-20: qty 1

## 2015-12-20 NOTE — Progress Notes (Signed)
[place] for IV heparin (while off xarelto) Indication: atrial fibrillation  Allergies  Allergen Reactions  . Brilinta [Ticagrelor] Shortness Of Breath  . Clonidine Derivatives Other (See Comments)    Lowers heart rate  . Atorvastatin Other (See Comments)    Possible cause of fatigue/malaise  . Crestor [Rosuvastatin Calcium] Other (See Comments)    Joint/muscle aches  . Spironolactone     Contraindicated with history of hyperkalemia  . Exforge [Amlodipine Besylate-Valsartan] Itching and Rash    Patient Measurements: Height: _0  (162.6 cm) Weight: 135 lb 12.8 oz (61.6 kg) IBW/kg (Calculated) : 54.7 Heparin Dosing Weight: 62 kg (TBW)  Vital Signs: Temp: 97.9 F (36.6 C) (11/10 0632) Temp Source: Oral (11/10 0632) BP: 143/70 (11/10 0632) Pulse Rate: 78 (11/10 0632)  Labs:  Recent Labs  12/18/15 0456  12/18/15 1710 12/19/15 0451 12/19/15 1321 12/19/15 1929 12/20/15 0401  HGB 7.7*  --   --  9.9*  --  9.5* 9.6*  HCT 23.2*  --   --  29.2*  --  28.2* 28.8*  PLT 227  --   --  183  --   --  204  APTT  --   --  20*  --   --   --   --   LABPROT  --   --  12.9  --   --   --   --   INR  --   --  0.97  --   --   --   --   HEPARINUNFRC  --   < > <0.10* 0.42 0.50  --  0.50  CREATININE 0.86  --   --  0.97  --   --  0.77  < > = values in this interval not displayed.  Estimated Creatinine Clearance: 52.5 mL/min (by C-G formula based on SCr of 0.77 mg/dL).   Medical History: Past Medical History:  Diagnosis Date  . ANXIETY 12/31/2009  . Aortic insufficiency    a. mod by echo 2017.  [place] CAD (coronary artery disease)    a. s/p CABGx3 and LAA clipping in 12/2014, NSTEMI 05/2015 s/p DES to native RCA and SVG-dRCA; occluded ramus-SVG was treated medically). c. neg nuc 10/2015 at [facility].  . Chronic diastolic CHF (congestive heart failure) ([place])   . Chronic fatigue   . CKD (chronic kidney disease), stage II   . DEPRESSION 12/31/2009  . [place]  DISEASE, CERVICAL 12/31/2009  . [place] DISEASE, LUMBAR 12/31/2009  . DIVERTICULITIS, HX OF 12/31/2009  . Essential hypertension   . GLUCOSE INTOLERANCE 12/31/2009  . Habitual alcohol use   . Hypercalcemia   . Hyperkalemia   . Hyperlipidemia 12/31/2009  . MENOPAUSE, EARLY 12/31/2009  . NSTEMI (non-ST elevated myocardial infarction) ([place]) 05/11/2015  . Orthostatic hypotension   . OSTEOARTHRITIS, HAND   . Pacemaker 01/21/2012   MDT Adapta dual chamber  . Paroxysmal atrial flutter ([place])   . Persistent atrial fibrillation ([place])   . Pre-diabetes   . PVD (peripheral vascular disease), hx stents to bil SFAs 02/2010   . Symptomatic sinus bradycardia 01/22/2012  . Syncope    a. 11/2015 ? due to medications.  . Tachy-brady syndrome ([place])    a. s/p MDT PPM 2013.  . Tricuspid regurgitation     Medications:  Prescriptions Prior to Admission  Medication Sig Dispense Refill Last Dose  . amiodarone (PACERONE) 100 MG tablet Take 1 tablet (100 mg total) by mouth daily. (Patient taking differently: Take 100 mg  by mouth every morning. ) 30 tablet 11 12/16/2015 at Unknown time  . carvedilol (COREG) 6.25 MG tablet Take 1 tablet (6.25 mg total) by mouth 2 (two) times daily with a meal. 60 tablet 0 12/16/2015 at 0900  . cholecalciferol (VITAMIN D) 1000 UNITS tablet Take 1,000 Units by mouth at bedtime.    12/15/2015 at Unknown time  . citalopram (CELEXA) 20 MG tablet Take 0.5 tablets (10 mg total) by mouth daily. (Patient taking differently: Take 10 mg by mouth every morning. )   12/16/2015 at Unknown time  . clopidogrel (PLAVIX) 75 MG tablet TAKE 1 TABLET (75 MG TOTAL) BY MOUTH DAILY. (Patient taking differently: Take 75 mg by mouth every morning. ) 90 tablet 1 12/16/2015 at 0900  . Coenzyme Q10 (COQ10 PO) Take 120 mg by mouth at bedtime.    12/15/2015 at Unknown time  . hydrochlorothiazide (HYDRODIURIL) 25 MG tablet Take 25 mg by mouth once.   12/16/2015 at 0900  . lisinopril (PRINIVIL,ZESTRIL) 30 MG tablet Take 1  tablet (30 mg total) by mouth daily. (Patient taking differently: Take 30 mg by mouth every morning. ) 30 tablet 0 12/16/2015 at Unknown time  . Multiple Vitamin (MULTIVITAMIN) capsule Take 1 capsule by mouth 2 (two) times daily.    12/16/2015 at Unknown time  . nitroGLYCERIN (NITROSTAT) 0.4 MG SL tablet Place 1 tablet (0.4 mg total) under the tongue every 5 (five) minutes x 3 doses as needed for chest pain. 25 tablet 2 Past Week at Unknown time  . rivaroxaban (XARELTO) 15 MG TABS tablet Take 1 tablet (15 mg total) by mouth daily with supper. 30 tablet 5 12/16/2015 at 1800   Scheduled:  . amiodarone  100 mg Oral Daily  . bupivacaine (PF)  10-30 mL Infiltration Once  . carvedilol  3.125 mg Oral BID WC  . citalopram  10 mg Oral Daily  . Influenza vac split quadrivalent PF  0.5 mL Intramuscular Tomorrow-1000  . lidocaine  10-50 mL Intradermal Once  . lisinopril  10 mg Oral Daily  . pantoprazole  40 mg Oral Daily  . peg 3350 powder  0.5 kit Oral Once   And  . peg 3350 powder  0.5 kit Oral Once    Assessment: 75 y.o. female with past medical history of CAD status post CABG and stent placement in March 2017 maintained on Plavix and Xarelto, hypertension, paroxysmal A. fib, PVD, aortic insufficiency and tricuspid regurgitation and presented to the ER due to LOC. Hemoccult positive and patient reports melena, but EGD without findings. Plavix and Xarelto held in anticipation of colonoscopy on 11/11. Cardiology would like to initiate heparin given high CHADS2VASc score = 6   Baseline INR, aPTT, HL: pending (INR 1.41 on 10/26 - consistent with Xarelto use)  Prior anticoagulation: Xarelto (LD 11/6 at 1800), Plavix (LD 11/6 at 0900)  Significant events: 11/8: ordered 2 units PRBC; EGD on 11/8 with no signif findings 11/9: reported black stool this morning  Today, 12/20/2015:  Heparin level remains therapeutic at 0.50  CBC is relatively stable, no new bleeding documented  Heparin was  inadvertently turned off for 10 min this moring  Colonoscopy is scheduled for 12/21/15 with GI team recommends to d/c heparin drip 6 hours prior to procedure on Saturday.  Endoscopy reported that patient is scheduled for procedure at 8:30a on 12/21/15  Goal of Therapy: Heparin level 0.3-0.7 units/ml Monitor platelets by anticoagulation protocol: Yes  Plan:  Continue Heparin drip at 850 units/hr IV infusion.  Will  d/c heparin drip at 2 AM on 12/21/15 (~6 hrs prior to procedure)  GI Team, please advise pharmacy if/when you would like [place] to resume anticoagulation after colonoscopy procedure   Monitor for signs of bleeding   Dia Sitter, PharmD, BCPS 12/20/2015 8:14 AM

## 2015-12-20 NOTE — Progress Notes (Signed)
    Progress Note   Subjective  Chief Complaint: Melena, symptomatic anemia     Patient found asleep in bed this morning, she tells me that she took a sleeping pill last night for the first time in her life and still feels drowsy. She continues to be aware that plans are for a colonoscopy tomorrow, she will be on clear liquids today, nothing by mouth after midnight and start her prep this afternoon. Nurse was present during time of my exam and had already stopped her IV heparin, I recommend that they restart this, this just needs to be discontinued 6 hours prior to the procedure due to her large cardiac history.    Objective   Vital signs in last 24 hours: Temp:  [97.7 F (36.5 C)-98 F (36.7 C)] 97.9 F (36.6 C) (11/10 [id number]) Pulse Rate:  [75-80] 78 (11/10 0632) Resp:  [16-18] 18 (11/10 [id number]) BP: (113-143)/(54-85) 143/70 (11/10 [id number]) SpO2:  [97 %-100 %] 98 % (11/10 [id number]) Last BM Date: 12/18/15 General: Elderly Caucasian female in NAD Heart:  Regular rate and rhythm; no murmurs Lungs: Respirations even and unlabored, lungs CTA bilaterally Abdomen:  Soft, nontender and nondistended. Normal bowel sounds. Extremities:  Without edema. Neurologic:  Alert and oriented,  grossly normal neurologically. Psych:  Cooperative. Normal mood and affect.  Lab Results:  Recent Labs  12/18/15 0456 12/19/15 0451 12/19/15 1929 12/20/15 0401  WBC 5.9 6.4  --  5.4  HGB 7.7* 9.9* 9.5* 9.6*  HCT 23.2* 29.2* 28.2* 28.8*  PLT 227 183  --  204   BMET  Recent Labs  12/18/15 0456 12/19/15 0451 12/20/15 0401  NA 137 135 137  K 4.6 4.1 4.7  CL 105 109 107  CO2 27 24 27   GLUCOSE 110* 118* 118*  BUN 35* 30* 18  CREATININE 0.86 0.97 0.77  CALCIUM 10.3 9.7 9.8   LFT  Recent Labs  12/20/15 0401  PROT 5.9*  ALBUMIN 3.5  AST 25  ALT 18  ALKPHOS 62  BILITOT 0.4   PT/INR  Recent Labs  12/18/15 1710  LABPROT 12.9  INR 0.97       Assessment / Plan:   Assessment: 1. GI  bleed:Patient reports "black sticky" stools since the end of October on a daily basis, episode of syncope 11/15/15, she blames on her blood pressure medications, no history of upper GI symptoms; EGD 12/18/15 unremarkable, plans for colonoscopy Saturday to allow washout of Plavix 2. Right foot second toe dislocation:Being followed by orthopedics 3. CAD status post CABG and recent stent placed in March:Maintained on Plavix, held currently 4. A. fib and pacemaker placement:Xareltowill be held tomorrow morning 5. Acute blood loss anemia:The patient's hemoglobin was 12.3 10 days ago, now 9.9 after 1 uprbcs 12/18/15  Plan: 1. Continue monitoring hgb with transfusion as necessary 2. Planning for Colonoscopy Saturday  3. Patient will remain on clear liquid diet tomorrow and NPO after midnight 4. Will start prep this afternoon- will need to hold Heparin 6 hours prior to procedure on Saturday 5. Continue to hold Xarelto and Plavix 6. Will discuss above with Dr. Ardis Hughs, please await any further recs  Thank you for your kind consultation, we will continue to follow.   LOS: 3 days   Levin Erp  12/20/2015, 10:02 AM  Pager # [phone]   ________________________________________________________________________  Velora Heckler GI MD note:  I agree with the assessment and plan described above.   Owens Loffler, MD [facility] Gastroenterology Pager [phone]

## 2015-12-20 NOTE — Progress Notes (Signed)
PROGRESS NOTE    [name]  ZRA:[id number] DOB: 06-10-1940 DOA: 12/16/2015 PCP: [name], MD  Brief Narrative:  [name] is a 75 y.o. female with history of CAD status post CABG and stents placement recently in March 2017, Hypertension, paroxysmal atrial fibrillation and other comorbidites who presented to the ER because of loss of consciousness. Patient was recently admitted twice for uncontrolled blood pressure and syncopal episode was brought to the ER after patient had syncopal episode last night around 9 PM while walking to the bathroom. Patient states her granddaughter helped her to the floor but she did loose consciousness briefly. Did not have any incontinence of bowel or urine. Denied any chest pain or shortness of breath. Since last discharge patient has been very careful in getting out of the bed due to dizziness. Patient accidentally took hydrochlorothiazide yesterday. In the ER patient was found to have a hemoglobin of 9 which is 3 g drop from previous and patient states she also noticed black stools last 2 days. Stool for occult blood is positive. Patient states while patient was having the syncopal episode she may have caught her leg into the cabinet and patient has pain in the right foot which x-rays show has dislocated right second toe. ER physician was unable to fix it. Orthopedics was consulted for dislocation of toe. She underwent an EGD by Gastroenterology which was unremarkable. Cardiology also consulted for management of Atrial Fibrillation. Overnight patient's Hb/Hct remained Stable. Plan is for Colonoscopy in early Am.   Assessment & Plan:   Principal Problem:   Syncope Active Problems:   Essential hypertension   PAF (paroxysmal atrial fibrillation) (HCC)   S/P placement of cardiac pacemaker, medtronic adapta 01/21/12   PVD (peripheral vascular disease), hx stents to bil SFAs 02/2010   CAD (coronary artery disease)   S/P CABG x 3 01/04/15   Febrile illness,  acute   Paroxysmal atrial flutter (HCC)   Chronic diastolic heart failure (HCC)   Hypercalcemia   Gastrointestinal hemorrhage   Acute blood loss anemia   CKD (chronic kidney disease), stage II  Acute GI Bleed s/p transfusion of 2 units of pRBC -Denies taking any NSAIDs. Continue Holding Xarelto and Plavix for now.  -Follow CBC. -Gastroenterology Dr. Dutch Quint and EGD done  -EGD done showed normal esophagus, normal stomach, and normal examined duodenem; Patient has a Hx of Diverticulitis.  -Patient's Hb/Hct went from 9.9/29.2 -> 9.6/28.8 -C/w Protonix gtt discontined and patient now on Pantoprazole 40 mg Daily -Will need Colonoscopy and will target December 21, 2015 as patient's last Plavix dose was 11/6 -PER GI ok to resume Xarelto and Hold Friday November 10th in anticipation for Colonoscopy November 11th. Discussed with Cardiology Dr. Tamala Julian and will start Patient on Heparin gtt for better control of her Anticoagulation. Will D/C at [place] in anticipation of Colonoscopy.   -Clear Liquid Diet Today and NPO after midnight Tomorrow -GI will start Bowel Prep this afternoon with Moviprep 100 grams  Hypotension in the setting of likely Acute Upper GI Bleed -improved. BP this AM was 143/70 -Has a Hx of Hypertension -Cardiology decreased Lisinopril from 30 mg daily to 10 mg daily and also decreased Carvedilol to 3.125 mg BID.  -Is s/p Bolus NS 500 mL over 2 Hours and Transfusion of 2 units of pRBC -Continue to Monitor VS  Syncope likely 2/2 to Volume Loss and Orthostasis -Likely from Volume Depletion and Blood Loss -Cards and GI following -S/p transfusion of 2 units of pRBC -  C/w Telemetry; If reoccurs will obtain Echocardiogram and Orthostatic Vital Signs  Right Foot Second Toe Dislocation -Orthopedics Dr. Herbert Seta and performed Closed Reduction and Buddy Tape with Cast Shoe as needed -Will Follow up As an Outpatient and have Repeat Imaging at Office  CAD status post  CABG and recent stents  -s/p CABG 1 year ago -Cardiology following -Held Plavix. Discussed with Cardiology about Xarelto and will start Heparin gtt instead of Xarelto for better control. Talked with patient and she agrees to start Heparin gtt. *WILL D/C Heparin gtt 6 hours prior to Colonoscopy* -Had Chest pain few days ago and if returns Cardiology Recommends SL NTG as patient had Anginal Equivalent. -C/w Carvedilol 3.125 mg po BID and with Lisinopril 10 mg daily.   Paroxysmal Atrial fibrillation/ Atrial Flutter -Renally dosed Xarelto on hold due to GI bleed. PER GI ok to restart. Discussed with Cardiology Dr. Tamala Julian and will Anticoagulate patient with Heparin gtt instead of Xarelto for better control. Continue to Monitor  -CHADS2VASC elevated at 6 -C/w Amiodarone 100 mg daily and Coreg 3.125 mg po BID -C/w Telemetry  Acute Blood Loss Anemia s/p Transfusion of 2 Units of pRBC -S/p Transfusion of 2 units of pRBC and follow CBC. -H/H last night was stable at 9.5/28.2 -CBC showed Hb/Hct of 9.6/28.8.  Hypercalcemia, improved -Improved to 9.8 (9.7 yesterday) -Suspect likely due to dehydration and HCTZ.  -Continue to Follow Metabolic Panel  Anxiety/Depression -Continue with Citalopram 10 mg Daily  DVT prophylaxis: SCDs; Heparin gtt Code Status: Do Not Intubate Family Communication: No family at Bedside. Disposition Plan: Pending PT Evaluation after colonoscopy  Consultants:  Cardiology Gastroenterology Orthopedic Surgery  Procedures: EGD today; Colonoscopy 12/21/15  Antimicrobials: None  Subjective: Seen and examined at bedside and she felt a little drowsy after the sleeping pill she took but denied any active complaints. Felt better than she had and denied Cp, SOB, N/V or Abdominal Pain. Stated she had Toe Pain in her Right foot from where she dislocated it.   Objective: Vitals:   12/19/15 0959 12/19/15 1437 12/19/15 2146 12/20/15 0632  BP: (!) 96/49 (!) 113/54 132/85 (!)  143/70  Pulse: 76 75 80 78  Resp: '16 16 18 18  ' Temp: 97.6 F (36.4 C) 98 F (36.7 C) 97.7 F (36.5 C) 97.9 F (36.6 C)  TempSrc: Oral Oral Oral Oral  SpO2: 100% 100% 97% 98%  Weight:      Height:        Intake/Output Summary (Last 24 hours) at 12/20/15 0827 Last data filed at 12/20/15 0600  Gross per 24 hour  Intake           1155.5 ml  Output             3550 ml  Net          -2394.5 ml   Filed Weights   12/17/15 0248  Weight: 61.6 kg (135 lb 12.8 oz)   Examination: Physical Exam:  Constitutional: WN/WD, NAD Eyes: Lids normal and minimal conjunctival pallor. Sclerae anicteric  ENMT: External Ears, Nose appear normal. Grossly normal hearing. Mucous membranes appeared moist.  Neck: Appears normal, supple, no cervical masses, normal ROM, no appreciable thyromegaly, no JVD Respiratory: Clear to auscultation bilaterally, no wheezing, rales, rhonchi or crackles. Normal respiratory effort and patient is not tachypenic. No accessory muscle use.  Cardiovascular: RRR, no murmurs / rubs / gallops. S1 and S2 auscultated. No extremity edema. 2+ pedal pulses.   Abdomen: Soft, non-tender, non-distended. No masses palpated.  No appreciable hepatosplenomegaly. Bowel sounds positive x4.  GU: Deferred. Musculoskeletal: Mild Weakness in Upper and Lower Extremities. Right 2nd toe buddy taped to 3rd.  Skin: No rashes, lesions, ulcers. No induration; Warm and dry.  Neurologic: CN 2-12 grossly intact with no focal deficits. Sensation intact in all 4 Extremities. Romberg sign cerebellar reflexes not assessed.  Psychiatric: Normal judgment and insight. Alert and oriented x 3. Normal mood and pleasant affect.   Data Reviewed: I have personally reviewed following labs and imaging studies  CBC:  Recent Labs Lab 12/17/15 1050 12/17/15 1404 12/18/15 0456 12/19/15 0451 12/19/15 1929 12/20/15 0401  WBC 7.6 7.6 5.9 6.4  --  5.4  NEUTROABS  --   --  2.7  --   --   --   HGB 8.6* 8.4* 7.7* 9.9*  9.5* 9.6*  HCT 25.7* 25.0* 23.2* 29.2* 28.2* 28.8*  MCV 82.6 82.5 85.3 85.1  --  84.0  PLT 230 232 227 183  --  376   Basic Metabolic Panel:  Recent Labs Lab 12/16/15 2343 12/17/15 0647 12/18/15 0456 12/19/15 0451 12/20/15 0401  NA 135 137 137 135 137  K 3.7 3.8 4.6 4.1 4.7  CL 104 104 105 109 107  CO2 '27 27 27 24 27  ' GLUCOSE 147* 127* 110* 118* 118*  BUN 51* 45* 35* 30* 18  CREATININE 1.00 0.91 0.86 0.97 0.77  CALCIUM 11.0* 10.4* 10.3 9.7 9.8  MG  --   --   --  2.0 1.8  PHOS  --   --   --  2.9 2.6   GFR: Estimated Creatinine Clearance: 52.5 mL/min (by C-G formula based on SCr of 0.77 mg/dL). Liver Function Tests:  Recent Labs Lab 12/17/15 0647 12/19/15 0451 12/20/15 0401  AST '19 24 25  ' ALT '15 16 18  ' ALKPHOS 70 62 62  BILITOT 0.6 1.3* 0.4  PROT 5.8* 5.9* 5.9*  ALBUMIN 3.4* 3.5 3.5   No results for input(s): LIPASE, AMYLASE in the last 168 hours. No results for input(s): AMMONIA in the last 168 hours. Coagulation Profile:  Recent Labs Lab 12/18/15 1710  INR 0.97   Cardiac Enzymes:  Recent Labs Lab 12/17/15 0647  TROPONINI <0.03   BNP (last 3 results) No results for input(s): PROBNP in the last 8760 hours. HbA1C:  Recent Labs  12/18/15 0456  HGBA1C 5.5   CBG:  Recent Labs Lab 12/18/15 1604 12/18/15 2314 12/19/15 0805 12/19/15 1608 12/20/15 0034  GLUCAP 90 104* 106* 109* 115*   Lipid Profile: No results for input(s): CHOL, HDL, LDLCALC, TRIG, CHOLHDL, LDLDIRECT in the last 72 hours. Thyroid Function Tests: No results for input(s): TSH, T4TOTAL, FREET4, T3FREE, THYROIDAB in the last 72 hours. Anemia Panel: No results for input(s): VITAMINB12, FOLATE, FERRITIN, TIBC, IRON, RETICCTPCT in the last 72 hours. Sepsis Labs: No results for input(s): PROCALCITON, LATICACIDVEN in the last 168 hours.  No results found for this or any previous visit (from the past 240 hour(s)).   Radiology Studies: No results found. Scheduled Meds: .  amiodarone  100 mg Oral Daily  . bupivacaine (PF)  10-30 mL Infiltration Once  . carvedilol  3.125 mg Oral BID WC  . citalopram  10 mg Oral Daily  . Influenza vac split quadrivalent PF  0.5 mL Intramuscular Tomorrow-1000  . lidocaine  10-50 mL Intradermal Once  . lisinopril  10 mg Oral Daily  . pantoprazole  40 mg Oral Daily  . peg 3350 powder  0.5 kit Oral Once  And  . peg 3350 powder  0.5 kit Oral Once   Continuous Infusions: . heparin 850 Units/hr (12/20/15 0033)    LOS: 3 days   Kerney Elbe, DO [facility] Pager [phone]  If 7PM-7AM, please contact night-coverage [email or web address] Password [email or web address] 12/20/2015, 8:27 AM

## 2015-12-20 NOTE — Progress Notes (Signed)
Patient Name: [name] Date of Encounter: 12/20/2015  Primary Cardiologist: Dr. Delila Pereyra Problem List     Principal Problem:   Syncope Active Problems:   Essential hypertension   PAF (paroxysmal atrial fibrillation) (HCC)   S/P placement of cardiac pacemaker, medtronic adapta 01/21/12   PVD (peripheral vascular disease), hx stents to bil SFAs 02/2010   CAD (coronary artery disease)   S/P CABG x 3 01/04/15   Febrile illness, acute   Paroxysmal atrial flutter (HCC)   Chronic diastolic heart failure (HCC)   Hypercalcemia   Gastrointestinal hemorrhage   Acute blood loss anemia   CKD (chronic kidney disease), stage II    Subjective   Denies any chest pain or palpitations. On clear liquid diet in preparation for colonoscopy tomorrow.   Inpatient Medications    Scheduled Meds: . amiodarone  100 mg Oral Daily  . bupivacaine (PF)  10-30 mL Infiltration Once  . carvedilol  3.125 mg Oral BID WC  . citalopram  10 mg Oral Daily  . Influenza vac split quadrivalent PF  0.5 mL Intramuscular Tomorrow-1000  . lidocaine  10-50 mL Intradermal Once  . lisinopril  10 mg Oral Daily  . pantoprazole  40 mg Oral Daily  . peg 3350 powder  0.5 kit Oral Once   And  . peg 3350 powder  0.5 kit Oral Once   Continuous Infusions: . heparin 850 Units/hr (12/20/15 0033)   PRN Meds: acetaminophen **OR** acetaminophen, acetaminophen, nitroGLYCERIN, ondansetron **OR** ondansetron (ZOFRAN) IV   Vital Signs    Vitals:   12/19/15 0959 12/19/15 1437 12/19/15 2146 12/20/15 0632  BP: (!) 96/49 (!) 113/54 132/85 (!) 143/70  Pulse: 76 75 80 78  Resp: '16 16 18 18  ' Temp: 97.6 F (36.4 C) 98 F (36.7 C) 97.7 F (36.5 C) 97.9 F (36.6 C)  TempSrc: Oral Oral Oral Oral  SpO2: 100% 100% 97% 98%  Weight:      Height:        Intake/Output Summary (Last 24 hours) at 12/20/15 0936 Last data filed at 12/20/15 0700  Gross per 24 hour  Intake           1155.5 ml  Output             3550  ml  Net          -2394.5 ml   Filed Weights   12/17/15 0248  Weight: 135 lb 12.8 oz (61.6 kg)    Physical Exam    GEN: Well nourished, well developed Caucasian female appearing in no acute distress.  HEENT: Grossly normal.  Neck: Supple, no JVD, carotid bruits, or masses. Cardiac: RRR, no murmurs, rubs, or gallops. No clubbing, cyanosis, edema.  Radials/DP/PT 2+ and equal bilaterally.  Respiratory:  Respirations regular and unlabored, clear to auscultation bilaterally. GI: Soft, nontender, nondistended, BS + x 4. MS: no deformity or atrophy. Skin: warm and dry, no rash. Neuro:  Strength and sensation are intact. Psych: AAOx3.  Normal affect.  Labs    CBC  Recent Labs  12/18/15 0456 12/19/15 0451 12/19/15 1929 12/20/15 0401  WBC 5.9 6.4  --  5.4  NEUTROABS 2.7  --   --   --   HGB 7.7* 9.9* 9.5* 9.6*  HCT 23.2* 29.2* 28.2* 28.8*  MCV 85.3 85.1  --  84.0  PLT 227 183  --  462   Basic Metabolic Panel  Recent Labs  12/19/15 0451 12/20/15 0401  NA 135 137  K 4.1 4.7  CL 109 107  CO2 24 27  GLUCOSE 118* 118*  BUN 30* 18  CREATININE 0.97 0.77  CALCIUM 9.7 9.8  MG 2.0 1.8  PHOS 2.9 2.6   Liver Function Tests  Recent Labs  12/19/15 0451 12/20/15 0401  AST 24 25  ALT 16 18  ALKPHOS 62 62  BILITOT 1.3* 0.4  PROT 5.9* 5.9*  ALBUMIN 3.5 3.5   No results for input(s): LIPASE, AMYLASE in the last 72 hours. Cardiac Enzymes No results for input(s): CKTOTAL, CKMB, CKMBINDEX, TROPONINI in the last 72 hours. BNP Invalid input(s): POCBNP D-Dimer No results for input(s): DDIMER in the last 72 hours. Hemoglobin A1C  Recent Labs  12/18/15 0456  HGBA1C 5.5   Fasting Lipid Panel No results for input(s): CHOL, HDL, LDLCALC, TRIG, CHOLHDL, LDLDIRECT in the last 72 hours. Thyroid Function Tests No results for input(s): TSH, T4TOTAL, T3FREE, THYROIDAB in the last 72 hours.  Invalid input(s): FREET3  Telemetry    A-paced, HR in 70's. - Personally  Reviewed  ECG    No new tracings.   Radiology    Dg Toe 2nd Right  Result Date: 12/17/2015 CLINICAL DATA:  Right second toe dislocation status post reduction EXAM: RIGHT SECOND TOE COMPARISON:  Film from earlier in the same day FINDINGS: The reduction of the PIP joint has been performed. Anatomic alignment is now well exchanged. A tiny bony density is again seen adjacent to the distal aspect of the proximal phalanx which may be related to a small avulsion. No other focal abnormality is noted. IMPRESSION: Status post reduction with possible small avulsion. Electronically Signed   By: Inez Catalina M.D.   On: 12/17/2015 17:13   Dg Toe 2nd Right  Result Date: 12/17/2015 CLINICAL DATA:  Right second toe pain after a fall today. Syncopal episode. EXAM: RIGHT SECOND TOE COMPARISON:  None. FINDINGS: Dorsal dislocation of the middle phalanx of the right second toe with respect to the proximal phalanx with tiny bone fragment inferiorly suggesting an avulsion fracture. Soft tissue swelling. IMPRESSION: Dislocation of the proximal interphalangeal joint of the right second toe with tiny avulsion fracture. Electronically Signed   By: Lucienne Capers M.D.   On: 12/17/2015 01:29    Cardiac Studies   Echocardiogram: 12/04/2015 Study Conclusions  - Left ventricle: The cavity size was normal. Systolic function was   normal. The estimated ejection fraction was in the range of 55%   to 60%. Wall motion was normal; there were no regional wall   motion abnormalities. There was an increased relative   contribution of atrial contraction to ventricular filling.   Doppler parameters are consistent with abnormal left ventricular   relaxation (grade 1 diastolic dysfunction). - Aortic valve: Trileaflet; mildly thickened, mildly calcified   leaflets. There was moderate regurgitation.  Patient Profile     77F w/ PMH of CAD (s/p CABGx3 and LAA clipping in 12/2014, NSTEMI 05/2015 s/p DES to native RCA and SVG-dRCA;  occluded ramus-SVG was treated medically), paroxysmal atrial fib, tachybrady syndrome (s/p PPM 2013), CKD stage II, orthostatic hypotension, PAD (s/p prior LE stenting), alcohol abuse, HTN, HLD, hyperkalemia, hypercalcemia, chronic diastolic CHF, moderate AI, syncope, & multiple medication intolerances who presented to [facility] on 12/16/2015 with a GIB.  Assessment & Plan    1. GIB/ABL anemia  - Xarelto and Plavix on hold per primary team. Transfused 2 units pRBC's on 11/8. EGD performed on 11/8 and unremarkable. Plans are for a colonoscopy on 11/11. - GI following  2. Syncope  - suspect related to blood pressure drop in the setting of volume loss and recent orthostasis. No repeat episodes since being admitted. - telemetry showing A-paced rhythm with no atopic events.  3. Paroxysmal atrial fib/flutter  - This patients CHA2DS2-VASc Score and unadjusted Ischemic Stroke Rate (% per year) is equal to 9.7 % stroke rate/year from a score of 6 (HTN, Vascular, Female, Age (2)), DM). Xarelto held in preparation for colonoscopy. Receiving Heparin bridge.  - Continue Amiodarone 171m daily and Coreg 3.1241mBID.  4. CAD  - s/p CABG in 12/2014, PCI 05/2015 - she is now > 6 months out from recent stenting. The original plan was to continue until April 2018 but with significant GIB, will hold Plavix for now.   5. Stage 2 CKD - creatinine improved to 0.77. CrCl is 46 (which is why she was on renally dosed Xarelto).   6. Hypotension - BP has improved to 96/49 - 143/85 in the past 24 hours following dosage reduction of Lisinopril and Coreg.  - Lisinopril reduced from 3038maily to 37m56mily. Continue with reduced dosing for now.   Signed, BritErma Heritage  12/20/2015, 9:36 AM   The patient has been seen in conjunction with [facility]. All aspects of care have been considered and discussed. The patient has been personally interviewed, examined, and all clinical data has been  reviewed.   No change in plan.  Awaiting clearance from GI with reference to anticoagulation/antiplatelet therapy.

## 2015-12-21 ENCOUNTER — Encounter (HOSPITAL_COMMUNITY)
Admission: EM | Disposition: A | Discharge status: Home Health | Payer: Self-pay | Source: Home / Self Care | Attending: Internal Medicine

## 2015-12-21 ENCOUNTER — Encounter: Payer: Self-pay | Admitting: Internal Medicine

## 2015-12-21 ENCOUNTER — Encounter (HOSPITAL_COMMUNITY): Payer: Self-pay

## 2015-12-21 DIAGNOSIS — D122 Benign neoplasm of ascending colon: Secondary | ICD-10-CM

## 2015-12-21 DIAGNOSIS — M79674 Pain in right toe(s): Secondary | ICD-10-CM

## 2015-12-21 HISTORY — PX: COLONOSCOPY: SHX5424

## 2015-12-21 HISTORY — PX: GIVENS CAPSULE STUDY: SHX5432

## 2015-12-21 LAB — COMPREHENSIVE METABOLIC PANEL
ALT: 19 U/L (ref 14–54)
AST: 24 U/L (ref 15–41)
Albumin: 3.7 g/dL (ref 3.5–5.0)
Alkaline Phosphatase: 68 U/L (ref 38–126)
Anion gap: 5 (ref 5–15)
BUN: 10 mg/dL (ref 6–20)
CO2: 25 mmol/L (ref 22–32)
Calcium: 10.3 mg/dL (ref 8.9–10.3)
Chloride: 109 mmol/L (ref 101–111)
Creatinine, Ser: 0.72 mg/dL (ref 0.44–1.00)
GFR calc Af Amer: >60 mL/min (ref >60)
GFR calc non Af Amer: >60 mL/min (ref >60)
Glucose, Bld: 91 mg/dL (ref 65–99)
Potassium: 4.7 mmol/L (ref 3.5–5.1)
Sodium: 139 mmol/L (ref 135–145)
Total Bilirubin: 0.5 mg/dL (ref 0.3–1.2)
Total Protein: 5.8 g/dL — ABNORMAL LOW (ref 6.5–8.1)

## 2015-12-21 LAB — CBC WITH DIFFERENTIAL/PLATELET
Basophils Absolute: 0 10*3/uL (ref 0.0–0.1)
Basophils Relative: 1 %
Eosinophils Absolute: 0.3 10*3/uL (ref 0.0–0.7)
Eosinophils Relative: 5 %
HCT: 29.5 % — ABNORMAL LOW (ref 36.0–46.0)
Hemoglobin: 9.9 g/dL — ABNORMAL LOW (ref 12.0–15.0)
Lymphocytes Relative: 35 %
Lymphs Abs: 1.9 10*3/uL (ref 0.7–4.0)
MCH: 28.9 pg (ref 26.0–34.0)
MCHC: 33.6 g/dL (ref 30.0–36.0)
MCV: 86 fL (ref 78.0–100.0)
Monocytes Absolute: 0.6 10*3/uL (ref 0.1–1.0)
Monocytes Relative: 10 %
Neutro Abs: 2.7 10*3/uL (ref 1.7–7.7)
Neutrophils Relative %: 49 %
Platelets: 223 10*3/uL (ref 150–400)
RBC: 3.43 MIL/uL — ABNORMAL LOW (ref 3.87–5.11)
RDW: 17.4 % — ABNORMAL HIGH (ref 11.5–15.5)
WBC: 5.5 10*3/uL (ref 4.0–10.5)

## 2015-12-21 LAB — GLUCOSE, CAPILLARY
Glucose-Capillary: 98 mg/dL (ref 65–99)
Glucose-Capillary: 77 mg/dL (ref 65–99)
Glucose-Capillary: 81 mg/dL (ref 65–99)

## 2015-12-21 LAB — PHOSPHORUS: Phosphorus: 3 mg/dL (ref 2.5–4.6)

## 2015-12-21 LAB — HEPARIN LEVEL (UNFRACTIONATED): Heparin Unfractionated: 0.2 IU/mL — ABNORMAL LOW (ref 0.30–0.70)

## 2015-12-21 LAB — MAGNESIUM: Magnesium: 1.9 mg/dL (ref 1.7–2.4)

## 2015-12-21 SURGERY — IMAGING PROCEDURE, GI TRACT, INTRALUMINAL, VIA CAPSULE
Anesthesia: LOCAL

## 2015-12-21 SURGERY — COLONOSCOPY
Anesthesia: Monitor Anesthesia Care

## 2015-12-21 MED ORDER — MIDAZOLAM HCL 5 MG/ML IJ SOLN
INTRAMUSCULAR | Status: AC
  Filled 2015-12-21: qty 2

## 2015-12-21 MED ORDER — SODIUM CHLORIDE 0.9 % IV SOLN: INTRAVENOUS | Status: DC

## 2015-12-21 MED ORDER — FENTANYL CITRATE (PF) 100 MCG/2ML IJ SOLN
INTRAMUSCULAR | Status: AC
  Filled 2015-12-21: qty 2

## 2015-12-21 MED ORDER — DIPHENHYDRAMINE HCL 25 MG PO CAPS
25.0000 mg | ORAL_CAPSULE | Freq: Every evening | ORAL | Status: DC | PRN
  Administered 2015-12-21 – 2015-12-22 (×2): 25 mg via ORAL
  Filled 2015-12-21 (×2): qty 1

## 2015-12-21 MED ORDER — HEPARIN (PORCINE) IN NACL 100-0.45 UNIT/ML-% IJ SOLN
900.0000 [IU]/h | INTRAMUSCULAR | Status: DC
  Administered 2015-12-21: 850 [IU]/h via INTRAVENOUS
  Administered 2015-12-23: 900 [IU]/h via INTRAVENOUS
  Filled 2015-12-21 (×2): qty 250

## 2015-12-21 MED ORDER — LISINOPRIL 20 MG PO TABS
20.0000 mg | ORAL_TABLET | Freq: Every day | ORAL | Status: DC
  Administered 2015-12-22 – 2015-12-23 (×2): 20 mg via ORAL
  Filled 2015-12-21 (×3): qty 1

## 2015-12-21 MED ORDER — MIDAZOLAM HCL 5 MG/5ML IJ SOLN
INTRAMUSCULAR | Status: DC | PRN
  Administered 2015-12-21: 1 mg via INTRAVENOUS
  Administered 2015-12-21 (×2): 2 mg via INTRAVENOUS

## 2015-12-21 MED ORDER — DIPHENHYDRAMINE HCL 25 MG PO CAPS: 25.0000 mg | ORAL_CAPSULE | Freq: Every evening | ORAL | Status: DC | PRN

## 2015-12-21 MED ORDER — FENTANYL CITRATE (PF) 100 MCG/2ML IJ SOLN
INTRAMUSCULAR | Status: DC | PRN
  Administered 2015-12-21 (×2): 25 ug via INTRAVENOUS

## 2015-12-21 SURGICAL SUPPLY — 1 items: TOWEL COTTON PACK 4EA (MISCELLANEOUS) ×4 IMPLANT

## 2015-12-21 NOTE — H&P (View-Only) (Signed)
    Progress Note   Subjective  Chief Complaint: Melena, symptomatic anemia     Patient found asleep in bed this morning, she tells me that she took a sleeping pill last night for the first time in her life and still feels drowsy. She continues to be aware that plans are for a colonoscopy tomorrow, she will be on clear liquids today, nothing by mouth after midnight and start her prep this afternoon. Nurse was present during time of my exam and had already stopped her IV heparin, I recommend that they restart this, this just needs to be discontinued 6 hours prior to the procedure due to her large cardiac history.    Objective   Vital signs in last 24 hours: Temp:  [97.7 F (36.5 C)-98 F (36.7 C)] 97.9 F (36.6 C) (11/10 [id number]) Pulse Rate:  [75-80] 78 (11/10 0632) Resp:  [16-18] 18 (11/10 [id number]) BP: (113-143)/(54-85) 143/70 (11/10 [id number]) SpO2:  [97 %-100 %] 98 % (11/10 [id number]) Last BM Date: 12/18/15 General: Elderly Caucasian female in NAD Heart:  Regular rate and rhythm; no murmurs Lungs: Respirations even and unlabored, lungs CTA bilaterally Abdomen:  Soft, nontender and nondistended. Normal bowel sounds. Extremities:  Without edema. Neurologic:  Alert and oriented,  grossly normal neurologically. Psych:  Cooperative. Normal mood and affect.  Lab Results:  Recent Labs  12/18/15 0456 12/19/15 0451 12/19/15 1929 12/20/15 0401  WBC 5.9 6.4  --  5.4  HGB 7.7* 9.9* 9.5* 9.6*  HCT 23.2* 29.2* 28.2* 28.8*  PLT 227 183  --  204   BMET  Recent Labs  12/18/15 0456 12/19/15 0451 12/20/15 0401  NA 137 135 137  K 4.6 4.1 4.7  CL 105 109 107  CO2 27 24 27   GLUCOSE 110* 118* 118*  BUN 35* 30* 18  CREATININE 0.86 0.97 0.77  CALCIUM 10.3 9.7 9.8   LFT  Recent Labs  12/20/15 0401  PROT 5.9*  ALBUMIN 3.5  AST 25  ALT 18  ALKPHOS 62  BILITOT 0.4   PT/INR  Recent Labs  12/18/15 1710  LABPROT 12.9  INR 0.97       Assessment / Plan:   Assessment: 1. GI  bleed:Patient reports "black sticky" stools since the end of October on a daily basis, episode of syncope 11/15/15, she blames on her blood pressure medications, no history of upper GI symptoms; EGD 12/18/15 unremarkable, plans for colonoscopy Saturday to allow washout of Plavix 2. Right foot second toe dislocation:Being followed by orthopedics 3. CAD status post CABG and recent stent placed in March:Maintained on Plavix, held currently 4. A. fib and pacemaker placement:Xareltowill be held tomorrow morning 5. Acute blood loss anemia:The patient's hemoglobin was 12.3 10 days ago, now 9.9 after 1 uprbcs 12/18/15  Plan: 1. Continue monitoring hgb with transfusion as necessary 2. Planning for Colonoscopy Saturday  3. Patient will remain on clear liquid diet tomorrow and NPO after midnight 4. Will start prep this afternoon- will need to hold Heparin 6 hours prior to procedure on Saturday 5. Continue to hold Xarelto and Plavix 6. Will discuss above with Dr. Ardis Hughs, please await any further recs  Thank you for your kind consultation, we will continue to follow.   LOS: 3 days   Levin Erp  12/20/2015, 10:02 AM  Pager # [phone]   ________________________________________________________________________  Velora Heckler GI MD note:  I agree with the assessment and plan described above.   Owens Loffler, MD [facility] Pager [phone]

## 2015-12-21 NOTE — Interval H&P Note (Signed)
History and Physical Interval Note:  12/21/2015 9:21 AM  [name]  has presented today for surgery, with the diagnosis of IDA  The various methods of treatment have been discussed with the patient and family. After consideration of risks, benefits and other options for treatment, the patient has consented to  Procedure(s): COLONOSCOPY (N/A) as a surgical intervention .  The patient's history has been reviewed, patient examined, no change in status, stable for surgery.  I have reviewed the patient's chart and labs.  Questions were answered to the patient's satisfaction.     Milus Banister

## 2015-12-21 NOTE — Op Note (Signed)
[facility] Patient Name: [name] Procedure Date: 12/21/2015 MRN: [id number] Attending MD: Milus Banister , MD Date of Birth: [birth year] CSN: [id number] Age: 75 Admit Type: Inpatient Procedure:                Colonoscopy Indications:              Melena, acute on chronic anemia; on plavix and                            xarelto; EGD 3 days ago was normal. Providers:                Milus Banister, MD, Hilma Favors, RN, William Dalton, Technician Referring MD:              Medicines:                Fentanyl 50 micrograms IV, Midazolam 5 mg IV Complications:            No immediate complications. Estimated blood loss:                            None. Estimated Blood Loss:     Estimated blood loss: none. Procedure:                Pre-Anesthesia Assessment:                           - Prior to the procedure, a History and Physical                            was performed, and patient medications and                            allergies were reviewed. The patient's tolerance of                            previous anesthesia was also reviewed. The risks                            and benefits of the procedure and the sedation                            options and risks were discussed with the patient.                            All questions were answered, and informed consent                            was obtained. Prior Anticoagulants: The patient has                            taken Plavix (clopidogrel), last dose was 5 days  prior to procedure. Also takes xarelto, last dose                            was 3-4 days ago. ASA Grade Assessment: IV - A                            patient with severe systemic disease that is a                            constant threat to life. After reviewing the risks                            and benefits, the patient was deemed in                            satisfactory  condition to undergo the procedure.                           After obtaining informed consent, the colonoscope                            was passed under direct vision. Throughout the                            procedure, the patient's blood pressure, pulse, and                            oxygen saturations were monitored continuously. The                            [id number] scope was introduced through                            the anus and advanced to the the terminal ileum.                            The colonoscopy was performed without difficulty.                            The patient tolerated the procedure well. The                            quality of the bowel preparation was good. The                            terminal ileum, ileocecal valve, appendiceal                            orifice, and rectum were photographed. Scope In: 9:33:43 AM Scope Out: 9:48:20 AM Scope Withdrawal Time: 0 hours 9 minutes 53 seconds  Total Procedure Duration: 0 hours 14 minutes 37 seconds  Findings:      Terminal ileum was normal.      A few small-mouthed diverticula were found  in the entire colon.      A 9 mm polyp was found in the cecum. The polyp was sessile. The polyp       was removed with a cold snare. Resection and retrieval were complete.      There was a normal appearing end-to-side colo-colonic anastomosis in the       sigmoid colon.      Internal hemorrhoids were found. The hemorrhoids were small.      The exam was otherwise without abnormality on direct and retroflexion       views.      There was no old or fresh blood in the colon or terminal ileum. Impression:               - Diverticulosis in the entire examined colon.                           - One 9 mm polyp in the cecum, removed with a cold                            snare. Resected and retrieved.                           - Normal appearing end-to-side colo-colonic                            anastomosis (from  remote colon surgery for                            complicated diverticular disease).                           - Internal hemorrhoids.                           - The examination was otherwise normal on direct                            and retroflexion views, including normal terminal                            ileum                           - These findings do no help explain her acute on                            chronic anemia, melena (in setting of dual blood                            thinners). Moderate Sedation:      Moderate (conscious) sedation was administered by the endoscopy nurse       and supervised by the endoscopist. The following parameters were       monitored: oxygen saturation, heart rate, blood pressure, and response       to care. Total physician intraservice time was 15 minutes. Recommendation:           - Will  proceed with small bowel capsule study today.                           - Ok to resume her heparin at noon today.                           - Diet advance per SB capsule protocol. Procedure Code(s):        --- Professional ---                           [phone], Colonoscopy, flexible; with removal of                            tumor(s), polyp(s), or other lesion(s) by snare                            technique Diagnosis Code(s):        --- Professional ---                           K64.8, Other hemorrhoids                           D12.0, Benign neoplasm of cecum                           Z98.0, Intestinal bypass and anastomosis status                           K92.1, Melena (includes Hematochezia)                           K57.30, Diverticulosis of large intestine without                            perforation or abscess without bleeding CPT copyright 2016 [employer]. All rights reserved. The codes documented in this report are preliminary and upon coder review may  be revised to meet current compliance requirements. Milus Banister,  MD 12/21/2015 9:57:44 AM This report has been signed electronically. Number of Addenda: 0

## 2015-12-21 NOTE — Progress Notes (Signed)
Patient's blood pressure was elevated at bedtime, 194/92 and 204/93. NP on call was notified and a new order for Metoprolol IV was received and given. Patient also requested something for sleep so a one time dose of Benadryl was given per orders.  Will continue to monitor patient.

## 2015-12-21 NOTE — Progress Notes (Signed)
PROGRESS NOTE    [name]  [id number] DOB: Jan 03, 1941 DOA: 12/16/2015 PCP: [name], MD  Brief Narrative:  [name] is a 75 y.o. female with history of CAD status post CABG and stents placement recently in March 2017, Hypertension, paroxysmal atrial fibrillation and other comorbidites who presented to the ER because of loss of consciousness. Patient was recently admitted twice for uncontrolled blood pressure and syncopal episode was brought to the ER after patient had syncopal episode last night around 9 PM while walking to the bathroom. Patient states her granddaughter helped her to the floor but she did loose consciousness briefly. Did not have any incontinence of bowel or urine. Denied any chest pain or shortness of breath. Since last discharge patient has been very careful in getting out of the bed due to dizziness. Patient accidentally took hydrochlorothiazide yesterday. In the ER patient was found to have a hemoglobin of 9 which is 3 g drop from previous and patient states she also noticed black stools last 2 days. Stool for occult blood is positive. Patient states while patient was having the syncopal episode she may have caught her leg into the cabinet and patient has pain in the right foot which x-rays show has dislocated right second toe. ER physician was unable to fix it. Orthopedics was consulted for dislocation of toe. She underwent an EGD by Gastroenterology which was unremarkable. Cardiology also consulted for management of Atrial Fibrillation. Patient's Hb/Hct remained stable and she underwent a Colonoscopy today which showed Diverticulosis in the entire examined colon and a 9 mm polyp in the cecum as well as internal hemorrhoids. Because of her colonoscopy findings not explaining her acute melena and anemia she will undergo a small bowel capsule study today and will have her diet advanced per SB capsule protocol.   Assessment & Plan:   Principal Problem:    Syncope Active Problems:   Essential hypertension   PAF (paroxysmal atrial fibrillation) (HCC)   S/P placement of cardiac pacemaker, medtronic adapta 01/21/12   PVD (peripheral vascular disease), hx stents to bil SFAs 02/2010   CAD (coronary artery disease)   S/P CABG x 3 01/04/15   Febrile illness, acute   Paroxysmal atrial flutter (HCC)   Chronic diastolic heart failure (HCC)   Hypercalcemia   Gastrointestinal hemorrhage   Acute blood loss anemia   CKD (chronic kidney disease), stage II   Chronic anticoagulation   Benign neoplasm of ascending colon  Acute GI Bleed s/p transfusion of 2 units of pRBC -Denies taking any NSAIDs. Continue Holding Xarelto and Plavix for now.  -Follow CBC. -EGD done showed normal esophagus, normal stomach, and normal examined duodenem; Patient has a Hx of Diverticulitis.  -Patient's Hb/Hct went from 9.6/28.8 -> 9.9/29.5 -C/w Protonix gtt discontined and patient now on Pantoprazole 40 mg Daily -Will need Colonoscopy and will target December 21, 2015 as patient's last Plavix dose was 11/6 -Colonoscopy today showed Diverticulosis in the entire examined colon. One 9 mm polyp in the cecum, removed with a cold snare. Resected and retrieved. Normal appearing end-to-side colo-colonic anastomosis (from remote colon surgery for complicated diverticular disease). Internal hemorrhoids. -Restart Heparin gtt per GI recc's at [facility] today; Continue to Hold Xarelto and Plavix -Will undergo Small Bowel Capsule Study today.  -Appreciate GI Recc's  Hypotension in the setting of likely Acute Upper GI Bleed, improved -BP has been ranging from 98/47 - 177/68 -Has a Hx of Hypertension -Cardiology decreased Lisinopril from 30 mg daily to 10 mg daily  and also decreased Carvedilol to 3.125 mg BID.  -Is s/p Bolus NS 500 mL over 2 Hours and Transfusion of 2 units of pRBC -Continue to Monitor VS  Syncope likely 2/2 to Volume Loss and Orthostasis -Likely from Volume Depletion and  Blood Loss -Cards and GI following -S/p transfusion of 2 units of pRBC -C/w Telemetry; If reoccurs will obtain Echocardiogram and Orthostatic Vital Signs  Right Foot Second Toe Dislocation -Orthopedics Dr. Herbert Seta and performed Closed Reduction and Buddy Tape with Cast Shoe as needed -Will Follow up As an Outpatient and have Repeat Imaging at Office  CAD status post CABG and recent stents  -s/p CABG 1 year ago -Cardiology following -Held Plavix. Discussed with Cardiology about Xarelto and will start Heparin gtt instead of Xarelto for better control. Talked with patient and she agrees to start Heparin gtt. *Heparin gtt held 6 hours prior to Colonoscopy and will be restarted at [facility] today* -Had Chest pain few days ago and if returns Cardiology Recommends SL NTG as patient had Anginal Equivalent. -C/w Carvedilol 3.125 mg po BID and with Lisinopril 10 mg daily.  -Per Cards will Resume Plavix when ok with GI  Paroxysmal Atrial fibrillation/ Atrial Flutter -Renally dosed Xarelto on hold due to GI bleed. On Heparin gtt for Colonoscopy\ -PER Cardiology Dr. Stanford Breed recommend to resume Xarelto when bleeding improves and ok with GI -CHADS2VASC elevated at 6 -C/w Amiodarone 100 mg daily and Coreg 3.125 mg po BID -C/w Telemetry  Acute Blood Loss Anemia s/p Transfusion of 2 Units of pRBC -S/p Transfusion of 2 units of pRBC and follow CBC. -Hb/Hct Stable at 9.9/29.5  Hypercalcemia, improved -Calcium was 10.3 today  Anxiety/Depression -Continue with Citalopram 10 mg Daily  DVT prophylaxis: SCDs; Heparin gtt Code Status: Do Not Intubate Family Communication: No family at Bedside.  Disposition Plan: PT to Evaluate and Treat; Disposition pending  Consultants:  Cardiology Gastroenterology Orthopedic Surgery  Procedures: EGD today; Colonoscopy 12/21/15; Small Bowel Capsule Study  Antimicrobials: None  Subjective: Seen and examined at bedside after colonoscopy and felt sleepy.  Stated she had a good night and had no CP/SOB/N/V or Abdominal Pain. No other concerns or complaints. To undergo small bowel Capsule study today.  Objective: Vitals:   12/21/15 1007 12/21/15 1010 12/21/15 1015 12/21/15 1300  BP:  (!) 122/47  122/71  Pulse:  75  77  Resp:  (!) 22  18  Temp:    98.1 F (36.7 C)  TempSrc:    Oral  SpO2: 100% 100% 100% 100%  Weight:      Height:        Intake/Output Summary (Last 24 hours) at 12/21/15 1526 Last data filed at 12/21/15 0700  Gross per 24 hour  Intake            317.5 ml  Output                0 ml  Net            317.5 ml   Filed Weights   12/17/15 0248  Weight: 61.6 kg (135 lb 12.8 oz)   Examination: Physical Exam:  Constitutional: WN/WD, NAD calm and comfortable laying in bed Eyes: Lids normal and minimal conjunctival pallor. Sclerae anicteric  ENMT: External Ears, Nose appear normal. Grossly normal hearing. Mucous membranes appeared moist.  Neck: Appears normal, supple, no cervical masses, normal ROM, no appreciable thyromegaly, no JVD Respiratory: Clear to auscultation bilaterally, no wheezing, rales, rhonchi or crackles. Normal respiratory effort and  patient is not tachypenic. No accessory muscle use.  Cardiovascular: RRR, no murmurs / rubs / gallops. S1 and S2 auscultated. No extremity edema. 2+ pedal pulses.   Abdomen: Soft, non-tender, non-distended. No masses palpated. No appreciable hepatosplenomegaly. Bowel sounds positive x4.  GU: Deferred. Musculoskeletal: Right 2nd toe buddy taped to 3rd.  Skin: No rashes, lesions, ulcers. No induration; Warm and dry.  Neurologic: CN 2-12 grossly intact with no focal deficits. Sensation intact in all 4 Extremities. Romberg sign cerebellar reflexes not assessed.  Psychiatric: Normal judgment and insight. Alert and oriented x 3. Normal mood and pleasant affect.   Data Reviewed: I have personally reviewed following labs and imaging studies  CBC:  Recent Labs Lab 12/17/15 1404  12/18/15 0456 12/19/15 0451 12/19/15 1929 12/20/15 0401 12/21/15 0452  WBC 7.6 5.9 6.4  --  5.4 5.5  NEUTROABS  --  2.7  --   --   --  2.7  HGB 8.4* 7.7* 9.9* 9.5* 9.6* 9.9*  HCT 25.0* 23.2* 29.2* 28.2* 28.8* 29.5*  MCV 82.5 85.3 85.1  --  84.0 86.0  PLT 232 227 183  --  204 [id number]   Basic Metabolic Panel:  Recent Labs Lab 12/17/15 0647 12/18/15 0456 12/19/15 0451 12/20/15 0401 12/21/15 0452  NA 137 137 135 137 139  K 3.8 4.6 4.1 4.7 4.7  CL 104 105 109 107 109  CO2 27 27 24 27 25   GLUCOSE 127* 110* 118* 118* 91  BUN 45* 35* 30* 18 10  CREATININE 0.91 0.86 0.97 0.77 0.72  CALCIUM 10.4* 10.3 9.7 9.8 10.3  MG  --   --  2.0 1.8 1.9  PHOS  --   --  2.9 2.6 3.0   GFR: Estimated Creatinine Clearance: 52.5 mL/min (by C-G formula based on SCr of 0.72 mg/dL). Liver Function Tests:  Recent Labs Lab 12/17/15 0647 12/19/15 0451 12/20/15 0401 12/21/15 0452  AST 19 24 25 24   ALT 15 16 18 19   ALKPHOS 70 62 62 68  BILITOT 0.6 1.3* 0.4 0.5  PROT 5.8* 5.9* 5.9* 5.8*  ALBUMIN 3.4* 3.5 3.5 3.7   No results for input(s): LIPASE, AMYLASE in the last 168 hours. No results for input(s): AMMONIA in the last 168 hours. Coagulation Profile:  Recent Labs Lab 12/18/15 1710  INR 0.97   Cardiac Enzymes:  Recent Labs Lab 12/17/15 0647  TROPONINI <0.03   BNP (last 3 results) No results for input(s): PROBNP in the last 8760 hours. HbA1C: No results for input(s): HGBA1C in the last 72 hours. CBG:  Recent Labs Lab 12/20/15 0034 12/20/15 0826 12/20/15 1650 12/21/15 0147 12/21/15 0817  GLUCAP 115* 109* 77 98 77   Lipid Profile: No results for input(s): CHOL, HDL, LDLCALC, TRIG, CHOLHDL, LDLDIRECT in the last 72 hours. Thyroid Function Tests: No results for input(s): TSH, T4TOTAL, FREET4, T3FREE, THYROIDAB in the last 72 hours. Anemia Panel: No results for input(s): VITAMINB12, FOLATE, FERRITIN, TIBC, IRON, RETICCTPCT in the last 72 hours. Sepsis Labs: No results for  input(s): PROCALCITON, LATICACIDVEN in the last 168 hours.  No results found for this or any previous visit (from the past 240 hour(s)).   Radiology Studies: No results found. Scheduled Meds: . amiodarone  100 mg Oral Daily  . bupivacaine (PF)  10-30 mL Infiltration Once  . carvedilol  3.125 mg Oral BID WC  . citalopram  10 mg Oral Daily  . Influenza vac split quadrivalent PF  0.5 mL Intramuscular Tomorrow-1000  . lidocaine  10-50 mL  Intradermal Once  . lisinopril  20 mg Oral Daily  . pantoprazole  40 mg Oral Daily   Continuous Infusions: . heparin 850 Units/hr (12/21/15 1241)    LOS: 4 days   Kerney Elbe, DO [facility] Pager [phone]  If 7PM-7AM, please contact night-coverage [email or web address] Password [email or web address] 12/21/2015, 3:26 PM

## 2015-12-21 NOTE — Progress Notes (Signed)
Pharmacy - IV heparin  Assessment:    Please see note from Peggyann Juba, PharmD earlier today for full details.  Briefly, 75 y.o. female on IV heparin while off Xarelto for Afib    Heparin resumed after colonoscopy  Most recent heparin level subtherapeutic at 0.2 units/ml on 850 units/hr  Note patient was previously therapeutic and stable on this infusion rate  Plan:   As patient was previously stable on 850 units/hr, I suspect patient has not reached steady state concentrations and will not increase rate at this time. Would normally bolus to bring levels up, but given recent colonoscopy will avoid this measure as well.  Will recheck level with AM labs and adjust as appropriate.  Reuel Boom, PharmD, BCPS Pager: [phone] 12/21/2015, 7:36 PM

## 2015-12-21 NOTE — Progress Notes (Signed)
Patient Name: [name] Date of Encounter: 12/21/2015  Primary Cardiologist: Dr. Delila Pereyra Problem List     Principal Problem:   Syncope Active Problems:   Essential hypertension   PAF (paroxysmal atrial fibrillation) (HCC)   S/P placement of cardiac pacemaker, medtronic adapta 01/21/12   PVD (peripheral vascular disease), hx stents to bil SFAs 02/2010   CAD (coronary artery disease)   S/P CABG x 3 01/04/15   Febrile illness, acute   Paroxysmal atrial flutter (HCC)   Chronic diastolic heart failure (HCC)   Hypercalcemia   Gastrointestinal hemorrhage   Acute blood loss anemia   CKD (chronic kidney disease), stage II   Chronic anticoagulation    Subjective   Denies any chest pain or palpitations. Continues with hematochezia  Inpatient Medications    Scheduled Meds: . amiodarone  100 mg Oral Daily  . bupivacaine (PF)  10-30 mL Infiltration Once  . carvedilol  3.125 mg Oral BID WC  . citalopram  10 mg Oral Daily  . Influenza vac split quadrivalent PF  0.5 mL Intramuscular Tomorrow-1000  . lidocaine  10-50 mL Intradermal Once  . lisinopril  10 mg Oral Daily  . pantoprazole  40 mg Oral Daily   Continuous Infusions: . sodium chloride     PRN Meds: acetaminophen **OR** acetaminophen, acetaminophen, nitroGLYCERIN, ondansetron **OR** ondansetron (ZOFRAN) IV   Vital Signs    Vitals:   12/20/15 2005 12/20/15 2143 12/21/15 0140 12/21/15 0629  BP: (!) 194/92 (!) 204/93 139/78 (!) 148/71  Pulse: 81 77 81 75  Resp: 18   18  Temp: 98 F (36.7 C)   97.2 F (36.2 C)  TempSrc: Oral   Oral  SpO2: 100% 100%  100%  Weight:      Height:        Intake/Output Summary (Last 24 hours) at 12/21/15 0841 Last data filed at 12/21/15 0700  Gross per 24 hour  Intake              940 ml  Output                0 ml  Net              940 ml   Filed Weights   12/17/15 0248  Weight: 135 lb 12.8 oz (61.6 kg)    Physical Exam    GEN: Well nourished, well developed  Caucasian female appearing in no acute distress.  HEENT: Grossly normal.  Neck: Supple Cardiac: RRR Respiratory:  CTA GI: Soft, nontender, nondistended MS: no deformity or atrophy. Skin: warm and dry, no rash. Neuro:  Strength and sensation are intact. Psych: AAOx3.  Normal affect.  Labs    CBC  Recent Labs  12/20/15 0401 12/21/15 0452  WBC 5.4 5.5  NEUTROABS  --  2.7  HGB 9.6* 9.9*  HCT 28.8* 29.5*  MCV 84.0 86.0  PLT 204 Q000111Q   Basic Metabolic Panel  Recent Labs  12/20/15 0401 12/21/15 0452  NA 137 139  K 4.7 4.7  CL 107 109  CO2 27 25  GLUCOSE 118* 91  BUN 18 10  CREATININE 0.77 0.72  CALCIUM 9.8 10.3  MG 1.8 1.9  PHOS 2.6 3.0   Liver Function Tests  Recent Labs  12/20/15 0401 12/21/15 0452  AST 25 24  ALT 18 19  ALKPHOS 62 68  BILITOT 0.4 0.5  PROT 5.9* 5.8*  ALBUMIN 3.5 3.7     Radiology    Dg Toe  2nd Right  Result Date: 12/17/2015 CLINICAL DATA:  Right second toe dislocation status post reduction EXAM: RIGHT SECOND TOE COMPARISON:  Film from earlier in the same day FINDINGS: The reduction of the PIP joint has been performed. Anatomic alignment is now well exchanged. A tiny bony density is again seen adjacent to the distal aspect of the proximal phalanx which may be related to a small avulsion. No other focal abnormality is noted. IMPRESSION: Status post reduction with possible small avulsion. Electronically Signed   By: Inez Catalina M.D.   On: 12/17/2015 17:13   Dg Toe 2nd Right  Result Date: 12/17/2015 CLINICAL DATA:  Right second toe pain after a fall today. Syncopal episode. EXAM: RIGHT SECOND TOE COMPARISON:  None. FINDINGS: Dorsal dislocation of the middle phalanx of the right second toe with respect to the proximal phalanx with tiny bone fragment inferiorly suggesting an avulsion fracture. Soft tissue swelling. IMPRESSION: Dislocation of the proximal interphalangeal joint of the right second toe with tiny avulsion fracture. Electronically  Signed   By: Lucienne Capers M.D.   On: 12/17/2015 01:29    Cardiac Studies   Echocardiogram: 12/04/2015 Study Conclusions  - Left ventricle: The cavity size was normal. Systolic function was   normal. The estimated ejection fraction was in the range of 55%   to 60%. Wall motion was normal; there were no regional wall   motion abnormalities. There was an increased relative   contribution of atrial contraction to ventricular filling.   Doppler parameters are consistent with abnormal left ventricular   relaxation (grade 1 diastolic dysfunction). - Aortic valve: Trileaflet; mildly thickened, mildly calcified   leaflets. There was moderate regurgitation.  Patient Profile     49F w/ PMH of CAD (s/p CABGx3 and LAA clipping in 12/2014, NSTEMI 05/2015 s/p DES to native RCA and SVG-dRCA; occluded ramus-SVG was treated medically), paroxysmal atrial fib, tachybrady syndrome (s/p PPM 2013), CKD stage II, orthostatic hypotension, PAD (s/p prior LE stenting), alcohol abuse, HTN, HLD, hyperkalemia, hypercalcemia, chronic diastolic CHF, moderate AI, syncope, & multiple medication intolerances who [facility]/2015 with a GIB.  Assessment & Plan    1. GIB/ABL anemia  - Xarelto and Plavix on hold per primary team. Transfused 2 units pRBC's on 11/8. EGD performed on 11/8 and unremarkable. Plans are for a colonoscopy on 11/11. - GI following  2. Syncope  - suspect related to blood pressure drop in the setting of volume loss and recent orthostasis. No repeat episodes since being admitted. - telemetry showing A-paced rhythm  3. Paroxysmal atrial fib/flutter  - This patients CHA2DS2-VASc Score and unadjusted Ischemic Stroke Rate (% per year) is equal to 9.7 % stroke rate/year from a score of 6 (HTN, Vascular, Female, Age (2)), DM). Xarelto held in preparation for colonoscopy. Receiving Heparin bridge. Resume xarelto when bleeding improves and ok with GI - Continue Amiodarone 100mg  daily and  Coreg 3.125mg  BID.  4. CAD  - s/p CABG in 12/2014, PCI 05/2015 - she is now > 6 months out from recent stenting. The original plan was to continue until April 2018 but with significant GIB, will hold Plavix for now. Resume plavix when ok with GI.  5. Stage 2 CKD - creatinine improved  6. Hypertension - BP elevated; increase lisinopril to 20 mg daily  Signed, Kirk Ruths, MD  12/21/2015, 8:41 AM

## 2015-12-21 NOTE — Progress Notes (Signed)
[place] for IV heparin (while off xarelto) Indication: atrial fibrillation  Allergies  Allergen Reactions  . Brilinta [Ticagrelor] Shortness Of Breath  . Clonidine Derivatives Other (See Comments)    Lowers heart rate  . Atorvastatin Other (See Comments)    Possible cause of fatigue/malaise  . Crestor [Rosuvastatin Calcium] Other (See Comments)    Joint/muscle aches  . Spironolactone     Contraindicated with history of hyperkalemia  . Exforge [Amlodipine Besylate-Valsartan] Itching and Rash    Patient Measurements: Height: 5\' 4"  (162.6 cm) Weight: 135 lb 12.8 oz (61.6 kg) IBW/kg (Calculated) : 54.7 Heparin Dosing Weight: 62 kg (TBW)  Vital Signs: Temp: 97.2 F (36.2 C) (11/11 0629) Temp Source: Oral (11/11 0629) BP: 122/47 (11/11 1010) Pulse Rate: 75 (11/11 1010)  Labs:  Recent Labs  12/18/15 1710  12/19/15 0451 12/19/15 1321 12/19/15 1929 12/20/15 0401 12/21/15 0452  HGB  --   < > 9.9*  --  9.5* 9.6* 9.9*  HCT  --   < > 29.2*  --  28.2* 28.8* 29.5*  PLT  --   --  183  --   --  204 223  APTT 20*  --   --   --   --   --   --   LABPROT 12.9  --   --   --   --   --   --   INR 0.97  --   --   --   --   --   --   HEPARINUNFRC <0.10*  --  0.42 0.50  --  0.50  --   CREATININE  --   --  0.97  --   --  0.77 0.72  < > = values in this interval not displayed.  Estimated Creatinine Clearance: 52.5 mL/min (by C-G formula based on SCr of 0.72 mg/dL).   Medical History: Past Medical History:  Diagnosis Date  . ANXIETY 12/31/2009  . Aortic insufficiency    a. mod by echo 2017.  [place] CAD (coronary artery disease)    a. s/p CABGx3 and LAA clipping in 12/2014, NSTEMI 05/2015 s/p DES to native RCA and SVG-dRCA; occluded ramus-SVG was treated medically). c. neg nuc 10/2015 at [facility].  . Chronic diastolic CHF (congestive heart failure) ([place])   . Chronic fatigue   . CKD (chronic kidney disease), stage II   . DEPRESSION 12/31/2009  . [place]  DISEASE, CERVICAL 12/31/2009  . [place] DISEASE, LUMBAR 12/31/2009  . DIVERTICULITIS, HX OF 12/31/2009  . Essential hypertension   . GLUCOSE INTOLERANCE 12/31/2009  . Habitual alcohol use   . Hypercalcemia   . Hyperkalemia   . Hyperlipidemia 12/31/2009  . MENOPAUSE, EARLY 12/31/2009  . NSTEMI (non-ST elevated myocardial infarction) ([place]) 05/11/2015  . Orthostatic hypotension   . OSTEOARTHRITIS, HAND   . Pacemaker 01/21/2012   MDT Adapta dual chamber  . Paroxysmal atrial flutter ([place])   . Persistent atrial fibrillation ([place])   . Pre-diabetes   . PVD (peripheral vascular disease), hx stents to bil SFAs 02/2010   . Symptomatic sinus bradycardia 01/22/2012  . Syncope    a. 11/2015 ? due to medications.  . Tachy-brady syndrome ([place])    a. s/p MDT PPM 2013.  . Tricuspid regurgitation     Medications:  Prescriptions Prior to Admission  Medication Sig Dispense Refill Last Dose  . amiodarone (PACERONE) 100 MG tablet Take 1 tablet (100 mg total) by mouth daily. (Patient taking differently: Take 100 mg  by mouth every morning. ) 30 tablet 11 12/16/2015 at Unknown time  . carvedilol (COREG) 6.25 MG tablet Take 1 tablet (6.25 mg total) by mouth 2 (two) times daily with a meal. 60 tablet 0 12/16/2015 at 0900  . cholecalciferol (VITAMIN D) 1000 UNITS tablet Take 1,000 Units by mouth at bedtime.    12/15/2015 at Unknown time  . citalopram (CELEXA) 20 MG tablet Take 0.5 tablets (10 mg total) by mouth daily. (Patient taking differently: Take 10 mg by mouth every morning. )   12/16/2015 at Unknown time  . clopidogrel (PLAVIX) 75 MG tablet TAKE 1 TABLET (75 MG TOTAL) BY MOUTH DAILY. (Patient taking differently: Take 75 mg by mouth every morning. ) 90 tablet 1 12/16/2015 at 0900  . Coenzyme Q10 (COQ10 PO) Take 120 mg by mouth at bedtime.    12/15/2015 at Unknown time  . hydrochlorothiazide (HYDRODIURIL) 25 MG tablet Take 25 mg by mouth once.   12/16/2015 at 0900  . lisinopril (PRINIVIL,ZESTRIL) 30 MG tablet Take 1  tablet (30 mg total) by mouth daily. (Patient taking differently: Take 30 mg by mouth every morning. ) 30 tablet 0 12/16/2015 at Unknown time  . Multiple Vitamin (MULTIVITAMIN) capsule Take 1 capsule by mouth 2 (two) times daily.    12/16/2015 at Unknown time  . nitroGLYCERIN (NITROSTAT) 0.4 MG SL tablet Place 1 tablet (0.4 mg total) under the tongue every 5 (five) minutes x 3 doses as needed for chest pain. 25 tablet 2 Past Week at Unknown time  . rivaroxaban (XARELTO) 15 MG TABS tablet Take 1 tablet (15 mg total) by mouth daily with supper. 30 tablet 5 12/16/2015 at 1800   Scheduled:  . amiodarone  100 mg Oral Daily  . bupivacaine (PF)  10-30 mL Infiltration Once  . carvedilol  3.125 mg Oral BID WC  . citalopram  10 mg Oral Daily  . Influenza vac split quadrivalent PF  0.5 mL Intramuscular Tomorrow-1000  . lidocaine  10-50 mL Intradermal Once  . lisinopril  20 mg Oral Daily  . pantoprazole  40 mg Oral Daily    Assessment: 75 y.o. female with past medical history of CAD status post CABG and stent placement in March 2017 maintained on Plavix and Xarelto, hypertension, paroxysmal A. fib, PVD, aortic insufficiency and tricuspid regurgitation and presented to the ER due to LOC. Hemoccult positive and patient reports melena, but EGD without findings. Plavix and Xarelto held in anticipation of colonoscopy on 11/11. Cardiology would like to initiate heparin given high CHADS2VASc score = 6   Baseline INR, aPTT, HL: pending (INR 1.41 on 10/26 - consistent with Xarelto use)  Prior anticoagulation: Xarelto (LD 11/6 at 1800), Plavix (LD 11/6 at 0900)  Significant events: 11/8: ordered 2 units PRBC; EGD on 11/8 with no signif findings 11/9: reported black stool this morning 11/11: Heparin off at 02:00 this AM for colonoscopy. Ok per GI to resume heparin (no bolus) at 12:00 today  Goal of Therapy: Heparin level 0.3-0.7 units/ml Monitor platelets by anticoagulation protocol: Yes  Plan:  At 12:00,  resume heparin 850 units/hr (heparin level was therapeutic on this rate yesterday)  Check heparin level 8hr after resuming heparin  Daily heparin level and CBC  F/u plans for resuming Xarelto and Plavix  Peggyann Juba, PharmD, BCPS Pager: [phone] 12/21/2015 11:30 AM

## 2015-12-22 LAB — COMPREHENSIVE METABOLIC PANEL
ALT: 18 U/L (ref 14–54)
ALT: 18 U/L (ref 14–54)
AST: 25 U/L (ref 15–41)
AST: 25 U/L (ref 15–41)
Albumin: 3.6 g/dL (ref 3.5–5.0)
Albumin: 3.5 g/dL (ref 3.5–5.0)
Alkaline Phosphatase: 69 U/L (ref 38–126)
Alkaline Phosphatase: 70 U/L (ref 38–126)
Anion gap: 5 (ref 5–15)
Anion gap: 4 — ABNORMAL LOW (ref 5–15)
BUN: 12 mg/dL (ref 6–20)
BUN: 10 mg/dL (ref 6–20)
CO2: 26 mmol/L (ref 22–32)
CO2: 27 mmol/L (ref 22–32)
Calcium: 10.3 mg/dL (ref 8.9–10.3)
Calcium: 10.5 mg/dL — ABNORMAL HIGH (ref 8.9–10.3)
Chloride: 105 mmol/L (ref 101–111)
Chloride: 105 mmol/L (ref 101–111)
Creatinine, Ser: 0.84 mg/dL (ref 0.44–1.00)
Creatinine, Ser: 0.87 mg/dL (ref 0.44–1.00)
GFR calc Af Amer: >60 mL/min (ref >60)
GFR calc Af Amer: >60 mL/min (ref >60)
GFR calc non Af Amer: >60 mL/min (ref >60)
GFR calc non Af Amer: >60 mL/min (ref >60)
Glucose, Bld: 101 mg/dL — ABNORMAL HIGH (ref 65–99)
Glucose, Bld: 138 mg/dL — ABNORMAL HIGH (ref 65–99)
Potassium: 4.4 mmol/L (ref 3.5–5.1)
Potassium: 4.1 mmol/L (ref 3.5–5.1)
Sodium: 136 mmol/L (ref 135–145)
Sodium: 136 mmol/L (ref 135–145)
Total Bilirubin: 0.5 mg/dL (ref 0.3–1.2)
Total Bilirubin: 0.6 mg/dL (ref 0.3–1.2)
Total Protein: 5.9 g/dL — ABNORMAL LOW (ref 6.5–8.1)
Total Protein: 6.1 g/dL — ABNORMAL LOW (ref 6.5–8.1)

## 2015-12-22 LAB — CBC WITH DIFFERENTIAL/PLATELET
Basophils Absolute: 0 10*3/uL (ref 0.0–0.1)
Basophils Relative: 1 %
Eosinophils Absolute: 0.2 10*3/uL (ref 0.0–0.7)
Eosinophils Relative: 5 %
HCT: 29 % — ABNORMAL LOW (ref 36.0–46.0)
Hemoglobin: 9.6 g/dL — ABNORMAL LOW (ref 12.0–15.0)
Lymphocytes Relative: 43 %
Lymphs Abs: 2.3 10*3/uL (ref 0.7–4.0)
MCH: 28.4 pg (ref 26.0–34.0)
MCHC: 33.1 g/dL (ref 30.0–36.0)
MCV: 85.8 fL (ref 78.0–100.0)
Monocytes Absolute: 0.4 10*3/uL (ref 0.1–1.0)
Monocytes Relative: 8 %
Neutro Abs: 2.3 10*3/uL (ref 1.7–7.7)
Neutrophils Relative %: 43 %
Platelets: 231 10*3/uL (ref 150–400)
RBC: 3.38 MIL/uL — ABNORMAL LOW (ref 3.87–5.11)
RDW: 17.3 % — ABNORMAL HIGH (ref 11.5–15.5)
WBC: 5.3 10*3/uL (ref 4.0–10.5)

## 2015-12-22 LAB — MAGNESIUM: Magnesium: 1.9 mg/dL (ref 1.7–2.4)

## 2015-12-22 LAB — PHOSPHORUS: Phosphorus: 4 mg/dL (ref 2.5–4.6)

## 2015-12-22 LAB — GLUCOSE, CAPILLARY
Glucose-Capillary: 105 mg/dL — ABNORMAL HIGH (ref 65–99)
Glucose-Capillary: 106 mg/dL — ABNORMAL HIGH (ref 65–99)
Glucose-Capillary: 128 mg/dL — ABNORMAL HIGH (ref 65–99)
Glucose-Capillary: 95 mg/dL (ref 65–99)

## 2015-12-22 LAB — HEPARIN LEVEL (UNFRACTIONATED)
Heparin Unfractionated: 0.33 IU/mL (ref 0.30–0.70)
Heparin Unfractionated: 0.35 IU/mL (ref 0.30–0.70)

## 2015-12-22 NOTE — Progress Notes (Signed)
[facility] Progress Note    Since last GI note: Small bowel capsule testing and colonoscopy yesterday.    NO overt GI bleeding. She is back on heparin for severe heart disease.  Objective: Vital signs in last 24 hours: Temp:  [98.1 F (36.7 C)-98.2 F (36.8 C)] 98.1 F (36.7 C) (11/12 0634) Pulse Rate:  [75-81] 81 (11/11 2327) Resp:  [11-22] 16 (11/11 2327) BP: (98-177)/(41-96) 135/70 (11/12 0634) SpO2:  [95 %-100 %] 95 % (11/12 0634) Last BM Date: 12/21/15 General: alert and oriented times 3 Heart: regular rate and rythm Abdomen: soft, non-tender, non-distended, normal bowel sounds   Lab Results:  Recent Labs  12/20/15 0401 12/21/15 0452 12/22/15 0440  WBC 5.4 5.5 5.3  HGB 9.6* 9.9* 9.6*  PLT 204 223 231  MCV 84.0 86.0 85.8    Recent Labs  12/20/15 0401 12/21/15 0452 12/22/15 0440  NA 137 139 136  K 4.7 4.7 4.4  CL 107 109 105  CO2 27 25 26   GLUCOSE 118* 91 101*  BUN 18 10 12   CREATININE 0.77 0.72 0.84  CALCIUM 9.8 10.3 10.3    Recent Labs  12/20/15 0401 12/21/15 0452 12/22/15 0440  PROT 5.9* 5.8* 5.9*  ALBUMIN 3.5 3.7 3.6  AST 25 24 25   ALT 18 19 18   ALKPHOS 62 68 69  BILITOT 0.4 0.5 0.5    Medications: Scheduled Meds: . amiodarone  100 mg Oral Daily  . bupivacaine (PF)  10-30 mL Infiltration Once  . carvedilol  3.125 mg Oral BID WC  . citalopram  10 mg Oral Daily  . Influenza vac split quadrivalent PF  0.5 mL Intramuscular Tomorrow-1000  . lidocaine  10-50 mL Intradermal Once  . lisinopril  20 mg Oral Daily  . pantoprazole  40 mg Oral Daily   Continuous Infusions: . sodium chloride    . heparin 850 Units/hr (12/21/15 2148)   PRN Meds:.acetaminophen **OR** acetaminophen, acetaminophen, diphenhydrAMINE, nitroGLYCERIN, ondansetron **OR** ondansetron (ZOFRAN) IV    Assessment/Plan: 75 y.o. female with FOBT +, dark stools, anemia  This is in the setting of severe heart disease requiring both plavix and Xarelto  Hb  7.7 up to 9.6 after two units PRBC and no further signs of overt bleeding even while on heparin bridge.  Patient reported "black sticky" stools since the end of October on a daily basis, episode of syncope 11/15/15,she blames on her blood pressure medications, no history of upper GI symptoms; EGD 12/18/15 unremarkable; Colonoscopy 12/21/15 found normal left sided anastomosis, a few remaining tics throughout the colon and a 58mm polyp in the cecum (removed).  Small bowel capsule placed and heparin resumed yesterday.  The small bowel capsule images will be reviewed tomorrow. She should stay on heparin for now.  Will advance to heart healthy diet.  If the capsule study shows site that explains her melena, anemia she may need small bowel enteroscopy (if the lesion is proximal enough to reach by that method). If no obvious sites to explain her melena, anemia then I recommend restarting her usual oral blood thinners (plavix and xarelto) and observe her clinically. She will need to be on daily iron as well.   Milus Banister, MD  12/22/2015, 8:14 AM [facility] Pager [phone]

## 2015-12-22 NOTE — Progress Notes (Signed)
[facility] for IV heparin (while off xarelto) Indication: atrial fibrillation  Allergies  Allergen Reactions  . Brilinta [Ticagrelor] Shortness Of Breath  . Clonidine Derivatives Other (See Comments)    Lowers heart rate  . Atorvastatin Other (See Comments)    Possible cause of fatigue/malaise  . Crestor [Rosuvastatin Calcium] Other (See Comments)    Joint/muscle aches  . Spironolactone     Contraindicated with history of hyperkalemia  . Exforge [Amlodipine Besylate-Valsartan] Itching and Rash    Patient Measurements: Height: 5\' 4"  (162.6 cm) Weight: 135 lb 12.8 oz (61.6 kg) IBW/kg (Calculated) : 54.7 Heparin Dosing Weight: 62 kg (TBW)  Vital Signs: Temp: 98.1 F (36.7 C) (11/12 0634) Temp Source: Oral (11/12 0634) BP: 135/70 (11/12 0634) Pulse Rate: 81 (11/11 2327)  Labs:  Recent Labs  12/20/15 0401 12/21/15 0452 12/21/15 2014 12/22/15 0440  HGB 9.6* 9.9*  --  9.6*  HCT 28.8* 29.5*  --  29.0*  PLT 204 223  --  231  HEPARINUNFRC 0.50  --  0.20* 0.33  CREATININE 0.77 0.72  --  0.84    Estimated Creatinine Clearance: 50 mL/min (by C-G formula based on SCr of 0.84 mg/dL).   Medical History: Past Medical History:  Diagnosis Date  . ANXIETY 12/31/2009  . Aortic insufficiency    a. mod by echo 2017.  [place] Kitchen CAD (coronary artery disease)    a. s/p CABGx3 and LAA clipping in 12/2014, NSTEMI 05/2015 s/p DES to native RCA and SVG-dRCA; occluded ramus-SVG was treated medically). c. neg nuc 10/2015 at [facility].  . Chronic diastolic CHF (congestive heart failure) ([facility])   . Chronic fatigue   . CKD (chronic kidney disease), stage II   . DEPRESSION 12/31/2009  . [place] DISEASE, CERVICAL 12/31/2009  . [place] DISEASE, LUMBAR 12/31/2009  . DIVERTICULITIS, HX OF 12/31/2009  . Essential hypertension   . GLUCOSE INTOLERANCE 12/31/2009  . Habitual alcohol use   . Hypercalcemia   . Hyperkalemia   . Hyperlipidemia 12/31/2009  . MENOPAUSE, EARLY  12/31/2009  . NSTEMI (non-ST elevated myocardial infarction) ([place]) 05/11/2015  . Orthostatic hypotension   . OSTEOARTHRITIS, HAND   . Pacemaker 01/21/2012   MDT Adapta dual chamber  . Paroxysmal atrial flutter ([place])   . Persistent atrial fibrillation ([place])   . Pre-diabetes   . PVD (peripheral vascular disease), hx stents to bil SFAs 02/2010   . Symptomatic sinus bradycardia 01/22/2012  . Syncope    a. 11/2015 ? due to medications.  . Tachy-brady syndrome ([place])    a. s/p MDT PPM 2013.  . Tricuspid regurgitation     Medications:  Prescriptions Prior to Admission  Medication Sig Dispense Refill Last Dose  . amiodarone (PACERONE) 100 MG tablet Take 1 tablet (100 mg total) by mouth daily. (Patient taking differently: Take 100 mg by mouth every morning. ) 30 tablet 11 12/16/2015 at Unknown time  . carvedilol (COREG) 6.25 MG tablet Take 1 tablet (6.25 mg total) by mouth 2 (two) times daily with a meal. 60 tablet 0 12/16/2015 at 0900  . cholecalciferol (VITAMIN D) 1000 UNITS tablet Take 1,000 Units by mouth at bedtime.    12/15/2015 at Unknown time  . citalopram (CELEXA) 20 MG tablet Take 0.5 tablets (10 mg total) by mouth daily. (Patient taking differently: Take 10 mg by mouth every morning. )   12/16/2015 at Unknown time  . clopidogrel (PLAVIX) 75 MG tablet TAKE 1 TABLET (75 MG TOTAL) BY MOUTH DAILY. (Patient taking differently: Take  75 mg by mouth every morning. ) 90 tablet 1 12/16/2015 at 0900  . Coenzyme Q10 (COQ10 PO) Take 120 mg by mouth at bedtime.    12/15/2015 at Unknown time  . hydrochlorothiazide (HYDRODIURIL) 25 MG tablet Take 25 mg by mouth once.   12/16/2015 at 0900  . lisinopril (PRINIVIL,ZESTRIL) 30 MG tablet Take 1 tablet (30 mg total) by mouth daily. (Patient taking differently: Take 30 mg by mouth every morning. ) 30 tablet 0 12/16/2015 at Unknown time  . Multiple Vitamin (MULTIVITAMIN) capsule Take 1 capsule by mouth 2 (two) times daily.    12/16/2015 at Unknown time  . nitroGLYCERIN  (NITROSTAT) 0.4 MG SL tablet Place 1 tablet (0.4 mg total) under the tongue every 5 (five) minutes x 3 doses as needed for chest pain. 25 tablet 2 Past Week at Unknown time  . rivaroxaban (XARELTO) 15 MG TABS tablet Take 1 tablet (15 mg total) by mouth daily with supper. 30 tablet 5 12/16/2015 at 1800   Scheduled:  . amiodarone  100 mg Oral Daily  . bupivacaine (PF)  10-30 mL Infiltration Once  . carvedilol  3.125 mg Oral BID WC  . citalopram  10 mg Oral Daily  . Influenza vac split quadrivalent PF  0.5 mL Intramuscular Tomorrow-1000  . lidocaine  10-50 mL Intradermal Once  . lisinopril  20 mg Oral Daily  . pantoprazole  40 mg Oral Daily    Assessment: 75 y.o. female with past medical history of CAD status post CABG and stent placement in March 2017 maintained on Plavix and Xarelto, hypertension, paroxysmal A. fib, PVD, aortic insufficiency and tricuspid regurgitation and presented to the ER due to LOC. Hemoccult positive and patient reports melena, but EGD without findings. Plavix and Xarelto held in anticipation of colonoscopy on 11/11. Cardiology would like to initiate heparin given high CHADS2VASc score = 6   Baseline INR, aPTT, HL: pending (INR 1.41 on 10/26 - consistent with Xarelto use)  Prior anticoagulation: Xarelto (LD 11/6 at 1800), Plavix (LD 11/6 at 0900)  Significant events: 11/8: ordered 2 units PRBC; EGD on 11/8 with no signif findings 11/9: reported black stool this morning 11/11: Heparin off at 02:00 this AM for colonoscopy which showed diverticulosis. Ok per GI to resume heparin (no bolus) at 12:00 today. To undergo small owel capsle study today.  Today, 12/22/2015  Heparin level therapeutic (0.33) on 850 units/hr  Hgb low, unchanged overnight; Plts stable  No bleeding reported  Goal of Therapy: Heparin level 0.3-0.7 units/ml Monitor platelets by anticoagulation protocol: Yes  Plan:  Continue heparin 850 units/hr (heparin level was therapeutic on this rate  yesterday)  Daily heparin level and CBC  F/u plans for resuming Xarelto and Plavix when bleeding improves  Peggyann Juba, PharmD, BCPS Pager: [phone] 12/22/2015 8:24 AM

## 2015-12-22 NOTE — Progress Notes (Signed)
[place] for IV heparin (while off xarelto) Indication: atrial fibrillation  Allergies  Allergen Reactions  . Brilinta [Ticagrelor] Shortness Of Breath  . Clonidine Derivatives Other (See Comments)    Lowers heart rate  . Atorvastatin Other (See Comments)    Possible cause of fatigue/malaise  . Crestor [Rosuvastatin Calcium] Other (See Comments)    Joint/muscle aches  . Spironolactone     Contraindicated with history of hyperkalemia  . Exforge [Amlodipine Besylate-Valsartan] Itching and Rash    Patient Measurements: Height: 5\' 4"  (162.6 cm) Weight: 135 lb 12.8 oz (61.6 kg) IBW/kg (Calculated) : 54.7 Heparin Dosing Weight: 62 kg (TBW)  Vital Signs: Temp: 98.1 F (36.7 C) (11/12 0634) Temp Source: Oral (11/12 0634) BP: 135/70 (11/12 0857)  Labs:  Recent Labs  12/20/15 0401 12/21/15 0452 12/21/15 2014 12/22/15 0440 12/22/15 1152 12/22/15 1509  HGB 9.6* 9.9*  --  9.6*  --   --   HCT 28.8* 29.5*  --  29.0*  --   --   PLT 204 223  --  231  --   --   HEPARINUNFRC 0.50  --  0.20* 0.33  --  0.35  CREATININE 0.77 0.72  --  0.84 0.87  --     Estimated Creatinine Clearance: 48.2 mL/min (by C-G formula based on SCr of 0.87 mg/dL).   Medical History: Past Medical History:  Diagnosis Date  . ANXIETY 12/31/2009  . Aortic insufficiency    a. mod by echo 2017.  Marland Kitchen CAD (coronary artery disease)    a. s/p CABGx3 and LAA clipping in 12/2014, NSTEMI 05/2015 s/p DES to native RCA and SVG-dRCA; occluded ramus-SVG was treated medically). c. neg nuc 10/2015 at [facility].  . Chronic diastolic CHF (congestive heart failure) ([place])   . Chronic fatigue   . CKD (chronic kidney disease), stage II   . DEPRESSION 12/31/2009  . [place] DISEASE, CERVICAL 12/31/2009  . [place] DISEASE, LUMBAR 12/31/2009  . DIVERTICULITIS, HX OF 12/31/2009  . Essential hypertension   . GLUCOSE INTOLERANCE 12/31/2009  . Habitual alcohol use   . Hypercalcemia   . Hyperkalemia   .  Hyperlipidemia 12/31/2009  . MENOPAUSE, EARLY 12/31/2009  . NSTEMI (non-ST elevated myocardial infarction) ([place]) 05/11/2015  . Orthostatic hypotension   . OSTEOARTHRITIS, HAND   . Pacemaker 01/21/2012   MDT Adapta dual chamber  . Paroxysmal atrial flutter ([place])   . Persistent atrial fibrillation ([place])   . Pre-diabetes   . PVD (peripheral vascular disease), hx stents to bil SFAs 02/2010   . Symptomatic sinus bradycardia 01/22/2012  . Syncope    a. 11/2015 ? due to medications.  . Tachy-brady syndrome ([place])    a. s/p MDT PPM 2013.  . Tricuspid regurgitation     Medications:  Prescriptions Prior to Admission  Medication Sig Dispense Refill Last Dose  . amiodarone (PACERONE) 100 MG tablet Take 1 tablet (100 mg total) by mouth daily. (Patient taking differently: Take 100 mg by mouth every morning. ) 30 tablet 11 12/16/2015 at Unknown time  . carvedilol (COREG) 6.25 MG tablet Take 1 tablet (6.25 mg total) by mouth 2 (two) times daily with a meal. 60 tablet 0 12/16/2015 at 0900  . cholecalciferol (VITAMIN D) 1000 UNITS tablet Take 1,000 Units by mouth at bedtime.    12/15/2015 at Unknown time  . citalopram (CELEXA) 20 MG tablet Take 0.5 tablets (10 mg total) by mouth daily. (Patient taking differently: Take 10 mg by mouth every morning. )  12/16/2015 at Unknown time  . clopidogrel (PLAVIX) 75 MG tablet TAKE 1 TABLET (75 MG TOTAL) BY MOUTH DAILY. (Patient taking differently: Take 75 mg by mouth every morning. ) 90 tablet 1 12/16/2015 at 0900  . Coenzyme Q10 (COQ10 PO) Take 120 mg by mouth at bedtime.    12/15/2015 at Unknown time  . hydrochlorothiazide (HYDRODIURIL) 25 MG tablet Take 25 mg by mouth once.   12/16/2015 at 0900  . lisinopril (PRINIVIL,ZESTRIL) 30 MG tablet Take 1 tablet (30 mg total) by mouth daily. (Patient taking differently: Take 30 mg by mouth every morning. ) 30 tablet 0 12/16/2015 at Unknown time  . Multiple Vitamin (MULTIVITAMIN) capsule Take 1 capsule by mouth 2 (two) times daily.     12/16/2015 at Unknown time  . nitroGLYCERIN (NITROSTAT) 0.4 MG SL tablet Place 1 tablet (0.4 mg total) under the tongue every 5 (five) minutes x 3 doses as needed for chest pain. 25 tablet 2 Past Week at Unknown time  . rivaroxaban (XARELTO) 15 MG TABS tablet Take 1 tablet (15 mg total) by mouth daily with supper. 30 tablet 5 12/16/2015 at 1800   Scheduled:  . amiodarone  100 mg Oral Daily  . bupivacaine (PF)  10-30 mL Infiltration Once  . carvedilol  3.125 mg Oral BID WC  . citalopram  10 mg Oral Daily  . Influenza vac split quadrivalent PF  0.5 mL Intramuscular Tomorrow-1000  . lidocaine  10-50 mL Intradermal Once  . lisinopril  20 mg Oral Daily  . pantoprazole  40 mg Oral Daily    Assessment: 75 y.o. female with past medical history of CAD status post CABG and stent placement in March 2017 maintained on Plavix and Xarelto, hypertension, paroxysmal A. fib, PVD, aortic insufficiency and tricuspid regurgitation and presented to the ER due to LOC. Hemoccult positive and patient reports melena, but EGD without findings. Plavix and Xarelto held in anticipation of colonoscopy on 11/11. Cardiology would like to initiate heparin given high CHADS2VASc score = 6   Baseline INR, aPTT, HL: pending (INR 1.41 on 10/26 - consistent with Xarelto use)  Prior anticoagulation: Xarelto (LD 11/6 at 1800), Plavix (LD 11/6 at 0900)  Significant events: 11/8: ordered 2 units PRBC; EGD on 11/8 with no signif findings 11/9: reported black stool this morning 11/11: Heparin off at 02:00 this AM for colonoscopy which showed diverticulosis. Ok per GI to resume heparin (no bolus) at 12:00 today. To undergo small owel capsle study today.  Today, 12/22/2015  Heparin level therapeutic (0.33) on 850 units/hr, recheck this afternoon also therapeutic (0.35)  Hgb low, unchanged overnight; Plts stable  No bleeding reported  Goal of Therapy: Heparin level 0.3-0.7 units/ml Monitor platelets by anticoagulation  protocol: Yes  Plan:  Continue heparin 850 units/hr   Daily heparin level and CBC  F/u plans for resuming Xarelto and Plavix after results of small bowel capsule study  Peggyann Juba, PharmD, BCPS Pager: [phone] 12/22/2015 3:39 PM

## 2015-12-22 NOTE — Progress Notes (Signed)
PROGRESS NOTE    [name]  [id number] DOB: 12/21/1940 DOA: 12/16/2015 PCP: [name], MD  Brief Narrative:  [name] is a 75 y.o. female with history of CAD status post CABG and stents placement recently in March 2017, Hypertension, paroxysmal atrial fibrillation and other comorbidites who presented to the ER because of loss of consciousness. Patient was recently admitted twice for uncontrolled blood pressure and syncopal episode was brought to the ER after patient had syncopal episode last night around 9 PM while walking to the bathroom. Patient states her granddaughter helped her to the floor but she did loose consciousness briefly. Did not have any incontinence of bowel or urine. Denied any chest pain or shortness of breath. Since last discharge patient has been very careful in getting out of the bed due to dizziness. Patient accidentally took hydrochlorothiazide yesterday. In the ER patient was found to have a hemoglobin of 9 which is 3 g drop from previous and patient states she also noticed black stools last 2 days. Stool for occult blood is positive. Patient states while patient was having the syncopal episode she may have caught her leg into the cabinet and patient has pain in the right foot which x-rays show has dislocated right second toe. ER physician was unable to fix it. Orthopedics was consulted for dislocation of toe. She underwent an EGD by Gastroenterology which was unremarkable. Cardiology also consulted for management of Atrial Fibrillation. Patient's Hb/Hct remained stable and she underwent a Colonoscopy today which showed Diverticulosis in the entire examined colon and a 9 mm polyp in the cecum as well as internal hemorrhoids. Because of her colonoscopy findings not explaining her acute melena and anemia she underwent Small bowel Capsule imaging that will be reviewed tomorrow. She may need Small bowel Enteroscopy if capsule study shows site that would explain her  Melena. GI Continuing to follow.  Assessment & Plan:   Principal Problem:   Syncope Active Problems:   Essential hypertension   PAF (paroxysmal atrial fibrillation) (HCC)   S/P placement of cardiac pacemaker, medtronic adapta 01/21/12   PVD (peripheral vascular disease), hx stents to bil SFAs 02/2010   CAD (coronary artery disease)   S/P CABG x 3 01/04/15   Febrile illness, acute   Paroxysmal atrial flutter (HCC)   Chronic diastolic heart failure (HCC)   Hypercalcemia   Gastrointestinal hemorrhage   Acute blood loss anemia   CKD (chronic kidney disease), stage II   Chronic anticoagulation   Benign neoplasm of ascending colon   Pain of toe of right foot  Acute GI Bleed s/p transfusion of 2 units of pRBC -Denies taking any NSAIDs. Continue Holding Xarelto and Plavix for now.  -Follow CBC. -EGD done 12/18/2015 showed normal esophagus, normal stomach, and normal examined duodenem; Patient has a Hx of Diverticulitis.  -Patient's Hb/Hct went from 9.6/28.8 -> 9.9/29.5 -C/w Protonix gtt discontined and patient now on Pantoprazole 40 mg Daily -Will need Colonoscopy and will target December 21, 2015 as patient's last Plavix dose was 11/6 -Colonoscopy 12/21/2015 showed Diverticulosis in the entire examined colon. One 9 mm polyp in the cecum, removed with a cold snare. Resected and retrieved. Normal appearing end-to-side colo-colonic anastomosis (from remote colon surgery for complicated diverticular disease). Internal hemorrhoids. -Restarted Heparin gtt per GI recc's at [facility] yesterday; Continue to Hold Xarelto and Plavix for now. -Small Bowel Capsule Study done yesterday (12/21/2015) and Imaging will be reviewed tomororrow. Possible Small Bowel Enteroscopy based on imaging if Melena site explained. If  no obvious sites to explain Melena per GI can restart her blood thinners and observe clinically. -Appreciate GI Recc's; C/w Heart Healthy Diet  Hypotension in the setting of likely Acute Upper GI  Bleed, improved -BP has been ranging from 121/52 - 135/70 -Has a Hx of Hypertension -Cardiology decreased Lisinopril from 30 mg daily to 10 mg daily and also decreased Carvedilol to 3.125 mg BID.  -Is s/p Bolus NS 500 mL over 2 Hours and Transfusion of 2 units of pRBC -Continue to Monitor VS  Syncope likely 2/2 to Volume Loss and Orthostasis -No Reoccurance -Likely from Volume Depletion and Blood Loss -Cards and GI following -S/p transfusion of 2 units of pRBC -C/w Telemetry; If reoccurs will obtain Echocardiogram and Orthostatic Vital Signs  Right Foot Second Toe Dislocation -Orthopedics Dr. Herbert Seta and performed Closed Reduction and Buddy Tape with Cast Shoe as needed -Will Follow up As an Outpatient and have Repeat Imaging at Office  CAD status post CABG and recent stents  -s/p CABG 1 year ago -Cardiology following -Held Plavix. Discussed with Cardiology about Xarelto and will start Heparin gtt instead of Xarelto for better control.  -Had Chest pain few days ago and if returns Cardiology Recommends SL NTG as patient had Anginal Equivalent. -C/w Carvedilol 3.125 mg po BID and with Lisinopril 10 mg daily.  -Per Cards will Resume Plavix when ok with GI (GI may do Enteroscopy based on Small Bowel Capsule Study findings).   Paroxysmal Atrial fibrillation/ Atrial Flutter -Renally dosed Xarelto on hold due to GI bleed. On Heparin gtt for Colonoscopy -PER Cardiology Dr. Stanford Breed recommend to resume Xarelto when bleeding improves and ok with GI (GI may do Enteroscopy based on Small Bowel Capsule Study findings).  -CHADS2VASC elevated at 6 -C/w Amiodarone 100 mg daily and Coreg 3.125 mg po BID -C/w Telemetry  Acute Blood Loss Anemia s/p Transfusion of 2 Units of pRBC -S/p Transfusion of 2 units of pRBC and follow CBC. -Hb/Hct Stable at 9.6/29.0 -Per GI will likely need Iron Replacement Therapy  Hypercalcemia, improved -Calcium was again 10.3  today  Anxiety/Depression -Continue with Citalopram 10 mg Daily  DVT prophylaxis: SCDs; Heparin gtt Code Status: Do Not Intubate Family Communication: No family at Bedside.  Disposition Plan: PT to Evaluate and Treat; Disposition pending  Consultants:  Cardiology Gastroenterology Orthopedic Surgery  Procedures: EGD 12/18/2015; Colonoscopy 12/21/15; Small Bowel Capsule Study 12/18/2015; Possible Enteroscopy  Antimicrobials: None  Subjective: Seen and examined at bedside and was doing well. Explained to her that her Small Bowel Capsule Study Imaging would be reviewed tomorrow. No active complaints or concerns and denies Cp, SOB, N/V or Abdominal Pain or Lightheadedness/Dizziness.   Objective: Vitals:   12/21/15 1300 12/21/15 2327 12/22/15 0634 12/22/15 0857  BP: 122/71 (!) 121/52 135/70 135/70  Pulse: 77 81    Resp: 18 16    Temp: 98.1 F (36.7 C) 98.2 F (36.8 C) 98.1 F (36.7 C)   TempSrc: Oral Oral Oral   SpO2: 100% 100% 95%   Weight:      Height:        Intake/Output Summary (Last 24 hours) at 12/22/15 1118 Last data filed at 12/22/15 0514  Gross per 24 hour  Intake             1002 ml  Output             2200 ml  Net            -1198 ml   [employer]  12/17/15 0248  Weight: 61.6 kg (135 lb 12.8 oz)   Examination: Physical Exam:  Constitutional: WN/WD, NAD calm and comfortable sitting up in bed eating breakfast Eyes: Lids normal and minimal conjunctival pallor. Sclerae anicteric  ENMT: External Ears, Nose appear normal. Grossly normal hearing. Mucous membranes appeared moist.  Neck: Appears normal, supple, no cervical masses, normal ROM, no appreciable thyromegaly, no JVD Respiratory: Clear to auscultation bilaterally, no wheezing, rales, rhonchi or crackles. Normal respiratory effort and patient is not tachypenic. No accessory muscle use.  Cardiovascular: RRR, no murmurs / rubs / gallops. S1 and S2 auscultated. No extremity edema. 2+ pedal pulses.    Abdomen: Soft, non-tender, non-distended. No masses palpated. No appreciable hepatosplenomegaly. Bowel sounds positive x4.  GU: Deferred. Musculoskeletal: Right 2nd toe buddy taped to 3rd.  Skin: No rashes, lesions, ulcers. No induration; Warm and dry.  Neurologic: CN 2-12 grossly intact with no focal deficits. Sensation intact in all 4 Extremities. Romberg sign cerebellar reflexes not assessed.  Psychiatric: Normal judgment and insight. Alert and oriented x 3. Normal mood and pleasant affect.   Data Reviewed: I have personally reviewed following labs and imaging studies  CBC:  Recent Labs Lab 12/18/15 0456 12/19/15 0451 12/19/15 1929 12/20/15 0401 12/21/15 0452 12/22/15 0440  WBC 5.9 6.4  --  5.4 5.5 5.3  NEUTROABS 2.7  --   --   --  2.7 2.3  HGB 7.7* 9.9* 9.5* 9.6* 9.9* 9.6*  HCT 23.2* 29.2* 28.2* 28.8* 29.5* 29.0*  MCV 85.3 85.1  --  84.0 86.0 85.8  PLT 227 183  --  204 223 [id number]   Basic Metabolic Panel:  Recent Labs Lab 12/18/15 0456 12/19/15 0451 12/20/15 0401 12/21/15 0452 12/22/15 0440  NA 137 135 137 139 136  K 4.6 4.1 4.7 4.7 4.4  CL 105 109 107 109 105  CO2 27 24 27 25 26   GLUCOSE 110* 118* 118* 91 101*  BUN 35* 30* 18 10 12   CREATININE 0.86 0.97 0.77 0.72 0.84  CALCIUM 10.3 9.7 9.8 10.3 10.3  MG  --  2.0 1.8 1.9 1.9  PHOS  --  2.9 2.6 3.0 4.0   GFR: Estimated Creatinine Clearance: 50 mL/min (by C-G formula based on SCr of 0.84 mg/dL). Liver Function Tests:  Recent Labs Lab 12/17/15 0647 12/19/15 0451 12/20/15 0401 12/21/15 0452 12/22/15 0440  AST 19 24 25 24 25   ALT 15 16 18 19 18   ALKPHOS 70 62 62 68 69  BILITOT 0.6 1.3* 0.4 0.5 0.5  PROT 5.8* 5.9* 5.9* 5.8* 5.9*  ALBUMIN 3.4* 3.5 3.5 3.7 3.6   No results for input(s): LIPASE, AMYLASE in the last 168 hours. No results for input(s): AMMONIA in the last 168 hours. Coagulation Profile:  Recent Labs Lab 12/18/15 1710  INR 0.97   Cardiac Enzymes:  Recent Labs Lab 12/17/15 0647   TROPONINI <0.03   BNP (last 3 results) No results for input(s): PROBNP in the last 8760 hours. HbA1C: No results for input(s): HGBA1C in the last 72 hours. CBG:  Recent Labs Lab 12/21/15 0147 12/21/15 0817 12/21/15 1635 12/22/15 0122 12/22/15 0819  GLUCAP 98 77 81 105* 95   Lipid Profile: No results for input(s): CHOL, HDL, LDLCALC, TRIG, CHOLHDL, LDLDIRECT in the last 72 hours. Thyroid Function Tests: No results for input(s): TSH, T4TOTAL, FREET4, T3FREE, THYROIDAB in the last 72 hours. Anemia Panel: No results for input(s): VITAMINB12, FOLATE, FERRITIN, TIBC, IRON, RETICCTPCT in the last 72 hours. Sepsis Labs: No results for  input(s): PROCALCITON, LATICACIDVEN in the last 168 hours.  No results found for this or any previous visit (from the past 240 hour(s)).   Radiology Studies: No results found. Scheduled Meds: . amiodarone  100 mg Oral Daily  . bupivacaine (PF)  10-30 mL Infiltration Once  . carvedilol  3.125 mg Oral BID WC  . citalopram  10 mg Oral Daily  . Influenza vac split quadrivalent PF  0.5 mL Intramuscular Tomorrow-1000  . lidocaine  10-50 mL Intradermal Once  . lisinopril  20 mg Oral Daily  . pantoprazole  40 mg Oral Daily   Continuous Infusions: . sodium chloride    . heparin 850 Units/hr (12/21/15 2148)    LOS: 5 days   Kerney Elbe, DO [facility] Pager [phone]  If 7PM-7AM, please contact night-coverage [email or web address] Password [email or web address] 12/22/2015, 11:18 AM

## 2015-12-22 NOTE — Evaluation (Signed)
Physical Therapy Evaluation Patient Details Name: [name] MRN: [id number] DOB: January 31, 1941 Today's Date: 12/22/2015   History of Present Illness  75 yo female admitted with syncope, PAfib, GI bleed, R 2nd toe disclocation sustained during fall after syncopal episode-s/p closed reduction 11/7. Hx of HTN, PAF, CAD, CABG, HF, pacemaker  Clinical Impression  On eval, pt was Min guard assist for mobility. She walked ~50 feet. Pt reported feeling weak and tired. BP 97/52 after short walk. Assisted pt back to bed at her request. Encouraged pt to walk with nursing as tolerated/able. Will follow and progress activity as able. At this time, will recommend HHPT follow up. However, pt may not need HH if she progresses well during hospital stay.     Follow Up Recommendations Supervision - Intermittent;Home health PT (depending on progress)    Equipment Recommendations  No equipment needs   Recommendations for Other Services       Precautions / Restrictions Precautions Precautions: Fall Required Braces or Orthoses: Other Brace/Splint Other Brace/Splint: post op shoe for R foot Restrictions Weight Bearing Restrictions: No      Mobility  Bed Mobility Overal bed mobility: Modified Independent                Transfers Overall transfer level: Needs assistance   Transfers: Sit to/from Stand Sit to Stand: Supervision         General transfer comment: for safety  Ambulation/Gait Ambulation/Gait assistance: Min guard Ambulation Distance (Feet): 50 Feet Assistive device: None Gait Pattern/deviations: Step-through pattern     General Gait Details: close guard for safety. Pt reports feeling "weak, tired". she tolerated short distance well.   Stairs            Wheelchair Mobility    Modified Rankin (Stroke Patients Only)       Balance Overall balance assessment: Needs assistance           Standing balance-Leahy Scale: Good                                Pertinent Vitals/Pain Pain Assessment: No/denies pain    Home Living Family/patient expects to be discharged to:: Private residence Living Arrangements: Alone (granddaughter) Available Help at Discharge: Family (daughter checks on pt frequently; granddaughter "in and out/in school") Type of Home: House Home Access: Stairs to enter Entrance Stairs-Rails: Right Entrance Stairs-Number of Steps: 5 Home Layout: Able to live on main level with bedroom/bathroom Home Equipment: None      Prior Function Level of Independence: Independent         Comments: Pt works out at [employer], but has not been able to do this lately.      Hand Dominance        Extremity/Trunk Assessment   Upper Extremity Assessment: Overall WFL for tasks assessed           Lower Extremity Assessment: Overall WFL for tasks assessed      Cervical / Trunk Assessment: Normal  Communication   Communication: No difficulties  Cognition Arousal/Alertness: Awake/alert Behavior During Therapy: WFL for tasks assessed/performed Overall Cognitive Status: Within Functional Limits for tasks assessed                      General Comments      Exercises     Assessment/Plan    PT Assessment Patient needs continued PT services  PT Problem List Decreased mobility;Decreased activity  tolerance;Decreased strength          PT Treatment Interventions Gait training;Therapeutic activities;Therapeutic exercise;Patient/family education;Functional mobility training    PT Goals (Current goals can be found in the Care Plan section)  Acute Rehab PT Goals Patient Stated Goal: I want to get back to doing normal things and going to the gym. I don't like for things to slow me down.  PT Goal Formulation: With patient Time For Goal Achievement: 01/05/16 Potential to Achieve Goals: Good    Frequency Min 3X/week   Barriers to discharge        Co-evaluation               End of  Session Equipment Utilized During Treatment: Gait belt Activity Tolerance: Patient limited by fatigue Patient left: in bed;with call bell/phone within reach           Time: [id number] PT Time Calculation (min) (ACUTE ONLY): 14 min   Charges:   PT Evaluation $PT Eval Low Complexity: 1 Procedure     PT G Codes:        Weston Anna, MPT Pager: [phone]

## 2015-12-23 ENCOUNTER — Encounter (HOSPITAL_COMMUNITY): Payer: Self-pay | Admitting: Gastroenterology

## 2015-12-23 DIAGNOSIS — I739 Peripheral vascular disease, unspecified: Secondary | ICD-10-CM

## 2015-12-23 DIAGNOSIS — R195 Other fecal abnormalities: Secondary | ICD-10-CM

## 2015-12-23 DIAGNOSIS — Z950 Presence of cardiac pacemaker: Secondary | ICD-10-CM

## 2015-12-23 LAB — COMPREHENSIVE METABOLIC PANEL
ALT: 20 U/L (ref 14–54)
AST: 25 U/L (ref 15–41)
Albumin: 3.5 g/dL (ref 3.5–5.0)
Alkaline Phosphatase: 66 U/L (ref 38–126)
Anion gap: 6 (ref 5–15)
BUN: 13 mg/dL (ref 6–20)
CO2: 22 mmol/L (ref 22–32)
Calcium: 10.4 mg/dL — ABNORMAL HIGH (ref 8.9–10.3)
Chloride: 110 mmol/L (ref 101–111)
Creatinine, Ser: 0.86 mg/dL (ref 0.44–1.00)
GFR calc Af Amer: >60 mL/min (ref >60)
GFR calc non Af Amer: >60 mL/min (ref >60)
Glucose, Bld: 104 mg/dL — ABNORMAL HIGH (ref 65–99)
Potassium: 4.6 mmol/L (ref 3.5–5.1)
Sodium: 138 mmol/L (ref 135–145)
Total Bilirubin: 0.3 mg/dL (ref 0.3–1.2)
Total Protein: 6.1 g/dL — ABNORMAL LOW (ref 6.5–8.1)

## 2015-12-23 LAB — CBC
HCT: 29.6 % — ABNORMAL LOW (ref 36.0–46.0)
Hemoglobin: 9.8 g/dL — ABNORMAL LOW (ref 12.0–15.0)
MCH: 28.4 pg (ref 26.0–34.0)
MCHC: 33.1 g/dL (ref 30.0–36.0)
MCV: 85.8 fL (ref 78.0–100.0)
Platelets: 256 10*3/uL (ref 150–400)
RBC: 3.45 MIL/uL — ABNORMAL LOW (ref 3.87–5.11)
RDW: 17.2 % — ABNORMAL HIGH (ref 11.5–15.5)
WBC: 5.1 10*3/uL (ref 4.0–10.5)

## 2015-12-23 LAB — HEPARIN LEVEL (UNFRACTIONATED): Heparin Unfractionated: 0.29 IU/mL — ABNORMAL LOW (ref 0.30–0.70)

## 2015-12-23 LAB — MAGNESIUM: Magnesium: 2 mg/dL (ref 1.7–2.4)

## 2015-12-23 LAB — PHOSPHORUS: Phosphorus: 3.7 mg/dL (ref 2.5–4.6)

## 2015-12-23 MED ORDER — DIPHENHYDRAMINE HCL 25 MG PO CAPS
25.0000 mg | ORAL_CAPSULE | Freq: Four times a day (QID) | ORAL | Status: DC | PRN
  Administered 2015-12-23: 25 mg via ORAL
  Filled 2015-12-23: qty 1

## 2015-12-23 MED ORDER — CARVEDILOL 3.125 MG PO TABS: 3.1250 mg | ORAL_TABLET | Freq: Two times a day (BID) | ORAL | 0 refills | Status: DC

## 2015-12-23 MED ORDER — LISINOPRIL 20 MG PO TABS: 20.0000 mg | ORAL_TABLET | Freq: Every day | ORAL | 0 refills | Status: DC

## 2015-12-23 MED ORDER — APIXABAN 5 MG PO TABS: 5.0000 mg | ORAL_TABLET | Freq: Two times a day (BID) | ORAL | 0 refills | Status: DC

## 2015-12-23 NOTE — Care Management Note (Signed)
Case Management Note  Patient Details  Name: [name] MRN: [id number] Date of Birth: 1940-08-08  Subjective/Objective:30 day free trial eliquis coupon given-patient voiced understanding.                    Action/Plan:d/c home w/HHC   Expected Discharge Date:                  Expected Discharge Plan:  [place]  In-House Referral:     Discharge planning Services  CM Consult  Post Acute Care Choice:    Choice offered to:  Patient  DME Arranged:    DME Agency:     HH Arranged:  RN, PT [place] Agency:  [facility]  Status of Service:  Completed, signed off  If discussed at [place] of Stay Meetings, dates discussed:    Additional Comments:  Dessa Phi, RN 12/23/2015, 2:21 PM

## 2015-12-23 NOTE — Discharge Summary (Signed)
Physician Discharge Summary  [name] [id number] DOB: December 04, 1940 DOA: 12/16/2015  PCP: [name], MD  Admit date: 12/16/2015 Discharge date: 12/23/2015  Admitted From: Home Disposition:  [facility]  Recommendations for Outpatient Follow-up:  1. Follow up with PCP in 1-2 weeks 2. Follow up with Cardiology Dr. Sallyanne Kuster in 1 week 3. Follow up with GI Dr. Ardis Hughs in 1 week 4. Please obtain BMP/CBC in one week  Home Health: Yes Equipment/Devices: None  Discharge Condition: Stable CODE STATUS: FULL Diet recommendation: Heart Healthy   Brief/Interim Summary: [name] a 75 y.o.femalewith history of CAD status post CABG and stents placement recently in March 2017, Hypertension, paroxysmal atrial fibrillation and other comorbidites who presented to the ER because of loss of consciousness. Patient was recently admitted twice for uncontrolled blood pressure and syncopal episode was brought to the ER after patient had syncopal episode last night around 9 PM while walking to the bathroom. Patient states her granddaughter helpedher to the floor but she did looseconsciousness briefly. Did not have any incontinence of bowel or urine. Denied any chest pain or shortness of breath. Since last discharge patient has been very careful in getting out of the bed due to dizziness. Patient accidentally took hydrochlorothiazide yesterday. In the ER patient was found to have a hemoglobin of 9 which is 3 g drop from previous and patient states she also noticed black stools last 2 days. Stool for occult blood is positive. Patient states while patient was having the syncopal episode she may have caught her leg into the cabinet and patient has pain in the right foot which x-rays show has dislocated right second toe. ER physician was unable to fix it.Orthopedics was consulted for dislocation of toe. She underwent an EGD by Gastroenterology which was unremarkable. Cardiology also  consulted for management of Atrial Fibrillation. Patient's Hb/Hct remained stable and she underwent a Colonoscopy 12/21/2015 which showed Diverticulosis in the entire examined colon and a 9 mm polyp in the cecum as well as internal hemorrhoids. Because of her colonoscopy findings not explaining her acute melena and anemia she underwent Small bowel Capsule which was negative. Per GI she needed to wait to start Anticoagulation back for at least 2 weeks. Cardiology discussed with the patient and she will be transitioned to Apixaban after 2 weeks because of less side-effect profile for GI Bleeding. Patient is medically stable and able to be D/C'd Home today and will follow  Up with PCP, Cardiology, an Gastroenterology as an outpatient.   Discharge Diagnoses:  Principal Problem:   Syncope Active Problems:   Essential hypertension   PAF (paroxysmal atrial fibrillation) (HCC)   S/P placement of cardiac pacemaker, medtronic adapta 01/21/12   PVD (peripheral vascular disease), hx stents to bil SFAs 02/2010   CAD (coronary artery disease)   S/P CABG x 3 01/04/15   Febrile illness, acute   Paroxysmal atrial flutter (HCC)   Chronic diastolic heart failure (HCC)   Hypercalcemia   Gastrointestinal hemorrhage   Acute blood loss anemia   CKD (chronic kidney disease), stage II   Chronic anticoagulation   Benign neoplasm of ascending colon   Pain of toe of right foot   Heme positive stool  Acute GI Bleed s/p transfusion of 2 units of pRBC -Denies taking any NSAIDs.  -Follow CBC as an outpatient  -EGD done 12/18/2015 showed normal esophagus, normal stomach, and normal examined duodenem; Patient has a Hx of Diverticulitis.  -Patient's Hb/Hct went from 9.6/28.8 -> 9.9/29.5 ->  9.8/29.6 -C/w Protonix gtt discontined as well as Pantoprazole 40 mg Daily; Will defer to GI as an outpatient to restart as EGD was negative -Colonoscopy 12/21/2015 showed Diverticulosis in the entire examined colon. One 9 mm polyp in  the cecum, removed with a cold snare. Resected and retrieved. Normal appearing end-to-side colo-colonic anastomosis (from remote colon surgery for complicated diverticular disease). Internal hemorrhoids. -D/C'd Xarelto and Plavix -Small Bowel Capsule Study done yesterday (12/21/2015) and Imaging was Negative and was normal with no evidence of small bowel bleeding source. -Appreciated GI Recc's; C/w Heart Healthy Diet -From GI standpoint they prefer she be placed back on only one of her blood thinners over the next 2 weeks as she is at some risk for post polypectomy bleeding during that time frame. -Discussed with Cardiology and patient will be started on Eliquis 5 mg po BID in 2 weeks.  Hypotension in the setting of likely Acute Upper GI Bleed, improved -Cardiology decreased Lisinopril from 30 mg daily to 20 mg daily and also decreased Carvedilol to 3.125 mg BID. -Continue these as an outpatient -Continue to Monitor VS  Syncope likely 2/2 to Volume Loss and Orthostasis -No Reoccurance -Likely from Volume Depletion and Blood Loss -Cards and GI followied -S/p transfusion of 2 units of pRBC -Follow up as an Outpatient  Right Foot Second Toe Dislocation -Orthopedics Dr. Herbert Seta and performed Closed Reduction and Buddy Tape with Cast Shoe as needed -Will Follow up As an Outpatient and have Repeat Imaging at Office  CAD status post CABG and recent stents -s/p CABG 1 year ago -Cardiology followed -C/w Carvedilol 3.125 mg po BID and with Lisinopril 20 mg daily.  -Ok to stop Plavix as >6 months since PCI per Cards  Paroxysmal Atrial fibrillation/ Atrial Flutter -CHADS2VASC elevated at 6 -C/w Amiodarone 100 mg daily and Coreg 3.125 mg po BID -Cardiology recommended stopping Xarelto and starting Apixaban 5 mg BID after 2 weeks to allow healing of unidentified bleeding site and polpectomy site.   Acute Blood Loss Anemia s/p Transfusion of 2 Units of pRBC -S/p Transfusion of 2  units of pRBC and follow CBC. -Hb/Hct Stable at 9.8/29.6 -Per GI will likely need Iron Replacement Therapy as an outpatient   Hypercalcemia, improved -Calcium was again 10.4  Anxiety/Depression -Continue with Citalopram 10 mg Daily   Discharge Instructions  Discharge Instructions    Call MD for:  difficulty breathing, headache or visual disturbances    Complete by:  As directed    Call MD for:  persistant dizziness or light-headedness    Complete by:  As directed    Call MD for:  persistant nausea and vomiting    Complete by:  As directed    Diet - low sodium heart healthy    Complete by:  As directed    Increase activity slowly    Complete by:  As directed        Medication List    STOP taking these medications   clopidogrel 75 MG tablet Commonly known as:  PLAVIX   hydrochlorothiazide 25 MG tablet Commonly known as:  HYDRODIURIL   Rivaroxaban 15 MG Tabs tablet Commonly known as:  XARELTO     TAKE these medications   amiodarone 100 MG tablet Commonly known as:  PACERONE Take 1 tablet (100 mg total) by mouth daily. What changed:  when to take this   apixaban 5 MG Tabs tablet Commonly known as:  ELIQUIS Take 1 tablet (5 mg total) by mouth 2 (two) times  daily. Start taking on:  01/06/2016   carvedilol 3.125 MG tablet Commonly known as:  COREG Take 1 tablet (3.125 mg total) by mouth 2 (two) times daily with a meal. What changed:  medication strength  how much to take   cholecalciferol 1000 units tablet Commonly known as:  VITAMIN D Take 1,000 Units by mouth at bedtime.   citalopram 20 MG tablet Commonly known as:  CELEXA Take 0.5 tablets (10 mg total) by mouth daily. What changed:  when to take this   COQ10 PO Take 120 mg by mouth at bedtime.   lisinopril 20 MG tablet Commonly known as:  PRINIVIL,ZESTRIL Take 1 tablet (20 mg total) by mouth daily. Start taking on:  12/24/2015 What changed:  medication strength  how much to take    multivitamin capsule Take 1 capsule by mouth 2 (two) times daily.   nitroGLYCERIN 0.4 MG SL tablet Commonly known as:  NITROSTAT Place 1 tablet (0.4 mg total) under the tongue every 5 (five) minutes x 3 doses as needed for chest pain.      Follow-up Information    [name], MD Follow up.   Specialty:  Family Medicine Contact information: [place] [address] [phone]        Sanda Klein, MD Follow up.   Specialty:  Cardiology Contact information: [address] [place] [address] [phone]        Milus Banister, MD Follow up in 1 week(s).   Specialty:  Gastroenterology Contact information: [address] [place] [address] [phone]        [facility] Follow up.   Why:  [place] [place] [facility]. Contact information: [place] [address] [phone]          Allergies  Allergen Reactions  . Brilinta [Ticagrelor] Shortness Of Breath  . Clonidine Derivatives Other (See Comments)    Lowers heart rate  . Atorvastatin Other (See Comments)    Possible cause of fatigue/malaise  . Crestor [Rosuvastatin Calcium] Other (See Comments)    Joint/muscle aches  . Spironolactone     Contraindicated with history of hyperkalemia  . Exforge [Amlodipine Besylate-Valsartan] Itching and Rash   Consultations: Gastroenterology Cardiology  Procedures/Studies: Dg Chest 2 View  Result Date: 12/02/2015 CLINICAL DATA:  Chest pain, shortness of breath EXAM: CHEST  2 VIEW COMPARISON:  CT chest 08/26/2015 FINDINGS: There is no focal parenchymal opacity. There is no pleural effusion or pneumothorax. There is mild stable cardiomegaly. There is a dual lead AICD. There is evidence of prior CABG. The osseous structures are unremarkable. IMPRESSION: No active cardiopulmonary disease. Electronically Signed   By: Kathreen Devoid   On: 12/02/2015 16:00   Ct Head Wo  Contrast  Result Date: 12/02/2015 CLINICAL DATA:  Initial evaluation for acute headache. EXAM: CT HEAD WITHOUT CONTRAST TECHNIQUE: Contiguous axial images were obtained from the base of the skull through the vertex without intravenous contrast. COMPARISON:  None. FINDINGS: Brain: Generalized cerebral atrophy with chronic microvascular ischemic disease. No acute intracranial hemorrhage. No evidence for acute large vessel territory infarct. No mass lesion, midline shift or mass effect. No hydrocephalus. No extra-axial fluid collection. Vascular: No hyperdense vessel. Scattered vascular calcifications noted. Skull: Scalp soft tissues within normal limits.  Calvarium intact. Sinuses/Orbits: Globes and orbital soft tissues within normal limits. Patient is status post lens extraction on the left. Paranasal sinuses and mastoid air cells are clear. IMPRESSION: 1. No acute intracranial process.  2. Generalized age-related cerebral atrophy with moderate chronic microvascular ischemic disease. Electronically Signed   By: Jeannine Boga M.D.   On: 12/02/2015 20:50   Dg Chest Port 1 View  Result Date: 12/05/2015 CLINICAL DATA:  Chest pain and shortness of Breath EXAM: PORTABLE CHEST 1 VIEW COMPARISON:  12/02/2015 FINDINGS: Cardiac shadow is stable. Bilateral breast implants are noted. A pacemaker is again seen and stable. Postsurgical changes are again noted. No focal infiltrate or sizable effusion is seen. No bony abnormality is noted. IMPRESSION: No acute abnormality seen. No significant change from the prior exam. Electronically Signed   By: Inez Catalina M.D.   On: 12/05/2015 16:58   Dg Toe 2nd Right  Result Date: 12/17/2015 CLINICAL DATA:  Right second toe dislocation status post reduction EXAM: RIGHT SECOND TOE COMPARISON:  Film from earlier in the same day FINDINGS: The reduction of the PIP joint has been performed. Anatomic alignment is now well exchanged. A tiny bony density is again seen adjacent to the  distal aspect of the proximal phalanx which may be related to a small avulsion. No other focal abnormality is noted. IMPRESSION: Status post reduction with possible small avulsion. Electronically Signed   By: Inez Catalina M.D.   On: 12/17/2015 17:13   Dg Toe 2nd Right  Result Date: 12/17/2015 CLINICAL DATA:  Right second toe pain after a fall today. Syncopal episode. EXAM: RIGHT SECOND TOE COMPARISON:  None. FINDINGS: Dorsal dislocation of the middle phalanx of the right second toe with respect to the proximal phalanx with tiny bone fragment inferiorly suggesting an avulsion fracture. Soft tissue swelling. IMPRESSION: Dislocation of the proximal interphalangeal joint of the right second toe with tiny avulsion fracture. Electronically Signed   By: Lucienne Capers M.D.   On: 12/17/2015 01:29   EGD Normal esophagus. - Normal stomach. - Normal examined duodenum. - No specimens collected.  COLONOSCOPY Diverticulosis in the entire examined colon. - One 9 mm polyp in the cecum, removed with a cold snare. Resected and retrieved. - Normal appearing end-to-side colo-colonic anastomosis (from remote colon surgery for complicated diverticular disease). - Internal hemorrhoids. - The examination was otherwise normal on direct and retroflexion views, including normal terminal ileum - These findings do no help explain her acute on chronic anemia, melena (in setting of dual blood thinners).  Subjective: Seen and examined at bedside and was doing well. Had no complaints. Discussed with GI and Video Capsule SB study was negative. Will D/C Home and start Apixaban after 2 weeks to allow polypectomy site to heal. No other complaints or concerns.   Discharge Exam: Vitals:   12/23/15 0900 12/23/15 0929  BP: (!) 123/48 (!) 123/48  Pulse: 82   Resp:    Temp:     Vitals:   12/23/15 0554 12/23/15 0626 12/23/15 0900 12/23/15 0929  BP: (!) 170/89 133/69 (!) 123/48 (!) 123/48  Pulse: 81  82   Resp: 15      Temp: 98.3 F (36.8 C)     TempSrc: Oral     SpO2: 95%     Weight:      Height:       General: Pt is alert, awake, not in acute distress Cardiovascular: RRR, S1/S2 +, no rubs, no gallops Respiratory: CTA bilaterally, no wheezing, no rhonchi Abdominal: Soft, NT, ND, bowel sounds + Extremities: no edema, no cyanosis  The results of significant diagnostics from this hospitalization (including imaging, microbiology, ancillary and laboratory) are listed below for reference.    Microbiology:  No results found for this or any previous visit (from the past 240 hour(s)).   Labs: BNP (last 3 results)  Recent Labs  06/26/15 1105 08/26/15 0330 12/02/15 1917  BNP 427.7* 647.2* [id number]*   Basic Metabolic Panel:  Recent Labs Lab 12/19/15 0451 12/20/15 0401 12/21/15 0452 12/22/15 0440 12/22/15 1152 12/23/15 0449  NA 135 137 139 136 136 138  K 4.1 4.7 4.7 4.4 4.1 4.6  CL 109 107 109 105 105 110  CO2 24 27 25 26 27 22   GLUCOSE 118* 118* 91 101* 138* 104*  BUN 30* 18 10 12 10 13   CREATININE 0.97 0.77 0.72 0.84 0.87 0.86  CALCIUM 9.7 9.8 10.3 10.3 10.5* 10.4*  MG 2.0 1.8 1.9 1.9  --  2.0  PHOS 2.9 2.6 3.0 4.0  --  3.7   Liver Function Tests:  Recent Labs Lab 12/20/15 0401 12/21/15 0452 12/22/15 0440 12/22/15 1152 12/23/15 0449  AST 25 24 25 25 25   ALT 18 19 18 18 20   ALKPHOS 62 68 69 70 66  BILITOT 0.4 0.5 0.5 0.6 0.3  PROT 5.9* 5.8* 5.9* 6.1* 6.1*  ALBUMIN 3.5 3.7 3.6 3.5 3.5   No results for input(s): LIPASE, AMYLASE in the last 168 hours. No results for input(s): AMMONIA in the last 168 hours. CBC:  Recent Labs Lab 12/18/15 0456 12/19/15 0451 12/19/15 1929 12/20/15 0401 12/21/15 0452 12/22/15 0440 12/23/15 0449  WBC 5.9 6.4  --  5.4 5.5 5.3 5.1  NEUTROABS 2.7  --   --   --  2.7 2.3  --   HGB 7.7* 9.9* 9.5* 9.6* 9.9* 9.6* 9.8*  HCT 23.2* 29.2* 28.2* 28.8* 29.5* 29.0* 29.6*  MCV 85.3 85.1  --  84.0 86.0 85.8 85.8  PLT 227 183  --  204 223 231 256    Cardiac Enzymes:  Recent Labs Lab 12/17/15 0647  TROPONINI <0.03   BNP: Invalid input(s): POCBNP CBG:  Recent Labs Lab 12/21/15 1635 12/22/15 0122 12/22/15 0819 12/22/15 1728 12/22/15 2356  GLUCAP 81 105* 95 106* 128*   D-Dimer No results for input(s): DDIMER in the last 72 hours. Hgb A1c No results for input(s): HGBA1C in the last 72 hours. Lipid Profile No results for input(s): CHOL, HDL, LDLCALC, TRIG, CHOLHDL, LDLDIRECT in the last 72 hours. Thyroid function studies No results for input(s): TSH, T4TOTAL, T3FREE, THYROIDAB in the last 72 hours.  Invalid input(s): FREET3 Anemia work up No results for input(s): VITAMINB12, FOLATE, FERRITIN, TIBC, IRON, RETICCTPCT in the last 72 hours. Urinalysis    Component Value Date/Time   COLORURINE YELLOW 12/17/2015 0348   APPEARANCEUR CLEAR 12/17/2015 0348   LABSPEC 1.018 12/17/2015 0348   PHURINE 6.0 12/17/2015 0348   GLUCOSEU NEGATIVE 12/17/2015 0348   GLUCOSEU NEGATIVE 12/31/2009 1612   HGBUR NEGATIVE 12/17/2015 0348   BILIRUBINUR NEGATIVE 12/17/2015 0348   KETONESUR NEGATIVE 12/17/2015 0348   PROTEINUR NEGATIVE 12/17/2015 0348   UROBILINOGEN 1.0 06/03/2012 1610   NITRITE NEGATIVE 12/17/2015 0348   LEUKOCYTESUR TRACE (A) 12/17/2015 0348   Sepsis Labs Invalid input(s): PROCALCITONIN,  WBC,  LACTICIDVEN Microbiology No results found for this or any previous visit (from the past 240 hour(s)).  Time coordinating discharge: Over 30 minutes  SIGNED:  Kerney Elbe, DO [facility] 12/23/2015, 1:54 PM Pager [phone]  If 7PM-7AM, please contact night-coverage [email or web address] Password [email or web address]

## 2015-12-23 NOTE — Progress Notes (Signed)
Physical Therapy Treatment Patient Details Name: [name] MRN: [id number] DOB: 06/28/1940 Today's Date: 01/04/2016    History of Present Illness 75 yo female admitted with syncope, PAfib, GI bleed, R 2nd toe disclocation sustained during fall after syncopal episode-s/p closed reduction 11/7. Hx of HTN, PAF, CAD, CABG, HF, pacemaker    PT Comments    Excellent progress today, incr gait distance, no dizziness  Follow Up Recommendations  No PT follow up     Equipment Recommendations  None recommended by PT    Recommendations for Other Services       Precautions / Restrictions Precautions Precautions: Fall Required Braces or Orthoses: Other Brace/Splint Other Brace/Splint: post op shoe for R foot Restrictions Weight Bearing Restrictions: No    Mobility  Bed Mobility Overal bed mobility: Modified Independent                Transfers Overall transfer level: Needs assistance Equipment used: None Transfers: Sit to/from Stand Sit to Stand: Supervision         General transfer comment: for safety  Ambulation/Gait Ambulation/Gait assistance: Min guard;Supervision Ambulation Distance (Feet): 240 Feet Assistive device: None Gait Pattern/deviations: Step-through pattern;Decreased stride length     General Gait Details: close guard for safety. mild instability d/t postop shoe/leglength discrepancy; educated on use of shoe on L foot at home to help with balance when wearing R post op shoe   Stairs            Wheelchair Mobility    Modified Rankin (Stroke Patients Only)       Balance                                    Cognition Arousal/Alertness: Awake/alert Behavior During Therapy: WFL for tasks assessed/performed Overall Cognitive Status: Within Functional Limits for tasks assessed                      Exercises      General Comments        Pertinent Vitals/Pain      Home Living                       Prior Function            PT Goals (current goals can now be found in the care plan section) Acute Rehab PT Goals Patient Stated Goal: I want to get back to doing normal things and going to the gym. I don't like for things to slow me down.  PT Goal Formulation: With patient Time For Goal Achievement: 01/05/16 Potential to Achieve Goals: Good Progress towards PT goals: Progressing toward goals    Frequency    Min 3X/week      PT Plan Current plan remains appropriate;Discharge plan needs to be updated    Co-evaluation             End of Session Equipment Utilized During Treatment: Gait belt Activity Tolerance: Patient tolerated treatment well Patient left: in bed;with call bell/phone within reach;with bed alarm set     Time: 1050-1110 PT Time Calculation (min) (ACUTE ONLY): 20 min  Charges:                       G CodesKenyon Ana 01-04-2016, 1:15 PM

## 2015-12-23 NOTE — Care Management Note (Signed)
Case Management Note  Patient Details  Name: [name] MRN: [id number] Date of Birth: 1940-09-24  Subjective/Objective: PT-recc HHPT; Provided patient w/HHC agency list-patient chose [facility] rep Manuela Schwartz aware of d/c & HHC orders. No further CM needs.                   Action/Plan:d/c home w/HHC.   Expected Discharge Date:                  Expected Discharge Plan:  [place]  In-House Referral:     Discharge planning Services  CM Consult  Post Acute Care Choice:    Choice offered to:  Patient  DME Arranged:    DME Agency:     HH Arranged:  RN, PT [place] Agency:  [facility]  Status of Service:  Completed, signed off  If discussed at [place] of Stay Meetings, dates discussed:    Additional Comments:  Dessa Phi, RN 12/23/2015, 1:36 PM

## 2015-12-23 NOTE — Progress Notes (Signed)
Patient ID: [name], female   DOB: [birth year], 75 y.o.   MRN: [id number]    Progress Note   Subjective  Doing well- no c/o. Denies abdominal discomfort - no stool since Colonoscopy- has not passed Capsule Denies lightheadedness ,SOB HGB 9.8 stable Pt on Heparin drip  Surgical path from cecal polyp - pending   Objective   Vital signs in last 24 hours: Temp:  [98.1 F (36.7 C)-98.3 F (36.8 C)] 98.3 F (36.8 C) (11/13 0554) Pulse Rate:  [81-84] 81 (11/13 0554) Resp:  [15-16] 15 (11/13 0554) BP: (124-170)/(63-89) 133/69 (11/13 0626) SpO2:  [95 %-100 %] 95 % (11/13 0554) Last BM Date: 12/21/15 General:  eld  white female in NAD, pleasant Heart:  Regular rate and rhythm; no murmurs Lungs: Respirations even and unlabored, lungs CTA bilaterally Abdomen:  Soft, nontender and nondistended. Normal bowel sounds. Extremities:  Without edema. Neurologic:  Alert and oriented,  grossly normal neurologically. Psych:  Cooperative. Normal mood and affect.  Intake/Output from previous day: 11/12 0701 - 11/13 0700 In: 2633.2 [P.O.:2440; I.V.:193.2] Out: 3850 [Urine:3850] Intake/Output this shift: No intake/output data recorded.  Lab Results:  Recent Labs  12/21/15 0452 12/22/15 0440 12/23/15 0449  WBC 5.5 5.3 5.1  HGB 9.9* 9.6* 9.8*  HCT 29.5* 29.0* 29.6*  PLT 223 231 256   BMET  Recent Labs  12/22/15 0440 12/22/15 1152 12/23/15 0449  NA 136 136 138  K 4.4 4.1 4.6  CL 105 105 110  CO2 26 27 22   GLUCOSE 101* 138* 104*  BUN 12 10 13   CREATININE 0.84 0.87 0.86  CALCIUM 10.3 10.5* 10.4*   LFT  Recent Labs  12/23/15 0449  PROT 6.1*  ALBUMIN 3.5  AST 25  ALT 20  ALKPHOS 66  BILITOT 0.3   PT/INR No results for input(s): LABPROT, INR in the last 72 hours.  Studies/Results: No results found.     Assessment / Plan:     #1 75 yo female with syncope , GI bleed in setting of dual blood thinners- hx CAD, Afib CHF, PVD. Stable - no active bleeding  EGD  normal; Colon - diverticulosis , and 70mm cecal poly which was removed  (path pending)- will be some risk of post polyp bleeding for next 2 weeks  if remains anticoagulated    Capsule endoscopy - completed 11/11- will be read today  Possible enteroscopy depending on Capsule findings    Principal Problem:   Syncope Active Problems:   Essential hypertension   PAF (paroxysmal atrial fibrillation) (HCC)   S/P placement of cardiac pacemaker, medtronic adapta 01/21/12   PVD (peripheral vascular disease), hx stents to bil SFAs 02/2010   CAD (coronary artery disease)   S/P CABG x 3 01/04/15   Febrile illness, acute   Paroxysmal atrial flutter (HCC)   Chronic diastolic heart failure (HCC)   Hypercalcemia   Gastrointestinal hemorrhage   Acute blood loss anemia   CKD (chronic kidney disease), stage II   Chronic anticoagulation   Benign neoplasm of ascending colon   Pain of toe of right foot     LOS: 6 days   Amy Esterwood  12/23/2015, 9:01 AM

## 2015-12-23 NOTE — Progress Notes (Signed)
[place] for IV heparin (while off xarelto) Indication: atrial fibrillation  Allergies  Allergen Reactions  . Brilinta [Ticagrelor] Shortness Of Breath  . Clonidine Derivatives Other (See Comments)    Lowers heart rate  . Atorvastatin Other (See Comments)    Possible cause of fatigue/malaise  . Crestor [Rosuvastatin Calcium] Other (See Comments)    Joint/muscle aches  . Spironolactone     Contraindicated with history of hyperkalemia  . Exforge [Amlodipine Besylate-Valsartan] Itching and Rash    Patient Measurements: Height: 5\' 4"  (162.6 cm) Weight: 135 lb 12.8 oz (61.6 kg) IBW/kg (Calculated) : 54.7 Heparin Dosing Weight: 62 kg (TBW)  Vital Signs: Temp: 98.3 F (36.8 C) (11/13 0554) Temp Source: Oral (11/13 0554) BP: 133/69 (11/13 0626) Pulse Rate: 81 (11/13 0554)  Labs:  Recent Labs  12/21/15 0452  12/22/15 0440 12/22/15 1152 12/22/15 1509 12/23/15 0449  HGB 9.9*  --  9.6*  --   --  9.8*  HCT 29.5*  --  29.0*  --   --  29.6*  PLT 223  --  231  --   --  256  HEPARINUNFRC  --   < > 0.33  --  0.35 0.29*  CREATININE 0.72  --  0.84 0.87  --   --   < > = values in this interval not displayed.  Estimated Creatinine Clearance: 48.2 mL/min (by C-G formula based on SCr of 0.87 mg/dL).   Medical History: Past Medical History:  Diagnosis Date  . ANXIETY 12/31/2009  . Aortic insufficiency    a. mod by echo 2017.  [place] CAD (coronary artery disease)    a. s/p CABGx3 and LAA clipping in 12/2014, NSTEMI 05/2015 s/p DES to native RCA and SVG-dRCA; occluded ramus-SVG was treated medically). c. neg nuc 10/2015 at [facility].  . Chronic diastolic CHF (congestive heart failure) ([place])   . Chronic fatigue   . CKD (chronic kidney disease), stage II   . DEPRESSION 12/31/2009  . [place] DISEASE, CERVICAL 12/31/2009  . [place] DISEASE, LUMBAR 12/31/2009  . DIVERTICULITIS, HX OF 12/31/2009  . Essential hypertension   . GLUCOSE INTOLERANCE 12/31/2009  .  Habitual alcohol use   . Hypercalcemia   . Hyperkalemia   . Hyperlipidemia 12/31/2009  . MENOPAUSE, EARLY 12/31/2009  . NSTEMI (non-ST elevated myocardial infarction) ([place]) 05/11/2015  . Orthostatic hypotension   . OSTEOARTHRITIS, HAND   . Pacemaker 01/21/2012   MDT Adapta dual chamber  . Paroxysmal atrial flutter ([place])   . Persistent atrial fibrillation ([place])   . Pre-diabetes   . PVD (peripheral vascular disease), hx stents to bil SFAs 02/2010   . Symptomatic sinus bradycardia 01/22/2012  . Syncope    a. 11/2015 ? due to medications.  . Tachy-brady syndrome ([place])    a. s/p MDT PPM 2013.  . Tricuspid regurgitation     Medications:  Prescriptions Prior to Admission  Medication Sig Dispense Refill Last Dose  . amiodarone (PACERONE) 100 MG tablet Take 1 tablet (100 mg total) by mouth daily. (Patient taking differently: Take 100 mg by mouth every morning. ) 30 tablet 11 12/16/2015 at Unknown time  . carvedilol (COREG) 6.25 MG tablet Take 1 tablet (6.25 mg total) by mouth 2 (two) times daily with a meal. 60 tablet 0 12/16/2015 at 0900  . cholecalciferol (VITAMIN D) 1000 UNITS tablet Take 1,000 Units by mouth at bedtime.    12/15/2015 at Unknown time  . citalopram (CELEXA) 20 MG tablet Take 0.5 tablets (10 mg  total) by mouth daily. (Patient taking differently: Take 10 mg by mouth every morning. )   12/16/2015 at Unknown time  . clopidogrel (PLAVIX) 75 MG tablet TAKE 1 TABLET (75 MG TOTAL) BY MOUTH DAILY. (Patient taking differently: Take 75 mg by mouth every morning. ) 90 tablet 1 12/16/2015 at 0900  . Coenzyme Q10 (COQ10 PO) Take 120 mg by mouth at bedtime.    12/15/2015 at Unknown time  . hydrochlorothiazide (HYDRODIURIL) 25 MG tablet Take 25 mg by mouth once.   12/16/2015 at 0900  . lisinopril (PRINIVIL,ZESTRIL) 30 MG tablet Take 1 tablet (30 mg total) by mouth daily. (Patient taking differently: Take 30 mg by mouth every morning. ) 30 tablet 0 12/16/2015 at Unknown time  . Multiple Vitamin  (MULTIVITAMIN) capsule Take 1 capsule by mouth 2 (two) times daily.    12/16/2015 at Unknown time  . nitroGLYCERIN (NITROSTAT) 0.4 MG SL tablet Place 1 tablet (0.4 mg total) under the tongue every 5 (five) minutes x 3 doses as needed for chest pain. 25 tablet 2 Past Week at Unknown time  . rivaroxaban (XARELTO) 15 MG TABS tablet Take 1 tablet (15 mg total) by mouth daily with supper. 30 tablet 5 12/16/2015 at 1800   Scheduled:  . amiodarone  100 mg Oral Daily  . bupivacaine (PF)  10-30 mL Infiltration Once  . carvedilol  3.125 mg Oral BID WC  . citalopram  10 mg Oral Daily  . Influenza vac split quadrivalent PF  0.5 mL Intramuscular Tomorrow-1000  . lidocaine  10-50 mL Intradermal Once  . lisinopril  20 mg Oral Daily  . pantoprazole  40 mg Oral Daily    Assessment: 75 y.o. female with past medical history of CAD status post CABG and stent placement in March 2017 maintained on Plavix and Xarelto, hypertension, paroxysmal A. fib, PVD, aortic insufficiency and tricuspid regurgitation and presented to the ER due to LOC. Hemoccult positive and patient reports melena, but EGD without findings. Plavix and Xarelto held in anticipation of colonoscopy on 11/11. Cardiology would like to initiate heparin given high CHADS2VASc score = 6   Baseline INR, aPTT, HL: pending (INR 1.41 on 10/26 - consistent with Xarelto use)  Prior anticoagulation: Xarelto (LD 11/6 at 1800), Plavix (LD 11/6 at 0900)  Significant events: 11/8: ordered 2 units PRBC; EGD on 11/8 with no signif findings 11/9: reported black stool this morning 11/11: Heparin off at 02:00 this AM for colonoscopy which showed diverticulosis. Ok per GI to resume heparin (no bolus) at 12:00 today. Underwent small bowel capsle study today.  Today, 12/23/2015  Heparin level slightly subtherapeutic at 0.29 at current rate of  850 units/hr  Cbc relatively stable  No bleeding documented  Goal of Therapy: Heparin level 0.3-0.7 units/ml Monitor  platelets by anticoagulation protocol: Yes  Plan:  Increase heparin drip to 900 units/hr   Recheck 8 hr heparin level after rate change  F/u plans for resuming Xarelto and Plavix after results of small bowel capsule study  Dia Sitter, PharmD, BCPS 12/23/2015 7:07 AM

## 2015-12-23 NOTE — Progress Notes (Signed)
Patient Name: [name] Date of Encounter: 12/23/2015  Primary Cardiologist: Dr. Delila Pereyra Problem List     Principal Problem:   Syncope Active Problems:   PAF (paroxysmal atrial fibrillation) (HCC)   S/P placement of cardiac pacemaker, medtronic adapta 01/21/12   Essential hypertension   PVD (peripheral vascular disease), hx stents to bil SFAs 02/2010   CAD (coronary artery disease)   S/P CABG x 3 01/04/15   Febrile illness, acute   Paroxysmal atrial flutter (HCC)   Chronic diastolic heart failure (HCC)   Hypercalcemia   Gastrointestinal hemorrhage   Acute blood loss anemia   CKD (chronic kidney disease), stage II   Chronic anticoagulation   Benign neoplasm of ascending colon   Pain of toe of right foot     Subjective   No chest pain and no SOB  Inpatient Medications    Scheduled Meds: . amiodarone  100 mg Oral Daily  . bupivacaine (PF)  10-30 mL Infiltration Once  . carvedilol  3.125 mg Oral BID WC  . citalopram  10 mg Oral Daily  . Influenza vac split quadrivalent PF  0.5 mL Intramuscular Tomorrow-1000  . lidocaine  10-50 mL Intradermal Once  . lisinopril  20 mg Oral Daily  . pantoprazole  40 mg Oral Daily   Continuous Infusions: . sodium chloride    . heparin 850 Units/hr (12/22/15 0800)   PRN Meds: acetaminophen **OR** acetaminophen, acetaminophen, diphenhydrAMINE, nitroGLYCERIN, ondansetron **OR** ondansetron (ZOFRAN) IV   Vital Signs    Vitals:   12/22/15 1757 12/22/15 2111 12/23/15 0554 12/23/15 0626  BP: 127/63 124/75 (!) 170/89 133/69  Pulse: 83 84 81   Resp: 16 16 15    Temp: 98.1 F (36.7 C) 98.3 F (36.8 C) 98.3 F (36.8 C)   TempSrc: Oral Oral Oral   SpO2: 99% 100% 95%   Weight:      Height:        Intake/Output Summary (Last 24 hours) at 12/23/15 0807 Last data filed at 12/23/15 0358  Gross per 24 hour  Intake          2609.72 ml  Output             3850 ml  Net         -1240.28 ml   Filed Weights   12/17/15  0248  Weight: 135 lb 12.8 oz (61.6 kg)    Physical Exam   GEN: Well nourished,  in no acute distress.  HEENT: normocephalic, sclera clear, mucus membranes moist.  Neck: Supple, no JVD Cardiac: RRR, no murmurs, rubs, or gallops. No clubbing, cyanosis, edema.  Radials/DP/PT 2+ and equal bilaterally.  Respiratory:  Respirations regular and unlabored, clear to auscultation bilaterally without rales, rhonchi or wheezes. GI: Abd -Soft, nontender, nondistended, BS + x 4. MS: no deformity or atrophy. Skin: warm and dry, brisk capillary refill, no obvious rash Neuro:  Alert and oriented X 3 MAE, follows commands Psych: answers questions appropriately,Normal and pleasant affect.   Labs    CBC  Recent Labs  12/21/15 0452 12/22/15 0440 12/23/15 0449  WBC 5.5 5.3 5.1  NEUTROABS 2.7 2.3  --   HGB 9.9* 9.6* 9.8*  HCT 29.5* 29.0* 29.6*  MCV 86.0 85.8 85.8  PLT 223 231 [id number]   Basic Metabolic Panel  Recent Labs  12/22/15 0440 12/22/15 1152 12/23/15 0449  NA 136 136  --   K 4.4 4.1  --   CL 105 105  --   CO2  26 27  --   GLUCOSE 101* 138*  --   BUN 12 10  --   CREATININE 0.84 0.87  --   CALCIUM 10.3 10.5*  --   MG 1.9  --  2.0  PHOS 4.0  --  3.7   Liver Function Tests  Recent Labs  12/22/15 0440 12/22/15 1152  AST 25 25  ALT 18 18  ALKPHOS 69 70  BILITOT 0.5 0.6  PROT 5.9* 6.1*  ALBUMIN 3.6 3.5   No results for input(s): LIPASE, AMYLASE in the last 72 hours. Cardiac Enzymes No results for input(s): CKTOTAL, CKMB, CKMBINDEX, TROPONINI in the last 72 hours. BNP Invalid input(s): POCBNP D-Dimer No results for input(s): DDIMER in the last 72 hours. Hemoglobin A1C No results for input(s): HGBA1C in the last 72 hours. Fasting Lipid Panel No results for input(s): CHOL, HDL, LDLCALC, TRIG, CHOLHDL, LDLDIRECT in the last 72 hours. Thyroid Function Tests No results for input(s): TSH, T4TOTAL, T3FREE, THYROIDAB in the last 72 hours.  Invalid input(s):  FREET3  Telemetry    A pacing - Personally Reviewed  ECG    12/18/15   A paced and V paced and sensing- Personally Reviewed  Radiology    No results found.  Cardiac Studies   Echocardiogram: 12/04/2015 Study Conclusions  - Left ventricle: The cavity size was normal. Systolic function was normal. The estimated ejection fraction was in the range of 55% to 60%. Wall motion was normal; there were no regional wall motion abnormalities. There was an increased relative contribution of atrial contraction to ventricular filling. Doppler parameters are consistent with abnormal left ventricular relaxation (grade 1 diastolic dysfunction). - Aortic valve: Trileaflet; mildly thickened, mildly calcified leaflets. There was moderate regurgitation.  Patient Profile        38F w/ PMH of CAD (s/p CABGx3 and LAA clipping in 12/2014, NSTEMI 05/2015 s/p DES to native RCA and SVG-dRCA; occluded ramus-SVG was treated medically), paroxysmal atrial fib, tachybrady syndrome (s/p PPM 2013), CKD stage II, orthostatic hypotension, PAD (s/p prior LE stenting), alcohol abuse, HTN, HLD, hyperkalemia, hypercalcemia, chronic diastolic CHF, moderate AI, syncope, &multiple medication intolerances who presented to [facility] on 12/16/2015 with a GIB.  Assessment & Plan    1. GIB/ABL anemia  - Xarelto and Plavix on hold per primary team. Transfused 2 units pRBC's on 11/8. EGD performed on 11/8 and unremarkable. Colonoscopy on 11/11. Diverticulosis of entire colon, 1 polyp removed, no explanation for acute anemia.  Now with small bowel capsule depending on results may go back to plavix and Xarelto. Curently on IV heparin. - GI following  2. Syncope  - suspect related to blood pressure drop in the setting of volume loss and recent orthostasis. No repeat episodes since being admitted. - telemetry showing A-paced rhythm and occ V paced.  3. Paroxysmal atrial fib/flutter  - This patients CHA2DS2-VASc Score  and unadjusted Ischemic Stroke Rate (% per year) is equal to 9.7 % stroke rate/year from a score of 6 (HTN, Vascular, Female, Age (2)), DM). Xarelto held in preparation for colonoscopy. Receiving Heparin bridge. Resume xarelto when bleeding improves and ok with GI - Continue Amiodarone 100mg  daily and Coreg 3.125mg  BID. -pt is anxious about continuing Xarelto- she will discuss with Dr. Sallyanne Kuster but for now plan to resume if ok with GI  4. CAD  - s/p CABG in 12/2014, PCI 05/2015 - she is now >6 months out from recent stenting. The original plan was to continue until April 2018 but with significant GIB,  will hold Plavix for now. Resume plavix when ok with GI.  5. Stage 2 CKD - creatinine improved   6. Hypertension - BP was elevated; with increase of lisinopril to 20 mg daily BP now controlled  Signed, Cecilie Kicks, NP  12/23/2015, 8:07 AM  [place] Pager [phone]  After 5 or weekends [phone]  I have seen and examined the patient along with Cecilie Kicks, NP .  I have reviewed the chart, notes and new data.  I agree with NP's note.  Key new complaints: feeling much better Key examination changes: normal rhythm (atrial paced, ventricular sensed) Key new findings / data: creat normal, Hgb stable.   PLAN:  Stop clopidogrel as > 6 months since PCI. Stop Xarelto. For AF stroke prevention, prefer Eliquis 5 mg BID, with probably lower GI bleeding risk profile.  OK to hold off anticoagulant for 2 weeks to allow healing of unidentified bleeding site and polypectomy site. Will arrange for Cardiology follow up in 2 weeks.  Sanda Klein, MD, [place] [phone] 12/23/2015, 1:06 PM

## 2015-12-23 NOTE — Progress Notes (Signed)
Patient ID: [name], female   DOB: [birth year], 75 y.o.   MRN: [id number]  Capsule Endoscopy is negative. No findings to explain bleeding   GI bleeding in this pt on dual anticoagulation  with Xarelto and Plavix  Is of unclear etiology.  She does have diverticulosis, and had a 9 mm cecal polyp removed at time of Colonoscopy .  For now recommend close follow up with serial hgb's . From GI standpoint would prefer she be placed back on  only one of her blood thinners over the next 2 weeks as she is at some risk for post polypectomy bleeding during that time frame  Would defer to cardiology and ask that they decide if this is acceptable.

## 2015-12-23 NOTE — Progress Notes (Signed)
Patient discharged home with daughter, discharge instructions given and explained to patient, she verbalized understanding, denies any pain/distress, accompanied home by daughter, transported to the car by staff via wheelchair. No wound noted, skin intact.

## 2015-12-24 ENCOUNTER — Other Ambulatory Visit: Payer: Self-pay | Admitting: Cardiovascular Disease

## 2015-12-24 ENCOUNTER — Other Ambulatory Visit: Payer: Self-pay | Admitting: Gastroenterology

## 2015-12-24 DIAGNOSIS — I739 Peripheral vascular disease, unspecified: Secondary | ICD-10-CM

## 2015-12-27 ENCOUNTER — Telehealth: Payer: Self-pay | Admitting: Cardiovascular Disease

## 2015-12-27 NOTE — Telephone Encounter (Signed)
Closed encounter °

## 2015-12-30 ENCOUNTER — Ambulatory Visit (HOSPITAL_COMMUNITY)
Admission: RE | Admit: 2015-12-30 | Discharge: 2015-12-30 | Disposition: A | Discharge status: Home / Self Care | Payer: Commercial Managed Care - HMO | Source: Ambulatory Visit | Attending: Cardiology | Admitting: Cardiology

## 2015-12-30 DIAGNOSIS — R9439 Abnormal result of other cardiovascular function study: Secondary | ICD-10-CM | POA: Insufficient documentation

## 2015-12-30 DIAGNOSIS — I743 Embolism and thrombosis of arteries of the lower extremities: Secondary | ICD-10-CM | POA: Insufficient documentation

## 2015-12-30 DIAGNOSIS — I739 Peripheral vascular disease, unspecified: Secondary | ICD-10-CM | POA: Diagnosis not present

## 2015-12-30 DIAGNOSIS — Z95820 Peripheral vascular angioplasty status with implants and grafts: Secondary | ICD-10-CM | POA: Insufficient documentation

## 2016-01-09 ENCOUNTER — Ambulatory Visit (INDEPENDENT_AMBULATORY_CARE_PROVIDER_SITE_OTHER): Payer: Commercial Managed Care - HMO | Admitting: Cardiovascular Disease

## 2016-01-09 ENCOUNTER — Encounter: Payer: Self-pay | Admitting: Cardiovascular Disease

## 2016-01-09 VITALS — BP 170/80 | HR 80 | Ht 64.0 in | Wt 141.0 lb

## 2016-01-09 DIAGNOSIS — I495 Sick sinus syndrome: Secondary | ICD-10-CM | POA: Diagnosis not present

## 2016-01-09 DIAGNOSIS — I25810 Atherosclerosis of coronary artery bypass graft(s) without angina pectoris: Secondary | ICD-10-CM | POA: Diagnosis not present

## 2016-01-09 DIAGNOSIS — I10 Essential (primary) hypertension: Secondary | ICD-10-CM

## 2016-01-09 DIAGNOSIS — Z950 Presence of cardiac pacemaker: Secondary | ICD-10-CM

## 2016-01-09 DIAGNOSIS — E7800 Pure hypercholesterolemia, unspecified: Secondary | ICD-10-CM

## 2016-01-09 DIAGNOSIS — I5032 Chronic diastolic (congestive) heart failure: Secondary | ICD-10-CM

## 2016-01-09 DIAGNOSIS — I739 Peripheral vascular disease, unspecified: Secondary | ICD-10-CM

## 2016-01-09 DIAGNOSIS — I480 Paroxysmal atrial fibrillation: Secondary | ICD-10-CM

## 2016-01-09 MED ORDER — LISINOPRIL 20 MG PO TABS: 30.0000 mg | ORAL_TABLET | Freq: Every day | ORAL | 11 refills | Status: DC

## 2016-01-09 NOTE — Progress Notes (Signed)
Patient ID: [name], female   DOB: 04/06/1940, 75 y.o.   MRN: [id number]     Cardiology Office Note    Date:  01/09/2016   ID:  [name], DOB August 20, 1940, MRN [id number]  PCP:  [name], MD  Cardiologist:   Sanda Klein, MD   Chief Complaint  Patient presents with  . Follow-up    History of Present Illness:  [name] is a 75 y.o. female with coronary artery disease and recent bypass surgery, Symptomatic paroxysmal atrial fibrillation.  She was recently hospitalized with syncope and anemia, presumably due to GI bleeding. Workup with EGD, colonoscopy and capsule enteroscopy failed to reveal the source of bleeding. She restarted anticoagulation with Eliquis 4 days ago.  Blood pressure control has been highly erratic, with blood pressures as low as 124/67 and as high as 180/85. Her weight has been relatively steady at 133-136 pounds. She had diastolic heart failure when she weighed over 140 pounds. She had orthostatic hypotension when she weighed 130 pounds. Probably "dry weight" is around 135 pounds.   Saw Dr. Chalmers Cater, who is relatively confident that she has primary hyperparathyroidism. This may be contributing to her hypertension.  She is currently tolerating a low dose of amiodarone. She had neuropathic numbness and discomfort in her hands and feet on higher doses. There has been no symptomatic atrial fibrillation.  She has not restarted statin, stopped due to musculoskeletal complaints.  Recent pacemaker interrogation today shows normal device function. Her Medtronic Adapta dual-chamber device was implanted in 2013 and has another 8 years of estimated longevity. Lead parameters are all within normal range. She has 99.8% atrial pacing and only 0.2% ventricular pacing. She has not had any atrial fibrillation since starting amiodarone.  She has normal left ventricular systolic function. She does have evidence of grade 2 diastolic dysfunction on previous echo. At the  time of her bypass surgery she received an Atricure left atrial appendage clip. She is also on clopidogrel following her drug-eluting stents in April 2017. The pacemaker was implanted in 2013 for symptomatic sinus bradycardia. She also has a history of peripheral arterial disease and received bilateral superficial femoral artery stents about 3 years ago. She has never smoked but drinks daily. She has treated hypertension.  After undergoing bypass surgery in November 2016 she returned with non-ST segment elevation myocardial infarction in April 2017 and required placement of stents both in the native right coronary artery (3.020 mm Synergy DES) and in the saphenous vein graft to the distal right coronary artery (2.7538 mm Synergy DES) due to early graft dysfunction. The saphenous vein graft to the ramus intermedius was also occluded, but this vessel was left for medical therapy.    Past Medical History:  Diagnosis Date  . ANXIETY 12/31/2009  . Aortic insufficiency    a. mod by echo 2017.  [place] CAD (coronary artery disease)    a. s/p CABGx3 and LAA clipping in 12/2014, NSTEMI 05/2015 s/p DES to native RCA and SVG-dRCA; occluded ramus-SVG was treated medically). c. neg nuc 10/2015 at [facility].  . Chronic diastolic CHF (congestive heart failure) ([place])   . Chronic fatigue   . CKD (chronic kidney disease), stage II   . DEPRESSION 12/31/2009  . [place] DISEASE, CERVICAL 12/31/2009  . [place] DISEASE, LUMBAR 12/31/2009  . DIVERTICULITIS, HX OF 12/31/2009  . Essential hypertension   . GLUCOSE INTOLERANCE 12/31/2009  . Habitual alcohol use   . Hypercalcemia   . Hyperkalemia   . Hyperlipidemia 12/31/2009  .  MENOPAUSE, EARLY 12/31/2009  . NSTEMI (non-ST elevated myocardial infarction) ([place]) 05/11/2015  . Orthostatic hypotension   . OSTEOARTHRITIS, HAND   . Pacemaker 01/21/2012   MDT Adapta dual chamber  . Paroxysmal atrial flutter ([place])   . Persistent atrial fibrillation ([place])   . Pre-diabetes   . PVD  (peripheral vascular disease), hx stents to bil SFAs 02/2010   . Symptomatic sinus bradycardia 01/22/2012  . Syncope    a. 11/2015 ? due to medications.  . Tachy-brady syndrome ([place])    a. s/p MDT PPM 2013.  . Tricuspid regurgitation     Past Surgical History:  Procedure Laterality Date  . AUGMENTATION MAMMAPLASTY  1983  . CARDIAC CATHETERIZATION N/A 01/01/2015   Procedure: Left Heart Cath and Coronary Angiography;  Surgeon: Jettie Booze, MD;  Location: [place] CV LAB;  Service: Cardiovascular;  Laterality: N/A;  . CARDIAC CATHETERIZATION N/A 05/12/2015   Procedure: Left Heart Cath and Coronary Angiography;  Surgeon: Troy Sine, MD;  Location: [place] CV LAB;  Service: Cardiovascular;  Laterality: N/A;  . CARDIAC CATHETERIZATION N/A 05/12/2015   Procedure: Coronary Stent Intervention;  Surgeon: Troy Sine, MD;  Location: [place] CV LAB;  Service: Cardiovascular;  Laterality: N/A;  . CARDIOVERSION N/A 02/01/2015   Procedure: CARDIOVERSION;  Surgeon: Lelon Perla, MD;  Location: [facility] ENDOSCOPY;  Service: Cardiovascular;  Laterality: N/A;  . CARDIOVERSION N/A 08/30/2015   Procedure: CARDIOVERSION;  Surgeon: Sanda Klein, MD;  Location: [place] ENDOSCOPY;  Service: Cardiovascular;  Laterality: N/A;  . CARPAL TUNNEL RELEASE  2008   "right hand/thumb; carpal tunnel repair; got rid of arthritis" (01/21/2012)  . CLIPPING OF ATRIAL APPENDAGE N/A 01/04/2015   Procedure: CLIPPING OF ATRIAL APPENDAGE;  Surgeon: Grace Isaac, MD;  Location: [place];  Service: Open Heart Surgery;  Laterality: N/A;  . COLONOSCOPY N/A 12/21/2015   Procedure: COLONOSCOPY;  Surgeon: Milus Banister, MD;  Location: [facility] ENDOSCOPY;  Service: Endoscopy;  Laterality: N/A;  . CORONARY ARTERY BYPASS GRAFT N/A 01/04/2015   Procedure: CORONARY ARTERY BYPASS GRAFTING (CABG) x 3 using left internal mammory artery and greater saphenous vein right leg harvested endoscopically.;  Surgeon: Grace Isaac, MD;   LIMA-LAD, SVG-RI, SVG-PDA  . ESOPHAGOGASTRODUODENOSCOPY (EGD) WITH PROPOFOL N/A 12/18/2015   Procedure: ESOPHAGOGASTRODUODENOSCOPY (EGD) WITH PROPOFOL;  Surgeon: Milus Banister, MD;  Location: [facility] ENDOSCOPY;  Service: Endoscopy;  Laterality: N/A;  . FACELIFT, LOWER 2/3  1995   "mini" (01/21/2012)  . GIVENS CAPSULE STUDY N/A 12/21/2015   Procedure: GIVENS CAPSULE STUDY;  Surgeon: Milus Banister, MD;  Location: [facility] ENDOSCOPY;  Service: Endoscopy;  Laterality: N/A;  . Lower Arterial Examination  10/28/2011   R. SFA stent mild-moderate mixed density plaque with elevated velocities consistent with 50% diameter reduction. L. SFA stent moderate mixed denisty plaque at mid to distal level consistent with 50-69% diameter reduction.  . OOPHORECTOMY  ~1979  . PARTIAL COLECTOMY  2010  . PERIPHERAL ARTERIAL STENT GRAFT  2012; 2012   "LLE; RLE" (01/21/2012)  . PERMANENT PACEMAKER INSERTION N/A 01/21/2012   Medtronic Adapta L implanted by Dr Sallyanne Kuster for tachy/brady syndrome  . POSTERIOR CERVICAL LAMINECTOMY  1985  . TEE WITHOUT CARDIOVERSION N/A 01/04/2015   Procedure: TRANSESOPHAGEAL ECHOCARDIOGRAM (TEE);  Surgeon: Grace Isaac, MD;  Location: [place];  Service: Open Heart Surgery;  Laterality: N/A;  . TEE WITHOUT CARDIOVERSION N/A 02/01/2015   Procedure: TRANSESOPHAGEAL ECHOCARDIOGRAM (TEE);  Surgeon: Lelon Perla, MD;  Location: [place];  Service: Cardiovascular;  Laterality: N/A;  . TEE WITHOUT CARDIOVERSION N/A 08/30/2015   Procedure: TRANSESOPHAGEAL ECHOCARDIOGRAM (TEE);  Surgeon: Sanda Klein, MD;  Location: [facility] ENDOSCOPY;  Service: Cardiovascular;  Laterality: N/A;  . VAGINAL HYSTERECTOMY  1975    Current Medications: Outpatient Medications Prior to Visit  Medication Sig Dispense Refill  . amiodarone (PACERONE) 100 MG tablet Take 1 tablet (100 mg total) by mouth daily. (Patient taking differently: Take 100 mg by mouth 2 (two) times daily. ) 30 tablet 11  . apixaban (ELIQUIS) 5 MG  TABS tablet Take 1 tablet (5 mg total) by mouth 2 (two) times daily. 60 tablet 0  . carvedilol (COREG) 3.125 MG tablet Take 1 tablet (3.125 mg total) by mouth 2 (two) times daily with a meal. 60 tablet 0  . cholecalciferol (VITAMIN D) 1000 UNITS tablet Take 1,000 Units by mouth at bedtime.     . citalopram (CELEXA) 20 MG tablet Take 0.5 tablets (10 mg total) by mouth daily. (Patient taking differently: Take 10 mg by mouth every morning. )    . Coenzyme Q10 (COQ10 PO) Take 120 mg by mouth at bedtime.     . Multiple Vitamin (MULTIVITAMIN) capsule Take 1 capsule by mouth 2 (two) times daily.     . nitroGLYCERIN (NITROSTAT) 0.4 MG SL tablet Place 1 tablet (0.4 mg total) under the tongue every 5 (five) minutes x 3 doses as needed for chest pain. 25 tablet 2  . lisinopril (PRINIVIL,ZESTRIL) 20 MG tablet Take 1 tablet (20 mg total) by mouth daily. 30 tablet 0   No facility-administered medications prior to visit.      Allergies:   Brilinta [ticagrelor]; Clonidine derivatives; Atorvastatin; Crestor [rosuvastatin calcium]; Exforge [amlodipine besylate-valsartan]; and Spironolactone   Social History   Social History  . Marital status: Widowed    Spouse name: N/A  . Number of children: 3  . Years of education: N/A   Occupational History  . Retired Chemical engineer Retired   Social History Main Topics  . Smoking status: Former Smoker    Packs/day: 0.75    Years: 10.00    Types: Cigarettes  . Smokeless tobacco: Never Used     Comment: 01/21/2012 "quit smoking ~ 2002"  . Alcohol use Yes     Comment: 01/21/2012 "3 times/wk I have a couple mixed drinks"; 06/28/2015 "nothing in the last 3 weeks cause I haven't felt good", reduced recently  . Drug use: No  . Sexual activity: Not Currently   Other Topics Concern  . None   Social History Narrative   Lives in [place].  Retired.     Family History:  The patient's family history includes CVA in her brother; Heart attack in her brother; Heart  disease in her brother, father, and mother; Hypertension in her brother and mother; Stroke in her father and mother.   ROS:   Please see the history of present illness.    ROS All other systems reviewed and are negative.   PHYSICAL EXAM:   VS:  BP (!) 170/80 (BP Location: Right Arm, Patient Position: Sitting, Cuff Size: Normal)   Pulse 80   Ht 5\' 4"  (1.626 m)   Wt 141 lb (64 kg)   SpO2 98%   BMI 24.20 kg/m     GEN: Well nourished, well developed, in no acute distress  HEENT: normal  Neck: no JVD, carotid bruits, or masses Cardiac: RRR; no murmurs, rubs, or gallops,no edema  Respiratory:  clear to auscultation bilaterally, normal work of breathing GI: soft, nontender,  nondistended, + BS MS: no deformity or atrophy  Skin: warm and dry, no rash Neuro:  Alert and Oriented x 3, Strength and sensation are intact Psych: euthymic mood, full affect  Wt Readings from Last 3 Encounters:  01/09/16 141 lb (64 kg)  12/17/15 135 lb 12.8 oz (61.6 kg)  12/08/15 126 lb 1.7 oz (57.2 kg)      Studies/Labs Reviewed:   EKG:  EKG is not ordered today.    Recent Labs: 09/16/2015: TSH 5.10 12/02/2015: B Natriuretic Peptide 499.0 12/23/2015: ALT 20; BUN 13; Creatinine, Ser 0.86; Hemoglobin 9.8; Magnesium 2.0; Platelets 256; Potassium 4.6; Sodium 138   Lipid Panel    Component Value Date/Time   CHOL 158 05/12/2015 0324   TRIG 114 05/12/2015 0324   HDL 64 05/12/2015 0324   CHOLHDL 2.5 05/12/2015 0324   VLDL 23 05/12/2015 0324   LDLCALC 71 05/12/2015 0324   10/04/2015  BUN 38 creatinine 1.46, Ca 11.6, K 5.8.  Total cholesterol 244, triglycerides 195, HDL 63, LDL 142 10/15/2015  creatinine 1.05, BUN 23, K 6.3, Ca 11.6, ionized calcium  6.0. Intact PTH 83. TSH of 4.88,  free T4 of 1.13.  ASSESSMENT:    1. Chronic diastolic heart failure ([place])   2. Coronary artery disease involving coronary bypass graft of native heart without angina pectoris   3. Pure hypercholesterolemia   4. Sinus node  dysfunction (H[facility])   5. PAF (paroxysmal atrial fibrillation) ([place])   6. Pacemaker   7. PVD (peripheral vascular disease), hx stents to bil SFAs 02/2010   8. Essential hypertension   9. Hypercalcemia      PLAN:  In order of problems listed above:  1. CHF: Seems to be "dry" at 130 pounds and was in heart failure at 145 pounds. We'll continue to shoot for a dry weight of around 135 pounds. Preserved left ventricular systolic function. 2. CAD: Currently without symptoms of angina pectoris. Unfortunate she had early failure over 2 saphenous vein grafts, one of which was treated with a drug-eluting stent. Her mammary artery bypass was widely patent. The difference in blood pressure between her upper extremity suggests she may have left subclavian stenosis, but there was no evidence of this by ultrasonography. Continue antiplatelet therapy with recently placed drug-eluting stents (clopidogrel at least through April 2018). 3. HLP: Not sure whether her weakness was truly statin related. Her aches and pains and weakness may have been due to hypercalcemia. Current lipid profile shows unacceptably elevated levels of LDL cholesterol. Plan to restart a statin once the rest of her metabolic problems are compensated. 4. SSS: Her pacemaker shows good distribution of heart rate histogram.  5. AFib: Very low burden of arrhythmia. She has an atrial appendage clip and is on Eliquis also. She had neuropathic symptoms that might be related to amiodarone, improved at a dose 100 mg daily 6. PPM: Normal device function. Remote download every 3 months and yearly office visits. 7. PAD: She describes fatigue rather than intermittent claudication. Bilateral lower extremity Doppler study showed normal ABIs and waveforms in both extremities.  8. HTN: Erratic blood pressure control. Increase ACE inhibitor dose slightly. Potassium normalized after discontinuation of spironolactone. She was reluctant to go all the way to 40 mg daily.  Instructed to have her blood pressure checked in the right upper extremity only. 9. Hypercalcemia: Labs suggest she has primary hyperparathyroidism. The hypercalcemia may explain some of her fatigue, maybe also her erratic blood pressure. Seeing Dr. Chalmers Cater.    Medication Adjustments/Labs  and Tests Ordered: Current medicines are reviewed at length with the patient today.  Concerns regarding medicines are outlined above.  Medication changes, Labs and Tests ordered today are listed in the Patient Instructions below. Patient Instructions  Dr Sallyanne Kuster has recommended making the following medication changes: 1. INCREASE Lisinopril to 30 mg daily  Your physician has requested that you regularly monitor and record your blood pressure readings at home. Please use the same machine at the same time of day to check your readings and record them to bring to your follow-up visit. Please report your blood pressure readings either via mychart or by calling the office to speak with a nurse after the new year.  Dr Sallyanne Kuster recommends that you schedule a follow-up appointment in 3 months.  If you need a refill on your cardiac medications before your next appointment, please call your pharmacy.    Signed, Sanda Klein, MD  01/09/2016 3:36 PM    [address], [place], [place]  [address] Phone: [phone]; Fax: [phone]

## 2016-01-09 NOTE — Patient Instructions (Signed)
Dr Sallyanne Kuster has recommended making the following medication changes: 1. INCREASE Lisinopril to 30 mg daily  Your physician has requested that you regularly monitor and record your blood pressure readings at home. Please use the same machine at the same time of day to check your readings and record them to bring to your follow-up visit. Please report your blood pressure readings either via mychart or by calling the office to speak with a nurse after the new year.  Dr Sallyanne Kuster recommends that you schedule a follow-up appointment in 3 months.  If you need a refill on your cardiac medications before your next appointment, please call your pharmacy.

## 2016-01-10 DIAGNOSIS — Z950 Presence of cardiac pacemaker: Secondary | ICD-10-CM | POA: Insufficient documentation

## 2016-01-13 ENCOUNTER — Telehealth: Payer: Self-pay | Admitting: Cardiovascular Disease

## 2016-01-13 NOTE — Telephone Encounter (Signed)
Pt advised on recommendations. Aware to call if she has further needs.

## 2016-01-13 NOTE — Telephone Encounter (Signed)
Called and spoke to patient. Notes lisinopril dose was increased recently. She called concerned about BP readings. Had readings of 117/58, 120/55, 170/35, 118/50. She was particularly concerned about the 170/35 reading, as well as the low diastolic numbers.  I advised her that I would defer to Dr. Sallyanne Kuster - the aberrant reading could have been due to machine error.  Pt does not report any symptoms. I have advised her to continue the lisinopril & other meds as currently dosed, unless o/w instructed. She voiced understanding and thanks.

## 2016-01-13 NOTE — Telephone Encounter (Signed)
Agree, except for that one reading, other readings are in good range. Continue same meds.

## 2016-01-13 NOTE — Telephone Encounter (Signed)
Please call,pt says her blood pressure is low. Today it was 118/50 and last night the bottom number was 35. She have not taken her medicine,because it is so long.Please call asap.

## 2016-01-20 ENCOUNTER — Encounter: Payer: Commercial Managed Care - HMO | Admitting: Cardiovascular Disease

## 2016-01-21 ENCOUNTER — Other Ambulatory Visit: Payer: Self-pay | Admitting: Cardiovascular Disease

## 2016-01-21 NOTE — Telephone Encounter (Signed)
REFILL 

## 2016-02-04 ENCOUNTER — Other Ambulatory Visit: Payer: Self-pay | Admitting: Cardiovascular Disease

## 2016-02-04 ENCOUNTER — Telehealth: Payer: Self-pay | Admitting: Cardiovascular Disease

## 2016-02-04 MED ORDER — APIXABAN 5 MG PO TABS: 5.0000 mg | ORAL_TABLET | Freq: Two times a day (BID) | ORAL | 5 refills | Status: DC

## 2016-02-04 NOTE — Telephone Encounter (Signed)
Informed pt we are not currently stocked w samples of med. She voiced understanding and a request for refill to be sent to [facility]. I have sent this and patient is aware.

## 2016-02-04 NOTE — Telephone Encounter (Signed)
New message      Patient calling the office for samples of medication:   1.  What medication and dosage are you requesting samples for?  eliquis 5mg   2.  Are you currently out of this medication? Out of medication.  Pt is leaving to go out of town in the morning

## 2016-02-18 ENCOUNTER — Ambulatory Visit (INDEPENDENT_AMBULATORY_CARE_PROVIDER_SITE_OTHER): Payer: Medicare HMO

## 2016-02-18 ENCOUNTER — Ambulatory Visit (INDEPENDENT_AMBULATORY_CARE_PROVIDER_SITE_OTHER): Payer: Medicare HMO | Admitting: Physician Assistant

## 2016-02-18 VITALS — BP 116/76 | HR 98 | Temp 98.5°F | Resp 16 | Ht 65.0 in | Wt 143.0 lb

## 2016-02-18 DIAGNOSIS — R05 Cough: Secondary | ICD-10-CM

## 2016-02-18 DIAGNOSIS — J209 Acute bronchitis, unspecified: Secondary | ICD-10-CM | POA: Diagnosis not present

## 2016-02-18 DIAGNOSIS — R0981 Nasal congestion: Secondary | ICD-10-CM | POA: Diagnosis not present

## 2016-02-18 DIAGNOSIS — R059 Cough, unspecified: Secondary | ICD-10-CM

## 2016-02-18 MED ORDER — FLUTICASONE PROPIONATE 50 MCG/ACT NA SUSP: 2.0000 | Freq: Every day | NASAL | 6 refills | Status: DC

## 2016-02-18 MED ORDER — DOXYCYCLINE HYCLATE 100 MG PO TABS
100.0000 mg | ORAL_TABLET | Freq: Two times a day (BID) | ORAL | 0 refills | Status: DC
Start: 2016-02-18 — End: 2016-05-13

## 2016-02-18 MED ORDER — BENZONATATE 100 MG PO CAPS: 100.0000 mg | ORAL_CAPSULE | Freq: Three times a day (TID) | ORAL | 0 refills | Status: DC | PRN

## 2016-02-18 NOTE — Patient Instructions (Signed)
Flonase: 2 sprays each nostril at night before bed. You can also use this in the morning as well if it is helping you.  Warm tea with honey, warm salt water gargles. Please drink plenty of fluids, at least 2 liters of water a day.

## 2016-02-18 NOTE — Progress Notes (Signed)
[name]  MRN: [id number] DOB: 01-21-1941  PCP: [name], MD  Subjective:  Pt is a 76 year old female PMH coronary artery sclerosis, atrial fibrillation, PVD, CAD, NSTEMI, HTN, HLD, CKD, GI bleed, urinary incontinence who presents to clinic for sough x two weeks. She has had "a small cough" for longer than this, but a "deeper, croupy" cough has been present for about two weeks now. Is present all day.  Night cough is worse. Cough keeps her up at night. She is now having chest wall pain because she is coughing so much.  Taken Mucinex D. Not helping much.  Denies nasal drainage, fever, chills, chest pain, SOB, wheezing, nausea, vomiting, palpitations.  No asthma.   Review of Systems  Constitutional: Negative for chills, diaphoresis, fatigue and fever.  HENT: Negative for congestion, postnasal drip, rhinorrhea, sinus pressure, sneezing and sore throat.   Respiratory: Positive for cough. Negative for chest tightness, shortness of breath and wheezing.   Cardiovascular: Positive for chest pain. Negative for palpitations.  Gastrointestinal: Negative for abdominal pain, diarrhea, nausea and vomiting.  Neurological: Negative for weakness, light-headedness and headaches.  Psychiatric/Behavioral: Positive for sleep disturbance.    Patient Active Problem List   Diagnosis Date Noted  . Pacemaker 01/10/2016  . Heme positive stool   . Benign neoplasm of ascending colon   . Pain of toe of right foot   . Chronic anticoagulation   . Gastrointestinal hemorrhage 12/17/2015  . Acute blood loss anemia 12/17/2015  . CKD (chronic kidney disease), stage II   . Syncope 12/05/2015  . Hypotension 12/05/2015  . Hyperkalemia 12/04/2015  . H/O amiodarone therapy 12/04/2015  . Headache 12/04/2015  . Chronic diastolic heart failure ([place]) 10/19/2015  . Hypercalcemia 10/19/2015  . Atypical atrial flutter ([place])   . Paroxysmal atrial flutter ([place]) 08/23/2015  . Febrile illness, acute 06/27/2015    . Chronic fatigue 06/27/2015  . Stented coronary artery   . NSTEMI (non-ST elevated myocardial infarction) ([place]) 05/11/2015  . S/P CABG x 3 01/04/15 01/10/2015  . CAD (coronary artery disease) 01/04/2015  . Symptomatic sinus bradycardia 01/22/2012  . PAF (paroxysmal atrial fibrillation) ([place]) 01/22/2012  . Sinus node dysfunction ([place]) 01/22/2012  . S/P placement of cardiac pacemaker, medtronic adapta 01/21/12 01/22/2012  . PVD (peripheral vascular disease), hx stents to bil SFAs 02/2010 01/22/2012  . GLUCOSE INTOLERANCE 12/31/2009  . Hyperlipidemia 12/31/2009  . Anxiety 12/31/2009  . DEPRESSION 12/31/2009  . Essential hypertension 12/31/2009  . Coronary atherosclerosis 12/31/2009  . MENOPAUSE, EARLY 12/31/2009  . OSTEOARTHRITIS, HAND 12/31/2009  . [place] DISEASE, CERVICAL 12/31/2009  . [place] DISEASE, LUMBAR 12/31/2009  . UNSPECIFIED URINARY INCONTINENCE 12/31/2009  . DIVERTICULITIS, HX OF 12/31/2009    Current Outpatient Prescriptions on File Prior to Visit  Medication Sig Dispense Refill  . amiodarone (PACERONE) 100 MG tablet Take 1 tablet (100 mg total) by mouth daily. (Patient taking differently: Take 100 mg by mouth 2 (two) times daily. ) 30 tablet 11  . apixaban (ELIQUIS) 5 MG TABS tablet Take 1 tablet (5 mg total) by mouth 2 (two) times daily. 60 tablet 5  . carvedilol (COREG) 3.125 MG tablet TAKE 1 TABLET TWICE DAILY WITH A MEAL. 60 tablet 2  . cholecalciferol (VITAMIN D) 1000 UNITS tablet Take 1,000 Units by mouth at bedtime.     . citalopram (CELEXA) 20 MG tablet Take 0.5 tablets (10 mg total) by mouth daily. (Patient taking differently: Take 10 mg by mouth every morning. )    .  Coenzyme Q10 (COQ10 PO) Take 120 mg by mouth at bedtime.     Marland Kitchen lisinopril (PRINIVIL,ZESTRIL) 20 MG tablet Take 1.5 tablets (30 mg total) by mouth daily. 45 tablet 11  . Multiple Vitamin (MULTIVITAMIN) capsule Take 1 capsule by mouth 2 (two) times daily.     . nitroGLYCERIN (NITROSTAT) 0.4 MG SL tablet  Place 1 tablet (0.4 mg total) under the tongue every 5 (five) minutes x 3 doses as needed for chest pain. 25 tablet 2   No current facility-administered medications on file prior to visit.     Allergies  Allergen Reactions  . Brilinta [Ticagrelor] Shortness Of Breath  . Clonidine Derivatives Other (See Comments)    Lowers heart rate  . Atorvastatin Other (See Comments)    Possible cause of fatigue/malaise  . Crestor [Rosuvastatin Calcium] Other (See Comments)    Joint/muscle aches  . Exforge [Amlodipine Besylate-Valsartan] Itching and Rash  . Spironolactone Other (See Comments)    Contraindicated with history of hyperkalemia     Objective:  BP 116/76 (BP Location: Right Arm, Patient Position: Sitting, Cuff Size: Normal)   Pulse 98   Temp 98.5 F (36.9 C) (Oral)   Resp 16   Ht 5\' 5"  (1.651 m)   Wt 143 lb (64.9 kg)   SpO2 98%   BMI 23.80 kg/m   Physical Exam  Constitutional: She is oriented to person, place, and time and well-developed, well-nourished, and in no distress. No distress.  HENT:  Right Ear: Tympanic membrane normal.  Left Ear: Tympanic membrane normal.  Mouth/Throat: Oropharynx is clear and moist and mucous membranes are normal.  Cardiovascular: Normal rate, regular rhythm and normal heart sounds.   Pulmonary/Chest: Effort normal and breath sounds normal.  Neurological: She is alert and oriented to person, place, and time. GCS score is 15.  Skin: Skin is warm and dry.  Psychiatric: Mood, memory, affect and judgment normal.  Vitals reviewed.  Dg Chest 2 View  Result Date: 02/18/2016 CLINICAL DATA:  Cough for 3 weeks. EXAM: CHEST  2 VIEW COMPARISON:  One-view chest x-ray 12/05/2015 FINDINGS: The heart size normal. Atherosclerotic calcifications are present in the aorta. Median sternotomy for CABG and atrial appendage clipping is again noted. Left subclavian pacing wires are stable. There is no edema or effusion. No focal airspace disease is present. Osteopenia is  present. IMPRESSION: 1. No acute cardiopulmonary disease or significant interval change. 2. Postsurgical changes as described. Electronically Signed   By: San Morelle M.D.   On: 02/18/2016 16:45    Assessment and Plan :  1. Acute bronchitis, unspecified organism 2. Cough 3. Nasal congestion - benzonatate (TESSALON) 100 MG capsule; Take 1-2 capsules (100-200 mg total) by mouth 3 (three) times daily as needed for cough.  Dispense: 40 capsule; Refill: 0 - DG Chest 2 View; Future - fluticasone (FLONASE) 50 MCG/ACT nasal spray; Place 2 sprays into both nostrils daily.  Dispense: 16 g; Refill: 6 - doxycycline (VIBRA-TABS) 100 MG tablet; Take 1 tablet (100 mg total) by mouth 2 (two) times daily.  Dispense: 20 tablet; Refill: 0 - Supportive care: Push fluids, rest, salt water gargles, warm tea with honey. RTC in 5-7 days if no improvement.   Mercer Pod, PA-C  [facility] 02/18/2016 4:13 PM

## 2016-03-04 ENCOUNTER — Telehealth: Payer: Self-pay | Admitting: Cardiovascular Disease

## 2016-03-04 NOTE — Telephone Encounter (Signed)
Follow up     Returning [name] call about her BP

## 2016-03-04 NOTE — Telephone Encounter (Signed)
Returned call to patient regarding BP issues  She states her BP is down is 158/98 now Was elevated over [id number] systolic and over [id number] diastolic this AM about 1 hour after taking meds and it was elevated last night BP has been running this high for the past few days  Patient states she had a blood vessel burst in her right eye - felt pain with this - this prompted her to check her BP, which was high She has had pains in her left eye, eye is red - no bleeding in her left eye  Patient has been SOB when lying down at night - this has been going on the last 2-3 days; has not been sleeping on any extra pillows; when she is up walking around, she feels better; she states she has swelling of her legs but doesn't think it is that much unless she is on her feet for long periods of time. Patient thinks she has gained weight but is 144lbs w/clothes on and was 143lbs at PCP on 1/9  Patient took lasix 20mg  Monday and this AM - she took this "just in case" she had extra fluid that would make her BP go up - this is not on her med list  Reviewed & verified medications with patient  Advised patient to monitor BP and the message would be sent to Dr. Loletha Grayer for further review

## 2016-03-04 NOTE — Telephone Encounter (Signed)
Left msg for patient to call. 

## 2016-03-04 NOTE — Telephone Encounter (Signed)
New Message  Pt c/o BP issue: STAT if pt c/o blurred vision, one-sided weakness or slurred speech  1. What are your last 5 BP readings? Per pt states bp over 200 for top number and over 100 for the bottom number.  2. Are you having any other symptoms (ex. Dizziness, headache, blurred vision, passed out)? Per pt states SOB when laying down at night   3. What is your BP issue? Pt would like to know if she needs to be seen due to her high bp. Pt states bp has been high for the past 2-3 days. She states she doubled the medication to get the bp to decrease. Pt would like to speak with RN for further instructions. Please call back to discuss

## 2016-03-05 ENCOUNTER — Telehealth: Payer: Self-pay | Admitting: Cardiovascular Disease

## 2016-03-05 NOTE — Telephone Encounter (Signed)
Follow up     Returning McDonald call from earlier today

## 2016-03-05 NOTE — Telephone Encounter (Signed)
Please have her take twice her usual dose of carvedilol (6.25 instead of 3.125 mg) twice daily. Please also record her heart rate when she checks BP and call us back. [name]

## 2016-03-05 NOTE — Telephone Encounter (Signed)
Called, see other note.

## 2016-03-05 NOTE — Telephone Encounter (Signed)
LMTCB

## 2016-03-05 NOTE — Telephone Encounter (Signed)
Reviewed Dr. Victorino December recommendations with patient, who voiced understanding and thanks for call. She will make changes, enact Hr checks w monitoring, and is aware to call if new concerns.  Offered rx to her pharmacy, she voices she has plenty of medication, will call us when refill needed.

## 2016-03-09 ENCOUNTER — Telehealth: Payer: Self-pay | Admitting: Cardiovascular Disease

## 2016-03-09 NOTE — Telephone Encounter (Signed)
She says she wanted to talk to you about her blood pressure.She also wants to talk about all the side effects she is having from her medicine.

## 2016-03-09 NOTE — Telephone Encounter (Signed)
Pt of Dr. Sallyanne Kuster We increased carvedilol from 3.125mg  BID to 6.25mg  BID last week.  Patient called to give me updates.  Concerns last week regarding her BP elevations, notes she tried to comply w increase on carvedilol, but that, as in the past, she had a hard time with this. Notes that a couple months ago she passed out in the [place] parking lot, at the time she was on the 6.25mg  carvedilol BID, subsequently scaled back.  She thinks she is super sensitive to the medication. Notes her BPs were running 180/110 prior to adjustment  She voices a BP reading of 80/50 late last week after dose increase, similar symptoms as preceded her syncopal episode. She states she has been taking carvedilol 3.125 three times a day for the past 2-3 days and has noted this seems to do well for her. BP running about 140s/70s. No symptoms noted.  She also explains that she has been followed by Dr. Chalmers Cater for concerns regarding her thyroid, is waiting on definitive information about this. Cites concern that her HR and BP have been up, and that abnormal thyroid function might be at issue.  States her concern that the amiodarone is causing or contributing to this. She voices she wants to stop this med if there is an alternative.  Aware that I will seek review of her concerns by MD. She voiced thanks for call.

## 2016-03-09 NOTE — Telephone Encounter (Signed)
If I understand it correctly she has a parathyroid problem (hypercalcemia due to excess parathormone), NOT a thyroid problem. Amiodarone frequently interferes with thyroid function, but not with the parathyroid. From a function point of view, the glands have very similar names and anatomical location, but otherwise little to do with each other. [name]

## 2016-03-10 NOTE — Telephone Encounter (Signed)
Left msg to call.

## 2016-03-11 NOTE — Telephone Encounter (Signed)
Spoke to patient - she returned my call. Discussed thyroid vs parathyroid and recommendations from Dr. Sallyanne Kuster. Pt indicates she is going to f/u w Dr. Chalmers Cater next week to get further recommendations regarding her symptoms and concerns. She expressed interest in scheduling for 3 mo f/u w Dr. Loletha Grayer (last seen in November) and I offered appt next week. She declined stating she sees Dr. Chalmers Cater next week, would take next available w Dr. Loletha Grayer (early April). Scheduled and she is aware of appt details. Aware to call if new concerns/questions.

## 2016-03-12 ENCOUNTER — Other Ambulatory Visit: Payer: Self-pay | Admitting: *Deleted

## 2016-03-12 MED ORDER — AMIODARONE HCL 100 MG PO TABS: 100.0000 mg | ORAL_TABLET | Freq: Every day | ORAL | 1 refills | Status: DC

## 2016-03-12 MED ORDER — CARVEDILOL 6.25 MG PO TABS: 6.2500 mg | ORAL_TABLET | Freq: Two times a day (BID) | ORAL | 1 refills | Status: DC

## 2016-03-12 MED ORDER — LISINOPRIL 20 MG PO TABS: 30.0000 mg | ORAL_TABLET | Freq: Every day | ORAL | 1 refills | Status: DC

## 2016-03-12 MED ORDER — APIXABAN 5 MG PO TABS: 5.0000 mg | ORAL_TABLET | Freq: Two times a day (BID) | ORAL | 1 refills | Status: DC

## 2016-03-12 NOTE — Telephone Encounter (Signed)
Patient left a msg on the refill vm requesting that all of her prescriptions be sent to [facility]. Patient can be reached at [phone]. Thanks, MI

## 2016-03-12 NOTE — Telephone Encounter (Signed)
Rx(s) sent to pharmacy electronically.  

## 2016-03-17 DIAGNOSIS — E213 Hyperparathyroidism, unspecified: Secondary | ICD-10-CM | POA: Diagnosis not present

## 2016-03-17 DIAGNOSIS — E038 Other specified hypothyroidism: Secondary | ICD-10-CM | POA: Diagnosis not present

## 2016-03-24 ENCOUNTER — Other Ambulatory Visit: Payer: Self-pay | Admitting: Cardiovascular Disease

## 2016-03-24 ENCOUNTER — Telehealth: Payer: Self-pay

## 2016-03-24 DIAGNOSIS — E559 Vitamin D deficiency, unspecified: Secondary | ICD-10-CM | POA: Diagnosis not present

## 2016-03-24 DIAGNOSIS — E038 Other specified hypothyroidism: Secondary | ICD-10-CM | POA: Diagnosis not present

## 2016-03-24 DIAGNOSIS — Z23 Encounter for immunization: Secondary | ICD-10-CM | POA: Diagnosis not present

## 2016-03-24 DIAGNOSIS — E213 Hyperparathyroidism, unspecified: Secondary | ICD-10-CM | POA: Diagnosis not present

## 2016-03-24 DIAGNOSIS — M810 Age-related osteoporosis without current pathological fracture: Secondary | ICD-10-CM | POA: Diagnosis not present

## 2016-03-24 NOTE — Telephone Encounter (Signed)
Patient calling the office for samples of medication: ° ° °1.  What medication and dosage are you requesting samples for?  Eliquis  ° °2.  Are you currently out of this medication? no ° ° °

## 2016-03-24 NOTE — Telephone Encounter (Signed)
No saamples available

## 2016-03-24 NOTE — Telephone Encounter (Signed)
Left detailed message.   

## 2016-03-24 NOTE — Telephone Encounter (Signed)
Received request for vascular studies from [facility]. Requested information faxed to [facility] as requested on 03/06/16.

## 2016-04-07 DIAGNOSIS — H348312 Tributary (branch) retinal vein occlusion, right eye, stable: Secondary | ICD-10-CM | POA: Diagnosis not present

## 2016-04-07 DIAGNOSIS — Z961 Presence of intraocular lens: Secondary | ICD-10-CM | POA: Diagnosis not present

## 2016-04-07 DIAGNOSIS — H26492 Other secondary cataract, left eye: Secondary | ICD-10-CM | POA: Diagnosis not present

## 2016-04-07 DIAGNOSIS — H04123 Dry eye syndrome of bilateral lacrimal glands: Secondary | ICD-10-CM | POA: Diagnosis not present

## 2016-04-07 DIAGNOSIS — H35373 Puckering of macula, bilateral: Secondary | ICD-10-CM | POA: Diagnosis not present

## 2016-04-07 DIAGNOSIS — H2511 Age-related nuclear cataract, right eye: Secondary | ICD-10-CM | POA: Diagnosis not present

## 2016-04-08 ENCOUNTER — Telehealth: Payer: Self-pay | Admitting: Cardiovascular Disease

## 2016-04-08 NOTE — Telephone Encounter (Signed)
New Message     Please call if you have any    Patient calling the office for samples of medication:   1.  What medication and dosage are you requesting samples for?  Eliquis  5 mg  2.  Are you currently out of this medication?  Has 5 left

## 2016-04-08 NOTE — Telephone Encounter (Signed)
Medication samples have been provided to the patient.  Drug name: Eliquis 5mg   Qty: 28 LOT: [id number]  Exp.Date: 04/2018  Samples left at front desk for patient pick-up.  Returned call to make aware, no answer, lmtcb.

## 2016-04-16 DIAGNOSIS — R8290 Unspecified abnormal findings in urine: Secondary | ICD-10-CM | POA: Diagnosis not present

## 2016-04-16 DIAGNOSIS — I10 Essential (primary) hypertension: Secondary | ICD-10-CM | POA: Diagnosis not present

## 2016-05-07 DIAGNOSIS — E213 Hyperparathyroidism, unspecified: Secondary | ICD-10-CM | POA: Diagnosis not present

## 2016-05-07 DIAGNOSIS — R8290 Unspecified abnormal findings in urine: Secondary | ICD-10-CM | POA: Diagnosis not present

## 2016-05-13 ENCOUNTER — Ambulatory Visit (INDEPENDENT_AMBULATORY_CARE_PROVIDER_SITE_OTHER): Payer: Medicare HMO | Admitting: Cardiovascular Disease

## 2016-05-13 ENCOUNTER — Encounter: Payer: Self-pay | Admitting: Cardiovascular Disease

## 2016-05-13 VITALS — BP 110/60 | HR 90 | Ht 65.0 in | Wt 136.0 lb

## 2016-05-13 DIAGNOSIS — I480 Paroxysmal atrial fibrillation: Secondary | ICD-10-CM

## 2016-05-13 DIAGNOSIS — E7800 Pure hypercholesterolemia, unspecified: Secondary | ICD-10-CM

## 2016-05-13 DIAGNOSIS — E213 Hyperparathyroidism, unspecified: Secondary | ICD-10-CM | POA: Diagnosis not present

## 2016-05-13 DIAGNOSIS — I739 Peripheral vascular disease, unspecified: Secondary | ICD-10-CM | POA: Diagnosis not present

## 2016-05-13 DIAGNOSIS — Z950 Presence of cardiac pacemaker: Secondary | ICD-10-CM

## 2016-05-13 DIAGNOSIS — I484 Atypical atrial flutter: Secondary | ICD-10-CM

## 2016-05-13 DIAGNOSIS — I5032 Chronic diastolic (congestive) heart failure: Secondary | ICD-10-CM

## 2016-05-13 DIAGNOSIS — I25810 Atherosclerosis of coronary artery bypass graft(s) without angina pectoris: Secondary | ICD-10-CM | POA: Diagnosis not present

## 2016-05-13 DIAGNOSIS — I495 Sick sinus syndrome: Secondary | ICD-10-CM | POA: Diagnosis not present

## 2016-05-13 LAB — CUP PACEART INCLINIC DEVICE CHECK
Battery Impedance: 278 Ohm
Battery Remaining Longevity: 106 mo
Battery Voltage: 2.78 V
Brady Statistic AP VP Percent: 0 %
Brady Statistic AP VS Percent: 44 %
Brady Statistic AS VP Percent: 2 %
Brady Statistic AS VS Percent: 53 %
Date Time Interrogation Session: 20180404131404
Implantable Lead Implant Date: 20131212
Implantable Lead Implant Date: 20131212
Implantable Lead Location: 753859
Implantable Lead Location: 753860
Implantable Lead Model: 5076
Implantable Lead Model: 5076
Implantable Pulse Generator Implant Date: 20131212
Lead Channel Impedance Value: 392 Ohm
Lead Channel Impedance Value: 416 Ohm
Lead Channel Pacing Threshold Amplitude: 0.75 V
Lead Channel Pacing Threshold Amplitude: 0.75 V
Lead Channel Pacing Threshold Pulse Width: 0.4 ms
Lead Channel Pacing Threshold Pulse Width: 0.4 ms
Lead Channel Sensing Intrinsic Amplitude: 0.7 mV
Lead Channel Sensing Intrinsic Amplitude: 11.2 mV
Lead Channel Setting Pacing Amplitude: 2 V
Lead Channel Setting Pacing Amplitude: 2.5 V
Lead Channel Setting Pacing Pulse Width: 0.4 ms
Lead Channel Setting Sensing Sensitivity: 5.6 mV

## 2016-05-13 MED ORDER — AMIODARONE HCL 200 MG PO TABS: 200.0000 mg | ORAL_TABLET | Freq: Every day | ORAL | 3 refills | Status: DC

## 2016-05-13 MED ORDER — LISINOPRIL 20 MG PO TABS: 20.0000 mg | ORAL_TABLET | Freq: Every day | ORAL | 3 refills | Status: DC

## 2016-05-13 MED ORDER — LISINOPRIL 10 MG PO TABS: 10.0000 mg | ORAL_TABLET | Freq: Every day | ORAL | 3 refills | Status: DC

## 2016-05-13 NOTE — Patient Instructions (Addendum)
Dr Sallyanne Kuster has recommended making the following medication changes: 1. DECREASE Lisinopril to 20 mg daily 2. INCREASE Amiodarone to 200 mg daily  Remote monitoring is used to monitor your Pacemaker of ICD from home. This monitoring reduces the number of office visits required to check your device to one time per year. It allows [place] to keep an eye on the functioning of your device to ensure it is working properly. You are scheduled for a device check from home on Thursday, July 5th, 2018. You may send your transmission at any time that day. If you have a wireless device, the transmission will be sent automatically. After your physician reviews your transmission, you will receive a postcard with your next transmission date.  Dr Sallyanne Kuster recommends that you schedule a follow-up appointment in 6 months with a pacemaker check. You will receive a reminder letter in the mail two months in advance. If you don't receive a letter, please call our office to schedule the follow-up appointment.  If you need a refill on your cardiac medications before your next appointment, please call your pharmacy.

## 2016-05-13 NOTE — Progress Notes (Signed)
Patient ID: [name], female   DOB: 1940/08/02, 76 y.o.   MRN: [id number]     Cardiology Office Note    Date:  05/13/2016   ID:  [name], DOB 1941/02/06, MRN [id number]  PCP:  [name], MD  Cardiologist:   Sanda Klein, MD   Chief Complaint  Patient presents with  . Follow-up    chest pin and no energy    History of Present Illness:  [name] is a 76 y.o. female with coronary artery disease and recent bypass surgery, symptomatic paroxysmal atrial fibrillation.  She has not had new problems with syncope or any recurrent GI bleeding. She is on appropriate anticoagulation with Eliquis.  Her home blood pressure monitor has shown very volatile readings. Checked against the office sphygmomanometer, the device shows readings are at 20-40 mmHg higher. Her initial blood pressure was 110/60. When I rechecked her it was 99/58. The home monitor showed 110/71 on one occasion, then 140/80.  Her rhythm is slightly irregular today. She has atrial flutter with mostly 2:1 AV block, but with periods of higher grade AV block and an overall ventricular rate of 90 bpm. This might explain some of the difficulty her automatic cuff hasn't checking her blood pressure. Review of her pacemaker shows that she had a lot of atrial arrhythmia in January, things subsequently subsided. The current episode started about 33 hours ago. Average ventricular rate during this last event is 81 bpm, but in January the average ventricular rate was around 100 bpm.  The overall burden of atrial arrhythmia has been roughly 13%. She has about 45% atrial pacing and 2.7% ventricular pacing. Lead parameters are excellent. Battery longevity is estimated at about 9 years.  She is currently tolerating a low dose of amiodarone. She had neuropathic numbness and discomfort in her hands and feet on higher doses in the past.  She is not on a statin, stopped due to musculoskeletal complaints.  She has normal left  ventricular systolic function. She does have evidence of grade 2 diastolic dysfunction on previous echo. At the time of her bypass surgery she received an Atricure left atrial appendage clip. She is also on clopidogrel following her drug-eluting stents in April 2017. The pacemaker was implanted in 2013 for symptomatic sinus bradycardia. She also has a history of peripheral arterial disease and received bilateral superficial femoral artery stents about 3 years ago. She has never smoked but drinks daily. She has treated hypertension.  After undergoing bypass surgery in November 2016 she returned with non-ST segment elevation myocardial infarction in April 2017 and required placement of stents both in the native right coronary artery (3.020 mm Synergy DES) and in the saphenous vein graft to the distal right coronary artery (2.7538 mm Synergy DES) due to early graft dysfunction. The saphenous vein graft to the ramus intermedius was also occluded, but this vessel was left for medical therapy.    Past Medical History:  Diagnosis Date  . ANXIETY 12/31/2009  . Aortic insufficiency    a. mod by echo 2017.  Marland Kitchen CAD (coronary artery disease)    a. s/p CABGx3 and LAA clipping in 12/2014, NSTEMI 05/2015 s/p DES to native RCA and SVG-dRCA; occluded ramus-SVG was treated medically). c. neg nuc 10/2015 at [facility].  . Chronic diastolic CHF (congestive heart failure) ([place])   . Chronic fatigue   . CKD (chronic kidney disease), stage II   . DEPRESSION 12/31/2009  . [place] DISEASE, CERVICAL 12/31/2009  . DISC DISEASE, LUMBAR  12/31/2009  . DIVERTICULITIS, HX OF 12/31/2009  . Essential hypertension   . GLUCOSE INTOLERANCE 12/31/2009  . Habitual alcohol use   . Hypercalcemia   . Hyperkalemia   . Hyperlipidemia 12/31/2009  . MENOPAUSE, EARLY 12/31/2009  . NSTEMI (non-ST elevated myocardial infarction) ([place]) 05/11/2015  . Orthostatic hypotension   . OSTEOARTHRITIS, HAND   . Pacemaker 01/21/2012   MDT Adapta dual chamber    . Paroxysmal atrial flutter ([place])   . Persistent atrial fibrillation ([place])   . Pre-diabetes   . PVD (peripheral vascular disease), hx stents to bil SFAs 02/2010   . Symptomatic sinus bradycardia 01/22/2012  . Syncope    a. 11/2015 ? due to medications.  . Tachy-brady syndrome ([place])    a. s/p MDT PPM 2013.  . Tricuspid regurgitation     Past Surgical History:  Procedure Laterality Date  . AUGMENTATION MAMMAPLASTY  1983  . CARDIAC CATHETERIZATION N/A 01/01/2015   Procedure: Left Heart Cath and Coronary Angiography;  Surgeon: Jettie Booze, MD;  Location: [place] [facility];  Service: Cardiovascular;  Laterality: N/A;  . CARDIAC CATHETERIZATION N/A 05/12/2015   Procedure: Left Heart Cath and Coronary Angiography;  Surgeon: Troy Sine, MD;  Location: [place] [facility];  Service: Cardiovascular;  Laterality: N/A;  . CARDIAC CATHETERIZATION N/A 05/12/2015   Procedure: Coronary Stent Intervention;  Surgeon: Troy Sine, MD;  Location: [place] [facility];  Service: Cardiovascular;  Laterality: N/A;  . CARDIOVERSION N/A 02/01/2015   Procedure: CARDIOVERSION;  Surgeon: Lelon Perla, MD;  Location: [facility] ENDOSCOPY;  Service: Cardiovascular;  Laterality: N/A;  . CARDIOVERSION N/A 08/30/2015   Procedure: CARDIOVERSION;  Surgeon: Sanda Klein, MD;  Location: [place] ENDOSCOPY;  Service: Cardiovascular;  Laterality: N/A;  . CARPAL TUNNEL RELEASE  2008   "right hand/thumb; carpal tunnel repair; got rid of arthritis" (01/21/2012)  . CLIPPING OF ATRIAL APPENDAGE N/A 01/04/2015   Procedure: CLIPPING OF ATRIAL APPENDAGE;  Surgeon: Grace Isaac, MD;  Location: [place];  Service: Open Heart Surgery;  Laterality: N/A;  . COLONOSCOPY N/A 12/21/2015   Procedure: COLONOSCOPY;  Surgeon: Milus Banister, MD;  Location: [facility] ENDOSCOPY;  Service: Endoscopy;  Laterality: N/A;  . CORONARY ARTERY BYPASS GRAFT N/A 01/04/2015   Procedure: CORONARY ARTERY BYPASS GRAFTING (CABG) x 3 using left internal mammory  artery and greater saphenous vein right leg harvested endoscopically.;  Surgeon: Grace Isaac, MD;  LIMA-LAD, SVG-RI, SVG-PDA  . ESOPHAGOGASTRODUODENOSCOPY (EGD) WITH PROPOFOL N/A 12/18/2015   Procedure: ESOPHAGOGASTRODUODENOSCOPY (EGD) WITH PROPOFOL;  Surgeon: Milus Banister, MD;  Location: [facility] ENDOSCOPY;  Service: Endoscopy;  Laterality: N/A;  . FACELIFT, LOWER 2/3  1995   "mini" (01/21/2012)  . GIVENS CAPSULE STUDY N/A 12/21/2015   Procedure: GIVENS CAPSULE STUDY;  Surgeon: Milus Banister, MD;  Location: [facility] ENDOSCOPY;  Service: Endoscopy;  Laterality: N/A;  . Lower Arterial Examination  10/28/2011   R. SFA stent mild-moderate mixed density plaque with elevated velocities consistent with 50% diameter reduction. L. SFA stent moderate mixed denisty plaque at mid to distal level consistent with 50-69% diameter reduction.  . OOPHORECTOMY  ~1979  . PARTIAL COLECTOMY  2010  . PERIPHERAL ARTERIAL STENT GRAFT  2012; 2012   "LLE; RLE" (01/21/2012)  . PERMANENT PACEMAKER INSERTION N/A 01/21/2012   Medtronic Adapta L implanted by Dr Sallyanne Kuster for tachy/brady syndrome  . POSTERIOR CERVICAL LAMINECTOMY  1985  . TEE WITHOUT CARDIOVERSION N/A 01/04/2015   Procedure: TRANSESOPHAGEAL ECHOCARDIOGRAM (TEE);  Surgeon: Grace Isaac, MD;  Location: [facility] OR;  Service: Open Heart Surgery;  Laterality: N/A;  . TEE WITHOUT CARDIOVERSION N/A 02/01/2015   Procedure: TRANSESOPHAGEAL ECHOCARDIOGRAM (TEE);  Surgeon: Lelon Perla, MD;  Location: [facility] ENDOSCOPY;  Service: Cardiovascular;  Laterality: N/A;  . TEE WITHOUT CARDIOVERSION N/A 08/30/2015   Procedure: TRANSESOPHAGEAL ECHOCARDIOGRAM (TEE);  Surgeon: Sanda Klein, MD;  Location: [facility] ENDOSCOPY;  Service: Cardiovascular;  Laterality: N/A;  . VAGINAL HYSTERECTOMY  1975    Current Medications: Outpatient Medications Prior to Visit  Medication Sig Dispense Refill  . apixaban (ELIQUIS) 5 MG TABS tablet Take 1 tablet (5 mg total) by mouth 2 (two) times daily.  180 tablet 1  . carvedilol (COREG) 6.25 MG tablet Take 1 tablet (6.25 mg total) by mouth 2 (two) times daily with a meal. (Patient taking differently: Take 3.125 mg by mouth 2 (two) times daily with a meal. ) 180 tablet 1  . cholecalciferol (VITAMIN D) 1000 UNITS tablet Take 1,000 Units by mouth at bedtime.     . citalopram (CELEXA) 20 MG tablet Take 0.5 tablets (10 mg total) by mouth daily. (Patient taking differently: Take 10 mg by mouth every morning. )    . Coenzyme Q10 (COQ10 PO) Take 120 mg by mouth at bedtime.     . fluticasone (FLONASE) 50 MCG/ACT nasal spray Place 2 sprays into both nostrils daily. 16 g 6  . Multiple Vitamin (MULTIVITAMIN) capsule Take 1 capsule by mouth 2 (two) times daily.     . nitroGLYCERIN (NITROSTAT) 0.4 MG SL tablet Place 1 tablet (0.4 mg total) under the tongue every 5 (five) minutes x 3 doses as needed for chest pain. 25 tablet 2  . amiodarone (PACERONE) 100 MG tablet Take 1 tablet (100 mg total) by mouth daily. 90 tablet 1  . lisinopril (PRINIVIL,ZESTRIL) 20 MG tablet Take 1.5 tablets (30 mg total) by mouth daily. 135 tablet 1  . benzonatate (TESSALON) 100 MG capsule Take 1-2 capsules (100-200 mg total) by mouth 3 (three) times daily as needed for cough. (Patient not taking: Reported on 05/13/2016) 40 capsule 0  . doxycycline (VIBRA-TABS) 100 MG tablet Take 1 tablet (100 mg total) by mouth 2 (two) times daily. (Patient not taking: Reported on 05/13/2016) 20 tablet 0   No facility-administered medications prior to visit.      Allergies:   Brilinta [ticagrelor]; Clonidine derivatives; Atorvastatin; Crestor [rosuvastatin calcium]; Exforge [amlodipine besylate-valsartan]; and Spironolactone   Social History   Social History  . Marital status: Widowed    Spouse name: N/A  . Number of children: 3  . Years of education: N/A   Occupational History  . Retired Chemical engineer Retired   Social History Main Topics  . Smoking status: Former Smoker    Packs/day: 0.75     Years: 10.00    Types: Cigarettes  . Smokeless tobacco: Never Used     Comment: 01/21/2012 "quit smoking ~ 2002"  . Alcohol use Yes     Comment: 01/21/2012 "3 times/wk I have a couple mixed drinks"; 06/28/2015 "nothing in the last 3 weeks cause I haven't felt good", reduced recently  . Drug use: No  . Sexual activity: Not Currently   Other Topics Concern  . None   Social History Narrative   Lives in [place].  Retired.     Family History:  The patient's family history includes CVA in her brother; Heart attack in her brother; Heart disease in her brother, father, and mother; Hypertension in her brother and mother; Stroke in her father  and mother.   ROS:   Please see the history of present illness.    ROS All other systems reviewed and are negative.   PHYSICAL EXAM:   VS:  BP 110/60   Pulse 90   Ht 5\' 5"  (1.651 m)   Wt 61.7 kg (136 lb)   BMI 22.63 kg/m     GEN: Well nourished, well developed, in no acute distress  HEENT: normal  Neck: no JVD, carotid bruits, or masses Cardiac: RRR; no murmurs, rubs, or gallops,no edema  Respiratory:  clear to auscultation bilaterally, normal work of breathing GI: soft, nontender, nondistended, + BS MS: no deformity or atrophy  Skin: warm and dry, no rash Neuro:  Alert and Oriented x 3, Strength and sensation are intact Psych: euthymic mood, full affect  Wt Readings from Last 3 Encounters:  05/13/16 61.7 kg (136 lb)  02/18/16 64.9 kg (143 lb)  01/09/16 64 kg (141 lb)      Studies/Labs Reviewed:   EKG:  EKG is not ordered today.    Recent Labs: 09/16/2015: TSH 5.10 12/02/2015: B Natriuretic Peptide 499.0 12/23/2015: ALT 20; BUN 13; Creatinine, Ser 0.86; Hemoglobin 9.8; Magnesium 2.0; Platelets 256; Potassium 4.6; Sodium 138   Lipid Panel    Component Value Date/Time   CHOL 158 05/12/2015 0324   TRIG 114 05/12/2015 0324   HDL 64 05/12/2015 0324   CHOLHDL 2.5 05/12/2015 0324   VLDL 23 05/12/2015 0324   LDLCALC 71  05/12/2015 0324   10/04/2015  BUN 38 creatinine 1.46, Ca 11.6, K 5.8.  Total cholesterol 244, triglycerides 195, HDL 63, LDL 142 10/15/2015  creatinine 1.05, BUN 23, K 6.3, Ca 11.6, ionized calcium  6.0. Intact PTH 83. TSH of 4.88,  free T4 of 1.13.  ASSESSMENT:    1. Chronic diastolic heart failure ([place])   2. Coronary artery disease involving coronary bypass graft of native heart without angina pectoris   3. Pure hypercholesterolemia   4. Sinus node dysfunction (H[facility])   5. PAF (paroxysmal atrial fibrillation) ([place])   6. Pacemaker   7. PVD (peripheral vascular disease), hx stents to bil SFAs 02/2010   8. Hyperparathyroidism ([place])      PLAN:  In order of problems listed above:  1. CHF: Euvolemic clinically today. Seemed to be "dry" at 130 pounds and was in heart failure at 145 pounds. Currently very close to the estimated dry weight of around 135 pounds. Preserved left ventricular systolic function. NYHA class 2.  2. CAD: Currently without symptoms of angina pectoris. Unfortunately, she had early failure over 2 saphenous vein grafts, one of which was treated with a drug-eluting stent. Her mammary artery bypass was widely patent.. 3. HLP: Not sure whether her weakness was truly statin related. Her aches and pains and weakness may have been due to hypercalcemia. Current lipid profile shows unacceptably elevated levels of LDL cholesterol. Plan to restart a statin once the rest of her metabolic problems are compensated. Will wait and see how she responds to the higher dose of amiodarone. Might need PCSK9 inhibitors. 4. SSS: Her pacemaker shows good distribution of heart rate histogram when she is in normal rhythm.  5. AFib: Increased burden of arrhythmia. Today has a relatively slow atrial flutter with atrial rate 200 bpm, mostly 2:1 AV block and a ventricular rate of around 90-100 bpm at rest. She has an atrial appendage clip and is on Eliquis also. She had neuropathic symptoms that might be  related to amiodarone, improved at a dose  100 mg daily. Not sure how much the hypercalcemia was actually causing this. Will try to increase the amiodarone back to 200 mg daily. He would benefit from the higher dose both for improved rate control and improve rhythm control. Check liver and thyroid function tests at least every 6 months 6. PPM: Normal device function. Remote download every 3 months and yearly office visits. 7. PAD: She describes fatigue rather than intermittent claudication. Bilateral lower extremity Doppler study showed normal ABIs and waveforms in both extremities. Blood pressure seems to be slightly lower in the left upper extremity 8. HTN: Appears that her home monitor has been giving [place] erroneous information and as a general rule was overestimating her blood pressure. Decrease ACE inhibitor dose slightly. Instructed to have her blood pressure checked in the right upper extremity only. 9. Hypercalcemia: Labs suggest she has primary hyperparathyroidism. The hypercalcemia may explain some of her fatigue/aches and pains, maybe also her erratic blood pressure. Seeing Dr. Chalmers Cater.    Medication Adjustments/Labs and Tests Ordered: Current medicines are reviewed at length with the patient today.  Concerns regarding medicines are outlined above.  Medication changes, Labs and Tests ordered today are listed in the Patient Instructions below. Patient Instructions  Dr Sallyanne Kuster has recommended making the following medication changes: 1. DECREASE Lisinopril to 20 mg daily 2. INCREASE Amiodarone to 200 mg daily  Remote monitoring is used to monitor your Pacemaker of ICD from home. This monitoring reduces the number of office visits required to check your device to one time per year. It allows [place] to keep an eye on the functioning of your device to ensure it is working properly. You are scheduled for a device check from home on Thursday, July 5th, 2018. You may send your transmission at any time that  day. If you have a wireless device, the transmission will be sent automatically. After your physician reviews your transmission, you will receive a postcard with your next transmission date.  Dr Sallyanne Kuster recommends that you schedule a follow-up appointment in 6 months with a pacemaker check. You will receive a reminder letter in the mail two months in advance. If you don't receive a letter, please call our office to schedule the follow-up appointment.  If you need a refill on your cardiac medications before your next appointment, please call your pharmacy.    Signed, Sanda Klein, MD  05/13/2016 2:45 PM    [facility] [address], [place],   [address] Phone: [phone]; Fax: [phone]

## 2016-05-18 ENCOUNTER — Telehealth: Payer: Self-pay | Admitting: Cardiovascular Disease

## 2016-05-18 NOTE — Telephone Encounter (Signed)
New message    Patient calling the office for samples of medication:   1.  What medication and dosage are you requesting samples for? apixaban (ELIQUIS) 5 MG TABS tablet  2.  Are you currently out of this medication? She will be out on Saturday

## 2016-05-19 ENCOUNTER — Other Ambulatory Visit: Payer: Self-pay | Admitting: Family Medicine

## 2016-05-19 DIAGNOSIS — R1013 Epigastric pain: Secondary | ICD-10-CM | POA: Diagnosis not present

## 2016-05-21 ENCOUNTER — Ambulatory Visit
Admission: RE | Admit: 2016-05-21 | Discharge: 2016-05-21 | Disposition: A | Discharge status: Home / Self Care | Payer: Medicare HMO | Source: Ambulatory Visit | Attending: Family Medicine | Admitting: Family Medicine

## 2016-05-21 DIAGNOSIS — R1011 Right upper quadrant pain: Secondary | ICD-10-CM | POA: Diagnosis not present

## 2016-05-21 DIAGNOSIS — R1013 Epigastric pain: Secondary | ICD-10-CM

## 2016-05-25 ENCOUNTER — Encounter: Payer: Self-pay | Admitting: Cardiovascular Disease

## 2016-05-25 ENCOUNTER — Telehealth: Payer: Self-pay | Admitting: Cardiovascular Disease

## 2016-05-25 NOTE — Telephone Encounter (Signed)
Spoke patient informed patient - send information thorough mychart for DR C. satinet states she would. She would not give details

## 2016-05-25 NOTE — Telephone Encounter (Signed)
SPOKE TO PATIENT SHE STATES SHE IS TYPING IN WORD - SHE DECIDING WHETHER TO SEND IT THOROUGH MYCHART OR REGULAR MAIL -  MAY GET TOMORROW OR THOROUGH MAIL.

## 2016-05-25 NOTE — Telephone Encounter (Signed)
Haven't seen it come through yet.Marland KitchenMarland Kitchen

## 2016-05-25 NOTE — Telephone Encounter (Signed)
Pt says she wants to send Dr C a long message and she wants to send it by e-mail.

## 2016-05-27 ENCOUNTER — Telehealth: Payer: Self-pay | Admitting: Cardiovascular Disease

## 2016-05-27 NOTE — Telephone Encounter (Signed)
New Message  Pt voiced she is calling to get an update on her email/mychart message she sent to MD.

## 2016-05-27 NOTE — Telephone Encounter (Signed)
Spoke with patient and advised that Dr Sallyanne Kuster working at hospital this week but will address the emails as soon as he gets a chance possibly next week Patient appreciated call back

## 2016-05-30 ENCOUNTER — Ambulatory Visit (INDEPENDENT_AMBULATORY_CARE_PROVIDER_SITE_OTHER): Payer: Medicare HMO | Admitting: Emergency Medicine

## 2016-05-30 ENCOUNTER — Encounter (HOSPITAL_COMMUNITY): Payer: Self-pay | Admitting: Emergency Medicine

## 2016-05-30 ENCOUNTER — Emergency Department (HOSPITAL_COMMUNITY): Payer: Medicare HMO

## 2016-05-30 ENCOUNTER — Emergency Department (HOSPITAL_COMMUNITY)
Admission: EM | Admit: 2016-05-30 | Discharge: 2016-05-30 | Disposition: A | Discharge status: Home / Self Care | Payer: Medicare HMO | Attending: Emergency Medicine | Admitting: Emergency Medicine

## 2016-05-30 VITALS — BP 159/95 | HR 87 | Temp 97.8°F | Resp 17 | Ht 65.0 in | Wt 145.5 lb

## 2016-05-30 DIAGNOSIS — Z87891 Personal history of nicotine dependence: Secondary | ICD-10-CM | POA: Diagnosis not present

## 2016-05-30 DIAGNOSIS — I110 Hypertensive heart disease with heart failure: Secondary | ICD-10-CM | POA: Diagnosis not present

## 2016-05-30 DIAGNOSIS — R0602 Shortness of breath: Secondary | ICD-10-CM

## 2016-05-30 DIAGNOSIS — I5032 Chronic diastolic (congestive) heart failure: Secondary | ICD-10-CM

## 2016-05-30 DIAGNOSIS — I130 Hypertensive heart and chronic kidney disease with heart failure and stage 1 through stage 4 chronic kidney disease, or unspecified chronic kidney disease: Secondary | ICD-10-CM | POA: Diagnosis not present

## 2016-05-30 DIAGNOSIS — I2510 Atherosclerotic heart disease of native coronary artery without angina pectoris: Secondary | ICD-10-CM | POA: Diagnosis not present

## 2016-05-30 DIAGNOSIS — Z79899 Other long term (current) drug therapy: Secondary | ICD-10-CM | POA: Insufficient documentation

## 2016-05-30 DIAGNOSIS — R079 Chest pain, unspecified: Secondary | ICD-10-CM | POA: Diagnosis not present

## 2016-05-30 DIAGNOSIS — Z951 Presence of aortocoronary bypass graft: Secondary | ICD-10-CM | POA: Insufficient documentation

## 2016-05-30 DIAGNOSIS — I252 Old myocardial infarction: Secondary | ICD-10-CM | POA: Diagnosis not present

## 2016-05-30 DIAGNOSIS — I25119 Atherosclerotic heart disease of native coronary artery with unspecified angina pectoris: Secondary | ICD-10-CM

## 2016-05-30 DIAGNOSIS — Z7901 Long term (current) use of anticoagulants: Secondary | ICD-10-CM | POA: Insufficient documentation

## 2016-05-30 DIAGNOSIS — I38 Endocarditis, valve unspecified: Secondary | ICD-10-CM

## 2016-05-30 DIAGNOSIS — I5033 Acute on chronic diastolic (congestive) heart failure: Secondary | ICD-10-CM | POA: Diagnosis not present

## 2016-05-30 DIAGNOSIS — N182 Chronic kidney disease, stage 2 (mild): Secondary | ICD-10-CM | POA: Insufficient documentation

## 2016-05-30 LAB — I-STAT TROPONIN, ED: Troponin i, poc: 0 ng/mL (ref 0.00–0.08)

## 2016-05-30 LAB — CBC WITH DIFFERENTIAL/PLATELET
Basophils Absolute: 0 10*3/uL (ref 0.0–0.1)
Basophils Relative: 1 %
Eosinophils Absolute: 0.2 10*3/uL (ref 0.0–0.7)
Eosinophils Relative: 3 %
HCT: 35.5 % — ABNORMAL LOW (ref 36.0–46.0)
Hemoglobin: 11.5 g/dL — ABNORMAL LOW (ref 12.0–15.0)
Lymphocytes Relative: 29 %
Lymphs Abs: 1.7 10*3/uL (ref 0.7–4.0)
MCH: 26.8 pg (ref 26.0–34.0)
MCHC: 32.4 g/dL (ref 30.0–36.0)
MCV: 82.8 fL (ref 78.0–100.0)
Monocytes Absolute: 0.8 10*3/uL (ref 0.1–1.0)
Monocytes Relative: 15 %
Neutro Abs: 3.1 10*3/uL (ref 1.7–7.7)
Neutrophils Relative %: 52 %
Platelets: 233 10*3/uL (ref 150–400)
RBC: 4.29 MIL/uL (ref 3.87–5.11)
RDW: 18 % — ABNORMAL HIGH (ref 11.5–15.5)
WBC: 5.8 10*3/uL (ref 4.0–10.5)

## 2016-05-30 LAB — BASIC METABOLIC PANEL
Anion gap: 7 (ref 5–15)
BUN: 16 mg/dL (ref 6–20)
CO2: 28 mmol/L (ref 22–32)
Calcium: 10.6 mg/dL — ABNORMAL HIGH (ref 8.9–10.3)
Chloride: 104 mmol/L (ref 101–111)
Creatinine, Ser: 1.18 mg/dL — ABNORMAL HIGH (ref 0.44–1.00)
GFR calc Af Amer: 51 mL/min — ABNORMAL LOW (ref >60)
GFR calc non Af Amer: 44 mL/min — ABNORMAL LOW (ref >60)
Glucose, Bld: 115 mg/dL — ABNORMAL HIGH (ref 65–99)
Potassium: 4.6 mmol/L (ref 3.5–5.1)
Sodium: 139 mmol/L (ref 135–145)

## 2016-05-30 LAB — MAGNESIUM: Magnesium: 1.9 mg/dL (ref 1.7–2.4)

## 2016-05-30 LAB — TSH: TSH: 3.302 u[IU]/mL (ref 0.350–4.500)

## 2016-05-30 LAB — BRAIN NATRIURETIC PEPTIDE: B Natriuretic Peptide: 864.9 pg/mL — ABNORMAL HIGH (ref 0.0–100.0)

## 2016-05-30 MED ORDER — FUROSEMIDE 10 MG/ML IJ SOLN
40.0000 mg | Freq: Once | INTRAMUSCULAR | Status: AC
  Administered 2016-05-30: 40 mg via INTRAVENOUS
  Filled 2016-05-30: qty 4

## 2016-05-30 MED ORDER — FUROSEMIDE 40 MG PO TABS: 40.0000 mg | ORAL_TABLET | Freq: Two times a day (BID) | ORAL | 0 refills | Status: DC

## 2016-05-30 NOTE — Progress Notes (Signed)
[name] 76 y.o.   Chief Complaint  Patient presents with  . Chest Pain    x 2-3 weeks (worse when laying down)    HISTORY OF PRESENT ILLNESS: This is a 76 y.o. female complaining chest pain and sob progressively getting worse over the past 2 weeks; midsternal pain that goes to her back and right arm; worse lying down; sob worse when she lies down; also c/o increased DOE.  HPI   Prior to Admission medications   Medication Sig Start Date End Date Taking? Authorizing Provider  alendronate (FOSAMAX) 70 MG tablet Take 1 tablet by mouth once a week. 03/24/16  Yes Historical Provider, MD  amiodarone (PACERONE) 200 MG tablet Take 1 tablet (200 mg total) by mouth daily. 05/13/16  Yes Mihai Croitoru, MD  apixaban (ELIQUIS) 5 MG TABS tablet Take 1 tablet (5 mg total) by mouth 2 (two) times daily. 03/12/16  Yes Mihai Croitoru, MD  Biotin 5000 MCG TABS Take by mouth.   Yes Historical Provider, MD  carvedilol (COREG) 6.25 MG tablet Take 1 tablet (6.25 mg total) by mouth 2 (two) times daily with a meal. Patient taking differently: Take 3.125 mg by mouth 2 (two) times daily with a meal.  03/12/16  Yes Mihai Croitoru, MD  cholecalciferol (VITAMIN D) 1000 UNITS tablet Take 1,000 Units by mouth at bedtime.    Yes Historical Provider, MD  citalopram (CELEXA) 20 MG tablet Take 0.5 tablets (10 mg total) by mouth daily. Patient taking differently: Take 10 mg by mouth every morning.  12/09/15  Yes Theodis Blaze, MD  Coenzyme Q10 (COQ10 PO) Take 120 mg by mouth at bedtime.    Yes Historical Provider, MD  cyanocobalamin 1000 MCG tablet Take 1 tablet by mouth daily.   Yes Historical Provider, MD  Flaxseed, Linseed, (FLAX SEED OIL) 1300 MG CAPS Take 1,400 mg by mouth daily.   Yes Historical Provider, MD  fluticasone (FLONASE) 50 MCG/ACT nasal spray Place 2 sprays into both nostrils daily. 02/18/16  Yes Elizabeth Whitney McVey, PA-C  levothyroxine (SYNTHROID, LEVOTHROID) 25 MCG tablet Take 25 mcg by mouth as  directed. 1 Tablet Monday-Thursday 2 x a day Fri Sat Sun 04/03/16  Yes Historical Provider, MD  lisinopril (PRINIVIL,ZESTRIL) 20 MG tablet Take 1 tablet (20 mg total) by mouth daily. 05/13/16  Yes Mihai Croitoru, MD  Multiple Vitamin (MULTIVITAMIN) capsule Take 1 capsule by mouth 2 (two) times daily.    Yes Historical Provider, MD  nitroGLYCERIN (NITROSTAT) 0.4 MG SL tablet Place 1 tablet (0.4 mg total) under the tongue every 5 (five) minutes x 3 doses as needed for chest pain. 05/15/15  Yes Brittainy Erie Noe, PA-C  Probiotic Product (PROBIOTIC PO) Take by mouth.   Yes Historical Provider, MD    Allergies  Allergen Reactions  . Brilinta [Ticagrelor] Shortness Of Breath  . Clonidine Derivatives Other (See Comments)    Lowers heart rate  . Atorvastatin Other (See Comments)    Possible cause of fatigue/malaise  . Crestor [Rosuvastatin Calcium] Other (See Comments)    Joint/muscle aches  . Exforge [Amlodipine Besylate-Valsartan] Itching and Rash  . Spironolactone Other (See Comments)    Contraindicated with history of hyperkalemia    Patient Active Problem List   Diagnosis Date Noted  . Hyperparathyroidism ([place]) 05/13/2016  . Pacemaker 01/10/2016  . Heme positive stool   . Benign neoplasm of ascending colon   . Chronic anticoagulation   . Gastrointestinal hemorrhage 12/17/2015  . Acute blood loss anemia 12/17/2015  .  CKD (chronic kidney disease), stage II   . Syncope 12/05/2015  . Hypotension 12/05/2015  . Hyperkalemia 12/04/2015  . H/O amiodarone therapy 12/04/2015  . Chronic diastolic heart failure ([place]) 10/19/2015  . Hypercalcemia 10/19/2015  . Atypical atrial flutter ([place])   . Paroxysmal atrial flutter ([place]) 08/23/2015  . Febrile illness, acute 06/27/2015  . Chronic fatigue 06/27/2015  . Stented coronary artery   . NSTEMI (non-ST elevated myocardial infarction) ([place]) 05/11/2015  . S/P CABG x 3 01/04/15 01/10/2015  . CAD (coronary artery disease) 01/04/2015  . Symptomatic  sinus bradycardia 01/22/2012  . PAF (paroxysmal atrial fibrillation) ([place]) 01/22/2012  . Sinus node dysfunction ([place]) 01/22/2012  . S/P placement of cardiac pacemaker, medtronic adapta 01/21/12 01/22/2012  . PVD (peripheral vascular disease), hx stents to bil SFAs 02/2010 01/22/2012  . Hyperlipidemia 12/31/2009  . Anxiety 12/31/2009  . DEPRESSION 12/31/2009  . Essential hypertension 12/31/2009  . Coronary atherosclerosis 12/31/2009  . OSTEOARTHRITIS, HAND 12/31/2009  . [place] DISEASE, CERVICAL 12/31/2009  . [place] DISEASE, LUMBAR 12/31/2009  . UNSPECIFIED URINARY INCONTINENCE 12/31/2009    Past Medical History:  Diagnosis Date  . ANXIETY 12/31/2009  . Aortic insufficiency    a. mod by echo 2017.  [place] CAD (coronary artery disease)    a. s/p CABGx3 and LAA clipping in 12/2014, NSTEMI 05/2015 s/p DES to native RCA and SVG-dRCA; occluded ramus-SVG was treated medically). c. neg [facility]uc 10/2015 [facility].  . Chronic diastolic CHF (congestive heart failure) ([place])   . Chronic fatigue   . CKD (chronic kidney disease), stage II   . DEPRESSION 12/31/2009  . [place] DISEASE, CERVICAL 12/31/2009  . [place] DISEASE, LUMBAR 12/31/2009  . DIVERTICULITIS, HX OF 12/31/2009  . Essential hypertension   . GLUCOSE INTOLERANCE 12/31/2009  . Habitual alcohol use   . Hypercalcemia   . Hyperkalemia   . Hyperlipidemia 12/31/2009  . MENOPAUSE, EARLY 12/31/2009  . NSTEMI (non-ST elevated myocardial infarction) ([place]) 05/11/2015  . Orthostatic hypotension   . OSTEOARTHRITIS, HAND   . Pacemaker 01/21/2012   MDT Adapta dual chamber  . Paroxysmal atrial flutter ([place])   . Persistent atrial fibrillation ([place])   . Pre-diabetes   . PVD (peripheral vascular disease), hx stents to bil SFAs 02/2010   . Symptomatic sinus bradycardia 01/22/2012  . Syncope    a. 11/2015 ? due to medications.  . Tachy-brady syndrome ([place])    a. s/p MDT PPM 2013.  . Tricuspid regurgitation     Past Surgical History:  Procedure Laterality Date  .  AUGMENTATION MAMMAPLASTY  1983  . CARDIAC CATHETERIZATION N/A 01/01/2015   Procedure: Left Heart Cath and Coronary Angiography;  Surgeon: Jettie Booze, MD;  Location: [place] CV LAB;  Service: Cardiovascular;  Laterality: N/A;  . CARDIAC CATHETERIZATION N/A 05/12/2015   Procedure: Left Heart Cath and Coronary Angiography;  Surgeon: Troy Sine, MD;  Location: [place] CV LAB;  Service: Cardiovascular;  Laterality: N/A;  . CARDIAC CATHETERIZATION N/A 05/12/2015   Procedure: Coronary Stent Intervention;  Surgeon: Troy Sine, MD;  Location: [place] CV LAB;  Service: Cardiovascular;  Laterality: N/A;  . CARDIOVERSION N/A 02/01/2015   Procedure: CARDIOVERSION;  Surgeon: Lelon Perla, MD;  Location: [facility] ENDOSCOPY;  Service: Cardiovascular;  Laterality: N/A;  . CARDIOVERSION N/A 08/30/2015   Procedure: CARDIOVERSION;  Surgeon: Sanda Klein, MD;  Location: [place] ENDOSCOPY;  Service: Cardiovascular;  Laterality: N/A;  . CARPAL TUNNEL RELEASE  2008   "right hand/thumb; carpal tunnel repair; got rid of arthritis" (  01/21/2012)  . CLIPPING OF ATRIAL APPENDAGE N/A 01/04/2015   Procedure: CLIPPING OF ATRIAL APPENDAGE;  Surgeon: Grace Isaac, MD;  Location: [place];  Service: Open Heart Surgery;  Laterality: N/A;  . COLONOSCOPY N/A 12/21/2015   Procedure: COLONOSCOPY;  Surgeon: Milus Banister, MD;  Location: [facility] ENDOSCOPY;  Service: Endoscopy;  Laterality: N/A;  . CORONARY ARTERY BYPASS GRAFT N/A 01/04/2015   Procedure: CORONARY ARTERY BYPASS GRAFTING (CABG) x 3 using left internal mammory artery and greater saphenous vein right leg harvested endoscopically.;  Surgeon: Grace Isaac, MD;  LIMA-LAD, SVG-RI, SVG-PDA  . ESOPHAGOGASTRODUODENOSCOPY (EGD) WITH PROPOFOL N/A 12/18/2015   Procedure: ESOPHAGOGASTRODUODENOSCOPY (EGD) WITH PROPOFOL;  Surgeon: Milus Banister, MD;  Location: [facility] ENDOSCOPY;  Service: Endoscopy;  Laterality: N/A;  . FACELIFT, LOWER 2/3  1995   "mini" (01/21/2012)   . GIVENS CAPSULE STUDY N/A 12/21/2015   Procedure: GIVENS CAPSULE STUDY;  Surgeon: Milus Banister, MD;  Location: [facility] ENDOSCOPY;  Service: Endoscopy;  Laterality: N/A;  . Lower Arterial Examination  10/28/2011   R. SFA stent mild-moderate mixed density plaque with elevated velocities consistent with 50% diameter reduction. L. SFA stent moderate mixed denisty plaque at mid to distal level consistent with 50-69% diameter reduction.  . OOPHORECTOMY  ~1979  . PARTIAL COLECTOMY  2010  . PERIPHERAL ARTERIAL STENT GRAFT  2012; 2012   "LLE; RLE" (01/21/2012)  . PERMANENT PACEMAKER INSERTION N/A 01/21/2012   Medtronic Adapta L implanted by Dr Sallyanne Kuster for tachy/brady syndrome  . POSTERIOR CERVICAL LAMINECTOMY  1985  . TEE WITHOUT CARDIOVERSION N/A 01/04/2015   Procedure: TRANSESOPHAGEAL ECHOCARDIOGRAM (TEE);  Surgeon: Grace Isaac, MD;  Location: [place];  Service: Open Heart Surgery;  Laterality: N/A;  . TEE WITHOUT CARDIOVERSION N/A 02/01/2015   Procedure: TRANSESOPHAGEAL ECHOCARDIOGRAM (TEE);  Surgeon: Lelon Perla, MD;  Location: [facility] ENDOSCOPY;  Service: Cardiovascular;  Laterality: N/A;  . TEE WITHOUT CARDIOVERSION N/A 08/30/2015   Procedure: TRANSESOPHAGEAL ECHOCARDIOGRAM (TEE);  Surgeon: Sanda Klein, MD;  Location: [place];  Service: Cardiovascular;  Laterality: N/A;  . [place] History   Social History  . Marital status: Widowed    Spouse name: N/A  . Number of children: 3  . Years of education: N/A   Occupational History  . Retired Chemical engineer Retired   Social History Main Topics  . Smoking status: Former Smoker    Packs/day: 0.75    Years: 10.00    Types: Cigarettes  . Smokeless tobacco: Never Used     Comment: 01/21/2012 "quit smoking ~ 2002"  . Alcohol use Yes     Comment: 01/21/2012 "3 times/wk I have a couple mixed drinks"; 06/28/2015 "nothing in the last 3 weeks cause I haven't felt good", reduced recently  . Drug use: No  .  Sexual activity: Not Currently   Other Topics Concern  . Not on file   Social History Narrative   Lives in [name].  Retired.    Family History  Problem Relation Age of Onset  . Heart disease Mother   . Hypertension Mother   . Stroke Mother   . Stroke Father   . Heart disease Father   . Heart disease Brother   . Hypertension Brother     2 brothers  . CVA Brother     2 brothers  . Heart attack Brother     2 brothers     Review of Systems  Constitutional: Negative for chills and fever.  HENT: Negative.   Eyes: Negative.   Respiratory: Positive for shortness of breath.   Cardiovascular: Positive for chest pain, orthopnea and PND.       +PPM left upper chest +cardiotomy scar  Gastrointestinal: Negative for abdominal pain, diarrhea, nausea and vomiting.  Genitourinary: Negative for dysuria and hematuria.  Musculoskeletal: Negative for back pain, myalgias and neck pain.  Skin: Negative for rash.  Neurological: Positive for weakness. Negative for dizziness and headaches.  Endo/Heme/Allergies: Negative.   All other systems reviewed and are negative.  Vitals:   05/30/16 1543  BP: (!) 159/95  Pulse: 87  Resp: 17  Temp: 97.8 F (36.6 C)   EKG: pacer rhythm  Physical Exam  Constitutional: She is oriented to person, place, and time. She appears well-developed and well-nourished.  HENT:  Head: Normocephalic and atraumatic.  Nose: Nose normal.  Mouth/Throat: Oropharynx is clear and moist.  Eyes: Conjunctivae and EOM are normal. Pupils are equal, round, and reactive to light.  Neck: Normal range of motion. Neck supple. No JVD present. No thyromegaly present.  Cardiovascular: Normal rate, normal heart sounds and intact distal pulses.   +extrasystoles  Pulmonary/Chest: Effort normal. She has no wheezes. She has rales (bases L>R).  Abdominal: Soft. There is no tenderness.  Musculoskeletal: Normal range of motion.  Lymphadenopathy:    She has no cervical adenopathy.   Neurological: She is alert and oriented to person, place, and time. No sensory deficit. She exhibits normal muscle tone.  Skin: Skin is warm and dry. Capillary refill takes less than 2 seconds.  Psychiatric: She has a normal mood and affect. Her behavior is normal.  Vitals reviewed.    ASSESSMENT & PLAN: [name] was seen today for chest pain.  Diagnoses and all orders for this visit:  Chest pain, unspecified type -     EKG 12-Lead  SOB (shortness of breath)  Chronic diastolic heart failure (HCC)  Valvular heart disease  Coronary artery disease involving native heart with angina pectoris, unspecified vessel or lesion type [facility])   Patient Instructions    To ER now for further evaluation and possible admission.   IF you received an x-ray today, you will receive an invoice from [facility]. Please contact [facility] at [phone] with questions or concerns regarding your invoice.   IF you received labwork today, you will receive an invoice from [facility]. Please contact [facility] at [phone] with questions or concerns regarding your invoice.   Our billing staff will not be able to assist you with questions regarding bills from these companies.  You will be contacted with the lab results as soon as they are available. The fastest way to get your results is to activate your My Chart account. Instructions are located on the last page of this paperwork. If you have not heard from [place] regarding the results in 2 weeks, please contact this office.          Agustina Caroli, MD [facility]

## 2016-05-30 NOTE — ED Notes (Signed)
ED Provider at bedside. 

## 2016-05-30 NOTE — ED Provider Notes (Signed)
[place] DEPT Provider Note   CSN: [id number] Arrival date & time: 05/30/16  1648     History   Chief Complaint Chief Complaint  Patient presents with  . Shortness of Breath    HPI [name] is a 76 y.o. female.  The history is provided by the patient.  Shortness of Breath  This is a new problem. The average episode lasts 3 weeks. The problem occurs continuously.The current episode started more than 1 week ago. The problem has been gradually worsening. Associated symptoms include cough (occasional), orthopnea (worse at night for 2-3 weeks), chest pain (most recently last night and several nights ago) and leg swelling. Associated symptoms comments: Facial swelling, 10 lb weight gain. She has tried nothing for the symptoms. She has had prior hospitalizations. She has had prior ED visits. Associated medical issues include CAD (severe s/p stenting 1 y/a) and past MI.    Past Medical History:  Diagnosis Date  . ANXIETY 12/31/2009  . Aortic insufficiency    a. mod by echo 2017.  Marland Kitchen CAD (coronary artery disease)    a. s/p CABGx3 and LAA clipping in 12/2014, NSTEMI 05/2015 s/p DES to native RCA and SVG-dRCA; occluded ramus-SVG was treated medically). c. neg nuc 10/2015 at [facility].  . Chronic diastolic CHF (congestive heart failure) ([place])   . Chronic fatigue   . CKD (chronic kidney disease), stage II   . DEPRESSION 12/31/2009  . [place] DISEASE, CERVICAL 12/31/2009  . [place] DISEASE, LUMBAR 12/31/2009  . DIVERTICULITIS, HX OF 12/31/2009  . Essential hypertension   . GLUCOSE INTOLERANCE 12/31/2009  . Habitual alcohol use   . Hypercalcemia   . Hyperkalemia   . Hyperlipidemia 12/31/2009  . MENOPAUSE, EARLY 12/31/2009  . NSTEMI (non-ST elevated myocardial infarction) ([place]) 05/11/2015  . Orthostatic hypotension   . OSTEOARTHRITIS, HAND   . Pacemaker 01/21/2012   MDT Adapta dual chamber  . Paroxysmal atrial flutter ([place])   . Persistent atrial fibrillation ([place])   . Pre-diabetes   .  PVD (peripheral vascular disease), hx stents to bil SFAs 02/2010   . Symptomatic sinus bradycardia 01/22/2012  . Syncope    a. 11/2015 ? due to medications.  . Tachy-brady syndrome ([place])    a. s/p MDT PPM 2013.  . Tricuspid regurgitation     Patient Active Problem List   Diagnosis Date Noted  . Hyperparathyroidism ([place]) 05/13/2016  . Pacemaker 01/10/2016  . Heme positive stool   . Benign neoplasm of ascending colon   . Chronic anticoagulation   . Gastrointestinal hemorrhage 12/17/2015  . Acute blood loss anemia 12/17/2015  . CKD (chronic kidney disease), stage II   . Syncope 12/05/2015  . Hypotension 12/05/2015  . Hyperkalemia 12/04/2015  . H/O amiodarone therapy 12/04/2015  . Chronic diastolic heart failure ([place]) 10/19/2015  . Hypercalcemia 10/19/2015  . Atypical atrial flutter ([place])   . Paroxysmal atrial flutter ([place]) 08/23/2015  . Febrile illness, acute 06/27/2015  . Chronic fatigue 06/27/2015  . Stented coronary artery   . NSTEMI (non-ST elevated myocardial infarction) ([place]) 05/11/2015  . S/P CABG x 3 01/04/15 01/10/2015  . CAD (coronary artery disease) 01/04/2015  . Symptomatic sinus bradycardia 01/22/2012  . PAF (paroxysmal atrial fibrillation) ([place]) 01/22/2012  . Sinus node dysfunction ([place]) 01/22/2012  . S/P placement of cardiac pacemaker, medtronic adapta 01/21/12 01/22/2012  . PVD (peripheral vascular disease), hx stents to bil SFAs 02/2010 01/22/2012  . Hyperlipidemia 12/31/2009  . Anxiety 12/31/2009  . DEPRESSION 12/31/2009  . Essential hypertension 12/31/2009  .  Coronary atherosclerosis 12/31/2009  . OSTEOARTHRITIS, HAND 12/31/2009  . [place] DISEASE, CERVICAL 12/31/2009  . [place] DISEASE, LUMBAR 12/31/2009  . UNSPECIFIED URINARY INCONTINENCE 12/31/2009    Past Surgical History:  Procedure Laterality Date  . AUGMENTATION MAMMAPLASTY  1983  . CARDIAC CATHETERIZATION N/A 01/01/2015   Procedure: Left Heart Cath and Coronary Angiography;  Surgeon: Jettie Booze, MD;  Location: [place] CV LAB;  Service: Cardiovascular;  Laterality: N/A;  . CARDIAC CATHETERIZATION N/A 05/12/2015   Procedure: Left Heart Cath and Coronary Angiography;  Surgeon: Troy Sine, MD;  Location: [place] CV LAB;  Service: Cardiovascular;  Laterality: N/A;  . CARDIAC CATHETERIZATION N/A 05/12/2015   Procedure: Coronary Stent Intervention;  Surgeon: Troy Sine, MD;  Location: [place] CV LAB;  Service: Cardiovascular;  Laterality: N/A;  . CARDIOVERSION N/A 02/01/2015   Procedure: CARDIOVERSION;  Surgeon: Lelon Perla, MD;  Location: [facility] ENDOSCOPY;  Service: Cardiovascular;  Laterality: N/A;  . CARDIOVERSION N/A 08/30/2015   Procedure: CARDIOVERSION;  Surgeon: Sanda Klein, MD;  Location: [place] ENDOSCOPY;  Service: Cardiovascular;  Laterality: N/A;  . CARPAL TUNNEL RELEASE  2008   "right hand/thumb; carpal tunnel repair; got rid of arthritis" (01/21/2012)  . CLIPPING OF ATRIAL APPENDAGE N/A 01/04/2015   Procedure: CLIPPING OF ATRIAL APPENDAGE;  Surgeon: Grace Isaac, MD;  Location: [place];  Service: Open Heart Surgery;  Laterality: N/A;  . COLONOSCOPY N/A 12/21/2015   Procedure: COLONOSCOPY;  Surgeon: Milus Banister, MD;  Location: [facility] ENDOSCOPY;  Service: Endoscopy;  Laterality: N/A;  . CORONARY ARTERY BYPASS GRAFT N/A 01/04/2015   Procedure: CORONARY ARTERY BYPASS GRAFTING (CABG) x 3 using left internal mammory artery and greater saphenous vein right leg harvested endoscopically.;  Surgeon: Grace Isaac, MD;  LIMA-LAD, SVG-RI, SVG-PDA  . ESOPHAGOGASTRODUODENOSCOPY (EGD) WITH PROPOFOL N/A 12/18/2015   Procedure: ESOPHAGOGASTRODUODENOSCOPY (EGD) WITH PROPOFOL;  Surgeon: Milus Banister, MD;  Location: [facility] ENDOSCOPY;  Service: Endoscopy;  Laterality: N/A;  . FACELIFT, LOWER 2/3  1995   "mini" (01/21/2012)  . GIVENS CAPSULE STUDY N/A 12/21/2015   Procedure: GIVENS CAPSULE STUDY;  Surgeon: Milus Banister, MD;  Location: [facility] ENDOSCOPY;  Service: Endoscopy;   Laterality: N/A;  . Lower Arterial Examination  10/28/2011   R. SFA stent mild-moderate mixed density plaque with elevated velocities consistent with 50% diameter reduction. L. SFA stent moderate mixed denisty plaque at mid to distal level consistent with 50-69% diameter reduction.  . OOPHORECTOMY  ~1979  . PARTIAL COLECTOMY  2010  . PERIPHERAL ARTERIAL STENT GRAFT  2012; 2012   "LLE; RLE" (01/21/2012)  . PERMANENT PACEMAKER INSERTION N/A 01/21/2012   Medtronic Adapta L implanted by Dr Sallyanne Kuster for tachy/brady syndrome  . POSTERIOR CERVICAL LAMINECTOMY  1985  . TEE WITHOUT CARDIOVERSION N/A 01/04/2015   Procedure: TRANSESOPHAGEAL ECHOCARDIOGRAM (TEE);  Surgeon: Grace Isaac, MD;  Location: [place];  Service: Open Heart Surgery;  Laterality: N/A;  . TEE WITHOUT CARDIOVERSION N/A 02/01/2015   Procedure: TRANSESOPHAGEAL ECHOCARDIOGRAM (TEE);  Surgeon: Lelon Perla, MD;  Location: [facility] ENDOSCOPY;  Service: Cardiovascular;  Laterality: N/A;  . TEE WITHOUT CARDIOVERSION N/A 08/30/2015   Procedure: TRANSESOPHAGEAL ECHOCARDIOGRAM (TEE);  Surgeon: Sanda Klein, MD;  Location: [facility] ENDOSCOPY;  Service: Cardiovascular;  Laterality: N/A;  . [place]    OB History    No data available       Home Medications    Prior to Admission medications   Medication Sig Start Date End Date Taking? Authorizing  Provider  amiodarone (PACERONE) 200 MG tablet Take 1 tablet (200 mg total) by mouth daily. 05/13/16  Yes Mihai Croitoru, MD  apixaban (ELIQUIS) 5 MG TABS tablet Take 1 tablet (5 mg total) by mouth 2 (two) times daily. 03/12/16  Yes Mihai Croitoru, MD  carvedilol (COREG) 6.25 MG tablet Take 1 tablet (6.25 mg total) by mouth 2 (two) times daily with a meal. 03/12/16  Yes Mihai Croitoru, MD  citalopram (CELEXA) 20 MG tablet Take 0.5 tablets (10 mg total) by mouth daily. Patient taking differently: Take 10 mg by mouth every morning.  12/09/15  Yes Theodis Blaze, MD  alendronate (FOSAMAX) 70  MG tablet Take 1 tablet by mouth once a week. 03/24/16   Historical Provider, MD  Biotin 5000 MCG TABS Take by mouth.    Historical Provider, MD  cholecalciferol (VITAMIN D) 1000 UNITS tablet Take 1,000 Units by mouth at bedtime.     Historical Provider, MD  Coenzyme Q10 (COQ10 PO) Take 120 mg by mouth at bedtime.     Historical Provider, MD  cyanocobalamin 1000 MCG tablet Take 1 tablet by mouth daily.    Historical Provider, MD  Flaxseed, Linseed, (FLAX SEED OIL) 1300 MG CAPS Take 1,400 mg by mouth daily.    Historical Provider, MD  fluticasone (FLONASE) 50 MCG/ACT nasal spray Place 2 sprays into both nostrils daily. Patient not taking: Reported on 05/30/2016 02/18/16   Gelene Mink McVey, PA-C  levothyroxine (SYNTHROID, LEVOTHROID) 25 MCG tablet Take 25 mcg by mouth as directed. 1 Tablet Monday-Thursday 2 x a day Fri Sat Sun 04/03/16   Historical Provider, MD  lisinopril (PRINIVIL,ZESTRIL) 20 MG tablet Take 1 tablet (20 mg total) by mouth daily. 05/13/16   Mihai Croitoru, MD  Multiple Vitamin (MULTIVITAMIN) capsule Take 1 capsule by mouth 2 (two) times daily.     Historical Provider, MD  nitroGLYCERIN (NITROSTAT) 0.4 MG SL tablet Place 1 tablet (0.4 mg total) under the tongue every 5 (five) minutes x 3 doses as needed for chest pain. 05/15/15   Brittainy Erie Noe, PA-C  omeprazole (PRILOSEC) 40 MG capsule Take 40 mg by mouth daily. 05/19/16   Historical Provider, MD  Probiotic Product (PROBIOTIC PO) Take by mouth.    Historical Provider, MD    Family History Family History  Problem Relation Age of Onset  . Heart disease Mother   . Hypertension Mother   . Stroke Mother   . Stroke Father   . Heart disease Father   . Heart disease Brother   . Hypertension Brother     2 brothers  . CVA Brother     2 brothers  . Heart attack Brother     2 brothers    Social History Social History  Substance Use Topics  . Smoking status: Former Smoker    Packs/day: 0.75    Years: 10.00    Types:  Cigarettes  . Smokeless tobacco: Never Used     Comment: 01/21/2012 "quit smoking ~ 2002"  . Alcohol use Yes     Comment: 01/21/2012 "3 times/wk I have a couple mixed drinks"; 06/28/2015 "nothing in the last 3 weeks cause I haven't felt good", reduced recently     Allergies   Brilinta [ticagrelor]; Clonidine derivatives; Atorvastatin; Crestor [rosuvastatin calcium]; Exforge [amlodipine besylate-valsartan]; and Spironolactone   Review of Systems Review of Systems  Respiratory: Positive for cough (occasional) and shortness of breath.   Cardiovascular: Positive for chest pain (most recently last night and several nights ago), orthopnea (worse  at night for 2-3 weeks) and leg swelling.  All other systems reviewed and are negative.    Physical Exam Updated Vital Signs BP (!) 149/93   Pulse (!) 59   Temp 98 F (36.7 C) (Oral)   Resp 16   SpO2 95%   Physical Exam  Constitutional: She is oriented to person, place, and time. She appears well-developed and well-nourished. No distress.  HENT:  Head: Normocephalic.  Nose: Nose normal.  Eyes: Conjunctivae are normal.  Neck: Neck supple. No tracheal deviation present.  Cardiovascular: Normal rate, regular rhythm and normal heart sounds.   Pulmonary/Chest: Effort normal and breath sounds normal. No respiratory distress. She has no wheezes. She has no rales.  Abdominal: Soft. She exhibits no distension. There is no tenderness.  Neurological: She is alert and oriented to person, place, and time.  Skin: Skin is warm and dry. Capillary refill takes less than 2 seconds.  No edema notable  Psychiatric: She has a normal mood and affect.  Vitals reviewed.    ED Treatments / Results  Labs (all labs ordered are listed, but only abnormal results are displayed) Labs Reviewed  BASIC METABOLIC PANEL - Abnormal; Notable for the following:       Result Value   Glucose, Bld 115 (*)    Creatinine, Ser 1.18 (*)    Calcium 10.6 (*)    GFR calc non  Af Amer 44 (*)    GFR calc Af Amer 51 (*)    All other components within normal limits  BRAIN NATRIURETIC PEPTIDE - Abnormal; Notable for the following:    B Natriuretic Peptide 864.9 (*)    All other components within normal limits  CBC WITH DIFFERENTIAL/PLATELET - Abnormal; Notable for the following:    Hemoglobin 11.5 (*)    HCT 35.5 (*)    RDW 18.0 (*)    All other components within normal limits  MAGNESIUM  TSH  I-STAT TROPOININ, ED    EKG  EKG Interpretation  Date/Time:  Saturday May 30 2016 16:56:19 EDT Ventricular Rate:  79 PR Interval:    QRS Duration: 182 QT Interval:  498 QTC Calculation: 568 R Axis:   -80 Text Interpretation:  A-V dual-paced complexes w/ some inhibition No further analysis attempted due to paced rhythm Baseline wander in lead(s) V2 Ventricular pacing new since previous Confirmed by Katy Brickell MD, Aubrei Bouchie [phone]) on 05/30/2016 5:02:27 PM       Radiology Dg Chest 2 View  Result Date: 05/30/2016 CLINICAL DATA:  Shortness of breath and chest pain EXAM: CHEST  2 VIEW COMPARISON:  Chest radiograph 02/18/2016 FINDINGS: Dual lead left chest wall pacemaker is unchanged. There is a left atrial appendage clip, also unchanged. Cardiomegaly is unchanged. No pulmonary edema or focal consolidation. No pneumothorax or pleural effusion. IMPRESSION: No active cardiopulmonary disease. Electronically Signed   By: Ulyses Jarred M.D.   On: 05/30/2016 17:42    Procedures Procedures (including critical care time)  Medications Ordered in ED Medications  furosemide (LASIX) injection 40 mg (not administered)     Initial Impression / Assessment and Plan / ED Course  I have reviewed the triage vital signs and the nursing notes.  Pertinent labs & imaging results that were available during my care of the patient were reviewed by me and considered in my medical decision making (see chart for details).     76 y.o. female presents with Progressive shortness of breath over  the last 3 weeks. She has experienced a 10 pound weight  gain. She does have some diastolic heart failure with history of moderate aortic regurgitation although this is not evident on physical exam today. She has no significant edema on x-ray or on physical exam. She does have a modest elevation of her BNP from baseline suggesting an element of congestive heart failure. She has underlying CAD and has had intermittent chest pain including last night but none today, no active chest pain here with negative troponin and EKG showing only paced rhythm at a normal rate. She saw her cardiologist last week and discussed the symptoms with him and had plan for watchful waiting.  Doubt ACS given lack of active symptoms and progression over the last few weeks with negative troponin. Plan will be for diuresis and follow-up as an outpatient with cardiology to monitor progression of symptoms.  Final Clinical Impressions(s) / ED Diagnoses   Final diagnoses:  SOB (shortness of breath)  Acute on chronic diastolic congestive heart failure (HCC)    New Prescriptions New Prescriptions   FUROSEMIDE (LASIX) 40 MG TABLET    Take 1 tablet (40 mg total) by mouth 2 (two) times daily.     Leo Grosser, MD 05/30/16 [phone]

## 2016-05-30 NOTE — ED Triage Notes (Signed)
Pt reports SOB that is worse at night for the past several weeks. Pt went to UC for this because she has had trouble sleeping. Accompanied by CP and lightheaded.

## 2016-05-30 NOTE — Patient Instructions (Addendum)
  To ER now for further evaluation and possible admission.   IF you received an x-ray today, you will receive an invoice from [facility] Radiology. Please contact [facility] Radiology at [phone] with questions or concerns regarding your invoice.   IF you received labwork today, you will receive an invoice from [facility]. Please contact [facility] at [phone] with questions or concerns regarding your invoice.   Our billing staff will not be able to assist you with questions regarding bills from these companies.  You will be contacted with the lab results as soon as they are available. The fastest way to get your results is to activate your My Chart account. Instructions are located on the last page of this paperwork. If you have not heard from [place] regarding the results in 2 weeks, please contact this office.

## 2016-06-01 NOTE — Telephone Encounter (Signed)
[name]'s reponse to patient's emails from 05/25/16. Please stop her amiodarone and make her an appointment in 6-8 weeks. Please reaffirm that she should continue taking carvedilol. I offered a consultation with EP. Please go ahead and make the referral if she agrees  [employer]

## 2016-06-02 NOTE — Telephone Encounter (Signed)
Patient returned call. Recommendations reviewed. Appointment made for 07/16/16 at 10:00a. Offered EP referral.Patient verbalized understanding and agreed with plan. Patient would like to research our EP physicians before making any decisions on referral. Names given for our EP MDs. Patient will either call or wait until appt to proceed with referral.

## 2016-06-08 ENCOUNTER — Telehealth: Payer: Self-pay | Admitting: Cardiovascular Disease

## 2016-06-08 NOTE — Telephone Encounter (Signed)
Close encounter 

## 2016-06-15 ENCOUNTER — Telehealth: Payer: Self-pay | Admitting: Cardiovascular Disease

## 2016-06-15 NOTE — Telephone Encounter (Signed)
Neil Crouch C routed conversation to [employer] Refill 9 minutes ago (1:45 PM)    Neil Crouch C 10 minutes ago (1:45 PM)      New message    Patient calling the office for samples of medication:   1. What medication and dosage are you requesting samples for?apixaban (ELIQUIS) 5 MG TABS tablet  2. Are you currentlyout of this medication? Only enough for a week.         PT STATED THAT [facility] HAS BEEN SUPPLYING HER WITH SAMPLES DUE TO COST, WILL SEND TO [facility] AS WE WILL NOT SUPPORT A MONTHLY SAMPLE SUPPLY FOR PT'S

## 2016-06-15 NOTE — Telephone Encounter (Signed)
New message    Patient calling the office for samples of medication:   1.  What medication and dosage are you requesting samples for?apixaban (ELIQUIS) 5 MG TABS tablet  2.  Are you currently out of this medication? Only enough for a week.

## 2016-06-15 NOTE — Telephone Encounter (Signed)
Returned call to patient.Eliquis 5 mg samples left at front desk of [facility] office. 

## 2016-06-17 ENCOUNTER — Encounter: Payer: Self-pay | Admitting: Cardiovascular Disease

## 2016-06-17 ENCOUNTER — Telehealth: Payer: Self-pay

## 2016-06-17 ENCOUNTER — Other Ambulatory Visit: Payer: Self-pay | Admitting: Surgery

## 2016-06-17 DIAGNOSIS — K8020 Calculus of gallbladder without cholecystitis without obstruction: Secondary | ICD-10-CM | POA: Diagnosis not present

## 2016-06-17 NOTE — Telephone Encounter (Signed)
epicd 

## 2016-06-17 NOTE — Telephone Encounter (Signed)
1. Type of surgery: laparoscopic cholescystectomy 2. Date of surgery: pending 3. Surgeon: Dr Coralie Keens 4. Medications that need to be held & how long: Eliquis 5 mg; 5 days 5. Fax and/or Phone: (p) [phone] (f) [phone]

## 2016-06-23 ENCOUNTER — Ambulatory Visit (INDEPENDENT_AMBULATORY_CARE_PROVIDER_SITE_OTHER): Payer: Medicare HMO | Admitting: Cardiology

## 2016-06-23 ENCOUNTER — Encounter: Payer: Self-pay | Admitting: Cardiology

## 2016-06-23 VITALS — BP 140/80 | HR 98 | Ht 65.0 in | Wt 141.0 lb

## 2016-06-23 DIAGNOSIS — I25810 Atherosclerosis of coronary artery bypass graft(s) without angina pectoris: Secondary | ICD-10-CM

## 2016-06-23 DIAGNOSIS — I495 Sick sinus syndrome: Secondary | ICD-10-CM

## 2016-06-23 DIAGNOSIS — I10 Essential (primary) hypertension: Secondary | ICD-10-CM | POA: Diagnosis not present

## 2016-06-23 DIAGNOSIS — I483 Typical atrial flutter: Secondary | ICD-10-CM

## 2016-06-23 DIAGNOSIS — I4819 Other persistent atrial fibrillation: Secondary | ICD-10-CM

## 2016-06-23 DIAGNOSIS — I481 Persistent atrial fibrillation: Secondary | ICD-10-CM | POA: Diagnosis not present

## 2016-06-23 LAB — CUP PACEART INCLINIC DEVICE CHECK
Battery Impedance: 302 Ohm
Battery Remaining Longevity: 108 mo
Battery Voltage: 2.78 V
Brady Statistic AP VP Percent: 2 %
Brady Statistic AP VS Percent: 1 %
Brady Statistic AS VP Percent: 11 %
Brady Statistic AS VS Percent: 86 %
Date Time Interrogation Session: 20180515160450
Implantable Lead Implant Date: 20131212
Implantable Lead Implant Date: 20131212
Implantable Lead Location: 753859
Implantable Lead Location: 753860
Implantable Lead Model: 5076
Implantable Lead Model: 5076
Implantable Pulse Generator Implant Date: 20131212
Lead Channel Impedance Value: 378 Ohm
Lead Channel Impedance Value: 423 Ohm
Lead Channel Pacing Threshold Amplitude: 0.75 V
Lead Channel Pacing Threshold Amplitude: 0.75 V
Lead Channel Pacing Threshold Pulse Width: 0.4 ms
Lead Channel Pacing Threshold Pulse Width: 0.4 ms
Lead Channel Sensing Intrinsic Amplitude: 0.7 mV
Lead Channel Sensing Intrinsic Amplitude: 11.2 mV
Lead Channel Setting Pacing Amplitude: 2 V
Lead Channel Setting Pacing Amplitude: 2.5 V
Lead Channel Setting Pacing Pulse Width: 0.4 ms
Lead Channel Setting Sensing Sensitivity: 4 mV

## 2016-06-23 NOTE — Patient Instructions (Signed)
Medication Instructions:    Your physician recommends that you continue on your current medications as directed. Please refer to the Current Medication list given to you today.  Labwork:  None ordered  Testing/Procedures:  None ordered  Follow-Up:  Your physician recommends that you schedule a follow-up appointment in: 3 months with Dr. Curt Bears.  - If you need a refill on your cardiac medications before your next appointment, please call your pharmacy.    Thank you for choosing [facility]!!   Trinidad Curet, RN [phone]   Any Other Special Instructions Will Be Listed Below (If Applicable).   Cardiac Ablation Cardiac ablation is a procedure to disable (ablate) a small amount of heart tissue in very specific places. The heart has many electrical connections. Sometimes these connections are abnormal and can cause the heart to beat very fast or irregularly. Ablating some of the problem areas can improve the heart rhythm or return it to normal. Ablation may be done for people who:  Have Wolff-Parkinson-White syndrome.  Have fast heart rhythms (tachycardia).  Have taken medicines for an abnormal heart rhythm (arrhythmia) that were not effective or caused side effects.  Have a high-risk heartbeat that may be life-threatening. During the procedure, a small incision is made in the neck or the groin, and a long, thin, flexible tube (catheter) is inserted into the incision and moved to the heart. Small devices (electrodes) on the tip of the catheter will send out electrical currents. A type of X-ray (fluoroscopy) will be used to help guide the catheter and to provide images of the heart. Tell a health care provider about:  Any allergies you have.  All medicines you are taking, including vitamins, herbs, eye drops, creams, and over-the-counter medicines.  Any problems you or family members have had with anesthetic medicines.  Any blood disorders you have.  Any surgeries  you have had.  Any medical conditions you have, such as kidney failure.  Whether you are pregnant or may be pregnant. What are the risks? Generally, this is a safe procedure. However, problems may occur, including:  Infection.  Bruising and bleeding at the catheter insertion site.  Bleeding into the chest, especially into the sac that surrounds the heart. This is a serious complication.  Stroke or blood clots.  Damage to other structures or organs.  Allergic reaction to medicines or dyes.  Need for a permanent pacemaker if the normal electrical system is damaged. A pacemaker is a small computer that sends electrical signals to the heart and helps your heart beat normally.  The procedure not being fully effective. This may not be recognized until months later. Repeat ablation procedures are sometimes required. What happens before the procedure?  Follow instructions from your health care provider about eating or drinking restrictions.  Ask your health care provider about:  Changing or stopping your regular medicines. This is especially important if you are taking diabetes medicines or blood thinners.  Taking medicines such as aspirin and ibuprofen. These medicines can thin your blood. Do not take these medicines before your procedure if your health care provider instructs you not to.  Plan to have someone take you home from the hospital or clinic.  If you will be going home right after the procedure, plan to have someone with you for 24 hours. What happens during the procedure?  To lower your risk of infection:  Your health care team will wash or sanitize their hands.  Your skin will be washed with soap.  Hair  may be removed from the incision area.  An IV tube will be inserted into one of your veins.  You will be given a medicine to help you relax (sedative).  The skin on your neck or groin will be numbed.  An incision will be made in your neck or your groin.  A  needle will be inserted through the incision and into a large vein in your neck or groin.  A catheter will be inserted into the needle and moved to your heart.  Dye may be injected through the catheter to help your surgeon see the area of the heart that needs treatment.  Electrical currents will be sent from the catheter to ablate heart tissue in desired areas. There are three types of energy that may be used to ablate heart tissue:  Heat (radiofrequency energy).  Laser energy.  Extreme cold (cryoablation).  When the necessary tissue has been ablated, the catheter will be removed.  Pressure will be held on the catheter insertion area to prevent excessive bleeding.  A bandage (dressing) will be placed over the catheter insertion area. The procedure may vary among health care providers and hospitals. What happens after the procedure?  Your blood pressure, heart rate, breathing rate, and blood oxygen level will be monitored until the medicines you were given have worn off.  Your catheter insertion area will be monitored for bleeding. You will need to lie still for a few hours to ensure that you do not bleed from the catheter insertion area.  Do not drive for 24 hours or as long as directed by your health care provider. Summary  Cardiac ablation is a procedure to disable (ablate) a small amount of heart tissue in very specific places. Ablating some of the problem areas can improve the heart rhythm or return it to normal.  During the procedure, electrical currents will be sent from the catheter to ablate heart tissue in desired areas. This information is not intended to replace advice given to you by your health care provider. Make sure you discuss any questions you have with your health care provider. Document Released: 06/14/2008 Document Revised: 12/16/2015 Document Reviewed: 12/16/2015 Elsevier Interactive Patient Education  2017 [employer].

## 2016-06-23 NOTE — Progress Notes (Signed)
Electrophysiology Office Note   Date:  06/23/2016   ID:  [name], DOB 1940/03/24, MRN [id number]  PCP:  Kathyrn Lass, MD  Cardiologist:  [place] Primary Electrophysiologist:  Bliss Behnke Meredith Leeds, MD    Chief Complaint  Patient presents with  . Pacemaker Check    Persistent Afib     History of Present Illness: [name] is a 76 y.o. female who is being seen today for the evaluation of atrial fibrillation at the request of Kathyrn Lass, MD. Presenting today for electrophysiology evaluation. She has a history of coronary artery disease status post recent bypass surgery in 2016, post non-ST elevation MI in April 2017 requiring stents in the right coronary and saphenous vein to the distal right coronary artery due to early graft dysfunction, as well as paroxysmal atrial fibrillation. She takes Eliquis for her anticoagulation. She does have a pacemaker in place. Her atrial arrhythmias have increased since January. Her average ventricular rate was 100 bpm. Her overall burden has been roughly 13%. She presents to clinic today in atrial fibrillation per device interrogation. She is complaining of fatigue and weakness mainly in her legs when she exerts herself. She does not note palpitations or shortness of breath.    Today, she denies symptoms of palpitations, chest pain, shortness of breath, orthopnea, PND, lower extremity edema, claudication, dizziness, presyncope, syncope, bleeding, or neurologic sequela. The patient is tolerating medications without difficulties.    Past Medical History:  Diagnosis Date  . ANXIETY 12/31/2009  . Aortic insufficiency    a. mod by echo 2017.  Marland Kitchen CAD (coronary artery disease)    a. s/p CABGx3 and LAA clipping in 12/2014, NSTEMI 05/2015 s/p DES to native RCA and SVG-dRCA; occluded ramus-SVG was treated medically). c. neg nuc 10/2015 at [facility]  . Chronic diastolic CHF (congestive heart failure) ([place])   . Chronic fatigue   . CKD (chronic kidney disease),  stage II   . DEPRESSION 12/31/2009  . [place] DISEASE, CERVICAL 12/31/2009  . [place] DISEASE, LUMBAR 12/31/2009  . DIVERTICULITIS, HX OF 12/31/2009  . Essential hypertension   . GLUCOSE INTOLERANCE 12/31/2009  . Habitual alcohol use   . Hypercalcemia   . Hyperkalemia   . Hyperlipidemia 12/31/2009  . MENOPAUSE, EARLY 12/31/2009  . NSTEMI (non-ST elevated myocardial infarction) ([place]) 05/11/2015  . Orthostatic hypotension   . OSTEOARTHRITIS, HAND   . Pacemaker 01/21/2012   MDT Adapta dual chamber  . Paroxysmal atrial flutter ([place])   . Persistent atrial fibrillation ([place])   . Pre-diabetes   . PVD (peripheral vascular disease), hx stents to bil SFAs 02/2010   . Symptomatic sinus bradycardia 01/22/2012  . Syncope    a. 11/2015 ? due to medications.  . Tachy-brady syndrome ([place])    a. s/p MDT PPM 2013.  . Tricuspid regurgitation    Past Surgical History:  Procedure Laterality Date  . AUGMENTATION MAMMAPLASTY  1983  . CARDIAC CATHETERIZATION N/A 01/01/2015   Procedure: Left Heart Cath and Coronary Angiography;  Surgeon: Jettie Booze, MD;  Location: [place] [facility] LAB;  Service: Cardiovascular;  Laterality: N/A;  . CARDIAC CATHETERIZATION N/A 05/12/2015   Procedure: Left Heart Cath and Coronary Angiography;  Surgeon: Troy Sine, MD;  Location: [place] [facility] LAB;  Service: Cardiovascular;  Laterality: N/A;  . CARDIAC CATHETERIZATION N/A 05/12/2015   Procedure: Coronary Stent Intervention;  Surgeon: Troy Sine, MD;  Location: [place] CV LAB;  Service: Cardiovascular;  Laterality: N/A;  . CARDIOVERSION N/A 02/01/2015  Procedure: CARDIOVERSION;  Surgeon: Lelon Perla, MD;  Location: [facility] ENDOSCOPY;  Service: Cardiovascular;  Laterality: N/A;  . CARDIOVERSION N/A 08/30/2015   Procedure: CARDIOVERSION;  Surgeon: Sanda Klein, MD;  Location: [place] ENDOSCOPY;  Service: Cardiovascular;  Laterality: N/A;  . CARPAL TUNNEL RELEASE  2008   "right hand/thumb; carpal tunnel repair; got rid  of arthritis" (01/21/2012)  . CLIPPING OF ATRIAL APPENDAGE N/A 01/04/2015   Procedure: CLIPPING OF ATRIAL APPENDAGE;  Surgeon: Grace Isaac, MD;  Location: [place];  Service: Open Heart Surgery;  Laterality: N/A;  . COLONOSCOPY N/A 12/21/2015   Procedure: COLONOSCOPY;  Surgeon: Milus Banister, MD;  Location: [facility] ENDOSCOPY;  Service: Endoscopy;  Laterality: N/A;  . CORONARY ARTERY BYPASS GRAFT N/A 01/04/2015   Procedure: CORONARY ARTERY BYPASS GRAFTING (CABG) x 3 using left internal mammory artery and greater saphenous vein right leg harvested endoscopically.;  Surgeon: Grace Isaac, MD;  LIMA-LAD, SVG-RI, SVG-PDA  . ESOPHAGOGASTRODUODENOSCOPY (EGD) WITH PROPOFOL N/A 12/18/2015   Procedure: ESOPHAGOGASTRODUODENOSCOPY (EGD) WITH PROPOFOL;  Surgeon: Milus Banister, MD;  Location: [facility] ENDOSCOPY;  Service: Endoscopy;  Laterality: N/A;  . FACELIFT, LOWER 2/3  1995   "mini" (01/21/2012)  . GIVENS CAPSULE STUDY N/A 12/21/2015   Procedure: GIVENS CAPSULE STUDY;  Surgeon: Milus Banister, MD;  Location: [facility] ENDOSCOPY;  Service: Endoscopy;  Laterality: N/A;  . Lower Arterial Examination  10/28/2011   R. SFA stent mild-moderate mixed density plaque with elevated velocities consistent with 50% diameter reduction. L. SFA stent moderate mixed denisty plaque at mid to distal level consistent with 50-69% diameter reduction.  . OOPHORECTOMY  ~1979  . PARTIAL COLECTOMY  2010  . PERIPHERAL ARTERIAL STENT GRAFT  2012; 2012   "LLE; RLE" (01/21/2012)  . PERMANENT PACEMAKER INSERTION N/A 01/21/2012   Medtronic Adapta L implanted by Dr Sallyanne Kuster for tachy/brady syndrome  . POSTERIOR CERVICAL LAMINECTOMY  1985  . TEE WITHOUT CARDIOVERSION N/A 01/04/2015   Procedure: TRANSESOPHAGEAL ECHOCARDIOGRAM (TEE);  Surgeon: Grace Isaac, MD;  Location: [place];  Service: Open Heart Surgery;  Laterality: N/A;  . TEE WITHOUT CARDIOVERSION N/A 02/01/2015   Procedure: TRANSESOPHAGEAL ECHOCARDIOGRAM (TEE);  Surgeon: Lelon Perla, MD;  Location: [facility] ENDOSCOPY;  Service: Cardiovascular;  Laterality: N/A;  . TEE WITHOUT CARDIOVERSION N/A 08/30/2015   Procedure: TRANSESOPHAGEAL ECHOCARDIOGRAM (TEE);  Surgeon: Sanda Klein, MD;  Location: [facility] ENDOSCOPY;  Service: Cardiovascular;  Laterality: N/A;  . VAGINAL HYSTERECTOMY  1975     Current Outpatient Prescriptions  Medication Sig Dispense Refill  . alendronate (FOSAMAX) 70 MG tablet Take 1 tablet by mouth once a week.    Marland Kitchen apixaban (ELIQUIS) 5 MG TABS tablet Take 1 tablet (5 mg total) by mouth 2 (two) times daily. 180 tablet 1  . Biotin 5000 MCG TABS Take by mouth.    . carvedilol (COREG) 6.25 MG tablet Take 1 tablet (6.25 mg total) by mouth 2 (two) times daily with a meal. 180 tablet 1  . cholecalciferol (VITAMIN D) 1000 UNITS tablet Take 1,000 Units by mouth at bedtime.     . citalopram (CELEXA) 20 MG tablet Take 0.5 tablets (10 mg total) by mouth daily. (Patient taking differently: Take 10 mg by mouth every morning. )    . Coenzyme Q10 (COQ10 PO) Take 120 mg by mouth at bedtime.     . cyanocobalamin 1000 MCG tablet Take 1 tablet by mouth daily.    . Flaxseed, Linseed, (FLAX SEED OIL) 1300 MG CAPS Take 1,400 mg by mouth daily.    Marland Kitchen  fluticasone (FLONASE) 50 MCG/ACT nasal spray Place 2 sprays into both nostrils daily. 16 g 6  . furosemide (LASIX) 40 MG tablet Take 40 mg by mouth as needed.    Marland Kitchen levothyroxine (SYNTHROID, LEVOTHROID) 25 MCG tablet Take 25 mcg by mouth as directed. 1 Tablet Monday-Thursday 2 x a day Fri Sat Sun    . lisinopril (PRINIVIL,ZESTRIL) 20 MG tablet Take 1 tablet (20 mg total) by mouth daily. 90 tablet 3  . Multiple Vitamin (MULTIVITAMIN) capsule Take 1 capsule by mouth 2 (two) times daily.     . nitroGLYCERIN (NITROSTAT) 0.4 MG SL tablet Place 1 tablet (0.4 mg total) under the tongue every 5 (five) minutes x 3 doses as needed for chest pain. 25 tablet 2  . Probiotic Product (PROBIOTIC PO) Take by mouth.     No current facility-administered  medications for this visit.     Allergies:   Brilinta [ticagrelor]; Clonidine derivatives; Atorvastatin; Crestor [rosuvastatin calcium]; Exforge [amlodipine besylate-valsartan]; and Spironolactone   Social History:  The patient  reports that she has quit smoking. Her smoking use included Cigarettes. She has a 7.50 pack-year smoking history. She has never used smokeless tobacco. She reports that she drinks alcohol. She reports that she does not use drugs.   Family History:  The patient's family history includes CVA in her brother; Heart attack in her brother; Heart disease in her brother, father, and mother; Hypertension in her brother and mother; Stroke in her father and mother.    ROS:  Please see the history of present illness.   Otherwise, review of systems is positive for none.   All other systems are reviewed and negative.    PHYSICAL EXAM: VS:  BP 140/80   Pulse 98   Ht 5\' 5"  (1.651 m)   Wt 141 lb (64 kg)   BMI 23.46 kg/m  , BMI Body mass index is 23.46 kg/m. GEN: Well nourished, well developed, in no acute distress  HEENT: normal  Neck: no JVD, carotid bruits, or masses Cardiac: iRRR; no murmurs, rubs, or gallops,no edema  Respiratory:  clear to auscultation bilaterally, normal work of breathing GI: soft, nontender, nondistended, + BS MS: no deformity or atrophy  Skin: warm and dry,  device pocket is well healed Neuro:  Strength and sensation are intact Psych: euthymic mood, full affect  EKG:  EKG is not ordered today. Personal review of the ekg ordered shows sinus rhythm, intermittent A pacing, V paced   Device interrogation is reviewed today in detail.  See PaceArt for details.   Recent Labs: 12/23/2015: ALT 20 05/30/2016: B Natriuretic Peptide 864.9; BUN 16; Creatinine, Ser 1.18; Hemoglobin 11.5; Magnesium 1.9; Platelets 233; Potassium 4.6; Sodium 139; TSH 3.302    Lipid Panel     Component Value Date/Time   CHOL 158 05/12/2015 0324   TRIG 114 05/12/2015 0324     HDL 64 05/12/2015 0324   CHOLHDL 2.5 05/12/2015 0324   VLDL 23 05/12/2015 0324   LDLCALC 71 05/12/2015 0324     Wt Readings from Last 3 Encounters:  06/23/16 141 lb (64 kg)  05/30/16 145 lb 8 oz (66 kg)  05/13/16 136 lb (61.7 kg)      Other studies Reviewed: Additional studies/ records that were reviewed today include: TTE 12/04/15  Review of the above records today demonstrates:  - Left ventricle: The cavity size was normal. Systolic function was   normal. The estimated ejection fraction was in the range of 55%   to 60%. Wall  motion was normal; there were no regional wall   motion abnormalities. There was an increased relative   contribution of atrial contraction to ventricular filling.   Doppler parameters are consistent with abnormal left ventricular   relaxation (grade 1 diastolic dysfunction). - Aortic valve: Trileaflet; mildly thickened, mildly calcified   leaflets. There was moderate regurgitation.   ASSESSMENT AND PLAN:  1.  Paroxysmal atrial fibrillation: She has had her left atrial appendage clipped and is on Eliquis. Amiodarone has since been stopped.  I discussed with her options of medical management versus ablation. Risks and benefits of ablation were discussed. Risks include bleeding, tamponade, heart block, stroke, and damage to surrounding organs. She understands these risks and would like to think about her options further. She [name] call us back with the decision.  This patients CHA2DS2-VASc Score and unadjusted Ischemic Stroke Rate (% per year) is equal to 7.2 % stroke rate/year from a score of 5  Above score calculated as 1 point each if present [CHF, HTN, DM, Vascular=MI/PAD/Aortic Plaque, Age if 65-74, or Female] Above score calculated as 2 points each if present [Age > 75, or Stroke/TIA/TE]  2. Coronary artery disease: No evidence of anginal symptoms. Status post CABG with drug-eluting stents.  3. Hyperlipidemia: Per primary cardiologist  4. Sick  sinus syndrome: Status post Medtronic dual chamber pacemaker. Functioning appropriately. No changes made today.  5. Hypertension: Mildly elevated today. It is usually normal at home. No changes necessary.     Current medicines are reviewed at length with the patient today.   The patient does not have concerns regarding her medicines.  The following changes were made today:  none  Labs/ tests ordered today include:  Orders Placed This Encounter  Procedures  . CUP PACEART INCLINIC DEVICE CHECK     Disposition:   FU with Shakisha Abend 3 months  Signed, Annebelle Bostic Meredith Leeds, MD  06/23/2016 4:49 PM     [facility] [address] [place] [address] [phone] (office) [phone] (fax)

## 2016-06-30 ENCOUNTER — Telehealth: Payer: Self-pay | Admitting: Cardiovascular Disease

## 2016-06-30 NOTE — Telephone Encounter (Signed)
Returned the phone call and was instructed to call back in 10 minutes.

## 2016-06-30 NOTE — Telephone Encounter (Signed)
Does not require antibiotics prior to procedure. Please hold Eliquis for a day before the procedure, restart it the evening after the procedure. If severe bleeding is anticipated, can hold the anticoagulant for 48 hours before the procedure, but this is probably not necessary.

## 2016-06-30 NOTE — Telephone Encounter (Signed)
Instructions have been faxed to [place] at Dr. Remonia Richter office.

## 2016-06-30 NOTE — Telephone Encounter (Signed)
New message     1. What dental office are you calling from? Dr Remonia Richter office   2. What is your office phone and fax number? [phone] fax [phone]   3. What type of procedure is the patient having performed?  1 tooth extraction and a bridge   4. What date is procedure scheduled?  07/02/16   5. What is your question (ex. Antibiotics prior to procedure, holding medication-we need to know how long dentist wants pt to hold med)?  Does she need to hold any medication , does she need premed?

## 2016-07-02 DIAGNOSIS — R69 Illness, unspecified: Secondary | ICD-10-CM | POA: Diagnosis not present

## 2016-07-02 NOTE — Telephone Encounter (Signed)
Clearance faxed to Carlene Coria' attention via epic.

## 2016-07-09 ENCOUNTER — Telehealth: Payer: Self-pay | Admitting: *Deleted

## 2016-07-09 NOTE — Telephone Encounter (Signed)
Called patient to follow up, per Dr. Curt Bears request. Pt tells me that she is about the same and has decided to go ahead w/ the procedure.  However, she is unsure is she is going to have [place] perform ablation or another physician.  She is going to do some investigating and get a second opinion.  She will call me back and let me know her decision. She thanks me for following up with her.

## 2016-07-10 ENCOUNTER — Other Ambulatory Visit: Payer: Self-pay | Admitting: Surgery

## 2016-07-13 ENCOUNTER — Telehealth: Payer: Self-pay | Admitting: Cardiovascular Disease

## 2016-07-13 NOTE — Telephone Encounter (Signed)
Patient made aware no samples at this time.  Verbalized understanding and states she will call back.

## 2016-07-13 NOTE — Telephone Encounter (Signed)
New message    Patient calling the office for samples of medication:   1.  What medication and dosage are you requesting samples for? Eliquis 5 mg  2.  Are you currently out of this medication? No, she will be out on Thursday.

## 2016-07-13 NOTE — Telephone Encounter (Signed)
Attempt to call patient-call ended.  No samples available at this time.

## 2016-07-14 ENCOUNTER — Telehealth: Payer: Self-pay | Admitting: Cardiovascular Disease

## 2016-07-14 NOTE — Telephone Encounter (Signed)
New message    Pt is calling to see if we have samples of this medication. Pt states that nurse at [facility] asked her to call here and see if we had samples.  Patient calling the office for samples of medication:   1.  What medication and dosage are you requesting samples for? eliquis 5 mg  2.  Are you currently out of this medication? almost

## 2016-07-14 NOTE — Telephone Encounter (Signed)
April 08, 2016  Silverio Lay, RN      2:13 PM  Note    Medication samples have been provided to the patient.  Drug name: Eliquis 5mg                     Qty: 28            LOT: [id number]                    Exp.Date: 04/2018  Samples left at front desk for patient pick-up.  Returned call to make aware, no answer, lmtcb.     Luanna Salk, LPN      09/15/14 8:37 PM  Note    Returned call to patient Eliquis 5 mg samples left at front desk of [facility] office.       I WILL RE-ROUTE TO NL, I HAVE EXPLAINED IN THE 06/15/16 NOTE & NOW TO THE PT WE DO NOT SUPPORT MONTHLY MEDICATION WITH SAMPLES, PT OFFERED PT ASST & REFUSED, WE WILL NOT BE GIVING HER SAMPLES, THANKS

## 2016-07-14 NOTE — Telephone Encounter (Signed)
Spoke to patient Eliquis 5 mg samples left at [facility] office front desk. 

## 2016-07-16 ENCOUNTER — Ambulatory Visit: Payer: Medicare HMO | Admitting: Cardiovascular Disease

## 2016-07-23 DIAGNOSIS — R195 Other fecal abnormalities: Secondary | ICD-10-CM | POA: Diagnosis not present

## 2016-07-23 DIAGNOSIS — R1011 Right upper quadrant pain: Secondary | ICD-10-CM | POA: Diagnosis not present

## 2016-07-23 DIAGNOSIS — D126 Benign neoplasm of colon, unspecified: Secondary | ICD-10-CM | POA: Diagnosis not present

## 2016-08-03 ENCOUNTER — Other Ambulatory Visit: Payer: Self-pay | Admitting: *Deleted

## 2016-08-03 ENCOUNTER — Telehealth: Payer: Self-pay | Admitting: Cardiovascular Disease

## 2016-08-03 DIAGNOSIS — I4819 Other persistent atrial fibrillation: Secondary | ICD-10-CM

## 2016-08-03 MED ORDER — FUROSEMIDE 40 MG PO TABS: 40.0000 mg | ORAL_TABLET | ORAL | 0 refills | Status: DC | PRN

## 2016-08-03 NOTE — Telephone Encounter (Signed)
Patient calling the office for samples of medication:   1.  What medication and dosage are you requesting samples for?Eliquis-she says she takes 2 a day-needs it for a month  2.  Are you currently out of this medication? Enough until Thursday

## 2016-08-03 NOTE — Telephone Encounter (Signed)
Patient called and informed that samples will be at the front.  Medication Samples have been provided to the patient.  Drug name: Eliquis        Strength: 5 mg         Qty: 2 boxes   LOT: [id number]   Exp.Date: 12/20

## 2016-08-07 DIAGNOSIS — I739 Peripheral vascular disease, unspecified: Secondary | ICD-10-CM | POA: Diagnosis not present

## 2016-08-07 DIAGNOSIS — Z01818 Encounter for other preprocedural examination: Secondary | ICD-10-CM | POA: Diagnosis not present

## 2016-08-07 DIAGNOSIS — I2510 Atherosclerotic heart disease of native coronary artery without angina pectoris: Secondary | ICD-10-CM | POA: Diagnosis not present

## 2016-08-07 DIAGNOSIS — I5030 Unspecified diastolic (congestive) heart failure: Secondary | ICD-10-CM | POA: Diagnosis not present

## 2016-08-07 DIAGNOSIS — I482 Chronic atrial fibrillation: Secondary | ICD-10-CM | POA: Diagnosis not present

## 2016-08-07 DIAGNOSIS — Z45018 Encounter for adjustment and management of other part of cardiac pacemaker: Secondary | ICD-10-CM | POA: Diagnosis not present

## 2016-08-07 DIAGNOSIS — I10 Essential (primary) hypertension: Secondary | ICD-10-CM | POA: Diagnosis not present

## 2016-08-13 ENCOUNTER — Ambulatory Visit (INDEPENDENT_AMBULATORY_CARE_PROVIDER_SITE_OTHER): Payer: Medicare HMO | Admitting: *Deleted

## 2016-08-13 ENCOUNTER — Telehealth: Payer: Self-pay | Admitting: Cardiology

## 2016-08-13 DIAGNOSIS — I495 Sick sinus syndrome: Secondary | ICD-10-CM | POA: Diagnosis not present

## 2016-08-13 NOTE — Telephone Encounter (Signed)
Spoke with pt and reminded pt of remote transmission that is due today. Pt verbalized understanding.   

## 2016-08-14 LAB — CUP PACEART REMOTE DEVICE CHECK
Battery Impedance: 302 Ohm
Battery Remaining Longevity: 111 mo
Battery Voltage: 2.79 V
Brady Statistic AP VP Percent: 0 %
Brady Statistic AP VS Percent: 0 %
Brady Statistic AS VP Percent: 3 %
Brady Statistic AS VS Percent: 97 %
Date Time Interrogation Session: 20180706143231
Implantable Lead Implant Date: 20131212
Implantable Lead Implant Date: 20131212
Implantable Lead Location: 753859
Implantable Lead Location: 753860
Implantable Lead Model: 5076
Implantable Lead Model: 5076
Implantable Pulse Generator Implant Date: 20131212
Lead Channel Impedance Value: 420 Ohm
Lead Channel Impedance Value: 449 Ohm
Lead Channel Pacing Threshold Amplitude: 0.75 V
Lead Channel Pacing Threshold Amplitude: 1 V
Lead Channel Pacing Threshold Pulse Width: 0.4 ms
Lead Channel Pacing Threshold Pulse Width: 0.4 ms
Lead Channel Sensing Intrinsic Amplitude: 0.7 mV
Lead Channel Sensing Intrinsic Amplitude: 11.2 mV
Lead Channel Setting Pacing Amplitude: 2 V
Lead Channel Setting Pacing Amplitude: 2.5 V
Lead Channel Setting Pacing Pulse Width: 0.4 ms
Lead Channel Setting Sensing Sensitivity: 5.6 mV

## 2016-08-14 NOTE — Progress Notes (Signed)
Remote pacemaker transmission.   

## 2016-08-21 ENCOUNTER — Encounter: Payer: Self-pay | Admitting: Cardiology

## 2016-08-26 ENCOUNTER — Telehealth: Payer: Self-pay | Admitting: Cardiovascular Disease

## 2016-08-26 NOTE — Telephone Encounter (Signed)
New message     Patient calling the office for samples of medication:   1.  What medication and dosage are you requesting samples for? eliquis 5 mg   2.  Are you currently out of this medication? Has only a few left

## 2016-08-27 NOTE — Telephone Encounter (Signed)
Pt.notified

## 2016-08-27 NOTE — Telephone Encounter (Signed)
Samples at the front desk 

## 2016-08-28 DIAGNOSIS — R079 Chest pain, unspecified: Secondary | ICD-10-CM | POA: Diagnosis not present

## 2016-08-28 DIAGNOSIS — Z951 Presence of aortocoronary bypass graft: Secondary | ICD-10-CM | POA: Diagnosis not present

## 2016-08-28 DIAGNOSIS — Z950 Presence of cardiac pacemaker: Secondary | ICD-10-CM | POA: Diagnosis not present

## 2016-08-28 DIAGNOSIS — I2510 Atherosclerotic heart disease of native coronary artery without angina pectoris: Secondary | ICD-10-CM | POA: Diagnosis not present

## 2016-08-28 DIAGNOSIS — I482 Chronic atrial fibrillation: Secondary | ICD-10-CM | POA: Diagnosis not present

## 2016-08-28 DIAGNOSIS — I10 Essential (primary) hypertension: Secondary | ICD-10-CM | POA: Diagnosis not present

## 2016-08-28 DIAGNOSIS — I739 Peripheral vascular disease, unspecified: Secondary | ICD-10-CM | POA: Diagnosis not present

## 2016-09-02 DIAGNOSIS — E875 Hyperkalemia: Secondary | ICD-10-CM | POA: Diagnosis not present

## 2016-09-02 DIAGNOSIS — I351 Nonrheumatic aortic (valve) insufficiency: Secondary | ICD-10-CM | POA: Diagnosis not present

## 2016-09-02 DIAGNOSIS — I708 Atherosclerosis of other arteries: Secondary | ICD-10-CM | POA: Diagnosis not present

## 2016-09-02 DIAGNOSIS — I483 Typical atrial flutter: Secondary | ICD-10-CM | POA: Diagnosis not present

## 2016-09-02 DIAGNOSIS — I77810 Thoracic aortic ectasia: Secondary | ICD-10-CM | POA: Diagnosis not present

## 2016-09-02 DIAGNOSIS — I130 Hypertensive heart and chronic kidney disease with heart failure and stage 1 through stage 4 chronic kidney disease, or unspecified chronic kidney disease: Secondary | ICD-10-CM | POA: Diagnosis not present

## 2016-09-02 DIAGNOSIS — I340 Nonrheumatic mitral (valve) insufficiency: Secondary | ICD-10-CM | POA: Diagnosis not present

## 2016-09-02 DIAGNOSIS — I482 Chronic atrial fibrillation: Secondary | ICD-10-CM | POA: Diagnosis not present

## 2016-09-02 DIAGNOSIS — I2510 Atherosclerotic heart disease of native coronary artery without angina pectoris: Secondary | ICD-10-CM | POA: Diagnosis not present

## 2016-09-02 DIAGNOSIS — I25119 Atherosclerotic heart disease of native coronary artery with unspecified angina pectoris: Secondary | ICD-10-CM | POA: Diagnosis not present

## 2016-09-02 DIAGNOSIS — I517 Cardiomegaly: Secondary | ICD-10-CM | POA: Diagnosis not present

## 2016-09-02 DIAGNOSIS — I739 Peripheral vascular disease, unspecified: Secondary | ICD-10-CM | POA: Diagnosis not present

## 2016-09-02 DIAGNOSIS — I5032 Chronic diastolic (congestive) heart failure: Secondary | ICD-10-CM | POA: Diagnosis not present

## 2016-09-02 DIAGNOSIS — I4891 Unspecified atrial fibrillation: Secondary | ICD-10-CM | POA: Diagnosis not present

## 2016-09-02 DIAGNOSIS — I700 Atherosclerosis of aorta: Secondary | ICD-10-CM | POA: Diagnosis not present

## 2016-09-02 DIAGNOSIS — I495 Sick sinus syndrome: Secondary | ICD-10-CM | POA: Diagnosis not present

## 2016-09-02 DIAGNOSIS — Q223 Other congenital malformations of pulmonary valve: Secondary | ICD-10-CM | POA: Diagnosis not present

## 2016-09-03 DIAGNOSIS — I483 Typical atrial flutter: Secondary | ICD-10-CM | POA: Diagnosis not present

## 2016-09-03 DIAGNOSIS — I5030 Unspecified diastolic (congestive) heart failure: Secondary | ICD-10-CM | POA: Diagnosis not present

## 2016-09-03 DIAGNOSIS — I77810 Thoracic aortic ectasia: Secondary | ICD-10-CM | POA: Diagnosis not present

## 2016-09-03 DIAGNOSIS — R9431 Abnormal electrocardiogram [ECG] [EKG]: Secondary | ICD-10-CM | POA: Diagnosis not present

## 2016-09-03 DIAGNOSIS — I5032 Chronic diastolic (congestive) heart failure: Secondary | ICD-10-CM | POA: Diagnosis not present

## 2016-09-03 DIAGNOSIS — I459 Conduction disorder, unspecified: Secondary | ICD-10-CM | POA: Diagnosis not present

## 2016-09-03 DIAGNOSIS — I739 Peripheral vascular disease, unspecified: Secondary | ICD-10-CM | POA: Diagnosis not present

## 2016-09-03 DIAGNOSIS — I708 Atherosclerosis of other arteries: Secondary | ICD-10-CM | POA: Diagnosis not present

## 2016-09-03 DIAGNOSIS — E039 Hypothyroidism, unspecified: Secondary | ICD-10-CM | POA: Diagnosis not present

## 2016-09-03 DIAGNOSIS — I130 Hypertensive heart and chronic kidney disease with heart failure and stage 1 through stage 4 chronic kidney disease, or unspecified chronic kidney disease: Secondary | ICD-10-CM | POA: Diagnosis not present

## 2016-09-03 DIAGNOSIS — I10 Essential (primary) hypertension: Secondary | ICD-10-CM | POA: Diagnosis not present

## 2016-09-03 DIAGNOSIS — I2510 Atherosclerotic heart disease of native coronary artery without angina pectoris: Secondary | ICD-10-CM | POA: Diagnosis not present

## 2016-09-03 DIAGNOSIS — E875 Hyperkalemia: Secondary | ICD-10-CM | POA: Diagnosis not present

## 2016-09-03 DIAGNOSIS — I25119 Atherosclerotic heart disease of native coronary artery with unspecified angina pectoris: Secondary | ICD-10-CM | POA: Diagnosis not present

## 2016-09-03 DIAGNOSIS — N183 Chronic kidney disease, stage 3 (moderate): Secondary | ICD-10-CM | POA: Diagnosis not present

## 2016-09-03 DIAGNOSIS — I4891 Unspecified atrial fibrillation: Secondary | ICD-10-CM | POA: Diagnosis not present

## 2016-09-03 DIAGNOSIS — I495 Sick sinus syndrome: Secondary | ICD-10-CM | POA: Diagnosis not present

## 2016-09-03 DIAGNOSIS — I4581 Long QT syndrome: Secondary | ICD-10-CM | POA: Diagnosis not present

## 2016-09-03 DIAGNOSIS — R69 Illness, unspecified: Secondary | ICD-10-CM | POA: Diagnosis not present

## 2016-09-04 ENCOUNTER — Encounter: Payer: Self-pay | Admitting: Cardiology

## 2016-09-04 DIAGNOSIS — I459 Conduction disorder, unspecified: Secondary | ICD-10-CM | POA: Diagnosis not present

## 2016-09-04 DIAGNOSIS — I5030 Unspecified diastolic (congestive) heart failure: Secondary | ICD-10-CM | POA: Diagnosis not present

## 2016-09-04 DIAGNOSIS — I10 Essential (primary) hypertension: Secondary | ICD-10-CM | POA: Diagnosis not present

## 2016-09-04 DIAGNOSIS — I2510 Atherosclerotic heart disease of native coronary artery without angina pectoris: Secondary | ICD-10-CM | POA: Diagnosis not present

## 2016-09-04 DIAGNOSIS — E039 Hypothyroidism, unspecified: Secondary | ICD-10-CM | POA: Diagnosis not present

## 2016-09-04 DIAGNOSIS — I739 Peripheral vascular disease, unspecified: Secondary | ICD-10-CM | POA: Diagnosis not present

## 2016-09-04 DIAGNOSIS — R69 Illness, unspecified: Secondary | ICD-10-CM | POA: Diagnosis not present

## 2016-09-04 DIAGNOSIS — R9431 Abnormal electrocardiogram [ECG] [EKG]: Secondary | ICD-10-CM | POA: Diagnosis not present

## 2016-09-04 DIAGNOSIS — I4891 Unspecified atrial fibrillation: Secondary | ICD-10-CM | POA: Diagnosis not present

## 2016-09-04 DIAGNOSIS — N183 Chronic kidney disease, stage 3 (moderate): Secondary | ICD-10-CM | POA: Diagnosis not present

## 2016-09-05 DIAGNOSIS — I4581 Long QT syndrome: Secondary | ICD-10-CM | POA: Diagnosis not present

## 2016-09-05 DIAGNOSIS — I5030 Unspecified diastolic (congestive) heart failure: Secondary | ICD-10-CM | POA: Diagnosis not present

## 2016-09-05 DIAGNOSIS — E875 Hyperkalemia: Secondary | ICD-10-CM | POA: Diagnosis not present

## 2016-09-05 DIAGNOSIS — I459 Conduction disorder, unspecified: Secondary | ICD-10-CM | POA: Diagnosis not present

## 2016-09-05 DIAGNOSIS — R9431 Abnormal electrocardiogram [ECG] [EKG]: Secondary | ICD-10-CM | POA: Diagnosis not present

## 2016-09-05 DIAGNOSIS — R69 Illness, unspecified: Secondary | ICD-10-CM | POA: Diagnosis not present

## 2016-09-05 DIAGNOSIS — E039 Hypothyroidism, unspecified: Secondary | ICD-10-CM | POA: Diagnosis not present

## 2016-09-05 DIAGNOSIS — I10 Essential (primary) hypertension: Secondary | ICD-10-CM | POA: Diagnosis not present

## 2016-09-05 DIAGNOSIS — I2510 Atherosclerotic heart disease of native coronary artery without angina pectoris: Secondary | ICD-10-CM | POA: Diagnosis not present

## 2016-09-05 DIAGNOSIS — I739 Peripheral vascular disease, unspecified: Secondary | ICD-10-CM | POA: Diagnosis not present

## 2016-09-05 DIAGNOSIS — I4891 Unspecified atrial fibrillation: Secondary | ICD-10-CM | POA: Diagnosis not present

## 2016-09-05 HISTORY — PX: OTHER SURGICAL HISTORY: SHX169

## 2016-09-06 DIAGNOSIS — I5030 Unspecified diastolic (congestive) heart failure: Secondary | ICD-10-CM | POA: Diagnosis not present

## 2016-09-06 DIAGNOSIS — I25119 Atherosclerotic heart disease of native coronary artery with unspecified angina pectoris: Secondary | ICD-10-CM | POA: Diagnosis not present

## 2016-09-06 DIAGNOSIS — I739 Peripheral vascular disease, unspecified: Secondary | ICD-10-CM | POA: Diagnosis not present

## 2016-09-06 DIAGNOSIS — I10 Essential (primary) hypertension: Secondary | ICD-10-CM | POA: Diagnosis not present

## 2016-09-06 DIAGNOSIS — I4891 Unspecified atrial fibrillation: Secondary | ICD-10-CM | POA: Diagnosis not present

## 2016-09-09 DIAGNOSIS — Z8679 Personal history of other diseases of the circulatory system: Secondary | ICD-10-CM | POA: Diagnosis not present

## 2016-09-09 DIAGNOSIS — Z9889 Other specified postprocedural states: Secondary | ICD-10-CM | POA: Diagnosis not present

## 2016-09-09 DIAGNOSIS — I4891 Unspecified atrial fibrillation: Secondary | ICD-10-CM | POA: Diagnosis not present

## 2016-09-09 DIAGNOSIS — E875 Hyperkalemia: Secondary | ICD-10-CM | POA: Diagnosis not present

## 2016-09-21 ENCOUNTER — Telehealth: Payer: Self-pay | Admitting: Cardiology

## 2016-09-21 NOTE — Telephone Encounter (Signed)
Checked sample closet at this current time we do not have samples at this time and she could check back later this week. Patient voiced understanding.

## 2016-09-21 NOTE — Telephone Encounter (Signed)
New message    Patient calling the office for samples of medication:   1.  What medication and dosage are you requesting samples for? ELIQUIS   2.  Are you currently out of this medication? yes

## 2016-09-25 ENCOUNTER — Encounter: Payer: Self-pay | Admitting: Cardiovascular Disease

## 2016-09-25 ENCOUNTER — Ambulatory Visit (INDEPENDENT_AMBULATORY_CARE_PROVIDER_SITE_OTHER): Payer: Medicare HMO | Admitting: Cardiovascular Disease

## 2016-09-25 VITALS — BP 92/64 | HR 87 | Ht 65.0 in | Wt 139.0 lb

## 2016-09-25 DIAGNOSIS — I10 Essential (primary) hypertension: Secondary | ICD-10-CM | POA: Diagnosis not present

## 2016-09-25 DIAGNOSIS — Z950 Presence of cardiac pacemaker: Secondary | ICD-10-CM

## 2016-09-25 DIAGNOSIS — I5032 Chronic diastolic (congestive) heart failure: Secondary | ICD-10-CM

## 2016-09-25 DIAGNOSIS — E213 Hyperparathyroidism, unspecified: Secondary | ICD-10-CM | POA: Diagnosis not present

## 2016-09-25 DIAGNOSIS — I739 Peripheral vascular disease, unspecified: Secondary | ICD-10-CM | POA: Diagnosis not present

## 2016-09-25 DIAGNOSIS — R0602 Shortness of breath: Secondary | ICD-10-CM | POA: Diagnosis not present

## 2016-09-25 DIAGNOSIS — Z5181 Encounter for therapeutic drug level monitoring: Secondary | ICD-10-CM | POA: Diagnosis not present

## 2016-09-25 DIAGNOSIS — I4892 Unspecified atrial flutter: Secondary | ICD-10-CM

## 2016-09-25 DIAGNOSIS — Z7901 Long term (current) use of anticoagulants: Secondary | ICD-10-CM

## 2016-09-25 DIAGNOSIS — I25810 Atherosclerosis of coronary artery bypass graft(s) without angina pectoris: Secondary | ICD-10-CM

## 2016-09-25 DIAGNOSIS — Z79899 Other long term (current) drug therapy: Secondary | ICD-10-CM

## 2016-09-25 NOTE — Patient Instructions (Signed)
Dr Sallyanne Kuster recommends that you continue on your current medications as directed. Please refer to the Current Medication list given to you today.  Your physician recommends that you return for lab work in 3 weeks.  Dr Sallyanne Kuster recommends that you schedule a follow-up appointment in 5 months. You will receive a reminder letter in the mail two months in advance. If you don't receive a letter, please call our office to schedule the follow-up appointment.  If you need a refill on your cardiac medications before your next appointment, please call your pharmacy.

## 2016-09-25 NOTE — Progress Notes (Signed)
Patient ID: [name], female   DOB: 1940/12/28, 76 y.o.   MRN: [id number]     Cardiology Office Note    Date:  09/25/2016   ID:  [name], DOB Apr 05, 1940, MRN [id number]  PCP:  Kathyrn Lass, MD  Cardiologist:   Sanda Klein, MD   Chief Complaint  Patient presents with  . Follow-up    8 week f/u.  [name] Headache  . Chest Pain    History of Present Illness:  [name] is a 76 y.o. female with coronary artery disease s/p bypass surgery (November 2016) and placement of drug-eluting stents (DES to distal SVG-RCA, April 2017 ) and left atrial appendage clipping, symptomatic paroxysmal atrial fibrillation and atrial flutter, PAD with previous stents 2012, history of GI bleeding while on anticoagulation, volatile hypertension.  She denies any current problems with dyspnea or angina either at rest or with activity, lower extremity edema, palpitations, syncope or focal neurological complaints, recent GI bleeding.  Since her last appointment, she has undergone EP study and radiofrequency ablation at [facility] with Dr. Antonieta Pert. She was extremely happy with the care that she received at [facility]. She had cavotricuspid isthmus ablation as well as pulmonary vein isolation. She is currently on dofetilide (initiated July 26) and carvedilol. Colchicine was prescribed after the ablation, I presume prophylactically for pericarditis/effusion. She has a follow-up with the electrophysiologist at [facility] on October 5.  During that hospitalization she was found to be persistently hyperkalemic and her lisinopril was discontinued. An increased dose of carvedilol 25 mg twice a day was recommended, but she did not tolerate it. She is now taking a self adjusted prescription of carvedilol 12.5 mg 3 times daily which she seems to be doing okay with. On that regimen her blood pressure today is borderline at 92/64. She is not dizzy right now. She has also cut down her furosemide to every other day and has not had problems  with dyspnea or edema.  Pacemaker interrogation today shows virtually complete absence of any atrial arrhythmia since her ablation. She has only had 2 episodes of mode switch which were both less than a minute in duration and were too short to have associated intracardiac electrograms. She has 98% atrial pacing and only 1.3% ventricular pacing with satisfactory heart rate histogram distribution. Her dual chamber Medtronic Adapta device was implanted in 2013, has roughly 8 years of remaining generator longevity and has normal lead parameters.  She has not had new problems with syncope or any recurrent GI bleeding. She is on appropriate anticoagulation with Eliquis.  Amiodarone caused hypothyroidism and alopecia and she has not taken that medication since April. She has tried taking statins but has repeatedly stopped them due to musculoskeletal problems. Most recently she was intolerant to atorvastatin and rosuvastatin. She is currently not taking any lipid-lowering agents.  She has normal left ventricular systolic function. She does have evidence of grade 2 diastolic dysfunction on previous echo. At the time of her bypass surgery she received an Atricure left atrial appendage clip. She is also on clopidogrel following her drug-eluting stents in April 2017. The pacemaker was implanted in 2013 for symptomatic sinus bradycardia. She also has a history of peripheral arterial disease and received bilateral superficial femoral artery stents about 3 years ago. She has never smoked but drinks daily. She has treated hypertension.  After undergoing bypass surgery in November 2016 she returned with non-ST segment elevation myocardial infarction in April 2017 and required placement of stents both  in the native right coronary artery (3.020 mm Synergy DES) and in the saphenous vein graft to the distal right coronary artery (2.7538 mm Synergy DES) due to early graft dysfunction. The saphenous vein graft to the ramus  intermedius was also occluded, but this vessel was left for medical therapy.    Past Medical History:  Diagnosis Date  . ANXIETY 12/31/2009  . Aortic insufficiency    a. mod by echo 2017.  [name] CAD (coronary artery disease)    a. s/p CABGx3 and LAA clipping in 12/2014, NSTEMI 05/2015 s/p DES to native RCA and SVG-dRCA; occluded ramus-SVG was treated medically). c. neg nuc 10/2015 at [facility].  . Chronic diastolic CHF (congestive heart failure) ([place])   . Chronic fatigue   . CKD (chronic kidney disease), stage II   . DEPRESSION 12/31/2009  . [place] DISEASE, CERVICAL 12/31/2009  . [place] DISEASE, LUMBAR 12/31/2009  . DIVERTICULITIS, HX OF 12/31/2009  . Essential hypertension   . GLUCOSE INTOLERANCE 12/31/2009  . Habitual alcohol use   . Hypercalcemia   . Hyperkalemia   . Hyperlipidemia 12/31/2009  . MENOPAUSE, EARLY 12/31/2009  . NSTEMI (non-ST elevated myocardial infarction) ([place]) 05/11/2015  . Orthostatic hypotension   . OSTEOARTHRITIS, HAND   . Pacemaker 01/21/2012   MDT Adapta dual chamber  . Paroxysmal atrial flutter ([place])   . Persistent atrial fibrillation ([place])   . Pre-diabetes   . PVD (peripheral vascular disease), hx stents to bil SFAs 02/2010   . Symptomatic sinus bradycardia 01/22/2012  . Syncope    a. 11/2015 ? due to medications.  . Tachy-brady syndrome ([place])    a. s/p MDT PPM 2013.  . Tricuspid regurgitation     Past Surgical History:  Procedure Laterality Date  . AUGMENTATION MAMMAPLASTY  1983  . CARDIAC CATHETERIZATION N/A 01/01/2015   Procedure: Left Heart Cath and Coronary Angiography;  Surgeon: Jettie Booze, MD;  Location: [place] CV LAB;  Service: Cardiovascular;  Laterality: N/A;  . CARDIAC CATHETERIZATION N/A 05/12/2015   Procedure: Left Heart Cath and Coronary Angiography;  Surgeon: Troy Sine, MD;  Location: [place] [facility];  Service: Cardiovascular;  Laterality: N/A;  . CARDIAC CATHETERIZATION N/A 05/12/2015   Procedure: Coronary Stent Intervention;   Surgeon: Troy Sine, MD;  Location: [place] [facility];  Service: Cardiovascular;  Laterality: N/A;  . CARDIOVERSION N/A 02/01/2015   Procedure: CARDIOVERSION;  Surgeon: Lelon Perla, MD;  Location: [facility] ENDOSCOPY;  Service: Cardiovascular;  Laterality: N/A;  . CARDIOVERSION N/A 08/30/2015   Procedure: CARDIOVERSION;  Surgeon: Sanda Klein, MD;  Location: [place] ENDOSCOPY;  Service: Cardiovascular;  Laterality: N/A;  . CARPAL TUNNEL RELEASE  2008   "right hand/thumb; carpal tunnel repair; got rid of arthritis" (01/21/2012)  . CLIPPING OF ATRIAL APPENDAGE N/A 01/04/2015   Procedure: CLIPPING OF ATRIAL APPENDAGE;  Surgeon: Grace Isaac, MD;  Location: [place];  Service: Open Heart Surgery;  Laterality: N/A;  . COLONOSCOPY N/A 12/21/2015   Procedure: COLONOSCOPY;  Surgeon: Milus Banister, MD;  Location: [facility] ENDOSCOPY;  Service: Endoscopy;  Laterality: N/A;  . CORONARY ARTERY BYPASS GRAFT N/A 01/04/2015   Procedure: CORONARY ARTERY BYPASS GRAFTING (CABG) x 3 using left internal mammory artery and greater saphenous vein right leg harvested endoscopically.;  Surgeon: Grace Isaac, MD;  LIMA-LAD, SVG-RI, SVG-PDA  . ESOPHAGOGASTRODUODENOSCOPY (EGD) WITH PROPOFOL N/A 12/18/2015   Procedure: ESOPHAGOGASTRODUODENOSCOPY (EGD) WITH PROPOFOL;  Surgeon: Milus Banister, MD;  Location: [facility] ENDOSCOPY;  Service: Endoscopy;  Laterality: N/A;  .  FACELIFT, LOWER 2/3  1995   "mini" (01/21/2012)  . GIVENS CAPSULE STUDY N/A 12/21/2015   Procedure: GIVENS CAPSULE STUDY;  Surgeon: Milus Banister, MD;  Location: [facility] ENDOSCOPY;  Service: Endoscopy;  Laterality: N/A;  . Lower Arterial Examination  10/28/2011   R. SFA stent mild-moderate mixed density plaque with elevated velocities consistent with 50% diameter reduction. L. SFA stent moderate mixed denisty plaque at mid to distal level consistent with 50-69% diameter reduction.  . OOPHORECTOMY  ~1979  . PARTIAL COLECTOMY  2010  . PERIPHERAL ARTERIAL STENT GRAFT   2012; 2012   "LLE; RLE" (01/21/2012)  . PERMANENT PACEMAKER INSERTION N/A 01/21/2012   Medtronic Adapta L implanted by Dr Sallyanne Kuster for tachy/brady syndrome  . POSTERIOR CERVICAL LAMINECTOMY  1985  . TEE WITHOUT CARDIOVERSION N/A 01/04/2015   Procedure: TRANSESOPHAGEAL ECHOCARDIOGRAM (TEE);  Surgeon: Grace Isaac, MD;  Location: [place];  Service: Open Heart Surgery;  Laterality: N/A;  . TEE WITHOUT CARDIOVERSION N/A 02/01/2015   Procedure: TRANSESOPHAGEAL ECHOCARDIOGRAM (TEE);  Surgeon: Lelon Perla, MD;  Location: [facility] ENDOSCOPY;  Service: Cardiovascular;  Laterality: N/A;  . TEE WITHOUT CARDIOVERSION N/A 08/30/2015   Procedure: TRANSESOPHAGEAL ECHOCARDIOGRAM (TEE);  Surgeon: Sanda Klein, MD;  Location: [facility] ENDOSCOPY;  Service: Cardiovascular;  Laterality: N/A;  . VAGINAL HYSTERECTOMY  1975    Current Medications: Outpatient Medications Prior to Visit  Medication Sig Dispense Refill  . alendronate (FOSAMAX) 70 MG tablet Take 1 tablet by mouth once a week.    [name] apixaban (ELIQUIS) 5 MG TABS tablet Take 1 tablet (5 mg total) by mouth 2 (two) times daily. 180 tablet 1  . Biotin 5000 MCG TABS Take by mouth.    . cholecalciferol (VITAMIN D) 1000 UNITS tablet Take 1,000 Units by mouth at bedtime.     . citalopram (CELEXA) 20 MG tablet Take 0.5 tablets (10 mg total) by mouth daily. (Patient taking differently: Take 10 mg by mouth every morning. )    . Coenzyme Q10 (COQ10 PO) Take 120 mg by mouth at bedtime.     . furosemide (LASIX) 40 MG tablet Take 1 tablet (40 mg total) by mouth as needed. 90 tablet 0  . levothyroxine (SYNTHROID, LEVOTHROID) 25 MCG tablet Take 25 mcg by mouth as directed. 1 Tablet Monday-Thursday 2 x a day Fri Sat Sun    . Multiple Vitamin (MULTIVITAMIN) capsule Take 1 capsule by mouth 2 (two) times daily.     . nitroGLYCERIN (NITROSTAT) 0.4 MG SL tablet Place 1 tablet (0.4 mg total) under the tongue every 5 (five) minutes x 3 doses as needed for chest pain. 25 tablet 2    . lisinopril (PRINIVIL,ZESTRIL) 20 MG tablet Take 1 tablet (20 mg total) by mouth daily. 90 tablet 3  . carvedilol (COREG) 6.25 MG tablet Take 1 tablet (6.25 mg total) by mouth 2 (two) times daily with a meal. 180 tablet 1  . cyanocobalamin 1000 MCG tablet Take 1 tablet by mouth daily.    . Flaxseed, Linseed, (FLAX SEED OIL) 1300 MG CAPS Take 1,400 mg by mouth daily.    . fluticasone (FLONASE) 50 MCG/ACT nasal spray Place 2 sprays into both nostrils daily. 16 g 6  . Probiotic Product (PROBIOTIC PO) Take by mouth.     No facility-administered medications prior to visit.      Allergies:   Brilinta [ticagrelor]; Clonidine derivatives; Atorvastatin; Crestor [rosuvastatin calcium]; Exforge [amlodipine besylate-valsartan]; and Spironolactone   Social History   Social History  . Marital status: Widowed  Spouse name: N/A  . Number of children: 3  . Years of education: N/A   Occupational History  . Retired Chemical engineer Retired   Social History Main Topics  . Smoking status: Former Smoker    Packs/day: 0.75    Years: 10.00    Types: Cigarettes  . Smokeless tobacco: Never Used     Comment: 01/21/2012 "quit smoking ~ 2002"  . Alcohol use Yes     Comment: 01/21/2012 "3 times/wk I have a couple mixed drinks"; 06/28/2015 "nothing in the last 3 weeks cause I haven't felt good", reduced recently  . Drug use: No  . Sexual activity: Not Currently   Other Topics Concern  . None   Social History Narrative   Lives in [place].  Retired.     Family History:  The patient's family history includes CVA in her brother; Heart attack in her brother; Heart disease in her brother, father, and mother; Hypertension in her brother and mother; Stroke in her father and mother.   ROS:   Please see the history of present illness.    ROS All other systems reviewed and are negative.   PHYSICAL EXAM:   VS:  BP 92/64   Pulse 87   Ht 5\' 5"  (1.651 m)   Wt 139 lb (63 kg)   BMI 23.13 kg/m      GEN: Well nourished, well developed, in no acute distress   General: Alert, oriented x3, no distress Head: no evidence of trauma, PERRL, EOMI, no exophtalmos or lid lag, no myxedema, no xanthelasma; normal ears, nose and oropharynx Neck: normal jugular venous pulsations and no hepatojugular reflux; brisk carotid pulses without delay and no carotid bruits Chest: clear to auscultation, no signs of consolidation by percussion or palpation, normal fremitus, symmetrical and full respiratory excursions Cardiovascular: normal position and quality of the apical impulse, regular rhythm, normal first and second heart sounds, no murmurs, rubs or gallops Abdomen: no tenderness or distention, no masses by palpation, no abnormal pulsatility or arterial bruits, normal bowel sounds, no hepatosplenomegaly Extremities: no clubbing, cyanosis or edema; 2+ radial, ulnar and brachial pulses bilaterally; 2+ right femoral, posterior tibial and dorsalis pedis pulses; 2+ left femoral, posterior tibial and dorsalis pedis pulses; no subclavian or femoral bruits Neurological: grossly nonfocal Psych: Normal mood and affect, quite cheerful today  Wt Readings from Last 3 Encounters:  09/25/16 139 lb (63 kg)  06/23/16 141 lb (64 kg)  05/30/16 145 lb 8 oz (66 kg)      Studies/Labs Reviewed:   EKG:  EKG is ordered today.  It shows atrial paced, ventricular sensed rhythm, ST segment depression and T-wave inversion in leads V3-V6, 1, 2, aVL, QTC 425 ms  Recent Labs: 12/23/2015: ALT 20 05/30/2016: B Natriuretic Peptide 864.9; BUN 16; Creatinine, Ser 1.18; Hemoglobin 11.5; Magnesium 1.9; Platelets 233; Potassium 4.6; Sodium 139; TSH 3.302   Lipid Panel    Component Value Date/Time   CHOL 158 05/12/2015 0324   TRIG 114 05/12/2015 0324   HDL 64 05/12/2015 0324   CHOLHDL 2.5 05/12/2015 0324   VLDL 23 05/12/2015 0324   LDLCALC 71 05/12/2015 0324   10/04/2015  BUN 38 creatinine 1.46, Ca 11.6, K 5.8.  Total cholesterol  244, triglycerides 195, HDL 63, LDL 142 10/15/2015  creatinine 1.05, BUN 23, K 6.3, Ca 11.6, ionized calcium  6.0. Intact PTH 83. TSH of 4.88,  free T4 of 1.13. 09/09/2016 Creatinine 1.08, BUN 18, potassium 5.1  ASSESSMENT:    1. Chronic diastolic  heart failure ([place])   2. Coronary artery disease involving coronary bypass graft of native heart without angina pectoris   3. Paroxysmal atrial flutter ([place])   4. Encounter for monitoring dofetilide therapy   5. Long term current use of anticoagulant   6. Pacemaker   7. PVD (peripheral vascular disease), hx stents to bil SFAs 02/2010   8. Essential hypertension   9. Hyperparathyroidism ([place])   10. Medication management   11. Shortness of breath      PLAN:  In order of problems listed above:  1. CHF: Euvolemic clinically today, asymptomatic. Currently she is only taking her furosemide on an every other day basis. Close to the estimated dry weight of around 135 pounds (Seemed to be "dry" at 130 pounds and was in heart failure at 145 pounds). Preserved left ventricular systolic function. NYHA class 2. Since she is feeling so well on the current regimen, we'll check another BNP for baseline in a few weeks. I think enough time has passed since her ablation that wed expect this to be back to normal 2. CAD: Has not had angina pectoris in a long time. Currently without symptoms of angina pectoris. Unfortunately, she had early failure of 2 saphenous vein grafts, one of which was treated with a drug-eluting stent. Her mammary artery bypass was widely patent.. 3. AFib/flutter: Remarkable abolition of arrhythmia following ablation and initiation of dofetilide. She is also on a intermediate dose of carvedilol, total of 37.5 mg a day in a self adjusted regimen that she seems to tolerate well. She has an atrial appendage clip and is on Eliquis also. She has a follow-up with Dr. Antonieta Pert on October 5. 4. Dofetilide: QTC is well within normal range roughly not  excessively prolonged. Will check her potassium and renal function one more time before her follow-up in October. Reminded her about the risks of drug interactions with other QT prolonging agents. 5. Eliquis: Uninterrupted anticoagulation is very important in the early after ablation, but in the long run interruption of anticoagulation would be a lower risk endeavor, since she has had atrial appendage clipping. 6. PPM: Normal device function. Remote download every 3 months and yearly office visits. 7. PAD: Currently denies intermittent claudication. November 2017 Bilateral lower extremity Doppler study showed normal ABIs and waveforms in both extremities. Blood pressure seems to be slightly lower in the left upper extremity 8. HTN: Her blood pressure should always be checked in the right upper extremity. Blood pressures a little low today, but asymptomatic. Her ACE inhibitor has been stopped due to hyperkalemia. No changes made to blood pressure medicines today.. 9. Hypercalcemia: Labs suggested she has primary hyperparathyroidism and she has very slight residual hypercalcemia on her most recent labs.    Medication Adjustments/Labs and Tests Ordered: Current medicines are reviewed at length with the patient today.  Concerns regarding medicines are outlined above.  Medication changes, Labs and Tests ordered today are listed in the Patient Instructions below. Patient Instructions  Dr Sallyanne Kuster recommends that you continue on your current medications as directed. Please refer to the Current Medication list given to you today.  Your physician recommends that you return for lab work in 3 weeks.  Dr Sallyanne Kuster recommends that you schedule a follow-up appointment in 5 months. You will receive a reminder letter in the mail two months in advance. If you don't receive a letter, please call our office to schedule the follow-up appointment.  If you need a refill on your cardiac medications before your next  appointment, please call your pharmacy.    Signed, Sanda Klein, MD  09/25/2016 7:59 PM    [facility] [place], [place], [place]  [address] Phone: [phone]; Fax: [phone]

## 2016-09-29 DIAGNOSIS — R69 Illness, unspecified: Secondary | ICD-10-CM | POA: Diagnosis not present

## 2016-10-02 ENCOUNTER — Telehealth: Payer: Self-pay | Admitting: Cardiovascular Disease

## 2016-10-02 DIAGNOSIS — J90 Pleural effusion, not elsewhere classified: Secondary | ICD-10-CM

## 2016-10-02 MED ORDER — CARVEDILOL 12.5 MG PO TABS: 6.2500 mg | ORAL_TABLET | Freq: Two times a day (BID) | ORAL | Status: DC

## 2016-10-02 NOTE — Telephone Encounter (Signed)
New message   Pt states that she just had an ablation at [facility]. She said her bp yesterday was 90/60 yesterday when she stopped at the fire department.   Pt c/o of Chest Pain: STAT if CP now or developed within 24 hours  1. Are you having CP right now? Yes-chest discomfort.   2. Are you experiencing any other symptoms (ex. SOB, nausea, vomiting, sweating)? No   3. How long have you been experiencing CP? Since yesterday  4. Is your CP continuous or coming and going? Coming and going  5. Have you taken Nitroglycerin? No  ?

## 2016-10-02 NOTE — Telephone Encounter (Signed)
Spoke with pt she states that she has been experiencing intermittent chest pressure(yesterday and today) across her chest and sometimes it goes around to her back but not all the time, she states that pressure did wake her up multiple times, she states that she did not take nitro because this is not chest pain and also she states that last time she took nitro she passed out and does not want to take again unless she is sure that she is having chest pain. Pt also c/o fatigue and  Weakness, denies any swelling, n/v, chest pain or sweating. She states that she has not taken her BP today she went to fire department yesterday because she was out and her BP was 90/60. Pt states that she is taking her medications as ordered except her carvedilol she is taking 6.25mg  in the am(1/2 of 12.5) then in the pm she is taking 12.5mg , she states that she cannot take a whole tab in the am because it makes her feel too fatigued and has to lay down for awhile so she started taking 1/2. Also she is taking 40mg  lasix every-other-day (she took yesterday) she states that her weight has been 136-137# since her last OV. She states that she had an ablation done at [place] 09-03-16 she states that she called them first and states that they did not seem to care and told her to call here. Please advise.

## 2016-10-02 NOTE — Telephone Encounter (Signed)
Cut back the carvedilol to 6.25 both morning and evening

## 2016-10-02 NOTE — Telephone Encounter (Signed)
Pt.notified

## 2016-10-02 NOTE — Telephone Encounter (Signed)
S/w Dr Croitoru-order limited ECHO for pleural effusion and have pt decrease lisinopril 1/2 10mg .  Pt notified of above will await scheduling. She is not taking the lisinopril, [place] stopped when she had her ablation.

## 2016-10-05 ENCOUNTER — Encounter: Payer: Medicare HMO | Admitting: Cardiology

## 2016-10-05 ENCOUNTER — Ambulatory Visit (HOSPITAL_COMMUNITY): Payer: Medicare HMO | Attending: Cardiology

## 2016-10-05 ENCOUNTER — Other Ambulatory Visit: Payer: Self-pay | Admitting: Cardiovascular Disease

## 2016-10-05 ENCOUNTER — Other Ambulatory Visit: Payer: Self-pay

## 2016-10-05 DIAGNOSIS — Z8249 Family history of ischemic heart disease and other diseases of the circulatory system: Secondary | ICD-10-CM | POA: Insufficient documentation

## 2016-10-05 DIAGNOSIS — R55 Syncope and collapse: Secondary | ICD-10-CM | POA: Diagnosis not present

## 2016-10-05 DIAGNOSIS — I4891 Unspecified atrial fibrillation: Secondary | ICD-10-CM | POA: Diagnosis not present

## 2016-10-05 DIAGNOSIS — F1010 Alcohol abuse, uncomplicated: Secondary | ICD-10-CM | POA: Insufficient documentation

## 2016-10-05 DIAGNOSIS — I252 Old myocardial infarction: Secondary | ICD-10-CM | POA: Diagnosis not present

## 2016-10-05 DIAGNOSIS — E785 Hyperlipidemia, unspecified: Secondary | ICD-10-CM | POA: Insufficient documentation

## 2016-10-05 DIAGNOSIS — I2510 Atherosclerotic heart disease of native coronary artery without angina pectoris: Secondary | ICD-10-CM | POA: Diagnosis not present

## 2016-10-05 DIAGNOSIS — I509 Heart failure, unspecified: Secondary | ICD-10-CM | POA: Diagnosis not present

## 2016-10-05 DIAGNOSIS — N189 Chronic kidney disease, unspecified: Secondary | ICD-10-CM | POA: Insufficient documentation

## 2016-10-05 DIAGNOSIS — J90 Pleural effusion, not elsewhere classified: Secondary | ICD-10-CM

## 2016-10-05 DIAGNOSIS — R69 Illness, unspecified: Secondary | ICD-10-CM | POA: Diagnosis not present

## 2016-10-05 DIAGNOSIS — I130 Hypertensive heart and chronic kidney disease with heart failure and stage 1 through stage 4 chronic kidney disease, or unspecified chronic kidney disease: Secondary | ICD-10-CM | POA: Diagnosis not present

## 2016-10-08 ENCOUNTER — Ambulatory Visit: Payer: Medicare HMO

## 2016-10-08 VITALS — BP 106/64 | HR 88

## 2016-10-08 DIAGNOSIS — I10 Essential (primary) hypertension: Secondary | ICD-10-CM

## 2016-10-08 NOTE — Progress Notes (Signed)
Patient walked in today to discuss recent BPs. Stated she had an episode of chest pain today. Denies use of NTG. Describes it as "bad indigestion." Patient states she had to lay down which provided some relief. Reports no pain at time of visit. Offered EKG, patient refused. Her fiance took her BP at time of episode, 146/110. Check BP at visit, 106/64. Patient states her BP is usually all over the place. Last night, she took her own BP, manually, 90/60. Patient reports no other symptoms.   Provided educational materials regarding the use of NTG.  I advised patient to keep a record of BPs. Patient stated she was going to purchase an automatic blood pressure cuff today. She will return for labs next week.  Advised patient to call the office or seek emergency medical attention if symptoms persisted or worsened.

## 2016-10-08 NOTE — Patient Instructions (Addendum)
Your physician has requested that you regularly monitor your blood pressure at home. Please use the same machine to check your blood pressure daily. Keep a record of your blood pressures using the log sheet provided. In 2 weeks, please report your readings back to Dr C. You may use our online patient portal 'MyChart' or you can call the office to speak with a nurse.  Omron blood pressure cuff  When checking your blood pressure: - Rest for ~15 minutes prior to checking - Make sure both feet are on the floor - do not cross your legs - When you have symptoms and check your blood pressure, note that on the record provided as well  If you have any additional questions, please don't hesitate to call!

## 2016-10-14 DIAGNOSIS — Z79899 Other long term (current) drug therapy: Secondary | ICD-10-CM | POA: Diagnosis not present

## 2016-10-14 DIAGNOSIS — I4892 Unspecified atrial flutter: Secondary | ICD-10-CM | POA: Diagnosis not present

## 2016-10-14 DIAGNOSIS — R0602 Shortness of breath: Secondary | ICD-10-CM | POA: Diagnosis not present

## 2016-10-16 LAB — BASIC METABOLIC PANEL
BUN/Creatinine Ratio: 15 (ref 12–28)
BUN: 13 mg/dL (ref 8–27)
CO2: 24 mmol/L (ref 20–29)
Calcium: 10.7 mg/dL — ABNORMAL HIGH (ref 8.7–10.3)
Chloride: 101 mmol/L (ref 96–106)
Creatinine, Ser: 0.86 mg/dL (ref 0.57–1.00)
GFR calc Af Amer: 76 mL/min/{1.73_m2} (ref >59)
GFR calc non Af Amer: 66 mL/min/{1.73_m2} (ref >59)
Glucose: 91 mg/dL (ref 65–99)
Potassium: 5.3 mmol/L — ABNORMAL HIGH (ref 3.5–5.2)
Sodium: 138 mmol/L (ref 134–144)

## 2016-10-16 LAB — MAGNESIUM: Magnesium: 2.2 mg/dL (ref 1.6–2.3)

## 2016-10-16 LAB — PRO B NATRIURETIC PEPTIDE: NT-Pro BNP: 991 pg/mL — ABNORMAL HIGH (ref 0–738)

## 2016-10-20 DIAGNOSIS — R69 Illness, unspecified: Secondary | ICD-10-CM | POA: Diagnosis not present

## 2016-10-21 ENCOUNTER — Telehealth: Payer: Self-pay | Admitting: Cardiovascular Disease

## 2016-10-21 NOTE — Telephone Encounter (Signed)
Samples at the front desk Pt notified 

## 2016-10-21 NOTE — Telephone Encounter (Signed)
New message    Patient calling the office for samples of medication:   1.  What medication and dosage are you requesting samples for?  eliquis 5mg   2.  Are you currently out of this medication?  3 days left

## 2016-11-12 ENCOUNTER — Ambulatory Visit (INDEPENDENT_AMBULATORY_CARE_PROVIDER_SITE_OTHER): Payer: Medicare HMO | Admitting: *Deleted

## 2016-11-12 DIAGNOSIS — I495 Sick sinus syndrome: Secondary | ICD-10-CM

## 2016-11-12 NOTE — Progress Notes (Signed)
Remote pacemaker transmission.   

## 2016-11-13 ENCOUNTER — Telehealth: Payer: Self-pay | Admitting: Cardiovascular Disease

## 2016-11-13 DIAGNOSIS — R4189 Other symptoms and signs involving cognitive functions and awareness: Secondary | ICD-10-CM

## 2016-11-13 LAB — CUP PACEART REMOTE DEVICE CHECK
Battery Impedance: 326 Ohm
Battery Remaining Longevity: 93 mo
Battery Voltage: 2.79 V
Brady Statistic AP VP Percent: 0 %
Brady Statistic AP VS Percent: 98 %
Brady Statistic AS VP Percent: 0 %
Brady Statistic AS VS Percent: 1 %
Date Time Interrogation Session: 20181003211648
Implantable Lead Implant Date: 20131212
Implantable Lead Implant Date: 20131212
Implantable Lead Location: 753859
Implantable Lead Location: 753860
Implantable Lead Model: 5076
Implantable Lead Model: 5076
Implantable Pulse Generator Implant Date: 20131212
Lead Channel Impedance Value: 378 Ohm
Lead Channel Impedance Value: 433 Ohm
Lead Channel Pacing Threshold Amplitude: 0.75 V
Lead Channel Pacing Threshold Amplitude: 1 V
Lead Channel Pacing Threshold Pulse Width: 0.4 ms
Lead Channel Pacing Threshold Pulse Width: 0.4 ms
Lead Channel Sensing Intrinsic Amplitude: 11.2 mV
Lead Channel Setting Pacing Amplitude: 2 V
Lead Channel Setting Pacing Amplitude: 2.5 V
Lead Channel Setting Pacing Pulse Width: 0.4 ms
Lead Channel Setting Sensing Sensitivity: 5.6 mV

## 2016-11-13 NOTE — Telephone Encounter (Signed)
New message    Pt is calling asking for a call back. She did not say waht it was about.

## 2016-11-13 NOTE — Telephone Encounter (Signed)
New message ° °Patient calling the office for samples of medication: ° ° °1.  What medication and dosage are you requesting samples for?apixaban (ELIQUIS) 5 MG TABS tablet ° °2.  Are you currently out of this medication? yes ° ° ° °

## 2016-11-13 NOTE — Telephone Encounter (Signed)
Spoke with the patient. She stated that lately she has been having trouble with her memory. She is wondering if she may have Alzheimer. She would like to know if Dr. Sallyanne Kuster could recommend anyone. Message routed to the provider for their recommendation.

## 2016-11-13 NOTE — Telephone Encounter (Signed)
No eliquis 5mg  samples available at time of call.   LM with this info and advised to call back if needs Rx

## 2016-11-14 NOTE — Telephone Encounter (Signed)
How about Dr. Floyde Parkins at [facility]. Please refer [name]

## 2016-11-17 NOTE — Telephone Encounter (Signed)
Patient has been made aware of Dr. Victorino December recommendation. Referral has been sent. She verbalized her understanding.

## 2016-11-19 ENCOUNTER — Encounter: Payer: Self-pay | Admitting: Cardiology

## 2016-11-23 ENCOUNTER — Telehealth: Payer: Self-pay | Admitting: Cardiovascular Disease

## 2016-11-23 NOTE — Telephone Encounter (Signed)
No samples are available- can call back Patient verbalized understanding.

## 2016-11-23 NOTE — Telephone Encounter (Signed)
New message    Pt is calling.   Patient calling the office for samples of medication:   1.  What medication and dosage are you requesting samples for? eliquis 5 mg  2.  Are you currently out of this medication? yes

## 2016-11-26 ENCOUNTER — Telehealth: Payer: Self-pay | Admitting: Cardiovascular Disease

## 2016-11-26 NOTE — Telephone Encounter (Signed)
Patient calling the office for samples of medication:   1.  What medication and dosage are you requesting samples for? Eliquis  2.  Are you currently out of this medication? yes    

## 2016-11-26 NOTE — Telephone Encounter (Signed)
Advised patient currently no samples

## 2016-11-27 ENCOUNTER — Telehealth: Payer: Self-pay | Admitting: Cardiovascular Disease

## 2016-11-27 NOTE — Telephone Encounter (Signed)
Patient called and notified that referral was ordered at time of last call regarding this. She is aware that I have sent a staff message to our schedulers to follow up on the status of the referral and that this message was sent to [facility], CMA as well, for follow up. She voiced understanding.

## 2016-11-27 NOTE — Telephone Encounter (Signed)
Pt says she is waiting for her referral to a Neurologist.

## 2016-11-27 NOTE — Telephone Encounter (Signed)
Staff message sent to scheduling pool  Referral ordered on 10/9 per phone note

## 2016-11-30 ENCOUNTER — Other Ambulatory Visit: Payer: Self-pay | Admitting: *Deleted

## 2016-11-30 DIAGNOSIS — I739 Peripheral vascular disease, unspecified: Secondary | ICD-10-CM

## 2016-12-02 ENCOUNTER — Telehealth: Payer: Self-pay | Admitting: Cardiovascular Disease

## 2016-12-02 NOTE — Telephone Encounter (Signed)
New message    Patient calling the office for samples of medication:   1.  What medication and dosage are you requesting samples for?apixaban (ELIQUIS) 5 MG TABS tablet  2.  Are you currently out of this medication? No, enough for 2 more days

## 2016-12-02 NOTE — Telephone Encounter (Signed)
Left message for patient, none available today.

## 2016-12-04 ENCOUNTER — Encounter: Payer: Self-pay | Admitting: Cardiology

## 2016-12-07 MED ORDER — APIXABAN 5 MG PO TABS: 5.0000 mg | ORAL_TABLET | Freq: Two times a day (BID) | ORAL | 0 refills | Status: DC

## 2016-12-07 NOTE — Telephone Encounter (Signed)
CALLED LEFT MESSAGE SAMPLES AVAILABLE

## 2016-12-08 ENCOUNTER — Telehealth: Payer: Self-pay | Admitting: Cardiovascular Disease

## 2016-12-08 NOTE — Telephone Encounter (Signed)
Medication samples have been provided to the patient.  Drug name: Eliquis 5mg    Qty: 14 tablets  LOT: [id number]  Exp.Date: 12/20  Samples left at front desk for patient pick-up. Patient notified.

## 2016-12-08 NOTE — Telephone Encounter (Signed)
Per pt call  ELIQUIS samples please.

## 2016-12-28 ENCOUNTER — Telehealth: Payer: Self-pay | Admitting: Cardiovascular Disease

## 2016-12-28 NOTE — Telephone Encounter (Signed)
Patient calling the office for samples of medication:   1.  What medication and dosage are you requesting samples for? Eliquis 5mg    2.  Are you currently out of this medication? Will be out at the end of this week

## 2016-12-28 NOTE — Telephone Encounter (Signed)
Patient aware none available today

## 2017-01-04 ENCOUNTER — Telehealth: Payer: Self-pay | Admitting: Cardiovascular Disease

## 2017-01-04 DIAGNOSIS — R5382 Chronic fatigue, unspecified: Secondary | ICD-10-CM

## 2017-01-04 NOTE — Telephone Encounter (Signed)
OK to refer [name]

## 2017-01-04 NOTE — Telephone Encounter (Signed)
New Message     Patient would like a referral for physical therapy please.

## 2017-01-04 NOTE — Telephone Encounter (Signed)
Returned the call to the patient. She stated that she would like to have a referral to a physical therapist. She wants to be referred to Barbaraann Barthel, PT.  She has been advised to call her PCP for a physical therapist referral. She stated that she did call the PCP but they have refused to do a referral until she has been seen again by them. She stated that she does not want to go in to been seen at this point and would rather have Dr. Sallyanne Kuster send in a referral instead. Will route to him for further review.

## 2017-01-04 NOTE — Telephone Encounter (Signed)
Left message to call back  

## 2017-01-04 NOTE — Telephone Encounter (Signed)
Patient has been made aware that the referral order has been placed to [facility] per her request.  Samples have been provided as well.  Medication Samples have been provided to the patient.  Drug name: Eliquis        Strength: 5 mg         Qty: 2 boxes   LOT: [id number]   Exp.Date: 3/21

## 2017-01-25 ENCOUNTER — Telehealth: Payer: Self-pay | Admitting: Cardiovascular Disease

## 2017-01-25 NOTE — Telephone Encounter (Signed)
New Message     Patient states that she was told that she can pick up a copy of a physical therapy referral. She wants to know if its available at the office to pick up. She needs it by 12pm today.  Please call

## 2017-01-25 NOTE — Telephone Encounter (Signed)
Spoke with pt, referral information and telephone note with order from dr croitoru for PT placed at the front desk for patient pick up.

## 2017-01-26 DIAGNOSIS — M6281 Muscle weakness (generalized): Secondary | ICD-10-CM | POA: Diagnosis not present

## 2017-01-26 DIAGNOSIS — G894 Chronic pain syndrome: Secondary | ICD-10-CM | POA: Diagnosis not present

## 2017-01-28 ENCOUNTER — Ambulatory Visit (HOSPITAL_COMMUNITY)
Admission: RE | Admit: 2017-01-28 | Discharge: 2017-01-28 | Disposition: A | Discharge status: Home / Self Care | Payer: Medicare HMO | Source: Ambulatory Visit | Attending: Cardiology | Admitting: Cardiology

## 2017-01-28 DIAGNOSIS — I70203 Unspecified atherosclerosis of native arteries of extremities, bilateral legs: Secondary | ICD-10-CM | POA: Insufficient documentation

## 2017-01-28 DIAGNOSIS — I739 Peripheral vascular disease, unspecified: Secondary | ICD-10-CM

## 2017-02-04 ENCOUNTER — Ambulatory Visit (INDEPENDENT_AMBULATORY_CARE_PROVIDER_SITE_OTHER): Payer: Medicare HMO | Admitting: Family Medicine

## 2017-02-04 ENCOUNTER — Telehealth: Payer: Self-pay | Admitting: Cardiovascular Disease

## 2017-02-04 ENCOUNTER — Other Ambulatory Visit: Payer: Self-pay

## 2017-02-04 ENCOUNTER — Encounter: Payer: Self-pay | Admitting: Family Medicine

## 2017-02-04 VITALS — BP 116/78 | HR 84 | Temp 98.1°F | Ht 65.35 in | Wt 151.4 lb

## 2017-02-04 DIAGNOSIS — N3000 Acute cystitis without hematuria: Secondary | ICD-10-CM | POA: Diagnosis not present

## 2017-02-04 DIAGNOSIS — N761 Subacute and chronic vaginitis: Secondary | ICD-10-CM | POA: Diagnosis not present

## 2017-02-04 DIAGNOSIS — R350 Frequency of micturition: Secondary | ICD-10-CM

## 2017-02-04 LAB — POCT URINALYSIS DIP (MANUAL ENTRY)
Glucose, UA: NEGATIVE mg/dL
Ketones, POC UA: NEGATIVE mg/dL
Nitrite, UA: NEGATIVE
Protein Ur, POC: 30 mg/dL — AB
Spec Grav, UA: >=1.03 — AB (ref 1.010–1.025)
Urobilinogen, UA: 0.2 E.U./dL
pH, UA: 5 (ref 5.0–8.0)

## 2017-02-04 NOTE — Telephone Encounter (Signed)
New Message    Patient is calling about results of test that she has last week. Please call.

## 2017-02-04 NOTE — Progress Notes (Signed)
12/27/20182:57 PM  [name] 1940/12/13, 76 y.o. female [id number]  Chief Complaint  Patient presents with  . Sore Throat  . Back Pain    HPI:   Patient is a 76 y.o. female with past medical history significant for CAD, CKD II, prediabetes and PAF who presents today for about a month of urinary frequency with low output and discomfort. She denies any burning pain, blood in her urine, nausea, vomiting, flank pain, fever, chills, dyspareunia or vaginal discharge.   She has chronic back pain, will be starting PT in about 2 weeks  Also having intermittent sore throat, none today, denies any rhinorrhea or nasal congestion.   No decreased cardiac function on echo 09/2016  Depression screen [facility] 2/9 02/04/2017 05/30/2016 02/18/2016  Decreased Interest 0 0 0  Down, Depressed, Hopeless 0 0 0  PHQ - 2 Score 0 0 0  Altered sleeping - - -  Tired, decreased energy - - -  Change in appetite - - -  Feeling bad or failure about yourself  - - -  Trouble concentrating - - -  Moving slowly or fidgety/restless - - -  Suicidal thoughts - - -  PHQ-9 Score - - -  Difficult doing work/chores - - -  Some recent data might be hidden    Allergies  Allergen Reactions  . Brilinta [Ticagrelor] Shortness Of Breath  . Clonidine Derivatives Other (See Comments)    Lowers heart rate  . Atorvastatin Other (See Comments)    Possible cause of fatigue/malaise  . Crestor [Rosuvastatin Calcium] Other (See Comments)    Joint/muscle aches  . Exforge [Amlodipine Besylate-Valsartan] Itching and Rash  . Spironolactone Other (See Comments)    Contraindicated with history of hyperkalemia    Prior to Admission medications   Medication Sig Start Date End Date Taking? Authorizing Provider  alendronate (FOSAMAX) 70 MG tablet Take 1 tablet by mouth once a week. 03/24/16  Yes [provider]  apixaban (ELIQUIS) 5 MG TABS tablet Take 1 tablet (5 mg total) by mouth 2 (two) times daily. 12/07/16  Yes  Croitoru, Mihai, MD  carvedilol (COREG) 12.5 MG tablet Take 0.5 tablets (6.25 mg total) by mouth 2 (two) times daily with a meal. 10/02/16  Yes Croitoru, Mihai, MD  cholecalciferol (VITAMIN D) 1000 UNITS tablet Take 1,000 Units by mouth at bedtime.    Yes [provider]  citalopram (CELEXA) 20 MG tablet Take 0.5 tablets (10 mg total) by mouth daily. Patient taking differently: Take 10 mg by mouth every morning.  12/09/15  Yes Theodis Blaze, MD  Coenzyme Q10 (COQ10 PO) Take 120 mg by mouth at bedtime.    Yes [provider]  dofetilide (TIKOSYN) 250 MCG capsule Take 250 mcg by mouth 2 (two) times daily. AT 9 AM AND 9 PM.   Yes [provider]  furosemide (LASIX) 40 MG tablet Take 1 tablet (40 mg total) by mouth as needed. 08/03/16  Yes Croitoru, Mihai, MD  levothyroxine (SYNTHROID, LEVOTHROID) 25 MCG tablet Take 25 mcg by mouth as directed. 1 Tablet Monday-Thursday 2 x a day Fri Sat Sun 04/03/16  Yes [provider]  magnesium oxide (MAG-OX) 400 MG tablet Take 400 mg by mouth daily.   Yes [provider]  Multiple Vitamin (MULTIVITAMIN) capsule Take 1 capsule by mouth 2 (two) times daily.    Yes [provider]  nitroGLYCERIN (NITROSTAT) 0.4 MG SL tablet Place 1 tablet (0.4 mg total) under the tongue every 5 (five)  minutes x 3 doses as needed for chest pain. 05/15/15  Yes Simmons, Brittainy M, PA-C  Omega-3 Fatty Acids (SUPER OMEGA-3) 1000 MG CAPS Take 1,000 mg by mouth daily.   Yes [provider]  Biotin 5000 MCG TABS Take by mouth.    [provider]  pantoprazole (PROTONIX) 40 MG tablet Take 40 mg by mouth 2 (two) times daily.    [provider]  sucralfate (CARAFATE) 1 g tablet Take 1 g by mouth 4 (four) times daily -  with meals and at bedtime.    [provider]    Past Medical History:  Diagnosis Date  . ANXIETY 12/31/2009  . Aortic insufficiency    a. mod by echo 2017.  Marland Kitchen CAD (coronary artery  disease)    a. s/p CABGx3 and LAA clipping in 12/2014, NSTEMI 05/2015 s/p DES to native RCA and SVG-dRCA; occluded ramus-SVG was treated medically). c. neg nuc 10/2015 at [facility].  . Chronic diastolic CHF (congestive heart failure) ([place])   . Chronic fatigue   . CKD (chronic kidney disease), stage II   . DEPRESSION 12/31/2009  . [place] DISEASE, CERVICAL 12/31/2009  . [place] DISEASE, LUMBAR 12/31/2009  . DIVERTICULITIS, HX OF 12/31/2009  . Essential hypertension   . GLUCOSE INTOLERANCE 12/31/2009  . Habitual alcohol use   . Hypercalcemia   . Hyperkalemia   . Hyperlipidemia 12/31/2009  . MENOPAUSE, EARLY 12/31/2009  . NSTEMI (non-ST elevated myocardial infarction) ([place]) 05/11/2015  . Orthostatic hypotension   . OSTEOARTHRITIS, HAND   . Pacemaker 01/21/2012   MDT Adapta dual chamber  . Paroxysmal atrial flutter ([place])   . Persistent atrial fibrillation ([place])   . Pre-diabetes   . PVD (peripheral vascular disease), hx stents to bil SFAs 02/2010   . Symptomatic sinus bradycardia 01/22/2012  . Syncope    a. 11/2015 ? due to medications.  . Tachy-brady syndrome ([place])    a. s/p MDT PPM 2013.  . Tricuspid regurgitation     Past Surgical History:  Procedure Laterality Date  . AUGMENTATION MAMMAPLASTY  1983  . CARDIAC CATHETERIZATION N/A 01/01/2015   Procedure: Left Heart Cath and Coronary Angiography;  Surgeon: Jettie Booze, MD;  Location: [place] CV LAB;  Service: Cardiovascular;  Laterality: N/A;  . CARDIAC CATHETERIZATION N/A 05/12/2015   Procedure: Left Heart Cath and Coronary Angiography;  Surgeon: Troy Sine, MD;  Location: [place] CV LAB;  Service: Cardiovascular;  Laterality: N/A;  . CARDIAC CATHETERIZATION N/A 05/12/2015   Procedure: Coronary Stent Intervention;  Surgeon: Troy Sine, MD;  Location: [place] CV LAB;  Service: Cardiovascular;  Laterality: N/A;  . CARDIOVERSION N/A 02/01/2015   Procedure: CARDIOVERSION;  Surgeon: Lelon Perla, MD;  Location: [facility]  ENDOSCOPY;  Service: Cardiovascular;  Laterality: N/A;  . CARDIOVERSION N/A 08/30/2015   Procedure: CARDIOVERSION;  Surgeon: Sanda Klein, MD;  Location: [place] ENDOSCOPY;  Service: Cardiovascular;  Laterality: N/A;  . CARPAL TUNNEL RELEASE  2008   "right hand/thumb; carpal tunnel repair; got rid of arthritis" (01/21/2012)  . CLIPPING OF ATRIAL APPENDAGE N/A 01/04/2015   Procedure: CLIPPING OF ATRIAL APPENDAGE;  Surgeon: Grace Isaac, MD;  Location: [place];  Service: Open Heart Surgery;  Laterality: N/A;  . COLONOSCOPY N/A 12/21/2015   Procedure: COLONOSCOPY;  Surgeon: Milus Banister, MD;  Location: [facility] ENDOSCOPY;  Service: Endoscopy;  Laterality: N/A;  . CORONARY ARTERY BYPASS GRAFT N/A 01/04/2015   Procedure: CORONARY ARTERY BYPASS GRAFTING (CABG) x 3 using left internal mammory artery  and greater saphenous vein right leg harvested endoscopically.;  Surgeon: Grace Isaac, MD;  LIMA-LAD, SVG-RI, SVG-PDA  . ESOPHAGOGASTRODUODENOSCOPY (EGD) WITH PROPOFOL N/A 12/18/2015   Procedure: ESOPHAGOGASTRODUODENOSCOPY (EGD) WITH PROPOFOL;  Surgeon: Milus Banister, MD;  Location: [facility] ENDOSCOPY;  Service: Endoscopy;  Laterality: N/A;  . FACELIFT, LOWER 2/3  1995   "mini" (01/21/2012)  . GIVENS CAPSULE STUDY N/A 12/21/2015   Procedure: GIVENS CAPSULE STUDY;  Surgeon: Milus Banister, MD;  Location: [facility] ENDOSCOPY;  Service: Endoscopy;  Laterality: N/A;  . Lower Arterial Examination  10/28/2011   R. SFA stent mild-moderate mixed density plaque with elevated velocities consistent with 50% diameter reduction. L. SFA stent moderate mixed denisty plaque at mid to distal level consistent with 50-69% diameter reduction.  . OOPHORECTOMY  ~1979  . PARTIAL COLECTOMY  2010  . PERIPHERAL ARTERIAL STENT GRAFT  2012; 2012   "LLE; RLE" (01/21/2012)  . PERMANENT PACEMAKER INSERTION N/A 01/21/2012   Medtronic Adapta L implanted by Dr Sallyanne Kuster for tachy/brady syndrome  . POSTERIOR CERVICAL LAMINECTOMY  1985  . TEE  WITHOUT CARDIOVERSION N/A 01/04/2015   Procedure: TRANSESOPHAGEAL ECHOCARDIOGRAM (TEE);  Surgeon: Grace Isaac, MD;  Location: [place];  Service: Open Heart Surgery;  Laterality: N/A;  . TEE WITHOUT CARDIOVERSION N/A 02/01/2015   Procedure: TRANSESOPHAGEAL ECHOCARDIOGRAM (TEE);  Surgeon: Lelon Perla, MD;  Location: [facility] ENDOSCOPY;  Service: Cardiovascular;  Laterality: N/A;  . TEE WITHOUT CARDIOVERSION N/A 08/30/2015   Procedure: TRANSESOPHAGEAL ECHOCARDIOGRAM (TEE);  Surgeon: Sanda Klein, MD;  Location: [facility] ENDOSCOPY;  Service: Cardiovascular;  Laterality: N/A;  . VAGINAL HYSTERECTOMY  1975    Social History   Tobacco Use  . Smoking status: Former Smoker    Packs/day: 0.75    Years: 10.00    Pack years: 7.50    Types: Cigarettes  . Smokeless tobacco: Never Used  . Tobacco comment: 01/21/2012 "quit smoking ~ 2002"  Substance Use Topics  . Alcohol use: Yes    Comment: 01/21/2012 "3 times/wk I have a couple mixed drinks"; 06/28/2015 "nothing in the last 3 weeks cause I haven't felt good", reduced recently    Family History  Problem Relation Age of Onset  . Heart disease Mother   . Hypertension Mother   . Stroke Mother   . Stroke Father   . Heart disease Father   . Heart disease Brother   . Hypertension Brother        2 brothers  . CVA Brother        2 brothers  . Heart attack Brother        2 brothers    ROS Per hpi  OBJECTIVE:  Blood pressure 116/78, pulse 84, temperature 98.1 F (36.7 C), temperature source Oral, height 5' 5.35" (1.66 m), weight 151 lb 6.4 oz (68.7 kg), SpO2 97 %.   Physical Exam  Constitutional: She is oriented to person, place, and time and well-developed, well-nourished, and in no distress.  HENT:  Head: Normocephalic and atraumatic.  Right Ear: Hearing, tympanic membrane, external ear and ear canal normal.  Left Ear: Hearing, tympanic membrane, external ear and ear canal normal.  Mouth/Throat: Oropharynx is clear and moist.  Eyes: EOM  are normal. Pupils are equal, round, and reactive to light.  Neck: Neck supple. No JVD present.  Cardiovascular: Normal rate, regular rhythm and normal heart sounds. Exam reveals no gallop and no friction rub.  No murmur heard. Pulmonary/Chest: Effort normal and breath sounds normal. She has no wheezes. She has no  rales.  Abdominal: Soft. Bowel sounds are normal. She exhibits no distension. There is no tenderness. There is no CVA tenderness.  Musculoskeletal: She exhibits no edema.  Lymphadenopathy:    She has no cervical adenopathy.  Neurological: She is alert and oriented to person, place, and time. Gait normal.  Skin: Skin is warm and dry.     Results for orders placed or performed in visit on 02/04/17 (from the past 24 hour(s))  POCT urinalysis dipstick     Status: Abnormal   Collection Time: 02/04/17  2:54 PM  Result Value Ref Range   Color, UA yellow yellow   Clarity, UA cloudy (A) clear   Glucose, UA negative negative mg/dL   Bilirubin, UA small (A) negative   Ketones, POC UA negative negative mg/dL   Spec Grav, UA >=1.030 (A) 1.010 - 1.025   Blood, UA trace-lysed (A) negative   pH, UA 5.0 5.0 - 8.0   Protein Ur, POC =30 (A) negative mg/dL   Urobilinogen, UA 0.2 0.2 or 1.0 E.U./dL   Nitrite, UA Negative Negative   Leukocytes, UA Small (1+) (A) Negative      ASSESSMENT and PLAN 1. Urinary frequency Discussed increasing fluids, will test as ordered. RTC precautions reviewed.  - POCT urinalysis dipstick - Urine Culture  2. Subacute vaginitis - WET PREP FOR TRICH, YEAST, CLUE - GC/Chlamydia Probe Amp  Return if symptoms worsen or fail to improve.    Rutherford Guys, MD Primary Care at [place], [place] [address] Ph.  [phone] Fax [phone]

## 2017-02-04 NOTE — Patient Instructions (Signed)
     IF you received an x-ray today, you will receive an invoice from [facility]. Please contact [facility] at [phone] with questions or concerns regarding your invoice.   IF you received labwork today, you will receive an invoice from [facility]. Please contact [facility] at [phone] with questions or concerns regarding your invoice.   Our billing staff will not be able to assist you with questions regarding bills from these companies.  You will be contacted with the lab results as soon as they are available. The fastest way to get your results is to activate your My Chart account. Instructions are located on the last page of this paperwork. If you have not heard from us regarding the results in 2 weeks, please contact this office.     

## 2017-02-05 ENCOUNTER — Other Ambulatory Visit: Payer: Self-pay | Admitting: Family Medicine

## 2017-02-05 LAB — WET PREP FOR TRICH, YEAST, CLUE
Clue Cell Exam: NEGATIVE
Trichomonas Exam: NEGATIVE
Yeast Exam: POSITIVE — AB

## 2017-02-05 MED ORDER — FLUCONAZOLE 150 MG PO TABS: 150.0000 mg | ORAL_TABLET | Freq: Once | ORAL | 0 refills | Status: AC

## 2017-02-05 NOTE — Telephone Encounter (Signed)
Patient called w/test results. She is scheduled for PV eval with Dr. Gwenlyn Found on Feb 10, 2017 @ 1130am

## 2017-02-05 NOTE — Telephone Encounter (Signed)
Result Notes for VAS [place] ABI WITH/WO TBI   Notes recorded by Truitt, Dionne Bucy, CMA on 02/04/2017 at 4:29 PM EST lmtcb ------  Notes recorded by Sanda Klein, MD on 01/31/2017 at 2:16 PM EST Bilateral moderate SFA obstruction might explain calf pain when walking, but not symptoms at rest. She has seen Dr. Gwenlyn Found for PAD before. Please offer her a follow up.

## 2017-02-05 NOTE — Telephone Encounter (Signed)
Pt calling,wants to know if  Her doppler results are ready.

## 2017-02-06 LAB — GC/CHLAMYDIA PROBE AMP
Chlamydia trachomatis, NAA: NEGATIVE
Neisseria gonorrhoeae by PCR: NEGATIVE

## 2017-02-07 LAB — URINE CULTURE

## 2017-02-09 ENCOUNTER — Other Ambulatory Visit: Payer: Self-pay | Admitting: Family Medicine

## 2017-02-09 MED ORDER — NITROFURANTOIN MONOHYD MACRO 100 MG PO CAPS: 100.0000 mg | ORAL_CAPSULE | Freq: Two times a day (BID) | ORAL | 0 refills | Status: DC

## 2017-02-10 ENCOUNTER — Ambulatory Visit: Payer: Medicare HMO | Admitting: Cardiovascular Disease

## 2017-02-10 ENCOUNTER — Encounter: Payer: Self-pay | Admitting: Cardiovascular Disease

## 2017-02-10 ENCOUNTER — Telehealth: Payer: Self-pay

## 2017-02-10 VITALS — BP 123/81 | HR 77 | Ht 65.0 in | Wt 150.0 lb

## 2017-02-10 DIAGNOSIS — I739 Peripheral vascular disease, unspecified: Secondary | ICD-10-CM | POA: Diagnosis not present

## 2017-02-10 NOTE — H&P (View-Only) (Signed)
02/10/2017 [name]   1941/01/08  [id number]  Primary Physician Kathyrn Lass, MD Primary Cardiologist: Lorretta Harp MD Lupe Carney, [place]  HPI:  [name] is a 77 y.o. moderately overweight engaged Caucasian female mother of 47, grandmother and 6 grandchildren who was referred by Dr. Sallyanne Kuster for peripheral vascular evaluation because otherwise tolerating claudication. She does have a history of PAF having undergone ablation at [facility] a month ago. She is on Tikosyn and Eliquis  however she has not taken her Eliquis several weeks. She has a history of CABG 3 in 2016 and subsequent stent procedures because of early graft failure. She also has PAD status post right left SFA intervention in a staged fashion in 2012 by myself. Over the last year she's had recurrent right lower extreme claudication with Dopplers to suggest a high-frequency signal in her proximal right SFA.   Current Meds  Medication Sig  . alendronate (FOSAMAX) 70 MG tablet Take 1 tablet by mouth once a week.  Marland Kitchen apixaban (ELIQUIS) 5 MG TABS tablet Take 1 tablet (5 mg total) by mouth 2 (two) times daily.  . Biotin 5000 MCG TABS Take by mouth.  . carvedilol (COREG) 12.5 MG tablet Take 0.5 tablets (6.25 mg total) by mouth 2 (two) times daily with a meal.  . cholecalciferol (VITAMIN D) 1000 UNITS tablet Take 1,000 Units by mouth at bedtime.   . citalopram (CELEXA) 20 MG tablet Take 0.5 tablets (10 mg total) by mouth daily. (Patient taking differently: Take 10 mg by mouth every morning. )  . Coenzyme Q10 (COQ10 PO) Take 120 mg by mouth at bedtime.   . dofetilide (TIKOSYN) 250 MCG capsule Take 250 mcg by mouth 2 (two) times daily. AT 9 AM AND 9 PM.  . furosemide (LASIX) 40 MG tablet Take 1 tablet (40 mg total) by mouth as needed.  Marland Kitchen levothyroxine (SYNTHROID, LEVOTHROID) 25 MCG tablet Take 25 mcg by mouth as directed. 1 Tablet Monday-Thursday 2 x a day Fri Sat Sun  . MAGNESIUM CITRATE PO Take 400 mg by mouth  daily.  . Multiple Vitamin (MULTIVITAMIN) capsule Take 1 capsule by mouth 2 (two) times daily.   . nitrofurantoin, macrocrystal-monohydrate, (MACROBID) 100 MG capsule Take 1 capsule (100 mg total) by mouth 2 (two) times daily.  . nitroGLYCERIN (NITROSTAT) 0.4 MG SL tablet Place 1 tablet (0.4 mg total) under the tongue every 5 (five) minutes x 3 doses as needed for chest pain.  . Omega-3 Fatty Acids (SUPER OMEGA-3) 1000 MG CAPS Take 1,000 mg by mouth daily.     Allergies  Allergen Reactions  . Brilinta [Ticagrelor] Shortness Of Breath  . Clonidine Derivatives Other (See Comments)    Lowers heart rate  . Atorvastatin Other (See Comments)    Possible cause of fatigue/malaise  . Crestor [Rosuvastatin Calcium] Other (See Comments)    Joint/muscle aches  . Exforge [Amlodipine Besylate-Valsartan] Itching and Rash  . Spironolactone Other (See Comments)    Contraindicated with history of hyperkalemia    Social History   Socioeconomic History  . Marital status: Widowed    Spouse name: Not on file  . Number of children: 3  . Years of education: Not on file  . Highest education level: Not on file  Social Needs  . Financial resource strain: Not on file  . Food insecurity - worry: Not on file  . Food insecurity - inability: Not on file  . Transportation needs - medical: Not on  file  . Transportation needs - non-medical: Not on file  Occupational History  . 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 Smoking status: Former Smoker    Packs/day: 0.75    Years: 10.00    Pack years: 7.50    Types: Cigarettes  . Smokeless tobacco: Never Used  . Tobacco comment: 01/21/2012 "quit smoking ~ 2002"  Substance and Sexual Activity  . Alcohol use: Yes    Comment: 01/21/2012 "3 times/wk I have a couple mixed drinks"; 06/28/2015 "nothing in the last 3 weeks cause I haven't felt good", reduced recently  . Drug use: No  . Sexual activity: Not Currently  Other Topics  Concern  . Not on file  Social History Narrative   Lives in [place].  Retired.     Review of Systems: General: negative for chills, fever, night sweats or weight changes.  Cardiovascular: negative for chest pain, dyspnea on exertion, edema, orthopnea, palpitations, paroxysmal nocturnal dyspnea or shortness of breath Dermatological: negative for rash Respiratory: negative for cough or wheezing Urologic: negative for hematuria Abdominal: negative for nausea, vomiting, diarrhea, bright red blood per rectum, melena, or hematemesis Neurologic: negative for visual changes, syncope, or dizziness All other systems reviewed and are otherwise negative except as noted above.    Blood pressure 123/81, pulse 77, height 5\' 5"  (1.651 m), weight 150 lb (68 kg).  General appearance: alert and no distress Neck: no adenopathy, no carotid bruit, no JVD, supple, symmetrical, trachea midline and thyroid not enlarged, symmetric, no tenderness/mass/nodules Lungs: clear to auscultation bilaterally Heart: regular rate and rhythm, S1, S2 normal, no murmur, click, rub or gallop Extremities: extremities normal, atraumatic, no cyanosis or edema Pulses: 2+ and symmetric Skin: Skin color, texture, turgor normal. No rashes or lesions Neurologic: Alert and oriented X 3, normal strength and tone. Normal symmetric reflexes. Normal coordination and gait  EKG atrially paced rhythm at 77. I personally reviewed this EKG.  ASSESSMENT AND PLAN:   PVD (peripheral vascular disease), hx stents to bil SFAs 02/2010 History of PAD status post staged right and left SFA stenting by myself January 2012 . I initially stented her right SFA 02/20/10 and staged her left SFA on 03/10/10. She's had recurrent claudication in her right leg over the last year. Recent Dopplers suggest the high-frequency signal in her proximal right SFA. She wishes to proceed with angiography and potential endovascular therapy for lifestyle limiting  claudication.      Lorretta Harp MD FACP,FACC,FAHA, [facility] 02/10/2017 12:21 PM

## 2017-02-10 NOTE — Telephone Encounter (Signed)
Phone call to [facility] on [address]. Patient has not picked up diflucan or nitrofurantoin yet. Prescription cancelled.  Phone call to [facility]. Spoke with Ronalee Belts. Nitrofurantoin 100mg  and fluconazole 150mg  prescriptions called in.   Phone call to patient to notify her. She states that she is upset it took until today to get prescription to the right place. She states she was pleased with her care from Dr. Pamella Pert, but is upset with this. She states she is willing to give Dr. Pamella Pert another try. Apologized to patient for her experience. Patient ended call.

## 2017-02-10 NOTE — Telephone Encounter (Signed)
Copied from [place] [phone]. Topic: Inquiry >> Feb 08, 2017  1:04 PM Pricilla Handler wrote: Reason for CRM: Patient called stating that the antibiotic that Dr. Pamella Pert was to send in to her pharmacy for Yeast Infection was never sent. Patient states that the medication was to be sent to [place], [place] [phone] (Phone) [phone] (Fax)  Please call patient ASAP.       Thank You!!!  >> Feb 10, 2017  7:31 AM Damita Dunnings, CMA wrote: Wrong office, will forward to [facility].

## 2017-02-10 NOTE — Assessment & Plan Note (Signed)
History of PAD status post staged right and left SFA stenting by myself January 2012 . I initially stented her right SFA 02/20/10 and staged her left SFA on 03/10/10. She's had recurrent claudication in her right leg over the last year. Recent Dopplers suggest the high-frequency signal in her proximal right SFA. She wishes to proceed with angiography and potential endovascular therapy for lifestyle limiting claudication.

## 2017-02-10 NOTE — Patient Instructions (Addendum)
   [place] [address] [place] [address] Dept: [phone] Loc: [place]  02/10/2017  You are scheduled for a Peripheral Angiogram on Monday, January 7 with Dr. Quay Burow.  1. Please arrive at the [facility] (Main Entrance A) at [facility]: [address] [place], [place] [address] at 7:30 AM (two hours before your procedure to ensure your preparation). Free valet parking service is available.   Special note: Every effort is made to have your procedure done on time. Please understand that emergencies sometimes delay scheduled procedures.  2. Diet: Do not eat or drink anything after midnight prior to your procedure except sips of water to take medications.  3. Labs: Please have labs drawn in our office today.  4. Medication instructions in preparation for your procedure:  Hold Eliquis for 2 days prior to procedure.  On the morning of your procedure, take your Aspirin and any morning medicines NOT listed above.  You may use sips of water.  5. Plan for one night stay--bring personal belongings. 6. Bring a current list of your medications and current insurance cards. 7. You MUST have a responsible person to drive you home. 8. Someone MUST be with you the first 24 hours after you arrive home or your discharge will be delayed. 9. Please wear clothes that are easy to get on and off and wear slip-on shoes.  Thank you for allowing [place] to care for you!   -- [facility]    Post procedure follow-up: 1 WEEK AFTER PROCEDURE: Your physician has requested that you have a lower extremity arterial duplex. During this test, ultrasound is used to evaluate arterial blood flow in the legs. Allow one hour for this exam. There are no restrictions or special instructions.  Your physician has requested that you have an ankle brachial index  (ABI). During this test an ultrasound and blood pressure cuff are used to evaluate the arteries that supply the arms and legs with blood. Allow thirty minutes for this exam. There are no restrictions or special instructions.  Your physician recommends that you schedule a follow-up appointment in: 2 weeks after procedure with Dr. Gwenlyn Found.

## 2017-02-10 NOTE — Progress Notes (Signed)
02/10/2017 [name]   22-Nov-1940  [id number]  Primary Physician Kathyrn Lass, MD Primary Cardiologist: Lorretta Harp MD Lupe Carney, [place]  HPI:  [name] is a 77 y.o. moderately overweight engaged Caucasian female mother of 61, grandmother and 6 grandchildren who was referred by Dr. Sallyanne Kuster for peripheral vascular evaluation because otherwise tolerating claudication. She does have a history of PAF having undergone ablation at [facility] a month ago. She is on Tikosyn and Eliquis  however she has not taken her Eliquis several weeks. She has a history of CABG 3 in 2016 and subsequent stent procedures because of early graft failure. She also has PAD status post right left SFA intervention in a staged fashion in 2012 by myself. Over the last year she's had recurrent right lower extreme claudication with Dopplers to suggest a high-frequency signal in her proximal right SFA.   Current Meds  Medication Sig  . alendronate (FOSAMAX) 70 MG tablet Take 1 tablet by mouth once a week.  Marland Kitchen apixaban (ELIQUIS) 5 MG TABS tablet Take 1 tablet (5 mg total) by mouth 2 (two) times daily.  . Biotin 5000 MCG TABS Take by mouth.  . carvedilol (COREG) 12.5 MG tablet Take 0.5 tablets (6.25 mg total) by mouth 2 (two) times daily with a meal.  . cholecalciferol (VITAMIN D) 1000 UNITS tablet Take 1,000 Units by mouth at bedtime.   . citalopram (CELEXA) 20 MG tablet Take 0.5 tablets (10 mg total) by mouth daily. (Patient taking differently: Take 10 mg by mouth every morning. )  . Coenzyme Q10 (COQ10 PO) Take 120 mg by mouth at bedtime.   . dofetilide (TIKOSYN) 250 MCG capsule Take 250 mcg by mouth 2 (two) times daily. AT 9 AM AND 9 PM.  . furosemide (LASIX) 40 MG tablet Take 1 tablet (40 mg total) by mouth as needed.  Marland Kitchen levothyroxine (SYNTHROID, LEVOTHROID) 25 MCG tablet Take 25 mcg by mouth as directed. 1 Tablet Monday-Thursday 2 x a day Fri Sat Sun  . MAGNESIUM CITRATE PO Take 400 mg by mouth  daily.  . Multiple Vitamin (MULTIVITAMIN) capsule Take 1 capsule by mouth 2 (two) times daily.   . nitrofurantoin, macrocrystal-monohydrate, (MACROBID) 100 MG capsule Take 1 capsule (100 mg total) by mouth 2 (two) times daily.  . nitroGLYCERIN (NITROSTAT) 0.4 MG SL tablet Place 1 tablet (0.4 mg total) under the tongue every 5 (five) minutes x 3 doses as needed for chest pain.  . Omega-3 Fatty Acids (SUPER OMEGA-3) 1000 MG CAPS Take 1,000 mg by mouth daily.     Allergies  Allergen Reactions  . Brilinta [Ticagrelor] Shortness Of Breath  . Clonidine Derivatives Other (See Comments)    Lowers heart rate  . Atorvastatin Other (See Comments)    Possible cause of fatigue/malaise  . Crestor [Rosuvastatin Calcium] Other (See Comments)    Joint/muscle aches  . Exforge [Amlodipine Besylate-Valsartan] Itching and Rash  . Spironolactone Other (See Comments)    Contraindicated with history of hyperkalemia    Social History   Socioeconomic History  . Marital status: Widowed    Spouse name: Not on file  . Number of children: 3  . Years of education: Not on file  . Highest education level: Not on file  Social Needs  . Financial resource strain: Not on file  . Food insecurity - worry: Not on file  . Food insecurity - inability: Not on file  . Transportation needs - medical: Not on  file  . Transportation needs - non-medical: Not on file  Occupational History  . 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 Smoking status: Former Smoker    Packs/day: 0.75    Years: 10.00    Pack years: 7.50    Types: Cigarettes  . Smokeless tobacco: Never Used  . Tobacco comment: 01/21/2012 "quit smoking ~ 2002"  Substance and Sexual Activity  . Alcohol use: Yes    Comment: 01/21/2012 "3 times/wk I have a couple mixed drinks"; 06/28/2015 "nothing in the last 3 weeks cause I haven't felt good", reduced recently  . Drug use: No  . Sexual activity: Not Currently  Other Topics  Concern  . Not on file  Social History Narrative   Lives in [place].  Retired.     Review of Systems: General: negative for chills, fever, night sweats or weight changes.  Cardiovascular: negative for chest pain, dyspnea on exertion, edema, orthopnea, palpitations, paroxysmal nocturnal dyspnea or shortness of breath Dermatological: negative for rash Respiratory: negative for cough or wheezing Urologic: negative for hematuria Abdominal: negative for nausea, vomiting, diarrhea, bright red blood per rectum, melena, or hematemesis Neurologic: negative for visual changes, syncope, or dizziness All other systems reviewed and are otherwise negative except as noted above.    Blood pressure 123/81, pulse 77, height 5\' 5"  (1.651 m), weight 150 lb (68 kg).  General appearance: alert and no distress Neck: no adenopathy, no carotid bruit, no JVD, supple, symmetrical, trachea midline and thyroid not enlarged, symmetric, no tenderness/mass/nodules Lungs: clear to auscultation bilaterally Heart: regular rate and rhythm, S1, S2 normal, no murmur, click, rub or gallop Extremities: extremities normal, atraumatic, no cyanosis or edema Pulses: 2+ and symmetric Skin: Skin color, texture, turgor normal. No rashes or lesions Neurologic: Alert and oriented X 3, normal strength and tone. Normal symmetric reflexes. Normal coordination and gait  EKG atrially paced rhythm at 77. I personally reviewed this EKG.  ASSESSMENT AND PLAN:   PVD (peripheral vascular disease), hx stents to bil SFAs 02/2010 History of PAD status post staged right and left SFA stenting by myself January 2012 . I initially stented her right SFA 02/20/10 and staged her left SFA on 03/10/10. She's had recurrent claudication in her right leg over the last year. Recent Dopplers suggest the high-frequency signal in her proximal right SFA. She wishes to proceed with angiography and potential endovascular therapy for lifestyle limiting  claudication.      Lorretta Harp MD FACP,FACC,FAHA, [place] 02/10/2017 12:21 PM

## 2017-02-10 NOTE — Addendum Note (Signed)
Addended by: Therisa Doyne on: 02/10/2017 12:28 PM   Modules accepted: Orders

## 2017-02-11 ENCOUNTER — Ambulatory Visit (INDEPENDENT_AMBULATORY_CARE_PROVIDER_SITE_OTHER): Payer: Medicare HMO | Admitting: *Deleted

## 2017-02-11 ENCOUNTER — Telehealth: Payer: Self-pay | Admitting: Cardiology

## 2017-02-11 DIAGNOSIS — I495 Sick sinus syndrome: Secondary | ICD-10-CM | POA: Diagnosis not present

## 2017-02-11 LAB — BASIC METABOLIC PANEL
BUN/Creatinine Ratio: 15 (ref 12–28)
BUN: 14 mg/dL (ref 8–27)
CO2: 20 mmol/L (ref 20–29)
Calcium: 11.7 mg/dL — ABNORMAL HIGH (ref 8.7–10.3)
Chloride: 100 mmol/L (ref 96–106)
Creatinine, Ser: 0.96 mg/dL (ref 0.57–1.00)
GFR calc Af Amer: 66 mL/min/{1.73_m2} (ref >59)
GFR calc non Af Amer: 58 mL/min/{1.73_m2} — ABNORMAL LOW (ref >59)
Glucose: 110 mg/dL — ABNORMAL HIGH (ref 65–99)
Potassium: 5.2 mmol/L (ref 3.5–5.2)
Sodium: 139 mmol/L (ref 134–144)

## 2017-02-11 LAB — CBC WITH DIFFERENTIAL/PLATELET
Basophils Absolute: 0 10*3/uL (ref 0.0–0.2)
Basos: 1 %
EOS (ABSOLUTE): 0.2 10*3/uL (ref 0.0–0.4)
Eos: 3 %
Hematocrit: 43.8 % (ref 34.0–46.6)
Hemoglobin: 14.9 g/dL (ref 11.1–15.9)
Immature Grans (Abs): 0 10*3/uL (ref 0.0–0.1)
Immature Granulocytes: 0 %
Lymphocytes Absolute: 1.9 10*3/uL (ref 0.7–3.1)
Lymphs: 35 %
MCH: 31.4 pg (ref 26.6–33.0)
MCHC: 34 g/dL (ref 31.5–35.7)
MCV: 92 fL (ref 79–97)
Monocytes Absolute: 0.6 10*3/uL (ref 0.1–0.9)
Monocytes: 11 %
Neutrophils Absolute: 2.7 10*3/uL (ref 1.4–7.0)
Neutrophils: 50 %
Platelets: 217 10*3/uL (ref 150–379)
RBC: 4.75 x10E6/uL (ref 3.77–5.28)
RDW: 14 % (ref 12.3–15.4)
WBC: 5.4 10*3/uL (ref 3.4–10.8)

## 2017-02-11 LAB — APTT: aPTT: 23 s — ABNORMAL LOW (ref 24–33)

## 2017-02-11 LAB — PROTIME-INR
INR: 1 (ref 0.8–1.2)
Prothrombin Time: 10.8 s (ref 9.1–12.0)

## 2017-02-11 NOTE — Telephone Encounter (Signed)
noted 

## 2017-02-11 NOTE — Telephone Encounter (Signed)
Spoke with pt and reminded pt of remote transmission that is due today. Pt verbalized understanding.   

## 2017-02-12 ENCOUNTER — Encounter: Payer: Self-pay | Admitting: Cardiology

## 2017-02-12 ENCOUNTER — Other Ambulatory Visit: Payer: Self-pay | Admitting: Cardiovascular Disease

## 2017-02-12 DIAGNOSIS — I739 Peripheral vascular disease, unspecified: Secondary | ICD-10-CM

## 2017-02-12 NOTE — Progress Notes (Signed)
Remote pacemaker transmission.   

## 2017-02-12 NOTE — Progress Notes (Signed)
Thank you, Fayette Pho

## 2017-02-15 ENCOUNTER — Other Ambulatory Visit: Payer: Self-pay

## 2017-02-15 ENCOUNTER — Ambulatory Visit (HOSPITAL_COMMUNITY)
Admission: RE | Admit: 2017-02-15 | Discharge: 2017-02-16 | Disposition: A | Discharge status: Home / Self Care | Payer: Medicare HMO | Source: Ambulatory Visit | Attending: Cardiovascular Disease | Admitting: Cardiovascular Disease

## 2017-02-15 ENCOUNTER — Encounter (HOSPITAL_COMMUNITY)
Admission: RE | Disposition: A | Discharge status: Home / Self Care | Payer: Self-pay | Source: Ambulatory Visit | Attending: Cardiovascular Disease

## 2017-02-15 ENCOUNTER — Encounter (HOSPITAL_COMMUNITY): Payer: Self-pay | Admitting: Cardiovascular Disease

## 2017-02-15 DIAGNOSIS — I480 Paroxysmal atrial fibrillation: Secondary | ICD-10-CM | POA: Diagnosis not present

## 2017-02-15 DIAGNOSIS — E663 Overweight: Secondary | ICD-10-CM | POA: Insufficient documentation

## 2017-02-15 DIAGNOSIS — Z87891 Personal history of nicotine dependence: Secondary | ICD-10-CM | POA: Diagnosis not present

## 2017-02-15 DIAGNOSIS — Z79899 Other long term (current) drug therapy: Secondary | ICD-10-CM | POA: Insufficient documentation

## 2017-02-15 DIAGNOSIS — I70211 Atherosclerosis of native arteries of extremities with intermittent claudication, right leg: Secondary | ICD-10-CM | POA: Diagnosis not present

## 2017-02-15 DIAGNOSIS — Z888 Allergy status to other drugs, medicaments and biological substances status: Secondary | ICD-10-CM | POA: Insufficient documentation

## 2017-02-15 DIAGNOSIS — I739 Peripheral vascular disease, unspecified: Secondary | ICD-10-CM | POA: Diagnosis present

## 2017-02-15 DIAGNOSIS — Z951 Presence of aortocoronary bypass graft: Secondary | ICD-10-CM | POA: Diagnosis not present

## 2017-02-15 DIAGNOSIS — Z7902 Long term (current) use of antithrombotics/antiplatelets: Secondary | ICD-10-CM | POA: Diagnosis not present

## 2017-02-15 HISTORY — PX: PERIPHERAL VASCULAR BALLOON ANGIOPLASTY: CATH118281

## 2017-02-15 HISTORY — PX: LOWER EXTREMITY ANGIOGRAPHY: CATH118251

## 2017-02-15 HISTORY — PX: ABDOMINAL AORTOGRAM W/LOWER EXTREMITY: CATH118223

## 2017-02-15 LAB — POCT ACTIVATED CLOTTING TIME
Activated Clotting Time: 241 seconds
Activated Clotting Time: 268 seconds
Activated Clotting Time: 164 seconds
Activated Clotting Time: 180 seconds

## 2017-02-15 SURGERY — LOWER EXTREMITY ANGIOGRAPHY
Anesthesia: LOCAL | Laterality: Right

## 2017-02-15 MED ORDER — NITROFURANTOIN MONOHYD MACRO 100 MG PO CAPS
100.0000 mg | ORAL_CAPSULE | Freq: Two times a day (BID) | ORAL | Status: DC
  Administered 2017-02-15: 21:00:00 100 mg via ORAL
  Filled 2017-02-15 (×2): qty 1

## 2017-02-15 MED ORDER — LEVOTHYROXINE SODIUM 25 MCG PO TABS: 25.0000 ug | ORAL_TABLET | Freq: Every day | ORAL | Status: DC

## 2017-02-15 MED ORDER — ASPIRIN EC 81 MG PO TBEC: 81.0000 mg | DELAYED_RELEASE_TABLET | Freq: Every day | ORAL | Status: DC

## 2017-02-15 MED ORDER — ASPIRIN EC 325 MG PO TBEC: 325.0000 mg | DELAYED_RELEASE_TABLET | Freq: Every day | ORAL | Status: DC

## 2017-02-15 MED ORDER — SODIUM CHLORIDE 0.9 % IV SOLN
INTRAVENOUS | Status: AC
  Administered 2017-02-15 (×2): 250 mL via INTRAVENOUS

## 2017-02-15 MED ORDER — CLOPIDOGREL BISULFATE 300 MG PO TABS
ORAL_TABLET | ORAL | Status: AC
  Filled 2017-02-15: qty 1

## 2017-02-15 MED ORDER — HYDRALAZINE HCL 20 MG/ML IJ SOLN
INTRAMUSCULAR | Status: AC
  Filled 2017-02-15: qty 1

## 2017-02-15 MED ORDER — LEVOTHYROXINE SODIUM 50 MCG PO TABS: 50.0000 ug | ORAL_TABLET | ORAL | Status: DC

## 2017-02-15 MED ORDER — ONDANSETRON HCL 4 MG/2ML IJ SOLN: 4.0000 mg | Freq: Four times a day (QID) | INTRAMUSCULAR | Status: DC | PRN

## 2017-02-15 MED ORDER — MIDAZOLAM HCL 2 MG/2ML IJ SOLN
INTRAMUSCULAR | Status: AC
  Filled 2017-02-15: qty 2

## 2017-02-15 MED ORDER — ACETAMINOPHEN 325 MG PO TABS: 650.0000 mg | ORAL_TABLET | ORAL | Status: DC | PRN

## 2017-02-15 MED ORDER — ASPIRIN 81 MG PO CHEW: 81.0000 mg | CHEWABLE_TABLET | ORAL | Status: DC

## 2017-02-15 MED ORDER — CITALOPRAM HYDROBROMIDE 20 MG PO TABS: 10.0000 mg | ORAL_TABLET | Freq: Every day | ORAL | Status: DC

## 2017-02-15 MED ORDER — IODIXANOL 320 MG/ML IV SOLN
INTRAVENOUS | Status: DC | PRN
  Administered 2017-02-15: 170 mL via INTRAVENOUS

## 2017-02-15 MED ORDER — SODIUM CHLORIDE 0.9% FLUSH: 3.0000 mL | INTRAVENOUS | Status: DC | PRN

## 2017-02-15 MED ORDER — HEPARIN SODIUM (PORCINE) 1000 UNIT/ML IJ SOLN
INTRAMUSCULAR | Status: DC | PRN
  Administered 2017-02-15: 7000 [IU] via INTRAVENOUS

## 2017-02-15 MED ORDER — HYDRALAZINE HCL 20 MG/ML IJ SOLN
INTRAMUSCULAR | Status: DC | PRN
  Administered 2017-02-15: 10 mg via INTRAVENOUS

## 2017-02-15 MED ORDER — SODIUM CHLORIDE 0.9% FLUSH
3.0000 mL | Freq: Two times a day (BID) | INTRAVENOUS | Status: DC
  Administered 2017-02-15: 16:00:00 3 mL via INTRAVENOUS

## 2017-02-15 MED ORDER — SODIUM CHLORIDE 0.9 % IV SOLN: 250.0000 mL | INTRAVENOUS | Status: DC | PRN

## 2017-02-15 MED ORDER — DIAZEPAM 5 MG PO TABS
ORAL_TABLET | ORAL | Status: AC
  Filled 2017-02-15: qty 1

## 2017-02-15 MED ORDER — HEPARIN (PORCINE) IN NACL 2-0.9 UNIT/ML-% IJ SOLN
INTRAMUSCULAR | Status: AC
  Filled 2017-02-15: qty 1000

## 2017-02-15 MED ORDER — FAMOTIDINE IN NACL 20-0.9 MG/50ML-% IV SOLN
20.0000 mg | Freq: Once | INTRAVENOUS | Status: DC
  Filled 2017-02-15: qty 50

## 2017-02-15 MED ORDER — FUROSEMIDE 40 MG PO TABS: 40.0000 mg | ORAL_TABLET | Freq: Every day | ORAL | Status: DC

## 2017-02-15 MED ORDER — CLOPIDOGREL BISULFATE 75 MG PO TABS
75.0000 mg | ORAL_TABLET | Freq: Every day | ORAL | Status: DC
  Administered 2017-02-16: 75 mg via ORAL
  Filled 2017-02-15: qty 1

## 2017-02-15 MED ORDER — CARVEDILOL 12.5 MG PO TABS
12.5000 mg | ORAL_TABLET | Freq: Two times a day (BID) | ORAL | Status: DC
  Administered 2017-02-15 – 2017-02-16 (×2): 12.5 mg via ORAL
  Filled 2017-02-15 (×3): qty 1

## 2017-02-15 MED ORDER — MIDAZOLAM HCL 2 MG/2ML IJ SOLN
INTRAMUSCULAR | Status: DC | PRN
  Administered 2017-02-15: 1 mg via INTRAVENOUS

## 2017-02-15 MED ORDER — DIAZEPAM 5 MG PO TABS
2.5000 mg | ORAL_TABLET | Freq: Once | ORAL | Status: DC
  Filled 2017-02-15: qty 1

## 2017-02-15 MED ORDER — NITROGLYCERIN 0.4 MG SL SUBL: 0.4000 mg | SUBLINGUAL_TABLET | SUBLINGUAL | Status: DC | PRN

## 2017-02-15 MED ORDER — LIDOCAINE HCL (PF) 1 % IJ SOLN
INTRAMUSCULAR | Status: AC
  Filled 2017-02-15: qty 30

## 2017-02-15 MED ORDER — SODIUM CHLORIDE 0.9 % WEIGHT BASED INFUSION: 1.0000 mL/kg/h | INTRAVENOUS | Status: DC

## 2017-02-15 MED ORDER — HEPARIN (PORCINE) IN NACL 2-0.9 UNIT/ML-% IJ SOLN
INTRAMUSCULAR | Status: AC | PRN
  Administered 2017-02-15: 1000 mL

## 2017-02-15 MED ORDER — FENTANYL CITRATE (PF) 100 MCG/2ML IJ SOLN
INTRAMUSCULAR | Status: DC | PRN
  Administered 2017-02-15: 25 ug via INTRAVENOUS

## 2017-02-15 MED ORDER — DOFETILIDE 250 MCG PO CAPS
250.0000 ug | ORAL_CAPSULE | Freq: Two times a day (BID) | ORAL | Status: DC
  Administered 2017-02-15 – 2017-02-16 (×2): 250 ug via ORAL
  Filled 2017-02-15 (×2): qty 1

## 2017-02-15 MED ORDER — FENTANYL CITRATE (PF) 100 MCG/2ML IJ SOLN
INTRAMUSCULAR | Status: AC
  Filled 2017-02-15: qty 2

## 2017-02-15 MED ORDER — LIDOCAINE HCL (PF) 1 % IJ SOLN
INTRAMUSCULAR | Status: DC | PRN
  Administered 2017-02-15: 20 mL

## 2017-02-15 MED ORDER — CLOPIDOGREL BISULFATE 300 MG PO TABS
ORAL_TABLET | ORAL | Status: DC | PRN
  Administered 2017-02-15: 300 mg via ORAL

## 2017-02-15 MED ORDER — ANGIOPLASTY BOOK
Freq: Once | Status: AC
  Administered 2017-02-15: 1
  Filled 2017-02-15: qty 1

## 2017-02-15 MED ORDER — SODIUM CHLORIDE 0.9 % WEIGHT BASED INFUSION
3.0000 mL/kg/h | INTRAVENOUS | Status: DC
  Administered 2017-02-15: 3 mL/kg/h via INTRAVENOUS

## 2017-02-15 MED ORDER — LABETALOL HCL 5 MG/ML IV SOLN
10.0000 mg | INTRAVENOUS | Status: AC | PRN
  Administered 2017-02-15 (×4): 10 mg via INTRAVENOUS
  Filled 2017-02-15 (×2): qty 4

## 2017-02-15 MED ORDER — HYDRALAZINE HCL 20 MG/ML IJ SOLN
5.0000 mg | INTRAMUSCULAR | Status: DC | PRN
  Filled 2017-02-15: qty 1

## 2017-02-15 MED ORDER — LEVOTHYROXINE SODIUM 25 MCG PO TABS
25.0000 ug | ORAL_TABLET | ORAL | Status: DC
  Administered 2017-02-16: 06:00:00 25 ug via ORAL
  Filled 2017-02-15: qty 1

## 2017-02-15 MED ORDER — MORPHINE SULFATE (PF) 4 MG/ML IV SOLN: 2.0000 mg | INTRAVENOUS | Status: DC | PRN

## 2017-02-15 MED ORDER — DIAZEPAM 2 MG PO TABS: 2.0000 mg | ORAL_TABLET | Freq: Once | ORAL | Status: DC

## 2017-02-15 MED ORDER — HEPARIN SODIUM (PORCINE) 1000 UNIT/ML IJ SOLN
INTRAMUSCULAR | Status: AC
  Filled 2017-02-15: qty 1

## 2017-02-15 SURGICAL SUPPLY — 19 items
BAG SNAP BAND KOVER 36X36 (MISCELLANEOUS) ×4 IMPLANT
BALLN IN.PACT DCB 5X40 (BALLOONS) ×4
CATH ANGIO 5F PIGTAIL 65CM (CATHETERS) ×4 IMPLANT
CATH HAWKONE LS STANDARD TIP (CATHETERS) ×4
CATH HAWKONE LS STD TIP (CATHETERS) ×3 IMPLANT
CATH TEMPO 5F RIM 65CM (CATHETERS) ×4 IMPLANT
DCB IN.PACT 5X40 (BALLOONS) ×3 IMPLANT
DEVICE CONTINUOUS FLUSH (MISCELLANEOUS) ×4 IMPLANT
KIT ENCORE 26 ADVANTAGE (KITS) ×4 IMPLANT
KIT PV (KITS) ×4 IMPLANT
SHEATH HIGHFLEX ANSEL 7FR 55CM (SHEATH) ×4 IMPLANT
SHEATH PINNACLE 5F 10CM (SHEATH) ×4 IMPLANT
SHEATH PINNACLE 7F 10CM (SHEATH) ×4 IMPLANT
SYR MEDRAD MARK V 150ML (SYRINGE) ×4 IMPLANT
TAPE RADIOPAQUE TURBO (MISCELLANEOUS) ×4 IMPLANT
TRANSDUCER W/STOPCOCK (MISCELLANEOUS) ×4 IMPLANT
TRAY PV CATH (CUSTOM PROCEDURE TRAY) ×4 IMPLANT
WIRE HITORQ VERSACORE ST 145CM (WIRE) ×4 IMPLANT
WIRE SPARTACORE .014X300CM (WIRE) ×4 IMPLANT

## 2017-02-15 NOTE — Progress Notes (Signed)
After tilting bed up some patient states her indigestion is gone at this time.

## 2017-02-15 NOTE — Progress Notes (Signed)
Site area: left groin  Site Prior to Removal:  Level 0  Pressure Applied For 20 MINUTES    Minutes Beginning at 1250  Manual:   Yes.    Patient Status During Pull:  Patient remained A&O by four. Unlabored breathing. No further complaints of indigestion voiced.   Post Pull Groin Site:  Level 0  Post Pull Instructions Given:  Yes.    Post Pull Pulses Present:  Yes.    Dressing Applied:  Yes.    Comments:  Patient refused Pepcid for indigestion, stated that she felt better and did not need it. Post removal instructions provided, teach back effective. Dressing applied to left groin in place C/D/I, soft to touch, no bleeding, no hematoma noted. Visitors at bedside.

## 2017-02-15 NOTE — Progress Notes (Signed)
At 1508 patient's BP=85/48, 250 cc of NS bolus started and Dr Gwenlyn Found informed. States he will send his PA up to see patient. 250 cc NS bolus effective initially, BP=104/46 at 1514, at 1525 BP=98/39, second 250 cc NS Bolus infusing, 1535 BP=103/44, at 1545 BP=120/45. Patient remains asymptomatic, resting with eyes closed, easy to arouse. Will continue to monitor and awaiting PA.

## 2017-02-15 NOTE — Progress Notes (Signed)
Post second 250 cc NS bolus, Patient's BP=120/48.

## 2017-02-15 NOTE — Progress Notes (Signed)
Patient arrived to floor at approximally 1053 and states "I have indigestion, I think a sip of water will help. That pill may not have went down." Rosann Auerbach RN at bedside will inform Dr Gwenlyn Found, sip of water provided. Patient states little effectiveness. Dr Gwenlyn Found paged at this time and verbal order for 20 mg of Pepcid IV given and he states "It is probably from the Plavix she was given." Will medicate and continue to monitor. NO other S/S of distress noted or complaints voiced at this time.

## 2017-02-15 NOTE — Interval H&P Note (Signed)
History and Physical Interval Note:  02/15/2017 9:32 AM  [name]  has presented today for surgery, with the diagnosis of claudication  The various methods of treatment have been discussed with the patient and family. After consideration of risks, benefits and other options for treatment, the patient has consented to  Procedure(s): LOWER EXTREMITY ANGIOGRAPHY (N/A) as a surgical intervention .  The patient's history has been reviewed, patient examined, no change in status, stable for surgery.  I have reviewed the patient's chart and labs.  Questions were answered to the patient's satisfaction.     Quay Burow

## 2017-02-16 ENCOUNTER — Other Ambulatory Visit: Payer: Self-pay | Admitting: Cardiovascular Disease

## 2017-02-16 DIAGNOSIS — I739 Peripheral vascular disease, unspecified: Secondary | ICD-10-CM

## 2017-02-16 DIAGNOSIS — Z87891 Personal history of nicotine dependence: Secondary | ICD-10-CM | POA: Diagnosis not present

## 2017-02-16 DIAGNOSIS — E663 Overweight: Secondary | ICD-10-CM | POA: Diagnosis not present

## 2017-02-16 DIAGNOSIS — I480 Paroxysmal atrial fibrillation: Secondary | ICD-10-CM | POA: Diagnosis not present

## 2017-02-16 DIAGNOSIS — Z951 Presence of aortocoronary bypass graft: Secondary | ICD-10-CM | POA: Diagnosis not present

## 2017-02-16 DIAGNOSIS — Z7902 Long term (current) use of antithrombotics/antiplatelets: Secondary | ICD-10-CM | POA: Diagnosis not present

## 2017-02-16 DIAGNOSIS — Z79899 Other long term (current) drug therapy: Secondary | ICD-10-CM | POA: Diagnosis not present

## 2017-02-16 DIAGNOSIS — I70211 Atherosclerosis of native arteries of extremities with intermittent claudication, right leg: Secondary | ICD-10-CM | POA: Diagnosis not present

## 2017-02-16 DIAGNOSIS — Z888 Allergy status to other drugs, medicaments and biological substances status: Secondary | ICD-10-CM | POA: Diagnosis not present

## 2017-02-16 LAB — CBC
HCT: 39.7 % (ref 36.0–46.0)
Hemoglobin: 12.8 g/dL (ref 12.0–15.0)
MCH: 30 pg (ref 26.0–34.0)
MCHC: 32.2 g/dL (ref 30.0–36.0)
MCV: 93 fL (ref 78.0–100.0)
Platelets: 165 10*3/uL (ref 150–400)
RBC: 4.27 MIL/uL (ref 3.87–5.11)
RDW: 13.5 % (ref 11.5–15.5)
WBC: 4.7 10*3/uL (ref 4.0–10.5)

## 2017-02-16 LAB — BASIC METABOLIC PANEL
Anion gap: 6 (ref 5–15)
BUN: 11 mg/dL (ref 6–20)
CO2: 26 mmol/L (ref 22–32)
Calcium: 9.9 mg/dL (ref 8.9–10.3)
Chloride: 105 mmol/L (ref 101–111)
Creatinine, Ser: 0.89 mg/dL (ref 0.44–1.00)
GFR calc Af Amer: >60 mL/min (ref >60)
GFR calc non Af Amer: >60 mL/min (ref >60)
Glucose, Bld: 100 mg/dL — ABNORMAL HIGH (ref 65–99)
Potassium: 4.8 mmol/L (ref 3.5–5.1)
Sodium: 137 mmol/L (ref 135–145)

## 2017-02-16 MED ORDER — ASPIRIN 81 MG PO TBEC: 81.0000 mg | DELAYED_RELEASE_TABLET | Freq: Every day | ORAL | Status: DC

## 2017-02-16 MED ORDER — APIXABAN 5 MG PO TABS: 5.0000 mg | ORAL_TABLET | Freq: Two times a day (BID) | ORAL | 3 refills | Status: DC

## 2017-02-16 MED ORDER — CLOPIDOGREL BISULFATE 75 MG PO TABS: 75.0000 mg | ORAL_TABLET | Freq: Every day | ORAL | 0 refills | Status: DC

## 2017-02-16 NOTE — Telephone Encounter (Signed)
Patient calling the office for samples of medication: ° ° °1.  What medication and dosage are you requesting samples for? Eliquis 5 mg  ° °2.  Are you currently out of this medication? yes ° ° °

## 2017-02-16 NOTE — Telephone Encounter (Signed)
Medication samples have been provided to the patient.  Drug name: Eliquis 5mg   Qty: 14 tablets    LOT: [id number]  Exp.Date: 03/21  Samples left at front desk for patient pick-up. Patient notified.

## 2017-02-16 NOTE — Discharge Summary (Signed)
Discharge Summary    Patient ID: [name],  MRN: [id number], DOB/AGE: [birth year] 77 y.o.  Admit date: 02/15/2017 Discharge date: 02/16/2017  Primary Care Provider: Rutherford Guys Primary Cardiologist: Gwenlyn Found ([place]), [place]  Discharge Diagnoses    Active Problems:   PVD (peripheral vascular disease), hx stents to bil SFAs 02/2010   Claudication in peripheral vascular disease ([place])   Allergies Allergies  Allergen Reactions  . Brilinta [Ticagrelor] Shortness Of Breath  . Clonidine Derivatives Other (See Comments)    Lowers heart rate  . Atorvastatin Other (See Comments)    Possible cause of fatigue/malaise  . Crestor [Rosuvastatin Calcium] Other (See Comments)    Joint/muscle aches  . Exforge [Amlodipine Besylate-Valsartan] Itching and Rash  . Spironolactone Other (See Comments)    Contraindicated with history of hyperkalemia    Diagnostic Studies/Procedures    PV angiogram: 02/15/17  Angiographic Data:   1: Abdominal aortogram-distal abdominal aorta. Fluoroscopically moderately calcified but not atherosclerotic or obstructive nor was an aneurysmal. There was a 30-40% tapered narrowing down to the iliac bifurcation. 2: Left lower exam-the left SFA nitinol stent was patent with 40% diffuse in-stent restenosis and three-vessel runoff 3: Right lower extremity-there was a 90% proximal concentric right SFA stenosis. The right SFA nitinol stent was patent with 30-40%. Diffuse in-stent restenosis" with three-vessel runoff.  Final Impression: Successful directional atherectomy followed by drug eluding balloon angioplasty of a high-grade proximal right SFA stenosis for lifestyle limiting claudication. The patient will be treated with low-dose aspirin and Plavix for one month. After that Plavix can be discontinued and she can be treated with low-dose aspirin. The sheath will be removed once a state hospital and 70 pressure held. She'll be hydrated 12 hours, discharged in the  morning. We will get lower extremity arterial Doppler studies and our [facility] office next week and I will see her back in 2-3 weeks thereafter.  Quay Burow. MD, [facility] _____________   History of Present Illness     77 y.o. female who was referred by Dr. Sallyanne Kuster for peripheral vascular evaluation because otherwise tolerating claudication. She does have a history of PAF having undergone ablation at [facility] a month ago. She is on Tikosyn and Eliquis  however she had not taken her Eliquis several weeks. She has a history of CABG 3 in 2016 and subsequent stent procedures because of early graft failure. She also has PAD status post right left SFA intervention in a staged fashion in 2012 by Dr. Gwenlyn Found. Over the last year she's had recurrent right lower extreme claudication with Dopplers to suggest a high-frequency signal in her proximal right SFA. Given her symptoms and findings she was set up for outpatient PV angiogram.   Hospital Course     Underwent successful directional atherectomy and DE angioplasty of right SFA stenosis. Plan for triple triple with ASA/Plavix/Eliquis. Then will plan for ASA/Eliquis. No complications noted post cath. Morning labs were stable. Able to ambulate without any difficulty.    General: Well developed, well nourished, female appearing in no acute distress. Head: Normocephalic, atraumatic.  Neck: Supple without bruits, JVD. Lungs:  Resp regular and unlabored, CTA. Heart: RRR, S1, S2, no S3, S4, or murmur; no rub. Abdomen: Soft, non-tender, non-distended with normoactive bowel sounds. No hepatomegaly. No rebound/guarding. No obvious abdominal masses. Extremities: No clubbing, cyanosis, edema. Distal pedal pulses are 2+ bilaterally. L femoral cath site stable with bruising but no hematoma Neuro: Alert and oriented X 3. Moves all extremities spontaneously.  Psych: Normal affect.  CECILEE ROSNER was seen by Dr. Angelena Form and determined stable for discharge home.  Follow up in the office has been arranged. Medications are listed below.   _____________  Discharge Vitals Blood pressure (!) 174/78, pulse 76, temperature 98.6 F (37 C), temperature source Oral, resp. rate 14, height 5\' 5"  (1.651 m), weight 149 lb 14.6 oz (68 kg), SpO2 93 %.  Filed Weights   02/15/17 0739 02/16/17 0417  Weight: 149 lb (67.6 kg) 149 lb 14.6 oz (68 kg)    Labs & Radiologic Studies    CBC Recent Labs    02/16/17 0351  WBC 4.7  HGB 12.8  HCT 39.7  MCV 93.0  PLT 601   Basic Metabolic Panel Recent Labs    02/16/17 0351  NA 137  K 4.8  CL 105  CO2 26  GLUCOSE 100*  BUN 11  CREATININE 0.89  CALCIUM 9.9   Liver Function Tests No results for input(s): AST, ALT, ALKPHOS, BILITOT, PROT, ALBUMIN in the last 72 hours. No results for input(s): LIPASE, AMYLASE in the last 72 hours. Cardiac Enzymes No results for input(s): CKTOTAL, CKMB, CKMBINDEX, TROPONINI in the last 72 hours. BNP Invalid input(s): POCBNP D-Dimer No results for input(s): DDIMER in the last 72 hours. Hemoglobin A1C No results for input(s): HGBA1C in the last 72 hours. Fasting Lipid Panel No results for input(s): CHOL, HDL, LDLCALC, TRIG, CHOLHDL, LDLDIRECT in the last 72 hours. Thyroid Function Tests No results for input(s): TSH, T4TOTAL, T3FREE, THYROIDAB in the last 72 hours.  Invalid input(s): FREET3 _____________  No results found. Disposition   Pt is being discharged home today in good condition.  Follow-up Plans & Appointments    Follow-up Information    [facility] Follow up on 02/22/2017.   Specialty:  Cardiology Why:  at 1:30pm for your follow up dopplers Contact information: [address] [place] [phone]       Lorretta Harp, MD Follow up on 03/02/2017.   Specialties:  Cardiology, Radiology Why:  at 2:45pm for your follow up appt.  Contact information: [address] [place]  [address] [phone]          Discharge Instructions    Diet - low sodium heart healthy   Complete by:  As directed    Discharge instructions   Complete by:  As directed    Groin Site Care Refer to this sheet in the next few weeks. These instructions provide you with information on caring for yourself after your procedure. Your caregiver may also give you more specific instructions. Your treatment has been planned according to current medical practices, but problems sometimes occur. Call your caregiver if you have any problems or questions after your procedure. HOME CARE INSTRUCTIONS You may shower 24 hours after the procedure. Remove the bandage (dressing) and gently wash the site with plain soap and water. Gently pat the site dry.  Do not apply powder or lotion to the site.  Do not sit in a bathtub, swimming pool, or whirlpool for 5 to 7 days.  No bending, squatting, or lifting anything over 10 pounds (4.5 kg) as directed by your caregiver.  Inspect the site at least twice daily.  Do not drive home if you are discharged the same day of the procedure. Have someone else drive you.  You may drive 24 hours after the procedure unless otherwise instructed by your caregiver.  What to expect: Any bruising  will usually fade within 1 to 2 weeks.  Blood that collects in the tissue (hematoma) may be painful to the touch. It should usually decrease in size and tenderness within 1 to 2 weeks.  SEEK IMMEDIATE MEDICAL CARE IF: You have unusual pain at the groin site or down the affected leg.  You have redness, warmth, swelling, or pain at the groin site.  You have drainage (other than a small amount of blood on the dressing).  You have chills.  You have a fever or persistent symptoms for more than 72 hours.  You have a fever and your symptoms suddenly get worse.  Your leg becomes pale, cool, tingly, or numb.  You have heavy bleeding from the site. Hold pressure on the site. .  You will be on  triple therapy with aspirin, plavix and eliquis for one month. Then plan to stop the plavix. Please monitor for signs/symptoms of bleeding with these medications.   Increase activity slowly   Complete by:  As directed       Discharge Medications     Medication List    TAKE these medications   alendronate 70 MG tablet Commonly known as:  FOSAMAX Take 70 mg by mouth every Thursday.   apixaban 5 MG Tabs tablet Commonly known as:  ELIQUIS Take 1 tablet (5 mg total) by mouth 2 (two) times daily.   aspirin 81 MG EC tablet Take 1 tablet (81 mg total) by mouth daily. What changed:    medication strength  how much to take  when to take this   carvedilol 25 MG tablet Commonly known as:  COREG Take 12.5 mg by mouth 2 (two) times daily with a meal. What changed:  Another medication with the same name was removed. Continue taking this medication, and follow the directions you see here.   cholecalciferol 1000 units tablet Commonly known as:  VITAMIN D Take 1,000 Units by mouth 2 (two) times a week.   citalopram 20 MG tablet Commonly known as:  CELEXA Take 0.5 tablets (10 mg total) by mouth daily.   clopidogrel 75 MG tablet Commonly known as:  PLAVIX Take 1 tablet (75 mg total) by mouth daily with breakfast. Start taking on:  02/17/2017   Co Q-10 120 MG Caps Take 120 mg by mouth at bedtime.   dofetilide 250 MCG capsule Commonly known as:  TIKOSYN Take 250 mcg by mouth 2 (two) times daily. (0900 & 2100)   furosemide 40 MG tablet Commonly known as:  LASIX Take 1 tablet (40 mg total) by mouth as needed. What changed:    when to take this  reasons to take this   levothyroxine 25 MCG tablet Commonly known as:  SYNTHROID, LEVOTHROID Take 25-50 mcg by mouth daily before breakfast. Take 1 tablet (25 mcg) by mouth Monday through Thursday, then take 2 tablets (50 mcg) on Friday, Saturday, & Sunday.   LUBRICANT EYE DROPS 0.4-0.3 % Soln Generic drug:  Polyethyl Glycol-Propyl  Glycol Apply 1-2 drops to eye 3 (three) times daily as needed (for dry/irritated eyes).   MAGNESIUM CITRATE PO Take 400 mg by mouth daily.   multivitamin with minerals Tabs tablet Take 1 tablet by mouth 2 (two) times daily.   nitrofurantoin (macrocrystal-monohydrate) 100 MG capsule Commonly known as:  MACROBID Take 1 capsule (100 mg total) by mouth 2 (two) times daily.   nitroGLYCERIN 0.4 MG SL tablet Commonly known as:  NITROSTAT Place 1 tablet (0.4 mg total) under the tongue every 5 (five)  minutes x 3 doses as needed for chest pain.   SUPER OMEGA-3 1000 MG Caps Take 1,000 mg by mouth at bedtime.         Outstanding Labs/Studies   Follow up dopplers.   Duration of Discharge Encounter   Greater than 30 minutes including physician time.  Signed, Reino Bellis NP-C 02/16/2017, 8:44 AM   I have personally seen and examined this patient with Reino Bellis, NP. I agree with the assessment and plan as outlined above. She is doing well this am post intervention of her right SFA. Will d/c home today on ASA and Plavix. Stop Plavix in one month. Follow up in [facility] clinic.   Reino Bellis 02/16/2017 8:44 AM

## 2017-02-18 LAB — CUP PACEART REMOTE DEVICE CHECK
Battery Impedance: 375 Ohm
Battery Remaining Longevity: 90 mo
Battery Voltage: 2.78 V
Brady Statistic AP VP Percent: 0 %
Brady Statistic AP VS Percent: 97 %
Brady Statistic AS VP Percent: 0 %
Brady Statistic AS VS Percent: 3 %
Date Time Interrogation Session: 20190104151009
Implantable Lead Implant Date: 20131212
Implantable Lead Implant Date: 20131212
Implantable Lead Location: 753859
Implantable Lead Location: 753860
Implantable Lead Model: 5076
Implantable Lead Model: 5076
Implantable Pulse Generator Implant Date: 20131212
Lead Channel Impedance Value: 398 Ohm
Lead Channel Impedance Value: 501 Ohm
Lead Channel Pacing Threshold Amplitude: 0.75 V
Lead Channel Pacing Threshold Amplitude: 1 V
Lead Channel Pacing Threshold Pulse Width: 0.4 ms
Lead Channel Pacing Threshold Pulse Width: 0.4 ms
Lead Channel Setting Pacing Amplitude: 2 V
Lead Channel Setting Pacing Amplitude: 2.5 V
Lead Channel Setting Pacing Pulse Width: 0.4 ms
Lead Channel Setting Sensing Sensitivity: 5.6 mV

## 2017-02-20 ENCOUNTER — Other Ambulatory Visit: Payer: Self-pay

## 2017-02-20 ENCOUNTER — Encounter (HOSPITAL_COMMUNITY): Payer: Self-pay | Admitting: Emergency Medicine

## 2017-02-20 ENCOUNTER — Emergency Department (HOSPITAL_COMMUNITY): Payer: Medicare HMO

## 2017-02-20 ENCOUNTER — Emergency Department (HOSPITAL_COMMUNITY)
Admission: EM | Admit: 2017-02-20 | Discharge: 2017-02-20 | Disposition: A | Discharge status: Home / Self Care | Payer: Medicare HMO | Attending: Emergency Medicine | Admitting: Emergency Medicine

## 2017-02-20 DIAGNOSIS — S299XXA Unspecified injury of thorax, initial encounter: Secondary | ICD-10-CM | POA: Diagnosis present

## 2017-02-20 DIAGNOSIS — I5032 Chronic diastolic (congestive) heart failure: Secondary | ICD-10-CM | POA: Diagnosis not present

## 2017-02-20 DIAGNOSIS — Z7902 Long term (current) use of antithrombotics/antiplatelets: Secondary | ICD-10-CM | POA: Insufficient documentation

## 2017-02-20 DIAGNOSIS — E213 Hyperparathyroidism, unspecified: Secondary | ICD-10-CM | POA: Insufficient documentation

## 2017-02-20 DIAGNOSIS — Z950 Presence of cardiac pacemaker: Secondary | ICD-10-CM | POA: Insufficient documentation

## 2017-02-20 DIAGNOSIS — N182 Chronic kidney disease, stage 2 (mild): Secondary | ICD-10-CM | POA: Insufficient documentation

## 2017-02-20 DIAGNOSIS — Y999 Unspecified external cause status: Secondary | ICD-10-CM | POA: Insufficient documentation

## 2017-02-20 DIAGNOSIS — Y9301 Activity, walking, marching and hiking: Secondary | ICD-10-CM | POA: Diagnosis not present

## 2017-02-20 DIAGNOSIS — R0781 Pleurodynia: Secondary | ICD-10-CM | POA: Diagnosis not present

## 2017-02-20 DIAGNOSIS — Z87891 Personal history of nicotine dependence: Secondary | ICD-10-CM | POA: Insufficient documentation

## 2017-02-20 DIAGNOSIS — W01198A Fall on same level from slipping, tripping and stumbling with subsequent striking against other object, initial encounter: Secondary | ICD-10-CM | POA: Insufficient documentation

## 2017-02-20 DIAGNOSIS — I2510 Atherosclerotic heart disease of native coronary artery without angina pectoris: Secondary | ICD-10-CM | POA: Diagnosis not present

## 2017-02-20 DIAGNOSIS — Z7982 Long term (current) use of aspirin: Secondary | ICD-10-CM | POA: Insufficient documentation

## 2017-02-20 DIAGNOSIS — S2232XA Fracture of one rib, left side, initial encounter for closed fracture: Secondary | ICD-10-CM | POA: Diagnosis not present

## 2017-02-20 DIAGNOSIS — T148XXA Other injury of unspecified body region, initial encounter: Secondary | ICD-10-CM | POA: Diagnosis not present

## 2017-02-20 DIAGNOSIS — Y92002 Bathroom of unspecified non-institutional (private) residence single-family (private) house as the place of occurrence of the external cause: Secondary | ICD-10-CM | POA: Insufficient documentation

## 2017-02-20 DIAGNOSIS — Z951 Presence of aortocoronary bypass graft: Secondary | ICD-10-CM | POA: Diagnosis not present

## 2017-02-20 MED ORDER — OXYCODONE-ACETAMINOPHEN 5-325 MG PO TABS: 1.0000 | ORAL_TABLET | ORAL | 0 refills | Status: DC | PRN

## 2017-02-20 MED ORDER — HYDROMORPHONE HCL 1 MG/ML IJ SOLN
0.7500 mg | Freq: Once | INTRAMUSCULAR | Status: AC
  Administered 2017-02-20: 0.75 mg via INTRAMUSCULAR
  Filled 2017-02-20: qty 1

## 2017-02-20 MED ORDER — LORAZEPAM 1 MG PO TABS
0.5000 mg | ORAL_TABLET | Freq: Once | ORAL | Status: AC
Start: 2017-02-20 — End: 2017-02-20
  Administered 2017-02-20: 0.5 mg via ORAL
  Filled 2017-02-20: qty 1

## 2017-02-20 MED ORDER — LORAZEPAM 1 MG PO TABS: 0.5000 mg | ORAL_TABLET | Freq: Three times a day (TID) | ORAL | 0 refills | Status: DC | PRN

## 2017-02-20 NOTE — Discharge Instructions (Signed)
Your chest x-ray shows a fracture of your left eighth rib.  Take pain and muscle relaxant medication as needed.  Be very careful as they are both sedating medications.  Use provided incentive spirometer.

## 2017-02-20 NOTE — ED Provider Notes (Signed)
[facility] EMERGENCY DEPARTMENT Provider Note   CSN: [id number] Arrival date & time: 02/20/17  0743     History   Chief Complaint Chief Complaint  Patient presents with  . Fall    HPI [name] is a 77 y.o. female.  HPI   77 year old female with left chest pain.  She fell yesterday evening in her bathroom when her slipper got caught on a rug.  She struck the left side of her chest against the bathtub.  She has had persistent pain since then.  Worse with any type of movement.  She denies any significant headache neck or back pain.  She is on aspirin Plavix.  Past Medical History:  Diagnosis Date  . ANXIETY 12/31/2009  . Aortic insufficiency    a. mod by echo 2017.  [place] Kitchen CAD (coronary artery disease)    a. s/p CABGx3 and LAA clipping in 12/2014, NSTEMI 05/2015 s/p DES to native RCA and SVG-dRCA; occluded ramus-SVG was treated medically). c. neg nuc 10/2015 at [facility].  . Chronic diastolic CHF (congestive heart failure) ([place])   . Chronic fatigue   . CKD (chronic kidney disease), stage II   . DEPRESSION 12/31/2009  . [place] DISEASE, CERVICAL 12/31/2009  . [place] DISEASE, LUMBAR 12/31/2009  . DIVERTICULITIS, HX OF 12/31/2009  . Essential hypertension   . GLUCOSE INTOLERANCE 12/31/2009  . Habitual alcohol use   . Hypercalcemia   . Hyperkalemia   . Hyperlipidemia 12/31/2009  . MENOPAUSE, EARLY 12/31/2009  . NSTEMI (non-ST elevated myocardial infarction) ([place]) 05/11/2015  . Orthostatic hypotension   . OSTEOARTHRITIS, HAND   . Pacemaker 01/21/2012   MDT Adapta dual chamber  . Paroxysmal atrial flutter ([place])   . Persistent atrial fibrillation ([place])   . Pre-diabetes    PATIENT DENIES  . PVD (peripheral vascular disease), hx stents to bil SFAs 02/2010   . Symptomatic sinus bradycardia 01/22/2012  . Syncope    a. 11/2015 ? due to medications.  . Tachy-brady syndrome ([place])    a. s/p MDT PPM 2013.  . Tricuspid regurgitation     Patient Active Problem List   Diagnosis Date Noted  . Claudication in peripheral vascular disease ([place]) 02/15/2017  . Encounter for monitoring dofetilide therapy 09/25/2016  . Hyperparathyroidism ([place]) 05/13/2016  . Pacemaker 01/10/2016  . Heme positive stool   . Benign neoplasm of ascending colon   . Long term current use of anticoagulant   . Gastrointestinal hemorrhage 12/17/2015  . Acute blood loss anemia 12/17/2015  . CKD (chronic kidney disease), stage II   . Syncope 12/05/2015  . Hypotension 12/05/2015  . Hyperkalemia 12/04/2015  . H/O amiodarone therapy 12/04/2015  . Chronic diastolic heart failure ([place]) 10/19/2015  . Hypercalcemia 10/19/2015  . Atypical atrial flutter ([place])   . Paroxysmal atrial flutter ([place]) 08/23/2015  . Febrile illness, acute 06/27/2015  . Chronic fatigue 06/27/2015  . Stented coronary artery   . NSTEMI (non-ST elevated myocardial infarction) ([place]) 05/11/2015  . S/P CABG x 3 01/04/15 01/10/2015  . CAD (coronary artery disease) 01/04/2015  . Symptomatic sinus bradycardia 01/22/2012  . PAF (paroxysmal atrial fibrillation) ([place]) 01/22/2012  . Sinus node dysfunction ([place]) 01/22/2012  . S/P placement of cardiac pacemaker, medtronic adapta 01/21/12 01/22/2012  . PVD (peripheral vascular disease), hx stents to bil SFAs 02/2010 01/22/2012  . Hyperlipidemia 12/31/2009  . Anxiety 12/31/2009  . DEPRESSION 12/31/2009  . Coronary atherosclerosis 12/31/2009  . OSTEOARTHRITIS, HAND 12/31/2009  . [place] DISEASE, CERVICAL 12/31/2009  . [place]  DISEASE, LUMBAR 12/31/2009  . UNSPECIFIED URINARY INCONTINENCE 12/31/2009    Past Surgical History:  Procedure Laterality Date  . ABDOMINAL AORTOGRAM W/LOWER EXTREMITY Bilateral 02/15/2017   Procedure: ABDOMINAL AORTOGRAM W/LOWER EXTREMITY;  Surgeon: Lorretta Harp, MD;  Location: [facility] CV LAB;  Service: Cardiovascular;  Laterality: Bilateral;  bilat  . AUGMENTATION MAMMAPLASTY  1983  . CARDIAC CATHETERIZATION N/A 01/01/2015   Procedure: Left Heart  Cath and Coronary Angiography;  Surgeon: Jettie Booze, MD;  Location: [facility] CV LAB;  Service: Cardiovascular;  Laterality: N/A;  . CARDIAC CATHETERIZATION N/A 05/12/2015   Procedure: Left Heart Cath and Coronary Angiography;  Surgeon: Troy Sine, MD;  Location: [facility] CV LAB;  Service: Cardiovascular;  Laterality: N/A;  . CARDIAC CATHETERIZATION N/A 05/12/2015   Procedure: Coronary Stent Intervention;  Surgeon: Troy Sine, MD;  Location: [facility] CV LAB;  Service: Cardiovascular;  Laterality: N/A;  . CARDIOVERSION N/A 02/01/2015   Procedure: CARDIOVERSION;  Surgeon: Lelon Perla, MD;  Location: [facility] ENDOSCOPY;  Service: Cardiovascular;  Laterality: N/A;  . CARDIOVERSION N/A 08/30/2015   Procedure: CARDIOVERSION;  Surgeon: Sanda Klein, MD;  Location: [facility] ENDOSCOPY;  Service: Cardiovascular;  Laterality: N/A;  . CARPAL TUNNEL RELEASE  2008   "right hand/thumb; carpal tunnel repair; got rid of arthritis" (01/21/2012)  . CLIPPING OF ATRIAL APPENDAGE N/A 01/04/2015   Procedure: CLIPPING OF ATRIAL APPENDAGE;  Surgeon: Grace Isaac, MD;  Location: [facility];  Service: Open Heart Surgery;  Laterality: N/A;  . COLONOSCOPY N/A 12/21/2015   Procedure: COLONOSCOPY;  Surgeon: Milus Banister, MD;  Location: [facility] ENDOSCOPY;  Service: Endoscopy;  Laterality: N/A;  . CORONARY ARTERY BYPASS GRAFT N/A 01/04/2015   Procedure: CORONARY ARTERY BYPASS GRAFTING (CABG) x 3 using left internal mammory artery and greater saphenous vein right leg harvested endoscopically.;  Surgeon: Grace Isaac, MD;  LIMA-LAD, SVG-RI, SVG-PDA  . ESOPHAGOGASTRODUODENOSCOPY (EGD) WITH PROPOFOL N/A 12/18/2015   Procedure: ESOPHAGOGASTRODUODENOSCOPY (EGD) WITH PROPOFOL;  Surgeon: Milus Banister, MD;  Location: [facility] ENDOSCOPY;  Service: Endoscopy;  Laterality: N/A;  . FACELIFT, LOWER 2/3  1995   "mini" (01/21/2012)  . GIVENS CAPSULE STUDY N/A 12/21/2015   Procedure: GIVENS CAPSULE STUDY;  Surgeon: Milus Banister, MD;  Location: [facility] ENDOSCOPY;  Service: Endoscopy;  Laterality: N/A;  . Lower Arterial Examination  10/28/2011   R. SFA stent mild-moderate mixed density plaque with elevated velocities consistent with 50% diameter reduction. L. SFA stent moderate mixed denisty plaque at mid to distal level consistent with 50-69% diameter reduction.  . LOWER EXTREMITY ANGIOGRAPHY N/A 02/15/2017   Procedure: LOWER EXTREMITY ANGIOGRAPHY;  Surgeon: Lorretta Harp, MD;  Location: [facility] CV LAB;  Service: Cardiovascular;  Laterality: N/A;  . OOPHORECTOMY  ~1979  . PARTIAL COLECTOMY  2010  . PERIPHERAL ARTERIAL STENT GRAFT  2012; 2012   "LLE; RLE" (01/21/2012)  . PERIPHERAL VASCULAR BALLOON ANGIOPLASTY Right 02/15/2017   Procedure: PERIPHERAL VASCULAR BALLOON ANGIOPLASTY;  Surgeon: Lorretta Harp, MD;  Location: [facility] CV LAB;  Service: Cardiovascular;  Laterality: Right;  SFA    . PERMANENT PACEMAKER INSERTION N/A 01/21/2012   Medtronic Adapta L implanted by Dr Sallyanne Kuster for tachy/brady syndrome  . POSTERIOR CERVICAL LAMINECTOMY  1985  . TEE WITHOUT CARDIOVERSION N/A 01/04/2015   Procedure: TRANSESOPHAGEAL ECHOCARDIOGRAM (TEE);  Surgeon: Grace Isaac, MD;  Location: [facility];  Service: Open Heart Surgery;  Laterality: N/A;  . TEE WITHOUT CARDIOVERSION N/A 02/01/2015   Procedure: TRANSESOPHAGEAL ECHOCARDIOGRAM (TEE);  Surgeon: Lelon Perla, MD;  Location[facility] ENDOSCOPY;  Service: Cardiovascular;  Laterality: N/A;  . TEE WITHOUT CARDIOVERSION N/A 08/30/2015   Procedure: TRANSESOPHAGEAL ECHOCARDIOGRAM (TEE);  Surgeon: Sanda Klein, MD;  Location[facility]COPY;  Service: Cardiovascular;  Laterality: N/A;  . [place]    OB History    No data available       Home Medications    Prior to Admission medications   Medication Sig Start Date End Date Taking? Authorizing Provider  alendronate (FOSAMAX) 70 MG tablet Take 70 mg by mouth every Thursday.  03/24/16   [provider]  apixaban (ELIQUIS) 5 MG TABS tablet Take 1 tablet (5 mg total) by mouth 2 (two) times daily. 02/16/17   Croitoru, Mihai, MD  aspirin EC 81 MG EC tablet Take 1 tablet (81 mg total) by mouth daily. 02/16/17   Cheryln Manly, NP  carvedilol (COREG) 25 MG tablet Take 12.5 mg by mouth 2 (two) times daily with a meal.    [provider]  cholecalciferol (VITAMIN D) 1000 UNITS tablet Take 1,000 Units by mouth 2 (two) times a week.     [provider]  citalopram (CELEXA) 20 MG tablet Take 0.5 tablets (10 mg total) by mouth daily. 12/09/15   Theodis Blaze, MD  clopidogrel (PLAVIX) 75 MG tablet Take 1 tablet (75 mg total) by mouth daily with breakfast. 02/17/17   Reino Bellis B, NP  Coenzyme Q10 (CO Q-10) 120 MG CAPS Take 120 mg by mouth at bedtime.    [provider]  dofetilide (TIKOSYN) 250 MCG capsule Take 250 mcg by mouth 2 (two) times daily. (0900 & 2100)    [provider]  furosemide (LASIX) 40 MG tablet Take 1 tablet (40 mg total) by mouth as needed. Patient taking differently: Take 40 mg by mouth daily as needed (for fluid retention/swelling.).  08/03/16   Croitoru, Dani Gobble, MD  levothyroxine (SYNTHROID, LEVOTHROID) 25 MCG tablet Take 25-50 mcg by mouth daily before breakfast. Take 1 tablet (25 mcg) by mouth Monday through Thursday, then take 2 tablets (50 mcg) on Friday, Saturday, & Sunday. 04/03/16   [provider]  MAGNESIUM CITRATE PO Take 400 mg by mouth daily.    [provider]  Multiple Vitamin (MULTIVITAMIN WITH MINERALS) TABS tablet Take 1 tablet by mouth 2 (two) times daily.    [provider]  nitrofurantoin, macrocrystal-monohydrate, (MACROBID) 100 MG capsule Take 1 capsule (100 mg total) by mouth 2 (two) times daily. 02/09/17   Rutherford Guys, MD  nitroGLYCERIN (NITROSTAT) 0.4 MG SL tablet Place 1 tablet (0.4 mg total) under the tongue every 5 (five) minutes x 3 doses as needed for chest pain. 05/15/15    Lyda Jester M, PA-C  Omega-3 Fatty Acids (SUPER OMEGA-3) 1000 MG CAPS Take 1,000 mg by mouth at bedtime.     [provider]  Polyethyl Glycol-Propyl Glycol (LUBRICANT EYE DROPS) 0.4-0.3 % SOLN Apply 1-2 drops to eye 3 (three) times daily as needed (for dry/irritated eyes).    [provider]    Family History Family History  Problem Relation Age of Onset  . Heart disease Mother   . Hypertension Mother   . Stroke Mother   . Stroke Father   . Heart disease Father   . Heart disease Brother   . Hypertension Brother        2 brothers  . CVA Brother        2 brothers  .  Heart attack Brother        2 brothers    Social History Social History   Tobacco Use  . Smoking status: Former Smoker    Packs/day: 0.75    Years: 10.00    Pack years: 7.50    Types: Cigarettes  . Smokeless tobacco: Never Used  . Tobacco comment: 01/21/2012 "quit smoking ~ 2002"  Substance Use Topics  . Alcohol use: Yes    Comment: admits to drinking 3+ drinks last night, and possibly daily  . Drug use: No     Allergies   Brilinta [ticagrelor]; Clonidine derivatives; Atorvastatin; Crestor [rosuvastatin calcium]; Exforge [amlodipine besylate-valsartan]; and Spironolactone   Review of Systems Review of Systems  All systems reviewed and negative, other than as noted in HPI.  Physical Exam Updated Vital Signs BP 137/75 (BP Location: Right Arm)   Pulse 82   Temp 98.5 F (36.9 C) (Oral)   Resp 16   Ht 5\' 5"  (1.651 m)   Wt 67.6 kg (149 lb)   SpO2 98%   BMI 24.79 kg/m   Physical Exam  Constitutional: She appears well-developed and well-nourished. No distress.  HENT:  Head: Normocephalic and atraumatic.  Eyes: Conjunctivae are normal. Right eye exhibits no discharge. Left eye exhibits no discharge.  Neck: Neck supple.  Cardiovascular: Normal rate, regular rhythm and normal heart sounds. Exam reveals no gallop and no friction rub.  No murmur heard. Pulmonary/Chest:  Effort normal and breath sounds normal. No respiratory distress. She exhibits tenderness.  Tenderness palpation over the left anterior left chest.  No overlying skin changes.  No crepitus.  Breath sounds are clear bilaterally and symmetric.  Abdominal: Soft. She exhibits no distension. There is no tenderness.  Musculoskeletal: She exhibits no edema or tenderness.  Bruising to left groin which seems appropriate given her recent vascular procedure.  She does not have any significant bony tenderness or pain with range of motion of the hip.  Neurological: She is alert.  Skin: Skin is warm and dry.  Psychiatric: She has a normal mood and affect. Her behavior is normal. Thought content normal.  Nursing note and vitals reviewed.    ED Treatments / Results  Labs (all labs ordered are listed, but only abnormal results are displayed) Labs Reviewed - No data to display  EKG  EKG Interpretation None       Radiology Dg Ribs Unilateral W/chest Left  Result Date: 02/20/2017 CLINICAL DATA:  Fall, lateral chest pain EXAM: LEFT RIBS AND CHEST - 3+ VIEW COMPARISON:  05/30/2016 FINDINGS: Prior CABG. Left pacer is in place, unchanged. Cardiomegaly. There is hyperinflation of the lungs compatible with COPD. Lungs are clear. No effusion or pneumothorax. There appears to be a nondisplaced left anterior 8th rib fracture. IMPRESSION: COPD, cardiomegaly. Apparent nondisplaced left anterior 8th rib fracture. No associated effusion or pneumothorax. Electronically Signed   By: Rolm Baptise M.D.   On: 02/20/2017 08:59    Procedures Procedures (including critical care time)  Medications Ordered in ED Medications  HYDROmorphone (DILAUDID) injection 0.75 mg (not administered)  LORazepam (ATIVAN) tablet 0.5 mg (not administered)     Initial Impression / Assessment and Plan / ED Course  I have reviewed the triage vital signs and the nursing notes.  Pertinent labs & imaging results that were available during  my care of the patient were reviewed by me and considered in my medical decision making (see chart for details).     77 year old female with left chest pain after mechanical  fall.  Very reproducible.  Plain films with possible nondisplaced eighth rib fracture.  Pain medicine as needed.  Incentive spirometry.  She is on aspirin Plavix, but denies any significant headache.  Nonfocal neurological examination.  Final Clinical Impressions(s) / ED Diagnoses   Final diagnoses:  Closed fracture of one rib of left side, initial encounter    ED Discharge Orders    None       Virgel Manifold, MD 02/20/17 1002

## 2017-02-20 NOTE — Progress Notes (Signed)
RT instructed pt on incentive spirometry use q1hw/a. Pt achieved 1000 ml. RA sats 98% and no distress noted.

## 2017-02-20 NOTE — ED Triage Notes (Addendum)
To ED via [facility] from home with c/o fall last night at approx 10pm last night-- boyfriend and daughter helped get pt into bed, has not walked since -- c/o left rib pain, hurts to move, breath,  ona rrival pt is alert/oriented x 4.   Received Fentanyl 14mcg x 2 IM  Pt has recent heart cath on Monday. Also has pacemaker.

## 2017-02-22 ENCOUNTER — Inpatient Hospital Stay (HOSPITAL_COMMUNITY)
Admission: RE | Admit: 2017-02-22 | Payer: Medicare HMO | Source: Ambulatory Visit | Attending: Cardiovascular Disease | Admitting: Cardiovascular Disease

## 2017-02-22 ENCOUNTER — Telehealth: Payer: Self-pay | Admitting: Cardiovascular Disease

## 2017-02-22 NOTE — Telephone Encounter (Signed)
Returned the call to the patient. She once again explained how she fell and was prescribed lorazepam and percocet for her rib fracture. She wanted to know if Dr. Sallyanne Kuster would prescribe something additional for anxiety. She has been informed that she needs to call her PCP for recommendations from them. She has verbalized her understanding.

## 2017-02-22 NOTE — Telephone Encounter (Signed)
Returned the call to the patient. She stated that she fell over the weekend and broke some ribs on the left side. She has been prescribed percocet every 4 hours and ativan every 8 hours as needed from the ED. She stated that she has been taking them around the clock (every 4 and every 8 respectively) She would like to know if Dr. Sallyanne Kuster would prescribe her something in addition to the ativan for anxiety. She has been made aware that this should come from her PCP. She has also been educated on the effects these medications can have on her cognitively and physically. She should monitor her blood pressure and heart rate. She stated that her fiance is taking care of her.

## 2017-02-22 NOTE — Telephone Encounter (Signed)
Pt had a fall,she wants to check on her medicine.She wants to see if it is alright to take certain medicine with her regular medicine.

## 2017-02-22 NOTE — Telephone Encounter (Signed)
New Message   Patient is returning call that she received.

## 2017-02-23 ENCOUNTER — Telehealth: Payer: Self-pay | Admitting: Family Medicine

## 2017-02-23 NOTE — Telephone Encounter (Signed)
Copied from [place]. Topic: General - Call Back - No Documentation >> Feb 23, 2017  9:45 AM Darl Householder, RMA wrote: Reason for CRM: patient is requesting a call back concerning taking an anti-inflammatory medication while pain medication, please return pt call

## 2017-02-25 ENCOUNTER — Other Ambulatory Visit: Payer: Self-pay | Admitting: Cardiovascular Disease

## 2017-02-25 MED ORDER — CARVEDILOL 12.5 MG PO TABS: 12.5000 mg | ORAL_TABLET | Freq: Two times a day (BID) | ORAL | 3 refills | Status: DC

## 2017-02-25 NOTE — Telephone Encounter (Signed)
Patient calling the office for samples of medication:   1.  What medication and dosage are you requesting samples for? Eliquis  2.  Are you currently out of this medication? Just a few left    

## 2017-02-25 NOTE — Telephone Encounter (Signed)
Spoke with patient in regards to pain medication and NSAID. Pt stated that percocet was making her sick and that she wanted to know if she could start taking an NSAID instead. I advised pt that due to her current chronic conditions that it would be better for her to take tylenol versus ibuprofen. Pt understood direction. Pt would like to speak with Practice Admin in regards to time frame it took for response on message.

## 2017-02-25 NOTE — Telephone Encounter (Signed)
Eliquis 5 mg #3 lot [id number] exp 3/21 at front desk for pick up, patient aware Sent new Rx for Coreg 12.5 mg twice daily to mail order so she would not have to cut her 25 mg tablets. Called to [facility] to have local rx #30 with 5 refills and asked them to put on file

## 2017-03-02 ENCOUNTER — Ambulatory Visit: Payer: Medicare HMO | Admitting: Cardiovascular Disease

## 2017-03-02 ENCOUNTER — Encounter: Payer: Self-pay | Admitting: Cardiovascular Disease

## 2017-03-08 ENCOUNTER — Telehealth: Payer: Self-pay | Admitting: Cardiovascular Disease

## 2017-03-08 NOTE — Telephone Encounter (Signed)
New Message   Patient is calling to speak with a nurse. She states that she is bleeding from her neck and arms. Please call to discuss.

## 2017-03-08 NOTE — Telephone Encounter (Signed)
Please stop the aspirin ASAP. Please advise that it will take about 5 days to wear off [name]

## 2017-03-08 NOTE — Telephone Encounter (Signed)
Incoming call from the patient. She stated that she is having "blood bumps" appear on her neck and arms that she is having a hard time controlling the bleeding. She wants to be taken off of one of her blood thinners/anticoaguants or she stated that she will stop one herself. She is currently on aspirin, eliquis and plavix. She was adamant that she will stop them herself regardless of provider approval. Message routed.

## 2017-03-09 NOTE — Telephone Encounter (Signed)
Patient made aware to discontinue the aspirin. She will call back if the bleeding does not get better

## 2017-03-10 ENCOUNTER — Ambulatory Visit (INDEPENDENT_AMBULATORY_CARE_PROVIDER_SITE_OTHER): Payer: Medicare HMO | Admitting: Emergency Medicine

## 2017-03-10 ENCOUNTER — Encounter: Payer: Self-pay | Admitting: Emergency Medicine

## 2017-03-10 ENCOUNTER — Ambulatory Visit (INDEPENDENT_AMBULATORY_CARE_PROVIDER_SITE_OTHER): Payer: Medicare HMO

## 2017-03-10 VITALS — BP 122/72 | HR 78 | Temp 98.2°F | Resp 17 | Ht 65.5 in | Wt 143.0 lb

## 2017-03-10 DIAGNOSIS — Z8781 Personal history of (healed) traumatic fracture: Secondary | ICD-10-CM

## 2017-03-10 DIAGNOSIS — R0781 Pleurodynia: Secondary | ICD-10-CM | POA: Diagnosis not present

## 2017-03-10 DIAGNOSIS — S2241XA Multiple fractures of ribs, right side, initial encounter for closed fracture: Secondary | ICD-10-CM | POA: Diagnosis not present

## 2017-03-10 DIAGNOSIS — S2242XS Multiple fractures of ribs, left side, sequela: Secondary | ICD-10-CM

## 2017-03-10 MED ORDER — HYDROCODONE-ACETAMINOPHEN 5-325 MG PO TABS: ORAL_TABLET | ORAL | 0 refills | Status: DC

## 2017-03-10 NOTE — Progress Notes (Addendum)
[name] 77 y.o.   Chief Complaint  Patient presents with  . Rib Injury    HISTORY OF PRESENT ILLNESS: This is a 77 y.o. female status post left eighth rib fracture 3 weeks ago complaining of persistent pain to the left side.  Denies fever, chills, difficulty breathing, syncope, hemoptysis, cough, or any other associated symptoms.  HPI   Prior to Admission medications   Medication Sig Start Date End Date Taking? Authorizing Provider  alendronate (FOSAMAX) 70 MG tablet Take 70 mg by mouth every Thursday.  03/24/16  Yes [provider]  apixaban (ELIQUIS) 5 MG TABS tablet Take 1 tablet (5 mg total) by mouth 2 (two) times daily. 02/16/17  Yes Croitoru, Mihai, MD  carvedilol (COREG) 12.5 MG tablet Take 1 tablet (12.5 mg total) by mouth 2 (two) times daily with a meal. 02/25/17  Yes Croitoru, Mihai, MD  cholecalciferol (VITAMIN D) 1000 UNITS tablet Take 1,000 Units by mouth 2 (two) times a week.    Yes [provider]  citalopram (CELEXA) 20 MG tablet Take 0.5 tablets (10 mg total) by mouth daily. 12/09/15  Yes Theodis Blaze, MD  clopidogrel (PLAVIX) 75 MG tablet Take 1 tablet (75 mg total) by mouth daily with breakfast. 02/17/17  Yes Reino Bellis B, NP  Coenzyme Q10 (CO Q-10) 120 MG CAPS Take 120 mg by mouth at bedtime.   Yes [provider]  dofetilide (TIKOSYN) 250 MCG capsule Take 250 mcg by mouth 2 (two) times daily. (0900 & 2100)   Yes [provider]  furosemide (LASIX) 40 MG tablet Take 1 tablet (40 mg total) by mouth as needed. Patient taking differently: Take 40 mg by mouth daily as needed (for fluid retention/swelling.).  08/03/16  Yes Croitoru, Dani Gobble, MD  levothyroxine (SYNTHROID, LEVOTHROID) 25 MCG tablet Take 25-50 mcg by mouth daily before breakfast. Take 1 tablet (25 mcg) by mouth Monday through Thursday, then take 2 tablets (50 mcg) on Friday, Saturday, & Sunday. 04/03/16  Yes [provider]  LORazepam (ATIVAN) 1 MG tablet Take  0.5 tablets (0.5 mg total) by mouth every 8 (eight) hours as needed (muscle spasm). 02/20/17  Yes Virgel Manifold, MD  MAGNESIUM CITRATE PO Take 400 mg by mouth daily.   Yes [provider]  Multiple Vitamin (MULTIVITAMIN WITH MINERALS) TABS tablet Take 1 tablet by mouth 2 (two) times daily.   Yes [provider]  nitrofurantoin, macrocrystal-monohydrate, (MACROBID) 100 MG capsule Take 1 capsule (100 mg total) by mouth 2 (two) times daily. 02/09/17  Yes Rutherford Guys, MD  nitroGLYCERIN (NITROSTAT) 0.4 MG SL tablet Place 1 tablet (0.4 mg total) under the tongue every 5 (five) minutes x 3 doses as needed for chest pain. 05/15/15  Yes Simmons, Brittainy M, PA-C  Omega-3 Fatty Acids (SUPER OMEGA-3) 1000 MG CAPS Take 1,000 mg by mouth at bedtime.    Yes [provider]  oxyCODONE-acetaminophen (PERCOCET/ROXICET) 5-325 MG tablet Take 1 tablet by mouth every 4 (four) hours as needed for severe pain. 02/20/17  Yes Virgel Manifold, MD  Polyethyl Glycol-Propyl Glycol (LUBRICANT EYE DROPS) 0.4-0.3 % SOLN Apply 1-2 drops to eye 3 (three) times daily as needed (for dry/irritated eyes).   Yes [provider]    Allergies  Allergen Reactions  . Brilinta [Ticagrelor] Shortness Of Breath  . Clonidine Derivatives Other (See Comments)    Lowers heart rate  . Atorvastatin Other (See Comments)    Possible cause of fatigue/malaise  . Crestor [Rosuvastatin Calcium] Other (  See Comments)    Joint/muscle aches  . Exforge [Amlodipine Besylate-Valsartan] Itching and Rash  . Spironolactone Other (See Comments)    Contraindicated with history of hyperkalemia    Patient Active Problem List   Diagnosis Date Noted  . Claudication in peripheral vascular disease ([place]) 02/15/2017  . Encounter for monitoring dofetilide therapy 09/25/2016  . Hyperparathyroidism ([place]) 05/13/2016  . Pacemaker 01/10/2016  . Heme positive stool   . Benign neoplasm of ascending colon   . Long term current use of  anticoagulant   . Gastrointestinal hemorrhage 12/17/2015  . Acute blood loss anemia 12/17/2015  . CKD (chronic kidney disease), stage II   . Syncope 12/05/2015  . Hypotension 12/05/2015  . Hyperkalemia 12/04/2015  . H/O amiodarone therapy 12/04/2015  . Chronic diastolic heart failure ([place]) 10/19/2015  . Hypercalcemia 10/19/2015  . Atypical atrial flutter ([place])   . Paroxysmal atrial flutter ([place]) 08/23/2015  . Febrile illness, acute 06/27/2015  . Chronic fatigue 06/27/2015  . Stented coronary artery   . NSTEMI (non-ST elevated myocardial infarction) ([place]) 05/11/2015  . S/P CABG x 3 01/04/15 01/10/2015  . CAD (coronary artery disease) 01/04/2015  . Symptomatic sinus bradycardia 01/22/2012  . PAF (paroxysmal atrial fibrillation) ([place]) 01/22/2012  . Sinus node dysfunction ([place]) 01/22/2012  . S/P placement of cardiac pacemaker, medtronic adapta 01/21/12 01/22/2012  . PVD (peripheral vascular disease), hx stents to bil SFAs 02/2010 01/22/2012  . Hyperlipidemia 12/31/2009  . Anxiety 12/31/2009  . DEPRESSION 12/31/2009  . Coronary atherosclerosis 12/31/2009  . OSTEOARTHRITIS, HAND 12/31/2009  . [place] DISEASE, CERVICAL 12/31/2009  . [place] DISEASE, LUMBAR 12/31/2009  . UNSPECIFIED URINARY INCONTINENCE 12/31/2009    Past Medical History:  Diagnosis Date  . ANXIETY 12/31/2009  . Aortic insufficiency    a. mod by echo 2017.  Marland Kitchen CAD (coronary artery disease)    a. s/p CABGx3 and LAA clipping in 12/2014, NSTEMI 05/2015 s/p DES to native RCA and SVG-dRCA; occluded ramus-SVG was treated medically). c. neg nuc 10/2015 [facility].  . Chronic diastolic CHF (congestive heart failure) ([place])   . Chronic fatigue   . CKD (chronic kidney disease), stage II   . DEPRESSION 12/31/2009  . [place] DISEASE, CERVICAL 12/31/2009  . [place] DISEASE, LUMBAR 12/31/2009  . DIVERTICULITIS, HX OF 12/31/2009  . Essential hypertension   . GLUCOSE INTOLERANCE 12/31/2009  . Habitual alcohol use   . Hypercalcemia   .  Hyperkalemia   . Hyperlipidemia 12/31/2009  . MENOPAUSE, EARLY 12/31/2009  . NSTEMI (non-ST elevated myocardial infarction) ([place]) 05/11/2015  . Orthostatic hypotension   . OSTEOARTHRITIS, HAND   . Pacemaker 01/21/2012   MDT Adapta dual chamber  . Paroxysmal atrial flutter ([place])   . Persistent atrial fibrillation ([place])   . Pre-diabetes    PATIENT DENIES  . PVD (peripheral vascular disease), hx stents to bil SFAs 02/2010   . Symptomatic sinus bradycardia 01/22/2012  . Syncope    a. 11/2015 ? due to medications.  . Tachy-brady syndrome ([place])    a. s/p MDT PPM 2013.  . Tricuspid regurgitation     Past Surgical History:  Procedure Laterality Date  . ABDOMINAL AORTOGRAM W/LOWER EXTREMITY Bilateral 02/15/2017   Procedure: ABDOMINAL AORTOGRAM W/LOWER EXTREMITY;  Surgeon: Lorretta Harp, MD;  Location: [place] [facility];  Service: Cardiovascular;  Laterality: Bilateral;  bilat  . AUGMENTATION MAMMAPLASTY  1983  . CARDIAC CATHETERIZATION N/A 01/01/2015   Procedure: Left Heart Cath and Coronary Angiography;  Surgeon: Jettie Booze, MD;  Location: [place]  [facility];  Service: Cardiovascular;  Laterality: N/A;  . CARDIAC CATHETERIZATION N/A 05/12/2015   Procedure: Left Heart Cath and Coronary Angiography;  Surgeon: Troy Sine, MD;  Location: [place] [facility];  Service: Cardiovascular;  Laterality: N/A;  . CARDIAC CATHETERIZATION N/A 05/12/2015   Procedure: Coronary Stent Intervention;  Surgeon: Troy Sine, MD;  Location: [place] [facility];  Service: Cardiovascular;  Laterality: N/A;  . CARDIOVERSION N/A 02/01/2015   Procedure: CARDIOVERSION;  Surgeon: Lelon Perla, MD;  Location: [facility] ENDOSCOPY;  Service: Cardiovascular;  Laterality: N/A;  . CARDIOVERSION N/A 08/30/2015   Procedure: CARDIOVERSION;  Surgeon: Sanda Klein, MD;  Location: [place] ENDOSCOPY;  Service: Cardiovascular;  Laterality: N/A;  . CARPAL TUNNEL RELEASE  2008   "right hand/thumb; carpal tunnel repair; got rid of  arthritis" (01/21/2012)  . CLIPPING OF ATRIAL APPENDAGE N/A 01/04/2015   Procedure: CLIPPING OF ATRIAL APPENDAGE;  Surgeon: Grace Isaac, MD;  Location: [place];  Service: Open Heart Surgery;  Laterality: N/A;  . COLONOSCOPY N/A 12/21/2015   Procedure: COLONOSCOPY;  Surgeon: Milus Banister, MD;  Location: [facility] ENDOSCOPY;  Service: Endoscopy;  Laterality: N/A;  . CORONARY ARTERY BYPASS GRAFT N/A 01/04/2015   Procedure: CORONARY ARTERY BYPASS GRAFTING (CABG) x 3 using left internal mammory artery and greater saphenous vein right leg harvested endoscopically.;  Surgeon: Grace Isaac, MD;  LIMA-LAD, SVG-RI, SVG-PDA  . ESOPHAGOGASTRODUODENOSCOPY (EGD) WITH PROPOFOL N/A 12/18/2015   Procedure: ESOPHAGOGASTRODUODENOSCOPY (EGD) WITH PROPOFOL;  Surgeon: Milus Banister, MD;  Location: [facility] ENDOSCOPY;  Service: Endoscopy;  Laterality: N/A;  . FACELIFT, LOWER 2/3  1995   "mini" (01/21/2012)  . GIVENS CAPSULE STUDY N/A 12/21/2015   Procedure: GIVENS CAPSULE STUDY;  Surgeon: Milus Banister, MD;  Location: [facility] ENDOSCOPY;  Service: Endoscopy;  Laterality: N/A;  . Lower Arterial Examination  10/28/2011   R. SFA stent mild-moderate mixed density plaque with elevated velocities consistent with 50% diameter reduction. L. SFA stent moderate mixed denisty plaque at mid to distal level consistent with 50-69% diameter reduction.  . LOWER EXTREMITY ANGIOGRAPHY N/A 02/15/2017   Procedure: LOWER EXTREMITY ANGIOGRAPHY;  Surgeon: Lorretta Harp, MD;  Location: [place] [facility];  Service: Cardiovascular;  Laterality: N/A;  . OOPHORECTOMY  ~1979  . PARTIAL COLECTOMY  2010  . PERIPHERAL ARTERIAL STENT GRAFT  2012; 2012   "LLE; RLE" (01/21/2012)  . PERIPHERAL VASCULAR BALLOON ANGIOPLASTY Right 02/15/2017   Procedure: PERIPHERAL VASCULAR BALLOON ANGIOPLASTY;  Surgeon: Lorretta Harp, MD;  Location: [place] [facility];  Service: Cardiovascular;  Laterality: Right;  SFA    . PERMANENT PACEMAKER INSERTION N/A 01/21/2012    Medtronic Adapta L implanted by Dr Sallyanne Kuster for tachy/brady syndrome  . POSTERIOR CERVICAL LAMINECTOMY  1985  . TEE WITHOUT CARDIOVERSION N/A 01/04/2015   Procedure: TRANSESOPHAGEAL ECHOCARDIOGRAM (TEE);  Surgeon: Grace Isaac, MD;  Location: [facility];  Service: Open Heart Surgery;  Laterality: N/A;  . TEE WITHOUT CARDIOVERSION N/A 02/01/2015   Procedure: TRANSESOPHAGEAL ECHOCARDIOGRAM (TEE);  Surgeon: Lelon Perla, MD;  Location: [facility] ENDOSCOPY;  Service: Cardiovascular;  Laterality: N/A;  . TEE WITHOUT CARDIOVERSION N/A 08/30/2015   Procedure: TRANSESOPHAGEAL ECHOCARDIOGRAM (TEE);  Surgeon: Sanda Klein, MD;  Location: [facility]COPY;  Service: Cardiovascular;  Laterality: N/A;  . [place] History   Socioeconomic History  . Marital status: Widowed    Spouse name: Not on file  . Number of children: 3  . Years of education: Not on  file  . Highest education level: Not on file  Social Needs  . Financial resource strain: Not on file  . Food insecurity - worry: Not on file  . Food insecurity - inability: Not on file  . Transportation needs - medical: Not on file  . Transportation needs - non-medical: Not on file  Occupational History  . 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 Smoking status: Former Smoker    Packs/day: 0.75    Years: 10.00    Pack years: 7.50    Types: Cigarettes  . Smokeless tobacco: Never Used  . Tobacco comment: 01/21/2012 "quit smoking ~ 2002"  Substance and Sexual Activity  . Alcohol use: Yes    Comment: admits to drinking 3+ drinks last night, and possibly daily  . Drug use: No  . Sexual activity: Not Currently  Other Topics Concern  . Not on file  Social History Narrative   Lives in [place].  Retired.    Family History  Problem Relation Age of Onset  . Heart disease Mother   . Hypertension Mother   . Stroke Mother   . Stroke Father   . Heart disease Father   . Heart disease  Brother   . Hypertension Brother        2 brothers  . CVA Brother        2 brothers  . Heart attack Brother        2 brothers     ROS  Vitals:   03/10/17 1429  BP: 122/72  Pulse: 78  Resp: 17  Temp: 98.2 F (36.8 C)  SpO2: 98%    Physical Exam  Constitutional: She is oriented to person, place, and time. She appears well-developed and well-nourished.  Seems uncomfortable due to pain  HENT:  Head: Normocephalic and atraumatic.  Eyes: Conjunctivae and EOM are normal. Pupils are equal, round, and reactive to light.  Neck: Normal range of motion. Neck supple.  Cardiovascular: Normal rate, regular rhythm and normal heart sounds.  Pulmonary/Chest: Effort normal and breath sounds normal. No respiratory distress. She has no wheezes. She has no rales. She exhibits tenderness (Positive bruising to left posterior lateral mid rib cage).  Abdominal: Soft. There is no tenderness.  Musculoskeletal: Normal range of motion.  Neurological: She is alert and oriented to person, place, and time. No sensory deficit. She exhibits normal muscle tone.  Skin: Skin is warm and dry. Capillary refill takes less than 2 seconds.  Psychiatric: She has a normal mood and affect.  Vitals reviewed.  Dg Ribs Unilateral W/chest Left  Result Date: 03/10/2017 CLINICAL DATA:  Fall 3 weeks ago with persistent left rib pain, initial encounter EXAM: LEFT RIBS AND CHEST - 3+ VIEW COMPARISON:  02/20/2017 FINDINGS: Cardiac shadow is mildly enlarged. Postsurgical changes are noted. Pacing device is again seen and stable. Left breast implant is noted with calcification. Lungs are clear bilaterally. No pneumothorax is seen. Mildly displaced fractures of the left seventh and eighth ribs are noted anterior laterally. IMPRESSION: Mildly displaced left seventh and eighth rib fractures without significant callus formation. No pneumothorax is noted. Electronically Signed   By: Inez Catalina M.D.   On: 03/10/2017 15:19   A total of  25 minutes was spent in the room with the patient, greater than 50% of which was in counseling/coordination of care.   ASSESSMENT & PLAN: Mykael was seen today for rib injury.  Diagnoses and all orders for this visit:  Rib  pain on left side -     DG Ribs Unilateral W/Chest Left; Future -     HYDROcodone-acetaminophen (NORCO) 5-325 MG tablet; Sig 1/2 - 1 tablet every 6-8 hours as needed for moderate pain.  Closed fracture of multiple ribs of left side, sequela  History of rib fracture Comments: 3 weeks ago Orders: -     DG Ribs Unilateral W/Chest Left; Future -     HYDROcodone-acetaminophen (NORCO) 5-325 MG tablet; Sig 1/2 - 1 tablet every 6-8 hours as needed for moderate pain.    Patient Instructions       IF you received an x-ray today, you will receive an invoice from [facility]. Please contact [facility] at [phone] with questions or concerns regarding your invoice.   IF you received labwork today, you will receive an invoice from [facility]. Please contact [facility] at [phone] with questions or concerns regarding your invoice.   Our billing staff will not be able to assist you with questions regarding bills from these companies.  You will be contacted with the lab results as soon as they are available. The fastest way to get your results is to activate your My Chart account. Instructions are located on the last page of this paperwork. If you have not heard from [place] regarding the results in 2 weeks, please contact this office.      Rib Fracture A rib fracture is a break or crack in one of the bones of the ribs. The ribs are like a cage that goes around your upper chest. A broken or cracked rib is often painful, but most do not cause other problems. Most rib fractures heal on their own in 1-3 months. Follow these instructions at home:  Avoid activities that cause pain to the injured area. Protect your injured area.  Slowly increase activity as  told by your doctor.  Take medicine as told by your doctor.  Put ice on the injured area for the first 1-2 days after you have been treated or as told by your doctor. ? Put ice in a plastic bag. ? Place a towel between your skin and the bag. ? Leave the ice on for 15-20 minutes at a time, every 2 hours while you are awake.  Do deep breathing as told by your doctor. You may be told to: ? Take deep breaths many times a day. ? Cough many times a day while hugging a pillow. ? Use a device (incentive spirometer) to perform deep breathing many times a day.  Drink enough fluids to keep your pee (urine) clear or pale yellow.  Do not wear a rib belt or binder. These do not allow you to breathe deeply. Get help right away if:  You have a fever.  You have trouble breathing.  You cannot stop coughing.  You cough up thick or bloody spit (mucus).  You feel sick to your stomach (nauseous), throw up (vomit), or have belly (abdominal) pain.  Your pain gets worse and medicine does not help. This information is not intended to replace advice given to you by your health care provider. Make sure you discuss any questions you have with your health care provider. Document Released: 11/05/2007 Document Revised: 07/04/2015 Document Reviewed: 03/30/2012 Elsevier Interactive Patient Education  2018 Elsevier Inc.      Agustina Caroli, MD [facility]

## 2017-03-10 NOTE — Patient Instructions (Addendum)
     IF you received an x-ray today, you will receive an invoice from [facility]. Please contact [facility] at [phone] with questions or concerns regarding your invoice.   IF you received labwork today, you will receive an invoice from [facility]. Please contact [facility] at [phone] with questions or concerns regarding your invoice.   Our billing staff will not be able to assist you with questions regarding bills from these companies.  You will be contacted with the lab results as soon as they are available. The fastest way to get your results is to activate your My Chart account. Instructions are located on the last page of this paperwork. If you have not heard from us regarding the results in 2 weeks, please contact this office.     Rib Fracture A rib fracture is a break or crack in one of the bones of the ribs. The ribs are like a cage that goes around your upper chest. A broken or cracked rib is often painful, but most do not cause other problems. Most rib fractures heal on their own in 1-3 months. Follow these instructions at home:  Avoid activities that cause pain to the injured area. Protect your injured area.  Slowly increase activity as told by your doctor.  Take medicine as told by your doctor.  Put ice on the injured area for the first 1-2 days after you have been treated or as told by your doctor. ? Put ice in a plastic bag. ? Place a towel between your skin and the bag. ? Leave the ice on for 15-20 minutes at a time, every 2 hours while you are awake.  Do deep breathing as told by your doctor. You may be told to: ? Take deep breaths many times a day. ? Cough many times a day while hugging a pillow. ? Use a device (incentive spirometer) to perform deep breathing many times a day.  Drink enough fluids to keep your pee (urine) clear or pale yellow.  Do not wear a rib belt or binder. These do not allow you to breathe deeply. Get help right away  if:  You have a fever.  You have trouble breathing.  You cannot stop coughing.  You cough up thick or bloody spit (mucus).  You feel sick to your stomach (nauseous), throw up (vomit), or have belly (abdominal) pain.  Your pain gets worse and medicine does not help. This information is not intended to replace advice given to you by your health care provider. Make sure you discuss any questions you have with your health care provider. Document Released: 11/05/2007 Document Revised: 07/04/2015 Document Reviewed: 03/30/2012 Elsevier Interactive Patient Education  2018 Elsevier Inc.  

## 2017-03-12 ENCOUNTER — Encounter: Payer: Self-pay | Admitting: Cardiovascular Disease

## 2017-03-12 ENCOUNTER — Ambulatory Visit: Payer: Medicare HMO | Admitting: Cardiovascular Disease

## 2017-03-12 ENCOUNTER — Ambulatory Visit (HOSPITAL_COMMUNITY)
Admission: RE | Admit: 2017-03-12 | Discharge: 2017-03-12 | Disposition: A | Discharge status: Home / Self Care | Payer: Medicare HMO | Source: Ambulatory Visit | Attending: Cardiovascular Disease | Admitting: Cardiovascular Disease

## 2017-03-12 VITALS — BP 157/92 | HR 79 | Wt 144.0 lb

## 2017-03-12 DIAGNOSIS — I2510 Atherosclerotic heart disease of native coronary artery without angina pectoris: Secondary | ICD-10-CM | POA: Diagnosis not present

## 2017-03-12 DIAGNOSIS — I739 Peripheral vascular disease, unspecified: Secondary | ICD-10-CM | POA: Diagnosis not present

## 2017-03-12 DIAGNOSIS — E785 Hyperlipidemia, unspecified: Secondary | ICD-10-CM | POA: Insufficient documentation

## 2017-03-12 DIAGNOSIS — Z87891 Personal history of nicotine dependence: Secondary | ICD-10-CM | POA: Diagnosis not present

## 2017-03-12 DIAGNOSIS — I10 Essential (primary) hypertension: Secondary | ICD-10-CM | POA: Diagnosis not present

## 2017-03-12 NOTE — Assessment & Plan Note (Signed)
History of PAD status post bilateral SFA intervention by myself back in 2012. Because of recurrent claudication and abnormal Doppler studies show underwent peripheral angiography by myself 02/15/17 revealing patent mid SFA stents with 30-40% "in-stent restenosis bilaterally and three-vessel runoff. She did have a 90% fairly focal proximal right SFA stenosis which underwent intervention with directional atherectomy followed by drug-eluting balloon angioplasty. Her follow-up Dopplers were markedly improved with normal ABIs. She is on Eliquis  for PAF and has stopped her low-dose aspirin. She can continue Plavix for a total of 3 months and then discontinue this as well. We will get lower extremity arterial Doppler studies every 6 months and I will see her back in one year for follow-up.

## 2017-03-12 NOTE — Progress Notes (Signed)
03/12/2017 [name]   [birth year]  [id number]  Primary Physician Rutherford Guys, MD Primary Cardiologist: Lorretta Harp MD Garret Reddish, [place], [place]  HPI:  [name] is a 77 y.o.  moderately overweight engaged Caucasian female mother of 67, grandmother and 6 grandchildren who was referred by Dr. Sallyanne Kuster for peripheral vascular evaluation because of lifestyle limiting claudication. I last saw her in the office 02/10/17. She does have a history of PAF having undergone ablation at [facility] a month ago. She is on Tikosyn and Eliquis  however she has not taken her Eliquis several weeks. She has a history of CABG 3 in 2016 and subsequent stent procedures because of early graft failure. She also has PAD status post right left SFA intervention in a staged fashion in 2012 by myself. Over the last year she's had recurrent right lower extreme claudication with Dopplers to suggest a high-frequency signal in her proximal right SFA.  I performed peripheral angiography on her 02/15/17 revealing patent mid SFA stents bilaterally with 30-40% "in-stent restenosis and three-vessel runoff. She did have a focal proximal right SFA stenosis which I performed directional atherectomy and drug-eluting balloon angioplasty on with an excellent result. Her follow-up lower extremity arterial Doppler studies performed on office 03/12/17 revealed normal ABIs bilaterally with no evidence of obstructive disease. Her claudication has resolved.    Current Meds  Medication Sig  . alendronate (FOSAMAX) 70 MG tablet Take 70 mg by mouth every Thursday.   Marland Kitchen apixaban (ELIQUIS) 5 MG TABS tablet Take 1 tablet (5 mg total) by mouth 2 (two) times daily.  . carvedilol (COREG) 12.5 MG tablet Take 1 tablet (12.5 mg total) by mouth 2 (two) times daily with a meal.  . cholecalciferol (VITAMIN D) 1000 UNITS tablet Take 1,000 Units by mouth 2 (two) times a week.   . citalopram (CELEXA) 20 MG tablet Take 0.5 tablets (10 mg total) by  mouth daily.  . clopidogrel (PLAVIX) 75 MG tablet Take 1 tablet (75 mg total) by mouth daily with breakfast.  . Coenzyme Q10 (CO Q-10) 120 MG CAPS Take 120 mg by mouth at bedtime.  . dofetilide (TIKOSYN) 250 MCG capsule Take 250 mcg by mouth 2 (two) times daily. (0900 & 2100)  . furosemide (LASIX) 40 MG tablet Take 1 tablet (40 mg total) by mouth as needed. (Patient taking differently: Take 40 mg by mouth daily as needed (for fluid retention/swelling.). )  . levothyroxine (SYNTHROID, LEVOTHROID) 25 MCG tablet Take 25-50 mcg by mouth daily before breakfast. Take 1 tablet (25 mcg) by mouth Monday through Thursday, then take 2 tablets (50 mcg) on Friday, Saturday, & Sunday.  Marland Kitchen LORazepam (ATIVAN) 1 MG tablet Take 0.5 tablets (0.5 mg total) by mouth every 8 (eight) hours as needed (muscle spasm).  Marland Kitchen MAGNESIUM CITRATE PO Take 400 mg by mouth daily.  . Multiple Vitamin (MULTIVITAMIN WITH MINERALS) TABS tablet Take 1 tablet by mouth 2 (two) times daily.  . nitrofurantoin, macrocrystal-monohydrate, (MACROBID) 100 MG capsule Take 1 capsule (100 mg total) by mouth 2 (two) times daily.  . nitroGLYCERIN (NITROSTAT) 0.4 MG SL tablet Place 1 tablet (0.4 mg total) under the tongue every 5 (five) minutes x 3 doses as needed for chest pain.  . Omega-3 Fatty Acids (SUPER OMEGA-3) 1000 MG CAPS Take 1,000 mg by mouth at bedtime.   Vladimir Faster Glycol-Propyl Glycol (LUBRICANT EYE DROPS) 0.4-0.3 % SOLN Apply 1-2 drops to eye 3 (three) times daily as needed (  for dry/irritated eyes).     Allergies  Allergen Reactions  . Brilinta [Ticagrelor] Shortness Of Breath  . Clonidine Derivatives Other (See Comments)    Lowers heart rate  . Atorvastatin Other (See Comments)    Possible cause of fatigue/malaise  . Crestor [Rosuvastatin Calcium] Other (See Comments)    Joint/muscle aches  . Exforge [Amlodipine Besylate-Valsartan] Itching and Rash  . Spironolactone Other (See Comments)    Contraindicated with history of  hyperkalemia    Social History   Socioeconomic History  . Marital status: Widowed    Spouse name: Not on file  . Number of children: 3  . Years of education: Not on file  . Highest education level: Not on file  Social Needs  . Financial resource strain: Not on file  . Food insecurity - worry: Not on file  . Food insecurity - inability: Not on file  . Transportation needs - medical: Not on file  . Transportation needs - non-medical: Not on file  Occupational History  . 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 Smoking status: Former Smoker    Packs/day: 0.75    Years: 10.00    Pack years: 7.50    Types: Cigarettes  . Smokeless tobacco: Never Used  . Tobacco comment: 01/21/2012 "quit smoking ~ 2002"  Substance and Sexual Activity  . Alcohol use: Yes    Comment: admits to drinking 3+ drinks last night, and possibly daily  . Drug use: No  . Sexual activity: Not Currently  Other Topics Concern  . Not on file  Social History Narrative   Lives in [place].  Retired.     Review of Systems: General: negative for chills, fever, night sweats or weight changes.  Cardiovascular: negative for chest pain, dyspnea on exertion, edema, orthopnea, palpitations, paroxysmal nocturnal dyspnea or shortness of breath Dermatological: negative for rash Respiratory: negative for cough or wheezing Urologic: negative for hematuria Abdominal: negative for nausea, vomiting, diarrhea, bright red blood per rectum, melena, or hematemesis Neurologic: negative for visual changes, syncope, or dizziness All other systems reviewed and are otherwise negative except as noted above.    Blood pressure (!) 157/92, pulse 79, weight 144 lb (65.3 kg).  General appearance: alert and no distress Neck: no adenopathy, no carotid bruit, no JVD, supple, symmetrical, trachea midline and thyroid not enlarged, symmetric, no tenderness/mass/nodules Lungs: clear to auscultation  bilaterally Heart: regular rate and rhythm, S1, S2 normal, no murmur, click, rub or gallop Extremities: extremities normal, atraumatic, no cyanosis or edema Pulses: 2+ and symmetric Skin: Skin color, texture, turgor normal. No rashes or lesions Neurologic: Alert and oriented X 3, normal strength and tone. Normal symmetric reflexes. Normal coordination and gait  EKG Not performed today  ASSESSMENT AND PLAN:   Claudication in peripheral vascular disease ([place]) History of PAD status post bilateral SFA intervention by myself back in 2012. Because of recurrent claudication and abnormal Doppler studies show underwent peripheral angiography by myself 02/15/17 revealing patent mid SFA stents with 30-40% "in-stent restenosis bilaterally and three-vessel runoff. She did have a 90% fairly focal proximal right SFA stenosis which underwent intervention with directional atherectomy followed by drug-eluting balloon angioplasty. Her follow-up Dopplers were markedly improved with normal ABIs. She is on Eliquis  for PAF and has stopped her low-dose aspirin. She can continue Plavix for a total of 3 months and then discontinue this as well. We will get lower extremity arterial Doppler studies every 6 months and I will  see her back in one year for follow-up.      Lorretta Harp MD FACP,FACC,FAHA, [facility] 03/12/2017 4:40 PM

## 2017-03-12 NOTE — Patient Instructions (Signed)
Medication Instructions: Your physician recommends that you continue on your current medications as directed. Please refer to the Current Medication list given to you today.  OK to stop Plavix in 3 months.   Testing/Procedures:  EVERY 6 MONTHS: Your physician has requested that you have a lower extremity arterial duplex. During this test, ultrasound is used to evaluate arterial blood flow in the legs. Allow one hour for this exam. There are no restrictions or special instructions.  Your physician has requested that you have an ankle brachial index (ABI). During this test an ultrasound and blood pressure cuff are used to evaluate the arteries that supply the arms and legs with blood. Allow thirty minutes for this exam. There are no restrictions or special instructions.   Follow-Up: Your physician wants you to follow-up in: 1 year with Dr. Gwenlyn Found. You will receive a reminder letter in the mail two months in advance. If you don't receive a letter, please call our office to schedule the follow-up appointment.  If you need a refill on your cardiac medications before your next appointment, please call your pharmacy.

## 2017-03-15 NOTE — Progress Notes (Signed)
Thanks, Jonathan 

## 2017-03-16 ENCOUNTER — Telehealth: Payer: Self-pay | Admitting: Cardiovascular Disease

## 2017-03-16 NOTE — Telephone Encounter (Signed)
Returned call to patient of Dr. Loletha Grayer (scheduled for 03/18/17)  She reports weakness and numbness. She has had some paralysis since last night. She couldn't think of things. She could speak/think. She reports this was an acute episode. She is having a difficult time communicating. She also reports dropping things out of her left hand. Explained that her symptoms are concerning for TIA/stroke. Fiance states that patient has had a mini-stroke before. Advised an ED eval would be most appropriate to assess symptoms but they wished to have MD opinion.   Spoke with Dr. Loletha Grayer, who recommends ED eval for acute symptoms. He will see patient in office on Thursday otherwise.   Explained to patient/fiance the recommendations but they are refusing ED eval at present time, stating they do not want to wait and that ultimately ti is their (the patient's) decision.   Informed them they can call for assistance between now and appointment if needed.

## 2017-03-16 NOTE — Telephone Encounter (Signed)
Pt says she just does not feel right,she feels weak and numbness in her hands and arms. I made her an appt for Thursday with Dr C.Please call to evaluate.

## 2017-03-17 DIAGNOSIS — E213 Hyperparathyroidism, unspecified: Secondary | ICD-10-CM | POA: Diagnosis not present

## 2017-03-17 DIAGNOSIS — E559 Vitamin D deficiency, unspecified: Secondary | ICD-10-CM | POA: Diagnosis not present

## 2017-03-17 DIAGNOSIS — E038 Other specified hypothyroidism: Secondary | ICD-10-CM | POA: Diagnosis not present

## 2017-03-18 ENCOUNTER — Encounter: Payer: Self-pay | Admitting: Cardiovascular Disease

## 2017-03-18 ENCOUNTER — Ambulatory Visit: Payer: Medicare HMO | Admitting: Cardiovascular Disease

## 2017-03-18 VITALS — BP 137/83 | HR 78 | Resp 16 | Ht 65.0 in | Wt 147.2 lb

## 2017-03-18 DIAGNOSIS — R296 Repeated falls: Secondary | ICD-10-CM

## 2017-03-18 DIAGNOSIS — R29898 Other symptoms and signs involving the musculoskeletal system: Secondary | ICD-10-CM | POA: Diagnosis not present

## 2017-03-18 DIAGNOSIS — I739 Peripheral vascular disease, unspecified: Secondary | ICD-10-CM | POA: Diagnosis not present

## 2017-03-18 DIAGNOSIS — I480 Paroxysmal atrial fibrillation: Secondary | ICD-10-CM | POA: Diagnosis not present

## 2017-03-18 DIAGNOSIS — I5032 Chronic diastolic (congestive) heart failure: Secondary | ICD-10-CM

## 2017-03-18 DIAGNOSIS — Z5181 Encounter for therapeutic drug level monitoring: Secondary | ICD-10-CM | POA: Diagnosis not present

## 2017-03-18 DIAGNOSIS — R471 Dysarthria and anarthria: Secondary | ICD-10-CM

## 2017-03-18 DIAGNOSIS — I25810 Atherosclerosis of coronary artery bypass graft(s) without angina pectoris: Secondary | ICD-10-CM | POA: Diagnosis not present

## 2017-03-18 DIAGNOSIS — Z7901 Long term (current) use of anticoagulants: Secondary | ICD-10-CM | POA: Diagnosis not present

## 2017-03-18 DIAGNOSIS — Z79899 Other long term (current) drug therapy: Secondary | ICD-10-CM

## 2017-03-18 DIAGNOSIS — I10 Essential (primary) hypertension: Secondary | ICD-10-CM | POA: Diagnosis not present

## 2017-03-18 DIAGNOSIS — E213 Hyperparathyroidism, unspecified: Secondary | ICD-10-CM | POA: Diagnosis not present

## 2017-03-18 DIAGNOSIS — Z950 Presence of cardiac pacemaker: Secondary | ICD-10-CM | POA: Diagnosis not present

## 2017-03-18 NOTE — Progress Notes (Signed)
Patient ID: ELBA SCHABER, female   DOB: 06/30/1940, 77 y.o.   MRN: [id number]     Cardiology Office Note    Date:  03/18/2017   ID:  VANIYA AUGSPURGER, DOB 1940-06-22, MRN [id number]  PCP:  Rutherford Guys, MD  Cardiologist:   Sanda Klein, MD   Chief Complaint  Patient presents with  . Congestive Heart Failure    weak, fell went to hospital, diziness,sob  . Stroke Symptoms    memory loss, splurr speach, left side feel dropped     History of Present Illness:  [name] is a 77 y.o. female with coronary artery disease s/p bypass surgery (November 2016) and placement of drug-eluting stents (DES to distal SVG-RCA, April 2017 ) and left atrial appendage clipping, symptomatic paroxysmal atrial fibrillation and atrial flutter with recent radiofrequency ablation (caval tricuspid isthmus and pulmonary vein isolation), PAD with previous stents, history of GI bleeding while on anticoagulation, volatile hypertension.  She presents with complaints of weakness in her left hand and difficulty speaking that occurred yesterday.  When she called on the phone she was advised to go to the emergency room but she declined.  The difficulty speaking is poorly defined and  sounds more like difficulty finding her words rather than true dysarthria or expressive a aphasia.  She noticed that she keeps dropping things from her left hand.  She has not had any noticeable weakness in her left leg, but she has fallen several times.  She has had 2 serious falls.  About a month ago she fell in her bathtub and has fractured a couple of ribs in her left anterior chest.  She is not sure, but believes that she may have lost consciousness before she fell.  She feels very tired all the time.  Gets in bed many times throughout the day because of extreme fatigue.    She is on both clopidogrel and Eliquis and has not had any overt bleeding problems.  She does not appear pale and had a hemoglobin of 12.8 when she presented for her fall  early January.  She only takes the furosemide at most once a week and not every week for occasional problems with leg edema. She denies any current problems with dyspnea or angina either at rest or with activity, orthopnea, PND, palpitations, syncope or recent GI bleeding.  Intermittent claudication has improved since her last procedure with Dr. Gwenlyn Found.  She has had a durable and remarkably beneficial effect from her ablation procedure last year with a marked reduction in the burden of atrial fibrillation.  On her current pacemaker check today she is only had 9 episodes of mode switch, all of them less than a minute in duration.  Her dual chamber pacemaker is programmed DDDR with a lower rate limit of 75 bpm and she has 97% atrial pacing and only 0.4% ventricular pacing.  Lead parameters are excellent.  The device was implanted in 2013.  Battery status is good still roughly 7-8 years of longevity.,   Amiodarone caused hypothyroidism and alopecia and she has not taken that medication since April. She has tried taking statins but has repeatedly stopped them due to musculoskeletal problems. Most recently she was intolerant to atorvastatin and rosuvastatin. She is currently not taking any lipid-lowering agents.  She has normal left ventricular systolic function. She does have evidence of grade 2 diastolic dysfunction on previous echo. At the time of her bypass surgery she received an Atricure left atrial appendage clip. She  is also on clopidogrel following her drug-eluting stents in April 2017. The pacemaker was implanted in 2013 for symptomatic sinus bradycardia. She also has a history of peripheral arterial disease and received bilateral superficial femoral artery stents about 3 years ago. She has never smoked but drinks daily. She has treated hypertension.  After undergoing bypass surgery in November 2016 she returned with non-ST segment elevation myocardial infarction in April 2017 and required placement of  stents both in the native right coronary artery (3.020 mm Synergy DES) and in the saphenous vein graft to the distal right coronary artery (2.7538 mm Synergy DES) due to early graft dysfunction. The saphenous vein graft to the ramus intermedius was also occluded, but this vessel was left for medical therapy.    Past Medical History:  Diagnosis Date  . ANXIETY 12/31/2009  . Aortic insufficiency    a. mod by echo 2017.  Marland Kitchen CAD (coronary artery disease)    a. s/p CABGx3 and LAA clipping in 12/2014, NSTEMI 05/2015 s/p DES to native RCA and SVG-dRCA; occluded ramus-SVG was treated medically). c. neg nuc 10/2015 at [facility].  . Chronic diastolic CHF (congestive heart failure) ([place])   . Chronic fatigue   . CKD (chronic kidney disease), stage II   . DEPRESSION 12/31/2009  . [place] DISEASE, CERVICAL 12/31/2009  . [place] DISEASE, LUMBAR 12/31/2009  . DIVERTICULITIS, HX OF 12/31/2009  . Essential hypertension   . GLUCOSE INTOLERANCE 12/31/2009  . Habitual alcohol use   . Hypercalcemia   . Hyperkalemia   . Hyperlipidemia 12/31/2009  . MENOPAUSE, EARLY 12/31/2009  . NSTEMI (non-ST elevated myocardial infarction) ([place]) 05/11/2015  . Orthostatic hypotension   . OSTEOARTHRITIS, HAND   . Pacemaker 01/21/2012   MDT Adapta dual chamber  . Paroxysmal atrial flutter ([place])   . Persistent atrial fibrillation ([place])   . Pre-diabetes    PATIENT DENIES  . PVD (peripheral vascular disease), hx stents to bil SFAs 02/2010   . Symptomatic sinus bradycardia 01/22/2012  . Syncope    a. 11/2015 ? due to medications.  . Tachy-brady syndrome ([place])    a. s/p MDT PPM 2013.  . Tricuspid regurgitation     Past Surgical History:  Procedure Laterality Date  . ABDOMINAL AORTOGRAM W/LOWER EXTREMITY Bilateral 02/15/2017   Procedure: ABDOMINAL AORTOGRAM W/LOWER EXTREMITY;  Surgeon: Lorretta Harp, MD;  Location: [facility];  Service: Cardiovascular;  Laterality: Bilateral;  bilat  . AUGMENTATION MAMMAPLASTY  1983  .  CARDIAC CATHETERIZATION N/A 01/01/2015   Procedure: Left Heart Cath and Coronary Angiography;  Surgeon: Jettie Booze, MD;  Location: [place] [facility];  Service: Cardiovascular;  Laterality: N/A;  . CARDIAC CATHETERIZATION N/A 05/12/2015   Procedure: Left Heart Cath and Coronary Angiography;  Surgeon: Troy Sine, MD;  Location: [place];  Service: Cardiovascular;  Laterality: N/A;  . CARDIAC CATHETERIZATION N/A 05/12/2015   Procedure: Coronary Stent Intervention;  Surgeon: Troy Sine, MD;  Location: [facility];  Service: Cardiovascular;  Laterality: N/A;  . CARDIOVERSION N/A 02/01/2015   Procedure: CARDIOVERSION;  Surgeon: Lelon Perla, MD;  Location: [facility]OSCOPY;  Service: Cardiovascular;  Laterality: N/A;  . CARDIOVERSION N/A 08/30/2015   Procedure: CARDIOVERSION;  Surgeon: Sanda Klein, MD;  Location: [facility];  Service: Cardiovascular;  Laterality: N/A;  . CARPAL TUNNEL RELEASE  2008   "right hand/thumb; carpal tunnel repair; got rid of arthritis" (01/21/2012)  . CLIPPING OF ATRIAL APPENDAGE N/A 01/04/2015   Procedure: CLIPPING OF ATRIAL APPENDAGE;  Surgeon: Grace Isaac, MD;  Location: [facility];  Service: Open Heart Surgery;  Laterality: N/A;  . COLONOSCOPY N/A 12/21/2015   Procedure: COLONOSCOPY;  Surgeon: Milus Banister, MD;  Location: [facility]OSCOPY;  Service: Endoscopy;  Laterality: N/A;  . CORONARY ARTERY BYPASS GRAFT N/A 01/04/2015   Procedure: CORONARY ARTERY BYPASS GRAFTING (CABG) x 3 using left internal mammory artery and greater saphenous vein right leg harvested endoscopically.;  Surgeon: Grace Isaac, MD;  LIMA-LAD, SVG-RI, SVG-PDA  . ESOPHAGOGASTRODUODENOSCOPY (EGD) WITH PROPOFOL N/A 12/18/2015   Procedure: ESOPHAGOGASTRODUODENOSCOPY (EGD) WITH PROPOFOL;  Surgeon: Milus Banister, MD;  Location: [facility];  Service: Endoscopy;  Laterality: N/A;  . FACELIFT, LOWER 2/3  1995   "mini" (01/21/2012)  . GIVENS CAPSULE STUDY N/A  12/21/2015   Procedure: GIVENS CAPSULE STUDY;  Surgeon: Milus Banister, MD;  Location: [facility];  Service: Endoscopy;  Laterality: N/A;  . Lower Arterial Examination  10/28/2011   R. SFA stent mild-moderate mixed density plaque with elevated velocities consistent with 50% diameter reduction. L. SFA stent moderate mixed denisty plaque at mid to distal level consistent with 50-69% diameter reduction.  . LOWER EXTREMITY ANGIOGRAPHY N/A 02/15/2017   Procedure: LOWER EXTREMITY ANGIOGRAPHY;  Surgeon: Lorretta Harp, MD;  Location: [facility];  Service: Cardiovascular;  Laterality: N/A;  . OOPHORECTOMY  ~1979  . PARTIAL COLECTOMY  2010  . PERIPHERAL ARTERIAL STENT GRAFT  2012; 2012   "LLE; RLE" (01/21/2012)  . PERIPHERAL VASCULAR BALLOON ANGIOPLASTY Right 02/15/2017   Procedure: PERIPHERAL VASCULAR BALLOON ANGIOPLASTY;  Surgeon: Lorretta Harp, MD;  Location: [facility];  Service: Cardiovascular;  Laterality: Right;  SFA    . PERMANENT PACEMAKER INSERTION N/A 01/21/2012   Medtronic Adapta L implanted by Dr Sallyanne Kuster for tachy/brady syndrome  . POSTERIOR CERVICAL LAMINECTOMY  1985  . TEE WITHOUT CARDIOVERSION N/A 01/04/2015   Procedure: TRANSESOPHAGEAL ECHOCARDIOGRAM (TEE);  Surgeon: Grace Isaac, MD;  Location: [facility];  Service: Open Heart Surgery;  Laterality: N/A;  . TEE WITHOUT CARDIOVERSION N/A 02/01/2015   Procedure: TRANSESOPHAGEAL ECHOCARDIOGRAM (TEE);  Surgeon: Lelon Perla, MD;  Location[facility] ENDOSCOPY;  Service: Cardiovascular;  Laterality: N/A;  . TEE WITHOUT CARDIOVERSION N/A 08/30/2015   Procedure: TRANSESOPHAGEAL ECHOCARDIOGRAM (TEE);  Surgeon: Sanda Klein, MD;  Location[facility] END[facility]COPY;  Service: Cardiovascular;  Laterality: N/A;  . VAGINAL HYSTERECTOMY  1975    Current Medications: Outpatient Medications Prior to Visit  Medication Sig Dispense Refill  . alendronate (FOSAMAX) 70 MG tablet Take 70 mg by mouth every Thursday.     Marland Kitchen apixaban (ELIQUIS) 5  MG TABS tablet Take 1 tablet (5 mg total) by mouth 2 (two) times daily. 60 tablet 3  . carvedilol (COREG) 12.5 MG tablet Take 1 tablet (12.5 mg total) by mouth 2 (two) times daily with a meal. 180 tablet 3  . cholecalciferol (VITAMIN D) 1000 UNITS tablet Take 1,000 Units by mouth 2 (two) times a week.     . citalopram (CELEXA) 20 MG tablet Take 0.5 tablets (10 mg total) by mouth daily.    . clopidogrel (PLAVIX) 75 MG tablet Take 1 tablet (75 mg total) by mouth daily with breakfast. 30 tablet 0  . Coenzyme Q10 (CO Q-10) 120 MG CAPS Take 120 mg by mouth at bedtime.    . dofetilide (TIKOSYN) 250 MCG capsule Take 250 mcg by mouth 2 (two) times daily. (0900 & 2100)    . furosemide (LASIX) 40 MG tablet Take 1 tablet (40  mg total) by mouth as needed. (Patient taking differently: Take 40 mg by mouth daily as needed (for fluid retention/swelling.). ) 90 tablet 0  . levothyroxine (SYNTHROID, LEVOTHROID) 25 MCG tablet Take 25-50 mcg by mouth daily before breakfast. Take 1 tablet (25 mcg) by mouth Monday through Thursday, then take 2 tablets (50 mcg) on Friday, Saturday, & Sunday.    Marland Kitchen MAGNESIUM CITRATE PO Take 400 mg by mouth daily.    . Multiple Vitamin (MULTIVITAMIN WITH MINERALS) TABS tablet Take 1 tablet by mouth 2 (two) times daily.    . nitroGLYCERIN (NITROSTAT) 0.4 MG SL tablet Place 1 tablet (0.4 mg total) under the tongue every 5 (five) minutes x 3 doses as needed for chest pain. 25 tablet 2  . Omega-3 Fatty Acids (SUPER OMEGA-3) 1000 MG CAPS Take 1,000 mg by mouth at bedtime.     Marland Kitchen LORazepam (ATIVAN) 1 MG tablet Take 0.5 tablets (0.5 mg total) by mouth every 8 (eight) hours as needed (muscle spasm). 10 tablet 0  . nitrofurantoin, macrocrystal-monohydrate, (MACROBID) 100 MG capsule Take 1 capsule (100 mg total) by mouth 2 (two) times daily. 14 capsule 0  . Polyethyl Glycol-Propyl Glycol (LUBRICANT EYE DROPS) 0.4-0.3 % SOLN Apply 1-2 drops to eye 3 (three) times daily as needed (for dry/irritated eyes).      No facility-administered medications prior to visit.      Allergies:   Brilinta [ticagrelor]; Clonidine derivatives; Atorvastatin; Crestor [rosuvastatin calcium]; Exforge [amlodipine besylate-valsartan]; and Spironolactone   Social History   Socioeconomic History  . Marital status: Widowed    Spouse name: None  . Number of children: 3  . Years of education: None  . Highest education level: None  Social Needs  . Financial resource strain: None  . Food insecurity - worry: None  . Food insecurity - inability: None  . Transportation needs - medical: None  . Transportation needs - non-medical: None  Occupational History  . 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 Smoking status: Former Smoker    Packs/day: 0.75    Years: 10.00    Pack years: 7.50    Types: Cigarettes  . Smokeless tobacco: Never Used  . Tobacco comment: 01/21/2012 "quit smoking ~ 2002"  Substance and Sexual Activity  . Alcohol use: Yes    Comment: admits to drinking 3+ drinks last night, and possibly daily  . Drug use: No  . Sexual activity: Not Currently  Other Topics Concern  . None  Social History Narrative   Lives in [place].  Retired.     Family History:  The patient's family history includes CVA in her brother; Heart attack in her brother; Heart disease in her brother, father, and mother; Hypertension in her brother and mother; Stroke in her father and mother.   ROS:   Please see the history of present illness.    ROS All other systems reviewed and are negative.   PHYSICAL EXAM:   VS:  BP 137/83   Pulse 78   Resp 16   Ht 5\' 5"  (1.651 m)   Wt 147 lb 3.2 oz (66.8 kg)   SpO2 95%   BMI 24.50 kg/m     GEN: Well nourished, well developed, in no acute distress   General: Alert, oriented x3, no distress Head: no evidence of trauma, PERRL, EOMI, no exophtalmos or lid lag, no myxedema, no xanthelasma; normal ears, nose and oropharynx Neck: normal jugular  venous pulsations and no hepatojugular reflux; brisk  carotid pulses without delay and no carotid bruits Chest: clear to auscultation, no signs of consolidation by percussion or palpation, normal fremitus, symmetrical and full respiratory excursions Cardiovascular: normal position and quality of the apical impulse, regular rhythm, normal first and second heart sounds, no murmurs, rubs or gallops Abdomen: no tenderness or distention, no masses by palpation, no abnormal pulsatility or arterial bruits, normal bowel sounds, no hepatosplenomegaly Extremities: no clubbing, cyanosis or edema; 2+ radial, ulnar and brachial pulses bilaterally; 2+ right femoral, posterior tibial and dorsalis pedis pulses; 2+ left femoral, posterior tibial and dorsalis pedis pulses; no subclavian or femoral bruits Neurological: grossly nonfocal.  Cranial nerves II through XII were tested and appear normal.  There is no facial asymmetry.  She has normokinetic reflexes in the bicipital and patellar's bilaterally.  Strength is 5/5 and appears equal in the right and left upper extremities, making some allows for the dominant right hand.  There is no gross evidence of visual field cut by bedside evaluation. Psych: She appears anxious and a little depressed  Wt Readings from Last 3 Encounters:  03/18/17 147 lb 3.2 oz (66.8 kg)  03/12/17 144 lb (65.3 kg)  03/10/17 143 lb (64.9 kg)      Studies/Labs Reviewed:   EKG:  EKG is ordered today.  Atrial paced, ventricular sensed rhythm with a AV delay of 250 ms, old ST segment depression in leads V4-V6, QTC normal at 417 ms.   Recent Labs: 05/30/2016: B Natriuretic Peptide 864.9; TSH 3.302 10/14/2016: Magnesium 2.2; NT-Pro BNP 991 02/16/2017: BUN 11; Creatinine, Ser 0.89; Hemoglobin 12.8; Platelets 165; Potassium 4.8; Sodium 137   Lipid Panel    Component Value Date/Time   CHOL 158 05/12/2015 0324   TRIG 114 05/12/2015 0324   HDL 64 05/12/2015 0324   CHOLHDL 2.5 05/12/2015 0324   VLDL  23 05/12/2015 0324   LDLCALC 71 05/12/2015 0324   10/04/2015  BUN 38 creatinine 1.46, Ca 11.6, K 5.8.  Total cholesterol 244, triglycerides 195, HDL 63, LDL 142 10/15/2015  creatinine 1.05, BUN 23, K 6.3, Ca 11.6, ionized calcium  6.0. Intact PTH 83. TSH of 4.88,  free T4 of 1.13. 09/09/2016 Creatinine 1.08, BUN 18, potassium 5.1  ASSESSMENT:    No diagnosis found.   PLAN:  In order of problems listed above:  1. TIA?:  I find no residual neurological abnormalities on today's exam, but yesterday's event could have represented a transient ischemic attack.  She is on both antiplatelet and anticoagulation therapy and has not had any recent instrumentation of her thoracic aorta.  We will order a CT of the head and refer to neurology.   2. PPM: She has a Medtronic Adapta device and 5076 atrial and ventricular leads.  Although her pacemaker generator is not formally an MRI conditional device, with appropriate precautions we should be able to perform a brain MRI if neurology thinks it is important for diagnostic evaluation.  Normal device function. Remote download every 3 months and yearly office visits. 3. CHF: Euvolemic clinically today, asymptomati.  Currently she is only taking her furosemide on a very infrequent basis.  Higher than the estimated dry weight of around 135 pounds (Seemed to be "dry" at 130 pounds and was in heart failure at 145 pounds in the past), but dyspnea is not a prominent complaint and it is possible she has green some true weight) . Preserved left ventricular systolic function. NYHA class 2.  4. CAD: Has not had angina pectoris in a long time.  Currently without symptoms of angina pectoris. Unfortunately, she had early failure of 2 saphenous vein grafts, one of which was treated with a drug-eluting stent. Her mammary artery bypass was widely patent.. 5. AFib/flutter: Remarkable abolition of arrhythmia following ablation and initiation of dofetilide. She is also on a intermediate  dose of carvedilol. She has an atrial appendage clip and is on Eliquis also.  6. Dofetilide: QTC is well within normal range. Reminded her about the risks of drug interactions with other QT prolonging agents. 7. Eliquis:  I would think she is at low risk for embolic events since she is compliant with this anticoagulant as well as having had atrial appendage clipping.  She is also on clopidogrel due to her recent lower extremity arterial procedure. Has had a couple of flls, but without head injury. 8. PAD: Improvement in intermittent claudication after her recent procedure with Dr. Gwenlyn Found 9. HTN: Her blood pressure should always be checked in the right upper extremity. Her ACE inhibitor has been stopped due to hyperkalemia. No changes made to blood pressure medicines today. 10. Hypercalcemia: Labs suggested she has primary hyperparathyroidism and she had high normal calcium levels on labs performed less than a month ago.    Medication Adjustments/Labs and Tests Ordered: Current medicines are reviewed at length with the patient today.  Concerns regarding medicines are outlined above.  Medication changes, Labs and Tests ordered today are listed in the Patient Instructions below. There are no Patient Instructions on file for this visit.   Signed, Sanda Klein, MD  03/18/2017 1:53 PM    [facility] [place], [place], [place]  [address] Phone: [phone]; Fax: [phone]

## 2017-03-18 NOTE — Patient Instructions (Signed)
Dr Sallyanne Kuster recommends that you continue on your current medications as directed. Please refer to the Current Medication list given to you today.  Your physician has recommended that you have a CT of the head. This has been ordered to be completed at [place], [address].  You have been referred back to neurology.  Dr Sallyanne Kuster recommends that you schedule a follow-up appointment in 6-8 weeks.  If you need a refill on your cardiac medications before your next appointment, please call your pharmacy.

## 2017-03-21 ENCOUNTER — Other Ambulatory Visit: Payer: Self-pay | Admitting: Cardiology

## 2017-03-22 LAB — CUP PACEART INCLINIC DEVICE CHECK
Date Time Interrogation Session: 20190211142247
Implantable Lead Implant Date: 20131212
Implantable Lead Implant Date: 20131212
Implantable Lead Location: 753859
Implantable Lead Location: 753860
Implantable Lead Model: 5076
Implantable Lead Model: 5076
Implantable Pulse Generator Implant Date: 20131212
Lead Channel Setting Pacing Amplitude: 2 V
Lead Channel Setting Pacing Amplitude: 2.5 V
Lead Channel Setting Pacing Pulse Width: 0.4 ms
Lead Channel Setting Sensing Sensitivity: 5.6 mV

## 2017-03-22 NOTE — Telephone Encounter (Signed)
Dr. Dani Gobble Croitoru patient

## 2017-03-23 ENCOUNTER — Ambulatory Visit
Admission: RE | Admit: 2017-03-23 | Discharge: 2017-03-23 | Disposition: A | Discharge status: Home / Self Care | Payer: Medicare HMO | Source: Ambulatory Visit | Attending: Cardiovascular Disease | Admitting: Cardiovascular Disease

## 2017-03-23 DIAGNOSIS — R29898 Other symptoms and signs involving the musculoskeletal system: Secondary | ICD-10-CM

## 2017-03-23 DIAGNOSIS — R471 Dysarthria and anarthria: Secondary | ICD-10-CM

## 2017-03-23 DIAGNOSIS — R42 Dizziness and giddiness: Secondary | ICD-10-CM | POA: Diagnosis not present

## 2017-03-23 DIAGNOSIS — R296 Repeated falls: Secondary | ICD-10-CM

## 2017-03-24 ENCOUNTER — Ambulatory Visit: Payer: Medicare HMO | Admitting: Emergency Medicine

## 2017-03-24 DIAGNOSIS — E038 Other specified hypothyroidism: Secondary | ICD-10-CM | POA: Diagnosis not present

## 2017-03-24 DIAGNOSIS — E213 Hyperparathyroidism, unspecified: Secondary | ICD-10-CM | POA: Diagnosis not present

## 2017-03-24 DIAGNOSIS — M8580 Other specified disorders of bone density and structure, unspecified site: Secondary | ICD-10-CM | POA: Diagnosis not present

## 2017-03-24 DIAGNOSIS — E559 Vitamin D deficiency, unspecified: Secondary | ICD-10-CM | POA: Diagnosis not present

## 2017-03-24 DIAGNOSIS — I10 Essential (primary) hypertension: Secondary | ICD-10-CM | POA: Diagnosis not present

## 2017-03-25 ENCOUNTER — Ambulatory Visit (INDEPENDENT_AMBULATORY_CARE_PROVIDER_SITE_OTHER): Payer: Medicare HMO | Admitting: Family Medicine

## 2017-03-25 ENCOUNTER — Encounter: Payer: Self-pay | Admitting: Neurology

## 2017-03-25 ENCOUNTER — Ambulatory Visit: Payer: Medicare HMO | Admitting: Neurology

## 2017-03-25 ENCOUNTER — Encounter: Payer: Self-pay | Admitting: Family Medicine

## 2017-03-25 ENCOUNTER — Other Ambulatory Visit (HOSPITAL_COMMUNITY): Payer: Self-pay | Admitting: Endocrinology

## 2017-03-25 ENCOUNTER — Other Ambulatory Visit: Payer: Self-pay

## 2017-03-25 VITALS — BP 122/72 | HR 87 | Temp 98.0°F | Ht 61.02 in | Wt 151.0 lb

## 2017-03-25 DIAGNOSIS — R269 Unspecified abnormalities of gait and mobility: Secondary | ICD-10-CM | POA: Diagnosis not present

## 2017-03-25 DIAGNOSIS — I679 Cerebrovascular disease, unspecified: Secondary | ICD-10-CM | POA: Insufficient documentation

## 2017-03-25 DIAGNOSIS — E213 Hyperparathyroidism, unspecified: Secondary | ICD-10-CM

## 2017-03-25 DIAGNOSIS — Z1231 Encounter for screening mammogram for malignant neoplasm of breast: Secondary | ICD-10-CM

## 2017-03-25 DIAGNOSIS — N6320 Unspecified lump in the left breast, unspecified quadrant: Secondary | ICD-10-CM | POA: Diagnosis not present

## 2017-03-25 DIAGNOSIS — Z9882 Breast implant status: Secondary | ICD-10-CM | POA: Diagnosis not present

## 2017-03-25 DIAGNOSIS — I639 Cerebral infarction, unspecified: Secondary | ICD-10-CM | POA: Diagnosis not present

## 2017-03-25 HISTORY — DX: Unspecified abnormalities of gait and mobility: R26.9

## 2017-03-25 NOTE — Progress Notes (Signed)
Thank you Porcha Deblanc 

## 2017-03-25 NOTE — Progress Notes (Signed)
Reason for visit: Stroke  Referring physician: Dr. [name] is a 77 y.o. female  History of present illness:  [name] [name] is a 77 year old right-handed white female with an extensive history of heart disease.  [name] [name] has had coronary artery disease, a bypass procedure has been done, she has had left atrial appendage clipping procedure, she has had an ablation procedure for atrial fibrillation.  She has been on anticoagulation therapy until just recently within [name] last week.  [name] [name] was taken off of Eliquis because of episodes of falling, [name] [name] fell 3 weeks ago and fractured some ribs.  [name] [name] remains on Plavix.  [name] [name] has a pacemaker in place for symptomatic bradycardia.  Around 17 March 2017 [name] [name] had onset of left arm weakness, she was dropping things from her arm, and then she noted onset of an aphasia event, she had difficulty getting words out, with word finding problems and nonsensical speech.  [name] symptoms of [name] arm weakness and speech changes lasted about 1 hour and then cleared up for [name] most part, but [name] [name] felt somewhat foggy headed for several hours afterwards.  She believes that she is back to her baseline now, but she still appears to have some slight dysarthria and slowness of speech.  [name] [name] indicates that her balance issues appear to be episodic, most of [name] time she has good balance but she will have days where she cannot function well.  [name] [name] underwent a CT scan of [name] head yesterday, this appears to show fairly extensive white matter changes bilaterally, no definite acute changes were seen.  [name] [name] had been in physical therapy for her balance, but she had to stop because of her recent fall, she plans on getting back into physical therapy soon.  [name] [name] denies any significant problems with headaches, vision changes, she denies any current numbness or weakness of [name] extremities.  She denies issues  controlling [name] bowels or [name] bladder.  [name] [name] does note some mild cognitive changes that have occurred over [name] last several years.  She is sent to this office for an evaluation.  Past Medical History:  Diagnosis Date  . ANXIETY 12/31/2009  . Aortic insufficiency    a. mod by echo 2017.  Marland Kitchen CAD (coronary artery disease)    a. s/p CABGx3 and LAA clipping in 12/2014, NSTEMI 05/2015 s/p DES to native RCA and SVG-dRCA; occluded ramus-SVG was treated medically). c. neg nuc 10/2015 at [facility].  . Chronic diastolic CHF (congestive heart failure) ([place])   . Chronic fatigue   . CKD (chronic kidney disease), stage II   . DEPRESSION 12/31/2009  . [place] DISEASE, CERVICAL 12/31/2009  . [place] DISEASE, LUMBAR 12/31/2009  . DIVERTICULITIS, HX OF 12/31/2009  . Essential hypertension   . GLUCOSE INTOLERANCE 12/31/2009  . Habitual alcohol use   . Hypercalcemia   . Hyperkalemia   . Hyperlipidemia 12/31/2009  . MENOPAUSE, EARLY 12/31/2009  . NSTEMI (non-ST elevated myocardial infarction) ([place]) 05/11/2015  . Orthostatic hypotension   . OSTEOARTHRITIS, HAND   . Pacemaker 01/21/2012   MDT Adapta dual chamber  . Paroxysmal atrial flutter ([place])   . Persistent atrial fibrillation ([place])   . Pre-diabetes    [name] DENIES  . PVD (peripheral vascular disease), hx stents to bil SFAs 02/2010   . Symptomatic sinus bradycardia 01/22/2012  . Syncope    a. 11/2015 ? due to medications.  . Tachy-brady syndrome ([place])  a. s/p MDT PPM 2013.  . Tricuspid regurgitation     Past Surgical History:  Procedure Laterality Date  . ABDOMINAL AORTOGRAM W/LOWER EXTREMITY Bilateral 02/15/2017   Procedure: ABDOMINAL AORTOGRAM W/LOWER EXTREMITY;  Surgeon: Lorretta Harp, MD;  Location: [place] [facility];  Service: Cardiovascular;  Laterality: Bilateral;  bilat  . AUGMENTATION MAMMAPLASTY  1983  . CARDIAC CATHETERIZATION N/A 01/01/2015   Procedure: Left Heart Cath and Coronary Angiography;  Surgeon: Jettie Booze, MD;   Location: [place] [facility];  Service: Cardiovascular;  Laterality: N/A;  . CARDIAC CATHETERIZATION N/A 05/12/2015   Procedure: Left Heart Cath and Coronary Angiography;  Surgeon: Troy Sine, MD;  Location: [place] [facility];  Service: Cardiovascular;  Laterality: N/A;  . CARDIAC CATHETERIZATION N/A 05/12/2015   Procedure: Coronary Stent Intervention;  Surgeon: Troy Sine, MD;  Location: [place] [facility];  Service: Cardiovascular;  Laterality: N/A;  . CARDIOVERSION N/A 02/01/2015   Procedure: CARDIOVERSION;  Surgeon: Lelon Perla, MD;  Location: [facility] ENDOSCOPY;  Service: Cardiovascular;  Laterality: N/A;  . CARDIOVERSION N/A 08/30/2015   Procedure: CARDIOVERSION;  Surgeon: Sanda Klein, MD;  Location: [place] ENDOSCOPY;  Service: Cardiovascular;  Laterality: N/A;  . CARPAL TUNNEL RELEASE  2008   "right hand/thumb; carpal tunnel repair; got rid of arthritis" (01/21/2012)  . CLIPPING OF ATRIAL APPENDAGE N/A 01/04/2015   Procedure: CLIPPING OF ATRIAL APPENDAGE;  Surgeon: Grace Isaac, MD;  Location: [place];  Service: Open Heart Surgery;  Laterality: N/A;  . COLONOSCOPY N/A 12/21/2015   Procedure: COLONOSCOPY;  Surgeon: Milus Banister, MD;  Location: [facility] ENDOSCOPY;  Service: Endoscopy;  Laterality: N/A;  . CORONARY ARTERY BYPASS GRAFT N/A 01/04/2015   Procedure: CORONARY ARTERY BYPASS GRAFTING (CABG) x 3 using left internal mammory artery and greater saphenous vein right leg harvested endoscopically.;  Surgeon: Grace Isaac, MD;  LIMA-LAD, SVG-RI, SVG-PDA  . ESOPHAGOGASTRODUODENOSCOPY (EGD) WITH PROPOFOL N/A 12/18/2015   Procedure: ESOPHAGOGASTRODUODENOSCOPY (EGD) WITH PROPOFOL;  Surgeon: Milus Banister, MD;  Location: [facility] ENDOSCOPY;  Service: Endoscopy;  Laterality: N/A;  . FACELIFT, LOWER 2/3  1995   "mini" (01/21/2012)  . GIVENS CAPSULE STUDY N/A 12/21/2015   Procedure: GIVENS CAPSULE STUDY;  Surgeon: Milus Banister, MD;  Location: [facility] ENDOSCOPY;  Service: Endoscopy;  Laterality:  N/A;  . Lower Arterial Examination  10/28/2011   R. SFA stent mild-moderate mixed density plaque with elevated velocities consistent with 50% diameter reduction. L. SFA stent moderate mixed denisty plaque at mid to distal level consistent with 50-69% diameter reduction.  . LOWER EXTREMITY ANGIOGRAPHY N/A 02/15/2017   Procedure: LOWER EXTREMITY ANGIOGRAPHY;  Surgeon: Lorretta Harp, MD;  Location: [place] [facility];  Service: Cardiovascular;  Laterality: N/A;  . OOPHORECTOMY  ~1979  . PARTIAL COLECTOMY  2010  . PERIPHERAL ARTERIAL STENT GRAFT  2012; 2012   "LLE; RLE" (01/21/2012)  . PERIPHERAL VASCULAR BALLOON ANGIOPLASTY Right 02/15/2017   Procedure: PERIPHERAL VASCULAR BALLOON ANGIOPLASTY;  Surgeon: Lorretta Harp, MD;  Location: [place] [facility];  Service: Cardiovascular;  Laterality: Right;  SFA    . PERMANENT PACEMAKER INSERTION N/A 01/21/2012   Medtronic Adapta L implanted by Dr Sallyanne Kuster for tachy/brady syndrome  . POSTERIOR CERVICAL LAMINECTOMY  1985  . TEE WITHOUT CARDIOVERSION N/A 01/04/2015   Procedure: TRANSESOPHAGEAL ECHOCARDIOGRAM (TEE);  Surgeon: Grace Isaac, MD;  Location: [place];  Service: Open Heart Surgery;  Laterality: N/A;  . TEE WITHOUT CARDIOVERSION N/A 02/01/2015   Procedure: TRANSESOPHAGEAL ECHOCARDIOGRAM (  TEE);  Surgeon: Lelon Perla, MD;  Location: [facility] ENDOSCOPY;  Service: Cardiovascular;  Laterality: N/A;  . TEE WITHOUT CARDIOVERSION N/A 08/30/2015   Procedure: TRANSESOPHAGEAL ECHOCARDIOGRAM (TEE);  Surgeon: Sanda Klein, MD;  Location: [facility] ENDOSCOPY;  Service: Cardiovascular;  Laterality: N/A;  . VAGINAL HYSTERECTOMY  1975    Family History  Problem Relation Age of Onset  . Heart disease Mother   . Hypertension Mother   . Stroke Mother   . Stroke Father   . Heart disease Father   . Heart disease Brother   . Hypertension Brother        2 brothers  . CVA Brother        2 brothers  . Heart attack Brother        2 brothers    Social history:   reports that she has quit smoking. Her smoking use included cigarettes. She has a 7.50 pack-year smoking history. she has never used smokeless tobacco. She reports that she drinks alcohol. She reports that she does not use drugs.  Medications:  Prior to Admission medications   Medication Sig Start Date End Date Taking? Authorizing Provider  alendronate (FOSAMAX) 70 MG tablet Take 70 mg by mouth every Thursday.  03/24/16  Yes [provider]  carvedilol (COREG) 12.5 MG tablet Take 1 tablet (12.5 mg total) by mouth 2 (two) times daily with a meal. 02/25/17  Yes Croitoru, Mihai, MD  cholecalciferol (VITAMIN D) 1000 UNITS tablet Take 1,000 Units by mouth 2 (two) times a week.    Yes [provider]  citalopram (CELEXA) 20 MG tablet Take 0.5 tablets (10 mg total) by mouth daily. 12/09/15  Yes Theodis Blaze, MD  clopidogrel (PLAVIX) 75 MG tablet TAKE 1 TABLET EVERY DAY WITH BREAKFAST. 03/22/17  Yes Croitoru, Mihai, MD  Coenzyme Q10 (CO Q-10) 120 MG CAPS Take 120 mg by mouth at bedtime.   Yes [provider]  dofetilide (TIKOSYN) 250 MCG capsule Take 250 mcg by mouth 2 (two) times daily. (0900 & 2100)   Yes [provider]  levothyroxine (SYNTHROID, LEVOTHROID) 25 MCG tablet Take 25-50 mcg by mouth daily before breakfast. Take 1 tablet (25 mcg) by mouth Monday through Thursday, then take 2 tablets (50 mcg) on Friday, Saturday, & Sunday. 04/03/16  Yes [provider]  MAGNESIUM CITRATE PO Take 400 mg by mouth daily.   Yes [provider]  Multiple Vitamin (MULTIVITAMIN WITH MINERALS) TABS tablet Take 1 tablet by mouth 2 (two) times daily.   Yes [provider]  nitroGLYCERIN (NITROSTAT) 0.4 MG SL tablet Place 1 tablet (0.4 mg total) under [name] tongue every 5 (five) minutes x 3 doses as needed for chest pain. 05/15/15  Yes Simmons, Brittainy M, PA-C  Omega-3 Fatty Acids (SUPER OMEGA-3) 1000 MG CAPS Take 1,000 mg by mouth at bedtime.    Yes [provider]      Allergies  Allergen Reactions  . Brilinta [Ticagrelor] Shortness Of Breath  . Clonidine Derivatives Other (See Comments)    Lowers heart rate  . Atorvastatin Other (See Comments)    Possible cause of fatigue/malaise  . Crestor [Rosuvastatin Calcium] Other (See Comments)    Joint/muscle aches  . Exforge [Amlodipine Besylate-Valsartan] Itching and Rash  . Spironolactone Other (See Comments)    Contraindicated with history of hyperkalemia    ROS:  Out of a complete 14 system review of symptoms, [name] [name] complains only of [name] following symptoms, and all other reviewed systems are  negative.  Fatigue Joint pain Memory loss, confusion, weakness, dizziness, passing out Decreased energy, disinterest in activities  Blood pressure (!) 135/91, pulse 93, height 5\' 5"  (1.651 m), weight 151 lb (68.5 kg).  Physical Exam  General: [name] [name] is alert and cooperative at [name] time of [name] examination.  Eyes: Pupils are equal, round, and reactive to light. Discs are flat bilaterally.  Neck: [name] neck is supple, no carotid bruits are noted.  Respiratory: [name] respiratory examination is clear.  Cardiovascular: [name] cardiovascular examination reveals a regular rate and rhythm, no obvious murmurs or rubs are noted.  Skin: Extremities are without significant edema.  Neurologic Exam  Mental status: [name] [name] is alert and oriented x 3 at [name] time of [name] examination. [name] [name] has apparent normal recent and remote memory, with an apparently normal attention span and concentration ability.  Cranial nerves: Facial symmetry is present. There is good sensation of [name] face to pinprick and soft touch on [name] right, decreased on [name] left lower face. [name] strength of [name] facial muscles and [name] muscles to head turning and shoulder shrug are normal bilaterally. Speech is slightly dysarthric, slow and deliberate, not aphasic. Extraocular movements are full. Visual fields are full. [name]  tongue is midline, and [name] [name] has symmetric elevation of [name] soft palate. No obvious hearing deficits are noted.  Motor: [name] motor testing reveals 5 over 5 strength of all 4 extremities. Good symmetric motor tone is noted throughout.  Sensory: Sensory testing is intact to pinprick, soft touch, vibration sensation, and position sense on all 4 extremities. No evidence of extinction is noted.  Coordination: Cerebellar testing reveals good finger-nose-finger and heel-to-shin bilaterally.  Gait and station: Gait is normal. Tandem gait is normal. Romberg is negative. No drift is seen.  Reflexes: Deep tendon reflexes are symmetric and normal bilaterally. Toes are downgoing bilaterally.   CT head 03/24/17:  IMPRESSION: 1. No acute intracranial process. 2. Stable examination including moderate to severe chronic small vessel ischemic disease.  * CT scan images were reviewed online. I agree with [name] written report.     Assessment/Plan:  1.  Cerebrovascular disease, recent episode of left arm weakness and aphasia  2.  Heart disease, coronary artery disease, atrial fibrillation  [name] [name] gives a history of several episodes of left-sided arm weakness that have occurred over [name] last 12 months, she reports at least 6 such events.  [name] [name] had a recurrence of this episode on 17 March 2017, but this episode was also associated with aphasia.  [name] recurrence of TIA type events involving [name] left arm suggests a fixed blood vessel stenosis, not a cardiogenic source of her symptoms.  [name] [name] will be sent for CT angiogram of [name] head and neck.  Her balance issues are not significant currently, but [name] [name] does report episodes of gait instability.  [name] [name] is to get back into physical therapy as soon as possible.  [name] [name] will follow-up here in about 3 months.  Jill Alexanders MD 03/25/2017 8:05 AM  [facility] [address] [place], [place]  [address]  Phone [phone] Fax [phone]

## 2017-03-25 NOTE — Progress Notes (Signed)
2/14/201910:20 AM  [name] 07-Nov-1940, 77 y.o. female [id number]  Chief Complaint  Patient presents with  . Fall    HAVING UNEXPLAINED FALLS. FELL 3 WKS AGO, BROKEN RIBS 7 AND 8 . HAS NOTICED LUMPS IN THE LFT BREAST    HPI:   Patient is a 77 y.o. female who presents today for left breast lumps that she noticed about a month ago after her fall, they have not changed. She has implants, placed in 1980s. Denies any skin changes, nipple discharge, swollen lymph nodes. She reports normal mammogram about 2 years ago. She denies any fhx of breast or ovarian cancer.   Seen by cards - doing well, rate controlled after ablation and on current meds Seen by neuro - thinking TIA's from obstruction not embolic, CTA pending, no mri given pacemaker Involved in PT for gait and balance Seen by endo - undergoing workup for parathypothyroidism, manages dexa, on alendronate Slowly healing from rib fractures, left side, a month ago  Depression screen [facility] 2/9 03/25/2017 03/10/2017 02/04/2017  Decreased Interest 1 0 0  Down, Depressed, Hopeless 1 0 0  PHQ - 2 Score 2 0 0  Altered sleeping - - -  Tired, decreased energy - - -  Change in appetite - - -  Feeling bad or failure about yourself  - - -  Trouble concentrating - - -  Moving slowly or fidgety/restless - - -  Suicidal thoughts - - -  PHQ-9 Score - - -  Difficult doing work/chores - - -  Some recent data might be hidden    Allergies  Allergen Reactions  . Brilinta [Ticagrelor] Shortness Of Breath  . Clonidine Derivatives Other (See Comments)    Lowers heart rate  . Atorvastatin Other (See Comments)    Possible cause of fatigue/malaise  . Crestor [Rosuvastatin Calcium] Other (See Comments)    Joint/muscle aches  . Exforge [Amlodipine Besylate-Valsartan] Itching and Rash  . Spironolactone Other (See Comments)    Contraindicated with history of hyperkalemia    Prior to Admission medications   Medication Sig Start Date End Date  Taking? Authorizing Provider  alendronate (FOSAMAX) 70 MG tablet Take 70 mg by mouth every Thursday.  03/24/16   [provider]  carvedilol (COREG) 12.5 MG tablet Take 1 tablet (12.5 mg total) by mouth 2 (two) times daily with a meal. 02/25/17   Croitoru, Mihai, MD  cholecalciferol (VITAMIN D) 1000 UNITS tablet Take 1,000 Units by mouth 2 (two) times a week.     [provider]  citalopram (CELEXA) 20 MG tablet Take 0.5 tablets (10 mg total) by mouth daily. 12/09/15   Theodis Blaze, MD  clopidogrel (PLAVIX) 75 MG tablet TAKE 1 TABLET EVERY DAY WITH BREAKFAST. 03/22/17   Croitoru, Mihai, MD  Coenzyme Q10 (CO Q-10) 120 MG CAPS Take 120 mg by mouth at bedtime.    [provider]  dofetilide (TIKOSYN) 250 MCG capsule Take 250 mcg by mouth 2 (two) times daily. (0900 & 2100)    [provider]  levothyroxine (SYNTHROID, LEVOTHROID) 25 MCG tablet Take 25-50 mcg by mouth daily before breakfast. Take 1 tablet (25 mcg) by mouth Monday through Thursday, then take 2 tablets (50 mcg) on Friday, Saturday, & Sunday. 04/03/16   [provider]  MAGNESIUM CITRATE PO Take 400 mg by mouth daily.    [provider]  Multiple Vitamin (MULTIVITAMIN WITH MINERALS) TABS tablet Take 1 tablet by mouth 2 (two) times daily.  [provider]  nitroGLYCERIN (NITROSTAT) 0.4 MG SL tablet Place 1 tablet (0.4 mg total) under the tongue every 5 (five) minutes x 3 doses as needed for chest pain. 05/15/15   Lyda Jester M, PA-C  Omega-3 Fatty Acids (SUPER OMEGA-3) 1000 MG CAPS Take 1,000 mg by mouth at bedtime.     [provider]    Past Medical History:  Diagnosis Date  . ANXIETY 12/31/2009  . Aortic insufficiency    a. mod by echo 2017.  Marland Kitchen CAD (coronary artery disease)    a. s/p CABGx3 and LAA clipping in 12/2014, NSTEMI 05/2015 s/p DES to native RCA and SVG-dRCA; occluded ramus-SVG was treated medically). c. neg nuc 10/2015 at [facility].  . Chronic diastolic  CHF (congestive heart failure) ([place])   . Chronic fatigue   . CKD (chronic kidney disease), stage II   . DEPRESSION 12/31/2009  . [place] DISEASE, CERVICAL 12/31/2009  . [place] DISEASE, LUMBAR 12/31/2009  . DIVERTICULITIS, HX OF 12/31/2009  . Essential hypertension   . Gait abnormality 03/25/2017  . GLUCOSE INTOLERANCE 12/31/2009  . Habitual alcohol use   . Hypercalcemia   . Hyperkalemia   . Hyperlipidemia 12/31/2009  . MENOPAUSE, EARLY 12/31/2009  . NSTEMI (non-ST elevated myocardial infarction) ([place]) 05/11/2015  . Orthostatic hypotension   . OSTEOARTHRITIS, HAND   . Pacemaker 01/21/2012   MDT Adapta dual chamber  . Paroxysmal atrial flutter ([place])   . Persistent atrial fibrillation ([place])   . Pre-diabetes    PATIENT DENIES  . PVD (peripheral vascular disease), hx stents to bil SFAs 02/2010   . Symptomatic sinus bradycardia 01/22/2012  . Syncope    a. 11/2015 ? due to medications.  . Tachy-brady syndrome ([place])    a. s/p MDT PPM 2013.  . Tricuspid regurgitation     Past Surgical History:  Procedure Laterality Date  . ABDOMINAL AORTOGRAM W/LOWER EXTREMITY Bilateral 02/15/2017   Procedure: ABDOMINAL AORTOGRAM W/LOWER EXTREMITY;  Surgeon: Lorretta Harp, MD;  Location: [place] CV LAB;  Service: Cardiovascular;  Laterality: Bilateral;  bilat  . AUGMENTATION MAMMAPLASTY  1983  . CARDIAC CATHETERIZATION N/A 01/01/2015   Procedure: Left Heart Cath and Coronary Angiography;  Surgeon: Jettie Booze, MD;  Location: [place] CV LAB;  Service: Cardiovascular;  Laterality: N/A;  . CARDIAC CATHETERIZATION N/A 05/12/2015   Procedure: Left Heart Cath and Coronary Angiography;  Surgeon: Troy Sine, MD;  Location: [place] [facility] LAB;  Service: Cardiovascular;  Laterality: N/A;  . CARDIAC CATHETERIZATION N/A 05/12/2015   Procedure: Coronary Stent Intervention;  Surgeon: Troy Sine, MD;  Location: [place] CV LAB;  Service: Cardiovascular;  Laterality: N/A;  . CARDIOVERSION N/A  02/01/2015   Procedure: CARDIOVERSION;  Surgeon: Lelon Perla, MD;  Location: [facility] ENDOSCOPY;  Service: Cardiovascular;  Laterality: N/A;  . CARDIOVERSION N/A 08/30/2015   Procedure: CARDIOVERSION;  Surgeon: Sanda Klein, MD;  Location: [place] ENDOSCOPY;  Service: Cardiovascular;  Laterality: N/A;  . CARPAL TUNNEL RELEASE  2008   "right hand/thumb; carpal tunnel repair; got rid of arthritis" (01/21/2012)  . CLIPPING OF ATRIAL APPENDAGE N/A 01/04/2015   Procedure: CLIPPING OF ATRIAL APPENDAGE;  Surgeon: Grace Isaac, MD;  Location: [place];  Service: Open Heart Surgery;  Laterality: N/A;  . COLONOSCOPY N/A 12/21/2015   Procedure: COLONOSCOPY;  Surgeon: Milus Banister, MD;  Location: [facility] ENDOSCOPY;  Service: Endoscopy;  Laterality: N/A;  . CORONARY ARTERY BYPASS GRAFT N/A 01/04/2015   Procedure: CORONARY ARTERY BYPASS GRAFTING (CABG) x 3  using left internal mammory artery and greater saphenous vein right leg harvested endoscopically.;  Surgeon: Grace Isaac, MD;  LIMA-LAD, SVG-RI, SVG-PDA  . ESOPHAGOGASTRODUODENOSCOPY (EGD) WITH PROPOFOL N/A 12/18/2015   Procedure: ESOPHAGOGASTRODUODENOSCOPY (EGD) WITH PROPOFOL;  Surgeon: Milus Banister, MD;  Location: [facility] ENDOSCOPY;  Service: Endoscopy;  Laterality: N/A;  . FACELIFT, LOWER 2/3  1995   "mini" (01/21/2012)  . GIVENS CAPSULE STUDY N/A 12/21/2015   Procedure: GIVENS CAPSULE STUDY;  Surgeon: Milus Banister, MD;  Location: [facility] ENDOSCOPY;  Service: Endoscopy;  Laterality: N/A;  . Lower Arterial Examination  10/28/2011   R. SFA stent mild-moderate mixed density plaque with elevated velocities consistent with 50% diameter reduction. L. SFA stent moderate mixed denisty plaque at mid to distal level consistent with 50-69% diameter reduction.  . LOWER EXTREMITY ANGIOGRAPHY N/A 02/15/2017   Procedure: LOWER EXTREMITY ANGIOGRAPHY;  Surgeon: Lorretta Harp, MD;  Location: [place] CV LAB;  Service: Cardiovascular;  Laterality: N/A;  . OOPHORECTOMY   ~1979  . PARTIAL COLECTOMY  2010  . PERIPHERAL ARTERIAL STENT GRAFT  2012; 2012   "LLE; RLE" (01/21/2012)  . PERIPHERAL VASCULAR BALLOON ANGIOPLASTY Right 02/15/2017   Procedure: PERIPHERAL VASCULAR BALLOON ANGIOPLASTY;  Surgeon: Lorretta Harp, MD;  Location: [place] CV LAB;  Service: Cardiovascular;  Laterality: Right;  SFA    . PERMANENT PACEMAKER INSERTION N/A 01/21/2012   Medtronic Adapta L implanted by Dr Sallyanne Kuster for tachy/brady syndrome  . POSTERIOR CERVICAL LAMINECTOMY  1985  . TEE WITHOUT CARDIOVERSION N/A 01/04/2015   Procedure: TRANSESOPHAGEAL ECHOCARDIOGRAM (TEE);  Surgeon: Grace Isaac, MD;  Location: [place];  Service: Open Heart Surgery;  Laterality: N/A;  . TEE WITHOUT CARDIOVERSION N/A 02/01/2015   Procedure: TRANSESOPHAGEAL ECHOCARDIOGRAM (TEE);  Surgeon: Lelon Perla, MD;  Location: [facility] ENDOSCOPY;  Service: Cardiovascular;  Laterality: N/A;  . TEE WITHOUT CARDIOVERSION N/A 08/30/2015   Procedure: TRANSESOPHAGEAL ECHOCARDIOGRAM (TEE);  Surgeon: Sanda Klein, MD;  Location: [facility] ENDOSCOPY;  Service: Cardiovascular;  Laterality: N/A;  . VAGINAL HYSTERECTOMY  1975    Social History   Tobacco Use  . Smoking status: Former Smoker    Packs/day: 0.75    Years: 10.00    Pack years: 7.50    Types: Cigarettes  . Smokeless tobacco: Never Used  . Tobacco comment: 01/21/2012 "quit smoking ~ 2002"  Substance Use Topics  . Alcohol use: Yes    Comment: admits to drinking 3+ drinks last night, and possibly daily    Family History  Problem Relation Age of Onset  . Heart disease Mother   . Hypertension Mother   . Stroke Mother   . Stroke Father   . Heart disease Father   . Heart disease Brother   . Hypertension Brother        2 brothers  . CVA Brother        2 brothers  . Heart attack Brother        2 brothers    Review of Systems  Constitutional: Negative for chills and fever.  Respiratory: Negative for cough and shortness of breath.   Cardiovascular:  Positive for chest pain (broken ribs, getting better). Negative for palpitations and leg swelling.  Gastrointestinal: Negative for abdominal pain, nausea and vomiting.    OBJECTIVE:  Blood pressure 122/72, pulse 87, temperature 98 F (36.7 C), temperature source Oral, height 5' 1.02" (1.55 m), weight 151 lb (68.5 kg), SpO2 95 %.  Physical Exam  Constitutional: She is oriented to person, place, and time  and well-developed, well-nourished, and in no distress.  HENT:  Head: Normocephalic and atraumatic.  Mouth/Throat: Oropharynx is clear and moist. No oropharyngeal exudate.  Eyes: EOM are normal. Pupils are equal, round, and reactive to light. No scleral icterus.  Neck: Neck supple.  Cardiovascular: Normal rate, regular rhythm and normal heart sounds. Exam reveals no gallop and no friction rub.  No murmur heard. Pulmonary/Chest: Effort normal and breath sounds normal. She has no wheezes. She has no rales. She exhibits tenderness. She exhibits no crepitus and no edema. Right breast exhibits no inverted nipple, no mass, no nipple discharge, no skin change and no tenderness. Left breast exhibits mass (Firm oblong mobile tender at 3 oclock, firm nonmobile nontender nodule at 7 oclock). Left breast exhibits no inverted nipple, no nipple discharge, no skin change and no tenderness.  Musculoskeletal: She exhibits no edema.  Neurological: She is alert and oriented to person, place, and time. Gait normal.  Skin: Skin is warm and dry.     ASSESSMENT and PLAN  1. Left breast mass Difficult to fully assess given implants, might be related to recent trauma, will start with mammograms, discussed ice/heat, reassess at next visit, consider referral to breast center - MM DIAG BREAST W/IMPLANT TOMO BILATERAL; Future  2. H/O bilateral breast implants - MM DIAG BREAST W/IMPLANT TOMO BILATERAL; Future  3. Visit for screening mammogram - MM Digital Screening Unilat R; Future  Return in about 4 weeks  (around 04/22/2017).    Rutherford Guys, MD Primary Care at [place], [place] [address] Ph.  [phone] Fax [phone]

## 2017-03-25 NOTE — Patient Instructions (Signed)
     IF you received an x-ray today, you will receive an invoice from [facility]. Please contact [facility] at [phone] with questions or concerns regarding your invoice.   IF you received labwork today, you will receive an invoice from [facility]. Please contact [facility] at [phone] with questions or concerns regarding your invoice.   Our billing staff will not be able to assist you with questions regarding bills from these companies.  You will be contacted with the lab results as soon as they are available. The fastest way to get your results is to activate your My Chart account. Instructions are located on the last page of this paperwork. If you have not heard from us regarding the results in 2 weeks, please contact this office.     

## 2017-03-25 NOTE — Patient Instructions (Signed)
We will get CT angiogram of the head and the neck. Get into physical therapy to work on the balance.

## 2017-03-30 LAB — CUP PACEART INCLINIC DEVICE CHECK
Date Time Interrogation Session: 20190219145611
Implantable Lead Implant Date: 20131212
Implantable Lead Implant Date: 20131212
Implantable Lead Location: 753859
Implantable Lead Location: 753860
Implantable Lead Model: 5076
Implantable Lead Model: 5076
Implantable Pulse Generator Implant Date: 20131212
Lead Channel Setting Pacing Amplitude: 2 V
Lead Channel Setting Pacing Amplitude: 2.5 V
Lead Channel Setting Pacing Pulse Width: 0.4 ms
Lead Channel Setting Sensing Sensitivity: 5.6 mV

## 2017-04-01 ENCOUNTER — Other Ambulatory Visit: Payer: Self-pay | Admitting: Family Medicine

## 2017-04-01 ENCOUNTER — Encounter (HOSPITAL_COMMUNITY): Admission: RE | Admit: 2017-04-01 | Payer: Medicare HMO | Source: Ambulatory Visit

## 2017-04-01 ENCOUNTER — Encounter (HOSPITAL_COMMUNITY): Payer: Medicare HMO | Attending: Endocrinology

## 2017-04-01 DIAGNOSIS — Z9882 Breast implant status: Secondary | ICD-10-CM

## 2017-04-01 DIAGNOSIS — N6320 Unspecified lump in the left breast, unspecified quadrant: Secondary | ICD-10-CM

## 2017-04-05 ENCOUNTER — Telehealth: Payer: Self-pay | Admitting: Family Medicine

## 2017-04-05 ENCOUNTER — Encounter: Payer: Self-pay | Admitting: Family Medicine

## 2017-04-05 NOTE — Telephone Encounter (Signed)
Copied from [place]. Topic: Referral - Status >> Mar 29, 2017  9:16 AM Conception Chancy, NT wrote: Patient states that someone is supposed to schedule her a mammogram and she has not received a call yet. She would like a call back on the status of getting that scheduled.  >> Apr 01, 2017  9:59 AM Cleaster Corin, NT wrote: Pt. Calling back to see where her referral was sent to she states that she has called [facility] and they do not have any records of her needing to come in for an appt. Pt. Can be reached at [phone]  >> Apr 05, 2017  4:12 PM Lolita Rieger, [place] wrote: Pt called back and stated that she still has not heard form anyone concerning her breast US and she would like a call back

## 2017-04-05 NOTE — Telephone Encounter (Signed)
Pt. Came in person to inform the practice that her appt with the breast cancer center  On 04/06/17 has been canceled due to an unspecified issue with the way the referral was coded on the doctors end. The pt alleges that she was told the appt was canceled due to the authorization not being signed by the doctor but is unsure exactly what that means. She is aware that the Dr.'s order is in and is unsure of the exact nature of the problem.  Neither Peter Congo nor Deneise Lever were in the office during the time of the encounter.  Please call the pt when a solution has been found at  [phone]

## 2017-04-06 ENCOUNTER — Ambulatory Visit: Payer: Medicare HMO

## 2017-04-06 ENCOUNTER — Ambulatory Visit
Admission: RE | Admit: 2017-04-06 | Discharge: 2017-04-06 | Disposition: A | Discharge status: Home / Self Care | Payer: Medicare HMO | Source: Ambulatory Visit | Attending: Family Medicine | Admitting: Family Medicine

## 2017-04-06 ENCOUNTER — Other Ambulatory Visit: Payer: Medicare HMO

## 2017-04-06 DIAGNOSIS — N6320 Unspecified lump in the left breast, unspecified quadrant: Secondary | ICD-10-CM

## 2017-04-06 DIAGNOSIS — Z9882 Breast implant status: Secondary | ICD-10-CM

## 2017-04-06 DIAGNOSIS — R928 Other abnormal and inconclusive findings on diagnostic imaging of breast: Secondary | ICD-10-CM | POA: Diagnosis not present

## 2017-04-06 NOTE — Telephone Encounter (Signed)
Spoke with Fanny Skates at [facility]. She stated appts were canceled due to no co-sign, however, there is now co-signature on all orders. She is going to contact the pt to see when she can come in.

## 2017-04-06 NOTE — Telephone Encounter (Signed)
noted 

## 2017-04-06 NOTE — Telephone Encounter (Signed)
Call to pt, who is aware of existing appt for mammogram.

## 2017-04-07 DIAGNOSIS — E210 Primary hyperparathyroidism: Secondary | ICD-10-CM | POA: Diagnosis not present

## 2017-04-14 ENCOUNTER — Other Ambulatory Visit: Payer: Self-pay | Admitting: Cardiovascular Disease

## 2017-04-14 MED ORDER — CLOPIDOGREL BISULFATE 75 MG PO TABS: ORAL_TABLET | ORAL | 6 refills | Status: DC

## 2017-04-14 NOTE — Telephone Encounter (Signed)
°*  STAT* If patient is at the pharmacy, call can be transferred to refill team.   1. Which medications need to be refilled? (please list name of each medication and dose if known) new prescription for Clopidogrel  2. Which pharmacy/location (including street and city if local pharmacy) is medication to be sent to?-[facility]  3. Do they need a 30 day or 90 day supply? 90 and refills

## 2017-04-15 ENCOUNTER — Encounter (HOSPITAL_COMMUNITY)
Admission: RE | Admit: 2017-04-15 | Discharge: 2017-04-15 | Disposition: A | Discharge status: Home / Self Care | Payer: Medicare HMO | Source: Ambulatory Visit | Attending: Endocrinology | Admitting: Endocrinology

## 2017-04-15 DIAGNOSIS — E213 Hyperparathyroidism, unspecified: Secondary | ICD-10-CM | POA: Diagnosis not present

## 2017-04-15 MED ORDER — TECHNETIUM TC 99M SESTAMIBI GENERIC - CARDIOLITE
25.0000 | Freq: Once | INTRAVENOUS | Status: AC | PRN
  Administered 2017-04-15: 25 via INTRAVENOUS

## 2017-04-16 ENCOUNTER — Telehealth: Payer: Self-pay | Admitting: Cardiovascular Disease

## 2017-04-16 NOTE — Telephone Encounter (Signed)
Left message to call back  

## 2017-04-16 NOTE — Telephone Encounter (Signed)
New Message    Pt c/o medication issue:  1. Name of Medication:   clopidogrel (PLAVIX) 75 MG tablet    2. How are you currently taking this medication (dosage and times per day)? 75mg  1 tablet daily   3. Are you having a reaction (difficulty breathing--STAT)? Itching and headache  4. What is your medication issue? Patient states that she has developed itchy and headaches since starting this medication. Please call to discuss.

## 2017-04-19 ENCOUNTER — Ambulatory Visit
Admission: RE | Admit: 2017-04-19 | Discharge: 2017-04-19 | Disposition: A | Discharge status: Home / Self Care | Payer: Medicare HMO | Source: Ambulatory Visit | Attending: Neurology | Admitting: Neurology

## 2017-04-19 ENCOUNTER — Telehealth: Payer: Self-pay | Admitting: Neurology

## 2017-04-19 DIAGNOSIS — I639 Cerebral infarction, unspecified: Secondary | ICD-10-CM

## 2017-04-19 DIAGNOSIS — R269 Unspecified abnormalities of gait and mobility: Secondary | ICD-10-CM

## 2017-04-19 DIAGNOSIS — R531 Weakness: Secondary | ICD-10-CM

## 2017-04-19 DIAGNOSIS — I63233 Cerebral infarction due to unspecified occlusion or stenosis of bilateral carotid arteries: Secondary | ICD-10-CM | POA: Diagnosis not present

## 2017-04-19 MED ORDER — IOPAMIDOL (ISOVUE-370) INJECTION 76%
75.0000 mL | Freq: Once | INTRAVENOUS | Status: AC | PRN
  Administered 2017-04-19: 75 mL via INTRAVENOUS

## 2017-04-19 NOTE — Telephone Encounter (Signed)
I called the patient.  The CT angiogram of the head and neck does not show any critical stenosis that would explain recurring TIA type events.  The patient does have extensive small vessel disease, she will remain on Plavix and she will check her blood pressures frequently.  I will get her set up for an EEG study to exclude the possibility of seizures.   CTA head and neck 04/19/17:  IMPRESSION: Aortic atherosclerosis. Ectasia of the ascending aorta with maximal diameter of 3.8 cm.  Atherosclerotic disease at the left common carotid artery origin with stenosis of 25%. Atherosclerotic disease at both carotid bifurcation and proximal ICA regions. Minimal ICA diameter of 3 mm on each side, consistent with 33% stenosis by NASCET criteria.  30% stenosis of the dominant right vertebral artery origin but wide patency beyond that. Serial 30-50% stenoses of the proximal 2 cm of the non dominant left vertebral artery. Narrowing and irregularity of the V3 segment just beneath the skull base as well. This small left vertebral vessel supplies primarily left PICA, with the majority of the basilar supply coming from the dominant right vertebral.  No intracranial stenosis or occlusion otherwise. Ordinary atherosclerotic calcification in the carotid siphon regions without stenosis greater than 30% suspected.

## 2017-04-20 DIAGNOSIS — M6281 Muscle weakness (generalized): Secondary | ICD-10-CM | POA: Diagnosis not present

## 2017-04-20 DIAGNOSIS — G894 Chronic pain syndrome: Secondary | ICD-10-CM | POA: Diagnosis not present

## 2017-04-20 NOTE — Telephone Encounter (Signed)
Patient called back to discuss Plavix. She states she was on Brilinta and then changed to Plavix (05/2015). She states she was then prescribed Eliquis at some point in time (is now off anti-coag d/t falls). She states she remembers a medication making her itch and break out in the past and now she is having itching and is breaking out on her legs and arms. She was put back on Plavix in Jan by Dr. Gwenlyn Found after PV procedure (PV note: The patient will be treated with low-dose aspirin and Plavix for one month). Dr. Jannifer Franklin' phone note from 04/19/17 referring to CT angio head/neck states "The patient does have extensive small vessel disease, she will remain on Plavix and she will check her blood pressures frequently." She states she can tolerate the itching. She needs to know what Dr. Lurline Del recommendations are - should she continue Plavix or discontinue?

## 2017-04-20 NOTE — Telephone Encounter (Signed)
Patient has been on Plavix since 05/2015 - not a new medication.   LMTCB

## 2017-04-21 NOTE — Telephone Encounter (Signed)
lmtcb

## 2017-04-21 NOTE — Telephone Encounter (Signed)
Please tell her I discussed with Dr. Gwenlyn Found as well. If she can tolerate the itching for a total of 3 months after the PV procedure, then go to ASA 81 mg daily only, that would be optimal.  If the rash/itching becomes intolerable, please stop the Plavix and let us know. [name]

## 2017-04-26 ENCOUNTER — Ambulatory Visit: Payer: Self-pay | Admitting: Surgery

## 2017-04-27 ENCOUNTER — Ambulatory Visit: Payer: Medicare HMO | Admitting: Cardiovascular Disease

## 2017-04-27 ENCOUNTER — Encounter: Payer: Self-pay | Admitting: Cardiovascular Disease

## 2017-04-27 VITALS — BP 130/80 | HR 89 | Ht 65.0 in | Wt 153.2 lb

## 2017-04-27 DIAGNOSIS — G894 Chronic pain syndrome: Secondary | ICD-10-CM | POA: Diagnosis not present

## 2017-04-27 DIAGNOSIS — I10 Essential (primary) hypertension: Secondary | ICD-10-CM | POA: Diagnosis not present

## 2017-04-27 DIAGNOSIS — I25810 Atherosclerosis of coronary artery bypass graft(s) without angina pectoris: Secondary | ICD-10-CM | POA: Diagnosis not present

## 2017-04-27 DIAGNOSIS — Z79899 Other long term (current) drug therapy: Secondary | ICD-10-CM | POA: Diagnosis not present

## 2017-04-27 DIAGNOSIS — I480 Paroxysmal atrial fibrillation: Secondary | ICD-10-CM | POA: Diagnosis not present

## 2017-04-27 DIAGNOSIS — I739 Peripheral vascular disease, unspecified: Secondary | ICD-10-CM | POA: Diagnosis not present

## 2017-04-27 DIAGNOSIS — Z5181 Encounter for therapeutic drug level monitoring: Secondary | ICD-10-CM | POA: Diagnosis not present

## 2017-04-27 DIAGNOSIS — M6281 Muscle weakness (generalized): Secondary | ICD-10-CM | POA: Diagnosis not present

## 2017-04-27 DIAGNOSIS — I679 Cerebrovascular disease, unspecified: Secondary | ICD-10-CM | POA: Diagnosis not present

## 2017-04-27 DIAGNOSIS — E210 Primary hyperparathyroidism: Secondary | ICD-10-CM

## 2017-04-27 DIAGNOSIS — I5032 Chronic diastolic (congestive) heart failure: Secondary | ICD-10-CM | POA: Diagnosis not present

## 2017-04-27 DIAGNOSIS — Z950 Presence of cardiac pacemaker: Secondary | ICD-10-CM | POA: Diagnosis not present

## 2017-04-27 NOTE — Patient Instructions (Signed)
Medication Instructions:  Your physician recommends that you continue on your current medications as directed. Please refer to the Current Medication list given to you today.  Labwork: NONE  Testing/Procedures: NONE  Follow-Up: Your physician recommends that you schedule a follow-up appointment in: 6 MONTH OV   Any Other Special Instructions Will Be Listed Below (If Applicable). OK TO HOLD YOUR PLAVIX STARTING ON 05/08/17 RESUME WHEN OK WITH SURGEON    If you need a refill on your cardiac medications before your next appointment, please call your pharmacy.

## 2017-04-27 NOTE — Progress Notes (Signed)
Patient ID: [name], female   DOB: [birth year], 77 y.o.   MRN: [id number]     Cardiology Office Note    Date:  04/27/2017   ID:  [name], DOB 1940/05/03, MRN [id number]  PCP:  Rutherford Guys, MD  Cardiologist:   Sanda Klein, MD   No chief complaint on file.   History of Present Illness:  [name] is a 77 y.o. female with coronary artery disease s/p bypass surgery (November 2016) and placement of drug-eluting stents (DES to distal SVG-RCA, April 2017 ) and left atrial appendage clipping, symptomatic paroxysmal atrial fibrillation and atrial flutter with recent radiofrequency ablation (caval tricuspid isthmus and pulmonary vein isolation), PAD with previous stents, history of GI bleeding while on anticoagulation, volatile hypertension.  After an episode of left hand weakness and difficulty speaking she was evaluated by Dr. Jannifer Franklin.  Her workup so far has been thankfully relatively benign.  CT angiogram showed widespread atherosclerosis, but only minor stenoses in the carotid system and the intracranial vessels.  She did have extensive small vessel disease and the plan is for her to remain on clopidogrel.  She still has an EEG scheduled for April 15.  Anticoagulation with Eliquis was stopped due to repeated falls.  About 2 months ago she fell and fractured several ribs.  She is on clopidogrel for peripheral arterial disease with a relatively recent stent procedure as well as coronary artery disease.  She believes the clopidogrel is responsible for a mild rash, which she recalls having when she took this medication in the past.  She prefers clopidogrel to Brilinta which caused a sensation of persistent breathlessness.  Denies intermittent claudication, angina or dyspnea with activity.  She has not had leg edema.  She denies any recurrent neurological complaints since her last appointment.  She generally feels well.  She is scheduled for parathyroidectomy on April 4 with Dr. Harlow Asa.   The plan is for her to stop the clopidogrel at least 5 days before that procedure.  She has had a durable and remarkably beneficial effect from her atrial fibrillation ablation procedure last year with a marked reduction in the burden of atrial fibrillation.  She is still on dofetilide and her QT interval was in normal range at her recent check.  On her last pacemaker check she only had 9 episodes of mode switch, all of them less than a minute in duration.  Her dual chamber pacemaker is programmed DDDR with a lower rate limit of 75 bpm and she has 97% atrial pacing and only 0.4% ventricular pacing.  Lead parameters are excellent.  The device was implanted in 2013.  Battery status is good still roughly 7-8 years of longevity.,   Amiodarone caused hypothyroidism and alopecia and she has not taken that medication since April. She has tried taking statins but has repeatedly stopped them due to musculoskeletal problems. Most recently she was intolerant to atorvastatin and rosuvastatin. She is currently not taking any lipid-lowering agents.  She has normal left ventricular systolic function. She does have evidence of grade 2 diastolic dysfunction on previous echo. At the time of her bypass surgery she received an Atricure left atrial appendage clip. She is also on clopidogrel following her drug-eluting stents in April 2017. The pacemaker was implanted in 2013 for symptomatic sinus bradycardia. She also has a history of peripheral arterial disease and received bilateral superficial femoral artery stents about 3 years ago. She has never smoked but drinks daily. She has treated hypertension.  After undergoing bypass surgery in November 2016 she returned with non-ST segment elevation myocardial infarction in April 2017 and required placement of stents both in the native right coronary artery (3.020 mm Synergy DES) and in the saphenous vein graft to the distal right coronary artery (2.7538 mm Synergy DES) due to  early graft dysfunction. The saphenous vein graft to the ramus intermedius was also occluded, but this vessel was left for medical therapy.    Past Medical History:  Diagnosis Date  . ANXIETY 12/31/2009  . Aortic insufficiency    a. mod by echo 2017.  Marland Kitchen CAD (coronary artery disease)    a. s/p CABGx3 and LAA clipping in 12/2014, NSTEMI 05/2015 s/p DES to native RCA and SVG-dRCA; occluded ramus-SVG was treated medically). c. neg nuc 10/2015 at [facility].  . Chronic diastolic CHF (congestive heart failure) ([place])   . Chronic fatigue   . CKD (chronic kidney disease), stage II   . DEPRESSION 12/31/2009  . [place] DISEASE, CERVICAL 12/31/2009  . [place] DISEASE, LUMBAR 12/31/2009  . DIVERTICULITIS, HX OF 12/31/2009  . Essential hypertension   . Gait abnormality 03/25/2017  . GLUCOSE INTOLERANCE 12/31/2009  . Habitual alcohol use   . Hypercalcemia   . Hyperkalemia   . Hyperlipidemia 12/31/2009  . MENOPAUSE, EARLY 12/31/2009  . NSTEMI (non-ST elevated myocardial infarction) ([place]) 05/11/2015  . Orthostatic hypotension   . OSTEOARTHRITIS, HAND   . Pacemaker 01/21/2012   MDT Adapta dual chamber  . Paroxysmal atrial flutter ([place])   . Persistent atrial fibrillation ([place])   . Pre-diabetes    PATIENT DENIES  . PVD (peripheral vascular disease), hx stents to bil SFAs 02/2010   . Symptomatic sinus bradycardia 01/22/2012  . Syncope    a. 11/2015 ? due to medications.  . Tachy-brady syndrome ([place])    a. s/p MDT PPM 2013.  . Tricuspid regurgitation     Past Surgical History:  Procedure Laterality Date  . ABDOMINAL AORTOGRAM W/LOWER EXTREMITY Bilateral 02/15/2017   Procedure: ABDOMINAL AORTOGRAM W/LOWER EXTREMITY;  Surgeon: Lorretta Harp, MD;  Location: [place] [facility];  Service: Cardiovascular;  Laterality: Bilateral;  bilat  . AUGMENTATION MAMMAPLASTY  1983  . CARDIAC CATHETERIZATION N/A 01/01/2015   Procedure: Left Heart Cath and Coronary Angiography;  Surgeon: Jettie Booze, MD;  Location: [place] [facility];  Service: Cardiovascular;  Laterality: N/A;  . CARDIAC CATHETERIZATION N/A 05/12/2015   Procedure: Left Heart Cath and Coronary Angiography;  Surgeon: Troy Sine, MD;  Location: [place] [facility];  Service: Cardiovascular;  Laterality: N/A;  . CARDIAC CATHETERIZATION N/A 05/12/2015   Procedure: Coronary Stent Intervention;  Surgeon: Troy Sine, MD;  Location: [place] [facility];  Service: Cardiovascular;  Laterality: N/A;  . CARDIOVERSION N/A 02/01/2015   Procedure: CARDIOVERSION;  Surgeon: Lelon Perla, MD;  Location: [facility] ENDOSCOPY;  Service: Cardiovascular;  Laterality: N/A;  . CARDIOVERSION N/A 08/30/2015   Procedure: CARDIOVERSION;  Surgeon: Sanda Klein, MD;  Location: [place] ENDOSCOPY;  Service: Cardiovascular;  Laterality: N/A;  . CARPAL TUNNEL RELEASE  2008   "right hand/thumb; carpal tunnel repair; got rid of arthritis" (01/21/2012)  . CLIPPING OF ATRIAL APPENDAGE N/A 01/04/2015   Procedure: CLIPPING OF ATRIAL APPENDAGE;  Surgeon: Grace Isaac, MD;  Location: [place];  Service: Open Heart Surgery;  Laterality: N/A;  . COLONOSCOPY N/A 12/21/2015   Procedure: COLONOSCOPY;  Surgeon: Milus Banister, MD;  Location: [facility] ENDOSCOPY;  Service: Endoscopy;  Laterality: N/A;  . CORONARY ARTERY BYPASS  GRAFT N/A 01/04/2015   Procedure: CORONARY ARTERY BYPASS GRAFTING (CABG) x 3 using left internal mammory artery and greater saphenous vein right leg harvested endoscopically.;  Surgeon: Grace Isaac, MD;  LIMA-LAD, SVG-RI, SVG-PDA  . ESOPHAGOGASTRODUODENOSCOPY (EGD) WITH PROPOFOL N/A 12/18/2015   Procedure: ESOPHAGOGASTRODUODENOSCOPY (EGD) WITH PROPOFOL;  Surgeon: Milus Banister, MD;  Location: [facility] ENDOSCOPY;  Service: Endoscopy;  Laterality: N/A;  . FACELIFT, LOWER 2/3  1995   "mini" (01/21/2012)  . GIVENS CAPSULE STUDY N/A 12/21/2015   Procedure: GIVENS CAPSULE STUDY;  Surgeon: Milus Banister, MD;  Location: [facility] ENDOSCOPY;  Service: Endoscopy;  Laterality: N/A;  . Lower  Arterial Examination  10/28/2011   R. SFA stent mild-moderate mixed density plaque with elevated velocities consistent with 50% diameter reduction. L. SFA stent moderate mixed denisty plaque at mid to distal level consistent with 50-69% diameter reduction.  . LOWER EXTREMITY ANGIOGRAPHY N/A 02/15/2017   Procedure: LOWER EXTREMITY ANGIOGRAPHY;  Surgeon: Lorretta Harp, MD;  Location: [place] [facility];  Service: Cardiovascular;  Laterality: N/A;  . OOPHORECTOMY  ~1979  . PARTIAL COLECTOMY  2010  . PERIPHERAL ARTERIAL STENT GRAFT  2012; 2012   "LLE; RLE" (01/21/2012)  . PERIPHERAL VASCULAR BALLOON ANGIOPLASTY Right 02/15/2017   Procedure: PERIPHERAL VASCULAR BALLOON ANGIOPLASTY;  Surgeon: Lorretta Harp, MD;  Location: [place] [facility];  Service: Cardiovascular;  Laterality: Right;  SFA    . PERMANENT PACEMAKER INSERTION N/A 01/21/2012   Medtronic Adapta L implanted by Dr Sallyanne Kuster for tachy/brady syndrome  . POSTERIOR CERVICAL LAMINECTOMY  1985  . TEE WITHOUT CARDIOVERSION N/A 01/04/2015   Procedure: TRANSESOPHAGEAL ECHOCARDIOGRAM (TEE);  Surgeon: Grace Isaac, MD;  Location: [place];  Service: Open Heart Surgery;  Laterality: N/A;  . TEE WITHOUT CARDIOVERSION N/A 02/01/2015   Procedure: TRANSESOPHAGEAL ECHOCARDIOGRAM (TEE);  Surgeon: Lelon Perla, MD;  Location: [facility] ENDOSCOPY;  Service: Cardiovascular;  Laterality: N/A;  . TEE WITHOUT CARDIOVERSION N/A 08/30/2015   Procedure: TRANSESOPHAGEAL ECHOCARDIOGRAM (TEE);  Surgeon: Sanda Klein, MD;  Location: [facility] ENDOSCOPY;  Service: Cardiovascular;  Laterality: N/A;  . VAGINAL HYSTERECTOMY  1975    Current Medications: Outpatient Medications Prior to Visit  Medication Sig Dispense Refill  . alendronate (FOSAMAX) 70 MG tablet Take 70 mg by mouth every Thursday.     . carvedilol (COREG) 12.5 MG tablet Take 1 tablet (12.5 mg total) by mouth 2 (two) times daily with a meal. 180 tablet 3  . cholecalciferol (VITAMIN D) 1000 UNITS tablet Take  1,000 Units by mouth 2 (two) times a week.     . citalopram (CELEXA) 20 MG tablet Take 0.5 tablets (10 mg total) by mouth daily.    . clopidogrel (PLAVIX) 75 MG tablet TAKE 1 TABLET EVERY DAY WITH BREAKFAST. 30 tablet 6  . Coenzyme Q10 (CO Q-10) 120 MG CAPS Take 120 mg by mouth at bedtime.    . dofetilide (TIKOSYN) 250 MCG capsule Take 250 mcg by mouth 2 (two) times daily. (0900 & 2100)    . levothyroxine (SYNTHROID, LEVOTHROID) 25 MCG tablet Take 25-50 mcg by mouth daily before breakfast. Take 1 tablet (25 mcg) by mouth Monday through Thursday, then take 2 tablets (50 mcg) on Friday, Saturday, & Sunday.    Mar[place] Kitchen MAGNESIUM CITRATE PO Take 400 mg by mouth daily.    . Multiple Vitamin (MULTIVITAMIN WITH MINERALS) TABS tablet Take 1 tablet by mouth 2 (two) times daily.    . nitroGLYCERIN (NITROSTAT) 0.4 MG SL tablet Place 1 tablet (0.4 mg  total) under the tongue every 5 (five) minutes x 3 doses as needed for chest pain. 25 tablet 2  . Omega-3 Fatty Acids (SUPER OMEGA-3) 1000 MG CAPS Take 1,000 mg by mouth at bedtime.      No facility-administered medications prior to visit.      Allergies:   Brilinta [ticagrelor]; Clonidine derivatives; Atorvastatin; Crestor [rosuvastatin calcium]; Exforge [amlodipine besylate-valsartan]; and Spironolactone   Social History   Socioeconomic History  . Marital status: Widowed    Spouse name: None  . Number of children: 3  . Years of education: None  . Highest education level: None  Social Needs  . Financial resource strain: None  . Food insecurity - worry: None  . Food insecurity - inability: None  . Transportation needs - medical: None  . Transportation needs - non-medical: None  Occupational History  . 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 Smoking status: Former Smoker    Packs/day: 0.75    Years: 10.00    Pack years: 7.50    Types: Cigarettes  . Smokeless tobacco: Never Used  . Tobacco comment: 01/21/2012 "quit  smoking ~ 2002"  Substance and Sexual Activity  . Alcohol use: Yes    Comment: admits to drinking 3+ drinks last night, and possibly daily  . Drug use: No  . Sexual activity: Not Currently  Other Topics Concern  . None  Social History Narrative   Lives in [place].  Retired.     Family History:  The patient's family history includes CVA in her brother; Heart attack in her brother; Heart disease in her brother, father, and mother; Hypertension in her brother and mother; Stroke in her father and mother.   ROS:   Please see the history of present illness.    ROS All other systems reviewed and are negative.   PHYSICAL EXAM:   VS:  BP 130/80   Pulse 89   Ht 5\' 5"  (1.651 m)   Wt 153 lb 3.2 oz (69.5 kg)   SpO2 98%   BMI 25.49 kg/m     General: Alert, oriented x3, no distress Head: no evidence of trauma, PERRL, EOMI, no exophtalmos or lid lag, no myxedema, no xanthelasma; normal ears, nose and oropharynx Neck: normal jugular venous pulsations and no hepatojugular reflux; brisk carotid pulses without delay and no carotid bruits Chest: clear to auscultation, no signs of consolidation by percussion or palpation, normal fremitus, symmetrical and full respiratory excursions Cardiovascular: normal position and quality of the apical impulse, regular rhythm, normal first and second heart sounds, no murmurs, rubs or gallops Abdomen: no tenderness or distention, no masses by palpation, no abnormal pulsatility or arterial bruits, normal bowel sounds, no hepatosplenomegaly Extremities: no clubbing, cyanosis or edema; 2+ radial, ulnar and brachial pulses bilaterally; 2+ right femoral, posterior tibial and dorsalis pedis pulses; 2+ left femoral, posterior tibial and dorsalis pedis pulses; no subclavian or femoral bruits Neurological: grossly nonfocal Psych: Normal mood and affect   Wt Readings from Last 3 Encounters:  04/27/17 153 lb 3.2 oz (69.5 kg)  03/25/17 151 lb (68.5 kg)  03/25/17 151 lb  (68.5 kg)      Studies/Labs Reviewed:   EKG:  EKG is ordered today.  Atrial paced, ventricular sensed rhythm with a AV delay of 250 ms, old ST segment depression in leads V4-V6, QTC normal at 417 ms.   Recent Labs: 05/30/2016: B Natriuretic Peptide 864.9; TSH 3.302 10/14/2016: Magnesium 2.2; NT-Pro BNP 991 02/16/2017: BUN  11; Creatinine, Ser 0.89; Hemoglobin 12.8; Platelets 165; Potassium 4.8; Sodium 137   Lipid Panel    Component Value Date/Time   CHOL 158 05/12/2015 0324   TRIG 114 05/12/2015 0324   HDL 64 05/12/2015 0324   CHOLHDL 2.5 05/12/2015 0324   VLDL 23 05/12/2015 0324   LDLCALC 71 05/12/2015 0324   10/04/2015  BUN 38 creatinine 1.46, Ca 11.6, K 5.8.  Total cholesterol 244, triglycerides 195, HDL 63, LDL 142 10/15/2015  creatinine 1.05, BUN 23, K 6.3, Ca 11.6, ionized calcium  6.0. Intact PTH 83. TSH of 4.88,  free T4 of 1.13. 09/09/2016 Creatinine 1.08, BUN 18, potassium 5.1  ASSESSMENT:    1. PAF (paroxysmal atrial fibrillation) ([place])   2. Cerebrovascular small vessel disease   3. Pacemaker   4. Chronic diastolic heart failure ([place])   5. Coronary artery disease involving coronary bypass graft of native heart without angina pectoris   6. Encounter for monitoring dofetilide therapy   7. PAD (peripheral artery disease) ([place])   8. Essential hypertension   9. Primary hyperparathyroidism ([place])      PLAN:  In order of problems listed above:  1. ASCVD: Dr. Tobey Grim recent evaluation is greatly appreciated.  She has mostly small vessel disease and the plan is for her to remain on clopidogrel indefinitely. 2. AFib: Remarkably durable reduction in arrhythmia burden after ablation and dofetilide therapy.  She has had previous clipping of her left atrial appendage.  She is at high risk for bleeding complications due to a tendency to fall, which has recently lead to serious injuries.  The risk of anticoagulation is not worth it. 3. PPM: She has a Medtronic Adapta device and 5076  atrial and ventricular leads.  Although her pacemaker generator is not formally an MRI conditional device, with appropriate precautions we should be able to perform a brain MRI if neurology thinks it is important for diagnostic evaluation.  Normal device function last check a month ago. Remote download every 3 months and yearly office visits.  Her device was not interrogated today. 4. CHF: She is currently asymptomatic and clinically euvolemic.  It does appear that she has gained some true weight and we have to reestablish her "dry weight" at around today's weight of 153 pounds.. Preserved left ventricular systolic function.  She is currently not taking any daily diuretic.  Reminded her that if she does take a "as needed" dose of furosemide she has to take potassium.  Hypokalemia would be quite dangerous while on treatment with dofetilide. 5. CAD: No angina, asymptomatic. Unfortunately, she had early failure of 2 saphenous vein grafts, one of which was treated with a drug-eluting stent. Her mammary artery bypass was widely patent.. 6. Dofetilide: QTC is well within normal range. Reminded her about the risks of drug interactions with other QT prolonging agents. 7. PAD: Improvement in intermittent claudication after her recent procedure with Dr. Gwenlyn Found.  Note that she also has a lower blood pressure in her left upper extremity compared to the right. 8. HTN: At target today.  Her blood pressure should always be checked in the right upper extremity. Her ACE inhibitor has been stopped due to hyperkalemia. No changes made to blood pressure medicines today. 9. Hypercalcemia: Scheduled for parathyroidectomy on April 15 with Dr. Harlow Asa.  To stop her antiplatelet agent at least 5 days before that.    Medication Adjustments/Labs and Tests Ordered: Current medicines are reviewed at length with the patient today.  Concerns regarding medicines are outlined above.  Medication changes, Labs and Tests ordered today are  listed in the Patient Instructions below. Patient Instructions  Medication Instructions:  Your physician recommends that you continue on your current medications as directed. Please refer to the Current Medication list given to you today.  Labwork: NONE  Testing/Procedures: NONE  Follow-Up: Your physician recommends that you schedule a follow-up appointment in: 6 MONTH OV   Any Other Special Instructions Will Be Listed Below (If Applicable). OK TO HOLD YOUR PLAVIX STARTING ON 05/08/17 RESUME WHEN OK WITH SURGEON    If you need a refill on your cardiac medications before your next appointment, please call your pharmacy.     Signed, Sanda Klein, MD  04/27/2017 8:03 PM    [address], [place], [place]  [address] Phone: [phone]; Fax: [phone]

## 2017-04-28 NOTE — Telephone Encounter (Signed)
Discussed with patient at office visit yesterday, 04/27/17.

## 2017-04-29 DIAGNOSIS — G894 Chronic pain syndrome: Secondary | ICD-10-CM | POA: Diagnosis not present

## 2017-04-29 DIAGNOSIS — M6281 Muscle weakness (generalized): Secondary | ICD-10-CM | POA: Diagnosis not present

## 2017-05-04 DIAGNOSIS — M6281 Muscle weakness (generalized): Secondary | ICD-10-CM | POA: Diagnosis not present

## 2017-05-04 DIAGNOSIS — G894 Chronic pain syndrome: Secondary | ICD-10-CM | POA: Diagnosis not present

## 2017-05-05 DIAGNOSIS — R69 Illness, unspecified: Secondary | ICD-10-CM | POA: Diagnosis not present

## 2017-05-06 NOTE — Progress Notes (Addendum)
Last device check 03-18-17 epic  telephone note dr Jannifer Franklin 04-19-17 neurology epic Ct angio neck 04-19-17 epic lov 03-25-17 dr Jannifer Franklin neurology epic Pacemaker orders on chart dr croitoru no rep needed for 05-13-17 surgery

## 2017-05-06 NOTE — Progress Notes (Signed)
Spoke with dr Mateo Flow hatchett mda and made aware patient medical history and lov note dr croitoru 04-27-17 cardiology epic with instructions to stop antiplatlet agent s days prior to surgery on 05-13-17 with dr gerkin, echo 10-05-16 epic, ekg 03-18-17 epic, patient ok for surgery per dr Mateo Flow hatchett mda

## 2017-05-06 NOTE — Patient Instructions (Addendum)
[name]  05/06/2017   Your procedure is scheduled on: Thursday 05-13-17  Report to [facility] Main  Entrance  Report to admitting at 930 AM    Call this number if you have problems the morning of surgery [phone]   Remember: Do not eat food or drink liquids :After Midnight.     Take these medicines the morning of surgery with A SIP OF WATER: DOFETILIDE (TIKOSYN) CITALOPRAM (CELEXA), CARVEDILOL (COREG), LEVOTHYROXINE (SYNTHROID)                                You may not have any metal on your body including hair pins and              piercings  Do not wear jewelry, make-up, lotions, powders or perfumes, deodorant             Do not wear nail polish.  Do not shave  48 hours prior to surgery.              Do not bring valuables to the hospital. [place].  Contacts, dentures or bridgework may not be worn into surgery.       Patients discharged the day of surgery will not be allowed to drive home.  Name and phone number of your driver:  Special Instructions: N/A              Please read over the following fact sheets you were given: _____________________________________________________________________             [facility] - Preparing for Surgery Before surgery, you can play an important role.  Because skin is not sterile, your skin needs to be as free of germs as possible.  You can reduce the number of germs on your skin by washing with CHG (chlorahexidine gluconate) soap before surgery.  CHG is an antiseptic cleaner which kills germs and bonds with the skin to continue killing germs even after washing. Please DO NOT use if you have an allergy to CHG or antibacterial soaps.  If your skin becomes reddened/irritated stop using the CHG and inform your nurse when you arrive at Short Stay. Do not shave (including legs and underarms) for at least 48 hours prior to the first CHG shower.  You may shave  your face/neck. Please follow these instructions carefully:  1.  Shower with CHG Soap the night before surgery and the  morning of Surgery.  2.  If you choose to wash your hair, wash your hair first as usual with your  normal  shampoo.  3.  After you shampoo, rinse your hair and body thoroughly to remove the  shampoo.                           4.  Use CHG as you would any other liquid soap.  You can apply chg directly  to the skin and wash                       Gently with a scrungie or clean washcloth.  5.  Apply the CHG Soap to your body ONLY FROM THE NECK DOWN.   Do not use  on face/ open                           Wound or open sores. Avoid contact with eyes, ears mouth and genitals (private parts).                       Wash face,  Genitals (private parts) with your normal soap.             6.  Wash thoroughly, paying special attention to the area where your surgery  will be performed.  7.  Thoroughly rinse your body with warm water from the neck down.  8.  DO NOT shower/wash with your normal soap after using and rinsing off  the CHG Soap.                9.  Pat yourself dry with a clean towel.            10.  Wear clean pajamas.            11.  Place clean sheets on your bed the night of your first shower and do not  sleep with pets. Day of Surgery : Do not apply any lotions/deodorants the morning of surgery.  Please wear clean clothes to the hospital/surgery center.  FAILURE TO FOLLOW THESE INSTRUCTIONS MAY RESULT IN THE CANCELLATION OF YOUR SURGERY PATIENT SIGNATURE_________________________________  NURSE SIGNATURE__________________________________  ________________________________________________________________________

## 2017-05-09 ENCOUNTER — Encounter (HOSPITAL_COMMUNITY): Payer: Self-pay | Admitting: Surgery

## 2017-05-09 NOTE — H&P (Addendum)
General Surgery [facility]  [name] DOB: [birth year] Widowed / Language: Cleophus Molt / Race: White Female   History of Present Illness  The patient is a 77 year old female who presents with primary hyperparathyroidism.  CC: primary hyperparathyroidism  Patient is referred by Dr. Jacelyn Pi for surgical evaluation and management of suspected primary hyperparathyroidism. Patient's primary care physician is Dr. Pamella Pert at [place], and her neurologist is Dr. Floyde Parkins. Patient has been noted for years to have an elevated serum calcium level. Most recent level is 10.3. Additional laboratory testing showed an elevated intact PTH level of 145. 25-hydroxy vitamin D level is normal at 59. Patient has had complications including recent rib fractures after a fall and osteopenia. Patient has had TIAs and is currently taking Plavix. Patient has not had any imaging studies performed.  There is no family history of hyperparathyroidism. The patient's mother did have thyroid disease of unknown type. There is no family history of other endocrine neoplasms.   Problem List/Past Medical SYMPTOMATIC CHOLELITHIASIS (K80.20)  PRIMARY HYPERPARATHYROIDISM (E21.0)   Past Surgical History Breast Augmentation  Bilateral. Cataract Surgery  Right. Colon Removal - Partial  Coronary Artery Bypass Graft  Hysterectomy (not due to cancer) - Complete  Resection of Small Bowel  Spinal Surgery - Neck   Diagnostic Studies History  Colonoscopy  5-10 years ago Mammogram  within last year Pap Smear  never  Allergies AmLODIPine Besylate *CALCIUM CHANNEL BLOCKERS*  Atorvastatin Calcium *ANTIHYPERLIPIDEMICS*  CLONIDINE  Clonidine and Derivatives  CLOPIDOGREL BISULFATE  ROSUVASTATIN CALCIUM  Spironolactone *DIURETICS*  TICAGRELOR  Allergies Reconciled   Medication History Carvedilol (3.125MG  Tablet, Oral) Active. Lisinopril (20MG  Tablet, Oral)  Active. Eliquis (5MG  Tablet, Oral) Active. Alendronate Sodium (70MG  Tablet, Oral once a week) Active. Apixaban (5MG  Tablet, Oral two times daily) Active. Biotin (5000MCG Tablet, Oral) Active. Carvedilol (6.25MG  Tablet, Oral two times daily) Active. Vitamin D (1000UNIT Tablet, Oral) Active. Citalopram Hydrobromide (20MG  Tablet, Oral daily) Active. CoQ-10 & Fish Oil (120-180-50-30 Capsule, Oral) Active. Flax Seed Oil (1300MG  Capsule, Oral daily) Active. Levothyroxine Sodium (25MCG Tablet, Oral) Active. Probiotic Product (Oral) Active. Magnesium (400MG  Tablet, Oral) Active. Medications Reconciled  Social History Alcohol use  Moderate alcohol use. Caffeine use  Carbonated beverages, Coffee. No drug use  Tobacco use  Former smoker.  Family History Cerebrovascular Accident  Father, Mother. Depression  Mother. Heart Disease  Brother, Mother. Heart disease in female family member before age 84  Heart disease in female family member before age 59  Hypertension  Brother, Mother, Son. Thyroid problems  Mother.  Pregnancy / Birth History Age at menarche  89 years. Age of menopause  49-55 Gravida  3 Irregular periods  Maternal age  70-20 Para  3  Other Problems Anxiety Disorder  Atrial Fibrillation  Back Pain  Congestive Heart Failure  Diverticulosis  High blood pressure  Home Oxygen Use  Myocardial infarction  Oophorectomy  Bilateral. Pulmonary Embolism / Blood Clot in Legs  Thyroid Disease  Ulcerative Colitis  Vascular Disease   Vitals Weight: 151 lb Height: 65in Body Surface Area: 1.76 m Body Mass Index: 25.13 kg/m  Temp.: 98.91F  Pulse: 105 (Regular)  BP: 122/74 (Sitting, Left Arm, Standard)   Physical Exam  See vital signs recorded above  GENERAL APPEARANCE Development: normal Nutritional status: normal Gross deformities: none  SKIN Rash, lesions, ulcers: none Induration, erythema: none Nodules:  none palpable  EYES Conjunctiva and lids: normal Pupils: equal and reactive Iris: normal bilaterally  EARS, NOSE, MOUTH,  THROAT External ears: no lesion or deformity External nose: no lesion or deformity Hearing: grossly normal Lips: no lesion or deformity Dentition: normal for age Oral mucosa: moist  NECK Symmetric: yes Trachea: midline Thyroid: no palpable nodules in the thyroid bed  CHEST Respiratory effort: normal Retraction or accessory muscle use: no Breath sounds: normal bilaterally Rales, rhonchi, wheeze: none  CARDIOVASCULAR Auscultation: regular rhythm, normal rate Murmurs: none Pulses: carotid and radial pulse 2+ palpable Lower extremity edema: none Lower extremity varicosities: none  MUSCULOSKELETAL Station and gait: normal Digits and nails: no clubbing or cyanosis Muscle strength: grossly normal all extremities Range of motion: grossly normal all extremities Deformity: none  LYMPHATIC Cervical: none palpable Supraclavicular: none palpable  PSYCHIATRIC Oriented to person, place, and time: yes Mood and affect: normal for situation Judgment and insight: appropriate for situation    Assessment & Plan  PRIMARY HYPERPARATHYROIDISM (E21.0)  Pt Education - Pamphlet Given - The Parathyroid Surgery Book: discussed with patient and provided information. Follow Up - Call [facility] office after tests / studies doneto discuss further plans  Patient presents for evaluation of suspected primary hyperparathyroidism. Patient is provided with written literature on parathyroid surgery to review at home.  Biochemically the patient does have primary hyperparathyroidism. I believe she is symptomatic with chronic fatigue, recent rib fractures, and osteopenia by bone density scanning. Laboratory studies are consistent with primary hyperparathyroidism. I have recommended proceeding with nuclear medicine parathyroid scanning. This has been ordered by the patient's  endocrinologist but has not been scheduled. We will try to facilitate that. She and I discussed the sestamibi scan and the anticipated results. We discussed the potential need for ultrasonography or 4D CT scan if the sestamibi scan was negative.  We also discussed minimally invasive parathyroid surgery. We discussed doing this as an outpatient. I provided her with written literature on parathyroid surgery to review at home.  Patient was undergone nuclear medicine parathyroid scan. We will review those results and then contact the patient. Hopefully this will be a positive study and we will be able to move forward with minimally invasive outpatient surgery.   ADDENDUM:  Sestamibi is positive for left inferior adenoma.  Plan minimally invasive approach.  The risks and benefits of the procedure have been discussed at length with the patient.  The patient understands the proposed procedure, potential alternative treatments, and the course of recovery to be expected.  All of the patient's questions have been answered at this time.  The patient wishes to proceed with surgery.  Armandina Gemma, [place] [facility] Office: [phone]

## 2017-05-10 ENCOUNTER — Encounter (HOSPITAL_COMMUNITY)
Admission: RE | Admit: 2017-05-10 | Discharge: 2017-05-10 | Disposition: A | Discharge status: Home / Self Care | Payer: Medicare HMO | Source: Ambulatory Visit | Attending: Surgery | Admitting: Surgery

## 2017-05-10 ENCOUNTER — Other Ambulatory Visit: Payer: Self-pay

## 2017-05-10 ENCOUNTER — Encounter (HOSPITAL_COMMUNITY): Payer: Self-pay

## 2017-05-10 DIAGNOSIS — Z01812 Encounter for preprocedural laboratory examination: Secondary | ICD-10-CM

## 2017-05-10 DIAGNOSIS — I252 Old myocardial infarction: Secondary | ICD-10-CM | POA: Diagnosis not present

## 2017-05-10 DIAGNOSIS — Z888 Allergy status to other drugs, medicaments and biological substances status: Secondary | ICD-10-CM | POA: Diagnosis not present

## 2017-05-10 DIAGNOSIS — I110 Hypertensive heart disease with heart failure: Secondary | ICD-10-CM | POA: Diagnosis not present

## 2017-05-10 DIAGNOSIS — Z8349 Family history of other endocrine, nutritional and metabolic diseases: Secondary | ICD-10-CM | POA: Diagnosis not present

## 2017-05-10 DIAGNOSIS — Z7901 Long term (current) use of anticoagulants: Secondary | ICD-10-CM | POA: Diagnosis not present

## 2017-05-10 DIAGNOSIS — D351 Benign neoplasm of parathyroid gland: Secondary | ICD-10-CM | POA: Diagnosis not present

## 2017-05-10 DIAGNOSIS — I509 Heart failure, unspecified: Secondary | ICD-10-CM | POA: Diagnosis not present

## 2017-05-10 DIAGNOSIS — Z9049 Acquired absence of other specified parts of digestive tract: Secondary | ICD-10-CM | POA: Diagnosis not present

## 2017-05-10 DIAGNOSIS — Z8249 Family history of ischemic heart disease and other diseases of the circulatory system: Secondary | ICD-10-CM | POA: Diagnosis not present

## 2017-05-10 DIAGNOSIS — Z79899 Other long term (current) drug therapy: Secondary | ICD-10-CM | POA: Diagnosis not present

## 2017-05-10 DIAGNOSIS — Z79890 Hormone replacement therapy (postmenopausal): Secondary | ICD-10-CM | POA: Diagnosis not present

## 2017-05-10 DIAGNOSIS — Z9981 Dependence on supplemental oxygen: Secondary | ICD-10-CM | POA: Diagnosis not present

## 2017-05-10 DIAGNOSIS — Z87891 Personal history of nicotine dependence: Secondary | ICD-10-CM | POA: Diagnosis not present

## 2017-05-10 DIAGNOSIS — I4891 Unspecified atrial fibrillation: Secondary | ICD-10-CM | POA: Diagnosis not present

## 2017-05-10 DIAGNOSIS — I739 Peripheral vascular disease, unspecified: Secondary | ICD-10-CM | POA: Diagnosis not present

## 2017-05-10 DIAGNOSIS — E210 Primary hyperparathyroidism: Secondary | ICD-10-CM | POA: Diagnosis present

## 2017-05-10 DIAGNOSIS — Z950 Presence of cardiac pacemaker: Secondary | ICD-10-CM | POA: Diagnosis not present

## 2017-05-10 DIAGNOSIS — Z86711 Personal history of pulmonary embolism: Secondary | ICD-10-CM | POA: Diagnosis not present

## 2017-05-10 DIAGNOSIS — Z823 Family history of stroke: Secondary | ICD-10-CM | POA: Diagnosis not present

## 2017-05-10 HISTORY — DX: Personal history of transient ischemic attack (TIA), and cerebral infarction without residual deficits: Z86.73

## 2017-05-10 HISTORY — DX: Rash and other nonspecific skin eruption: R21

## 2017-05-10 LAB — BASIC METABOLIC PANEL
Anion gap: 8 (ref 5–15)
BUN: 21 mg/dL — ABNORMAL HIGH (ref 6–20)
CO2: 28 mmol/L (ref 22–32)
Calcium: 10.4 mg/dL — ABNORMAL HIGH (ref 8.9–10.3)
Chloride: 101 mmol/L (ref 101–111)
Creatinine, Ser: 0.92 mg/dL (ref 0.44–1.00)
GFR calc Af Amer: >60 mL/min (ref >60)
GFR calc non Af Amer: 59 mL/min — ABNORMAL LOW (ref >60)
Glucose, Bld: 103 mg/dL — ABNORMAL HIGH (ref 65–99)
Potassium: 4.6 mmol/L (ref 3.5–5.1)
Sodium: 137 mmol/L (ref 135–145)

## 2017-05-10 LAB — CBC
HCT: 44.5 % (ref 36.0–46.0)
Hemoglobin: 15.1 g/dL — ABNORMAL HIGH (ref 12.0–15.0)
MCH: 30.8 pg (ref 26.0–34.0)
MCHC: 33.9 g/dL (ref 30.0–36.0)
MCV: 90.8 fL (ref 78.0–100.0)
Platelets: 250 10*3/uL (ref 150–400)
RBC: 4.9 MIL/uL (ref 3.87–5.11)
RDW: 12.8 % (ref 11.5–15.5)
WBC: 6.5 10*3/uL (ref 4.0–10.5)

## 2017-05-12 ENCOUNTER — Ambulatory Visit: Payer: Medicare HMO | Admitting: Emergency Medicine

## 2017-05-12 ENCOUNTER — Other Ambulatory Visit: Payer: Self-pay

## 2017-05-12 ENCOUNTER — Encounter (HOSPITAL_COMMUNITY): Payer: Self-pay

## 2017-05-12 ENCOUNTER — Emergency Department (HOSPITAL_COMMUNITY)
Admission: EM | Admit: 2017-05-12 | Discharge: 2017-05-12 | Disposition: A | Discharge status: Home / Self Care | Payer: Medicare HMO | Attending: Emergency Medicine | Admitting: Emergency Medicine

## 2017-05-12 DIAGNOSIS — I130 Hypertensive heart and chronic kidney disease with heart failure and stage 1 through stage 4 chronic kidney disease, or unspecified chronic kidney disease: Secondary | ICD-10-CM | POA: Insufficient documentation

## 2017-05-12 DIAGNOSIS — I5032 Chronic diastolic (congestive) heart failure: Secondary | ICD-10-CM | POA: Insufficient documentation

## 2017-05-12 DIAGNOSIS — Z87891 Personal history of nicotine dependence: Secondary | ICD-10-CM | POA: Insufficient documentation

## 2017-05-12 DIAGNOSIS — Z79899 Other long term (current) drug therapy: Secondary | ICD-10-CM | POA: Insufficient documentation

## 2017-05-12 DIAGNOSIS — I2510 Atherosclerotic heart disease of native coronary artery without angina pectoris: Secondary | ICD-10-CM | POA: Diagnosis not present

## 2017-05-12 DIAGNOSIS — Z951 Presence of aortocoronary bypass graft: Secondary | ICD-10-CM | POA: Insufficient documentation

## 2017-05-12 DIAGNOSIS — N182 Chronic kidney disease, stage 2 (mild): Secondary | ICD-10-CM | POA: Diagnosis not present

## 2017-05-12 DIAGNOSIS — Z950 Presence of cardiac pacemaker: Secondary | ICD-10-CM | POA: Insufficient documentation

## 2017-05-12 DIAGNOSIS — R51 Headache: Secondary | ICD-10-CM | POA: Diagnosis not present

## 2017-05-12 DIAGNOSIS — M542 Cervicalgia: Secondary | ICD-10-CM | POA: Diagnosis not present

## 2017-05-12 MED ORDER — DIAZEPAM 5 MG PO TABS: 5.0000 mg | ORAL_TABLET | Freq: Three times a day (TID) | ORAL | 0 refills | Status: DC | PRN

## 2017-05-12 MED ORDER — LIDOCAINE-EPINEPHRINE (PF) 2 %-1:200000 IJ SOLN
10.0000 mL | Freq: Once | INTRAMUSCULAR | Status: AC
  Administered 2017-05-12: 10 mL
  Filled 2017-05-12: qty 20

## 2017-05-12 MED ORDER — DIAZEPAM 5 MG PO TABS
5.0000 mg | ORAL_TABLET | Freq: Once | ORAL | Status: AC
  Administered 2017-05-12: 5 mg via ORAL
  Filled 2017-05-12: qty 1

## 2017-05-12 MED ORDER — DICLOFENAC SODIUM 1 % TD GEL: 2.0000 g | Freq: Four times a day (QID) | TRANSDERMAL | 1 refills | Status: DC

## 2017-05-12 NOTE — ED Triage Notes (Signed)
Patient reports that she fell in January. Patient states she takes Plavix. Patient reports "skull pain" and posterior neck since she fell, but much worse in the past few days.

## 2017-05-12 NOTE — ED Notes (Addendum)
Pt currently talking to her surgeon to make sure if this ER visit will affect the surgery she has scheduled for tomorrow.

## 2017-05-12 NOTE — ED Provider Notes (Signed)
[facility] DEPT Provider Note   CSN: [id number] Arrival date & time: 05/12/17  1058     History   Chief Complaint Chief Complaint  Patient presents with  . Headache  . Neck Pain    HPI [name] is a 77 y.o. female.  Patient is a 77 year old female with a history of chronic kidney disease, CHF, extensive coronary artery disease status post CABG and stenting, peripheral artery disease status post stenting On Plavix, tachybradycardia syndrome with pacemaker present who is presenting with 2 months of worsening bilateral neck pain and headache.  Patient states at the end of January she had a fall which was most likely related to syncope and she states the pain started then but has just gradually worsened.  She has not had any other trauma.  She denies any numbness or weakness in her arms or legs.  She has had no fever.  She has had prior surgery to her neck about 20 years ago and states the pain feels similar.  She is having no difficulty walking.  She did try taking some ibuprofen, Tylenol and one Flexeril last night but it does not seem to be helping.  She tried to get a massage but it hurts so badly she cannot stand for anybody to touch her neck.  It is made worse by moving her head left and right  The history is provided by the patient.  Headache   This is a chronic problem. Episode onset: 2 months ago. The problem occurs constantly. The problem has been gradually worsening. Associated with: neck pain. The pain is located in the bilateral region. The quality of the pain is described as throbbing and sharp. The pain is at a severity of 8/10. The pain is severe. The pain radiates to the left neck and right neck. Pertinent negatives include no anorexia, no fever, no malaise/fatigue, no palpitations, no nausea and no vomiting. She has tried NSAIDs for the symptoms. The treatment provided no relief.  Neck Pain   Associated symptoms include headaches.    Past  Medical History:  Diagnosis Date  . ANXIETY 12/31/2009  . Aortic insufficiency    a. mod by echo 2017.  [place] Kitchen CAD (coronary artery disease)    a. s/p CABGx3 and LAA clipping in 12/2014, NSTEMI 05/2015 s/p DES to native RCA and SVG-dRCA; occluded ramus-SVG was treated medically). c. neg nuc 10/2015 at [facility].  . Chronic diastolic CHF (congestive heart failure) ([place])   . Chronic fatigue   . CKD (chronic kidney disease), stage II   . DEPRESSION 12/31/2009  . [place] DISEASE, CERVICAL 12/31/2009  . [place] DISEASE, LUMBAR 12/31/2009  . DIVERTICULITIS, HX OF 12/31/2009  . Essential hypertension   . Gait abnormality 03/25/2017  . GLUCOSE INTOLERANCE 12/31/2009  . Habitual alcohol use   . History of stroke    self reports " they say i have had some pin strokes"   . Hypercalcemia   . Hyperkalemia   . Hyperlipidemia 12/31/2009  . MENOPAUSE, EARLY 12/31/2009  . NSTEMI (non-ST elevated myocardial infarction) ([place]) 05/11/2015  . Orthostatic hypotension   . OSTEOARTHRITIS, HAND   . Pacemaker 01/21/2012   MDT Adapta dual chamber  . Paroxysmal atrial flutter ([place])   . Persistent atrial fibrillation ([place])   . Pre-diabetes    PATIENT DENIES  . PVD (peripheral vascular disease), hx stents to bil SFAs 02/2010   . Rash, skin     superficial raised red pencil point sized rash bilateral forearms;  states " it started when i started the Plavix "   . Symptomatic sinus bradycardia 01/22/2012  . Syncope    a. 11/2015 ? due to medications.  . Syncope    reports on 05-10-17 " i passed out 2 months ago in the bathroom and broke some ribs"    . Tachy-brady syndrome ([place])    a. s/p MDT PPM 2013.  . Tricuspid regurgitation     Patient Active Problem List   Diagnosis Date Noted  . Cerebrovascular small vessel disease 04/27/2017  . Gait abnormality 03/25/2017  . Stroke (cerebrum) ([place]) 03/25/2017  . Claudication in peripheral vascular disease ([place]) 02/15/2017  . Encounter for monitoring dofetilide therapy 09/25/2016  .  Hyperparathyroidism ([place]) 05/13/2016  . Pacemaker 01/10/2016  . Heme positive stool   . Benign neoplasm of ascending colon   . Long term current use of anticoagulant   . Gastrointestinal hemorrhage 12/17/2015  . Acute blood loss anemia 12/17/2015  . CKD (chronic kidney disease), stage II   . Syncope 12/05/2015  . Hypotension 12/05/2015  . Hyperkalemia 12/04/2015  . H/O amiodarone therapy 12/04/2015  . Chronic diastolic heart failure ([place]) 10/19/2015  . Hypercalcemia 10/19/2015  . Atypical atrial flutter ([place])   . Paroxysmal atrial flutter ([place]) 08/23/2015  . Febrile illness, acute 06/27/2015  . Chronic fatigue 06/27/2015  . Stented coronary artery   . NSTEMI (non-ST elevated myocardial infarction) ([place]) 05/11/2015  . S/P CABG x 3 01/04/15 01/10/2015  . CAD (coronary artery disease) 01/04/2015  . Symptomatic sinus bradycardia 01/22/2012  . PAF (paroxysmal atrial fibrillation) ([place]) 01/22/2012  . Sinus node dysfunction ([place]) 01/22/2012  . S/P placement of cardiac pacemaker, medtronic adapta 01/21/12 01/22/2012  . PVD (peripheral vascular disease), hx stents to bil SFAs 02/2010 01/22/2012  . Hyperlipidemia 12/31/2009  . Anxiety 12/31/2009  . DEPRESSION 12/31/2009  . Coronary atherosclerosis 12/31/2009  . OSTEOARTHRITIS, HAND 12/31/2009  . [place] DISEASE, CERVICAL 12/31/2009  . [place] DISEASE, LUMBAR 12/31/2009  . UNSPECIFIED URINARY INCONTINENCE 12/31/2009    Past Surgical History:  Procedure Laterality Date  . ABDOMINAL AORTOGRAM W/LOWER EXTREMITY Bilateral 02/15/2017   Procedure: ABDOMINAL AORTOGRAM W/LOWER EXTREMITY;  Surgeon: Lorretta Harp, MD;  Location: [facility];  Service: Cardiovascular;  Laterality: Bilateral;  bilat  . Ablation of typical atrial flutter (CTI line)  09/05/2016   Dr Antonieta Pert at [facility]   . AUGMENTATION MAMMAPLASTY  1983  . CARDIAC CATHETERIZATION N/A 01/01/2015   Procedure: Left Heart Cath and Coronary Angiography;  Surgeon: Jettie Booze, MD;  Location: [place];  Service: Cardiovascular;  Laterality: N/A;  . CARDIAC CATHETERIZATION N/A 05/12/2015   Procedure: Left Heart Cath and Coronary Angiography;  Surgeon: Troy Sine, MD;  Location: [facility];  Service: Cardiovascular;  Laterality: N/A;  . CARDIAC CATHETERIZATION N/A 05/12/2015   Procedure: Coronary Stent Intervention;  Surgeon: Troy Sine, MD;  Location: [place] LAB;  Service: Cardiovascular;  Laterality: N/A;  . CARDIOVERSION N/A 02/01/2015   Procedure: CARDIOVERSION;  Surgeon: Lelon Perla, MD;  Location: [facility] ENDOSCOPY;  Service: Cardiovascular;  Laterality: N/A;  . CARDIOVERSION N/A 08/30/2015   Procedure: CARDIOVERSION;  Surgeon: Sanda Klein, MD;  Location: [facility] ENDOSCOPY;  Service: Cardiovascular;  Laterality: N/A;  . CARPAL TUNNEL RELEASE  2008   "right hand/thumb; carpal tunnel repair; got rid of arthritis" (01/21/2012)  . CLIPPING OF ATRIAL APPENDAGE N/A 01/04/2015   Procedure: CLIPPING OF ATRIAL APPENDAGE;  Surgeon: Grace Isaac, MD;  Location: [facility] OR;  Service: Open Heart Surgery;  Laterality: N/A;  . COLONOSCOPY N/A 12/21/2015   Procedure: COLONOSCOPY;  Surgeon: Milus Banister, MD;  Location: [facility] ENDOSCOPY;  Service: Endoscopy;  Laterality: N/A;  . CORONARY ARTERY BYPASS GRAFT N/A 01/04/2015   Procedure: CORONARY ARTERY BYPASS GRAFTING (CABG) x 3 using left internal mammory artery and greater saphenous vein right leg harvested endoscopically.;  Surgeon: Grace Isaac, MD;  LIMA-LAD, SVG-RI, SVG-PDA  . ESOPHAGOGASTRODUODENOSCOPY (EGD) WITH PROPOFOL N/A 12/18/2015   Procedure: ESOPHAGOGASTRODUODENOSCOPY (EGD) WITH PROPOFOL;  Surgeon: Milus Banister, MD;  Location: [facility] ENDOSCOPY;  Service: Endoscopy;  Laterality: N/A;  . FACELIFT, LOWER 2/3  1995   "mini" (01/21/2012)  . GIVENS CAPSULE STUDY N/A 12/21/2015   Procedure: GIVENS CAPSULE STUDY;  Surgeon: Milus Banister, MD;  Location: [facility] ENDOSCOPY;  Service: Endoscopy;   Laterality: N/A;  . Lower Arterial Examination  10/28/2011   R. SFA stent mild-moderate mixed density plaque with elevated velocities consistent with 50% diameter reduction. L. SFA stent moderate mixed denisty plaque at mid to distal level consistent with 50-69% diameter reduction.  . LOWER EXTREMITY ANGIOGRAPHY N/A 02/15/2017   Procedure: LOWER EXTREMITY ANGIOGRAPHY;  Surgeon: Lorretta Harp, MD;  Location: [facility];  Service: Cardiovascular;  Laterality: N/A;  . OOPHORECTOMY  ~1979  . PARTIAL COLECTOMY  2010  . PERIPHERAL ARTERIAL STENT GRAFT  2012; 2012   "LLE; RLE" (01/21/2012)  . PERIPHERAL VASCULAR BALLOON ANGIOPLASTY Right 02/15/2017   Procedure: PERIPHERAL VASCULAR BALLOON ANGIOPLASTY;  Surgeon: Lorretta Harp, MD;  Location: [facility];  Service: Cardiovascular;  Laterality: Right;  SFA    . PERMANENT PACEMAKER INSERTION N/A 01/21/2012   Medtronic Adapta L implanted by Dr Sallyanne Kuster for tachy/brady syndrome  . POSTERIOR CERVICAL LAMINECTOMY  1985  . TEE WITHOUT CARDIOVERSION N/A 01/04/2015   Procedure: TRANSESOPHAGEAL ECHOCARDIOGRAM (TEE);  Surgeon: Grace Isaac, MD;  Location: [facility];  Service: Open Heart Surgery;  Laterality: N/A;  . TEE WITHOUT CARDIOVERSION N/A 02/01/2015   Procedure: TRANSESOPHAGEAL ECHOCARDIOGRAM (TEE);  Surgeon: Lelon Perla, MD;  Location: [facility] ENDOSCOPY;  Service: Cardiovascular;  Laterality: N/A;  . TEE WITHOUT CARDIOVERSION N/A 08/30/2015   Procedure: TRANSESOPHAGEAL ECHOCARDIOGRAM (TEE);  Surgeon: Sanda Klein, MD;  Location: [facility] ENDOSCOPY;  Service: Cardiovascular;  Laterality: N/A;  . [place]     OB History   None      Home Medications    Prior to Admission medications   Medication Sig Start Date End Date Taking? Authorizing Provider  alendronate (FOSAMAX) 70 MG tablet Take 70 mg by mouth every Thursday.  03/24/16   [provider]  carvedilol (COREG) 12.5 MG tablet Take 1 tablet (12.5 mg  total) by mouth 2 (two) times daily with a meal. 02/25/17   Croitoru, Mihai, MD  cholecalciferol (VITAMIN D) 1000 UNITS tablet Take 1,000 Units by mouth daily.     [provider]  citalopram (CELEXA) 20 MG tablet Take 0.5 tablets (10 mg total) by mouth daily. 12/09/15   Theodis Blaze, MD  clopidogrel (PLAVIX) 75 MG tablet TAKE 1 TABLET EVERY DAY WITH BREAKFAST. 04/14/17   Croitoru, Mihai, MD  Coenzyme Q10 (CO Q-10) 100 MG CAPS Take 100 mg by mouth at bedtime.     [provider]  dofetilide (TIKOSYN) 250 MCG capsule Take 250 mcg by mouth 2 (two) times daily. (0900 & 2100)    [provider]  levothyroxine (SYNTHROID, LEVOTHROID) 25 MCG tablet Take  25-50 mcg by mouth daily before breakfast. Take 1 tablet (25 mcg) by mouth Monday through Thursday, then take 2 tablets (50 mcg) on Friday, Saturday, & Sunday. 04/03/16   [provider]  MAGNESIUM CITRATE PO Take 400 mg by mouth daily.    [provider]  Multiple Vitamin (MULTIVITAMIN WITH MINERALS) TABS tablet Take 2 tablets by mouth daily.     [provider]  nitroGLYCERIN (NITROSTAT) 0.4 MG SL tablet Place 1 tablet (0.4 mg total) under the tongue every 5 (five) minutes x 3 doses as needed for chest pain. 05/15/15   Lyda Jester M, PA-C  Omega-3 Fatty Acids (SUPER OMEGA-3) 1000 MG CAPS Take 1,000 mg by mouth at bedtime.     [provider]  Phosphatidylserine 100 MG CAPS Take 2 capsules by mouth daily.    [provider]    Family History Family History  Problem Relation Age of Onset  . Heart disease Mother   . Hypertension Mother   . Stroke Mother   . Stroke Father   . Heart disease Father   . Heart disease Brother   . Hypertension Brother        2 brothers  . CVA Brother        2 brothers  . Heart attack Brother        2 brothers    Social History Social History   Tobacco Use  . Smoking status: Former Smoker    Packs/day: 0.75    Years: 10.00    Pack  years: 7.50    Types: Cigarettes    Last attempt to quit: 2008    Years since quitting: 11.2  . Smokeless tobacco: Never Used  . Tobacco comment: 01/21/2012 "quit smoking ~ 2002"  Substance Use Topics  . Alcohol use: Yes    Comment: admits t.o drinking 3+ drinks last night, and possibly daily  . Drug use: No     Allergies   Brilinta [ticagrelor]; Clonidine derivatives; Atorvastatin; Crestor [rosuvastatin calcium]; Exforge [amlodipine besylate-valsartan]; and Spironolactone   Review of Systems Review of Systems  Constitutional: Negative for fever and malaise/fatigue.  Cardiovascular: Negative for palpitations.  Gastrointestinal: Negative for anorexia, nausea and vomiting.  Musculoskeletal: Positive for neck pain.  Neurological: Positive for headaches.  All other systems reviewed and are negative.    Physical Exam Updated Vital Signs BP (!) 145/75   Pulse 75   Temp 97.6 F (36.4 C) (Oral)   Resp 20   SpO2 96%   Physical Exam  Constitutional: She is oriented to person, place, and time. She appears well-developed and well-nourished. No distress.  HENT:  Head: Normocephalic and atraumatic.  Mouth/Throat: Oropharynx is clear and moist.  Eyes: Pupils are equal, round, and reactive to light. Conjunctivae and EOM are normal.  Neck: Neck supple. Spinous process tenderness and muscular tenderness present. Decreased range of motion present. No erythema present.    Cardiovascular: Normal rate, regular rhythm and intact distal pulses.  No murmur heard. Pulmonary/Chest: Effort normal and breath sounds normal. No respiratory distress. She has no wheezes. She has no rales.  Abdominal: There is no rebound.  Musculoskeletal: She exhibits no edema or tenderness.  Neurological: She is alert and oriented to person, place, and time.  5 out of 5 strength in hand grip bilaterally, 5 out of 5 strength of biceps and triceps bilaterally.  Normal gait.  5 out of 5 strength in the lower  extremities bilaterally  Skin: Skin is warm and dry. No rash  noted. No erythema.  Psychiatric: She has a normal mood and affect. Her behavior is normal.  Nursing note and vitals reviewed.    ED Treatments / Results  Labs (all labs ordered are listed, but only abnormal results are displayed) Labs Reviewed - No data to display  EKG None  Radiology No results found.  Procedures Procedures (including critical care time)  Medications Ordered in ED Medications  lidocaine-EPINEPHrine (XYLOCAINE W/EPI) 2 %-1:200000 (PF) injection 10 mL (10 mLs Infiltration Given 05/12/17 1650)  diazepam (VALIUM) tablet 5 mg (5 mg Oral Given 05/12/17 1650)     Initial Impression / Assessment and Plan / ED Course  I have reviewed the triage vital signs and the nursing notes.  Pertinent labs & imaging results that were available during my care of the patient were reviewed by me and considered in my medical decision making (see chart for details).    Patient presenting with worsening neck pain which seems most likely related to degenerative disc disease and muscle spasm.   She is recently seen a neurologist and had a CT done of her head as well as a CTA of her head and neck in the last 2 weeks which showed diffuse disease but no critical stenosis or vascular issues.  Patient had no signs of subdural hemorrhage from her fall.  She is neurovascularly intact.  Recommended not using NSAIDs due to her cardiac history but will do diclofenac gel will also try Valium and will do trigger injections with lidocaine here.   Final Clinical Impressions(s) / ED Diagnoses   Final diagnoses:  Neck pain    ED Discharge Orders        Ordered    diazepam (VALIUM) 5 MG tablet  Every 8 hours PRN     05/12/17 1722    diclofenac sodium (VOLTAREN) 1 % GEL  4 times daily     05/12/17 1722       Blanchie Dessert, MD 05/12/17 2344

## 2017-05-13 ENCOUNTER — Ambulatory Visit (HOSPITAL_COMMUNITY)
Admission: RE | Admit: 2017-05-13 | Discharge: 2017-05-13 | Disposition: A | Discharge status: Home / Self Care | Payer: Medicare HMO | Source: Ambulatory Visit | Attending: Surgery | Admitting: Surgery

## 2017-05-13 ENCOUNTER — Other Ambulatory Visit: Payer: Self-pay

## 2017-05-13 ENCOUNTER — Ambulatory Visit (HOSPITAL_COMMUNITY): Payer: Medicare HMO | Admitting: Anesthesiology

## 2017-05-13 ENCOUNTER — Ambulatory Visit (INDEPENDENT_AMBULATORY_CARE_PROVIDER_SITE_OTHER): Payer: Medicare HMO | Admitting: *Deleted

## 2017-05-13 ENCOUNTER — Encounter (HOSPITAL_COMMUNITY): Payer: Self-pay | Admitting: Emergency Medicine

## 2017-05-13 ENCOUNTER — Encounter (HOSPITAL_COMMUNITY)
Admission: RE | Disposition: A | Discharge status: Home / Self Care | Payer: Self-pay | Source: Ambulatory Visit | Attending: Surgery

## 2017-05-13 DIAGNOSIS — I509 Heart failure, unspecified: Secondary | ICD-10-CM | POA: Insufficient documentation

## 2017-05-13 DIAGNOSIS — Z9049 Acquired absence of other specified parts of digestive tract: Secondary | ICD-10-CM | POA: Insufficient documentation

## 2017-05-13 DIAGNOSIS — Z79890 Hormone replacement therapy (postmenopausal): Secondary | ICD-10-CM | POA: Insufficient documentation

## 2017-05-13 DIAGNOSIS — Z888 Allergy status to other drugs, medicaments and biological substances status: Secondary | ICD-10-CM | POA: Insufficient documentation

## 2017-05-13 DIAGNOSIS — D351 Benign neoplasm of parathyroid gland: Secondary | ICD-10-CM | POA: Diagnosis not present

## 2017-05-13 DIAGNOSIS — Z950 Presence of cardiac pacemaker: Secondary | ICD-10-CM | POA: Insufficient documentation

## 2017-05-13 DIAGNOSIS — E213 Hyperparathyroidism, unspecified: Secondary | ICD-10-CM

## 2017-05-13 DIAGNOSIS — Z87891 Personal history of nicotine dependence: Secondary | ICD-10-CM | POA: Diagnosis not present

## 2017-05-13 DIAGNOSIS — Z823 Family history of stroke: Secondary | ICD-10-CM | POA: Insufficient documentation

## 2017-05-13 DIAGNOSIS — Z8249 Family history of ischemic heart disease and other diseases of the circulatory system: Secondary | ICD-10-CM | POA: Diagnosis not present

## 2017-05-13 DIAGNOSIS — Z79899 Other long term (current) drug therapy: Secondary | ICD-10-CM | POA: Diagnosis not present

## 2017-05-13 DIAGNOSIS — I4891 Unspecified atrial fibrillation: Secondary | ICD-10-CM | POA: Diagnosis not present

## 2017-05-13 DIAGNOSIS — Z7901 Long term (current) use of anticoagulants: Secondary | ICD-10-CM | POA: Insufficient documentation

## 2017-05-13 DIAGNOSIS — I25810 Atherosclerosis of coronary artery bypass graft(s) without angina pectoris: Secondary | ICD-10-CM | POA: Diagnosis not present

## 2017-05-13 DIAGNOSIS — E210 Primary hyperparathyroidism: Secondary | ICD-10-CM | POA: Insufficient documentation

## 2017-05-13 DIAGNOSIS — I495 Sick sinus syndrome: Secondary | ICD-10-CM | POA: Diagnosis not present

## 2017-05-13 DIAGNOSIS — Z86711 Personal history of pulmonary embolism: Secondary | ICD-10-CM | POA: Insufficient documentation

## 2017-05-13 DIAGNOSIS — I110 Hypertensive heart disease with heart failure: Secondary | ICD-10-CM | POA: Insufficient documentation

## 2017-05-13 DIAGNOSIS — I739 Peripheral vascular disease, unspecified: Secondary | ICD-10-CM | POA: Insufficient documentation

## 2017-05-13 DIAGNOSIS — I252 Old myocardial infarction: Secondary | ICD-10-CM | POA: Insufficient documentation

## 2017-05-13 DIAGNOSIS — Z8349 Family history of other endocrine, nutritional and metabolic diseases: Secondary | ICD-10-CM | POA: Insufficient documentation

## 2017-05-13 DIAGNOSIS — E785 Hyperlipidemia, unspecified: Secondary | ICD-10-CM | POA: Diagnosis not present

## 2017-05-13 DIAGNOSIS — Z9981 Dependence on supplemental oxygen: Secondary | ICD-10-CM | POA: Insufficient documentation

## 2017-05-13 HISTORY — PX: PARATHYROIDECTOMY: SHX19

## 2017-05-13 SURGERY — PARATHYROIDECTOMY
Anesthesia: General | Site: Neck

## 2017-05-13 MED ORDER — BUPIVACAINE HCL 0.25 % IJ SOLN
INTRAMUSCULAR | Status: DC | PRN
  Administered 2017-05-13: 10 mL

## 2017-05-13 MED ORDER — FENTANYL CITRATE (PF) 100 MCG/2ML IJ SOLN
INTRAMUSCULAR | Status: DC | PRN
  Administered 2017-05-13 (×2): 50 ug via INTRAVENOUS

## 2017-05-13 MED ORDER — ROCURONIUM BROMIDE 10 MG/ML (PF) SYRINGE
PREFILLED_SYRINGE | INTRAVENOUS | Status: AC
  Filled 2017-05-13: qty 5

## 2017-05-13 MED ORDER — ROCURONIUM BROMIDE 50 MG/5ML IV SOSY
PREFILLED_SYRINGE | INTRAVENOUS | Status: DC | PRN
  Administered 2017-05-13: 30 mg via INTRAVENOUS

## 2017-05-13 MED ORDER — CHLORHEXIDINE GLUCONATE CLOTH 2 % EX PADS: 6.0000 | MEDICATED_PAD | Freq: Once | CUTANEOUS | Status: DC

## 2017-05-13 MED ORDER — BUPIVACAINE HCL (PF) 0.25 % IJ SOLN
INTRAMUSCULAR | Status: AC
  Filled 2017-05-13: qty 30

## 2017-05-13 MED ORDER — ONDANSETRON HCL 4 MG/2ML IJ SOLN
INTRAMUSCULAR | Status: DC | PRN
  Administered 2017-05-13: 4 mg via INTRAVENOUS

## 2017-05-13 MED ORDER — DEXAMETHASONE SODIUM PHOSPHATE 10 MG/ML IJ SOLN
INTRAMUSCULAR | Status: AC
  Filled 2017-05-13: qty 1

## 2017-05-13 MED ORDER — LIDOCAINE 2% (20 MG/ML) 5 ML SYRINGE
INTRAMUSCULAR | Status: AC
  Filled 2017-05-13: qty 5

## 2017-05-13 MED ORDER — TRAMADOL HCL 50 MG PO TABS: 50.0000 mg | ORAL_TABLET | Freq: Four times a day (QID) | ORAL | 0 refills | Status: DC | PRN

## 2017-05-13 MED ORDER — ACETAMINOPHEN 10 MG/ML IV SOLN
1000.0000 mg | Freq: Once | INTRAVENOUS | Status: DC | PRN
  Administered 2017-05-13: 1000 mg via INTRAVENOUS

## 2017-05-13 MED ORDER — ONDANSETRON HCL 4 MG/2ML IJ SOLN
INTRAMUSCULAR | Status: AC
  Filled 2017-05-13: qty 2

## 2017-05-13 MED ORDER — CEFAZOLIN SODIUM-DEXTROSE 2-4 GM/100ML-% IV SOLN
2.0000 g | INTRAVENOUS | Status: AC
  Administered 2017-05-13: 2 g via INTRAVENOUS
  Filled 2017-05-13: qty 100

## 2017-05-13 MED ORDER — FENTANYL CITRATE (PF) 250 MCG/5ML IJ SOLN
INTRAMUSCULAR | Status: AC
  Filled 2017-05-13: qty 5

## 2017-05-13 MED ORDER — SUCCINYLCHOLINE CHLORIDE 200 MG/10ML IV SOSY
PREFILLED_SYRINGE | INTRAVENOUS | Status: DC | PRN
  Administered 2017-05-13: 100 mg via INTRAVENOUS

## 2017-05-13 MED ORDER — DEXAMETHASONE SODIUM PHOSPHATE 10 MG/ML IJ SOLN
INTRAMUSCULAR | Status: DC | PRN
  Administered 2017-05-13: 10 mg via INTRAVENOUS

## 2017-05-13 MED ORDER — MEPERIDINE HCL 50 MG/ML IJ SOLN: 6.2500 mg | INTRAMUSCULAR | Status: DC | PRN

## 2017-05-13 MED ORDER — 0.9 % SODIUM CHLORIDE (POUR BTL) OPTIME
TOPICAL | Status: DC | PRN
  Administered 2017-05-13: 1000 mL

## 2017-05-13 MED ORDER — SUCCINYLCHOLINE CHLORIDE 200 MG/10ML IV SOSY
PREFILLED_SYRINGE | INTRAVENOUS | Status: AC
  Filled 2017-05-13: qty 10

## 2017-05-13 MED ORDER — LACTATED RINGERS IV SOLN
INTRAVENOUS | Status: DC
  Administered 2017-05-13: 10:00:00 via INTRAVENOUS

## 2017-05-13 MED ORDER — HYDROMORPHONE HCL 1 MG/ML IJ SOLN: 0.2500 mg | INTRAMUSCULAR | Status: DC | PRN

## 2017-05-13 MED ORDER — PROPOFOL 10 MG/ML IV BOLUS
INTRAVENOUS | Status: DC | PRN
  Administered 2017-05-13: 150 mg via INTRAVENOUS

## 2017-05-13 MED ORDER — PHENYLEPHRINE HCL 10 MG/ML IJ SOLN
INTRAMUSCULAR | Status: AC
  Filled 2017-05-13: qty 1

## 2017-05-13 MED ORDER — SUGAMMADEX SODIUM 200 MG/2ML IV SOLN
INTRAVENOUS | Status: DC | PRN
  Administered 2017-05-13: 200 mg via INTRAVENOUS

## 2017-05-13 MED ORDER — PROPOFOL 10 MG/ML IV BOLUS
INTRAVENOUS | Status: AC
  Filled 2017-05-13: qty 20

## 2017-05-13 MED ORDER — HYDROCODONE-ACETAMINOPHEN 7.5-325 MG PO TABS
1.0000 | ORAL_TABLET | Freq: Once | ORAL | Status: DC | PRN
Start: 2017-05-13 — End: 2017-05-14

## 2017-05-13 MED ORDER — PHENYLEPHRINE HCL 10 MG/ML IJ SOLN
INTRAMUSCULAR | Status: DC | PRN
  Administered 2017-05-13 (×2): 80 ug via INTRAVENOUS

## 2017-05-13 MED ORDER — ACETAMINOPHEN 10 MG/ML IV SOLN
INTRAVENOUS | Status: AC
  Administered 2017-05-13: 1000 mg via INTRAVENOUS
  Filled 2017-05-13: qty 100

## 2017-05-13 MED ORDER — PHENYLEPHRINE HCL 10 MG/ML IJ SOLN
INTRAMUSCULAR | Status: DC | PRN
  Administered 2017-05-13: 25 ug/min via INTRAVENOUS

## 2017-05-13 MED ORDER — LIDOCAINE 2% (20 MG/ML) 5 ML SYRINGE
INTRAMUSCULAR | Status: DC | PRN
  Administered 2017-05-13: 50 mg via INTRAVENOUS

## 2017-05-13 MED ORDER — PROMETHAZINE HCL 25 MG/ML IJ SOLN: 6.2500 mg | INTRAMUSCULAR | Status: DC | PRN

## 2017-05-13 SURGICAL SUPPLY — 37 items
ATTRACTOMAT 16X20 MAGNETIC DRP (DRAPES) ×2 IMPLANT
BLADE HEX COATED 2.75 (ELECTRODE) ×2 IMPLANT
BLADE SURG 15 STRL LF DISP TIS (BLADE) ×1 IMPLANT
BLADE SURG 15 STRL SS (BLADE) ×1
CHLORAPREP W/TINT 26ML (MISCELLANEOUS) ×4 IMPLANT
CLIP VESOCCLUDE MED 6/CT (CLIP) ×4 IMPLANT
CLIP VESOCCLUDE SM WIDE 6/CT (CLIP) ×4 IMPLANT
DERMABOND ADVANCED (GAUZE/BANDAGES/DRESSINGS)
DERMABOND ADVANCED .7 DNX12 (GAUZE/BANDAGES/DRESSINGS) IMPLANT
DRAPE LAPAROTOMY T 98X78 PEDS (DRAPES) ×2 IMPLANT
ELECT PENCIL ROCKER SW 15FT (MISCELLANEOUS) ×2 IMPLANT
ELECT REM PT RETURN 15FT ADLT (MISCELLANEOUS) ×2 IMPLANT
GAUZE SPONGE 4X4 12PLY STRL (GAUZE/BANDAGES/DRESSINGS) ×2 IMPLANT
GAUZE SPONGE 4X4 16PLY XRAY LF (GAUZE/BANDAGES/DRESSINGS) ×2 IMPLANT
GLOVE SURG ORTHO 8.0 STRL STRW (GLOVE) ×2 IMPLANT
GOWN STRL REUS W/TWL XL LVL3 (GOWN DISPOSABLE) ×6 IMPLANT
HEMOSTAT SURGICEL 2X4 FIBR (HEMOSTASIS) ×2 IMPLANT
ILLUMINATOR WAVEGUIDE N/F (MISCELLANEOUS) IMPLANT
KIT BASIN OR (CUSTOM PROCEDURE TRAY) ×2 IMPLANT
LIGHT WAVEGUIDE WIDE FLAT (MISCELLANEOUS) IMPLANT
NEEDLE HYPO 25X1 1.5 SAFETY (NEEDLE) ×2 IMPLANT
PACK BASIC VI WITH GOWN DISP (CUSTOM PROCEDURE TRAY) ×2 IMPLANT
POWDER SURGICEL 3.0 GRAM (HEMOSTASIS) IMPLANT
STAPLER VISISTAT 35W (STAPLE) ×2 IMPLANT
STRIP CLOSURE SKIN 1/2X4 (GAUZE/BANDAGES/DRESSINGS) ×2 IMPLANT
SUT MNCRL AB 4-0 PS2 18 (SUTURE) ×2 IMPLANT
SUT SILK 2 0 (SUTURE)
SUT SILK 2-0 18XBRD TIE 12 (SUTURE) IMPLANT
SUT SILK 3 0 (SUTURE)
SUT SILK 3-0 18XBRD TIE 12 (SUTURE) IMPLANT
SUT VIC AB 3-0 SH 18 (SUTURE) ×2 IMPLANT
SYR BULB IRRIGATION 50ML (SYRINGE) ×2 IMPLANT
SYR CONTROL 10ML LL (SYRINGE) ×2 IMPLANT
TAPE CLOTH SURG 4X10 WHT LF (GAUZE/BANDAGES/DRESSINGS) ×2 IMPLANT
TOWEL OR 17X26 10 PK STRL BLUE (TOWEL DISPOSABLE) ×2 IMPLANT
TOWEL OR NON WOVEN STRL DISP B (DISPOSABLE) ×2 IMPLANT
YANKAUER SUCT BULB TIP 10FT TU (MISCELLANEOUS) ×2 IMPLANT

## 2017-05-13 NOTE — Transfer of Care (Signed)
Immediate Anesthesia Transfer of Care Note  Patient: [name]  Procedure(s) Performed: PARATHYROIDECTOMY (N/A Neck)  Patient Location: PACU  Anesthesia Type:General  Level of Consciousness: sedated  Airway & Oxygen Therapy: Patient Spontanous Breathing and Patient connected to face mask oxygen  Post-op Assessment: Report given to RN and Post -op Vital signs reviewed and stable  Post vital signs: Reviewed and stable  Last Vitals:  Vitals Value Taken Time  BP    Temp    Pulse 79 05/13/2017  2:53 PM  Resp 17 05/13/2017  2:53 PM  SpO2 100 % 05/13/2017  2:53 PM  Vitals shown include unvalidated device data.  Last Pain:  Vitals:   05/13/17 1006  TempSrc:   PainSc: 7       Patients Stated Pain Goal: 4 (17/91/50 5697)  Complications: No apparent anesthesia complications

## 2017-05-13 NOTE — Progress Notes (Signed)
Remote pacemaker transmission.   

## 2017-05-13 NOTE — Anesthesia Postprocedure Evaluation (Signed)
Anesthesia Post Note  Patient: [name]  Procedure(s) Performed: PARATHYROIDECTOMY (N/A Neck)     Patient location during evaluation: PACU Anesthesia Type: General Level of consciousness: awake and alert Pain management: pain level controlled Vital Signs Assessment: post-procedure vital signs reviewed and stable Respiratory status: spontaneous breathing, nonlabored ventilation, respiratory function stable and patient connected to nasal cannula oxygen Cardiovascular status: blood pressure returned to baseline and stable Postop Assessment: no apparent nausea or vomiting Anesthetic complications: no    Last Vitals:  Vitals:   05/13/17 1515 05/13/17 1530  BP: (!) 120/97 (!) 152/94  Pulse: 74 75  Resp: 18 17  Temp:    SpO2: 100% 100%    Last Pain:  Vitals:   05/13/17 1450  TempSrc:   PainSc: 0-No pain                 Barnet Glasgow

## 2017-05-13 NOTE — Anesthesia Procedure Notes (Signed)
Procedure Name: Intubation Date/Time: 05/13/2017 1:55 PM Performed by: Maxwell Caul, CRNA Pre-anesthesia Checklist: Patient identified, Emergency Drugs available, Suction available and Patient being monitored Patient Re-evaluated:Patient Re-evaluated prior to induction Oxygen Delivery Method: Circle system utilized Preoxygenation: Pre-oxygenation with 100% oxygen Induction Type: IV induction Ventilation: Mask ventilation without difficulty Laryngoscope Size: Glidescope and 4 Grade View: Grade I Tube type: Oral Tube size: 7.5 mm Number of attempts: 1 Airway Equipment and Method: Stylet Placement Confirmation: ETT inserted through vocal cords under direct vision,  positive ETCO2 and breath sounds checked- equal and bilateral Secured at: 21 cm Tube secured with: Tape Dental Injury: Teeth and Oropharynx as per pre-operative assessment  Difficulty Due To: Difficulty was anticipated and Difficult Airway- due to reduced neck mobility Comments: Elective Glidescope due to limited neck mobility. Neck maintained in neutral position during intubation. Dr Valma Cava and Dr Harlow Asa at bedside during intubation.

## 2017-05-13 NOTE — Op Note (Signed)
OPERATIVE REPORT - PARATHYROIDECTOMY  Preoperative diagnosis: Primary hyperparathyroidism  Postop diagnosis: Same  Procedure: Left inferior minimally invasive parathyroidectomy  Surgeon:  Armandina Gemma, MD  Anesthesia: General endotracheal  Estimated blood loss: Minimal  Preparation: ChloraPrep  Indications: Patient is referred by Dr. Jacelyn Pi for surgical evaluation and management of suspected primary hyperparathyroidism. Patient's primary care physician is Dr. Pamella Pert at [facility], and her neurologist is Dr. Floyde Parkins. Patient has been noted for years to have an elevated serum calcium level. Most recent level is 10.3. Additional laboratory testing showed an elevated intact PTH level of 145. 25-hydroxy vitamin D level is normal at 59. Patient has had complications including recent rib fractures after a fall and osteopenia.  Nuclear med parathyroid scan localized an adenoma to the left inferior position.  Procedure: The patient was prepared in the pre-operative holding area. The patient was brought to the operating room and placed in a supine position on the operating room table. Following administration of general anesthesia, the patient was positioned and then prepped and draped in the usual strict aseptic fashion. After ascertaining that an adequate level of anesthesia been achieved, a neck incision was made with a #15 blade. Dissection was carried through subcutaneous tissues and platysma. Hemostasis was obtained with the electrocautery. Skin flaps were developed circumferentially and a Weitlander retractor was placed for exposure.  Strap muscles were incised in the midline. Strap muscles were reflected lateralley exposing the thyroid lobe. With gentle blunt dissection the thyroid lobe was mobilized.  Dissection was carried through adipose tissue and an enlarged parathyroid gland was identified. It was gently mobilized. Vascular structures were divided between small and medium  ligaclips. Care was taken to avoid the recurrent laryngeal nerve and the esophagus. The parathyroid gland was completely excised. It was submitted to pathology where frozen section confirmed parathyroid tissue consistent with adenoma.  Neck was irrigated with warm saline and good hemostasis was noted. Fibrillar was placed in the operative field. Strap muscles were reapproximated in the midline with interrupted 3-0 Vicryl sutures. Platysma was closed with interrupted 3-0 Vicryl sutures. Skin was closed with a running 4-0 Monocryl subcuticular suture. Marcaine was infiltrated circumferentially. Wound was washed and dried and Steristrips were applied. Patient was awakened from anesthesia and brought to the recovery room. The patient tolerated the procedure well.   Armandina Gemma, MD [facility] Office: [phone]

## 2017-05-13 NOTE — Interval H&P Note (Signed)
History and Physical Interval Note:  05/13/2017 1:29 PM  [name]  has presented today for surgery, with the diagnosis of PRIMARY HYPERPARATHYROIDISM.  The various methods of treatment have been discussed with the patient and family. After consideration of risks, benefits and other options for treatment, the patient has consented to    Procedure(s): PARATHYROIDECTOMY (N/A) as a surgical intervention .    The patient's history has been reviewed, patient examined, no change in status, stable for surgery.  I have reviewed the patient's chart and labs.  Questions were answered to the patient's satisfaction.    Armandina Gemma, [facility] Office: [place]

## 2017-05-13 NOTE — Anesthesia Preprocedure Evaluation (Addendum)
Anesthesia Evaluation  Patient identified by MRN, date of birth, ID band Patient awake    Reviewed: Allergy & Precautions, NPO status , Patient's Chart, lab work & pertinent test results  Airway Mallampati: II  TM Distance: >3 FB Neck ROM: Full    Dental no notable dental hx.    Pulmonary former smoker,    Pulmonary exam normal breath sounds clear to auscultation       Cardiovascular Exercise Tolerance: Good hypertension, Pt. on medications + CAD, + Past MI, + Peripheral Vascular Disease and +CHF  Normal cardiovascular exam+ pacemaker  Rhythm:Regular Rate:Normal     Neuro/Psych Anxiety CVA    GI/Hepatic negative GI ROS, Neg liver ROS,   Endo/Other  negative endocrine ROS  Renal/GU CRFRenal disease     Musculoskeletal  (+) Arthritis ,   Abdominal   Peds  Hematology  (+) anemia ,   Anesthesia Other Findings   Reproductive/Obstetrics                           Lab Results  Component Value Date   WBC 6.5 05/10/2017   HGB 15.1 (H) 05/10/2017   HCT 44.5 05/10/2017   MCV 90.8 05/10/2017   PLT 250 05/10/2017   Lab Results  Component Value Date   CREATININE 0.92 05/10/2017   BUN 21 (H) 05/10/2017   NA 137 05/10/2017   K 4.6 05/10/2017   CL 101 05/10/2017   CO2 28 05/10/2017     Anesthesia Physical Anesthesia Plan  ASA: III  Anesthesia Plan: General   Post-op Pain Management:    Induction: Intravenous  PONV Risk Score and Plan: Treatment may vary due to age or medical condition, Ondansetron, Dexamethasone and Midazolam  Airway Management Planned: Oral ETT  Additional Equipment:   Intra-op Plan:   Post-operative Plan:   Informed Consent: I have reviewed the patients History and Physical, chart, labs and discussed the procedure including the risks, benefits and alternatives for the proposed anesthesia with the patient or authorized representative who has indicated his/her  understanding and acceptance.   Dental advisory given  Plan Discussed with: CRNA  Anesthesia Plan Comments:         Anesthesia Quick Evaluation

## 2017-05-14 ENCOUNTER — Encounter (HOSPITAL_COMMUNITY): Payer: Self-pay | Admitting: Surgery

## 2017-05-14 NOTE — Addendum Note (Signed)
Addendum  created 05/14/17 0630 by Lollie Sails, CRNA   Charge Capture section accepted

## 2017-05-16 NOTE — Progress Notes (Signed)
Please contact patient and notify of benign pathology results.  Adaira Centola M. Corey Laski, MD, FACS [facility] Office: [phone]   

## 2017-05-17 ENCOUNTER — Telehealth: Payer: Self-pay | Admitting: Neurology

## 2017-05-17 DIAGNOSIS — M542 Cervicalgia: Secondary | ICD-10-CM

## 2017-05-17 MED ORDER — GABAPENTIN 100 MG PO CAPS: 100.0000 mg | ORAL_CAPSULE | Freq: Three times a day (TID) | ORAL | 3 refills | Status: DC

## 2017-05-17 NOTE — Telephone Encounter (Signed)
[employer] medicare order sent to [facility] they obtain the auth and will reach out to the pt to schedule.

## 2017-05-17 NOTE — Telephone Encounter (Signed)
Pt called stating she is having intense pain throughout her neck and back of her head, Stating she fell in January and broke a few ribs stating she thinks this has been the cause. Pt has tired PT, over the counter medications stating nothing gives her relief

## 2017-05-17 NOTE — Telephone Encounter (Signed)
I called the patient.  The patient has had some neck pain since her falls over the last 2 months.  The patient has headaches coming up from the neck, she has pain going down into the shoulders.  The pain is slowly getting worse.  She has done some therapy with Mr. Lieutenant Diego with out much benefit, she believes that this therapy made her worse.  The patient will get a CT scan of the cervical spine, I will start gabapentin in low-dose.  We may get neuromuscular therapy in the future.  EEG is pending, is scheduled for 24 May 2017.

## 2017-05-19 ENCOUNTER — Encounter: Payer: Self-pay | Admitting: Cardiology

## 2017-05-21 ENCOUNTER — Other Ambulatory Visit: Payer: Medicare HMO

## 2017-05-21 ENCOUNTER — Telehealth: Payer: Self-pay | Admitting: Neurology

## 2017-05-21 DIAGNOSIS — M542 Cervicalgia: Secondary | ICD-10-CM

## 2017-05-21 NOTE — Telephone Encounter (Signed)
[employer] has not approved or denied the CT Cervical yet but they are pending outreach.. I called [employer] to see what kind of options there are. They do have all the clinicals notes and the message where she has the neck pain as well. They are stating that a Xray of the cervical spine be done first and see what the impression are from the Xray. If you would like to do a peer to peer the phone number is [phone] and the case # is [id number].

## 2017-05-21 NOTE — Telephone Encounter (Signed)
I called the patient.  I indicated that a cervical spine x-ray is required before the CT.  I will order the study.

## 2017-05-21 NOTE — Telephone Encounter (Signed)
Noted, I spoke to [name] at [facility] and informed her that the patient is going to come in first to have a Xray per her insurance before they approve a CT. The patient is aware that she can go into [facility] at any time between 8-4:30 pm to have her Xray done.

## 2017-05-24 ENCOUNTER — Ambulatory Visit (INDEPENDENT_AMBULATORY_CARE_PROVIDER_SITE_OTHER): Payer: Medicare HMO | Admitting: Neurology

## 2017-05-24 ENCOUNTER — Telehealth: Payer: Self-pay | Admitting: Neurology

## 2017-05-24 ENCOUNTER — Ambulatory Visit
Admission: RE | Admit: 2017-05-24 | Discharge: 2017-05-24 | Disposition: A | Discharge status: Home / Self Care | Payer: Medicare HMO | Source: Ambulatory Visit | Attending: Neurology | Admitting: Neurology

## 2017-05-24 DIAGNOSIS — M542 Cervicalgia: Secondary | ICD-10-CM

## 2017-05-24 DIAGNOSIS — R531 Weakness: Secondary | ICD-10-CM

## 2017-05-24 DIAGNOSIS — R4701 Aphasia: Secondary | ICD-10-CM | POA: Diagnosis not present

## 2017-05-24 MED ORDER — PREDNISONE 10 MG PO TABS: ORAL_TABLET | ORAL | 0 refills | Status: DC

## 2017-05-24 MED ORDER — METHOCARBAMOL 500 MG PO TABS: 500.0000 mg | ORAL_TABLET | Freq: Three times a day (TID) | ORAL | 1 refills | Status: DC

## 2017-05-24 NOTE — Telephone Encounter (Signed)
Patient had an x-ray of her neck this morning and says the pain in her neck is unbearable. gabapentin (NEURONTIN) 100 MG capsule is not helping with the pain and she is very upset. Please call and discuss. She uses [facility].

## 2017-05-24 NOTE — Procedures (Signed)
    History:  [name] is a 77 year old patient with a history of cerebrovascular disease.  The patient is being worked up for a TIA type event that occurred on 17 March 2017 associated with left arm weakness and aphasia.  The patient had symptoms for about 1 hour which then cleared up.  This is a routine EEG.  No skull defects are noted.  Medications include Fosamax, Coreg, vitamin D, Celexa, Plavix, Tikosyn, Synthroid, magnesium supplementation, multivitamins, nitroglycerin, and fish oil.  EEG classification: Normal awake  Description of the recording: The background rhythms of this recording consists of a fairly well modulated medium amplitude alpha rhythm of 10 Hz that is reactive to eye opening and closure. As the record progresses, the patient appears to remain in the waking state throughout the recording. Photic stimulation was performed, resulting in a bilateral and symmetric photic driving response. Hyperventilation was not performed. At no time during the recording does there appear to be evidence of spike or spike wave discharges or evidence of focal slowing. EKG monitor shows no evidence of cardiac rhythm abnormalities with a heart rate of 78.  Impression: This is a normal EEG recording in the waking state. No evidence of ictal or interictal discharges are seen.

## 2017-05-24 NOTE — Telephone Encounter (Signed)
I called the patient.  The patient is having severe discomfort in the neck, spasm.  X-ray of the cervical spine shows degenerative changes at multiple levels.  We will get CT scan of the cervical spine.  I will get her started on a prednisone Dosepak, and methocarbamol.  CT of the cervical spine was reordered.

## 2017-05-24 NOTE — Addendum Note (Signed)
Addended by: Kathrynn Ducking on: 05/24/2017 05:09 PM   Modules accepted: Orders

## 2017-05-24 NOTE — Telephone Encounter (Signed)
  I called the patient.  The x-ray of the cervical spine shows degenerative changes at the C4-5, C5-6, and C6-7 levels.  We have ordered a CT scan of the cervical spine, insurance has required that an x-ray be done first.  Cervical XR 05/24/17:  IMPRESSION: No visible cervical spine fracture or traumatic subluxation. If further investigation desired, with regard to concern for cervical spine injury, CT of the cervical spine without contrast recommended.

## 2017-05-25 NOTE — Telephone Encounter (Signed)
I resent the CT order to [facility]. They will contact the pt to schedule and redo the auth with [facility].

## 2017-06-01 LAB — CUP PACEART REMOTE DEVICE CHECK
Battery Impedance: 350 Ohm
Battery Remaining Longevity: 92 mo
Battery Voltage: 2.78 V
Brady Statistic AP VP Percent: 1 %
Brady Statistic AP VS Percent: 98 %
Brady Statistic AS VP Percent: 0 %
Brady Statistic AS VS Percent: 2 %
Date Time Interrogation Session: 20190403121623
Implantable Lead Implant Date: 20131212
Implantable Lead Implant Date: 20131212
Implantable Lead Location: 753859
Implantable Lead Location: 753860
Implantable Lead Model: 5076
Implantable Lead Model: 5076
Implantable Pulse Generator Implant Date: 20131212
Lead Channel Impedance Value: 403 Ohm
Lead Channel Impedance Value: 467 Ohm
Lead Channel Pacing Threshold Amplitude: 0.625 V
Lead Channel Pacing Threshold Amplitude: 0.875 V
Lead Channel Pacing Threshold Pulse Width: 0.4 ms
Lead Channel Pacing Threshold Pulse Width: 0.4 ms
Lead Channel Setting Pacing Amplitude: 2 V
Lead Channel Setting Pacing Amplitude: 2.5 V
Lead Channel Setting Pacing Pulse Width: 0.4 ms
Lead Channel Setting Sensing Sensitivity: 5.6 mV

## 2017-06-17 ENCOUNTER — Other Ambulatory Visit: Payer: Medicare HMO

## 2017-06-21 NOTE — Telephone Encounter (Signed)
Just an Corky Mull with the appeal time with [employer] called me and informed me that it can take up to 30 days to hear if it was approved or not. She gave me her number of [phone] to check the status on it.

## 2017-06-21 NOTE — Telephone Encounter (Signed)
The CT Cervical has been denied.Marland Kitchenand it is too late to start a new case. A new case cannot be started until around June 9. I faxed information to appeal the case of [id number] with [facility].

## 2017-06-22 NOTE — Telephone Encounter (Signed)
The appeal was approved. [employer] Medicare: [id number] (exp. 06/21/17 to 09/21/17) order resent to [facility].

## 2017-07-02 ENCOUNTER — Ambulatory Visit
Admission: RE | Admit: 2017-07-02 | Discharge: 2017-07-02 | Disposition: A | Discharge status: Home / Self Care | Payer: Medicare HMO | Source: Ambulatory Visit | Attending: Neurology | Admitting: Neurology

## 2017-07-02 DIAGNOSIS — M542 Cervicalgia: Secondary | ICD-10-CM

## 2017-07-02 DIAGNOSIS — M4802 Spinal stenosis, cervical region: Secondary | ICD-10-CM | POA: Diagnosis not present

## 2017-07-05 ENCOUNTER — Telehealth: Payer: Self-pay | Admitting: Neurology

## 2017-07-05 NOTE — Telephone Encounter (Signed)
I called the patient.  CT scan of the cervical spine does not show severe spinal stenosis that would impact balance.  The patient has not had any further falls.  No fracture associated with falls and injury to the neck.   CT cervical 07/02/17:  IMPRESSION: 1. Multilevel degenerative changes of the cervical spine as described above. Mild central spinal canal stenosis from C4-C7. Moderate neuroforaminal stenosis bilaterally at C4-C5 and on the right at C5-C6.

## 2017-07-14 ENCOUNTER — Ambulatory Visit: Payer: Medicare HMO | Admitting: Adult Health

## 2017-08-02 DIAGNOSIS — E210 Primary hyperparathyroidism: Secondary | ICD-10-CM | POA: Diagnosis not present

## 2017-08-13 ENCOUNTER — Telehealth: Payer: Self-pay | Admitting: Cardiology

## 2017-08-13 ENCOUNTER — Encounter: Payer: Medicare HMO | Admitting: *Deleted

## 2017-08-13 NOTE — Telephone Encounter (Signed)
Spoke with pt and reminded pt of remote transmission that is due today. Pt verbalized understanding.   

## 2017-08-17 ENCOUNTER — Other Ambulatory Visit: Payer: Self-pay

## 2017-08-18 ENCOUNTER — Encounter: Payer: Self-pay | Admitting: Cardiology

## 2017-08-20 ENCOUNTER — Telehealth: Payer: Self-pay | Admitting: Cardiovascular Disease

## 2017-08-20 NOTE — Telephone Encounter (Signed)
LMTCB

## 2017-08-20 NOTE — Telephone Encounter (Signed)
Pt c/o BP issue:  1. What are your last 5 BP readings? Doesn't have them  2. Are you having any other symptoms (ex. Dizziness, headache, blurred vision, passed out)? no 3. What is your medication issue? None  Pt wants appt with Dr. Loletha Grayer to discuss BP issue-pls call [phone]

## 2017-08-25 ENCOUNTER — Ambulatory Visit (INDEPENDENT_AMBULATORY_CARE_PROVIDER_SITE_OTHER): Payer: Medicare HMO | Admitting: *Deleted

## 2017-08-25 DIAGNOSIS — I495 Sick sinus syndrome: Secondary | ICD-10-CM

## 2017-08-25 NOTE — Progress Notes (Signed)
Remote pacemaker transmission.   

## 2017-08-25 NOTE — Telephone Encounter (Signed)
Left message for pt to call.

## 2017-08-26 ENCOUNTER — Encounter: Payer: Self-pay | Admitting: Cardiology

## 2017-08-27 ENCOUNTER — Telehealth: Payer: Self-pay | Admitting: Cardiovascular Disease

## 2017-08-27 NOTE — Telephone Encounter (Signed)
Left message to call back  

## 2017-08-27 NOTE — Telephone Encounter (Signed)
New Message       Pt c/o Shortness Of Breath: STAT if SOB developed within the last 24 hours or pt is noticeably SOB on the phone  1. Are you currently SOB (can you hear that pt is SOB on the phone)? Yes  2. How long have you been experiencing SOB? Over a month  3. Are you SOB when sitting or when up moving around? Both   4. Are you currently experiencing any other symptoms? Same weakness   Patient is scheduled for 10/22/17

## 2017-08-27 NOTE — Telephone Encounter (Signed)
Patient called in with additional concerns. This encounter will be closed.

## 2017-08-31 NOTE — Telephone Encounter (Signed)
Left message for pt to call.

## 2017-09-01 NOTE — Telephone Encounter (Signed)
Several messages have been left. Will await return call from pt ./cy 

## 2017-09-02 LAB — CUP PACEART REMOTE DEVICE CHECK
Battery Impedance: 400 Ohm
Battery Remaining Longevity: 87 mo
Battery Voltage: 2.78 V
Brady Statistic AP VP Percent: 1 %
Brady Statistic AP VS Percent: 98 %
Brady Statistic AS VP Percent: 0 %
Brady Statistic AS VS Percent: 1 %
Date Time Interrogation Session: 20190717145757
Implantable Lead Implant Date: 20131212
Implantable Lead Implant Date: 20131212
Implantable Lead Location: 753859
Implantable Lead Location: 753860
Implantable Lead Model: 5076
Implantable Lead Model: 5076
Implantable Pulse Generator Implant Date: 20131212
Lead Channel Impedance Value: 392 Ohm
Lead Channel Impedance Value: 452 Ohm
Lead Channel Pacing Threshold Amplitude: 0.625 V
Lead Channel Pacing Threshold Amplitude: 0.875 V
Lead Channel Pacing Threshold Pulse Width: 0.4 ms
Lead Channel Pacing Threshold Pulse Width: 0.4 ms
Lead Channel Setting Pacing Amplitude: 2 V
Lead Channel Setting Pacing Amplitude: 2.5 V
Lead Channel Setting Pacing Pulse Width: 0.4 ms
Lead Channel Setting Sensing Sensitivity: 5.6 mV

## 2017-09-06 ENCOUNTER — Telehealth: Payer: Self-pay | Admitting: Cardiovascular Disease

## 2017-09-06 NOTE — Telephone Encounter (Signed)
New message   Pt c/o BP issue: STAT if pt c/o blurred vision, one-sided weakness or slurred speech  1. What are your last 5 BP readings? 164/104 182/111 172/1112 206/128 204/121 206/126  2. Are you having any other symptoms (ex. Dizziness, headache, blurred vision, passed out)?  No   3. What is your BP issue? bp is elevated   Pt c/o Shortness Of Breath: STAT if SOB developed within the last 24 hours or pt is noticeably SOB on the phone  1. Are you currently SOB (can you hear that pt is SOB on the phone)? Yes (did not notice sob on phone)  2. How long have you been experiencing SOB? 2 to 3 mths  3. Are you SOB when sitting or when up moving around? Sob when standing   4. Are you currently experiencing any other symptoms? No

## 2017-09-06 NOTE — Telephone Encounter (Signed)
Called twice and went straight to voicemail, left message to call back

## 2017-09-08 NOTE — Telephone Encounter (Signed)
Left message to call back  

## 2017-09-13 DIAGNOSIS — I739 Peripheral vascular disease, unspecified: Secondary | ICD-10-CM | POA: Diagnosis not present

## 2017-09-13 DIAGNOSIS — Z950 Presence of cardiac pacemaker: Secondary | ICD-10-CM | POA: Diagnosis not present

## 2017-09-13 DIAGNOSIS — Z951 Presence of aortocoronary bypass graft: Secondary | ICD-10-CM | POA: Diagnosis not present

## 2017-09-13 DIAGNOSIS — I5030 Unspecified diastolic (congestive) heart failure: Secondary | ICD-10-CM | POA: Diagnosis not present

## 2017-09-13 DIAGNOSIS — N183 Chronic kidney disease, stage 3 (moderate): Secondary | ICD-10-CM | POA: Diagnosis not present

## 2017-09-13 DIAGNOSIS — I130 Hypertensive heart and chronic kidney disease with heart failure and stage 1 through stage 4 chronic kidney disease, or unspecified chronic kidney disease: Secondary | ICD-10-CM | POA: Diagnosis not present

## 2017-09-13 DIAGNOSIS — I252 Old myocardial infarction: Secondary | ICD-10-CM | POA: Diagnosis not present

## 2017-09-13 DIAGNOSIS — I10 Essential (primary) hypertension: Secondary | ICD-10-CM | POA: Diagnosis not present

## 2017-09-13 DIAGNOSIS — I4891 Unspecified atrial fibrillation: Secondary | ICD-10-CM | POA: Diagnosis not present

## 2017-09-13 DIAGNOSIS — I4892 Unspecified atrial flutter: Secondary | ICD-10-CM | POA: Diagnosis not present

## 2017-09-13 DIAGNOSIS — I25709 Atherosclerosis of coronary artery bypass graft(s), unspecified, with unspecified angina pectoris: Secondary | ICD-10-CM | POA: Diagnosis not present

## 2017-09-13 DIAGNOSIS — I2510 Atherosclerotic heart disease of native coronary artery without angina pectoris: Secondary | ICD-10-CM | POA: Diagnosis not present

## 2017-09-14 ENCOUNTER — Other Ambulatory Visit: Payer: Self-pay | Admitting: *Deleted

## 2017-09-14 ENCOUNTER — Telehealth: Payer: Self-pay | Admitting: Cardiovascular Disease

## 2017-09-14 MED ORDER — CLOPIDOGREL BISULFATE 75 MG PO TABS: ORAL_TABLET | ORAL | 0 refills | Status: DC

## 2017-09-14 NOTE — Telephone Encounter (Signed)
Rx sent to pharmacy   

## 2017-09-14 NOTE — Telephone Encounter (Signed)
Received records from [facility] on 09/14/17, Appt 10/22/17 @ 8:20AM.[place]

## 2017-09-16 DIAGNOSIS — M79641 Pain in right hand: Secondary | ICD-10-CM | POA: Diagnosis not present

## 2017-09-16 DIAGNOSIS — M13842 Other specified arthritis, left hand: Secondary | ICD-10-CM | POA: Diagnosis not present

## 2017-09-16 DIAGNOSIS — G5602 Carpal tunnel syndrome, left upper limb: Secondary | ICD-10-CM | POA: Diagnosis not present

## 2017-09-27 ENCOUNTER — Encounter (HOSPITAL_COMMUNITY): Payer: Medicare HMO

## 2017-09-30 ENCOUNTER — Telehealth: Payer: Self-pay | Admitting: Family Medicine

## 2017-09-30 NOTE — Telephone Encounter (Signed)
Copied from [place] [id number]. Topic: Quick Communication - Rx Refill/Question >> Sep 30, 2017  2:14 PM Gardiner Ramus wrote: Medication:citalopram (CELEXA) 20 MG tablet [[id number]]   Has the patient contacted their pharmacy? No Preferred Pharmacy (with phone number or street name): [id number] [place], [address] [place] [phone] (Phone) [phone] (Fax)    Agent: Please be advised that RX refills may take up to 3 business days. We ask that you follow-up with your pharmacy.

## 2017-10-01 NOTE — Telephone Encounter (Signed)
Please schedule patient to discuss celexa, thanks

## 2017-10-01 NOTE — Telephone Encounter (Signed)
Please advise as this medication has been filled since 2017.

## 2017-10-01 NOTE — Telephone Encounter (Signed)
mychart message sent to pt about making an apt °

## 2017-10-11 ENCOUNTER — Encounter: Payer: Self-pay | Admitting: Family Medicine

## 2017-10-12 ENCOUNTER — Telehealth: Payer: Self-pay | Admitting: Cardiovascular Disease

## 2017-10-12 NOTE — Telephone Encounter (Signed)
New message:       *STAT* If patient is at the pharmacy, call can be transferred to refill team.   1. Which medications need to be refilled? (please list name of each medication and dose if known) citalopram (CELEXA) 20 MG tablet  2. Which pharmacy/location (including street and city if local pharmacy) is medication to be sent to?Take 0.5 tablets (10 mg total) by mouth daily.  3. Do they need a 30 day or 90 day supply? [place]

## 2017-10-12 NOTE — Telephone Encounter (Signed)
Yes please, but ask her to refer future refills to PCP [facility]

## 2017-10-13 ENCOUNTER — Other Ambulatory Visit: Payer: Self-pay

## 2017-10-13 MED ORDER — CITALOPRAM HYDROBROMIDE 20 MG PO TABS: 10.0000 mg | ORAL_TABLET | Freq: Every day | ORAL | 6 refills | Status: DC

## 2017-10-13 NOTE — Telephone Encounter (Signed)
Left detailed message stating that a refill has been sent to pharmacy and to call if she has any additional questions.

## 2017-10-15 ENCOUNTER — Telehealth: Payer: Self-pay | Admitting: Cardiovascular Disease

## 2017-10-15 NOTE — Telephone Encounter (Signed)
New Message     *STAT* If patient is at the pharmacy, call can be transferred to refill team.   1. Which medications need to be refilled? (please list name of each medication and dose if known) citalopram (CELEXA) 20 MG tablet   Take 0.5 tablets (10 mg total) by mouth daily.  2. Which pharmacy/location (including street and city if local pharmacy) is medication to be sent to? [id number] [place], [place]  3. Do they need a 30 day or 90 day supply? 90 day    [facility] to [facility]

## 2017-10-15 NOTE — Telephone Encounter (Signed)
Please sort out [facility]

## 2017-10-17 ENCOUNTER — Other Ambulatory Visit: Payer: Self-pay | Admitting: Cardiovascular Disease

## 2017-10-19 MED ORDER — CITALOPRAM HYDROBROMIDE 20 MG PO TABS: 10.0000 mg | ORAL_TABLET | Freq: Every day | ORAL | 0 refills | Status: DC

## 2017-10-19 NOTE — Telephone Encounter (Signed)
Rx(s) sent to pharmacy electronically.  

## 2017-10-22 ENCOUNTER — Ambulatory Visit: Payer: Medicare HMO | Admitting: Cardiovascular Disease

## 2017-10-22 ENCOUNTER — Encounter: Payer: Self-pay | Admitting: Cardiovascular Disease

## 2017-10-22 VITALS — BP 148/74 | HR 84 | Ht 65.0 in | Wt 152.0 lb

## 2017-10-22 DIAGNOSIS — I5032 Chronic diastolic (congestive) heart failure: Secondary | ICD-10-CM | POA: Diagnosis not present

## 2017-10-22 DIAGNOSIS — I739 Peripheral vascular disease, unspecified: Secondary | ICD-10-CM

## 2017-10-22 DIAGNOSIS — Z5181 Encounter for therapeutic drug level monitoring: Secondary | ICD-10-CM | POA: Diagnosis not present

## 2017-10-22 DIAGNOSIS — E210 Primary hyperparathyroidism: Secondary | ICD-10-CM

## 2017-10-22 DIAGNOSIS — I25810 Atherosclerosis of coronary artery bypass graft(s) without angina pectoris: Secondary | ICD-10-CM | POA: Diagnosis not present

## 2017-10-22 DIAGNOSIS — I480 Paroxysmal atrial fibrillation: Secondary | ICD-10-CM | POA: Diagnosis not present

## 2017-10-22 DIAGNOSIS — E785 Hyperlipidemia, unspecified: Secondary | ICD-10-CM

## 2017-10-22 DIAGNOSIS — Z79899 Other long term (current) drug therapy: Secondary | ICD-10-CM

## 2017-10-22 DIAGNOSIS — Z950 Presence of cardiac pacemaker: Secondary | ICD-10-CM

## 2017-10-22 DIAGNOSIS — I679 Cerebrovascular disease, unspecified: Secondary | ICD-10-CM | POA: Diagnosis not present

## 2017-10-22 DIAGNOSIS — I10 Essential (primary) hypertension: Secondary | ICD-10-CM

## 2017-10-22 LAB — COMPREHENSIVE METABOLIC PANEL
ALT: 15 IU/L (ref 0–32)
AST: 20 IU/L (ref 0–40)
Albumin/Globulin Ratio: 1.9 (ref 1.2–2.2)
Albumin: 4.5 g/dL (ref 3.5–4.8)
Alkaline Phosphatase: 109 IU/L (ref 39–117)
BUN/Creatinine Ratio: 16 (ref 12–28)
BUN: 15 mg/dL (ref 8–27)
Bilirubin Total: 0.4 mg/dL (ref 0.0–1.2)
CO2: 25 mmol/L (ref 20–29)
Calcium: 9.9 mg/dL (ref 8.7–10.3)
Chloride: 100 mmol/L (ref 96–106)
Creatinine, Ser: 0.93 mg/dL (ref 0.57–1.00)
GFR calc Af Amer: 69 mL/min/{1.73_m2} (ref >59)
GFR calc non Af Amer: 59 mL/min/{1.73_m2} — ABNORMAL LOW (ref >59)
Globulin, Total: 2.4 g/dL (ref 1.5–4.5)
Glucose: 99 mg/dL (ref 65–99)
Potassium: 5.7 mmol/L — ABNORMAL HIGH (ref 3.5–5.2)
Sodium: 141 mmol/L (ref 134–144)
Total Protein: 6.9 g/dL (ref 6.0–8.5)

## 2017-10-22 LAB — LIPID PANEL
Chol/HDL Ratio: 2.9 ratio (ref 0.0–4.4)
Cholesterol, Total: 222 mg/dL — ABNORMAL HIGH (ref 100–199)
HDL: 77 mg/dL (ref >39)
LDL Calculated: 116 mg/dL — ABNORMAL HIGH (ref 0–99)
Triglycerides: 146 mg/dL (ref 0–149)
VLDL Cholesterol Cal: 29 mg/dL (ref 5–40)

## 2017-10-22 NOTE — Progress Notes (Signed)
Patient ID: [name], female   DOB: [birth year], 77 y.o.   MRN: [id number]     Cardiology Office Note    Date:  10/22/2017   ID:  [name], DOB [birth year], MRN [id number]  PCP:  [name] Guys, MD  Cardiologist:   Sanda Klein, MD   Chief Complaint  Patient presents with  . Coronary Artery Disease  . Atrial Fibrillation  . Hypertension    History of Present Illness:  [name] is a 77 y.o. female with coronary artery disease s/p bypass surgery (November 2016) and placement of drug-eluting stents (DES to distal SVG-RCA, April 2017 ) and left atrial appendage clipping, symptomatic paroxysmal atrial fibrillation and atrial flutter with recent radiofrequency ablation (caval tricuspid isthmus and pulmonary vein isolation), PAD with previous stents, extensive intracranial atherosclerotic disease, history of GI bleeding while on anticoagulation, volatile hypertension.  She is not taking anticoagulation for atrial fibrillation after several falls, some associated with significant injury.  She restarted amlodipine after her visit at [facility] on August 5.  Her blood pressure seems to have stabilized somewhat and she has not developed ankle swelling.  She did have to take the furosemide a few days in her room around the time when she started the amlodipine.  She clearly is very sodium sensitive: when she eats out, sometimes gets edema.  The medication list from her [facility] visit shows enalapril 2.5 mg.  She does not know if she is taking this medication actually in the past we have to stop her lisinopril because she developed persistent hyperkalemia.  The patient specifically denies any chest pain at rest exertion, dyspnea at rest or with exertion, orthopnea, paroxysmal nocturnal dyspnea, syncope, palpitations, focal neurological deficits, intermittent claudication, lower extremity edema, unexplained weight gain, cough, hemoptysis or wheezing.  She generally feels well.  She is  particularly pleased with her blood pressure control and the lack of palpitations.  Pacemaker interrogation last performed in July shows no evidence of any atrial fibrillation in the recent months.  She has 98% atrial pacing but with a satisfactory heart rate histogram distribution.  The QTc interval is 489 ms on today's ECG tracing.  Amiodarone caused hypothyroidism and alopecia. She has tried taking statins but has repeatedly stopped them due to musculoskeletal problems. Most recently she was intolerant to atorvastatin and rosuvastatin. She is currently not taking any lipid-lowering agents.  She has normal left ventricular systolic function. She does have evidence of grade 2 diastolic dysfunction on previous echo. At the time of her bypass surgery she received an Atricure left atrial appendage clip. She is also on clopidogrel following her drug-eluting stents in April 2017. The pacemaker was implanted in 2013 for symptomatic sinus bradycardia. She also has a history of peripheral arterial disease and received bilateral superficial femoral artery stents about 3 years ago. She has never smoked but drinks daily. She has treated hypertension.  After undergoing bypass surgery in November 2016 she returned with non-ST segment elevation myocardial infarction in April 2017 and required placement of stents both in the native right coronary artery (3.020 mm Synergy DES) and in the saphenous vein graft to the distal right coronary artery (2.7538 mm Synergy DES) due to early graft dysfunction. The saphenous vein graft to the ramus intermedius was also occluded, but this vessel was left for medical therapy.    Past Medical History:  Diagnosis Date  . ANXIETY 12/31/2009  . Aortic insufficiency    a. mod by echo 2017.  Marland Kitchen  CAD (coronary artery disease)    a. s/p CABGx3 and LAA clipping in 12/2014, NSTEMI 05/2015 s/p DES to native RCA and SVG-dRCA; occluded ramus-SVG was treated medically). c. neg nuc 10/2015 at [facility].    . Chronic diastolic CHF (congestive heart failure) ([place])   . Chronic fatigue   . CKD (chronic kidney disease), stage II   . DEPRESSION 12/31/2009  . [place] DISEASE, CERVICAL 12/31/2009  . [place] DISEASE, LUMBAR 12/31/2009  . DIVERTICULITIS, HX OF 12/31/2009  . Essential hypertension   . Gait abnormality 03/25/2017  . GLUCOSE INTOLERANCE 12/31/2009  . Habitual alcohol use   . History of stroke    self reports " they say i have had some pin strokes"   . Hypercalcemia   . Hyperkalemia   . Hyperlipidemia 12/31/2009  . MENOPAUSE, EARLY 12/31/2009  . NSTEMI (non-ST elevated myocardial infarction) ([place]) 05/11/2015  . Orthostatic hypotension   . OSTEOARTHRITIS, HAND   . Pacemaker 01/21/2012   MDT Adapta dual chamber  . Paroxysmal atrial flutter ([place])   . Persistent atrial fibrillation ([place])   . Pre-diabetes    PATIENT DENIES  . PVD (peripheral vascular disease), hx stents to bil SFAs 02/2010   . Rash, skin     superficial raised red pencil point sized rash bilateral forearms; states " it started when i started the Plavix "   . Symptomatic sinus bradycardia 01/22/2012  . Syncope    a. 11/2015 ? due to medications.  . Syncope    reports on 05-10-17 " i passed out 2 months ago in the bathroom and broke some ribs"    . Tachy-brady syndrome ([place])    a. s/p MDT PPM 2013.  . Tricuspid regurgitation     Past Surgical History:  Procedure Laterality Date  . ABDOMINAL AORTOGRAM W/LOWER EXTREMITY Bilateral 02/15/2017   Procedure: ABDOMINAL AORTOGRAM W/LOWER EXTREMITY;  Surgeon: Lorretta Harp, MD;  Location: [place] [facility];  Service: Cardiovascular;  Laterality: Bilateral;  bilat  . Ablation of typical atrial flutter (CTI line)  09/05/2016   Dr Antonieta Pert at [facility]   . AUGMENTATION MAMMAPLASTY  1983  . CARDIAC CATHETERIZATION N/A 01/01/2015   Procedure: Left Heart Cath and Coronary Angiography;  Surgeon: Jettie Booze, MD;  Location: [place] [facility];  Service: Cardiovascular;   Laterality: N/A;  . CARDIAC CATHETERIZATION N/A 05/12/2015   Procedure: Left Heart Cath and Coronary Angiography;  Surgeon: Troy Sine, MD;  Location: [place] [facility];  Service: Cardiovascular;  Laterality: N/A;  . CARDIAC CATHETERIZATION N/A 05/12/2015   Procedure: Coronary Stent Intervention;  Surgeon: Troy Sine, MD;  Location: [place] [facility];  Service: Cardiovascular;  Laterality: N/A;  . CARDIOVERSION N/A 02/01/2015   Procedure: CARDIOVERSION;  Surgeon: Lelon Perla, MD;  Location: [facility] ENDOSCOPY;  Service: Cardiovascular;  Laterality: N/A;  . CARDIOVERSION N/A 08/30/2015   Procedure: CARDIOVERSION;  Surgeon: Sanda Klein, MD;  Location: [place] [facility]OS[facility];  Service: Cardiovascular;  Laterality: N/A;  . CARPAL TUNNEL RELEASE  2008   "right hand/thumb; carpal tunnel repair; got rid of arthritis" (01/21/2012)  . CLIPPING OF ATRIAL APPENDAGE N/A 01/04/2015   Procedure: CLIPPING OF ATRIAL APPENDAGE;  Surgeon: Grace Isaac, MD;  Location: [place];  Service: Open Heart Surgery;  Laterality: N/A;  . COLONOSCOPY N/A 12/21/2015   Procedure: COLONOSCOPY;  Surgeon: Milus Banister, MD;  Location: [facility] ENDOSCOPY;  Service: Endoscopy;  Laterality: N/A;  . CORONARY ARTERY BYPASS GRAFT N/A 01/04/2015   Procedure: CORONARY ARTERY  BYPASS GRAFTING (CABG) x 3 using left internal mammory artery and greater saphenous vein right leg harvested endoscopically.;  Surgeon: Grace Isaac, MD;  LIMA-LAD, SVG-RI, SVG-PDA  . ESOPHAGOGASTRODUODENOSCOPY (EGD) WITH PROPOFOL N/A 12/18/2015   Procedure: ESOPHAGOGASTRODUODENOSCOPY (EGD) WITH PROPOFOL;  Surgeon: Milus Banister, MD;  Location: [facility]OS[facility];  Service: Endoscopy;  Laterality: N/A;  . FACELIFT, LOWER 2/3  1995   "mini" (01/21/2012)  . GIVENS CAPSULE STUDY N/A 12/21/2015   Procedure: GIVENS CAPSULE STUDY;  Surgeon: Milus Banister, MD;  Location: [facility];  Service: Endoscopy;  Laterality: N/A;  . Lower Arterial Examination  10/28/2011   R. SFA  stent mild-moderate mixed density plaque with elevated velocities consistent with 50% diameter reduction. L. SFA stent moderate mixed denisty plaque at mid to distal level consistent with 50-69% diameter reduction.  . LOWER EXTREMITY ANGIOGRAPHY N/A 02/15/2017   Procedure: LOWER EXTREMITY ANGIOGRAPHY;  Surgeon: Lorretta Harp, MD;  Location: [place] [facility];  Service: Cardiovascular;  Laterality: N/A;  . OOPHORECTOMY  ~1979  . PARATHYROIDECTOMY N/A 05/13/2017   Procedure: PARATHYROIDECTOMY;  Surgeon: Armandina Gemma, MD;  Location: [facility];  Service: General;  Laterality: N/A;  . PARTIAL COLECTOMY  2010  . PERIPHERAL ARTERIAL STENT GRAFT  2012; 2012   "LLE; RLE" (01/21/2012)  . PERIPHERAL VASCULAR BALLOON ANGIOPLASTY Right 02/15/2017   Procedure: PERIPHERAL VASCULAR BALLOON ANGIOPLASTY;  Surgeon: Lorretta Harp, MD;  Location: [place] [facility];  Service: Cardiovascular;  Laterality: Right;  SFA    . PERMANENT PACEMAKER INSERTION N/A 01/21/2012   Medtronic Adapta L implanted by Dr Sallyanne Kuster for tachy/brady syndrome  . POSTERIOR CERVICAL LAMINECTOMY  1985  . TEE WITHOUT CARDIOVERSION N/A 01/04/2015   Procedure: TRANSESOPHAGEAL ECHOCARDIOGRAM (TEE);  Surgeon: Grace Isaac, MD;  Location: [place];  Service: Open Heart Surgery;  Laterality: N/A;  . TEE WITHOUT CARDIOVERSION N/A 02/01/2015   Procedure: TRANSESOPHAGEAL ECHOCARDIOGRAM (TEE);  Surgeon: Lelon Perla, MD;  Location[facility] END[facility]COPY;  Service: Cardiovascular;  Laterality: N/A;  . TEE WITHOUT CARDIOVERSION N/A 08/30/2015   Procedure: TRANSESOPHAGEAL ECHOCARDIOGRAM (TEE);  Surgeon: Sanda Klein, MD;  Location: [facility] ENDOSCOPY;  Service: Cardiovascular;  Laterality: N/A;  . VAGINAL HYSTERECTOMY  1975    Current Medications: Outpatient Medications Prior to Visit  Medication Sig Dispense Refill  . alendronate (FOSAMAX) 70 MG tablet Take 70 mg by mouth every Thursday.     . cholecalciferol (VITAMIN D) 1000 UNITS tablet Take 1,000 Units  by mouth daily.     . citalopram (CELEXA) 20 MG tablet Take 0.5 tablets (10 mg total) by mouth daily. Future refills from PCP 90 tablet 0  . clopidogrel (PLAVIX) 75 MG tablet TAKE 1 TABLET BY MOUTH EVERY DAY WITH BREAKFAST 30 tablet 6  . Coenzyme Q10 (CO Q-10) 100 MG CAPS Take 100 mg by mouth at bedtime.     . diazepam (VALIUM) 5 MG tablet Take 1-2 tablets (5-10 mg total) by mouth every 8 (eight) hours as needed for muscle spasms. 15 tablet 0  . dofetilide (TIKOSYN) 250 MCG capsule Take 250 mcg by mouth 2 (two) times daily.    Marland Kitchen levothyroxine (SYNTHROID, LEVOTHROID) 25 MCG tablet Take 25-50 mcg by mouth daily before breakfast. Take 1 tablet (25 mcg) by mouth Monday through Thursday, then take 2 tablets (50 mcg) on Friday, Saturday, & Sunday.    Marland Kitchen MAGNESIUM CITRATE PO Take 400 mg by mouth daily.    . Multiple Vitamin (MULTIVITAMIN WITH MINERALS) TABS tablet Take 2 tablets by mouth  daily.     . nitroGLYCERIN (NITROSTAT) 0.4 MG SL tablet Place 1 tablet (0.4 mg total) under the tongue every 5 (five) minutes x 3 doses as needed for chest pain. 25 tablet 2  . Omega-3 Fatty Acids (SUPER OMEGA-3) 1000 MG CAPS Take 1,000 mg by mouth at bedtime.     . Phosphatidylserine 100 MG CAPS Take 2 capsules by mouth daily.    Marland Kitchen amLODipine (NORVASC) 5 MG tablet Take 5 mg by mouth daily.  3  . carvedilol (COREG) 12.5 MG tablet Take 1 tablet (12.5 mg total) by mouth 2 (two) times daily with a meal. 180 tablet 3  . diclofenac sodium (VOLTAREN) 1 % GEL Apply 2 g topically 4 (four) times daily. 100 g 1  . dofetilide (TIKOSYN) 250 MCG capsule Take 250 mcg by mouth 2 (two) times daily. (0900 & 2100)    . gabapentin (NEURONTIN) 100 MG capsule Take 1 capsule (100 mg total) by mouth 3 (three) times daily. 90 capsule 3  . methocarbamol (ROBAXIN) 500 MG tablet Take 1 tablet (500 mg total) by mouth 3 (three) times daily. 60 tablet 1  . predniSONE (DELTASONE) 10 MG tablet Begin taking 6 tablets daily, taper by one tablet every other  day until off the medication. 42 tablet 0  . traMADol (ULTRAM) 50 MG tablet Take 1-2 tablets (50-100 mg total) by mouth every 6 (six) hours as needed for moderate pain. 15 tablet 0   No facility-administered medications prior to visit.      Allergies:   Brilinta [ticagrelor]; Clonidine derivatives; Atorvastatin; Crestor [rosuvastatin calcium]; Exforge [amlodipine besylate-valsartan]; and Spironolactone   Social History   Socioeconomic History  . Marital status: Widowed    Spouse name: Not on file  . Number of children: 3  . Years of education: Not on file  . Highest education level: Not on file  Occupational History  . 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 Financial resource strain: Not on file  . Food insecurity:    Worry: Not on file    Inability: Not on file  . Transportation needs:    Medical: Not on file    Non-medical: Not on file  Tobacco Use  . Smoking status: Former Smoker    Packs/day: 0.75    Years: 10.00    Pack years: 7.50    Types: Cigarettes    Last attempt to quit: 2008    Years since quitting: 11.7  . Smokeless tobacco: Never Used  . Tobacco comment: 01/21/2012 "quit smoking ~ 2002"  Substance and Sexual Activity  . Alcohol use: Yes    Comment: admits t.o drinking 3+ drinks last night, and possibly daily  . Drug use: No  . Sexual activity: Not Currently  Lifestyle  . Physical activity:    Days per week: Not on file    Minutes per session: Not on file  . Stress: Not on file  Relationships  . Social connections:    Talks on phone: Not on file    Gets together: Not on file    Attends religious service: Not on file    Active member of club or organization: Not on file    Attends meetings of clubs or organizations: Not on file    Relationship status: Not on file  Other Topics Concern  . Not on file  Social History Narrative   Lives in [place].  Retired.     Family History:  The patient's family  history  includes CVA in her brother; Heart attack in her brother; Heart disease in her brother, father, and mother; Hypertension in her brother and mother; Stroke in her father and mother.   ROS:   Please see the history of present illness.    ROS all other systems are reviewed and are negative  PHYSICAL EXAM:   VS:  BP (!) 148/74 (BP Location: Right Arm, Patient Position: Supine, Cuff Size: Normal)   Pulse 84   Ht 5\' 5"  (1.651 m)   Wt 152 lb (68.9 kg)   BMI 25.29 kg/m     Wt Readings from Last 3 Encounters:  10/22/17 152 lb (68.9 kg)  05/13/17 149 lb (67.6 kg)  05/10/17 149 lb (67.6 kg)    Studies/Labs Reviewed:   EKG:  EKG is ordered today.  Atrial paced, ventricular sensed rhythm.  Mild ST segment depression and T wave inversion in V4-V6, 2, aVF.  QTc 489 ms Recent Labs: 05/10/2017: Hemoglobin 15.1; Platelets 250 10/22/2017: ALT 15; BUN 15; Creatinine, Ser 0.93; Potassium 5.7; Sodium 141   Lipid Panel    Component Value Date/Time   CHOL 222 (H) 10/22/2017 0914   TRIG 146 10/22/2017 0914   HDL 77 10/22/2017 0914   CHOLHDL 2.9 10/22/2017 0914   CHOLHDL 2.5 05/12/2015 0324   VLDL 23 05/12/2015 0324   LDLCALC 116 (H) 10/22/2017 0914   10/04/2015  BUN 38 creatinine 1.46, Ca 11.6, K 5.8.  Total cholesterol 244, triglycerides 195, HDL 63, LDL 142 10/15/2015  creatinine 1.05, BUN 23, K 6.3, Ca 11.6, ionized calcium  6.0. Intact PTH 83. TSH of 4.88,  free T4 of 1.13. 09/09/2016 Creatinine 1.08, BUN 18, potassium 5.1  ASSESSMENT:    1. PAF (paroxysmal atrial fibrillation) ([place])   2. Encounter for monitoring dofetilide therapy   3. Pacemaker   4. Chronic diastolic heart failure ([place])   5. Coronary artery disease involving coronary bypass graft of native heart without angina pectoris   6. PAD (peripheral artery disease) ([place])   7. Cerebrovascular disease   8. Essential hypertension   9. Primary hyperparathyroidism ([place])   10. Dyslipidemia   11. Medication management     PLAN:  In  order of problems listed above:  1. AFib: Remarkably durable reduction in arrhythmia burden after ablation and dofetilide therapy.  No atrial fibrillation seen on her last download.  No subjective palpitations.  She has had previous clipping of her left atrial appendage.  She is at high risk for bleeding complications due to a tendency to fall, which has recently lead to serious injuries.  The risk of anticoagulation is not worth it. 2. Dofetilide: QTc interval in acceptable range.  Need to recheck creatinine and potassium every 6 months.  Reviewed the need to protect against possible drug interactions. 3. PPM: She has a Medtronic Adapta device and 5076 atrial and ventricular leads.  Her device was not interrogated today, but normal parameters on recent remote download. 4. CHF: She is only taking furosemide intermittently when she develops ankle swelling, less than weekly.  Started back on amlodipine and swelling has not worsened. 5. CAD: Asymptomatic. Unfortunately, she had early failure of 2 saphenous vein grafts, one of which was treated with a drug-eluting stent. Her mammary artery bypass was widely patent. 6. PAD: Has not had intermittent claudication since her revascularization procedure with Dr. Gwenlyn Found.  Note that she also has a lower blood pressure in her left upper extremity compared to the right.  Should always have her  blood pressure checked in the right upper extremity. 7. ASCVD: She has mostly small vessel disease and the plan is for her to remain on clopidogrel indefinitely.  Avoid excessive blood pressure lowering. 8. HTN: Blood pressure in the desirable range today.  Always check blood pressure in the right arm.  Need to review her home medicines to see if she is truly taking enalapril.  We had to stop her ACE inhibitor recently because of hyperkalemia. 9. Hypercalcemia: s/p parathyroidectomy in April 15.  We will get a calcium level on her labs today. 10. Hypercholesterolemia: Recheck  labs.  If she cannot tolerate statins we need try to get her on Repatha.    Medication Adjustments/Labs and Tests Ordered: Current medicines are reviewed at length with the patient today.  Concerns regarding medicines are outlined above.  Medication changes, Labs and Tests ordered today are listed in the Patient Instructions below. Patient Instructions  Medication Instructions: Dr Sallyanne Kuster recommends that you continue on your current medications as directed. Please refer to the Current Medication list given to you today.  Labwork: Your physician recommends that you return for lab work at your earliest [place].  Testing/Procedures: NONE ORDERED  Follow-up: Dr Sallyanne Kuster recommends that you schedule a follow-up appointment in 6 months. You will receive a reminder letter in the mail two months in advance. If you don't receive a letter, please call our office to schedule the follow-up appointment.  If you need a refill on your cardiac medications before your next appointment, please call your pharmacy.    Signed, Sanda Klein, MD  10/22/2017 6:39 PM    [facility] [address], [place], [place]  [address] Phone: [phone]; Fax: [phone]

## 2017-10-22 NOTE — Patient Instructions (Signed)
Medication Instructions: Dr Croitoru recommends that you continue on your current medications as directed. Please refer to the Current Medication list given to you today.  Labwork: Your physician recommends that you return for lab work at your earliest convenience - FASTING.  Testing/Procedures: NONE ORDERED  Follow-up: Dr Croitoru recommends that you schedule a follow-up appointment in 6 months. You will receive a reminder letter in the mail two months in advance. If you don't receive a letter, please call our office to schedule the follow-up appointment.  If you need a refill on your cardiac medications before your next appointment, please call your pharmacy. 

## 2017-11-01 ENCOUNTER — Ambulatory Visit: Payer: Medicare HMO | Admitting: Cardiovascular Disease

## 2017-11-08 DIAGNOSIS — R69 Illness, unspecified: Secondary | ICD-10-CM | POA: Diagnosis not present

## 2017-11-10 ENCOUNTER — Encounter: Payer: Self-pay | Admitting: Emergency Medicine

## 2017-11-10 ENCOUNTER — Ambulatory Visit (INDEPENDENT_AMBULATORY_CARE_PROVIDER_SITE_OTHER): Payer: Medicare HMO | Admitting: Emergency Medicine

## 2017-11-10 ENCOUNTER — Other Ambulatory Visit: Payer: Self-pay

## 2017-11-10 VITALS — BP 113/68 | HR 82 | Temp 98.0°F | Resp 16 | Ht 65.0 in | Wt 153.0 lb

## 2017-11-10 DIAGNOSIS — N63 Unspecified lump in unspecified breast: Secondary | ICD-10-CM | POA: Diagnosis not present

## 2017-11-10 NOTE — Progress Notes (Signed)
[name] 77 y.o.   Chief Complaint  Patient presents with  . Breast Mass    left breast " pt states she has implants"    HISTORY OF PRESENT ILLNESS: This is a 77 y.o. female noticed a left-sided breast lump couple weeks ago.  Has a history of breast implants.  Denies pain or trauma.  No other significant symptoms.  HPI   Prior to Admission medications   Medication Sig Start Date End Date Taking? Authorizing Provider  alendronate (FOSAMAX) 70 MG tablet Take 70 mg by mouth every Thursday.  03/24/16  Yes [provider]  amLODipine (NORVASC) 5 MG tablet Take 5 mg by mouth daily. 09/14/17  Yes [provider]  cholecalciferol (VITAMIN D) 1000 UNITS tablet Take 1,000 Units by mouth daily.    Yes [provider]  citalopram (CELEXA) 20 MG tablet Take 0.5 tablets (10 mg total) by mouth daily. Future refills from PCP 10/19/17  Yes Croitoru, Mihai, MD  Coenzyme Q10 (CO Q-10) 100 MG CAPS Take 100 mg by mouth at bedtime.    Yes [provider]  dofetilide (TIKOSYN) 250 MCG capsule Take 250 mcg by mouth 2 (two) times daily.   Yes [provider]  levothyroxine (SYNTHROID, LEVOTHROID) 25 MCG tablet Take 25-50 mcg by mouth daily before breakfast. Take 1 tablet (25 mcg) by mouth Monday through Thursday, then take 2 tablets (50 mcg) on Friday, Saturday, & Sunday. 04/03/16  Yes [provider]  MAGNESIUM CITRATE PO Take 400 mg by mouth daily.   Yes [provider]  Multiple Vitamin (MULTIVITAMIN WITH MINERALS) TABS tablet Take 2 tablets by mouth daily.    Yes [provider]  nitroGLYCERIN (NITROSTAT) 0.4 MG SL tablet Place 1 tablet (0.4 mg total) under the tongue every 5 (five) minutes x 3 doses as needed for chest pain. 05/15/15  Yes Simmons, Brittainy M, PA-C  Omega-3 Fatty Acids (SUPER OMEGA-3) 1000 MG CAPS Take 1,000 mg by mouth at bedtime.    Yes [provider]  Phosphatidylserine 100 MG CAPS Take 2 capsules by mouth  daily.   Yes [provider]    Allergies  Allergen Reactions  . Brilinta [Ticagrelor] Shortness Of Breath  . Clonidine Derivatives Other (See Comments)    Lowers heart rate  . Atorvastatin Other (See Comments)    Possible cause of fatigue/malaise  . Crestor [Rosuvastatin Calcium] Other (See Comments)    Joint/muscle aches  . Exforge [Amlodipine Besylate-Valsartan] Itching and Rash  . Spironolactone Other (See Comments)    Contraindicated with history of hyperkalemia    Patient Active Problem List   Diagnosis Date Noted  . Coronary artery disease involving coronary bypass graft of native heart without angina pectoris 10/22/2017  . Dyslipidemia 10/22/2017  . Cerebrovascular small vessel disease 04/27/2017  . Gait abnormality 03/25/2017  . Cerebrovascular disease 03/25/2017  . Claudication in peripheral vascular disease ([place]) 02/15/2017  . Encounter for monitoring dofetilide therapy 09/25/2016  . Hyperparathyroidism ([place]) 05/13/2016  . Pacemaker 01/10/2016  . Heme positive stool   . Benign neoplasm of ascending colon   . Long term current use of anticoagulant   . Gastrointestinal hemorrhage 12/17/2015  . Acute blood loss anemia 12/17/2015  . CKD (chronic kidney disease), stage II   . Syncope 12/05/2015  . Hypotension 12/05/2015  . Hyperkalemia 12/04/2015  . H/O amiodarone therapy 12/04/2015  . Chronic diastolic heart failure ([place]) 10/19/2015  . Hypercalcemia 10/19/2015  . Atypical atrial flutter ([place])   . Paroxysmal  atrial flutter ([place]) 08/23/2015  . Febrile illness, acute 06/27/2015  . Chronic fatigue 06/27/2015  . Stented coronary artery   . NSTEMI (non-ST elevated myocardial infarction) ([place]) 05/11/2015  . S/P CABG x 3 01/04/15 01/10/2015  . CAD (coronary artery disease) 01/04/2015  . Symptomatic sinus bradycardia 01/22/2012  . PAF (paroxysmal atrial fibrillation) ([place]) 01/22/2012  . Sinus node dysfunction ([place]) 01/22/2012  . S/P placement of cardiac  pacemaker, medtronic adapta 01/21/12 01/22/2012  . PVD (peripheral vascular disease), hx stents to bil SFAs 02/2010 01/22/2012  . Hyperlipidemia 12/31/2009  . Anxiety 12/31/2009  . DEPRESSION 12/31/2009  . Essential hypertension 12/31/2009  . Coronary atherosclerosis 12/31/2009  . OSTEOARTHRITIS, HAND 12/31/2009  . [place] DISEASE, CERVICAL 12/31/2009  . [place] DISEASE, LUMBAR 12/31/2009  . UNSPECIFIED URINARY INCONTINENCE 12/31/2009    Past Medical History:  Diagnosis Date  . ANXIETY 12/31/2009  . Aortic insufficiency    a. mod by echo 2017.  [place] Kitchen CAD (coronary artery disease)    a. s/p CABGx3 and LAA clipping in 12/2014, NSTEMI 05/2015 s/p DES to native RCA and SVG-dRCA; occluded ramus-SVG was treated medically). c. neg [facility]uc 10/2015 [facility].  . Chronic diastolic CHF (congestive heart failure) ([place])   . Chronic fatigue   . CKD (chronic kidney disease), stage II   . DEPRESSION 12/31/2009  . [place] DISEASE, CERVICAL 12/31/2009  . [place] DISEASE, LUMBAR 12/31/2009  . DIVERTICULITIS, HX OF 12/31/2009  . Essential hypertension   . Gait abnormality 03/25/2017  . GLUCOSE INTOLERANCE 12/31/2009  . Habitual alcohol use   . History of stroke    self reports " they say i have had some pin strokes"   . Hypercalcemia   . Hyperkalemia   . Hyperlipidemia 12/31/2009  . MENOPAUSE, EARLY 12/31/2009  . NSTEMI (non-ST elevated myocardial infarction) ([place]) 05/11/2015  . Orthostatic hypotension   . OSTEOARTHRITIS, HAND   . Pacemaker 01/21/2012   MDT Adapta dual chamber  . Paroxysmal atrial flutter ([place])   . Persistent atrial fibrillation   . Pre-diabetes    PATIENT DENIES  . PVD (peripheral vascular disease), hx stents to bil SFAs 02/2010   . Rash, skin     superficial raised red pencil point sized rash bilateral forearms; states " it started when i started the Plavix "   . Symptomatic sinus bradycardia 01/22/2012  . Syncope    a. 11/2015 ? due to medications.  . Syncope    reports on 05-10-17 " i passed  out 2 months ago in the bathroom and broke some ribs"    . Tachy-brady syndrome ([place])    a. s/p MDT PPM 2013.  . Tricuspid regurgitation     Past Surgical History:  Procedure Laterality Date  . ABDOMINAL AORTOGRAM W/LOWER EXTREMITY Bilateral 02/15/2017   Procedure: ABDOMINAL AORTOGRAM W/LOWER EXTREMITY;  Surgeon: Lorretta Harp, MD;  Location: [place] [facility];  Service: Cardiovascular;  Laterality: Bilateral;  bilat  . Ablation of typical atrial flutter (CTI line)  09/05/2016   Dr Antonieta Pert at [facility]   . AUGMENTATION MAMMAPLASTY  1983  . CARDIAC CATHETERIZATION N/A 01/01/2015   Procedure: Left Heart Cath and Coronary Angiography;  Surgeon: Jettie Booze, MD;  Location: [place] [facility];  Service: Cardiovascular;  Laterality: N/A;  . CARDIAC CATHETERIZATION N/A 05/12/2015   Procedure: Left Heart Cath and Coronary Angiography;  Surgeon: Troy Sine, MD;  Location: [place] [facility];  Service: Cardiovascular;  Laterality: N/A;  . CARDIAC CATHETERIZATION N/A 05/12/2015   Procedure:  Coronary Stent Intervention;  Surgeon: Troy Sine, MD;  Location: [place] [facility];  Service: Cardiovascular;  Laterality: N/A;  . CARDIOVERSION N/A 02/01/2015   Procedure: CARDIOVERSION;  Surgeon: Lelon Perla, MD;  Location: [facility] ENDOSCOPY;  Service: Cardiovascular;  Laterality: N/A;  . CARDIOVERSION N/A 08/30/2015   Procedure: CARDIOVERSION;  Surgeon: Sanda Klein, MD;  Location: [place] [facility]OSCOPY;  Service: Cardiovascular;  Laterality: N/A;  . CARPAL TUNNEL RELEASE  2008   "right hand/thumb; carpal tunnel repair; got rid of arthritis" (01/21/2012)  . CLIPPING OF ATRIAL APPENDAGE N/A 01/04/2015   Procedure: CLIPPING OF ATRIAL APPENDAGE;  Surgeon: Grace Isaac, MD;  Location: [place];  Service: Open Heart Surgery;  Laterality: N/A;  . COLONOSCOPY N/A 12/21/2015   Procedure: COLONOSCOPY;  Surgeon: Milus Banister, MD;  Location: [facility] ENDOSCOPY;  Service: Endoscopy;  Laterality: N/A;  . CORONARY  ARTERY BYPASS GRAFT N/A 01/04/2015   Procedure: CORONARY ARTERY BYPASS GRAFTING (CABG) x 3 using left internal mammory artery and greater saphenous vein right leg harvested endoscopically.;  Surgeon: Grace Isaac, MD;  LIMA-LAD, SVG-RI, SVG-PDA  . ESOPHAGOGASTRODUODENOSCOPY (EGD) WITH PROPOFOL N/A 12/18/2015   Procedure: ESOPHAGOGASTRODUODENOSCOPY (EGD) WITH PROPOFOL;  Surgeon: Milus Banister, MD;  Location: [facility]OS[facility];  Service: Endoscopy;  Laterality: N/A;  . FACELIFT, LOWER 2/3  1995   "mini" (01/21/2012)  . GIVENS CAPSULE STUDY N/A 12/21/2015   Procedure: GIVENS CAPSULE STUDY;  Surgeon: Milus Banister, MD;  Location: [facility];  Service: Endoscopy;  Laterality: N/A;  . Lower Arterial Examination  10/28/2011   R. SFA stent mild-moderate mixed density plaque with elevated velocities consistent with 50% diameter reduction. L. SFA stent moderate mixed denisty plaque at mid to distal level consistent with 50-69% diameter reduction.  . LOWER EXTREMITY ANGIOGRAPHY N/A 02/15/2017   Procedure: LOWER EXTREMITY ANGIOGRAPHY;  Surgeon: Lorretta Harp, MD;  Location: [place] [facility];  Service: Cardiovascular;  Laterality: N/A;  . OOPHORECTOMY  ~1979  . PARATHYROIDECTOMY N/A 05/13/2017   Procedure: PARATHYROIDECTOMY;  Surgeon: Armandina Gemma, MD;  Location: [facility];  Service: General;  Laterality: N/A;  . PARTIAL COLECTOMY  2010  . PERIPHERAL ARTERIAL STENT GRAFT  2012; 2012   "LLE; RLE" (01/21/2012)  . PERIPHERAL VASCULAR BALLOON ANGIOPLASTY Right 02/15/2017   Procedure: PERIPHERAL VASCULAR BALLOON ANGIOPLASTY;  Surgeon: Lorretta Harp, MD;  Location: [place] [facility];  Service: Cardiovascular;  Laterality: Right;  SFA    . PERMANENT PACEMAKER INSERTION N/A 01/21/2012   Medtronic Adapta L implanted by Dr Sallyanne Kuster for tachy/brady syndrome  . POSTERIOR CERVICAL LAMINECTOMY  1985  . TEE WITHOUT CARDIOVERSION N/A 01/04/2015   Procedure: TRANSESOPHAGEAL ECHOCARDIOGRAM (TEE);  Surgeon: Grace Isaac, MD;  Location: [place];  Service: Open Heart Surgery;  Laterality: N/A;  . TEE WITHOUT CARDIOVERSION N/A 02/01/2015   Procedure: TRANSESOPHAGEAL ECHOCARDIOGRAM (TEE);  Surgeon: Lelon Perla, MD;  Location: [facility] ENDOSCOPY;  Service: Cardiovascular;  Laterality: N/A;  . TEE WITHOUT CARDIOVERSION N/A 08/30/2015   Procedure: TRANSESOPHAGEAL ECHOCARDIOGRAM (TEE);  Surgeon: Sanda Klein, MD;  Location: [facility] ENDOSCOPY;  Service: Cardiovascular;  Laterality: N/A;  . [place] History   Socioeconomic History  . Marital status: Widowed    Spouse name: Not on file  . Number of children: 3  . Years of education: Not on file  . Highest education level: Not on file  Occupational History  . 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  Financial resource strain: Not on file  . Food insecurity:    Worry: Not on file    Inability: Not on file  . Transportation needs:    Medical: Not on file    Non-medical: Not on file  Tobacco Use  . Smoking status: Former Smoker    Packs/day: 0.75    Years: 10.00    Pack years: 7.50    Types: Cigarettes    Last attempt to quit: 2008    Years since quitting: 11.7  . Smokeless tobacco: Never Used  . Tobacco comment: 01/21/2012 "quit smoking ~ 2002"  Substance and Sexual Activity  . Alcohol use: Yes    Comment: admits t.o drinking 3+ drinks last night, and possibly daily  . Drug use: No  . Sexual activity: Not Currently  Lifestyle  . Physical activity:    Days per week: Not on file    Minutes per session: Not on file  . Stress: Not on file  Relationships  . Social connections:    Talks on phone: Not on file    Gets together: Not on file    Attends religious service: Not on file    Active member of club or organization: Not on file    Attends meetings of clubs or organizations: Not on file    Relationship status: Not on file  . Intimate partner violence:    Fear of current or ex  partner: Not on file    Emotionally abused: Not on file    Physically abused: Not on file    Forced sexual activity: Not on file  Other Topics Concern  . Not on file  Social History Narrative   Lives in [place].  Retired.    Family History  Problem Relation Age of Onset  . Heart disease Mother   . Hypertension Mother   . Stroke Mother   . Stroke Father   . Heart disease Father   . Heart disease Brother   . Hypertension Brother        2 brothers  . CVA Brother        2 brothers  . Heart attack Brother        2 brothers     Review of Systems  Constitutional: Negative.  Negative for chills and fever.  Respiratory: Negative for shortness of breath.   Cardiovascular: Negative for chest pain and palpitations.  Gastrointestinal: Negative for nausea and vomiting.  Skin: Negative.  Negative for rash.  Neurological: Negative for dizziness and headaches.  Endo/Heme/Allergies: Does not bruise/bleed easily.      Vitals:   11/10/17 1607  BP: 113/68  Pulse: 82  Resp: 16  Temp: 98 F (36.7 C)  SpO2: 94%    Physical Exam  Constitutional: She is oriented to person, place, and time. She appears well-developed and well-nourished.  HENT:  Head: Normocephalic and atraumatic.  Eyes: Pupils are equal, round, and reactive to light.  Neck: Normal range of motion.  Cardiovascular: Normal rate and regular rhythm.  Pulmonary/Chest: Effort normal and breath sounds normal.    Musculoskeletal: Normal range of motion.  Neurological: She is alert and oriented to person, place, and time.  Skin: Skin is warm and dry.  Psychiatric: She has a normal mood and affect. Her behavior is normal.  Vitals reviewed.    ASSESSMENT & PLAN: [name] was seen today for breast mass.  Diagnoses and all orders for this visit:  Breast lump -     MM DIAG BREAST W/IMPLANT BILATERAL;  Future -     [place] Unlisted Procedure Breast; Future    Patient Instructions       If you have lab work done  today you will be contacted with your lab results within the next 2 weeks.  If you have not heard from [place] then please contact us. The fastest way to get your results is to register for My Chart.   IF you received an x-ray today, you will receive an invoice from [facility]. Please contact [facility] at [phone] with questions or concerns regarding your invoice.   IF you received labwork today, you will receive an invoice from [place]. Please contact [facility] at [phone] with questions or concerns regarding your invoice.   Our billing staff will not be able to assist you with questions regarding bills from these companies.  You will be contacted with the lab results as soon as they are available. The fastest way to get your results is to activate your My Chart account. Instructions are located on the last page of this paperwork. If you have not heard from [place] regarding the results in 2 weeks, please contact this office.      Mammogram A mammogram is an X-ray of the breasts that is done to check for changes that are not normal. This test can screen for and find any changes that may suggest breast cancer. This test can also help to find other changes and variations in the breast. What happens before the procedure?  Have this test done about 1-2 weeks after your period. This is usually when your breasts are the least tender.  If you are visiting a new doctor or clinic, send any past mammogram images to your new doctor's office.  Wash your breasts and under your arms the day of the test.  Do not use deodorants, perfumes, lotions, or powders on the day of the test.  Take off any jewelry from your neck.  Wear clothes that you can change into and out of easily. What happens during the procedure?  You will undress from the waist up. You will put on a gown.  You will stand in front of the X-ray machine.  Each breast will be placed between two plastic or glass  plates. The plates will press down on your breast for a few seconds. Try to stay as relaxed as possible. This does not cause any harm to your breasts. Any discomfort you feel will be very brief.  X-rays will be taken from different angles of each breast. The procedure may vary among doctors and hospitals. What happens after the procedure?  The mammogram will be looked at by a specialist (radiologist).  You may need to do certain parts of the test again. This depends on the quality of the images.  Ask when your test results will be ready. Make sure you get your test results.  You may go back to your normal activities. This information is not intended to replace advice given to you by your health care provider. Make sure you discuss any questions you have with your health care provider. Document Released: 04/24/2008 Document Revised: 07/04/2015 Document Reviewed: 04/06/2014 Elsevier Interactive Patient Education  2018 Elsevier Inc.      Agustina Caroli, MD [facility]

## 2017-11-10 NOTE — Patient Instructions (Addendum)
     If you have lab work done today you will be contacted with your lab results within the next 2 weeks.  If you have not heard from [place] then please contact us. The fastest way to get your results is to register for My Chart.   IF you received an x-ray today, you will receive an invoice from [facility]. Please contact [facility]iology at [phone] with questions or concerns regarding your invoice.   IF you received labwork today, you will receive an invoice from [place]. Please contact [facility] at [phone] with questions or concerns regarding your invoice.   Our billing staff will not be able to assist you with questions regarding bills from these companies.  You will be contacted with the lab results as soon as they are available. The fastest way to get your results is to activate your My Chart account. Instructions are located on the last page of this paperwork. If you have not heard from [place] regarding the results in 2 weeks, please contact this office.      Mammogram A mammogram is an X-ray of the breasts that is done to check for changes that are not normal. This test can screen for and find any changes that may suggest breast cancer. This test can also help to find other changes and variations in the breast. What happens before the procedure?  Have this test done about 1-2 weeks after your period. This is usually when your breasts are the least tender.  If you are visiting a new doctor or clinic, send any past mammogram images to your new doctor's office.  Wash your breasts and under your arms the day of the test.  Do not use deodorants, perfumes, lotions, or powders on the day of the test.  Take off any jewelry from your neck.  Wear clothes that you can change into and out of easily. What happens during the procedure?  You will undress from the waist up. You will put on a gown.  You will stand in front of the X-ray machine.  Each breast will be placed  between two plastic or glass plates. The plates will press down on your breast for a few seconds. Try to stay as relaxed as possible. This does not cause any harm to your breasts. Any discomfort you feel will be very brief.  X-rays will be taken from different angles of each breast. The procedure may vary among doctors and hospitals. What happens after the procedure?  The mammogram will be looked at by a specialist (radiologist).  You may need to do certain parts of the test again. This depends on the quality of the images.  Ask when your test results will be ready. Make sure you get your test results.  You may go back to your normal activities. This information is not intended to replace advice given to you by your health care provider. Make sure you discuss any questions you have with your health care provider. Document Released: 04/24/2008 Document Revised: 07/04/2015 Document Reviewed: 04/06/2014 Elsevier Interactive Patient Education  [employer].

## 2017-11-11 ENCOUNTER — Other Ambulatory Visit: Payer: Self-pay | Admitting: Emergency Medicine

## 2017-11-11 DIAGNOSIS — N63 Unspecified lump in unspecified breast: Secondary | ICD-10-CM

## 2017-11-17 ENCOUNTER — Other Ambulatory Visit: Payer: Self-pay | Admitting: Emergency Medicine

## 2017-11-17 ENCOUNTER — Ambulatory Visit
Admission: RE | Admit: 2017-11-17 | Discharge: 2017-11-17 | Disposition: A | Discharge status: Home / Self Care | Payer: Medicare HMO | Source: Ambulatory Visit | Attending: Emergency Medicine | Admitting: Emergency Medicine

## 2017-11-17 DIAGNOSIS — N63 Unspecified lump in unspecified breast: Secondary | ICD-10-CM

## 2017-11-17 DIAGNOSIS — N6489 Other specified disorders of breast: Secondary | ICD-10-CM | POA: Diagnosis not present

## 2017-11-17 DIAGNOSIS — R928 Other abnormal and inconclusive findings on diagnostic imaging of breast: Secondary | ICD-10-CM | POA: Diagnosis not present

## 2017-11-24 ENCOUNTER — Telehealth: Payer: Self-pay | Admitting: Cardiology

## 2017-11-24 ENCOUNTER — Ambulatory Visit (INDEPENDENT_AMBULATORY_CARE_PROVIDER_SITE_OTHER): Payer: Medicare HMO | Admitting: *Deleted

## 2017-11-24 DIAGNOSIS — I495 Sick sinus syndrome: Secondary | ICD-10-CM

## 2017-11-24 NOTE — Telephone Encounter (Signed)
Spoke with pt and reminded pt of remote transmission that is due today. Pt verbalized understanding.   

## 2017-11-25 ENCOUNTER — Encounter: Payer: Self-pay | Admitting: Cardiovascular Disease

## 2017-11-25 DIAGNOSIS — T8549XA Other mechanical complication of breast prosthesis and implant, initial encounter: Secondary | ICD-10-CM | POA: Diagnosis not present

## 2017-11-25 DIAGNOSIS — Z9882 Breast implant status: Secondary | ICD-10-CM | POA: Diagnosis not present

## 2017-11-25 NOTE — Progress Notes (Signed)
Remote pacemaker transmission.   

## 2017-11-26 ENCOUNTER — Encounter: Payer: Self-pay | Admitting: Cardiology

## 2017-12-18 LAB — CUP PACEART REMOTE DEVICE CHECK
Battery Impedance: 400 Ohm
Battery Remaining Longevity: 88 mo
Battery Voltage: 2.78 V
Brady Statistic AP VP Percent: 0 %
Brady Statistic AP VS Percent: 97 %
Brady Statistic AS VP Percent: 0 %
Brady Statistic AS VS Percent: 2 %
Date Time Interrogation Session: 20191016213649
Implantable Lead Implant Date: 20131212
Implantable Lead Implant Date: 20131212
Implantable Lead Location: 753859
Implantable Lead Location: 753860
Implantable Lead Model: 5076
Implantable Lead Model: 5076
Implantable Pulse Generator Implant Date: 20131212
Lead Channel Impedance Value: 392 Ohm
Lead Channel Impedance Value: 501 Ohm
Lead Channel Pacing Threshold Amplitude: 0.625 V
Lead Channel Pacing Threshold Amplitude: 0.625 V
Lead Channel Pacing Threshold Pulse Width: 0.4 ms
Lead Channel Pacing Threshold Pulse Width: 0.4 ms
Lead Channel Setting Pacing Amplitude: 2 V
Lead Channel Setting Pacing Amplitude: 2.5 V
Lead Channel Setting Pacing Pulse Width: 0.4 ms
Lead Channel Setting Sensing Sensitivity: 5.6 mV

## 2017-12-21 ENCOUNTER — Other Ambulatory Visit: Payer: Self-pay

## 2018-01-03 DIAGNOSIS — Z9882 Breast implant status: Secondary | ICD-10-CM | POA: Diagnosis not present

## 2018-01-03 DIAGNOSIS — T8549XD Other mechanical complication of breast prosthesis and implant, subsequent encounter: Secondary | ICD-10-CM | POA: Diagnosis not present

## 2018-01-03 NOTE — H&P (Signed)
Subjective:     Patient ID: [name] is a 77 y.o. female.  Follow-up   Here for follow up discussion prior to removal bilateral breast implants. Underwent subglandular silicone augmentation [id number] Dr. Wendy Poet. No implant information available. MMG/US 03/2017 demonstrated left extracapsular rupture. She presented to PCP 11/2017 with c/o palpable left breast mass. Repeat MMG/US 11/2017 as noted below with again left sided extracapsular rupture. She had fall 2019  with left sided rib fractures, feels this was cause of rupture. Reports since she has had diagnosis rupture notes pain over left breast.  Prior to augmentation A cup,following augmentation C/D cup. Current C cup  PMH includes CAD/MI s/p CABG with subsequent coronary stenting, PVD s/p arterial stenting, CHF, pacemaker, followed by Dr. Sallyanne Kuster Cardiology. History of atrial fibrillation, states resolved with ablation. Was on chronic Plavix- patient herself stopped and is using natural supplement for this.   Patient lives alone, daughter in area.   CLINICAL DATA: Patient complains of a palpable abnormality and pain in the lateral aspect of the left breast.  EXAM: DIGITAL DIAGNOSTIC LEFT MAMMOGRAM WITH IMPLANTS, CAD AND TOMO  ULTRASOUND LEFT BREAST  The patient has retroglandular implants. Standard and implant displaced views were performed.  COMPARISON: Previous exam(s).  ACR Breast Density Category b: There are scattered areas of fibroglandular density.  FINDINGS: No suspicious mass or malignant type microcalcifications identified in the left breast. There is no interval change in the appearance of the breast compared to the prior exam dated 04/06/2017. There is a left retroglandular silicone implant with a heavily calcified capsule. There is a stable contour bulge of the superior aspect the implant as well as medially. Extra capsular silicone is identified both lateral and medial to the implant and  extending to the skin. Spot tangential view shows free silicone at the site of the palpable abnormality.  On physical exam, I palpate nodularity at 2-3 o'clock 7 cm from the nipple.  Targeted ultrasound is performed, showing a snowstorm appearance in the parenchyma of the left breast at 2-3 o'clock 7 cm from the nipple consistent with free silicone. Sonographic evaluation of the left axilla does not show any enlarged adenopathy.  Mammographic images were processed with CAD.  IMPRESSION: No evidence of malignancy in the left breast. There is extra capsular rupture of the left breast silicone implant with free silicone extending laterally and medially and accounting for the palpable abnormality at 3 o'clock.  RECOMMENDATION: Surgical consultation for possible implant removal is recommended. Routine annual screening mammogram in February of 2020 is recommended.  I have discussed the findings and recommendations with the patient. Results were also provided in writing at the conclusion of the visit. If applicable, a reminder letter will be sent to the patient regarding the next appointment.  BI-RADS CATEGORY 2: Benign.   Electronically Signed By: Lillia Mountain M.D. On: 11/17/2017 14:56   Review of Systems     Objective:   Physical Exam  Cardiovascular: Normal rate, regular rhythm and normal heart sounds.  Pulmonary/Chest: Effort normal and breath sounds normal.  Lymphadenopathy:    She has no axillary adenopathy.   Soft bilateral, 5 cm soft tissue pinch over implants Left lateral breast with some nodularity at 3 o clock Grade 2/3 ptosis bilateral   Sn to nipple R 25 L 25 cm BW R 18 L 18 cm Nipple to IMF R 9  L 10 cm Assessment:     Left extracapsular rupture History augmentation mammaplasty Left breast pain    Plan:  Plan removal bilateral implants, left capsulectomy, possible right capsulectomy.  Her Cardiologist Dr. Sallyanne Kuster does not  recommend additional cardiac work up. Recommend ASA 81 mg, do not stop dofetilide. She may continue her ASA to surgery time given her history.  I recommend she remove implants and would plan capsulectomy on left to aid with removal silicone. I recommend removal as the silicone extracapsular will form granulomas and will produce lumps in breast, will be difficult to follow from cancer standpoint. I would recommend removal alone to limit her anesthesia time. Over right, counseled imaging to date has not shown rupture but given time she has had it is certainly possible Reviewed if thick capsule noted intra op, or rupture or textured implant then may proceed with capsulectomy on this side as well. She also has ptosis- I do not recommend any mastopexy at this tim in order to limit her surgical risk.  Patient states she has been doing a lot of her own research on line and about celebrities that have had implants removed. In fact she decided not to proceed with implant replacement after watching episode Dr. Abbe Amsterdam.  Rx for Tramadol given.  Plan OP surgery, reviewed will have drains.   Irene Limbo, MD [facility] Plastic & Reconstructive Surgery [phone], pin [phone]

## 2018-01-10 NOTE — Progress Notes (Signed)
Patient's chart and all cardiac notes reviewed with Dr Sabra Heck, [place] for [facility].

## 2018-01-11 ENCOUNTER — Encounter (HOSPITAL_BASED_OUTPATIENT_CLINIC_OR_DEPARTMENT_OTHER): Payer: Self-pay | Admitting: *Deleted

## 2018-01-11 ENCOUNTER — Other Ambulatory Visit: Payer: Self-pay

## 2018-01-13 ENCOUNTER — Encounter (HOSPITAL_BASED_OUTPATIENT_CLINIC_OR_DEPARTMENT_OTHER)
Admission: RE | Admit: 2018-01-13 | Discharge: 2018-01-13 | Disposition: A | Discharge status: Home / Self Care | Payer: Medicare HMO | Source: Ambulatory Visit | Attending: Plastic Surgery | Admitting: Plastic Surgery

## 2018-01-13 DIAGNOSIS — Z01818 Encounter for other preprocedural examination: Secondary | ICD-10-CM | POA: Insufficient documentation

## 2018-01-13 DIAGNOSIS — F419 Anxiety disorder, unspecified: Secondary | ICD-10-CM | POA: Diagnosis not present

## 2018-01-13 DIAGNOSIS — I252 Old myocardial infarction: Secondary | ICD-10-CM | POA: Diagnosis not present

## 2018-01-13 DIAGNOSIS — I10 Essential (primary) hypertension: Secondary | ICD-10-CM | POA: Diagnosis not present

## 2018-01-13 DIAGNOSIS — E039 Hypothyroidism, unspecified: Secondary | ICD-10-CM | POA: Diagnosis not present

## 2018-01-13 DIAGNOSIS — Z79899 Other long term (current) drug therapy: Secondary | ICD-10-CM | POA: Diagnosis not present

## 2018-01-13 DIAGNOSIS — F329 Major depressive disorder, single episode, unspecified: Secondary | ICD-10-CM | POA: Diagnosis not present

## 2018-01-13 DIAGNOSIS — I4891 Unspecified atrial fibrillation: Secondary | ICD-10-CM | POA: Diagnosis not present

## 2018-01-13 DIAGNOSIS — Z951 Presence of aortocoronary bypass graft: Secondary | ICD-10-CM | POA: Diagnosis not present

## 2018-01-13 DIAGNOSIS — I2510 Atherosclerotic heart disease of native coronary artery without angina pectoris: Secondary | ICD-10-CM | POA: Diagnosis not present

## 2018-01-13 DIAGNOSIS — Z87891 Personal history of nicotine dependence: Secondary | ICD-10-CM | POA: Diagnosis not present

## 2018-01-13 DIAGNOSIS — T8579XA Infection and inflammatory reaction due to other internal prosthetic devices, implants and grafts, initial encounter: Secondary | ICD-10-CM | POA: Diagnosis not present

## 2018-01-13 DIAGNOSIS — N6481 Ptosis of breast: Secondary | ICD-10-CM | POA: Diagnosis not present

## 2018-01-13 DIAGNOSIS — Y811 Therapeutic (nonsurgical) and rehabilitative general- and plastic-surgery devices associated with adverse incidents: Secondary | ICD-10-CM | POA: Diagnosis not present

## 2018-01-13 DIAGNOSIS — R69 Illness, unspecified: Secondary | ICD-10-CM | POA: Diagnosis not present

## 2018-01-13 DIAGNOSIS — Z955 Presence of coronary angioplasty implant and graft: Secondary | ICD-10-CM | POA: Diagnosis not present

## 2018-01-13 DIAGNOSIS — Z853 Personal history of malignant neoplasm of breast: Secondary | ICD-10-CM | POA: Diagnosis not present

## 2018-01-13 DIAGNOSIS — N644 Mastodynia: Secondary | ICD-10-CM | POA: Diagnosis present

## 2018-01-13 LAB — BASIC METABOLIC PANEL
Anion gap: 10 (ref 5–15)
BUN: 17 mg/dL (ref 8–23)
CO2: 26 mmol/L (ref 22–32)
Calcium: 9.2 mg/dL (ref 8.9–10.3)
Chloride: 102 mmol/L (ref 98–111)
Creatinine, Ser: 0.89 mg/dL (ref 0.44–1.00)
GFR calc Af Amer: >60 mL/min (ref >60)
GFR calc non Af Amer: >60 mL/min (ref >60)
Glucose, Bld: 117 mg/dL — ABNORMAL HIGH (ref 70–99)
Potassium: 4.5 mmol/L (ref 3.5–5.1)
Sodium: 138 mmol/L (ref 135–145)

## 2018-01-13 LAB — HEMOGLOBIN AND HEMATOCRIT, BLOOD
HCT: 43.7 % (ref 36.0–46.0)
Hemoglobin: 14.1 g/dL (ref 12.0–15.0)

## 2018-01-13 NOTE — Progress Notes (Signed)
Ensure pre surgery drink given with instructions to complete by 0715 dos, surgical soap given with instructions, pt verbalized understanding. 

## 2018-01-14 ENCOUNTER — Encounter (HOSPITAL_BASED_OUTPATIENT_CLINIC_OR_DEPARTMENT_OTHER): Payer: Self-pay | Admitting: Anesthesiology

## 2018-01-14 ENCOUNTER — Ambulatory Visit (HOSPITAL_BASED_OUTPATIENT_CLINIC_OR_DEPARTMENT_OTHER)
Admission: RE | Admit: 2018-01-14 | Discharge: 2018-01-14 | Disposition: A | Discharge status: Home / Self Care | Payer: Medicare HMO | Source: Ambulatory Visit | Attending: Plastic Surgery | Admitting: Plastic Surgery

## 2018-01-14 ENCOUNTER — Encounter (HOSPITAL_BASED_OUTPATIENT_CLINIC_OR_DEPARTMENT_OTHER)
Admission: RE | Disposition: A | Discharge status: Home / Self Care | Payer: Self-pay | Source: Ambulatory Visit | Attending: Plastic Surgery

## 2018-01-14 ENCOUNTER — Ambulatory Visit (HOSPITAL_BASED_OUTPATIENT_CLINIC_OR_DEPARTMENT_OTHER): Payer: Medicare HMO | Admitting: Anesthesiology

## 2018-01-14 DIAGNOSIS — Z853 Personal history of malignant neoplasm of breast: Secondary | ICD-10-CM | POA: Insufficient documentation

## 2018-01-14 DIAGNOSIS — N644 Mastodynia: Secondary | ICD-10-CM | POA: Diagnosis not present

## 2018-01-14 DIAGNOSIS — N182 Chronic kidney disease, stage 2 (mild): Secondary | ICD-10-CM | POA: Diagnosis not present

## 2018-01-14 DIAGNOSIS — I2510 Atherosclerotic heart disease of native coronary artery without angina pectoris: Secondary | ICD-10-CM | POA: Insufficient documentation

## 2018-01-14 DIAGNOSIS — F329 Major depressive disorder, single episode, unspecified: Secondary | ICD-10-CM | POA: Insufficient documentation

## 2018-01-14 DIAGNOSIS — I5032 Chronic diastolic (congestive) heart failure: Secondary | ICD-10-CM | POA: Diagnosis not present

## 2018-01-14 DIAGNOSIS — T85898A Other specified complication of other internal prosthetic devices, implants and grafts, initial encounter: Secondary | ICD-10-CM | POA: Diagnosis not present

## 2018-01-14 DIAGNOSIS — Z951 Presence of aortocoronary bypass graft: Secondary | ICD-10-CM | POA: Insufficient documentation

## 2018-01-14 DIAGNOSIS — Z87891 Personal history of nicotine dependence: Secondary | ICD-10-CM | POA: Insufficient documentation

## 2018-01-14 DIAGNOSIS — I4891 Unspecified atrial fibrillation: Secondary | ICD-10-CM | POA: Diagnosis not present

## 2018-01-14 DIAGNOSIS — F419 Anxiety disorder, unspecified: Secondary | ICD-10-CM | POA: Insufficient documentation

## 2018-01-14 DIAGNOSIS — T8549XA Other mechanical complication of breast prosthesis and implant, initial encounter: Secondary | ICD-10-CM | POA: Diagnosis not present

## 2018-01-14 DIAGNOSIS — R69 Illness, unspecified: Secondary | ICD-10-CM | POA: Diagnosis not present

## 2018-01-14 DIAGNOSIS — I252 Old myocardial infarction: Secondary | ICD-10-CM | POA: Diagnosis not present

## 2018-01-14 DIAGNOSIS — Y811 Therapeutic (nonsurgical) and rehabilitative general- and plastic-surgery devices associated with adverse incidents: Secondary | ICD-10-CM | POA: Insufficient documentation

## 2018-01-14 DIAGNOSIS — N6481 Ptosis of breast: Secondary | ICD-10-CM | POA: Insufficient documentation

## 2018-01-14 DIAGNOSIS — T8543XA Leakage of breast prosthesis and implant, initial encounter: Secondary | ICD-10-CM | POA: Diagnosis not present

## 2018-01-14 DIAGNOSIS — I10 Essential (primary) hypertension: Secondary | ICD-10-CM | POA: Diagnosis not present

## 2018-01-14 DIAGNOSIS — E039 Hypothyroidism, unspecified: Secondary | ICD-10-CM | POA: Insufficient documentation

## 2018-01-14 DIAGNOSIS — I130 Hypertensive heart and chronic kidney disease with heart failure and stage 1 through stage 4 chronic kidney disease, or unspecified chronic kidney disease: Secondary | ICD-10-CM | POA: Diagnosis not present

## 2018-01-14 DIAGNOSIS — T8579XA Infection and inflammatory reaction due to other internal prosthetic devices, implants and grafts, initial encounter: Secondary | ICD-10-CM | POA: Diagnosis not present

## 2018-01-14 DIAGNOSIS — Z79899 Other long term (current) drug therapy: Secondary | ICD-10-CM | POA: Insufficient documentation

## 2018-01-14 DIAGNOSIS — Z955 Presence of coronary angioplasty implant and graft: Secondary | ICD-10-CM | POA: Insufficient documentation

## 2018-01-14 HISTORY — PX: BREAST IMPLANT REMOVAL: SHX5361

## 2018-01-14 HISTORY — DX: Leakage of breast prosthesis and implant, initial encounter: T85.43XA

## 2018-01-14 HISTORY — PX: CAPSULECTOMY: SHX5381

## 2018-01-14 HISTORY — DX: Hypothyroidism, unspecified: E03.9

## 2018-01-14 SURGERY — REMOVAL, IMPLANT, BREAST
Anesthesia: General | Site: Breast | Laterality: Bilateral

## 2018-01-14 MED ORDER — GABAPENTIN 300 MG PO CAPS
ORAL_CAPSULE | ORAL | Status: AC
  Filled 2018-01-14: qty 1

## 2018-01-14 MED ORDER — ACETAMINOPHEN 500 MG PO TABS
ORAL_TABLET | ORAL | Status: AC
  Filled 2018-01-14: qty 2

## 2018-01-14 MED ORDER — CHLORHEXIDINE GLUCONATE CLOTH 2 % EX PADS: 6.0000 | MEDICATED_PAD | Freq: Once | CUTANEOUS | Status: DC

## 2018-01-14 MED ORDER — EPHEDRINE 5 MG/ML INJ
INTRAVENOUS | Status: AC
  Filled 2018-01-14: qty 10

## 2018-01-14 MED ORDER — DEXAMETHASONE SODIUM PHOSPHATE 4 MG/ML IJ SOLN
INTRAMUSCULAR | Status: DC | PRN
  Administered 2018-01-14: 10 mg via INTRAVENOUS

## 2018-01-14 MED ORDER — SCOPOLAMINE 1 MG/3DAYS TD PT72: 1.0000 | MEDICATED_PATCH | Freq: Once | TRANSDERMAL | Status: DC | PRN

## 2018-01-14 MED ORDER — MIDAZOLAM HCL 2 MG/2ML IJ SOLN: 0.5000 mg | Freq: Once | INTRAMUSCULAR | Status: DC | PRN

## 2018-01-14 MED ORDER — LACTATED RINGERS IV SOLN
INTRAVENOUS | Status: DC
  Administered 2018-01-14 (×2): via INTRAVENOUS

## 2018-01-14 MED ORDER — ACETAMINOPHEN 500 MG PO TABS
1000.0000 mg | ORAL_TABLET | ORAL | Status: AC
  Administered 2018-01-14: 1000 mg via ORAL

## 2018-01-14 MED ORDER — 0.9 % SODIUM CHLORIDE (POUR BTL) OPTIME
TOPICAL | Status: DC | PRN
  Administered 2018-01-14: 2000 mL

## 2018-01-14 MED ORDER — SUCCINYLCHOLINE CHLORIDE 200 MG/10ML IV SOSY
PREFILLED_SYRINGE | INTRAVENOUS | Status: AC
  Filled 2018-01-14: qty 10

## 2018-01-14 MED ORDER — BUPIVACAINE LIPOSOME 1.3 % IJ SUSP
INTRAMUSCULAR | Status: AC
  Filled 2018-01-14: qty 20

## 2018-01-14 MED ORDER — DEXAMETHASONE SODIUM PHOSPHATE 10 MG/ML IJ SOLN
INTRAMUSCULAR | Status: AC
  Filled 2018-01-14: qty 1

## 2018-01-14 MED ORDER — OXYCODONE HCL 5 MG PO TABS
ORAL_TABLET | ORAL | Status: AC
  Filled 2018-01-14: qty 1

## 2018-01-14 MED ORDER — MEPERIDINE HCL 25 MG/ML IJ SOLN: 6.2500 mg | INTRAMUSCULAR | Status: DC | PRN

## 2018-01-14 MED ORDER — MIDAZOLAM HCL 2 MG/2ML IJ SOLN
INTRAMUSCULAR | Status: AC
  Filled 2018-01-14: qty 2

## 2018-01-14 MED ORDER — PROMETHAZINE HCL 25 MG/ML IJ SOLN: 6.2500 mg | INTRAMUSCULAR | Status: DC | PRN

## 2018-01-14 MED ORDER — ONDANSETRON HCL 4 MG/2ML IJ SOLN
INTRAMUSCULAR | Status: DC | PRN
  Administered 2018-01-14: 4 mg via INTRAVENOUS

## 2018-01-14 MED ORDER — CEFAZOLIN SODIUM-DEXTROSE 2-4 GM/100ML-% IV SOLN
INTRAVENOUS | Status: AC
  Filled 2018-01-14: qty 100

## 2018-01-14 MED ORDER — PROPOFOL 10 MG/ML IV BOLUS
INTRAVENOUS | Status: DC | PRN
  Administered 2018-01-14: 150 mg via INTRAVENOUS

## 2018-01-14 MED ORDER — LIDOCAINE HCL (CARDIAC) PF 100 MG/5ML IV SOSY
PREFILLED_SYRINGE | INTRAVENOUS | Status: DC | PRN
  Administered 2018-01-14: 40 mg via INTRAVENOUS

## 2018-01-14 MED ORDER — ONDANSETRON HCL 4 MG/2ML IJ SOLN
INTRAMUSCULAR | Status: AC
  Filled 2018-01-14: qty 2

## 2018-01-14 MED ORDER — GABAPENTIN 300 MG PO CAPS
300.0000 mg | ORAL_CAPSULE | ORAL | Status: AC
  Administered 2018-01-14: 300 mg via ORAL

## 2018-01-14 MED ORDER — LIDOCAINE 2% (20 MG/ML) 5 ML SYRINGE
INTRAMUSCULAR | Status: AC
  Filled 2018-01-14: qty 5

## 2018-01-14 MED ORDER — MIDAZOLAM HCL 2 MG/2ML IJ SOLN
1.0000 mg | INTRAMUSCULAR | Status: DC | PRN
  Administered 2018-01-14: 1 mg via INTRAVENOUS

## 2018-01-14 MED ORDER — OXYCODONE HCL 5 MG PO TABS
5.0000 mg | ORAL_TABLET | Freq: Once | ORAL | Status: AC
  Administered 2018-01-14: 5 mg via ORAL

## 2018-01-14 MED ORDER — SODIUM CHLORIDE 0.9 % IV SOLN
INTRAVENOUS | Status: DC | PRN
  Administered 2018-01-14: 40 mL

## 2018-01-14 MED ORDER — CEFAZOLIN SODIUM-DEXTROSE 2-4 GM/100ML-% IV SOLN: 2.0000 g | INTRAVENOUS | Status: DC

## 2018-01-14 MED ORDER — FENTANYL CITRATE (PF) 100 MCG/2ML IJ SOLN
INTRAMUSCULAR | Status: AC
  Filled 2018-01-14: qty 2

## 2018-01-14 MED ORDER — PHENYLEPHRINE 40 MCG/ML (10ML) SYRINGE FOR IV PUSH (FOR BLOOD PRESSURE SUPPORT)
PREFILLED_SYRINGE | INTRAVENOUS | Status: AC
  Filled 2018-01-14: qty 10

## 2018-01-14 MED ORDER — FENTANYL CITRATE (PF) 100 MCG/2ML IJ SOLN
50.0000 ug | INTRAMUSCULAR | Status: DC | PRN
  Administered 2018-01-14: 100 ug via INTRAVENOUS

## 2018-01-14 MED ORDER — SUCCINYLCHOLINE CHLORIDE 20 MG/ML IJ SOLN
INTRAMUSCULAR | Status: DC | PRN
  Administered 2018-01-14: 100 mg via INTRAVENOUS

## 2018-01-14 MED ORDER — FENTANYL CITRATE (PF) 100 MCG/2ML IJ SOLN
25.0000 ug | INTRAMUSCULAR | Status: DC | PRN
  Administered 2018-01-14 (×2): 50 ug via INTRAVENOUS

## 2018-01-14 MED ORDER — SODIUM CHLORIDE (PF) 0.9 % IJ SOLN
INTRAMUSCULAR | Status: AC
  Filled 2018-01-14: qty 20

## 2018-01-14 SURGICAL SUPPLY — 67 items
BAG DECANTER FOR FLEXI CONT (MISCELLANEOUS) ×2 IMPLANT
BINDER BREAST LRG (GAUZE/BANDAGES/DRESSINGS) ×2 IMPLANT
BINDER BREAST XLRG (GAUZE/BANDAGES/DRESSINGS) IMPLANT
BINDER BREAST XXLRG (GAUZE/BANDAGES/DRESSINGS) IMPLANT
BLADE SURG 10 STRL SS (BLADE) ×4 IMPLANT
BLADE SURG 15 STRL LF DISP TIS (BLADE) IMPLANT
BLADE SURG 15 STRL SS (BLADE)
BNDG GAUZE ELAST 4 BULKY (GAUZE/BANDAGES/DRESSINGS) IMPLANT
CANISTER SUCT 1200ML W/VALVE (MISCELLANEOUS) ×4 IMPLANT
CHLORAPREP W/TINT 26ML (MISCELLANEOUS) ×4 IMPLANT
COVER BACK TABLE 60X90IN (DRAPES) ×2 IMPLANT
COVER MAYO STAND STRL (DRAPES) ×2 IMPLANT
COVER WAND RF STERILE (DRAPES) IMPLANT
DECANTER SPIKE VIAL GLASS SM (MISCELLANEOUS) IMPLANT
DERMABOND ADVANCED (GAUZE/BANDAGES/DRESSINGS) ×2
DERMABOND ADVANCED .7 DNX12 (GAUZE/BANDAGES/DRESSINGS) ×2 IMPLANT
DRAIN CHANNEL 15F RND FF W/TCR (WOUND CARE) ×4 IMPLANT
DRAPE INCISE IOBAN 66X45 STRL (DRAPES) ×2 IMPLANT
DRAPE TOP ARMCOVERS (MISCELLANEOUS) ×2 IMPLANT
DRAPE U-SHAPE 76X120 STRL (DRAPES) ×2 IMPLANT
DRSG PAD ABDOMINAL 8X10 ST (GAUZE/BANDAGES/DRESSINGS) ×4 IMPLANT
ELECT BLADE 4.0 EZ CLEAN MEGAD (MISCELLANEOUS) ×2
ELECT BLADE 6.5 EXT (BLADE) IMPLANT
ELECT COATED BLADE 2.86 ST (ELECTRODE) ×2 IMPLANT
ELECT REM PT RETURN 9FT ADLT (ELECTROSURGICAL) ×2
ELECTRODE BLDE 4.0 EZ CLN MEGD (MISCELLANEOUS) ×1 IMPLANT
ELECTRODE REM PT RTRN 9FT ADLT (ELECTROSURGICAL) ×1 IMPLANT
EVACUATOR SILICONE 100CC (DRAIN) ×4 IMPLANT
GAUZE SPONGE 4X4 12PLY STRL (GAUZE/BANDAGES/DRESSINGS) IMPLANT
GAUZE SPONGE 4X4 12PLY STRL LF (GAUZE/BANDAGES/DRESSINGS) IMPLANT
GLOVE BIO SURGEON STRL SZ 6 (GLOVE) ×2 IMPLANT
GLOVE BIOGEL M STRL SZ7.5 (GLOVE) ×2 IMPLANT
GLOVE BIOGEL PI IND STRL 8 (GLOVE) ×1 IMPLANT
GLOVE BIOGEL PI INDICATOR 8 (GLOVE) ×1
GLOVE EXAM NITRILE MD LF STRL (GLOVE) ×2 IMPLANT
GOWN STRL REUS W/ TWL LRG LVL3 (GOWN DISPOSABLE) ×1 IMPLANT
GOWN STRL REUS W/ TWL XL LVL3 (GOWN DISPOSABLE) ×1 IMPLANT
GOWN STRL REUS W/TWL LRG LVL3 (GOWN DISPOSABLE) ×1
GOWN STRL REUS W/TWL XL LVL3 (GOWN DISPOSABLE) ×1
MARKER SKIN DUAL TIP RULER LAB (MISCELLANEOUS) IMPLANT
NEEDLE HYPO 25X1 1.5 SAFETY (NEEDLE) ×2 IMPLANT
NS IRRIG 1000ML POUR BTL (IV SOLUTION) ×6 IMPLANT
PACK BASIN DAY SURGERY FS (CUSTOM PROCEDURE TRAY) ×2 IMPLANT
PENCIL BUTTON HOLSTER BLD 10FT (ELECTRODE) ×2 IMPLANT
PIN SAFETY STERILE (MISCELLANEOUS) ×2 IMPLANT
SHEET MEDIUM DRAPE 40X70 STRL (DRAPES) ×2 IMPLANT
SLEEVE SCD COMPRESS KNEE MED (MISCELLANEOUS) ×2 IMPLANT
SPONGE LAP 18X18 RF (DISPOSABLE) ×4 IMPLANT
STAPLER VISISTAT 35W (STAPLE) ×2 IMPLANT
STRIP CLOSURE SKIN 1/2X4 (GAUZE/BANDAGES/DRESSINGS) IMPLANT
SUT ETHILON 2 0 FS 18 (SUTURE) ×2 IMPLANT
SUT MNCRL AB 4-0 PS2 18 (SUTURE) ×2 IMPLANT
SUT SILK 2 0 PERMA HAND 18 BK (SUTURE) ×2 IMPLANT
SUT VIC AB 3-0 PS1 18 (SUTURE) ×1
SUT VIC AB 3-0 PS1 18XBRD (SUTURE) ×1 IMPLANT
SUT VIC AB 3-0 SH 27 (SUTURE)
SUT VIC AB 3-0 SH 27X BRD (SUTURE) IMPLANT
SUT VICRYL 4-0 PS2 18IN ABS (SUTURE) ×2 IMPLANT
SWAB COLLECTION DEVICE MRSA (MISCELLANEOUS) IMPLANT
SWAB CULTURE ESWAB REG 1ML (MISCELLANEOUS) IMPLANT
SYR 50ML LL SCALE MARK (SYRINGE) IMPLANT
SYR BULB IRRIGATION 50ML (SYRINGE) ×2 IMPLANT
SYR CONTROL 10ML LL (SYRINGE) ×2 IMPLANT
TOWEL GREEN STERILE FF (TOWEL DISPOSABLE) ×2 IMPLANT
TUBE CONNECTING 20X1/4 (TUBING) ×4 IMPLANT
UNDERPAD 30X30 (UNDERPADS AND DIAPERS) ×4 IMPLANT
YANKAUER SUCT BULB TIP NO VENT (SUCTIONS) ×2 IMPLANT

## 2018-01-14 NOTE — Anesthesia Preprocedure Evaluation (Addendum)
Anesthesia Evaluation  Patient identified by MRN, date of birth, ID band Patient awake    Reviewed: Allergy & Precautions, NPO status , Patient's Chart, lab work & pertinent test results  History of Anesthesia Complications Negative for: history of anesthetic complications  Airway Mallampati: I  TM Distance: >3 FB Neck ROM: Full    Dental  (+) Implants, Dental Advisory Given, Caps   Pulmonary former smoker (quit 2008),    breath sounds clear to auscultation       Cardiovascular hypertension, Pt. on medications (-) angina+ CAD, + Past MI, + Cardiac Stents, + CABG and + Peripheral Vascular Disease (SFA stent)  + dysrhythmias Atrial Fibrillation + pacemaker (2013 for symptomatic sinus bradycardia, not pacer dependent)  Rhythm:Regular Rate:Normal  '18 ECHO: EF 55-60% mild AI, mild TR   Neuro/Psych Anxiety Depression    GI/Hepatic negative GI ROS, (+)     substance abuse  alcohol use,   Endo/Other  Hypothyroidism   Renal/GU Renal InsufficiencyRenal disease     Musculoskeletal   Abdominal   Peds  Hematology negative hematology ROS (+)   Anesthesia Other Findings   Reproductive/Obstetrics                            Anesthesia Physical Anesthesia Plan  ASA: III  Anesthesia Plan: General   Post-op Pain Management:    Induction: Intravenous  PONV Risk Score and Plan: 3 and Ondansetron and Dexamethasone  Airway Management Planned: Oral ETT  Additional Equipment:   Intra-op Plan:   Post-operative Plan: Extubation in OR  Informed Consent: I have reviewed the patients History and Physical, chart, labs and discussed the procedure including the risks, benefits and alternatives for the proposed anesthesia with the patient or authorized representative who has indicated his/her understanding and acceptance.   Dental advisory given  Plan Discussed with: Surgeon and CRNA  Anesthesia Plan  Comments: (Plan routine monitors, GETA  Pacemaker, breast surgery, surgeon to use sterile magnet (asynch rate 85) )        Anesthesia Quick Evaluation

## 2018-01-14 NOTE — Discharge Instructions (Signed)
May take pain pill at home if needed at [place] this sheet to all of your post-operative appointments while you have your drains.  Please measure your drains by CC's or ML's.  Make sure you drain and measure your JP Drains 2 or 3 times per day.  At the end of each day, add up totals for the left side and add up totals for the right side.    ( 9 am )     ( 3 pm )        ( 9 pm )                Date L  R  L  R  L  R  Total L/R                                                                                                                                                                                           Post Anesthesia Home Care Instructions  Activity: Get plenty of rest for the remainder of the day. A responsible individual must stay with you for 24 hours following the procedure.  For the next 24 hours, DO NOT: -Drive a car -Paediatric nurse -Drink alcoholic beverages -Take any medication unless instructed by your physician -Make any legal decisions or sign important papers.  Meals: Start with liquid foods such as gelatin or soup. Progress to regular foods as tolerated. Avoid greasy, spicy, heavy foods. If nausea and/or vomiting occur, drink only clear liquids until the nausea and/or vomiting subsides. Call your physician if vomiting continues.  Special Instructions/Symptoms: Your throat may feel dry or sore from the anesthesia or the breathing tube placed in your throat during surgery. If this causes discomfort, gargle with warm salt water. The discomfort should disappear within 24 hours.  If you had a scopolamine patch placed behind your ear for the management of post- operative nausea and/or vomiting:  1. The medication in the patch is effective for 72 hours, after which it should be removed.  Wrap patch in a tissue and discard in the trash. Wash hands thoroughly with soap and water. 2. You may remove the patch earlier than 72 hours if  you experience unpleasant side effects which may include dry mouth, dizziness or visual disturbances. 3. Avoid touching the patch. Wash your hands with soap and water after contact with the patch.     Information for Discharge Teaching: EXPAREL (bupivacaine liposome injectable suspension)   Your surgeon or anesthesiologist gave you EXPAREL(bupivacaine) to help control your pain after surgery.   EXPAREL is a local anesthetic that  provides pain relief by numbing the tissue around the surgical site.  EXPAREL is designed to release pain medication over time and can control pain for up to 72 hours.  Depending on how you respond to EXPAREL, you may require less pain medication during your recovery.  Possible side effects:  Temporary loss of sensation or ability to move in the area where bupivacaine was injected.  Nausea, vomiting, constipation  Rarely, numbness and tingling in your mouth or lips, lightheadedness, or anxiety may occur.  Call your doctor right away if you think you may be experiencing any of these sensations, or if you have other questions regarding possible side effects.  Follow all other discharge instructions given to you by your surgeon or nurse. Eat a healthy diet and drink plenty of water or other fluids.  If you return to the hospital for any reason within 96 hours following the administration of EXPAREL, it is important for health care providers to know that you have received this anesthetic. A teal colored band has been placed on your arm with the date, time and amount of EXPAREL you have received in order to alert and inform your health care providers. Please leave this armband in place for the full 96 hours following administration, and then you may remove the band.

## 2018-01-14 NOTE — Op Note (Signed)
Operative Note   DATE OF OPERATION: 12.6.2019  LOCATION: [facility]-outpatient  SURGICAL DIVISION: Plastic Surgery  PREOPERATIVE DIAGNOSES:  1. History augmentation mammaplasty 2. Left breast extracapsular rupture implant   POSTOPERATIVE DIAGNOSES:  1. History augmentation mammaplasty 2. Left breast extracapsular rupture implant 3. Right breast intracapsular rupture implant  PROCEDURE: 1. Removal right breast implant 2. Removal left breast implant and implant material 3. Bilateral capsulectomies  SURGEON: Irene Limbo MD MBA  ASSISTANT: none  ANESTHESIA:  General.   EBL: 10 ml  COMPLICATIONS: None immediate.   INDICATIONS FOR PROCEDURE:  The patient, [name], is a 77 y.o. female born on 06/14/1940, is here for removal bilateral silicone subglandular breast implants. From imaging she has known left extracapsular rupture.   FINDINGS: Bilateral thick calcified capsule. No implant stamp on either implant. Intracapsular rupture on right and extracapsular rupture on left.  DESCRIPTION OF PROCEDURE:  The patient's operative site was marked with the patient in the preoperative area. The patient was taken to the operating room. SCDs were placed and IV antibiotics were given. The patient's operative site was prepped and draped in a sterile fashion. A time out was performed and all information was confirmed to be correct.  Sterile magnet applied over left chest pacemaker and secured to skin with 2-0 nylon. Incision made in left inframammary fold and carried through superficial fascia and breast tissue to implant capsule. Dissection completed around capsule circumferentially. Gross extracapsular rupture encountered and silicone free and implant shell removed. Entirety capsule excised. Breast irrigated with liter saline. Hemostasis obtained. Exparel infiltrated. 15 Fr JP placed in breast cavity and secured with 2-0 nylon. Closure completed with 3-0 vicryl in superficial fascia, 4-0  vicryl in dermis, 4-0 monocryl subcuticular skin closure.  Incision then made in right inframammary fold. This was carried through superficial fascia and breast tissue to implant capsule. Dissection completed around capsule circumferentially. En bloc capsulectomy performed. Breast irrigated with saline. Hemostasis ensured. Exparel infiltrated. 15 Fr JP placed in breast cavity and secured with 2-0 nylon. Closure completed with 3-0 vicryl in superficial fascia, 4-0 vicryl in dermis, 4-0 monocryl subcuticular skin closure.  Magnet removed from over pacemaker. Dermabond applied to incisions followed by dry dressing, breast binder.  On back table, capsule incised over right and significant gel bleed and intracapsular rupture noted.   The patient was allowed to wake from anesthesia, extubated and taken to the recovery room in satisfactory condition.   SPECIMENS: right and left breast capsule, implant  DRAINS: 15 Fr JP in right and left breast  Irene Limbo, MD [facility] Plastic & Reconstructive Surgery [phone], pin [phone]

## 2018-01-14 NOTE — Transfer of Care (Signed)
Immediate Anesthesia Transfer of Care Note  Patient: [name]  Procedure(s) Performed: REMOVAL BILATERAL BREAST IMPLANTS (Bilateral Breast) CAPSULECTOMY (Bilateral Breast)  Patient Location: PACU  Anesthesia Type:General  Level of Consciousness: awake, alert  and oriented  Airway & Oxygen Therapy: Patient Spontanous Breathing and Patient connected to face mask oxygen  Post-op Assessment: Report given to RN and Post -op Vital signs reviewed and stable  Post vital signs: Reviewed and stable  Last Vitals:  Vitals Value Taken Time  BP    Temp    Pulse 81 01/14/2018 12:50 PM  Resp    SpO2 100 % 01/14/2018 12:50 PM  Vitals shown include unvalidated device data.  Last Pain:  Vitals:   01/14/18 0949  TempSrc: Oral  PainSc: 0-No pain      Patients Stated Pain Goal: 2 (17/61/60 7371)  Complications: No apparent anesthesia complications

## 2018-01-14 NOTE — Anesthesia Postprocedure Evaluation (Signed)
Anesthesia Post Note  Patient: [name]  Procedure(s) Performed: REMOVAL BILATERAL BREAST IMPLANTS (Bilateral Breast) CAPSULECTOMY (Bilateral Breast)     Patient location during evaluation: PACU Anesthesia Type: General Level of consciousness: awake and alert, oriented and patient cooperative Pain management: pain level controlled Vital Signs Assessment: post-procedure vital signs reviewed and stable Respiratory status: spontaneous breathing, nonlabored ventilation and respiratory function stable Cardiovascular status: blood pressure returned to baseline and stable Postop Assessment: no apparent nausea or vomiting Anesthetic complications: no    Last Vitals:  Vitals:   01/14/18 1330 01/14/18 1345  BP: 113/65 108/70  Pulse: 75 75  Resp: 10 16  Temp:    SpO2: 100% (!) 87%    Last Pain:  Vitals:   01/14/18 1330  TempSrc:   PainSc: 3                  Estefanny Moler,E. Abraham Margulies

## 2018-01-14 NOTE — Interval H&P Note (Signed)
History and Physical Interval Note:  01/14/2018 9:46 AM  [name]  has presented today for surgery, with the diagnosis of mechanical complication breast left, history augmentation mammaplasty, breast pain left  The various methods of treatment have been discussed with the patient and family. After consideration of risks, benefits and other options for treatment, the patient has consented to  Procedure(s): REMOVAL BILATERAL BREAST IMPLANTS (Bilateral) CAPSULECTOMY (Bilateral) as a surgical intervention .  The patient's history has been reviewed, patient examined, no change in status, stable for surgery.  I have reviewed the patient's chart and labs.  Questions were answered to the patient's satisfaction.     Arnoldo Hooker [name]

## 2018-01-14 NOTE — Anesthesia Procedure Notes (Signed)
Procedure Name: Intubation Date/Time: 01/14/2018 10:43 AM Performed by: Willa Frater, CRNA Pre-anesthesia Checklist: Patient identified, Emergency Drugs available, Suction available and Patient being monitored Patient Re-evaluated:Patient Re-evaluated prior to induction Oxygen Delivery Method: Circle system utilized Preoxygenation: Pre-oxygenation with 100% oxygen Induction Type: IV induction Ventilation: Mask ventilation without difficulty Laryngoscope Size: Mac and 3 Grade View: Grade I Tube type: Oral Number of attempts: 1 Airway Equipment and Method: Stylet and Oral airway Placement Confirmation: ETT inserted through vocal cords under direct vision,  positive ETCO2 and breath sounds checked- equal and bilateral Secured at: 22 cm Tube secured with: Tape Dental Injury: Teeth and Oropharynx as per pre-operative assessment

## 2018-01-17 ENCOUNTER — Encounter (HOSPITAL_BASED_OUTPATIENT_CLINIC_OR_DEPARTMENT_OTHER): Payer: Self-pay | Admitting: Plastic Surgery

## 2018-02-03 ENCOUNTER — Other Ambulatory Visit: Payer: Self-pay | Admitting: Cardiovascular Disease

## 2018-02-03 NOTE — Telephone Encounter (Signed)
New message         *STAT* If patient is at the pharmacy, call can be transferred to refill team.   1. Which medications need to be refilled? (please list name of each medication and dose if known)   amLODipine (NORVASC) 5 MG tablet Take 5 mg by mouth daily. Twice a day      2. Which pharmacy/location (including street and city if local pharmacy) is medication to be sent to?[id number] - [place], [place]  3. Do they need a 30 day or 90 day supply? [place]

## 2018-02-04 NOTE — Telephone Encounter (Signed)
Left message for pt to call, ? Amlodipine 5 mg twice daily, only once daily in chart.

## 2018-02-07 ENCOUNTER — Observation Stay (HOSPITAL_COMMUNITY)
Admission: EM | Admit: 2018-02-07 | Discharge: 2018-02-08 | Disposition: A | Discharge status: Home / Self Care | Payer: Medicare HMO | Attending: Internal Medicine | Admitting: Internal Medicine

## 2018-02-07 ENCOUNTER — Other Ambulatory Visit: Payer: Self-pay

## 2018-02-07 ENCOUNTER — Encounter (HOSPITAL_COMMUNITY): Payer: Self-pay | Admitting: Emergency Medicine

## 2018-02-07 ENCOUNTER — Emergency Department (HOSPITAL_COMMUNITY): Payer: Medicare HMO

## 2018-02-07 DIAGNOSIS — Z888 Allergy status to other drugs, medicaments and biological substances status: Secondary | ICD-10-CM | POA: Insufficient documentation

## 2018-02-07 DIAGNOSIS — I10 Essential (primary) hypertension: Secondary | ICD-10-CM | POA: Diagnosis not present

## 2018-02-07 DIAGNOSIS — I130 Hypertensive heart and chronic kidney disease with heart failure and stage 1 through stage 4 chronic kidney disease, or unspecified chronic kidney disease: Secondary | ICD-10-CM | POA: Insufficient documentation

## 2018-02-07 DIAGNOSIS — R69 Illness, unspecified: Secondary | ICD-10-CM | POA: Diagnosis not present

## 2018-02-07 DIAGNOSIS — Z955 Presence of coronary angioplasty implant and graft: Secondary | ICD-10-CM | POA: Diagnosis not present

## 2018-02-07 DIAGNOSIS — F419 Anxiety disorder, unspecified: Secondary | ICD-10-CM | POA: Insufficient documentation

## 2018-02-07 DIAGNOSIS — I739 Peripheral vascular disease, unspecified: Secondary | ICD-10-CM | POA: Insufficient documentation

## 2018-02-07 DIAGNOSIS — R195 Other fecal abnormalities: Secondary | ICD-10-CM | POA: Diagnosis not present

## 2018-02-07 DIAGNOSIS — Z79890 Hormone replacement therapy (postmenopausal): Secondary | ICD-10-CM | POA: Insufficient documentation

## 2018-02-07 DIAGNOSIS — Z90710 Acquired absence of both cervix and uterus: Secondary | ICD-10-CM | POA: Insufficient documentation

## 2018-02-07 DIAGNOSIS — Z8673 Personal history of transient ischemic attack (TIA), and cerebral infarction without residual deficits: Secondary | ICD-10-CM | POA: Diagnosis not present

## 2018-02-07 DIAGNOSIS — K5730 Diverticulosis of large intestine without perforation or abscess without bleeding: Secondary | ICD-10-CM | POA: Insufficient documentation

## 2018-02-07 DIAGNOSIS — Z8249 Family history of ischemic heart disease and other diseases of the circulatory system: Secondary | ICD-10-CM | POA: Diagnosis not present

## 2018-02-07 DIAGNOSIS — E785 Hyperlipidemia, unspecified: Secondary | ICD-10-CM | POA: Insufficient documentation

## 2018-02-07 DIAGNOSIS — I5032 Chronic diastolic (congestive) heart failure: Secondary | ICD-10-CM | POA: Insufficient documentation

## 2018-02-07 DIAGNOSIS — I480 Paroxysmal atrial fibrillation: Secondary | ICD-10-CM | POA: Diagnosis not present

## 2018-02-07 DIAGNOSIS — K56699 Other intestinal obstruction unspecified as to partial versus complete obstruction: Secondary | ICD-10-CM | POA: Diagnosis not present

## 2018-02-07 DIAGNOSIS — I252 Old myocardial infarction: Secondary | ICD-10-CM | POA: Diagnosis not present

## 2018-02-07 DIAGNOSIS — Z79899 Other long term (current) drug therapy: Secondary | ICD-10-CM | POA: Insufficient documentation

## 2018-02-07 DIAGNOSIS — R52 Pain, unspecified: Secondary | ICD-10-CM | POA: Diagnosis not present

## 2018-02-07 DIAGNOSIS — E039 Hypothyroidism, unspecified: Secondary | ICD-10-CM | POA: Diagnosis present

## 2018-02-07 DIAGNOSIS — Z9049 Acquired absence of other specified parts of digestive tract: Secondary | ICD-10-CM | POA: Insufficient documentation

## 2018-02-07 DIAGNOSIS — R1084 Generalized abdominal pain: Secondary | ICD-10-CM | POA: Diagnosis not present

## 2018-02-07 DIAGNOSIS — K5939 Other megacolon: Secondary | ICD-10-CM | POA: Insufficient documentation

## 2018-02-07 DIAGNOSIS — K56601 Complete intestinal obstruction, unspecified as to cause: Secondary | ICD-10-CM

## 2018-02-07 DIAGNOSIS — I2510 Atherosclerotic heart disease of native coronary artery without angina pectoris: Secondary | ICD-10-CM | POA: Insufficient documentation

## 2018-02-07 DIAGNOSIS — N182 Chronic kidney disease, stage 2 (mild): Secondary | ICD-10-CM | POA: Diagnosis not present

## 2018-02-07 DIAGNOSIS — R109 Unspecified abdominal pain: Secondary | ICD-10-CM | POA: Diagnosis present

## 2018-02-07 DIAGNOSIS — Z87891 Personal history of nicotine dependence: Secondary | ICD-10-CM | POA: Diagnosis not present

## 2018-02-07 DIAGNOSIS — Z950 Presence of cardiac pacemaker: Secondary | ICD-10-CM | POA: Diagnosis not present

## 2018-02-07 DIAGNOSIS — F329 Major depressive disorder, single episode, unspecified: Secondary | ICD-10-CM | POA: Insufficient documentation

## 2018-02-07 DIAGNOSIS — Z951 Presence of aortocoronary bypass graft: Secondary | ICD-10-CM | POA: Diagnosis not present

## 2018-02-07 DIAGNOSIS — Z823 Family history of stroke: Secondary | ICD-10-CM | POA: Insufficient documentation

## 2018-02-07 DIAGNOSIS — Z66 Do not resuscitate: Secondary | ICD-10-CM | POA: Diagnosis not present

## 2018-02-07 DIAGNOSIS — K56609 Unspecified intestinal obstruction, unspecified as to partial versus complete obstruction: Secondary | ICD-10-CM | POA: Diagnosis not present

## 2018-02-07 DIAGNOSIS — R110 Nausea: Secondary | ICD-10-CM | POA: Diagnosis not present

## 2018-02-07 LAB — COMPREHENSIVE METABOLIC PANEL
ALT: 17 U/L (ref 0–44)
AST: 24 U/L (ref 15–41)
Albumin: 4.5 g/dL (ref 3.5–5.0)
Alkaline Phosphatase: 84 U/L (ref 38–126)
Anion gap: 12 (ref 5–15)
BUN: 17 mg/dL (ref 8–23)
CO2: 22 mmol/L (ref 22–32)
Calcium: 9.5 mg/dL (ref 8.9–10.3)
Chloride: 103 mmol/L (ref 98–111)
Creatinine, Ser: 0.95 mg/dL (ref 0.44–1.00)
GFR calc Af Amer: >60 mL/min (ref >60)
GFR calc non Af Amer: 58 mL/min — ABNORMAL LOW (ref >60)
Glucose, Bld: 160 mg/dL — ABNORMAL HIGH (ref 70–99)
Potassium: 3.6 mmol/L (ref 3.5–5.1)
Sodium: 137 mmol/L (ref 135–145)
Total Bilirubin: 1 mg/dL (ref 0.3–1.2)
Total Protein: 7.6 g/dL (ref 6.5–8.1)

## 2018-02-07 LAB — CBC WITH DIFFERENTIAL/PLATELET
Abs Immature Granulocytes: 0.03 10*3/uL (ref 0.00–0.07)
Basophils Absolute: 0.1 10*3/uL (ref 0.0–0.1)
Basophils Relative: 0 %
Eosinophils Absolute: 0.1 10*3/uL (ref 0.0–0.5)
Eosinophils Relative: 1 %
HCT: 49.7 % — ABNORMAL HIGH (ref 36.0–46.0)
Hemoglobin: 16.7 g/dL — ABNORMAL HIGH (ref 12.0–15.0)
Immature Granulocytes: 0 %
Lymphocytes Relative: 6 %
Lymphs Abs: 0.8 10*3/uL (ref 0.7–4.0)
MCH: 30.4 pg (ref 26.0–34.0)
MCHC: 33.6 g/dL (ref 30.0–36.0)
MCV: 90.4 fL (ref 80.0–100.0)
Monocytes Absolute: 0.7 10*3/uL (ref 0.1–1.0)
Monocytes Relative: 5 %
Neutro Abs: 11.3 10*3/uL — ABNORMAL HIGH (ref 1.7–7.7)
Neutrophils Relative %: 88 %
Platelets: 303 10*3/uL (ref 150–400)
RBC: 5.5 MIL/uL — ABNORMAL HIGH (ref 3.87–5.11)
RDW: 12.3 % (ref 11.5–15.5)
WBC: 13 10*3/uL — ABNORMAL HIGH (ref 4.0–10.5)
nRBC: 0 % (ref 0.0–0.2)

## 2018-02-07 LAB — URINALYSIS, ROUTINE W REFLEX MICROSCOPIC
Bilirubin Urine: NEGATIVE
Glucose, UA: NEGATIVE mg/dL
Hgb urine dipstick: NEGATIVE
Ketones, ur: 20 mg/dL — AB
Leukocytes, UA: NEGATIVE
Nitrite: NEGATIVE
Protein, ur: 30 mg/dL — AB
Specific Gravity, Urine: 1.04 — ABNORMAL HIGH (ref 1.005–1.030)
pH: 5 (ref 5.0–8.0)

## 2018-02-07 LAB — I-STAT CG4 LACTIC ACID, ED
Lactic Acid, Venous: 2.02 mmol/L (ref 0.5–1.9)
Lactic Acid, Venous: 2.13 mmol/L (ref 0.5–1.9)

## 2018-02-07 LAB — MAGNESIUM: Magnesium: 2.4 mg/dL (ref 1.7–2.4)

## 2018-02-07 LAB — LIPASE, BLOOD: Lipase: 31 U/L (ref 11–51)

## 2018-02-07 MED ORDER — METOCLOPRAMIDE HCL 5 MG/ML IJ SOLN
10.0000 mg | Freq: Once | INTRAMUSCULAR | Status: AC
  Administered 2018-02-07: 10 mg via INTRAVENOUS
  Filled 2018-02-07: qty 2

## 2018-02-07 MED ORDER — SODIUM CHLORIDE (PF) 0.9 % IJ SOLN
INTRAMUSCULAR | Status: AC
  Filled 2018-02-07: qty 50

## 2018-02-07 MED ORDER — IOPAMIDOL (ISOVUE-300) INJECTION 61%
INTRAVENOUS | Status: AC
  Filled 2018-02-07: qty 100

## 2018-02-07 MED ORDER — LACTATED RINGERS IV BOLUS
1000.0000 mL | Freq: Once | INTRAVENOUS | Status: AC
  Administered 2018-02-07: 1000 mL via INTRAVENOUS

## 2018-02-07 MED ORDER — LACTATED RINGERS IV SOLN
INTRAVENOUS | Status: DC
  Administered 2018-02-07 – 2018-02-08 (×3): via INTRAVENOUS

## 2018-02-07 MED ORDER — FENTANYL CITRATE (PF) 100 MCG/2ML IJ SOLN
50.0000 ug | Freq: Once | INTRAMUSCULAR | Status: AC
  Administered 2018-02-07: 50 ug via INTRAVENOUS
  Filled 2018-02-07: qty 2

## 2018-02-07 MED ORDER — HYDROMORPHONE HCL 1 MG/ML IJ SOLN: 0.5000 mg | INTRAMUSCULAR | Status: DC | PRN

## 2018-02-07 MED ORDER — ONDANSETRON HCL 4 MG/2ML IJ SOLN
4.0000 mg | Freq: Once | INTRAMUSCULAR | Status: DC
  Filled 2018-02-07: qty 2

## 2018-02-07 MED ORDER — IOPAMIDOL (ISOVUE-300) INJECTION 61%
100.0000 mL | Freq: Once | INTRAVENOUS | Status: AC | PRN
  Administered 2018-02-07: 100 mL via INTRAVENOUS

## 2018-02-07 MED ORDER — SORBITOL 70 % SOLN
960.0000 mL | TOPICAL_OIL | Freq: Once | ORAL | Status: AC
  Administered 2018-02-07: 960 mL via RECTAL
  Filled 2018-02-07: qty 473

## 2018-02-07 MED ORDER — HYDROMORPHONE HCL 1 MG/ML IJ SOLN
0.5000 mg | Freq: Once | INTRAMUSCULAR | Status: AC
  Administered 2018-02-07: 0.5 mg via INTRAVENOUS
  Filled 2018-02-07: qty 1

## 2018-02-07 MED ORDER — ONDANSETRON HCL 4 MG/2ML IJ SOLN: 4.0000 mg | INTRAMUSCULAR | Status: DC | PRN

## 2018-02-07 MED ORDER — HYDROMORPHONE HCL 1 MG/ML IJ SOLN
1.0000 mg | Freq: Once | INTRAMUSCULAR | Status: AC
  Administered 2018-02-07: 1 mg via INTRAVENOUS
  Filled 2018-02-07: qty 1

## 2018-02-07 MED ORDER — ONDANSETRON HCL 4 MG/2ML IJ SOLN
4.0000 mg | Freq: Once | INTRAMUSCULAR | Status: AC | PRN
  Administered 2018-02-07: 4 mg via INTRAVENOUS
  Filled 2018-02-07: qty 2

## 2018-02-07 NOTE — ED Notes (Signed)
Report given to 5w.

## 2018-02-07 NOTE — H&P (Signed)
History and Physical    PLEASE NOTE THAT DRAGON DICTATION SOFTWARE WAS USED IN THE CONSTRUCTION OF THIS NOTE.   [name] VZC:[id number] DOB: Jun 06, 1940 DOA: 02/07/2018  PCP: Patient, No Pcp Per Patient coming from: home  I have personally briefly reviewed patient's old medical records in [place]  Chief Complaint: Abdominal pain  HPI: [name] is a 77 y.o. female with medical history significant for chronic diastolic heart failure, diverticulitis status post partial colectomy with reanastomosis in 2010, hypertension, who is admitted to [facility] long hospital on 02/07/2018 with mechanical large bowel obstruction after presenting from home to [facility] emergency department complaining of abdominal pain.  The patient reports 1 day of progressive diffuse abdominal discomfort associated with nausea in the absence of any vomiting as well as diminished flatus production over that time.  Relative to baseline of 1 bowel movement per day, the patient reports that it is been approximately 72 hours since her most recent bowel movement.  Denies any recent trauma denies any associated subjective fever, chills, or rigors.  Recognizing a decline in her biopsy relative to baseline, the patient reports that she tried Colace and MiraLAX as an outpatient, but without any resultant bowel movement.  In terms of prior abdominal procedures, the patient reports a history of diverticulitis status post partial colectomy with reanastomosis in 2010.  She also reports complete hysterectomy as an open procedure.  Denies any prior history of small or large bowel obstruction.   ED Course: Vital signs in the ED were notable for the following: Patient noted to be afebrile, heart rate 7481; blood pressure 153/71, respiratory rate 16-20, and oxygen saturation 93 to 100% on room air.  Labs performed in the ED were notable for the following: CMP notable for creatinine 0.95, liver enzymes found to be within  normal limits; CBC notable for white blood cell count of 13,000.  Urinalysis showed 0 white blood cells as well as nitrate and leukocyte Estrace negative.  CT of the abdomen/pelvis with contrast, per final radiology report showed dilated colon with fluid levels above a desiccated stool ball in lower rectum which appears obstructive.  Multiple attempts at manual disimpaction of aforementioned stool ball were unsuccessful as stool was noted to be located too proximal for such an invention.   While in the ED, the following were administered: Fentanyl 50 mcg IV x1, Dilaudid 0.5 mg IV x1, Dilaudid 1.0 mg IV x1, Reglan 10 mg IV x1, and a 1 L lactated ringer bolus.  Subsequently, the patient was admitted for observation for mechanical large bowel obstruction in the setting of obstructing stool ball.   Review of Systems: As per HPI otherwise 10 point review of systems negative.   Past Medical History:  Diagnosis Date  . ANXIETY 12/31/2009  . Aortic insufficiency    a. mod by echo 2017.  Mar[place] Kitchen CAD (coronary artery disease)    a. s/p CABGx3 and LAA clipping in 12/2014, NSTEMI 05/2015 s/p DES to native RCA and SVG-dRCA; occluded ramus-SVG was treated medically). c. neg nuc 10/2015 at [facility].  . Chronic diastolic CHF (congestive heart failure) ([place])   . Chronic fatigue   . CKD (chronic kidney disease), stage II   . DEPRESSION 12/31/2009  . [place] DISEASE, CERVICAL 12/31/2009  . [place] DISEASE, LUMBAR 12/31/2009  . DIVERTICULITIS, HX OF 12/31/2009  . Essential hypertension   . Gait abnormality 03/25/2017  . GLUCOSE INTOLERANCE 12/31/2009  . Habitual alcohol use   . History of stroke  self reports " they say i have had some pin strokes"   . Hypercalcemia   . Hyperkalemia   . Hyperlipidemia 12/31/2009  . Hypothyroidism   . MENOPAUSE, EARLY 12/31/2009  . NSTEMI (non-ST elevated myocardial infarction) ([place]) 05/11/2015  . Orthostatic hypotension   . OSTEOARTHRITIS, HAND   . Pacemaker 01/21/2012   MDT Adapta dual  chamber  . Paroxysmal atrial flutter ([place])   . Persistent atrial fibrillation   . PVD (peripheral vascular disease), hx stents to bil SFAs 02/2010   . Rash, skin     superficial raised red pencil point sized rash bilateral forearms; states " it started when i started the Plavix "   . Ruptured left breast implant   . Symptomatic sinus bradycardia 01/22/2012  . Syncope    a. 11/2015 ? due to medications.  . Syncope    reports on 05-10-17 " i passed out 2 months ago in the bathroom and broke some ribs"    . Tachy-brady syndrome ([place])    a. s/p MDT PPM 2013.  . Tricuspid regurgitation     Past Surgical History:  Procedure Laterality Date  . ABDOMINAL AORTOGRAM W/LOWER EXTREMITY Bilateral 02/15/2017   Procedure: ABDOMINAL AORTOGRAM W/LOWER EXTREMITY;  Surgeon: Lorretta Harp, MD;  Location: [place] CV LAB;  Service: Cardiovascular;  Laterality: Bilateral;  bilat  . Ablation of typical atrial flutter (CTI line)  09/05/2016   Dr Antonieta Pert at [facility]   . AUGMENTATION MAMMAPLASTY  1983  . BREAST IMPLANT REMOVAL Bilateral 01/14/2018   Procedure: REMOVAL BILATERAL BREAST IMPLANTS;  Surgeon: Irene Limbo, MD;  Location: [place];  Service: Plastics;  Laterality: Bilateral;  . CAPSULECTOMY Bilateral 01/14/2018   Procedure: CAPSULECTOMY;  Surgeon: Irene Limbo, MD;  Location: [place];  Service: Plastics;  Laterality: Bilateral;  . CARDIAC CATHETERIZATION N/A 01/01/2015   Procedure: Left Heart Cath and Coronary Angiography;  Surgeon: Jettie Booze, MD;  Location: [place] CV LAB;  Service: Cardiovascular;  Laterality: N/A;  . CARDIAC CATHETERIZATION N/A 05/12/2015   Procedure: Left Heart Cath and Coronary Angiography;  Surgeon: Troy Sine, MD;  Location: [place] CV LAB;  Service: Cardiovascular;  Laterality: N/A;  . CARDIAC CATHETERIZATION N/A 05/12/2015   Procedure: Coronary Stent Intervention;  Surgeon: Troy Sine, MD;  Location: [place] CV LAB;  Service: Cardiovascular;  Laterality: N/A;  . CARDIOVERSION N/A 02/01/2015   Procedure: CARDIOVERSION;  Surgeon: Lelon Perla, MD;  Location: [facility] ENDOSCOPY;  Service: Cardiovascular;  Laterality: N/A;  . CARDIOVERSION N/A 08/30/2015   Procedure: CARDIOVERSION;  Surgeon: Sanda Klein, MD;  Location: [place] ENDOSCOPY;  Service: Cardiovascular;  Laterality: N/A;  . CARPAL TUNNEL RELEASE  2008   "right hand/thumb; carpal tunnel repair; got rid of arthritis" (01/21/2012)  . CLIPPING OF ATRIAL APPENDAGE N/A 01/04/2015   Procedure: CLIPPING OF ATRIAL APPENDAGE;  Surgeon: Grace Isaac, MD;  Location: [place];  Service: Open Heart Surgery;  Laterality: N/A;  . COLONOSCOPY N/A 12/21/2015   Procedure: COLONOSCOPY;  Surgeon: Milus Banister, MD;  Location: [facility] ENDOSCOPY;  Service: Endoscopy;  Laterality: N/A;  . CORONARY ARTERY BYPASS GRAFT N/A 01/04/2015   Procedure: CORONARY ARTERY BYPASS GRAFTING (CABG) x 3 using left internal mammory artery and greater saphenous vein right leg harvested endoscopically.;  Surgeon: Grace Isaac, MD;  LIMA-LAD, SVG-RI, SVG-PDA  . ESOPHAGOGASTRODUODENOSCOPY (EGD) WITH PROPOFOL N/A 12/18/2015   Procedure: ESOPHAGOGASTRODUODENOSCOPY (EGD) WITH PROPOFOL;  Surgeon: Milus Banister, MD;  Location: [facility];  Service: Endoscopy;  Laterality: N/A;  . FACELIFT, LOWER 2/3  1995   "mini" (01/21/2012)  . GIVENS CAPSULE STUDY N/A 12/21/2015   Procedure: GIVENS CAPSULE STUDY;  Surgeon: Milus Banister, MD;  Location: [facility];  Service: Endoscopy;  Laterality: N/A;  . Lower Arterial Examination  10/28/2011   R. SFA stent mild-moderate mixed density plaque with elevated velocities consistent with 50% diameter reduction. L. SFA stent moderate mixed denisty plaque at mid to distal level consistent with 50-69% diameter reduction.  . LOWER EXTREMITY ANGIOGRAPHY N/A 02/15/2017   Procedure: LOWER EXTREMITY ANGIOGRAPHY;  Surgeon: Lorretta Harp, MD;  Location:  [place] CV LAB;  Service: Cardiovascular;  Laterality: N/A;  . OOPHORECTOMY  ~1979  . PARATHYROIDECTOMY N/A 05/13/2017   Procedure: PARATHYROIDECTOMY;  Surgeon: Armandina Gemma, MD;  Location: [facility];  Service: General;  Laterality: N/A;  . PARTIAL COLECTOMY  2010  . PERIPHERAL ARTERIAL STENT GRAFT  2012; 2012   "LLE; RLE" (01/21/2012)  . PERIPHERAL VASCULAR BALLOON ANGIOPLASTY Right 02/15/2017   Procedure: PERIPHERAL VASCULAR BALLOON ANGIOPLASTY;  Surgeon: Lorretta Harp, MD;  Location: [place] CV LAB;  Service: Cardiovascular;  Laterality: Right;  SFA    . PERMANENT PACEMAKER INSERTION N/A 01/21/2012   Medtronic Adapta L implanted by Dr Sallyanne Kuster for tachy/brady syndrome  . POSTERIOR CERVICAL LAMINECTOMY  1985  . TEE WITHOUT CARDIOVERSION N/A 01/04/2015   Procedure: TRANSESOPHAGEAL ECHOCARDIOGRAM (TEE);  Surgeon: Grace Isaac, MD;  Location: [place];  Service: Open Heart Surgery;  Laterality: N/A;  . TEE WITHOUT CARDIOVERSION N/A 02/01/2015   Procedure: TRANSESOPHAGEAL ECHOCARDIOGRAM (TEE);  Surgeon: Lelon Perla, MD;  Location: [facility] ENDOSCOPY;  Service: Cardiovascular;  Laterality: N/A;  . TEE WITHOUT CARDIOVERSION N/A 08/30/2015   Procedure: TRANSESOPHAGEAL ECHOCARDIOGRAM (TEE);  Surgeon: Sanda Klein, MD;  Location: [place];  Service: Cardiovascular;  Laterality: N/A;  . VAGINAL HYSTERECTOMY  1975    Social History:  reports that she quit smoking about 12 years ago. Her smoking use included cigarettes. She has a 7.50 pack-year smoking history. She has never used smokeless tobacco. She reports current alcohol use. She reports that she does not use drugs.   Allergies  Allergen Reactions  . Brilinta [Ticagrelor] Shortness Of Breath  . Clonidine Derivatives Other (See Comments)    Lowers heart rate  . Atorvastatin Other (See Comments)    Possible cause of fatigue/malaise  . Crestor [Rosuvastatin Calcium] Other (See Comments)    Joint/muscle aches  . Exforge  [Amlodipine Besylate-Valsartan] Itching and Rash  . Spironolactone Other (See Comments)    Contraindicated with history of hyperkalemia    Family History  Problem Relation Age of Onset  . Heart disease Mother   . Hypertension Mother   . Stroke Mother   . Stroke Father   . Heart disease Father   . Heart disease Brother   . Hypertension Brother        2 brothers  . CVA Brother        2 brothers  . Heart attack Brother        2 brothers     Prior to Admission medications   Medication Sig Start Date End Date Taking? Authorizing Provider  amLODipine (NORVASC) 5 MG tablet Take 5 mg by mouth daily. 09/14/17  Yes [provider]  Ascorbic Acid (VITAMIN C PO) Take 1 tablet by mouth daily.   Yes [provider]  cholecalciferol (VITAMIN D) 1000 UNITS tablet Take 1,000 Units by mouth daily.  Yes [provider]  citalopram (CELEXA) 20 MG tablet Take 0.5 tablets (10 mg total) by mouth daily. Future refills from PCP 10/19/17  Yes Croitoru, Mihai, MD  Coenzyme Q10 (CO Q-10) 100 MG CAPS Take 100 mg by mouth at bedtime.    Yes [provider]  dofetilide (TIKOSYN) 250 MCG capsule Take 250 mcg by mouth 2 (two) times daily.   Yes [provider]  furosemide (LASIX) 20 MG tablet Take 20 mg by mouth daily as needed for fluid or edema.    Yes [provider]  levothyroxine (SYNTHROID, LEVOTHROID) 25 MCG tablet Take 25-50 mcg by mouth daily before breakfast. Take 1 tablet (25 mcg) by mouth Monday through Thursday, then take 2 tablets (50 mcg) on Friday, Saturday, & Sunday. 04/03/16  Yes [provider]  MAGNESIUM CITRATE PO Take 400 mg by mouth daily.   Yes [provider]  Multiple Vitamin (MULTIVITAMIN WITH MINERALS) TABS tablet Take 2 tablets by mouth daily.    Yes [provider]  nitroGLYCERIN (NITROSTAT) 0.4 MG SL tablet Place 1 tablet (0.4 mg total) under the tongue every 5 (five) minutes x 3 doses as needed for chest  pain. 05/15/15  Yes Simmons, Brittainy M, PA-C  Omega-3 Fatty Acids (SUPER OMEGA-3) 1000 MG CAPS Take 1,000 mg by mouth at bedtime.    Yes [provider]  alendronate (FOSAMAX) 70 MG tablet Take 70 mg by mouth every Thursday.  03/24/16   [provider]     Objective     Physical Exam: Vitals:   02/07/18 0800 02/07/18 0900 02/07/18 1122 02/07/18 1123  BP: (!) 164/74 119/82 (!) 162/72   Pulse: 77 78 81 81  Resp: 15 19 18 18   Temp:      TempSrc:      SpO2: 98% (!) 89% 97% 97%  Weight:      Height:        General: appears to be stated age; alert, oriented Skin: warm, dry, no rash Head:  AT/[place] Mouth:  Oral mucosa membranes appear dry, normal dentition Neck: supple; trachea midline Heart:  RRR; did not appreciate any M/R/G Lungs: CTAB, did not appreciate any wheezes, rales, or rhonchi Abdomen: Bowel sounds diminished throughout; mildly distended; mild tenderness throughout all abdominal quadrants in the absence of any guarding, rigidity, or rebound tenderness. Extremities: no peripheral edema, no muscle wasting   Labs on Admission: I have personally reviewed following labs and imaging studies  CBC: Recent Labs  Lab 02/07/18 0627  WBC 13.0*  NEUTROABS 11.3*  HGB 16.7*  HCT 49.7*  MCV 90.4  PLT 654   Basic Metabolic Panel: Recent Labs  Lab 02/07/18 0627  NA 137  K 3.6  CL 103  CO2 22  GLUCOSE 160*  BUN 17  CREATININE 0.95  CALCIUM 9.5   GFR: Estimated Creatinine Clearance: 48.2 mL/min (by C-G formula based on SCr of 0.95 mg/dL). Liver Function Tests: Recent Labs  Lab 02/07/18 0627  AST 24  ALT 17  ALKPHOS 84  BILITOT 1.0  PROT 7.6  ALBUMIN 4.5   Recent Labs  Lab 02/07/18 0627  LIPASE 31   No results for input(s): AMMONIA in the last 168 hours. Coagulation Profile: No results for input(s): INR, PROTIME in the last 168 hours. Cardiac Enzymes: No results for input(s): CKTOTAL, CKMB, CKMBINDEX, TROPONINI in the last 168  hours. BNP (last 3 results) No results for input(s): PROBNP in the last 8760 hours. HbA1C: No results for input(s): HGBA1C in  the last 72 hours. CBG: No results for input(s): GLUCAP in the last 168 hours. Lipid Profile: No results for input(s): CHOL, HDL, LDLCALC, TRIG, CHOLHDL, LDLDIRECT in the last 72 hours. Thyroid Function Tests: No results for input(s): TSH, T4TOTAL, FREET4, T3FREE, THYROIDAB in the last 72 hours. Anemia Panel: No results for input(s): VITAMINB12, FOLATE, FERRITIN, TIBC, IRON, RETICCTPCT in the last 72 hours. Urine analysis:    Component Value Date/Time   COLORURINE AMBER (A) 02/07/2018 0946   APPEARANCEUR CLEAR 02/07/2018 0946   LABSPEC 1.040 (H) 02/07/2018 0946   PHURINE 5.0 02/07/2018 0946   GLUCOSEU NEGATIVE 02/07/2018 0946   GLUCOSEU NEGATIVE 12/31/2009 1612   HGBUR NEGATIVE 02/07/2018 0946   BILIRUBINUR NEGATIVE 02/07/2018 0946   BILIRUBINUR small (A) 02/04/2017 1454   KETONESUR 20 (A) 02/07/2018 0946   PROTEINUR 30 (A) 02/07/2018 0946   UROBILINOGEN 0.2 02/04/2017 1454   UROBILINOGEN 1.0 06/03/2012 1610   NITRITE NEGATIVE 02/07/2018 0946   LEUKOCYTESUR NEGATIVE 02/07/2018 0946    Radiological Exams on Admission: Ct Abdomen Pelvis W Contrast  Result Date: 02/07/2018 CLINICAL DATA:  Abdominal distention. Breast implants removed 2 weeks ago. No bowel movement for the past few days. EXAM: CT ABDOMEN AND PELVIS WITH CONTRAST TECHNIQUE: Multidetector CT imaging of the abdomen and pelvis was performed using the standard protocol following bolus administration of intravenous contrast. CONTRAST:  194mL ISOVUE-300 IOPAMIDOL (ISOVUE-300) INJECTION 61% COMPARISON:  05/11/2015 FINDINGS: Lower chest: Limited coverage of the lower chest without acute finding. Pacer leads seen into the right ventricle. Hepatobiliary: Incomplete coverage of the high right liver. No focal hepatic abnormality.No evidence of biliary obstruction or stone. Pancreas: Unremarkable. Spleen:  Unremarkable. Adrenals/Urinary Tract: 13 mm right adrenal nodule that is stable from 01/01/2010 and most consistent with benign adenoma. No hydronephrosis or stone. Unremarkable bladder. Stomach/Bowel: Desiccated/lamellated stool at the upper rectum with distal decompression and more proximal dilatation by gas and fluid levels. The postoperative sigmoid colon is the most dilated. No adjacent transition point is seen. The small bowel is not dilated. There is mild mesenteric stranding at the level of the rectal stool ball as described, likely from pressure. Sigmoid diverticulosis. Vascular/Lymphatic: Severe atherosclerosis with irregular plaque seen throughout the distal aorta and bilateral iliacs. High-grade narrowing at the proximal left superficial femoral artery. There is history of lower extremity angiogram earlier this year. No mass or adenopathy. Reproductive:Hysterectomy Other: No ascites or pneumoperitoneum. Musculoskeletal: No acute abnormalities. Disc degeneration with L3-4 retrolisthesis and kyphotic deformity the level of the upper lumbar spine. IMPRESSION: Dilated colon with fluid levels above a desiccated stool ball in the lower rectum which is presumably obstructive. Electronically Signed   By: Monte Fantasia M.D.   On: 02/07/2018 09:42     Assessment/Plan   [name] is a 77 y.o. female with medical history significant for chronic diastolic heart failure, diverticulitis status post partial colectomy with reanastomosis in 2010, hypertension, who is admitted to [facility] long hospital on 02/07/2018 with mechanical large bowel obstruction after presenting from home to [facility] emergency department complaining of abdominal pain.   Principal Problem:   Large bowel obstruction (HCC) Active Problems:   Essential hypertension   Abdominal pain   PAF (paroxysmal atrial fibrillation) (HCC)   Hypothyroidism (acquired)    #) Mechanical large bowel obstruction: Appears secondary to  obstructing desiccated stool ball in the lower rectum, as further described above, in the absence of any evidence of bowel obstruction or any evidence of bowel perforation.  Stool ball appears to  be positioned to proximal for effective manual disimpaction.   Plan: N.p.o.  Soapsuds enema.  Gentle IV fluid rehydration to promote bowel motility.  PRN IV Dilaudid.  PRN IV Zofran.    #) Paroxysmal atrial fibrillation: Complicated by tachybradycardia syndrome status post pacemaker placement in 2013.  It appears that a rhythm control approach is pursued on an outpatient basis, with the patient on Tikosyn at home. While CHA2DS2-VASc score would merit anticoagulation for thromboembolic prophylactic purposes, it does not appear that the patient is anticoagulated at home.  Plan: We will hold Tikosyn for now in the setting of current n.p.o. status per the context of presenting mechanical large bowel obstruction, as above.  Will want to clarify with patient her lack of anticoagulation.     #) Chronic diastolic heart failure: Most recent echocardiogram occurred in August 2018, and showed normal wall thickness, LVEF 55 to 60%, no focal wall motion realities, and grade 2 diastolic dysfunction.  She takes Lasix on a as needed basis at home.  No evidence at present to suggest acutely decompensated heart failure:  Plan: We will closely monitor strict I's and O's and monitor closely for development of volume overload given that the patient will be receiving some IV fluids in the setting of presenting mechanical large bowel obstruction.  Add serum magnesium level to labs already collected in the ED.    #) Essential hypertension: On Norvasc as an outpatient.  Plan: We will hold home Norvasc for now in the setting of current n.p.o. status.    #) Depression: On Celexa at home.  Plan: We will hold home Celexa for now in the setting of n.p.o. status, as above.    DVT prophylaxis: scd's Code Status: DNR (per my  discussions with the patient today). Family Communication: case discussed with the patient's fianc, who is present at bedside. Disposition Plan:  Per Rounding Team Consults called: (none)  Admission status: observation; med-surg.   PLEASE NOTE THAT DRAGON DICTATION SOFTWARE WAS USED IN THE CONSTRUCTION OF THIS NOTE.   [facility] Pager [phone] From 7 AM - 7 PM.  Otherwise, please contact night-coverage  [email or web address] Password [email or web address]  02/07/2018, 11:45 AM

## 2018-02-07 NOTE — ED Triage Notes (Signed)
Pt brought in by EMS for c/o abd pain  Pt has not had a BM in the past 3 days  Pt has hx of diverticulitis  Pt vomited once prior to EMS arrival  EMS gave Zofran  Nausea relieved with medication  Pt had surgery a week ago at [facility] for a breast implant rupture  Pt has not been taking any narcotics that would cause her to be constipated  Abdomen distended  Pt has hx of bowel resection

## 2018-02-07 NOTE — ED Notes (Signed)
ED TO INPATIENT HANDOFF REPORT  Name/Age/Gender [name] 77 y.o. female  Code Status    Code Status Orders  (From admission, onward)         Start     Ordered   02/07/18 1325  Do not attempt resuscitation (DNR)  Continuous    Question Answer Comment  In the event of cardiac or respiratory ARREST Do not call a "code blue"   In the event of cardiac or respiratory ARREST Do not perform Intubation, CPR, defibrillation or ACLS   In the event of cardiac or respiratory ARREST Use medication by any route, position, wound care, and other measures to relive pain and suffering. May use oxygen, suction and manual treatment of airway obstruction as needed for comfort.      02/07/18 1326        Code Status History    Date Active Date Inactive Code Status Order ID Comments User Context   02/15/2017 1104 02/16/2017 1331 Full Code [id number]  Lorretta Harp, MD Inpatient   12/17/2015 0626 12/23/2015 1913 Partial Code [id number]  Rise Patience, MD Inpatient   12/17/2015 0625 12/17/2015 0626 Full Code [id number]  Rise Patience, MD Inpatient   12/05/2015 1948 12/09/2015 1713 Partial Code [id number]  Vianne Bulls, MD ED   12/03/2015 0116 12/04/2015 1900 Partial Code [id number]  Toy Baker, MD Inpatient   08/26/2015 0558 08/27/2015 2129 Full Code [id number]  Rise Patience, MD ED   06/28/2015 0004 06/29/2015 1812 Full Code [id number]  Roney Jaffe, MD Inpatient   05/12/2015 0236 05/15/2015 1506 Full Code [id number]  Sueanne Margarita, MD Inpatient   01/31/2015 1731 02/01/2015 2134 Full Code [id number]  Isaiah Serge, NP Inpatient   01/04/2015 1256 01/14/2015 1844 Full Code [id number]  Nani Skillern, PA-C Inpatient   01/01/2015 1049 01/04/2015 1256 Full Code [id number]  Jettie Booze, MD Inpatient    Advance Directive Documentation     Most Recent Value  Type of Advance Directive  Healthcare Power of Attorney, Living will  Pre-existing out of facility DNR order  (yellow form or pink MOST form)  -  "MOST" Form in Place?  -      Home/SNF/Other Home  Chief Complaint Abd Pain  Level of Care/Admitting Diagnosis ED Disposition    ED Disposition Condition [place]: [facility] [[id number]]  Level of Care: Med-Surg [16]  Diagnosis: Bowel obstruction [facility]) [700174]  Admitting Physician: Rhetta Mura [[id number]]  Attending Physician: Rhetta Mura [[id number]]  PT Class (Do Not Modify): Observation [104]  PT Acc Code (Do Not Modify): Observation [10022]       Medical History Past Medical History:  Diagnosis Date  . ANXIETY 12/31/2009  . Aortic insufficiency    a. mod by echo 2017.  [place] CAD (coronary artery disease)    a. s/p CABGx3 and LAA clipping in 12/2014, NSTEMI 05/2015 s/p DES to native RCA and SVG-dRCA; occluded ramus-SVG was treated medically). c. neg [facility]uc 10/2015 at [facility].  . Chronic diastolic CHF (congestive heart failure) ([place])   . Chronic fatigue   . CKD (chronic kidney disease), stage II   . DEPRESSION 12/31/2009  . [place] DISEASE, CERVICAL 12/31/2009  . [place] DISEASE, LUMBAR 12/31/2009  . DIVERTICULITIS, HX OF 12/31/2009  . Essential hypertension   . Gait abnormality 03/25/2017  . GLUCOSE INTOLERANCE 12/31/2009  . Habitual alcohol use   . History of stroke    self reports " they say  i have had some pin strokes"   . Hypercalcemia   . Hyperkalemia   . Hyperlipidemia 12/31/2009  . Hypothyroidism   . MENOPAUSE, EARLY 12/31/2009  . NSTEMI (non-ST elevated myocardial infarction) ([place]) 05/11/2015  . Orthostatic hypotension   . OSTEOARTHRITIS, HAND   . Pacemaker 01/21/2012   MDT Adapta dual chamber  . Paroxysmal atrial flutter ([place])   . Persistent atrial fibrillation   . PVD (peripheral vascular disease), hx stents to bil SFAs 02/2010   . Rash, skin     superficial raised red pencil point sized rash bilateral forearms; states " it started when i started the Plavix "   . Ruptured left  breast implant   . Symptomatic sinus bradycardia 01/22/2012  . Syncope    a. 11/2015 ? due to medications.  . Syncope    reports on 05-10-17 " i passed out 2 months ago in the bathroom and broke some ribs"    . Tachy-brady syndrome ([place])    a. s/p MDT PPM 2013.  . Tricuspid regurgitation     Allergies Allergies  Allergen Reactions  . Brilinta [Ticagrelor] Shortness Of Breath  . Clonidine Derivatives Other (See Comments)    Lowers heart rate  . Atorvastatin Other (See Comments)    Possible cause of fatigue/malaise  . Crestor [Rosuvastatin Calcium] Other (See Comments)    Joint/muscle aches  . Exforge [Amlodipine Besylate-Valsartan] Itching and Rash  . Spironolactone Other (See Comments)    Contraindicated with history of hyperkalemia    IV Location/Drains/Wounds Patient Lines/Drains/Airways Status   Active Line/Drains/Airways    Name:   Placement date:   Placement time:   Site:   Days:   Peripheral IV 02/07/18 Left Antecubital   02/07/18    0500    Antecubital   less than 1   Closed System Drain 1 Left Breast Bulb (JP) 15 Fr.   01/14/18    1145    Breast   24   Closed System Drain 2 Right Breast Bulb (JP) 15 Fr.   01/14/18    1224    Breast   24   Incision (Closed) 05/13/17 Neck Other (Comment)   05/13/17    1425     270   Incision (Closed) 01/14/18 Breast Left   01/14/18    1131     24   Incision (Closed) 01/14/18 Breast Right   01/14/18    1131     24          Labs/Imaging Results for orders placed or performed during the hospital encounter of 02/07/18 (from the past 48 hour(s))  Lipase, blood     Status: None   Collection Time: 02/07/18  6:27 AM  Result Value Ref Range   Lipase 31 11 - 51 U/L    Comment: Performed at [facility], [place] [address], [place], [place] [address]  Comprehensive metabolic panel     Status: Abnormal   Collection Time: 02/07/18  6:27 AM  Result Value Ref Range   Sodium 137 135 - 145 mmol/L   Potassium 3.6 3.5 - 5.1 mmol/L    Chloride 103 98 - 111 mmol/L   CO2 22 22 - 32 mmol/L   Glucose, Bld 160 (H) 70 - 99 mg/dL   BUN 17 8 - 23 mg/dL   Creatinine, Ser 0.95 0.44 - 1.00 mg/dL   Calcium 9.5 8.9 - 10.3 mg/dL   Total Protein 7.6 6.5 - 8.1 g/dL   Albumin 4.5 3.5 - 5.0  g/dL   AST 24 15 - 41 U/L   ALT 17 0 - 44 U/L   Alkaline Phosphatase 84 38 - 126 U/L   Total Bilirubin 1.0 0.3 - 1.2 mg/dL   GFR calc non Af Amer 58 (L) >60 mL/min   GFR calc Af Amer >60 >60 mL/min   Anion gap 12 5 - 15    Comment: Performed at [facility], [place] [address], [place], [place] [address]  CBC with Differential     Status: Abnormal   Collection Time: 02/07/18  6:27 AM  Result Value Ref Range   WBC 13.0 (H) 4.0 - 10.5 K/uL   RBC 5.50 (H) 3.87 - 5.11 MIL/uL   Hemoglobin 16.7 (H) 12.0 - 15.0 g/dL   HCT 49.7 (H) 36.0 - 46.0 %   MCV 90.4 80.0 - 100.0 fL   MCH 30.4 26.0 - 34.0 pg   MCHC 33.6 30.0 - 36.0 g/dL   RDW 12.3 11.5 - 15.5 %   Platelets 303 150 - 400 K/uL   nRBC 0.0 0.0 - 0.2 %   Neutrophils Relative % 88 %   Neutro Abs 11.3 (H) 1.7 - 7.7 K/uL   Lymphocytes Relative 6 %   Lymphs Abs 0.8 0.7 - 4.0 K/uL   Monocytes Relative 5 %   Monocytes Absolute 0.7 0.1 - 1.0 K/uL   Eosinophils Relative 1 %   Eosinophils Absolute 0.1 0.0 - 0.5 K/uL   Basophils Relative 0 %   Basophils Absolute 0.1 0.0 - 0.1 K/uL   Immature Granulocytes 0 %   Abs Immature Granulocytes 0.03 0.00 - 0.07 K/uL    Comment: Performed at [facility], [place] [address], [place], [place] [address]  Magnesium     Status: None   Collection Time: 02/07/18  6:27 AM  Result Value Ref Range   Magnesium 2.4 1.7 - 2.4 mg/dL    Comment: Performed at [facility], [place] [address] [place], [place] [address]  I-Stat CG4 Lactic Acid, ED     Status: Abnormal   Collection Time: 02/07/18  6:42 AM  Result Value Ref Range   Lactic Acid, Venous 2.02 (HH) 0.5 - 1.9 mmol/L   Comment NOTIFIED PHYSICIAN   Urinalysis,  Routine w reflex microscopic     Status: Abnormal   Collection Time: 02/07/18  9:46 AM  Result Value Ref Range   Color, Urine AMBER (A) YELLOW    Comment: BIOCHEMICALS MAY BE AFFECTED BY COLOR   APPearance CLEAR CLEAR   Specific Gravity, Urine 1.040 (H) 1.005 - 1.030   pH 5.0 5.0 - 8.0   Glucose, UA NEGATIVE NEGATIVE mg/dL   Hgb urine dipstick NEGATIVE NEGATIVE   Bilirubin Urine NEGATIVE NEGATIVE   Ketones, ur 20 (A) NEGATIVE mg/dL   Protein, ur 30 (A) NEGATIVE mg/dL   Nitrite NEGATIVE NEGATIVE   Leukocytes, UA NEGATIVE NEGATIVE   RBC / HPF 21-50 0 - 5 RBC/hpf   WBC, UA 0-5 0 - 5 WBC/hpf   Bacteria, UA RARE (A) NONE SEEN   Mucus PRESENT    Hyaline Casts, UA PRESENT     Comment: Performed at [facility], [place] [address] [place], [place] [address]  I-Stat CG4 Lactic Acid, ED     Status: Abnormal   Collection Time: 02/07/18 11:45 AM  Result Value Ref Range   Lactic Acid, Venous 2.13 (HH) 0.5 - 1.9 mmol/L   Comment NOTIFIED PHYSICIAN    Ct Abdomen Pelvis W  Contrast  Result Date: 02/07/2018 CLINICAL DATA:  Abdominal distention. Breast implants removed 2 weeks ago. No bowel movement for the past few days. EXAM: CT ABDOMEN AND PELVIS WITH CONTRAST TECHNIQUE: Multidetector CT imaging of the abdomen and pelvis was performed using the standard protocol following bolus administration of intravenous contrast. CONTRAST:  136mL ISOVUE-300 IOPAMIDOL (ISOVUE-300) INJECTION 61% COMPARISON:  05/11/2015 FINDINGS: Lower chest: Limited coverage of the lower chest without acute finding. Pacer leads seen into the right ventricle. Hepatobiliary: Incomplete coverage of the high right liver. No focal hepatic abnormality.No evidence of biliary obstruction or stone. Pancreas: Unremarkable. Spleen: Unremarkable. Adrenals/Urinary Tract: 13 mm right adrenal nodule that is stable from 01/01/2010 and most consistent with benign adenoma. No hydronephrosis or stone. Unremarkable bladder.  Stomach/Bowel: Desiccated/lamellated stool at the upper rectum with distal decompression and more proximal dilatation by gas and fluid levels. The postoperative sigmoid colon is the most dilated. No adjacent transition point is seen. The small bowel is not dilated. There is mild mesenteric stranding at the level of the rectal stool ball as described, likely from pressure. Sigmoid diverticulosis. Vascular/Lymphatic: Severe atherosclerosis with irregular plaque seen throughout the distal aorta and bilateral iliacs. High-grade narrowing at the proximal left superficial femoral artery. There is history of lower extremity angiogram earlier this year. No mass or adenopathy. Reproductive:Hysterectomy Other: No ascites or pneumoperitoneum. Musculoskeletal: No acute abnormalities. Disc degeneration with L3-4 retrolisthesis and kyphotic deformity the level of the upper lumbar spine. IMPRESSION: Dilated colon with fluid levels above a desiccated stool ball in the lower rectum which is presumably obstructive. Electronically Signed   By: Monte Fantasia M.D.   On: 02/07/2018 09:42   None  Pending Labs Unresulted Labs (From admission, onward)    Start     Ordered   02/08/18 1962  Basic metabolic panel  Tomorrow morning,   R     02/07/18 1326   02/08/18 0500  CBC  Tomorrow morning,   R     02/07/18 1326   02/08/18 0500  Magnesium  Tomorrow morning,   R     02/07/18 1327          Vitals/Pain Today's Vitals   02/07/18 1339 02/07/18 1400 02/07/18 1430 02/07/18 1500  BP:  (!) 143/70 (!) 163/76 (!) 168/80  Pulse:  82 85 89  Resp:  19 16 18   Temp:      TempSrc:      SpO2:  98% 92% 97%  Weight:      Height:      PainSc: 6        Isolation Precautions No active isolations  Medications Medications  iopamidol (ISOVUE-300) 61 % injection (has no administration in time range)  sodium chloride (PF) 0.9 % injection (has no administration in time range)  lactated ringers infusion ( Intravenous New Bag/Given  02/07/18 1339)  HYDROmorphone (DILAUDID) injection 0.5-1 mg (has no administration in time range)  ondansetron (ZOFRAN) injection 4 mg (has no administration in time range)  ondansetron (ZOFRAN) injection 4 mg (4 mg Intravenous Given 02/07/18 0640)  lactated ringers bolus 1,000 mL (0 mLs Intravenous Stopped 02/07/18 0851)  fentaNYL (SUBLIMAZE) injection 50 mcg (50 mcg Intravenous Given 02/07/18 0727)  iopamidol (ISOVUE-300) 61 % injection 100 mL (100 mLs Intravenous Contrast Given 02/07/18 0918)  HYDROmorphone (DILAUDID) injection 0.5 mg (0.5 mg Intravenous Given 02/07/18 0959)  sorbitol, milk of mag, mineral oil, glycerin (SMOG) enema (960 mLs Rectal Given 02/07/18 1019)  metoCLOPramide (REGLAN) injection 10 mg (10 mg Intravenous Given 02/07/18  1135)  HYDROmorphone (DILAUDID) injection 1 mg (1 mg Intravenous Given 02/07/18 1227)    Mobility walks with person assist

## 2018-02-07 NOTE — ED Notes (Signed)
Patient does not want to do the soap suds enema at this time.

## 2018-02-07 NOTE — ED Notes (Signed)
Bladder Scan-28mL

## 2018-02-07 NOTE — ED Notes (Signed)
Pt oxygen saturation noted to be 68%; placed on 2 lpm [place] and encouraged to take deep breaths through her nose; oxygen saturation improving and pt comfortable/resting. Will continue to monitor.   Primary nurse notified of intervention.

## 2018-02-07 NOTE — ED Provider Notes (Signed)
Emergency Department Provider Note   I have reviewed the triage vital signs and the nursing notes.   HISTORY  Chief Complaint Abdominal Pain   HPI [name] is a 77 y.o. female with multiple medical problems documented below the presents emergency department today secondary to abdominal pain and distention.  Patient states that she had silicone implants removed a couple weeks ago and since then she has had abnormal bowel movements.  She states that she has not had a normal bowel movement least the last 3 days he said progressively worsening abdominal distention at that time as well.  She states she has not passed any gas either.  She is tried Colace, MiraLAX and enemas without relief.  She did not note any stool in her rectal vault while doing the enemas.  She states she has a history of a colon resection secondary to diverticulitis over 10 years ago.  No fevers.  Some nausea no vomiting.  Decreased appetite.  No urinary symptoms besides decreased urine. No other associated or modifying symptoms.    Past Medical History:  Diagnosis Date  . ANXIETY 12/31/2009  . Aortic insufficiency    a. mod by echo 2017.  Marland Kitchen CAD (coronary artery disease)    a. s/p CABGx3 and LAA clipping in 12/2014, NSTEMI 05/2015 s/p DES to native RCA and SVG-dRCA; occluded ramus-SVG was treated medically). c. neg nuc 10/2015 at [facility].  . Chronic diastolic CHF (congestive heart failure) ([place])   . Chronic fatigue   . CKD (chronic kidney disease), stage II   . DEPRESSION 12/31/2009  . [place] DISEASE, CERVICAL 12/31/2009  . [place] DISEASE, LUMBAR 12/31/2009  . DIVERTICULITIS, HX OF 12/31/2009  . Essential hypertension   . Gait abnormality 03/25/2017  . GLUCOSE INTOLERANCE 12/31/2009  . Habitual alcohol use   . History of stroke    self reports " they say i have had some pin strokes"   . Hypercalcemia   . Hyperkalemia   . Hyperlipidemia 12/31/2009  . Hypothyroidism   . MENOPAUSE, EARLY 12/31/2009  . NSTEMI  (non-ST elevated myocardial infarction) ([place]) 05/11/2015  . Orthostatic hypotension   . OSTEOARTHRITIS, HAND   . Pacemaker 01/21/2012   MDT Adapta dual chamber  . Paroxysmal atrial flutter ([place])   . Persistent atrial fibrillation   . PVD (peripheral vascular disease), hx stents to bil SFAs 02/2010   . Rash, skin     superficial raised red pencil point sized rash bilateral forearms; states " it started when i started the Plavix "   . Ruptured left breast implant   . Symptomatic sinus bradycardia 01/22/2012  . Syncope    a. 11/2015 ? due to medications.  . Syncope    reports on 05-10-17 " i passed out 2 months ago in the bathroom and broke some ribs"    . Tachy-brady syndrome ([place])    a. s/p MDT PPM 2013.  . Tricuspid regurgitation     Patient Active Problem List   Diagnosis Date Noted  . Hypothyroidism (acquired) 02/08/2018  . Large bowel obstruction ([place]) 02/07/2018  . Coronary artery disease involving coronary bypass graft of native heart without angina pectoris 10/22/2017  . Dyslipidemia 10/22/2017  . Cerebrovascular small vessel disease 04/27/2017  . Gait abnormality 03/25/2017  . Cerebrovascular disease 03/25/2017  . Claudication in peripheral vascular disease ([place]) 02/15/2017  . Encounter for monitoring dofetilide therapy 09/25/2016  . Hyperparathyroidism ([place]) 05/13/2016  . Pacemaker 01/10/2016  . Heme positive stool   . Benign neoplasm  of ascending colon   . Long term current use of anticoagulant   . Gastrointestinal hemorrhage 12/17/2015  . Acute blood loss anemia 12/17/2015  . CKD (chronic kidney disease), stage II   . Syncope 12/05/2015  . Hypotension 12/05/2015  . Hyperkalemia 12/04/2015  . H/O amiodarone therapy 12/04/2015  . Chronic diastolic heart failure ([place]) 10/19/2015  . Hypercalcemia 10/19/2015  . Atypical atrial flutter ([place])   . Paroxysmal atrial flutter ([place]) 08/23/2015  . Febrile illness, acute 06/27/2015  . Chronic fatigue 06/27/2015  . Stented  coronary artery   . NSTEMI (non-ST elevated myocardial infarction) ([place]) 05/11/2015  . S/P CABG x 3 01/04/15 01/10/2015  . CAD (coronary artery disease) 01/04/2015  . Symptomatic sinus bradycardia 01/22/2012  . PAF (paroxysmal atrial fibrillation) ([place]) 01/22/2012  . Sinus node dysfunction ([place]) 01/22/2012  . S/P placement of cardiac pacemaker, medtronic adapta 01/21/12 01/22/2012  . PVD (peripheral vascular disease), hx stents to bil SFAs 02/2010 01/22/2012  . Hyperlipidemia 12/31/2009  . Anxiety 12/31/2009  . DEPRESSION 12/31/2009  . Essential hypertension 12/31/2009  . Coronary atherosclerosis 12/31/2009  . OSTEOARTHRITIS, HAND 12/31/2009  . [place] DISEASE, CERVICAL 12/31/2009  . [place] DISEASE, LUMBAR 12/31/2009  . UNSPECIFIED URINARY INCONTINENCE 12/31/2009  . Abdominal pain 12/31/2009    Past Surgical History:  Procedure Laterality Date  . ABDOMINAL AORTOGRAM W/LOWER EXTREMITY Bilateral 02/15/2017   Procedure: ABDOMINAL AORTOGRAM W/LOWER EXTREMITY;  Surgeon: Lorretta Harp, MD;  Location: [place] [facility];  Service: Cardiovascular;  Laterality: Bilateral;  bilat  . Ablation of typical atrial flutter (CTI line)  09/05/2016   Dr Antonieta Pert at [facility]   . AUGMENTATION MAMMAPLASTY  1983  . BREAST IMPLANT REMOVAL Bilateral 01/14/2018   Procedure: REMOVAL BILATERAL BREAST IMPLANTS;  Surgeon: Irene Limbo, MD;  Location: [place];  Service: Plastics;  Laterality: Bilateral;  . CAPSULECTOMY Bilateral 01/14/2018   Procedure: CAPSULECTOMY;  Surgeon: Irene Limbo, MD;  Location: [place];  Service: Plastics;  Laterality: Bilateral;  . CARDIAC CATHETERIZATION N/A 01/01/2015   Procedure: Left Heart Cath and Coronary Angiography;  Surgeon: Jettie Booze, MD;  Location: [place] [facility];  Service: Cardiovascular;  Laterality: N/A;  . CARDIAC CATHETERIZATION N/A 05/12/2015   Procedure: Left Heart Cath and Coronary Angiography;  Surgeon: Troy Sine, MD;  Location: [place] [facility];  Service: Cardiovascular;  Laterality: N/A;  . CARDIAC CATHETERIZATION N/A 05/12/2015   Procedure: Coronary Stent Intervention;  Surgeon: Troy Sine, MD;  Location: [place] [facility];  Service: Cardiovascular;  Laterality: N/A;  . CARDIOVERSION N/A 02/01/2015   Procedure: CARDIOVERSION;  Surgeon: Lelon Perla, MD;  Location: [facility];  Service: Cardiovascular;  Laterality: N/A;  . CARDIOVERSION N/A 08/30/2015   Procedure: CARDIOVERSION;  Surgeon: Sanda Klein, MD;  Location: [place] ENDOSCOPY;  Service: Cardiovascular;  Laterality: N/A;  . CARPAL TUNNEL RELEASE  2008   "right hand/thumb; carpal tunnel repair; got rid of arthritis" (01/21/2012)  . CLIPPING OF ATRIAL APPENDAGE N/A 01/04/2015   Procedure: CLIPPING OF ATRIAL APPENDAGE;  Surgeon: Grace Isaac, MD;  Location: [place];  Service: Open Heart Surgery;  Laterality: N/A;  . COLONOSCOPY N/A 12/21/2015   Procedure: COLONOSCOPY;  Surgeon: Milus Banister, MD;  Location: [facility] ENDOSCOPY;  Service: Endoscopy;  Laterality: N/A;  . CORONARY ARTERY BYPASS GRAFT N/A 01/04/2015   Procedure: CORONARY ARTERY BYPASS GRAFTING (CABG) x 3 using left internal mammory artery and greater saphenous vein right leg harvested endoscopically.;  Surgeon: Lilia Argue  Servando Snare, MD;  LIMA-LAD, SVG-RI, SVG-PDA  . ESOPHAGOGASTRODUODENOSCOPY (EGD) WITH PROPOFOL N/A 12/18/2015   Procedure: ESOPHAGOGASTRODUODENOSCOPY (EGD) WITH PROPOFOL;  Surgeon: Milus Banister, MD;  Location: [facility] ENDOS[facility];  Service: Endoscopy;  Laterality: N/A;  . FACELIFT, LOWER 2/3  1995   "mini" (01/21/2012)  . GIVENS CAPSULE STUDY N/A 12/21/2015   Procedure: GIVENS CAPSULE STUDY;  Surgeon: Milus Banister, MD;  Location: [facility];  Service: Endoscopy;  Laterality: N/A;  . Lower Arterial Examination  10/28/2011   R. SFA stent mild-moderate mixed density plaque with elevated velocities consistent with 50% diameter reduction. L. SFA stent moderate mixed  denisty plaque at mid to distal level consistent with 50-69% diameter reduction.  . LOWER EXTREMITY ANGIOGRAPHY N/A 02/15/2017   Procedure: LOWER EXTREMITY ANGIOGRAPHY;  Surgeon: Lorretta Harp, MD;  Location: [facility] [facility];  Service: Cardiovascular;  Laterality: N/A;  . OOPHORECTOMY  ~1979  . PARATHYROIDECTOMY N/A 05/13/2017   Procedure: PARATHYROIDECTOMY;  Surgeon: Armandina Gemma, MD;  Location: [facility];  Service: General;  Laterality: N/A;  . PARTIAL COLECTOMY  2010  . PERIPHERAL ARTERIAL STENT GRAFT  2012; 2012   "LLE; RLE" (01/21/2012)  . PERIPHERAL VASCULAR BALLOON ANGIOPLASTY Right 02/15/2017   Procedure: PERIPHERAL VASCULAR BALLOON ANGIOPLASTY;  Surgeon: Lorretta Harp, MD;  Location: [place] [facility];  Service: Cardiovascular;  Laterality: Right;  SFA    . PERMANENT PACEMAKER INSERTION N/A 01/21/2012   Medtronic Adapta L implanted by Dr Sallyanne Kuster for tachy/brady syndrome  . POSTERIOR CERVICAL LAMINECTOMY  1985  . TEE WITHOUT CARDIOVERSION N/A 01/04/2015   Procedure: TRANSESOPHAGEAL ECHOCARDIOGRAM (TEE);  Surgeon: Grace Isaac, MD;  Location: [place];  Service: Open Heart Surgery;  Laterality: N/A;  . TEE WITHOUT CARDIOVERSION N/A 02/01/2015   Procedure: TRANSESOPHAGEAL ECHOCARDIOGRAM (TEE);  Surgeon: Lelon Perla, MD;  Location: [facility] ENDOSCOPY;  Service: Cardiovascular;  Laterality: N/A;  . TEE WITHOUT CARDIOVERSION N/A 08/30/2015   Procedure: TRANSESOPHAGEAL ECHOCARDIOGRAM (TEE);  Surgeon: Sanda Klein, MD;  Location: [facility] ENDOSCOPY;  Service: Cardiovascular;  Laterality: N/A;  . Mart [ticagrelor]; Clonidine derivatives; Atorvastatin; Crestor [rosuvastatin calcium]; Exforge [amlodipine besylate-valsartan]; and Spironolactone  Family History  Problem Relation Age of Onset  . Heart disease Mother   . Hypertension Mother   . Stroke Mother   . Stroke Father   . Heart disease Father   . Heart disease Brother   .  Hypertension Brother        2 brothers  . CVA Brother        2 brothers  . Heart attack Brother        2 brothers    Social History Social History   Tobacco Use  . Smoking status: Former Smoker    Packs/day: 0.75    Years: 10.00    Pack years: 7.50    Types: Cigarettes    Last attempt to quit: 2008    Years since quitting: 12.0  . Smokeless tobacco: Never Used  . Tobacco comment: 01/21/2012 "quit smoking ~ 2002"  Substance Use Topics  . Alcohol use: Yes    Comment: daily coctail  . Drug use: No    Review of Systems  All other systems negative except as documented in the HPI. All pertinent positives and negatives as reviewed in the HPI. ____________________________________________   PHYSICAL EXAM:  VITAL SIGNS: ED Triage Vitals  Enc Vitals Group     BP 02/07/18 0544 (!) 173/78  Pulse Rate 02/07/18 0544 75     Resp 02/07/18 0544 16     Temp 02/07/18 0544 98.7 F (37.1 C)     Temp Source 02/07/18 0544 Oral     SpO2 02/07/18 0538 99 %     Weight 02/07/18 0544 151 lb (68.5 kg)     Height 02/07/18 0544 5\' 5"  (1.651 m)    Constitutional: Alert and oriented. Well appearing and in no acute distress. Eyes: Conjunctivae are normal. PERRL. EOMI. Head: Atraumatic. Nose: No congestion/rhinnorhea. Mouth/Throat: Mucous membranes are moist.  Oropharynx non-erythematous. Neck: No stridor.  No meningeal signs.   Cardiovascular: Normal rate, regular rhythm. Good peripheral circulation. Grossly normal heart sounds.   Respiratory: Normal respiratory effort.  No retractions. Lungs CTAB. Gastrointestinal: Soft and diffusely tender, worse in suprapubic region. Mild distension. Tympanic to percussion.   Musculoskeletal: No lower extremity tenderness nor edema. No gross deformities of extremities. Neurologic:  Normal speech and language. No gross focal neurologic deficits are appreciated.  Skin:  Skin is warm, dry and intact. No rash  noted.  ____________________________________________   LABS (all labs ordered are listed, but only abnormal results are displayed)  Labs Reviewed  COMPREHENSIVE METABOLIC PANEL - Abnormal; Notable for the following components:      Result Value   Glucose, Bld 160 (*)    GFR calc non Af Amer 58 (*)    All other components within normal limits  URINALYSIS, ROUTINE W REFLEX MICROSCOPIC - Abnormal; Notable for the following components:   Color, Urine AMBER (*)    Specific Gravity, Urine 1.040 (*)    Ketones, ur 20 (*)    Protein, ur 30 (*)    Bacteria, UA RARE (*)    All other components within normal limits  CBC WITH DIFFERENTIAL/PLATELET - Abnormal; Notable for the following components:   WBC 13.0 (*)    RBC 5.50 (*)    Hemoglobin 16.7 (*)    HCT 49.7 (*)    Neutro Abs 11.3 (*)    All other components within normal limits  BASIC METABOLIC PANEL - Abnormal; Notable for the following components:   Glucose, Bld 125 (*)    Calcium 8.6 (*)    GFR calc non Af Amer 57 (*)    All other components within normal limits  MAGNESIUM - Abnormal; Notable for the following components:   Magnesium 2.5 (*)    All other components within normal limits  I-STAT CG4 LACTIC ACID, ED - Abnormal; Notable for the following components:   Lactic Acid, Venous 2.02 (*)    All other components within normal limits  I-STAT CG4 LACTIC ACID, ED - Abnormal; Notable for the following components:   Lactic Acid, Venous 2.13 (*)    All other components within normal limits  LIPASE, BLOOD  MAGNESIUM  CBC   ____________________________________________   RADIOLOGY  Ct Abdomen Pelvis W Contrast  Result Date: 02/07/2018 CLINICAL DATA:  Abdominal distention. Breast implants removed 2 weeks ago. No bowel movement for the past few days. EXAM: CT ABDOMEN AND PELVIS WITH CONTRAST TECHNIQUE: Multidetector CT imaging of the abdomen and pelvis was performed using the standard protocol following bolus administration  of intravenous contrast. CONTRAST:  150mL ISOVUE-300 IOPAMIDOL (ISOVUE-300) INJECTION 61% COMPARISON:  05/11/2015 FINDINGS: Lower chest: Limited coverage of the lower chest without acute finding. Pacer leads seen into the right ventricle. Hepatobiliary: Incomplete coverage of the high right liver. No focal hepatic abnormality.No evidence of biliary obstruction or stone. Pancreas: Unremarkable. Spleen: Unremarkable. Adrenals/Urinary Tract:  13 mm right adrenal nodule that is stable from 01/01/2010 and most consistent with benign adenoma. No hydronephrosis or stone. Unremarkable bladder. Stomach/Bowel: Desiccated/lamellated stool at the upper rectum with distal decompression and more proximal dilatation by gas and fluid levels. The postoperative sigmoid colon is the most dilated. No adjacent transition point is seen. The small bowel is not dilated. There is mild mesenteric stranding at the level of the rectal stool ball as described, likely from pressure. Sigmoid diverticulosis. Vascular/Lymphatic: Severe atherosclerosis with irregular plaque seen throughout the distal aorta and bilateral iliacs. High-grade narrowing at the proximal left superficial femoral artery. There is history of lower extremity angiogram earlier this year. No mass or adenopathy. Reproductive:Hysterectomy Other: No ascites or pneumoperitoneum. Musculoskeletal: No acute abnormalities. Disc degeneration with L3-4 retrolisthesis and kyphotic deformity the level of the upper lumbar spine. IMPRESSION: Dilated colon with fluid levels above a desiccated stool ball in the lower rectum which is presumably obstructive. Electronically Signed   By: Monte Fantasia M.D.   On: 02/07/2018 09:42    ____________________________________________   PROCEDURES  Procedure(s) performed:   Procedures   ____________________________________________   INITIAL IMPRESSION / ASSESSMENT AND PLAN / ED COURSE  Patient found to have a large amount of desiccated  stool in her colon.  Not able to reach it with my fingers to do a manual disimpaction.  Patient given multiple treatments in the emergency room without improvement in her symptoms.  Discussed with hospitalist who will admit for bowel cleanout and enemas.     Pertinent labs & imaging results that were available during my care of the patient were reviewed by me and considered in my medical decision making (see chart for details).  ____________________________________________  FINAL CLINICAL IMPRESSION(S) / ED DIAGNOSES  Final diagnoses:  Complete intestinal obstruction, unspecified cause ([place])     MEDICATIONS GIVEN DURING THIS VISIT:  Medications  iopamidol (ISOVUE-300) 61 % injection (has no administration in time range)  sodium chloride (PF) 0.9 % injection (has no administration in time range)  lactated ringers infusion ( Intravenous New Bag/Given 02/07/18 2244)  HYDROmorphone (DILAUDID) injection 0.5-1 mg (has no administration in time range)  ondansetron (ZOFRAN) injection 4 mg (has no administration in time range)  ondansetron (ZOFRAN) injection 4 mg (4 mg Intravenous Given 02/07/18 0640)  lactated ringers bolus 1,000 mL (0 mLs Intravenous Stopped 02/07/18 0851)  fentaNYL (SUBLIMAZE) injection 50 mcg (50 mcg Intravenous Given 02/07/18 0727)  iopamidol (ISOVUE-300) 61 % injection 100 mL (100 mLs Intravenous Contrast Given 02/07/18 0918)  HYDROmorphone (DILAUDID) injection 0.5 mg (0.5 mg Intravenous Given 02/07/18 0959)  sorbitol, milk of mag, mineral oil, glycerin (SMOG) enema (960 mLs Rectal Given 02/07/18 1019)  metoCLOPramide (REGLAN) injection 10 mg (10 mg Intravenous Given 02/07/18 1135)  HYDROmorphone (DILAUDID) injection 1 mg (1 mg Intravenous Given 02/07/18 1227)     NEW OUTPATIENT MEDICATIONS STARTED DURING THIS VISIT:  Current Discharge Medication List      Note:  This note was prepared with assistance of Dragon voice recognition software. Occasional wrong-word  or sound-a-like substitutions may have occurred due to the inherent limitations of voice recognition software.  Merrily Pew, MD 02/08/18 [phone]

## 2018-02-08 ENCOUNTER — Observation Stay (HOSPITAL_COMMUNITY): Payer: Medicare HMO

## 2018-02-08 DIAGNOSIS — I480 Paroxysmal atrial fibrillation: Secondary | ICD-10-CM | POA: Diagnosis not present

## 2018-02-08 DIAGNOSIS — K56609 Unspecified intestinal obstruction, unspecified as to partial versus complete obstruction: Secondary | ICD-10-CM

## 2018-02-08 DIAGNOSIS — E039 Hypothyroidism, unspecified: Secondary | ICD-10-CM | POA: Diagnosis present

## 2018-02-08 DIAGNOSIS — I10 Essential (primary) hypertension: Secondary | ICD-10-CM | POA: Diagnosis not present

## 2018-02-08 DIAGNOSIS — R197 Diarrhea, unspecified: Secondary | ICD-10-CM | POA: Diagnosis not present

## 2018-02-08 LAB — CBC
HCT: 42.8 % (ref 36.0–46.0)
Hemoglobin: 13.7 g/dL (ref 12.0–15.0)
MCH: 31.1 pg (ref 26.0–34.0)
MCHC: 32 g/dL (ref 30.0–36.0)
MCV: 97.1 fL (ref 80.0–100.0)
Platelets: 230 10*3/uL (ref 150–400)
RBC: 4.41 MIL/uL (ref 3.87–5.11)
RDW: 13.1 % (ref 11.5–15.5)
WBC: 8.2 10*3/uL (ref 4.0–10.5)
nRBC: 0 % (ref 0.0–0.2)

## 2018-02-08 LAB — BASIC METABOLIC PANEL
Anion gap: 8 (ref 5–15)
BUN: 23 mg/dL (ref 8–23)
CO2: 27 mmol/L (ref 22–32)
Calcium: 8.6 mg/dL — ABNORMAL LOW (ref 8.9–10.3)
Chloride: 105 mmol/L (ref 98–111)
Creatinine, Ser: 0.96 mg/dL (ref 0.44–1.00)
GFR calc Af Amer: >60 mL/min (ref >60)
GFR calc non Af Amer: 57 mL/min — ABNORMAL LOW (ref >60)
Glucose, Bld: 125 mg/dL — ABNORMAL HIGH (ref 70–99)
Potassium: 4.7 mmol/L (ref 3.5–5.1)
Sodium: 140 mmol/L (ref 135–145)

## 2018-02-08 LAB — MAGNESIUM: Magnesium: 2.5 mg/dL — ABNORMAL HIGH (ref 1.7–2.4)

## 2018-02-08 MED ORDER — TRAMADOL HCL 50 MG PO TABS: 50.0000 mg | ORAL_TABLET | Freq: Four times a day (QID) | ORAL | 0 refills | Status: DC | PRN

## 2018-02-08 MED ORDER — SENNOSIDES-DOCUSATE SODIUM 8.6-50 MG PO TABS: 1.0000 | ORAL_TABLET | Freq: Two times a day (BID) | ORAL | Status: DC

## 2018-02-08 MED ORDER — SENNOSIDES-DOCUSATE SODIUM 8.6-50 MG PO TABS: 1.0000 | ORAL_TABLET | Freq: Two times a day (BID) | ORAL | 0 refills | Status: DC

## 2018-02-08 MED ORDER — DOFETILIDE 250 MCG PO CAPS
250.0000 ug | ORAL_CAPSULE | Freq: Two times a day (BID) | ORAL | Status: DC
  Administered 2018-02-08: 250 ug via ORAL
  Filled 2018-02-08 (×2): qty 1

## 2018-02-08 MED ORDER — ONDANSETRON 4 MG PO TBDP: 4.0000 mg | ORAL_TABLET | Freq: Three times a day (TID) | ORAL | 0 refills | Status: DC | PRN

## 2018-02-08 MED ORDER — POLYETHYLENE GLYCOL 3350 17 G PO PACK: 17.0000 g | PACK | Freq: Every day | ORAL | 0 refills | Status: DC

## 2018-02-08 MED ORDER — AMLODIPINE BESYLATE 5 MG PO TABS
5.0000 mg | ORAL_TABLET | Freq: Every day | ORAL | Status: DC
  Administered 2018-02-08: 5 mg via ORAL
  Filled 2018-02-08: qty 1

## 2018-02-08 NOTE — Discharge Summary (Signed)
Physician Discharge Summary  [name] JSE:[id number] DOB: May 03, 1940 DOA: 02/07/2018  PCP: Patient, No Pcp Per  Admit date: 02/07/2018 Discharge date: 02/08/2018  Admitted From: Home  disposition: Home  Recommendations for Outpatient Follow-up:  1. Follow up with PCP in 1 week 2. Follow-up with general surgery as an outpatient 3. Follow-up in the ED if symptoms worsen or new appear   Home Health: No Equipment/Devices: None  Discharge Condition: Stable CODE STATUS: Full Diet recommendation: Heart Healthy    Brief/Interim Summary: 77 year old female with history of chronic diastolic heart failure, diverticulosis status post partial colectomy with reanastomosis in 2010, hypertension presented on 02/07/2018 with abdominal pain.  CT of the abdomen pelvis showed dilated colon with fluid levels above are desiccated stool ball in lower rectum which appears obstructive.  During the hospitalization, she was given enema and she had a bowel movement.  Initially she was kept n.p.o.  She was started on clear liquid diet.  X-ray of the abdomen done today showed no evidence of obstruction.  She will be discharged home if she tolerates soft diet today.  Discharge Diagnoses:  Principal Problem:   Large bowel obstruction ([place]) Active Problems:   Essential hypertension   Abdominal pain   PAF (paroxysmal atrial fibrillation) (HCC)   Hypothyroidism (acquired)  Large bowel obstruction -Probably secondary to obstructing desiccated stool ball in the lower rectum as per CT scan  -Patient had bowel movement after enema was given. -She is tolerating clear liquid diet this morning.  X-ray of the abdomen does not show any evidence of bowel obstruction.  She will be discharged home today if she tolerates soft diet today.  She might need outpatient general surgery evaluation  Paroxysmal atrial fibrillation -Rate controlled.  Continue Tikosyn.  She is not on any anticoagulation at this moment.  Outpatient  follow-up with PCP regarding the same  Chronic diastolic heart failure  -Compensated.  Echo showed EF of 55 to 60% in August 2018.  Outpatient follow-up.  Continue Lasix on an as-needed basis  Essential hypertension  -Continue Norvasc on discharge.  Outpatient follow-up  Depression -Continue Celexa    Discharge Instructions  Discharge Instructions    Call MD for:  persistant nausea and vomiting   Complete by:  As directed    Call MD for:  severe uncontrolled pain   Complete by:  As directed    Diet - low sodium heart healthy   Complete by:  As directed    Increase activity slowly   Complete by:  As directed      Allergies as of 02/08/2018      Reactions   Brilinta [ticagrelor] Shortness Of Breath   Clonidine Derivatives Other (See Comments)   Lowers heart rate   Atorvastatin Other (See Comments)   Possible cause of fatigue/malaise   Crestor [rosuvastatin Calcium] Other (See Comments)   Joint/muscle aches   Exforge [amlodipine Besylate-valsartan] Itching, Rash   Spironolactone Other (See Comments)   Contraindicated with history of hyperkalemia      Medication List    TAKE these medications   alendronate 70 MG tablet Commonly known as:  FOSAMAX Take 70 mg by mouth every Thursday.   amLODipine 5 MG tablet Commonly known as:  NORVASC Take 5 mg by mouth daily.   cholecalciferol 1000 units tablet Commonly known as:  VITAMIN D Take 1,000 Units by mouth daily.   citalopram 20 MG tablet Commonly known as:  CELEXA Take 0.5 tablets (10 mg total) by mouth daily. Future refills  from PCP   Co Q-10 100 MG Caps Take 100 mg by mouth at bedtime.   dofetilide 250 MCG capsule Commonly known as:  TIKOSYN Take 250 mcg by mouth 2 (two) times daily.   furosemide 20 MG tablet Commonly known as:  LASIX Take 20 mg by mouth daily as needed for fluid or edema.   levothyroxine 25 MCG tablet Commonly known as:  SYNTHROID, LEVOTHROID Take 25-50 mcg by mouth daily before  breakfast. Take 1 tablet (25 mcg) by mouth Monday through Thursday, then take 2 tablets (50 mcg) on Friday, Saturday, & Sunday.   MAGNESIUM CITRATE PO Take 400 mg by mouth daily.   multivitamin with minerals Tabs tablet Take 2 tablets by mouth daily.   nitroGLYCERIN 0.4 MG SL tablet Commonly known as:  NITROSTAT Place 1 tablet (0.4 mg total) under the tongue every 5 (five) minutes x 3 doses as needed for chest pain.   ondansetron 4 MG disintegrating tablet Commonly known as:  ZOFRAN ODT Take 1 tablet (4 mg total) by mouth every 8 (eight) hours as needed for nausea or vomiting.   polyethylene glycol packet Commonly known as:  MIRALAX Take 17 g by mouth daily.   senna-docusate 8.6-50 MG tablet Commonly known as:  Senokot-S Take 1 tablet by mouth 2 (two) times daily.   SUPER OMEGA-3 1000 MG Caps Take 1,000 mg by mouth at bedtime.   traMADol 50 MG tablet Commonly known as:  ULTRAM Take 1 tablet (50 mg total) by mouth every 6 (six) hours as needed for moderate pain.   VITAMIN C PO Take 1 tablet by mouth daily.      Follow-up Information    Croitoru, Mihai, MD. Schedule an appointment as soon as possible for a visit in 1 week(s).   Specialty:  Cardiology Contact information: [address] [place] [address] [phone]        [facility]. Schedule an appointment as soon as possible for a visit in 1 week(s).   Specialty:  General Surgery Contact information: [address] [place] [address] [phone]          Allergies  Allergen Reactions  . Brilinta [Ticagrelor] Shortness Of Breath  . Clonidine Derivatives Other (See Comments)    Lowers heart rate  . Atorvastatin Other (See Comments)    Possible cause of fatigue/malaise  . Crestor [Rosuvastatin Calcium] Other (See Comments)    Joint/muscle aches  . Exforge [Amlodipine Besylate-Valsartan] Itching and Rash  . Spironolactone Other (See Comments)     Contraindicated with history of hyperkalemia    Consultations:  None   Procedures/Studies: Ct Abdomen Pelvis W Contrast  Result Date: 02/07/2018 CLINICAL DATA:  Abdominal distention. Breast implants removed 2 weeks ago. No bowel movement for the past few days. EXAM: CT ABDOMEN AND PELVIS WITH CONTRAST TECHNIQUE: Multidetector CT imaging of the abdomen and pelvis was performed using the standard protocol following bolus administration of intravenous contrast. CONTRAST:  161mL ISOVUE-300 IOPAMIDOL (ISOVUE-300) INJECTION 61% COMPARISON:  05/11/2015 FINDINGS: Lower chest: Limited coverage of the lower chest without acute finding. Pacer leads seen into the right ventricle. Hepatobiliary: Incomplete coverage of the high right liver. No focal hepatic abnormality.No evidence of biliary obstruction or stone. Pancreas: Unremarkable. Spleen: Unremarkable. Adrenals/Urinary Tract: 13 mm right adrenal nodule that is stable from 01/01/2010 and most consistent with benign adenoma. No hydronephrosis or stone. Unremarkable bladder. Stomach/Bowel: Desiccated/lamellated stool at the upper rectum with distal decompression and more proximal dilatation by gas  and fluid levels. The postoperative sigmoid colon is the most dilated. No adjacent transition point is seen. The small bowel is not dilated. There is mild mesenteric stranding at the level of the rectal stool ball as described, likely from pressure. Sigmoid diverticulosis. Vascular/Lymphatic: Severe atherosclerosis with irregular plaque seen throughout the distal aorta and bilateral iliacs. High-grade narrowing at the proximal left superficial femoral artery. There is history of lower extremity angiogram earlier this year. No mass or adenopathy. Reproductive:Hysterectomy Other: No ascites or pneumoperitoneum. Musculoskeletal: No acute abnormalities. Disc degeneration with L3-4 retrolisthesis and kyphotic deformity the level of the upper lumbar spine. IMPRESSION: Dilated  colon with fluid levels above a desiccated stool ball in the lower rectum which is presumably obstructive. Electronically Signed   By: Monte Fantasia M.D.   On: 02/07/2018 09:42   Dg Abd 2 Views  Result Date: 02/08/2018 CLINICAL DATA:  Abdominal discomfort and diarrhea. EXAM: ABDOMEN - 2 VIEW COMPARISON:  CT scan 02/07/2018 FINDINGS: The lung bases are clear. Scattered air throughout the small bowel and colon but no significant distension. No free air. IMPRESSION: Scattered air throughout the small bowel and colon but no significant distension. Electronically Signed   By: Marijo Sanes M.D.   On: 02/08/2018 11:02      Subjective: Patient seen and examined at bedside.  She feels better and had bowel movement yesterday.  She thinks that she will be able to tolerate diet and go home today.  No overnight fever or vomiting.  Discharge Exam: Vitals:   02/07/18 2123 02/08/18 0511  BP: 137/66 (!) 127/56  Pulse: 78 75  Resp: 16 16  Temp: 99.5 F (37.5 C) 98.5 F (36.9 C)  SpO2: 92% 91%   Vitals:   02/07/18 1500 02/07/18 1743 02/07/18 2123 02/08/18 0511  BP: (!) 168/80 (!) 166/79 137/66 (!) 127/56  Pulse: 89 84 78 75  Resp: 18 18 16 16   Temp:  98 F (36.7 C) 99.5 F (37.5 C) 98.5 F (36.9 C)  TempSrc:  Axillary Oral Oral  SpO2: 97% 98% 92% 91%  Weight:    69 kg  Height:        General: Pt is alert, awake, not in acute distress Cardiovascular: rate controlled, S1/S2 + Respiratory: bilateral decreased breath sounds at bases Abdominal: Soft, NT, ND, bowel sounds slightly positive. Extremities: no edema, no cyanosis    The results of significant diagnostics from this hospitalization (including imaging, microbiology, ancillary and laboratory) are listed below for reference.     Microbiology: No results found for this or any previous visit (from the past 240 hour(s)).   Labs: BNP (last 3 results) No results for input(s): BNP in the last 8760 hours. Basic Metabolic  Panel: Recent Labs  Lab 02/07/18 0627 02/08/18 0417  NA 137 140  K 3.6 4.7  CL 103 105  CO2 22 27  GLUCOSE 160* 125*  BUN 17 23  CREATININE 0.95 0.96  CALCIUM 9.5 8.6*  MG 2.4 2.5*   Liver Function Tests: Recent Labs  Lab 02/07/18 0627  AST 24  ALT 17  ALKPHOS 84  BILITOT 1.0  PROT 7.6  ALBUMIN 4.5   Recent Labs  Lab 02/07/18 0627  LIPASE 31   No results for input(s): AMMONIA in the last 168 hours. CBC: Recent Labs  Lab 02/07/18 0627 02/08/18 0417  WBC 13.0* 8.2  NEUTROABS 11.3*  --   HGB 16.7* 13.7  HCT 49.7* 42.8  MCV 90.4 97.1  PLT 303 230   Cardiac  Enzymes: No results for input(s): CKTOTAL, CKMB, CKMBINDEX, TROPONINI in the last 168 hours. BNP: Invalid input(s): POCBNP CBG: No results for input(s): GLUCAP in the last 168 hours. D-Dimer No results for input(s): DDIMER in the last 72 hours. Hgb A1c No results for input(s): HGBA1C in the last 72 hours. Lipid Profile No results for input(s): CHOL, HDL, LDLCALC, TRIG, CHOLHDL, LDLDIRECT in the last 72 hours. Thyroid function studies No results for input(s): TSH, T4TOTAL, T3FREE, THYROIDAB in the last 72 hours.  Invalid input(s): FREET3 Anemia work up No results for input(s): VITAMINB12, FOLATE, FERRITIN, TIBC, IRON, RETICCTPCT in the last 72 hours. Urinalysis    Component Value Date/Time   COLORURINE AMBER (A) 02/07/2018 0946   APPEARANCEUR CLEAR 02/07/2018 0946   LABSPEC 1.040 (H) 02/07/2018 0946   PHURINE 5.0 02/07/2018 0946   GLUCOSEU NEGATIVE 02/07/2018 0946   GLUCOSEU NEGATIVE 12/31/2009 1612   HGBUR NEGATIVE 02/07/2018 0946   BILIRUBINUR NEGATIVE 02/07/2018 0946   BILIRUBINUR small (A) 02/04/2017 1454   KETONESUR 20 (A) 02/07/2018 0946   PROTEINUR 30 (A) 02/07/2018 0946   UROBILINOGEN 0.2 02/04/2017 1454   UROBILINOGEN 1.0 06/03/2012 1610   NITRITE NEGATIVE 02/07/2018 0946   LEUKOCYTESUR NEGATIVE 02/07/2018 0946   Sepsis Labs Invalid input(s): PROCALCITONIN,  WBC,   LACTICIDVEN Microbiology No results found for this or any previous visit (from the past 240 hour(s)).   Time coordinating discharge: 35 minutes  SIGNED:   Aline August, MD  [facility] 02/08/2018, 11:29 AM Pager: [phone]  If 7PM-7AM, please contact night-coverage [email or web address] Password [email or web address]

## 2018-02-08 NOTE — Telephone Encounter (Signed)
Left message for pt to call, ? Amlodipine 5 mg twice daily, only once daily in chart.

## 2018-02-08 NOTE — Care Management Obs Status (Signed)
[facility] NOTIFICATION   Patient Details  Name: MEKIAH WAHLER MRN: [id number] Date of Birth: [birth year]   Medicare Observation Status Notification Given:  Yes    Guadalupe Maple, RN 02/08/2018, 12:44 PM

## 2018-02-11 ENCOUNTER — Telehealth: Payer: Self-pay | Admitting: Cardiovascular Disease

## 2018-02-11 NOTE — Telephone Encounter (Signed)
New message   Pt c/o medication issue:  1. Name of Medication: amLODipine (NORVASC) 5 MG tablet  2. How are you currently taking this medication (dosage and times per day)? 1 in morning 1 at night  3. Are you having a reaction (difficulty breathing--STAT)?no   4. What is your medication issue? Patient wants to have this medication increased. Please call to discuss.

## 2018-02-11 NOTE — Telephone Encounter (Signed)
Returned pt [email or web address] 

## 2018-02-12 DIAGNOSIS — R05 Cough: Secondary | ICD-10-CM | POA: Diagnosis not present

## 2018-02-16 NOTE — Telephone Encounter (Signed)
Called patient, LVM advising to call back to discuss why she wanted to increase her medication. Left call back number.

## 2018-02-21 ENCOUNTER — Telehealth: Payer: Self-pay | Admitting: *Deleted

## 2018-02-21 DIAGNOSIS — M1812 Unilateral primary osteoarthritis of first carpometacarpal joint, left hand: Secondary | ICD-10-CM | POA: Diagnosis not present

## 2018-02-21 DIAGNOSIS — G5602 Carpal tunnel syndrome, left upper limb: Secondary | ICD-10-CM | POA: Diagnosis not present

## 2018-02-21 DIAGNOSIS — M79642 Pain in left hand: Secondary | ICD-10-CM | POA: Diagnosis not present

## 2018-02-21 NOTE — Telephone Encounter (Signed)
Patient came to office today and filled out walk in paper forms, she is wanting samples of Tikosyn. This office does not have samples and unsure who actually prescribes medication. Left message to call back

## 2018-02-23 NOTE — Telephone Encounter (Signed)
Left message to call back  

## 2018-02-24 NOTE — Telephone Encounter (Signed)
Left message for pt to call.

## 2018-02-24 NOTE — Telephone Encounter (Signed)
Encounter closed

## 2018-02-24 NOTE — Telephone Encounter (Signed)
LEFT MESSAGE - NO SAMPLES AVAILABLE, PLEAS CALL BACK IF RX NEEDS TO CALLED TO PHARMACY BY THE END OF DAY IF NO RESPONSE WILL CLOSE ENCOUNTER

## 2018-02-25 ENCOUNTER — Encounter: Payer: Self-pay | Admitting: Cardiology

## 2018-03-03 ENCOUNTER — Telehealth: Payer: Self-pay

## 2018-03-03 NOTE — Telephone Encounter (Signed)
Left message for patient to remind of missed remote transmission.  

## 2018-03-04 ENCOUNTER — Ambulatory Visit (INDEPENDENT_AMBULATORY_CARE_PROVIDER_SITE_OTHER): Payer: Medicare HMO

## 2018-03-04 DIAGNOSIS — I495 Sick sinus syndrome: Secondary | ICD-10-CM

## 2018-03-05 LAB — CUP PACEART REMOTE DEVICE CHECK
Battery Impedance: 449 Ohm
Battery Remaining Longevity: 83 mo
Battery Voltage: 2.78 V
Brady Statistic AP VP Percent: 1 %
Brady Statistic AP VS Percent: 97 %
Brady Statistic AS VP Percent: 0 %
Brady Statistic AS VS Percent: 2 %
Date Time Interrogation Session: 20200124155118
Implantable Lead Implant Date: 20131212
Implantable Lead Implant Date: 20131212
Implantable Lead Location: 753859
Implantable Lead Location: 753860
Implantable Lead Model: 5076
Implantable Lead Model: 5076
Implantable Pulse Generator Implant Date: 20131212
Lead Channel Impedance Value: 383 Ohm
Lead Channel Impedance Value: 481 Ohm
Lead Channel Pacing Threshold Amplitude: 0.625 V
Lead Channel Pacing Threshold Amplitude: 0.75 V
Lead Channel Pacing Threshold Pulse Width: 0.4 ms
Lead Channel Pacing Threshold Pulse Width: 0.4 ms
Lead Channel Setting Pacing Amplitude: 2 V
Lead Channel Setting Pacing Amplitude: 2.5 V
Lead Channel Setting Pacing Pulse Width: 0.4 ms
Lead Channel Setting Sensing Sensitivity: 5.6 mV

## 2018-03-07 ENCOUNTER — Other Ambulatory Visit (HOSPITAL_COMMUNITY): Payer: Self-pay | Admitting: Cardiovascular Disease

## 2018-03-07 DIAGNOSIS — I739 Peripheral vascular disease, unspecified: Secondary | ICD-10-CM

## 2018-03-07 NOTE — Progress Notes (Signed)
Remote pacemaker transmission.   

## 2018-03-11 ENCOUNTER — Encounter: Payer: Self-pay | Admitting: Emergency Medicine

## 2018-03-11 ENCOUNTER — Other Ambulatory Visit: Payer: Self-pay

## 2018-03-11 ENCOUNTER — Ambulatory Visit (INDEPENDENT_AMBULATORY_CARE_PROVIDER_SITE_OTHER): Payer: Medicare HMO | Admitting: Emergency Medicine

## 2018-03-11 ENCOUNTER — Ambulatory Visit (INDEPENDENT_AMBULATORY_CARE_PROVIDER_SITE_OTHER): Payer: Medicare HMO

## 2018-03-11 VITALS — BP 121/74 | HR 85 | Temp 98.5°F | Resp 16 | Ht 63.5 in | Wt 148.8 lb

## 2018-03-11 DIAGNOSIS — R05 Cough: Secondary | ICD-10-CM

## 2018-03-11 DIAGNOSIS — R059 Cough, unspecified: Secondary | ICD-10-CM

## 2018-03-11 DIAGNOSIS — Z23 Encounter for immunization: Secondary | ICD-10-CM

## 2018-03-11 NOTE — Patient Instructions (Addendum)
     If you have lab work done today you will be contacted with your lab results within the next 2 weeks.  If you have not heard from us then please contact us. The fastest way to get your results is to register for My Chart.   IF you received an x-ray today, you will receive an invoice from [facility]. Please contact [facility] at [phone] with questions or concerns regarding your invoice.   IF you received labwork today, you will receive an invoice from [facility]. Please contact [facility] at [phone] with questions or concerns regarding your invoice.   Our billing staff will not be able to assist you with questions regarding bills from these companies.  You will be contacted with the lab results as soon as they are available. The fastest way to get your results is to activate your My Chart account. Instructions are located on the last page of this paperwork. If you have not heard from us regarding the results in 2 weeks, please contact this office.     Cough, Adult  A cough helps to clear your throat and lungs. A cough may last only 2-3 weeks (acute), or it may last longer than 8 weeks (chronic). Many different things can cause a cough. A cough may be a sign of an illness or another medical condition. Follow these instructions at home:  Pay attention to any changes in your cough.  Take medicines only as told by your doctor. ? If you were prescribed an antibiotic medicine, take it as told by your doctor. Do not stop taking it even if you start to feel better. ? Talk with your doctor before you try using a cough medicine.  Drink enough fluid to keep your pee (urine) clear or pale yellow.  If the air is dry, use a cold steam vaporizer or humidifier in your home.  Stay away from things that make you cough at work or at home.  If your cough is worse at night, try using extra pillows to raise your head up higher while you sleep.  Do not smoke, and try not to  be around smoke. If you need help quitting, ask your doctor.  Do not have caffeine.  Do not drink alcohol.  Rest as needed. Contact a doctor if:  You have new problems (symptoms).  You cough up yellow fluid (pus).  Your cough does not get better after 2-3 weeks, or your cough gets worse.  Medicine does not help your cough and you are not sleeping well.  You have pain that gets worse or pain that is not helped with medicine.  You have a fever.  You are losing weight and you do not know why.  You have night sweats. Get help right away if:  You cough up blood.  You have trouble breathing.  Your heartbeat is very fast. This information is not intended to replace advice given to you by your health care provider. Make sure you discuss any questions you have with your health care provider. Document Released: 10/09/2010 Document Revised: 07/04/2015 Document Reviewed: 04/04/2014 Elsevier Interactive Patient Education  2019 [employer]  

## 2018-03-11 NOTE — Progress Notes (Signed)
[name] 78 y.o.   Chief Complaint  Patient presents with  . Cough    x 2 months - per patient nonproductive and patient states she has a PCP Dr Pamella Pert    HISTORY OF PRESENT ILLNESS: This is a 78 y.o. female complaining of cough for 2 months.  Nonproductive without associated symptoms.  Comes and goes.  Denies fever or chills.  HPI   Prior to Admission medications   Medication Sig Start Date End Date Taking? Authorizing Provider  alendronate (FOSAMAX) 70 MG tablet Take 70 mg by mouth every Thursday.  03/24/16  Yes [provider]  amLODipine (NORVASC) 5 MG tablet Take 5 mg by mouth daily. 09/14/17  Yes [provider]  Ascorbic Acid (VITAMIN C PO) Take 1 tablet by mouth daily.   Yes [provider]  cholecalciferol (VITAMIN D) 1000 UNITS tablet Take 1,000 Units by mouth daily.    Yes [provider]  citalopram (CELEXA) 20 MG tablet Take 0.5 tablets (10 mg total) by mouth daily. Future refills from PCP 10/19/17  Yes Croitoru, Mihai, MD  Coenzyme Q10 (CO Q-10) 100 MG CAPS Take 100 mg by mouth at bedtime.    Yes [provider]  dofetilide (TIKOSYN) 250 MCG capsule Take 250 mcg by mouth 2 (two) times daily.   Yes [provider]  furosemide (LASIX) 20 MG tablet Take 20 mg by mouth daily as needed for fluid or edema.    Yes [provider]  levothyroxine (SYNTHROID, LEVOTHROID) 25 MCG tablet Take 25-50 mcg by mouth daily before breakfast. Take 1 tablet (25 mcg) by mouth Monday through Thursday, then take 2 tablets (50 mcg) on Friday, Saturday, & Sunday. 04/03/16  Yes [provider]  MAGNESIUM CITRATE PO Take 400 mg by mouth daily.   Yes [provider]  Multiple Vitamin (MULTIVITAMIN WITH MINERALS) TABS tablet Take 2 tablets by mouth daily.    Yes [provider]  nitroGLYCERIN (NITROSTAT) 0.4 MG SL tablet Place 1 tablet (0.4 mg total) under the tongue every 5 (five) minutes x 3 doses as needed for  chest pain. 05/15/15  Yes Simmons, Brittainy M, PA-C  Omega-3 Fatty Acids (SUPER OMEGA-3) 1000 MG CAPS Take 1,000 mg by mouth at bedtime.    Yes [provider]  OVER THE COUNTER MEDICATION    Yes [provider]  ondansetron (ZOFRAN ODT) 4 MG disintegrating tablet Take 1 tablet (4 mg total) by mouth every 8 (eight) hours as needed for nausea or vomiting. Patient not taking: Reported on 03/11/2018 02/08/18   Aline August, MD  polyethylene glycol [facility]) packet Take 17 g by mouth daily. Patient not taking: Reported on 03/11/2018 02/08/18   Aline August, MD  senna-docusate (SENOKOT-S) 8.6-50 MG tablet Take 1 tablet by mouth 2 (two) times daily. Patient not taking: Reported on 03/11/2018 02/08/18   Aline August, MD  traMADol (ULTRAM) 50 MG tablet Take 1 tablet (50 mg total) by mouth every 6 (six) hours as needed for moderate pain. Patient not taking: Reported on 03/11/2018 02/08/18 02/08/19  Aline August, MD    Allergies  Allergen Reactions  . Brilinta [Ticagrelor] Shortness Of Breath  . Clonidine Derivatives Other (See Comments)    Lowers heart rate  . Atorvastatin Other (See Comments)    Possible cause of fatigue/malaise  . Crestor [Rosuvastatin Calcium] Other (See Comments)    Joint/muscle aches  . Exforge [Amlodipine Besylate-Valsartan] Itching and Rash  . Spironolactone Other (See Comments)    Contraindicated  with history of hyperkalemia    Patient Active Problem List   Diagnosis Date Noted  . Hypothyroidism (acquired) 02/08/2018  . Large bowel obstruction ([place]) 02/07/2018  . Coronary artery disease involving coronary bypass graft of native heart without angina pectoris 10/22/2017  . Dyslipidemia 10/22/2017  . Cerebrovascular small vessel disease 04/27/2017  . Gait abnormality 03/25/2017  . Cerebrovascular disease 03/25/2017  . Claudication in peripheral vascular disease ([place]) 02/15/2017  . Encounter for monitoring dofetilide therapy 09/25/2016  .  Hyperparathyroidism ([place]) 05/13/2016  . Pacemaker 01/10/2016  . Heme positive stool   . Benign neoplasm of ascending colon   . Long term current use of anticoagulant   . Gastrointestinal hemorrhage 12/17/2015  . Acute blood loss anemia 12/17/2015  . CKD (chronic kidney disease), stage II   . Syncope 12/05/2015  . Hypotension 12/05/2015  . Hyperkalemia 12/04/2015  . H/O amiodarone therapy 12/04/2015  . Chronic diastolic heart failure ([place]) 10/19/2015  . Hypercalcemia 10/19/2015  . Atypical atrial flutter ([place])   . Paroxysmal atrial flutter ([place]) 08/23/2015  . Febrile illness, acute 06/27/2015  . Chronic fatigue 06/27/2015  . Stented coronary artery   . NSTEMI (non-ST elevated myocardial infarction) ([place]) 05/11/2015  . S/P CABG x 3 01/04/15 01/10/2015  . CAD (coronary artery disease) 01/04/2015  . Symptomatic sinus bradycardia 01/22/2012  . PAF (paroxysmal atrial fibrillation) ([place]) 01/22/2012  . Sinus node dysfunction ([place]) 01/22/2012  . S/P placement of cardiac pacemaker, medtronic adapta 01/21/12 01/22/2012  . PVD (peripheral vascular disease), hx stents to bil SFAs 02/2010 01/22/2012  . Hyperlipidemia 12/31/2009  . Anxiety 12/31/2009  . DEPRESSION 12/31/2009  . Essential hypertension 12/31/2009  . Coronary atherosclerosis 12/31/2009  . OSTEOARTHRITIS, HAND 12/31/2009  . [place] DISEASE, CERVICAL 12/31/2009  . [place] DISEASE, LUMBAR 12/31/2009  . UNSPECIFIED URINARY INCONTINENCE 12/31/2009  . Abdominal pain 12/31/2009    Past Medical History:  Diagnosis Date  . ANXIETY 12/31/2009  . Aortic insufficiency    a. mod by echo 2017.  [place] Kitchen CAD (coronary artery disease)    a. s/p CABGx3 and LAA clipping in 12/2014, NSTEMI 05/2015 s/p DES to native RCA and SVG-dRCA; occluded ramus-SVG was treated medically). c. neg nuc 10/2015 at [facility].  . Chronic diastolic CHF (congestive heart failure) ([place])   . Chronic fatigue   . CKD (chronic kidney disease), stage II   . DEPRESSION 12/31/2009  .  [place] DISEASE, CERVICAL 12/31/2009  . [place] DISEASE, LUMBAR 12/31/2009  . DIVERTICULITIS, HX OF 12/31/2009  . Essential hypertension   . Gait abnormality 03/25/2017  . GLUCOSE INTOLERANCE 12/31/2009  . Habitual alcohol use   . History of stroke    self reports " they say i have had some pin strokes"   . Hypercalcemia   . Hyperkalemia   . Hyperlipidemia 12/31/2009  . Hypothyroidism   . MENOPAUSE, EARLY 12/31/2009  . NSTEMI (non-ST elevated myocardial infarction) ([place]) 05/11/2015  . Orthostatic hypotension   . OSTEOARTHRITIS, HAND   . Pacemaker 01/21/2012   MDT Adapta dual chamber  . Paroxysmal atrial flutter ([place])   . Persistent atrial fibrillation   . PVD (peripheral vascular disease), hx stents to bil SFAs 02/2010   . Rash, skin     superficial raised red pencil point sized rash bilateral forearms; states " it started when i started the Plavix "   . Ruptured left breast implant   . Symptomatic sinus bradycardia 01/22/2012  . Syncope    a. 11/2015 ? due to medications.  . Syncope  reports on 05-10-17 " i passed out 2 months ago in the bathroom and broke some ribs"    . Tachy-brady syndrome ([place])    a. s/p MDT PPM 2013.  . Tricuspid regurgitation     Past Surgical History:  Procedure Laterality Date  . ABDOMINAL AORTOGRAM W/LOWER EXTREMITY Bilateral 02/15/2017   Procedure: ABDOMINAL AORTOGRAM W/LOWER EXTREMITY;  Surgeon: Lorretta Harp, MD;  Location: [place] [facility];  Service: Cardiovascular;  Laterality: Bilateral;  bilat  . Ablation of typical atrial flutter (CTI line)  09/05/2016   Dr Antonieta Pert at [facility]   . AUGMENTATION MAMMAPLASTY  1983  . BREAST IMPLANT REMOVAL Bilateral 01/14/2018   Procedure: REMOVAL BILATERAL BREAST IMPLANTS;  Surgeon: Irene Limbo, MD;  Location: [place];  Service: Plastics;  Laterality: Bilateral;  . CAPSULECTOMY Bilateral 01/14/2018   Procedure: CAPSULECTOMY;  Surgeon: Irene Limbo, MD;  Location: [place];  Service: Plastics;  Laterality: Bilateral;  . CARDIAC CATHETERIZATION N/A 01/01/2015   Procedure: Left Heart Cath and Coronary Angiography;  Surgeon: Jettie Booze, MD;  Location: [place] [facility];  Service: Cardiovascular;  Laterality: N/A;  . CARDIAC CATHETERIZATION N/A 05/12/2015   Procedure: Left Heart Cath and Coronary Angiography;  Surgeon: Troy Sine, MD;  Location: [place] [facility];  Service: Cardiovascular;  Laterality: N/A;  . CARDIAC CATHETERIZATION N/A 05/12/2015   Procedure: Coronary Stent Intervention;  Surgeon: Troy Sine, MD;  Location: [place] [facility];  Service: Cardiovascular;  Laterality: N/A;  . CARDIOVERSION N/A 02/01/2015   Procedure: CARDIOVERSION;  Surgeon: Lelon Perla, MD;  Location: [facility] ENDOSCOPY;  Service: Cardiovascular;  Laterality: N/A;  . CARDIOVERSION N/A 08/30/2015   Procedure: CARDIOVERSION;  Surgeon: Sanda Klein, MD;  Location: [place] ENDOSCOPY;  Service: Cardiovascular;  Laterality: N/A;  . CARPAL TUNNEL RELEASE  2008   "right hand/thumb; carpal tunnel repair; got rid of arthritis" (01/21/2012)  . CLIPPING OF ATRIAL APPENDAGE N/A 01/04/2015   Procedure: CLIPPING OF ATRIAL APPENDAGE;  Surgeon: Grace Isaac, MD;  Location: [place];  Service: Open Heart Surgery;  Laterality: N/A;  . COLONOSCOPY N/A 12/21/2015   Procedure: COLONOSCOPY;  Surgeon: Milus Banister, MD;  Location: [facility] ENDOSCOPY;  Service: Endoscopy;  Laterality: N/A;  . CORONARY ARTERY BYPASS GRAFT N/A 01/04/2015   Procedure: CORONARY ARTERY BYPASS GRAFTING (CABG) x 3 using left internal mammory artery and greater saphenous vein right leg harvested endoscopically.;  Surgeon: Grace Isaac, MD;  LIMA-LAD, SVG-RI, SVG-PDA  . ESOPHAGOGASTRODUODENOSCOPY (EGD) WITH PROPOFOL N/A 12/18/2015   Procedure: ESOPHAGOGASTRODUODENOSCOPY (EGD) WITH PROPOFOL;  Surgeon: Milus Banister, MD;  Location: [facility];  Service: Endoscopy;  Laterality: N/A;  . FACELIFT, LOWER 2/3  1995    "mini" (01/21/2012)  . GIVENS CAPSULE STUDY N/A 12/21/2015   Procedure: GIVENS CAPSULE STUDY;  Surgeon: Milus Banister, MD;  Location: [facility];  Service: Endoscopy;  Laterality: N/A;  . Lower Arterial Examination  10/28/2011   R. SFA stent mild-moderate mixed density plaque with elevated velocities consistent with 50% diameter reduction. L. SFA stent moderate mixed denisty plaque at mid to distal level consistent with 50-69% diameter reduction.  . LOWER EXTREMITY ANGIOGRAPHY N/A 02/15/2017   Procedure: LOWER EXTREMITY ANGIOGRAPHY;  Surgeon: Lorretta Harp, MD;  Location: [place] [facility];  Service: Cardiovascular;  Laterality: N/A;  . OOPHORECTOMY  ~1979  . PARATHYROIDECTOMY N/A 05/13/2017   Procedure: PARATHYROIDECTOMY;  Surgeon: Armandina Gemma, MD;  Location: [facility];  Service: General;  Laterality:  N/A;  . PARTIAL COLECTOMY  2010  . PERIPHERAL ARTERIAL STENT GRAFT  2012; 2012   "LLE; RLE" (01/21/2012)  . PERIPHERAL VASCULAR BALLOON ANGIOPLASTY Right 02/15/2017   Procedure: PERIPHERAL VASCULAR BALLOON ANGIOPLASTY;  Surgeon: Lorretta Harp, MD;  Location: [place] [facility];  Service: Cardiovascular;  Laterality: Right;  SFA    . PERMANENT PACEMAKER INSERTION N/A 01/21/2012   Medtronic Adapta L implanted by Dr Sallyanne Kuster for tachy/brady syndrome  . POSTERIOR CERVICAL LAMINECTOMY  1985  . TEE WITHOUT CARDIOVERSION N/A 01/04/2015   Procedure: TRANSESOPHAGEAL ECHOCARDIOGRAM (TEE);  Surgeon: Grace Isaac, MD;  Location: [place];  Service: Open Heart Surgery;  Laterality: N/A;  . TEE WITHOUT CARDIOVERSION N/A 02/01/2015   Procedure: TRANSESOPHAGEAL ECHOCARDIOGRAM (TEE);  Surgeon: Lelon Perla, MD;  Location: [facility] ENDOSCOPY;  Service: Cardiovascular;  Laterality: N/A;  . TEE WITHOUT CARDIOVERSION N/A 08/30/2015   Procedure: TRANSESOPHAGEAL ECHOCARDIOGRAM (TEE);  Surgeon: Sanda Klein, MD;  Location: [facility] ENDOSCOPY;  Service: Cardiovascular;  Laterality: N/A;  . [place] History   Socioeconomic History  . Marital status: Widowed    Spouse name: Not on file  . Number of children: 3  . Years of education: Not on file  . Highest education level: Not on file  Occupational History  . 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 Financial resource strain: Not on file  . Food insecurity:    Worry: Not on file    Inability: Not on file  . Transportation needs:    Medical: Not on file    Non-medical: Not on file  Tobacco Use  . Smoking status: Former Smoker    Packs/day: 0.75    Years: 10.00    Pack years: 7.50    Types: Cigarettes    Last attempt to quit: 2008    Years since quitting: 12.0  . Smokeless tobacco: Never Used  . Tobacco comment: 01/21/2012 "quit smoking ~ 2002"  Substance and Sexual Activity  . Alcohol use: Yes    Comment: daily coctail  . Drug use: No  . Sexual activity: Not Currently  Lifestyle  . Physical activity:    Days per week: Not on file    Minutes per session: Not on file  . Stress: Not on file  Relationships  . Social connections:    Talks on phone: Not on file    Gets together: Not on file    Attends religious service: Not on file    Active member of club or organization: Not on file    Attends meetings of clubs or organizations: Not on file    Relationship status: Not on file  . Intimate partner violence:    Fear of current or ex partner: Not on file    Emotionally abused: Not on file    Physically abused: Not on file    Forced sexual activity: Not on file  Other Topics Concern  . Not on file  Social History Narrative   Lives in [place].  Retired.    Family History  Problem Relation Age of Onset  . Heart disease Mother   . Hypertension Mother   . Stroke Mother   . Stroke Father   . Heart disease Father   . Heart disease Brother   . Hypertension Brother        2 brothers  . CVA Brother        2 brothers  .  Heart attack Brother        2 brothers      Review of Systems  Constitutional: Negative.  Negative for chills and fever.  HENT: Negative.  Negative for sore throat.   Eyes: Negative.   Respiratory: Positive for cough.   Cardiovascular: Negative.  Negative for chest pain and palpitations.  Gastrointestinal: Negative.  Negative for abdominal pain, diarrhea, nausea and vomiting.  Genitourinary: Negative.  Negative for dysuria and hematuria.  Musculoskeletal: Negative.  Negative for back pain, myalgias and neck pain.  Skin: Negative.  Negative for rash.  Neurological: Negative.  Negative for dizziness and headaches.  Endo/Heme/Allergies: Negative.   All other systems reviewed and are negative.   Vitals:   03/11/18 1110  BP: 121/74  Pulse: 85  Resp: 16  Temp: 98.5 F (36.9 C)  SpO2: 94%    Physical Exam Vitals signs reviewed.  Constitutional:      Appearance: Normal appearance.  HENT:     Head: Normocephalic.     Mouth/Throat:     Mouth: Mucous membranes are moist.     Pharynx: Oropharynx is clear.  Eyes:     Extraocular Movements: Extraocular movements intact.     Conjunctiva/sclera: Conjunctivae normal.     Pupils: Pupils are equal, round, and reactive to light.  Neck:     Musculoskeletal: Normal range of motion.  Cardiovascular:     Rate and Rhythm: Normal rate and regular rhythm.     Heart sounds: Normal heart sounds.  Pulmonary:     Effort: Pulmonary effort is normal.     Breath sounds: Normal breath sounds.  Musculoskeletal: Normal range of motion.  Skin:    General: Skin is warm and dry.     Capillary Refill: Capillary refill takes less than 2 seconds.  Neurological:     General: No focal deficit present.     Mental Status: She is alert and oriented to person, place, and time.   Dg Chest 2 View  Result Date: 03/11/2018 CLINICAL DATA:  Cough. EXAM: CHEST - 2 VIEW COMPARISON:  Radiographs of March 10, 2017. FINDINGS: The heart size and mediastinal contours are within normal limits. No  pneumothorax or pleural effusion is noted. Status post coronary bypass graft. Left-sided pacemaker is unchanged in position. Both lungs are clear. The visualized skeletal structures are unremarkable. IMPRESSION: No active cardiopulmonary disease. Electronically Signed   By: Marijo Conception, M.D.   On: 03/11/2018 11:41     A total of 25 minutes was spent in the room with the patient, greater than 50% of which was in counseling/coordination of care regarding differential diagnosis, treatment, prognosis, and need for follow-up if no better or worse.  ASSESSMENT & PLAN: [name] was seen today for cough.  Diagnoses and all orders for this visit:  Cough -     DG Chest 2 View; Future  Need for prophylactic vaccination and inoculation against influenza -     Flu vaccine HIGH DOSE PF    Patient Instructions       If you have lab work done today you will be contacted with your lab results within the next 2 weeks.  If you have not heard from [place] then please contact us. The fastest way to get your results is to register for My Chart.   IF you received an x-ray today, you will receive an invoice from [facility]. Please contact [facility] at [phone] with questions or concerns regarding your invoice.   IF you received  labwork today, you will receive an invoice from [facility]. Please contact [facility] at [phone] with questions or concerns regarding your invoice.   Our billing staff will not be able to assist you with questions regarding bills from these companies.  You will be contacted with the lab results as soon as they are available. The fastest way to get your results is to activate your My Chart account. Instructions are located on the last page of this paperwork. If you have not heard from [place] regarding the results in 2 weeks, please contact this office.     Cough, Adult  A cough helps to clear your throat and lungs. A cough may last only 2-3 weeks (acute), or  it may last longer than 8 weeks (chronic). Many different things can cause a cough. A cough may be a sign of an illness or another medical condition. Follow these instructions at home:  Pay attention to any changes in your cough.  Take medicines only as told by your doctor. ? If you were prescribed an antibiotic medicine, take it as told by your doctor. Do not stop taking it even if you start to feel better. ? Talk with your doctor before you try using a cough medicine.  Drink enough fluid to keep your pee (urine) clear or pale yellow.  If the air is dry, use a cold steam vaporizer or humidifier in your home.  Stay away from things that make you cough at work or at home.  If your cough is worse at night, try using extra pillows to raise your head up higher while you sleep.  Do not smoke, and try not to be around smoke. If you need help quitting, ask your doctor.  Do not have caffeine.  Do not drink alcohol.  Rest as needed. Contact a doctor if:  You have new problems (symptoms).  You cough up yellow fluid (pus).  Your cough does not get better after 2-3 weeks, or your cough gets worse.  Medicine does not help your cough and you are not sleeping well.  You have pain that gets worse or pain that is not helped with medicine.  You have a fever.  You are losing weight and you do not know why.  You have night sweats. Get help right away if:  You cough up blood.  You have trouble breathing.  Your heartbeat is very fast. This information is not intended to replace advice given to you by your health care provider. Make sure you discuss any questions you have with your health care provider. Document Released: 10/09/2010 Document Revised: 07/04/2015 Document Reviewed: 04/04/2014 Elsevier Interactive Patient Education  2019 Elsevier Inc.      Agustina Caroli, MD [facility]

## 2018-03-16 ENCOUNTER — Telehealth: Payer: Self-pay | Admitting: Cardiovascular Disease

## 2018-03-16 ENCOUNTER — Ambulatory Visit (HOSPITAL_COMMUNITY)
Admission: RE | Admit: 2018-03-16 | Payer: Medicare HMO | Source: Ambulatory Visit | Attending: Cardiovascular Disease | Admitting: Cardiovascular Disease

## 2018-03-16 NOTE — Telephone Encounter (Signed)
Called patient, LVM notifying that that refill should come from PCP doctor, and no samples at our office at this time.  Left call back number if questions.

## 2018-03-16 NOTE — Telephone Encounter (Signed)
New Message         Pt c/o medication issue:  1. Name of Medication: Citalopram 20  2. How are you currently taking this medication (dosage and times per day)?   3. Are you having a reaction (difficulty breathing--STAT)? No   4. What is your medication issue? Need to be rewritten to 1 a day.Old says 1/2 day [facility]    Patient calling the office for samples of medication:   1.  What medication and dosage are you requesting samples for?Tikosyn  2.  Are you currently out of this medication? Patient has never got this Rx from [place], but from [facility].       [facility]

## 2018-03-21 ENCOUNTER — Other Ambulatory Visit: Payer: Self-pay | Admitting: Emergency Medicine

## 2018-03-21 NOTE — Telephone Encounter (Signed)
Requested medication (s) are due for refill today -yes  Requested medication (s) are on the active medication list -yes  Future visit scheduled - no  Last refill: 10/19/17  Notes to clinic: Patient is requesting refill of medication prescribed by outside provider. Sent for PCP review   Requested Prescriptions  Pending Prescriptions Disp Refills   citalopram (CELEXA) 20 MG tablet [Pharmacy Med Name: CITALOPRAM HBR 20 MG TABLET] 45 tablet 1    Sig: TAKE 1/2 TABLET BY MOUTH DAILY     There is no refill protocol information for this order       Requested Prescriptions  Pending Prescriptions Disp Refills   citalopram (CELEXA) 20 MG tablet [Pharmacy Med Name: CITALOPRAM HBR 20 MG TABLET] 45 tablet 1    Sig: TAKE 1/2 TABLET BY MOUTH DAILY     There is no refill protocol information for this order

## 2018-03-22 NOTE — Telephone Encounter (Signed)
F/U Message          Patient is calling for a refill, however; she needs to speak to Dr. Lurline Del nurse first. Pls call and advise. Patient states she needs to explain something first.

## 2018-03-22 NOTE — Telephone Encounter (Signed)
Please advise on refills. Pt was last seen 03/01/18

## 2018-03-24 DIAGNOSIS — I10 Essential (primary) hypertension: Secondary | ICD-10-CM | POA: Diagnosis not present

## 2018-03-24 DIAGNOSIS — E559 Vitamin D deficiency, unspecified: Secondary | ICD-10-CM | POA: Diagnosis not present

## 2018-03-24 DIAGNOSIS — E038 Other specified hypothyroidism: Secondary | ICD-10-CM | POA: Diagnosis not present

## 2018-03-24 DIAGNOSIS — E213 Hyperparathyroidism, unspecified: Secondary | ICD-10-CM | POA: Diagnosis not present

## 2018-03-29 ENCOUNTER — Other Ambulatory Visit: Payer: Self-pay | Admitting: *Deleted

## 2018-03-29 MED ORDER — DOFETILIDE 250 MCG PO CAPS: 250.0000 ug | ORAL_CAPSULE | Freq: Two times a day (BID) | ORAL | 3 refills | Status: DC

## 2018-03-31 DIAGNOSIS — I10 Essential (primary) hypertension: Secondary | ICD-10-CM | POA: Diagnosis not present

## 2018-03-31 DIAGNOSIS — M8580 Other specified disorders of bone density and structure, unspecified site: Secondary | ICD-10-CM | POA: Diagnosis not present

## 2018-03-31 DIAGNOSIS — E213 Hyperparathyroidism, unspecified: Secondary | ICD-10-CM | POA: Diagnosis not present

## 2018-03-31 DIAGNOSIS — E038 Other specified hypothyroidism: Secondary | ICD-10-CM | POA: Diagnosis not present

## 2018-03-31 DIAGNOSIS — E559 Vitamin D deficiency, unspecified: Secondary | ICD-10-CM | POA: Diagnosis not present

## 2018-04-11 ENCOUNTER — Telehealth: Payer: Self-pay | Admitting: Cardiovascular Disease

## 2018-04-11 NOTE — Telephone Encounter (Signed)
Patient states she needs the medication Tikosyn sent to another place.  She would like a nurse to give her a call. She states she left a message last week about this.   She sent a message via my chart last month about this.

## 2018-04-11 NOTE — Telephone Encounter (Signed)
Called patient, LVM to call back regarding which pharmacy medication should go to.  Left call back number.

## 2018-04-13 NOTE — Telephone Encounter (Signed)
Left message for pt to call.

## 2018-04-14 NOTE — Telephone Encounter (Signed)
Patient was advised of this issue through mychart.

## 2018-04-15 ENCOUNTER — Telehealth: Payer: Self-pay | Admitting: Cardiovascular Disease

## 2018-04-15 DIAGNOSIS — Z9013 Acquired absence of bilateral breasts and nipples: Secondary | ICD-10-CM | POA: Diagnosis not present

## 2018-04-15 MED ORDER — AMLODIPINE BESYLATE 5 MG PO TABS: 5.0000 mg | ORAL_TABLET | Freq: Every day | ORAL | 0 refills | Status: DC

## 2018-04-15 NOTE — Telephone Encounter (Signed)
Called pharmacy to see what is happening with amlodipine pharmacist states that pt wants a 90 day refill I have sent rx #90 but pt is due for follow up. Tried to call back pt and phone went straight to VM left detailed message, please scheduled follow up appointment she is due.

## 2018-04-15 NOTE — Telephone Encounter (Signed)
New message   Pt c/o medication issue:  1. Name of Medication: amLODipine (NORVASC) 5 MG tablet  2. How are you currently taking this medication (dosage and times per day)? 2 times daily  3. Are you having a reaction (difficulty breathing--STAT)?no  4. What is your medication issue? Patient is out of this medication and the drugstore states that the medication can't be refilled at this time it is too early. Please advise. Patient states that she would like this to be filled at [facility] on [address].

## 2018-04-19 NOTE — Telephone Encounter (Signed)
Appointment schedule  

## 2018-04-26 DIAGNOSIS — H348312 Tributary (branch) retinal vein occlusion, right eye, stable: Secondary | ICD-10-CM | POA: Diagnosis not present

## 2018-04-26 DIAGNOSIS — H26492 Other secondary cataract, left eye: Secondary | ICD-10-CM | POA: Diagnosis not present

## 2018-04-26 DIAGNOSIS — Z961 Presence of intraocular lens: Secondary | ICD-10-CM | POA: Diagnosis not present

## 2018-04-26 DIAGNOSIS — H2511 Age-related nuclear cataract, right eye: Secondary | ICD-10-CM | POA: Diagnosis not present

## 2018-04-26 DIAGNOSIS — H04123 Dry eye syndrome of bilateral lacrimal glands: Secondary | ICD-10-CM | POA: Diagnosis not present

## 2018-04-26 DIAGNOSIS — H35373 Puckering of macula, bilateral: Secondary | ICD-10-CM | POA: Diagnosis not present

## 2018-04-27 ENCOUNTER — Telehealth: Payer: Self-pay | Admitting: Cardiovascular Disease

## 2018-04-27 NOTE — Telephone Encounter (Signed)
New Message   Supplement  Tumeric 1500mg  Pt wants to know if it is okay she takes this supplement Please call back

## 2018-04-27 NOTE — Telephone Encounter (Signed)
Please advise. Thank you

## 2018-04-27 NOTE — Telephone Encounter (Signed)
Called patient, LVM advising of message from PharmD.

## 2018-04-27 NOTE — Telephone Encounter (Signed)
No interaction expected between turmeric and current medication. Okay to take if needed.

## 2018-04-29 ENCOUNTER — Telehealth: Payer: Self-pay | Admitting: Cardiovascular Disease

## 2018-04-29 NOTE — Telephone Encounter (Signed)
New Message   Pt is calling because she says it is difficult to walk. She is having pain in the back of her legs. She would like to see Dr Sallyanne Kuster   Please call

## 2018-04-29 NOTE — Telephone Encounter (Signed)
Lm to call back ./cy 

## 2018-05-02 ENCOUNTER — Other Ambulatory Visit: Payer: Self-pay | Admitting: Cardiovascular Disease

## 2018-05-02 DIAGNOSIS — I739 Peripheral vascular disease, unspecified: Secondary | ICD-10-CM

## 2018-05-02 NOTE — Telephone Encounter (Signed)
Please schedule for ABI , full LE arterial study only if ABI abnormal.  Please make sure that she understands that even if we find a blockage, fixing it would be considered an elective procedure and would not be performed until the current coronavirus-related restrictions on hospital-based procedures are lifted. [name]

## 2018-05-02 NOTE — Telephone Encounter (Signed)
Spoke with the pt. Pt sts that she is frustrated with the communication with our office. She hasn't been receiving the voicemail message left by [facility] facilities. Verified the phone number listed for her was correct.  Adv her that Dr.Croitoru has responded to her mychart message this morning. Pt was not aware, Dr.C mtcahrt response reviewed with the pt. Pt sts that the discomfort in her legs is present all the time, but worsens when she walks. The right is worst than the left. She is agreeable that she needs a repeated LEA dopp. Adv her about the limitations of scheduling routine testing. She is concerned that she may have restenosis of her LE stenting.  Adv pt that she may have our office telephone on DND. During the phone call attempted to call the pt from another line, pt phone went straight to voicemail. Pt sts her name on her voicemail, left a test message. Pt sts that she had not received a message that she has a Advertising account executive.  Adv her that I will fwd ab update to Dr.C about proceeding with testing.  Pt rqst that Dr.Cs response be sent through mychart since our office messages are not coming through to her voicemail.

## 2018-05-02 NOTE — Telephone Encounter (Signed)
Lm to call back ./cy 

## 2018-05-02 NOTE — Telephone Encounter (Signed)
See my chart encounter.

## 2018-05-04 ENCOUNTER — Ambulatory Visit (HOSPITAL_COMMUNITY)
Admission: RE | Admit: 2018-05-04 | Discharge: 2018-05-04 | Disposition: A | Discharge status: Home / Self Care | Payer: Medicare HMO | Source: Ambulatory Visit | Attending: Internal Medicine | Admitting: Internal Medicine

## 2018-05-04 ENCOUNTER — Telehealth: Payer: Self-pay | Admitting: *Deleted

## 2018-05-04 ENCOUNTER — Other Ambulatory Visit: Payer: Self-pay

## 2018-05-04 DIAGNOSIS — I739 Peripheral vascular disease, unspecified: Secondary | ICD-10-CM | POA: Insufficient documentation

## 2018-05-04 NOTE — Telephone Encounter (Addendum)
Left message for pt to call   ----- Message from Lorretta Harp, MD sent at 05/04/2018 12:20 PM EDT ----- She appears to have progression of disease within her right SFA stent.  Follow-up with me in 3 months

## 2018-05-06 NOTE — Telephone Encounter (Signed)
[facility]  VAS [place] LOWER EXTREMITY ARTERIAL DUPLEX RIGHT  Order# [id number]  Reading physician: Minna Merritts, MD Ordering physician: Lorretta Harp, MD Study date: 05/04/18  VAS [place] LOWER EXTREMITY ARTERIAL DUPLEX (Accession [id number]) (Order [id number])  Vascular Ultrasound  Date: 05/04/2018 Department: [facility] [address] Released By: Royston Bake Authorizing: Lorretta Harp, MD  Linked Results   Procedure Abnormality Status  VAS [place] LOWER EXTREMITY ARTERIAL DUPLEX    Exam Status   Status  Preliminary [70]  Reason for Exam  Priority: Routine  PAD  Dx: Peripheral arterial disease ([place]) [I73.9 (ICD-10-CM)]  Comments:   Exam Information   Status Exam Begun  Exam Ended   Preliminary [70] 05/04/2018 9:53 AM 05/04/2018 11:13 AM  Result Notes for VAS [place] LOWER EXTREMITY ARTERIAL DUPLEX   Notes recorded by Cristopher Estimable, RN on 05/04/2018 at 2:56 PM EDT Left message for pt to call ------  Notes recorded by Lorretta Harp, MD on 05/04/2018 at 12:20 PM EDT She appears to have progression of disease within her right SFA stent. Follow-up with me in 3 months      05/04/2018 11:13 AM - Interface, Three One Seven   Narrative & Impression   LOWER EXTREMITY ARTERIAL DUPLEX STUDY  Indications: Claudication, and Patient presents today with complaints of right              lower extremity discomfort and cramping when walking. Says she can              make it to mailbox and back before feeling like she has to stop and              rest. She recently had PTA and atherectomy of the proximal right              SFA in 1/19.  High Risk Factors: Hypertension, hyperlipidemia, past history of smoking,                    coronary artery disease.    Vascular Interventions: S/P PTA and atherectomy of the right superficial femoral                         artery on 02/15/17. History of bilateral SFA stents in                         2012. Current ABI:             Today's ABIs are 0.82 on the right and 1.03 on the left.  Comparison Study: Previous arterial duplex in 2/19 showed a widely patent SFA                   stent and atherectomy site without evidence of focal stenosis.  Performing Technologist: Mariane Masters RVT    Examination Guidelines: A complete evaluation includes B-mode imaging, spectral Doppler, color Doppler, and power Doppler as needed of all accessible portions of each vessel. Bilateral testing is considered an integral part of a complete examination. Limited examinations for reoccurring indications may be performed as noted.    Right Duplex Findings: +-----------+--------+-----+--------+----------+--------+            PSV cm/sRatioStenosisWaveform  Comments +-----------+--------+-----+--------+----------+--------+ CFA Prox   77                   triphasic          +-----------+--------+-----+--------+----------+--------+  CFA Distal                                plaque   +-----------+--------+-----+--------+----------+--------+ DFA        89                   triphasic plaque   +-----------+--------+-----+--------+----------+--------+ SFA Prox   37                   triphasic plaque   +-----------+--------+-----+--------+----------+--------+ SFA Mid    61                   triphasic plaque   +-----------+--------+-----+--------+----------+--------+ POP Prox   30                   biphasic           +-----------+--------+-----+--------+----------+--------+ POP Distal 17                   biphasic           +-----------+--------+-----+--------+----------+--------+ TP Trunk   29                   monophasicplaque   +-----------+--------+-----+--------+----------+--------+ ATA Distal 14                   biphasic           +-----------+--------+-----+--------+----------+--------+ PTA Distal 22                   biphasic            +-----------+--------+-----+--------+----------+--------+ PERO Distal12                   biphasic           +-----------+--------+-----+--------+----------+--------+    Right Stent(s): +---------------+--------+--------------+----------+---------------------------+ mid-distal SFA PSV cm/sStenosis      Waveform  Comments                    +---------------+--------+--------------+----------+---------------------------+ Prox to Stent  27                    biphasic                              +---------------+--------+--------------+----------+---------------------------+ Proximal Stent 21                    monophasic                            +---------------+--------+--------------+----------+---------------------------+ Mid Stent      316     50-99%        monophasicplaque, narrowing                                  stenosis                                            +---------------+--------+--------------+----------+---------------------------+ Distal Stent   84                    monophasicturbulence, waveform change +---------------+--------+--------------+----------+---------------------------+ Distal to Stent21  monophasicwaveform change             +---------------+--------+--------------+----------+---------------------------+ The VR is 13.7    Findings reported to Fredia Beets, RN at 10:45 am.   Summary: Right: 50-99% stenosis is noted within the mid-distal superficial femoral artery stent.    See table(s) above for measurements and observations.   Vascular consult recommended.          Preliminary    Lab and Collection   VAS [place] LOWER EXTREMITY ARTERIAL DUPLEX - 05/04/2018  Result History   VAS [place] LOWER EXTREMITY ARTERIAL DUPLEX on 05/04/2018  Performing Technologist/Nurse   Performing Technologist/Nurse: Dalene Carrow, RVT  Syngo Images   Show images for VAS [place] LOWER  EXTREMITY ARTERIAL DUPLEX  Encounter-Level Documents - 05/04/2018:   Electronic signature on 05/04/2018 9:48 AM - E-signed  Electronic signature on 05/04/2018 9:48 AM - Princess Perna      Order-Level Documents:   There are no order-level documents.  Hospital account-Level Documents:   There are no hospital account-level documents.  Vitals   Height Weight BMI (Calculated)      Imaging   Imaging Information  Surgical History   Surgical History   Procedure Laterality Date Comment Source  CARDIAC CATHETERIZATION N/A 01/01/2015 Procedure: Left Heart Cath and Coronary Angiography; Surgeon: Jettie Booze, MD; Location: [facility]; Service: Cardiovascular; Laterality: N/A; Provider  CARDIAC CATHETERIZATION N/A 05/12/2015 Procedure: Left Heart Cath and Coronary Angiography; Surgeon: Troy Sine, MD; Location: [facility]; Service: Cardiovascular; Laterality: N/A; Provider  CARDIAC CATHETERIZATION N/A 05/12/2015 Procedure: Coronary Stent Intervention; Surgeon: Troy Sine, MD; Location: [place] [facility]; Service: Cardiovascular; Laterality: N/A; Provider  CORONARY ARTERY BYPASS GRAFT N/A 01/04/2015 Procedure: CORONARY ARTERY BYPASS GRAFTING (CABG) x 3 using left internal mammory artery and greater saphenous vein right leg harvested endoscopically.; Surgeon: Grace Isaac, MD; LIMA-LAD, SVG-RI, SVG-PDA Provider    Other Surgical History   Procedure Laterality Date Comment Source  ABDOMINAL AORTOGRAM W/LOWER EXTREMITY Bilateral 02/15/2017 Procedure: ABDOMINAL AORTOGRAM W/LOWER EXTREMITY; Surgeon: Lorretta Harp, MD; Location: [facility]; Service: Cardiovascular; Laterality: Bilateral; bilat Provider  Ablation of typical atrial flutter (CTI line)  09/05/2016 Dr Antonieta Pert at [facility]  Provider  [place]  Provider  BREAST IMPLANT REMOVAL Bilateral 01/14/2018 Procedure: REMOVAL BILATERAL BREAST IMPLANTS; Surgeon: Irene Limbo, MD; Location: [place]; Service: Plastics; Laterality: Bilateral; Provider  CAPSULECTOMY Bilateral 01/14/2018 Procedure: CAPSULECTOMY; Surgeon: Irene Limbo, MD; Location: [place]; Service: Plastics; Laterality: Bilateral; Provider  CARDIOVERSION N/A 02/01/2015 Procedure: CARDIOVERSION; Surgeon: Lelon Perla, MD; Location: [facility] ENDOSCOPY; Service: Cardiovascular; Laterality: N/A; Provider  CARDIOVERSION N/A 08/30/2015 Procedure: CARDIOVERSION; Surgeon: Sanda Klein, MD; Location: [facility] ENDOSCOPY; Service: Cardiovascular; Laterality: N/A; Provider  CARPAL TUNNEL RELEASE  2008 "right hand/thumb; carpal tunnel repair; got rid of arthritis" (01/21/2012) Provider  CLIPPING OF ATRIAL APPENDAGE N/A 01/04/2015 Procedure: CLIPPING OF ATRIAL APPENDAGE; Surgeon: Grace Isaac, MD; Location: [place]; Service: Open Heart Surgery; Laterality: N/A; Provider  COLONOSCOPY N/A 12/21/2015 Procedure: COLONOSCOPY; Surgeon: Milus Banister, MD; Location: [facility]OSCOPY; Service: Endoscopy; Laterality: N/A; Provider  ESOPHAGOGASTRODUODENOSCOPY (EGD) WITH PROPOFOL N/A 12/18/2015 Procedure: ESOPHAGOGASTRODUODENOSCOPY (EGD) WITH PROPOFOL; Surgeon: Milus Banister, MD; Location: [facility]; Service: Endoscopy; Laterality: N/A; Provider  FACELIFT, LOWER 2/3  1995 "mini" (01/21/2012) Provider  GIVENS CAPSULE STUDY N/A 12/21/2015 Procedure: GIVENS CAPSULE STUDY; Surgeon: Milus Banister, MD; Location: [facility]; Service: Endoscopy; Laterality: N/A; Provider  Lower Arterial Examination  10/28/2011 R. SFA stent  mild-moderate mixed density plaque with elevated velocities consistent with 50% diameter reduction. L. SFA stent moderate mixed denisty plaque at mid to distal level consistent with 50-69% diameter reduction. Provider  LOWER EXTREMITY ANGIOGRAPHY N/A 02/15/2017 Procedure: LOWER EXTREMITY ANGIOGRAPHY; Surgeon: Lorretta Harp, MD; Location: [place] [facility]; Service: Cardiovascular; Laterality: N/A; Provider  OOPHORECTOMY  [phone]  Provider  PARATHYROIDECTOMY N/A 05/13/2017 Procedure: PARATHYROIDECTOMY; Surgeon: Armandina Gemma, MD; Location: [facility]; Service: General; Laterality: N/A; Provider  PARTIAL COLECTOMY  2010  Provider  PERIPHERAL ARTERIAL STENT GRAFT  2012; 2012 "LLE; RLE" (01/21/2012) Provider  PERIPHERAL VASCULAR BALLOON ANGIOPLASTY Right 02/15/2017 Procedure: PERIPHERAL VASCULAR BALLOON ANGIOPLASTY; Surgeon: Lorretta Harp, MD; Location: [place] [facility]; Service: Cardiovascular; Laterality: Right; SFA   Provider  PERMANENT PACEMAKER INSERTION N/A 01/21/2012 Medtronic Adapta L implanted by Dr Sallyanne Kuster for tachy/brady syndrome Provider  [place]  Provider  TEE WITHOUT CARDIOVERSION N/A 01/04/2015 Procedure: TRANSESOPHAGEAL ECHOCARDIOGRAM (TEE); Surgeon: Grace Isaac, MD; Location: [place]; Service: Open Heart Surgery; Laterality: N/A; Provider  TEE WITHOUT CARDIOVERSION N/A 02/01/2015 Procedure: TRANSESOPHAGEAL ECHOCARDIOGRAM (TEE); Surgeon: Lelon Perla, MD; Location: [facility] ENDOSCOPY; Service: Cardiovascular; Laterality: N/A; Provider  TEE WITHOUT CARDIOVERSION N/A 08/30/2015 Procedure: TRANSESOPHAGEAL ECHOCARDIOGRAM (TEE); Surgeon: Sanda Klein, MD; Location: [facility] ENDOSCOPY; Service: Cardiovascular; Laterality: N/A; Provider  VAGINAL HYSTERECTOMY  1975  Provider    Medical History   Diagnosis Date Comment Source  ANXIETY 12/31/2009  Provider  Aortic insufficiency  a. mod by echo 2017. Provider  CAD (coronary artery disease)  a. s/p CABGx3 and LAA clipping in 12/2014, NSTEMI 05/2015 s/p DES to native RCA and SVG-dRCA; occluded ramus-SVG was treated medically). c. neg nuc 10/2015 at [facility]. Provider  Chronic diastolic CHF (congestive heart failure) [facility])   Provider  Chronic fatigue   Provider  CKD (chronic kidney disease), stage II   Provider  DEPRESSION 12/31/2009  Provider   [place] DISEASE, CERVICAL 12/31/2009  Provider  [place] DISEASE, LUMBAR 12/31/2009  Provider  DIVERTICULITIS, HX OF 12/31/2009  Provider  Essential hypertension   Provider  Gait abnormality 03/25/2017  Provider  GLUCOSE INTOLERANCE 12/31/2009  Provider  Habitual alcohol use   Provider  History of stroke  self reports " they say i have had some pin strokes"  Provider  Hypercalcemia   Provider  Hyperkalemia   Provider  Hyperlipidemia 12/31/2009  Provider  Hypothyroidism   Provider  MENOPAUSE, EARLY 12/31/2009  Provider  NSTEMI (non-ST elevated myocardial infarction) ([place]) 05/11/2015  Provider  Orthostatic hypotension   Provider  OSTEOARTHRITIS, HAND   Provider  Pacemaker 01/21/2012 MDT Adapta dual chamber Provider  Paroxysmal atrial flutter [facility])   Provider  Persistent atrial fibrillation   Provider  PVD (peripheral vascular disease), hx stents to bil SFAs 02/2010   Provider  Rash, skin  superficial raised red pencil point sized rash bilateral forearms; states " it started when i started the Plavix "  Provider  Ruptured left breast implant   Provider  Symptomatic sinus bradycardia 01/22/2012  Provider  Syncope  a. 11/2015 ? due to medications. Provider  Syncope  reports on 05-10-17 " i passed out 2 months ago in the bathroom and broke some ribs"  Provider  Tachy-brady syndrome ([place])  a. s/p MDT PPM 2013. Provider  Tricuspid regurgitation   Provider  Implants    Permanent Stent  Stent Synergy Des 2.75x38 - [id number] - Implanted    Inventory item: [place] 6.19J09 Model/Cat number: [id number]  Manufacturer: [place] Lot number: [id number]  Device identifier: [id number] Device identifier type: GS1  As of 05/12/2015    Status: Implanted      Stent Synergy Des 3x20 - [id number] - Implanted    Inventory item: Melissa Noon DES 3X20 Model/Cat number: [id number]  Manufacturer: [employer] Lot number: [id number]  Device identifier: [id number]  Device identifier type: GS1  As of 05/12/2015    Status: Implanted      Implant  [place]erm Addrl1 Adapta [id number] - Implanted    Model/Cat number: ADDRL1 ADAPTA Serial number: [id number]  Manufacturer: [employer]    As of 03/04/2018    Status: Implanted      [place]erm 5076 Capsurefix Novus [id number] - Implanted    Model/Cat number: 5076 CAPSUREFIX NOVUS Serial number: [id number]  Manufacturer: [employer]    As of 11/29/2014    Status: Implanted      Merm 5076 Capsurefix Novus [id number] - Implanted    Model/Cat number: 5076 CAPSUREFIX NOVUS Serial number: [id number]  Manufacturer: [employer]    As of 11/29/2014    Status: Implanted      Original Order   Ordered On Ordered By   03/12/2017 4:39 PM Therisa Doyne           External Result Report   External Result Report

## 2018-05-06 NOTE — Telephone Encounter (Signed)
Pt aware of results and appt made for 07/12/18 at 10:00 am

## 2018-05-16 ENCOUNTER — Telehealth: Payer: Self-pay

## 2018-05-16 NOTE — Telephone Encounter (Signed)
Virtual Visit Pre-Appointment Phone Call   [name] has been deemed a candidate for a follow-up tele-health visit to limit community exposure during the Covid-19 pandemic. I spoke with the patient via phone to ensure availability of phone/video source, confirm preferred email & phone number, and discuss instructions and expectations.  I reminded [name] to be prepared with any vital sign and/or heart rhythm information that could potentially be obtained via home monitoring, at the time of her visit. I reminded [name] to expect a phone call at the time of her visit if her visit.  Did the patient verbally acknowledge consent to treatment? Patient provided verbal consent.  Bronson Bressman, [place], [place] 05/16/2018 11:00 AM   DOWNLOADING THE WEBEX SOFTWARE TO SMARTPHONE  - If Apple, go to [employer] and type in WebEx in the search bar. [place] [employer], the blue/green circle. The app is free but as with any other app downloads, their phone may require them to verify saved payment information or Apple password. The patient does NOT have to create an account.  - If Android, ask patient to go to [employer] and type in WebEx in the search bar. [place] [employer], the blue/green circle. The app is free but as with any other app downloads, their phone may require them to verify saved payment information or Android password. The patient does NOT have to create an account.   CONSENT FOR TELE-HEALTH VISIT - PLEASE REVIEW  I hereby voluntarily request, consent and authorize [facility] and its employed or contracted physicians, physician assistants, nurse practitioners or other licensed health care professionals (the Practitioner), to provide me with telemedicine health care services (the "Services") as deemed necessary by the treating Practitioner. I acknowledge and consent to receive the Services by the Practitioner via telemedicine. I understand that the  telemedicine visit will involve communicating with the Practitioner through live audiovisual communication technology and the disclosure of certain medical information by electronic transmission. I acknowledge that I have been given the opportunity to request an in-person assessment or other available alternative prior to the telemedicine visit and am voluntarily participating in the telemedicine visit.  I understand that I have the right to withhold or withdraw my consent to the use of telemedicine in the course of my care at any time, without affecting my right to future care or treatment, and that the Practitioner or I may terminate the telemedicine visit at any time. I understand that I have the right to inspect all information obtained and/or recorded in the course of the telemedicine visit and may receive copies of available information for a reasonable fee.  I understand that some of the potential risks of receiving the Services via telemedicine include:  Marland Kitchen Delay or interruption in medical evaluation due to technological equipment failure or disruption; . Information transmitted may not be sufficient (e.g. poor resolution of images) to allow for appropriate medical decision making by the Practitioner; and/or  . In rare instances, security protocols could fail, causing a breach of personal health information.  Furthermore, I acknowledge that it is my responsibility to provide information about my medical history, conditions and care that is complete and accurate to the best of my ability. I acknowledge that Practitioner's advice, recommendations, and/or decision may be based on factors not within their control, such as incomplete or inaccurate data provided by me or distortions of diagnostic images or specimens that may result from electronic transmissions. I understand that the  practice of medicine is not an Chief Strategy Officer and that Practitioner makes no warranties or guarantees regarding treatment  outcomes. I acknowledge that I will receive a copy of this consent concurrently upon execution via email to the email address I last provided but may also request a printed copy by calling the office of [place].    I understand that my insurance will be billed for this visit.   I have read or had this consent read to me. . I understand the contents of this consent, which adequately explains the benefits and risks of the Services being provided via telemedicine.  . I have been provided ample opportunity to ask questions regarding this consent and the Services and have had my questions answered to my satisfaction. . I give my informed consent for the services to be provided through the use of telemedicine in my medical care  By participating in this telemedicine visit I agree to the above.

## 2018-05-18 ENCOUNTER — Telehealth: Payer: Medicare HMO | Admitting: Cardiovascular Disease

## 2018-05-23 NOTE — Telephone Encounter (Signed)
Ov 07/12/2018.

## 2018-05-25 ENCOUNTER — Telehealth: Payer: Self-pay | Admitting: *Deleted

## 2018-05-25 ENCOUNTER — Telehealth: Payer: Self-pay

## 2018-05-25 NOTE — Telephone Encounter (Signed)
Left message for patient to call and schedule VIRTUAL visit with Dr. Sallyanne Kuster on 05/26/18 per staff message from Elly Modena, LPN

## 2018-05-25 NOTE — Telephone Encounter (Signed)
Called patient 3 times no answer.Left message on personal voice mail Dr.Croitoru tried calling you several times for phone visit on 05/18/18.I rescheduled your phone visit with Dr.Croitoru tomorrow 4/16 at 2:00 pm.Advised to call back to confirm your phone number.

## 2018-05-26 ENCOUNTER — Telehealth: Payer: Self-pay

## 2018-05-26 ENCOUNTER — Telehealth (INDEPENDENT_AMBULATORY_CARE_PROVIDER_SITE_OTHER): Payer: Medicare HMO | Admitting: Cardiovascular Disease

## 2018-05-26 VITALS — BP 130/84 | HR 85 | Ht 64.05 in | Wt 147.0 lb

## 2018-05-26 DIAGNOSIS — I495 Sick sinus syndrome: Secondary | ICD-10-CM | POA: Diagnosis not present

## 2018-05-26 DIAGNOSIS — I5032 Chronic diastolic (congestive) heart failure: Secondary | ICD-10-CM

## 2018-05-26 DIAGNOSIS — I739 Peripheral vascular disease, unspecified: Secondary | ICD-10-CM

## 2018-05-26 DIAGNOSIS — Z950 Presence of cardiac pacemaker: Secondary | ICD-10-CM | POA: Diagnosis not present

## 2018-05-26 DIAGNOSIS — N182 Chronic kidney disease, stage 2 (mild): Secondary | ICD-10-CM

## 2018-05-26 DIAGNOSIS — I480 Paroxysmal atrial fibrillation: Secondary | ICD-10-CM | POA: Diagnosis not present

## 2018-05-26 DIAGNOSIS — I10 Essential (primary) hypertension: Secondary | ICD-10-CM | POA: Diagnosis not present

## 2018-05-26 DIAGNOSIS — E785 Hyperlipidemia, unspecified: Secondary | ICD-10-CM | POA: Diagnosis not present

## 2018-05-26 DIAGNOSIS — I25810 Atherosclerosis of coronary artery bypass graft(s) without angina pectoris: Secondary | ICD-10-CM | POA: Diagnosis not present

## 2018-05-26 NOTE — Patient Instructions (Addendum)
Try to drink more fluids and wear compression stockings if upright for longer periods. Needs ECG and BMET+ Mg in about 3 months. Dr. Gwenlyn Found also requested her to have an appt with him in 3 months and I would like to have that all happen at the same visit. She understands "3 months" may be August.   Scheduler will be calling you to schedule appointment with Dr.Berry She should go ahead with the scheduled pacemaker download April 24. I will see her in 6 months, please    Call in July to schedule October appointment with Dr.Kiyoshi Schaab.

## 2018-05-26 NOTE — Telephone Encounter (Signed)
OK. But I was not using Doximity to call her, but rather regular phone. She must have some form of call blocking on. [name]

## 2018-05-26 NOTE — Telephone Encounter (Signed)
Spoke to patient at # [phone].When I called her using Doximity app phone would go straight to voice mail.I had to call her from my cell phone.She stated she has been having this problem from others in our office.Advised Dr.Croitoru tried to call her several times on 4/8.Advised she has a tele visit with Dr.Croitoru this afternoon at 2:00 pm.Advised I will let Dr.Croitoru know he cannot use Doximity app.

## 2018-05-26 NOTE — Progress Notes (Signed)
Virtual Visit via Telephone Note   This visit type was conducted due to national recommendations for restrictions regarding the COVID-19 Pandemic (e.g. social distancing) in an effort to limit this patient's exposure and mitigate transmission in our community.  Due to her co-morbid illnesses, this patient is at least at moderate risk for complications without adequate follow up.  This format is felt to be most appropriate for this patient at this time.  The patient did not have access to video technology/had technical difficulties with video requiring transitioning to audio format only (telephone).  All issues noted in this document were discussed and addressed.  No physical exam could be performed with this format.  Please refer to the patient's chart for her  consent to telehealth for [facility]   Evaluation Performed:  Follow-up visit  Date:  05/26/2018   ID:  [name], DOB 08/19/1940, MRN [id number]  Patient Location: Home Provider Location: Other:  [facility]  PCP:  Horald Pollen, MD  Cardiologist:  Sanda Klein, MD  Electrophysiologist:  None   Chief Complaint:  Intermittent claudication  History of Present Illness:    [name] is a 78 y.o. female with CAD s/p CABG 2016 and PCI-DES to RCA and SVG-RCA 2017, PAD s/p R SFA atherectomy/stent Gwenlyn Found, 02/2017), paroxysmal atrial fibrillation (s/p LA appendage clipping during CABG) on dofetilide, dual chamber pacemaker for tachy-brady sd. (Medtronic, 2013)hyperlipidemia.  She describes typical intermittent claudication (pain, cramping, paresthesia) with a threshold of roughly 100 yards over the last several weeks.  The symptoms had improved following her right SFA atherectomy-stent in 2019, but subsequently recurred.  She had lower extremity duplex ultrasonography performed a few days ago and this showed an ABI of 0.82 on the right side with a normal left-sided ABI.  She does not have any symptoms of limb pain or  paresthesias at rest.  She continues to complain of occasional episodes of lightheadedness that compel her to sit down or lie down.  These happen randomly during the day but only when she is upright.  1 of these happened earlier today.  She sat down and checked her blood pressure which was 156/92.  A couple of minutes later her blood pressure was down to 124/75.  She has not had any palpitations that she denies full-blown syncope.  She denies dyspnea on exertion, orthopnea or PND.  She has not had leg edema and has not taken any furosemide recently.  She denies any other intercurrent illnesses.  Pacemaker function is normal.  Her device has not detected atrial fibrillation in the last year although she has occasional very brief episodes of atrial mode switch due to atrial tachycardia.  She has almost 100% atrial pacing, but almost never requires ventricular pacing (1%).  She had explantation of a burst breast implant in Decmber 2019. Labs were normal at that time.  The patient does not have symptoms concerning for COVID-19 infection (fever, chills, cough, or new shortness of breath).   She has normal left ventricular systolic function. She does have evidence of grade 2 diastolic dysfunction on previous echo. At the time of her bypass surgery she received an Atricure left atrial appendage clip.  The pacemaker was implanted in 2013 for symptomatic sinus bradycardia. She also has a history of peripheral arterial disease and received bilateral superficial femoral artery stents, most recently R SFA stent Jan 2019.   Developed hyperkalemia with ACEi.  After undergoing bypass surgery in November 2016 she returned with non-ST segment  elevation myocardial infarction in April 2017 and required placement of stents both in the native right coronary artery (3.020 mm Synergy DES) and in the saphenous vein graft to the distal right coronary artery (2.7538 mm Synergy DES) due to early graft dysfunction. The  saphenous vein graft to the ramus intermedius was also occluded, but this vessel was left for medical therapy.  S/P parathyroidectomy 2019, for hypercalcemia      Past Medical History:  Diagnosis Date  . ANXIETY 12/31/2009  . Aortic insufficiency    a. mod by echo 2017.  Marland Kitchen CAD (coronary artery disease)    a. s/p CABGx3 and LAA clipping in 12/2014, NSTEMI 05/2015 s/p DES to native RCA and SVG-dRCA; occluded ramus-SVG was treated medically). c. neg nuc 10/2015 at [facility].  . Chronic diastolic CHF (congestive heart failure) ([place])   . Chronic fatigue   . CKD (chronic kidney disease), stage II   . DEPRESSION 12/31/2009  . [place] DISEASE, CERVICAL 12/31/2009  . [place] DISEASE, LUMBAR 12/31/2009  . DIVERTICULITIS, HX OF 12/31/2009  . Essential hypertension   . Gait abnormality 03/25/2017  . GLUCOSE INTOLERANCE 12/31/2009  . Habitual alcohol use   . History of stroke    self reports " they say i have had some pin strokes"   . Hypercalcemia   . Hyperkalemia   . Hyperlipidemia 12/31/2009  . Hypothyroidism   . MENOPAUSE, EARLY 12/31/2009  . NSTEMI (non-ST elevated myocardial infarction) ([place]) 05/11/2015  . Orthostatic hypotension   . OSTEOARTHRITIS, HAND   . Pacemaker 01/21/2012   MDT Adapta dual chamber  . Paroxysmal atrial flutter ([place])   . Persistent atrial fibrillation   . PVD (peripheral vascular disease), hx stents to bil SFAs 02/2010   . Rash, skin     superficial raised red pencil point sized rash bilateral forearms; states " it started when i started the Plavix "   . Ruptured left breast implant   . Symptomatic sinus bradycardia 01/22/2012  . Syncope    a. 11/2015 ? due to medications.  . Syncope    reports on 05-10-17 " i passed out 2 months ago in the bathroom and broke some ribs"    . Tachy-brady syndrome ([place])    a. s/p MDT PPM 2013.  . Tricuspid regurgitation    Past Surgical History:  Procedure Laterality Date  . ABDOMINAL AORTOGRAM W/LOWER EXTREMITY Bilateral 02/15/2017    Procedure: ABDOMINAL AORTOGRAM W/LOWER EXTREMITY;  Surgeon: Lorretta Harp, MD;  Location: [place] CV LAB;  Service: Cardiovascular;  Laterality: Bilateral;  bilat  . Ablation of typical atrial flutter (CTI line)  09/05/2016   Dr Antonieta Pert at [facility]   . AUGMENTATION MAMMAPLASTY  1983  . BREAST IMPLANT REMOVAL Bilateral 01/14/2018   Procedure: REMOVAL BILATERAL BREAST IMPLANTS;  Surgeon: Irene Limbo, MD;  Location: [place];  Service: Plastics;  Laterality: Bilateral;  . CAPSULECTOMY Bilateral 01/14/2018   Procedure: CAPSULECTOMY;  Surgeon: Irene Limbo, MD;  Location: [place];  Service: Plastics;  Laterality: Bilateral;  . CARDIAC CATHETERIZATION N/A 01/01/2015   Procedure: Left Heart Cath and Coronary Angiography;  Surgeon: Jettie Booze, MD;  Location: [place] CV LAB;  Service: Cardiovascular;  Laterality: N/A;  . CARDIAC CATHETERIZATION N/A 05/12/2015   Procedure: Left Heart Cath and Coronary Angiography;  Surgeon: Troy Sine, MD;  Location: [place] CV LAB;  Service: Cardiovascular;  Laterality: N/A;  . CARDIAC CATHETERIZATION N/A 05/12/2015   Procedure: Coronary Stent Intervention;  Surgeon: Marcello Moores  Floyce Stakes, MD;  Location: [place] CV LAB;  Service: Cardiovascular;  Laterality: N/A;  . CARDIOVERSION N/A 02/01/2015   Procedure: CARDIOVERSION;  Surgeon: Lelon Perla, MD;  Location: [facility] ENDOSCOPY;  Service: Cardiovascular;  Laterality: N/A;  . CARDIOVERSION N/A 08/30/2015   Procedure: CARDIOVERSION;  Surgeon: Sanda Klein, MD;  Location: [place] ENDOSCOPY;  Service: Cardiovascular;  Laterality: N/A;  . CARPAL TUNNEL RELEASE  2008   "right hand/thumb; carpal tunnel repair; got rid of arthritis" (01/21/2012)  . CLIPPING OF ATRIAL APPENDAGE N/A 01/04/2015   Procedure: CLIPPING OF ATRIAL APPENDAGE;  Surgeon: Grace Isaac, MD;  Location: [place];  Service: Open Heart Surgery;  Laterality: N/A;  . COLONOSCOPY N/A 12/21/2015    Procedure: COLONOSCOPY;  Surgeon: Milus Banister, MD;  Location: [facility] ENDOSCOPY;  Service: Endoscopy;  Laterality: N/A;  . CORONARY ARTERY BYPASS GRAFT N/A 01/04/2015   Procedure: CORONARY ARTERY BYPASS GRAFTING (CABG) x 3 using left internal mammory artery and greater saphenous vein right leg harvested endoscopically.;  Surgeon: Grace Isaac, MD;  LIMA-LAD, SVG-RI, SVG-PDA  . ESOPHAGOGASTRODUODENOSCOPY (EGD) WITH PROPOFOL N/A 12/18/2015   Procedure: ESOPHAGOGASTRODUODENOSCOPY (EGD) WITH PROPOFOL;  Surgeon: Milus Banister, MD;  Location: [facility] ENDOSCOPY;  Service: Endoscopy;  Laterality: N/A;  . FACELIFT, LOWER 2/3  1995   "mini" (01/21/2012)  . GIVENS CAPSULE STUDY N/A 12/21/2015   Procedure: GIVENS CAPSULE STUDY;  Surgeon: Milus Banister, MD;  Location: [facility] ENDOSCOPY;  Service: Endoscopy;  Laterality: N/A;  . Lower Arterial Examination  10/28/2011   R. SFA stent mild-moderate mixed density plaque with elevated velocities consistent with 50% diameter reduction. L. SFA stent moderate mixed denisty plaque at mid to distal level consistent with 50-69% diameter reduction.  . LOWER EXTREMITY ANGIOGRAPHY N/A 02/15/2017   Procedure: LOWER EXTREMITY ANGIOGRAPHY;  Surgeon: Lorretta Harp, MD;  Location: [place] CV LAB;  Service: Cardiovascular;  Laterality: N/A;  . OOPHORECTOMY  ~1979  . PARATHYROIDECTOMY N/A 05/13/2017   Procedure: PARATHYROIDECTOMY;  Surgeon: Armandina Gemma, MD;  Location: [facility] ORS;  Service: General;  Laterality: N/A;  . PARTIAL COLECTOMY  2010  . PERIPHERAL ARTERIAL STENT GRAFT  2012; 2012   "LLE; RLE" (01/21/2012)  . PERIPHERAL VASCULAR BALLOON ANGIOPLASTY Right 02/15/2017   Procedure: PERIPHERAL VASCULAR BALLOON ANGIOPLASTY;  Surgeon: Lorretta Harp, MD;  Location: [place] CV LAB;  Service: Cardiovascular;  Laterality: Right;  SFA    . PERMANENT PACEMAKER INSERTION N/A 01/21/2012   Medtronic Adapta L implanted by Dr Sallyanne Kuster for tachy/brady syndrome  . POSTERIOR CERVICAL  LAMINECTOMY  1985  . TEE WITHOUT CARDIOVERSION N/A 01/04/2015   Procedure: TRANSESOPHAGEAL ECHOCARDIOGRAM (TEE);  Surgeon: Grace Isaac, MD;  Location: [place];  Service: Open Heart Surgery;  Laterality: N/A;  . TEE WITHOUT CARDIOVERSION N/A 02/01/2015   Procedure: TRANSESOPHAGEAL ECHOCARDIOGRAM (TEE);  Surgeon: Lelon Perla, MD;  Location: [facility] ENDOSCOPY;  Service: Cardiovascular;  Laterality: N/A;  . TEE WITHOUT CARDIOVERSION N/A 08/30/2015   Procedure: TRANSESOPHAGEAL ECHOCARDIOGRAM (TEE);  Surgeon: Sanda Klein, MD;  Location: [facility] ENDOSCOPY;  Service: Cardiovascular;  Laterality: N/A;  . VAGINAL HYSTERECTOMY  1975     Current Meds  Medication Sig  . alendronate (FOSAMAX) 70 MG tablet Take 70 mg by mouth every Thursday.   Marland Kitchen amLODipine (NORVASC) 5 MG tablet Take 5 mg by mouth 2 (two) times daily.  . Ascorbic Acid (VITAMIN C PO) Take 1 tablet by mouth daily.  . citalopram (CELEXA) 20 MG tablet TAKE 1/2 TABLET BY MOUTH DAILY  .  Coenzyme Q10 (CO Q-10) 100 MG CAPS Take 100 mg by mouth at bedtime.   . dofetilide (TIKOSYN) 250 MCG capsule Take 1 capsule (250 mcg total) by mouth 2 (two) times daily.  . furosemide (LASIX) 20 MG tablet Take 20 mg by mouth daily as needed for fluid or edema.   Marland Kitchen levothyroxine (SYNTHROID, LEVOTHROID) 25 MCG tablet Take 25 mcg by mouth daily before breakfast.   . MAGNESIUM CITRATE PO Take 400 mg by mouth daily.  . Multiple Vitamin (MULTIVITAMIN WITH MINERALS) TABS tablet Take 2 tablets by mouth daily.   Marland Kitchen NATTOKINASE PO Take by mouth. Takes 2000 fus daily  . nitroGLYCERIN (NITROSTAT) 0.4 MG SL tablet Place 1 tablet (0.4 mg total) under the tongue every 5 (five) minutes x 3 doses as needed for chest pain.  . Omega-3 Fatty Acids (SUPER OMEGA-3) 1000 MG CAPS Take 1,000 mg by mouth at bedtime.   Marland Kitchen OVER THE COUNTER MEDICATION   . Turmeric 500 MG TABS Take 1 tablet by mouth daily.     Allergies:   Brilinta [ticagrelor]; Clonidine derivatives; Atorvastatin; Crestor  [rosuvastatin calcium]; Exforge [amlodipine besylate-valsartan]; and Spironolactone   Social History   Tobacco Use  . Smoking status: Former Smoker    Packs/day: 0.75    Years: 10.00    Pack years: 7.50    Types: Cigarettes    Last attempt to quit: 2008    Years since quitting: 12.2  . Smokeless tobacco: Never Used  . Tobacco comment: 01/21/2012 "quit smoking ~ 2002"  Substance Use Topics  . Alcohol use: Yes    Comment: daily coctail  . Drug use: No     Family Hx: The patient's family history includes CVA in her brother; Heart attack in her brother; Heart disease in her brother, father, and mother; Hypertension in her brother and mother; Stroke in her father and mother.  ROS:   Please see the history of present illness.     All other systems reviewed and are negative.   Prior CV studies:   The following studies were reviewed today:  Most recent pacemaker download March 05 2018; arterial duplex study May 06, 2018  Labs/Other Tests and Data Reviewed:    EKG:  An ECG dated 10/22/2017 was personally reviewed today and demonstrated:  Atrial paced, ventricular sensed rhythm.  QTc 489 ms.  Nonspecific diffuse ST-T changes  Recent Labs: 02/07/2018: ALT 17 02/08/2018: BUN 23; Creatinine, Ser 0.96; Hemoglobin 13.7; Magnesium 2.5; Platelets 230; Potassium 4.7; Sodium 140   Recent Lipid Panel Lab Results  Component Value Date/Time   CHOL 222 (H) 10/22/2017 09:14 AM   TRIG 146 10/22/2017 09:14 AM   HDL 77 10/22/2017 09:14 AM   CHOLHDL 2.9 10/22/2017 09:14 AM   CHOLHDL 2.5 05/12/2015 03:24 AM   LDLCALC 116 (H) 10/22/2017 09:14 AM    Wt Readings from Last 3 Encounters:  05/26/18 147 lb (66.7 kg)  03/11/18 148 lb 12.8 oz (67.5 kg)  02/08/18 152 lb 1.9 oz (69 kg)     Objective:    Vital Signs:  BP 130/84   Pulse 85   Ht 5' 4.05" (1.627 m)   Wt 147 lb (66.7 kg)   BMI 25.19 kg/m   Unable to examine.  ASSESSMENT & PLAN:    1. PAD: She has significant  intermittent claudication, but no symptoms to suggest threatened limb.  Encourage her to continue exercising to the limit of her symptoms, trying to go a little father every time.  This will  help develop collaterals.  We will schedule a follow-up visit with Dr. Alvester Chou, once the current coronavirus restrictions are lifted. 2. CAD: She does not have angina pectoris.  Her only antianginal medication is amlodipine. 3. CHF: She does not have any symptoms of hypervolemia and very rarely takes furosemide.  She has not taken any recently. 4. PAF: Excellent response to dofetilide with very low burden of arrhythmia.  No atrial fibrillation in over 12 months.  She has had left atrial appendage clipping at the time of bypass surgery and is at low risk for embolic events.  She is not on anticoagulants. 5. HTN: For the most part her blood pressure is well controlled.  She reports that her typical blood pressure is in the 120s/70s.  She has symptoms that are strongly suggestive of orthostatic hypotension and I would not increase her antihypertensive medications.  She is currently tolerating the amlodipine without edema.  Encouraged her to stay well-hydrated, while avoiding excessive sodium in her diet.  Consider using compression stockings if she knows she is got the upright for a long time. 6. PM:  Normal device function, Continue remote downloads every 3 months. 7. HLP: statin intolerant. Once COVID-19 issues improved will bbring her in to discuss [place].  COVID-19 Education: The signs and symptoms of COVID-19 were discussed with the patient and how to seek care for testing (follow up with PCP or arrange E-visit).  The importance of social distancing was discussed today.  Time:   Today, I have spent 22 minutes with the patient with telehealth technology discussing the above problems.     Medication Adjustments/Labs and Tests Ordered: Current medicines are reviewed at length with the patient today.  Concerns  regarding medicines are outlined above.   Tests Ordered: No orders of the defined types were placed in this encounter.   Medication Changes: No orders of the defined types were placed in this encounter.   Disposition:  Follow up with Dr. Gwenlyn Found in 3 months. Get ECG  and BMET/Mg for dofetilide monitoring at that visit. With me in 6 months.  Signed, Sanda Klein, MD  05/26/2018 2:07 PM    [place]

## 2018-05-27 DIAGNOSIS — Z961 Presence of intraocular lens: Secondary | ICD-10-CM | POA: Diagnosis not present

## 2018-06-01 IMAGING — DX DG RIBS W/ CHEST 3+V*L*
4 series · 4 of 4 positions shown · non-contrast
Comparison: 02/20/2017

CLINICAL DATA: Fall 3 weeks ago with persistent left rib pain,
initial encounter

EXAM:
LEFT RIBS AND CHEST - 3+ VIEW

[chest pa]
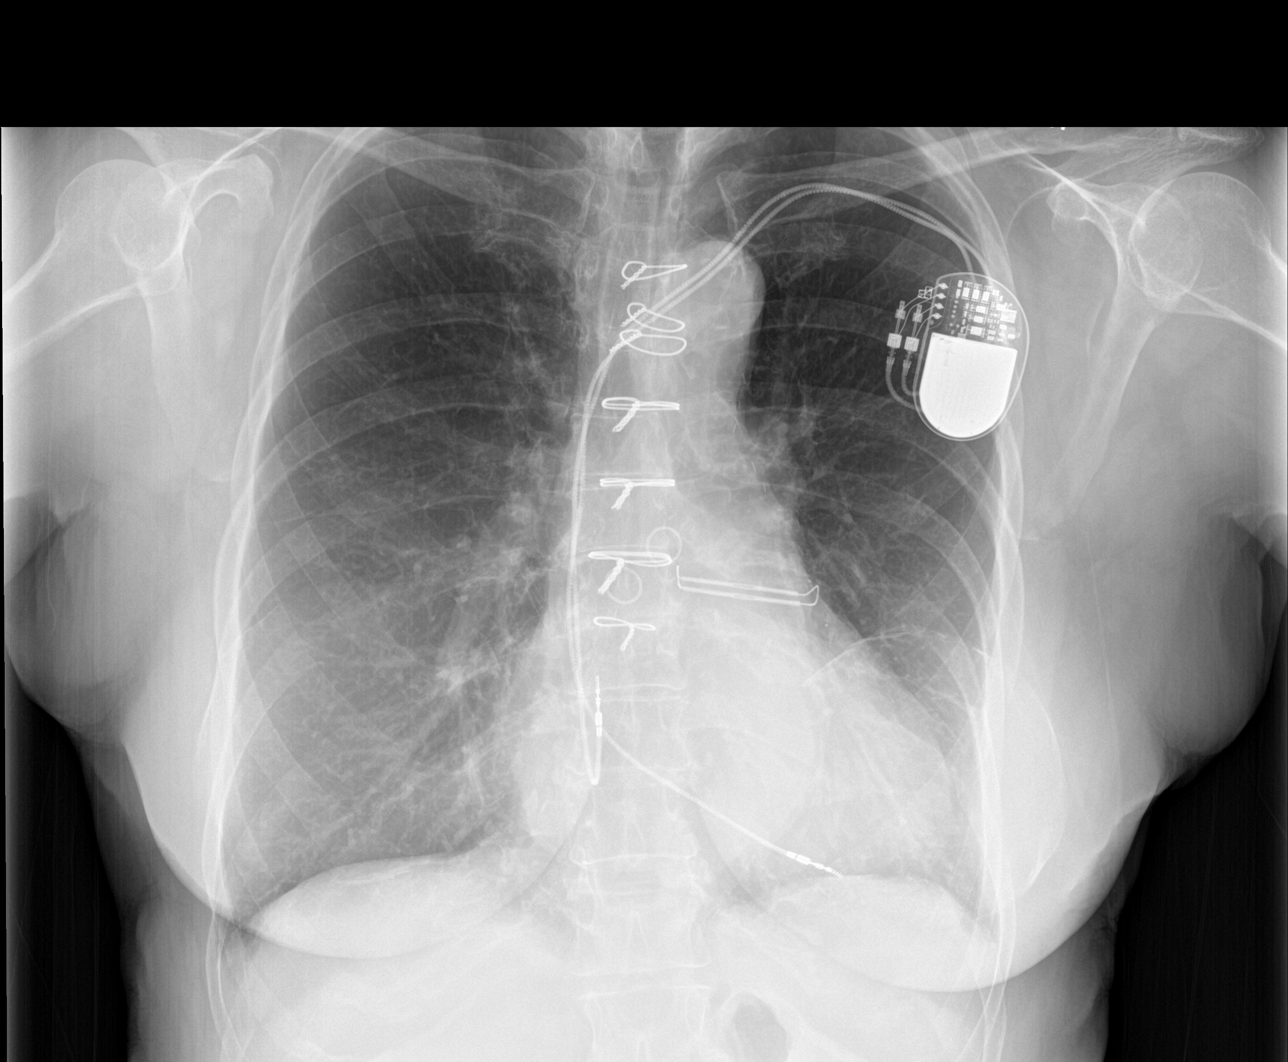

[rib pa]
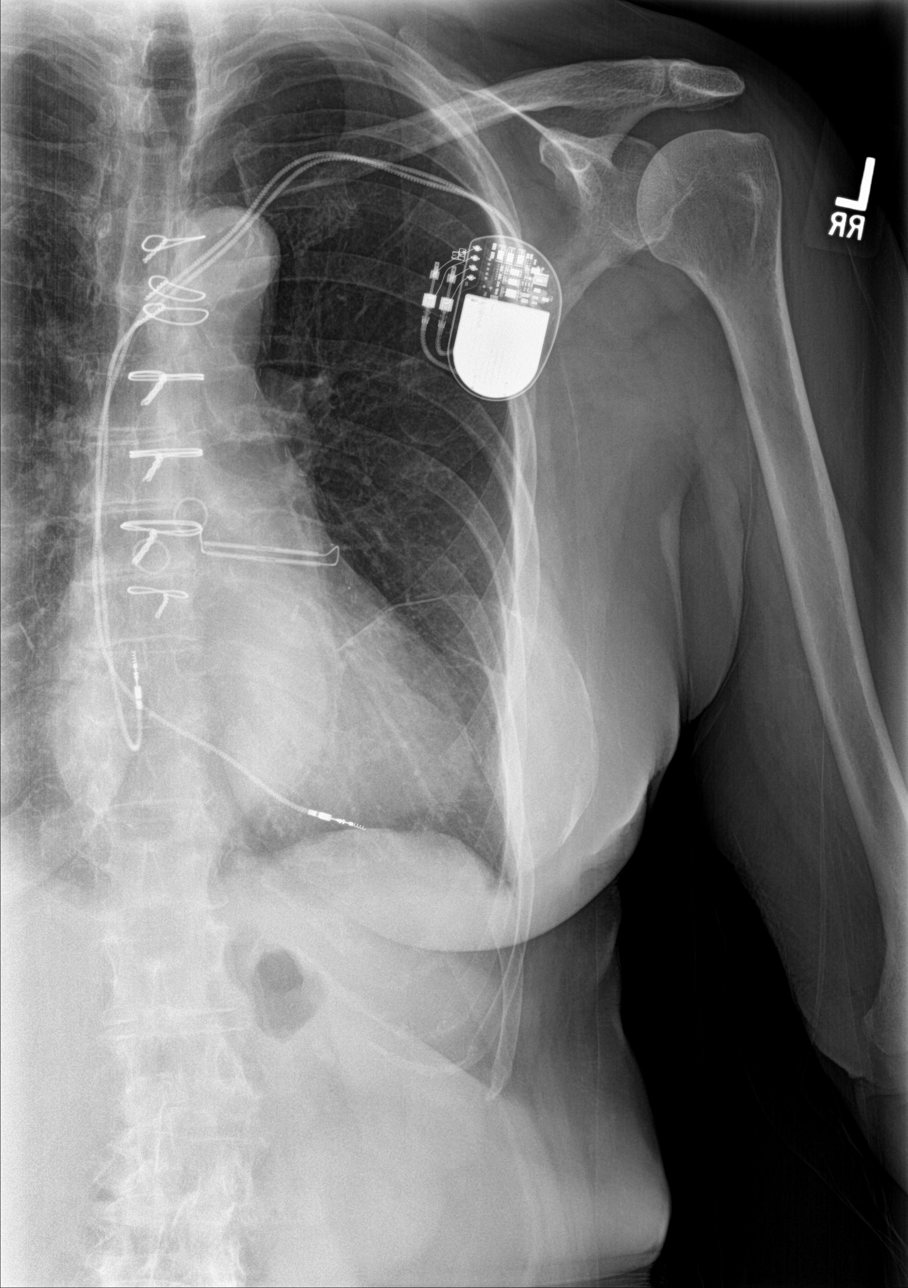

[rib obl (1 of 2)]
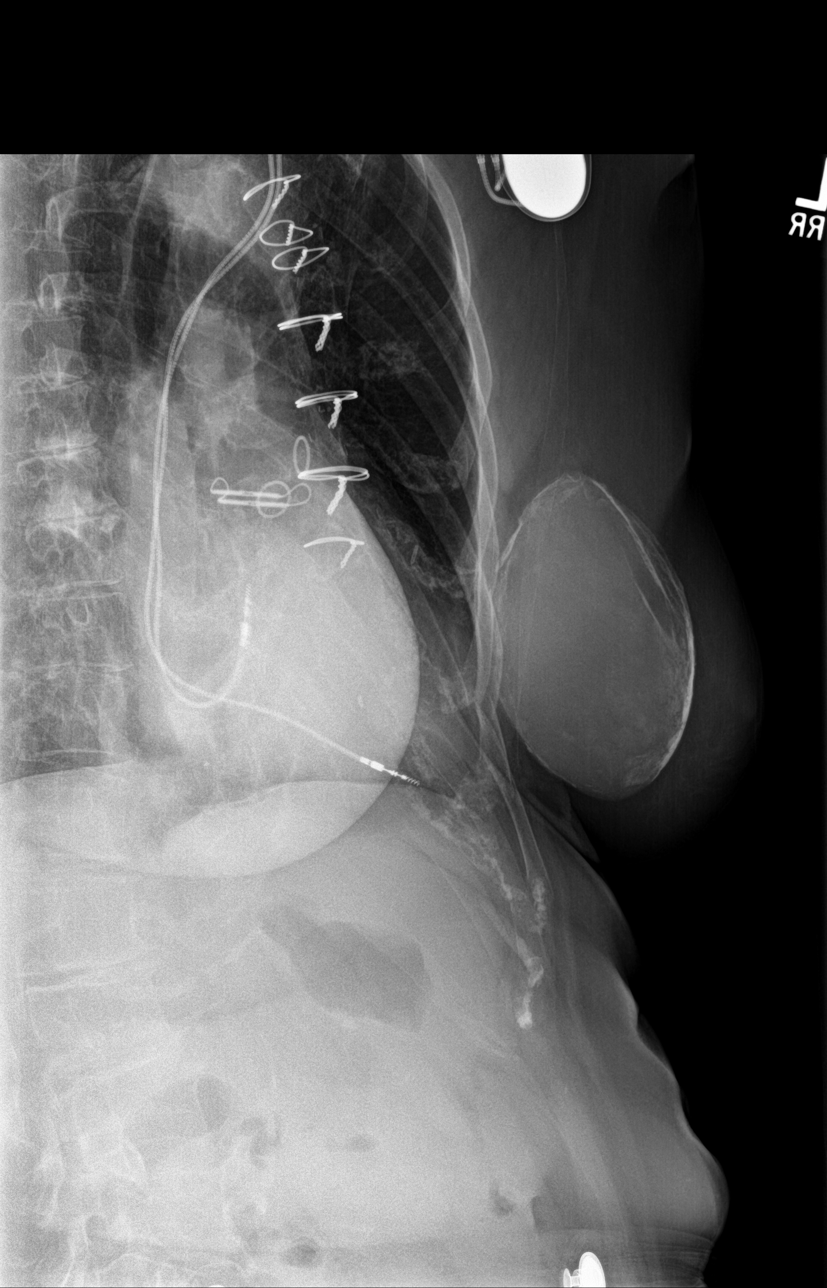

[rib obl (2 of 2)]
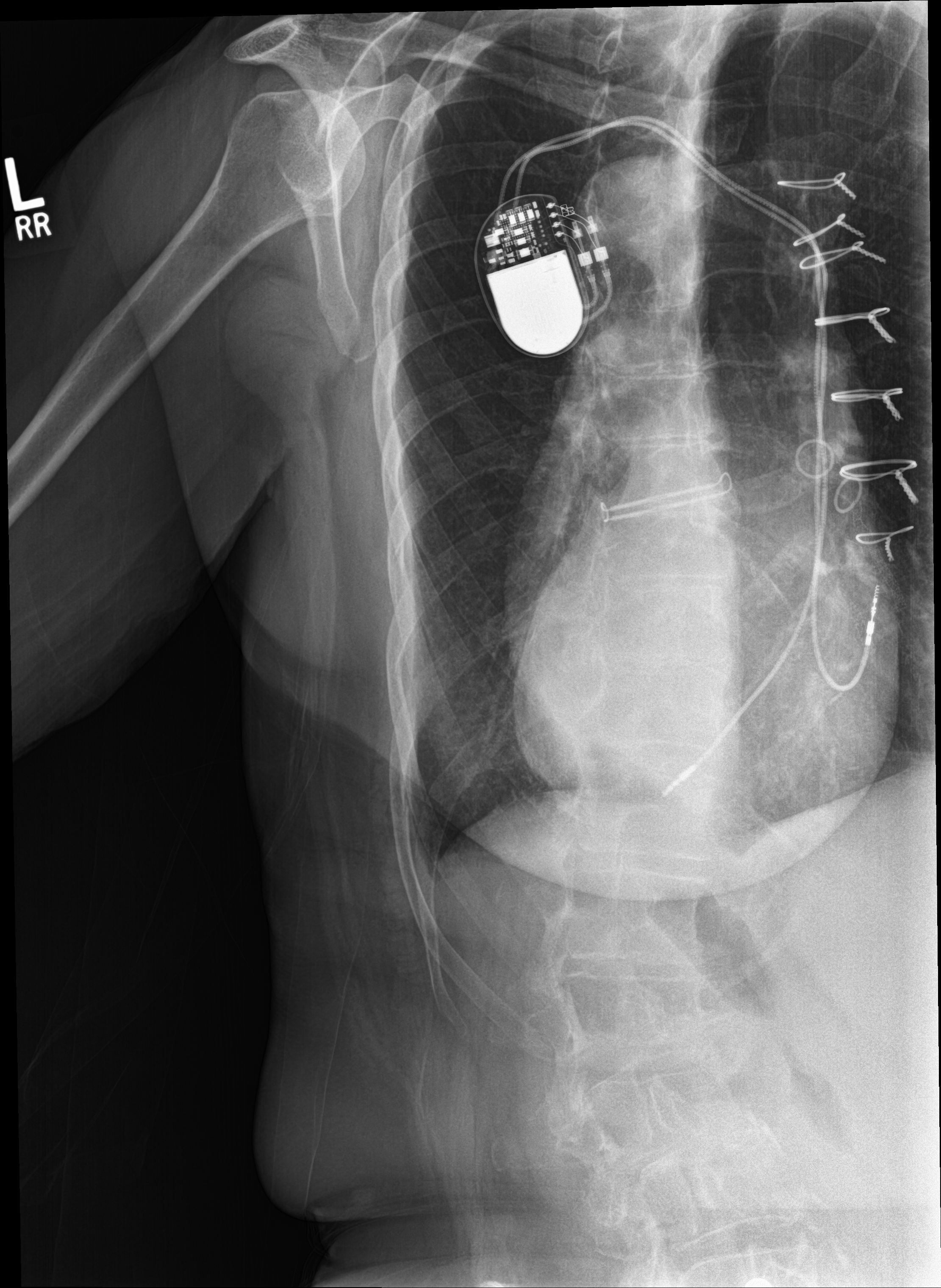

[4 of 4 positions shown; findings below may reference images not displayed]

FINDINGS: Cardiac shadow is mildly enlarged. Postsurgical changes are noted.
Pacing device is again seen and stable. Left breast implant is noted
with calcification. Lungs are clear bilaterally. No pneumothorax is
seen. Mildly displaced fractures of the left seventh and eighth ribs
are noted anterior laterally.
IMPRESSION: Mildly displaced left seventh and eighth rib fractures without
significant callus formation. No pneumothorax is noted.

## 2018-06-06 ENCOUNTER — Other Ambulatory Visit: Payer: Self-pay

## 2018-06-06 ENCOUNTER — Ambulatory Visit (INDEPENDENT_AMBULATORY_CARE_PROVIDER_SITE_OTHER): Payer: Medicare HMO | Admitting: *Deleted

## 2018-06-06 DIAGNOSIS — I480 Paroxysmal atrial fibrillation: Secondary | ICD-10-CM

## 2018-06-06 DIAGNOSIS — I495 Sick sinus syndrome: Secondary | ICD-10-CM

## 2018-06-06 LAB — CUP PACEART REMOTE DEVICE CHECK
Battery Impedance: 473 Ohm
Battery Remaining Longevity: 82 mo
Battery Voltage: 2.78 V
Brady Statistic AP VP Percent: 1 %
Brady Statistic AP VS Percent: 98 %
Brady Statistic AS VP Percent: 0 %
Brady Statistic AS VS Percent: 1 %
Date Time Interrogation Session: 20200427124740
Implantable Lead Implant Date: 20131212
Implantable Lead Implant Date: 20131212
Implantable Lead Location: 753859
Implantable Lead Location: 753860
Implantable Lead Model: 5076
Implantable Lead Model: 5076
Implantable Pulse Generator Implant Date: 20131212
Lead Channel Impedance Value: 393 Ohm
Lead Channel Impedance Value: 476 Ohm
Lead Channel Pacing Threshold Amplitude: 0.5 V
Lead Channel Pacing Threshold Amplitude: 0.75 V
Lead Channel Pacing Threshold Pulse Width: 0.4 ms
Lead Channel Pacing Threshold Pulse Width: 0.4 ms
Lead Channel Setting Pacing Amplitude: 2 V
Lead Channel Setting Pacing Amplitude: 2.5 V
Lead Channel Setting Pacing Pulse Width: 0.4 ms
Lead Channel Setting Sensing Sensitivity: 4 mV

## 2018-06-09 ENCOUNTER — Telehealth: Payer: Self-pay | Admitting: Cardiovascular Disease

## 2018-06-09 ENCOUNTER — Other Ambulatory Visit: Payer: Self-pay | Admitting: Cardiovascular Disease

## 2018-06-09 MED ORDER — AMLODIPINE BESYLATE 5 MG PO TABS: 7.5000 mg | ORAL_TABLET | Freq: Every day | ORAL | 1 refills | Status: DC

## 2018-06-09 NOTE — Telephone Encounter (Signed)
°*  STAT* If patient is at the pharmacy, call can be transferred to refill team.   1. Which medications need to be refilled? (please list name of each medication and dose if known) amlodipine 5 mg   2. Which pharmacy/location (including street and city if local pharmacy) is medication to be sent to? [facility]  3. Do they need a 30 day or 90 day supply? 90 but need to explain dr.Croitoru need to change it to 1 and half.

## 2018-06-09 NOTE — Telephone Encounter (Signed)
Am;odipine refilled.

## 2018-06-09 NOTE — Telephone Encounter (Signed)
Yes, I believe that is indeed the case. 7.5 mg daily

## 2018-06-09 NOTE — Telephone Encounter (Signed)
Amlodipine refilled.

## 2018-06-13 NOTE — Progress Notes (Signed)
Remote pacemaker transmission.   

## 2018-06-27 ENCOUNTER — Telehealth: Payer: Self-pay | Admitting: Cardiovascular Disease

## 2018-06-27 DIAGNOSIS — R531 Weakness: Secondary | ICD-10-CM

## 2018-06-27 DIAGNOSIS — R0602 Shortness of breath: Secondary | ICD-10-CM

## 2018-06-27 NOTE — Telephone Encounter (Signed)
Remote PPM transmission received and reviewed. Normal device function. Lead trends stable. <0.1% AT/AF burden, no EGMs due to short duration. 1 VHR episode from 03/14/18, previously reviewed.  Histograms appropriate.

## 2018-06-27 NOTE — Telephone Encounter (Signed)
If anything that BP is rather low. Please arrange for a BMET, Mg, BNP. Ask her to avoid additional furosemide until we get the results, and please do an additional pacer download today.

## 2018-06-27 NOTE — Telephone Encounter (Signed)
Pt aware and will send device download today and will come in tom for labs ./cy

## 2018-06-27 NOTE — Telephone Encounter (Signed)
Spoke with pt for long time and has noted SOB for approx 1 month  "not feeling well" has weakness and  Pt pt feels like she has  to catch her breath. B/P this am was 107/77 and heart rate was 89 sitting down and standing up B/P was 100/74 and HR was 82.Pt has taken Furosemide last night and this am with no change Will forward to Dr Sallyanne Kuster for review and recommendations ./cy

## 2018-06-27 NOTE — Telephone Encounter (Signed)
New message   Pt c/o Shortness Of Breath: STAT if SOB developed within the last 24 hours or pt is noticeably SOB on the phone  1. Are you currently SOB (can you hear that pt is SOB on the phone)? No   2. How long have you been experiencing SOB? For about a month  3. Are you SOB when sitting or when up moving around? Moving around   4. Are you currently experiencing any other symptoms?weakness

## 2018-06-28 NOTE — Telephone Encounter (Signed)
Thanks, Emily

## 2018-06-29 DIAGNOSIS — I10 Essential (primary) hypertension: Secondary | ICD-10-CM | POA: Diagnosis not present

## 2018-06-29 DIAGNOSIS — I480 Paroxysmal atrial fibrillation: Secondary | ICD-10-CM | POA: Diagnosis not present

## 2018-06-29 DIAGNOSIS — I25810 Atherosclerosis of coronary artery bypass graft(s) without angina pectoris: Secondary | ICD-10-CM | POA: Diagnosis not present

## 2018-06-29 LAB — BASIC METABOLIC PANEL
BUN/Creatinine Ratio: 18 (ref 12–28)
BUN: 17 mg/dL (ref 8–27)
CO2: 25 mmol/L (ref 20–29)
Calcium: 9.8 mg/dL (ref 8.7–10.3)
Chloride: 98 mmol/L (ref 96–106)
Creatinine, Ser: 0.93 mg/dL (ref 0.57–1.00)
GFR calc Af Amer: 68 mL/min/{1.73_m2} (ref >59)
GFR calc non Af Amer: 59 mL/min/{1.73_m2} — ABNORMAL LOW (ref >59)
Glucose: 134 mg/dL — ABNORMAL HIGH (ref 65–99)
Potassium: 5.3 mmol/L — ABNORMAL HIGH (ref 3.5–5.2)
Sodium: 138 mmol/L (ref 134–144)

## 2018-06-29 LAB — MAGNESIUM: Magnesium: 2 mg/dL (ref 1.6–2.3)

## 2018-06-30 ENCOUNTER — Telehealth: Payer: Self-pay | Admitting: *Deleted

## 2018-06-30 DIAGNOSIS — R69 Illness, unspecified: Secondary | ICD-10-CM | POA: Diagnosis not present

## 2018-06-30 NOTE — Telephone Encounter (Signed)
-----   Message from Sanda Klein, MD sent at 06/29/2018  5:44 PM EDT ----- Labs are pretty normal. Borderline high potassium , but she is not taking potassium supplements or meds that could raise the potassium. Normal kidney function - does not appear "dry"/dehydrated. Glucose a little high, but normal for a non-fasting sample. No rhythm problems on pacemaker. I'm afraid I cannot identify a cardiac or medication related reason for the complaints.

## 2018-06-30 NOTE — Telephone Encounter (Signed)
Left a message for the patient to call back.  

## 2018-07-07 NOTE — Telephone Encounter (Signed)
Patient made aware of results and verbalized understanding. She stated that she feels a lot better now. If anything changes she has been advised to call back.

## 2018-07-12 ENCOUNTER — Ambulatory Visit: Payer: Medicare HMO | Admitting: Cardiovascular Disease

## 2018-07-12 DIAGNOSIS — R200 Anesthesia of skin: Secondary | ICD-10-CM | POA: Diagnosis not present

## 2018-07-27 DIAGNOSIS — R69 Illness, unspecified: Secondary | ICD-10-CM | POA: Diagnosis not present

## 2018-08-10 DIAGNOSIS — R200 Anesthesia of skin: Secondary | ICD-10-CM | POA: Diagnosis not present

## 2018-08-11 ENCOUNTER — Other Ambulatory Visit: Payer: Self-pay

## 2018-08-11 ENCOUNTER — Telehealth: Payer: Self-pay | Admitting: Emergency Medicine

## 2018-08-11 ENCOUNTER — Telehealth: Payer: Medicare HMO | Admitting: Emergency Medicine

## 2018-08-11 ENCOUNTER — Telehealth (INDEPENDENT_AMBULATORY_CARE_PROVIDER_SITE_OTHER): Payer: Medicare HMO | Admitting: Emergency Medicine

## 2018-08-11 ENCOUNTER — Encounter: Payer: Self-pay | Admitting: Emergency Medicine

## 2018-08-11 VITALS — Ht 64.5 in | Wt 147.0 lb

## 2018-08-11 DIAGNOSIS — F332 Major depressive disorder, recurrent severe without psychotic features: Secondary | ICD-10-CM

## 2018-08-11 DIAGNOSIS — R69 Illness, unspecified: Secondary | ICD-10-CM | POA: Diagnosis not present

## 2018-08-11 NOTE — Telephone Encounter (Signed)
No answer

## 2018-08-11 NOTE — Progress Notes (Signed)
1. PAD: She has significant intermittent claudication, but no symptoms to suggest threatened limb.  Encourage her to continue exercising to the limit of her symptoms, trying to go a little father every time.  This will help develop collaterals.  We will schedule a follow-up visit with Dr. Alvester Chou, once the current coronavirus restrictions are lifted. 2. CAD: She does not have angina pectoris.  Her only antianginal medication is amlodipine. 3. CHF: She does not have any symptoms of hypervolemia and very rarely takes furosemide.  She has not taken any recently. 4. PAF: Excellent response to dofetilide with very low burden of arrhythmia.  No atrial fibrillation in over 12 months.  She has had left atrial appendage clipping at the time of bypass surgery and is at low risk for embolic events.  She is not on anticoagulants. 5. HTN: For the most part her blood pressure is well controlled.  She reports that her typical blood pressure is in the 120s/70s.  She has symptoms that are strongly suggestive of orthostatic hypotension and I would not increase her antihypertensive medications.  She is currently tolerating the amlodipine without edema.  Encouraged her to stay well-hydrated, while avoiding excessive sodium in her diet.  Consider using compression stockings if she knows she is got the upright for a long time. 6. PM:  Normal device function, Continue remote downloads every 3 months. 7. HLP: statin intolerant. Once COVID-19 issues improved will bbring her in to discuss [place].     Telemedicine Encounter- SOAP NOTE Established Patient  This telephone encounter was conducted with the patient's (or proxy's) verbal consent via audio telecommunications: yes Patient was instructed to have this encounter in a suitably private space; and to only have persons present to whom they give permission to participate. In addition, patient identity was confirmed by use of name plus two identifiers (DOB and address).  I  discussed the limitations, risks, security and privacy concerns of performing an evaluation and management service by telephone and the availability of in person appointments. I also discussed with the patient that there may be a patient responsible charge related to this service. The patient expressed understanding and agreed to proceed.    No chief complaint on file. Depression  Subjective   [name] is a 78 y.o. female established patient. Telephone visit today for evaluation of depression.  Patient also has a history of chronic depression but much worse the last several months.  Has been taking Celexa 5 mg a day for at least 10 years.  Not sure who initially prescribed it.  Has had suicidal ideas but states "I would not do that to my family".  Feels like she is "as down as ever". Depression screen [facility] 2/9 08/11/2018 03/11/2018 11/10/2017 03/25/2017 03/10/2017  Decreased Interest 3 0 0 1 0  Down, Depressed, Hopeless 3 0 0 1 0  PHQ - 2 Score 6 0 0 2 0  Altered sleeping 1 - - - -  Tired, decreased energy 3 - - - -  Change in appetite 3 - - - -  Feeling bad or failure about yourself  2 - - - -  Trouble concentrating 0 - - - -  Moving slowly or fidgety/restless 0 - - - -  Suicidal thoughts 3 - - - -  PHQ-9 Score 18 - - - -  Difficult doing work/chores Very difficult - - - -  Some recent data might be hidden     HPI   Patient Active Problem List  Diagnosis Date Noted  . Hypothyroidism (acquired) 02/08/2018  . Coronary artery disease involving coronary bypass graft of native heart without angina pectoris 10/22/2017  . Dyslipidemia 10/22/2017  . Cerebrovascular small vessel disease 04/27/2017  . Gait abnormality 03/25/2017  . Cerebrovascular disease 03/25/2017  . Claudication in peripheral vascular disease ([place]) 02/15/2017  . Hyperparathyroidism ([place]) 05/13/2016  . Pacemaker 01/10/2016  . Benign neoplasm of ascending colon   . Long term current use of anticoagulant   . CKD  (chronic kidney disease), stage II   . H/O amiodarone therapy 12/04/2015  . Chronic diastolic heart failure ([place]) 10/19/2015  . Atypical atrial flutter ([place])   . Paroxysmal atrial flutter ([place]) 08/23/2015  . Chronic fatigue 06/27/2015  . Stented coronary artery   . NSTEMI (non-ST elevated myocardial infarction) ([place]) 05/11/2015  . S/P CABG x 3 01/04/15 01/10/2015  . CAD (coronary artery disease) 01/04/2015  . PAF (paroxysmal atrial fibrillation) ([place]) 01/22/2012  . Sinus node dysfunction ([place]) 01/22/2012  . S/P placement of cardiac pacemaker, medtronic adapta 01/21/12 01/22/2012  . PVD (peripheral vascular disease), hx stents to bil SFAs 02/2010 01/22/2012  . Hyperlipidemia 12/31/2009  . Anxiety 12/31/2009  . DEPRESSION 12/31/2009  . Essential hypertension 12/31/2009  . Coronary atherosclerosis 12/31/2009  . OSTEOARTHRITIS, HAND 12/31/2009  . [place] DISEASE, CERVICAL 12/31/2009  . [place] DISEASE, LUMBAR 12/31/2009  . UNSPECIFIED URINARY INCONTINENCE 12/31/2009    Past Medical History:  Diagnosis Date  . ANXIETY 12/31/2009  . Aortic insufficiency    a. mod by echo 2017.  Marland Kitchen CAD (coronary artery disease)    a. s/p CABGx3 and LAA clipping in 12/2014, NSTEMI 05/2015 s/p DES to native RCA and SVG-dRCA; occluded ramus-SVG was treated medically). c. neg nuc 10/2015 at [facility].  . Chronic diastolic CHF (congestive heart failure) ([place])   . Chronic fatigue   . CKD (chronic kidney disease), stage II   . DEPRESSION 12/31/2009  . [place] DISEASE, CERVICAL 12/31/2009  . [place] DISEASE, LUMBAR 12/31/2009  . DIVERTICULITIS, HX OF 12/31/2009  . Essential hypertension   . Gait abnormality 03/25/2017  . GLUCOSE INTOLERANCE 12/31/2009  . Habitual alcohol use   . History of stroke    self reports " they say i have had some pin strokes"   . Hypercalcemia   . Hyperkalemia   . Hyperlipidemia 12/31/2009  . Hypothyroidism   . MENOPAUSE, EARLY 12/31/2009  . NSTEMI (non-ST elevated myocardial infarction) ([place])  05/11/2015  . Orthostatic hypotension   . OSTEOARTHRITIS, HAND   . Pacemaker 01/21/2012   MDT Adapta dual chamber  . Paroxysmal atrial flutter ([place])   . Persistent atrial fibrillation   . PVD (peripheral vascular disease), hx stents to bil SFAs 02/2010   . Rash, skin     superficial raised red pencil point sized rash bilateral forearms; states " it started when i started the Plavix "   . Ruptured left breast implant   . Symptomatic sinus bradycardia 01/22/2012  . Syncope    a. 11/2015 ? due to medications.  . Syncope    reports on 05-10-17 " i passed out 2 months ago in the bathroom and broke some ribs"    . Tachy-brady syndrome ([place])    a. s/p MDT PPM 2013.  . Tricuspid regurgitation     Current Outpatient Medications  Medication Sig Dispense Refill  . alendronate (FOSAMAX) 70 MG tablet Take 70 mg by mouth every Thursday.     Marland Kitchen amLODipine (NORVASC) 5 MG tablet Take 1.5 tablets (7.5 mg total)  by mouth daily. 135 tablet 1  . Ascorbic Acid (VITAMIN C PO) Take 1 tablet by mouth daily.    Marland Kitchen aspirin EC 81 MG tablet Take 81 mg by mouth daily.    . citalopram (CELEXA) 20 MG tablet TAKE 1/2 TABLET BY MOUTH DAILY 45 tablet 1  . Coenzyme Q10 (CO Q-10) 100 MG CAPS Take 100 mg by mouth at bedtime.     . dofetilide (TIKOSYN) 250 MCG capsule Take 1 capsule (250 mcg total) by mouth 2 (two) times daily. 180 capsule 3  . furosemide (LASIX) 20 MG tablet Take 20 mg by mouth daily as needed for fluid or edema.     Marland Kitchen levothyroxine (SYNTHROID, LEVOTHROID) 25 MCG tablet Take 25 mcg by mouth daily before breakfast.     . MAGNESIUM CITRATE PO Take 400 mg by mouth daily.    . Multiple Vitamin (MULTIVITAMIN WITH MINERALS) TABS tablet Take 2 tablets by mouth daily.     Marland Kitchen NATTOKINASE PO Take by mouth. Takes 2000 fus daily    . nitroGLYCERIN (NITROSTAT) 0.4 MG SL tablet Place 1 tablet (0.4 mg total) under the tongue every 5 (five) minutes x 3 doses as needed for chest pain. 25 tablet 2  . Omega-3 Fatty Acids (SUPER  OMEGA-3) 1000 MG CAPS Take 1,000 mg by mouth at bedtime.     Marland Kitchen OVER THE COUNTER MEDICATION     . Turmeric 500 MG TABS Take 1 tablet by mouth daily.     No current facility-administered medications for this visit.     Allergies  Allergen Reactions  . Brilinta [Ticagrelor] Shortness Of Breath  . Clonidine Derivatives Other (See Comments)    Lowers heart rate  . Atorvastatin Other (See Comments)    Possible cause of fatigue/malaise  . Crestor [Rosuvastatin Calcium] Other (See Comments)    Joint/muscle aches  . Exforge [Amlodipine Besylate-Valsartan] Itching and Rash  . Spironolactone Other (See Comments)    Contraindicated with history of hyperkalemia    Social History   Socioeconomic History  . Marital status: Widowed    Spouse name: Not on file  . Number of children: 3  . Years of education: Not on file  . Highest education level: Not on file  Occupational History  . 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 Financial resource strain: Not on file  . Food insecurity    Worry: Not on file    Inability: Not on file  . Transportation needs    Medical: Not on file    Non-medical: Not on file  Tobacco Use  . Smoking status: Former Smoker    Packs/day: 0.75    Years: 10.00    Pack years: 7.50    Types: Cigarettes    Quit date: 2008    Years since quitting: 12.5  . Smokeless tobacco: Never Used  . Tobacco comment: 01/21/2012 "quit smoking ~ 2002"  Substance and Sexual Activity  . Alcohol use: Yes    Comment: daily coctail  . Drug use: No  . Sexual activity: Not Currently  Lifestyle  . Physical activity    Days per week: Not on file    Minutes per session: Not on file  . Stress: Not on file  Relationships  . Social Herbalist on phone: Not on file    Gets together: Not on file    Attends religious service: Not on file    Active member of club or organization:  Not on file    Attends meetings of clubs or organizations:  Not on file    Relationship status: Not on file  . Intimate partner violence    Fear of current or ex partner: Not on file    Emotionally abused: Not on file    Physically abused: Not on file    Forced sexual activity: Not on file  Other Topics Concern  . Not on file  Social History Narrative   Lives in [place].  Retired.    Review of Systems  Constitutional: Negative.  Negative for chills and fever.  HENT: Negative.  Negative for hearing loss and sore throat.   Eyes: Negative.  Negative for blurred vision and double vision.  Respiratory: Negative.  Negative for cough and shortness of breath.   Cardiovascular: Negative.  Negative for chest pain and palpitations.  Gastrointestinal: Negative.  Negative for abdominal pain, diarrhea, nausea and vomiting.  Genitourinary: Negative.  Negative for dysuria.  Musculoskeletal: Negative.  Negative for back pain and myalgias.  Skin: Negative.  Negative for rash.  Neurological: Negative.  Negative for dizziness and headaches.  Endo/Heme/Allergies: Negative.   Psychiatric/Behavioral: Positive for depression and suicidal ideas.  All other systems reviewed and are negative.   Objective  Alert and oriented x3 in no apparent respiratory distress.  Calm and very aware of her present situation.  Vitals as reported by the patient: None available  There were no vitals filed for this visit.  There are no diagnoses linked to this encounter. Huyen was seen today for depression.  Diagnoses and all orders for this visit:  Severe episode of recurrent major depressive disorder, without psychotic features ([place])   Patient severely depressed.  Advised to go to [facility] ED for further evaluation and possible inpatient treatment.  Patient agrees and is headed there now.  I discussed the assessment and treatment plan with the patient. The patient was provided an opportunity to ask questions and all were answered. The patient agreed with the  plan and demonstrated an understanding of the instructions.   The patient was advised to call back or seek an in-person evaluation if the symptoms worsen or if the condition fails to improve as anticipated.    Horald Pollen, MD  Primary Care at [facility]

## 2018-08-25 ENCOUNTER — Other Ambulatory Visit: Payer: Self-pay | Admitting: Emergency Medicine

## 2018-08-25 NOTE — Telephone Encounter (Signed)
Pt calling to check status. Pt states that she was instructed by Dr Mitchel Honour to take 40mg  daily. Please advise

## 2018-08-25 NOTE — Telephone Encounter (Signed)
Requested medication (s) are due for refill today: Yes  Requested medication (s) are on the active medication list: Yes  Last refill:  03/22/18  Future visit scheduled: No  Notes to clinic:  See request    Requested Prescriptions  Pending Prescriptions Disp Refills   citalopram (CELEXA) 20 MG tablet [Pharmacy Med Name: CITALOPRAM HBR 20 MG TABLET] 45 tablet 1    Sig: TAKE 1/2 TABLET BY MOUTH EVERY DAY     There is no refill protocol information for this order

## 2018-08-29 NOTE — Telephone Encounter (Signed)
Patient is requesting a refill of the following medications: Requested Prescriptions   Pending Prescriptions Disp Refills  . citalopram (CELEXA) 20 MG tablet [Pharmacy Med Name: CITALOPRAM HBR 20 MG TABLET] 45 tablet 1    Sig: TAKE 1/2 TABLET BY MOUTH EVERY DAY    Date of patient request: 08/25/2018 Last office visit: 08/11/2018 Date of last refill: 03/22/2018 Last refill amount: #45 wuth 1 refill Follow up time period per chart: n/a

## 2018-08-29 NOTE — Telephone Encounter (Addendum)
Pt is calling and she needs a refill on citalopram. [facility] [address]

## 2018-08-29 NOTE — Telephone Encounter (Signed)
Pt Pt is calling and she needs a refill on citalopram. [facility] [address] Called back regarding her citalopram .. gave to clinical who is going to talk to provider .

## 2018-08-29 NOTE — Telephone Encounter (Signed)
I spoke to her on 08/11/2018 when she was having a severe exacerbation of her chronic depression.  She was advised to go to the emergency room but I never heard from her.  Please call her and inquire about this before we prescribe medication again.  Thanks.

## 2018-08-30 DIAGNOSIS — R69 Illness, unspecified: Secondary | ICD-10-CM | POA: Diagnosis not present

## 2018-09-01 ENCOUNTER — Telehealth: Payer: Self-pay | Admitting: Emergency Medicine

## 2018-09-01 NOTE — Telephone Encounter (Signed)
Copied from [place]. Topic: General - Other >> Sep 01, 2018  9:22 AM [name] wrote: Reason for CRM: pt called and has some questions about her citalopram (CELEXA) 20 MG tablet [[id number]] .  She stated that the dose changed and she is taking for then 1/2 [name] pill.  She stated that last she was in the office she as told to double up on it.  She would like to speak with nurse about the new script that was sent in?   Best number [phone]

## 2018-09-01 NOTE — Telephone Encounter (Signed)
Copied from [place]. Topic: General - Other >> Sep 01, 2018  9:22 AM Percell Belt A wrote: Reason for CRM: pt called and has some questions about her citalopram (CELEXA) 20 MG tablet [[id number]] .  She stated that the dose changed and she is taking for then 1/2 a pill.  She stated that last she was in the office she as told to double up on it.  She would like to speak with nurse about the new script that was sent in?   Best number [phone]

## 2018-09-02 NOTE — Telephone Encounter (Signed)
Patient was last seen by you on 08/11/18. Patient have an upcoming appt on 09/07/18. Patient was sent in a rx for celexa 20mg  she was told on last visit to double up on the Celexa which is 40 mg and she states this dose is working for her and she wanted to let you know.

## 2018-09-03 NOTE — Telephone Encounter (Signed)
Thanks. I will see her on the 29th.

## 2018-09-05 ENCOUNTER — Encounter: Payer: Medicare HMO | Admitting: *Deleted

## 2018-09-06 ENCOUNTER — Telehealth: Payer: Self-pay

## 2018-09-06 NOTE — Telephone Encounter (Signed)
Left message for patient to remind of missed remote transmission.  

## 2018-09-07 ENCOUNTER — Telehealth: Payer: Self-pay | Admitting: *Deleted

## 2018-09-07 ENCOUNTER — Telehealth: Payer: Self-pay | Admitting: Emergency Medicine

## 2018-09-07 ENCOUNTER — Other Ambulatory Visit: Payer: Self-pay

## 2018-09-07 ENCOUNTER — Telehealth: Payer: Self-pay

## 2018-09-07 ENCOUNTER — Encounter: Payer: Self-pay | Admitting: Emergency Medicine

## 2018-09-07 ENCOUNTER — Telehealth (INDEPENDENT_AMBULATORY_CARE_PROVIDER_SITE_OTHER): Payer: Medicare HMO | Admitting: Emergency Medicine

## 2018-09-07 DIAGNOSIS — F332 Major depressive disorder, recurrent severe without psychotic features: Secondary | ICD-10-CM | POA: Diagnosis not present

## 2018-09-07 DIAGNOSIS — R69 Illness, unspecified: Secondary | ICD-10-CM | POA: Diagnosis not present

## 2018-09-07 MED ORDER — CITALOPRAM HYDROBROMIDE 40 MG PO TABS: 40.0000 mg | ORAL_TABLET | Freq: Every day | ORAL | 3 refills | Status: DC

## 2018-09-07 NOTE — Telephone Encounter (Signed)
Called [facility] on [address], spoke to [place] to cancel first Rx for Celexa 20 mg, per Dr Mitchel Honour. The doctor changed to Celexa 40 mg.

## 2018-09-07 NOTE — Telephone Encounter (Signed)
No answer

## 2018-09-07 NOTE — Progress Notes (Addendum)
Telemedicine Encounter- SOAP NOTE Established Patient  This telephone encounter was conducted with the patient's (or proxy's) verbal consent via audio telecommunications: yes/no: Yes Patient was instructed to have this encounter in a suitably private space; and to only have persons present to whom they give permission to participate. In addition, patient identity was confirmed by use of name plus two identifiers (DOB and address).  I discussed the limitations, risks, security and privacy concerns of performing an evaluation and management service by telephone and the availability of in person appointments. I also discussed with the patient that there may be a patient responsible charge related to this service. The patient expressed understanding and agreed to proceed.   No chief complaint on file. Follow-up of anxiety and depression  Subjective   [name] is a 78 y.o. female established patient. Telephone visit today for follow-up of anxiety/depression.  Has been depressed and anxious for the past year.  Presently taking Celexa 40 mg in the morning.  Doing well.  Needs medication refill.  Doing better than last time we spoke earlier this month.  Has been seeing a therapist but needs referral to a psychiatrist.  Denies suicidal ideas at present time.   HPI   Patient Active Problem List   Diagnosis Date Noted  . Hypothyroidism (acquired) 02/08/2018  . Coronary artery disease involving coronary bypass graft of native heart without angina pectoris 10/22/2017  . Dyslipidemia 10/22/2017  . Cerebrovascular small vessel disease 04/27/2017  . Gait abnormality 03/25/2017  . Cerebrovascular disease 03/25/2017  . Claudication in peripheral vascular disease ([place]) 02/15/2017  . Hyperparathyroidism ([place]) 05/13/2016  . Pacemaker 01/10/2016  . Benign neoplasm of ascending colon   . Long term current use of anticoagulant   . CKD (chronic kidney disease), stage II   . H/O amiodarone therapy  12/04/2015  . Chronic diastolic heart failure ([place]) 10/19/2015  . Atypical atrial flutter ([place])   . Paroxysmal atrial flutter ([place]) 08/23/2015  . Chronic fatigue 06/27/2015  . Stented coronary artery   . NSTEMI (non-ST elevated myocardial infarction) ([place]) 05/11/2015  . S/P CABG x 3 01/04/15 01/10/2015  . CAD (coronary artery disease) 01/04/2015  . PAF (paroxysmal atrial fibrillation) ([place]) 01/22/2012  . Sinus node dysfunction ([place]) 01/22/2012  . S/P placement of cardiac pacemaker, medtronic adapta 01/21/12 01/22/2012  . PVD (peripheral vascular disease), hx stents to bil SFAs 02/2010 01/22/2012  . Hyperlipidemia 12/31/2009  . Anxiety 12/31/2009  . DEPRESSION 12/31/2009  . Essential hypertension 12/31/2009  . Coronary atherosclerosis 12/31/2009  . OSTEOARTHRITIS, HAND 12/31/2009  . [place] DISEASE, CERVICAL 12/31/2009  . [place] DISEASE, LUMBAR 12/31/2009  . UNSPECIFIED URINARY INCONTINENCE 12/31/2009    Past Medical History:  Diagnosis Date  . ANXIETY 12/31/2009  . Aortic insufficiency    a. mod by echo 2017.  Marland Kitchen CAD (coronary artery disease)    a. s/p CABGx3 and LAA clipping in 12/2014, NSTEMI 05/2015 s/p DES to native RCA and SVG-dRCA; occluded ramus-SVG was treated medically). c. neg nuc 10/2015 at [facility].  . Chronic diastolic CHF (congestive heart failure) ([place])   . Chronic fatigue   . CKD (chronic kidney disease), stage II   . DEPRESSION 12/31/2009  . [place] DISEASE, CERVICAL 12/31/2009  . [place] DISEASE, LUMBAR 12/31/2009  . DIVERTICULITIS, HX OF 12/31/2009  . Essential hypertension   . Gait abnormality 03/25/2017  . GLUCOSE INTOLERANCE 12/31/2009  . Habitual alcohol use   . History of stroke    self reports " they say i have  had some pin strokes"   . Hypercalcemia   . Hyperkalemia   . Hyperlipidemia 12/31/2009  . Hypothyroidism   . MENOPAUSE, EARLY 12/31/2009  . NSTEMI (non-ST elevated myocardial infarction) ([place]) 05/11/2015  . Orthostatic hypotension   . OSTEOARTHRITIS, HAND    . Pacemaker 01/21/2012   MDT Adapta dual chamber  . Paroxysmal atrial flutter ([place])   . Persistent atrial fibrillation   . PVD (peripheral vascular disease), hx stents to bil SFAs 02/2010   . Rash, skin     superficial raised red pencil point sized rash bilateral forearms; states " it started when i started the Plavix "   . Ruptured left breast implant   . Symptomatic sinus bradycardia 01/22/2012  . Syncope    a. 11/2015 ? due to medications.  . Syncope    reports on 05-10-17 " i passed out 2 months ago in the bathroom and broke some ribs"    . Tachy-brady syndrome ([place])    a. s/p MDT PPM 2013.  . Tricuspid regurgitation     Current Outpatient Medications  Medication Sig Dispense Refill  . alendronate (FOSAMAX) 70 MG tablet Take 70 mg by mouth every Thursday.     Marland Kitchen amLODipine (NORVASC) 5 MG tablet Take 1.5 tablets (7.5 mg total) by mouth daily. 135 tablet 1  . Ascorbic Acid (VITAMIN C PO) Take 1 tablet by mouth daily.    Marland Kitchen aspirin EC 81 MG tablet Take 81 mg by mouth daily.    . citalopram (CELEXA) 20 MG tablet TAKE 1/2 TABLET BY MOUTH EVERY DAY 45 tablet 1  . Coenzyme Q10 (CO Q-10) 100 MG CAPS Take 100 mg by mouth at bedtime.     . dofetilide (TIKOSYN) 250 MCG capsule Take 1 capsule (250 mcg total) by mouth 2 (two) times daily. 180 capsule 3  . furosemide (LASIX) 20 MG tablet Take 20 mg by mouth daily as needed for fluid or edema.     Marland Kitchen levothyroxine (SYNTHROID, LEVOTHROID) 25 MCG tablet Take 25 mcg by mouth daily before breakfast.     . MAGNESIUM CITRATE PO Take 400 mg by mouth daily.    . Multiple Vitamin (MULTIVITAMIN WITH MINERALS) TABS tablet Take 2 tablets by mouth daily.     Marland Kitchen NATTOKINASE PO Take by mouth. Takes 2000 fus daily    . nitroGLYCERIN (NITROSTAT) 0.4 MG SL tablet Place 1 tablet (0.4 mg total) under the tongue every 5 (five) minutes x 3 doses as needed for chest pain. 25 tablet 2  . Omega-3 Fatty Acids (SUPER OMEGA-3) 1000 MG CAPS Take 1,000 mg by mouth at bedtime.      Marland Kitchen OVER THE COUNTER MEDICATION     . Turmeric 500 MG TABS Take 1 tablet by mouth daily.     No current facility-administered medications for this visit.     Allergies  Allergen Reactions  . Brilinta [Ticagrelor] Shortness Of Breath  . Clonidine Derivatives Other (See Comments)    Lowers heart rate  . Atorvastatin Other (See Comments)    Possible cause of fatigue/malaise  . Crestor [Rosuvastatin Calcium] Other (See Comments)    Joint/muscle aches  . Exforge [Amlodipine Besylate-Valsartan] Itching and Rash  . Spironolactone Other (See Comments)    Contraindicated with history of hyperkalemia    Social History   Socioeconomic History  . Marital status: Widowed    Spouse name: Not on file  . Number of children: 3  . Years of education: Not on file  . Highest education level:  Not on file  Occupational History  . 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 Financial resource strain: Not on file  . Food insecurity    Worry: Not on file    Inability: Not on file  . Transportation needs    Medical: Not on file    Non-medical: Not on file  Tobacco Use  . Smoking status: Former Smoker    Packs/day: 0.75    Years: 10.00    Pack years: 7.50    Types: Cigarettes    Quit date: 2008    Years since quitting: 12.5  . Smokeless tobacco: Never Used  . Tobacco comment: 01/21/2012 "quit smoking ~ 2002"  Substance and Sexual Activity  . Alcohol use: Yes    Comment: daily coctail  . Drug use: No  . Sexual activity: Not Currently  Lifestyle  . Physical activity    Days per week: Not on file    Minutes per session: Not on file  . Stress: Not on file  Relationships  . Social Herbalist on phone: Not on file    Gets together: Not on file    Attends religious service: Not on file    Active member of club or organization: Not on file    Attends meetings of clubs or organizations: Not on file    Relationship status: Not on file  . Intimate  partner violence    Fear of current or ex partner: Not on file    Emotionally abused: Not on file    Physically abused: Not on file    Forced sexual activity: Not on file  Other Topics Concern  . Not on file  Social History Narrative   Lives in [place].  Retired.    Review of Systems  Constitutional: Negative.  Negative for chills and fever.  HENT: Negative.  Negative for congestion and sore throat.   Eyes: Negative.   Respiratory: Negative.  Negative for cough and shortness of breath.   Cardiovascular: Negative.  Negative for chest pain and palpitations.  Gastrointestinal: Negative.  Negative for abdominal pain, diarrhea, nausea and vomiting.  Skin: Negative.  Negative for rash.  Neurological: Negative.  Negative for dizziness and headaches.  Psychiatric/Behavioral: Positive for depression. The patient is nervous/anxious.   All other systems reviewed and are negative.   Objective   Vitals as reported by the patient: There were no vitals filed for this visit. Alert and oriented x3 in no apparent respiratory distress  Diagnoses and all orders for this visit:  Severe episode of recurrent major depressive disorder, without psychotic features ([place]) -     Ambulatory referral to Psychiatry  Other orders -     citalopram (CELEXA) 40 MG tablet; Take 1 tablet (40 mg total) by mouth daily.   Telephone time: 15 minutes  Clinically stable.  Continue high-dose Celexa 40 mg in the morning.  Refer to psychiatrist. Office visit after psychiatric evaluation. I discussed the assessment and treatment plan with the patient. The patient was provided an opportunity to ask questions and all were answered. The patient agreed with the plan and demonstrated an understanding of the instructions.   The patient was advised to call back or seek an in-person evaluation if the symptoms worsen or if the condition fails to improve as anticipated.    Horald Pollen, MD  Primary Care at  [facility]

## 2018-09-07 NOTE — Progress Notes (Signed)
Pt is following up with depression. She is taking the celexa, wants to make sure the med she is on is working for the depression. She not necessarily looking to change the medication. She did complete the phq-9 score 21. She did answer 2 to the question of wanting to hurt herself. She is needing a refill on the celexa and amlodipine. Pharmacy has been verified.

## 2018-09-07 NOTE — Telephone Encounter (Signed)
lmtcb to get 8/4 virtual appt changed to a virtual day

## 2018-09-08 ENCOUNTER — Telehealth: Payer: Self-pay

## 2018-09-08 NOTE — Telephone Encounter (Signed)
    COVID-19 Pre-Screening Questions:  . In the past 7 to 10 days have you had a cough,  shortness of breath, headache, congestion, fever (100 or greater) body aches, chills, sore throat, or sudden loss of taste or sense of smell? NO . Have you been around anyone with known Covid 19? NO . Have you been around anyone who is awaiting Covid 19 test results in the past 7 to 10 days? NO . Have you been around anyone who has been exposed to Covid 19, or has mentioned symptoms of Covid 19 within the past 7 to 10 days? NO  If you have any concerns/questions about symptoms patients report during screening (either on the phone or at threshold). Contact the provider seeing the patient or DOD for further guidance.  If neither are available contact a member of the leadership team.   PT APPT CHANGED TO OV ON 8/12 AT 3:45PM. PT AWARE OF MASK POLICY AND GUEST POLICY AND VERBALIZED UNDERSTANDING

## 2018-09-13 ENCOUNTER — Telehealth: Payer: Medicare HMO | Admitting: Cardiovascular Disease

## 2018-09-21 ENCOUNTER — Telehealth: Payer: Self-pay

## 2018-09-21 ENCOUNTER — Ambulatory Visit: Payer: Medicare HMO | Admitting: Cardiovascular Disease

## 2018-09-21 ENCOUNTER — Other Ambulatory Visit: Payer: Self-pay

## 2018-09-21 ENCOUNTER — Encounter: Payer: Self-pay | Admitting: Cardiovascular Disease

## 2018-09-21 VITALS — BP 124/82 | HR 98 | Temp 98.1°F | Ht 64.5 in | Wt 156.2 lb

## 2018-09-21 DIAGNOSIS — I739 Peripheral vascular disease, unspecified: Secondary | ICD-10-CM

## 2018-09-21 DIAGNOSIS — I10 Essential (primary) hypertension: Secondary | ICD-10-CM | POA: Diagnosis not present

## 2018-09-21 DIAGNOSIS — I480 Paroxysmal atrial fibrillation: Secondary | ICD-10-CM

## 2018-09-21 DIAGNOSIS — Z01812 Encounter for preprocedural laboratory examination: Secondary | ICD-10-CM | POA: Diagnosis not present

## 2018-09-21 NOTE — Patient Instructions (Addendum)
[place] [address] Dept: [phone] Loc: [phone]  ZEDA GANGWER  09/21/2018  You are scheduled for a Peripheral Angiogram on Thursday, August 27 with Dr. Quay Burow.  1. Please arrive at the [facility] (Main Entrance A) at [facility]: [address] [place], [place] [address] at 5:30 AM (This time is two hours before your procedure to ensure your preparation). Free valet parking service is available.   Special note: Every effort is made to have your procedure done on time. Please understand that emergencies sometimes delay scheduled procedures.  2. Diet: Do not eat solid foods after midnight.  The patient may have clear liquids until 5am upon the day of the procedure.  3. Labs:   You will need to have blood drawn on October 03, 2018 Go to:  Retail buyer at [employer] [id number], [place], [place] [address] FOR YOUR BASIC METABOLIC PANEL, COMPLETE BLOOD COUNT, AND THYROID STIMULATING HORMONE LAB WORK. NO APPOINTMENT IS NEEDED. YOU MUST HAVE THIS LAB WORK DONE BEFORE GOING TO GET YOUR COVID-19 TEST DONE BECAUSE YOU WILL NEED TO SELF-ISOLATE AFTER THE COVID-19 TEST UNTIL THE DAY OF YOUR PROCEDURE.   You will need to have a COVID-19 test on October 03, 2018. Your appointment is at 11:30am. Go to: [facility] Entrance [place], [place] [address] FOR YOUR COVID-19 TEST. YOU MUST HAVE YOUR COVID-19 TEST COMPLETED 4 DAYS PRIOR TO YOUR UPCOMING PROCEDURE/TEST. YOU WILL ALSO NEED TO SELF-ISOLATE AFTER THE COVID-19 TEST UNTIL THE DAY OF YOUR PROCEDURE.   4. Medication instructions in preparation for your procedure:  Hold your furosemide (Lasix) on the morning of your procedure.  On the morning of your procedure, take your Aspirin and any morning medicines NOT listed above.  You may use sips of  water.   5. Plan for one night stay--bring personal belongings. 6. Bring a current list of your medications and current insurance cards. 7. You MUST have a responsible person to drive you home. 8. Someone MUST be with you the first 24 hours after you arrive home or your discharge will be delayed. 9. Please wear clothes that are easy to get on and off and wear slip-on shoes.  _____________________________________________________________________    Post-ProcedureTesting: Your physician has requested that you have a lower or upper extremity arterial duplex. This test is an ultrasound of the arteries in the legs or arms. It looks at arterial blood flow in the legs and arms. Allow one hour for Lower and Upper Arterial scans. There are no restrictions or special instructions TO BE SCHEDULED FOR THE NEXT AVAILABLE APPOINTMENT AFTER YOUR PROCEDURE. A SCHEDULER WILL CONTACT YOU TO SET UP THIS APPOINTMENT.   Your physician has requested that you have an ankle brachial index (ABI). During this test an ultrasound and blood pressure cuff are used to evaluate the arteries that supply the arms and legs with blood. Allow thirty minutes for this exam. There are no restrictions or special instructions. TO BE SCHEDULED FOR THE NEXT AVAILABLE APPOINTMENT AFTER YOUR PROCEDURE. A SCHEDULER WILL CONTACT YOU TO SET UP THIS APPOINTMENT.   Follow-Up: At [facility], you and your health needs are our priority.  As part of our continuing mission to provide you with exceptional heart care, we have created designated Provider Care Teams.  These Care Teams include your primary Cardiologist (physician) and Advanced Practice Providers (APPs -  Physician Assistants and Nurse Practitioners) who all work together to provide you with the care you need, when you need it. You will need a follow up appointment with Dr. Quay Burow after your procedure.  Please call our office in advance to schedule this appointment.  PLEASE  MAKE SURE TO HAVE YOUR ULTRASOUNDS COMPLETED BEFORE THIS APPOINTMENT.

## 2018-09-21 NOTE — Progress Notes (Signed)
09/21/2018 [name]   1940-07-13  [id number]  Primary Physician Sagardia, Ines Bloomer, MD Primary Cardiologist: Lorretta Harp MD FACP, [place], [place], [place]  HPI:  [name] is a 78 y.o.  moderately overweight engaged Caucasian female mother of 52, grandmother and 6 grandchildren who was referred by Dr. Sallyanne Kuster for peripheral vascular evaluation because otherwise tolerating claudication.  I last saw her in the office 02/10/2017.  She does have a history of PAF having undergone ablation at [facility] a month ago. She is on Tikosyn and Eliquis  however she has not taken her Eliquis several weeks. She has a history of CABG 3 in 2016 and subsequent stent procedures because of early graft failure. She also has PAD status post right left SFA intervention in a staged fashion in 2012 by myself. Over the last year she's had recurrent right lower extreme claudication with Dopplers to suggest a high-frequency signal in her proximal right SFA.  I performed peripheral angiography and intervention on her 02/15/2017 with directional atherectomy and drug-coated balloon angioplasty of her proximal right SFA.  Her right and left SFA stents had 30 to 40% "in-stent restenosis bilaterally and she has three-vessel runoff.  Her post procedure Dopplers performed 03/12/2017 revealed ABIs of 1.14 bilaterally with triphasic waveforms.  Her current Dopplers performed 05/04/2018 revealed a decline in her right ABI to .82  with monophasic waveforms beyond the stented segment suggesting progression of "in-stent restenosis.   Current Meds  Medication Sig  . alendronate (FOSAMAX) 70 MG tablet Take 70 mg by mouth every Thursday.   Marland Kitchen amLODipine (NORVASC) 5 MG tablet Take 1.5 tablets (7.5 mg total) by mouth daily.  . Ascorbic Acid (VITAMIN C PO) Take 1 tablet by mouth daily.  Marland Kitchen aspirin EC 81 MG tablet Take 81 mg by mouth daily.  . citalopram (CELEXA) 40 MG tablet Take 1 tablet (40 mg total) by mouth daily.  . Coenzyme  Q10 (CO Q-10) 100 MG CAPS Take 100 mg by mouth at bedtime.   . dofetilide (TIKOSYN) 250 MCG capsule Take 1 capsule (250 mcg total) by mouth 2 (two) times daily.  . furosemide (LASIX) 20 MG tablet Take 20 mg by mouth daily as needed for fluid or edema.   Marland Kitchen levothyroxine (SYNTHROID, LEVOTHROID) 25 MCG tablet Take 25 mcg by mouth daily before breakfast.   . MAGNESIUM CITRATE PO Take 400 mg by mouth daily.  . Multiple Vitamin (MULTIVITAMIN WITH MINERALS) TABS tablet Take 2 tablets by mouth daily.   Marland Kitchen NATTOKINASE PO Take by mouth. Takes 2000 fus daily  . nitroGLYCERIN (NITROSTAT) 0.4 MG SL tablet Place 1 tablet (0.4 mg total) under the tongue every 5 (five) minutes x 3 doses as needed for chest pain.  . Omega-3 Fatty Acids (SUPER OMEGA-3) 1000 MG CAPS Take 1,000 mg by mouth at bedtime.   Marland Kitchen OVER THE COUNTER MEDICATION   . Turmeric 500 MG TABS Take 1 tablet by mouth daily.     Allergies  Allergen Reactions  . Brilinta [Ticagrelor] Shortness Of Breath  . Clonidine Derivatives Other (See Comments)    Lowers heart rate  . Atorvastatin Other (See Comments)    Possible cause of fatigue/malaise  . Crestor [Rosuvastatin Calcium] Other (See Comments)    Joint/muscle aches  . Exforge [Amlodipine Besylate-Valsartan] Itching and Rash  . Spironolactone Other (See Comments)    Contraindicated with history of hyperkalemia    Social History   Socioeconomic History  . Marital status: Widowed  Spouse name: Not on file  . Number of children: 3  . Years of education: Not on file  . Highest education level: Not on file  Occupational History  . 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 Financial resource strain: Not on file  . Food insecurity    Worry: Not on file    Inability: Not on file  . Transportation needs    Medical: Not on file    Non-medical: Not on file  Tobacco Use  . Smoking status: Former Smoker    Packs/day: 0.75    Years: 10.00    Pack years:  7.50    Types: Cigarettes    Quit date: 2008    Years since quitting: 12.6  . Smokeless tobacco: Never Used  . Tobacco comment: 01/21/2012 "quit smoking ~ 2002"  Substance and Sexual Activity  . Alcohol use: Yes    Comment: daily coctail  . Drug use: No  . Sexual activity: Not Currently  Lifestyle  . Physical activity    Days per week: Not on file    Minutes per session: Not on file  . Stress: Not on file  Relationships  . Social Herbalist on phone: Not on file    Gets together: Not on file    Attends religious service: Not on file    Active member of club or organization: Not on file    Attends meetings of clubs or organizations: Not on file    Relationship status: Not on file  . Intimate partner violence    Fear of current or ex partner: Not on file    Emotionally abused: Not on file    Physically abused: Not on file    Forced sexual activity: Not on file  Other Topics Concern  . Not on file  Social History Narrative   Lives in [place].  Retired.     Review of Systems: General: negative for chills, fever, night sweats or weight changes.  Cardiovascular: negative for chest pain, dyspnea on exertion, edema, orthopnea, palpitations, paroxysmal nocturnal dyspnea or shortness of breath Dermatological: negative for rash Respiratory: negative for cough or wheezing Urologic: negative for hematuria Abdominal: negative for nausea, vomiting, diarrhea, bright red blood per rectum, melena, or hematemesis Neurologic: negative for visual changes, syncope, or dizziness All other systems reviewed and are otherwise negative except as noted above.    Blood pressure 124/82, pulse 98, temperature 98.1 F (36.7 C), temperature source Temporal, height 5' 4.5" (1.638 m), weight 156 lb 3.2 oz (70.9 kg).  General appearance: alert and no distress Neck: no adenopathy, no carotid bruit, no JVD, supple, symmetrical, trachea midline and thyroid not enlarged, symmetric, no  tenderness/mass/nodules Lungs: clear to auscultation bilaterally Heart: regular rate and rhythm, S1, S2 normal, no murmur, click, rub or gallop Extremities: extremities normal, atraumatic, no cyanosis or edema Pulses: 2+ and symmetric Skin: Skin color, texture, turgor normal. No rashes or lesions Neurologic: Alert and oriented X 3, normal strength and tone. Normal symmetric reflexes. Normal coordination and gait  EKG atrially paced rhythm at 98 with poor R wave progression.  I personally reviewed this EKG.  ASSESSMENT AND PLAN:   Claudication in peripheral vascular disease ([place]) History of peripheral arterial disease status post staged bilateral SFA stenting by myself in 2012.  Because of right lower extremity claudication and Doppler suggested a high-frequency signal in her proximal right SFA she underwent re-angiography and intervention 02/15/2017 with directional atherectomy and  drug-coated balloon angioplasty.  She did have 30 to 40% "in-stent restenosis within both mid SFA stents with three-vessel runoff.  Over the last year or so she has had recurrent claudication in her right calf.  Recent Doppler studies performed 05/04/2018 revealed a right ABI of 1.82 and a left of 1.03.  The site of prior intervention on the right side appeared to be patent although she did have moderate disease within the right stent.  Her waveforms were monophasic beyond this.. When compared to her Doppler studies performed 03/12/2017 which were post intervention her ABIs were 1.14 bilaterally with triphasic waveforms.  I suspect that her right SFA stent has progressed and she will need angiography potential re-intervention.      Lorretta Harp MD FACP,FACC,FAHA, [facility] 09/21/2018 4:31 PM

## 2018-09-21 NOTE — Telephone Encounter (Signed)
Left detailed message on pt voicemail per DPR stating that Dr. Gwenlyn Found wanted to speak with her about setting up PV angiogram before she left office. Nurse also requested that patient call back to discuss information on 8/12 AVS regarding PV procedure to be set for 8/20 and also stop by [facility] to pick up her paperwork.

## 2018-09-21 NOTE — Assessment & Plan Note (Signed)
History of peripheral arterial disease status post staged bilateral SFA stenting by myself in 2012.  Because of right lower extremity claudication and Doppler suggested a high-frequency signal in her proximal right SFA she underwent re-angiography and intervention 02/15/2017 with directional atherectomy and drug-coated balloon angioplasty.  She did have 30 to 40% "in-stent restenosis within both mid SFA stents with three-vessel runoff.  Over the last year or so she has had recurrent claudication in her right calf.  Recent Doppler studies performed 05/04/2018 revealed a right ABI of 1.82 and a left of 1.03.  The site of prior intervention on the right side appeared to be patent although she did have moderate disease within the right stent.  Her waveforms were monophasic beyond this.. When compared to her Doppler studies performed 03/12/2017 which were post intervention her ABIs were 1.14 bilaterally with triphasic waveforms.  I suspect that her right SFA stent has progressed and she will need angiography potential re-intervention.

## 2018-09-21 NOTE — Telephone Encounter (Signed)
Glad to help when I return.

## 2018-09-22 ENCOUNTER — Telehealth: Payer: Self-pay | Admitting: *Deleted

## 2018-09-22 NOTE — Telephone Encounter (Signed)
A message was left, re: scheduling her test.

## 2018-09-22 NOTE — Telephone Encounter (Signed)
Received secure chat message from Sheral Apley, RN regarding mychart message sent today by pt. Pt stated the following in mychart message: "I will be out of town on 8/17. Would like to reschedule test the following week." Attempted call to pt. Unable to reach pt. Left detailed message per DPR. Will respond to pt via mychart as well.

## 2018-09-22 NOTE — Telephone Encounter (Signed)
Attempted call. Unable to reach pt. DPR on file. [email or web address] to discuss AVS instructions, confirm procedure date, and stated AVS and lab slip available for pick up at [facility]

## 2018-09-23 ENCOUNTER — Telehealth: Payer: Self-pay

## 2018-09-23 NOTE — Telephone Encounter (Signed)
Called patient at # [phone] about recent email sent to office this afternoon.No answer.[place].

## 2018-09-26 ENCOUNTER — Other Ambulatory Visit (HOSPITAL_COMMUNITY): Payer: Medicare HMO

## 2018-09-26 ENCOUNTER — Encounter: Payer: Self-pay | Admitting: *Deleted

## 2018-09-26 ENCOUNTER — Telehealth: Payer: Self-pay | Admitting: *Deleted

## 2018-09-26 NOTE — Telephone Encounter (Signed)
Will need angiography and potential re-intervention for occluded right SFA and lifestyle limiting claudication.

## 2018-09-26 NOTE — Telephone Encounter (Signed)
Dr berry comments sent to patient via my chart.

## 2018-09-26 NOTE — Telephone Encounter (Signed)
Spoke with pt who states when she left office during last OV with Dr. Gwenlyn Found, she was under the impression that the only recommendations Dr. Gwenlyn Found had for her were lower extremity dopplers. Informed pt that, unfortunately, she left office before receiving AVS and before Dr. Gwenlyn Found was able to tell her that he would like to proceed with PV angio.   Informed pt that Dr. Kennon Holter notes from 8/12 stated "I suspect that her right SFA stent has progressed and she will need angiography potential re-intervention." Pt would like Dr. Gwenlyn Found to confirm.  Pt states she will be out of town this week so she will not be able to do COVID-19 test on 8/20.   Adjusted the following on pt AVS and updated info sent to pt via mychart:   Procedure date 10/06/2018. Arrival time 5:30am  Lab work 10/03/2018  COVID-19 10/03/2018 at 11:30am

## 2018-09-26 NOTE — Telephone Encounter (Signed)
A message was left,re: scheduling her test LEA's and ABI's with a follow up appointment with Dr.Berry.

## 2018-09-28 DIAGNOSIS — R69 Illness, unspecified: Secondary | ICD-10-CM | POA: Diagnosis not present

## 2018-09-29 ENCOUNTER — Other Ambulatory Visit (HOSPITAL_COMMUNITY): Payer: Medicare HMO

## 2018-10-03 ENCOUNTER — Other Ambulatory Visit (HOSPITAL_COMMUNITY)
Admission: RE | Admit: 2018-10-03 | Discharge: 2018-10-03 | Disposition: A | Discharge status: Home / Self Care | Payer: Medicare HMO | Source: Ambulatory Visit | Attending: Cardiovascular Disease | Admitting: Cardiovascular Disease

## 2018-10-03 ENCOUNTER — Other Ambulatory Visit: Payer: Self-pay | Admitting: Emergency Medicine

## 2018-10-03 DIAGNOSIS — Z20828 Contact with and (suspected) exposure to other viral communicable diseases: Secondary | ICD-10-CM | POA: Diagnosis not present

## 2018-10-03 DIAGNOSIS — E039 Hypothyroidism, unspecified: Secondary | ICD-10-CM | POA: Diagnosis not present

## 2018-10-03 DIAGNOSIS — Z01812 Encounter for preprocedural laboratory examination: Secondary | ICD-10-CM | POA: Diagnosis not present

## 2018-10-03 DIAGNOSIS — I739 Peripheral vascular disease, unspecified: Secondary | ICD-10-CM | POA: Diagnosis not present

## 2018-10-03 DIAGNOSIS — I480 Paroxysmal atrial fibrillation: Secondary | ICD-10-CM | POA: Diagnosis not present

## 2018-10-03 LAB — BASIC METABOLIC PANEL
BUN/Creatinine Ratio: 26 (ref 12–28)
BUN: 25 mg/dL (ref 8–27)
CO2: 20 mmol/L (ref 20–29)
Calcium: 9.9 mg/dL (ref 8.7–10.3)
Chloride: 97 mmol/L (ref 96–106)
Creatinine, Ser: 0.95 mg/dL (ref 0.57–1.00)
GFR calc Af Amer: 66 mL/min/{1.73_m2} (ref >59)
GFR calc non Af Amer: 58 mL/min/{1.73_m2} — ABNORMAL LOW (ref >59)
Glucose: 109 mg/dL — ABNORMAL HIGH (ref 65–99)
Potassium: 5 mmol/L (ref 3.5–5.2)
Sodium: 134 mmol/L (ref 134–144)

## 2018-10-03 LAB — CBC
Hematocrit: 45 % (ref 34.0–46.6)
Hemoglobin: 15.2 g/dL (ref 11.1–15.9)
MCH: 30.4 pg (ref 26.6–33.0)
MCHC: 33.8 g/dL (ref 31.5–35.7)
MCV: 90 fL (ref 79–97)
Platelets: 249 10*3/uL (ref 150–450)
RBC: 5 x10E6/uL (ref 3.77–5.28)
RDW: 12 % (ref 11.7–15.4)
WBC: 6.6 10*3/uL (ref 3.4–10.8)

## 2018-10-03 LAB — TSH: TSH: 1.02 u[IU]/mL (ref 0.450–4.500)

## 2018-10-03 LAB — SARS CORONAVIRUS 2 (TAT 6-24 HRS): SARS Coronavirus 2: NEGATIVE

## 2018-10-03 MED ORDER — CITALOPRAM HYDROBROMIDE 40 MG PO TABS: 40.0000 mg | ORAL_TABLET | Freq: Every day | ORAL | 3 refills | Status: DC

## 2018-10-03 NOTE — Telephone Encounter (Signed)
Requested medication (s) are due for refill today: yes Requested medication (s) are on the active medication list: yes  Last refill: 09/07/2018  Future visit scheduled: no  Notes to clinic:REQUEST FOR 90 DAYS PRESCRIPTION     Requested Prescriptions  Pending Prescriptions Disp Refills   citalopram (CELEXA) 40 MG tablet [Pharmacy Med Name: CITALOPRAM HBR 40 MG TABLET] 90 tablet 2    Sig: TAKE 1 Luna     Psychiatry:  Antidepressants - SSRI Failed - 10/03/2018  9:30 AM      Failed - Valid encounter within last 6 months    Recent Outpatient Visits          6 months ago Cough   Primary Care at [facility], Ines Bloomer, MD   10 months ago Breast lump   Primary Care at [facility], Ines Bloomer, MD   1 year ago Left breast mass   [facility] at [facility], Lilia Argue, MD   1 year ago Rib pain on left side   Primary Care at [facility], Ines Bloomer, MD   1 year ago Urinary frequency   [facility] at [facility], Lilia Argue, MD             Passed - Completed PHQ-2 or PHQ-9 in the last 360 days.

## 2018-10-03 NOTE — Telephone Encounter (Signed)
Refill x 90 days sent.

## 2018-10-05 ENCOUNTER — Telehealth: Payer: Self-pay | Admitting: *Deleted

## 2018-10-05 ENCOUNTER — Other Ambulatory Visit: Payer: Self-pay

## 2018-10-05 NOTE — Telephone Encounter (Signed)
Pt contacted pre-abdominal aortogram scheduled at [facility] for: Thursday October 06, 2018 7:30AM Verified arrival time and place: [facility]) at: 5:30 AM   No solid food after midnight prior to cath, clear liquids until 5 AM day of procedure. Contrast allergy: no  Hold: Lasix-AM of procedure  Except hold medications AM meds can be  taken pre-cath with sip of water including: ASA 81 mg   Confirmed patient has responsible person to drive home post procedure and observe 24 hours after arriving home: yes  Currently, due to Covid-19 pandemic, only one support person will be allowed with patient. Must be the same support person for that patient's entire stay, will be screened and required to wear a mask. They will be asked to wait in the waiting room for the duration of the patient's stay.  Patients are required to wear a mask when they enter the hospital.      COVID-19 Pre-Screening Questions:  . In the past 7 to 10 days have you had a cough,  shortness of breath, headache, congestion, fever (100 or greater) body aches, chills, sore throat, or sudden loss of taste or sense of smell? no . Have you been around anyone with known Covid 19? no . Have you been around anyone who is awaiting Covid 19 test results in the past 7 to 10 days? no . Have you been around anyone who has been exposed to Covid 19, or has mentioned symptoms of Covid 19 within the past 7 to 10 days? no  I reviewed procedure/mask/visitor, Covid-19 screening questions with patient, she verbalized understanding, thanked me for call.

## 2018-10-06 ENCOUNTER — Ambulatory Visit (HOSPITAL_COMMUNITY)
Admission: RE | Admit: 2018-10-06 | Discharge: 2018-10-07 | Disposition: A | Discharge status: Home / Self Care | Payer: Medicare HMO | Attending: Cardiovascular Disease | Admitting: Cardiovascular Disease

## 2018-10-06 ENCOUNTER — Encounter (HOSPITAL_COMMUNITY)
Admission: RE | Disposition: A | Discharge status: Home / Self Care | Payer: Self-pay | Source: Home / Self Care | Attending: Cardiovascular Disease

## 2018-10-06 ENCOUNTER — Other Ambulatory Visit: Payer: Self-pay

## 2018-10-06 DIAGNOSIS — Z95820 Peripheral vascular angioplasty status with implants and grafts: Secondary | ICD-10-CM | POA: Insufficient documentation

## 2018-10-06 DIAGNOSIS — E663 Overweight: Secondary | ICD-10-CM | POA: Diagnosis not present

## 2018-10-06 DIAGNOSIS — Z7983 Long term (current) use of bisphosphonates: Secondary | ICD-10-CM | POA: Diagnosis not present

## 2018-10-06 DIAGNOSIS — Z888 Allergy status to other drugs, medicaments and biological substances status: Secondary | ICD-10-CM | POA: Diagnosis not present

## 2018-10-06 DIAGNOSIS — Y831 Surgical operation with implant of artificial internal device as the cause of abnormal reaction of the patient, or of later complication, without mention of misadventure at the time of the procedure: Secondary | ICD-10-CM | POA: Insufficient documentation

## 2018-10-06 DIAGNOSIS — I739 Peripheral vascular disease, unspecified: Secondary | ICD-10-CM | POA: Diagnosis present

## 2018-10-06 DIAGNOSIS — T82856A Stenosis of peripheral vascular stent, initial encounter: Secondary | ICD-10-CM | POA: Diagnosis not present

## 2018-10-06 DIAGNOSIS — Z955 Presence of coronary angioplasty implant and graft: Secondary | ICD-10-CM | POA: Insufficient documentation

## 2018-10-06 DIAGNOSIS — Z7982 Long term (current) use of aspirin: Secondary | ICD-10-CM | POA: Insufficient documentation

## 2018-10-06 DIAGNOSIS — Z79890 Hormone replacement therapy (postmenopausal): Secondary | ICD-10-CM | POA: Diagnosis not present

## 2018-10-06 DIAGNOSIS — I70211 Atherosclerosis of native arteries of extremities with intermittent claudication, right leg: Secondary | ICD-10-CM | POA: Diagnosis not present

## 2018-10-06 DIAGNOSIS — Z79899 Other long term (current) drug therapy: Secondary | ICD-10-CM | POA: Diagnosis not present

## 2018-10-06 DIAGNOSIS — Z951 Presence of aortocoronary bypass graft: Secondary | ICD-10-CM | POA: Insufficient documentation

## 2018-10-06 HISTORY — PX: PERIPHERAL VASCULAR INTERVENTION: CATH118257

## 2018-10-06 HISTORY — PX: ABDOMINAL AORTOGRAM W/LOWER EXTREMITY: CATH118223

## 2018-10-06 LAB — POCT ACTIVATED CLOTTING TIME
Activated Clotting Time: 263 seconds
Activated Clotting Time: 263 seconds
Activated Clotting Time: 235 seconds
Activated Clotting Time: 191 seconds
Activated Clotting Time: 169 seconds

## 2018-10-06 SURGERY — ABDOMINAL AORTOGRAM W/LOWER EXTREMITY
Anesthesia: LOCAL | Laterality: Right

## 2018-10-06 MED ORDER — AMLODIPINE BESYLATE 5 MG PO TABS
7.5000 mg | ORAL_TABLET | Freq: Every day | ORAL | Status: DC
  Administered 2018-10-06: 7.5 mg via ORAL
  Filled 2018-10-06 (×2): qty 1

## 2018-10-06 MED ORDER — SODIUM CHLORIDE 0.9 % WEIGHT BASED INFUSION
3.0000 mL/kg/h | INTRAVENOUS | Status: DC
  Administered 2018-10-06: 3 mL/kg/h via INTRAVENOUS

## 2018-10-06 MED ORDER — SODIUM CHLORIDE 0.9% FLUSH: 3.0000 mL | INTRAVENOUS | Status: DC | PRN

## 2018-10-06 MED ORDER — HEPARIN (PORCINE) IN NACL 1000-0.9 UT/500ML-% IV SOLN
INTRAVENOUS | Status: AC
  Filled 2018-10-06: qty 1000

## 2018-10-06 MED ORDER — IODIXANOL 320 MG/ML IV SOLN
INTRAVENOUS | Status: DC | PRN
  Administered 2018-10-06: 190 mL via INTRA_ARTERIAL

## 2018-10-06 MED ORDER — ASPIRIN EC 81 MG PO TBEC
81.0000 mg | DELAYED_RELEASE_TABLET | Freq: Every day | ORAL | Status: DC
  Administered 2018-10-07: 81 mg via ORAL
  Filled 2018-10-06: qty 1

## 2018-10-06 MED ORDER — SODIUM CHLORIDE 0.9 % WEIGHT BASED INFUSION: 1.0000 mL/kg/h | INTRAVENOUS | Status: DC

## 2018-10-06 MED ORDER — CLOPIDOGREL BISULFATE 75 MG PO TABS
75.0000 mg | ORAL_TABLET | Freq: Every day | ORAL | Status: DC
  Administered 2018-10-07: 75 mg via ORAL
  Filled 2018-10-06: qty 1

## 2018-10-06 MED ORDER — ZOLPIDEM TARTRATE 5 MG PO TABS: 5.0000 mg | ORAL_TABLET | Freq: Every evening | ORAL | Status: DC | PRN

## 2018-10-06 MED ORDER — ACETAMINOPHEN 325 MG PO TABS
650.0000 mg | ORAL_TABLET | ORAL | Status: DC | PRN
  Administered 2018-10-06: 650 mg via ORAL
  Filled 2018-10-06: qty 2

## 2018-10-06 MED ORDER — SODIUM CHLORIDE 0.9 % IV SOLN: 250.0000 mL | INTRAVENOUS | Status: DC | PRN

## 2018-10-06 MED ORDER — TRAMADOL HCL 50 MG PO TABS
50.0000 mg | ORAL_TABLET | Freq: Four times a day (QID) | ORAL | Status: DC | PRN
  Administered 2018-10-06 – 2018-10-07 (×3): 50 mg via ORAL
  Filled 2018-10-06 (×3): qty 1

## 2018-10-06 MED ORDER — FUROSEMIDE 40 MG PO TABS: 40.0000 mg | ORAL_TABLET | Freq: Every day | ORAL | Status: DC | PRN

## 2018-10-06 MED ORDER — HEPARIN (PORCINE) IN NACL 1000-0.9 UT/500ML-% IV SOLN
INTRAVENOUS | Status: DC | PRN
  Administered 2018-10-06 (×2): 500 mL

## 2018-10-06 MED ORDER — CLOPIDOGREL BISULFATE 300 MG PO TABS
ORAL_TABLET | ORAL | Status: AC
  Filled 2018-10-06: qty 1

## 2018-10-06 MED ORDER — ONDANSETRON HCL 4 MG/2ML IJ SOLN: 4.0000 mg | Freq: Four times a day (QID) | INTRAMUSCULAR | Status: DC | PRN

## 2018-10-06 MED ORDER — HEPARIN SODIUM (PORCINE) 1000 UNIT/ML IJ SOLN
INTRAMUSCULAR | Status: AC
  Filled 2018-10-06: qty 1

## 2018-10-06 MED ORDER — LIDOCAINE HCL (PF) 1 % IJ SOLN
INTRAMUSCULAR | Status: AC
  Filled 2018-10-06: qty 30

## 2018-10-06 MED ORDER — LEVOTHYROXINE SODIUM 25 MCG PO TABS
25.0000 ug | ORAL_TABLET | Freq: Every day | ORAL | Status: DC
  Administered 2018-10-07: 25 ug via ORAL
  Filled 2018-10-06: qty 1

## 2018-10-06 MED ORDER — NITROGLYCERIN 0.4 MG SL SUBL: 0.4000 mg | SUBLINGUAL_TABLET | SUBLINGUAL | Status: DC | PRN

## 2018-10-06 MED ORDER — SODIUM CHLORIDE 0.9% FLUSH
3.0000 mL | Freq: Two times a day (BID) | INTRAVENOUS | Status: DC
  Administered 2018-10-07: 3 mL via INTRAVENOUS

## 2018-10-06 MED ORDER — FENTANYL CITRATE (PF) 100 MCG/2ML IJ SOLN
INTRAMUSCULAR | Status: DC | PRN
  Administered 2018-10-06: 25 ug via INTRAVENOUS

## 2018-10-06 MED ORDER — SODIUM CHLORIDE 0.9% FLUSH
3.0000 mL | Freq: Two times a day (BID) | INTRAVENOUS | Status: DC
  Administered 2018-10-06: 3 mL via INTRAVENOUS

## 2018-10-06 MED ORDER — MIDAZOLAM HCL 2 MG/2ML IJ SOLN
INTRAMUSCULAR | Status: AC
  Filled 2018-10-06: qty 2

## 2018-10-06 MED ORDER — DOFETILIDE 250 MCG PO CAPS
250.0000 ug | ORAL_CAPSULE | Freq: Two times a day (BID) | ORAL | Status: DC
  Administered 2018-10-06 – 2018-10-07 (×3): 250 ug via ORAL
  Filled 2018-10-06 (×4): qty 1

## 2018-10-06 MED ORDER — LIDOCAINE HCL (PF) 1 % IJ SOLN
INTRAMUSCULAR | Status: DC | PRN
  Administered 2018-10-06: 30 mL via INTRADERMAL

## 2018-10-06 MED ORDER — MIDAZOLAM HCL 2 MG/2ML IJ SOLN
INTRAMUSCULAR | Status: DC | PRN
  Administered 2018-10-06: 1 mg via INTRAVENOUS

## 2018-10-06 MED ORDER — MORPHINE SULFATE (PF) 10 MG/ML IV SOLN: 2.0000 mg | INTRAVENOUS | Status: DC | PRN

## 2018-10-06 MED ORDER — MORPHINE SULFATE (PF) 2 MG/ML IV SOLN
2.0000 mg | INTRAVENOUS | Status: DC | PRN
  Administered 2018-10-06: 2 mg via INTRAVENOUS
  Filled 2018-10-06: qty 1

## 2018-10-06 MED ORDER — ASPIRIN EC 81 MG PO TBEC: 81.0000 mg | DELAYED_RELEASE_TABLET | Freq: Every day | ORAL | Status: DC

## 2018-10-06 MED ORDER — ASPIRIN 81 MG PO CHEW: 81.0000 mg | CHEWABLE_TABLET | ORAL | Status: DC

## 2018-10-06 MED ORDER — HEPARIN SODIUM (PORCINE) 1000 UNIT/ML IJ SOLN
INTRAMUSCULAR | Status: DC | PRN
  Administered 2018-10-06: 3000 [IU] via INTRAVENOUS
  Administered 2018-10-06: 7500 [IU] via INTRAVENOUS

## 2018-10-06 MED ORDER — HYDRALAZINE HCL 20 MG/ML IJ SOLN: 5.0000 mg | INTRAMUSCULAR | Status: DC | PRN

## 2018-10-06 MED ORDER — SODIUM CHLORIDE 0.9 % IV SOLN: INTRAVENOUS | Status: DC

## 2018-10-06 MED ORDER — FENTANYL CITRATE (PF) 100 MCG/2ML IJ SOLN
INTRAMUSCULAR | Status: AC
  Filled 2018-10-06: qty 2

## 2018-10-06 MED ORDER — CLOPIDOGREL BISULFATE 300 MG PO TABS
ORAL_TABLET | ORAL | Status: DC | PRN
  Administered 2018-10-06: 300 mg via ORAL

## 2018-10-06 MED ORDER — MORPHINE SULFATE (PF) 2 MG/ML IV SOLN: 1.0000 mg | INTRAVENOUS | Status: DC | PRN

## 2018-10-06 MED ORDER — LABETALOL HCL 5 MG/ML IV SOLN: 10.0000 mg | INTRAVENOUS | Status: DC | PRN

## 2018-10-06 MED ORDER — CITALOPRAM HYDROBROMIDE 20 MG PO TABS
40.0000 mg | ORAL_TABLET | Freq: Every day | ORAL | Status: DC
  Administered 2018-10-06 – 2018-10-07 (×2): 40 mg via ORAL
  Filled 2018-10-06 (×2): qty 2
  Filled 2018-10-06 (×2): qty 1

## 2018-10-06 SURGICAL SUPPLY — 25 items
BALLN CHOCOLATE 4.0X120X135 (BALLOONS) ×3
BALLN STERLING OTW 5X150X150 (BALLOONS) ×3
BALLOON CHOCOLATE 4.0X120X135 (BALLOONS) IMPLANT
BALLOON STERLING OTW 5X150X150 (BALLOONS) IMPLANT
CATH ANGIO 5F PIGTAIL 65CM (CATHETERS) ×1 IMPLANT
CATH CROSS OVER TEMPO 5F (CATHETERS) ×1 IMPLANT
CATH NAVICROSS ANGLED 135CM (MICROCATHETER) ×1 IMPLANT
DEVICE CONTINUOUS FLUSH (MISCELLANEOUS) ×1 IMPLANT
GUIDEWIRE ANGLED .035X260CM (WIRE) ×1 IMPLANT
KIT ENCORE 26 ADVANTAGE (KITS) ×1 IMPLANT
KIT PV (KITS) ×3 IMPLANT
SHEATH HIGHFLEX ANSEL 7FR 55CM (SHEATH) ×1 IMPLANT
SHEATH PINNACLE 5F 10CM (SHEATH) ×1 IMPLANT
SHEATH PINNACLE 7F 10CM (SHEATH) ×1 IMPLANT
SHEATH PROBE COVER 6X72 (BAG) ×1 IMPLANT
STENT ELUVIA 6X120X130 (Permanent Stent) ×2 IMPLANT
STOPCOCK MORSE 400PSI 3WAY (MISCELLANEOUS) ×1 IMPLANT
SYR MEDRAD MARK 7 150ML (SYRINGE) ×3 IMPLANT
TAPE VIPERTRACK RADIOPAQ (MISCELLANEOUS) IMPLANT
TAPE VIPERTRACK RADIOPAQUE (MISCELLANEOUS) ×1
TRANSDUCER W/STOPCOCK (MISCELLANEOUS) ×3 IMPLANT
TRAY PV CATH (CUSTOM PROCEDURE TRAY) ×3 IMPLANT
TUBING CIL FLEX 10 FLL-RA (TUBING) ×1 IMPLANT
WIRE HITORQ VERSACORE ST 145CM (WIRE) ×1 IMPLANT
WIRE SPARTACORE .014X300CM (WIRE) ×1 IMPLANT

## 2018-10-06 NOTE — Progress Notes (Signed)
    7 Fr. Sheath was pulled from L F/A, and manual pressure was held for 30 min. Sterile gauze was applied at the site. L groin is soft and non tender.     Bed rest started at 1230 X 6 hr. Patient was given instructions about the bed rest.  L and R DP and PT were obtained with doppler.  HR 75 AF BP 135/79 sPO2 99% on R/A

## 2018-10-06 NOTE — Progress Notes (Signed)
Large loose brown BM in bedpan.

## 2018-10-07 ENCOUNTER — Encounter (HOSPITAL_COMMUNITY): Payer: Self-pay | Admitting: Cardiovascular Disease

## 2018-10-07 DIAGNOSIS — I739 Peripheral vascular disease, unspecified: Secondary | ICD-10-CM

## 2018-10-07 DIAGNOSIS — T82856A Stenosis of peripheral vascular stent, initial encounter: Secondary | ICD-10-CM | POA: Diagnosis not present

## 2018-10-07 LAB — BASIC METABOLIC PANEL
Anion gap: 8 (ref 5–15)
BUN: 14 mg/dL (ref 8–23)
CO2: 25 mmol/L (ref 22–32)
Calcium: 8.6 mg/dL — ABNORMAL LOW (ref 8.9–10.3)
Chloride: 102 mmol/L (ref 98–111)
Creatinine, Ser: 0.83 mg/dL (ref 0.44–1.00)
GFR calc Af Amer: >60 mL/min (ref >60)
GFR calc non Af Amer: >60 mL/min (ref >60)
Glucose, Bld: 110 mg/dL — ABNORMAL HIGH (ref 70–99)
Potassium: 4.6 mmol/L (ref 3.5–5.1)
Sodium: 135 mmol/L (ref 135–145)

## 2018-10-07 LAB — CBC
HCT: 40.1 % (ref 36.0–46.0)
Hemoglobin: 13.3 g/dL (ref 12.0–15.0)
MCH: 30.8 pg (ref 26.0–34.0)
MCHC: 33.2 g/dL (ref 30.0–36.0)
MCV: 92.8 fL (ref 80.0–100.0)
Platelets: 232 10*3/uL (ref 150–400)
RBC: 4.32 MIL/uL (ref 3.87–5.11)
RDW: 12.3 % (ref 11.5–15.5)
WBC: 8.4 10*3/uL (ref 4.0–10.5)
nRBC: 0 % (ref 0.0–0.2)

## 2018-10-07 MED ORDER — AMLODIPINE BESYLATE 5 MG PO TABS
7.5000 mg | ORAL_TABLET | Freq: Every day | ORAL | Status: DC
  Administered 2018-10-07: 7.5 mg via ORAL
  Filled 2018-10-07: qty 2

## 2018-10-07 MED ORDER — CLOPIDOGREL BISULFATE 75 MG PO TABS: 75.0000 mg | ORAL_TABLET | Freq: Every day | ORAL | 3 refills | Status: DC

## 2018-10-07 NOTE — Discharge Summary (Addendum)
Discharge Summary    Patient ID: [name] MRN: [id number]; DOB: 06-Aug-1940  Admit date: 10/06/2018 Discharge date: 10/07/2018  Primary Care Provider: Horald Pollen, MD  Primary Cardiologist: Sanda Klein, MD   Discharge Diagnoses    Active Problems:   Claudication in peripheral vascular disease ([place])  Allergies Allergies  Allergen Reactions  . Brilinta [Ticagrelor] Shortness Of Breath  . Clonidine Derivatives Other (See Comments)    Lowers heart rate  . Atorvastatin Other (See Comments)    Possible cause of fatigue/malaise  . Crestor [Rosuvastatin Calcium] Other (See Comments)    Joint/muscle aches  . Exforge [Amlodipine Besylate-Valsartan] Itching and Rash  . Spironolactone Other (See Comments)    Contraindicated with history of hyperkalemia   Diagnostic Studies/Procedures    Abdominal Aortogram with lower extremity peripheral vascular intervention 10/06/18  Angiographic Data:   1: Abdominal aorta- mildly atherosclerotic but widely patent 2: Left lower extremity- 30 to 40% proximal left common iliac artery stenosis, 80% proximal left SFA stenosis, widely patent mid left SFA stent, 40 to 50% segmental mid to distal left SFA beyond the stented segment with three-vessel runoff. 3: Right lower extremity- occluded mid right SFA stent beginning proximal to the stented segment reconstituting in the distal portion of the stent with three-vessel runoff  IMPRESSION: Ms. [name] has an occluded right SFA stent reconstituting in the distal portion of the stent with the occlusion beginning proximal to the stented segment.  She has three-vessel runoff.  We will proceed with recanalization, PTA and drug coated self-expanding stenting.  History of Present Illness     [name] a 78yo F who was referred by Dr. Sallyanne Kuster to Dr. Gwenlyn Found for peripheral vascular evaluation for lower extremity claudication.She was seen by Dr. Gwenlyn Found 02/10/2017 and was noted to have a history of  PAF s/p ablation at [facility] and is on Tikosyn andEliquis. She was noted to have temporarily self-discontinued her Eliquisbut this has been resumed. She has a history of CABG 3 in 2016 and subsequent stent procedures because of early graft failure. She also has PAD status post right left SFA intervention in a staged fashion in 2012. Over the last year she reported recurrent right lower extremity claudication with Dopplers to suggest a high-frequency signal in her proximal right SFA. Given this plan was for PV procedure with Dr. Gwenlyn Found scheduled for 10/06/2018.   Hospital Course     On 10/06/2018 [name] underwent a successful PTA and drug-coated self-expanding stenting of a right SFA CTO involving a previously placed nitinol stent back in 2012 with lifestyle limiting claudication. She had an excellent result per procedure report. Follow up for lower extremity arterial Doppler studies in the [facility] line office have been scheduled along with a close follow up with Dr. Gwenlyn Found thereafter.   AM lab work is stable with a creatinine of 0.83 and Hb of 13.3.   Consultants: None   The patient was seen an examined by Dr. Ellyn Hack who feels that she is stable and ready for discharge today 087/28/2020.  _____________  Discharge Vitals Blood pressure (!) 110/57, pulse 75, temperature 97.7 F (36.5 C), resp. rate 17, height 5' 4.5" (1.638 m), weight 68 kg, SpO2 95 %.  Filed Weights   10/06/18 0551  Weight: 68 kg  General appearance: alert, cooperative, appears stated age and no distress Neck: no JVD Lungs: clear to auscultation bilaterally and normal percussion bilaterally Heart: regular rate and rhythm, S1, S2 normal, no murmur, click, rub or gallop  and normal apical impulse Abdomen: soft, non-tender; bowel sounds normal; no masses,  no organomegaly Extremities: extremities normal, atraumatic, no cyanosis or edema Pulses: 1+ LLE, 2+ RLE pedal pulses.  Soft L femoral bruit; R groin site no  ecchymoses or hematomoa Neurologic: Grossly normal    Labs & Radiologic Studies    CBC Recent Labs    10/07/18 0338  WBC 8.4  HGB 13.3  HCT 40.1  MCV 92.8  PLT [id number]   Basic Metabolic Panel Recent Labs    10/07/18 0338  NA 135  K 4.6  CL 102  CO2 25  GLUCOSE 110*  BUN 14  CREATININE 0.83  CALCIUM 8.6*   _____________  No results found. Disposition   Pt is being discharged home today in good condition.  Follow-up Plans & Appointments    Follow-up Information    [facility] Follow up on 10/18/2018.   Specialty: Cardiology Why: Your follow up doppler studies are scheduled for 10/18/2018 at [place] information: [address] [place] [phone]       Lorretta Harp, MD Follow up on 11/02/2018.   Specialties: Cardiology, Radiology Why: Your follow up with Dr. Gwenlyn Found is on 11/02/2018 at 1045am Contact information: [address] [place] [address] [phone]          Discharge Instructions    Call MD for:  difficulty breathing, headache or visual disturbances   Complete by: As directed    Call MD for:  extreme fatigue   Complete by: As directed    Call MD for:  hives   Complete by: As directed    Call MD for:  persistant dizziness or light-headedness   Complete by: As directed    Call MD for:  persistant nausea and vomiting   Complete by: As directed    Call MD for:  redness, tenderness, or signs of infection (pain, swelling, redness, odor or green/yellow discharge around incision site)   Complete by: As directed    Call MD for:  severe uncontrolled pain   Complete by: As directed    Call MD for:  temperature >100.4   Complete by: As directed    Diet - low sodium heart healthy   Complete by: As directed    Increase activity slowly   Complete by: As directed      Discharge Medications   Allergies as of 10/07/2018      Reactions   Brilinta [ticagrelor] Shortness Of  Breath   Clonidine Derivatives Other (See Comments)   Lowers heart rate   Atorvastatin Other (See Comments)   Possible cause of fatigue/malaise   Crestor [rosuvastatin Calcium] Other (See Comments)   Joint/muscle aches   Exforge [amlodipine Besylate-valsartan] Itching, Rash   Spironolactone Other (See Comments)   Contraindicated with history of hyperkalemia      Medication List    TAKE these medications   alendronate 70 MG tablet Commonly known as: FOSAMAX Take 70 mg by mouth every Saturday.   amLODipine 5 MG tablet Commonly known as: NORVASC Take 1.5 tablets (7.5 mg total) by mouth daily. What changed:   how much to take  when to take this  additional instructions   aspirin EC 81 MG tablet Take 81 mg by mouth daily.   citalopram 40 MG tablet Commonly known as: CELEXA Take 1 tablet (40 mg total) by mouth daily.   clopidogrel 75 MG tablet Commonly known as: PLAVIX Take 1 tablet (75 mg total)  by mouth daily with breakfast. Start taking on: October 08, 2018   Co Q-10 100 MG Caps Take 100 mg by mouth at bedtime.   docusate sodium 100 MG capsule Commonly known as: COLACE Take 100 mg by mouth daily as needed for mild constipation.   dofetilide 250 MCG capsule Commonly known as: TIKOSYN Take 1 capsule (250 mcg total) by mouth 2 (two) times daily.   fexofenadine-pseudoephedrine 180-240 MG 24 hr tablet Commonly known as: ALLEGRA-D 24 Take 1 tablet by mouth daily as needed (allergies).   furosemide 40 MG tablet Commonly known as: LASIX Take 40 mg by mouth daily as needed for fluid or edema.   levothyroxine 25 MCG tablet Commonly known as: SYNTHROID Take 25 mcg by mouth daily before breakfast.   MAGNESIUM CITRATE PO Take 420 mg by mouth daily.   Multi-Vite Liqd Take 1 Dose by mouth daily.   NATTOKINASE PO Take 1 tablet by mouth daily. "2000 fus"   nitroGLYCERIN 0.4 MG SL tablet Commonly known as: NITROSTAT Place 1 tablet (0.4 mg total) under the tongue  every 5 (five) minutes x 3 doses as needed for chest pain.   OMEGA 3 PO Take 690 mg by mouth daily.   TURMERIC PO Take 475 mg by mouth daily.   vitamin C 500 MG tablet Commonly known as: ASCORBIC ACID Take 500 mg by mouth daily.        Acute coronary syndrome (MI, NSTEMI, STEMI, etc) this admission?: No.    Outstanding Labs/Studies   None   Duration of Discharge Encounter   Greater than 30 minutes including physician time.  Signed, Kathyrn Drown, NP 10/07/2018, 11:56 AM  ATTENDING ATTESTATION  I have seen, examined and evaluated the patient this AM along with Kathyrn Drown, NP-C.  After reviewing all the available data and chart, we discussed the patients laboratory, study & physical findings as well as symptoms in detail. I agree with her findings, examination as well as impression & d/c summary as per our discussion.    Attending adjustments noted in italics.   OK for d/c s/p RSFA PCI    Glenetta Hew, M.D., M.S. Interventional Cardiologist   Pager # [phone] Phone # [phone] [address] [place], [place] [address]

## 2018-10-11 DIAGNOSIS — R69 Illness, unspecified: Secondary | ICD-10-CM | POA: Diagnosis not present

## 2018-10-18 ENCOUNTER — Ambulatory Visit (HOSPITAL_COMMUNITY)
Admission: RE | Admit: 2018-10-18 | Discharge: 2018-10-18 | Disposition: A | Discharge status: Home / Self Care | Payer: Medicare HMO | Source: Ambulatory Visit | Attending: Cardiovascular Disease | Admitting: Cardiovascular Disease

## 2018-10-18 ENCOUNTER — Other Ambulatory Visit (HOSPITAL_COMMUNITY): Payer: Self-pay | Admitting: Cardiovascular Disease

## 2018-10-18 ENCOUNTER — Other Ambulatory Visit: Payer: Self-pay

## 2018-10-18 DIAGNOSIS — I739 Peripheral vascular disease, unspecified: Secondary | ICD-10-CM

## 2018-10-20 DIAGNOSIS — R69 Illness, unspecified: Secondary | ICD-10-CM | POA: Diagnosis not present

## 2018-10-27 DIAGNOSIS — R69 Illness, unspecified: Secondary | ICD-10-CM | POA: Diagnosis not present

## 2018-11-01 ENCOUNTER — Other Ambulatory Visit: Payer: Self-pay | Admitting: *Deleted

## 2018-11-01 DIAGNOSIS — I739 Peripheral vascular disease, unspecified: Secondary | ICD-10-CM

## 2018-11-02 ENCOUNTER — Ambulatory Visit: Payer: Medicare HMO | Admitting: Cardiovascular Disease

## 2018-11-30 DIAGNOSIS — R69 Illness, unspecified: Secondary | ICD-10-CM | POA: Diagnosis not present

## 2018-12-03 ENCOUNTER — Other Ambulatory Visit: Payer: Self-pay | Admitting: Cardiovascular Disease

## 2018-12-05 ENCOUNTER — Encounter: Payer: Medicare HMO | Admitting: Cardiovascular Disease

## 2018-12-26 ENCOUNTER — Encounter: Payer: Self-pay | Admitting: Cardiovascular Disease

## 2018-12-26 ENCOUNTER — Ambulatory Visit (INDEPENDENT_AMBULATORY_CARE_PROVIDER_SITE_OTHER): Payer: Medicare HMO | Admitting: Cardiovascular Disease

## 2018-12-26 ENCOUNTER — Other Ambulatory Visit: Payer: Self-pay

## 2018-12-26 VITALS — BP 110/62 | HR 83 | Temp 97.2°F | Ht 64.5 in | Wt 155.0 lb

## 2018-12-26 DIAGNOSIS — E785 Hyperlipidemia, unspecified: Secondary | ICD-10-CM

## 2018-12-26 DIAGNOSIS — Z5181 Encounter for therapeutic drug level monitoring: Secondary | ICD-10-CM

## 2018-12-26 DIAGNOSIS — I6523 Occlusion and stenosis of bilateral carotid arteries: Secondary | ICD-10-CM

## 2018-12-26 DIAGNOSIS — Z950 Presence of cardiac pacemaker: Secondary | ICD-10-CM | POA: Diagnosis not present

## 2018-12-26 DIAGNOSIS — I25708 Atherosclerosis of coronary artery bypass graft(s), unspecified, with other forms of angina pectoris: Secondary | ICD-10-CM | POA: Diagnosis not present

## 2018-12-26 DIAGNOSIS — Z79899 Other long term (current) drug therapy: Secondary | ICD-10-CM

## 2018-12-26 DIAGNOSIS — I480 Paroxysmal atrial fibrillation: Secondary | ICD-10-CM

## 2018-12-26 DIAGNOSIS — I5032 Chronic diastolic (congestive) heart failure: Secondary | ICD-10-CM

## 2018-12-26 DIAGNOSIS — D351 Benign neoplasm of parathyroid gland: Secondary | ICD-10-CM | POA: Diagnosis not present

## 2018-12-26 DIAGNOSIS — I739 Peripheral vascular disease, unspecified: Secondary | ICD-10-CM

## 2018-12-26 DIAGNOSIS — I10 Essential (primary) hypertension: Secondary | ICD-10-CM

## 2018-12-26 DIAGNOSIS — E7800 Pure hypercholesterolemia, unspecified: Secondary | ICD-10-CM

## 2018-12-26 LAB — COMPREHENSIVE METABOLIC PANEL
ALT: 13 IU/L (ref 0–32)
AST: 22 IU/L (ref 0–40)
Albumin/Globulin Ratio: 1.5 (ref 1.2–2.2)
Albumin: 4.4 g/dL (ref 3.7–4.7)
Alkaline Phosphatase: 145 IU/L — ABNORMAL HIGH (ref 39–117)
BUN/Creatinine Ratio: 18 (ref 12–28)
BUN: 20 mg/dL (ref 8–27)
Bilirubin Total: 0.6 mg/dL (ref 0.0–1.2)
CO2: 24 mmol/L (ref 20–29)
Calcium: 9.9 mg/dL (ref 8.7–10.3)
Chloride: 94 mmol/L — ABNORMAL LOW (ref 96–106)
Creatinine, Ser: 1.12 mg/dL — ABNORMAL HIGH (ref 0.57–1.00)
GFR calc Af Amer: 54 mL/min/{1.73_m2} — ABNORMAL LOW (ref >59)
GFR calc non Af Amer: 47 mL/min/{1.73_m2} — ABNORMAL LOW (ref >59)
Globulin, Total: 2.9 g/dL (ref 1.5–4.5)
Glucose: 101 mg/dL — ABNORMAL HIGH (ref 65–99)
Potassium: 5.1 mmol/L (ref 3.5–5.2)
Sodium: 136 mmol/L (ref 134–144)
Total Protein: 7.3 g/dL (ref 6.0–8.5)

## 2018-12-26 LAB — LIPID PANEL
Chol/HDL Ratio: 3.4 ratio (ref 0.0–4.4)
Cholesterol, Total: 241 mg/dL — ABNORMAL HIGH (ref 100–199)
HDL: 71 mg/dL (ref >39)
LDL Chol Calc (NIH): 140 mg/dL — ABNORMAL HIGH (ref 0–99)
Triglycerides: 170 mg/dL — ABNORMAL HIGH (ref 0–149)
VLDL Cholesterol Cal: 30 mg/dL (ref 5–40)

## 2018-12-26 NOTE — Patient Instructions (Signed)
Medication Instructions:  No changes *If you need a refill on your cardiac medications before your next appointment, please call your pharmacy*  Lab Work: Your provider would like for you to have the following labs today: Lipid and CMET  If you have labs (blood work) drawn today and your tests are completely normal, you will receive your results only by: Marland Kitchen MyChart Message (if you have MyChart) OR . A paper copy in the mail If you have any lab test that is abnormal or we need to change your treatment, we will call you to review the results.  Testing/Procedures: None ordered  Follow-Up: At [facility], you and your health needs are our priority.  As part of our continuing mission to provide you with exceptional heart care, we have created designated Provider Care Teams.  These Care Teams include your primary Cardiologist (physician) and Advanced Practice Providers (APPs -  Physician Assistants and Nurse Practitioners) who all work together to provide you with the care you need, when you need it.  Your next appointment:   6 months  The format for your next appointment:   In Person  Provider:   Sanda Klein, MD

## 2018-12-26 NOTE — Progress Notes (Signed)
Patient ID: [name], female   DOB: 05/19/1940, 78 y.o.   MRN: [id number]     Cardiology Office Note    Date:  12/28/2018   ID:  [name], DOB 24-Jun-1940, MRN [id number]  PCP:  Horald Pollen, MD  Cardiologist:   Sanda Klein, MD   Chief Complaint  Patient presents with  . Coronary Artery Disease  . PAD    History of Present Illness:  [name] is a 78 y.o. female with coronary artery disease s/p bypass surgery (November 2016) and placement of drug-eluting stents (DES to distal SVG-RCA, April 2017 ) and left atrial appendage clipping, symptomatic paroxysmal atrial fibrillation and atrial flutter with recent radiofrequency ablation (caval tricuspid isthmus and pulmonary vein isolation), PAD with previous stents, extensive intracranial atherosclerotic disease, history of GI bleeding while on anticoagulation, volatile hypertension.  In late August 2020 she underwent an abdominal aortic angiogram with runoff, followed by angioplasty and drug coated stenting of a chronic total occlusion of the right superficial femoral artery. Preprocedure ABI was 0.82  Post procedure ABI was 1.1 at follow-up on September 8 by ultrasound.  She does not have intermittent claudication.  She does not have exertional angina or exertional dyspnea and denies palpitations or syncope.  She has not had any focal neurological events.  Blood pressure has been well controlled.  Interrogation of her pacemaker does not show any recent atrial fibrillation.  Anticoagulation was stopped a while back after she had several falls, some associated with significant injury.  She does have an atrial appendage surgical clip.  She complains of a sensation of "stinging" in her feet, that occurs at rest, not with physical activity.  He does not have numbness or paresthesias or other symptoms of neuropathy.  She is no longer taking ACE inhibitors.  In the past we had to stop these due to persistent hyperkalemia  She is  compliant with dofetilide therapy.  Today her QTc interval was 470 ms  Amiodarone caused hypothyroidism and alopecia. She has tried taking statins but has repeatedly stopped them due to musculoskeletal problems. Most recently she was intolerant to atorvastatin and rosuvastatin. She is currently not taking any lipid-lowering agents.  Labs rechecked today show a LDL cholesterol of 140.  Pacemaker interrogation shows normal device function.  Her chronic Adapta dual-chamber device was implanted in 2016 and still has roughly 60 years of estimated longevity.  She has virtually 100% atrial paced, ventricular sensed rhythm.  She has not had any atrial fibrillation since her last device check although she has had occasional episodes of paroxysmal atrial tachycardia up to a maximum of 5 seconds duration.  Heart rate histograms are excellent.  She has normal left ventricular systolic function. She does have evidence of grade 2 diastolic dysfunction on previous echo. At the time of her bypass surgery she received an Atricure left atrial appendage clip. She is also on clopidogrel following her drug-eluting stents in April 2017. The pacemaker was implanted in 2013 for symptomatic sinus bradycardia. She also has a history of peripheral arterial disease and received bilateral superficial femoral artery stents in 2016, repeat revascularization with drug-coated stent to a totally occluded right superficial femoral artery in 2020. She has never smoked but drinks daily. She has treated hypertension.  After undergoing bypass surgery in November 2016 she returned with non-ST segment elevation myocardial infarction in April 2017 and required placement of stents both in the native right coronary artery (3.020 mm Synergy DES) and in the saphenous  vein graft to the distal right coronary artery (2.7538 mm Synergy DES) due to early graft dysfunction. The saphenous vein graft to the ramus intermedius was also occluded, but this vessel  was left for medical therapy.    Past Medical History:  Diagnosis Date  . ANXIETY 12/31/2009  . Aortic insufficiency    a. mod by echo 2017.  Marland Kitchen CAD (coronary artery disease)    a. s/p CABGx3 and LAA clipping in 12/2014, NSTEMI 05/2015 s/p DES to native RCA and SVG-dRCA; occluded ramus-SVG was treated medically). c. neg [facility]uc 10/2015 at [facility].  . Chronic diastolic CHF (congestive heart failure) ([place])   . Chronic fatigue   . CKD (chronic kidney disease), stage II   . DEPRESSION 12/31/2009  . [place] DISEASE, CERVICAL 12/31/2009  . [place] DISEASE, LUMBAR 12/31/2009  . DIVERTICULITIS, HX OF 12/31/2009  . Essential hypertension   . Gait abnormality 03/25/2017  . GLUCOSE INTOLERANCE 12/31/2009  . Habitual alcohol use   . History of stroke    self reports " they say i have had some pin strokes"   . Hypercalcemia   . Hyperkalemia   . Hyperlipidemia 12/31/2009  . Hypothyroidism   . MENOPAUSE, EARLY 12/31/2009  . NSTEMI (non-ST elevated myocardial infarction) ([place]) 05/11/2015  . Orthostatic hypotension   . OSTEOARTHRITIS, HAND   . Pacemaker 01/21/2012   MDT Adapta dual chamber  . Paroxysmal atrial flutter ([place])   . Persistent atrial fibrillation ([place])   . PVD (peripheral vascular disease), hx stents to bil SFAs 02/2010   . Rash, skin     superficial raised red pencil point sized rash bilateral forearms; states " it started when i started the Plavix "   . Ruptured left breast implant   . Symptomatic sinus bradycardia 01/22/2012  . Syncope    a. 11/2015 ? due to medications.  . Syncope    reports on 05-10-17 " i passed out 2 months ago in the bathroom and broke some ribs"    . Tachy-brady syndrome ([place])    a. s/p MDT PPM 2013.  . Tricuspid regurgitation     Past Surgical History:  Procedure Laterality Date  . ABDOMINAL AORTOGRAM W/LOWER EXTREMITY Bilateral 02/15/2017   Procedure: ABDOMINAL AORTOGRAM W/LOWER EXTREMITY;  Surgeon: Lorretta Harp, MD;  Location: [place] [facility];  Service:  Cardiovascular;  Laterality: Bilateral;  bilat  . ABDOMINAL AORTOGRAM W/LOWER EXTREMITY N/A 10/06/2018   Procedure: ABDOMINAL AORTOGRAM W/LOWER EXTREMITY;  Surgeon: Lorretta Harp, MD;  Location: [place] [facility];  Service: Cardiovascular;  Laterality: N/A;  . Ablation of typical atrial flutter (CTI line)  09/05/2016   Dr Antonieta Pert at [facility]   . AUGMENTATION MAMMAPLASTY  1983  . BREAST IMPLANT REMOVAL Bilateral 01/14/2018   Procedure: REMOVAL BILATERAL BREAST IMPLANTS;  Surgeon: Irene Limbo, MD;  Location: [place];  Service: Plastics;  Laterality: Bilateral;  . CAPSULECTOMY Bilateral 01/14/2018   Procedure: CAPSULECTOMY;  Surgeon: Irene Limbo, MD;  Location: [place];  Service: Plastics;  Laterality: Bilateral;  . CARDIAC CATHETERIZATION N/A 01/01/2015   Procedure: Left Heart Cath and Coronary Angiography;  Surgeon: Jettie Booze, MD;  Location: [place] [facility];  Service: Cardiovascular;  Laterality: N/A;  . CARDIAC CATHETERIZATION N/A 05/12/2015   Procedure: Left Heart Cath and Coronary Angiography;  Surgeon: Troy Sine, MD;  Location: [place] [facility];  Service: Cardiovascular;  Laterality: N/A;  . CARDIAC CATHETERIZATION N/A 05/12/2015   Procedure: Coronary Stent Intervention;  Surgeon:  Troy Sine, MD;  Location: [place] [facility];  Service: Cardiovascular;  Laterality: N/A;  . CARDIOVERSION N/A 02/01/2015   Procedure: CARDIOVERSION;  Surgeon: Lelon Perla, MD;  Location[facility] ENDOSCOPY;  Service: Cardiovascular;  Laterality: N/A;  . CARDIOVERSION N/A 08/30/2015   Procedure: CARDIOVERSION;  Surgeon: Sanda Klein, MD;  Location: [place] ENDOSCOPY;  Service: Cardiovascular;  Laterality: N/A;  . CARPAL TUNNEL RELEASE  2008   "right hand/thumb; carpal tunnel repair; got rid of arthritis" (01/21/2012)  . CLIPPING OF ATRIAL APPENDAGE N/A 01/04/2015   Procedure: CLIPPING OF ATRIAL APPENDAGE;  Surgeon: Grace Isaac, MD;  Location:  [place];  Service: Open Heart Surgery;  Laterality: N/A;  . COLONOSCOPY N/A 12/21/2015   Procedure: COLONOSCOPY;  Surgeon: Milus Banister, MD;  Location: [facility];  Service: Endoscopy;  Laterality: N/A;  . CORONARY ARTERY BYPASS GRAFT N/A 01/04/2015   Procedure: CORONARY ARTERY BYPASS GRAFTING (CABG) x 3 using left internal mammory artery and greater saphenous vein right leg harvested endoscopically.;  Surgeon: Grace Isaac, MD;  LIMA-LAD, SVG-RI, SVG-PDA  . ESOPHAGOGASTRODUODENOSCOPY (EGD) WITH PROPOFOL N/A 12/18/2015   Procedure: ESOPHAGOGASTRODUODENOSCOPY (EGD) WITH PROPOFOL;  Surgeon: Milus Banister, MD;  [facility]: [facility];  Service: Endoscopy;  Laterality: N/A;  . FACELIFT, LOWER 2/3  1995   "mini" (01/21/2012)  . GIVENS CAPSULE STUDY N/A 12/21/2015   Procedure: GIVENS CAPSULE STUDY;  Surgeon: Milus Banister, MD;  [facility]: [facility];  Service: Endoscopy;  Laterality: N/A;  . Lower Arterial Examination  10/28/2011   R. SFA stent mild-moderate mixed density plaque with elevated velocities consistent with 50% diameter reduction. L. SFA stent moderate mixed denisty plaque at mid to distal level consistent with 50-69% diameter reduction.  . LOWER EXTREMITY ANGIOGRAPHY N/A 02/15/2017   Procedure: LOWER EXTREMITY ANGIOGRAPHY;  Surgeon: Lorretta Harp, MD;  Location: [place] [facility];  Service: Cardiovascular;  Laterality: N/A;  . OOPHORECTOMY  ~1979  . PARATHYROIDECTOMY N/A 05/13/2017   Procedure: PARATHYROIDECTOMY;  Surgeon: Armandina Gemma, MD;  [facility]: [facility];  Service: General;  Laterality: N/A;  . PARTIAL COLECTOMY  2010  . PERIPHERAL ARTERIAL STENT GRAFT  2012; 2012   "LLE; RLE" (01/21/2012)  . PERIPHERAL VASCULAR BALLOON ANGIOPLASTY Right 02/15/2017   Procedure: PERIPHERAL VASCULAR BALLOON ANGIOPLASTY;  Surgeon: Lorretta Harp, MD;  Location: [place] [facility];  Service: Cardiovascular;  Laterality: Right;  SFA    . PERIPHERAL VASCULAR INTERVENTION Right 10/06/2018    Procedure: PERIPHERAL VASCULAR INTERVENTION;  Surgeon: Lorretta Harp, MD;  Location: [place] [facility];  Service: Cardiovascular;  Laterality: Right;  . PERMANENT PACEMAKER INSERTION N/A 01/21/2012   Medtronic Adapta L implanted by Dr Sallyanne Kuster for tachy/brady syndrome  . POSTERIOR CERVICAL LAMINECTOMY  1985  . TEE WITHOUT CARDIOVERSION N/A 01/04/2015   Procedure: TRANSESOPHAGEAL ECHOCARDIOGRAM (TEE);  Surgeon: Grace Isaac, MD;  Location: [place];  Service: Open Heart Surgery;  Laterality: N/A;  . TEE WITHOUT CARDIOVERSION N/A 02/01/2015   Procedure: TRANSESOPHAGEAL ECHOCARDIOGRAM (TEE);  Surgeon: Lelon Perla, MD;  Location: [facility] ENDOSCOPY;  Service: Cardiovascular;  Laterality: N/A;  . TEE WITHOUT CARDIOVERSION N/A 08/30/2015   Procedure: TRANSESOPHAGEAL ECHOCARDIOGRAM (TEE);  Surgeon: Sanda Klein, MD;  Location: [facility] ENDOSCOPY;  Service: Cardiovascular;  Laterality: N/A;  . VAGINAL HYSTERECTOMY  1975    Current Medications: Outpatient Medications Prior to Visit  Medication Sig Dispense Refill  . alendronate (FOSAMAX) 70 MG tablet Take 70 mg by mouth every Saturday.     Marland Kitchen amLODipine (  NORVASC) 5 MG tablet TAKE 1.5 TABLETS (7.5 MG TOTAL) BY MOUTH DAILY. 135 tablet 2  . aspirin EC 81 MG tablet Take 81 mg by mouth daily.    . citalopram (CELEXA) 40 MG tablet Take 1 tablet (40 mg total) by mouth daily. 90 tablet 3  . clopidogrel (PLAVIX) 75 MG tablet Take 1 tablet (75 mg total) by mouth daily with breakfast. 90 tablet 3  . Coenzyme Q10 (CO Q-10) 100 MG CAPS Take 100 mg by mouth at bedtime.     . docusate sodium (COLACE) 100 MG capsule Take 100 mg by mouth daily as needed for mild constipation.    . dofetilide (TIKOSYN) 250 MCG capsule Take 1 capsule (250 mcg total) by mouth 2 (two) times daily. 180 capsule 3  . fexofenadine-pseudoephedrine (ALLEGRA-D 24) 180-240 MG 24 hr tablet Take 1 tablet by mouth daily as needed (allergies).    . furosemide (LASIX) 40 MG tablet Take 40 mg by mouth  daily as needed for fluid or edema.    Marland Kitchen levothyroxine (SYNTHROID, LEVOTHROID) 25 MCG tablet Take 25 mcg by mouth daily before breakfast.     . MAGNESIUM CITRATE PO Take 420 mg by mouth daily.     . Multiple Vitamins-Minerals (MULTI-VITE) LIQD Take 1 Dose by mouth daily.    Marland Kitchen NATTOKINASE PO Take 1 tablet by mouth daily. "2000 fus"    . nitroGLYCERIN (NITROSTAT) 0.4 MG SL tablet Place 1 tablet (0.4 mg total) under the tongue every 5 (five) minutes x 3 doses as needed for chest pain. 25 tablet 2  . Omega-3 Fatty Acids (OMEGA 3 PO) Take 690 mg by mouth daily.    . TURMERIC PO Take 475 mg by mouth daily.    . vitamin C (ASCORBIC ACID) 500 MG tablet Take 500 mg by mouth daily.     No facility-administered medications prior to visit.      Allergies:   Brilinta [ticagrelor], Clonidine derivatives, Atorvastatin, Crestor [rosuvastatin calcium], Exforge [amlodipine besylate-valsartan], and Spironolactone   Social History   Socioeconomic History  . Marital status: Widowed    Spouse name: Not on file  . Number of children: 3  . Years of education: Not on file  . Highest education level: Not on file  Occupational History  . 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 Financial resource strain: Not on file  . Food insecurity    Worry: Not on file    Inability: Not on file  . Transportation needs    Medical: Not on file    Non-medical: Not on file  Tobacco Use  . Smoking status: Former Smoker    Packs/day: 0.75    Years: 10.00    Pack years: 7.50    Types: Cigarettes    Quit date: 2008    Years since quitting: 12.8  . Smokeless tobacco: Never Used  . Tobacco comment: 01/21/2012 "quit smoking ~ 2002"  Substance and Sexual Activity  . Alcohol use: Yes    Comment: daily [place]  . Drug use: No  . Sexual activity: Not Currently  Lifestyle  . Physical activity    Days per week: Not on file    Minutes per session: Not on file  . Stress: Not on file   Relationships  . Social Herbalist on phone: Not on file    Gets together: Not on file    Attends religious service: Not on file    Active member  of club or organization: Not on file    Attends meetings of clubs or organizations: Not on file    Relationship status: Not on file  Other Topics Concern  . Not on file  Social History Narrative   Lives in [place].  Retired.     Family History:  The patient's family history includes CVA in her brother; Heart attack in her brother; Heart disease in her brother, father, and mother; Hypertension in her brother and mother; Stroke in her father and mother.   ROS:   Please see the history of present illness.    ROS All other systems are reviewed and are negative.   PHYSICAL EXAM:   VS:  BP 110/62   Pulse 83   Temp (!) 97.2 F (36.2 C)   Ht 5' 4.5" (1.638 m)   Wt 155 lb (70.3 kg)   SpO2 98%   BMI 26.19 kg/m     Wt Readings from Last 3 Encounters:  12/26/18 155 lb (70.3 kg)  10/06/18 150 lb (68 kg)  09/21/18 156 lb 3.2 oz (70.9 kg)     General: Alert, oriented x3, no distress, as well.  Well-healed subclavian pacemaker site. Head: no evidence of trauma, PERRL, EOMI, no exophtalmos or lid lag, no myxedema, no xanthelasma; normal ears, nose and oropharynx Neck: normal jugular venous pulsations and no hepatojugular reflux; brisk carotid pulses without delay and no carotid bruits Chest: clear to auscultation, no signs of consolidation by percussion or palpation, normal fremitus, symmetrical and full respiratory excursions Cardiovascular: normal position and quality of the apical impulse, regular rhythm, normal first and second heart sounds, no murmurs, rubs or gallops Abdomen: no tenderness or distention, no masses by palpation, no abnormal pulsatility or arterial bruits, normal bowel sounds, no hepatosplenomegaly Extremities: no clubbing, cyanosis or edema; 2+ radial, ulnar and brachial pulses bilaterally; 2+ right femoral,  posterior tibial and dorsalis pedis pulses; 2+ left femoral, posterior tibial and dorsalis pedis pulses; no subclavian or femoral bruits Neurological: grossly nonfocal Psych: Normal mood and affect   Studies/Labs Reviewed:   EKG:  EKG is ordered today.  It shows atrial paced, ventricular sensed rhythm with QS pattern in leads V1-V2 and ST segment depression T wave inversion in leads V4, V5, V6 and in the inferior leads, QTC 470 ms. Recent Labs: 06/29/2018: Magnesium 2.0 10/03/2018: TSH 1.020 10/07/2018: Hemoglobin 13.3; Platelets 232 12/26/2018: ALT 13; BUN 20; Creatinine, Ser 1.12; Potassium 5.1; Sodium 136   Lipid Panel    Component Value Date/Time   CHOL 241 (H) 12/26/2018 1018   TRIG 170 (H) 12/26/2018 1018   HDL 71 12/26/2018 1018   CHOLHDL 3.4 12/26/2018 1018   CHOLHDL 2.5 05/12/2015 0324   VLDL 23 05/12/2015 0324   LDLCALC 140 (H) 12/26/2018 1018   10/04/2015  BUN 38 creatinine 1.46, Ca 11.6, K 5.8.  Total cholesterol 244, triglycerides 195, HDL 63, LDL 142 10/15/2015  creatinine 1.05, BUN 23, K 6.3, Ca 11.6, ionized calcium  6.0. Intact PTH 83. TSH of 4.88,  free T4 of 1.13. 09/09/2016 Creatinine 1.08, BUN 18, potassium 5.1  ASSESSMENT:    1. PAF (paroxysmal atrial fibrillation) ([place])   2. Dyslipidemia   3. Encounter for monitoring dofetilide therapy   4. Pacemaker   5. Chronic diastolic heart failure ([place])   6. Coronary artery disease of bypass graft of native heart with stable angina pectoris ([place])   7. PAD (peripheral artery disease) ([place])   8. Stenosis of intracranial portion of both internal carotid  arteries   9. Essential hypertension   10. Parathyroid adenoma   11. Hypercholesterolemia     PLAN:  In order of problems listed above:  1. AFib: Excellent and durable reduction in arrhythmia with ablation and the dofetilide.  Rare and brief episodes of paroxysmal atrial tachycardia.  She has had previous clipping of her left atrial appendage.  She is at high risk  for bleeding complications due to a tendency to fall, which has recently lead to serious injuries.  Not on anticoagulants.  Currently on aspirin and clopidogrel following PAD intervention. 2. Dofetilide: QT interval in acceptable range.  Excellent clinical response.  Reminded her about the risks of drug interactions with this medication.  Prefer a basic metabolic panel and EKG at least every 6 months for follow-up. 3. PPM: Full interrogation performed today.  Normal device function.  She has 5076 atrial and ventricular leads and a Medtronic Adapta generator.  Continue remote downloads every 3 months. 4. CHF: Rarely requires diuretics.  She has not taken furosemide in months.  Reminded her that low potassium levels, which can occur with diuretic therapy, can be dangerous. 5. CAD: She does not have angina pectoris on single antianginal therapy with amlodipine. Unfortunately, she had early failure of 2 saphenous vein grafts, one of which was treated with a drug-eluting stent. Her mammary artery bypass was widely patent. 6. PAD: Improved symptoms following her last revascularization procedure in August.  On dual antiplatelet therapy. 7. ASCVD: She has mostly small vessel disease and the plan is for her to remain on clopidogrel indefinitely.  Avoid hypotension. 8. HTN: Blood pressure in the desirable range today (notes that she has lower blood pressure in the left arm and should always check blood pressure on the right). Avoid ACE inhibitors due to history of hyperkalemia while taking lisinopril. 9. 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   10. Hypercholesterolemia: Recheck labs today show severe hypercholesterolemia.  Intolerant to multiple statins which caused both muscle aches and hair loss.  Discussed treatment with either Repatha or Praluent and will begin the process to get this approved.    Medication Adjustments/Labs and Tests Ordered: Current medicines are  reviewed at length with the patient today.  Concerns regarding medicines are outlined above.  Medication changes, Labs and Tests ordered today are listed in the Patient Instructions below. Patient Instructions  Medication Instructions:  No changes *If you need a refill on your cardiac medications before your next appointment, please call your pharmacy*  Lab Work: Your provider would like for you to have the following labs today: Lipid and CMET  If you have labs (blood work) drawn today and your tests are completely normal, you will receive your results only by: Marland Kitchen MyChart Message (if you have MyChart) OR . A paper copy in the mail If you have any lab test that is abnormal or we need to change your treatment, we will call you to review the results.  Testing/Procedures: None ordered  Follow-Up: At [facility], you and your health needs are our priority.  As part of our continuing mission to provide you with exceptional heart care, we have created designated Provider Care Teams.  These Care Teams include your primary Cardiologist (physician) and Advanced Practice Providers (APPs -  Physician Assistants and Nurse Practitioners) who all work together to provide you with the care you need, when you need it.  Your next appointment:   6 months  The format for your next appointment:   In  Person  Provider:   Sanda Klein, MD      Signed, Sanda Klein, MD  12/28/2018 4:42 PM    [address] [place], [place], [place]  [address] Phone: [phone]; Fax: [phone]

## 2018-12-28 ENCOUNTER — Telehealth: Payer: Self-pay | Admitting: *Deleted

## 2018-12-28 ENCOUNTER — Encounter: Payer: Self-pay | Admitting: Cardiovascular Disease

## 2018-12-28 DIAGNOSIS — I10 Essential (primary) hypertension: Secondary | ICD-10-CM

## 2018-12-28 DIAGNOSIS — E785 Hyperlipidemia, unspecified: Secondary | ICD-10-CM

## 2018-12-28 NOTE — Telephone Encounter (Signed)
-----  Message from Sanda Klein, MD sent at 12/26/2018  4:43 PM EST ----- Cholesterol is way too high (LDL 140, target 70). Go ahead w plans for Repatha or Praluent as per her insurance plan formulary. Very ,ild abnormality in alk phos. Repeat LFTs and CMET in 2 months. Creatinine a little higher than before, please make sure to drink enough fluids.

## 2018-12-28 NOTE — Telephone Encounter (Signed)
Left a message for the patient to call back.  According to pharmd, they will move forward with Praluent

## 2018-12-29 ENCOUNTER — Telehealth: Payer: Self-pay

## 2018-12-29 MED ORDER — PRALUENT 150 MG/ML ~~LOC~~ SOAJ: 150.0000 mg | SUBCUTANEOUS | 11 refills | Status: DC

## 2018-12-29 NOTE — Telephone Encounter (Signed)
Called and lmomed the pt stated that the medication praluent 150mg  approved & rx sent. Instructed the pt to contact us for any questions.

## 2019-01-02 NOTE — Telephone Encounter (Signed)
Repeat lab orders placed. The patient has been approved for Praluent per epic note.

## 2019-01-04 ENCOUNTER — Telehealth: Payer: Self-pay | Admitting: Cardiovascular Disease

## 2019-01-04 NOTE — Telephone Encounter (Signed)
Tried to CB pt again goes directly to VM. [id number]

## 2019-01-04 NOTE — Telephone Encounter (Signed)
Notes recorded by Ricci Barker, RN on 01/02/2019 at 2:17 PM EST  Results released to [facility]. The praluent has been approved per pharmd note.  ------  Notes recorded by Sanda Klein, MD on 12/26/2018 at 4:43 PM EST  Cholesterol is way too high (LDL 140, target 70). Go ahead w plans for Repatha or Praluent as per her insurance plan formulary.  Very ,ild abnormality in alk phos. Repeat LFTs and CMET in 2 months.  Creatinine a little higher than before, please make sure to drink enough fluids.  Tried to call pt but phone goes directly to [id number]

## 2019-01-04 NOTE — Telephone Encounter (Signed)
Follow Up:     Please call, pt wants to discuss her lab results from 12-26-18 please. She also would like to find out what is going on with the [place] she is supposed to start on.

## 2019-01-09 ENCOUNTER — Telehealth: Payer: Self-pay

## 2019-01-09 NOTE — Telephone Encounter (Signed)
lmomed the pt will await a callback

## 2019-01-09 NOTE — Telephone Encounter (Signed)
Received a call from patient.She stated she was calling about recent lab results.Stated she saw results on mychart.Results reviewed.Advised Praluent has been approved.Advised to increase fluid intake.Repeat cmet and lft's in 2 months.I will send message to our pharmacist to start [place].

## 2019-01-11 NOTE — Telephone Encounter (Signed)
Duplicate-this has been completed

## 2019-01-24 DIAGNOSIS — I4891 Unspecified atrial fibrillation: Secondary | ICD-10-CM | POA: Diagnosis not present

## 2019-01-24 DIAGNOSIS — E038 Other specified hypothyroidism: Secondary | ICD-10-CM | POA: Diagnosis not present

## 2019-01-24 DIAGNOSIS — E213 Hyperparathyroidism, unspecified: Secondary | ICD-10-CM | POA: Diagnosis not present

## 2019-01-25 ENCOUNTER — Telehealth: Payer: Self-pay | Admitting: Cardiovascular Disease

## 2019-01-25 NOTE — Telephone Encounter (Signed)
    Patient advised that Alirocumab (PRALUENT) 150 MG/ML SOAJ was sent to [facility] in November. Advised patient to contact pharmacy and of additional instruction is needed to contact [place]. Patient states she has issues with incoming calls from [facility].

## 2019-01-31 DIAGNOSIS — G5602 Carpal tunnel syndrome, left upper limb: Secondary | ICD-10-CM | POA: Diagnosis not present

## 2019-01-31 NOTE — Telephone Encounter (Signed)
Can you call her and explain [facility]?  Thanks!

## 2019-02-13 ENCOUNTER — Ambulatory Visit (INDEPENDENT_AMBULATORY_CARE_PROVIDER_SITE_OTHER): Payer: Medicare HMO | Admitting: *Deleted

## 2019-02-13 DIAGNOSIS — I480 Paroxysmal atrial fibrillation: Secondary | ICD-10-CM

## 2019-02-15 ENCOUNTER — Telehealth: Payer: Self-pay

## 2019-02-15 LAB — CUP PACEART REMOTE DEVICE CHECK
Battery Impedance: 673 Ohm
Battery Remaining Longevity: 69 mo
Battery Voltage: 2.78 V
Brady Statistic AP VP Percent: 1 %
Brady Statistic AP VS Percent: 99 %
Brady Statistic AS VP Percent: 0 %
Brady Statistic AS VS Percent: 0 %
Date Time Interrogation Session: 20210105092154
Implantable Lead Implant Date: 20131212
Implantable Lead Implant Date: 20131212
Implantable Lead Location: 753859
Implantable Lead Location: 753860
Implantable Lead Model: 5076
Implantable Lead Model: 5076
Implantable Pulse Generator Implant Date: 20131212
Lead Channel Impedance Value: 401 Ohm
Lead Channel Impedance Value: 498 Ohm
Lead Channel Pacing Threshold Amplitude: 0.625 V
Lead Channel Pacing Threshold Amplitude: 0.75 V
Lead Channel Pacing Threshold Pulse Width: 0.4 ms
Lead Channel Pacing Threshold Pulse Width: 0.4 ms
Lead Channel Setting Pacing Amplitude: 2 V
Lead Channel Setting Pacing Amplitude: 2.5 V
Lead Channel Setting Pacing Pulse Width: 0.4 ms
Lead Channel Setting Sensing Sensitivity: 5.6 mV

## 2019-02-15 NOTE — Telephone Encounter (Signed)
Spoke with patient to remind of missed remote transmission 

## 2019-02-20 DIAGNOSIS — R69 Illness, unspecified: Secondary | ICD-10-CM | POA: Diagnosis not present

## 2019-03-01 DIAGNOSIS — G5602 Carpal tunnel syndrome, left upper limb: Secondary | ICD-10-CM | POA: Diagnosis not present

## 2019-03-02 ENCOUNTER — Encounter: Payer: Self-pay | Admitting: Emergency Medicine

## 2019-03-13 ENCOUNTER — Ambulatory Visit: Payer: Medicare HMO | Attending: Internal Medicine

## 2019-03-13 DIAGNOSIS — Z20822 Contact with and (suspected) exposure to covid-19: Secondary | ICD-10-CM | POA: Diagnosis not present

## 2019-03-14 LAB — NOVEL CORONAVIRUS, NAA: SARS-CoV-2, NAA: NOT DETECTED

## 2019-04-06 DIAGNOSIS — M8580 Other specified disorders of bone density and structure, unspecified site: Secondary | ICD-10-CM | POA: Diagnosis not present

## 2019-04-06 DIAGNOSIS — E213 Hyperparathyroidism, unspecified: Secondary | ICD-10-CM | POA: Diagnosis not present

## 2019-04-06 DIAGNOSIS — E559 Vitamin D deficiency, unspecified: Secondary | ICD-10-CM | POA: Diagnosis not present

## 2019-04-06 DIAGNOSIS — E038 Other specified hypothyroidism: Secondary | ICD-10-CM | POA: Diagnosis not present

## 2019-04-06 DIAGNOSIS — I10 Essential (primary) hypertension: Secondary | ICD-10-CM | POA: Diagnosis not present

## 2019-04-06 MED ORDER — DOFETILIDE 250 MCG PO CAPS: 250.0000 ug | ORAL_CAPSULE | Freq: Two times a day (BID) | ORAL | 3 refills | Status: DC

## 2019-04-20 DIAGNOSIS — R69 Illness, unspecified: Secondary | ICD-10-CM | POA: Diagnosis not present

## 2019-04-24 ENCOUNTER — Ambulatory Visit: Payer: Medicare HMO | Attending: Internal Medicine

## 2019-04-24 DIAGNOSIS — Z20822 Contact with and (suspected) exposure to covid-19: Secondary | ICD-10-CM

## 2019-04-25 LAB — NOVEL CORONAVIRUS, NAA: SARS-CoV-2, NAA: NOT DETECTED

## 2019-05-02 ENCOUNTER — Inpatient Hospital Stay (HOSPITAL_COMMUNITY): Admission: RE | Admit: 2019-05-02 | Payer: Medicare HMO | Source: Ambulatory Visit

## 2019-05-02 IMAGING — DX DG ABDOMEN 2V
2 series · 2 of 2 positions shown · non-contrast
Comparison: CT scan 02/07/2018

CLINICAL DATA: Abdominal discomfort and diarrhea.

EXAM:
ABDOMEN - 2 VIEW

[abdomen erect]
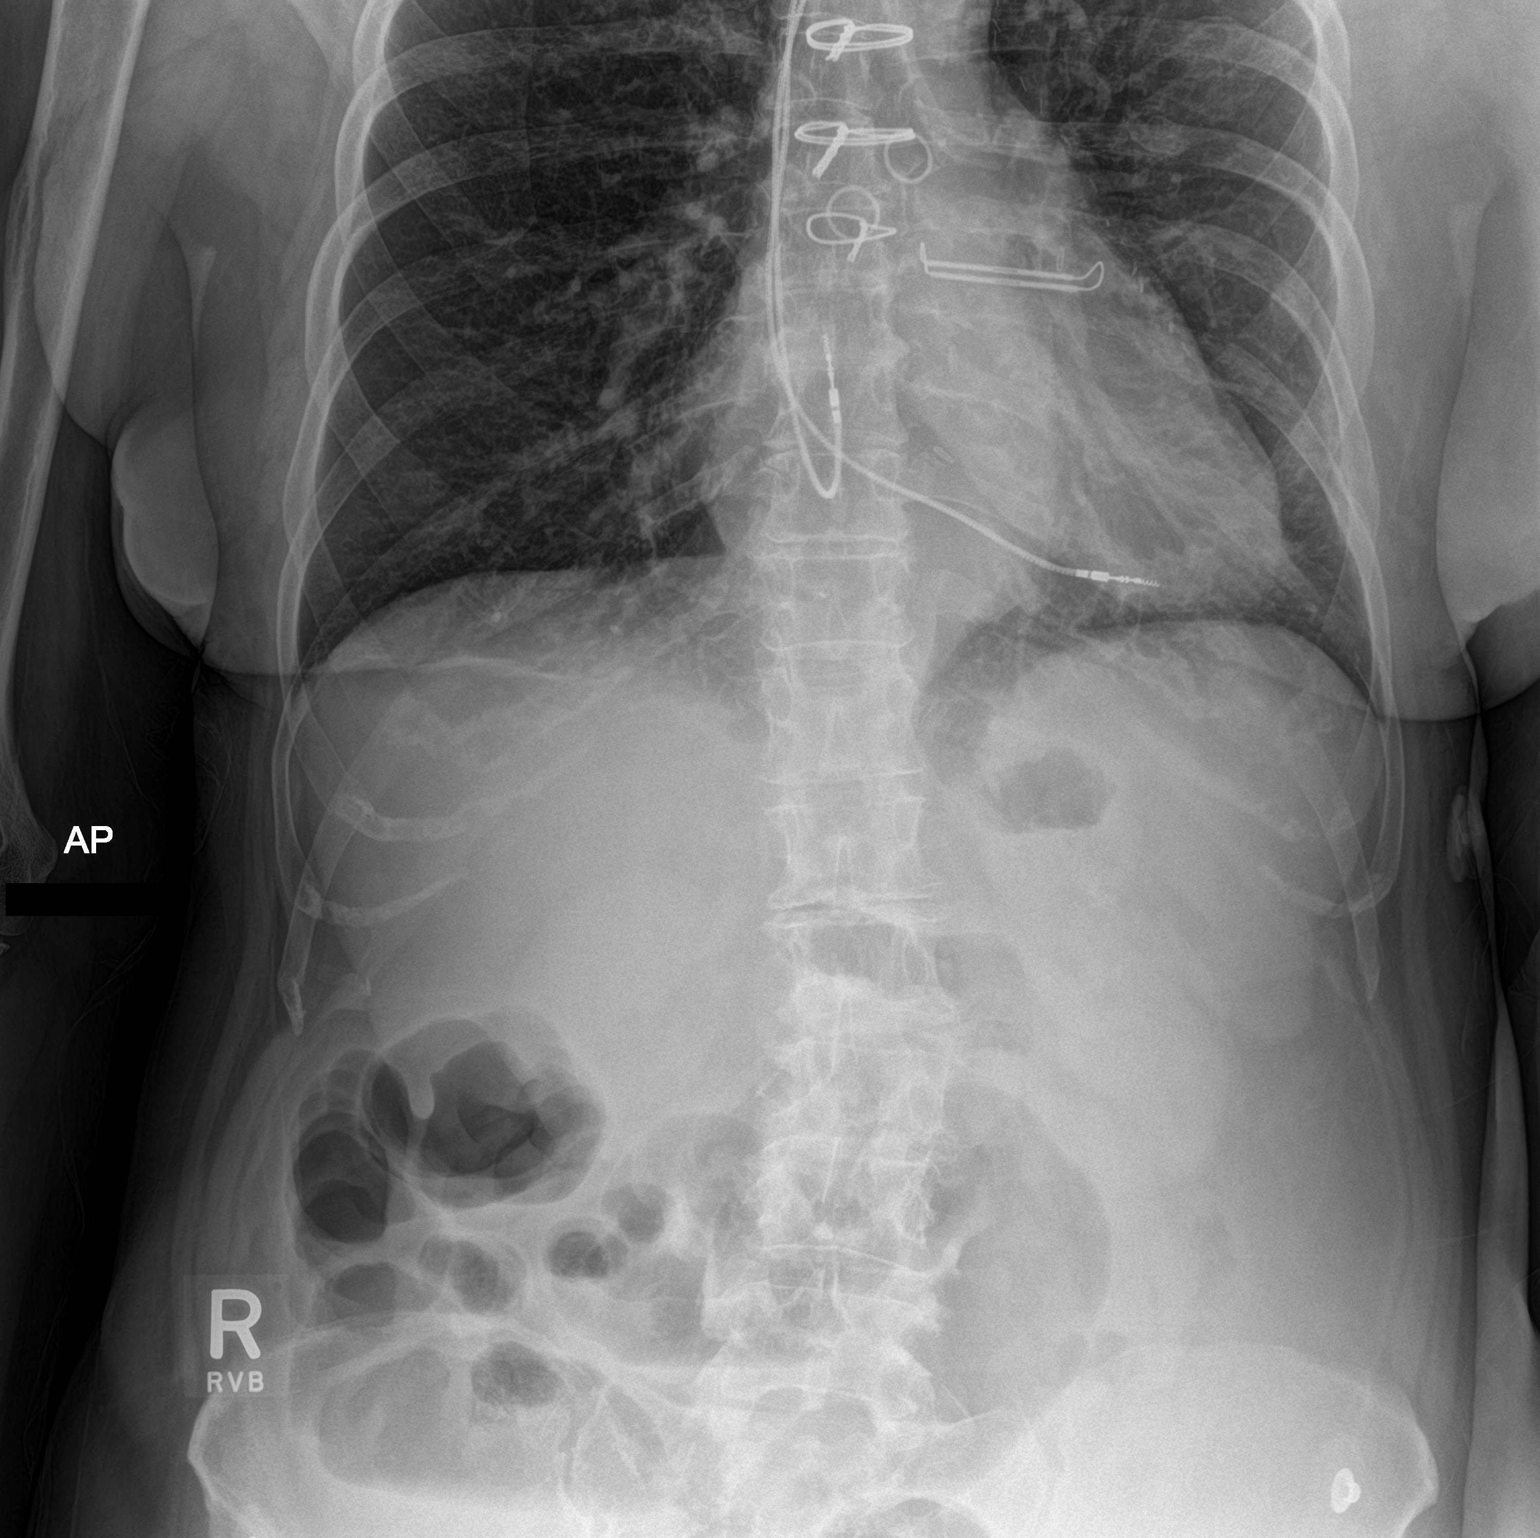

[abdomen supine]
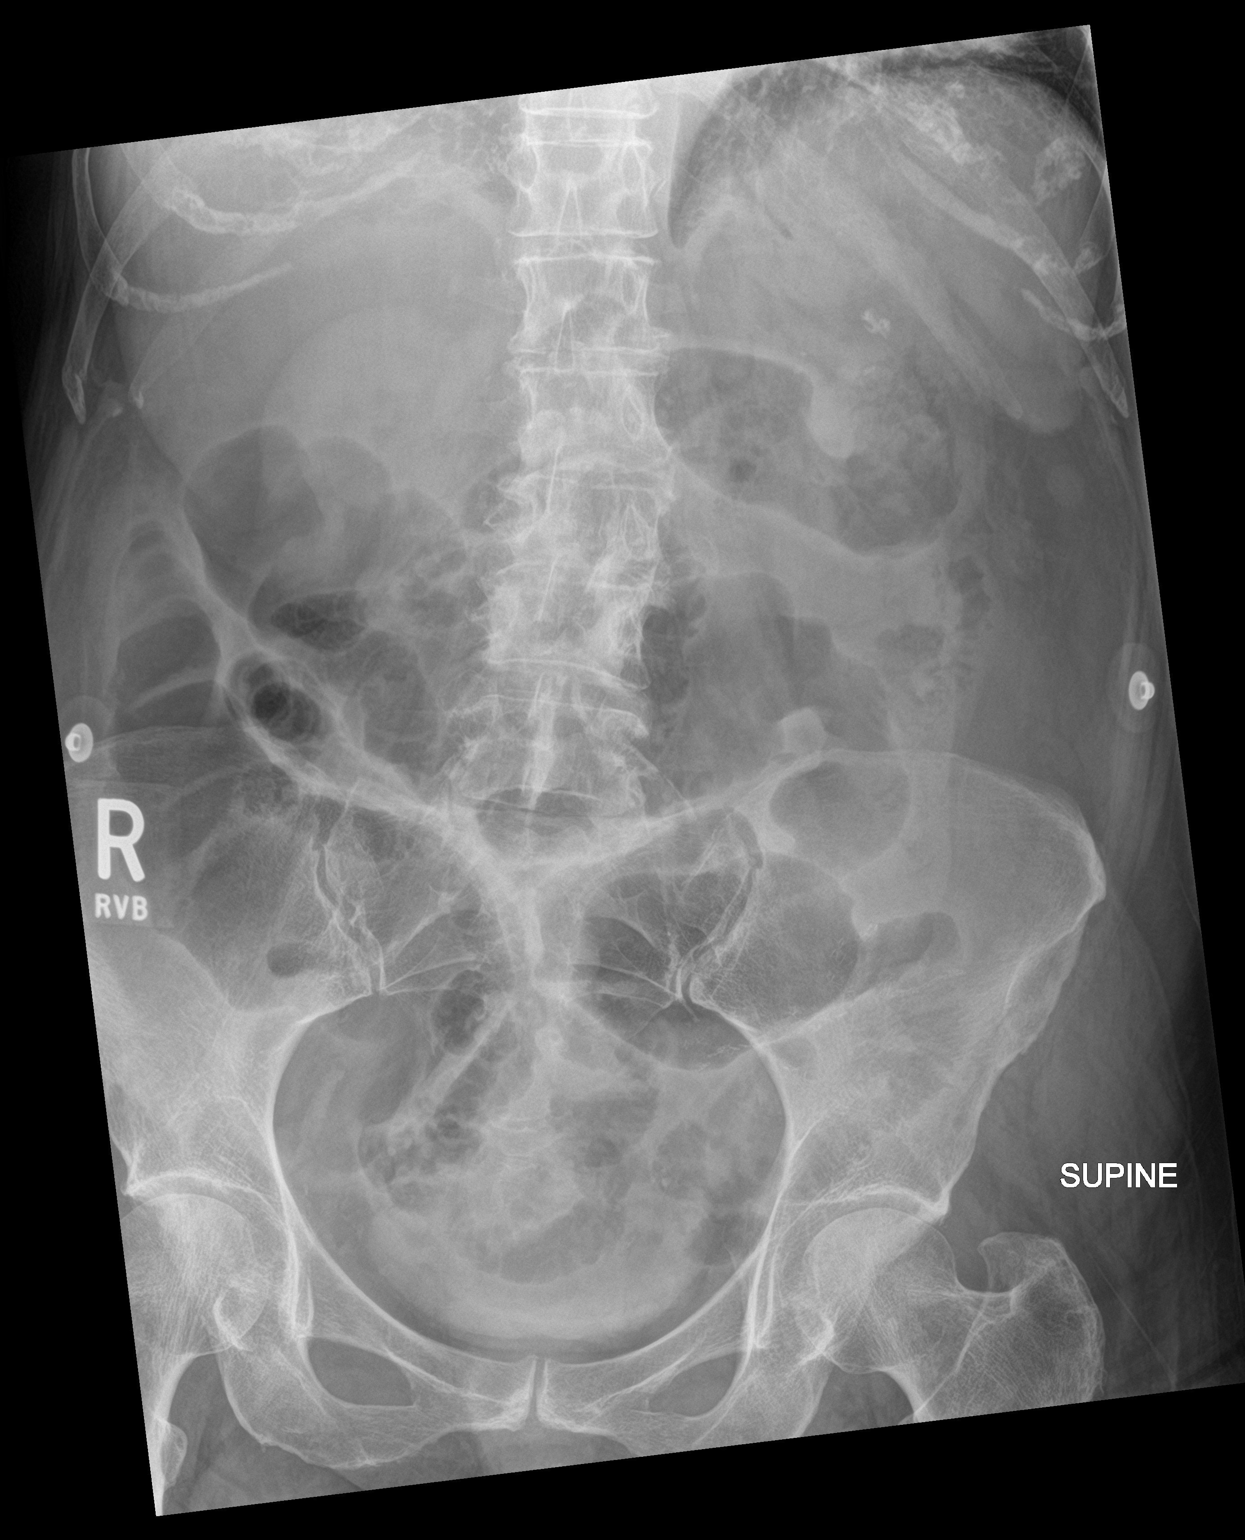

[2 of 2 positions shown; findings below may reference images not displayed]

FINDINGS: The lung bases are clear. Scattered air throughout the small bowel
and colon but no significant distension. No free air.
IMPRESSION: Scattered air throughout the small bowel and colon but no
significant distension.

## 2019-05-10 ENCOUNTER — Ambulatory Visit (HOSPITAL_COMMUNITY)
Admission: RE | Admit: 2019-05-10 | Discharge: 2019-05-10 | Disposition: A | Discharge status: Home / Self Care | Payer: Medicare HMO | Source: Ambulatory Visit | Attending: Internal Medicine | Admitting: Internal Medicine

## 2019-05-10 ENCOUNTER — Other Ambulatory Visit (HOSPITAL_COMMUNITY): Payer: Self-pay | Admitting: Cardiovascular Disease

## 2019-05-10 ENCOUNTER — Other Ambulatory Visit: Payer: Self-pay

## 2019-05-10 DIAGNOSIS — I739 Peripheral vascular disease, unspecified: Secondary | ICD-10-CM | POA: Diagnosis present

## 2019-05-12 ENCOUNTER — Other Ambulatory Visit: Payer: Self-pay | Admitting: *Deleted

## 2019-05-12 DIAGNOSIS — I739 Peripheral vascular disease, unspecified: Secondary | ICD-10-CM

## 2019-05-18 ENCOUNTER — Other Ambulatory Visit: Payer: Self-pay | Admitting: Plastic Surgery

## 2019-05-18 DIAGNOSIS — N644 Mastodynia: Secondary | ICD-10-CM | POA: Diagnosis not present

## 2019-05-18 DIAGNOSIS — Z9882 Breast implant status: Secondary | ICD-10-CM | POA: Diagnosis not present

## 2019-05-18 DIAGNOSIS — N6320 Unspecified lump in the left breast, unspecified quadrant: Secondary | ICD-10-CM

## 2019-05-31 ENCOUNTER — Ambulatory Visit
Admission: RE | Admit: 2019-05-31 | Discharge: 2019-05-31 | Disposition: A | Discharge status: Home / Self Care | Payer: Medicare HMO | Source: Ambulatory Visit | Attending: Plastic Surgery | Admitting: Plastic Surgery

## 2019-05-31 ENCOUNTER — Other Ambulatory Visit: Payer: Self-pay

## 2019-05-31 DIAGNOSIS — N6320 Unspecified lump in the left breast, unspecified quadrant: Secondary | ICD-10-CM

## 2019-05-31 DIAGNOSIS — R928 Other abnormal and inconclusive findings on diagnostic imaging of breast: Secondary | ICD-10-CM | POA: Diagnosis not present

## 2019-06-02 IMAGING — DX DG CHEST 2V
2 series · 2 of 2 positions shown · non-contrast
Comparison: Radiographs March 10, 2017.

CLINICAL DATA: Cough.

EXAM:
CHEST - 2 VIEW

[chest pa]
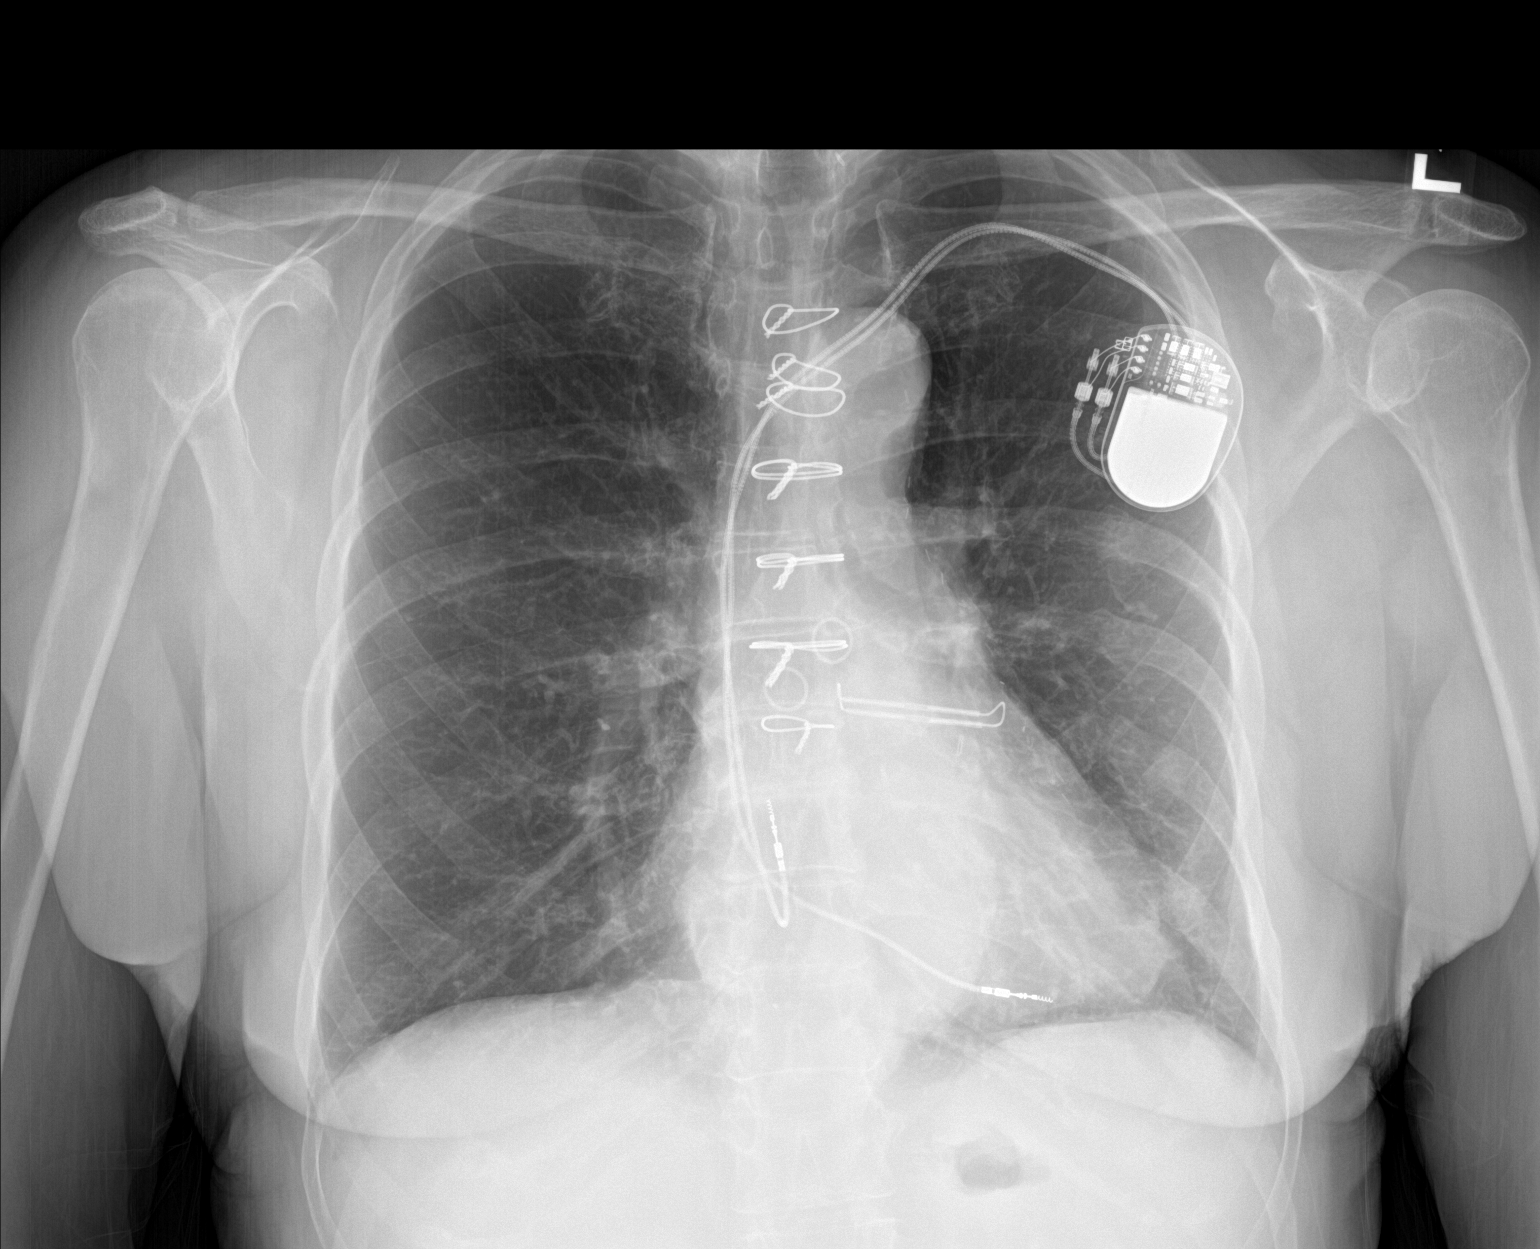

[chest lat]
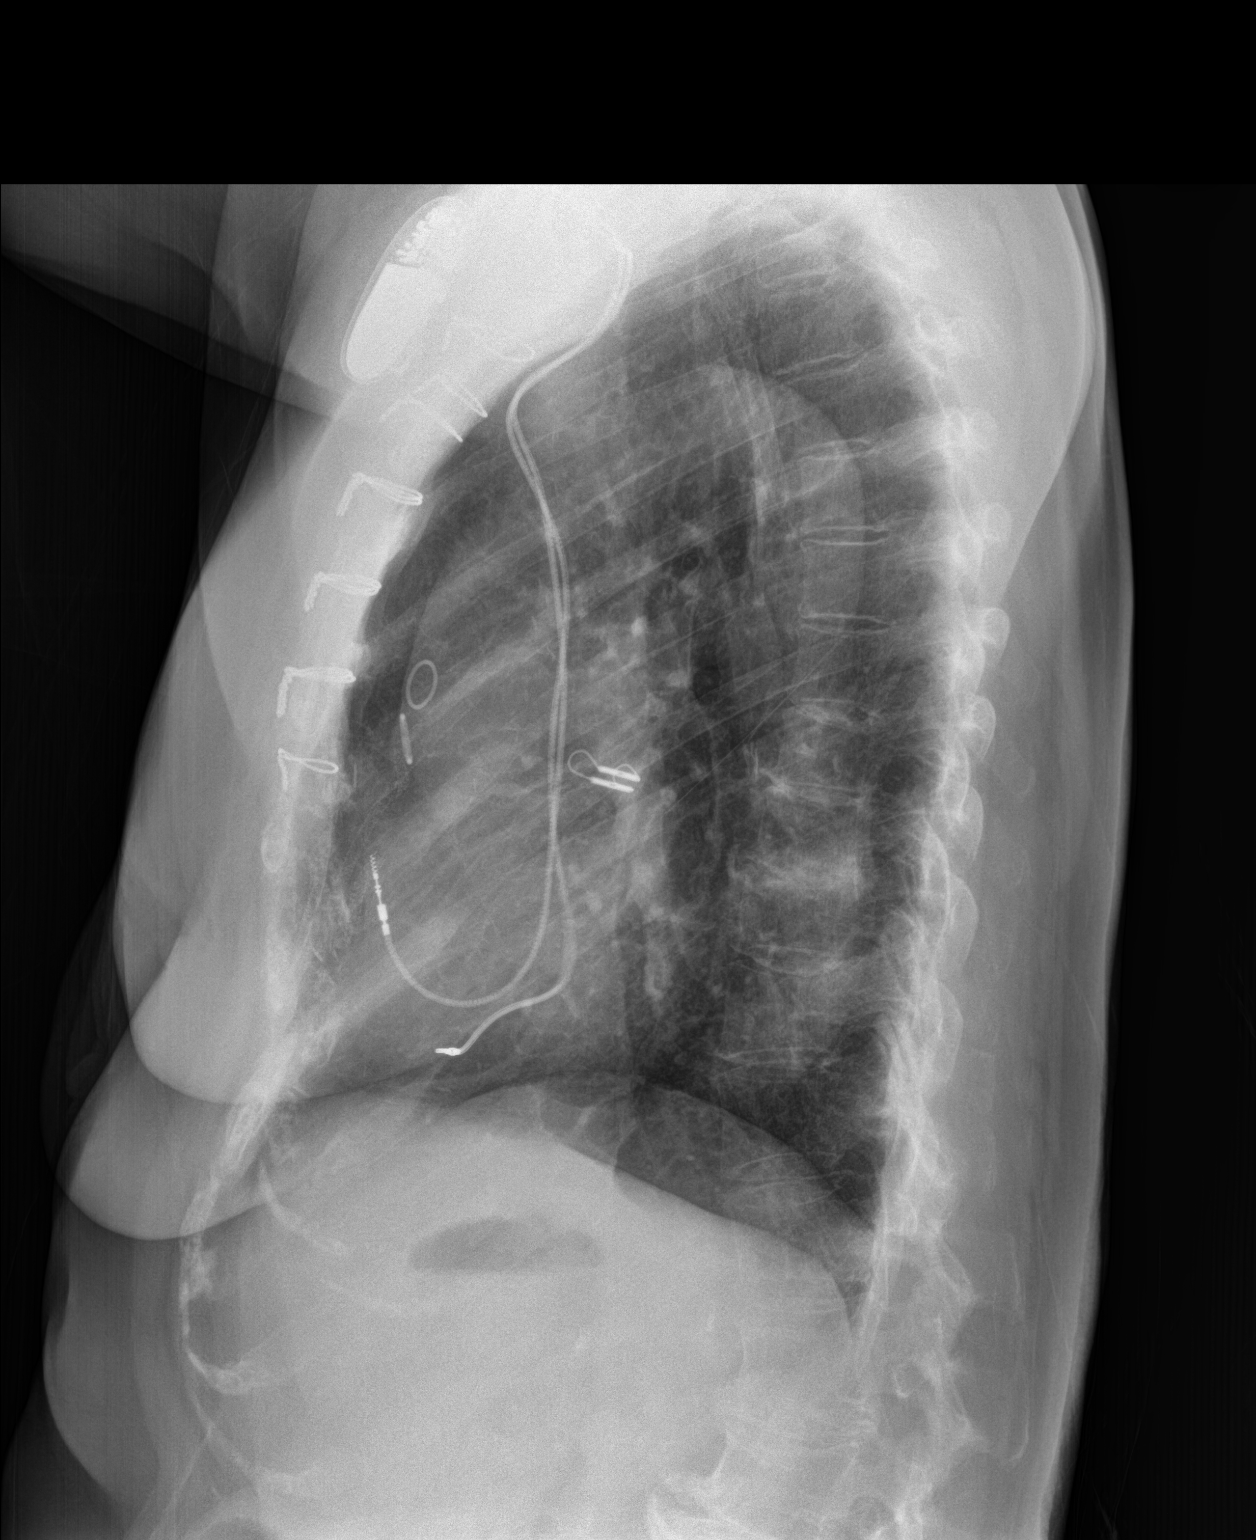

[2 of 2 positions shown; findings below may reference images not displayed]

FINDINGS: The heart size and mediastinal contours are within normal limits. No
pneumothorax or pleural effusion is noted. Status post coronary
bypass graft. Left-sided pacemaker is unchanged in position. Both
lungs are clear. The visualized skeletal structures are
unremarkable.
IMPRESSION: No active cardiopulmonary disease.

## 2019-07-05 ENCOUNTER — Telehealth: Payer: Self-pay | Admitting: Cardiovascular Disease

## 2019-07-05 NOTE — Telephone Encounter (Signed)
New Message  Pt c/o Shortness Of Breath: STAT if SOB developed within the last 24 hours or pt is noticeably SOB on the phone  1. Are you currently SOB (can you hear that pt is SOB on the phone)? Patient says she is sob now, but I do not hear but she is sitting so it more so when she moves   2. How long have you been experiencing SOB? Several months, she feels like its getting worse   3. Are you SOB when sitting or when up moving around? Mostly moving around  4. Are you currently experiencing any other symptoms? Weak, fatigue, some chest pressure.

## 2019-07-05 NOTE — Telephone Encounter (Signed)
[name] is returning Julie's call. Please advise.

## 2019-07-05 NOTE — Telephone Encounter (Signed)
Called patient back- she states that she has SOB, and has some fatigue- she states this has been going on for about a month now- she states she does not have any chest pains, or swelling in her hands and feet, but feel she is overdue for an appointment and now she is having these issues.   Last visit was 6 months ago with Dr.C- I placed her on NP schedule for June 1st, advised if any worsening symptoms- sharp chest pains, arm pain, increase SOB or swelling to go to ED.  Patient verbalized understanding.

## 2019-07-05 NOTE — Telephone Encounter (Signed)
Called patient- LVM to call back to discuss-  Left call back number.

## 2019-07-10 NOTE — Progress Notes (Signed)
Cardiology Clinic Note   Patient Name: [name] Date of Encounter: 07/11/2019  Primary Care Provider:  Horald Pollen, MD Primary Cardiologist:  Sanda Klein, MD  Patient Profile    [name] 79 year old female presents today for evaluation of her shortness of breath.  On 07/05/2019 she indicated that she had ongoing shortness of breath that had been present for around 1 month.  Past Medical History    Past Medical History:  Diagnosis Date  . ANXIETY 12/31/2009  . Aortic insufficiency    a. mod by echo 2017.  Marland Kitchen CAD (coronary artery disease)    a. s/p CABGx3 and LAA clipping in 12/2014, NSTEMI 05/2015 s/p DES to native RCA and SVG-dRCA; occluded ramus-SVG was treated medically). c. neg nuc 10/2015 at [facility].  . Chronic diastolic CHF (congestive heart failure) ([place])   . Chronic fatigue   . CKD (chronic kidney disease), stage II   . DEPRESSION 12/31/2009  . [place] DISEASE, CERVICAL 12/31/2009  . [place] DISEASE, LUMBAR 12/31/2009  . DIVERTICULITIS, HX OF 12/31/2009  . Essential hypertension   . Gait abnormality 03/25/2017  . GLUCOSE INTOLERANCE 12/31/2009  . Habitual alcohol use   . History of stroke    self reports " they say i have had some pin strokes"   . Hypercalcemia   . Hyperkalemia   . Hyperlipidemia 12/31/2009  . Hypothyroidism   . MENOPAUSE, EARLY 12/31/2009  . NSTEMI (non-ST elevated myocardial infarction) ([place]) 05/11/2015  . Orthostatic hypotension   . OSTEOARTHRITIS, HAND   . Pacemaker 01/21/2012   MDT Adapta dual chamber  . Paroxysmal atrial flutter ([place])   . Persistent atrial fibrillation ([place])   . PVD (peripheral vascular disease), hx stents to bil SFAs 02/2010   . Rash, skin     superficial raised red pencil point sized rash bilateral forearms; states " it started when i started the Plavix "   . Ruptured left breast implant   . Symptomatic sinus bradycardia 01/22/2012  . Syncope    a. 11/2015 ? due to medications.  . Syncope    reports on  05-10-17 " i passed out 2 months ago in the bathroom and broke some ribs"    . Tachy-brady syndrome ([place])    a. s/p MDT PPM 2013.  . Tricuspid regurgitation    Past Surgical History:  Procedure Laterality Date  . ABDOMINAL AORTOGRAM W/LOWER EXTREMITY Bilateral 02/15/2017   Procedure: ABDOMINAL AORTOGRAM W/LOWER EXTREMITY;  Surgeon: Lorretta Harp, MD;  Location: [place] CV LAB;  Service: Cardiovascular;  Laterality: Bilateral;  bilat  . ABDOMINAL AORTOGRAM W/LOWER EXTREMITY N/A 10/06/2018   Procedure: ABDOMINAL AORTOGRAM W/LOWER EXTREMITY;  Surgeon: Lorretta Harp, MD;  Location: [place] [facility];  Service: Cardiovascular;  Laterality: N/A;  . Ablation of typical atrial flutter (CTI line)  09/05/2016   Dr Antonieta Pert at [facility]   . [place]   Removed 01/2018  . BREAST IMPLANT REMOVAL Bilateral 01/14/2018   Procedure: REMOVAL BILATERAL BREAST IMPLANTS;  Surgeon: Irene Limbo, MD;  Location: [place];  Service: Plastics;  Laterality: Bilateral;  . CAPSULECTOMY Bilateral 01/14/2018   Procedure: CAPSULECTOMY;  Surgeon: Irene Limbo, MD;  Location: [place];  Service: Plastics;  Laterality: Bilateral;  . CARDIAC CATHETERIZATION N/A 01/01/2015   Procedure: Left Heart Cath and Coronary Angiography;  Surgeon: Jettie Booze, MD;  Location: [place] [facility];  Service: Cardiovascular;  Laterality: N/A;  . CARDIAC CATHETERIZATION N/A 05/12/2015  Procedure: Left Heart Cath and Coronary Angiography;  Surgeon: Troy Sine, MD;  Location: [place] [facility];  Service: Cardiovascular;  Laterality: N/A;  . CARDIAC CATHETERIZATION N/A 05/12/2015   Procedure: Coronary Stent Intervention;  Surgeon: Troy Sine, MD;  Location: [place] [facility];  Service: Cardiovascular;  Laterality: N/A;  . CARDIOVERSION N/A 02/01/2015   Procedure: CARDIOVERSION;  Surgeon: Lelon Perla, MD;  Location: [facility] ENDOSCOPY;  Service: Cardiovascular;   Laterality: N/A;  . CARDIOVERSION N/A 08/30/2015   Procedure: CARDIOVERSION;  Surgeon: Sanda Klein, MD;  Location: [place] ENDOSCOPY;  Service: Cardiovascular;  Laterality: N/A;  . CARPAL TUNNEL RELEASE  2008   "right hand/thumb; carpal tunnel repair; got rid of arthritis" (01/21/2012)  . CLIPPING OF ATRIAL APPENDAGE N/A 01/04/2015   Procedure: CLIPPING OF ATRIAL APPENDAGE;  Surgeon: Grace Isaac, MD;  Location: [place];  Service: Open Heart Surgery;  Laterality: N/A;  . COLONOSCOPY N/A 12/21/2015   Procedure: COLONOSCOPY;  Surgeon: Milus Banister, MD;  Location: [facility] ENDOSCOPY;  Service: Endoscopy;  Laterality: N/A;  . CORONARY ARTERY BYPASS GRAFT N/A 01/04/2015   Procedure: CORONARY ARTERY BYPASS GRAFTING (CABG) x 3 using left internal mammory artery and greater saphenous vein right leg harvested endoscopically.;  Surgeon: Grace Isaac, MD;  LIMA-LAD, SVG-RI, SVG-PDA  . ESOPHAGOGASTRODUODENOSCOPY (EGD) WITH PROPOFOL N/A 12/18/2015   Procedure: ESOPHAGOGASTRODUODENOSCOPY (EGD) WITH PROPOFOL;  Surgeon: Milus Banister, MD;  Location: [facility] ENDOSCOPY;  Service: Endoscopy;  Laterality: N/A;  . FACELIFT, LOWER 2/3  1995   "mini" (01/21/2012)  . GIVENS CAPSULE STUDY N/A 12/21/2015   Procedure: GIVENS CAPSULE STUDY;  Surgeon: Milus Banister, MD;  Location: [facility] ENDOSCOPY;  Service: Endoscopy;  Laterality: N/A;  . Lower Arterial Examination  10/28/2011   R. SFA stent mild-moderate mixed density plaque with elevated velocities consistent with 50% diameter reduction. L. SFA stent moderate mixed denisty plaque at mid to distal level consistent with 50-69% diameter reduction.  . LOWER EXTREMITY ANGIOGRAPHY N/A 02/15/2017   Procedure: LOWER EXTREMITY ANGIOGRAPHY;  Surgeon: Lorretta Harp, MD;  Location: [place] CV LAB;  Service: Cardiovascular;  Laterality: N/A;  . OOPHORECTOMY  ~1979  . PARATHYROIDECTOMY N/A 05/13/2017   Procedure: PARATHYROIDECTOMY;  Surgeon: Armandina Gemma, MD;  Location: [facility];   Service: General;  Laterality: N/A;  . PARTIAL COLECTOMY  2010  . PERIPHERAL ARTERIAL STENT GRAFT  2012; 2012   "LLE; RLE" (01/21/2012)  . PERIPHERAL VASCULAR BALLOON ANGIOPLASTY Right 02/15/2017   Procedure: PERIPHERAL VASCULAR BALLOON ANGIOPLASTY;  Surgeon: Lorretta Harp, MD;  Location: [place] CV LAB;  Service: Cardiovascular;  Laterality: Right;  SFA    . PERIPHERAL VASCULAR INTERVENTION Right 10/06/2018   Procedure: PERIPHERAL VASCULAR INTERVENTION;  Surgeon: Lorretta Harp, MD;  Location: [place] CV LAB;  Service: Cardiovascular;  Laterality: Right;  . PERMANENT PACEMAKER INSERTION N/A 01/21/2012   Medtronic Adapta L implanted by Dr Sallyanne Kuster for tachy/brady syndrome  . POSTERIOR CERVICAL LAMINECTOMY  1985  . TEE WITHOUT CARDIOVERSION N/A 01/04/2015   Procedure: TRANSESOPHAGEAL ECHOCARDIOGRAM (TEE);  Surgeon: Grace Isaac, MD;  Location: [place];  Service: Open Heart Surgery;  Laterality: N/A;  . TEE WITHOUT CARDIOVERSION N/A 02/01/2015   Procedure: TRANSESOPHAGEAL ECHOCARDIOGRAM (TEE);  Surgeon: Lelon Perla, MD;  Location: [facility] ENDOSCOPY;  Service: Cardiovascular;  Laterality: N/A;  . TEE WITHOUT CARDIOVERSION N/A 08/30/2015   Procedure: TRANSESOPHAGEAL ECHOCARDIOGRAM (TEE);  Surgeon: Sanda Klein, MD;  Location: [place];  Service: Cardiovascular;  Laterality: N/A;  .  VAGINAL HYSTERECTOMY  1975    Allergies  Allergies  Allergen Reactions  . Brilinta [Ticagrelor] Shortness Of Breath  . Clonidine Derivatives Other (See Comments)    Lowers heart rate  . Atorvastatin Other (See Comments)    Possible cause of fatigue/malaise  . Crestor [Rosuvastatin Calcium] Other (See Comments)    Joint/muscle aches  . Exforge [Amlodipine Besylate-Valsartan] Itching and Rash  . Spironolactone Other (See Comments)    Contraindicated with history of hyperkalemia    History of Present Illness    Ms. Berkner has a PMH of coronary atherosclerosis, PAF, sinus node  dysfunction, PVD, essential hypertension, CAD, NSTEMI, chronic diastolic CHF, hypothyroidism, CKD stage II, hyperlipidemia, chronic fatigue, PPM, anxiety, left atrial appendage clipping, GI bleed, and depression.  CABG 11/16 PCI with placement of  DES to distal SVG-RCA 4/17  09/2018 she underwent abdominal aortic angiogram with runoff which was followed by angioplasty and drug-coated stenting of a chronic total occlusion of the right superficial femoral artery.  Preprocedure ABI was 0.82 post procedure ABI was 1.1.  She denied intermittent claudication.  Denied exertional angina, exertional dyspnea, palpitations and syncope.  Her blood pressure was well controlled.  Her pacemaker was interrogated and did not show recent episodes of atrial fibrillation and normal device function.  Her anticoagulation was previously stopped due to a history of falls associated with injury.  She complained of sensation of stinging in her feet that would occur at rest but not with physical activity.  She denied numbness or paresthesia and no other neuropathic symptoms.  Amiodarone caused her to have hyperthyroidism and alopecia.  Statins caused myalgias.  She was last seen by Dr. Loletha Grayer on 12/26/2018.  She presents the clinic today for follow-up evaluation and states over the last month she has noticed increased dyspnea with exertion.  She indicates that she is gained about 13 to 14 pounds and has not been as physically active.  She states she has been eating her meals later in the evening and having a couple glasses of wine with dinner.  She also states that she is noticed more right hip pain.  I will order an echocardiogram, have her increase her physical activity, give her the salty 6 dietary sheet.  Her lower extremity ultrasounds were normal on 05/10/2019.  I will send a message to Dr. Gwenlyn Found to see if there are any follow-up tests related to her right hip.  We will have her follow-up in 1 month.  Today she denies chest pain,  shortness of breath, lower extremity edema, fatigue, palpitations, melena, hematuria, hemoptysis, diaphoresis, weakness, presyncope, syncope, orthopnea, and PND.     Home Medications    Prior to Admission medications   Medication Sig Start Date End Date Taking? Authorizing Provider  alendronate (FOSAMAX) 70 MG tablet Take 70 mg by mouth every Saturday.  03/24/16   [provider]  Alirocumab (PRALUENT) 150 MG/ML SOAJ Inject 150 mg into the skin every 14 (fourteen) days. 12/29/18   Croitoru, Mihai, MD  amLODipine (NORVASC) 5 MG tablet TAKE 1.5 TABLETS (7.5 MG TOTAL) BY MOUTH DAILY. 12/05/18   Lorretta Harp, MD  aspirin EC 81 MG tablet Take 81 mg by mouth daily.    [provider]  citalopram (CELEXA) 40 MG tablet Take 1 tablet (40 mg total) by mouth daily. 10/03/18 01/01/19  Horald Pollen, MD  clopidogrel (PLAVIX) 75 MG tablet Take 1 tablet (75 mg total) by mouth daily with breakfast. 10/08/18   Lorretta Harp, MD  Coenzyme Q10 (CO Q-10) 100 MG CAPS Take 100 mg by mouth at bedtime.     [provider]  docusate sodium (COLACE) 100 MG capsule Take 100 mg by mouth daily as needed for mild constipation.    [provider]  dofetilide (TIKOSYN) 250 MCG capsule Take 1 capsule (250 mcg total) by mouth 2 (two) times daily. 04/06/19   Croitoru, Mihai, MD  fexofenadine-pseudoephedrine (ALLEGRA-D 24) 180-240 MG 24 hr tablet Take 1 tablet by mouth daily as needed (allergies).    [provider]  furosemide (LASIX) 40 MG tablet Take 40 mg by mouth daily as needed for fluid or edema.    [provider]  levothyroxine (SYNTHROID, LEVOTHROID) 25 MCG tablet Take 25 mcg by mouth daily before breakfast.  04/03/16   [provider]  MAGNESIUM CITRATE PO Take 420 mg by mouth daily.     [provider]  Multiple Vitamins-Minerals (MULTI-VITE) LIQD Take 1 Dose by mouth daily.    [provider]  NATTOKINASE PO Take 1 tablet by  mouth daily. "2000 fus"    [provider]  nitroGLYCERIN (NITROSTAT) 0.4 MG SL tablet Place 1 tablet (0.4 mg total) under the tongue every 5 (five) minutes x 3 doses as needed for chest pain. 05/15/15   Lyda Jester M, PA-C  Omega-3 Fatty Acids (OMEGA 3 PO) Take 690 mg by mouth daily.    [provider]  TURMERIC PO Take 475 mg by mouth daily.    [provider]  vitamin C (ASCORBIC ACID) 500 MG tablet Take 500 mg by mouth daily.    [provider]    Family History    Family History  Problem Relation Age of Onset  . Heart disease Mother   . Hypertension Mother   . Stroke Mother   . Stroke Father   . Heart disease Father   . Heart disease Brother   . Hypertension Brother        2 brothers  . CVA Brother        2 brothers  . Heart attack Brother        2 brothers   She indicated that her mother is deceased. She indicated that her father is deceased. She indicated that two of her seven brothers are alive. She indicated that her maternal grandmother is deceased. She indicated that her maternal grandfather is deceased. She indicated that her paternal grandmother is deceased. She indicated that her paternal grandfather is deceased.  Social History    Social History   Socioeconomic History  . Marital status: Widowed    Spouse name: Not on file  . Number of children: 3  . Years of education: Not on file  . Highest education level: Not on file  Occupational History  . 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 Smoking status: Former Smoker    Packs/day: 0.75    Years: 10.00    Pack years: 7.50    Types: Cigarettes    Quit date: 2008    Years since quitting: 13.4  . Smokeless tobacco: Never Used  . Tobacco comment: 01/21/2012 "quit smoking ~ 2002"  Substance and Sexual Activity  . Alcohol use: Yes    Comment: daily coctail  . Drug use: No  . Sexual activity: Not Currently  Other Topics Concern  . Not  on file  Social History Narrative   Lives in [place].  Retired.   Social Determinants of Health  Financial Resource Strain:   . Difficulty of Paying Living Expenses:   Food Insecurity:   . Worried About Charity fundraiser in the Last Year:   . Arboriculturist in the Last Year:   Transportation Needs:   . Film/video editor (Medical):   Marland Kitchen Lack of Transportation (Non-Medical):   Physical Activity:   . Days of Exercise per Week:   . Minutes of Exercise per Session:   Stress:   . Feeling of Stress :   Social Connections:   . Frequency of Communication with Friends and Family:   . Frequency of Social Gatherings with Friends and Family:   . Attends Religious Services:   . Active Member of Clubs or Organizations:   . Attends Archivist Meetings:   Marland Kitchen Marital Status:   Intimate Partner Violence:   . Fear of Current or Ex-Partner:   . Emotionally Abused:   Marland Kitchen Physically Abused:   . Sexually Abused:      Review of Systems    General:  No chills, fever, night sweats or weight changes.  Cardiovascular:  No chest pain, dyspnea on exertion, edema, orthopnea, palpitations, paroxysmal nocturnal dyspnea. Dermatological: No rash, lesions/masses Respiratory: No cough, dyspnea Urologic: No hematuria, dysuria Abdominal:   No nausea, vomiting, diarrhea, bright red blood per rectum, melena, or hematemesis Neurologic:  No visual changes, wkns, changes in mental status. All other systems reviewed and are otherwise negative except as noted above.  Physical Exam    VS:  BP (!) 108/58 (BP Location: Left Arm, Patient Position: Sitting, Cuff Size: Normal)   Pulse 90   Temp 98.2 F (36.8 C)   Ht 5' 4.5" (1.638 m)   Wt 160 lb (72.6 kg)   BMI 27.04 kg/m  , BMI Body mass index is 27.04 kg/m. GEN: Well nourished, well developed, in no acute distress. HEENT: normal. Neck: Supple, no JVD, carotid bruits, or masses. Cardiac: RRR, no murmurs, rubs, or gallops. No clubbing,  cyanosis, edema.  Radials/DP/PT 2+ and equal bilaterally.  Respiratory:  Respirations regular and unlabored, clear to auscultation bilaterally. GI: Soft, nontender, nondistended, BS + x 4. MS: no deformity or atrophy. Skin: warm and dry, no rash. Neuro:  Strength and sensation are intact. Psych: Normal affect.  Accessory Clinical Findings    ECG personally reviewed by me today-none today.  EKG 12/26/2018 A paced septal infarct undetermined age 6 bpm   Echocardiogram 10/05/2016 Study Conclusions   - Left ventricle: The cavity size was normal. Wall thickness was  normal. Systolic function was normal. The estimated ejection  fraction was in the range of 55% to 60%. Wall motion was normal;  there were no regional wall motion abnormalities. Features are  consistent with a pseudonormal left ventricular filling pattern,  with concomitant abnormal relaxation and increased filling  pressure (grade 2 diastolic dysfunction).  - Aortic valve: Mildly thickened, mildly calcified leaflets. There  was mild regurgitation.  - Left atrium: The atrium was mildly dilated.  - Right ventricle: The cavity size was moderately dilated. Systolic  function was mildly reduced.  - Right atrium: The atrium was severely dilated.  Assessment & Plan   1.  Dyspnea on exertion/fatigue -no increased work of breathing today.  Contacted clinic on 07/05/2019 and indicated that she had increased shortness of breath x1 month. Continue furosemide Heart healthy low-sodium diet-salty 6 given Increase physical activity as tolerated  Order echocardiogram  Chronic diastolic heart failure-euvolemic today.  No increased work of breathing or  activity intolerance. Continue amlodipine, aspirin, Tikosyn, furosemide Heart healthy low-sodium diet-salty 6 given Increase physical activity as tolerated Lower extremity support stockings Daily weights Elevate extremities when not active Order  echocardiogram  Atrial fibrillation-heart rate today 90.  Previous ablation and left atrial appendage clipping.  Previous history of frequent falls with associated injury.  Anticoagulation stopped Continue aspirin, Tikosyn Heart healthy low-sodium diet-salty 6 given Increase physical activity as tolerated  Coronary artery disease-no chest pain today.  Had early failure of 2 saphenous vein grafts and had DES placed x1.  Cath indicated that LIMA graft was patent. Continue amlodipine, aspirin, and nitroglycerin. Heart healthy low-sodium diet Increase physical activity as tolerated  Disposition: Follow-up with me or Dr. Sallyanne Kuster in 1 month.   Jossie Ng. [place] Dalporto NP-C    07/11/2019, 11:52 AM [place] [address] Office [phone] Fax [phone]

## 2019-07-11 ENCOUNTER — Encounter: Payer: Self-pay | Admitting: General Practice

## 2019-07-11 ENCOUNTER — Ambulatory Visit: Payer: Medicare HMO | Admitting: General Practice

## 2019-07-11 ENCOUNTER — Other Ambulatory Visit: Payer: Self-pay

## 2019-07-11 VITALS — BP 108/58 | HR 90 | Temp 98.2°F | Ht 64.5 in | Wt 160.0 lb

## 2019-07-11 DIAGNOSIS — R0600 Dyspnea, unspecified: Secondary | ICD-10-CM | POA: Diagnosis not present

## 2019-07-11 DIAGNOSIS — I5032 Chronic diastolic (congestive) heart failure: Secondary | ICD-10-CM | POA: Diagnosis not present

## 2019-07-11 DIAGNOSIS — I480 Paroxysmal atrial fibrillation: Secondary | ICD-10-CM | POA: Diagnosis not present

## 2019-07-11 DIAGNOSIS — R5383 Other fatigue: Secondary | ICD-10-CM

## 2019-07-11 DIAGNOSIS — R0609 Other forms of dyspnea: Secondary | ICD-10-CM

## 2019-07-11 DIAGNOSIS — I25708 Atherosclerosis of coronary artery bypass graft(s), unspecified, with other forms of angina pectoris: Secondary | ICD-10-CM

## 2019-07-11 MED ORDER — AMLODIPINE BESYLATE 5 MG PO TABS: 7.5000 mg | ORAL_TABLET | Freq: Every evening | ORAL | 2 refills | Status: DC

## 2019-07-11 NOTE — Telephone Encounter (Signed)
SEEN IN CLINIC TODAY 

## 2019-07-11 NOTE — Patient Instructions (Signed)
Medication Instructions:  The current medical regimen is effective;  continue present plan and medications as directed. Please refer to the Current Medication list given to you today. *If you need a refill on your cardiac medications before your next appointment, please call your pharmacy*  Testing/Procedures: Echocardiogram - Your physician has requested that you have an echocardiogram. Echocardiography is a painless test that uses sound waves to create images of your heart. It provides your doctor with information about the size and shape of your heart and how well your heart's chambers and valves are working. This procedure takes approximately one hour. There are no restrictions for this procedure. This will be performed at our [facility] location - [address].  Special Instructions PLEASE INCREASE PHYSICAL ACTIVITY AS TOLERATED-GOAL 150 MIN/WEEKLY  PLEASE READ AND FOLLOW SALTY 6-ATTACHED  Follow-Up: Your next appointment:  1 month(s) In Person with JESSE CLEAVER, FNP-C  At [facility], you and your health needs are our priority.  As part of our continuing mission to provide you with exceptional heart care, we have created designated Provider Care Teams.  These Care Teams include your primary Cardiologist (physician) and Advanced Practice Providers (APPs -  Physician Assistants and Nurse Practitioners) who all work together to provide you with the care you need, when you need it.

## 2019-07-16 NOTE — Progress Notes (Signed)
Thanks. Unfortunately, her pacemaker is not MRI-conditional. CT is OK, Defer to PCP/orthopedic specialist.

## 2019-07-17 ENCOUNTER — Telehealth: Payer: Self-pay | Admitting: *Deleted

## 2019-07-17 NOTE — Telephone Encounter (Signed)
Handicap plaque paperwork placed in the mail to the patient.

## 2019-07-19 ENCOUNTER — Other Ambulatory Visit: Payer: Self-pay

## 2019-07-19 ENCOUNTER — Encounter: Payer: Self-pay | Admitting: Cardiovascular Disease

## 2019-07-19 ENCOUNTER — Ambulatory Visit (INDEPENDENT_AMBULATORY_CARE_PROVIDER_SITE_OTHER): Payer: Medicare HMO | Admitting: Cardiovascular Disease

## 2019-07-19 VITALS — BP 130/74 | HR 100 | Ht 63.0 in | Wt 158.4 lb

## 2019-07-19 DIAGNOSIS — I739 Peripheral vascular disease, unspecified: Secondary | ICD-10-CM

## 2019-07-19 NOTE — Patient Instructions (Signed)
Medication Instructions:  The current medical regimen is effective;  continue present plan and medications.  *If you need a refill on your cardiac medications before your next appointment, please call your pharmacy*   Testing/Procedures: AORTA/ILLIAC    Follow-Up: At [facility], you and your health needs are our priority.  As part of our continuing mission to provide you with exceptional heart care, we have created designated Provider Care Teams.  These Care Teams include your primary Cardiologist (physician) and Advanced Practice Providers (APPs -  Physician Assistants and Nurse Practitioners) who all work together to provide you with the care you need, when you need it.  We recommend signing up for the patient portal called "MyChart".  Sign up information is provided on this After Visit Summary.  MyChart is used to connect with patients for Virtual Visits (Telemedicine).  Patients are able to view lab/test results, encounter notes, upcoming appointments, etc.  Non-urgent messages can be sent to your provider as well.   To learn more about what you can do with MyChart, go to [email or web address].    Your next appointment:   6 month(s)  The format for your next appointment:   In Person  Provider:   Quay Burow, MD

## 2019-07-19 NOTE — Assessment & Plan Note (Signed)
[name] returns for peripheral vascular evaluation.  I have been working on her SFAs for almost 10 years nail having stented her right and left SFA in a staged fashion back in 2012.  Because of a decline in her right ABI I reangiogrammed her 10/06/2018 revealing an occluded mid right SFA proximal to the stented segment and reconstituting distal to the stent.  I restented her with overlapping Eluvia drug-eluting stents.  Got excellent angiographic result.  Her Dopplers normalized and her claudication resolved.  Her most recent Dopplers performed 05/10/2019 revealed normal ABIs bilaterally and a widely patent right SFA.  She complains of right hip discomfort with ambulation.  Sounds more orthopedic.  She has had no aortoiliac disease in the past.  I am going to get aortoiliac Dopplers to further evaluate.

## 2019-07-19 NOTE — Progress Notes (Signed)
07/19/2019 [name]   11-22-1940  [id number]  Primary Physician Sagardia, Ines Bloomer, MD Primary Cardiologist: Lorretta Harp MD Garret Reddish, [place], [place]  HPI:  [name] is a 79 y.o.  moderately overweight engaged Caucasian female mother of 18, grandmother and 6 grandchildren who was referred by Dr. Sallyanne Kuster for peripheral vascular evaluation because otherwise tolerating claudication.  I last saw her in the office 09/21/2018.  She does have a history of PAF having undergone ablation at [facility] a month ago. She is on Tikosyn andEliquishowever she has not taken her Eliquisseveral weeks. She has a history of CABG 3 in 2016 and subsequent stent procedures because of early graft failure. She also has PAD status post right left SFA intervention in a staged fashion in 2012 by myself. Over the last year she's had recurrent right lower extreme claudication with Dopplers to suggest a high-frequency signal in her proximal right SFA.  I performed peripheral angiography and intervention on her 02/15/2017 with directional atherectomy and drug-coated balloon angioplasty of her proximal right SFA.  Her right and left SFA stents had 30 to 40% "in-stent restenosis bilaterally and she has three-vessel runoff.  Her post procedure Dopplers performed 03/12/2017 revealed ABIs of 1.14 bilaterally with triphasic waveforms.  Her current Dopplers performed 05/04/2018 revealed a decline in her right ABI to .82  with monophasic waveforms beyond the stented segment suggesting progression of "in-stent restenosis.  I performed peripheral angiography on her 10/06/2018 revealing occluded right SFA just proximal to the previously placed stent reconstituting distal to the stent.  I was able to get across this, balloon and restented with overlapping Iluvien drug-eluting stents.  Her follow-up Dopplers performed 10/18/2018 showed normal ABIs and her most recent Dopplers performed 05/10/2019 showed continued patency.   She does complain of right hip discomfort with ambulation although she is never had aortoiliac disease in the past.  She is also complained of increasing shortness of breath.   Current Meds  Medication Sig  . alendronate (FOSAMAX) 70 MG tablet Take 70 mg by mouth every Saturday.   Marland Kitchen amLODipine (NORVASC) 5 MG tablet Take 1.5 tablets (7.5 mg total) by mouth every evening.  Marland Kitchen aspirin EC 81 MG tablet Take 81 mg by mouth daily.  . Cholecalciferol (VITAMIN D3 PO) Take 1 tablet by mouth daily.  . citalopram (CELEXA) 40 MG tablet Take 1 tablet (40 mg total) by mouth daily.  . clopidogrel (PLAVIX) 75 MG tablet Take 1 tablet (75 mg total) by mouth daily with breakfast.  . Coenzyme Q10 (CO Q-10) 100 MG CAPS Take 100 mg by mouth at bedtime.   . docusate sodium (COLACE) 100 MG capsule Take 100 mg by mouth daily as needed for mild constipation.  . dofetilide (TIKOSYN) 250 MCG capsule Take 1 capsule (250 mcg total) by mouth 2 (two) times daily.  . fexofenadine-pseudoephedrine (ALLEGRA-D 24) 180-240 MG 24 hr tablet Take 1 tablet by mouth daily as needed (allergies).  . furosemide (LASIX) 40 MG tablet Take 40 mg by mouth daily as needed for fluid or edema.  Marland Kitchen levothyroxine (SYNTHROID, LEVOTHROID) 25 MCG tablet Take 25 mcg by mouth daily before breakfast.   . MAGNESIUM CITRATE PO Take 420 mg by mouth daily.   . Multiple Vitamins-Minerals (MULTI-VITE) LIQD Take 1 Dose by mouth daily.  Marland Kitchen NATTOKINASE PO Take 1 tablet by mouth daily. "2000 fus"  . NIACIN PO Take 1 tablet by mouth daily.  . nitroGLYCERIN (NITROSTAT) 0.4 MG SL tablet  Place 1 tablet (0.4 mg total) under the tongue every 5 (five) minutes x 3 doses as needed for chest pain.  . Omega-3 Fatty Acids (OMEGA 3 PO) Take 690 mg by mouth daily.  . TURMERIC PO Take 475 mg by mouth daily.  . vitamin C (ASCORBIC ACID) 500 MG tablet Take 500 mg by mouth daily.     Allergies  Allergen Reactions  . Brilinta [Ticagrelor] Shortness Of Breath  . Clonidine  Derivatives Other (See Comments)    Lowers heart rate  . Atorvastatin Other (See Comments)    Possible cause of fatigue/malaise  . Crestor [Rosuvastatin Calcium] Other (See Comments)    Joint/muscle aches  . Exforge [Amlodipine Besylate-Valsartan] Itching and Rash  . Spironolactone Other (See Comments)    Contraindicated with history of hyperkalemia    Social History   Socioeconomic History  . Marital status: Widowed    Spouse name: Not on file  . Number of children: 3  . Years of education: Not on file  . Highest education level: Not on file  Occupational History  . 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 Smoking status: Former Smoker    Packs/day: 0.75    Years: 10.00    Pack years: 7.50    Types: Cigarettes    Quit date: 2008    Years since quitting: 13.4  . Smokeless tobacco: Never Used  . Tobacco comment: 01/21/2012 "quit smoking ~ 2002"  Substance and Sexual Activity  . Alcohol use: Yes    Comment: daily coctail  . Drug use: No  . Sexual activity: Not Currently  Other Topics Concern  . Not on file  Social History Narrative   Lives in [place].  Retired.   Social Determinants of Health   Financial Resource Strain:   . Difficulty of Paying Living Expenses:   Food Insecurity:   . Worried About Charity fundraiser in the Last Year:   . Arboriculturist in the Last Year:   Transportation Needs:   . Film/video editor (Medical):   Marland Kitchen Lack of Transportation (Non-Medical):   Physical Activity:   . Days of Exercise per Week:   . Minutes of Exercise per Session:   Stress:   . Feeling of Stress :   Social Connections:   . Frequency of Communication with Friends and Family:   . Frequency of Social Gatherings with Friends and Family:   . Attends Religious Services:   . Active Member of Clubs or Organizations:   . Attends Archivist Meetings:   Marland Kitchen Marital Status:   Intimate Partner Violence:   . Fear of Current or  Ex-Partner:   . Emotionally Abused:   Marland Kitchen Physically Abused:   . Sexually Abused:      Review of Systems: General: negative for chills, fever, night sweats or weight changes.  Cardiovascular: negative for chest pain, dyspnea on exertion, edema, orthopnea, palpitations, paroxysmal nocturnal dyspnea or shortness of breath Dermatological: negative for rash Respiratory: negative for cough or wheezing Urologic: negative for hematuria Abdominal: negative for nausea, vomiting, diarrhea, bright red blood per rectum, melena, or hematemesis Neurologic: negative for visual changes, syncope, or dizziness All other systems reviewed and are otherwise negative except as noted above.    Blood pressure 130/74, pulse 100, height 5\' 3"  (1.6 m), weight 158 lb 6.4 oz (71.8 kg), SpO2 96 %.  General appearance: alert and no distress Neck: no adenopathy, no carotid bruit, no JVD,  supple, symmetrical, trachea midline and thyroid not enlarged, symmetric, no tenderness/mass/nodules Lungs: clear to auscultation bilaterally Heart: regular rate and rhythm, S1, S2 normal, no murmur, click, rub or gallop Extremities: extremities normal, atraumatic, no cyanosis or edema Pulses: 2+ and symmetric Skin: Skin color, texture, turgor normal. No rashes or lesions Neurologic: Alert and oriented X 3, normal strength and tone. Normal symmetric reflexes. Normal coordination and gait  EKG not performed today  ASSESSMENT AND PLAN:   Claudication in peripheral vascular disease ([place]) [name] returns for peripheral vascular evaluation.  I have been working on her SFAs for almost 10 years nail having stented her right and left SFA in a staged fashion back in 2012.  Because of a decline in her right ABI I reangiogrammed her 10/06/2018 revealing an occluded mid right SFA proximal to the stented segment and reconstituting distal to the stent.  I restented her with overlapping Eluvia drug-eluting stents.  Got excellent angiographic  result.  Her Dopplers normalized and her claudication resolved.  Her most recent Dopplers performed 05/10/2019 revealed normal ABIs bilaterally and a widely patent right SFA.  She complains of right hip discomfort with ambulation.  Sounds more orthopedic.  She has had no aortoiliac disease in the past.  I am going to get aortoiliac Dopplers to further evaluate.      Lorretta Harp MD FACP,FACC,FAHA, [facility] 07/19/2019 4:51 PM

## 2019-07-20 ENCOUNTER — Telehealth: Payer: Self-pay | Admitting: Cardiovascular Disease

## 2019-07-20 NOTE — Telephone Encounter (Signed)
Patient was in yesterday to see Dr. Gwenlyn Found and she states she brought a Handicap sticker application in a few weeks ago for Dr. Sallyanne Kuster to sign  Can we check on this, please.

## 2019-07-20 NOTE — Telephone Encounter (Signed)
Pt notified Handicap application mailed to pt on 07/17/19 ./cy

## 2019-07-20 NOTE — Progress Notes (Signed)
Thanks

## 2019-07-26 ENCOUNTER — Ambulatory Visit (HOSPITAL_COMMUNITY): Payer: Medicare HMO

## 2019-07-31 ENCOUNTER — Other Ambulatory Visit (HOSPITAL_COMMUNITY): Payer: Medicare HMO

## 2019-08-09 ENCOUNTER — Telehealth: Payer: Self-pay

## 2019-08-09 NOTE — Telephone Encounter (Signed)
Voicemail message, 10:49am:  Patient called to schedule an appointment.

## 2019-08-10 ENCOUNTER — Ambulatory Visit (HOSPITAL_COMMUNITY): Payer: Medicare HMO | Attending: Cardiology

## 2019-08-10 ENCOUNTER — Other Ambulatory Visit: Payer: Self-pay

## 2019-08-10 DIAGNOSIS — R0600 Dyspnea, unspecified: Secondary | ICD-10-CM | POA: Diagnosis not present

## 2019-08-10 DIAGNOSIS — R0609 Other forms of dyspnea: Secondary | ICD-10-CM

## 2019-08-10 DIAGNOSIS — R5383 Other fatigue: Secondary | ICD-10-CM

## 2019-08-11 ENCOUNTER — Ambulatory Visit (HOSPITAL_COMMUNITY)
Admission: RE | Admit: 2019-08-11 | Discharge: 2019-08-11 | Disposition: A | Discharge status: Home / Self Care | Payer: Medicare HMO | Source: Ambulatory Visit | Attending: Cardiology | Admitting: Cardiology

## 2019-08-11 DIAGNOSIS — Z95820 Peripheral vascular angioplasty status with implants and grafts: Secondary | ICD-10-CM | POA: Diagnosis not present

## 2019-08-11 DIAGNOSIS — I739 Peripheral vascular disease, unspecified: Secondary | ICD-10-CM | POA: Diagnosis not present

## 2019-08-14 NOTE — Progress Notes (Signed)
Cardiology Clinic Note   Patient Name: [name] Date of Encounter: 08/15/2019  Primary Care Provider:  Horald Pollen, MD Primary Cardiologist:  Sanda Klein, MD  Patient Profile    [name] 79 year old female presents today for follow up evaluation of her shortness of breath.  Past Medical History    Past Medical History:  Diagnosis Date  . ANXIETY 12/31/2009  . Aortic insufficiency    a. mod by echo 2017.  Marland Kitchen CAD (coronary artery disease)    a. s/p CABGx3 and LAA clipping in 12/2014, NSTEMI 05/2015 s/p DES to native RCA and SVG-dRCA; occluded ramus-SVG was treated medically). c. neg nuc 10/2015 at [facility].  . Chronic diastolic CHF (congestive heart failure) ([place])   . Chronic fatigue   . CKD (chronic kidney disease), stage II   . DEPRESSION 12/31/2009  . [place] DISEASE, CERVICAL 12/31/2009  . [place] DISEASE, LUMBAR 12/31/2009  . DIVERTICULITIS, HX OF 12/31/2009  . Essential hypertension   . Gait abnormality 03/25/2017  . GLUCOSE INTOLERANCE 12/31/2009  . Habitual alcohol use   . History of stroke    self reports " they say i have had some pin strokes"   . Hypercalcemia   . Hyperkalemia   . Hyperlipidemia 12/31/2009  . Hypothyroidism   . MENOPAUSE, EARLY 12/31/2009  . NSTEMI (non-ST elevated myocardial infarction) ([place]) 05/11/2015  . Orthostatic hypotension   . OSTEOARTHRITIS, HAND   . Pacemaker 01/21/2012   MDT Adapta dual chamber  . Paroxysmal atrial flutter ([place])   . Persistent atrial fibrillation ([place])   . PVD (peripheral vascular disease), hx stents to bil SFAs 02/2010   . Rash, skin     superficial raised red pencil point sized rash bilateral forearms; states " it started when i started the Plavix "   . Ruptured left breast implant   . Symptomatic sinus bradycardia 01/22/2012  . Syncope    a. 11/2015 ? due to medications.  . Syncope    reports on 05-10-17 " i passed out 2 months ago in the bathroom and broke some ribs"    . Tachy-brady syndrome  ([place])    a. s/p MDT PPM 2013.  . Tricuspid regurgitation    Past Surgical History:  Procedure Laterality Date  . ABDOMINAL AORTOGRAM W/LOWER EXTREMITY Bilateral 02/15/2017   Procedure: ABDOMINAL AORTOGRAM W/LOWER EXTREMITY;  Surgeon: Lorretta Harp, MD;  Location: [place] CV [facility];  Service: Cardiovascular;  Laterality: Bilateral;  bilat  . ABDOMINAL AORTOGRAM W/LOWER EXTREMITY N/A 10/06/2018   Procedure: ABDOMINAL AORTOGRAM W/LOWER EXTREMITY;  Surgeon: Lorretta Harp, MD;  Location: [place] CV LAB;  Service: Cardiovascular;  Laterality: N/A;  . Ablation of typical atrial flutter (CTI line)  09/05/2016   Dr Antonieta Pert at [facility]   . [place]   Removed 01/2018  . BREAST IMPLANT REMOVAL Bilateral 01/14/2018   Procedure: REMOVAL BILATERAL BREAST IMPLANTS;  Surgeon: Irene Limbo, MD;  Location: [place];  Service: Plastics;  Laterality: Bilateral;  . CAPSULECTOMY Bilateral 01/14/2018   Procedure: CAPSULECTOMY;  Surgeon: Irene Limbo, MD;  Location: [place];  Service: Plastics;  Laterality: Bilateral;  . CARDIAC CATHETERIZATION N/A 01/01/2015   Procedure: Left Heart Cath and Coronary Angiography;  Surgeon: Jettie Booze, MD;  Location: [place] CV LAB;  Service: Cardiovascular;  Laterality: N/A;  . CARDIAC CATHETERIZATION N/A 05/12/2015   Procedure: Left Heart Cath and Coronary Angiography;  Surgeon: Troy Sine, MD;  Location: [place]  CV LAB;  Service: Cardiovascular;  Laterality: N/A;  . CARDIAC CATHETERIZATION N/A 05/12/2015   Procedure: Coronary Stent Intervention;  Surgeon: Troy Sine, MD;  Location: [place] CV LAB;  Service: Cardiovascular;  Laterality: N/A;  . CARDIOVERSION N/A 02/01/2015   Procedure: CARDIOVERSION;  Surgeon: Lelon Perla, MD;  Location: [facility] ENDOSCOPY;  Service: Cardiovascular;  Laterality: N/A;  . CARDIOVERSION N/A 08/30/2015   Procedure: CARDIOVERSION;  Surgeon: Sanda Klein, MD;  Location: [place] ENDOSCOPY;  Service: Cardiovascular;  Laterality: N/A;  . CARPAL TUNNEL RELEASE  2008   "right hand/thumb; carpal tunnel repair; got rid of arthritis" (01/21/2012)  . CLIPPING OF ATRIAL APPENDAGE N/A 01/04/2015   Procedure: CLIPPING OF ATRIAL APPENDAGE;  Surgeon: Grace Isaac, MD;  Location: [place];  Service: Open Heart Surgery;  Laterality: N/A;  . COLONOSCOPY N/A 12/21/2015   Procedure: COLONOSCOPY;  Surgeon: Milus Banister, MD;  Location: [facility] ENDOSCOPY;  Service: Endoscopy;  Laterality: N/A;  . CORONARY ARTERY BYPASS GRAFT N/A 01/04/2015   Procedure: CORONARY ARTERY BYPASS GRAFTING (CABG) x 3 using left internal mammory artery and greater saphenous vein right leg harvested endoscopically.;  Surgeon: Grace Isaac, MD;  LIMA-LAD, SVG-RI, SVG-PDA  . ESOPHAGOGASTRODUODENOSCOPY (EGD) WITH PROPOFOL N/A 12/18/2015   Procedure: ESOPHAGOGASTRODUODENOSCOPY (EGD) WITH PROPOFOL;  Surgeon: Milus Banister, MD;  Location: [facility] ENDOSCOPY;  Service: Endoscopy;  Laterality: N/A;  . FACELIFT, LOWER 2/3  1995   "mini" (01/21/2012)  . GIVENS CAPSULE STUDY N/A 12/21/2015   Procedure: GIVENS CAPSULE STUDY;  Surgeon: Milus Banister, MD;  Location: [facility] ENDOSCOPY;  Service: Endoscopy;  Laterality: N/A;  . Lower Arterial Examination  10/28/2011   R. SFA stent mild-moderate mixed density plaque with elevated velocities consistent with 50% diameter reduction. L. SFA stent moderate mixed denisty plaque at mid to distal level consistent with 50-69% diameter reduction.  . LOWER EXTREMITY ANGIOGRAPHY N/A 02/15/2017   Procedure: LOWER EXTREMITY ANGIOGRAPHY;  Surgeon: Lorretta Harp, MD;  Location: [address] CV LAB;  Service: Cardiovascular;  Laterality: N/A;  . OOPHORECTOMY  ~1979  . PARATHYROIDECTOMY N/A 05/13/2017   Procedure: PARATHYROIDECTOMY;  Surgeon: Armandina Gemma, MD;  Location: [facility] ORS;  Service: General;  Laterality: N/A;  . PARTIAL COLECTOMY  2010  . PERIPHERAL ARTERIAL STENT  GRAFT  2012; 2012   "LLE; RLE" (01/21/2012)  . PERIPHERAL VASCULAR BALLOON ANGIOPLASTY Right 02/15/2017   Procedure: PERIPHERAL VASCULAR BALLOON ANGIOPLASTY;  Surgeon: Lorretta Harp, MD;  Location: [place] CV LAB;  Service: Cardiovascular;  Laterality: Right;  SFA    . PERIPHERAL VASCULAR INTERVENTION Right 10/06/2018   Procedure: PERIPHERAL VASCULAR INTERVENTION;  Surgeon: Lorretta Harp, MD;  Location: [place] CV LAB;  Service: Cardiovascular;  Laterality: Right;  . PERMANENT PACEMAKER INSERTION N/A 01/21/2012   Medtronic Adapta L implanted by Dr Sallyanne Kuster for tachy/brady syndrome  . POSTERIOR CERVICAL LAMINECTOMY  1985  . TEE WITHOUT CARDIOVERSION N/A 01/04/2015   Procedure: TRANSESOPHAGEAL ECHOCARDIOGRAM (TEE);  Surgeon: Grace Isaac, MD;  Location: [place];  Service: Open Heart Surgery;  Laterality: N/A;  . TEE WITHOUT CARDIOVERSION N/A 02/01/2015   Procedure: TRANSESOPHAGEAL ECHOCARDIOGRAM (TEE);  Surgeon: Lelon Perla, MD;  Location: [facility] ENDOSCOPY;  Service: Cardiovascular;  Laterality: N/A;  . TEE WITHOUT CARDIOVERSION N/A 08/30/2015   Procedure: TRANSESOPHAGEAL ECHOCARDIOGRAM (TEE);  Surgeon: Sanda Klein, MD;  Location: [facility] ENDOSCOPY;  Service: Cardiovascular;  Laterality: N/A;  . VAGINAL HYSTERECTOMY  1975    Allergies  Allergies  Allergen Reactions  . Brilinta [Ticagrelor]  Shortness Of Breath  . Clonidine Derivatives Other (See Comments)    Lowers heart rate  . Atorvastatin Other (See Comments)    Possible cause of fatigue/malaise  . Crestor [Rosuvastatin Calcium] Other (See Comments)    Joint/muscle aches  . Exforge [Amlodipine Besylate-Valsartan] Itching and Rash  . Spironolactone Other (See Comments)    Contraindicated with history of hyperkalemia    History of Present Illness    Ms. Broder has a PMH of coronary atherosclerosis, PAF, sinus node dysfunction, PVD, essential hypertension, CAD, NSTEMI, chronic diastolic CHF, hypothyroidism, CKD stage  II, hyperlipidemia, chronic fatigue, PPM, anxiety, left atrial appendage clipping, GI bleed, and depression.  CABG 11/16 PCI with placement of  DES to distal SVG-RCA 4/17  09/2018 she underwent abdominal aortic angiogram with runoff which was followed by angioplasty and drug-coated stenting of a chronic total occlusion of the right superficial femoral artery.  Preprocedure ABI was 0.82 post procedure ABI was 1.1.  She denied intermittent claudication.  Denied exertional angina, exertional dyspnea, palpitations and syncope.  Her blood pressure was well controlled.  Her pacemaker was interrogated and did not show recent episodes of atrial fibrillation and normal device function.  Her anticoagulation was previously stopped due to a history of falls associated with injury.  She complained of sensation of stinging in her feet that would occur at rest but not with physical activity.  She denied numbness or paresthesia and no other neuropathic symptoms.  Amiodarone caused her to have hyperthyroidism and alopecia.  Statins caused myalgias.  She was last seen by Dr. Loletha Grayer on 12/26/2018.  She presented the clinic 07/11/19 for follow-up evaluation and stated over the last month she had noticed increased dyspnea with exertion.  She indicated that she had gained about 13 to 14 pounds and has not been as physically active.  She stated she had been eating her meals later in the evening and having a couple glasses of wine with dinner.  She also stated that she  noticed more right hip pain.  I will ordered an echocardiogram, had her increase her physical activity, give her the salty 6 dietary sheet.  Her lower extremity ultrasounds were normal on 05/10/2019.  I sent a message to Dr. Gwenlyn Found to see if there are any follow-up tests related to her right hip.  Follow-up planned for 1 month.  Her echocardiogram showed normal LVEF, grade 2 diastolic dysfunction, and an improvement in left and right atria from prior study. Seen in  follow-up by Dr. Gwenlyn Found on 07/19/2019 for PAD.  It was felt that her hip pain was orthopedic in nature and not related to her PAD.   She presents to the clinic today for follow-up evaluation and states she has started to change her eating habits and is no longer eating late at night.  She is trying to be somewhat more physically active however she is limited by her right hip pain.  She is happy to know that her discomfort in her right hip is not related to her PVD.  She states that she will seek evaluation through orthopedics and try physical therapy if needed.  She states that she has also please that her echocardiogram is better than her previous study.  I have encouraged her to increase her physical activity as tolerated, continue to follow her dietary habits, and follow-up with Dr. Loletha Grayer in 6 months.  Today she denies chest pain, shortness of breath, lower extremity edema, fatigue, palpitations, melena, hematuria, hemoptysis, diaphoresis, weakness, presyncope, syncope, orthopnea, and  PND.  Home Medications    Prior to Admission medications   Medication Sig Start Date End Date Taking? Authorizing Provider  alendronate (FOSAMAX) 70 MG tablet Take 70 mg by mouth every Saturday.  03/24/16   [provider]  amLODipine (NORVASC) 5 MG tablet Take 1.5 tablets (7.5 mg total) by mouth every evening. 07/11/19   Deberah Pelton, NP  aspirin EC 81 MG tablet Take 81 mg by mouth daily.    [provider]  Cholecalciferol (VITAMIN D3 PO) Take 1 tablet by mouth daily.    [provider]  citalopram (CELEXA) 40 MG tablet Take 1 tablet (40 mg total) by mouth daily. 10/03/18 07/19/19  Horald Pollen, MD  clopidogrel (PLAVIX) 75 MG tablet Take 1 tablet (75 mg total) by mouth daily with breakfast. 10/08/18   Lorretta Harp, MD  Coenzyme Q10 (CO Q-10) 100 MG CAPS Take 100 mg by mouth at bedtime.     [provider]  docusate sodium (COLACE) 100 MG capsule Take 100 mg by mouth daily  as needed for mild constipation.    [provider]  dofetilide (TIKOSYN) 250 MCG capsule Take 1 capsule (250 mcg total) by mouth 2 (two) times daily. 04/06/19   Croitoru, Mihai, MD  fexofenadine-pseudoephedrine (ALLEGRA-D 24) 180-240 MG 24 hr tablet Take 1 tablet by mouth daily as needed (allergies).    [provider]  furosemide (LASIX) 40 MG tablet Take 40 mg by mouth daily as needed for fluid or edema.    [provider]  levothyroxine (SYNTHROID, LEVOTHROID) 25 MCG tablet Take 25 mcg by mouth daily before breakfast.  04/03/16   [provider]  MAGNESIUM CITRATE PO Take 420 mg by mouth daily.     [provider]  Multiple Vitamins-Minerals (MULTI-VITE) LIQD Take 1 Dose by mouth daily.    [provider]  NATTOKINASE PO Take 1 tablet by mouth daily. "2000 fus"    [provider]  NIACIN PO Take 1 tablet by mouth daily.    [provider]  nitroGLYCERIN (NITROSTAT) 0.4 MG SL tablet Place 1 tablet (0.4 mg total) under the tongue every 5 (five) minutes x 3 doses as needed for chest pain. 05/15/15   Lyda Jester M, PA-C  Omega-3 Fatty Acids (OMEGA 3 PO) Take 690 mg by mouth daily.    [provider]  TURMERIC PO Take 475 mg by mouth daily.    [provider]  vitamin C (ASCORBIC ACID) 500 MG tablet Take 500 mg by mouth daily.    [provider]    Family History    Family History  Problem Relation Age of Onset  . Heart disease Mother   . Hypertension Mother   . Stroke Mother   . Stroke Father   . Heart disease Father   . Heart disease Brother   . Hypertension Brother        2 brothers  . CVA Brother        2 brothers  . Heart attack Brother        2 brothers   She indicated that her mother is deceased. She indicated that her father is deceased. She indicated that two of her seven brothers are alive. She indicated that her maternal grandmother is deceased. She indicated that her  maternal grandfather is deceased. She indicated that her paternal grandmother is deceased. She indicated that her paternal grandfather is deceased.  Social History    Social History  Socioeconomic History  . Marital status: Widowed    Spouse name: Not on file  . Number of children: 3  . Years of education: Not on file  . Highest education level: Not on file  Occupational History  . 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 Smoking status: Former Smoker    Packs/day: 0.75    Years: 10.00    Pack years: 7.50    Types: Cigarettes    Quit date: 2008    Years since quitting: 13.5  . Smokeless tobacco: Never Used  . Tobacco comment: 01/21/2012 "quit smoking ~ 2002"  Vaping Use  . Vaping Use: Never used  Substance and Sexual Activity  . Alcohol use: Yes    Comment: daily coctail  . Drug use: No  . Sexual activity: Not Currently  Other Topics Concern  . Not on file  Social History Narrative   Lives in [place].  Retired.   Social Determinants of Health   Financial Resource Strain:   . Difficulty of Paying Living Expenses:   Food Insecurity:   . Worried About Charity fundraiser in the Last Year:   . Arboriculturist in the Last Year:   Transportation Needs:   . Film/video editor (Medical):   Marland Kitchen Lack of Transportation (Non-Medical):   Physical Activity:   . Days of Exercise per Week:   . Minutes of Exercise per Session:   Stress:   . Feeling of Stress :   Social Connections:   . Frequency of Communication with Friends and Family:   . Frequency of Social Gatherings with Friends and Family:   . Attends Religious Services:   . Active Member of Clubs or Organizations:   . Attends Archivist Meetings:   Marland Kitchen Marital Status:   Intimate Partner Violence:   . Fear of Current or Ex-Partner:   . Emotionally Abused:   Marland Kitchen Physically Abused:   . Sexually Abused:      Review of Systems    General:  No chills, fever, night sweats  or weight changes.  Cardiovascular:  No chest pain, dyspnea on exertion, edema, orthopnea, palpitations, paroxysmal nocturnal dyspnea. Dermatological: No rash, lesions/masses Respiratory: No cough, dyspnea Urologic: No hematuria, dysuria Abdominal:   No nausea, vomiting, diarrhea, bright red blood per rectum, melena, or hematemesis Neurologic:  No visual changes, wkns, changes in mental status. All other systems reviewed and are otherwise negative except as noted above.  Physical Exam    VS:  BP 120/68 (BP Location: Left Arm, Patient Position: Sitting, Cuff Size: Normal)   Pulse 88   Ht 5\' 3"  (1.6 m)   Wt 157 lb (71.2 kg)   SpO2 98%   BMI 27.81 kg/m  , BMI Body mass index is 27.81 kg/m. GEN: Well nourished, well developed, in no acute distress. HEENT: normal. Neck: Supple, no JVD, carotid bruits, or masses. Cardiac: RRR, no murmurs, rubs, or gallops. No clubbing, cyanosis, generalized nonpitting right ankle edema.  Radials/DP/PT 2+ and equal bilaterally.  Respiratory:  Respirations regular and unlabored, clear to auscultation bilaterally. GI: Soft, nontender, nondistended, BS + x 4. MS: no deformity or atrophy. Skin: warm and dry, no rash. Neuro:  Strength and sensation are intact. Psych: Normal affect.  Accessory Clinical Findings    ECG personally reviewed by me today-none today.  EKG 12/26/2018 A paced septal infarct undetermined age 28 bpm  IMPRESSIONS    1. Left ventricular ejection fraction, by estimation,  is 60 to 65%. The  left ventricle has normal function. The left ventricle has no regional  wall motion abnormalities. Left ventricular diastolic parameters are  consistent with Grade II diastolic  dysfunction (pseudonormalization). Elevated left atrial pressure. The  average left ventricular global longitudinal strain is -20.0 %. The global  longitudinal strain is normal.  2. Right ventricular systolic function is normal. The right ventricular  size is  normal. There is mildly elevated pulmonary artery systolic  pressure. The estimated right ventricular systolic pressure is 00.4 mmHg.  3. Left atrial size was mildly dilated.  4. Right atrial size was mildly dilated.  5. The mitral valve is normal in structure. Mild mitral valve  regurgitation. No evidence of mitral stenosis.  6. Tricuspid valve regurgitation is moderate.  7. The aortic valve is normal in structure. Aortic valve regurgitation is  mild. No aortic stenosis is present.  8. The inferior vena cava is dilated in size with >50% respiratory  variability, suggesting right atrial pressure of 8 mmHg.   Assessment & Plan   1.  Dyspnea on exertion/fatigue -no increased work of breathing today.  Contacted clinic on 07/05/2019 and indicated that she had increased shortness of breath x1 month.  Echocardiogram 7/21 showed normal LV function and G2 DD.  Multifactorial related to increased weight, and lack of physical fitness. Continue furosemide Heart healthy low-sodium diet-salty 6 given Increase physical activity as tolerated  Echocardiogram reviewed-expressed understanding  Chronic diastolic heart failure-euvolemic today. Continues with dyspnea with more intense activities.  Continue amlodipine, aspirin, Tikosyn, furosemide Heart healthy low-sodium diet-salty 6 given Increase physical activity as tolerated Lower extremity support stockings Daily weights Elevate extremities when not active   Atrial fibrillation-heart rate today  88.  Previous ablation and left atrial appendage clipping.  Previous history of frequent falls with associated injury.  Anticoagulation stopped Continue aspirin, Tikosyn Heart healthy low-sodium diet-salty 6 given Increase physical activity as tolerated  Coronary artery disease-no chest pain today.  Had early failure of 2 saphenous vein grafts and had DES placed x1.  Cath indicated that LIMA graft was patent. Continue amlodipine, aspirin, and  nitroglycerin. Heart healthy low-sodium diet Increase physical activity as tolerated  Follow-up with orthopedics for evaluation of right hip pain.  Disposition: Follow-up with me or Dr. Sallyanne Kuster in 6 months.  Jossie Ng. Riven Beebe NP-C    08/15/2019, 3:14 PM [place] [address] Office [phone] Fax [phone]

## 2019-08-15 ENCOUNTER — Other Ambulatory Visit: Payer: Self-pay

## 2019-08-15 ENCOUNTER — Ambulatory Visit (INDEPENDENT_AMBULATORY_CARE_PROVIDER_SITE_OTHER): Payer: Medicare HMO | Admitting: General Practice

## 2019-08-15 ENCOUNTER — Encounter: Payer: Self-pay | Admitting: General Practice

## 2019-08-15 VITALS — BP 120/68 | HR 88 | Ht 63.0 in | Wt 157.0 lb

## 2019-08-15 DIAGNOSIS — I5032 Chronic diastolic (congestive) heart failure: Secondary | ICD-10-CM

## 2019-08-15 DIAGNOSIS — R0600 Dyspnea, unspecified: Secondary | ICD-10-CM | POA: Diagnosis not present

## 2019-08-15 DIAGNOSIS — R0609 Other forms of dyspnea: Secondary | ICD-10-CM

## 2019-08-15 DIAGNOSIS — I25708 Atherosclerosis of coronary artery bypass graft(s), unspecified, with other forms of angina pectoris: Secondary | ICD-10-CM | POA: Diagnosis not present

## 2019-08-15 DIAGNOSIS — I480 Paroxysmal atrial fibrillation: Secondary | ICD-10-CM

## 2019-08-15 NOTE — Patient Instructions (Signed)
Medication Instructions:  The current medical regimen is effective;  continue present plan and medications as directed. Please refer to the Current Medication list given to you today. *If you need a refill on your cardiac medications before your next appointment, please call your pharmacy*  Special Instructions PLEASE CALL AND FOLLOW UP WITH YOUR ORTHOPEDIC MD FOR YOUR RIGHT HIP  PLEASE READ AND FOLLOW SALTY 6-ATTACHED  PLEASE INCREASE PHYSICAL ACTIVITY AS TOLERATED  Follow-Up: Your next appointment:  6 month(s)  In Person with Sanda Klein, MD  At [facility], you and your health needs are our priority.  As part of our continuing mission to provide you with exceptional heart care, we have created designated Provider Care Teams.  These Care Teams include your primary Cardiologist (physician) and Advanced Practice Providers (APPs -  Physician Assistants and Nurse Practitioners) who all work together to provide you with the care you need, when you need it.

## 2019-08-18 NOTE — Progress Notes (Signed)
TY

## 2019-08-25 DIAGNOSIS — M5412 Radiculopathy, cervical region: Secondary | ICD-10-CM | POA: Diagnosis not present

## 2019-08-29 DIAGNOSIS — G5602 Carpal tunnel syndrome, left upper limb: Secondary | ICD-10-CM | POA: Diagnosis not present

## 2019-08-29 DIAGNOSIS — I739 Peripheral vascular disease, unspecified: Secondary | ICD-10-CM | POA: Diagnosis not present

## 2019-08-30 ENCOUNTER — Other Ambulatory Visit: Payer: Self-pay | Admitting: Cardiovascular Disease

## 2019-08-30 NOTE — Telephone Encounter (Signed)
Rx has been sent to the pharmacy electronically. ° °

## 2019-09-05 ENCOUNTER — Ambulatory Visit (HOSPITAL_COMMUNITY)
Admission: EM | Admit: 2019-09-05 | Discharge: 2019-09-05 | Disposition: A | Discharge status: Home / Self Care | Payer: Medicare HMO | Attending: Physician Assistant | Admitting: Physician Assistant

## 2019-09-05 ENCOUNTER — Telehealth: Payer: Self-pay | Admitting: Cardiovascular Disease

## 2019-09-05 ENCOUNTER — Ambulatory Visit (INDEPENDENT_AMBULATORY_CARE_PROVIDER_SITE_OTHER): Payer: Medicare HMO

## 2019-09-05 ENCOUNTER — Encounter (HOSPITAL_COMMUNITY): Payer: Self-pay

## 2019-09-05 ENCOUNTER — Other Ambulatory Visit: Payer: Self-pay

## 2019-09-05 DIAGNOSIS — R053 Chronic cough: Secondary | ICD-10-CM

## 2019-09-05 DIAGNOSIS — R059 Cough, unspecified: Secondary | ICD-10-CM

## 2019-09-05 DIAGNOSIS — R05 Cough: Secondary | ICD-10-CM | POA: Diagnosis not present

## 2019-09-05 DIAGNOSIS — J40 Bronchitis, not specified as acute or chronic: Secondary | ICD-10-CM

## 2019-09-05 MED ORDER — BENZONATATE 100 MG PO CAPS
100.0000 mg | ORAL_CAPSULE | Freq: Three times a day (TID) | ORAL | 0 refills | Status: DC
Start: 2019-09-05 — End: 2019-10-05

## 2019-09-05 MED ORDER — DOXYCYCLINE HYCLATE 100 MG PO CAPS
100.0000 mg | ORAL_CAPSULE | Freq: Two times a day (BID) | ORAL | 0 refills | Status: AC
Start: 2019-09-05 — End: 2019-09-12

## 2019-09-05 MED ORDER — ALBUTEROL SULFATE HFA 108 (90 BASE) MCG/ACT IN AERS: 1.0000 | INHALATION_SPRAY | Freq: Four times a day (QID) | RESPIRATORY_TRACT | 0 refills | Status: DC | PRN

## 2019-09-05 NOTE — ED Provider Notes (Addendum)
[name]    CSN: [id number] Arrival date & time: 09/05/19  6269      History   Chief Complaint Chief Complaint  Patient presents with  . Cough    HPI [name] is a 79 y.o. female.   Patient reports for cough.  She reports she has had a cough present for about 1 year.  Cough has been primarily dry throughout the duration of last year.  However the last 2 to 3 days she has developed a more productive cough with green sputum.  She reports no increase in the frequency of the cough just sputum and congestion feeling.  She denies fevers or chills.  She reports that her chest is a little tight and congested feeling but denies significant increase in her known shortness of breath. She has seen her cardiologist for this recent and had a good heart workup. Reports sore throat from cough.  Denies noticing significant sinus congestion.  Denies abdominal pain, nausea or vomiting.  Denies chest pain. No sick contacts.   Previous smoker quit 20 years ago.  No reported issues with lung disease.     Past Medical History:  Diagnosis Date  . ANXIETY 12/31/2009  . Aortic insufficiency    a. mod by echo 2017.  Marland Kitchen CAD (coronary artery disease)    a. s/p CABGx3 and LAA clipping in 12/2014, NSTEMI 05/2015 s/p DES to native RCA and SVG-dRCA; occluded ramus-SVG was treated medically). c. neg nuc 10/2015 at [facility].  . Chronic diastolic CHF (congestive heart failure) ([place])   . Chronic fatigue   . CKD (chronic kidney disease), stage II   . DEPRESSION 12/31/2009  . [place] DISEASE, CERVICAL 12/31/2009  . [place] DISEASE, LUMBAR 12/31/2009  . DIVERTICULITIS, HX OF 12/31/2009  . Essential hypertension   . Gait abnormality 03/25/2017  . GLUCOSE INTOLERANCE 12/31/2009  . Habitual alcohol use   . History of stroke    self reports " they say i have had some pin strokes"   . Hypercalcemia   . Hyperkalemia   . Hyperlipidemia 12/31/2009  . Hypothyroidism   . MENOPAUSE, EARLY 12/31/2009  .  NSTEMI (non-ST elevated myocardial infarction) ([place]) 05/11/2015  . Orthostatic hypotension   . OSTEOARTHRITIS, HAND   . Pacemaker 01/21/2012   MDT Adapta dual chamber  . Paroxysmal atrial flutter ([place])   . Persistent atrial fibrillation ([place])   . PVD (peripheral vascular disease), hx stents to bil SFAs 02/2010   . Rash, skin     superficial raised red pencil point sized rash bilateral forearms; states " it started when i started the Plavix "   . Ruptured left breast implant   . Symptomatic sinus bradycardia 01/22/2012  . Syncope    a. 11/2015 ? due to medications.  . Syncope    reports on 05-10-17 " i passed out 2 months ago in the bathroom and broke some ribs"    . Tachy-brady syndrome ([place])    a. s/p MDT PPM 2013.  . Tricuspid regurgitation     Patient Active Problem List   Diagnosis Date Noted  . Hypothyroidism (acquired) 02/08/2018  . Coronary artery disease involving coronary bypass graft of native heart without angina pectoris 10/22/2017  . Dyslipidemia 10/22/2017  . Cerebrovascular small vessel disease 04/27/2017  . Gait abnormality 03/25/2017  . Cerebrovascular disease 03/25/2017  . Claudication in peripheral vascular disease ([place]) 02/15/2017  . Hyperparathyroidism ([place]) 05/13/2016  . Pacemaker 01/10/2016  . Benign neoplasm of ascending colon   .  Long term current use of anticoagulant   . CKD (chronic kidney disease), stage II   . H/O amiodarone therapy 12/04/2015  . Chronic diastolic heart failure ([place]) 10/19/2015  . Atypical atrial flutter ([place])   . Paroxysmal atrial flutter ([place]) 08/23/2015  . Chronic fatigue 06/27/2015  . Stented coronary artery   . NSTEMI (non-ST elevated myocardial infarction) ([place]) 05/11/2015  . S/P CABG x 3 01/04/15 01/10/2015  . CAD (coronary artery disease) 01/04/2015  . PAF (paroxysmal atrial fibrillation) ([place]) 01/22/2012  . Sinus node dysfunction ([place]) 01/22/2012  . S/P placement of cardiac pacemaker, medtronic adapta 01/21/12 01/22/2012    . PVD (peripheral vascular disease), hx stents to bil SFAs 02/2010 01/22/2012  . Hyperlipidemia 12/31/2009  . Anxiety 12/31/2009  . DEPRESSION 12/31/2009  . Essential hypertension 12/31/2009  . Coronary atherosclerosis 12/31/2009  . OSTEOARTHRITIS, HAND 12/31/2009  . [place] DISEASE, CERVICAL 12/31/2009  . [place] DISEASE, LUMBAR 12/31/2009  . UNSPECIFIED URINARY INCONTINENCE 12/31/2009    Past Surgical History:  Procedure Laterality Date  . ABDOMINAL AORTOGRAM W/LOWER EXTREMITY Bilateral 02/15/2017   Procedure: ABDOMINAL AORTOGRAM W/LOWER EXTREMITY;  Surgeon: Lorretta Harp, MD;  Location: [place] [facility];  Service: Cardiovascular;  Laterality: Bilateral;  bilat  . ABDOMINAL AORTOGRAM W/LOWER EXTREMITY N/A 10/06/2018   Procedure: ABDOMINAL AORTOGRAM W/LOWER EXTREMITY;  Surgeon: Lorretta Harp, MD;  Location: [place] [facility];  Service: Cardiovascular;  Laterality: N/A;  . Ablation of typical atrial flutter (CTI line)  09/05/2016   Dr Antonieta Pert at [facility]   . [place]   Removed 01/2018  . BREAST IMPLANT REMOVAL Bilateral 01/14/2018   Procedure: REMOVAL BILATERAL BREAST IMPLANTS;  Surgeon: Irene Limbo, MD;  Location: [place];  Service: Plastics;  Laterality: Bilateral;  . CAPSULECTOMY Bilateral 01/14/2018   Procedure: CAPSULECTOMY;  Surgeon: Irene Limbo, MD;  Location: [place];  Service: Plastics;  Laterality: Bilateral;  . CARDIAC CATHETERIZATION N/A 01/01/2015   Procedure: Left Heart Cath and Coronary Angiography;  Surgeon: Jettie Booze, MD;  Location: [place] [facility];  Service: Cardiovascular;  Laterality: N/A;  . CARDIAC CATHETERIZATION N/A 05/12/2015   Procedure: Left Heart Cath and Coronary Angiography;  Surgeon: Troy Sine, MD;  Location: [place] [facility];  Service: Cardiovascular;  Laterality: N/A;  . CARDIAC CATHETERIZATION N/A 05/12/2015   Procedure: Coronary Stent Intervention;  Surgeon: Troy Sine, MD;  Location: [place] [facility];  Service: Cardiovascular;  Laterality: N/A;  . CARDIOVERSION N/A 02/01/2015   Procedure: CARDIOVERSION;  Surgeon: Lelon Perla, MD;  Location: [facility] ENDOSCOPY;  Service: Cardiovascular;  Laterality: N/A;  . CARDIOVERSION N/A 08/30/2015   Procedure: CARDIOVERSION;  Surgeon: Sanda Klein, MD;  Location: [place] ENDOSCOPY;  Service: Cardiovascular;  Laterality: N/A;  . CARPAL TUNNEL RELEASE  2008   "right hand/thumb; carpal tunnel repair; got rid of arthritis" (01/21/2012)  . CLIPPING OF ATRIAL APPENDAGE N/A 01/04/2015   Procedure: CLIPPING OF ATRIAL APPENDAGE;  Surgeon: Grace Isaac, MD;  Location: [place];  Service: Open Heart Surgery;  Laterality: N/A;  . COLONOSCOPY N/A 12/21/2015   Procedure: COLONOSCOPY;  Surgeon: Milus Banister, MD;  Location: [facility] ENDOSCOPY;  Service: Endoscopy;  Laterality: N/A;  . CORONARY ARTERY BYPASS GRAFT N/A 01/04/2015   Procedure: CORONARY ARTERY BYPASS GRAFTING (CABG) x 3 using left internal mammory artery and greater saphenous vein right leg harvested endoscopically.;  Surgeon: Grace Isaac, MD;  LIMA-LAD, SVG-RI, SVG-PDA  . ESOPHAGOGASTRODUODENOSCOPY (EGD) WITH PROPOFOL N/A  12/18/2015   Procedure: ESOPHAGOGASTRODUODENOSCOPY (EGD) WITH PROPOFOL;  Surgeon: Milus Banister, MD;  Location: [facility] ENDOS[facility];  Service: Endoscopy;  Laterality: N/A;  . FACELIFT, LOWER 2/3  1995   "mini" (01/21/2012)  . GIVENS CAPSULE STUDY N/A 12/21/2015   Procedure: GIVENS CAPSULE STUDY;  Surgeon: Milus Banister, MD;  Location: [facility]OS[facility];  Service: Endoscopy;  Laterality: N/A;  . Lower Arterial Examination  10/28/2011   R. SFA stent mild-moderate mixed density plaque with elevated velocities consistent with 50% diameter reduction. L. SFA stent moderate mixed denisty plaque at mid to distal level consistent with 50-69% diameter reduction.  . LOWER EXTREMITY ANGIOGRAPHY N/A 02/15/2017   Procedure: LOWER EXTREMITY ANGIOGRAPHY;  Surgeon: Lorretta Harp, MD;  Location: [place] [facility];  Service: Cardiovascular;  Laterality: N/A;  . OOPHORECTOMY  ~1979  . PARATHYROIDECTOMY N/A 05/13/2017   Procedure: PARATHYROIDECTOMY;  Surgeon: Armandina Gemma, MD;  Location: [facility];  Service: General;  Laterality: N/A;  . PARTIAL COLECTOMY  2010  . PERIPHERAL ARTERIAL STENT GRAFT  2012; 2012   "LLE; RLE" (01/21/2012)  . PERIPHERAL VASCULAR BALLOON ANGIOPLASTY Right 02/15/2017   Procedure: PERIPHERAL VASCULAR BALLOON ANGIOPLASTY;  Surgeon: Lorretta Harp, MD;  Location: [place] [facility];  Service: Cardiovascular;  Laterality: Right;  SFA    . PERIPHERAL VASCULAR INTERVENTION Right 10/06/2018   Procedure: PERIPHERAL VASCULAR INTERVENTION;  Surgeon: Lorretta Harp, MD;  Location: [place] [facility];  Service: Cardiovascular;  Laterality: Right;  . PERMANENT PACEMAKER INSERTION N/A 01/21/2012   Medtronic Adapta L implanted by Dr Sallyanne Kuster for tachy/brady syndrome  . POSTERIOR CERVICAL LAMINECTOMY  1985  . TEE WITHOUT CARDIOVERSION N/A 01/04/2015   Procedure: TRANSESOPHAGEAL ECHOCARDIOGRAM (TEE);  Surgeon: Grace Isaac, MD;  Location: [place];  Service: Open Heart Surgery;  Laterality: N/A;  . TEE WITHOUT CARDIOVERSION N/A 02/01/2015   Procedure: TRANSESOPHAGEAL ECHOCARDIOGRAM (TEE);  Surgeon: Lelon Perla, MD;  [facility] ENDOSCOPY;  Service: Cardiovascular;  Laterality: N/A;  . TEE WITHOUT CARDIOVERSION N/A 08/30/2015   Procedure: TRANSESOPHAGEAL ECHOCARDIOGRAM (TEE);  Surgeon: Sanda Klein, MD;  Location: [facility] ENDOSCOPY;  Service: Cardiovascular;  Laterality: N/A;  . [place]    OB History   No obstetric history on file.      Home Medications    Prior to Admission medications   Medication Sig Start Date End Date Taking? Authorizing Provider  albuterol (VENTOLIN HFA) 108 (90 Base) MCG/ACT inhaler Inhale 1-2 puffs into the lungs every 6 (six) hours as needed for wheezing or shortness of breath. 09/05/19   Tran Randle,  Marguerita Beards, PA-C  amLODipine (NORVASC) 5 MG tablet TAKE 1.5 TABLETS (7.5 MG TOTAL) BY MOUTH DAILY. 08/30/19   Lorretta Harp, MD  aspirin EC 81 MG tablet Take 81 mg by mouth daily.    [provider]  benzonatate (TESSALON) 100 MG capsule Take 1 capsule (100 mg total) by mouth every 8 (eight) hours. 09/05/19   Zayla Agar, Marguerita Beards, PA-C  Cholecalciferol (VITAMIN D3 PO) Take 1 tablet by mouth daily.    [provider]  citalopram (CELEXA) 40 MG tablet Take 1 tablet (40 mg total) by mouth daily. 10/03/18 07/19/19  Horald Pollen, MD  clopidogrel (PLAVIX) 75 MG tablet Take 1 tablet (75 mg total) by mouth daily with breakfast. 10/08/18   Lorretta Harp, MD  dofetilide (TIKOSYN) 250 MCG capsule Take 1 capsule (250 mcg total) by mouth 2 (two) times daily. 04/06/19   Croitoru, Dani Gobble, MD  doxycycline (  VIBRAMYCIN) 100 MG capsule Take 1 capsule (100 mg total) by mouth 2 (two) times daily for 7 days. 09/05/19 09/12/19  Makenzy Krist, Marguerita Beards, PA-C  furosemide (LASIX) 40 MG tablet Take 40 mg by mouth daily as needed for fluid or edema.    [provider]  levothyroxine (SYNTHROID, LEVOTHROID) 25 MCG tablet Take 25 mcg by mouth daily before breakfast.  04/03/16   [provider]  MAGNESIUM CITRATE PO Take 420 mg by mouth daily.     [provider]  Multiple Vitamins-Minerals (MULTI-VITE) LIQD Take 1 Dose by mouth daily.    [provider]  NIACIN PO Take 1 tablet by mouth daily.    [provider]  nitroGLYCERIN (NITROSTAT) 0.4 MG SL tablet Place 1 tablet (0.4 mg total) under the tongue every 5 (five) minutes x 3 doses as needed for chest pain. 05/15/15   Lyda Jester M, PA-C  Omega-3 Fatty Acids (OMEGA 3 PO) Take 690 mg by mouth daily.    [provider]  TURMERIC PO Take 475 mg by mouth daily.    [provider]  vitamin C (ASCORBIC ACID) 500 MG tablet Take 500 mg by mouth daily.    [provider]    Family History Family History   Problem Relation Age of Onset  . Heart disease Mother   . Hypertension Mother   . Stroke Mother   . Stroke Father   . Heart disease Father   . Heart disease Brother   . Hypertension Brother        2 brothers  . CVA Brother        2 brothers  . Heart attack Brother        2 brothers    Social History Social History   Tobacco Use  . Smoking status: Former Smoker    Packs/day: 0.75    Years: 10.00    Pack years: 7.50    Types: Cigarettes    Quit date: 2008    Years since quitting: 13.5  . Smokeless tobacco: Never Used  . Tobacco comment: 01/21/2012 "quit smoking ~ 2002"  Vaping Use  . Vaping Use: Never used  Substance Use Topics  . Alcohol use: Yes    Comment: daily coctail  . Drug use: No     Allergies   Brilinta [ticagrelor], Clonidine derivatives, Atorvastatin, Crestor [rosuvastatin calcium], Exforge [amlodipine besylate-valsartan], and Spironolactone   Review of Systems Review of Systems   Physical Exam Triage Vital Signs ED Triage Vitals  Enc Vitals Group     BP 09/05/19 1049 (!) 131/98     Pulse Rate 09/05/19 1049 101     Resp 09/05/19 1049 16     Temp 09/05/19 1049 98.7 F (37.1 C)     Temp src --      SpO2 09/05/19 1049 98 %     Weight --      Height --      Head Circumference --      Peak Flow --      Pain Score 09/05/19 1050 0     Pain Loc --      Pain Edu? --      Excl. in [place]? --    No data found.  Updated Vital Signs BP (!) 131/98   Pulse 101   Temp 98.7 F (37.1 C)   Resp 16   SpO2 98%   Visual Acuity Right Eye Distance:   Left Eye Distance:   Bilateral Distance:  Right Eye Near:   Left Eye Near:    Bilateral Near:     Physical Exam Vitals and nursing note reviewed.  Constitutional:      General: She is not in acute distress.    Appearance: She is well-developed. She is not ill-appearing.  HENT:     Head: Normocephalic and atraumatic.  Eyes:     Conjunctiva/sclera: Conjunctivae normal.  Cardiovascular:      Rate and Rhythm: Normal rate and regular rhythm.     Heart sounds: No murmur heard.   Pulmonary:     Effort: Pulmonary effort is normal. No respiratory distress.     Breath sounds: Normal breath sounds. No wheezing, rhonchi or rales.  Abdominal:     Palpations: Abdomen is soft.     Tenderness: There is no abdominal tenderness.  Musculoskeletal:     Cervical back: Neck supple.  Skin:    General: Skin is warm and dry.  Neurological:     Mental Status: She is alert.      UC Treatments / Results  Labs (all labs ordered are listed, but only abnormal results are displayed) Labs Reviewed - No data to display  EKG   Radiology DG Chest 2 View  Result Date: 09/05/2019 CLINICAL DATA:  Chronic cough EXAM: CHEST - 2 VIEW COMPARISON:  March 11, 2018 FINDINGS: Lungs are clear. Heart is borderline prominent with pulmonary vascularity normal. Pacemaker leads are attached to the right atrium and right ventricle. Patient is status post coronary artery bypass grafting. There is a left atrial appendage clamp. No adenopathy. There is aortic atherosclerosis. There are old healed rib fractures on the left. There is thoracic lordosis. IMPRESSION: No edema or airspace opacity. Borderline cardiac enlargement with postoperative changes. No adenopathy. Aortic Atherosclerosis (ICD10-I70.0). Electronically Signed   By: Lowella Grip III M.D.   On: 09/05/2019 11:55    Procedures Procedures (including critical care time)  Medications Ordered in UC Medications - No data to display  Initial Impression / Assessment and Plan / UC Course  I have reviewed the triage vital signs and the nursing notes.  Pertinent labs & imaging results that were available during my care of the patient were reviewed by me and considered in my medical decision making (see chart for details).     #Bronchitis #Cough Patient is a 79 year old presenting with chronic cough with acute increase in severity with increased sputum  production.  No formal diagnostics for lung disease, however patient's x-ray with some flattening of diaphragms and large lung fields and has been worked up for shortness of breath over the last year with a reassuring cardiac work-up recently.   Former smoker. Some consideration for COPD given. Given chronic cough with acute change and comorbid conditions will  start Doxycycline, Considered azithromycin, however patient has other medications with QT prolongation potential.  Inhaler given as needed for consideration of chest tightness with cough and SOB w/cough .  Cough medicine given.  Discussed this with attending physician Dr. Mannie Stabile and he agrees to this plan.  Discussed with patient that she needs to call her primary care for follow-up and likely more testing.  Strict emergency department and return precautions discussed.  Patient verbalized understanding plan of care. - Patient Declined COVID testing, discussed with patient that given her Age and health conditions, it would be advisable to test for COVID as she would qualify for potential treatments if positive, despite this she declines.  Final Clinical Impressions(s) / UC Diagnoses   Final diagnoses:  Cough  Bronchitis     Discharge Instructions     Take medications as prescribed - doxycycline 2 times a day for 7 days - cough medication as needed every 8 hours  Use inhaler only as needed, up to every 4-6 hours  Call your primary care doctor to have follow up and discuss todays visit  If worsening symptoms, shortness of breath, chest pain or high fever, return or go to the emergency department      ED Prescriptions    Medication Sig Dispense Auth. Provider   benzonatate (TESSALON) 100 MG capsule Take 1 capsule (100 mg total) by mouth every 8 (eight) hours. 21 capsule Karelyn Brisby, Marguerita Beards, PA-C   albuterol (VENTOLIN HFA) 108 (90 Base) MCG/ACT inhaler Inhale 1-2 puffs into the lungs every 6 (six) hours as needed for wheezing or shortness of  breath. 8 g Rutherford Alarie, Marguerita Beards, PA-C   doxycycline (VIBRAMYCIN) 100 MG capsule Take 1 capsule (100 mg total) by mouth 2 (two) times daily for 7 days. 14 capsule Brennen Camper, Marguerita Beards, PA-C     PDMP not reviewed this encounter.   Jaslyn Bansal, Marguerita Beards, PA-C 09/05/19 2117    Antwann Preziosi, Marguerita Beards, PA-C 09/06/19 1035    Rudine Rieger, Marguerita Beards, PA-C 09/06/19 1349    Ronin Rehfeldt, Marguerita Beards, PA-C 09/06/19 1428

## 2019-09-05 NOTE — Discharge Instructions (Signed)
Take medications as prescribed - doxycycline 2 times a day for 7 days - cough medication as needed every 8 hours  Use inhaler only as needed, up to every 4-6 hours  Call your primary care doctor to have follow up and discuss todays visit  If worsening symptoms, shortness of breath, chest pain or high fever, return or go to the emergency department

## 2019-09-05 NOTE — Telephone Encounter (Signed)
The patient called in requesting a referral to a pulmonologist. She stated that she has had a cough for over a year and had to go to Urgent Care this morning. She stated that they have recommended a pulmonologist for possible "COPD" due to her X-Ray results. She has been advised to call her PCP for a referral but stated that he is out of town so would like to know if Dr. Sallyanne Kuster would place the referral.    FINDINGS: Lungs are clear. Heart is borderline prominent with pulmonary vascularity normal. Pacemaker leads are attached to the right atrium and right ventricle. Patient is status post coronary artery bypass grafting. There is a left atrial appendage clamp. No adenopathy. There is aortic atherosclerosis.  There are old healed rib fractures on the left. There is thoracic lordosis.  IMPRESSION: No edema or airspace opacity. Borderline cardiac enlargement with postoperative changes. No adenopathy.

## 2019-09-05 NOTE — Telephone Encounter (Signed)
Pt called and stated that she needs a referral from Dr. Loletha Grayer to see a lung specialist. Please call to discuss

## 2019-09-05 NOTE — Telephone Encounter (Signed)
Glad to refer her to a pulmonologist (anyone at [facility],; e.g. Dr. Halford Chessman or [facility]).  The chest x-ray does not show findings that suggest COPD as far as I can tell, but it is reasonable to see a pulmonary specialist for her cough.  I do not believe any of the medications that she takes is known for causing coughing.

## 2019-09-05 NOTE — Telephone Encounter (Signed)
Referral has been placed. Patient has been made aware.  °

## 2019-09-05 NOTE — ED Triage Notes (Signed)
Pt c/o cough x 1 year, states cough is now productive since this AM. Taking Sudafed at home. Pt is not COVID vaccinated and declined testing in triage.

## 2019-09-06 ENCOUNTER — Telehealth: Payer: Self-pay | Admitting: Cardiovascular Disease

## 2019-09-06 NOTE — Telephone Encounter (Signed)
Spoke with patient regarding appointment with Dr. Halford Chessman at [facility]---scheduled Monday 11/06/19 at 1:45 pm---arrival time is 1:30 pm at [facility] [address]----patient was given phone # [phone] to call for cancellations.  Will also mail information to patient.  She voiced her understanding.

## 2019-09-07 ENCOUNTER — Other Ambulatory Visit: Payer: Self-pay | Admitting: Physical Medicine and Rehabilitation

## 2019-09-07 ENCOUNTER — Other Ambulatory Visit (HOSPITAL_COMMUNITY): Payer: Self-pay | Admitting: Physical Medicine and Rehabilitation

## 2019-09-07 DIAGNOSIS — M542 Cervicalgia: Secondary | ICD-10-CM

## 2019-09-08 ENCOUNTER — Telehealth (HOSPITAL_COMMUNITY): Payer: Self-pay

## 2019-09-14 ENCOUNTER — Other Ambulatory Visit: Payer: Self-pay

## 2019-09-14 ENCOUNTER — Ambulatory Visit (INDEPENDENT_AMBULATORY_CARE_PROVIDER_SITE_OTHER): Payer: Medicare HMO | Admitting: Emergency Medicine

## 2019-09-14 ENCOUNTER — Encounter: Payer: Self-pay | Admitting: Emergency Medicine

## 2019-09-14 VITALS — BP 118/75 | HR 87 | Temp 98.2°F | Resp 16 | Ht 64.0 in | Wt 158.0 lb

## 2019-09-14 DIAGNOSIS — R05 Cough: Secondary | ICD-10-CM

## 2019-09-14 DIAGNOSIS — J449 Chronic obstructive pulmonary disease, unspecified: Secondary | ICD-10-CM

## 2019-09-14 DIAGNOSIS — E210 Primary hyperparathyroidism: Secondary | ICD-10-CM | POA: Diagnosis not present

## 2019-09-14 DIAGNOSIS — Z789 Other specified health status: Secondary | ICD-10-CM | POA: Diagnosis not present

## 2019-09-14 DIAGNOSIS — R053 Chronic cough: Secondary | ICD-10-CM

## 2019-09-14 MED ORDER — BUDESONIDE-FORMOTEROL FUMARATE 80-4.5 MCG/ACT IN AERO: 2.0000 | INHALATION_SPRAY | Freq: Two times a day (BID) | RESPIRATORY_TRACT | 3 refills | Status: DC

## 2019-09-14 NOTE — Patient Instructions (Signed)
Chronic Obstructive Pulmonary Disease Chronic obstructive pulmonary disease (COPD) is a long-term (chronic) lung problem. When you have COPD, it is hard for air to get in and out of your lungs. Usually the condition gets worse over time, and your lungs will never return to normal. There are things you can do to keep yourself as healthy as possible.  Your doctor may treat your condition with: ? Medicines. ? Oxygen. ? Lung surgery.  Your doctor may also recommend: ? Rehabilitation. This includes steps to make your body work better. It may involve a team of specialists. ? Quitting smoking, if you smoke. ? Exercise and changes to your diet. ? Comfort measures (palliative care). Follow these instructions at home: Medicines  Take over-the-counter and prescription medicines only as told by your doctor.  Talk to your doctor before taking any cough or allergy medicines. You may need to avoid medicines that cause your lungs to be dry. Lifestyle  If you smoke, stop. Smoking makes the problem worse. If you need help quitting, ask your doctor.  Avoid being around things that make your breathing worse. This may include smoke, chemicals, and fumes.  Stay active, but remember to rest as well.  Learn and use tips on how to relax.  Make sure you get enough sleep. Most adults need at least 7 hours of sleep every night.  Eat healthy foods. Eat smaller meals more often. Rest before meals. Controlled breathing Learn and use tips on how to control your breathing as told by your doctor. Try:  Breathing in (inhaling) through your nose for 1 second. Then, pucker your lips and breath out (exhale) through your lips for 2 seconds.  Putting one hand on your belly (abdomen). Breathe in slowly through your nose for 1 second. Your hand on your belly should move out. Pucker your lips and breathe out slowly through your lips. Your hand on your belly should move in as you breathe out.  Controlled coughing Learn  and use controlled coughing to clear mucus from your lungs. Follow these steps: 1. Lean your head a little forward. 2. Breathe in deeply. 3. Try to hold your breath for 3 seconds. 4. Keep your mouth slightly open while coughing 2 times. 5. Spit any mucus out into a tissue. 6. Rest and do the steps again 1 or 2 times as needed. General instructions  Make sure you get all the shots (vaccines) that your doctor recommends. Ask your doctor about a flu shot and a pneumonia shot.  Use oxygen therapy and pulmonary rehabilitation if told by your doctor. If you need home oxygen therapy, ask your doctor if you should buy a tool to measure your oxygen level (oximeter).  Make a COPD action plan with your doctor. This helps you to know what to do if you feel worse than usual.  Manage any other conditions you have as told by your doctor.  Avoid going outside when it is very hot, cold, or humid.  Avoid people who have a sickness you can catch (contagious).  Keep all follow-up visits as told by your doctor. This is important. Contact a doctor if:  You cough up more mucus than usual.  There is a change in the color or thickness of the mucus.  It is harder to breathe than usual.  Your breathing is faster than usual.  You have trouble sleeping.  You need to use your medicines more often than usual.  You have trouble doing your normal activities such as getting dressed   or walking around the house. Get help right away if:  You have shortness of breath while resting.  You have shortness of breath that stops you from: ? Being able to talk. ? Doing normal activities.  Your chest hurts for longer than 5 minutes.  Your skin color is more blue than usual.  Your pulse oximeter shows that you have low oxygen for longer than 5 minutes.  You have a fever.  You feel too tired to breathe normally. Summary  Chronic obstructive pulmonary disease (COPD) is a long-term lung problem.  The way your  lungs work will never return to normal. Usually the condition gets worse over time. There are things you can do to keep yourself as healthy as possible.  Take over-the-counter and prescription medicines only as told by your doctor.  If you smoke, stop. Smoking makes the problem worse. This information is not intended to replace advice given to you by your health care provider. Make sure you discuss any questions you have with your health care provider. Document Revised: 01/08/2017 Document Reviewed: 03/02/2016 Elsevier Patient Education  [place]. Cough, Adult A cough helps to clear your throat and lungs. A cough may be a sign of an illness or another medical condition. An acute cough may only last 2-3 weeks, while a chronic cough may last 8 or more weeks. Many things can cause a cough. They include:  Germs (viruses or bacteria) that attack the airway.  Breathing in things that bother (irritate) your lungs.  Allergies.  Asthma.  Mucus that runs down the back of your throat (postnasal drip).  Smoking.  Acid backing up from the stomach into the tube that moves food from the mouth to the stomach (gastroesophageal reflux).  Some medicines.  Lung problems.  Other medical conditions, such as heart failure or a blood clot in the lung (pulmonary embolism). Follow these instructions at home: Medicines  Take over-the-counter and prescription medicines only as told by your doctor.  Talk with your doctor before you take medicines that stop a cough (coughsuppressants). Lifestyle   Do not smoke, and try not to be around smoke. Do not use any products that contain nicotine or tobacco, such as cigarettes, e-cigarettes, and chewing tobacco. If you need help quitting, ask your doctor.  Drink enough fluid to keep your pee (urine) pale yellow.  Avoid caffeine.  Do not drink alcohol if your doctor tells you not to drink. General instructions   Watch for any changes in your  cough. Tell your doctor about them.  Always cover your mouth when you cough.  Stay away from things that make you cough, such as perfume, candles, campfire smoke, or cleaning products.  If the air is dry, use a cool mist vaporizer or humidifier in your home.  If your cough is worse at night, try using extra pillows to raise your head up higher while you sleep.  Rest as needed.  Keep all follow-up visits as told by your doctor. This is important. Contact a doctor if:  You have new symptoms.  You cough up pus.  Your cough does not get better after 2-3 weeks, or your cough gets worse.  Cough medicine does not help your cough and you are not sleeping well.  You have pain that gets worse or pain that is not helped with medicine.  You have a fever.  You are losing weight and you do not know why.  You have night sweats. Get help right away if:  You cough up blood.  You have trouble breathing.  Your heartbeat is very fast. These symptoms may be an emergency. Do not wait to see if the symptoms will go away. Get medical help right away. Call your local emergency services (911 in the [place] Do not drive yourself to the hospital. Summary  A cough helps to clear your throat and lungs. Many things can cause a cough.  Take over-the-counter and prescription medicines only as told by your doctor.  Always cover your mouth when you cough.  Contact a doctor if you have new symptoms or you have a cough that does not get better or gets worse. This information is not intended to replace advice given to you by your health care provider. Make sure you discuss any questions you have with your health care provider. Document Revised: 02/14/2018 Document Reviewed: 02/14/2018 Elsevier Patient Education  [place].

## 2019-09-14 NOTE — Progress Notes (Signed)
[name] 79 y.o.   Chief Complaint  Patient presents with  . Cough    per patient check CXR and she has an appointment with Pulmonary specialist at [place]: This is a 79 y.o. female complaining of chronic nonproductive cough for at least a year.  Has appointment to see pulmonary doctor next month.  Recently seen at urgent care center and chest x-ray done.  Chest x-ray reviewed with patient in the room and it shows the following: Hyper aerated lungs with increased space between the ribs and flattened diaphragms compatible with COPD changes.  Pacemaker in place.  No signs of congestive heart failure.  HPI   Prior to Admission medications   Medication Sig Start Date End Date Taking? Authorizing Provider  albuterol (VENTOLIN HFA) 108 (90 Base) MCG/ACT inhaler Inhale 1-2 puffs into the lungs every 6 (six) hours as needed for wheezing or shortness of breath. 09/05/19  Yes Darr, Marguerita Beards, PA-C  amLODipine (NORVASC) 5 MG tablet TAKE 1.5 TABLETS (7.5 MG TOTAL) BY MOUTH DAILY. 08/30/19  Yes Lorretta Harp, MD  Cholecalciferol (VITAMIN D3 PO) Take 1 tablet by mouth daily.   Yes [provider]  clopidogrel (PLAVIX) 75 MG tablet Take 1 tablet (75 mg total) by mouth daily with breakfast. 10/08/18  Yes Lorretta Harp, MD  dofetilide (TIKOSYN) 250 MCG capsule Take 1 capsule (250 mcg total) by mouth 2 (two) times daily. 04/06/19  Yes Croitoru, Mihai, MD  furosemide (LASIX) 40 MG tablet Take 40 mg by mouth daily as needed for fluid or edema.   Yes [provider]  levothyroxine (SYNTHROID, LEVOTHROID) 25 MCG tablet Take 25 mcg by mouth daily before breakfast.  04/03/16  Yes [provider]  MAGNESIUM CITRATE PO Take 420 mg by mouth daily.    Yes [provider]  Multiple Vitamins-Minerals (MULTI-VITE) LIQD Take 1 Dose by mouth daily.   Yes [provider]  NIACIN PO Take 1 tablet by mouth daily.   Yes [provider]  nitroGLYCERIN (NITROSTAT) 0.4 MG SL tablet Place 1 tablet (0.4 mg total) under the tongue every 5 (five) minutes x 3 doses as needed for chest pain. 05/15/15  Yes Simmons, Brittainy M, PA-C  Omega-3 Fatty Acids (OMEGA 3 PO) Take 690 mg by mouth daily.   Yes [provider]  TURMERIC PO Take 475 mg by mouth daily.   Yes [provider]  vitamin C (ASCORBIC ACID) 500 MG tablet Take 500 mg by mouth daily.   Yes [provider]  aspirin EC 81 MG tablet Take 81 mg by mouth daily. Patient not taking: Reported on 09/14/2019    [provider]  benzonatate (TESSALON) 100 MG capsule Take 1 capsule (100 mg total) by mouth every 8 (eight) hours. Patient not taking: Reported on 09/14/2019 09/05/19   Darr, Marguerita Beards, PA-C  citalopram (CELEXA) 40 MG tablet Take 1 tablet (40 mg total) by mouth daily. 10/03/18 07/19/19  Horald Pollen, MD    Allergies  Allergen Reactions  . Brilinta [Ticagrelor] Shortness Of Breath  . Clonidine Derivatives Other (See Comments)    Lowers heart rate  . Atorvastatin Other (See Comments)    Possible cause of fatigue/malaise  . Crestor [Rosuvastatin Calcium] Other (See Comments)    Joint/muscle aches  . Exforge [Amlodipine Besylate-Valsartan] Itching and Rash  . Spironolactone Other (See Comments)    Contraindicated with history of hyperkalemia    Patient Active Problem  List   Diagnosis Date Noted  . Hypothyroidism (acquired) 02/08/2018  . Coronary artery disease involving coronary bypass graft of native heart without angina pectoris 10/22/2017  . Dyslipidemia 10/22/2017  . Cerebrovascular small vessel disease 04/27/2017  . Gait abnormality 03/25/2017  . Cerebrovascular disease 03/25/2017  . Claudication in peripheral vascular disease ([place]) 02/15/2017  . Hyperparathyroidism ([place]) 05/13/2016  . Pacemaker 01/10/2016  . Benign neoplasm of ascending colon   . Long term current use of anticoagulant   . CKD (chronic kidney disease),  stage II   . H/O amiodarone therapy 12/04/2015  . Chronic diastolic heart failure ([place]) 10/19/2015  . Atypical atrial flutter ([place])   . Paroxysmal atrial flutter ([place]) 08/23/2015  . Chronic fatigue 06/27/2015  . Stented coronary artery   . S/P CABG x 3 01/04/15 01/10/2015  . CAD (coronary artery disease) 01/04/2015  . PAF (paroxysmal atrial fibrillation) ([place]) 01/22/2012  . Sinus node dysfunction ([place]) 01/22/2012  . S/P placement of cardiac pacemaker, medtronic adapta 01/21/12 01/22/2012  . PVD (peripheral vascular disease), hx stents to bil SFAs 02/2010 01/22/2012  . Hyperlipidemia 12/31/2009  . Anxiety 12/31/2009  . DEPRESSION 12/31/2009  . Essential hypertension 12/31/2009  . Coronary atherosclerosis 12/31/2009  . OSTEOARTHRITIS, HAND 12/31/2009  . [place] DISEASE, CERVICAL 12/31/2009  . [place] DISEASE, LUMBAR 12/31/2009  . UNSPECIFIED URINARY INCONTINENCE 12/31/2009    Past Medical History:  Diagnosis Date  . ANXIETY 12/31/2009  . Aortic insufficiency    a. mod by echo 2017.  Marland Kitchen CAD (coronary artery disease)    a. s/p CABGx3 and LAA clipping in 12/2014, NSTEMI 05/2015 s/p DES to native RCA and SVG-dRCA; occluded ramus-SVG was treated medically). c. neg nuc 10/2015 at [facility].  . Chronic diastolic CHF (congestive heart failure) ([place])   . Chronic fatigue   . CKD (chronic kidney disease), stage II   . DEPRESSION 12/31/2009  . [place] DISEASE, CERVICAL 12/31/2009  . [place] DISEASE, LUMBAR 12/31/2009  . DIVERTICULITIS, HX OF 12/31/2009  . Essential hypertension   . Gait abnormality 03/25/2017  . GLUCOSE INTOLERANCE 12/31/2009  . Habitual alcohol use   . History of stroke    self reports " they say i have had some pin strokes"   . Hypercalcemia   . Hyperkalemia   . Hyperlipidemia 12/31/2009  . Hypothyroidism   . MENOPAUSE, EARLY 12/31/2009  . NSTEMI (non-ST elevated myocardial infarction) ([place]) 05/11/2015  . Orthostatic hypotension   . OSTEOARTHRITIS, HAND   . Pacemaker 01/21/2012   MDT  Adapta dual chamber  . Paroxysmal atrial flutter ([place])   . Persistent atrial fibrillation ([place])   . PVD (peripheral vascular disease), hx stents to bil SFAs 02/2010   . Rash, skin     superficial raised red pencil point sized rash bilateral forearms; states " it started when i started the Plavix "   . Ruptured left breast implant   . Symptomatic sinus bradycardia 01/22/2012  . Syncope    a. 11/2015 ? due to medications.  . Syncope    reports on 05-10-17 " i passed out 2 months ago in the bathroom and broke some ribs"    . Tachy-brady syndrome ([place])    a. s/p MDT PPM 2013.  . Tricuspid regurgitation     Past Surgical History:  Procedure Laterality Date  . ABDOMINAL AORTOGRAM W/LOWER EXTREMITY Bilateral 02/15/2017   Procedure: ABDOMINAL AORTOGRAM W/LOWER EXTREMITY;  Surgeon: Lorretta Harp, MD;  Location: [place] [facility];  Service: Cardiovascular;  Laterality: Bilateral;  bilat  .  ABDOMINAL AORTOGRAM W/LOWER EXTREMITY N/A 10/06/2018   Procedure: ABDOMINAL AORTOGRAM W/LOWER EXTREMITY;  Surgeon: Lorretta Harp, MD;  Location: [place] [facility];  Service: Cardiovascular;  Laterality: N/A;  . Ablation of typical atrial flutter (CTI line)  09/05/2016   Dr Antonieta Pert at [facility]   . [place]   Removed 01/2018  . BREAST IMPLANT REMOVAL Bilateral 01/14/2018   Procedure: REMOVAL BILATERAL BREAST IMPLANTS;  Surgeon: Irene Limbo, MD;  Location: [place];  Service: Plastics;  Laterality: Bilateral;  . CAPSULECTOMY Bilateral 01/14/2018   Procedure: CAPSULECTOMY;  Surgeon: Irene Limbo, MD;  Location: [place];  Service: Plastics;  Laterality: Bilateral;  . CARDIAC CATHETERIZATION N/A 01/01/2015   Procedure: Left Heart Cath and Coronary Angiography;  Surgeon: Jettie Booze, MD;  Location: [place] [facility];  Service: Cardiovascular;  Laterality: N/A;  . CARDIAC CATHETERIZATION N/A 05/12/2015   Procedure: Left Heart Cath and  Coronary Angiography;  Surgeon: Troy Sine, MD;  Location: [place] [facility];  Service: Cardiovascular;  Laterality: N/A;  . CARDIAC CATHETERIZATION N/A 05/12/2015   Procedure: Coronary Stent Intervention;  Surgeon: Troy Sine, MD;  Location: [place] [facility];  Service: Cardiovascular;  Laterality: N/A;  . CARDIOVERSION N/A 02/01/2015   Procedure: CARDIOVERSION;  Surgeon: Lelon Perla, MD;  Location: [facility] ENDOSCOPY;  Service: Cardiovascular;  Laterality: N/A;  . CARDIOVERSION N/A 08/30/2015   Procedure: CARDIOVERSION;  Surgeon: Sanda Klein, MD;  Location: [place] ENDOSCOPY;  Service: Cardiovascular;  Laterality: N/A;  . CARPAL TUNNEL RELEASE  2008   "right hand/thumb; carpal tunnel repair; got rid of arthritis" (01/21/2012)  . CLIPPING OF ATRIAL APPENDAGE N/A 01/04/2015   Procedure: CLIPPING OF ATRIAL APPENDAGE;  Surgeon: Grace Isaac, MD;  Location: [place];  Service: Open Heart Surgery;  Laterality: N/A;  . COLONOSCOPY N/A 12/21/2015   Procedure: COLONOSCOPY;  Surgeon: Milus Banister, MD;  Location: [facility] ENDOSCOPY;  Service: Endoscopy;  Laterality: N/A;  . CORONARY ARTERY BYPASS GRAFT N/A 01/04/2015   Procedure: CORONARY ARTERY BYPASS GRAFTING (CABG) x 3 using left internal mammory artery and greater saphenous vein right leg harvested endoscopically.;  Surgeon: Grace Isaac, MD;  LIMA-LAD, SVG-RI, SVG-PDA  . ESOPHAGOGASTRODUODENOSCOPY (EGD) WITH PROPOFOL N/A 12/18/2015   Procedure: ESOPHAGOGASTRODUODENOSCOPY (EGD) WITH PROPOFOL;  Surgeon: Milus Banister, MD;  Location: [facility] ENDOS[facility];  Service: Endoscopy;  Laterality: N/A;  . FACELIFT, LOWER 2/3  1995   "mini" (01/21/2012)  . GIVENS CAPSULE STUDY N/A 12/21/2015   Procedure: GIVENS CAPSULE STUDY;  Surgeon: Milus Banister, MD;  Location: [facility]OS[facility];  Service: Endoscopy;  Laterality: N/A;  . Lower Arterial Examination  10/28/2011   R. SFA stent mild-moderate mixed density plaque with elevated velocities consistent with 50%  diameter reduction. L. SFA stent moderate mixed denisty plaque at mid to distal level consistent with 50-69% diameter reduction.  . LOWER EXTREMITY ANGIOGRAPHY N/A 02/15/2017   Procedure: LOWER EXTREMITY ANGIOGRAPHY;  Surgeon: Lorretta Harp, MD;  Location: [place] [facility];  Service: Cardiovascular;  Laterality: N/A;  . OOPHORECTOMY  ~1979  . PARATHYROIDECTOMY N/A 05/13/2017   Procedure: PARATHYROIDECTOMY;  Surgeon: Armandina Gemma, MD;  Location: [facility];  Service: General;  Laterality: N/A;  . PARTIAL COLECTOMY  2010  . PERIPHERAL ARTERIAL STENT GRAFT  2012; 2012   "LLE; RLE" (01/21/2012)  . PERIPHERAL VASCULAR BALLOON ANGIOPLASTY Right 02/15/2017   Procedure: PERIPHERAL VASCULAR BALLOON ANGIOPLASTY;  Surgeon: Lorretta Harp, MD;  Location: [place] [facility]  [facility];  Service: Cardiovascular;  Laterality: Right;  SFA    . PERIPHERAL VASCULAR INTERVENTION Right 10/06/2018   Procedure: PERIPHERAL VASCULAR INTERVENTION;  Surgeon: Lorretta Harp, MD;  Location: [place] [facility];  Service: Cardiovascular;  Laterality: Right;  . PERMANENT PACEMAKER INSERTION N/A 01/21/2012   Medtronic Adapta L implanted by Dr Sallyanne Kuster for tachy/brady syndrome  . POSTERIOR CERVICAL LAMINECTOMY  1985  . TEE WITHOUT CARDIOVERSION N/A 01/04/2015   Procedure: TRANSESOPHAGEAL ECHOCARDIOGRAM (TEE);  Surgeon: Grace Isaac, MD;  Location: [place];  Service: Open Heart Surgery;  Laterality: N/A;  . TEE WITHOUT CARDIOVERSION N/A 02/01/2015   Procedure: TRANSESOPHAGEAL ECHOCARDIOGRAM (TEE);  Surgeon: Lelon Perla, MD;  Location: [facility]OSCOPY;  Service: Cardiovascular;  Laterality: N/A;  . TEE WITHOUT CARDIOVERSION N/A 08/30/2015   Procedure: TRANSESOPHAGEAL ECHOCARDIOGRAM (TEE);  Surgeon: Sanda Klein, MD;  Location: [facility] ENDOSCOPY;  Service: Cardiovascular;  Laterality: N/A;  . [place] History   Socioeconomic History  . Marital status: Widowed    Spouse name: Not on file  . Number of  children: 3  . Years of education: Not on file  . Highest education level: Not on file  Occupational History  . 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 Smoking status: Former Smoker    Packs/day: 0.75    Years: 10.00    Pack years: 7.50    Types: Cigarettes    Quit date: 2008    Years since quitting: 13.6  . Smokeless tobacco: Never Used  . Tobacco comment: 01/21/2012 "quit smoking ~ 2002"  Vaping Use  . Vaping Use: Never used  Substance and Sexual Activity  . Alcohol use: Yes    Comment: daily coctail  . Drug use: No  . Sexual activity: Not Currently  Other Topics Concern  . Not on file  Social History Narrative   Lives in [place].  Retired.   Social Determinants of Health   Financial Resource Strain:   . Difficulty of Paying Living Expenses:   Food Insecurity:   . Worried About Charity fundraiser in the Last Year:   . Arboriculturist in the Last Year:   Transportation Needs:   . Film/video editor (Medical):   Marland Kitchen Lack of Transportation (Non-Medical):   Physical Activity:   . Days of Exercise per Week:   . Minutes of Exercise per Session:   Stress:   . Feeling of Stress :   Social Connections:   . Frequency of Communication with Friends and Family:   . Frequency of Social Gatherings with Friends and Family:   . Attends Religious Services:   . Active Member of Clubs or Organizations:   . Attends Archivist Meetings:   Marland Kitchen Marital Status:   Intimate Partner Violence:   . Fear of Current or Ex-Partner:   . Emotionally Abused:   Marland Kitchen Physically Abused:   . Sexually Abused:     Family History  Problem Relation Age of Onset  . Heart disease Mother   . Hypertension Mother   . Stroke Mother   . Stroke Father   . Heart disease Father   . Heart disease Brother   . Hypertension Brother        2 brothers  . CVA Brother        2 brothers  . Heart attack Brother        2 brothers  Review of Systems   Constitutional: Negative.  Negative for chills and fever.  HENT: Negative.  Negative for congestion and sore throat.   Respiratory: Positive for cough. Negative for hemoptysis, sputum production, shortness of breath and wheezing.   Cardiovascular: Negative for chest pain and palpitations.  Gastrointestinal: Negative.  Negative for abdominal pain, diarrhea, nausea and vomiting.  Genitourinary: Negative.  Negative for dysuria and hematuria.  Skin: Negative.  Negative for rash.  Neurological: Negative.  Negative for dizziness and headaches.  All other systems reviewed and are negative.  Vitals:   09/14/19 1356  BP: 118/75  Pulse: 87  Resp: 16  Temp: 98.2 F (36.8 C)  SpO2: 94%     Physical Exam Vitals reviewed.  Constitutional:      Appearance: Normal appearance.  HENT:     Head: Normocephalic.  Eyes:     Extraocular Movements: Extraocular movements intact.     Pupils: Pupils are equal, round, and reactive to light.  Cardiovascular:     Rate and Rhythm: Normal rate and regular rhythm.     Pulses: Normal pulses.     Heart sounds: Normal heart sounds.  Pulmonary:     Effort: Pulmonary effort is normal.     Breath sounds: Normal breath sounds.  Musculoskeletal:        General: Normal range of motion.     Cervical back: Normal range of motion and neck supple.  Skin:    General: Skin is warm and dry.     Capillary Refill: Capillary refill takes less than 2 seconds.  Neurological:     General: No focal deficit present.     Mental Status: She is alert and oriented to person, place, and time.  Psychiatric:        Mood and Affect: Mood normal.        Behavior: Behavior normal.     A total of 30 minutes was spent with the patient, greater than 50% of which was in counseling/coordination of care regarding differential diagnosis of chronic cough including very likely possibility of COPD, review of latest chest x-ray findings compatible with COPD, review of new COPD medication  Symbicort, review of most recent office visit notes, review of most recent blood work results, need for follow-up with a pulmonary specialist, prognosis and need for follow-up.   ASSESSMENT & PLAN: Arnelle was seen today for cough.  Diagnoses and all orders for this visit:  Chronic obstructive pulmonary disease, unspecified COPD type ([place]) -     budesonide-formoterol (SYMBICORT) 80-4.5 MCG/ACT inhaler; Inhale 2 puffs into the lungs 2 (two) times daily.  Statin intolerance  Primary hyperparathyroidism (H[facility])  Chronic cough    Patient Instructions  Chronic Obstructive Pulmonary Disease Chronic obstructive pulmonary disease (COPD) is a long-term (chronic) lung problem. When you have COPD, it is hard for air to get in and out of your lungs. Usually the condition gets worse over time, and your lungs will never return to normal. There are things you can do to keep yourself as healthy as possible.  Your doctor may treat your condition with: ? Medicines. ? Oxygen. ? Lung surgery.  Your doctor may also recommend: ? Rehabilitation. This includes steps to make your body work better. It may involve a team of specialists. ? Quitting smoking, if you smoke. ? Exercise and changes to your diet. ? Comfort measures (palliative care). Follow these instructions at home: Medicines  Take over-the-counter and prescription medicines only as told by your doctor.  Talk to your  doctor before taking any cough or allergy medicines. You may need to avoid medicines that cause your lungs to be dry. Lifestyle  If you smoke, stop. Smoking makes the problem worse. If you need help quitting, ask your doctor.  Avoid being around things that make your breathing worse. This may include smoke, chemicals, and fumes.  Stay active, but remember to rest as well.  Learn and use tips on how to relax.  Make sure you get enough sleep. Most adults need at least 7 hours of sleep every night.  Eat healthy foods. Eat  smaller meals more often. Rest before meals. Controlled breathing Learn and use tips on how to control your breathing as told by your doctor. Try:  Breathing in (inhaling) through your nose for 1 second. Then, pucker your lips and breath out (exhale) through your lips for 2 seconds.  Putting one hand on your belly (abdomen). Breathe in slowly through your nose for 1 second. Your hand on your belly should move out. Pucker your lips and breathe out slowly through your lips. Your hand on your belly should move in as you breathe out.  Controlled coughing Learn and use controlled coughing to clear mucus from your lungs. Follow these steps: 1. Lean your head a little forward. 2. Breathe in deeply. 3. Try to hold your breath for 3 seconds. 4. Keep your mouth slightly open while coughing 2 times. 5. Spit any mucus out into a tissue. 6. Rest and do the steps again 1 or 2 times as needed. General instructions  Make sure you get all the shots (vaccines) that your doctor recommends. Ask your doctor about a flu shot and a pneumonia shot.  Use oxygen therapy and pulmonary rehabilitation if told by your doctor. If you need home oxygen therapy, ask your doctor if you should buy a tool to measure your oxygen level (oximeter).  Make a COPD action plan with your doctor. This helps you to know what to do if you feel worse than usual.  Manage any other conditions you have as told by your doctor.  Avoid going outside when it is very hot, cold, or humid.  Avoid people who have a sickness you can catch (contagious).  Keep all follow-up visits as told by your doctor. This is important. Contact a doctor if:  You cough up more mucus than usual.  There is a change in the color or thickness of the mucus.  It is harder to breathe than usual.  Your breathing is faster than usual.  You have trouble sleeping.  You need to use your medicines more often than usual.  You have trouble doing your normal  activities such as getting dressed or walking around the house. Get help right away if:  You have shortness of breath while resting.  You have shortness of breath that stops you from: ? Being able to talk. ? Doing normal activities.  Your chest hurts for longer than 5 minutes.  Your skin color is more blue than usual.  Your pulse oximeter shows that you have low oxygen for longer than 5 minutes.  You have a fever.  You feel too tired to breathe normally. Summary  Chronic obstructive pulmonary disease (COPD) is a long-term lung problem.  The way your lungs work will never return to normal. Usually the condition gets worse over time. There are things you can do to keep yourself as healthy as possible.  Take over-the-counter and prescription medicines only as told by your doctor.  If you smoke, stop. Smoking makes the problem worse. This information is not intended to replace advice given to you by your health care provider. Make sure you discuss any questions you have with your health care provider. Document Revised: 01/08/2017 Document Reviewed: 03/02/2016 Elsevier Patient Education  [place]. Cough, Adult A cough helps to clear your throat and lungs. A cough may be a sign of an illness or another medical condition. An acute cough may only last 2-3 weeks, while a chronic cough may last 8 or more weeks. Many things can cause a cough. They include:  Germs (viruses or bacteria) that attack the airway.  Breathing in things that bother (irritate) your lungs.  Allergies.  Asthma.  Mucus that runs down the back of your throat (postnasal drip).  Smoking.  Acid backing up from the stomach into the tube that moves food from the mouth to the stomach (gastroesophageal reflux).  Some medicines.  Lung problems.  Other medical conditions, such as heart failure or a blood clot in the lung (pulmonary embolism). Follow these instructions at home: Medicines  Take  over-the-counter and prescription medicines only as told by your doctor.  Talk with your doctor before you take medicines that stop a cough (coughsuppressants). Lifestyle   Do not smoke, and try not to be around smoke. Do not use any products that contain nicotine or tobacco, such as cigarettes, e-cigarettes, and chewing tobacco. If you need help quitting, ask your doctor.  Drink enough fluid to keep your pee (urine) pale yellow.  Avoid caffeine.  Do not drink alcohol if your doctor tells you not to drink. General instructions   Watch for any changes in your cough. Tell your doctor about them.  Always cover your mouth when you cough.  Stay away from things that make you cough, such as perfume, candles, campfire smoke, or cleaning products.  If the air is dry, use a cool mist vaporizer or humidifier in your home.  If your cough is worse at night, try using extra pillows to raise your head up higher while you sleep.  Rest as needed.  Keep all follow-up visits as told by your doctor. This is important. Contact a doctor if:  You have new symptoms.  You cough up pus.  Your cough does not get better after 2-3 weeks, or your cough gets worse.  Cough medicine does not help your cough and you are not sleeping well.  You have pain that gets worse or pain that is not helped with medicine.  You have a fever.  You are losing weight and you do not know why.  You have night sweats. Get help right away if:  You cough up blood.  You have trouble breathing.  Your heartbeat is very fast. These symptoms may be an emergency. Do not wait to see if the symptoms will go away. Get medical help right away. Call your local emergency services (911 in the [place] Do not drive yourself to the hospital. Summary  A cough helps to clear your throat and lungs. Many things can cause a cough.  Take over-the-counter and prescription medicines only as told by your doctor.  Always cover your  mouth when you cough.  Contact a doctor if you have new symptoms or you have a cough that does not get better or gets worse. This information is not intended to replace advice given to you by your health care provider. Make sure you discuss any questions you have with your health  care provider. Document Revised: 02/14/2018 Document Reviewed: 02/14/2018 Elsevier Patient Education  2020 Elsevier Inc.      Agustina Caroli, MD [facility]

## 2019-09-15 DIAGNOSIS — G5602 Carpal tunnel syndrome, left upper limb: Secondary | ICD-10-CM | POA: Diagnosis not present

## 2019-09-25 DIAGNOSIS — R262 Difficulty in walking, not elsewhere classified: Secondary | ICD-10-CM | POA: Diagnosis not present

## 2019-09-25 DIAGNOSIS — M6281 Muscle weakness (generalized): Secondary | ICD-10-CM | POA: Diagnosis not present

## 2019-09-29 DIAGNOSIS — M6281 Muscle weakness (generalized): Secondary | ICD-10-CM | POA: Diagnosis not present

## 2019-09-29 DIAGNOSIS — R262 Difficulty in walking, not elsewhere classified: Secondary | ICD-10-CM | POA: Diagnosis not present

## 2019-10-04 DIAGNOSIS — M47896 Other spondylosis, lumbar region: Secondary | ICD-10-CM | POA: Diagnosis not present

## 2019-10-04 DIAGNOSIS — R262 Difficulty in walking, not elsewhere classified: Secondary | ICD-10-CM | POA: Diagnosis not present

## 2019-10-04 DIAGNOSIS — M6281 Muscle weakness (generalized): Secondary | ICD-10-CM | POA: Diagnosis not present

## 2019-10-04 DIAGNOSIS — M47892 Other spondylosis, cervical region: Secondary | ICD-10-CM | POA: Diagnosis not present

## 2019-10-05 ENCOUNTER — Other Ambulatory Visit: Payer: Self-pay

## 2019-10-05 ENCOUNTER — Encounter: Payer: Self-pay | Admitting: Family Medicine

## 2019-10-05 ENCOUNTER — Telehealth (INDEPENDENT_AMBULATORY_CARE_PROVIDER_SITE_OTHER): Payer: Medicare HMO | Admitting: Family Medicine

## 2019-10-05 DIAGNOSIS — Z8679 Personal history of other diseases of the circulatory system: Secondary | ICD-10-CM

## 2019-10-05 DIAGNOSIS — R0781 Pleurodynia: Secondary | ICD-10-CM

## 2019-10-05 DIAGNOSIS — R05 Cough: Secondary | ICD-10-CM

## 2019-10-05 DIAGNOSIS — J449 Chronic obstructive pulmonary disease, unspecified: Secondary | ICD-10-CM

## 2019-10-05 DIAGNOSIS — R059 Cough, unspecified: Secondary | ICD-10-CM

## 2019-10-05 MED ORDER — BENZONATATE 100 MG PO CAPS: 100.0000 mg | ORAL_CAPSULE | Freq: Three times a day (TID) | ORAL | 0 refills | Status: DC

## 2019-10-05 MED ORDER — DOXYCYCLINE HYCLATE 100 MG PO TABS: 100.0000 mg | ORAL_TABLET | Freq: Two times a day (BID) | ORAL | 0 refills | Status: DC

## 2019-10-05 NOTE — Progress Notes (Addendum)
Patient ID: [name], female    DOB: [birth year]  Age: 79 y.o. MRN: [id number]  Chief Complaint  Patient presents with  . Cough    chronic cough has worsened/ recent copd dx /fatigue , SOB - 2 yrs,  requesting abx / appt with pulmonary 9/27     Subjective:   Telemedicine visit: Patient identified herself appropriately, understands she will be billed, and is at home when she can talk safely.  I spoke from the office.  Has been having worsening shortness of breath and coughing.  She saw Dr. Mitchel Honour recently.  She does not feel like the inhalers helped much.  She does not smoke, but 30 or 40 years ago did smoke a little bit.  She has a history of coronary artery disease and has had surgery on her heart.  She is not having ankle edema.  She is not having much productivity.  She is having a temperature around 100.  She feels weak and tired.  She has not had her COVID-19 vaccination.  The 2 people that come in her house, her female friend and her daughter, have both had Covid vaccines.  Current allergies, medications, problem list, past/family and social histories reviewed.  Objective:  There were no vitals taken for this visit.  Telephone visit, not examined  Assessment & Plan:   Assessment: 1. Cough   2. Chronic obstructive pulmonary disease, unspecified COPD type ([place])   3. History of coronary artery disease   4. Rib pain       Plan: See instructions  No orders of the defined types were placed in this encounter.   No orders of the defined types were placed in this encounter.        Patient Instructions     Drink plenty of water  Try taking one half of the Lasix daily (furosemide) for a few days and see if that helps  Use the benzonatate cough pills as needed for cough  Take doxycycline 100 mg 1 twice daily for possible infection  Get a Covid testing done.  I suggest [facility] in [place] as being a place that seems to be efficient with  these.  Get a pulse oximeter for monitoring your oxygen level at home  In the event of acute worsening go to an emergency room or urgent care.  Return here as needed also.  Keep your appointment with your pulmonologist.  If you get a negative Covid test you might call the office and tell them you are willing to be seen on an acute basis if they had a cancellation.   If you have lab work done today you will be contacted with your lab results within the next 2 weeks.  If you have not heard from [place] then please contact us. The fastest way to get your results is to register for My Chart.   IF you received an x-ray today, you will receive an invoice from [facility]. Please contact [facility] at [phone] with questions or concerns regarding your invoice.   IF you received labwork today, you will receive an invoice from [facility]. Please contact [facility] at [phone] with questions or concerns regarding your invoice.   Our billing staff will not be able to assist you with questions regarding bills from these companies.  You will be contacted with the lab results as soon as they are available. The fastest way to get your results is to activate your My Chart account. Instructions are located on  the last page of this paperwork. If you have not heard from [place] regarding the results in 2 weeks, please contact this office.         Return if symptoms worsen or fail to improve. 25 minutes. Ruben Reason, MD 10/05/2019

## 2019-10-05 NOTE — Patient Instructions (Addendum)
   Drink plenty of water  Try taking one half of the Lasix daily (furosemide) for a few days and see if that helps  Use the benzonatate cough pills as needed for cough  Take doxycycline 100 mg 1 twice daily for possible infection  Get a Covid testing done.  I suggest [facility] in [place] as being a place that seems to be efficient with these.  Get a pulse oximeter for monitoring your oxygen level at home  In the event of acute worsening go to an emergency room or urgent care.  Return here as needed also.  Keep your appointment with your pulmonologist.  If you get a negative Covid test you might call the office and tell them you are willing to be seen on an acute basis if they had a cancellation.   If you have lab work done today you will be contacted with your lab results within the next 2 weeks.  If you have not heard from [place] then please contact us. The fastest way to get your results is to register for My Chart.   IF you received an x-ray today, you will receive an invoice from [facility]. Please contact [facility] at [phone] with questions or concerns regarding your invoice.   IF you received labwork today, you will receive an invoice from [place]. Please contact [facility] at [phone] with questions or concerns regarding your invoice.   Our billing staff will not be able to assist you with questions regarding bills from these companies.  You will be contacted with the lab results as soon as they are available. The fastest way to get your results is to activate your My Chart account. Instructions are located on the last page of this paperwork. If you have not heard from [place] regarding the results in 2 weeks, please contact this office.

## 2019-10-07 ENCOUNTER — Other Ambulatory Visit: Payer: Self-pay | Admitting: Emergency Medicine

## 2019-10-07 ENCOUNTER — Other Ambulatory Visit: Payer: Self-pay | Admitting: Cardiovascular Disease

## 2019-10-07 NOTE — Telephone Encounter (Signed)
Requested medication (s) are due for refill today: yes  Requested medication (s) are on the active medication list: med expired  Last refill:  10/03/18  Future visit scheduled: no  Notes to clinic:  med expired 07/19/19   Requested Prescriptions  Pending Prescriptions Disp Refills   citalopram (CELEXA) 40 MG tablet [Pharmacy Med Name: CITALOPRAM HBR 40 MG TABLET] 90 tablet 3    Sig: TAKE 1 TABLET BY MOUTH EVERY DAY      Psychiatry:  Antidepressants - SSRI Passed - 10/07/2019  9:00 AM      Passed - Completed PHQ-2 or PHQ-9 in the last 360 days.      Passed - Valid encounter within last 6 months    Recent Outpatient Visits           2 days ago Cough   Primary Care at [facility], [place]enton Malling, MD   3 weeks ago Chronic obstructive pulmonary disease, unspecified COPD type [facility])   Primary Care at [facility], [place], [place]   1 year ago Severe episode of recurrent major depressive disorder, without psychotic features [facility])   Primary Care at [place], [place], MD   1 year ago Severe episode of recurrent major depressive disorder, without psychotic features [facility])   Primary Care at [facility], Ines Bloomer, MD   1 year ago Cough   Primary Care at [facility], Ines Bloomer, MD       Future Appointments             In 3 months Gwenlyn Found, Pearletha [place], MD [facility], [facility]

## 2019-10-09 ENCOUNTER — Telehealth: Payer: Self-pay | Admitting: Emergency Medicine

## 2019-10-09 NOTE — Telephone Encounter (Signed)
Pt called and stated that she is needing stronger medication pt does have covid, and stated she needs something to treat that better. Pt want to talk to provider regarding this. Please advise.

## 2019-10-10 ENCOUNTER — Other Ambulatory Visit: Payer: Self-pay

## 2019-10-10 ENCOUNTER — Inpatient Hospital Stay (HOSPITAL_COMMUNITY)
Admission: EM | Admit: 2019-10-10 | Discharge: 2019-10-16 | DRG: 177 | Disposition: A | Discharge status: Home / Self Care | Payer: Medicare HMO | Attending: Internal Medicine | Admitting: Internal Medicine

## 2019-10-10 ENCOUNTER — Emergency Department (HOSPITAL_COMMUNITY): Payer: Medicare HMO

## 2019-10-10 ENCOUNTER — Telehealth (INDEPENDENT_AMBULATORY_CARE_PROVIDER_SITE_OTHER): Payer: Medicare HMO | Admitting: Emergency Medicine

## 2019-10-10 ENCOUNTER — Telehealth: Payer: Self-pay | Admitting: *Deleted

## 2019-10-10 ENCOUNTER — Encounter (HOSPITAL_COMMUNITY): Payer: Self-pay | Admitting: Emergency Medicine

## 2019-10-10 ENCOUNTER — Encounter: Payer: Self-pay | Admitting: Emergency Medicine

## 2019-10-10 DIAGNOSIS — J9600 Acute respiratory failure, unspecified whether with hypoxia or hypercapnia: Secondary | ICD-10-CM | POA: Diagnosis present

## 2019-10-10 DIAGNOSIS — J1282 Pneumonia due to coronavirus disease 2019: Secondary | ICD-10-CM | POA: Diagnosis not present

## 2019-10-10 DIAGNOSIS — N1831 Chronic kidney disease, stage 3a: Secondary | ICD-10-CM | POA: Diagnosis present

## 2019-10-10 DIAGNOSIS — I2510 Atherosclerotic heart disease of native coronary artery without angina pectoris: Secondary | ICD-10-CM | POA: Diagnosis present

## 2019-10-10 DIAGNOSIS — I10 Essential (primary) hypertension: Secondary | ICD-10-CM | POA: Diagnosis present

## 2019-10-10 DIAGNOSIS — I130 Hypertensive heart and chronic kidney disease with heart failure and stage 1 through stage 4 chronic kidney disease, or unspecified chronic kidney disease: Secondary | ICD-10-CM | POA: Diagnosis present

## 2019-10-10 DIAGNOSIS — R6889 Other general symptoms and signs: Secondary | ICD-10-CM | POA: Diagnosis not present

## 2019-10-10 DIAGNOSIS — N182 Chronic kidney disease, stage 2 (mild): Secondary | ICD-10-CM | POA: Diagnosis present

## 2019-10-10 DIAGNOSIS — Z7902 Long term (current) use of antithrombotics/antiplatelets: Secondary | ICD-10-CM | POA: Diagnosis not present

## 2019-10-10 DIAGNOSIS — E039 Hypothyroidism, unspecified: Secondary | ICD-10-CM | POA: Diagnosis present

## 2019-10-10 DIAGNOSIS — I4819 Other persistent atrial fibrillation: Secondary | ICD-10-CM | POA: Diagnosis present

## 2019-10-10 DIAGNOSIS — U071 COVID-19: Secondary | ICD-10-CM | POA: Diagnosis not present

## 2019-10-10 DIAGNOSIS — Z87891 Personal history of nicotine dependence: Secondary | ICD-10-CM | POA: Diagnosis not present

## 2019-10-10 DIAGNOSIS — Z955 Presence of coronary angioplasty implant and graft: Secondary | ICD-10-CM

## 2019-10-10 DIAGNOSIS — Z951 Presence of aortocoronary bypass graft: Secondary | ICD-10-CM

## 2019-10-10 DIAGNOSIS — J449 Chronic obstructive pulmonary disease, unspecified: Secondary | ICD-10-CM | POA: Diagnosis not present

## 2019-10-10 DIAGNOSIS — I959 Hypotension, unspecified: Secondary | ICD-10-CM | POA: Diagnosis not present

## 2019-10-10 DIAGNOSIS — Z950 Presence of cardiac pacemaker: Secondary | ICD-10-CM

## 2019-10-10 DIAGNOSIS — J9601 Acute respiratory failure with hypoxia: Secondary | ICD-10-CM | POA: Diagnosis not present

## 2019-10-10 DIAGNOSIS — E871 Hypo-osmolality and hyponatremia: Secondary | ICD-10-CM | POA: Diagnosis not present

## 2019-10-10 DIAGNOSIS — Z79899 Other long term (current) drug therapy: Secondary | ICD-10-CM | POA: Diagnosis not present

## 2019-10-10 DIAGNOSIS — I252 Old myocardial infarction: Secondary | ICD-10-CM | POA: Diagnosis not present

## 2019-10-10 DIAGNOSIS — F419 Anxiety disorder, unspecified: Secondary | ICD-10-CM | POA: Diagnosis present

## 2019-10-10 DIAGNOSIS — Z79890 Hormone replacement therapy (postmenopausal): Secondary | ICD-10-CM

## 2019-10-10 DIAGNOSIS — Z8673 Personal history of transient ischemic attack (TIA), and cerebral infarction without residual deficits: Secondary | ICD-10-CM

## 2019-10-10 DIAGNOSIS — R0602 Shortness of breath: Secondary | ICD-10-CM | POA: Diagnosis not present

## 2019-10-10 DIAGNOSIS — E785 Hyperlipidemia, unspecified: Secondary | ICD-10-CM | POA: Diagnosis present

## 2019-10-10 DIAGNOSIS — I5032 Chronic diastolic (congestive) heart failure: Secondary | ICD-10-CM | POA: Diagnosis not present

## 2019-10-10 DIAGNOSIS — R0689 Other abnormalities of breathing: Secondary | ICD-10-CM | POA: Diagnosis not present

## 2019-10-10 DIAGNOSIS — R0902 Hypoxemia: Secondary | ICD-10-CM | POA: Diagnosis not present

## 2019-10-10 DIAGNOSIS — R55 Syncope and collapse: Secondary | ICD-10-CM | POA: Diagnosis not present

## 2019-10-10 DIAGNOSIS — I739 Peripheral vascular disease, unspecified: Secondary | ICD-10-CM | POA: Diagnosis present

## 2019-10-10 DIAGNOSIS — I77810 Thoracic aortic ectasia: Secondary | ICD-10-CM | POA: Diagnosis not present

## 2019-10-10 DIAGNOSIS — Z7982 Long term (current) use of aspirin: Secondary | ICD-10-CM | POA: Diagnosis not present

## 2019-10-10 DIAGNOSIS — J189 Pneumonia, unspecified organism: Secondary | ICD-10-CM | POA: Diagnosis not present

## 2019-10-10 DIAGNOSIS — I700 Atherosclerosis of aorta: Secondary | ICD-10-CM | POA: Diagnosis not present

## 2019-10-10 LAB — COMPREHENSIVE METABOLIC PANEL
ALT: 15 U/L (ref 0–44)
AST: 26 U/L (ref 15–41)
Albumin: 3.5 g/dL (ref 3.5–5.0)
Alkaline Phosphatase: 78 U/L (ref 38–126)
Anion gap: 9 (ref 5–15)
BUN: 24 mg/dL — ABNORMAL HIGH (ref 8–23)
CO2: 21 mmol/L — ABNORMAL LOW (ref 22–32)
Calcium: 8 mg/dL — ABNORMAL LOW (ref 8.9–10.3)
Chloride: 97 mmol/L — ABNORMAL LOW (ref 98–111)
Creatinine, Ser: 0.98 mg/dL (ref 0.44–1.00)
GFR calc Af Amer: >60 mL/min (ref >60)
GFR calc non Af Amer: 55 mL/min — ABNORMAL LOW (ref >60)
Glucose, Bld: 137 mg/dL — ABNORMAL HIGH (ref 70–99)
Potassium: 3.8 mmol/L (ref 3.5–5.1)
Sodium: 127 mmol/L — ABNORMAL LOW (ref 135–145)
Total Bilirubin: 0.4 mg/dL (ref 0.3–1.2)
Total Protein: 6.4 g/dL — ABNORMAL LOW (ref 6.5–8.1)

## 2019-10-10 LAB — CBC WITH DIFFERENTIAL/PLATELET
Abs Immature Granulocytes: 0.01 10*3/uL (ref 0.00–0.07)
Basophils Absolute: 0 10*3/uL (ref 0.0–0.1)
Basophils Relative: 0 %
Eosinophils Absolute: 0 10*3/uL (ref 0.0–0.5)
Eosinophils Relative: 0 %
HCT: 39 % (ref 36.0–46.0)
Hemoglobin: 13.4 g/dL (ref 12.0–15.0)
Immature Granulocytes: 0 %
Lymphocytes Relative: 19 %
Lymphs Abs: 0.8 10*3/uL (ref 0.7–4.0)
MCH: 30.7 pg (ref 26.0–34.0)
MCHC: 34.4 g/dL (ref 30.0–36.0)
MCV: 89.4 fL (ref 80.0–100.0)
Monocytes Absolute: 0.4 10*3/uL (ref 0.1–1.0)
Monocytes Relative: 9 %
Neutro Abs: 2.9 10*3/uL (ref 1.7–7.7)
Neutrophils Relative %: 72 %
Platelets: 140 10*3/uL — ABNORMAL LOW (ref 150–400)
RBC: 4.36 MIL/uL (ref 3.87–5.11)
RDW: 12.3 % (ref 11.5–15.5)
WBC: 4.1 10*3/uL (ref 4.0–10.5)
nRBC: 0 % (ref 0.0–0.2)

## 2019-10-10 LAB — LACTIC ACID, PLASMA
Lactic Acid, Venous: 1 mmol/L (ref 0.5–1.9)
Lactic Acid, Venous: 0.8 mmol/L (ref 0.5–1.9)

## 2019-10-10 LAB — LACTATE DEHYDROGENASE: LDH: 169 U/L (ref 98–192)

## 2019-10-10 LAB — D-DIMER, QUANTITATIVE: D-Dimer, Quant: 1.09 ug/mL-FEU — ABNORMAL HIGH (ref 0.00–0.50)

## 2019-10-10 LAB — FIBRINOGEN: Fibrinogen: 512 mg/dL — ABNORMAL HIGH (ref 210–475)

## 2019-10-10 LAB — FERRITIN: Ferritin: 343 ng/mL — ABNORMAL HIGH (ref 11–307)

## 2019-10-10 LAB — C-REACTIVE PROTEIN: CRP: 6.1 mg/dL — ABNORMAL HIGH (ref <1.0)

## 2019-10-10 LAB — PROCALCITONIN: Procalcitonin: <0.1 ng/mL

## 2019-10-10 LAB — SARS CORONAVIRUS 2 BY RT PCR (HOSPITAL ORDER, PERFORMED IN ~~LOC~~ HOSPITAL LAB): SARS Coronavirus 2: POSITIVE — AB

## 2019-10-10 LAB — TRIGLYCERIDES: Triglycerides: 83 mg/dL (ref <150)

## 2019-10-10 MED ORDER — ONDANSETRON HCL 4 MG/2ML IJ SOLN: 4.0000 mg | Freq: Four times a day (QID) | INTRAMUSCULAR | Status: DC | PRN

## 2019-10-10 MED ORDER — SODIUM CHLORIDE 0.45 % IV SOLN: INTRAVENOUS | Status: DC

## 2019-10-10 MED ORDER — ZINC SULFATE 220 (50 ZN) MG PO CAPS
220.0000 mg | ORAL_CAPSULE | Freq: Every day | ORAL | Status: DC
  Administered 2019-10-11 – 2019-10-16 (×6): 220 mg via ORAL
  Filled 2019-10-10 (×6): qty 1

## 2019-10-10 MED ORDER — ACETAMINOPHEN 325 MG PO TABS
650.0000 mg | ORAL_TABLET | Freq: Four times a day (QID) | ORAL | Status: DC | PRN
  Administered 2019-10-11 – 2019-10-15 (×5): 650 mg via ORAL
  Filled 2019-10-10 (×5): qty 2

## 2019-10-10 MED ORDER — DEXAMETHASONE SODIUM PHOSPHATE 10 MG/ML IJ SOLN
6.0000 mg | INTRAMUSCULAR | Status: DC
  Administered 2019-10-10: 6 mg via INTRAVENOUS
  Filled 2019-10-10: qty 1

## 2019-10-10 MED ORDER — ASCORBIC ACID 500 MG PO TABS
500.0000 mg | ORAL_TABLET | Freq: Every day | ORAL | Status: DC
  Administered 2019-10-11 – 2019-10-16 (×6): 500 mg via ORAL
  Filled 2019-10-10 (×6): qty 1

## 2019-10-10 MED ORDER — ALBUTEROL SULFATE HFA 108 (90 BASE) MCG/ACT IN AERS
2.0000 | INHALATION_SPRAY | Freq: Four times a day (QID) | RESPIRATORY_TRACT | Status: DC
  Administered 2019-10-11 – 2019-10-16 (×23): 2 via RESPIRATORY_TRACT
  Filled 2019-10-10 (×2): qty 6.7

## 2019-10-10 MED ORDER — HYDROCOD POLST-CPM POLST ER 10-8 MG/5ML PO SUER
5.0000 mL | Freq: Two times a day (BID) | ORAL | Status: DC | PRN
  Administered 2019-10-11 – 2019-10-15 (×4): 5 mL via ORAL
  Filled 2019-10-10 (×4): qty 5

## 2019-10-10 MED ORDER — SODIUM CHLORIDE 0.9 % IV SOLN
100.0000 mg | Freq: Every day | INTRAVENOUS | Status: AC
  Administered 2019-10-12 – 2019-10-15 (×4): 100 mg via INTRAVENOUS
  Filled 2019-10-10 (×4): qty 20

## 2019-10-10 MED ORDER — ONDANSETRON HCL 4 MG PO TABS: 4.0000 mg | ORAL_TABLET | Freq: Four times a day (QID) | ORAL | Status: DC | PRN

## 2019-10-10 MED ORDER — ENOXAPARIN SODIUM 40 MG/0.4ML ~~LOC~~ SOLN
40.0000 mg | SUBCUTANEOUS | Status: DC
  Administered 2019-10-11 – 2019-10-16 (×6): 40 mg via SUBCUTANEOUS
  Filled 2019-10-10 (×6): qty 0.4

## 2019-10-10 MED ORDER — SODIUM CHLORIDE 0.9 % IV SOLN
500.0000 mg | INTRAVENOUS | Status: DC
  Administered 2019-10-10: 500 mg via INTRAVENOUS
  Filled 2019-10-10: qty 500

## 2019-10-10 MED ORDER — SODIUM CHLORIDE 0.9 % IV SOLN
200.0000 mg | Freq: Once | INTRAVENOUS | Status: AC
  Administered 2019-10-10: 200 mg via INTRAVENOUS
  Filled 2019-10-10: qty 200

## 2019-10-10 MED ORDER — GUAIFENESIN-DM 100-10 MG/5ML PO SYRP
10.0000 mL | ORAL_SOLUTION | ORAL | Status: DC | PRN
  Administered 2019-10-11 – 2019-10-15 (×2): 10 mL via ORAL
  Filled 2019-10-10 (×2): qty 10

## 2019-10-10 MED ORDER — IOHEXOL 350 MG/ML SOLN
80.0000 mL | Freq: Once | INTRAVENOUS | Status: AC | PRN
  Administered 2019-10-10: 80 mL via INTRAVENOUS

## 2019-10-10 NOTE — ED Provider Notes (Signed)
[facility] DEPT Provider Note   CSN: [id number] Arrival date & time: 10/10/19  1821     History Chief Complaint  Patient presents with  . covid positive  . Cough    [name] is a 79 y.o. female.  HPI    Unvaccinated elderly female presents with cough, fatigue, nausea, weakness. Patient began this illness little more than 1 week ago, was diagnosed with coronavirus 1 week ago.  She notes that she was recovering uneventfully until recently when she became more dyspneic, weaker than usual. She has not received any novel therapy. She denies substantial other medical problems, states that she is well prior to contracting the illness. It is unclear why she did not receive her coronavirus vaccine. Per EMS the patient was hypoxic on room air, improved with supplemental oxygen.  Past Medical History:  Diagnosis Date  . ANXIETY 12/31/2009  . Aortic insufficiency    a. mod by echo 2017.  [place] Kitchen CAD (coronary artery disease)    a. s/p CABGx3 and LAA clipping in 12/2014, NSTEMI 05/2015 s/p DES to native RCA and SVG-dRCA; occluded ramus-SVG was treated medically). c. neg nuc 10/2015 at [facility].  . Chronic diastolic CHF (congestive heart failure) ([place])   . Chronic fatigue   . CKD (chronic kidney disease), stage II   . DEPRESSION 12/31/2009  . [place] DISEASE, CERVICAL 12/31/2009  . [place] DISEASE, LUMBAR 12/31/2009  . DIVERTICULITIS, HX OF 12/31/2009  . Essential hypertension   . Gait abnormality 03/25/2017  . GLUCOSE INTOLERANCE 12/31/2009  . Habitual alcohol use   . History of stroke    self reports " they say i have had some pin strokes"   . Hypercalcemia   . Hyperkalemia   . Hyperlipidemia 12/31/2009  . Hypothyroidism   . MENOPAUSE, EARLY 12/31/2009  . NSTEMI (non-ST elevated myocardial infarction) ([place]) 05/11/2015  . Orthostatic hypotension   . OSTEOARTHRITIS, HAND   . Pacemaker 01/21/2012   MDT Adapta dual chamber  . Paroxysmal atrial flutter ([place])    . Persistent atrial fibrillation ([place])   . PVD (peripheral vascular disease), hx stents to bil SFAs 02/2010   . Rash, skin     superficial raised red pencil point sized rash bilateral forearms; states " it started when i started the Plavix "   . Ruptured left breast implant   . Symptomatic sinus bradycardia 01/22/2012  . Syncope    a. 11/2015 ? due to medications.  . Syncope    reports on 05-10-17 " i passed out 2 months ago in the bathroom and broke some ribs"    . Tachy-brady syndrome ([place])    a. s/p MDT PPM 2013.  . Tricuspid regurgitation     Patient Active Problem List   Diagnosis Date Noted  . Hypothyroidism (acquired) 02/08/2018  . Coronary artery disease involving coronary bypass graft of native heart without angina pectoris 10/22/2017  . Dyslipidemia 10/22/2017  . Cerebrovascular small vessel disease 04/27/2017  . Gait abnormality 03/25/2017  . Cerebrovascular disease 03/25/2017  . Claudication in peripheral vascular disease ([place]) 02/15/2017  . Hyperparathyroidism ([place]) 05/13/2016  . Pacemaker 01/10/2016  . Benign neoplasm of ascending colon   . Long term current use of anticoagulant   . CKD (chronic kidney disease), stage II   . H/O amiodarone therapy 12/04/2015  . Chronic diastolic heart failure ([place]) 10/19/2015  . Atypical atrial flutter ([place])   . Paroxysmal atrial flutter ([place]) 08/23/2015  . Chronic fatigue 06/27/2015  . Stented coronary artery   .  S/P CABG x 3 01/04/15 01/10/2015  . CAD (coronary artery disease) 01/04/2015  . PAF (paroxysmal atrial fibrillation) ([place]) 01/22/2012  . Sinus node dysfunction ([place]) 01/22/2012  . S/P placement of cardiac pacemaker, medtronic adapta 01/21/12 01/22/2012  . PVD (peripheral vascular disease), hx stents to bil SFAs 02/2010 01/22/2012  . Hyperlipidemia 12/31/2009  . Anxiety 12/31/2009  . DEPRESSION 12/31/2009  . Essential hypertension 12/31/2009  . Coronary atherosclerosis 12/31/2009  . OSTEOARTHRITIS, HAND 12/31/2009    . [place] DISEASE, CERVICAL 12/31/2009  . [place] DISEASE, LUMBAR 12/31/2009  . UNSPECIFIED URINARY INCONTINENCE 12/31/2009    Past Surgical History:  Procedure Laterality Date  . ABDOMINAL AORTOGRAM W/LOWER EXTREMITY Bilateral 02/15/2017   Procedure: ABDOMINAL AORTOGRAM W/LOWER EXTREMITY;  Surgeon: Lorretta Harp, MD;  Location: [facility];  Service: Cardiovascular;  Laterality: Bilateral;  bilat  . ABDOMINAL AORTOGRAM W/LOWER EXTREMITY N/A 10/06/2018   Procedure: ABDOMINAL AORTOGRAM W/LOWER EXTREMITY;  Surgeon: Lorretta Harp, MD;  Location: [facility];  Service: Cardiovascular;  Laterality: N/A;  . Ablation of typical atrial flutter (CTI line)  09/05/2016   Dr Antonieta Pert at [facility]   . [place]   Removed 01/2018  . BREAST IMPLANT REMOVAL Bilateral 01/14/2018   Procedure: REMOVAL BILATERAL BREAST IMPLANTS;  Surgeon: Irene Limbo, MD;  Location: [facility];  Service: Plastics;  Laterality: Bilateral;  . CAPSULECTOMY Bilateral 01/14/2018   Procedure: CAPSULECTOMY;  Surgeon: Irene Limbo, MD;  Location: [facility];  Service: Plastics;  Laterality: Bilateral;  . CARDIAC CATHETERIZATION N/A 01/01/2015   Procedure: Left Heart Cath and Coronary Angiography;  Surgeon: Jettie Booze, MD;  Location: [facility];  Service: Cardiovascular;  Laterality: N/A;  . CARDIAC CATHETERIZATION N/A 05/12/2015   Procedure: Left Heart Cath and Coronary Angiography;  Surgeon: Troy Sine, MD;  Location: [facility];  Service: Cardiovascular;  Laterality: N/A;  . CARDIAC CATHETERIZATION N/A 05/12/2015   Procedure: Coronary Stent Intervention;  Surgeon: Troy Sine, MD;  Location: [facility];  Service: Cardiovascular;  Laterality: N/A;  . CARDIOVERSION N/A 02/01/2015   Procedure: CARDIOVERSION;  Surgeon: Lelon Perla, MD;  Location: [facility] ENDOSCOPY;  Service: Cardiovascular;  Laterality: N/A;  . CARDIOVERSION  N/A 08/30/2015   Procedure: CARDIOVERSION;  Surgeon: Sanda Klein, MD;  Location: [facility] ENDOSCOPY;  Service: Cardiovascular;  Laterality: N/A;  . CARPAL TUNNEL RELEASE  2008   "right hand/thumb; carpal tunnel repair; got rid of arthritis" (01/21/2012)  . CLIPPING OF ATRIAL APPENDAGE N/A 01/04/2015   Procedure: CLIPPING OF ATRIAL APPENDAGE;  Surgeon: Grace Isaac, MD;  Location: [facility];  Service: Open Heart Surgery;  Laterality: N/A;  . COLONOSCOPY N/A 12/21/2015   Procedure: COLONOSCOPY;  Surgeon: Milus Banister, MD;  Location: [facility] ENDOSCOPY;  Service: Endoscopy;  Laterality: N/A;  . CORONARY ARTERY BYPASS GRAFT N/A 01/04/2015   Procedure: CORONARY ARTERY BYPASS GRAFTING (CABG) x 3 using left internal mammory artery and greater saphenous vein right leg harvested endoscopically.;  Surgeon: Grace Isaac, MD;  LIMA-LAD, SVG-RI, SVG-PDA  . ESOPHAGOGASTRODUODENOSCOPY (EGD) WITH PROPOFOL N/A 12/18/2015   Procedure: ESOPHAGOGASTRODUODENOSCOPY (EGD) WITH PROPOFOL;  Surgeon: Milus Banister, MD;  Location: [facility] ENDOS[facility];  Service: Endoscopy;  Laterality: N/A;  . FACELIFT, LOWER 2/3  1995   "mini" (01/21/2012)  . GIVENS CAPSULE STUDY N/A 12/21/2015   Procedure: GIVENS CAPSULE STUDY;  Surgeon: Milus Banister, MD;  Location: [facility];  Service: Endoscopy;  Laterality: N/A;  .  Lower Arterial Examination  10/28/2011   R. SFA stent mild-moderate mixed density plaque with elevated velocities consistent with 50% diameter reduction. L. SFA stent moderate mixed denisty plaque at mid to distal level consistent with 50-69% diameter reduction.  . LOWER EXTREMITY ANGIOGRAPHY N/A 02/15/2017   Procedure: LOWER EXTREMITY ANGIOGRAPHY;  Surgeon: Lorretta Harp, MD;  Location: [facility];  Service: Cardiovascular;  Laterality: N/A;  . OOPHORECTOMY  ~1979  . PARATHYROIDECTOMY N/A 05/13/2017   Procedure: PARATHYROIDECTOMY;  Surgeon: Armandina Gemma, MD;  Location: [facility];  Service: General;  Laterality: N/A;  .  PARTIAL COLECTOMY  2010  . PERIPHERAL ARTERIAL STENT GRAFT  2012; 2012   "LLE; RLE" (01/21/2012)  . PERIPHERAL VASCULAR BALLOON ANGIOPLASTY Right 02/15/2017   Procedure: PERIPHERAL VASCULAR BALLOON ANGIOPLASTY;  Surgeon: Lorretta Harp, MD;  Location: [facility];  Service: Cardiovascular;  Laterality: Right;  SFA    . PERIPHERAL VASCULAR INTERVENTION Right 10/06/2018   Procedure: PERIPHERAL VASCULAR INTERVENTION;  Surgeon: Lorretta Harp, MD;  Location: [facility];  Service: Cardiovascular;  Laterality: Right;  . PERMANENT PACEMAKER INSERTION N/A 01/21/2012   Medtronic Adapta L implanted by Dr Sallyanne Kuster for tachy/brady syndrome  . POSTERIOR CERVICAL LAMINECTOMY  1985  . TEE WITHOUT CARDIOVERSION N/A 01/04/2015   Procedure: TRANSESOPHAGEAL ECHOCARDIOGRAM (TEE);  Surgeon: Grace Isaac, MD;  Location: [facility];  Service: Open Heart Surgery;  Laterality: N/A;  . TEE WITHOUT CARDIOVERSION N/A 02/01/2015   Procedure: TRANSESOPHAGEAL ECHOCARDIOGRAM (TEE);  Surgeon: Lelon Perla, MD;  [facility];  Service: Cardiovascular;  Laterality: N/A;  . TEE WITHOUT CARDIOVERSION N/A 08/30/2015   Procedure: TRANSESOPHAGEAL ECHOCARDIOGRAM (TEE);  Surgeon: Sanda Klein, MD;  Location: [facility] ENDOSCOPY;  Service: Cardiovascular;  Laterality: N/A;  . [place] Do[place]     OB History   No obstetric history on file.     Family History  Problem Relation Age of Onset  . Heart disease Mother   . Hypertension Mother   . Stroke Mother   . Stroke Father   . Heart disease Father   . Heart disease Brother   . Hypertension Brother        2 brothers  . CVA Brother        2 brothers  . Heart attack Brother        2 brothers    Social History   Tobacco Use  . Smoking status: Former Smoker    Packs/day: 0.75    Years: 10.00    Pack years: 7.50    Types: Cigarettes    Quit date: 2008    Years since quitting: 13.6  . Smokeless tobacco: Never Used  . Tobacco  comment: 01/21/2012 "quit smoking ~ 2002"  Vaping Use  . Vaping Use: Never used  Substance Use Topics  . Alcohol use: Yes    Comment: daily coctail  . Drug use: No    Home Medications Prior to Admission medications   Medication Sig Start Date End Date Taking? Authorizing Provider  albuterol (VENTOLIN HFA) 108 (90 Base) MCG/ACT inhaler Inhale 1-2 puffs into the lungs every 6 (six) hours as needed for wheezing or shortness of breath. 09/05/19   Darr, Marguerita Beards, PA-C  amLODipine (NORVASC) 5 MG tablet TAKE 1.5 TABLETS (7.5 MG TOTAL) BY MOUTH DAILY. 08/30/19   Lorretta Harp, MD  aspirin EC 81 MG tablet Take 81 mg by mouth daily.     [provider]  benzonatate (TESSALON) 100 MG  capsule Take 1 capsule (100 mg total) by mouth every 8 (eight) hours. 10/05/19   Posey Boyer, MD  budesonide-formoterol (SYMBICORT) 80-4.5 MCG/ACT inhaler Inhale 2 puffs into the lungs 2 (two) times daily. Patient not taking: Reported on 10/05/2019 09/14/19   Horald Pollen, MD  Cholecalciferol (VITAMIN D3 PO) Take 1 tablet by mouth daily.    [provider]  citalopram (CELEXA) 40 MG tablet TAKE 1 TABLET BY MOUTH EVERY DAY 10/09/19   Horald Pollen, MD  clopidogrel (PLAVIX) 75 MG tablet TAKE 1 TABLET (75 MG TOTAL) BY MOUTH DAILY WITH BREAKFAST. 10/09/19   Lorretta Harp, MD  dofetilide (TIKOSYN) 250 MCG capsule Take 1 capsule (250 mcg total) by mouth 2 (two) times daily. 04/06/19   Croitoru, Mihai, MD  doxycycline (VIBRA-TABS) 100 MG tablet Take 1 tablet (100 mg total) by mouth 2 (two) times daily. 10/05/19   Posey Boyer, MD  furosemide (LASIX) 40 MG tablet Take 40 mg by mouth daily as needed for fluid or edema.     [provider]  levothyroxine (SYNTHROID, LEVOTHROID) 25 MCG tablet Take 25 mcg by mouth daily before breakfast.  04/03/16   [provider]  MAGNESIUM CITRATE PO Take 420 mg by mouth daily.     [provider]  Multiple Vitamins-Minerals  (MULTI-VITE) LIQD Take 1 Dose by mouth daily.    [provider]  NIACIN PO Take 1 tablet by mouth daily.    [provider]  nitroGLYCERIN (NITROSTAT) 0.4 MG SL tablet Place 1 tablet (0.4 mg total) under the tongue every 5 (five) minutes x 3 doses as needed for chest pain. 05/15/15   Lyda Jester M, PA-C  Omega-3 Fatty Acids (OMEGA 3 PO) Take 690 mg by mouth daily.    [provider]  TURMERIC PO Take 475 mg by mouth daily.    [provider]  vitamin C (ASCORBIC ACID) 500 MG tablet Take 500 mg by mouth daily.    [provider]    Allergies    Brilinta [ticagrelor], Clonidine derivatives, Atorvastatin, Crestor [rosuvastatin calcium], Exforge [amlodipine besylate-valsartan], and Spironolactone  Review of Systems   Review of Systems  Constitutional:       Per HPI, otherwise negative  HENT:       Per HPI, otherwise negative  Respiratory:       Per HPI, otherwise negative  Cardiovascular:       Per HPI, otherwise negative  Gastrointestinal: Positive for nausea. Negative for vomiting.  Endocrine:       Negative aside from HPI  Genitourinary:       Neg aside from HPI   Musculoskeletal:       Per HPI, otherwise negative  Skin: Negative.   Neurological: Positive for weakness. Negative for syncope.    Physical Exam Updated Vital Signs BP 111/74 (BP Location: Left Arm)   Pulse 81   Temp 99.5 F (37.5 C) (Oral)   Resp 19   Ht 5\' 4"  (1.626 m)   Wt 69.9 kg   SpO2 97%   BMI 26.43 kg/m   Physical Exam Vitals and nursing note reviewed.  Constitutional:      General: She is not in acute distress.    Appearance: She is well-developed. She is ill-appearing and diaphoretic.  HENT:     Head: Normocephalic and atraumatic.  Eyes:     Conjunctiva/sclera: Conjunctivae normal.  Cardiovascular:     Rate and Rhythm: Regular rhythm. Tachycardia present.  Pulses: Normal pulses.  Pulmonary:     Effort: Tachypnea present.  Abdominal:      General: There is no distension.  Skin:    General: Skin is warm.  Neurological:     Mental Status: She is alert and oriented to person, place, and time.     Cranial Nerves: No cranial nerve deficit.     Patient hypoxic 88% on room air.  With nasal cannula, 2 L she improved to 96%.  ED Results / Procedures / Treatments   Labs (all labs ordered are listed, but only abnormal results are displayed) Labs Reviewed  SARS CORONAVIRUS 2 BY RT PCR ([place], [place] [facility]) - Abnormal; Notable for the following components:      Result Value   SARS Coronavirus 2 POSITIVE (*)    All other components within normal limits  CBC WITH DIFFERENTIAL/PLATELET - Abnormal; Notable for the following components:   Platelets 140 (*)    All other components within normal limits  COMPREHENSIVE METABOLIC PANEL - Abnormal; Notable for the following components:   Sodium 127 (*)    Chloride 97 (*)    CO2 21 (*)    Glucose, Bld 137 (*)    BUN 24 (*)    Calcium 8.0 (*)    Total Protein 6.4 (*)    GFR calc non Af Amer 55 (*)    All other components within normal limits  D-DIMER, QUANTITATIVE (NOT AT [facility]) - Abnormal; Notable for the following components:   D-Dimer, Quant 1.09 (*)    All other components within normal limits  FERRITIN - Abnormal; Notable for the following components:   Ferritin 343 (*)    All other components within normal limits  FIBRINOGEN - Abnormal; Notable for the following components:   Fibrinogen 512 (*)    All other components within normal limits  C-REACTIVE PROTEIN - Abnormal; Notable for the following components:   CRP 6.1 (*)    All other components within normal limits  CULTURE, BLOOD (ROUTINE X 2)  CULTURE, BLOOD (ROUTINE X 2)  LACTIC ACID, PLASMA  PROCALCITONIN  LACTATE DEHYDROGENASE  TRIGLYCERIDES  LACTIC ACID, PLASMA    EKG EKG Interpretation  Date/Time:  Tuesday October 10 2019 19:21:41 EDT Ventricular Rate:  81 PR Interval:      QRS Duration: 91 QT Interval:  400 QTC Calculation: 465 R Axis:   64 Text Interpretation: AV dual-paced complexes Abnormal ECG Confirmed by Carmin Muskrat ([id number]) on 10/10/2019 8:53:07 PM   Radiology CT Angio Chest PE W/Cm &/Or Wo Cm  Result Date: 10/10/2019 CLINICAL DATA:  COVID-19 positive 6 days ago, cough, fatigue, decreased p.o. intake EXAM: CT ANGIOGRAPHY CHEST WITH CONTRAST TECHNIQUE: Multidetector CT imaging of the chest was performed using the standard protocol during bolus administration of intravenous contrast. Multiplanar CT image reconstructions and MIPs were obtained to evaluate the vascular anatomy. CONTRAST:  55mL OMNIPAQUE IOHEXOL 350 MG/ML SOLN COMPARISON:  08/26/2015, 10/10/2019 FINDINGS: Cardiovascular: This is a technically adequate evaluation of the pulmonary vasculature. There are no filling defects or pulmonary emboli. The heart is unremarkable without pericardial effusion. Postsurgical changes are seen from previous bypass surgery. Dual lead pacer again noted. Dilatation of the ascending thoracic aorta measuring up to 3.9 cm, stable since prior exam. Mild diffuse atherosclerosis. Mediastinum/Nodes: No enlarged mediastinal, hilar, or axillary lymph nodes. Thyroid gland, trachea, and esophagus demonstrate no significant findings. Lungs/Pleura: There is multifocal bilateral ground-glass airspace disease, most pronounced within the right upper lobe corresponding to  chest x-ray findings. Overall the appearance is consistent with COVID-19 pneumonia. No effusion or pneumothorax. Central airways are patent. Upper Abdomen: Stable right adrenal nodule likely an adenoma. No acute upper abdominal process. Musculoskeletal: No acute or destructive bony lesions. Reconstructed images demonstrate no additional findings. Review of the MIP images confirms the above findings. IMPRESSION: 1. No evidence of pulmonary embolus. 2. Multifocal bilateral airspace disease consistent with COVID 19 pneumonia.  3.  Aortic Atherosclerosis (ICD10-I70.0). Electronically Signed   By: Randa Ngo M.D.   On: 10/10/2019 21:31   DG Chest Port 1 View  Result Date: 10/10/2019 CLINICAL DATA:  COVID-19 positive, increasing shortness of breath EXAM: PORTABLE CHEST 1 VIEW COMPARISON:  09/05/2019 FINDINGS: Single frontal view of the chest demonstrates interval development of ground-glass airspace disease within the right midlung zone. Background interstitial prominence is unchanged. No effusion or pneumothorax. The cardiac silhouette is stable, with postsurgical changes from prior CABG. Dual lead pacer is unchanged. No acute bony abnormalities. IMPRESSION: 1. Interval development of ground-glass airspace disease within the right midlung zone, which could reflect early pneumonia. Electronically Signed   By: Randa Ngo M.D.   On: 10/10/2019 19:59    Procedures Procedures (including critical care time)  Medications Ordered in ED Medications - No data to display  ED Course  I have reviewed the triage vital signs and the nursing notes.  Pertinent labs & imaging results that were available during my care of the patient were reviewed by me and considered in my medical decision making (see chart for details).    10:02 PM CT PE study negative, other labs consistent with known coronavirus infection with mild hyponatremia, elevated fibrinogen, D-dimer.   Adult female, not vaccinated, presents with 1 week of URI illness, Covid positive result.  Here patient is awake alert, but found to have hypoxia, with new oxygen requirement of 2 L via nasal cannula.   [name] was evaluated in Emergency Department on 10/10/2019 for the symptoms described in the history of present illness. She was evaluated in the context of the global COVID-19 pandemic, which necessitated consideration that the patient might be at risk for infection with the SARS-CoV-2 virus that causes COVID-19. Institutional protocols and algorithms  that pertain to the evaluation of patients at risk for COVID-19 are in a state of rapid change based on information released by regulatory bodies including the CDC and federal and state organizations. These policies and algorithms were followed during the patient's care in the ED.   Given her new oxygen requirement, the context of ongoing Covid infection, she required admission for further monitoring, management.Final Clinical Impression(s) / ED Diagnoses Final diagnoses:  COVID-19     Carmin Muskrat, MD 10/10/19 [id number]

## 2019-10-10 NOTE — Telephone Encounter (Signed)
[facility] ED spoke to the Charge nurse to let her know patient will be coming there. The patient has Covid 19 and oxygen level is in the eighties (80's).The patient spoke to Dr Mitchel Honour and he advised her to go to ED.

## 2019-10-10 NOTE — Telephone Encounter (Signed)
Dr Mitchel Honour spoke to patient at her telemedicine appt at 5:00 pm. Patient was advised by the doctor to go to ED because of Sxs with Covid 19 and low oxygen level. I called [facility] ED and spoke to the Charge nurse to advise her patient was coming. ALSO, SEE PHONE MESSAGE.

## 2019-10-10 NOTE — ED Triage Notes (Signed)
Per EMS, states she tested positive for covid last Wednesday-test done at [facility]--94% on RA-complaining of cough, fatigue, poor PO intake-PCP informed patient to come to ED-

## 2019-10-10 NOTE — Progress Notes (Addendum)
Telemedicine Encounter- SOAP NOTE Established Patient Patient: Home  Provider: Office     This telephone encounter was conducted with the patient's (or proxy's) verbal consent via audio telecommunications: yes/no: Yes Patient was instructed to have this encounter in a suitably private space; and to only have persons present to whom they give permission to participate. In addition, patient identity was confirmed by use of name plus two identifiers (DOB and address).  I discussed the limitations, risks, security and privacy concerns of performing an evaluation and management service by telephone and the availability of in person appointments. I also discussed with the patient that there may be a patient responsible charge related to this service. The patient expressed understanding and agreed to proceed.  I spent a total of TIME; 0 MIN TO 60 MIN: 20 minutes talking with the patient or their proxy.  Chief Complaint  Patient presents with  . Covid Exposure    patient states she has had COVID-19 since 10/04/2019 and states she feels like shes going to die if she does not get the proper oxygen. She states her oxygen has been 82 at the most with servere coughing. States that her pulse might be incorrect and is it possible for her to get the infusion.    Subjective   [name] is a 79 y.o. female established patient. Telephone visit today complaining of flu like symptoms for the past 5 to 6 days progressively getting worse.  Tested positive for Covid.  O2 sats have been in the 80s at home.  Denies chest pain but states she gets short of breath if she talks too much.  No other significant symptoms.  Has multiple comorbidities including COPD and coronary artery disease.  HPI   Patient Active Problem List   Diagnosis Date Noted  . Hypothyroidism (acquired) 02/08/2018  . Coronary artery disease involving coronary bypass graft of native heart without angina pectoris 10/22/2017  .  Dyslipidemia 10/22/2017  . Cerebrovascular small vessel disease 04/27/2017  . Gait abnormality 03/25/2017  . Cerebrovascular disease 03/25/2017  . Claudication in peripheral vascular disease ([place]) 02/15/2017  . Hyperparathyroidism ([place]) 05/13/2016  . Pacemaker 01/10/2016  . Benign neoplasm of ascending colon   . Long term current use of anticoagulant   . CKD (chronic kidney disease), stage II   . H/O amiodarone therapy 12/04/2015  . Chronic diastolic heart failure ([place]) 10/19/2015  . Atypical atrial flutter ([place])   . Paroxysmal atrial flutter ([place]) 08/23/2015  . Chronic fatigue 06/27/2015  . Stented coronary artery   . S/P CABG x 3 01/04/15 01/10/2015  . CAD (coronary artery disease) 01/04/2015  . PAF (paroxysmal atrial fibrillation) ([place]) 01/22/2012  . Sinus node dysfunction ([place]) 01/22/2012  . S/P placement of cardiac pacemaker, medtronic adapta 01/21/12 01/22/2012  . PVD (peripheral vascular disease), hx stents to bil SFAs 02/2010 01/22/2012  . Hyperlipidemia 12/31/2009  . Anxiety 12/31/2009  . DEPRESSION 12/31/2009  . Essential hypertension 12/31/2009  . Coronary atherosclerosis 12/31/2009  . OSTEOARTHRITIS, HAND 12/31/2009  . [place] DISEASE, CERVICAL 12/31/2009  . [place] DISEASE, LUMBAR 12/31/2009  . UNSPECIFIED URINARY INCONTINENCE 12/31/2009    Past Medical History:  Diagnosis Date  . ANXIETY 12/31/2009  . Aortic insufficiency    a. mod by echo 2017.  Marland Kitchen CAD (coronary artery disease)    a. s/p CABGx3 and LAA clipping in 12/2014, NSTEMI 05/2015 s/p DES to native RCA and SVG-dRCA; occluded ramus-SVG was treated medically). c. neg nuc 10/2015 at [facility].  . Chronic  diastolic CHF (congestive heart failure) ([place])   . Chronic fatigue   . CKD (chronic kidney disease), stage II   . DEPRESSION 12/31/2009  . [place] DISEASE, CERVICAL 12/31/2009  . [place] DISEASE, LUMBAR 12/31/2009  . DIVERTICULITIS, HX OF 12/31/2009  . Essential hypertension   . Gait abnormality 03/25/2017  . GLUCOSE  INTOLERANCE 12/31/2009  . Habitual alcohol use   . History of stroke    self reports " they say i have had some pin strokes"   . Hypercalcemia   . Hyperkalemia   . Hyperlipidemia 12/31/2009  . Hypothyroidism   . MENOPAUSE, EARLY 12/31/2009  . NSTEMI (non-ST elevated myocardial infarction) ([place]) 05/11/2015  . Orthostatic hypotension   . OSTEOARTHRITIS, HAND   . Pacemaker 01/21/2012   MDT Adapta dual chamber  . Paroxysmal atrial flutter ([place])   . Persistent atrial fibrillation ([place])   . PVD (peripheral vascular disease), hx stents to bil SFAs 02/2010   . Rash, skin     superficial raised red pencil point sized rash bilateral forearms; states " it started when i started the Plavix "   . Ruptured left breast implant   . Symptomatic sinus bradycardia 01/22/2012  . Syncope    a. 11/2015 ? due to medications.  . Syncope    reports on 05-10-17 " i passed out 2 months ago in the bathroom and broke some ribs"    . Tachy-brady syndrome ([place])    a. s/p MDT PPM 2013.  . Tricuspid regurgitation     Current Outpatient Medications  Medication Sig Dispense Refill  . albuterol (VENTOLIN HFA) 108 (90 Base) MCG/ACT inhaler Inhale 1-2 puffs into the lungs every 6 (six) hours as needed for wheezing or shortness of breath. 8 g 0  . amLODipine (NORVASC) 5 MG tablet TAKE 1.5 TABLETS (7.5 MG TOTAL) BY MOUTH DAILY. 135 tablet 2  . aspirin EC 81 MG tablet Take 81 mg by mouth daily.     . benzonatate (TESSALON) 100 MG capsule Take 1 capsule (100 mg total) by mouth every 8 (eight) hours. 21 capsule 0  . Cholecalciferol (VITAMIN D3 PO) Take 1 tablet by mouth daily.    . citalopram (CELEXA) 40 MG tablet TAKE 1 TABLET BY MOUTH EVERY DAY 90 tablet 1  . clopidogrel (PLAVIX) 75 MG tablet TAKE 1 TABLET (75 MG TOTAL) BY MOUTH DAILY WITH BREAKFAST. 90 tablet 3  . dofetilide (TIKOSYN) 250 MCG capsule Take 1 capsule (250 mcg total) by mouth 2 (two) times daily. 180 capsule 3  . doxycycline (VIBRA-TABS) 100 MG tablet Take  1 tablet (100 mg total) by mouth 2 (two) times daily. 20 tablet 0  . furosemide (LASIX) 40 MG tablet Take 40 mg by mouth daily as needed for fluid or edema.     Marland Kitchen levothyroxine (SYNTHROID, LEVOTHROID) 25 MCG tablet Take 25 mcg by mouth daily before breakfast.     . MAGNESIUM CITRATE PO Take 420 mg by mouth daily.     . Multiple Vitamins-Minerals (MULTI-VITE) LIQD Take 1 Dose by mouth daily.    Marland Kitchen NIACIN PO Take 1 tablet by mouth daily.    . nitroGLYCERIN (NITROSTAT) 0.4 MG SL tablet Place 1 tablet (0.4 mg total) under the tongue every 5 (five) minutes x 3 doses as needed for chest pain. 25 tablet 2  . Omega-3 Fatty Acids (OMEGA 3 PO) Take 690 mg by mouth daily.    . TURMERIC PO Take 475 mg by mouth daily.    . vitamin C (ASCORBIC  ACID) 500 MG tablet Take 500 mg by mouth daily.    . budesonide-formoterol (SYMBICORT) 80-4.5 MCG/ACT inhaler Inhale 2 puffs into the lungs 2 (two) times daily. (Patient not taking: Reported on 10/05/2019) 10.2 g 3   No current facility-administered medications for this visit.    Allergies  Allergen Reactions  . Brilinta [Ticagrelor] Shortness Of Breath  . Clonidine Derivatives Other (See Comments)    Lowers heart rate  . Atorvastatin Other (See Comments)    Possible cause of fatigue/malaise  . Crestor [Rosuvastatin Calcium] Other (See Comments)    Joint/muscle aches  . Exforge [Amlodipine Besylate-Valsartan] Itching and Rash  . Spironolactone Other (See Comments)    Contraindicated with history of hyperkalemia    Social History   Socioeconomic History  . Marital status: Widowed    Spouse name: Not on file  . Number of children: 3  . Years of education: Not on file  . Highest education level: Not on file  Occupational History  . 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 Smoking status: Former Smoker    Packs/day: 0.75    Years: 10.00    Pack years: 7.50    Types: Cigarettes    Quit date: 2008    Years since  quitting: 13.6  . Smokeless tobacco: Never Used  . Tobacco comment: 01/21/2012 "quit smoking ~ 2002"  Vaping Use  . Vaping Use: Never used  Substance and Sexual Activity  . Alcohol use: Yes    Comment: daily coctail  . Drug use: No  . Sexual activity: Not Currently  Other Topics Concern  . Not on file  Social History Narrative   Lives in [place].  Retired.   Social Determinants of Health   Financial Resource Strain:   . Difficulty of Paying Living Expenses: Not on file  Food Insecurity:   . Worried About Charity fundraiser in the Last Year: Not on file  . Ran Out of Food in the Last Year: Not on file  Transportation Needs:   . Lack of Transportation (Medical): Not on file  . Lack of Transportation (Non-Medical): Not on file  Physical Activity:   . Days of Exercise per Week: Not on file  . Minutes of Exercise per Session: Not on file  Stress:   . Feeling of Stress : Not on file  Social Connections:   . Frequency of Communication with Friends and Family: Not on file  . Frequency of Social Gatherings with Friends and Family: Not on file  . Attends Religious Services: Not on file  . Active Member of Clubs or Organizations: Not on file  . Attends Archivist Meetings: Not on file  . Marital Status: Not on file  Intimate Partner Violence:   . Fear of Current or Ex-Partner: Not on file  . Emotionally Abused: Not on file  . Physically Abused: Not on file  . Sexually Abused: Not on file    ROS  Objective  Alert and oriented x3 in no apparent respiratory distress. Vitals as reported by the patient: There were no vitals filed for this visit.  There are no diagnoses linked to this encounter. Adaiah was seen today for covid exposure.  Diagnoses and all orders for this visit:  Flu-like symptoms  COVID-19 virus infection  Chronic obstructive pulmonary disease, unspecified COPD type ([place])    Advised to go to the emergency room now. I discussed the  assessment and treatment plan with the patient.  The patient was provided an opportunity to ask questions and all were answered. The patient agreed with the plan and demonstrated an understanding of the instructions.   The patient was advised to call back or seek an in-person evaluation if the symptoms worsen or if the condition fails to improve as anticipated.  I provided 20 minutes of non-face-to-face time during this encounter.  Horald Pollen, MD  Primary Care at [facility]

## 2019-10-10 NOTE — H&P (Signed)
History and Physical   [name] BPZ:[id number] DOB: Dec 15, 1940 DOA: 10/10/2019  Referring MD/NP/PA: Dr. Vanita Panda  PCP: Horald Pollen, MD   Outpatient Specialists: None  Patient coming from: Home  Chief Complaint: Shortness of breath and cough  HPI: [name] is a 79 y.o. female with medical history significant of coronary artery disease, chronic kidney disease stage III, status post CABG in 2017, depression, degenerative disc disease, hypertension and hyperlipidemia who was diagnosed with COVID-19 infection about a week ago.  Patient has been managing her symptoms at home and has been stable.  In the last 2 days however it has gotten worse with more shortness of breath cough as well as sputum.  Patient got tired with fever and came to the ER today.  She was seen and evaluated.  Patient is now having new oxygen requirement so she is being admitted with COVID-19 infection with oxygen requirement..  ED Course: Temperature 102 blood pressure 104/60 pulse 81 respirate of 23 oxygen sat 88% on room air.  White count is 4.1 hemoglobin 13.4 platelets 140.  Sodium 127 potassium 3.8 chloride 97 CO2 21 BUN 24 creatinine 0.9 and calcium 8.0.  Chest x-ray showed groundglass airspace disease within right midlung zone.  CT angiogram of the chest showed multifocal bilateral airspace disease consistent with COVID-19 pneumonia.  Patient being admitted for further evaluation and treatment.  Review of Systems: As per HPI otherwise 10 point review of systems negative.    Past Medical History:  Diagnosis Date  . ANXIETY 12/31/2009  . Aortic insufficiency    a. mod by echo 2017.  [place] Kitchen CAD (coronary artery disease)    a. s/p CABGx3 and LAA clipping in 12/2014, NSTEMI 05/2015 s/p DES to native RCA and SVG-dRCA; occluded ramus-SVG was treated medically). c. neg nuc 10/2015 at [facility].  . Chronic diastolic CHF (congestive heart failure) ([place])   . Chronic fatigue   . CKD (chronic kidney disease),  stage II   . DEPRESSION 12/31/2009  . [place] DISEASE, CERVICAL 12/31/2009  . [place] DISEASE, LUMBAR 12/31/2009  . DIVERTICULITIS, HX OF 12/31/2009  . Essential hypertension   . Gait abnormality 03/25/2017  . GLUCOSE INTOLERANCE 12/31/2009  . Habitual alcohol use   . History of stroke    self reports " they say i have had some pin strokes"   . Hypercalcemia   . Hyperkalemia   . Hyperlipidemia 12/31/2009  . Hypothyroidism   . MENOPAUSE, EARLY 12/31/2009  . NSTEMI (non-ST elevated myocardial infarction) ([place]) 05/11/2015  . Orthostatic hypotension   . OSTEOARTHRITIS, HAND   . Pacemaker 01/21/2012   MDT Adapta dual chamber  . Paroxysmal atrial flutter ([place])   . Persistent atrial fibrillation ([place])   . PVD (peripheral vascular disease), hx stents to bil SFAs 02/2010   . Rash, skin     superficial raised red pencil point sized rash bilateral forearms; states " it started when i started the Plavix "   . Ruptured left breast implant   . Symptomatic sinus bradycardia 01/22/2012  . Syncope    a. 11/2015 ? due to medications.  . Syncope    reports on 05-10-17 " i passed out 2 months ago in the bathroom and broke some ribs"    . Tachy-brady syndrome ([place])    a. s/p MDT PPM 2013.  . Tricuspid regurgitation     Past Surgical History:  Procedure Laterality Date  . ABDOMINAL AORTOGRAM W/LOWER EXTREMITY Bilateral 02/15/2017   Procedure: ABDOMINAL AORTOGRAM W/LOWER EXTREMITY;  Surgeon: Lorretta Harp, MD;  Location: [place] [facility];  Service: Cardiovascular;  Laterality: Bilateral;  bilat  . ABDOMINAL AORTOGRAM W/LOWER EXTREMITY N/A 10/06/2018   Procedure: ABDOMINAL AORTOGRAM W/LOWER EXTREMITY;  Surgeon: Lorretta Harp, MD;  Location: [place] [facility];  Service: Cardiovascular;  Laterality: N/A;  . Ablation of typical atrial flutter (CTI line)  09/05/2016   [facility] Antonieta [facility]ert at [facility]   . [place]   Removed 01/2018  . BREAST IMPLANT REMOVAL Bilateral 01/14/2018    Procedure: REMOVAL BILATERAL BREAST IMPLANTS;  Surgeon: Irene Limbo, MD;  Location: [place];  Service: Plastics;  Laterality: Bilateral;  . CAPSULECTOMY Bilateral 01/14/2018   Procedure: CAPSULECTOMY;  Surgeon: Irene Limbo, MD;  Location: [place];  Service: Plastics;  Laterality: Bilateral;  . CARDIAC CATHETERIZATION N/A 01/01/2015   Procedure: Left Heart Cath and Coronary Angiography;  Surgeon: Jettie Booze, MD;  Location: [place] [facility];  Service: Cardiovascular;  Laterality: N/A;  . CARDIAC CATHETERIZATION N/A 05/12/2015   Procedure: Left Heart Cath and Coronary Angiography;  Surgeon: Troy Sine, MD;  Location: [place] [facility];  Service: Cardiovascular;  Laterality: N/A;  . CARDIAC CATHETERIZATION N/A 05/12/2015   Procedure: Coronary Stent Intervention;  Surgeon: Troy Sine, MD;  Location: [place] [facility];  Service: Cardiovascular;  Laterality: N/A;  . CARDIOVERSION N/A 02/01/2015   Procedure: CARDIOVERSION;  Surgeon: Lelon Perla, MD;  Location: [facility];  Service: Cardiovascular;  Laterality: N/A;  . CARDIOVERSION N/A 08/30/2015   Procedure: CARDIOVERSION;  Surgeon: Sanda Klein, MD;  Location: [place] [facility]OSCOPY;  Service: Cardiovascular;  Laterality: N/A;  . CARPAL TUNNEL RELEASE  2008   "right hand/thumb; carpal tunnel repair; got rid of arthritis" (01/21/2012)  . CLIPPING OF ATRIAL APPENDAGE N/A 01/04/2015   Procedure: CLIPPING OF ATRIAL APPENDAGE;  Surgeon: Grace Isaac, MD;  Location: [place];  Service: Open Heart Surgery;  Laterality: N/A;  . COLONOSCOPY N/A 12/21/2015   Procedure: COLONOSCOPY;  Surgeon: Milus Banister, MD;  Location: [facility];  Service: Endoscopy;  Laterality: N/A;  . CORONARY ARTERY BYPASS GRAFT N/A 01/04/2015   Procedure: CORONARY ARTERY BYPASS GRAFTING (CABG) x 3 using left internal mammory artery and greater saphenous vein right leg harvested endoscopically.;  Surgeon: Grace Isaac, MD;  LIMA-LAD, SVG-RI, SVG-PDA  . ESOPHAGOGASTRODUODENOSCOPY (EGD) WITH PROPOFOL N/A 12/18/2015   Procedure: ESOPHAGOGASTRODUODENOSCOPY (EGD) WITH PROPOFOL;  Surgeon: Milus Banister, MD;  Location: [facility];  Service: Endoscopy;  Laterality: N/A;  . FACELIFT, LOWER 2/3  1995   "mini" (01/21/2012)  . GIVENS CAPSULE STUDY N/A 12/21/2015   Procedure: GIVENS CAPSULE STUDY;  Surgeon: Milus Banister, MD;  Location: [facility];  Service: Endoscopy;  Laterality: N/A;  . Lower Arterial Examination  10/28/2011   R. SFA stent mild-moderate mixed density plaque with elevated velocities consistent with 50% diameter reduction. L. SFA stent moderate mixed denisty plaque at mid to distal level consistent with 50-69% diameter reduction.  . LOWER EXTREMITY ANGIOGRAPHY N/A 02/15/2017   Procedure: LOWER EXTREMITY ANGIOGRAPHY;  Surgeon: Lorretta Harp, MD;  Location: [place] [facility];  Service: Cardiovascular;  Laterality: N/A;  . OOPHORECTOMY  ~1979  . PARATHYROIDECTOMY N/A 05/13/2017   Procedure: PARATHYROIDECTOMY;  Surgeon: Armandina Gemma, MD;  Location: [facility];  Service: General;  Laterality: N/A;  . PARTIAL COLECTOMY  2010  . PERIPHERAL ARTERIAL STENT GRAFT  2012; 2012   "LLE; RLE" (01/21/2012)  . PERIPHERAL VASCULAR BALLOON  ANGIOPLASTY Right 02/15/2017   Procedure: PERIPHERAL VASCULAR BALLOON ANGIOPLASTY;  Surgeon: Lorretta Harp, MD;  Location: [place] [facility];  Service: Cardiovascular;  Laterality: Right;  SFA    . PERIPHERAL VASCULAR INTERVENTION Right 10/06/2018   Procedure: PERIPHERAL VASCULAR INTERVENTION;  Surgeon: Lorretta Harp, MD;  Location: [place] [facility];  Service: Cardiovascular;  Laterality: Right;  . PERMANENT PACEMAKER INSERTION N/A 01/21/2012   Medtronic Adapta L implanted by Dr Sallyanne Kuster for tachy/brady syndrome  . POSTERIOR CERVICAL LAMINECTOMY  1985  . TEE WITHOUT CARDIOVERSION N/A 01/04/2015   Procedure: TRANSESOPHAGEAL ECHOCARDIOGRAM (TEE);  Surgeon: Grace Isaac, MD;  Location: [place];  Service: Open Heart Surgery;  Laterality: N/A;  . TEE WITHOUT CARDIOVERSION N/A 02/01/2015   Procedure: TRANSESOPHAGEAL ECHOCARDIOGRAM (TEE);  Surgeon: Lelon Perla, MD;  [facility] ENDOSCOPY;  Service: Cardiovascular;  Laterality: N/A;  . TEE WITHOUT CARDIOVERSION N/A 08/30/2015   Procedure: TRANSESOPHAGEAL ECHOCARDIOGRAM (TEE);  Surgeon: Sanda Klein, MD;  Location: [facility] ENDOSCOPY;  Service: Cardiovascular;  Laterality: N/A;  . [place]     reports that she quit smoking about 13 years ago. Her smoking use included cigarettes. She has a 7.50 pack-year smoking history. She has never used smokeless tobacco. She reports current alcohol use. She reports that she does not use drugs.  Allergies  Allergen Reactions  . Brilinta [Ticagrelor] Shortness Of Breath  . Clonidine Derivatives Other (See Comments)    Lowers heart rate  . Atorvastatin Other (See Comments)    Possible cause of fatigue/malaise  . Crestor [Rosuvastatin Calcium] Other (See Comments)    Joint/muscle aches  . Exforge [Amlodipine Besylate-Valsartan] Itching and Rash  . Spironolactone Other (See Comments)    Contraindicated with history of hyperkalemia    Family History  Problem Relation Age of Onset  . Heart disease Mother   . Hypertension Mother   . Stroke Mother   . Stroke Father   . Heart disease Father   . Heart disease Brother   . Hypertension Brother        2 brothers  . CVA Brother        2 brothers  . Heart attack Brother        2 brothers     Prior to Admission medications   Medication Sig Start Date End Date Taking? Authorizing Provider  albuterol (VENTOLIN HFA) 108 (90 Base) MCG/ACT inhaler Inhale 1-2 puffs into the lungs every 6 (six) hours as needed for wheezing or shortness of breath. 09/05/19   Darr, Marguerita Beards, PA-C  amLODipine (NORVASC) 5 MG tablet TAKE 1.5 TABLETS (7.5 MG TOTAL) BY MOUTH DAILY. 08/30/19   Lorretta Harp, MD  aspirin EC 81  MG tablet Take 81 mg by mouth daily.     [provider]  benzonatate (TESSALON) 100 MG capsule Take 1 capsule (100 mg total) by mouth every 8 (eight) hours. 10/05/19   Posey Boyer, MD  budesonide-formoterol (SYMBICORT) 80-4.5 MCG/ACT inhaler Inhale 2 puffs into the lungs 2 (two) times daily. Patient not taking: Reported on 10/05/2019 09/14/19   Horald Pollen, MD  Cholecalciferol (VITAMIN D3 PO) Take 1 tablet by mouth daily.    [provider]  citalopram (CELEXA) 40 MG tablet TAKE 1 TABLET BY MOUTH EVERY DAY 10/09/19   Horald Pollen, MD  clopidogrel (PLAVIX) 75 MG tablet TAKE 1 TABLET (75 MG TOTAL) BY MOUTH DAILY WITH BREAKFAST. 10/09/19   Lorretta Harp, MD  dofetilide (TIKOSYN) 250 MCG  capsule Take 1 capsule (250 mcg total) by mouth 2 (two) times daily. 04/06/19   Croitoru, Mihai, MD  doxycycline (VIBRA-TABS) 100 MG tablet Take 1 tablet (100 mg total) by mouth 2 (two) times daily. 10/05/19   Posey Boyer, MD  furosemide (LASIX) 40 MG tablet Take 40 mg by mouth daily as needed for fluid or edema.     [provider]  levothyroxine (SYNTHROID, LEVOTHROID) 25 MCG tablet Take 25 mcg by mouth daily before breakfast.  04/03/16   [provider]  MAGNESIUM CITRATE PO Take 420 mg by mouth daily.     [provider]  Multiple Vitamins-Minerals (MULTI-VITE) LIQD Take 1 Dose by mouth daily.    [provider]  NIACIN PO Take 1 tablet by mouth daily.    [provider]  nitroGLYCERIN (NITROSTAT) 0.4 MG SL tablet Place 1 tablet (0.4 mg total) under the tongue every 5 (five) minutes x 3 doses as needed for chest pain. 05/15/15   Lyda Jester M, PA-C  Omega-3 Fatty Acids (OMEGA 3 PO) Take 690 mg by mouth daily.    [provider]  TURMERIC PO Take 475 mg by mouth daily.    [provider]  vitamin C (ASCORBIC ACID) 500 MG tablet Take 500 mg by mouth daily.    [provider]    Physical  Exam: Vitals:   10/10/19 2000 10/10/19 2130 10/10/19 2201 10/10/19 2230  BP: 104/60 (!) 120/58 122/61 115/66  Pulse: 77 76 75 76  Resp: (!) 23 (!) 21 19 17   Temp:   99.3 F (37.4 C)   TempSrc:      SpO2: 96% 91% 95% 96%  Weight:      Height:          Constitutional: Acutely ill looking with no distress Vitals:   10/10/19 2000 10/10/19 2130 10/10/19 2201 10/10/19 2230  BP: 104/60 (!) 120/58 122/61 115/66  Pulse: 77 76 75 76  Resp: (!) 23 (!) 21 19 17   Temp:   99.3 F (37.4 C)   TempSrc:      SpO2: 96% 91% 95% 96%  Weight:      Height:       Eyes: PERRL, lids and conjunctivae normal ENMT: Mucous membranes are dry. Posterior pharynx clear of any exudate or lesions.Normal dentition.  Neck: normal, supple, no masses, no thyromegaly Respiratory: Coarse breath sound bilaterally with some rhonchi and mild Rales, normal respiratory effort. No accessory muscle use.  Cardiovascular: Regular rate and rhythm, no murmurs / rubs / gallops. No extremity edema. 2+ pedal pulses. No carotid bruits.  Abdomen: no tenderness, no masses palpated. No hepatosplenomegaly. Bowel sounds positive.  Musculoskeletal: no clubbing / cyanosis. No joint deformity upper and lower extremities. Good ROM, no contractures. Normal muscle tone.  Skin: no rashes, lesions, ulcers. No induration Neurologic: CN 2-12 grossly intact. Sensation intact, DTR normal. Strength 5/5 in all 4.  Psychiatric: Normal judgment and insight. Alert and oriented x 3. Normal mood.     Labs on Admission: I have personally reviewed following labs and imaging studies  CBC: Recent Labs  Lab 10/10/19 1921  WBC 4.1  NEUTROABS 2.9  HGB 13.4  HCT 39.0  MCV 89.4  PLT 629*   Basic Metabolic Panel: Recent Labs  Lab 10/10/19 1921  NA 127*  K 3.8  CL 97*  CO2 21*  GLUCOSE 137*  BUN 24*  CREATININE 0.98  CALCIUM 8.0*   GFR: Estimated Creatinine Clearance: 44.7 mL/min (by C-G  formula based on SCr of 0.98 mg/dL). Liver  Function Tests: Recent Labs  Lab 10/10/19 1921  AST 26  ALT 15  ALKPHOS 78  BILITOT 0.4  PROT 6.4*  ALBUMIN 3.5   No results for input(s): LIPASE, AMYLASE in the last 168 hours. No results for input(s): AMMONIA in the last 168 hours. Coagulation Profile: No results for input(s): INR, PROTIME in the last 168 hours. Cardiac Enzymes: No results for input(s): CKTOTAL, CKMB, CKMBINDEX, TROPONINI in the last 168 hours. BNP (last 3 results) No results for input(s): PROBNP in the last 8760 hours. HbA1C: No results for input(s): HGBA1C in the last 72 hours. CBG: No results for input(s): GLUCAP in the last 168 hours. Lipid Profile: Recent Labs    10/10/19 1921  TRIG 83   Thyroid Function Tests: No results for input(s): TSH, T4TOTAL, FREET4, T3FREE, THYROIDAB in the last 72 hours. Anemia Panel: Recent Labs    10/10/19 1921  FERRITIN 343*   Urine analysis:    Component Value Date/Time   COLORURINE AMBER (A) 02/07/2018 0946   APPEARANCEUR CLEAR 02/07/2018 0946   LABSPEC 1.040 (H) 02/07/2018 0946   PHURINE 5.0 02/07/2018 0946   GLUCOSEU NEGATIVE 02/07/2018 0946   GLUCOSEU NEGATIVE 12/31/2009 1612   HGBUR NEGATIVE 02/07/2018 0946   BILIRUBINUR NEGATIVE 02/07/2018 0946   BILIRUBINUR small (A) 02/04/2017 1454   KETONESUR 20 (A) 02/07/2018 0946   PROTEINUR 30 (A) 02/07/2018 0946   UROBILINOGEN 0.2 02/04/2017 1454   UROBILINOGEN 1.0 06/03/2012 1610   NITRITE NEGATIVE 02/07/2018 0946   LEUKOCYTESUR NEGATIVE 02/07/2018 0946   Sepsis Labs: @LABRCNTIP (procalcitonin:4,lacticidven:4) ) Recent Results (from the past 240 hour(s))  SARS Coronavirus 2 by RT PCR (hospital order, performed in [place] hospital lab) Nasopharyngeal Nasopharyngeal Swab     Status: Abnormal   Collection Time: 10/10/19  7:21 PM   Specimen: Nasopharyngeal Swab  Result Value Ref Range Status   SARS Coronavirus 2 POSITIVE (A) NEGATIVE Final    Comment: RESULT CALLED TO, READ BACK BY AND VERIFIED  WITH: HODGES,I. RN @2104  ON 08.31.2021 BY COHEN,K (NOTE) SARS-CoV-2 target nucleic acids are DETECTED  SARS-CoV-2 RNA is generally detectable in upper respiratory specimens  during the acute phase of infection.  Positive results are indicative  of the presence of the identified virus, but do not rule out bacterial infection or co-infection with other pathogens not detected by the test.  Clinical correlation with patient history and  other diagnostic information is necessary to determine patient infection status.  The expected result is negative.  Fact Sheet for Patients:   StrictlyIdeas.no   Fact Sheet for Healthcare Providers:   BankingDealers.co.za    This test is not yet approved or cleared by the [place] FDA and  has been authorized for detection and/or diagnosis of SARS-CoV-2 by FDA under an Emergency Use Authorization (EUA).  This EUA will remain in effect (meanin g this test can be used) for the duration of  the COVID-19 declaration under Section 564(b)(1) of the Act, 21 U.S.C. section 360-bbb-3(b)(1), unless the authorization is terminated or revoked sooner.  Performed at [facility], [place] [address], [place], [place] [address]      Radiological Exams on Admission: CT Angio Chest PE W/Cm &/Or Wo Cm  Result Date: 10/10/2019 CLINICAL DATA:  COVID-19 positive 6 days ago, cough, fatigue, decreased p.o. intake EXAM: CT ANGIOGRAPHY CHEST WITH CONTRAST TECHNIQUE: Multidetector CT imaging of the chest was performed using the standard protocol during bolus administration of intravenous contrast.  Multiplanar CT image reconstructions and MIPs were obtained to evaluate the vascular anatomy. CONTRAST:  36mL OMNIPAQUE IOHEXOL 350 MG/ML SOLN COMPARISON:  08/26/2015, 10/10/2019 FINDINGS: Cardiovascular: This is a technically adequate evaluation of the pulmonary vasculature. There are no filling defects or pulmonary  emboli. The heart is unremarkable without pericardial effusion. Postsurgical changes are seen from previous bypass surgery. Dual lead pacer again noted. Dilatation of the ascending thoracic aorta measuring up to 3.9 cm, stable since prior exam. Mild diffuse atherosclerosis. Mediastinum/Nodes: No enlarged mediastinal, hilar, or axillary lymph nodes. Thyroid gland, trachea, and esophagus demonstrate no significant findings. Lungs/Pleura: There is multifocal bilateral ground-glass airspace disease, most pronounced within the right upper lobe corresponding to chest x-ray findings. Overall the appearance is consistent with COVID-19 pneumonia. No effusion or pneumothorax. Central airways are patent. Upper Abdomen: Stable right adrenal nodule likely an adenoma. No acute upper abdominal process. Musculoskeletal: No acute or destructive bony lesions. Reconstructed images demonstrate no additional findings. Review of the MIP images confirms the above findings. IMPRESSION: 1. No evidence of pulmonary embolus. 2. Multifocal bilateral airspace disease consistent with COVID 19 pneumonia. 3.  Aortic Atherosclerosis (ICD10-I70.0). Electronically Signed   By: Randa Ngo M.D.   On: 10/10/2019 21:31   DG Chest Port 1 View  Result Date: 10/10/2019 CLINICAL DATA:  COVID-19 positive, increasing shortness of breath EXAM: PORTABLE CHEST 1 VIEW COMPARISON:  09/05/2019 FINDINGS: Single frontal view of the chest demonstrates interval development of ground-glass airspace disease within the right midlung zone. Background interstitial prominence is unchanged. No effusion or pneumothorax. The cardiac silhouette is stable, with postsurgical changes from prior CABG. Dual lead pacer is unchanged. No acute bony abnormalities. IMPRESSION: 1. Interval development of ground-glass airspace disease within the right midlung zone, which could reflect early pneumonia. Electronically Signed   By: Randa Ngo M.D.   On: 10/10/2019 19:59    EKG:  Independently reviewed.  Sinus rhythm no significant findings  Assessment/Plan Principal Problem:   Acute respiratory failure due to COVID-19 [facility]) Active Problems:   Anxiety   Essential hypertension   CAD (coronary artery disease)   CKD (chronic kidney disease), stage II   Dyslipidemia   Hypothyroidism (acquired)   Hyponatremia     #1 acute respiratory failure due to COVID-19 pneumonia: Patient will be admitted.  Initiate remdesivir, dexamethasone, breathing treatment and oxygen.  Titrate oxygen off.  Check daily markers for COVID-19 per protocol.  #2 coronary artery disease: Stable at her baseline.  #3 essential hypertension: Monitor blood pressure continue treatment.  #4 chronic kidney disease stage II: Renal function close to baseline.  Monitor closely.  #5 hyponatremia: Sodium 127.  Probably dehydration.  Gentle hydration and monitor sodium level.  #5 hypothyroidism: Continue with levothyroxine.  #6 hyperlipidemia: Continue with statin.   DVT prophylaxis: Lovenox Code Status: Full code Family Communication: No family at bedside Disposition Plan: To be determined Consults called: None Admission status: Inpatient  Severity of Illness: The appropriate patient status for this patient is INPATIENT. Inpatient status is judged to be reasonable and necessary in order to provide the required intensity of service to ensure the patient's safety. The patient's presenting symptoms, physical exam findings, and initial radiographic and laboratory data in the context of their chronic comorbidities is felt to place them at high risk for further clinical deterioration. Furthermore, it is not anticipated that the patient will be medically stable for discharge from the hospital within 2 midnights of admission. The following factors support the patient status of inpatient.   "  The patient's presenting symptoms include shortness of breath and cough. " The worrisome physical exam findings  include coarse breath sounds with some rails. " The initial radiographic and laboratory data are worrisome because of CT chest showing bilateral pneumonia. " The chronic co-morbidities include coronary artery disease.   * I certify that at the point of admission it is my clinical judgment that the patient will require inpatient hospital care spanning beyond 2 midnights from the point of admission due to high intensity of service, high risk for further deterioration and high frequency of surveillance required.Barbette Merino MD [facility] Pager [phone]  If 7PM-7AM, please contact night-coverage [email or web address] Password [email or web address]  10/10/2019, 11:01 PM

## 2019-10-10 NOTE — Patient Instructions (Signed)
° ° ° °  If you have lab work done today you will be contacted with your lab results within the next 2 weeks.  If you have not heard from us then please contact us. The fastest way to get your results is to register for My Chart. ° ° °IF you received an x-ray today, you will receive an invoice from [facility]. Please contact [facility] at [phone] with questions or concerns regarding your invoice.  ° °IF you received labwork today, you will receive an invoice from [facility]. Please contact [facility] at [phone] with questions or concerns regarding your invoice.  ° °Our billing staff will not be able to assist you with questions regarding bills from these companies. ° °You will be contacted with the lab results as soon as they are available. The fastest way to get your results is to activate your My Chart account. Instructions are located on the last page of this paperwork. If you have not heard from us regarding the results in 2 weeks, please contact this office. °  ° ° ° °

## 2019-10-11 LAB — COMPREHENSIVE METABOLIC PANEL
ALT: 14 U/L (ref 0–44)
AST: 31 U/L (ref 15–41)
Albumin: 3.5 g/dL (ref 3.5–5.0)
Alkaline Phosphatase: 84 U/L (ref 38–126)
Anion gap: 12 (ref 5–15)
BUN: 21 mg/dL (ref 8–23)
CO2: 20 mmol/L — ABNORMAL LOW (ref 22–32)
Calcium: 8.2 mg/dL — ABNORMAL LOW (ref 8.9–10.3)
Chloride: 99 mmol/L (ref 98–111)
Creatinine, Ser: 0.97 mg/dL (ref 0.44–1.00)
GFR calc Af Amer: >60 mL/min (ref >60)
GFR calc non Af Amer: 56 mL/min — ABNORMAL LOW (ref >60)
Glucose, Bld: 139 mg/dL — ABNORMAL HIGH (ref 70–99)
Potassium: 4.6 mmol/L (ref 3.5–5.1)
Sodium: 131 mmol/L — ABNORMAL LOW (ref 135–145)
Total Bilirubin: 0.6 mg/dL (ref 0.3–1.2)
Total Protein: 6.8 g/dL (ref 6.5–8.1)

## 2019-10-11 LAB — CBC WITH DIFFERENTIAL/PLATELET
Abs Immature Granulocytes: 0.02 10*3/uL (ref 0.00–0.07)
Basophils Absolute: 0 10*3/uL (ref 0.0–0.1)
Basophils Relative: 0 %
Eosinophils Absolute: 0 10*3/uL (ref 0.0–0.5)
Eosinophils Relative: 0 %
HCT: 40 % (ref 36.0–46.0)
Hemoglobin: 13.6 g/dL (ref 12.0–15.0)
Immature Granulocytes: 0 %
Lymphocytes Relative: 20 %
Lymphs Abs: 1 10*3/uL (ref 0.7–4.0)
MCH: 30.4 pg (ref 26.0–34.0)
MCHC: 34 g/dL (ref 30.0–36.0)
MCV: 89.5 fL (ref 80.0–100.0)
Monocytes Absolute: 0.3 10*3/uL (ref 0.1–1.0)
Monocytes Relative: 6 %
Neutro Abs: 3.7 10*3/uL (ref 1.7–7.7)
Neutrophils Relative %: 74 %
Platelets: 145 10*3/uL — ABNORMAL LOW (ref 150–400)
RBC: 4.47 MIL/uL (ref 3.87–5.11)
RDW: 12.5 % (ref 11.5–15.5)
WBC: 5 10*3/uL (ref 4.0–10.5)
nRBC: 0 % (ref 0.0–0.2)

## 2019-10-11 LAB — PHOSPHORUS: Phosphorus: 3.2 mg/dL (ref 2.5–4.6)

## 2019-10-11 LAB — MAGNESIUM: Magnesium: 2 mg/dL (ref 1.7–2.4)

## 2019-10-11 LAB — ABO/RH: ABO/RH(D): A POS

## 2019-10-11 LAB — C-REACTIVE PROTEIN: CRP: 6.5 mg/dL — ABNORMAL HIGH (ref <1.0)

## 2019-10-11 LAB — D-DIMER, QUANTITATIVE: D-Dimer, Quant: 1.05 ug/mL-FEU — ABNORMAL HIGH (ref 0.00–0.50)

## 2019-10-11 LAB — FERRITIN: Ferritin: 331 ng/mL — ABNORMAL HIGH (ref 11–307)

## 2019-10-11 MED ORDER — LEVOTHYROXINE SODIUM 25 MCG PO TABS
25.0000 ug | ORAL_TABLET | Freq: Every day | ORAL | Status: DC
  Administered 2019-10-12 – 2019-10-16 (×5): 25 ug via ORAL
  Filled 2019-10-11 (×5): qty 1

## 2019-10-11 MED ORDER — DOFETILIDE 250 MCG PO CAPS
250.0000 ug | ORAL_CAPSULE | Freq: Two times a day (BID) | ORAL | Status: DC
  Administered 2019-10-11 – 2019-10-16 (×10): 250 ug via ORAL
  Filled 2019-10-11 (×11): qty 1

## 2019-10-11 MED ORDER — ASPIRIN EC 81 MG PO TBEC
81.0000 mg | DELAYED_RELEASE_TABLET | Freq: Every day | ORAL | Status: DC
  Administered 2019-10-11 – 2019-10-16 (×6): 81 mg via ORAL
  Filled 2019-10-11 (×6): qty 1

## 2019-10-11 MED ORDER — ALBUTEROL SULFATE HFA 108 (90 BASE) MCG/ACT IN AERS: 1.0000 | INHALATION_SPRAY | Freq: Four times a day (QID) | RESPIRATORY_TRACT | Status: DC | PRN

## 2019-10-11 MED ORDER — AMLODIPINE BESYLATE 5 MG PO TABS
5.0000 mg | ORAL_TABLET | Freq: Every day | ORAL | Status: DC
  Administered 2019-10-11 – 2019-10-16 (×6): 5 mg via ORAL
  Filled 2019-10-11 (×6): qty 1

## 2019-10-11 MED ORDER — BENZONATATE 100 MG PO CAPS
100.0000 mg | ORAL_CAPSULE | Freq: Three times a day (TID) | ORAL | Status: DC
  Administered 2019-10-11 – 2019-10-16 (×16): 100 mg via ORAL
  Filled 2019-10-11 (×16): qty 1

## 2019-10-11 MED ORDER — CITALOPRAM HYDROBROMIDE 20 MG PO TABS
40.0000 mg | ORAL_TABLET | Freq: Every day | ORAL | Status: DC
  Administered 2019-10-11 – 2019-10-16 (×6): 40 mg via ORAL
  Filled 2019-10-11 (×6): qty 2

## 2019-10-11 MED ORDER — DEXAMETHASONE SODIUM PHOSPHATE 10 MG/ML IJ SOLN
6.0000 mg | INTRAMUSCULAR | Status: DC
  Administered 2019-10-11 – 2019-10-15 (×5): 6 mg via INTRAVENOUS
  Filled 2019-10-11 (×5): qty 1

## 2019-10-11 MED ORDER — CLOPIDOGREL BISULFATE 75 MG PO TABS
75.0000 mg | ORAL_TABLET | Freq: Every day | ORAL | Status: DC
  Administered 2019-10-12 – 2019-10-16 (×5): 75 mg via ORAL
  Filled 2019-10-11 (×5): qty 1

## 2019-10-11 NOTE — ED Notes (Signed)
Attempted to call [facility]. No answer.

## 2019-10-11 NOTE — Progress Notes (Signed)
PROGRESS NOTE    [name]  OEV:[id number] DOB: 1940-05-08 DOA: 10/10/2019 PCP: Horald Pollen, MD    Brief Narrative:  79 year old female with history of coronary artery disease, chronic kidney disease stage IIIa, coronary artery disease status post CABG in 2017, chronic atrial fibrillation and sick sinus syndrome status post pacemaker, patient is on Tikosyn, depression, hypertension hyperlipidemia diagnosed with COVID-19 infection about a week ago and managed at home.  Worsening shortness of breath for last 2 days, fatigue and fever came to the ER.  In the emergency room, temperature 102, blood pressure stable.  88% on room air.  Chest x-ray with groundglass airspace disease within the right midlung zone.  CTA multifocal bilateral airspace disease.  Admitted due to hypoxemia.   Assessment & Plan:   Principal Problem:   Acute respiratory failure due to COVID-19 [facility]) Active Problems:   Anxiety   Essential hypertension   CAD (coronary artery disease)   CKD (chronic kidney disease), stage II   Dyslipidemia   Hypothyroidism (acquired)   Hyponatremia  Acute hypoxemic respiratory failure due to COVID-19 pneumonia: COVID-19 Labs  Recent Labs    10/10/19 1921 10/11/19 0250  DDIMER 1.09* 1.05*  FERRITIN 343* 331*  LDH 169  --   CRP 6.1* 6.5*    Lab Results  Component Value Date   SARSCOV2NAA POSITIVE (A) 10/10/2019   [place] Not Detected 04/24/2019   [place] Not Detected 03/13/2019   [place] NEGATIVE 10/03/2018   SpO2: 91 % O2 Flow Rate (L/min): 2 L/min  Continue to monitor due to significant symptoms  chest physiotherapy, incentive spirometry, deep breathing exercises, sputum induction, mucolytic's and bronchodilators. Supplemental oxygen to keep saturations more than 90%. Covid directed therapy with , steroids, on dexamethasone remdesivir, day 2/5 actemra, not indicated convalescent plasma, not indicated antibiotics, started on azithromycin.   Procalcitonin less than 0.1.  Will discontinue. Due to severity of symptoms, patient will need daily inflammatory markers, chest x-rays, liver function test to monitor and direct COVID-19 therapies.  Coronary artery disease: Stable.  Continue aspirin and Plavix.  Hypertension: Stable.  Resume amlodipine.  Chronic kidney disease stage II: Stable.  Hyponatremia: Gradually improving.  Discontinue half-normal saline.  Will encourage oral intake.  Hypothyroidism: On levothyroxine  Hyperlipidemia: On a statin  Chronic atrial fibrillation: Rate controlled.  Currently sinus rhythm on Tikosyn.  On aspirin and Plavix.  Not on therapeutic anticoagulation.  She has a pacemaker.  DVT prophylaxis: enoxaparin (LOVENOX) injection 40 mg Start: 10/11/19 1000   Code Status: Full code Family Communication: Patient's daughter on the phone. Disposition Plan: Status is: Inpatient  Remains inpatient appropriate because:Inpatient level of care appropriate due to severity of illness   Dispo: The patient is from: Home              Anticipated d/c is to: Home              Anticipated d/c date is: 2 days              Patient currently is not medically stable to d/c.         Consultants:   None  Procedures:   None  Antimicrobials:  Antibiotics Given (last 72 hours)    None         Subjective: Patient seen and examined.  He still in the emergency room waiting for inpatient bed.  She complained of cough and difficulty to take deep breaths.  Remains afebrile. Patient complains of chronic cough for more than  2 years.  Her main problem was getting weak and fatigue for about a week. Patient was asking when she can take COVID-19 vaccine.  Objective: Vitals:   10/11/19 1330 10/11/19 1345 10/11/19 1400 10/11/19 1500  BP:   119/75 133/65  Pulse: 80 76 75 77  Resp: 20 (!) 24 (!) 22 (!) 22  Temp:      TempSrc:      SpO2: 92% 95% 92% 91%  Weight:      Height:        Intake/Output  Summary (Last 24 hours) at 10/11/2019 1510 Last data filed at 10/11/2019 1347 Gross per 24 hour  Intake 1450 ml  Output --  Net 1450 ml   Filed Weights   10/10/19 1841  Weight: 69.9 kg    Examination:  General exam: Appears calm and comfortable  Anxious but not in any distress. Respiratory system: Clear to auscultation. Respiratory effort normal.  Conducted airway sounds. Cardiovascular system: S1 & S2 heard, RRR. No JVD, murmurs, rubs, gallops or clicks. No pedal edema. Pacemaker in place. Gastrointestinal system: Abdomen is nondistended, soft and nontender. No organomegaly or masses felt. Normal bowel sounds heard. Central nervous system: Alert and oriented. No focal neurological deficits. Extremities: Symmetric 5 x 5 power. Skin: No rashes, lesions or ulcers Psychiatry: Judgement and insight appear normal. Mood & affect appropriate.     Data Reviewed: I have personally reviewed following labs and imaging studies  CBC: Recent Labs  Lab 10/10/19 1921 10/11/19 0250  WBC 4.1 5.0  NEUTROABS 2.9 3.7  HGB 13.4 13.6  HCT 39.0 40.0  MCV 89.4 89.5  PLT 140* 694*   Basic Metabolic Panel: Recent Labs  Lab 10/10/19 1921 10/11/19 0250  NA 127* 131*  K 3.8 4.6  CL 97* 99  CO2 21* 20*  GLUCOSE 137* 139*  BUN 24* 21  CREATININE 0.98 0.97  CALCIUM 8.0* 8.2*  MG  --  2.0  PHOS  --  3.2   GFR: Estimated Creatinine Clearance: 45.1 mL/min (by C-G formula based on SCr of 0.97 mg/dL). Liver Function Tests: Recent Labs  Lab 10/10/19 1921 10/11/19 0250  AST 26 31  ALT 15 14  ALKPHOS 78 84  BILITOT 0.4 0.6  PROT 6.4* 6.8  ALBUMIN 3.5 3.5   No results for input(s): LIPASE, AMYLASE in the last 168 hours. No results for input(s): AMMONIA in the last 168 hours. Coagulation Profile: No results for input(s): INR, PROTIME in the last 168 hours. Cardiac Enzymes: No results for input(s): CKTOTAL, CKMB, CKMBINDEX, TROPONINI in the last 168 hours. BNP (last 3 results) No  results for input(s): PROBNP in the last 8760 hours. HbA1C: No results for input(s): HGBA1C in the last 72 hours. CBG: No results for input(s): GLUCAP in the last 168 hours. Lipid Profile: Recent Labs    10/10/19 1921  TRIG 83   Thyroid Function Tests: No results for input(s): TSH, T4TOTAL, FREET4, T3FREE, THYROIDAB in the last 72 hours. Anemia Panel: Recent Labs    10/10/19 1921 10/11/19 0250  FERRITIN 343* 331*   Sepsis Labs: Recent Labs  Lab 10/10/19 1921 10/10/19 2159  PROCALCITON <0.10  --   LATICACIDVEN 1.0 0.8    Recent Results (from the past 240 hour(s))  SARS Coronavirus 2 by RT PCR (hospital order, performed in [facility] hospital lab) Nasopharyngeal Nasopharyngeal Swab     Status: Abnormal   Collection Time: 10/10/19  7:21 PM   Specimen: Nasopharyngeal Swab  Result Value Ref Range Status  SARS Coronavirus 2 POSITIVE (A) NEGATIVE Final    Comment: RESULT CALLED TO, READ BACK BY AND VERIFIED WITH: HODGES,I. RN @2104  ON 08.31.2021 BY COHEN,K (NOTE) SARS-CoV-2 target nucleic acids are DETECTED  SARS-CoV-2 RNA is generally detectable in upper respiratory specimens  during the acute phase of infection.  Positive results are indicative  of the presence of the identified virus, but do not rule out bacterial infection or co-infection with other pathogens not detected by the test.  Clinical correlation with patient history and  other diagnostic information is necessary to determine patient infection status.  The expected result is negative.  Fact Sheet for Patients:   StrictlyIdeas.no   Fact Sheet for Healthcare Providers:   BankingDealers.co.za    This test is not yet approved or cleared by the [place] FDA and  has been authorized for detection and/or diagnosis of SARS-CoV-2 by FDA under an Emergency Use Authorization (EUA).  This EUA will remain in effect (meanin g this test can be used) for the duration  of  the COVID-19 declaration under Section 564(b)(1) of the Act, 21 U.S.C. section 360-bbb-3(b)(1), unless the authorization is terminated or revoked sooner.  Performed at [facility], [place] [address], [place], [place] [address]   Blood Culture (routine x 2)     Status: None (Preliminary result)   Collection Time: 10/10/19  7:21 PM   Specimen: BLOOD  Result Value Ref Range Status   Specimen Description   Final    BLOOD LEFT ANTECUBITAL Performed at [facility] [address], [place], [place] [address]    Special Requests   Final    BOTTLES DRAWN AEROBIC AND ANAEROBIC Blood Culture adequate volume Performed at [facility] [address], [place], [place] [address]    Culture   Final    NO GROWTH < 12 HOURS Performed at [facility] [address] [place], [place] [address]    Report Status PENDING  Incomplete  Blood Culture (routine x 2)     Status: None (Preliminary result)   Collection Time: 10/10/19  7:40 PM   Specimen: BLOOD  Result Value Ref Range Status   Specimen Description   Final    BLOOD BLOOD LEFT HAND Performed at [facility] [address], [place], [place] [address]    Special Requests   Final    BOTTLES DRAWN AEROBIC AND ANAEROBIC Blood Culture adequate volume Performed at [facility] [address] [place], [place] [address]    Culture   Final    NO GROWTH < 12 HOURS Performed at [facility] [address], [place], [place] [address]    Report Status PENDING  Incomplete         Radiology Studies: CT Angio Chest PE W/Cm &/Or Wo Cm  Result Date: 10/10/2019 CLINICAL DATA:  COVID-19 positive 6 days ago, cough, fatigue, decreased p.o. intake EXAM: CT ANGIOGRAPHY CHEST WITH CONTRAST TECHNIQUE: Multidetector CT imaging of the chest was performed using the standard protocol during bolus administration of intravenous contrast. Multiplanar  CT image reconstructions and MIPs were obtained to evaluate the vascular anatomy. CONTRAST:  48mL OMNIPAQUE IOHEXOL 350 MG/ML SOLN COMPARISON:  08/26/2015, 10/10/2019 FINDINGS: Cardiovascular: This is a technically adequate evaluation of the pulmonary vasculature. There are no filling defects or pulmonary emboli. The heart is unremarkable without pericardial effusion. Postsurgical changes are seen from previous bypass surgery. Dual lead pacer again noted. Dilatation of the ascending thoracic  aorta measuring up to 3.9 cm, stable since prior exam. Mild diffuse atherosclerosis. Mediastinum/Nodes: No enlarged mediastinal, hilar, or axillary lymph nodes. Thyroid gland, trachea, and esophagus demonstrate no significant findings. Lungs/Pleura: There is multifocal bilateral ground-glass airspace disease, most pronounced within the right upper lobe corresponding to chest x-ray findings. Overall the appearance is consistent with COVID-19 pneumonia. No effusion or pneumothorax. Central airways are patent. Upper Abdomen: Stable right adrenal nodule likely an adenoma. No acute upper abdominal process. Musculoskeletal: No acute or destructive bony lesions. Reconstructed images demonstrate no additional findings. Review of the MIP images confirms the above findings. IMPRESSION: 1. No evidence of pulmonary embolus. 2. Multifocal bilateral airspace disease consistent with COVID 19 pneumonia. 3.  Aortic Atherosclerosis (ICD10-I70.0). Electronically Signed   By: Randa Ngo M.D.   On: 10/10/2019 21:31   DG Chest Port 1 View  Result Date: 10/10/2019 CLINICAL DATA:  COVID-19 positive, increasing shortness of breath EXAM: PORTABLE CHEST 1 VIEW COMPARISON:  09/05/2019 FINDINGS: Single frontal view of the chest demonstrates interval development of ground-glass airspace disease within the right midlung zone. Background interstitial prominence is unchanged. No effusion or pneumothorax. The cardiac silhouette is stable, with  postsurgical changes from prior CABG. Dual lead pacer is unchanged. No acute bony abnormalities. IMPRESSION: 1. Interval development of ground-glass airspace disease within the right midlung zone, which could reflect early pneumonia. Electronically Signed   By: Randa Ngo M.D.   On: 10/10/2019 19:59        Scheduled Meds: . albuterol  2 puff Inhalation Q6H  . vitamin C  500 mg Oral Daily  . dexamethasone (DECADRON) injection  6 mg Intravenous Q24H  . enoxaparin (LOVENOX) injection  40 mg Subcutaneous Q24H  . zinc sulfate  220 mg Oral Daily   Continuous Infusions: . [START ON 10/12/2019] remdesivir 100 mg in NS 100 mL       LOS: 1 day    Time spent: 30 minutes    Barb Merino, MD [facility] Pager [phone]

## 2019-10-12 LAB — COMPREHENSIVE METABOLIC PANEL
ALT: 15 U/L (ref 0–44)
AST: 23 U/L (ref 15–41)
Albumin: 3.3 g/dL — ABNORMAL LOW (ref 3.5–5.0)
Alkaline Phosphatase: 81 U/L (ref 38–126)
Anion gap: 7 (ref 5–15)
BUN: 19 mg/dL (ref 8–23)
CO2: 24 mmol/L (ref 22–32)
Calcium: 8.1 mg/dL — ABNORMAL LOW (ref 8.9–10.3)
Chloride: 103 mmol/L (ref 98–111)
Creatinine, Ser: 0.65 mg/dL (ref 0.44–1.00)
GFR calc Af Amer: >60 mL/min (ref >60)
GFR calc non Af Amer: >60 mL/min (ref >60)
Glucose, Bld: 168 mg/dL — ABNORMAL HIGH (ref 70–99)
Potassium: 4.6 mmol/L (ref 3.5–5.1)
Sodium: 134 mmol/L — ABNORMAL LOW (ref 135–145)
Total Bilirubin: 0.3 mg/dL (ref 0.3–1.2)
Total Protein: 6.4 g/dL — ABNORMAL LOW (ref 6.5–8.1)

## 2019-10-12 LAB — CBC WITH DIFFERENTIAL/PLATELET
Abs Immature Granulocytes: 0.01 10*3/uL (ref 0.00–0.07)
Basophils Absolute: 0 10*3/uL (ref 0.0–0.1)
Basophils Relative: 0 %
Eosinophils Absolute: 0 10*3/uL (ref 0.0–0.5)
Eosinophils Relative: 0 %
HCT: 40 % (ref 36.0–46.0)
Hemoglobin: 13.5 g/dL (ref 12.0–15.0)
Immature Granulocytes: 0 %
Lymphocytes Relative: 30 %
Lymphs Abs: 0.8 10*3/uL (ref 0.7–4.0)
MCH: 30.3 pg (ref 26.0–34.0)
MCHC: 33.8 g/dL (ref 30.0–36.0)
MCV: 89.7 fL (ref 80.0–100.0)
Monocytes Absolute: 0.3 10*3/uL (ref 0.1–1.0)
Monocytes Relative: 10 %
Neutro Abs: 1.5 10*3/uL — ABNORMAL LOW (ref 1.7–7.7)
Neutrophils Relative %: 60 %
Platelets: 149 10*3/uL — ABNORMAL LOW (ref 150–400)
RBC: 4.46 MIL/uL (ref 3.87–5.11)
RDW: 12.2 % (ref 11.5–15.5)
WBC: 2.6 10*3/uL — ABNORMAL LOW (ref 4.0–10.5)
nRBC: 0 % (ref 0.0–0.2)

## 2019-10-12 LAB — D-DIMER, QUANTITATIVE: D-Dimer, Quant: 0.82 ug/mL-FEU — ABNORMAL HIGH (ref 0.00–0.50)

## 2019-10-12 LAB — FERRITIN: Ferritin: 343 ng/mL — ABNORMAL HIGH (ref 11–307)

## 2019-10-12 LAB — MAGNESIUM: Magnesium: 2.1 mg/dL (ref 1.7–2.4)

## 2019-10-12 LAB — C-REACTIVE PROTEIN: CRP: 3.9 mg/dL — ABNORMAL HIGH (ref <1.0)

## 2019-10-12 LAB — PHOSPHORUS: Phosphorus: 3.5 mg/dL (ref 2.5–4.6)

## 2019-10-12 NOTE — Progress Notes (Signed)
Pt is resting well post medication meds PRN medication given for cough and found that it worked for her to reduce cough and feel better- ambulatory - oxygen remains on 2- 3 liters humidified oxygen to help with nasal passages

## 2019-10-12 NOTE — Progress Notes (Signed)
PROGRESS NOTE    [name]  ATF:[id number] DOB: [birth year] DOA: 10/10/2019 PCP: Horald Pollen, MD    Brief Narrative:  79 year old female with history of coronary artery disease, chronic kidney disease stage IIIa, coronary artery disease status post CABG in 2017, chronic atrial fibrillation and sick sinus syndrome status post pacemaker, patient is on Tikosyn, depression, hypertension hyperlipidemia diagnosed with COVID-19 infection about a week ago and managed at home.  Worsening shortness of breath for last 2 days, fatigue and fever came to the ER.  In the emergency room, temperature 102, blood pressure stable.  88% on room air.  Chest x-ray with groundglass airspace disease within the right midlung zone.  CTA multifocal bilateral airspace disease.  Admitted due to hypoxemia.   Assessment & Plan:   Principal Problem:   Acute respiratory failure due to COVID-19 [facility]) Active Problems:   Anxiety   Essential hypertension   CAD (coronary artery disease)   CKD (chronic kidney disease), stage II   Dyslipidemia   Hypothyroidism (acquired)   Hyponatremia  Acute hypoxemic respiratory failure due to COVID-19 pneumonia: COVID-19 Labs  Recent Labs    10/10/19 1921 10/11/19 0250 10/12/19 0342  DDIMER 1.09* 1.05* 0.82*  FERRITIN 343* 331* 343*  LDH 169  --   --   CRP 6.1* 6.5* 3.9*    Lab Results  Component Value Date   SARSCOV2NAA POSITIVE (A) 10/10/2019   [place] Not Detected 04/24/2019   [place] Not Detected 03/13/2019   [place] NEGATIVE 10/03/2018   SpO2: 94 % O2 Flow Rate (L/min): 2 L/min  Continue to monitor due to significant symptoms  chest physiotherapy, incentive spirometry, deep breathing exercises, sputum induction, mucolytic's and bronchodilators. Supplemental oxygen to keep saturations more than 90%. Covid directed therapy with , steroids, on dexamethasone remdesivir, day 3/5 actemra, not indicated convalescent plasma, not indicated  Due to severity of symptoms, patient will need daily inflammatory markers, chest x-rays, liver function test to monitor and direct COVID-19 therapies.  Coronary artery disease: Stable.  Continue aspirin and Plavix.  Hypertension: Stable.  Resume amlodipine.  Chronic kidney disease stage II: Stable.  Hyponatremia: Gradually improving.  Will encourage oral intake.  Hypothyroidism: On levothyroxine  Hyperlipidemia: On a statin  Chronic atrial fibrillation: Rate controlled.  Currently sinus rhythm on Tikosyn.  On aspirin and Plavix.  Not on therapeutic anticoagulation.  She has a pacemaker.  DVT prophylaxis: enoxaparin (LOVENOX) injection 40 mg Start: 10/11/19 1000   Code Status: Full code Family Communication: none  Disposition Plan: Status is: Inpatient  Remains inpatient appropriate because:Inpatient level of care appropriate due to severity of illness   Dispo: The patient is from: Home              Anticipated d/c is to: Home              Anticipated d/c date is: 2 days              Patient currently is not medically stable to d/c.         Consultants:   None  Procedures:   None  Antimicrobials:  Antibiotics Given (last 72 hours)    Date/Time Action Medication Dose Rate   10/12/19 0958 New Bag/Given   remdesivir 100 mg in sodium chloride 0.9 % 100 mL IVPB 100 mg 200 mL/hr         Subjective: Patient seen and examined.  No overnight events.  Continues to have cough and difficulty taking deep breaths in.  On  2 L oxygen.  Objective: Vitals:   10/11/19 1952 10/11/19 2333 10/12/19 0344 10/12/19 1347  BP: 121/67 (!) 104/57 119/68 121/64  Pulse: 78 76 74 75  Resp: 18 15 18 17   Temp: 98.2 F (36.8 C) (!) 97.5 F (36.4 C) 98.2 F (36.8 C) 98 F (36.7 C)  TempSrc: Oral  Oral Oral  SpO2: 92% 94% 94% 94%  Weight:      Height:        Intake/Output Summary (Last 24 hours) at 10/12/2019 1353 Last data filed at 10/12/2019 1232 Gross per 24 hour  Intake 610  ml  Output -  Net 610 ml   Filed Weights   10/10/19 1841  Weight: 69.9 kg    Examination:  General exam: Appears calm and comfortable.  Mildly anxious. Respiratory system: Clear to auscultation. Respiratory effort normal.  Conducted airway sounds. Cardiovascular system: S1 & S2 heard, RRR. No JVD, murmurs, rubs, gallops or clicks. No pedal edema. Pacemaker in place. Gastrointestinal system: Abdomen is nondistended, soft and nontender. No organomegaly or masses felt. Normal bowel sounds heard. Central nervous system: Alert and oriented. No focal neurological deficits. Extremities: Symmetric 5 x 5 power. Skin: No rashes, lesions or ulcers Psychiatry: Judgement and insight appear normal. Mood & affect appropriate.     Data Reviewed: I have personally reviewed following labs and imaging studies  CBC: Recent Labs  Lab 10/10/19 1921 10/11/19 0250 10/12/19 0342  WBC 4.1 5.0 2.6*  NEUTROABS 2.9 3.7 1.5*  HGB 13.4 13.6 13.5  HCT 39.0 40.0 40.0  MCV 89.4 89.5 89.7  PLT 140* 145* 761*   Basic Metabolic Panel: Recent Labs  Lab 10/10/19 1921 10/11/19 0250 10/12/19 0342  NA 127* 131* 134*  K 3.8 4.6 4.6  CL 97* 99 103  CO2 21* 20* 24  GLUCOSE 137* 139* 168*  BUN 24* 21 19  CREATININE 0.98 0.97 0.65  CALCIUM 8.0* 8.2* 8.1*  MG  --  2.0 2.1  PHOS  --  3.2 3.5   GFR: Estimated Creatinine Clearance: 54.7 mL/min (by C-G formula based on SCr of 0.65 mg/dL). Liver Function Tests: Recent Labs  Lab 10/10/19 1921 10/11/19 0250 10/12/19 0342  AST 26 31 23   ALT 15 14 15   ALKPHOS 78 84 81  BILITOT 0.4 0.6 0.3  PROT 6.4* 6.8 6.4*  ALBUMIN 3.5 3.5 3.3*   No results for input(s): LIPASE, AMYLASE in the last 168 hours. No results for input(s): AMMONIA in the last 168 hours. Coagulation Profile: No results for input(s): INR, PROTIME in the last 168 hours. Cardiac Enzymes: No results for input(s): CKTOTAL, CKMB, CKMBINDEX, TROPONINI in the last 168 hours. BNP (last 3  results) No results for input(s): PROBNP in the last 8760 hours. HbA1C: No results for input(s): HGBA1C in the last 72 hours. CBG: No results for input(s): GLUCAP in the last 168 hours. Lipid Profile: Recent Labs    10/10/19 1921  TRIG 83   Thyroid Function Tests: No results for input(s): TSH, T4TOTAL, FREET4, T3FREE, THYROIDAB in the last 72 hours. Anemia Panel: Recent Labs    10/11/19 0250 10/12/19 0342  FERRITIN 331* 343*   Sepsis Labs: Recent Labs  Lab 10/10/19 1921 10/10/19 2159  PROCALCITON <0.10  --   LATICACIDVEN 1.0 0.8    Recent Results (from the past 240 hour(s))  SARS Coronavirus 2 by RT PCR (hospital order, performed in [facility] hospital lab) Nasopharyngeal Nasopharyngeal Swab     Status: Abnormal   Collection Time: 10/10/19  7:21 PM   Specimen: Nasopharyngeal Swab  Result Value Ref Range Status   SARS Coronavirus 2 POSITIVE (A) NEGATIVE Final    Comment: RESULT CALLED TO, READ BACK BY AND VERIFIED WITH: HODGES,I. RN @2104  ON 08.31.2021 BY COHEN,K (NOTE) SARS-CoV-2 target nucleic acids are DETECTED  SARS-CoV-2 RNA is generally detectable in upper respiratory specimens  during the acute phase of infection.  Positive results are indicative  of the presence of the identified virus, but do not rule out bacterial infection or co-infection with other pathogens not detected by the test.  Clinical correlation with patient history and  other diagnostic information is necessary to determine patient infection status.  The expected result is negative.  Fact Sheet for Patients:   StrictlyIdeas.no   Fact Sheet for Healthcare Providers:   BankingDealers.co.za    This test is not yet approved or cleared by the [place] FDA and  has been authorized for detection and/or diagnosis of SARS-CoV-2 by FDA under an Emergency Use Authorization (EUA).  This EUA will remain in effect (meanin g this test can be used) for  the duration of  the COVID-19 declaration under Section 564(b)(1) of the Act, 21 U.S.C. section 360-bbb-3(b)(1), unless the authorization is terminated or revoked sooner.  Performed at [facility], [place] [address] [place], [place] [address]   Blood Culture (routine x 2)     Status: None (Preliminary result)   Collection Time: 10/10/19  7:21 PM   Specimen: BLOOD  Result Value Ref Range Status   Specimen Description   Final    BLOOD LEFT ANTECUBITAL Performed at [place] [address] [place], [place] [address]    Special Requests   Final    BOTTLES DRAWN AEROBIC AND ANAEROBIC Blood Culture adequate volume Performed at [place] [address] [place], [place] [address]    Culture   Final    NO GROWTH 2 DAYS Performed at [place] [address], [place], [place] [address]    Report Status PENDING  Incomplete  Blood Culture (routine x 2)     Status: None (Preliminary result)   Collection Time: 10/10/19  7:40 PM   Specimen: BLOOD  Result Value Ref Range Status   Specimen Description   Final    BLOOD BLOOD LEFT HAND Performed at [place] [address] [place], [place] [address]    Special Requests   Final    BOTTLES DRAWN AEROBIC AND ANAEROBIC Blood Culture adequate volume Performed at [facility] [address], [place], [place] [address]    Culture   Final    NO GROWTH 2 DAYS Performed at [place] [address] [place], [place] [address]    Report Status PENDING  Incomplete         Radiology Studies: CT Angio Chest PE W/Cm &/Or Wo Cm  Result Date: 10/10/2019 CLINICAL DATA:  COVID-19 positive 6 days ago, cough, fatigue, decreased p.o. intake EXAM: CT ANGIOGRAPHY CHEST WITH CONTRAST TECHNIQUE: Multidetector CT imaging of the chest was performed using the standard protocol during bolus administration of intravenous contrast.  Multiplanar CT image reconstructions and MIPs were obtained to evaluate the vascular anatomy. CONTRAST:  72mL OMNIPAQUE IOHEXOL 350 MG/ML SOLN COMPARISON:  08/26/2015, 10/10/2019 FINDINGS: Cardiovascular: This is a technically adequate evaluation of the pulmonary vasculature. There are no filling defects or pulmonary emboli. The heart is unremarkable without pericardial effusion. Postsurgical changes are seen from  previous bypass surgery. Dual lead pacer again noted. Dilatation of the ascending thoracic aorta measuring up to 3.9 cm, stable since prior exam. Mild diffuse atherosclerosis. Mediastinum/Nodes: No enlarged mediastinal, hilar, or axillary lymph nodes. Thyroid gland, trachea, and esophagus demonstrate no significant findings. Lungs/Pleura: There is multifocal bilateral ground-glass airspace disease, most pronounced within the right upper lobe corresponding to chest x-ray findings. Overall the appearance is consistent with COVID-19 pneumonia. No effusion or pneumothorax. Central airways are patent. Upper Abdomen: Stable right adrenal nodule likely an adenoma. No acute upper abdominal process. Musculoskeletal: No acute or destructive bony lesions. Reconstructed images demonstrate no additional findings. Review of the MIP images confirms the above findings. IMPRESSION: 1. No evidence of pulmonary embolus. 2. Multifocal bilateral airspace disease consistent with COVID 19 pneumonia. 3.  Aortic Atherosclerosis (ICD10-I70.0). Electronically Signed   By: Randa Ngo M.D.   On: 10/10/2019 21:31   DG Chest Port 1 View  Result Date: 10/10/2019 CLINICAL DATA:  COVID-19 positive, increasing shortness of breath EXAM: PORTABLE CHEST 1 VIEW COMPARISON:  09/05/2019 FINDINGS: Single frontal view of the chest demonstrates interval development of ground-glass airspace disease within the right midlung zone. Background interstitial prominence is unchanged. No effusion or pneumothorax. The cardiac silhouette is stable,  with postsurgical changes from prior CABG. Dual lead pacer is unchanged. No acute bony abnormalities. IMPRESSION: 1. Interval development of ground-glass airspace disease within the right midlung zone, which could reflect early pneumonia. Electronically Signed   By: Randa Ngo M.D.   On: 10/10/2019 19:59        Scheduled Meds: . albuterol  2 puff Inhalation Q6H  . amLODipine  5 mg Oral Daily  . vitamin C  500 mg Oral Daily  . aspirin EC  81 mg Oral Daily  . benzonatate  100 mg Oral Q8H  . citalopram  40 mg Oral Daily  . clopidogrel  75 mg Oral Q breakfast  . dexamethasone (DECADRON) injection  6 mg Intravenous Q24H  . dofetilide  250 mcg Oral BID  . enoxaparin (LOVENOX) injection  40 mg Subcutaneous Q24H  . levothyroxine  25 mcg Oral QAC breakfast  . zinc sulfate  220 mg Oral Daily   Continuous Infusions: . remdesivir 100 mg in NS 100 mL 100 mg (10/12/19 0958)     LOS: 2 days    Time spent: 30 minutes    Barb Merino, MD [facility] Pager [phone]

## 2019-10-13 LAB — CBC WITH DIFFERENTIAL/PLATELET
Abs Immature Granulocytes: 0.02 10*3/uL (ref 0.00–0.07)
Basophils Absolute: 0 10*3/uL (ref 0.0–0.1)
Basophils Relative: 0 %
Eosinophils Absolute: 0 10*3/uL (ref 0.0–0.5)
Eosinophils Relative: 0 %
HCT: 41.5 % (ref 36.0–46.0)
Hemoglobin: 14.1 g/dL (ref 12.0–15.0)
Immature Granulocytes: 1 %
Lymphocytes Relative: 22 %
Lymphs Abs: 0.7 10*3/uL (ref 0.7–4.0)
MCH: 30.3 pg (ref 26.0–34.0)
MCHC: 34 g/dL (ref 30.0–36.0)
MCV: 89.2 fL (ref 80.0–100.0)
Monocytes Absolute: 0.3 10*3/uL (ref 0.1–1.0)
Monocytes Relative: 10 %
Neutro Abs: 2.1 10*3/uL (ref 1.7–7.7)
Neutrophils Relative %: 67 %
Platelets: 183 10*3/uL (ref 150–400)
RBC: 4.65 MIL/uL (ref 3.87–5.11)
RDW: 12.2 % (ref 11.5–15.5)
WBC: 3.2 10*3/uL — ABNORMAL LOW (ref 4.0–10.5)
nRBC: 0 % (ref 0.0–0.2)

## 2019-10-13 LAB — COMPREHENSIVE METABOLIC PANEL
ALT: 15 U/L (ref 0–44)
AST: 25 U/L (ref 15–41)
Albumin: 3.2 g/dL — ABNORMAL LOW (ref 3.5–5.0)
Alkaline Phosphatase: 83 U/L (ref 38–126)
Anion gap: 10 (ref 5–15)
BUN: 18 mg/dL (ref 8–23)
CO2: 23 mmol/L (ref 22–32)
Calcium: 8 mg/dL — ABNORMAL LOW (ref 8.9–10.3)
Chloride: 101 mmol/L (ref 98–111)
Creatinine, Ser: 0.59 mg/dL (ref 0.44–1.00)
GFR calc Af Amer: >60 mL/min (ref >60)
GFR calc non Af Amer: >60 mL/min (ref >60)
Glucose, Bld: 161 mg/dL — ABNORMAL HIGH (ref 70–99)
Potassium: 4.4 mmol/L (ref 3.5–5.1)
Sodium: 134 mmol/L — ABNORMAL LOW (ref 135–145)
Total Bilirubin: 0.3 mg/dL (ref 0.3–1.2)
Total Protein: 6.2 g/dL — ABNORMAL LOW (ref 6.5–8.1)

## 2019-10-13 LAB — C-REACTIVE PROTEIN: CRP: 1.8 mg/dL — ABNORMAL HIGH (ref <1.0)

## 2019-10-13 LAB — D-DIMER, QUANTITATIVE: D-Dimer, Quant: 0.7 ug/mL-FEU — ABNORMAL HIGH (ref 0.00–0.50)

## 2019-10-13 LAB — MAGNESIUM: Magnesium: 2.1 mg/dL (ref 1.7–2.4)

## 2019-10-13 LAB — FERRITIN: Ferritin: 323 ng/mL — ABNORMAL HIGH (ref 11–307)

## 2019-10-13 LAB — PHOSPHORUS: Phosphorus: 3.2 mg/dL (ref 2.5–4.6)

## 2019-10-13 NOTE — Care Management Important Message (Signed)
Important Message  Patient Details IM Letter given to the Patient Name: [name] MRN: [id number] Date of Birth: 27-Jun-1940   Medicare Important Message Given:  Yes     Kerin Salen 10/13/2019, 10:01 AM

## 2019-10-13 NOTE — Progress Notes (Signed)
PROGRESS NOTE    [name]  EXN:[id number] DOB: 11/26/1940 DOA: 10/10/2019 PCP: Horald Pollen, MD    Brief Narrative:  79 year old female with history of coronary artery disease, chronic kidney disease stage IIIa, coronary artery disease status post CABG in 2017, chronic atrial fibrillation and sick sinus syndrome status post pacemaker, patient is on Tikosyn, depression, hypertension hyperlipidemia diagnosed with COVID-19 infection about a week ago and managed at home.  Worsening shortness of breath for last 2 days, fatigue and fever came to the ER.  In the emergency room, temperature 102, blood pressure stable.  88% on room air.  Chest x-ray with groundglass airspace disease within the right midlung zone.  CTA multifocal bilateral airspace disease.  Admitted due to hypoxemia.   Assessment & Plan:   Principal Problem:   Acute respiratory failure due to COVID-19 [facility]) Active Problems:   Anxiety   Essential hypertension   CAD (coronary artery disease)   CKD (chronic kidney disease), stage II   Dyslipidemia   Hypothyroidism (acquired)   Hyponatremia  Acute hypoxemic respiratory failure due to COVID-19 pneumonia: COVID-19 Labs  Recent Labs    10/10/19 1921 10/10/19 1921 10/11/19 0250 10/12/19 0342 10/13/19 0348  DDIMER 1.09*   < > 1.05* 0.82* 0.70*  FERRITIN 343*   < > 331* 343* 323*  LDH 169  --   --   --   --   CRP 6.1*   < > 6.5* 3.9* 1.8*   < > = values in this interval not displayed.    Lab Results  Component Value Date   SARSCOV2NAA POSITIVE (A) 10/10/2019   [place] Not Detected 04/24/2019   [place] Not Detected 03/13/2019   [place] NEGATIVE 10/03/2018   SpO2: 93 % O2 Flow Rate (L/min): 1.5 L/min  Continue to monitor due to significant symptoms  chest physiotherapy, incentive spirometry, deep breathing exercises, sputum induction, mucolytic's and bronchodilators. Supplemental oxygen to keep saturations more than 90%. Covid directed  therapy with , steroids, on dexamethasone remdesivir, day 4/5 actemra, not indicated convalescent plasma, not indicated Due to severity of symptoms, patient will need daily inflammatory markers, chest x-rays, liver function test to monitor and direct COVID-19 therapies.  Coronary artery disease: Stable.  Continue aspirin and Plavix.  Hypertension: Stable.  Resumed amlodipine.  Chronic kidney disease stage II: Stable.  Hyponatremia: Gradually improving.  Will encourage oral intake.  Hypothyroidism: On levothyroxine  Hyperlipidemia: On a statin  Chronic atrial fibrillation: Rate controlled.  Currently sinus rhythm on Tikosyn.  On aspirin and Plavix.  Not on therapeutic anticoagulation.  She has a pacemaker.  DVT prophylaxis: enoxaparin (LOVENOX) injection 40 mg Start: 10/11/19 1000   Code Status: Full code Family Communication: none  Disposition Plan: Status is: Inpatient  Remains inpatient appropriate because:Inpatient level of care appropriate due to severity of illness   Dispo: The patient is from: Home              Anticipated d/c is to: Home              Anticipated d/c date is: 2 days              Patient currently is not medically stable to d/c.         Consultants:   None  Procedures:   None  Antimicrobials:  Antibiotics Given (last 72 hours)    Date/Time Action Medication Dose Rate   10/12/19 0958 New Bag/Given   remdesivir 100 mg in sodium chloride 0.9 % 100  mL IVPB 100 mg 200 mL/hr   10/13/19 0957 New Bag/Given   remdesivir 100 mg in sodium chloride 0.9 % 100 mL IVPB 100 mg 200 mL/hr         Subjective: Patient seen and examined. Feels tired otherwise no other complaints. Cough on deep breathing. Remains afebrile.  Objective: Vitals:   10/12/19 1347 10/12/19 2054 10/13/19 0417 10/13/19 1321  BP: 121/64 133/74 133/74 135/72  Pulse: 75 79 77 75  Resp: 17 18 20 20   Temp: 98 F (36.7 C) 98 F (36.7 C) 98 F (36.7 C) 98.1 F (36.7 C)    TempSrc: Oral Oral    SpO2: 94% 93% 94% 93%  Weight:      Height:        Intake/Output Summary (Last 24 hours) at 10/13/2019 1326 Last data filed at 10/13/2019 1306 Gross per 24 hour  Intake 808 ml  Output --  Net 808 ml   Filed Weights   10/10/19 1841  Weight: 69.9 kg    Examination:  Physical Exam Constitutional:      Comments: Patient is alert oriented x4. She is comfortable looking. On 1 to 2 L of oxygen. Chronically sick looking not in any distress.  HENT:     Head: Atraumatic.     Mouth/Throat:     Mouth: Mucous membranes are moist.  Cardiovascular:     Rate and Rhythm: Regular rhythm.     Comments: Pacemaker present on left precordium. Pulmonary:     Comments: Mostly upper airway sounds. She has hoarse cough. Neurological:     General: No focal deficit present.     Mental Status: She is oriented to person, place, and time.       Data Reviewed: I have personally reviewed following labs and imaging studies  CBC: Recent Labs  Lab 10/10/19 1921 10/11/19 0250 10/12/19 0342 10/13/19 0348  WBC 4.1 5.0 2.6* 3.2*  NEUTROABS 2.9 3.7 1.5* 2.1  HGB 13.4 13.6 13.5 14.1  HCT 39.0 40.0 40.0 41.5  MCV 89.4 89.5 89.7 89.2  PLT 140* 145* 149* 502   Basic Metabolic Panel: Recent Labs  Lab 10/10/19 1921 10/11/19 0250 10/12/19 0342 10/13/19 0348  NA 127* 131* 134* 134*  K 3.8 4.6 4.6 4.4  CL 97* 99 103 101  CO2 21* 20* 24 23  GLUCOSE 137* 139* 168* 161*  BUN 24* 21 19 18   CREATININE 0.98 0.97 0.65 0.59  CALCIUM 8.0* 8.2* 8.1* 8.0*  MG  --  2.0 2.1 2.1  PHOS  --  3.2 3.5 3.2   GFR: Estimated Creatinine Clearance: 54.7 mL/min (by C-G formula based on SCr of 0.59 mg/dL). Liver Function Tests: Recent Labs  Lab 10/10/19 1921 10/11/19 0250 10/12/19 0342 10/13/19 0348  AST 26 31 23 25   ALT 15 14 15 15   ALKPHOS 78 84 81 83  BILITOT 0.4 0.6 0.3 0.3  PROT 6.4* 6.8 6.4* 6.2*  ALBUMIN 3.5 3.5 3.3* 3.2*   No results for input(s): LIPASE, AMYLASE in the  last 168 hours. No results for input(s): AMMONIA in the last 168 hours. Coagulation Profile: No results for input(s): INR, PROTIME in the last 168 hours. Cardiac Enzymes: No results for input(s): CKTOTAL, CKMB, CKMBINDEX, TROPONINI in the last 168 hours. BNP (last 3 results) No results for input(s): PROBNP in the last 8760 hours. HbA1C: No results for input(s): HGBA1C in the last 72 hours. CBG: No results for input(s): GLUCAP in the last 168 hours. Lipid Profile: Recent Labs  10/10/19 1921  TRIG 83   Thyroid Function Tests: No results for input(s): TSH, T4TOTAL, FREET4, T3FREE, THYROIDAB in the last 72 hours. Anemia Panel: Recent Labs    10/12/19 0342 10/13/19 0348  FERRITIN 343* 323*   Sepsis Labs: Recent Labs  Lab 10/10/19 1921 10/10/19 2159  PROCALCITON <0.10  --   LATICACIDVEN 1.0 0.8    Recent Results (from the past 240 hour(s))  SARS Coronavirus 2 by RT PCR (hospital order, performed in [facility] hospital lab) Nasopharyngeal Nasopharyngeal Swab     Status: Abnormal   Collection Time: 10/10/19  7:21 PM   Specimen: Nasopharyngeal Swab  Result Value Ref Range Status   SARS Coronavirus 2 POSITIVE (A) NEGATIVE Final    Comment: RESULT CALLED TO, READ BACK BY AND VERIFIED WITH: HODGES,I. RN @2104  ON 08.31.2021 BY COHEN,K (NOTE) SARS-CoV-2 target nucleic acids are DETECTED  SARS-CoV-2 RNA is generally detectable in upper respiratory specimens  during the acute phase of infection.  Positive results are indicative  of the presence of the identified virus, but do not rule out bacterial infection or co-infection with other pathogens not detected by the test.  Clinical correlation with patient history and  other diagnostic information is necessary to determine patient infection status.  The expected result is negative.  Fact Sheet for Patients:   StrictlyIdeas.no   Fact Sheet for Healthcare Providers:    BankingDealers.co.za    This test is not yet approved or cleared by the [place] FDA and  has been authorized for detection and/or diagnosis of SARS-CoV-2 by FDA under an Emergency Use Authorization (EUA).  This EUA will remain in effect (meanin g this test can be used) for the duration of  the COVID-19 declaration under Section 564(b)(1) of the Act, 21 U.S.C. section 360-bbb-3(b)(1), unless the authorization is terminated or revoked sooner.  Performed at [facility], [place] [address], [place], [place] [address]   Blood Culture (routine x 2)     Status: None (Preliminary result)   Collection Time: 10/10/19  7:21 PM   Specimen: BLOOD  Result Value Ref Range Status   Specimen Description   Final    BLOOD LEFT ANTECUBITAL Performed at [facility] [place] [address], [place], [place] [address]    Special Requests   Final    BOTTLES DRAWN AEROBIC AND ANAEROBIC Blood Culture adequate volume Performed at [place] [address] [place], [place] [address]    Culture   Final    NO GROWTH 3 DAYS Performed at [facility], [place] [address] [place], [place] [address]    Report Status PENDING  Incomplete  Blood Culture (routine x 2)     Status: None (Preliminary result)   Collection Time: 10/10/19  7:40 PM   Specimen: BLOOD  Result Value Ref Range Status   Specimen Description   Final    BLOOD BLOOD LEFT HAND Performed at [place] [address] [place], [place] [address]    Special Requests   Final    BOTTLES DRAWN AEROBIC AND ANAEROBIC Blood Culture adequate volume Performed at [place] [address] [place], [place] [address]    Culture   Final    NO GROWTH 3 DAYS Performed at [facility], [place] [address] [place], [place] [address]    Report Status PENDING  Incomplete         Radiology Studies: No results  found.  Scheduled Meds: . albuterol  2 puff Inhalation Q6H  . amLODipine  5 mg Oral Daily  . vitamin C  500 mg Oral Daily  . aspirin EC  81 mg Oral Daily  . benzonatate  100 mg Oral Q8H  . citalopram  40 mg Oral Daily  . clopidogrel  75 mg Oral Q breakfast  . dexamethasone (DECADRON) injection  6 mg Intravenous Q24H  . dofetilide  250 mcg Oral BID  . enoxaparin (LOVENOX) injection  40 mg Subcutaneous Q24H  . levothyroxine  25 mcg Oral QAC breakfast  . zinc sulfate  220 mg Oral Daily   Continuous Infusions: . remdesivir 100 mg in NS 100 mL 100 mg (10/13/19 0957)     LOS: 3 days    Time spent: 30 minutes    Barb Merino, MD [facility] Pager [phone]

## 2019-10-14 LAB — CBC WITH DIFFERENTIAL/PLATELET
Abs Immature Granulocytes: 0.02 10*3/uL (ref 0.00–0.07)
Basophils Absolute: 0 10*3/uL (ref 0.0–0.1)
Basophils Relative: 0 %
Eosinophils Absolute: 0 10*3/uL (ref 0.0–0.5)
Eosinophils Relative: 0 %
HCT: 43.6 % (ref 36.0–46.0)
Hemoglobin: 14.6 g/dL (ref 12.0–15.0)
Immature Granulocytes: 1 %
Lymphocytes Relative: 17 %
Lymphs Abs: 0.6 10*3/uL — ABNORMAL LOW (ref 0.7–4.0)
MCH: 30.4 pg (ref 26.0–34.0)
MCHC: 33.5 g/dL (ref 30.0–36.0)
MCV: 90.6 fL (ref 80.0–100.0)
Monocytes Absolute: 0.3 10*3/uL (ref 0.1–1.0)
Monocytes Relative: 8 %
Neutro Abs: 2.8 10*3/uL (ref 1.7–7.7)
Neutrophils Relative %: 74 %
Platelets: 211 10*3/uL (ref 150–400)
RBC: 4.81 MIL/uL (ref 3.87–5.11)
RDW: 12.2 % (ref 11.5–15.5)
WBC: 3.8 10*3/uL — ABNORMAL LOW (ref 4.0–10.5)
nRBC: 0 % (ref 0.0–0.2)

## 2019-10-14 LAB — COMPREHENSIVE METABOLIC PANEL
ALT: 17 U/L (ref 0–44)
AST: 26 U/L (ref 15–41)
Albumin: 3.1 g/dL — ABNORMAL LOW (ref 3.5–5.0)
Alkaline Phosphatase: 82 U/L (ref 38–126)
Anion gap: 11 (ref 5–15)
BUN: 16 mg/dL (ref 8–23)
CO2: 24 mmol/L (ref 22–32)
Calcium: 8 mg/dL — ABNORMAL LOW (ref 8.9–10.3)
Chloride: 99 mmol/L (ref 98–111)
Creatinine, Ser: 0.55 mg/dL (ref 0.44–1.00)
GFR calc Af Amer: >60 mL/min (ref >60)
GFR calc non Af Amer: >60 mL/min (ref >60)
Glucose, Bld: 163 mg/dL — ABNORMAL HIGH (ref 70–99)
Potassium: 4.4 mmol/L (ref 3.5–5.1)
Sodium: 134 mmol/L — ABNORMAL LOW (ref 135–145)
Total Bilirubin: 0.5 mg/dL (ref 0.3–1.2)
Total Protein: 6.1 g/dL — ABNORMAL LOW (ref 6.5–8.1)

## 2019-10-14 LAB — PHOSPHORUS: Phosphorus: 2.9 mg/dL (ref 2.5–4.6)

## 2019-10-14 LAB — C-REACTIVE PROTEIN: CRP: 0.8 mg/dL (ref <1.0)

## 2019-10-14 LAB — MAGNESIUM: Magnesium: 2 mg/dL (ref 1.7–2.4)

## 2019-10-14 LAB — D-DIMER, QUANTITATIVE: D-Dimer, Quant: 0.59 ug/mL-FEU — ABNORMAL HIGH (ref 0.00–0.50)

## 2019-10-14 LAB — FERRITIN: Ferritin: 250 ng/mL (ref 11–307)

## 2019-10-14 NOTE — Progress Notes (Signed)
PROGRESS NOTE    [name]  CBS:[id number] DOB: 1940/06/08 DOA: 10/10/2019 PCP: Horald Pollen, MD    Brief Narrative:  79 year old female with history of coronary artery disease, chronic kidney disease stage IIIa, coronary artery disease status post CABG in 2017, chronic atrial fibrillation and sick sinus syndrome status post pacemaker, patient is on Tikosyn, depression, hypertension hyperlipidemia diagnosed with COVID-19 infection about a week ago and managed at home.  Worsening shortness of breath for last 2 days, fatigue and fever came to the ER.  In the emergency room, temperature 102, blood pressure stable.  88% on room air.  Chest x-ray with groundglass airspace disease within the right midlung zone.  CTA multifocal bilateral airspace disease.  Admitted due to hypoxemia.   Assessment & Plan:   Principal Problem:   Acute respiratory failure due to COVID-19 [facility]) Active Problems:   Anxiety   Essential hypertension   CAD (coronary artery disease)   CKD (chronic kidney disease), stage II   Dyslipidemia   Hypothyroidism (acquired)   Hyponatremia  Acute hypoxemic respiratory failure due to COVID-19 pneumonia: COVID-19 Labs  Recent Labs    10/12/19 0342 10/13/19 0348 10/14/19 0408  DDIMER 0.82* 0.70* 0.59*  FERRITIN 343* 323* 250  CRP 3.9* 1.8* 0.8   Home test done on 8/29 positive.   Lab Results  Component Value Date   SARSCOV2NAA POSITIVE (A) 10/10/2019   [place] Not Detected 04/24/2019   [place] Not Detected 03/13/2019   [place] NEGATIVE 10/03/2018   SpO2: 96 % O2 Flow Rate (L/min): 1.5 L/min  Continue to monitor due to significant symptoms  chest physiotherapy, incentive spirometry, deep breathing exercises, sputum induction, mucolytic's and bronchodilators. Supplemental oxygen to keep saturations more than 90%. Covid directed therapy with , steroids, on dexamethasone remdesivir, day 5/5 actemra, not indicated convalescent plasma,  not indicated Due to severity of symptoms, patient will need daily inflammatory markers, chest x-rays, liver function test to monitor and direct COVID-19 therapies.  Coronary artery disease: Stable.  Continue aspirin and Plavix.  Hypertension: Stable.  Resumed amlodipine.  Chronic kidney disease stage II: Stable.  Hyponatremia: Gradually improving.  Will encourage oral intake.  Hypothyroidism: On levothyroxine  Hyperlipidemia: On a statin  Chronic atrial fibrillation: Rate controlled.  Currently sinus rhythm on Tikosyn.  On aspirin and Plavix.  Not on therapeutic anticoagulation.  She has a pacemaker.  DVT prophylaxis: enoxaparin (LOVENOX) injection 40 mg Start: 10/11/19 1000   Code Status: Full code Family Communication: daughter on the phone   Disposition Plan: Status is: Inpatient  Remains inpatient appropriate because:Inpatient level of care appropriate due to severity of illness   Dispo: The patient is from: Home              Anticipated d/c is to: Home              Anticipated d/c date is: anticipate discharge home tomorrow if remains stable.               Patient currently is not medically stable to d/c.    Consultants:   None  Procedures:   None  Antimicrobials:  Antibiotics Given (last 72 hours)    Date/Time Action Medication Dose Rate   10/12/19 0958 New Bag/Given   remdesivir 100 mg in sodium chloride 0.9 % 100 mL IVPB 100 mg 200 mL/hr   10/13/19 0957 New Bag/Given   remdesivir 100 mg in sodium chloride 0.9 % 100 mL IVPB 100 mg 200 mL/hr   10/14/19 1104  New Bag/Given   remdesivir 100 mg in sodium chloride 0.9 % 100 mL IVPB 100 mg 200 mL/hr         Subjective: Patient seen and examined. Feels tired otherwise no other complaints. Cough on deep breathing. Remains afebrile. 1-2 liters of oxygen.  Objective: Vitals:   10/13/19 0417 10/13/19 1321 10/13/19 2058 10/14/19 0559  BP: 133/74 135/72 130/68 (!) 147/79  Pulse: 77 75 75 77  Resp: 20 20 18  16   Temp: 98 F (36.7 C) 98.1 F (36.7 C) 98.1 F (36.7 C) 98.5 F (36.9 C)  TempSrc:  Oral    SpO2: 94% 93% 94% 96%  Weight:      Height:        Intake/Output Summary (Last 24 hours) at 10/14/2019 1343 Last data filed at 10/14/2019 0600 Gross per 24 hour  Intake 100 ml  Output --  Net 100 ml   Filed Weights   10/10/19 1841  Weight: 69.9 kg    Examination:  Physical Exam Constitutional:      Comments: Patient is alert oriented x4. She is comfortable looking. On 1 to 2 L of oxygen. Chronically sick looking not in any distress.  HENT:     Head: Atraumatic.     Mouth/Throat:     Mouth: Mucous membranes are moist.  Cardiovascular:     Rate and Rhythm: Regular rhythm.     Comments: Pacemaker present on left precordium. Pulmonary:     Comments: Mostly upper airway sounds. She has hoarse cough. Neurological:     General: No focal deficit present.     Mental Status: She is oriented to person, place, and time.       Data Reviewed: I have personally reviewed following labs and imaging studies  CBC: Recent Labs  Lab 10/10/19 1921 10/11/19 0250 10/12/19 0342 10/13/19 0348 10/14/19 0408  WBC 4.1 5.0 2.6* 3.2* 3.8*  NEUTROABS 2.9 3.7 1.5* 2.1 2.8  HGB 13.4 13.6 13.5 14.1 14.6  HCT 39.0 40.0 40.0 41.5 43.6  MCV 89.4 89.5 89.7 89.2 90.6  PLT 140* 145* 149* 183 098   Basic Metabolic Panel: Recent Labs  Lab 10/10/19 1921 10/11/19 0250 10/12/19 0342 10/13/19 0348 10/14/19 0408  NA 127* 131* 134* 134* 134*  K 3.8 4.6 4.6 4.4 4.4  CL 97* 99 103 101 99  CO2 21* 20* 24 23 24   GLUCOSE 137* 139* 168* 161* 163*  BUN 24* 21 19 18 16   CREATININE 0.98 0.97 0.65 0.59 0.55  CALCIUM 8.0* 8.2* 8.1* 8.0* 8.0*  MG  --  2.0 2.1 2.1 2.0  PHOS  --  3.2 3.5 3.2 2.9   GFR: Estimated Creatinine Clearance: 54.7 mL/min (by C-G formula based on SCr of 0.55 mg/dL). Liver Function Tests: Recent Labs  Lab 10/10/19 1921 10/11/19 0250 10/12/19 0342 10/13/19 0348 10/14/19 0408   AST 26 31 23 25 26   ALT 15 14 15 15 17   ALKPHOS 78 84 81 83 82  BILITOT 0.4 0.6 0.3 0.3 0.5  PROT 6.4* 6.8 6.4* 6.2* 6.1*  ALBUMIN 3.5 3.5 3.3* 3.2* 3.1*   No results for input(s): LIPASE, AMYLASE in the last 168 hours. No results for input(s): AMMONIA in the last 168 hours. Coagulation Profile: No results for input(s): INR, PROTIME in the last 168 hours. Cardiac Enzymes: No results for input(s): CKTOTAL, CKMB, CKMBINDEX, TROPONINI in the last 168 hours. BNP (last 3 results) No results for input(s): PROBNP in the last 8760 hours. HbA1C: No results for input(s):  HGBA1C in the last 72 hours. CBG: No results for input(s): GLUCAP in the last 168 hours. Lipid Profile: No results for input(s): CHOL, HDL, LDLCALC, TRIG, CHOLHDL, LDLDIRECT in the last 72 hours. Thyroid Function Tests: No results for input(s): TSH, T4TOTAL, FREET4, T3FREE, THYROIDAB in the last 72 hours. Anemia Panel: Recent Labs    10/13/19 0348 10/14/19 0408  FERRITIN 323* 250   Sepsis Labs: Recent Labs  Lab 10/10/19 1921 10/10/19 2159  PROCALCITON <0.10  --   LATICACIDVEN 1.0 0.8    Recent Results (from the past 240 hour(s))  SARS Coronavirus 2 by RT PCR (hospital order, performed in [facility] hospital lab) Nasopharyngeal Nasopharyngeal Swab     Status: Abnormal   Collection Time: 10/10/19  7:21 PM   Specimen: Nasopharyngeal Swab  Result Value Ref Range Status   SARS Coronavirus 2 POSITIVE (A) NEGATIVE Final    Comment: RESULT CALLED TO, READ BACK BY AND VERIFIED WITH: HODGES,I. RN @2104  ON 08.31.2021 BY COHEN,K (NOTE) SARS-CoV-2 target nucleic acids are DETECTED  SARS-CoV-2 RNA is generally detectable in upper respiratory specimens  during the acute phase of infection.  Positive results are indicative  of the presence of the identified virus, but do not rule out bacterial infection or co-infection with other pathogens not detected by the test.  Clinical correlation with patient history and  other  diagnostic information is necessary to determine patient infection status.  The expected result is negative.  Fact Sheet for Patients:   StrictlyIdeas.no   Fact Sheet for Healthcare Providers:   BankingDealers.co.za    This test is not yet approved or cleared by the [place] FDA and  has been authorized for detection and/or diagnosis of SARS-CoV-2 by FDA under an Emergency Use Authorization (EUA).  This EUA will remain in effect (meanin g this test can be used) for the duration of  the COVID-19 declaration under Section 564(b)(1) of the Act, 21 U.S.C. section 360-bbb-3(b)(1), unless the authorization is terminated or revoked sooner.  Performed at [facility], [place] [address], [place], [place] [address]   Blood Culture (routine x 2)     Status: None (Preliminary result)   Collection Time: 10/10/19  7:21 PM   Specimen: BLOOD  Result Value Ref Range Status   Specimen Description   Final    BLOOD LEFT ANTECUBITAL Performed at [place] [address], [place], [place] [address]    Special Requests   Final    BOTTLES DRAWN AEROBIC AND ANAEROBIC Blood Culture adequate volume Performed at [place] [address], [place], [place] [address]    Culture   Final    NO GROWTH 3 DAYS Performed at [facility], [place] [address], [place], [place] [address]    Report Status PENDING  Incomplete  Blood Culture (routine x 2)     Status: None (Preliminary result)   Collection Time: 10/10/19  7:40 PM   Specimen: BLOOD  Result Value Ref Range Status   Specimen Description   Final    BLOOD BLOOD LEFT HAND Performed at [place] [address] [place], [place] [address]    Special Requests   Final    BOTTLES DRAWN AEROBIC AND ANAEROBIC Blood Culture adequate volume Performed at [place] [address], [place], [place]  [address]    Culture   Final    NO GROWTH 3 DAYS Performed at [facility], [place] [address],  [place], [place] [address]    Report Status PENDING  Incomplete         Radiology Studies: No results found.      Scheduled Meds: . albuterol  2 puff Inhalation Q6H  . amLODipine  5 mg Oral Daily  . vitamin C  500 mg Oral Daily  . aspirin EC  81 mg Oral Daily  . benzonatate  100 mg Oral Q8H  . citalopram  40 mg Oral Daily  . clopidogrel  75 mg Oral Q breakfast  . dexamethasone (DECADRON) injection  6 mg Intravenous Q24H  . dofetilide  250 mcg Oral BID  . enoxaparin (LOVENOX) injection  40 mg Subcutaneous Q24H  . levothyroxine  25 mcg Oral QAC breakfast  . zinc sulfate  220 mg Oral Daily   Continuous Infusions: . remdesivir 100 mg in NS 100 mL 100 mg (10/14/19 1104)     LOS: 4 days    Time spent: 30 minutes    Barb Merino, MD [facility] Pager [phone]

## 2019-10-14 NOTE — Evaluation (Signed)
Physical Therapy Evaluation Patient Details Name: [name] MRN: [id number] DOB: 06/13/1940 Today's Date: 10/14/2019   History of Present Illness  79 year old female with history of coronary artery disease, chronic kidney disease stage IIIa, coronary artery disease status post CABG in 2017, chronic atrial fibrillation and sick sinus syndrome status post pacemaker, patient is on Tikosyn, depression, hypertension hyperlipidemia diagnosed with COVID-19 infection  Clinical Impression  Pt admitted with above diagnosis.  Pt currently with functional limitations due to the deficits listed below (see PT Problem List). Pt will benefit from skilled PT to increase their independence and safety with mobility to allow discharge to the venue listed below.  Pt with generalized weakness and needing some support from the bed at end of gait.  Will follow acutely, but feel she doesn't need any follow up PT or equipment.     Follow Up Recommendations No PT follow up    Equipment Recommendations  None recommended by PT    Recommendations for Other Services       Precautions / Restrictions Precautions Precautions: None Restrictions Weight Bearing Restrictions: No      Mobility  Bed Mobility Overal bed mobility: Modified Independent                Transfers Overall transfer level: Needs assistance Equipment used: None Transfers: Sit to/from Stand Sit to Stand: Min guard         General transfer comment: min/guard for lines/safety  Ambulation/Gait Ambulation/Gait assistance: Min guard Gait Distance (Feet): 45 Feet (in room) Assistive device: None (bed) Gait Pattern/deviations: Decreased step length - right;Decreased step length - left Gait velocity: decreased   General Gait Details: Amb on 2 L with o2 around 90. Noted pt started to hold onto bed for support the last 10-15 feet.  Stairs            Wheelchair Mobility    Modified Rankin (Stroke Patients Only)        Balance Overall balance assessment: Mild deficits observed, not formally tested                                           Pertinent Vitals/Pain Pain Assessment: No/denies pain    Home Living Family/patient expects to be discharged to:: Private residence Living Arrangements: Alone Available Help at Discharge: Family Type of Home: House Home Access: Level entry     Home Layout: One level Home Equipment: None      Prior Function Level of Independence: Independent               Hand Dominance        Extremity/Trunk Assessment   Upper Extremity Assessment Upper Extremity Assessment: Overall WFL for tasks assessed    Lower Extremity Assessment Lower Extremity Assessment: Overall WFL for tasks assessed    Cervical / Trunk Assessment Cervical / Trunk Assessment: Normal  Communication   Communication: No difficulties  Cognition Arousal/Alertness: Awake/alert Behavior During Therapy: Flat affect Overall Cognitive Status: Within Functional Limits for tasks assessed                                        General Comments      Exercises     Assessment/Plan    PT Assessment Patient needs continued PT services  PT Problem List  Decreased strength;Decreased activity tolerance;Decreased mobility;Cardiopulmonary status limiting activity       PT Treatment Interventions DME instruction;Gait training;Functional mobility training;Therapeutic activities;Therapeutic exercise;Balance training    PT Goals (Current goals can be found in the Care Plan section)  Acute Rehab PT Goals Patient Stated Goal: home PT Goal Formulation: With patient Time For Goal Achievement: 10/28/19 Potential to Achieve Goals: Good    Frequency Min 3X/week   Barriers to discharge Decreased caregiver support lives alone, but states daughter can come be with her.  Daughter also has COVID.    Co-evaluation               AM-PAC PT "6 Clicks"  Mobility  Outcome Measure Help needed turning from your back to your side while in a flat bed without using bedrails?: None Help needed moving from lying on your back to sitting on the side of a flat bed without using bedrails?: None Help needed moving to and from a bed to a chair (including a wheelchair)?: A Little Help needed standing up from a chair using your arms (e.g., wheelchair or bedside chair)?: A Little Help needed to walk in hospital room?: A Little Help needed climbing 3-5 steps with a railing? : A Little 6 Click Score: 20    End of Session   Activity Tolerance: Patient limited by fatigue Patient left: in chair;with call bell/phone within reach Nurse Communication: Mobility status PT Visit Diagnosis: Difficulty in walking, not elsewhere classified (R26.2)    Time: 1173-5670 PT Time Calculation (min) (ACUTE ONLY): 28 min   Charges:   PT Evaluation $PT Eval Low Complexity: 1 Low PT Treatments $Gait Training: 8-22 mins        Jarelis Ehlert L. Tamala Julian, [place] Pager [phone] 10/14/2019   Galen Manila 10/14/2019, 2:06 PM

## 2019-10-15 LAB — COMPREHENSIVE METABOLIC PANEL
ALT: 16 U/L (ref 0–44)
AST: 22 U/L (ref 15–41)
Albumin: 3.2 g/dL — ABNORMAL LOW (ref 3.5–5.0)
Alkaline Phosphatase: 86 U/L (ref 38–126)
Anion gap: 13 (ref 5–15)
BUN: 20 mg/dL (ref 8–23)
CO2: 26 mmol/L (ref 22–32)
Calcium: 8.5 mg/dL — ABNORMAL LOW (ref 8.9–10.3)
Chloride: 96 mmol/L — ABNORMAL LOW (ref 98–111)
Creatinine, Ser: 0.61 mg/dL (ref 0.44–1.00)
GFR calc Af Amer: >60 mL/min (ref >60)
GFR calc non Af Amer: >60 mL/min (ref >60)
Glucose, Bld: 173 mg/dL — ABNORMAL HIGH (ref 70–99)
Potassium: 4.5 mmol/L (ref 3.5–5.1)
Sodium: 135 mmol/L (ref 135–145)
Total Bilirubin: 0.5 mg/dL (ref 0.3–1.2)
Total Protein: 6 g/dL — ABNORMAL LOW (ref 6.5–8.1)

## 2019-10-15 LAB — FERRITIN: Ferritin: 269 ng/mL (ref 11–307)

## 2019-10-15 LAB — CBC WITH DIFFERENTIAL/PLATELET
Abs Immature Granulocytes: 0.02 10*3/uL (ref 0.00–0.07)
Basophils Absolute: 0 10*3/uL (ref 0.0–0.1)
Basophils Relative: 0 %
Eosinophils Absolute: 0 10*3/uL (ref 0.0–0.5)
Eosinophils Relative: 0 %
HCT: 42.7 % (ref 36.0–46.0)
Hemoglobin: 14.5 g/dL (ref 12.0–15.0)
Immature Granulocytes: 1 %
Lymphocytes Relative: 18 %
Lymphs Abs: 0.7 10*3/uL (ref 0.7–4.0)
MCH: 30 pg (ref 26.0–34.0)
MCHC: 34 g/dL (ref 30.0–36.0)
MCV: 88.2 fL (ref 80.0–100.0)
Monocytes Absolute: 0.3 10*3/uL (ref 0.1–1.0)
Monocytes Relative: 8 %
Neutro Abs: 2.9 10*3/uL (ref 1.7–7.7)
Neutrophils Relative %: 73 %
Platelets: 236 10*3/uL (ref 150–400)
RBC: 4.84 MIL/uL (ref 3.87–5.11)
RDW: 12 % (ref 11.5–15.5)
WBC: 4 10*3/uL (ref 4.0–10.5)
nRBC: 0 % (ref 0.0–0.2)

## 2019-10-15 LAB — CULTURE, BLOOD (ROUTINE X 2)
Culture: NO GROWTH
Culture: NO GROWTH
Special Requests: ADEQUATE
Special Requests: ADEQUATE

## 2019-10-15 LAB — D-DIMER, QUANTITATIVE: D-Dimer, Quant: 0.87 ug/mL-FEU — ABNORMAL HIGH (ref 0.00–0.50)

## 2019-10-15 LAB — C-REACTIVE PROTEIN: CRP: 0.7 mg/dL (ref <1.0)

## 2019-10-15 LAB — PHOSPHORUS: Phosphorus: 3 mg/dL (ref 2.5–4.6)

## 2019-10-15 LAB — MAGNESIUM: Magnesium: 2.1 mg/dL (ref 1.7–2.4)

## 2019-10-15 MED ORDER — HYDROCODONE-ACETAMINOPHEN 5-325 MG PO TABS
1.0000 | ORAL_TABLET | Freq: Four times a day (QID) | ORAL | Status: DC | PRN
  Administered 2019-10-15: 1 via ORAL
  Filled 2019-10-15: qty 1

## 2019-10-15 MED ORDER — GUAIFENESIN-DM 100-10 MG/5ML PO SYRP: 10.0000 mL | ORAL_SOLUTION | ORAL | 0 refills | Status: DC | PRN

## 2019-10-15 MED ORDER — DEXAMETHASONE 6 MG PO TABS: 6.0000 mg | ORAL_TABLET | Freq: Every day | ORAL | 0 refills | Status: AC

## 2019-10-15 MED ORDER — HYDROCOD POLST-CPM POLST ER 10-8 MG/5ML PO SUER
5.0000 mL | Freq: Two times a day (BID) | ORAL | 0 refills | Status: DC | PRN
Start: 2019-10-15 — End: 2019-11-01

## 2019-10-15 NOTE — Progress Notes (Signed)
PROGRESS NOTE    [name]  TAV:[id number] DOB: [birth year] DOA: 10/10/2019 PCP: Horald Pollen, MD    Brief Narrative:  79 year old female with history of coronary artery disease, chronic kidney disease stage IIIa, coronary artery disease status post CABG in 2017, chronic atrial fibrillation and sick sinus syndrome status post pacemaker, patient is on Tikosyn, depression, hypertension hyperlipidemia diagnosed with COVID-19 infection about a week ago and managed at home.  Worsening shortness of breath for last 2 days, fatigue and fever came to the ER.  In the emergency room, temperature 102, blood pressure stable.  88% on room air.  Chest x-ray with groundglass airspace disease within the right midlung zone.  CTA multifocal bilateral airspace disease.  Admitted due to hypoxemia.   Assessment & Plan:   Principal Problem:   Acute respiratory failure due to COVID-19 [facility]) Active Problems:   Anxiety   Essential hypertension   CAD (coronary artery disease)   CKD (chronic kidney disease), stage II   Dyslipidemia   Hypothyroidism (acquired)   Hyponatremia  Acute hypoxemic respiratory failure due to COVID-19 pneumonia: COVID-19 Labs  Recent Labs    10/13/19 0348 10/14/19 0408 10/15/19 0522  DDIMER 0.70* 0.59* 0.87*  FERRITIN 323* 250 269  CRP 1.8* 0.8 0.7   Home test done on 8/29 positive.   Lab Results  Component Value Date   SARSCOV2NAA POSITIVE (A) 10/10/2019   [place] Not Detected 04/24/2019   [place] Not Detected 03/13/2019   SARSCOV2NAA NEGATIVE 10/03/2018   SpO2: 95 % O2 Flow Rate (L/min): 2 L/min  Continue to monitor due to significant symptoms  chest physiotherapy, incentive spirometry, deep breathing exercises, sputum induction, mucolytic's and bronchodilators. Supplemental oxygen to keep saturations more than 88 %. Covid directed therapy with , steroids, on dexamethasone, will change to oral on discharge. remdesivir, finished  therapies. actemra, not indicated convalescent plasma, not indicated Due to severity of symptoms, patient will need daily inflammatory markers, chest x-rays, liver function test to monitor and direct COVID-19 therapies.  Coronary artery disease: Stable.  Continue aspirin and Plavix.  Hypertension: Stable.  Resumed amlodipine.  Chronic kidney disease stage II: Stable.  Hyponatremia: Gradually improving.  Will encourage oral intake.  Hypothyroidism: On levothyroxine  Hyperlipidemia: On a statin  Chronic atrial fibrillation: Rate controlled.  Currently sinus rhythm on Tikosyn.  On aspirin and Plavix.  Not on therapeutic anticoagulation.  She has a pacemaker.  Physical deconditioning: Continues to be weak and lethargic with hypoxia.  Continue to mobilize in the hospital.  Work with PT OT.  Work with nursing staff to get around.  DVT prophylaxis: enoxaparin (LOVENOX) injection 40 mg Start: 10/11/19 1000   Code Status: Full code Family Communication: daughter on the phone   Disposition Plan: Status is: Inpatient  Remains inpatient appropriate because:Inpatient level of care appropriate due to severity of illness   Dispo: The patient is from: Home              Anticipated d/c is to: Home              Anticipated d/c date is: anticipate discharge home tomorrow if remains stable.               Patient currently is not medically stable to d/c.    Consultants:   None  Procedures:   None  Antimicrobials:  Antibiotics Given (last 72 hours)    Date/Time Action Medication Dose Rate   10/13/19 0957 New Bag/Given   remdesivir 100 mg in  sodium chloride 0.9 % 100 mL IVPB 100 mg 200 mL/hr   10/14/19 1104 New Bag/Given   remdesivir 100 mg in sodium chloride 0.9 % 100 mL IVPB 100 mg 200 mL/hr   10/15/19 0917 New Bag/Given   remdesivir 100 mg in sodium chloride 0.9 % 100 mL IVPB 100 mg 200 mL/hr         Subjective: Patient seen and examined.  No overnight events.  Feels tired  and fatigued.  Episodic bouts of cough that are unstoppable.  Easy fatigue on walking.  Objective: Vitals:   10/14/19 0559 10/14/19 1553 10/14/19 2228 10/15/19 0618  BP: (!) 147/79 120/62 119/63 (!) 145/86  Pulse: 77 77 75 79  Resp: 16 18 16 16   Temp: 98.5 F (36.9 C) (!) 97 F (36.1 C) 98.3 F (36.8 C) 98.1 F (36.7 C)  TempSrc:      SpO2: 96% 95% 94% 95%  Weight:      Height:       No intake or output data in the 24 hours ending 10/15/19 1322 Filed Weights   10/10/19 1841  Weight: 69.9 kg    Examination:  Physical Exam Constitutional:      Comments: Patient is alert oriented x4.  Comfortable on 1 to 2 L oxygen at rest.  Severe bouts of cough and hypoxia on mobility.  HENT:     Head: Atraumatic.     Mouth/Throat:     Mouth: Mucous membranes are moist.  Cardiovascular:     Rate and Rhythm: Regular rhythm.     Comments: Pacemaker present on left precordium. Pulmonary:     Comments: Mostly upper airway sounds. She has hoarse cough. Neurological:     General: No focal deficit present.     Mental Status: She is oriented to person, place, and time.       Data Reviewed: I have personally reviewed following labs and imaging studies  CBC: Recent Labs  Lab 10/11/19 0250 10/12/19 0342 10/13/19 0348 10/14/19 0408 10/15/19 0522  WBC 5.0 2.6* 3.2* 3.8* 4.0  NEUTROABS 3.7 1.5* 2.1 2.8 2.9  HGB 13.6 13.5 14.1 14.6 14.5  HCT 40.0 40.0 41.5 43.6 42.7  MCV 89.5 89.7 89.2 90.6 88.2  PLT 145* 149* 183 211 629   Basic Metabolic Panel: Recent Labs  Lab 10/11/19 0250 10/12/19 0342 10/13/19 0348 10/14/19 0408 10/15/19 0522  NA 131* 134* 134* 134* 135  K 4.6 4.6 4.4 4.4 4.5  CL 99 103 101 99 96*  CO2 20* 24 23 24 26   GLUCOSE 139* 168* 161* 163* 173*  BUN 21 19 18 16 20   CREATININE 0.97 0.65 0.59 0.55 0.61  CALCIUM 8.2* 8.1* 8.0* 8.0* 8.5*  MG 2.0 2.1 2.1 2.0 2.1  PHOS 3.2 3.5 3.2 2.9 3.0   GFR: Estimated Creatinine Clearance: 54.7 mL/min (by C-G formula based  on SCr of 0.61 mg/dL). Liver Function Tests: Recent Labs  Lab 10/11/19 0250 10/12/19 0342 10/13/19 0348 10/14/19 0408 10/15/19 0522  AST 31 23 25 26 22   ALT 14 15 15 17 16   ALKPHOS 84 81 83 82 86  BILITOT 0.6 0.3 0.3 0.5 0.5  PROT 6.8 6.4* 6.2* 6.1* 6.0*  ALBUMIN 3.5 3.3* 3.2* 3.1* 3.2*   No results for input(s): LIPASE, AMYLASE in the last 168 hours. No results for input(s): AMMONIA in the last 168 hours. Coagulation Profile: No results for input(s): INR, PROTIME in the last 168 hours. Cardiac Enzymes: No results for input(s): CKTOTAL, CKMB, CKMBINDEX, TROPONINI in  the last 168 hours. BNP (last 3 results) No results for input(s): PROBNP in the last 8760 hours. HbA1C: No results for input(s): HGBA1C in the last 72 hours. CBG: No results for input(s): GLUCAP in the last 168 hours. Lipid Profile: No results for input(s): CHOL, HDL, LDLCALC, TRIG, CHOLHDL, LDLDIRECT in the last 72 hours. Thyroid Function Tests: No results for input(s): TSH, T4TOTAL, FREET4, T3FREE, THYROIDAB in the last 72 hours. Anemia Panel: Recent Labs    10/14/19 0408 10/15/19 0522  FERRITIN 250 269   Sepsis Labs: Recent Labs  Lab 10/10/19 1921 10/10/19 2159  PROCALCITON <0.10  --   LATICACIDVEN 1.0 0.8    Recent Results (from the past 240 hour(s))  SARS Coronavirus 2 by RT PCR (hospital order, performed in [facility] hospital lab) Nasopharyngeal Nasopharyngeal Swab     Status: Abnormal   Collection Time: 10/10/19  7:21 PM   Specimen: Nasopharyngeal Swab  Result Value Ref Range Status   SARS Coronavirus 2 POSITIVE (A) NEGATIVE Final    Comment: RESULT CALLED TO, READ BACK BY AND VERIFIED WITH: HODGES,I. RN @2104  ON 08.31.2021 BY COHEN,K (NOTE) SARS-CoV-2 target nucleic acids are DETECTED  SARS-CoV-2 RNA is generally detectable in upper respiratory specimens  during the acute phase of infection.  Positive results are indicative  of the presence of the identified virus, but do not rule  out bacterial infection or co-infection with other pathogens not detected by the test.  Clinical correlation with patient history and  other diagnostic information is necessary to determine patient infection status.  The expected result is negative.  Fact Sheet for Patients:   StrictlyIdeas.no   Fact Sheet for Healthcare Providers:   BankingDealers.co.za    This test is not yet approved or cleared by the [place] FDA and  has been authorized for detection and/or diagnosis of SARS-CoV-2 by FDA under an Emergency Use Authorization (EUA).  This EUA will remain in effect (meanin g this test can be used) for the duration of  the COVID-19 declaration under Section 564(b)(1) of the Act, 21 U.S.C. section 360-bbb-3(b)(1), unless the authorization is terminated or revoked sooner.  Performed at [facility], [place] [address], [place], [place] [address]   Blood Culture (routine x 2)     Status: None   Collection Time: 10/10/19  7:21 PM   Specimen: BLOOD  Result Value Ref Range Status   Specimen Description   Final    BLOOD LEFT ANTECUBITAL Performed at [facility] [address], [place], [place] [address]    Special Requests   Final    BOTTLES DRAWN AEROBIC AND ANAEROBIC Blood Culture adequate volume Performed at [facility] [address] [place], [place] [address]    Culture   Final    NO GROWTH 5 DAYS Performed at [facility], [place] [address] [place], [place] [address]    Report Status 10/15/2019 FINAL  Final  Blood Culture (routine x 2)     Status: None   Collection Time: 10/10/19  7:40 PM   Specimen: BLOOD  Result Value Ref Range Status   Specimen Description   Final    BLOOD BLOOD LEFT HAND Performed at [facility] [address], [place], [place] [address]    Special Requests   Final    BOTTLES DRAWN AEROBIC AND ANAEROBIC Blood  Culture adequate volume Performed at [facility] [address], [place],  [address]    Culture  Final    NO GROWTH 5 DAYS Performed at [facility], [place] [address], [place], [place] [address]    Report Status 10/15/2019 FINAL  Final         Radiology Studies: No results found.      Scheduled Meds: . albuterol  2 puff Inhalation Q6H  . amLODipine  5 mg Oral Daily  . vitamin C  500 mg Oral Daily  . aspirin EC  81 mg Oral Daily  . benzonatate  100 mg Oral Q8H  . citalopram  40 mg Oral Daily  . clopidogrel  75 mg Oral Q breakfast  . dexamethasone (DECADRON) injection  6 mg Intravenous Q24H  . dofetilide  250 mcg Oral BID  . enoxaparin (LOVENOX) injection  40 mg Subcutaneous Q24H  . levothyroxine  25 mcg Oral QAC breakfast  . zinc sulfate  220 mg Oral Daily   Continuous Infusions:    LOS: 5 days    Time spent: 30 minutes    Barb Merino, MD [facility] Pager [phone]

## 2019-10-15 NOTE — Discharge Summary (Signed)
Physician Discharge Summary  [name] TGY:[id number] DOB: 01-20-1941 DOA: 10/10/2019  PCP: Horald Pollen, MD  Admit date: 10/10/2019 Discharge date: 10/16/2019  Admitted From: Home Disposition: Home  Recommendations for Outpatient Follow-up:  1. Follow up with PCP in 1-2 weeks   Home Health: Not applicable Equipment/Devices: Oxygen  Discharge Condition: Stable CODE STATUS: Full code Diet recommendation: Low-salt diet  Discharge summary: 79 year old female with history of coronary artery disease, chronic kidney disease stage IIIa, status post CABG, chronic A. fib and sick sinus syndrome status post pacemaker on Tikosyn, depression, hypertension and hyperlipidemia, unvaccinated who presented to the ER with about a week of fatigue and fever with shortness of breath.  Diagnosed with COVID-19 about a week ago before coming to the hospital.  Admitted to hospital on 8/31.  Chest x-ray with groundglass airspace disease.  CTA with multifocal bilateral pneumonia, negative for PE.  Patient was also having unexplained cough for last 2 years for which she is going to see Dr. Halford Chessman with pulmonary in 3 weeks.  Patient was admitted to the hospital with hypoxia related to COVID-19 pneumonia.  She was treated with aggressive bronchodilator therapy, chest physiotherapy and other supportive treatment as well as remdesivir that she finished therapy.  She was treated with dexamethasone, will change to oral dexamethasone to complete 5 more days of therapy. Patient has finished COVID-19 directed therapies, however she still has some cough and hypoxia on mobility.  With medical stabilization she will be going home, will order supplemental oxygen until clinical recovery.  Patient was highly encouraged to take COVID-19 vaccinations after she recovers from current episode.  Patient is agreeable. She also has chronic cough that is worsening her current recovery.  Patient is already on bronchodilator therapy  and long-acting steroids inhaler at home that she will continue.  She is scheduled to see a pulmonary provider in 3 weeks that she will keep up the appointment.  Other multiple medical issues including coronary artery disease, hypertension, chronic kidney disease, hypothyroidism they remained stable and she will resume all her medications.  Patient has been taking Tikosyn uninterrupted and she will continue to take.  She has a pacemaker.  She is not on anticoagulation but she is on aspirin and Plavix.  Patient medically stabilizing.  Able to go home today with adequate support at home.  Oxygen to be arranged.  Discharge Diagnoses:  Principal Problem:   Acute respiratory failure due to COVID-19 [facility]) Active Problems:   Anxiety   Essential hypertension   CAD (coronary artery disease)   CKD (chronic kidney disease), stage II   Dyslipidemia   Hypothyroidism (acquired)   Hyponatremia    Discharge Instructions  Discharge Instructions    Call MD for:  difficulty breathing, headache or visual disturbances   Complete by: As directed    Call MD for:  extreme fatigue   Complete by: As directed    Call MD for:  persistant dizziness or light-headedness   Complete by: As directed    Call MD for:  temperature >100.4   Complete by: As directed    Diet - low sodium heart healthy   Complete by: As directed    Discharge instructions   Complete by: As directed    Isolation precautions for total 3 weeks since your positive test. You are prescribed cough medications, take as per instructed. Schedule follow-up with your pulmonologist for your chronic cough. Would suggest you take COVID-19 vaccine once you improved from current symptoms of shortness of breath  and cough.   Increase activity slowly   Complete by: As directed      Allergies as of 10/16/2019      Reactions   Brilinta [ticagrelor] Shortness Of Breath   Clonidine Derivatives Other (See Comments)   Lowers heart rate   Atorvastatin  Other (See Comments)   Possible cause of fatigue/malaise   Crestor [rosuvastatin Calcium] Other (See Comments)   Joint/muscle aches   Exforge [amlodipine Besylate-valsartan] Itching, Rash   Spironolactone Other (See Comments)   Contraindicated with history of hyperkalemia      Medication List    STOP taking these medications   doxycycline 100 MG tablet Commonly known as: VIBRA-TABS     TAKE these medications   albuterol 108 (90 Base) MCG/ACT inhaler Commonly known as: VENTOLIN HFA Inhale 1-2 puffs into the lungs every 6 (six) hours as needed for wheezing or shortness of breath.   amLODipine 5 MG tablet Commonly known as: NORVASC TAKE 1.5 TABLETS (7.5 MG TOTAL) BY MOUTH DAILY. What changed: See the new instructions.   aspirin EC 81 MG tablet Take 81 mg by mouth daily.   benzonatate 100 MG capsule Commonly known as: TESSALON Take 1 capsule (100 mg total) by mouth every 8 (eight) hours.   budesonide-formoterol 80-4.5 MCG/ACT inhaler Commonly known as: SYMBICORT Inhale 2 puffs into the lungs 2 (two) times daily.   chlorpheniramine-HYDROcodone 10-8 MG/5ML Suer Commonly known as: TUSSIONEX Take 5 mLs by mouth every 12 (twelve) hours as needed for cough.   citalopram 40 MG tablet Commonly known as: CELEXA TAKE 1 TABLET BY MOUTH EVERY DAY   clopidogrel 75 MG tablet Commonly known as: PLAVIX TAKE 1 TABLET (75 MG TOTAL) BY MOUTH DAILY WITH BREAKFAST.   dexamethasone 6 MG tablet Commonly known as: DECADRON Take 1 tablet (6 mg total) by mouth daily for 5 days.   dofetilide 250 MCG capsule Commonly known as: TIKOSYN Take 1 capsule (250 mcg total) by mouth 2 (two) times daily.   furosemide 40 MG tablet Commonly known as: LASIX Take 40 mg by mouth daily as needed for fluid or edema.   guaiFENesin-dextromethorphan 100-10 MG/5ML syrup Commonly known as: ROBITUSSIN DM Take 10 mLs by mouth every 4 (four) hours as needed for cough.   levothyroxine 25 MCG tablet Commonly  known as: SYNTHROID Take 25 mcg by mouth daily before breakfast.   Multi-Vite Liqd Take 1 tablet by mouth daily.   NIACIN PO Take 1 tablet by mouth daily.   nitroGLYCERIN 0.4 MG SL tablet Commonly known as: NITROSTAT Place 1 tablet (0.4 mg total) under the tongue every 5 (five) minutes x 3 doses as needed for chest pain.   OMEGA 3 PO Take 690 mg by mouth daily.   ondansetron 4 MG tablet Commonly known as: ZOFRAN Take 4 mg by mouth every 8 (eight) hours as needed for nausea or vomiting.   TURMERIC PO Take 475 mg by mouth daily.   vitamin C 500 MG tablet Commonly known as: ASCORBIC ACID Take 500 mg by mouth daily.   VITAMIN D3 PO Take 1 tablet by mouth daily.            Durable Medical Equipment  (From admission, onward)         Start     Ordered   10/15/19 1310  For home use only DME oxygen  Once       Comments: Patient Saturations on Room Air at Rest = 89%  Patient Saturations on [employer] while Ambulating =  86%  Patient Saturations on 3 Liters of oxygen while Ambulating = 91%  Please briefly explain why patient needs home oxygen: patient needs supplemental oxygen to maintain O2 saturation at an acceptable level.  Question Answer Comment  Length of Need 6 Months   Mode or (Route) Nasal cannula   Liters per Minute 3   Frequency Continuous (stationary and portable oxygen unit needed)   Oxygen conserving device Yes   Oxygen delivery system Gas      10/15/19 1309          Follow-up Information    Horald Pollen, MD Follow up in 2 week(s).   Specialty: Internal Medicine Contact information: [place] [address] [phone]        Sanda Klein, MD .   Specialty: Cardiology Contact information: [address] [place] [address] [phone]              Allergies  Allergen Reactions  . Brilinta [Ticagrelor] Shortness Of Breath  . Clonidine Derivatives Other (See Comments)    Lowers heart rate   . Atorvastatin Other (See Comments)    Possible cause of fatigue/malaise  . Crestor [Rosuvastatin Calcium] Other (See Comments)    Joint/muscle aches  . Exforge [Amlodipine Besylate-Valsartan] Itching and Rash  . Spironolactone Other (See Comments)    Contraindicated with history of hyperkalemia    Consultations:  None   Procedures/Studies: CT Angio Chest PE W/Cm &/Or Wo Cm  Result Date: 10/10/2019 CLINICAL DATA:  COVID-19 positive 6 days ago, cough, fatigue, decreased p.o. intake EXAM: CT ANGIOGRAPHY CHEST WITH CONTRAST TECHNIQUE: Multidetector CT imaging of the chest was performed using the standard protocol during bolus administration of intravenous contrast. Multiplanar CT image reconstructions and MIPs were obtained to evaluate the vascular anatomy. CONTRAST:  74mL OMNIPAQUE IOHEXOL 350 MG/ML SOLN COMPARISON:  08/26/2015, 10/10/2019 FINDINGS: Cardiovascular: This is a technically adequate evaluation of the pulmonary vasculature. There are no filling defects or pulmonary emboli. The heart is unremarkable without pericardial effusion. Postsurgical changes are seen from previous bypass surgery. Dual lead pacer again noted. Dilatation of the ascending thoracic aorta measuring up to 3.9 cm, stable since prior exam. Mild diffuse atherosclerosis. Mediastinum/Nodes: No enlarged mediastinal, hilar, or axillary lymph nodes. Thyroid gland, trachea, and esophagus demonstrate no significant findings. Lungs/Pleura: There is multifocal bilateral ground-glass airspace disease, most pronounced within the right upper lobe corresponding to chest x-ray findings. Overall the appearance is consistent with COVID-19 pneumonia. No effusion or pneumothorax. Central airways are patent. Upper Abdomen: Stable right adrenal nodule likely an adenoma. No acute upper abdominal process. Musculoskeletal: No acute or destructive bony lesions. Reconstructed images demonstrate no additional findings. Review of the MIP images  confirms the above findings. IMPRESSION: 1. No evidence of pulmonary embolus. 2. Multifocal bilateral airspace disease consistent with COVID 19 pneumonia. 3.  Aortic Atherosclerosis (ICD10-I70.0). Electronically Signed   By: Randa Ngo M.D.   On: 10/10/2019 21:31   DG Chest Port 1 View  Result Date: 10/10/2019 CLINICAL DATA:  COVID-19 positive, increasing shortness of breath EXAM: PORTABLE CHEST 1 VIEW COMPARISON:  09/05/2019 FINDINGS: Single frontal view of the chest demonstrates interval development of ground-glass airspace disease within the right midlung zone. Background interstitial prominence is unchanged. No effusion or pneumothorax. The cardiac silhouette is stable, with postsurgical changes from prior CABG. Dual lead pacer is unchanged. No acute bony abnormalities. IMPRESSION: 1. Interval development of ground-glass airspace disease within the right midlung zone, which could reflect early pneumonia. Electronically Signed  By: Randa Ngo M.D.   On: 10/10/2019 19:59   (Echo, Carotid, EGD, Colonoscopy, ERCP)    Subjective: Patient was seen and examined.  Still has some cough otherwise denies any complaints.  She feels comfortable to go home.  Has adequate support at home.  She will go home with oxygen.  She will follow up with the pulmonary provider.   Discharge Exam: Vitals:   10/15/19 2100 10/16/19 0558  BP: (!) 150/76 139/77  Pulse: 77 76  Resp: 18 18  Temp: 97.7 F (36.5 C) 98.2 F (36.8 C)  SpO2: 94% 94%   Vitals:   10/15/19 0618 10/15/19 1402 10/15/19 2100 10/16/19 0558  BP: (!) 145/86 136/72 (!) 150/76 139/77  Pulse: 79 76 77 76  Resp: 16 20 18 18   Temp: 98.1 F (36.7 C) 98.2 F (36.8 C) 97.7 F (36.5 C) 98.2 F (36.8 C)  TempSrc:  Oral Oral Oral  SpO2: 95% 93% 94% 94%  Weight:      Height:        General: Pt is alert, awake, not in acute distress Chronically sick looking.  On 1 L oxygen at rest.  Not in any distress. Cardiovascular: RRR, S1/S2 +, no  rubs, no gallops, pacemaker on left precordium. Respiratory: CTA bilaterally, no wheezing, no rhonchi, mostly upper airway conducted sounds. Abdominal: Soft, NT, ND, bowel sounds + Extremities: no edema, no cyanosis    The results of significant diagnostics from this hospitalization (including imaging, microbiology, ancillary and laboratory) are listed below for reference.     Microbiology: Recent Results (from the past 240 hour(s))  SARS Coronavirus 2 by RT PCR (hospital order, performed in [facility] hospital lab) Nasopharyngeal Nasopharyngeal Swab     Status: Abnormal   Collection Time: 10/10/19  7:21 PM   Specimen: Nasopharyngeal Swab  Result Value Ref Range Status   SARS Coronavirus 2 POSITIVE (A) NEGATIVE Final    Comment: RESULT CALLED TO, READ BACK BY AND VERIFIED WITH: HODGES,I. RN @2104  ON 08.31.2021 BY COHEN,K (NOTE) SARS-CoV-2 target nucleic acids are DETECTED  SARS-CoV-2 RNA is generally detectable in upper respiratory specimens  during the acute phase of infection.  Positive results are indicative  of the presence of the identified virus, but do not rule out bacterial infection or co-infection with other pathogens not detected by the test.  Clinical correlation with patient history and  other diagnostic information is necessary to determine patient infection status.  The expected result is negative.  Fact Sheet for Patients:   StrictlyIdeas.no   Fact Sheet for Healthcare Providers:   BankingDealers.co.za    This test is not yet approved or cleared by the [place] FDA and  has been authorized for detection and/or diagnosis of SARS-CoV-2 by FDA under an Emergency Use Authorization (EUA).  This EUA will remain in effect (meanin g this test can be used) for the duration of  the COVID-19 declaration under Section 564(b)(1) of the Act, 21 U.S.C. section 360-bbb-3(b)(1), unless the authorization is terminated or  revoked sooner.  Performed at [facility], [place] [address], [place], [place] [address]   Blood Culture (routine x 2)     Status: None   Collection Time: 10/10/19  7:21 PM   Specimen: BLOOD  Result Value Ref Range Status   Specimen Description   Final    BLOOD LEFT ANTECUBITAL Performed at [facility] [address] [place], [place] [address]    Special Requests   Final  BOTTLES DRAWN AEROBIC AND ANAEROBIC Blood Culture adequate volume Performed at [facility] [address], [place], [place] [address]    Culture   Final    NO GROWTH 5 DAYS Performed at [facility], [place] [address], [place], [place] [address]    Report Status 10/15/2019 FINAL  Final  Blood Culture (routine x 2)     Status: None   Collection Time: 10/10/19  7:40 PM   Specimen: BLOOD  Result Value Ref Range Status   Specimen Description   Final    BLOOD BLOOD LEFT HAND Performed at [facility] [address], [place], [place] [address]    Special Requests   Final    BOTTLES DRAWN AEROBIC AND ANAEROBIC Blood Culture adequate volume Performed at [facility] [address] [place], [place] [address]    Culture   Final    NO GROWTH 5 DAYS Performed at [facility], [place] [address] [place], [place] [address]    Report Status 10/15/2019 FINAL  Final     Labs: BNP (last 3 results) No results for input(s): BNP in the last 8760 hours. Basic Metabolic Panel: Recent Labs  Lab 10/11/19 0250 10/12/19 0342 10/13/19 0348 10/14/19 0408 10/15/19 0522  NA 131* 134* 134* 134* 135  K 4.6 4.6 4.4 4.4 4.5  CL 99 103 101 99 96*  CO2 20* 24 23 24 26   GLUCOSE 139* 168* 161* 163* 173*  BUN 21 19 18 16 20   CREATININE 0.97 0.65 0.59 0.55 0.61  CALCIUM 8.2* 8.1* 8.0* 8.0* 8.5*  MG 2.0 2.1 2.1 2.0 2.1  PHOS 3.2 3.5 3.2 2.9 3.0   Liver Function Tests: Recent Labs  Lab 10/11/19 0250 10/12/19 0342  10/13/19 0348 10/14/19 0408 10/15/19 0522  AST 31 23 25 26 22   ALT 14 15 15 17 16   ALKPHOS 84 81 83 82 86  BILITOT 0.6 0.3 0.3 0.5 0.5  PROT 6.8 6.4* 6.2* 6.1* 6.0*  ALBUMIN 3.5 3.3* 3.2* 3.1* 3.2*   No results for input(s): LIPASE, AMYLASE in the last 168 hours. No results for input(s): AMMONIA in the last 168 hours. CBC: Recent Labs  Lab 10/11/19 0250 10/12/19 0342 10/13/19 0348 10/14/19 0408 10/15/19 0522  WBC 5.0 2.6* 3.2* 3.8* 4.0  NEUTROABS 3.7 1.5* 2.1 2.8 2.9  HGB 13.6 13.5 14.1 14.6 14.5  HCT 40.0 40.0 41.5 43.6 42.7  MCV 89.5 89.7 89.2 90.6 88.2  PLT 145* 149* 183 211 236   Cardiac Enzymes: No results for input(s): CKTOTAL, CKMB, CKMBINDEX, TROPONINI in the last 168 hours. BNP: Invalid input(s): POCBNP CBG: No results for input(s): GLUCAP in the last 168 hours. D-Dimer Recent Labs    10/14/19 0408 10/15/19 0522  DDIMER 0.59* 0.87*   Hgb A1c No results for input(s): HGBA1C in the last 72 hours. Lipid Profile No results for input(s): CHOL, HDL, LDLCALC, TRIG, CHOLHDL, LDLDIRECT in the last 72 hours. Thyroid function studies No results for input(s): TSH, T4TOTAL, T3FREE, THYROIDAB in the last 72 hours.  Invalid input(s): FREET3 Anemia work up Recent Labs    10/14/19 0408 10/15/19 0522  FERRITIN 250 269   Urinalysis    Component Value Date/Time   COLORURINE AMBER (A) 02/07/2018 0946   APPEARANCEUR CLEAR 02/07/2018 0946   LABSPEC 1.040 (H) 02/07/2018 0946   PHURINE 5.0 02/07/2018 [place] 02/07/2018 [place] 12/31/2009 [place] 02/07/2018 0946  BILIRUBINUR NEGATIVE 02/07/2018 0946   BILIRUBINUR small (A) 02/04/2017 1454   KETONESUR 20 (A) 02/07/2018 0946   PROTEINUR 30 (A) 02/07/2018 0946   UROBILINOGEN 0.2 02/04/2017 1454   UROBILINOGEN 1.0 06/03/2012 1610   NITRITE NEGATIVE 02/07/2018 0946   LEUKOCYTESUR NEGATIVE 02/07/2018 0946   Sepsis Labs Invalid input(s): PROCALCITONIN,  WBC,   LACTICIDVEN Microbiology Recent Results (from the past 240 hour(s))  SARS Coronavirus 2 by RT PCR (hospital order, performed in [place] [facility] hospital lab) Nasopharyngeal Nasopharyngeal Swab     Status: Abnormal   Collection Time: 10/10/19  7:21 PM   Specimen: Nasopharyngeal Swab  Result Value Ref Range Status   SARS Coronavirus 2 POSITIVE (A) NEGATIVE Final    Comment: RESULT CALLED TO, READ BACK BY AND VERIFIED WITH: HODGES,I. RN @2104  ON 08.31.2021 BY COHEN,K (NOTE) SARS-CoV-2 target nucleic acids are DETECTED  SARS-CoV-2 RNA is generally detectable in upper respiratory specimens  during the acute phase of infection.  Positive results are indicative  of the presence of the identified virus, but do not rule out bacterial infection or co-infection with other pathogens not detected by the test.  Clinical correlation with patient history and  other diagnostic information is necessary to determine patient infection status.  The expected result is negative.  Fact Sheet for Patients:   StrictlyIdeas.no   Fact Sheet for Healthcare Providers:   BankingDealers.co.za    This test is not yet approved or cleared by the [place] FDA and  has been authorized for detection and/or diagnosis of SARS-CoV-2 by FDA under an Emergency Use Authorization (EUA).  This EUA will remain in effect (meanin g this test can be used) for the duration of  the COVID-19 declaration under Section 564(b)(1) of the Act, 21 U.S.C. section 360-bbb-3(b)(1), unless the authorization is terminated or revoked sooner.  Performed at [facility], [place] [address], [place], [place] [address]   Blood Culture (routine x 2)     Status: None   Collection Time: 10/10/19  7:21 PM   Specimen: BLOOD  Result Value Ref Range Status   Specimen Description   Final    BLOOD LEFT ANTECUBITAL Performed at [facility] [address]  [place], [place] [address]    Special Requests   Final    BOTTLES DRAWN AEROBIC AND ANAEROBIC Blood Culture adequate volume Performed at [facility] [address] [place], [place] [address]    Culture   Final    NO GROWTH 5 DAYS Performed at [facility], [place] [address], [place], [place] [address]    Report Status 10/15/2019 FINAL  Final  Blood Culture (routine x 2)     Status: None   Collection Time: 10/10/19  7:40 PM   Specimen: BLOOD  Result Value Ref Range Status   Specimen Description   Final    BLOOD BLOOD LEFT HAND Performed at [facility] [address], [place], [place] [address]    Special Requests   Final    BOTTLES DRAWN AEROBIC AND ANAEROBIC Blood Culture adequate volume Performed at [facility] [address], [place], [place] [address]    Culture   Final    NO GROWTH 5 DAYS Performed at [facility], [place] [address], [place],  [address]    Report Status 10/15/2019 FINAL  Final     Time coordinating discharge: 40 minutes  SIGNED:   Barb Merino, MD  [facility] 10/16/2019, 11:55  AM

## 2019-10-15 NOTE — Progress Notes (Signed)
SATURATION QUALIFICATIONS: (This note is used to comply with regulatory documentation for home oxygen)  Patient Saturations on Room Air at Rest = 89%  Patient Saturations on Room Air while Ambulating = 86%  Patient Saturations on 3 Liters of oxygen while Ambulating = 91%  Please briefly explain why patient needs home oxygen: patient needs supplemental oxygen to maintain O2 saturation at an acceptable level.

## 2019-10-16 NOTE — Progress Notes (Addendum)
Physical Therapy Treatment Patient Details Name: [name] MRN: [id number] DOB: August 21, 1940 Today's Date: 10/16/2019    History of Present Illness 79 year old female with history of coronary artery disease, chronic kidney disease stage IIIa, coronary artery disease status post CABG in 2017, chronic atrial fibrillation and sick sinus syndrome status post pacemaker, patient is on Tikosyn, depression, hypertension hyperlipidemia diagnosed with COVID-19 infection    PT Comments    Patient close to baseline and completing bed mobility and transfers at Mod independent level. She was educated on functional LE strengthening and encouraged to participate in exercise and walking at home to improve endurance. Educated on breathing exercise to reduce SOB with activity. Patient now ambulating at supervision level and anticipate she will progress well as she mobilizes more frequently. Acute will follow; anticipate no further needs after discharge.     Follow Up Recommendations  No PT follow up     Equipment Recommendations  None recommended by PT    Recommendations for Other Services       Precautions / Restrictions Precautions Precautions: None Restrictions Weight Bearing Restrictions: No    Mobility  Bed Mobility Overal bed mobility: Modified Independent                Transfers Overall transfer level: Modified independent Equipment used: None Transfers: Sit to/from Stand Sit to Stand: Supervision;Modified independent (Device/Increase time)         General transfer comment: Mod Indepedent for power up from EOB and commode. Assist for lines.   Ambulation/Gait Ambulation/Gait assistance: Supervision Gait Distance (Feet): 25 Feet (in room) Assistive device: None Gait Pattern/deviations: Step-through pattern;Decreased stride length Gait velocity: decr   General Gait Details: pt saturating at 92-3% on RA with gait to bathroom. supervision for safety. pt sats improved to  97% on 2L/min.    Stairs             Wheelchair Mobility    Modified Rankin (Stroke Patients Only)       Balance Overall balance assessment: Mild deficits observed, not formally tested Sitting-balance support: Feet supported               Cognition Arousal/Alertness: Awake/alert Behavior During Therapy: WFL for tasks assessed/performed Overall Cognitive Status: Within Functional Limits for tasks assessed               Exercises Other Exercises Other Exercises: 10x Sit< >Stand, no UE use for LE strengthening.  Other Exercises: Pursed lip breathing (5 reps) in for 2 seconds, out for 3+seconds.     General Comments        Pertinent Vitals/Pain Pain Assessment: No/denies pain           PT Goals (current goals can now be found in the care plan section) Acute Rehab PT Goals Patient Stated Goal: home PT Goal Formulation: With patient Time For Goal Achievement: 10/28/19 Potential to Achieve Goals: Good Progress towards PT goals: Progressing toward goals    Frequency    Min 3X/week      PT Plan Current plan remains appropriate       AM-PAC PT "6 Clicks" Mobility   Outcome Measure  Help needed turning from your back to your side while in a flat bed without using bedrails?: None Help needed moving from lying on your back to sitting on the side of a flat bed without using bedrails?: None Help needed moving to and from a bed to a chair (including a wheelchair)?: None Help needed standing up from a  chair using your arms (e.g., wheelchair or bedside chair)?: None Help needed to walk in hospital room?: A Little Help needed climbing 3-5 steps with a railing? : A Little 6 Click Score: 22    End of Session Equipment Utilized During Treatment: Gait belt;Oxygen Activity Tolerance: Patient tolerated treatment well Patient left: in chair;with call bell/phone within reach Nurse Communication: Mobility status PT Visit Diagnosis: Difficulty in walking,  not elsewhere classified (R26.2)     Time: 6295-2841 PT Time Calculation (min) (ACUTE ONLY): 27 min  Charges:  $Gait Training: 8-22 mins $Therapeutic Exercise: 8-22 mins                     Verner Mould, DPT Acute Rehabilitation Services  Office [phone] Pager [phone]  10/16/2019 3:48 PM

## 2019-10-16 NOTE — Discharge Summary (Signed)
Physician Discharge Summary  [name] CHE:[id number] DOB: Apr 25, 1940 DOA: 10/10/2019  PCP: Horald Pollen, MD  Admit date: 10/10/2019 Discharge date: 10/16/2019  Admitted From: Home Disposition: Home  Recommendations for Outpatient Follow-up:  1. Follow up with PCP in 1-2 weeks   Home Health: Not applicable Equipment/Devices: Oxygen  Discharge Condition: Stable CODE STATUS: Full code Diet recommendation: Low-salt diet  Discharge summary: 79 year old female with history of coronary artery disease, chronic kidney disease stage IIIa, status post CABG, chronic A. fib and sick sinus syndrome status post pacemaker on Tikosyn, depression, hypertension and hyperlipidemia, unvaccinated who presented to the ER with about a week of fatigue and fever with shortness of breath.  Diagnosed with COVID-19 about a week ago before coming to the hospital.  Admitted to hospital on 8/31.  Chest x-ray with groundglass airspace disease.  CTA with multifocal bilateral pneumonia, negative for PE.  Patient was also having unexplained cough for last 2 years for which she is going to see Dr. Halford Chessman with pulmonary in 3 weeks.  Patient was admitted to the hospital with hypoxia related to COVID-19 pneumonia.  She was treated with aggressive bronchodilator therapy, chest physiotherapy and other supportive treatment as well as remdesivir that she finished therapy.  She was treated with dexamethasone, will change to oral dexamethasone to complete 5 more days of therapy. Patient has finished COVID-19 directed therapies, however she still has some cough and hypoxia on mobility.  With medical stabilization she will be going home, will order supplemental oxygen until clinical recovery.  Patient was highly encouraged to take COVID-19 vaccinations after she recovers from current episode.  Patient is agreeable. She also has chronic cough that is worsening her current recovery.  Patient is already on bronchodilator therapy  and long-acting steroids inhaler at home that she will continue.  She is scheduled to see a pulmonary provider in 3 weeks that she will keep up the appointment.  Other multiple medical issues including coronary artery disease, hypertension, chronic kidney disease, hypothyroidism they remained stable and she will resume all her medications.  Patient has been taking Tikosyn uninterrupted and she will continue to take.  She has a pacemaker.  She is not on anticoagulation but she is on aspirin and Plavix.  Patient medically stabilizing.  Able to go home today with adequate support at home.  Oxygen to be arranged.  Discharge Diagnoses:  Principal Problem:   Acute respiratory failure due to COVID-19 [facility]) Active Problems:   Anxiety   Essential hypertension   CAD (coronary artery disease)   CKD (chronic kidney disease), stage II   Dyslipidemia   Hypothyroidism (acquired)   Hyponatremia    Discharge Instructions  Discharge Instructions    Call MD for:  difficulty breathing, headache or visual disturbances   Complete by: As directed    Call MD for:  extreme fatigue   Complete by: As directed    Call MD for:  persistant dizziness or light-headedness   Complete by: As directed    Call MD for:  temperature >100.4   Complete by: As directed    Diet - low sodium heart healthy   Complete by: As directed    Discharge instructions   Complete by: As directed    Isolation precautions for total 3 weeks since your positive test. You are prescribed cough medications, take as per instructed. Schedule follow-up with your pulmonologist for your chronic cough. Would suggest you take COVID-19 vaccine once you improved from current symptoms of shortness of breath  and cough.   Increase activity slowly   Complete by: As directed      Allergies as of 10/16/2019      Reactions   Brilinta [ticagrelor] Shortness Of Breath   Clonidine Derivatives Other (See Comments)   Lowers heart rate   Atorvastatin  Other (See Comments)   Possible cause of fatigue/malaise   Crestor [rosuvastatin Calcium] Other (See Comments)   Joint/muscle aches   Exforge [amlodipine Besylate-valsartan] Itching, Rash   Spironolactone Other (See Comments)   Contraindicated with history of hyperkalemia      Medication List    STOP taking these medications   doxycycline 100 MG tablet Commonly known as: VIBRA-TABS     TAKE these medications   albuterol 108 (90 Base) MCG/ACT inhaler Commonly known as: VENTOLIN HFA Inhale 1-2 puffs into the lungs every 6 (six) hours as needed for wheezing or shortness of breath.   amLODipine 5 MG tablet Commonly known as: NORVASC TAKE 1.5 TABLETS (7.5 MG TOTAL) BY MOUTH DAILY. What changed: See the new instructions.   aspirin EC 81 MG tablet Take 81 mg by mouth daily.   benzonatate 100 MG capsule Commonly known as: TESSALON Take 1 capsule (100 mg total) by mouth every 8 (eight) hours.   budesonide-formoterol 80-4.5 MCG/ACT inhaler Commonly known as: SYMBICORT Inhale 2 puffs into the lungs 2 (two) times daily.   chlorpheniramine-HYDROcodone 10-8 MG/5ML Suer Commonly known as: TUSSIONEX Take 5 mLs by mouth every 12 (twelve) hours as needed for cough.   citalopram 40 MG tablet Commonly known as: CELEXA TAKE 1 TABLET BY MOUTH EVERY DAY   clopidogrel 75 MG tablet Commonly known as: PLAVIX TAKE 1 TABLET (75 MG TOTAL) BY MOUTH DAILY WITH BREAKFAST.   dexamethasone 6 MG tablet Commonly known as: DECADRON Take 1 tablet (6 mg total) by mouth daily for 5 days.   dofetilide 250 MCG capsule Commonly known as: TIKOSYN Take 1 capsule (250 mcg total) by mouth 2 (two) times daily.   furosemide 40 MG tablet Commonly known as: LASIX Take 40 mg by mouth daily as needed for fluid or edema.   guaiFENesin-dextromethorphan 100-10 MG/5ML syrup Commonly known as: ROBITUSSIN DM Take 10 mLs by mouth every 4 (four) hours as needed for cough.   levothyroxine 25 MCG tablet Commonly  known as: SYNTHROID Take 25 mcg by mouth daily before breakfast.   Multi-Vite Liqd Take 1 tablet by mouth daily.   NIACIN PO Take 1 tablet by mouth daily.   nitroGLYCERIN 0.4 MG SL tablet Commonly known as: NITROSTAT Place 1 tablet (0.4 mg total) under the tongue every 5 (five) minutes x 3 doses as needed for chest pain.   OMEGA 3 PO Take 690 mg by mouth daily.   ondansetron 4 MG tablet Commonly known as: ZOFRAN Take 4 mg by mouth every 8 (eight) hours as needed for nausea or vomiting.   TURMERIC PO Take 475 mg by mouth daily.   vitamin C 500 MG tablet Commonly known as: ASCORBIC ACID Take 500 mg by mouth daily.   VITAMIN D3 PO Take 1 tablet by mouth daily.            Durable Medical Equipment  (From admission, onward)         Start     Ordered   10/15/19 1310  For home use only DME oxygen  Once       Comments: Patient Saturations on Room Air at Rest = 89%  Patient Saturations on [employer] while Ambulating =  86%  Patient Saturations on 3 Liters of oxygen while Ambulating = 91%  Please briefly explain why patient needs home oxygen: patient needs supplemental oxygen to maintain O2 saturation at an acceptable level.  Question Answer Comment  Length of Need 6 Months   Mode or (Route) Nasal cannula   Liters per Minute 3   Frequency Continuous (stationary and portable oxygen unit needed)   Oxygen conserving device Yes   Oxygen delivery system Gas      10/15/19 1309          Follow-up Information    Horald Pollen, MD Follow up in 2 week(s).   Specialty: Internal Medicine Contact information: [place] [address] [phone]        Sanda Klein, MD .   Specialty: Cardiology Contact information: [address] [place] [address] [phone]              Allergies  Allergen Reactions  . Brilinta [Ticagrelor] Shortness Of Breath  . Clonidine Derivatives Other (See Comments)    Lowers heart rate   . Atorvastatin Other (See Comments)    Possible cause of fatigue/malaise  . Crestor [Rosuvastatin Calcium] Other (See Comments)    Joint/muscle aches  . Exforge [Amlodipine Besylate-Valsartan] Itching and Rash  . Spironolactone Other (See Comments)    Contraindicated with history of hyperkalemia    Consultations:  None   Procedures/Studies: CT Angio Chest PE W/Cm &/Or Wo Cm  Result Date: 10/10/2019 CLINICAL DATA:  COVID-19 positive 6 days ago, cough, fatigue, decreased p.o. intake EXAM: CT ANGIOGRAPHY CHEST WITH CONTRAST TECHNIQUE: Multidetector CT imaging of the chest was performed using the standard protocol during bolus administration of intravenous contrast. Multiplanar CT image reconstructions and MIPs were obtained to evaluate the vascular anatomy. CONTRAST:  64mL OMNIPAQUE IOHEXOL 350 MG/ML SOLN COMPARISON:  08/26/2015, 10/10/2019 FINDINGS: Cardiovascular: This is a technically adequate evaluation of the pulmonary vasculature. There are no filling defects or pulmonary emboli. The heart is unremarkable without pericardial effusion. Postsurgical changes are seen from previous bypass surgery. Dual lead pacer again noted. Dilatation of the ascending thoracic aorta measuring up to 3.9 cm, stable since prior exam. Mild diffuse atherosclerosis. Mediastinum/Nodes: No enlarged mediastinal, hilar, or axillary lymph nodes. Thyroid gland, trachea, and esophagus demonstrate no significant findings. Lungs/Pleura: There is multifocal bilateral ground-glass airspace disease, most pronounced within the right upper lobe corresponding to chest x-ray findings. Overall the appearance is consistent with COVID-19 pneumonia. No effusion or pneumothorax. Central airways are patent. Upper Abdomen: Stable right adrenal nodule likely an adenoma. No acute upper abdominal process. Musculoskeletal: No acute or destructive bony lesions. Reconstructed images demonstrate no additional findings. Review of the MIP images  confirms the above findings. IMPRESSION: 1. No evidence of pulmonary embolus. 2. Multifocal bilateral airspace disease consistent with COVID 19 pneumonia. 3.  Aortic Atherosclerosis (ICD10-I70.0). Electronically Signed   By: Randa Ngo M.D.   On: 10/10/2019 21:31   DG Chest Port 1 View  Result Date: 10/10/2019 CLINICAL DATA:  COVID-19 positive, increasing shortness of breath EXAM: PORTABLE CHEST 1 VIEW COMPARISON:  09/05/2019 FINDINGS: Single frontal view of the chest demonstrates interval development of ground-glass airspace disease within the right midlung zone. Background interstitial prominence is unchanged. No effusion or pneumothorax. The cardiac silhouette is stable, with postsurgical changes from prior CABG. Dual lead pacer is unchanged. No acute bony abnormalities. IMPRESSION: 1. Interval development of ground-glass airspace disease within the right midlung zone, which could reflect early pneumonia. Electronically Signed  By: Randa Ngo M.D.   On: 10/10/2019 19:59   (Echo, Carotid, EGD, Colonoscopy, ERCP)    Subjective: Patient was seen and examined.  Still has some cough otherwise denies any complaints.  She feels comfortable to go home.  Has adequate support at home.  She will go home with oxygen.  She will follow up with the pulmonary provider.   Discharge Exam: Vitals:   10/15/19 2100 10/16/19 0558  BP: (!) 150/76 139/77  Pulse: 77 76  Resp: 18 18  Temp: 97.7 F (36.5 C) 98.2 F (36.8 C)  SpO2: 94% 94%   Vitals:   10/15/19 0618 10/15/19 1402 10/15/19 2100 10/16/19 0558  BP: (!) 145/86 136/72 (!) 150/76 139/77  Pulse: 79 76 77 76  Resp: 16 20 18 18   Temp: 98.1 F (36.7 C) 98.2 F (36.8 C) 97.7 F (36.5 C) 98.2 F (36.8 C)  TempSrc:  Oral Oral Oral  SpO2: 95% 93% 94% 94%  Weight:      Height:        General: Pt is alert, awake, not in acute distress Chronically sick looking.  On 1 L oxygen at rest.  Not in any distress. Cardiovascular: RRR, S1/S2 +, no  rubs, no gallops, pacemaker on left precordium. Respiratory: CTA bilaterally, no wheezing, no rhonchi, mostly upper airway conducted sounds. Abdominal: Soft, NT, ND, bowel sounds + Extremities: no edema, no cyanosis    The results of significant diagnostics from this hospitalization (including imaging, microbiology, ancillary and laboratory) are listed below for reference.     Microbiology: Recent Results (from the past 240 hour(s))  SARS Coronavirus 2 by RT PCR (hospital order, performed in [facility] hospital lab) Nasopharyngeal Nasopharyngeal Swab     Status: Abnormal   Collection Time: 10/10/19  7:21 PM   Specimen: Nasopharyngeal Swab  Result Value Ref Range Status   SARS Coronavirus 2 POSITIVE (A) NEGATIVE Final    Comment: RESULT CALLED TO, READ BACK BY AND VERIFIED WITH: HODGES,I. RN @2104  ON 08.31.2021 BY COHEN,K (NOTE) SARS-CoV-2 target nucleic acids are DETECTED  SARS-CoV-2 RNA is generally detectable in upper respiratory specimens  during the acute phase of infection.  Positive results are indicative  of the presence of the identified virus, but do not rule out bacterial infection or co-infection with other pathogens not detected by the test.  Clinical correlation with patient history and  other diagnostic information is necessary to determine patient infection status.  The expected result is negative.  Fact Sheet for Patients:   StrictlyIdeas.no   Fact Sheet for Healthcare Providers:   BankingDealers.co.za    This test is not yet approved or cleared by the [place] FDA and  has been authorized for detection and/or diagnosis of SARS-CoV-2 by FDA under an Emergency Use Authorization (EUA).  This EUA will remain in effect (meanin g this test can be used) for the duration of  the COVID-19 declaration under Section 564(b)(1) of the Act, 21 U.S.C. section 360-bbb-3(b)(1), unless the authorization is terminated or  revoked sooner.  Performed at [facility] [place] [address], [place], [place] [address]   Blood Culture (routine x 2)     Status: None   Collection Time: 10/10/19  7:21 PM   Specimen: BLOOD  Result Value Ref Range Status   Specimen Description   Final    BLOOD LEFT ANTECUBITAL Performed at [place] [address], [place], [place] [address]    Special Requests   Final  BOTTLES DRAWN AEROBIC AND ANAEROBIC Blood Culture adequate volume Performed at [facility] [address] [place], [place] [address]    Culture   Final    NO GROWTH 5 DAYS Performed at [facility], [place] [address], [place], [place] [address]    Report Status 10/15/2019 FINAL  Final  Blood Culture (routine x 2)     Status: None   Collection Time: 10/10/19  7:40 PM   Specimen: BLOOD  Result Value Ref Range Status   Specimen Description   Final    BLOOD BLOOD LEFT HAND Performed at [place] [address], [place], [place] [address]    Special Requests   Final    BOTTLES DRAWN AEROBIC AND ANAEROBIC Blood Culture adequate volume Performed at [place] [address], [place], [place] [address]    Culture   Final    NO GROWTH 5 DAYS Performed at [facility], [place] [address], [place], [place] [address]    Report Status 10/15/2019 FINAL  Final     Labs: BNP (last 3 results) No results for input(s): BNP in the last 8760 hours. Basic Metabolic Panel: Recent Labs  Lab 10/11/19 0250 10/12/19 0342 10/13/19 0348 10/14/19 0408 10/15/19 0522  NA 131* 134* 134* 134* 135  K 4.6 4.6 4.4 4.4 4.5  CL 99 103 101 99 96*  CO2 20* 24 23 24 26   GLUCOSE 139* 168* 161* 163* 173*  BUN 21 19 18 16 20   CREATININE 0.97 0.65 0.59 0.55 0.61  CALCIUM 8.2* 8.1* 8.0* 8.0* 8.5*  MG 2.0 2.1 2.1 2.0 2.1  PHOS 3.2 3.5 3.2 2.9 3.0   Liver Function Tests: Recent Labs  Lab 10/11/19 0250 10/12/19 0342  10/13/19 0348 10/14/19 0408 10/15/19 0522  AST 31 23 25 26 22   ALT 14 15 15 17 16   ALKPHOS 84 81 83 82 86  BILITOT 0.6 0.3 0.3 0.5 0.5  PROT 6.8 6.4* 6.2* 6.1* 6.0*  ALBUMIN 3.5 3.3* 3.2* 3.1* 3.2*   No results for input(s): LIPASE, AMYLASE in the last 168 hours. No results for input(s): AMMONIA in the last 168 hours. CBC: Recent Labs  Lab 10/11/19 0250 10/12/19 0342 10/13/19 0348 10/14/19 0408 10/15/19 0522  WBC 5.0 2.6* 3.2* 3.8* 4.0  NEUTROABS 3.7 1.5* 2.1 2.8 2.9  HGB 13.6 13.5 14.1 14.6 14.5  HCT 40.0 40.0 41.5 43.6 42.7  MCV 89.5 89.7 89.2 90.6 88.2  PLT 145* 149* 183 211 236   Cardiac Enzymes: No results for input(s): CKTOTAL, CKMB, CKMBINDEX, TROPONINI in the last 168 hours. BNP: Invalid input(s): POCBNP CBG: No results for input(s): GLUCAP in the last 168 hours. D-Dimer Recent Labs    10/14/19 0408 10/15/19 0522  DDIMER 0.59* 0.87*   Hgb A1c No results for input(s): HGBA1C in the last 72 hours. Lipid Profile No results for input(s): CHOL, HDL, LDLCALC, TRIG, CHOLHDL, LDLDIRECT in the last 72 hours. Thyroid function studies No results for input(s): TSH, T4TOTAL, T3FREE, THYROIDAB in the last 72 hours.  Invalid input(s): FREET3 Anemia work up Recent Labs    10/14/19 0408 10/15/19 0522  FERRITIN 250 269   Urinalysis    Component Value Date/Time   COLORURINE AMBER (A) 02/07/2018 0946   APPEARANCEUR CLEAR 02/07/2018 0946   LABSPEC 1.040 (H) 02/07/2018 0946   PHURINE 5.0 02/07/2018 [place] 02/07/2018 [place] 12/31/2009 [place] 02/07/2018 0946  BILIRUBINUR NEGATIVE 02/07/2018 0946   BILIRUBINUR small (A) 02/04/2017 1454   KETONESUR 20 (A) 02/07/2018 0946   PROTEINUR 30 (A) 02/07/2018 0946   UROBILINOGEN 0.2 02/04/2017 1454   UROBILINOGEN 1.0 06/03/2012 1610   NITRITE NEGATIVE 02/07/2018 0946   LEUKOCYTESUR NEGATIVE 02/07/2018 0946   Sepsis Labs Invalid input(s): PROCALCITONIN,  WBC,   LACTICIDVEN Microbiology Recent Results (from the past 240 hour(s))  SARS Coronavirus 2 by RT PCR (hospital order, performed in [facility] hospital lab) Nasopharyngeal Nasopharyngeal Swab     Status: Abnormal   Collection Time: 10/10/19  7:21 PM   Specimen: Nasopharyngeal Swab  Result Value Ref Range Status   SARS Coronavirus 2 POSITIVE (A) NEGATIVE Final    Comment: RESULT CALLED TO, READ BACK BY AND VERIFIED WITH: HODGES,I. RN @2104  ON 08.31.2021 BY COHEN,K (NOTE) SARS-CoV-2 target nucleic acids are DETECTED  SARS-CoV-2 RNA is generally detectable in upper respiratory specimens  during the acute phase of infection.  Positive results are indicative  of the presence of the identified virus, but do not rule out bacterial infection or co-infection with other pathogens not detected by the test.  Clinical correlation with patient history and  other diagnostic information is necessary to determine patient infection status.  The expected result is negative.  Fact Sheet for Patients:   StrictlyIdeas.no   Fact Sheet for Healthcare Providers:   BankingDealers.co.za    This test is not yet approved or cleared by the [place] FDA and  has been authorized for detection and/or diagnosis of SARS-CoV-2 by FDA under an Emergency Use Authorization (EUA).  This EUA will remain in effect (meanin g this test can be used) for the duration of  the COVID-19 declaration under Section 564(b)(1) of the Act, 21 U.S.C. section 360-bbb-3(b)(1), unless the authorization is terminated or revoked sooner.  Performed at [facility], [place] [address], [place], [place] [address]   Blood Culture (routine x 2)     Status: None   Collection Time: 10/10/19  7:21 PM   Specimen: BLOOD  Result Value Ref Range Status   Specimen Description   Final    BLOOD LEFT ANTECUBITAL Performed at [place] [address]  [place], [place] [address]    Special Requests   Final    BOTTLES DRAWN AEROBIC AND ANAEROBIC Blood Culture adequate volume Performed at [facility] [address], [place], [place] [address]    Culture   Final    NO GROWTH 5 DAYS Performed at [facility], [place] [address], [place], [place] [address]    Report Status 10/15/2019 FINAL  Final  Blood Culture (routine x 2)     Status: None   Collection Time: 10/10/19  7:40 PM   Specimen: BLOOD  Result Value Ref Range Status   Specimen Description   Final    BLOOD BLOOD LEFT HAND Performed at [facility] [address], [place], [place] [address]    Special Requests   Final    BOTTLES DRAWN AEROBIC AND ANAEROBIC Blood Culture adequate volume Performed at [facility] [address], [place], [place] [address]    Culture   Final    NO GROWTH 5 DAYS Performed at [facility], [place] [address] [place], [place] [address]    Report Status 10/15/2019 FINAL  Final     Time coordinating discharge: 40 minutes  SIGNED:   Barb Merino, MD  [facility] 10/16/2019, 12:03  PM

## 2019-10-16 NOTE — TOC Progression Note (Addendum)
Transition of Care [facility]) - Progression Note    Patient Details  Name: [name] MRN: [id number] Date of Birth: 03-28-1940  Transition of [facility]) CM/SW Contact  Leeroy Cha, RN Phone Number: 10/16/2019, 9:27 AM  Clinical Narrative:    Text message sent to [facility] with [employer] for home o2 orders at 0928. dcd to home with o2       Expected Discharge Plan and Services           Expected Discharge Date: 10/15/19                                     Social Determinants of Health (SDOH) Interventions    Readmission Risk Interventions No flowsheet data found.

## 2019-11-01 ENCOUNTER — Telehealth: Payer: Self-pay

## 2019-11-01 ENCOUNTER — Other Ambulatory Visit: Payer: Self-pay

## 2019-11-01 ENCOUNTER — Ambulatory Visit (INDEPENDENT_AMBULATORY_CARE_PROVIDER_SITE_OTHER): Payer: Medicare HMO | Admitting: Emergency Medicine

## 2019-11-01 ENCOUNTER — Encounter: Payer: Self-pay | Admitting: Emergency Medicine

## 2019-11-01 VITALS — BP 126/73 | HR 86 | Temp 98.0°F | Ht 64.0 in | Wt 149.2 lb

## 2019-11-01 DIAGNOSIS — J1282 Pneumonia due to coronavirus disease 2019: Secondary | ICD-10-CM | POA: Diagnosis not present

## 2019-11-01 DIAGNOSIS — Z8616 Personal history of COVID-19: Secondary | ICD-10-CM | POA: Diagnosis not present

## 2019-11-01 DIAGNOSIS — Z09 Encounter for follow-up examination after completed treatment for conditions other than malignant neoplasm: Secondary | ICD-10-CM | POA: Diagnosis not present

## 2019-11-01 DIAGNOSIS — J449 Chronic obstructive pulmonary disease, unspecified: Secondary | ICD-10-CM

## 2019-11-01 DIAGNOSIS — U071 COVID-19: Secondary | ICD-10-CM

## 2019-11-01 DIAGNOSIS — Z8679 Personal history of other diseases of the circulatory system: Secondary | ICD-10-CM | POA: Diagnosis not present

## 2019-11-01 DIAGNOSIS — M5010 Cervical disc disorder with radiculopathy, unspecified cervical region: Secondary | ICD-10-CM

## 2019-11-01 NOTE — Progress Notes (Signed)
[name] 79 y.o.   Chief Complaint  Patient presents with  . Hospitalization Follow-up    in hospital from [place]   . left hand and arm paralysis     had surgery prior to going into hospital with covid   Admit date: 10/10/2019 Discharge date: 10/16/2019  Admitted From: Home Disposition: Home  Recommendations for Outpatient Follow-up:  1. Follow up with PCP in 1-2 weeks   Home Health: Not applicable Equipment/Devices: Oxygen  Discharge Condition: Stable CODE STATUS: Full code Diet recommendation: Low-salt diet  Discharge summary: 78 year old female with history of coronary artery disease, chronic kidney disease stage IIIa, status post CABG, chronic A. fib and sick sinus syndrome status post pacemaker on Tikosyn, depression, hypertension and hyperlipidemia, unvaccinated who presented to the ER with about a week of fatigue and fever with shortness of breath.  Diagnosed with COVID-19 about a week ago before coming to the hospital.  Admitted to hospital on 8/31.  Chest x-ray with groundglass airspace disease.  CTA with multifocal bilateral pneumonia, negative for PE.  Patient was also having unexplained cough for last 2 years for which she is going to see Dr. Halford Chessman with pulmonary in 3 weeks.  Patient was admitted to the hospital with hypoxia related to COVID-19 pneumonia.  She was treated with aggressive bronchodilator therapy, chest physiotherapy and other supportive treatment as well as remdesivir that she finished therapy.  She was treated with dexamethasone, will change to oral dexamethasone to complete 5 more days of therapy. Patient has finished COVID-19 directed therapies, however she still has some cough and hypoxia on mobility.  With medical stabilization she will be going home, will order supplemental oxygen until clinical recovery.  Patient was highly encouraged to take COVID-19 vaccinations after she recovers from current episode.  Patient is agreeable. She also has  chronic cough that is worsening her current recovery.  Patient is already on bronchodilator therapy and long-acting steroids inhaler at home that she will continue.  She is scheduled to see a pulmonary provider in 3 weeks that she will keep up the appointment.  Other multiple medical issues including coronary artery disease, hypertension, chronic kidney disease, hypothyroidism they remained stable and she will resume all her medications.  Patient has been taking Tikosyn uninterrupted and she will continue to take.  She has a pacemaker.  She is not on anticoagulation but she is on aspirin and Plavix.  Patient medically stabilizing.  Able to go home today with adequate support at home.  Oxygen to be arranged.  Discharge Diagnoses:  Principal Problem:   Acute respiratory failure due to COVID-19 [facility]) Active Problems:   Anxiety   Essential hypertension   CAD (coronary artery disease)   CKD (chronic kidney disease), stage II   Dyslipidemia   Hypothyroidism (acquired)   Hyponatremia   HISTORY OF PRESENT ILLNESS: This is a 79 y.o. female here for hospital follow-up.  Admitted on August 31st with bilateral Covid pneumonia and discharged on 10/16/2019. Treated with oxygen, remdesivir, and corticosteroids along with IV fluids.  Did well.  Today feels about 50% better. Has appointment with pulmonologist coming up. Also complaining of left arm occasional weakness with tingling.  Recently had MRI of cervical spine with significant abnormalities. Needs neurosurgical evaluation. Denies difficulty breathing, chest pain, fever or any other significant symptoms. No other complaints or medical concerns. Hospital discharge summary reviewed with patient.  HPI   Prior to Admission medications   Medication Sig Start Date End Date Taking? Authorizing Provider  amLODipine (NORVASC) 5 MG tablet TAKE 1.5 TABLETS (7.5 MG TOTAL) BY MOUTH DAILY. Patient taking differently: Take 7.5 mg by mouth daily.  08/30/19   Yes Lorretta Harp, MD  aspirin EC 81 MG tablet Take 81 mg by mouth daily.    Yes [provider]  Cholecalciferol (VITAMIN D3 PO) Take 1 tablet by mouth daily.   Yes [provider]  citalopram (CELEXA) 40 MG tablet TAKE 1 TABLET BY MOUTH EVERY DAY 10/09/19  Yes Damica Gravlin, Ines Bloomer, MD  clopidogrel (PLAVIX) 75 MG tablet TAKE 1 TABLET (75 MG TOTAL) BY MOUTH DAILY WITH BREAKFAST. 10/09/19  Yes Lorretta Harp, MD  dofetilide (TIKOSYN) 250 MCG capsule Take 1 capsule (250 mcg total) by mouth 2 (two) times daily. 04/06/19  Yes Croitoru, Mihai, MD  furosemide (LASIX) 40 MG tablet Take 40 mg by mouth daily as needed for fluid or edema.    Yes [provider]  levothyroxine (SYNTHROID, LEVOTHROID) 25 MCG tablet Take 25 mcg by mouth daily before breakfast.  04/03/16  Yes [provider]  Multiple Vitamins-Minerals (MULTI-VITE) LIQD Take 1 tablet by mouth daily.    Yes [provider]  Omega-3 Fatty Acids (OMEGA 3 PO) Take 690 mg by mouth daily.   Yes [provider]  ondansetron (ZOFRAN) 4 MG tablet Take 4 mg by mouth every 8 (eight) hours as needed for nausea or vomiting.   Yes [provider]  TURMERIC PO Take 475 mg by mouth daily.   Yes [provider]  vitamin C (ASCORBIC ACID) 500 MG tablet Take 500 mg by mouth daily.   Yes [provider]  albuterol (VENTOLIN HFA) 108 (90 Base) MCG/ACT inhaler Inhale 1-2 puffs into the lungs every 6 (six) hours as needed for wheezing or shortness of breath. Patient not taking: Reported on 11/01/2019 09/05/19   Darr, Marguerita Beards, PA-C  budesonide-formoterol [facility]) 80-4.5 MCG/ACT inhaler Inhale 2 puffs into the lungs 2 (two) times daily. Patient not taking: Reported on 11/01/2019 09/14/19   Horald Pollen, MD  NIACIN PO Take 1 tablet by mouth daily. Patient not taking: Reported on 11/01/2019    [provider]  nitroGLYCERIN (NITROSTAT) 0.4 MG SL tablet Place 1 tablet (0.4  mg total) under the tongue every 5 (five) minutes x 3 doses as needed for chest pain. Patient not taking: Reported on 11/01/2019 05/15/15   Consuelo Pandy, PA-C    Allergies  Allergen Reactions  . Brilinta [Ticagrelor] Shortness Of Breath  . Clonidine Derivatives Other (See Comments)    Lowers heart rate  . Atorvastatin Other (See Comments)    Possible cause of fatigue/malaise  . Crestor [Rosuvastatin Calcium] Other (See Comments)    Joint/muscle aches  . Exforge [Amlodipine Besylate-Valsartan] Itching and Rash  . Spironolactone Other (See Comments)    Contraindicated with history of hyperkalemia    Patient Active Problem List   Diagnosis Date Noted  . Acute respiratory failure due to COVID-19 ([place]) 10/10/2019  . Hyponatremia 10/10/2019  . Hypothyroidism (acquired) 02/08/2018  . Coronary artery disease involving coronary bypass graft of native heart without angina pectoris 10/22/2017  . Dyslipidemia 10/22/2017  . Cerebrovascular small vessel disease 04/27/2017  . Gait abnormality 03/25/2017  . Cerebrovascular disease 03/25/2017  . Claudication in peripheral vascular disease ([place]) 02/15/2017  . Hyperparathyroidism ([place]) 05/13/2016  . Pacemaker 01/10/2016  . Benign neoplasm of ascending colon   . Long term current use of anticoagulant   . CKD (chronic kidney disease), stage II   .  H/O amiodarone therapy 12/04/2015  . Chronic diastolic heart failure ([place]) 10/19/2015  . Atypical atrial flutter ([place])   . Paroxysmal atrial flutter ([place]) 08/23/2015  . Chronic fatigue 06/27/2015  . Stented coronary artery   . S/P CABG x 3 01/04/15 01/10/2015  . CAD (coronary artery disease) 01/04/2015  . PAF (paroxysmal atrial fibrillation) ([place]) 01/22/2012  . Sinus node dysfunction ([place]) 01/22/2012  . S/P placement of cardiac pacemaker, medtronic adapta 01/21/12 01/22/2012  . PVD (peripheral vascular disease), hx stents to bil SFAs 02/2010 01/22/2012  . Hyperlipidemia 12/31/2009  . Anxiety  12/31/2009  . DEPRESSION 12/31/2009  . Essential hypertension 12/31/2009  . Coronary atherosclerosis 12/31/2009  . OSTEOARTHRITIS, HAND 12/31/2009  . [place] DISEASE, CERVICAL 12/31/2009  . [place] DISEASE, LUMBAR 12/31/2009  . UNSPECIFIED URINARY INCONTINENCE 12/31/2009    Past Medical History:  Diagnosis Date  . ANXIETY 12/31/2009  . Aortic insufficiency    a. mod by echo 2017.  [place] Kitchen CAD (coronary artery disease)    a. s/p CABGx3 and LAA clipping in 12/2014, NSTEMI 05/2015 s/p DES to native RCA and SVG-dRCA; occluded ramus-SVG was treated medically). c. neg nuc 10/2015 at [facility].  . Chronic diastolic CHF (congestive heart failure) ([place])   . Chronic fatigue   . CKD (chronic kidney disease), stage II   . DEPRESSION 12/31/2009  . [place] DISEASE, CERVICAL 12/31/2009  . [place] DISEASE, LUMBAR 12/31/2009  . DIVERTICULITIS, HX OF 12/31/2009  . Essential hypertension   . Gait abnormality 03/25/2017  . GLUCOSE INTOLERANCE 12/31/2009  . Habitual alcohol use   . History of stroke    self reports " they say i have had some pin strokes"   . Hypercalcemia   . Hyperkalemia   . Hyperlipidemia 12/31/2009  . Hypothyroidism   . MENOPAUSE, EARLY 12/31/2009  . NSTEMI (non-ST elevated myocardial infarction) ([place]) 05/11/2015  . Orthostatic hypotension   . OSTEOARTHRITIS, HAND   . Pacemaker 01/21/2012   MDT Adapta dual chamber  . Paroxysmal atrial flutter ([place])   . Persistent atrial fibrillation ([place])   . PVD (peripheral vascular disease), hx stents to bil SFAs 02/2010   . Rash, skin     superficial raised red pencil point sized rash bilateral forearms; states " it started when i started the Plavix "   . Ruptured left breast implant   . Symptomatic sinus bradycardia 01/22/2012  . Syncope    a. 11/2015 ? due to medications.  . Syncope    reports on 05-10-17 " i passed out 2 months ago in the bathroom and broke some ribs"    . Tachy-brady syndrome ([place])    a. s/p MDT PPM 2013.  . Tricuspid regurgitation      Past Surgical History:  Procedure Laterality Date  . ABDOMINAL AORTOGRAM W/LOWER EXTREMITY Bilateral 02/15/2017   Procedure: ABDOMINAL AORTOGRAM W/LOWER EXTREMITY;  Surgeon: Lorretta Harp, MD;  Location: [place] CV [facility];  Service: Cardiovascular;  Laterality: Bilateral;  bilat  . ABDOMINAL AORTOGRAM W/LOWER EXTREMITY N/A 10/06/2018   Procedure: ABDOMINAL AORTOGRAM W/LOWER EXTREMITY;  Surgeon: Lorretta Harp, MD;  Location: [place] [facility];  Service: Cardiovascular;  Laterality: N/A;  . Ablation of typical atrial flutter (CTI line)  09/05/2016   Dr Antonieta Pert at [facility]   . [place]   Removed 01/2018  . BREAST IMPLANT REMOVAL Bilateral 01/14/2018   Procedure: REMOVAL BILATERAL BREAST IMPLANTS;  Surgeon: Irene Limbo, MD;  Location: [place];  Service: Plastics;  Laterality: Bilateral;  .  CAPSULECTOMY Bilateral 01/14/2018   Procedure: CAPSULECTOMY;  Surgeon: Irene Limbo, MD;  Location: [place];  Service: Plastics;  Laterality: Bilateral;  . CARDIAC CATHETERIZATION N/A 01/01/2015   Procedure: Left Heart Cath and Coronary Angiography;  Surgeon: Jettie Booze, MD;  Location: [place] [facility];  Service: Cardiovascular;  Laterality: N/A;  . CARDIAC CATHETERIZATION N/A 05/12/2015   Procedure: Left Heart Cath and Coronary Angiography;  Surgeon: Troy Sine, MD;  Location: [place] [facility];  Service: Cardiovascular;  Laterality: N/A;  . CARDIAC CATHETERIZATION N/A 05/12/2015   Procedure: Coronary Stent Intervention;  Surgeon: Troy Sine, MD;  Location: [place] [facility];  Service: Cardiovascular;  Laterality: N/A;  . CARDIOVERSION N/A 02/01/2015   Procedure: CARDIOVERSION;  Surgeon: Lelon Perla, MD;  Location: [facility] ENDOSCOPY;  Service: Cardiovascular;  Laterality: N/A;  . CARDIOVERSION N/A 08/30/2015   Procedure: CARDIOVERSION;  Surgeon: Sanda Klein, MD;  Location: [place] ENDOSCOPY;  Service: Cardiovascular;   Laterality: N/A;  . CARPAL TUNNEL RELEASE  2008   "right hand/thumb; carpal tunnel repair; got rid of arthritis" (01/21/2012)  . CLIPPING OF ATRIAL APPENDAGE N/A 01/04/2015   Procedure: CLIPPING OF ATRIAL APPENDAGE;  Surgeon: Grace Isaac, MD;  Location: [place];  Service: Open Heart Surgery;  Laterality: N/A;  . COLONOSCOPY N/A 12/21/2015   Procedure: COLONOSCOPY;  Surgeon: Milus Banister, MD;  Location: [facility] ENDOSCOPY;  Service: Endoscopy;  Laterality: N/A;  . CORONARY ARTERY BYPASS GRAFT N/A 01/04/2015   Procedure: CORONARY ARTERY BYPASS GRAFTING (CABG) x 3 using left internal mammory artery and greater saphenous vein right leg harvested endoscopically.;  Surgeon: Grace Isaac, MD;  LIMA-LAD, SVG-RI, SVG-PDA  . ESOPHAGOGASTRODUODENOSCOPY (EGD) WITH PROPOFOL N/A 12/18/2015   Procedure: ESOPHAGOGASTRODUODENOSCOPY (EGD) WITH PROPOFOL;  Surgeon: Milus Banister, MD;  Location: [facility]OSCOPY;  Service: Endoscopy;  Laterality: N/A;  . FACELIFT, LOWER 2/3  1995   "mini" (01/21/2012)  . GIVENS CAPSULE STUDY N/A 12/21/2015   Procedure: GIVENS CAPSULE STUDY;  Surgeon: Milus Banister, MD;  Location: [facility]OSCOPY;  Service: Endoscopy;  Laterality: N/A;  . Lower Arterial Examination  10/28/2011   R. SFA stent mild-moderate mixed density plaque with elevated velocities consistent with 50% diameter reduction. L. SFA stent moderate mixed denisty plaque at mid to distal level consistent with 50-69% diameter reduction.  . LOWER EXTREMITY ANGIOGRAPHY N/A 02/15/2017   Procedure: LOWER EXTREMITY ANGIOGRAPHY;  Surgeon: Lorretta Harp, MD;  Location: [place] CV [facility];  Service: Cardiovascular;  Laterality: N/A;  . OOPHORECTOMY  ~1979  . PARATHYROIDECTOMY N/A 05/13/2017   Procedure: PARATHYROIDECTOMY;  Surgeon: Armandina Gemma, MD;  Location: [facility];  Service: General;  Laterality: N/A;  . PARTIAL COLECTOMY  2010  . PERIPHERAL ARTERIAL STENT GRAFT  2012; 2012   "LLE; RLE" (01/21/2012)  . PERIPHERAL VASCULAR  BALLOON ANGIOPLASTY Right 02/15/2017   Procedure: PERIPHERAL VASCULAR BALLOON ANGIOPLASTY;  Surgeon: Lorretta Harp, MD;  Location: [place] CV [facility];  Service: Cardiovascular;  Laterality: Right;  SFA    . PERIPHERAL VASCULAR INTERVENTION Right 10/06/2018   Procedure: PERIPHERAL VASCULAR INTERVENTION;  Surgeon: Lorretta Harp, MD;  Location: [place] [facility];  Service: Cardiovascular;  Laterality: Right;  . PERMANENT PACEMAKER INSERTION N/A 01/21/2012   Medtronic Adapta L implanted by Dr Sallyanne Kuster for tachy/brady syndrome  . POSTERIOR CERVICAL LAMINECTOMY  1985  . TEE WITHOUT CARDIOVERSION N/A 01/04/2015   Procedure: TRANSESOPHAGEAL ECHOCARDIOGRAM (TEE);  Surgeon: Grace Isaac, MD;  Location: [place];  Service:  Open Heart Surgery;  Laterality: N/A;  . TEE WITHOUT CARDIOVERSION N/A 02/01/2015   Procedure: TRANSESOPHAGEAL ECHOCARDIOGRAM (TEE);  Surgeon: Lelon Perla, MD;  Location: [facility] ENDOSCOPY;  Service: Cardiovascular;  Laterality: N/A;  . TEE WITHOUT CARDIOVERSION N/A 08/30/2015   Procedure: TRANSESOPHAGEAL ECHOCARDIOGRAM (TEE);  Surgeon: Sanda Klein, MD;  Location: [facility] ENDOSCOPY;  Service: Cardiovascular;  Laterality: N/A;  . [place] History   Socioeconomic History  . Marital status: Widowed    Spouse name: Not on file  . Number of children: 3  . Years of education: Not on file  . Highest education level: Not on file  Occupational History  . 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 Smoking status: Former Smoker    Packs/day: 0.75    Years: 10.00    Pack years: 7.50    Types: Cigarettes    Quit date: 2008    Years since quitting: 13.7  . Smokeless tobacco: Never Used  . Tobacco comment: 01/21/2012 "quit smoking ~ 2002"  Vaping Use  . Vaping Use: Never used  Substance and Sexual Activity  . Alcohol use: Yes    Comment: daily coctail  . Drug use: No  . Sexual activity: Not Currently  Other Topics  Concern  . Not on file  Social History Narrative   Lives in [place].  Retired.   Social Determinants of Health   Financial Resource Strain:   . Difficulty of Paying Living Expenses: Not on file  Food Insecurity:   . Worried About Charity fundraiser in the Last Year: Not on file  . Ran Out of Food in the Last Year: Not on file  Transportation Needs:   . Lack of Transportation (Medical): Not on file  . Lack of Transportation (Non-Medical): Not on file  Physical Activity:   . Days of Exercise per Week: Not on file  . Minutes of Exercise per Session: Not on file  Stress:   . Feeling of Stress : Not on file  Social Connections:   . Frequency of Communication with Friends and Family: Not on file  . Frequency of Social Gatherings with Friends and Family: Not on file  . Attends Religious Services: Not on file  . Active Member of Clubs or Organizations: Not on file  . Attends Archivist Meetings: Not on file  . Marital Status: Not on file  Intimate Partner Violence:   . Fear of Current or Ex-Partner: Not on file  . Emotionally Abused: Not on file  . Physically Abused: Not on file  . Sexually Abused: Not on file    Family History  Problem Relation Age of Onset  . Heart disease Mother   . Hypertension Mother   . Stroke Mother   . Stroke Father   . Heart disease Father   . Heart disease Brother   . Hypertension Brother        2 brothers  . CVA Brother        2 brothers  . Heart attack Brother        2 brothers     Review of Systems  Constitutional: Negative.  Negative for chills and fever.  HENT: Negative.  Negative for congestion and sore throat.   Respiratory: Negative.  Negative for cough and shortness of breath.   Cardiovascular: Negative.  Negative for chest pain and palpitations.  Gastrointestinal: Negative for abdominal pain, diarrhea, nausea and vomiting.  Genitourinary:  Negative.  Negative for dysuria.  Musculoskeletal: Negative.  Negative for  myalgias.  Skin: Negative.  Negative for rash.  Neurological: Positive for sensory change (Left arm). Negative for dizziness, seizures, loss of consciousness and headaches.  All other systems reviewed and are negative.   Today's Vitals   11/01/19 1434  BP: 126/73  Pulse: 86  Temp: 98 F (36.7 C)  TempSrc: Temporal  SpO2: 94%  Weight: 149 lb 3.2 oz (67.7 kg)  Height: 5\' 4"  (1.626 m)   Body mass index is 25.61 kg/m.  Physical Exam Vitals reviewed.  Constitutional:      Appearance: Normal appearance.  HENT:     Head: Normocephalic.  Eyes:     Extraocular Movements: Extraocular movements intact.     Pupils: Pupils are equal, round, and reactive to light.  Cardiovascular:     Rate and Rhythm: Normal rate and regular rhythm.     Pulses: Normal pulses.     Heart sounds: Normal heart sounds.  Pulmonary:     Effort: Pulmonary effort is normal.     Breath sounds: Normal breath sounds.  Abdominal:     Palpations: Abdomen is soft.     Tenderness: There is no abdominal tenderness.  Musculoskeletal:     Cervical back: No tenderness. Decreased range of motion.  Lymphadenopathy:     Cervical: No cervical adenopathy.  Skin:    General: Skin is warm and dry.     Capillary Refill: Capillary refill takes less than 2 seconds.  Neurological:     General: No focal deficit present.     Mental Status: She is alert and oriented to person, place, and time.     Sensory: No sensory deficit.     Motor: No weakness.     Coordination: Coordination normal.     Gait: Gait normal.  Psychiatric:        Mood and Affect: Mood normal.        Behavior: Behavior normal.    A total of 45 minutes was spent with the patient, greater than 50% of which was in counseling/coordination of care regarding recent Covid pneumonia admission to the hospital, review of discharge summary, review of most recent blood work results, review of most recent imaging reports, review of all medications, prognosis, and need  for follow-up.   ASSESSMENT & PLAN: Clinically stable and recovering well from Covid pneumonia.  No red flag signs or symptoms.  Continue present treatment and follow-up in 4 weeks. Floreen was seen today for hospitalization follow-up and left hand and arm paralysis .  Diagnoses and all orders for this visit:  Hospital discharge follow-up  Cervical disc disorder with radiculopathy of cervical region -     Ambulatory referral to Neurosurgery  Chronic obstructive pulmonary disease, unspecified COPD type ([place])  History of coronary artery disease  Pneumonia due to COVID-19 virus Comments: Much improved    Patient Instructions       If you have lab work done today you will be contacted with your lab results within the next 2 weeks.  If you have not heard from [place] then please contact us. The fastest way to get your results is to register for My Chart.   IF you received an x-ray today, you will receive an invoice from [facility]. Please contact [facility] at [phone] with questions or concerns regarding your invoice.   IF you received labwork today, you will receive an invoice from [place]. Please contact [facility] at [phone] with questions or  concerns regarding your invoice.   Our billing staff will not be able to assist you with questions regarding bills from these companies.  You will be contacted with the lab results as soon as they are available. The fastest way to get your results is to activate your My Chart account. Instructions are located on the last page of this paperwork. If you have not heard from [place] regarding the results in 2 weeks, please contact this office.      Health Maintenance After Age 36 After age 6, you are at a higher risk for certain long-term diseases and infections as well as injuries from falls. Falls are a major cause of broken bones and head injuries in people who are older than age 68. Getting regular preventive care can  help to keep you healthy and well. Preventive care includes getting regular testing and making lifestyle changes as recommended by your health care provider. Talk with your health care provider about:  Which screenings and tests you should have. A screening is a test that checks for a disease when you have no symptoms.  A diet and exercise plan that is right for you. What should I know about screenings and tests to prevent falls? Screening and testing are the best ways to find a health problem early. Early diagnosis and treatment give you the best chance of managing medical conditions that are common after age 55. Certain conditions and lifestyle choices may make you more likely to have a fall. Your health care provider may recommend:  Regular vision checks. Poor vision and conditions such as cataracts can make you more likely to have a fall. If you wear glasses, make sure to get your prescription updated if your vision changes.  Medicine review. Work with your health care provider to regularly review all of the medicines you are taking, including over-the-counter medicines. Ask your health care provider about any side effects that may make you more likely to have a fall. Tell your health care provider if any medicines that you take make you feel dizzy or sleepy.  Osteoporosis screening. Osteoporosis is a condition that causes the bones to get weaker. This can make the bones weak and cause them to break more easily.  Blood pressure screening. Blood pressure changes and medicines to control blood pressure can make you feel dizzy.  Strength and balance checks. Your health care provider may recommend certain tests to check your strength and balance while standing, walking, or changing positions.  Foot health exam. Foot pain and numbness, as well as not wearing proper footwear, can make you more likely to have a fall.  Depression screening. You may be more likely to have a fall if you have a fear of  falling, feel emotionally low, or feel unable to do activities that you used to do.  Alcohol use screening. Using too much alcohol can affect your balance and may make you more likely to have a fall. What actions can I take to lower my risk of falls? General instructions  Talk with your health care provider about your risks for falling. Tell your health care provider if: ? You fall. Be sure to tell your health care provider about all falls, even ones that seem minor. ? You feel dizzy, sleepy, or off-balance.  Take over-the-counter and prescription medicines only as told by your health care provider. These include any supplements.  Eat a healthy diet and maintain a healthy weight. A healthy diet includes low-fat dairy products, low-fat (lean)  meats, and fiber from whole grains, beans, and lots of fruits and vegetables. Home safety  Remove any tripping hazards, such as rugs, cords, and clutter.  Install safety equipment such as grab bars in bathrooms and safety rails on stairs.  Keep rooms and walkways well-lit. Activity   Follow a regular exercise program to stay fit. This will help you maintain your balance. Ask your health care provider what types of exercise are appropriate for you.  If you need a cane or walker, use it as recommended by your health care provider.  Wear supportive shoes that have nonskid soles. Lifestyle  Do not drink alcohol if your health care provider tells you not to drink.  If you drink alcohol, limit how much you have: ? 0-1 drink a day for women. ? 0-2 drinks a day for men.  Be aware of how much alcohol is in your drink. In the [place] one drink equals one typical bottle of beer (12 oz), one-half glass of wine (5 oz), or one shot of hard liquor (1 oz).  Do not use any products that contain nicotine or tobacco, such as cigarettes and e-cigarettes. If you need help quitting, ask your health care provider. Summary  Having a healthy lifestyle and getting  preventive care can help to protect your health and wellness after age 50.  Screening and testing are the best way to find a health problem early and help you avoid having a fall. Early diagnosis and treatment give you the best chance for managing medical conditions that are more common for people who are older than age 71.  Falls are a major cause of broken bones and head injuries in people who are older than age 45. Take precautions to prevent a fall at home.  Work with your health care provider to learn what changes you can make to improve your health and wellness and to prevent falls. This information is not intended to replace advice given to you by your health care provider. Make sure you discuss any questions you have with your health care provider. Document Revised: 05/19/2018 Document Reviewed: 12/09/2016 Elsevier Patient Education  2020 Elsevier Inc.      Agustina Caroli, MD [facility]

## 2019-11-01 NOTE — Telephone Encounter (Signed)
Called and lmomed the pt to call us back and let us know why they stopped [id number] therapy

## 2019-11-01 NOTE — Patient Instructions (Addendum)
   If you have lab work done today you will be contacted with your lab results within the next 2 weeks.  If you have not heard from us then please contact us. The fastest way to get your results is to register for My Chart.   IF you received an x-ray today, you will receive an invoice from [facility]. Please contact [facility] at [phone] with questions or concerns regarding your invoice.   IF you received labwork today, you will receive an invoice from [facility]. Please contact [facility] at [phone] with questions or concerns regarding your invoice.   Our billing staff will not be able to assist you with questions regarding bills from these companies.  You will be contacted with the lab results as soon as they are available. The fastest way to get your results is to activate your My Chart account. Instructions are located on the last page of this paperwork. If you have not heard from us regarding the results in 2 weeks, please contact this office.     Health Maintenance After Age 65 After age 65, you are at a higher risk for certain long-term diseases and infections as well as injuries from falls. Falls are a major cause of broken bones and head injuries in people who are older than age 65. Getting regular preventive care can help to keep you healthy and well. Preventive care includes getting regular testing and making lifestyle changes as recommended by your health care provider. Talk with your health care provider about:  Which screenings and tests you should have. A screening is a test that checks for a disease when you have no symptoms.  A diet and exercise plan that is right for you. What should I know about screenings and tests to prevent falls? Screening and testing are the best ways to find a health problem early. Early diagnosis and treatment give you the best chance of managing medical conditions that are common after age 65. Certain conditions and  lifestyle choices may make you more likely to have a fall. Your health care provider may recommend:  Regular vision checks. Poor vision and conditions such as cataracts can make you more likely to have a fall. If you wear glasses, make sure to get your prescription updated if your vision changes.  Medicine review. Work with your health care provider to regularly review all of the medicines you are taking, including over-the-counter medicines. Ask your health care provider about any side effects that may make you more likely to have a fall. Tell your health care provider if any medicines that you take make you feel dizzy or sleepy.  Osteoporosis screening. Osteoporosis is a condition that causes the bones to get weaker. This can make the bones weak and cause them to break more easily.  Blood pressure screening. Blood pressure changes and medicines to control blood pressure can make you feel dizzy.  Strength and balance checks. Your health care provider may recommend certain tests to check your strength and balance while standing, walking, or changing positions.  Foot health exam. Foot pain and numbness, as well as not wearing proper footwear, can make you more likely to have a fall.  Depression screening. You may be more likely to have a fall if you have a fear of falling, feel emotionally low, or feel unable to do activities that you used to do.  Alcohol use screening. Using too much alcohol can affect your balance and may make you more likely to   have a fall. What actions can I take to lower my risk of falls? General instructions  Talk with your health care provider about your risks for falling. Tell your health care provider if: ? You fall. Be sure to tell your health care provider about all falls, even ones that seem minor. ? You feel dizzy, sleepy, or off-balance.  Take over-the-counter and prescription medicines only as told by your health care provider. These include any  supplements.  Eat a healthy diet and maintain a healthy weight. A healthy diet includes low-fat dairy products, low-fat (lean) meats, and fiber from whole grains, beans, and lots of fruits and vegetables. Home safety  Remove any tripping hazards, such as rugs, cords, and clutter.  Install safety equipment such as grab bars in bathrooms and safety rails on stairs.  Keep rooms and walkways well-lit. Activity   Follow a regular exercise program to stay fit. This will help you maintain your balance. Ask your health care provider what types of exercise are appropriate for you.  If you need a cane or walker, use it as recommended by your health care provider.  Wear supportive shoes that have nonskid soles. Lifestyle  Do not drink alcohol if your health care provider tells you not to drink.  If you drink alcohol, limit how much you have: ? 0-1 drink a day for women. ? 0-2 drinks a day for men.  Be aware of how much alcohol is in your drink. In the [place] one drink equals one typical bottle of beer (12 oz), one-half glass of wine (5 oz), or one shot of hard liquor (1 oz).  Do not use any products that contain nicotine or tobacco, such as cigarettes and e-cigarettes. If you need help quitting, ask your health care provider. Summary  Having a healthy lifestyle and getting preventive care can help to protect your health and wellness after age 65.  Screening and testing are the best way to find a health problem early and help you avoid having a fall. Early diagnosis and treatment give you the best chance for managing medical conditions that are more common for people who are older than age 65.  Falls are a major cause of broken bones and head injuries in people who are older than age 65. Take precautions to prevent a fall at home.  Work with your health care provider to learn what changes you can make to improve your health and wellness and to prevent falls. This information is not intended  to replace advice given to you by your health care provider. Make sure you discuss any questions you have with your health care provider. Document Revised: 05/19/2018 Document Reviewed: 12/09/2016 Elsevier Patient Education  2020 Elsevier Inc.  

## 2019-11-06 ENCOUNTER — Other Ambulatory Visit: Payer: Self-pay

## 2019-11-06 ENCOUNTER — Ambulatory Visit: Payer: Medicare HMO | Admitting: Pulmonary Disease

## 2019-11-06 ENCOUNTER — Encounter: Payer: Self-pay | Admitting: Pulmonary Disease

## 2019-11-06 ENCOUNTER — Ambulatory Visit (INDEPENDENT_AMBULATORY_CARE_PROVIDER_SITE_OTHER): Payer: Medicare HMO

## 2019-11-06 VITALS — BP 118/68 | HR 108 | Temp 98.0°F | Ht 63.0 in | Wt 149.4 lb

## 2019-11-06 DIAGNOSIS — Z8616 Personal history of COVID-19: Secondary | ICD-10-CM

## 2019-11-06 DIAGNOSIS — R0609 Other forms of dyspnea: Secondary | ICD-10-CM

## 2019-11-06 DIAGNOSIS — R0600 Dyspnea, unspecified: Secondary | ICD-10-CM | POA: Diagnosis not present

## 2019-11-06 DIAGNOSIS — R918 Other nonspecific abnormal finding of lung field: Secondary | ICD-10-CM | POA: Diagnosis not present

## 2019-11-06 DIAGNOSIS — J189 Pneumonia, unspecified organism: Secondary | ICD-10-CM | POA: Diagnosis not present

## 2019-11-06 DIAGNOSIS — J9811 Atelectasis: Secondary | ICD-10-CM | POA: Diagnosis not present

## 2019-11-06 NOTE — Progress Notes (Signed)
[facility]  Chief Complaint  Patient presents with  . Consult    pt states coughing sounds like croup.pt had cxr at pcp stated xray doesn't look good.pt states copd or bronchititis.pt states having pneumonia in agust    Constitutional:  BP 118/68 (BP Location: Left Arm, Cuff Size: Normal)   Pulse (!) 108   Temp 98 F (36.7 C) (Oral)   Ht 5\' 3"  (1.6 m)   Wt 149 lb 6.4 oz (67.8 kg)   SpO2 96%   BMI 26.47 kg/m   Past Medical History:  Anxiety, CAD s/p CABG, Diastolic CHF, CKD, Depression, Back pain, Diverticulitis, HTN, CVA, HLD, Hypothyroidism, OA, PAF, Tachy-brady s/p PM, PAD, COVID Pneumonia September 2021  Past Surgical History:  Her  has a past surgical history that includes Oophorectomy (~1979); Partial colectomy (2010); Carpal tunnel release (2008); Vaginal hysterectomy (1975); Posterior cervical laminectomy (1985); Peripheral arterial stent graft (2012; 2012); Facelift, lower 2/3 (1995); Lower Arterial Examination (10/28/2011); permanent pacemaker insertion (N/A, 01/21/2012); Cardiac catheterization (N/A, 01/01/2015); Coronary artery bypass graft (N/A, 01/04/2015); TEE without cardioversion (N/A, 01/04/2015); Clipping of atrial appendage (N/A, 01/04/2015); Cardioversion (N/A, 02/01/2015); TEE without cardioversion (N/A, 02/01/2015); Cardiac catheterization (N/A, 05/12/2015); Cardiac catheterization (N/A, 05/12/2015); TEE without cardioversion (N/A, 08/30/2015); Cardioversion (N/A, 08/30/2015); Esophagogastroduodenoscopy (egd) with propofol (N/A, 12/18/2015); Colonoscopy (N/A, 12/21/2015); Givens capsule study (N/A, 12/21/2015); Lower Extremity Angiography (N/A, 02/15/2017); ABDOMINAL AORTOGRAM W/LOWER EXTREMITY (Bilateral, 02/15/2017); PERIPHERAL VASCULAR BALLOON ANGIOPLASTY (Right, 02/15/2017); Ablation of typical atrial flutter (CTI line) (09/05/2016); Parathyroidectomy (N/A, 05/13/2017); Breast implant removal (Bilateral, 01/14/2018); Capsulectomy  (Bilateral, 01/14/2018); ABDOMINAL AORTOGRAM W/LOWER EXTREMITY (N/A, 10/06/2018); PERIPHERAL VASCULAR INTERVENTION (Right, 10/06/2018); and Augmentation mammaplasty (1983).  Brief Summary:  [name] is a 79 y.o. female former smoker with cough.       Subjective:   She has noticed a cough for about 1.5 years.  She went to the ER in July 2021.  She was told by the ER staff that her chest xray showed "COPD" and she was told to see a lung doctor.  She was hospitalized in September 2021 with COVID 19 pneumonia.  She was treated with dexamethasone and remdesivir.  Her cough seems to have improved after she was treated with steroids for COVID.  She has an albuterol inhaler and this helps.  She hasn't need to use this as much over the past week.  She was discharged home with supplemental oxygen.  She uses 1 liter with rest and as needed with exertion.  She denies allergies or asthma.  Not having sputum or chest pain.  No skin rash or leg swelling.  Still gets winded easily and feels fatigued.  She had pneumonia years ago.  She worked as a Insurance underwriter.  Quit smoking 25 yrs ago.  No family history of lung disease.  Physical Exam:   Appearance - well kempt   ENMT - no sinus tenderness, no oral exudate, no LAN, Mallampati 2 airway, no stridor  Respiratory - bronchial breath sounds b/l more in upper lobes  CV - s1s2 regular rate and rhythm, no murmurs  Ext - no clubbing, no edema  Skin - no rashes  Psych - normal mood and affect   Pulmonary testing:   PFT 01/02/15 >> FEV1 1.68 (81%), FEV1% 86, TLC 4.36 (87%), DLCO 67%  Chest Imaging:   CT angio chest 10/10/19 >> multifocal GGO  Cardiac Tests:   Echo 08/10/19 >> EF 60 to 65%, grade 2 DD, RVSP 36.3 mmHg, mild  RA/LA dilation, mild MR, mod TR, mild AR  Social History:  She  reports that she quit smoking about 13 years ago. Her smoking use included cigarettes. She has a 7.50 pack-year smoking history. She has never used smokeless  tobacco. She reports current alcohol use. She reports that she does not use drugs.  Family History:  Her family history includes CVA in her brother; Heart attack in her brother; Heart disease in her brother, father, and mother; Hypertension in her brother and mother; Stroke in her father and mother.    Discussion:  She had cough that seems to have improved after she was treated with steroids for COVID 19 pneumonia.  Even though she has a remote history of smoking, her previous PFT didn't show evidence for obstructive lung disease.  Her main issue currently seems to be dyspnea on exertion and fatigue after recent admission for COVID 19 pneumonia.  Assessment/Plan:   Dyspnea, fatigue, cough with COVID 19 pneumonia in September 2021. - repeat chest xray today - will need repeat PFT when she has further recovered from COVID infection - prn albuterol - advised her to f/u with her PCP about getting influenza vaccination - she plans to get COVID vaccine once she has recovered further from COVID infection  Chronic hypoxic respiratory failure. - in setting of COVID 19 pneumonia - she maintained her SpO2 above 95% on room air walking on 11/06/19 - can continue supplemental oxygen at night for now at 1 liter and will reassess at next visit   Time Spent Involved in Patient Care on Day of Examination:  46 minutes  Follow up:  Patient Instructions  Chest xray today  Follow up in 3 weeks with Dr. Halford Chessman or Nurse Practitioner   Medication List:   Allergies as of 11/06/2019      Reactions   Brilinta [ticagrelor] Shortness Of Breath   Clonidine Derivatives Other (See Comments)   Lowers heart rate   Atorvastatin Other (See Comments)   Possible cause of fatigue/malaise   Crestor [rosuvastatin Calcium] Other (See Comments)   Joint/muscle aches   Exforge [amlodipine Besylate-valsartan] Itching, Rash   Spironolactone Other (See Comments)   Contraindicated with history of hyperkalemia        Medication List       Accurate as of November 06, 2019  2:16 PM. If you have any questions, ask your nurse or doctor.        STOP taking these medications   budesonide-formoterol 80-4.5 MCG/ACT inhaler Commonly known as: SYMBICORT Stopped by: Chesley Mires, MD   NIACIN PO Stopped by: Chesley Mires, MD   ondansetron 4 MG tablet Commonly known as: ZOFRAN Stopped by: Chesley Mires, MD     TAKE these medications   albuterol 108 (90 Base) MCG/ACT inhaler Commonly known as: VENTOLIN HFA Inhale 1-2 puffs into the lungs every 6 (six) hours as needed for wheezing or shortness of breath.   amLODipine 5 MG tablet Commonly known as: NORVASC TAKE 1.5 TABLETS (7.5 MG TOTAL) BY MOUTH DAILY. What changed: See the new instructions.   aspirin EC 81 MG tablet Take 81 mg by mouth daily.   citalopram 40 MG tablet Commonly known as: CELEXA TAKE 1 TABLET BY MOUTH EVERY DAY   clopidogrel 75 MG tablet Commonly known as: PLAVIX TAKE 1 TABLET (75 MG TOTAL) BY MOUTH DAILY WITH BREAKFAST.   dofetilide 250 MCG capsule Commonly known as: TIKOSYN Take 1 capsule (250 mcg total) by mouth 2 (two) times daily.   furosemide 40  MG tablet Commonly known as: LASIX Take 40 mg by mouth daily as needed for fluid or edema.   levothyroxine 25 MCG tablet Commonly known as: SYNTHROID Take 25 mcg by mouth daily before breakfast.   Multi-Vite Liqd Take 1 tablet by mouth daily.   nitroGLYCERIN 0.4 MG SL tablet Commonly known as: NITROSTAT Place 1 tablet (0.4 mg total) under the tongue every 5 (five) minutes x 3 doses as needed for chest pain.   OMEGA 3 PO Take 690 mg by mouth daily.   TURMERIC PO Take 475 mg by mouth daily.   vitamin C 500 MG tablet Commonly known as: ASCORBIC ACID Take 500 mg by mouth daily.   VITAMIN D3 PO Take 1 tablet by mouth daily.       Signature:  Chesley Mires, MD [facility] [place] Pager - [phone] 11/06/2019, 2:16 PM

## 2019-11-06 NOTE — Patient Instructions (Signed)
Chest xray today  Follow up in 3 weeks with Dr. Halford Chessman or Nurse Practitioner

## 2019-11-06 NOTE — Progress Notes (Signed)
Thanks for follow up, Vineet.

## 2019-11-07 ENCOUNTER — Telehealth: Payer: Self-pay | Admitting: Pulmonary Disease

## 2019-11-07 DIAGNOSIS — Z8616 Personal history of COVID-19: Secondary | ICD-10-CM

## 2019-11-07 NOTE — Telephone Encounter (Signed)
DG Chest 2 View  Result Date: 11/07/2019 CLINICAL DATA:  Disc on exertion, history of COVID pneumonia EXAM: CHEST - 2 VIEW COMPARISON:  10/10/2019 FINDINGS: LEFT subclavian sequential transvenous pacemaker leads project at RIGHT atrium and RIGHT ventricle. Normal heart size post CABG and LEFT atrial appendage clipping. Mild tortuosity of thoracic aorta. Peribronchial thickening with slightly increased infiltrate in RIGHT upper lobe since previous exam. Peripheral opacity at lateral lower RIGHT lung likely atelectasis versus scarring. Subsegmental atelectasis LEFT upper lobe. No pleural effusion or pneumothorax. Osseous structures unremarkable. IMPRESSION: Increased RIGHT upper lobe infiltrate with question atelectasis versus scarring in lateral lower RIGHT lung. Electronically Signed   By: Lavonia Dana M.D.   On: 11/07/2019 08:22     Please let her know her CXR shows persistent scarring from recent COVID 19 pneumonia infection.  Please schedule High Resolution CT chest to assess for ILD in the setting of COVID 19 pneumonia.

## 2019-11-07 NOTE — Telephone Encounter (Signed)
Called and spoke with patient about xray results per Dr Halford Chessman. All questions answered and order placed for High resolution CT chest per Dr Halford Chessman. Patient expressed understanding of results and order being placed. Confirmed with patient follow up appointment already scheduled on 11/27/2019 with NP. Nothing further needed at this time.

## 2019-11-08 DIAGNOSIS — R279 Unspecified lack of coordination: Secondary | ICD-10-CM | POA: Diagnosis not present

## 2019-11-08 DIAGNOSIS — Z6826 Body mass index (BMI) 26.0-26.9, adult: Secondary | ICD-10-CM | POA: Diagnosis not present

## 2019-11-08 DIAGNOSIS — I10 Essential (primary) hypertension: Secondary | ICD-10-CM | POA: Diagnosis not present

## 2019-11-13 ENCOUNTER — Ambulatory Visit (INDEPENDENT_AMBULATORY_CARE_PROVIDER_SITE_OTHER): Payer: Medicare HMO

## 2019-11-13 DIAGNOSIS — I495 Sick sinus syndrome: Secondary | ICD-10-CM

## 2019-11-13 NOTE — Telephone Encounter (Signed)
Email sent from patient  I'm scheduled for a Ct scan but haven't heard of a date for appointment; could this CT scan be scheduled parallel to another one that I have scheduled at [facility] on October 7 11:00 ?  This scan was scheduled by [facility] for a neck and brain scan.   PCC's can you please advise and call patient.

## 2019-11-15 LAB — CUP PACEART REMOTE DEVICE CHECK
Battery Impedance: 878 Ohm
Battery Remaining Longevity: 60 mo
Battery Voltage: 2.77 V
Brady Statistic AP VP Percent: 1 %
Brady Statistic AP VS Percent: 97 %
Brady Statistic AS VP Percent: 0 %
Brady Statistic AS VS Percent: 2 %
Date Time Interrogation Session: 20211005224559
Implantable Lead Implant Date: 20131212
Implantable Lead Implant Date: 20131212
Implantable Lead Location: 753859
Implantable Lead Location: 753860
Implantable Lead Model: 5076
Implantable Lead Model: 5076
Implantable Pulse Generator Implant Date: 20131212
Lead Channel Impedance Value: 388 Ohm
Lead Channel Impedance Value: 462 Ohm
Lead Channel Pacing Threshold Amplitude: 0.75 V
Lead Channel Pacing Threshold Amplitude: 0.875 V
Lead Channel Pacing Threshold Pulse Width: 0.4 ms
Lead Channel Pacing Threshold Pulse Width: 0.4 ms
Lead Channel Setting Pacing Amplitude: 2 V
Lead Channel Setting Pacing Amplitude: 2.5 V
Lead Channel Setting Pacing Pulse Width: 0.4 ms
Lead Channel Setting Sensing Sensitivity: 4 mV

## 2019-11-15 NOTE — Progress Notes (Signed)
Remote pacemaker transmission.   

## 2019-11-16 ENCOUNTER — Ambulatory Visit (HOSPITAL_COMMUNITY)
Admission: RE | Admit: 2019-11-16 | Discharge: 2019-11-16 | Disposition: A | Discharge status: Home / Self Care | Payer: Medicare HMO | Source: Ambulatory Visit | Attending: Pulmonary Disease | Admitting: Pulmonary Disease

## 2019-11-16 ENCOUNTER — Other Ambulatory Visit: Payer: Self-pay

## 2019-11-16 DIAGNOSIS — R0902 Hypoxemia: Secondary | ICD-10-CM | POA: Diagnosis not present

## 2019-11-16 DIAGNOSIS — I77810 Thoracic aortic ectasia: Secondary | ICD-10-CM | POA: Diagnosis not present

## 2019-11-16 DIAGNOSIS — J849 Interstitial pulmonary disease, unspecified: Secondary | ICD-10-CM | POA: Insufficient documentation

## 2019-11-16 DIAGNOSIS — R0600 Dyspnea, unspecified: Secondary | ICD-10-CM | POA: Diagnosis not present

## 2019-11-16 DIAGNOSIS — I700 Atherosclerosis of aorta: Secondary | ICD-10-CM | POA: Diagnosis not present

## 2019-11-16 DIAGNOSIS — Z8616 Personal history of COVID-19: Secondary | ICD-10-CM

## 2019-11-16 DIAGNOSIS — U099 Post covid-19 condition, unspecified: Secondary | ICD-10-CM | POA: Insufficient documentation

## 2019-11-21 ENCOUNTER — Telehealth: Payer: Self-pay | Admitting: Pulmonary Disease

## 2019-11-21 NOTE — Telephone Encounter (Signed)
Dr. Halford Chessman please advise on CT done on 11/16/19, let patient know when we heard back we would let her know his recommendations.

## 2019-11-22 ENCOUNTER — Other Ambulatory Visit: Payer: Self-pay

## 2019-11-22 ENCOUNTER — Ambulatory Visit (INDEPENDENT_AMBULATORY_CARE_PROVIDER_SITE_OTHER): Payer: Medicare HMO | Admitting: Emergency Medicine

## 2019-11-22 ENCOUNTER — Encounter: Payer: Self-pay | Admitting: Emergency Medicine

## 2019-11-22 VITALS — BP 160/69 | HR 82 | Temp 98.3°F | Resp 16 | Ht 64.0 in | Wt 151.0 lb

## 2019-11-22 DIAGNOSIS — G933 Postviral fatigue syndrome: Secondary | ICD-10-CM

## 2019-11-22 DIAGNOSIS — F3342 Major depressive disorder, recurrent, in full remission: Secondary | ICD-10-CM

## 2019-11-22 DIAGNOSIS — Z789 Other specified health status: Secondary | ICD-10-CM | POA: Insufficient documentation

## 2019-11-22 DIAGNOSIS — I130 Hypertensive heart and chronic kidney disease with heart failure and stage 1 through stage 4 chronic kidney disease, or unspecified chronic kidney disease: Secondary | ICD-10-CM | POA: Diagnosis not present

## 2019-11-22 DIAGNOSIS — I495 Sick sinus syndrome: Secondary | ICD-10-CM | POA: Diagnosis not present

## 2019-11-22 DIAGNOSIS — J1282 Pneumonia due to coronavirus disease 2019: Secondary | ICD-10-CM | POA: Diagnosis not present

## 2019-11-22 DIAGNOSIS — U071 COVID-19: Secondary | ICD-10-CM

## 2019-11-22 DIAGNOSIS — R69 Illness, unspecified: Secondary | ICD-10-CM | POA: Diagnosis not present

## 2019-11-22 DIAGNOSIS — I700 Atherosclerosis of aorta: Secondary | ICD-10-CM

## 2019-11-22 DIAGNOSIS — G9331 Postviral fatigue syndrome: Secondary | ICD-10-CM

## 2019-11-22 MED ORDER — ROSUVASTATIN CALCIUM 10 MG PO TABS: 10.0000 mg | ORAL_TABLET | Freq: Every day | ORAL | 3 refills | Status: DC

## 2019-11-22 NOTE — Progress Notes (Signed)
[name] 79 y.o.   Chief Complaint  Patient presents with  . Pneumonia    follow up    HISTORY OF PRESENT ILLNESS: This is a 79 y.o. female here for follow-up of pneumonia secondary to Covid on 10/10/2019. Seen by me 4 weeks ago for hospital discharge follow-up.  Here for 4-week follow-up of my last visit. Continues to improve.  Still has some dyspnea on exertion.  No new symptomatology. Recent CT of the chest reviewed and discussed with patient: DG Chest 2 View  Result Date: 11/07/2019 CLINICAL DATA:  Disc on exertion, history of COVID pneumonia EXAM: CHEST - 2 VIEW COMPARISON:  10/10/2019 FINDINGS: LEFT subclavian sequential transvenous pacemaker leads project at RIGHT atrium and RIGHT ventricle. Normal heart size post CABG and LEFT atrial appendage clipping. Mild tortuosity of thoracic aorta. Peribronchial thickening with slightly increased infiltrate in RIGHT upper lobe since previous exam. Peripheral opacity at lateral lower RIGHT lung likely atelectasis versus scarring. Subsegmental atelectasis LEFT upper lobe. No pleural effusion or pneumothorax. Osseous structures unremarkable. IMPRESSION: Increased RIGHT upper lobe infiltrate with question atelectasis versus scarring in lateral lower RIGHT lung. Electronically Signed   By: Lavonia Dana M.D.   On: 11/07/2019 08:22   CT Chest High Resolution  Result Date: 11/16/2019 CLINICAL DATA:  79 year old female with history of COVID-19 pneumonia. Evaluate for interstitial lung disease. EXAM: CT CHEST WITHOUT CONTRAST TECHNIQUE: Multidetector CT imaging of the chest was performed following the standard protocol without intravenous contrast. High resolution imaging of the lungs, as well as inspiratory and expiratory imaging, was performed. COMPARISON:  Chest CTA 10/10/2019. FINDINGS: Cardiovascular: Heart size is normal. There is no significant pericardial fluid, thickening or pericardial calcification. There is aortic atherosclerosis, as well  as atherosclerosis of the great vessels of the mediastinum and the coronary arteries, including calcified atherosclerotic plaque in the left main, left anterior descending, left circumflex and right coronary arteries. Ectasia of ascending thoracic aorta (4.1 cm in diameter). Status post median sternotomy for CABG including LIMA to the LAD. Left-sided pacemaker/AICD with lead tips terminating in the right atrium and right ventricular apex. Mediastinum/Nodes: No pathologically enlarged mediastinal or hilar lymph nodes. Please note that accurate exclusion of hilar adenopathy is limited on noncontrast CT scans. Esophagus is unremarkable in appearance. No axillary lymphadenopathy. Lungs/Pleura: When compared to the prior examination from 10/10/2019 the patchy areas of ground-glass attenuation and airspace consolidation seen on the prior examination have significantly regressed, with areas of less severe ground-glass attenuation and extensive septal thickening and regional architectural distortion in their wake. Inspiratory and expiratory imaging demonstrates some mild air trapping indicative of small airways disease. No acute consolidative airspace disease. No pleural effusions. Upper Abdomen: 1.7 x 1.3 cm low-intermediate attenuation (19 HU) right adrenal nodule, stable compared to prior examinations, favored to represent a benign lesions such as a lipid poor adenoma. Aortic atherosclerosis. Musculoskeletal: Median sternotomy wires. Multiple old healed posterolateral left rib fractures. There are no aggressive appearing lytic or blastic lesions noted in the visualized portions of the skeleton. IMPRESSION: 1. The appearance of the lungs is most compatible with evolving post infectious or inflammatory fibrosis with a cryptogenic organizing pneumonia (COP) pattern, categorized as most compatible with an alternative diagnosis to usual interstitial pneumonia (UIP) per current ATS guidelines. 2. Aortic atherosclerosis, in  addition to left main and 3 vessel coronary artery disease. Status post median sternotomy for CABG including LIMA to the LAD. 3. Ectasia of ascending thoracic aorta (4.1 cm in diameter). Recommend annual imaging followup  by CTA or MRA. This recommendation follows 2010 ACCF/AHA/AATS/ACR/ASA/SCA/SCAI/SIR/STS/SVM Guidelines for the Diagnosis and Management of Patients with Thoracic Aortic Disease. Circulation. 2010; 121: X735-H299. Aortic aneurysm NOS (ICD10-I71.9). Aortic Atherosclerosis (ICD10-I70.0). Electronically Signed   By: Vinnie Langton M.D.   On: 11/16/2019 14:11   CUP PACEART REMOTE DEVICE CHECK  Result Date: 11/15/2019 Scheduled remote reviewed. Normal device function.  Longest AMS 7 mins Longest VHR 2 mins appear likely AT w/ some undersensing Next remote 91 days. JM  Has no complaints or significant medical concerns today. Saw pulmonary doctor recently and is scheduled to see neurosurgeon next week. CT of the chest showed aortic atherosclerosis.  Patient did develop some muscle achiness and joint pains while on statins in the past but is willing to try a different statin again.  HPI   Prior to Admission medications   Medication Sig Start Date End Date Taking? Authorizing Provider  albuterol (VENTOLIN HFA) 108 (90 Base) MCG/ACT inhaler Inhale 1-2 puffs into the lungs every 6 (six) hours as needed for wheezing or shortness of breath. 09/05/19   Darr, Marguerita Beards, PA-C  amLODipine (NORVASC) 5 MG tablet TAKE 1.5 TABLETS (7.5 MG TOTAL) BY MOUTH DAILY. Patient taking differently: Take 7.5 mg by mouth daily.  08/30/19   Lorretta Harp, MD  aspirin EC 81 MG tablet Take 81 mg by mouth daily.     [provider]  Cholecalciferol (VITAMIN D3 PO) Take 1 tablet by mouth daily.    [provider]  citalopram (CELEXA) 40 MG tablet TAKE 1 TABLET BY MOUTH EVERY DAY 10/09/19   Horald Pollen, MD  clopidogrel (PLAVIX) 75 MG tablet TAKE 1 TABLET (75 MG TOTAL) BY MOUTH DAILY WITH  BREAKFAST. 10/09/19   Lorretta Harp, MD  dofetilide (TIKOSYN) 250 MCG capsule Take 1 capsule (250 mcg total) by mouth 2 (two) times daily. 04/06/19   Croitoru, Mihai, MD  furosemide (LASIX) 40 MG tablet Take 40 mg by mouth daily as needed for fluid or edema.     [provider]  levothyroxine (SYNTHROID, LEVOTHROID) 25 MCG tablet Take 25 mcg by mouth daily before breakfast.  04/03/16   [provider]  Multiple Vitamins-Minerals (MULTI-VITE) LIQD Take 1 tablet by mouth daily.     [provider]  nitroGLYCERIN (NITROSTAT) 0.4 MG SL tablet Place 1 tablet (0.4 mg total) under the tongue every 5 (five) minutes x 3 doses as needed for chest pain. 05/15/15   Lyda Jester M, PA-C  Omega-3 Fatty Acids (OMEGA 3 PO) Take 690 mg by mouth daily.    [provider]  TURMERIC PO Take 475 mg by mouth daily.    [provider]  vitamin C (ASCORBIC ACID) 500 MG tablet Take 500 mg by mouth daily.    [provider]    Allergies  Allergen Reactions  . Brilinta [Ticagrelor] Shortness Of Breath  . Clonidine Derivatives Other (See Comments)    Lowers heart rate  . Atorvastatin Other (See Comments)    Possible cause of fatigue/malaise  . Crestor [Rosuvastatin Calcium] Other (See Comments)    Joint/muscle aches  . Exforge [Amlodipine Besylate-Valsartan] Itching and Rash  . Spironolactone Other (See Comments)    Contraindicated with history of hyperkalemia    Patient Active Problem List   Diagnosis Date Noted  . Statin intolerance 11/22/2019  . Acute respiratory failure due to COVID-19 ([place]) 10/10/2019  . Hyponatremia 10/10/2019  . Hypothyroidism (acquired) 02/08/2018  . Coronary artery disease involving coronary bypass graft  of native heart without angina pectoris 10/22/2017  . Dyslipidemia 10/22/2017  . Cerebrovascular small vessel disease 04/27/2017  . Gait abnormality 03/25/2017  . Cerebrovascular disease 03/25/2017  . Claudication in  peripheral vascular disease ([place]) 02/15/2017  . Hyperparathyroidism ([place]) 05/13/2016  . Pacemaker 01/10/2016  . Benign neoplasm of ascending colon   . Long term current use of anticoagulant   . CKD (chronic kidney disease), stage II   . H/O amiodarone therapy 12/04/2015  . Chronic diastolic heart failure ([place]) 10/19/2015  . Atypical atrial flutter ([place])   . Paroxysmal atrial flutter ([place]) 08/23/2015  . Chronic fatigue 06/27/2015  . Stented coronary artery   . S/P CABG x 3 01/04/15 01/10/2015  . CAD (coronary artery disease) 01/04/2015  . PAF (paroxysmal atrial fibrillation) ([place]) 01/22/2012  . Sinus node dysfunction ([place]) 01/22/2012  . S/P placement of cardiac pacemaker, medtronic adapta 01/21/12 01/22/2012  . PVD (peripheral vascular disease), hx stents to bil SFAs 02/2010 01/22/2012  . Hyperlipidemia 12/31/2009  . Anxiety 12/31/2009  . DEPRESSION 12/31/2009  . Essential hypertension 12/31/2009  . Coronary atherosclerosis 12/31/2009  . OSTEOARTHRITIS, HAND 12/31/2009  . [place] DISEASE, CERVICAL 12/31/2009  . [place] DISEASE, LUMBAR 12/31/2009  . UNSPECIFIED URINARY INCONTINENCE 12/31/2009    Past Medical History:  Diagnosis Date  . ANXIETY 12/31/2009  . Aortic insufficiency    a. mod by echo 2017.  [place] CAD (coronary artery disease)    a. s/p CABGx3 and LAA clipping in 12/2014, NSTEMI 05/2015 s/p DES to native RCA and SVG-dRCA; occluded ramus-SVG was treated medically). c. neg nuc 10/2015 at [facility].  . Chronic diastolic CHF (congestive heart failure) ([place])   . Chronic fatigue   . CKD (chronic kidney disease), stage II   . DEPRESSION 12/31/2009  . [place] DISEASE, CERVICAL 12/31/2009  . [place] DISEASE, LUMBAR 12/31/2009  . DIVERTICULITIS, HX OF 12/31/2009  . Essential hypertension   . Gait abnormality 03/25/2017  . GLUCOSE INTOLERANCE 12/31/2009  . Habitual alcohol use   . History of stroke    self reports " they say i have had some pin strokes"   . Hypercalcemia   . Hyperkalemia   .  Hyperlipidemia 12/31/2009  . Hypothyroidism   . MENOPAUSE, EARLY 12/31/2009  . NSTEMI (non-ST elevated myocardial infarction) ([place]) 05/11/2015  . Orthostatic hypotension   . OSTEOARTHRITIS, HAND   . Pacemaker 01/21/2012   MDT Adapta dual chamber  . Paroxysmal atrial flutter ([place])   . Persistent atrial fibrillation ([place])   . PVD (peripheral vascular disease), hx stents to bil SFAs 02/2010   . Rash, skin     superficial raised red pencil point sized rash bilateral forearms; states " it started when i started the Plavix "   . Ruptured left breast implant   . Symptomatic sinus bradycardia 01/22/2012  . Syncope    a. 11/2015 ? due to medications.  . Syncope    reports on 05-10-17 " i passed out 2 months ago in the bathroom and broke some ribs"    . Tachy-brady syndrome ([place])    a. s/p MDT PPM 2013.  . Tricuspid regurgitation     Past Surgical History:  Procedure Laterality Date  . ABDOMINAL AORTOGRAM W/LOWER EXTREMITY Bilateral 02/15/2017   Procedure: ABDOMINAL AORTOGRAM W/LOWER EXTREMITY;  Surgeon: Lorretta Harp, MD;  Location: [place] [facility];  Service: Cardiovascular;  Laterality: Bilateral;  bilat  . ABDOMINAL AORTOGRAM W/LOWER EXTREMITY N/A 10/06/2018   Procedure: ABDOMINAL AORTOGRAM W/LOWER EXTREMITY;  Surgeon: Lorretta Harp, MD;  Location: [place] [facility];  Service: Cardiovascular;  Laterality: N/A;  . Ablation of typical atrial flutter (CTI line)  09/05/2016   Dr Antonieta Pert at [facility]   . [place]   Removed 01/2018  . BREAST IMPLANT REMOVAL Bilateral 01/14/2018   Procedure: REMOVAL BILATERAL BREAST IMPLANTS;  Surgeon: Irene Limbo, MD;  Location: [place];  Service: Plastics;  Laterality: Bilateral;  . CAPSULECTOMY Bilateral 01/14/2018   Procedure: CAPSULECTOMY;  Surgeon: Irene Limbo, MD;  Location: [place];  Service: Plastics;  Laterality: Bilateral;  . CARDIAC CATHETERIZATION N/A 01/01/2015   Procedure:  Left Heart Cath and Coronary Angiography;  Surgeon: Jettie Booze, MD;  Location: [place] [facility];  Service: Cardiovascular;  Laterality: N/A;  . CARDIAC CATHETERIZATION N/A 05/12/2015   Procedure: Left Heart Cath and Coronary Angiography;  Surgeon: Troy Sine, MD;  Location: [place] [facility];  Service: Cardiovascular;  Laterality: N/A;  . CARDIAC CATHETERIZATION N/A 05/12/2015   Procedure: Coronary Stent Intervention;  Surgeon: Troy Sine, MD;  Location: [place] [facility];  Service: Cardiovascular;  Laterality: N/A;  . CARDIOVERSION N/A 02/01/2015   Procedure: CARDIOVERSION;  Surgeon: Lelon Perla, MD;  Location: [facility];  Service: Cardiovascular;  Laterality: N/A;  . CARDIOVERSION N/A 08/30/2015   Procedure: CARDIOVERSION;  Surgeon: Sanda Klein, MD;  Location: [place] ENDOSCOPY;  Service: Cardiovascular;  Laterality: N/A;  . CARPAL TUNNEL RELEASE  2008   "right hand/thumb; carpal tunnel repair; got rid of arthritis" (01/21/2012)  . CLIPPING OF ATRIAL APPENDAGE N/A 01/04/2015   Procedure: CLIPPING OF ATRIAL APPENDAGE;  Surgeon: Grace Isaac, MD;  Location: [place];  Service: Open Heart Surgery;  Laterality: N/A;  . COLONOSCOPY N/A 12/21/2015   Procedure: COLONOSCOPY;  Surgeon: Milus Banister, MD;  Location: [facility] ENDOSCOPY;  Service: Endoscopy;  Laterality: N/A;  . CORONARY ARTERY BYPASS GRAFT N/A 01/04/2015   Procedure: CORONARY ARTERY BYPASS GRAFTING (CABG) x 3 using left internal mammory artery and greater saphenous vein right leg harvested endoscopically.;  Surgeon: Grace Isaac, MD;  LIMA-LAD, SVG-RI, SVG-PDA  . ESOPHAGOGASTRODUODENOSCOPY (EGD) WITH PROPOFOL N/A 12/18/2015   Procedure: ESOPHAGOGASTRODUODENOSCOPY (EGD) WITH PROPOFOL;  Surgeon: Milus Banister, MD;  Location: [facility];  Service: Endoscopy;  Laterality: N/A;  . FACELIFT, LOWER 2/3  1995   "mini" (01/21/2012)  . GIVENS CAPSULE STUDY N/A 12/21/2015   Procedure: GIVENS CAPSULE STUDY;  Surgeon:  Milus Banister, MD;  Location: [facility];  Service: Endoscopy;  Laterality: N/A;  . Lower Arterial Examination  10/28/2011   R. SFA stent mild-moderate mixed density plaque with elevated velocities consistent with 50% diameter reduction. L. SFA stent moderate mixed denisty plaque at mid to distal level consistent with 50-69% diameter reduction.  . LOWER EXTREMITY ANGIOGRAPHY N/A 02/15/2017   Procedure: LOWER EXTREMITY ANGIOGRAPHY;  Surgeon: Lorretta Harp, MD;  Location: [place] [facility];  Service: Cardiovascular;  Laterality: N/A;  . OOPHORECTOMY  ~1979  . PARATHYROIDECTOMY N/A 05/13/2017   Procedure: PARATHYROIDECTOMY;  Surgeon: Armandina Gemma, MD;  Location: [facility];  Service: General;  Laterality: N/A;  . PARTIAL COLECTOMY  2010  . PERIPHERAL ARTERIAL STENT GRAFT  2012; 2012   "LLE; RLE" (01/21/2012)  . PERIPHERAL VASCULAR BALLOON ANGIOPLASTY Right 02/15/2017   Procedure: PERIPHERAL VASCULAR BALLOON ANGIOPLASTY;  Surgeon: Lorretta Harp, MD;  Location: [place] [facility];  Service: Cardiovascular;  Laterality: Right;  SFA    . PERIPHERAL VASCULAR INTERVENTION Right 10/06/2018  Procedure: PERIPHERAL VASCULAR INTERVENTION;  Surgeon: Lorretta Harp, MD;  Location: [place] [facility];  Service: Cardiovascular;  Laterality: Right;  . PERMANENT PACEMAKER INSERTION N/A 01/21/2012   Medtronic Adapta L implanted by Dr Sallyanne Kuster for tachy/brady syndrome  . POSTERIOR CERVICAL LAMINECTOMY  1985  . TEE WITHOUT CARDIOVERSION N/A 01/04/2015   Procedure: TRANSESOPHAGEAL ECHOCARDIOGRAM (TEE);  Surgeon: Grace Isaac, MD;  Location: [place];  Service: Open Heart Surgery;  Laterality: N/A;  . TEE WITHOUT CARDIOVERSION N/A 02/01/2015   Procedure: TRANSESOPHAGEAL ECHOCARDIOGRAM (TEE);  Surgeon: Lelon Perla, MD;  Location[facility] ENDOSCOPY;  Service: Cardiovascular;  Laterality: N/A;  . TEE WITHOUT CARDIOVERSION N/A 08/30/2015   Procedure: TRANSESOPHAGEAL ECHOCARDIOGRAM (TEE);  Surgeon: Sanda Klein,  MD;  Location: [facility] ENDOSCOPY;  Service: Cardiovascular;  Laterality: N/A;  . [place] History   Socioeconomic History  . Marital status: Widowed    Spouse name: Not on file  . Number of children: 3  . Years of education: Not on file  . Highest education level: Not on file  Occupational History  . 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 Smoking status: Former Smoker    Packs/day: 0.75    Years: 10.00    Pack years: 7.50    Types: Cigarettes    Quit date: 2008    Years since quitting: 13.7  . Smokeless tobacco: Never Used  . Tobacco comment: 01/21/2012 "quit smoking ~ 2002"  Vaping Use  . Vaping Use: Never used  Substance and Sexual Activity  . Alcohol use: Yes    Comment: daily coctail  . Drug use: No  . Sexual activity: Not Currently  Other Topics Concern  . Not on file  Social History Narrative   Lives in [place].  Retired.   Social Determinants of Health   Financial Resource Strain:   . Difficulty of Paying Living Expenses: Not on file  Food Insecurity:   . Worried About Charity fundraiser in the Last Year: Not on file  . Ran Out of Food in the Last Year: Not on file  Transportation Needs:   . Lack of Transportation (Medical): Not on file  . Lack of Transportation (Non-Medical): Not on file  Physical Activity:   . Days of Exercise per Week: Not on file  . Minutes of Exercise per Session: Not on file  Stress:   . Feeling of Stress : Not on file  Social Connections:   . Frequency of Communication with Friends and Family: Not on file  . Frequency of Social Gatherings with Friends and Family: Not on file  . Attends Religious Services: Not on file  . Active Member of Clubs or Organizations: Not on file  . Attends Archivist Meetings: Not on file  . Marital Status: Not on file  Intimate Partner Violence:   . Fear of Current or Ex-Partner: Not on file  . Emotionally Abused: Not on file    . Physically Abused: Not on file  . Sexually Abused: Not on file    Family History  Problem Relation Age of Onset  . Heart disease Mother   . Hypertension Mother   . Stroke Mother   . Stroke Father   . Heart disease Father   . Heart disease Brother   . Hypertension Brother        2 brothers  . CVA Brother        2 brothers  .  Heart attack Brother        2 brothers   Depression screen [facility] 2/9 11/22/2019 11/01/2019 10/10/2019 10/05/2019 09/14/2019  Decreased Interest 0 0 0 0 0  Down, Depressed, Hopeless 0 1 0 0 0  PHQ - 2 Score 0 1 0 0 0  Altered sleeping - - - - -  Tired, decreased energy - - - - -  Change in appetite - - - - -  Feeling bad or failure about yourself  - - - - -  Trouble concentrating - - - - -  Moving slowly or fidgety/restless - - - - -  Suicidal thoughts - - - - -  PHQ-9 Score - - - - -  Difficult doing work/chores - - - - -  Some recent data might be hidden     Review of Systems  Constitutional: Negative.  Negative for chills and fever.  HENT: Negative.  Negative for congestion and sore throat.   Respiratory: Negative.  Negative for cough and shortness of breath.   Cardiovascular: Negative.  Negative for chest pain and palpitations.  Gastrointestinal: Negative.  Negative for abdominal pain, diarrhea, nausea and vomiting.  Genitourinary: Negative.  Negative for dysuria and hematuria.  Skin: Negative.  Negative for rash.  Neurological: Negative.  Negative for dizziness and headaches.  All other systems reviewed and are negative.  Today's Vitals   11/22/19 1440  BP: (!) 160/69  Pulse: 82  Resp: 16  Temp: 98.3 F (36.8 C)  TempSrc: Temporal  SpO2: 96%  Weight: 151 lb (68.5 kg)  Height: 5\' 4"  (1.626 m)   Body mass index is 25.92 kg/m.   Physical Exam Vitals reviewed.  Constitutional:      Appearance: Normal appearance.  HENT:     Head: Normocephalic.  Eyes:     Extraocular Movements: Extraocular movements intact.     Pupils: Pupils are  equal, round, and reactive to light.  Cardiovascular:     Rate and Rhythm: Normal rate and regular rhythm.     Pulses: Normal pulses.     Heart sounds: Normal heart sounds.  Pulmonary:     Effort: Pulmonary effort is normal.     Breath sounds: Normal breath sounds.  Musculoskeletal:        General: Normal range of motion.     Cervical back: Normal range of motion and neck supple.  Skin:    General: Skin is warm and dry.     Capillary Refill: Capillary refill takes less than 2 seconds.  Neurological:     General: No focal deficit present.     Mental Status: She is alert and oriented to person, place, and time.  Psychiatric:        Mood and Affect: Mood normal.        Behavior: Behavior normal.      ASSESSMENT & PLAN: Clinically stable.  Continues to improve.  No medical concerns identified during this visit.  CT scan of chest reviewed with patient.  Aortic atherosclerosis along with CAD noted.  Patient has history of minor reaction to statin medication.  Willing to start it again.  We will start rosuvastatin 10 mg daily. Follow-up in 4 to 6 weeks. Kaleb was seen today for pneumonia.  Diagnoses and all orders for this visit:  Post viral syndrome  Hypertensive heart and renal disease with congestive heart failure (HCC)  Sick sinus syndrome ([place])  Recurrent major depressive disorder, in full remission ([place])  Aortic atherosclerosis ([place]) -  rosuvastatin (CRESTOR) 10 MG tablet; Take 1 tablet (10 mg total) by mouth daily.  Pneumonia due to COVID-19 virus Comments: Improving    Patient Instructions       If you have lab work done today you will be contacted with your lab results within the next 2 weeks.  If you have not heard from [place] then please contact us. The fastest way to get your results is to register for My Chart.   IF you received an x-ray today, you will receive an invoice from [facility]. Please contact [facility] at [phone] with  questions or concerns regarding your invoice.   IF you received labwork today, you will receive an invoice from [place]. Please contact [facility] at [phone] with questions or concerns regarding your invoice.   Our billing staff will not be able to assist you with questions regarding bills from these companies.  You will be contacted with the lab results as soon as they are available. The fastest way to get your results is to activate your My Chart account. Instructions are located on the last page of this paperwork. If you have not heard from [place] regarding the results in 2 weeks, please contact this office.     Health Maintenance After Age 25 After age 54, you are at a higher risk for certain long-term diseases and infections as well as injuries from falls. Falls are a major cause of broken bones and head injuries in people who are older than age 14. Getting regular preventive care can help to keep you healthy and well. Preventive care includes getting regular testing and making lifestyle changes as recommended by your health care provider. Talk with your health care provider about:  Which screenings and tests you should have. A screening is a test that checks for a disease when you have no symptoms.  A diet and exercise plan that is right for you. What should I know about screenings and tests to prevent falls? Screening and testing are the best ways to find a health problem early. Early diagnosis and treatment give you the best chance of managing medical conditions that are common after age 41. Certain conditions and lifestyle choices may make you more likely to have a fall. Your health care provider may recommend:  Regular vision checks. Poor vision and conditions such as cataracts can make you more likely to have a fall. If you wear glasses, make sure to get your prescription updated if your vision changes.  Medicine review. Work with your health care provider to regularly review all of  the medicines you are taking, including over-the-counter medicines. Ask your health care provider about any side effects that may make you more likely to have a fall. Tell your health care provider if any medicines that you take make you feel dizzy or sleepy.  Osteoporosis screening. Osteoporosis is a condition that causes the bones to get weaker. This can make the bones weak and cause them to break more easily.  Blood pressure screening. Blood pressure changes and medicines to control blood pressure can make you feel dizzy.  Strength and balance checks. Your health care provider may recommend certain tests to check your strength and balance while standing, walking, or changing positions.  Foot health exam. Foot pain and numbness, as well as not wearing proper footwear, can make you more likely to have a fall.  Depression screening. You may be more likely to have a fall if you have a fear of falling, feel emotionally  low, or feel unable to do activities that you used to do.  Alcohol use screening. Using too much alcohol can affect your balance and may make you more likely to have a fall. What actions can I take to lower my risk of falls? General instructions  Talk with your health care provider about your risks for falling. Tell your health care provider if: ? You fall. Be sure to tell your health care provider about all falls, even ones that seem minor. ? You feel dizzy, sleepy, or off-balance.  Take over-the-counter and prescription medicines only as told by your health care provider. These include any supplements.  Eat a healthy diet and maintain a healthy weight. A healthy diet includes low-fat dairy products, low-fat (lean) meats, and fiber from whole grains, beans, and lots of fruits and vegetables. Home safety  Remove any tripping hazards, such as rugs, cords, and clutter.  Install safety equipment such as grab bars in bathrooms and safety rails on stairs.  Keep rooms and walkways  well-lit. Activity   Follow a regular exercise program to stay fit. This will help you maintain your balance. Ask your health care provider what types of exercise are appropriate for you.  If you need a cane or walker, use it as recommended by your health care provider.  Wear supportive shoes that have nonskid soles. Lifestyle  Do not drink alcohol if your health care provider tells you not to drink.  If you drink alcohol, limit how much you have: ? 0-1 drink a day for women. ? 0-2 drinks a day for men.  Be aware of how much alcohol is in your drink. In the [place] one drink equals one typical bottle of beer (12 oz), one-half glass of wine (5 oz), or one shot of hard liquor (1 oz).  Do not use any products that contain nicotine or tobacco, such as cigarettes and e-cigarettes. If you need help quitting, ask your health care provider. Summary  Having a healthy lifestyle and getting preventive care can help to protect your health and wellness after age 25.  Screening and testing are the best way to find a health problem early and help you avoid having a fall. Early diagnosis and treatment give you the best chance for managing medical conditions that are more common for people who are older than age 51.  Falls are a major cause of broken bones and head injuries in people who are older than age 9. Take precautions to prevent a fall at home.  Work with your health care provider to learn what changes you can make to improve your health and wellness and to prevent falls. This information is not intended to replace advice given to you by your health care provider. Make sure you discuss any questions you have with your health care provider. Document Revised: 05/19/2018 Document Reviewed: 12/09/2016 Elsevier Patient Education  2020 [employer].      Agustina Caroli, MD [facility]

## 2019-11-22 NOTE — Patient Instructions (Addendum)
   If you have lab work done today you will be contacted with your lab results within the next 2 weeks.  If you have not heard from us then please contact us. The fastest way to get your results is to register for My Chart.   IF you received an x-ray today, you will receive an invoice from [facility]. Please contact  Radiology at [phone] with questions or concerns regarding your invoice.   IF you received labwork today, you will receive an invoice from [facility]. Please contact [facility] at [phone] with questions or concerns regarding your invoice.   Our billing staff will not be able to assist you with questions regarding bills from these companies.  You will be contacted with the lab results as soon as they are available. The fastest way to get your results is to activate your My Chart account. Instructions are located on the last page of this paperwork. If you have not heard from us regarding the results in 2 weeks, please contact this office.     Health Maintenance After Age 65 After age 65, you are at a higher risk for certain long-term diseases and infections as well as injuries from falls. Falls are a major cause of broken bones and head injuries in people who are older than age 65. Getting regular preventive care can help to keep you healthy and well. Preventive care includes getting regular testing and making lifestyle changes as recommended by your health care provider. Talk with your health care provider about:  Which screenings and tests you should have. A screening is a test that checks for a disease when you have no symptoms.  A diet and exercise plan that is right for you. What should I know about screenings and tests to prevent falls? Screening and testing are the best ways to find a health problem early. Early diagnosis and treatment give you the best chance of managing medical conditions that are common after age 65. Certain conditions and  lifestyle choices may make you more likely to have a fall. Your health care provider may recommend:  Regular vision checks. Poor vision and conditions such as cataracts can make you more likely to have a fall. If you wear glasses, make sure to get your prescription updated if your vision changes.  Medicine review. Work with your health care provider to regularly review all of the medicines you are taking, including over-the-counter medicines. Ask your health care provider about any side effects that may make you more likely to have a fall. Tell your health care provider if any medicines that you take make you feel dizzy or sleepy.  Osteoporosis screening. Osteoporosis is a condition that causes the bones to get weaker. This can make the bones weak and cause them to break more easily.  Blood pressure screening. Blood pressure changes and medicines to control blood pressure can make you feel dizzy.  Strength and balance checks. Your health care provider may recommend certain tests to check your strength and balance while standing, walking, or changing positions.  Foot health exam. Foot pain and numbness, as well as not wearing proper footwear, can make you more likely to have a fall.  Depression screening. You may be more likely to have a fall if you have a fear of falling, feel emotionally low, or feel unable to do activities that you used to do.  Alcohol use screening. Using too much alcohol can affect your balance and may make you more likely to   have a fall. What actions can I take to lower my risk of falls? General instructions  Talk with your health care provider about your risks for falling. Tell your health care provider if: ? You fall. Be sure to tell your health care provider about all falls, even ones that seem minor. ? You feel dizzy, sleepy, or off-balance.  Take over-the-counter and prescription medicines only as told by your health care provider. These include any  supplements.  Eat a healthy diet and maintain a healthy weight. A healthy diet includes low-fat dairy products, low-fat (lean) meats, and fiber from whole grains, beans, and lots of fruits and vegetables. Home safety  Remove any tripping hazards, such as rugs, cords, and clutter.  Install safety equipment such as grab bars in bathrooms and safety rails on stairs.  Keep rooms and walkways well-lit. Activity   Follow a regular exercise program to stay fit. This will help you maintain your balance. Ask your health care provider what types of exercise are appropriate for you.  If you need a cane or walker, use it as recommended by your health care provider.  Wear supportive shoes that have nonskid soles. Lifestyle  Do not drink alcohol if your health care provider tells you not to drink.  If you drink alcohol, limit how much you have: ? 0-1 drink a day for women. ? 0-2 drinks a day for men.  Be aware of how much alcohol is in your drink. In the [place] one drink equals one typical bottle of beer (12 oz), one-half glass of wine (5 oz), or one shot of hard liquor (1 oz).  Do not use any products that contain nicotine or tobacco, such as cigarettes and e-cigarettes. If you need help quitting, ask your health care provider. Summary  Having a healthy lifestyle and getting preventive care can help to protect your health and wellness after age 65.  Screening and testing are the best way to find a health problem early and help you avoid having a fall. Early diagnosis and treatment give you the best chance for managing medical conditions that are more common for people who are older than age 65.  Falls are a major cause of broken bones and head injuries in people who are older than age 65. Take precautions to prevent a fall at home.  Work with your health care provider to learn what changes you can make to improve your health and wellness and to prevent falls. This information is not intended  to replace advice given to you by your health care provider. Make sure you discuss any questions you have with your health care provider. Document Revised: 05/19/2018 Document Reviewed: 12/09/2016 Elsevier Patient Education  2020 [employer] Inc.  

## 2019-11-23 NOTE — Telephone Encounter (Signed)
HRCT chest 11/16/19 >> atherosclerosis, 4.1 cm ascending aorta, patchy areas of ground-glass attenuation and airspace consolidation seen on the prior examination have significantly regressed, with areas of less severe ground-glass attenuation and extensive septal thickening and regional architectural distortion in their wake, mild air trapping indicative of small airways disease   Spoke with pt and reviewed results.  She still has some dyspnea but better than before.  Discussed option of trial on prednisone.  She would like to wait longer to see if her symptoms continue to improve.  Advised to give a couple of weeks, and if no improvement then call to discuss trial of prednisone.

## 2019-11-27 ENCOUNTER — Ambulatory Visit: Payer: Medicare HMO | Admitting: Pulmonary Disease

## 2019-11-28 DIAGNOSIS — R69 Illness, unspecified: Secondary | ICD-10-CM | POA: Diagnosis not present

## 2019-12-06 ENCOUNTER — Other Ambulatory Visit: Payer: Self-pay | Admitting: Physician Assistant

## 2019-12-06 DIAGNOSIS — R279 Unspecified lack of coordination: Secondary | ICD-10-CM

## 2019-12-14 ENCOUNTER — Other Ambulatory Visit: Payer: Self-pay | Admitting: Physician Assistant

## 2019-12-14 DIAGNOSIS — R279 Unspecified lack of coordination: Secondary | ICD-10-CM

## 2019-12-15 ENCOUNTER — Telehealth: Payer: Self-pay

## 2019-12-15 NOTE — Telephone Encounter (Signed)
Phone call to patient to verify medication list and allergies for myelogram procedure. Pt instructed to hold Celexa for 48hrs prior to myelogram appointment time and 24 hours after appointment. Pt also instructed to have a driver the day of the procedure, the procedure would take around 2 hours, and discharge instructions discussed. Pt verbalized understanding.

## 2019-12-19 ENCOUNTER — Ambulatory Visit
Admission: RE | Admit: 2019-12-19 | Discharge: 2019-12-19 | Disposition: A | Discharge status: Home / Self Care | Payer: Medicare HMO | Source: Ambulatory Visit | Attending: Physician Assistant | Admitting: Physician Assistant

## 2019-12-19 ENCOUNTER — Other Ambulatory Visit: Payer: Self-pay

## 2019-12-19 DIAGNOSIS — R200 Anesthesia of skin: Secondary | ICD-10-CM | POA: Diagnosis not present

## 2019-12-19 DIAGNOSIS — R279 Unspecified lack of coordination: Secondary | ICD-10-CM

## 2019-12-19 MED ORDER — IOPAMIDOL (ISOVUE-300) INJECTION 61%
75.0000 mL | Freq: Once | INTRAVENOUS | Status: AC | PRN
  Administered 2019-12-19: 75 mL via INTRAVENOUS

## 2019-12-20 ENCOUNTER — Emergency Department (HOSPITAL_COMMUNITY): Payer: Medicare HMO

## 2019-12-20 ENCOUNTER — Ambulatory Visit
Admission: RE | Admit: 2019-12-20 | Discharge: 2019-12-20 | Disposition: A | Discharge status: Home / Self Care | Payer: Medicare HMO | Source: Ambulatory Visit | Attending: Physician Assistant | Admitting: Physician Assistant

## 2019-12-20 ENCOUNTER — Emergency Department (HOSPITAL_COMMUNITY)
Admission: EM | Admit: 2019-12-20 | Discharge: 2019-12-20 | Disposition: A | Discharge status: Home / Self Care | Payer: Medicare HMO | Attending: Emergency Medicine | Admitting: Emergency Medicine

## 2019-12-20 DIAGNOSIS — R079 Chest pain, unspecified: Secondary | ICD-10-CM | POA: Diagnosis not present

## 2019-12-20 DIAGNOSIS — Z7902 Long term (current) use of antithrombotics/antiplatelets: Secondary | ICD-10-CM | POA: Diagnosis not present

## 2019-12-20 DIAGNOSIS — I222 Subsequent non-ST elevation (NSTEMI) myocardial infarction: Secondary | ICD-10-CM | POA: Diagnosis not present

## 2019-12-20 DIAGNOSIS — R279 Unspecified lack of coordination: Secondary | ICD-10-CM

## 2019-12-20 DIAGNOSIS — I77810 Thoracic aortic ectasia: Secondary | ICD-10-CM | POA: Diagnosis not present

## 2019-12-20 DIAGNOSIS — S2242XA Multiple fractures of ribs, left side, initial encounter for closed fracture: Secondary | ICD-10-CM | POA: Diagnosis not present

## 2019-12-20 DIAGNOSIS — I5032 Chronic diastolic (congestive) heart failure: Secondary | ICD-10-CM | POA: Insufficient documentation

## 2019-12-20 DIAGNOSIS — N182 Chronic kidney disease, stage 2 (mild): Secondary | ICD-10-CM | POA: Insufficient documentation

## 2019-12-20 DIAGNOSIS — Z951 Presence of aortocoronary bypass graft: Secondary | ICD-10-CM | POA: Insufficient documentation

## 2019-12-20 DIAGNOSIS — R0789 Other chest pain: Secondary | ICD-10-CM | POA: Diagnosis not present

## 2019-12-20 DIAGNOSIS — I130 Hypertensive heart and chronic kidney disease with heart failure and stage 1 through stage 4 chronic kidney disease, or unspecified chronic kidney disease: Secondary | ICD-10-CM | POA: Insufficient documentation

## 2019-12-20 DIAGNOSIS — Z79899 Other long term (current) drug therapy: Secondary | ICD-10-CM | POA: Diagnosis not present

## 2019-12-20 DIAGNOSIS — I480 Paroxysmal atrial fibrillation: Secondary | ICD-10-CM | POA: Diagnosis not present

## 2019-12-20 DIAGNOSIS — I708 Atherosclerosis of other arteries: Secondary | ICD-10-CM | POA: Diagnosis not present

## 2019-12-20 DIAGNOSIS — E039 Hypothyroidism, unspecified: Secondary | ICD-10-CM | POA: Diagnosis not present

## 2019-12-20 DIAGNOSIS — M5030 Other cervical disc degeneration, unspecified cervical region: Secondary | ICD-10-CM

## 2019-12-20 DIAGNOSIS — I25812 Atherosclerosis of bypass graft of coronary artery of transplanted heart without angina pectoris: Secondary | ICD-10-CM | POA: Diagnosis not present

## 2019-12-20 DIAGNOSIS — I774 Celiac artery compression syndrome: Secondary | ICD-10-CM | POA: Diagnosis not present

## 2019-12-20 DIAGNOSIS — I701 Atherosclerosis of renal artery: Secondary | ICD-10-CM | POA: Diagnosis not present

## 2019-12-20 DIAGNOSIS — Z950 Presence of cardiac pacemaker: Secondary | ICD-10-CM | POA: Diagnosis not present

## 2019-12-20 DIAGNOSIS — Z87891 Personal history of nicotine dependence: Secondary | ICD-10-CM | POA: Diagnosis not present

## 2019-12-20 DIAGNOSIS — Z8616 Personal history of COVID-19: Secondary | ICD-10-CM | POA: Diagnosis not present

## 2019-12-20 LAB — HEPATIC FUNCTION PANEL
ALT: 15 U/L (ref 0–44)
AST: 21 U/L (ref 15–41)
Albumin: 4.2 g/dL (ref 3.5–5.0)
Alkaline Phosphatase: 75 U/L (ref 38–126)
Bilirubin, Direct: <0.1 mg/dL (ref 0.0–0.2)
Total Bilirubin: 0.9 mg/dL (ref 0.3–1.2)
Total Protein: 7.1 g/dL (ref 6.5–8.1)

## 2019-12-20 LAB — CBC
HCT: 43.3 % (ref 36.0–46.0)
Hemoglobin: 14.3 g/dL (ref 12.0–15.0)
MCH: 29.7 pg (ref 26.0–34.0)
MCHC: 33 g/dL (ref 30.0–36.0)
MCV: 89.8 fL (ref 80.0–100.0)
Platelets: 301 10*3/uL (ref 150–400)
RBC: 4.82 MIL/uL (ref 3.87–5.11)
RDW: 13.2 % (ref 11.5–15.5)
WBC: 6.7 10*3/uL (ref 4.0–10.5)
nRBC: 0 % (ref 0.0–0.2)

## 2019-12-20 LAB — TROPONIN I (HIGH SENSITIVITY)
Troponin I (High Sensitivity): 6 ng/L (ref <18)
Troponin I (High Sensitivity): 6 ng/L (ref <18)

## 2019-12-20 LAB — BASIC METABOLIC PANEL
Anion gap: 11 (ref 5–15)
BUN: 13 mg/dL (ref 8–23)
CO2: 23 mmol/L (ref 22–32)
Calcium: 9.4 mg/dL (ref 8.9–10.3)
Chloride: 102 mmol/L (ref 98–111)
Creatinine, Ser: 0.83 mg/dL (ref 0.44–1.00)
GFR, Estimated: >60 mL/min (ref >60)
Glucose, Bld: 123 mg/dL — ABNORMAL HIGH (ref 70–99)
Potassium: 4.6 mmol/L (ref 3.5–5.1)
Sodium: 136 mmol/L (ref 135–145)

## 2019-12-20 LAB — LIPASE, BLOOD: Lipase: 37 U/L (ref 11–51)

## 2019-12-20 MED ORDER — NITROGLYCERIN 0.4 MG SL SUBL
0.4000 mg | SUBLINGUAL_TABLET | SUBLINGUAL | Status: DC | PRN
  Filled 2019-12-20: qty 1

## 2019-12-20 MED ORDER — DIAZEPAM 5 MG PO TABS: 5.0000 mg | ORAL_TABLET | Freq: Once | ORAL | Status: DC

## 2019-12-20 MED ORDER — ASPIRIN 81 MG PO CHEW
324.0000 mg | CHEWABLE_TABLET | Freq: Once | ORAL | Status: AC
  Administered 2019-12-20: 324 mg via ORAL
  Filled 2019-12-20: qty 4

## 2019-12-20 MED ORDER — NITROGLYCERIN 0.4 MG SL SUBL: 0.4000 mg | SUBLINGUAL_TABLET | SUBLINGUAL | Status: DC | PRN

## 2019-12-20 NOTE — Discharge Instructions (Signed)
Please follow up with your cardiologist within 5-7 days for re-evaluation of your symptoms.   Please return to the emergency department for any new or worsening symptoms.

## 2019-12-20 NOTE — Progress Notes (Signed)
Patient states she has been off Celexa for at least the past two days. 

## 2019-12-20 NOTE — Progress Notes (Signed)
Pt arrived to [facility] imaging to have myelogram procedure. Upon arrival in nurses station patient started to c/o of a "dull pain" she had been having in her left chest and back "since yesterday". Pts vitals were obtained, see flowsheet. Dr. Jeralyn Ruths was notified of this compliant prior to procedure and preceded to speak with pt at the nurses station. After speaking with the patient Dr. Jeralyn Ruths felt the patient needed to be seen at the ER for her pain and not proceed with the Myelogram today. Dr. Jeralyn Ruths offered to call 911 and an ambulance take the patient to the ED. Pt refused this and stated he daughter would drive her there. Pt appeared to be in no distress and denied SOB and dizziness.  Pts daughter was in the car waiting, pts daughter was brought in and also spoke with Dr. Jeralyn Ruths who again voiced his concern. Pt and daughter was walked out of the building and reported they were going to the ED at [facility].

## 2019-12-20 NOTE — ED Provider Notes (Signed)
[name] Provider Note   CSN: [id number] Arrival date & time: 12/20/19  1109     History Chief Complaint  Patient presents with  . Chest Pain    [name] [name] is a 79 y.o. female with PMHx HTN, HLD, A fib on Plavix, NSTEMI s/p CABG and stents, who presents to the ED today with complaint of gradual onset, intermittent, left sided chest pain radiating into back that began yesterday with associated SOB. Pt reports she woke up yesterday with the pain. She did not think much of it as she had a function later in the day to attend. She states after several hours it dissipated on its own. This morning she woke up again with similar pain however reports it feels worse. She was scheduled to have a CT C spine today at [facility]; when she arrived she mentioned having chest pain as well and she was sent here for further evaluation. Pt does have hx of MI in the past and states this feels similar however at that time she was having right sided pain. She has not taken anything for the pain PTA. Denies diaphoresis, nausea, vomiting, or any other associated symptoms.   The history is provided by the patient and medical records.    HPI: A 79 year old patient with a history of hypertension and hypercholesterolemia presents for evaluation of chest pain. Initial onset of pain was more than 6 hours ago. The patient's chest pain is well-localized and is not worse with exertion. The patient's chest pain is middle- or left-sided, is not described as heaviness/pressure/tightness, is not sharp and does not radiate to the arms/jaw/neck. The patient does not complain of nausea and denies diaphoresis. The patient has no history of stroke, has no history of peripheral artery disease, has not smoked in the past 90 days, denies any history of treated diabetes, has no relevant family history of coronary artery disease (first degree relative at less than age 62) and does not  have an elevated BMI (>=30).   Past Medical History:  Diagnosis Date  . ANXIETY 12/31/2009  . Aortic insufficiency    a. mod by echo 2017.  [place] Kitchen CAD (coronary artery disease)    a. s/p CABGx3 and LAA clipping in 12/2014, NSTEMI 05/2015 s/p DES to native RCA and SVG-dRCA; occluded ramus-SVG was treated medically). c. neg nuc 10/2015 at [facility].  . Chronic diastolic CHF (congestive heart failure) ([place])   . Chronic fatigue   . CKD (chronic kidney disease), stage II   . DEPRESSION 12/31/2009  . [place] DISEASE, CERVICAL 12/31/2009  . [place] DISEASE, LUMBAR 12/31/2009  . DIVERTICULITIS, HX OF 12/31/2009  . Essential hypertension   . Gait abnormality 03/25/2017  . GLUCOSE INTOLERANCE 12/31/2009  . Habitual alcohol use   . History of stroke    self reports " they say i have had some pin strokes"   . Hypercalcemia   . Hyperkalemia   . Hyperlipidemia 12/31/2009  . Hypothyroidism   . MENOPAUSE, EARLY 12/31/2009  . NSTEMI (non-ST elevated myocardial infarction) ([place]) 05/11/2015  . Orthostatic hypotension   . OSTEOARTHRITIS, HAND   . Pacemaker 01/21/2012   MDT Adapta dual chamber  . Paroxysmal atrial flutter ([place])   . Persistent atrial fibrillation ([place])   . PVD (peripheral vascular disease), hx stents to bil SFAs 02/2010   . Rash, skin     superficial raised red pencil point sized rash bilateral forearms; states " it started when i started the Plavix "   .  Ruptured left breast implant   . Symptomatic sinus bradycardia 01/22/2012  . Syncope    a. 11/2015 ? due to medications.  . Syncope    reports on 05-10-17 " i passed out 2 months ago in the bathroom and broke some ribs"    . Tachy-brady syndrome ([place])    a. s/p MDT PPM 2013.  . Tricuspid regurgitation     Patient Active Problem List   Diagnosis Date Noted  . Statin intolerance 11/22/2019  . Aortic atherosclerosis ([place]) 11/22/2019  . Acute respiratory failure due to COVID-19 ([place]) 10/10/2019  . Hyponatremia 10/10/2019  . Hypothyroidism  (acquired) 02/08/2018  . Coronary artery disease involving coronary bypass graft of native heart without angina pectoris 10/22/2017  . Dyslipidemia 10/22/2017  . Cerebrovascular small vessel disease 04/27/2017  . Gait abnormality 03/25/2017  . Cerebrovascular disease 03/25/2017  . Claudication in peripheral vascular disease ([place]) 02/15/2017  . Hyperparathyroidism ([place]) 05/13/2016  . Pacemaker 01/10/2016  . Benign neoplasm of ascending colon   . Long term current use of anticoagulant   . CKD (chronic kidney disease), stage II   . H/O amiodarone therapy 12/04/2015  . Chronic diastolic heart failure ([place]) 10/19/2015  . Atypical atrial flutter ([place])   . Paroxysmal atrial flutter ([place]) 08/23/2015  . Chronic fatigue 06/27/2015  . Stented coronary artery   . S/P CABG x 3 01/04/15 01/10/2015  . CAD (coronary artery disease) 01/04/2015  . PAF (paroxysmal atrial fibrillation) ([place]) 01/22/2012  . Sinus node dysfunction ([place]) 01/22/2012  . S/P placement of cardiac pacemaker, medtronic adapta 01/21/12 01/22/2012  . PVD (peripheral vascular disease), hx stents to bil SFAs 02/2010 01/22/2012  . Hyperlipidemia 12/31/2009  . Anxiety 12/31/2009  . DEPRESSION 12/31/2009  . Essential hypertension 12/31/2009  . Coronary atherosclerosis 12/31/2009  . OSTEOARTHRITIS, HAND 12/31/2009  . [place] DISEASE, CERVICAL 12/31/2009  . [place] DISEASE, LUMBAR 12/31/2009  . UNSPECIFIED URINARY INCONTINENCE 12/31/2009    Past Surgical History:  Procedure Laterality Date  . ABDOMINAL AORTOGRAM W/LOWER EXTREMITY Bilateral 02/15/2017   Procedure: ABDOMINAL AORTOGRAM W/LOWER EXTREMITY;  Surgeon: Lorretta Harp, MD;  Location: [place] [facility];  Service: Cardiovascular;  Laterality: Bilateral;  bilat  . ABDOMINAL AORTOGRAM W/LOWER EXTREMITY N/A 10/06/2018   Procedure: ABDOMINAL AORTOGRAM W/LOWER EXTREMITY;  Surgeon: Lorretta Harp, MD;  Location: [place] [facility];  Service: Cardiovascular;  Laterality: N/A;  .  Ablation of typical atrial flutter (CTI line)  09/05/2016   Dr Antonieta Pert at [facility]   . Panacea   Removed 01/2018  . BREAST IMPLANT REMOVAL Bilateral 01/14/2018   Procedure: REMOVAL BILATERAL BREAST IMPLANTS;  Surgeon: Irene Limbo, MD;  Location: [place];  Service: Plastics;  Laterality: Bilateral;  . CAPSULECTOMY Bilateral 01/14/2018   Procedure: CAPSULECTOMY;  Surgeon: Irene Limbo, MD;  Location: [place];  Service: Plastics;  Laterality: Bilateral;  . CARDIAC CATHETERIZATION N/A 01/01/2015   Procedure: Left Heart Cath and Coronary Angiography;  Surgeon: Jettie Booze, MD;  Location: [place] [facility];  Service: Cardiovascular;  Laterality: N/A;  . CARDIAC CATHETERIZATION N/A 05/12/2015   Procedure: Left Heart Cath and Coronary Angiography;  Surgeon: Troy Sine, MD;  Location: [place] [facility];  Service: Cardiovascular;  Laterality: N/A;  . CARDIAC CATHETERIZATION N/A 05/12/2015   Procedure: Coronary Stent Intervention;  Surgeon: Troy Sine, MD;  Location: [place] [facility];  Service: Cardiovascular;  Laterality: N/A;  . CARDIOVERSION N/A 02/01/2015   Procedure: CARDIOVERSION;  Surgeon:  Lelon Perla, MD;  Location: [facility] ENDOSCOPY;  Service: Cardiovascular;  Laterality: N/A;  . CARDIOVERSION N/A 08/30/2015   Procedure: CARDIOVERSION;  Surgeon: Sanda Klein, MD;  Location: [place] ENDOSCOPY;  Service: Cardiovascular;  Laterality: N/A;  . CARPAL TUNNEL RELEASE  2008   "right hand/thumb; carpal tunnel repair; got rid of arthritis" (01/21/2012)  . CLIPPING OF ATRIAL APPENDAGE N/A 01/04/2015   Procedure: CLIPPING OF ATRIAL APPENDAGE;  Surgeon: Grace Isaac, MD;  Location: [place];  Service: Open Heart Surgery;  Laterality: N/A;  . COLONOSCOPY N/A 12/21/2015   Procedure: COLONOSCOPY;  Surgeon: Milus Banister, MD;  Location: [facility] ENDOSCOPY;  Service: Endoscopy;  Laterality: N/A;  . CORONARY ARTERY BYPASS GRAFT N/A  01/04/2015   Procedure: CORONARY ARTERY BYPASS GRAFTING (CABG) x 3 using left internal mammory artery and greater saphenous vein right leg harvested endoscopically.;  Surgeon: Grace Isaac, MD;  LIMA-LAD, SVG-RI, SVG-PDA  . ESOPHAGOGASTRODUODENOSCOPY (EGD) WITH PROPOFOL N/A 12/18/2015   Procedure: ESOPHAGOGASTRODUODENOSCOPY (EGD) WITH PROPOFOL;  Surgeon: Milus Banister, MD;  Location: [facility];  Service: Endoscopy;  Laterality: N/A;  . FACELIFT, LOWER 2/3  1995   "mini" (01/21/2012)  . GIVENS CAPSULE STUDY N/A 12/21/2015   Procedure: GIVENS CAPSULE STUDY;  Surgeon: Milus Banister, MD;  Location: [facility];  Service: Endoscopy;  Laterality: N/A;  . Lower Arterial Examination  10/28/2011   R. SFA stent mild-moderate mixed density plaque with elevated velocities consistent with 50% diameter reduction. L. SFA stent moderate mixed denisty plaque at mid to distal level consistent with 50-69% diameter reduction.  . LOWER EXTREMITY ANGIOGRAPHY N/A 02/15/2017   Procedure: LOWER EXTREMITY ANGIOGRAPHY;  Surgeon: Lorretta Harp, MD;  Location: [place] [facility];  Service: Cardiovascular;  Laterality: N/A;  . OOPHORECTOMY  ~1979  . PARATHYROIDECTOMY N/A 05/13/2017   Procedure: PARATHYROIDECTOMY;  Surgeon: Armandina Gemma, MD;  Location: [facility];  Service: General;  Laterality: N/A;  . PARTIAL COLECTOMY  2010  . PERIPHERAL ARTERIAL STENT GRAFT  2012; 2012   "LLE; RLE" (01/21/2012)  . PERIPHERAL VASCULAR BALLOON ANGIOPLASTY Right 02/15/2017   Procedure: PERIPHERAL VASCULAR BALLOON ANGIOPLASTY;  Surgeon: Lorretta Harp, MD;  Location: [place] [facility];  Service: Cardiovascular;  Laterality: Right;  SFA    . PERIPHERAL VASCULAR INTERVENTION Right 10/06/2018   Procedure: PERIPHERAL VASCULAR INTERVENTION;  Surgeon: Lorretta Harp, MD;  Location: [place] [facility];  Service: Cardiovascular;  Laterality: Right;  . PERMANENT PACEMAKER INSERTION N/A 01/21/2012   Medtronic Adapta L implanted by Dr  Sallyanne Kuster for tachy/brady syndrome  . POSTERIOR CERVICAL LAMINECTOMY  1985  . TEE WITHOUT CARDIOVERSION N/A 01/04/2015   Procedure: TRANSESOPHAGEAL ECHOCARDIOGRAM (TEE);  Surgeon: Grace Isaac, MD;  Location: [place];  Service: Open Heart Surgery;  Laterality: N/A;  . TEE WITHOUT CARDIOVERSION N/A 02/01/2015   Procedure: TRANSESOPHAGEAL ECHOCARDIOGRAM (TEE);  Surgeon: Lelon Perla, MD;  Location: [facility] ENDOSCOPY;  Service: Cardiovascular;  Laterality: N/A;  . TEE WITHOUT CARDIOVERSION N/A 08/30/2015   Procedure: TRANSESOPHAGEAL ECHOCARDIOGRAM (TEE);  Surgeon: Sanda Klein, MD;  Location: [facility] ENDOSCOPY;  Service: Cardiovascular;  Laterality: N/A;  . [place]     OB History   No obstetric history on file.     Family History  Problem Relation Age of Onset  . Heart disease Mother   . Hypertension Mother   . Stroke Mother   . Stroke Father   . Heart disease Father   . Heart disease Brother   . Hypertension Brother  2 brothers  . CVA Brother        2 brothers  . Heart attack Brother        2 brothers    Social History   Tobacco Use  . Smoking status: Former Smoker    Packs/day: 0.75    Years: 10.00    Pack years: 7.50    Types: Cigarettes    Quit date: 2008    Years since quitting: 13.8  . Smokeless tobacco: Never Used  . Tobacco comment: 01/21/2012 "quit smoking ~ 2002"  Vaping Use  . Vaping Use: Never used  Substance Use Topics  . Alcohol use: Yes    Comment: daily coctail  . Drug use: No    Home Medications Prior to Admission medications   Medication Sig Start Date End Date Taking? Authorizing Provider  albuterol (VENTOLIN HFA) 108 (90 Base) MCG/ACT inhaler Inhale 1-2 puffs into the lungs every 6 (six) hours as needed for wheezing or shortness of breath. 09/05/19  Yes Darr, Edison Nasuti, PA-C  alendronate (FOSAMAX) 70 MG tablet Take 70 mg by mouth every Saturday. 11/27/19  Yes [provider]  amLODipine (NORVASC) 5 MG tablet TAKE 1.5  TABLETS (7.5 MG TOTAL) BY MOUTH DAILY. Patient taking differently: Take 2.5-5 mg by mouth See admin instructions. Take 5mg  in the morning and 2.5mg  in the evening. 08/30/19  Yes Lorretta Harp, MD  aspirin EC 81 MG tablet Take 81 mg by mouth daily.    Yes [provider]  Cholecalciferol (VITAMIN D3 PO) Take 1 tablet by mouth daily.   Yes [provider]  clopidogrel (PLAVIX) 75 MG tablet TAKE 1 TABLET (75 MG TOTAL) BY MOUTH DAILY WITH BREAKFAST. 10/09/19  Yes Lorretta Harp, MD  dofetilide (TIKOSYN) 250 MCG capsule Take 1 capsule (250 mcg total) by mouth 2 (two) times daily. 04/06/19  Yes Croitoru, Mihai, MD  furosemide (LASIX) 40 MG tablet Take 40 mg by mouth daily as needed for fluid or edema.    Yes [provider]  levothyroxine (SYNTHROID, LEVOTHROID) 25 MCG tablet Take 25 mcg by mouth daily before breakfast.  04/03/16  Yes [provider]  Multiple Vitamins-Minerals (MULTI-VITE) LIQD Take 1 tablet by mouth daily.    Yes [provider]  nitroGLYCERIN (NITROSTAT) 0.4 MG SL tablet Place 1 tablet (0.4 mg total) under the tongue every 5 (five) minutes x 3 doses as needed for chest pain. 05/15/15  Yes Simmons, Brittainy M, PA-C  Omega-3 Fatty Acids (OMEGA 3 PO) Take 690 mg by mouth daily.   Yes [provider]  rosuvastatin (CRESTOR) 10 MG tablet Take 1 tablet (10 mg total) by mouth daily. 11/22/19  Yes Sagardia, Ines Bloomer, MD  TURMERIC PO Take 475 mg by mouth daily.   Yes [provider]  vitamin C (ASCORBIC ACID) 500 MG tablet Take 500 mg by mouth daily.   Yes [provider]  citalopram (CELEXA) 40 MG tablet TAKE 1 TABLET BY MOUTH EVERY DAY Patient taking differently: Take 40 mg by mouth daily.  10/09/19   Horald Pollen, MD    Allergies    Brilinta [ticagrelor], Atorvastatin, Clonidine derivatives, Crestor [rosuvastatin calcium], Exforge [amlodipine besylate-valsartan], and Spironolactone  Review of Systems     Review of Systems  Constitutional: Negative for chills, diaphoresis and fever.  Respiratory: Positive for shortness of breath.   Cardiovascular: Positive for chest pain. Negative for palpitations and leg swelling.  Gastrointestinal: Negative for nausea and vomiting.  Musculoskeletal: Positive for back pain.  All other systems reviewed and are negative.   Physical Exam Updated Vital Signs BP (!) 171/89 (BP Location: Right Arm)   Pulse 81   Temp 98.1 F (36.7 C) (Oral)   Resp 16   SpO2 95%   Physical Exam Vitals and nursing note reviewed.  Constitutional:      Appearance: She is not ill-appearing or diaphoretic.  HENT:     Head: Normocephalic and atraumatic.  Eyes:     Conjunctiva/sclera: Conjunctivae normal.  Cardiovascular:     Rate and Rhythm: Normal rate and regular rhythm.     Pulses:          Radial pulses are 2+ on the right side and 2+ on the left side.       Dorsalis pedis pulses are 2+ on the right side and 2+ on the left side.     Heart sounds: Normal heart sounds.  Pulmonary:     Effort: Pulmonary effort is normal.     Breath sounds: Normal breath sounds. No decreased breath sounds, wheezing, rhonchi or rales.    Chest:     Chest wall: Tenderness present.    Abdominal:     Palpations: Abdomen is soft.     Tenderness: There is no abdominal tenderness. There is no guarding or rebound.  Musculoskeletal:     Cervical back: Neck supple.  Skin:    General: Skin is warm and dry.  Neurological:     Mental Status: She is alert.     ED Results / Procedures / Treatments   Labs (all labs ordered are listed, but only abnormal results are displayed) Labs Reviewed  BASIC METABOLIC PANEL - Abnormal; Notable for the following components:      Result Value   Glucose, Bld 123 (*)    All other components within normal limits  CBC  HEPATIC FUNCTION PANEL  LIPASE, BLOOD  TROPONIN I (HIGH SENSITIVITY)  TROPONIN I (HIGH SENSITIVITY)    EKG EKG  Interpretation  Date/Time:  Wednesday December 20 2019 12:10:50 EST Ventricular Rate:  75 PR Interval:  180 QRS Duration: 90 QT Interval:  425 QTC Calculation: 475 R Axis:   72 Text Interpretation: Atrial-paced rhythm Anteroseptal infarct, age indeterminate When compred to prior, similar apperance. No STEMI Confirmed by Antony Blackbird [phone]) on 12/20/2019 12:49:35 PM   Radiology CT HEAD W & WO CONTRAST  Result Date: 12/19/2019 CLINICAL DATA:  79 year old female status post COVID-19 in August. Left hand and arm numbness and weakness while hospitalized at that time. Pacemaker, unable to have MRI. Creatinine was obtained on site at [facility] at [address] Results: Creatinine 0.9 mg/dL. EXAM: CT HEAD WITHOUT AND WITH CONTRAST TECHNIQUE: Contiguous axial images were obtained from the base of the skull through the vertex without and with intravenous contrast CONTRAST:  56mL ISOVUE-300 IOPAMIDOL (ISOVUE-300) INJECTION 61% COMPARISON:  Head CT without contrast 03/23/2017. FINDINGS: Brain: Stable cerebral volume since 2019. Advanced chronic Patchy and confluent bilateral cerebral white matter hypodensity appears stable. Deep gray matter nuclei, brainstem and cerebellum appear to remain spared. No cortical encephalomalacia identified. No midline shift, ventriculomegaly, mass effect, evidence of mass lesion, intracranial hemorrhage or evidence of cortically based acute infarction. No abnormal enhancement identified. Vascular: Mild Calcified atherosclerosis at the skull base. The major intracranial vascular structures are enhancing as expected. Skull: Stable, negative. Sinuses/Orbits: Partially visible left maxillary sinus retention cyst, otherwise the visible paranasal sinuses, tympanic cavities, and mastoids are clear. Other: Stable orbit and scalp soft tissues. IMPRESSION:  Stable CT appearance of advanced cerebral white matter disease since 2019. No new or acute intracranial abnormality  identified. Electronically Signed   By: Genevie Ann M.D.   On: 12/19/2019 23:47   DG Chest Port 1 View  Result Date: 12/20/2019 CLINICAL DATA:  Chest pain on the left. EXAM: PORTABLE CHEST 1 VIEW COMPARISON:  11/06/2019 FINDINGS: Previous median sternotomy and CABG. Dual lead pacemaker. Atrial clip. Heart size upper limits of normal. Chronic aortic atherosclerosis. Chronic lung disease with emphysema and scarring. No sign of edema or active infiltrate, collapse or effusion. No acute bone finding. IMPRESSION: No active disease. Previous CABG. Atrial clip. Aortic atherosclerosis. Chronic lung disease. Electronically Signed   By: Nelson Chimes M.D.   On: 12/20/2019 11:50   CT Angio Chest/Abd/Pel for Dissection W and/or W/WO  Result Date: 12/20/2019 CLINICAL DATA:  Chest and back pain EXAM: CT ANGIOGRAPHY CHEST, ABDOMEN AND PELVIS TECHNIQUE: Non-contrast CT of the chest was initially obtained. Multidetector CT imaging through the chest, abdomen and pelvis was performed using the standard protocol during bolus administration of intravenous contrast. Multiplanar reconstructed images and MIPs were obtained and reviewed to evaluate the vascular anatomy. CONTRAST:  80 mL Omnipaque 350. COMPARISON:  Chest x-ray from earlier in the same day, CT from 11/16/2019. FINDINGS: CTA CHEST FINDINGS Cardiovascular: Thoracic aorta demonstrates atherosclerotic calcifications. Changes of prior coronary bypass grafting are noted. Ascending aorta demonstrates mild prominence at 3.9 cm. No evidence of dissection is noted. Pulmonary artery as visualized is within normal limits. No cardiac enlargement is seen. Mediastinum/Nodes: Thoracic inlet is within normal limits. No hilar or mediastinal adenopathy is noted. The esophagus is within normal limits as visualized. Lungs/Pleura: Mild mosaic attenuation is noted consistent with air trapping. This is similar to that seen on prior CT of the chest. Additionally some mild residual scarring is  noted from prior infiltrate. No acute infiltrate or sizable effusion is seen nodules are noted. Musculoskeletal: Changes of prior median sternotomy are noted. Degenerative changes of the thoracic spine are seen. No acute bony abnormality is noted. Old healed rib fractures are seen on the left. Review of the MIP images confirms the above findings. CTA ABDOMEN AND PELVIS FINDINGS VASCULAR Aorta: Atherosclerotic calcifications are noted. No dissection or aneurysmal dilatation is seen. Celiac: Mild stenosis is noted at the origin. SMA: Patent without evidence of aneurysm, dissection, vasculitis or significant stenosis. Renals: High-grade stenosis of the proximal right renal artery is noted. Left renal artery appears within normal limits. IMA: Patent without evidence of aneurysm, dissection, vasculitis or significant stenosis. Iliacs: Atherosclerotic calcifications are noted without aneurysmal dilatation or focal significant narrowing. Veins: No specific venous abnormality is noted. Review of the MIP images confirms the above findings. NON-VASCULAR Hepatobiliary: Fatty infiltration of the liver is noted. The gallbladder is unremarkable. Pancreas: Unremarkable. No pancreatic ductal dilatation or surrounding inflammatory changes. Spleen: Normal in size without focal abnormality. Adrenals/Urinary Tract: The adrenal glands demonstrates some nodularity bilaterally. This is stable from the prior exam. Bladder is within normal limits. Stomach/Bowel: Colon shows no obstructive or inflammatory changes. Postsurgical changes are noted in the left colon consistent with the given clinical history. The appendix is visualized without inflammatory change. Small bowel and stomach appear within normal limits. Lymphatic: No significant lymphadenopathy is noted. Reproductive: Prostate is unremarkable. Other: No abdominal wall hernia or abnormality. No abdominopelvic ascites. Musculoskeletal: No acute or significant osseous findings. Review  of the MIP images confirms the above findings. IMPRESSION: CTA of the chest: No evidence of thoracic aortic dissection.  Mild ectasias of the ascending aorta is noted. Scarring in the lungs stable from prior CT. No acute abnormality is noted. CTA of the abdomen and pelvis: Atherosclerotic calcifications of the abdominal aorta are noted. No dissection or aneurysmal dilatation is seen. 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 Fatty liver. Post surgical changes in the left colon. Electronically Signed   By: Inez Catalina M.D.   On: 12/20/2019 14:47    Procedures Procedures (including critical care time)  Medications Ordered in ED Medications  nitroGLYCERIN (NITROSTAT) SL tablet 0.4 mg (has no administration in time range)  aspirin chewable tablet 324 mg (324 mg Oral Given 12/20/19 1144)    ED Course  I have reviewed the triage vital signs and the nursing notes.  Pertinent labs & imaging results that were available during my care of the patient were reviewed by me and considered in my medical decision making (see chart for details).    MDM Rules/Calculators/A&P HEAR Score: 60                        79 year old female with a history of MI status post CABG and stenting who presents to the ED today with intermittent left-sided chest pain radiating into back that began yesterday morning.  2 episodes since then including 1 active 1 currently.  Was at [facility] imaging earlier today and mentioned having chest pain was sent here for further evaluation.  On arrival to the ED vitals are stable.  Patient is afebrile, nontachycardic nontachypneic.  She appear to be in no acute distress.  Blood pressure mildly elevated 171/89.  On exam patient has left-sided chest wall anterior tenderness palpation as well as posterior tenderness palpation.  She has equal pulses bilaterally.  Lungs clear to auscultation bilaterally.  EKG was obtained which does appear similar to previous check  tracing, nonspecific ST and T wave abnormalities.  Patient recently had a CT chest high-resolution on 11/16/2019 secondary to Covid diagnosis on 8/31.  It was found that she has ectasia of ascending thoracic aorta measuring 4.1 cm in diameter.  Given her age, complaint of chest pain radiating to back will obtain dissection study at this time.  We will also work-up for ACS with chest x-ray, troponins.  Will likely touch base with cardiology given patient's history.  She may need to be admitted.  She does have a positive Covid test in our system from 8/31 and is not recommended for repeat testing at this time as it may be falsely positive.  Aspirin provided for patient given chest pain, will hold off on any type of nitroglycerin prior to CT A.   Troponin of 6. Will repeat.  Remainder of labwork unremarkable. CBC without leukocytosis; hgb stable at 14.3. BMP without electrolyte abnormalities. LFTs and Lipase within normal limits.   CTA has returned without any signs of dissection today. On reevaluation pt still reports she is having dull achy left sided chest pain. Will provide NTG at this time. Still awaiting repeat troponin. Will consult cardiology.   Cardiology to come evaluate patient given cardiac history. It appears pt refused the nitroglycerin stating her chest pain was only a 3/10. Repeat troponin still pending. If elevated will need to provide nitro and morphine.   At shift change case signed out to [employer], PA-C, who will dispo patient accordingly after cardiology evaluates patient and repeat troponin testing.   Final Clinical Impression(s) / ED Diagnoses Final diagnoses:  Atypical  chest pain    Rx / DC Orders ED Discharge Orders    None       Eustaquio Maize, PA-C 12/20/19 1539    Tegeler, Gwenyth Allegra, MD 12/21/19 [phone]

## 2019-12-20 NOTE — ED Provider Notes (Signed)
Care assumed from [facility] [place]. See her note for full H&P.   Per her note, "[name] is a 79 y.o. female with PMHx HTN, HLD, A fib on Plavix, NSTEMI s/p CABG and stents, who presents to the ED today with complaint of gradual onset, intermittent, left sided chest pain radiating into back that began yesterday with associated SOB. Pt reports she woke up yesterday with the pain. She did not think much of it as she had a function later in the day to attend. She states after several hours it dissipated on its own. This morning she woke up again with similar pain however reports it feels worse. She was scheduled to have a CT C spine today at [place]; when she arrived she mentioned having chest pain as well and she was sent here for further evaluation. Pt does have hx of MI in the past and states this feels similar however at that time she was having right sided pain. She has not taken anything for the pain PTA. Denies diaphoresis, nausea, vomiting, or any other associated symptoms.   The history is provided by the patient and medical records. "  Physical Exam  BP 133/74   Pulse 75   Temp 98.1 F (36.7 C) (Oral)   Resp 18   SpO2 95%   Physical Exam Vitals and nursing note reviewed.  Constitutional:      General: She is not in acute distress.    Appearance: She is well-developed.  HENT:     Head: Normocephalic and atraumatic.  Eyes:     Conjunctiva/sclera: Conjunctivae normal.  Cardiovascular:     Rate and Rhythm: Normal rate.  Pulmonary:     Effort: Pulmonary effort is normal.  Musculoskeletal:        General: Normal range of motion.     Cervical back: Neck supple.  Skin:    General: Skin is warm and dry.  Neurological:     Mental Status: She is alert.      ED Course/Procedures     Procedures  Results for orders placed or performed during the hospital encounter of 56/31/49  Basic metabolic panel  Result Value Ref Range   Sodium 136 135 - 145 mmol/L    Potassium 4.6 3.5 - 5.1 mmol/L   Chloride 102 98 - 111 mmol/L   CO2 23 22 - 32 mmol/L   Glucose, Bld 123 (H) 70 - 99 mg/dL   BUN 13 8 - 23 mg/dL   Creatinine, Ser 0.83 0.44 - 1.00 mg/dL   Calcium 9.4 8.9 - 10.3 mg/dL   GFR, Estimated >60 >60 mL/min   Anion gap 11 5 - 15  CBC  Result Value Ref Range   WBC 6.7 4.0 - 10.5 K/uL   RBC 4.82 3.87 - 5.11 MIL/uL   Hemoglobin 14.3 12.0 - 15.0 g/dL   HCT 43.3 36 - 46 %   MCV 89.8 80.0 - 100.0 fL   MCH 29.7 26.0 - 34.0 pg   MCHC 33.0 30.0 - 36.0 g/dL   RDW 13.2 11.5 - 15.5 %   Platelets 301 150 - 400 K/uL   nRBC 0.0 0.0 - 0.2 %  Hepatic function panel  Result Value Ref Range   Total Protein 7.1 6.5 - 8.1 g/dL   Albumin 4.2 3.5 - 5.0 g/dL   AST 21 15 - 41 U/L   ALT 15 0 - 44 U/L   Alkaline Phosphatase 75 38 - 126 U/L   Total Bilirubin  0.9 0.3 - 1.2 mg/dL   Bilirubin, Direct <0.1 0.0 - 0.2 mg/dL   Indirect Bilirubin NOT CALCULATED 0.3 - 0.9 mg/dL  Lipase, blood  Result Value Ref Range   Lipase 37 11 - 51 U/L  Troponin I (High Sensitivity)  Result Value Ref Range   Troponin I (High Sensitivity) 6 <18 ng/L  Troponin I (High Sensitivity)  Result Value Ref Range   Troponin I (High Sensitivity) 6 <18 ng/L   CT HEAD W & WO CONTRAST  Result Date: 12/19/2019 CLINICAL DATA:  79 year old female status post COVID-19 in August. Left hand and arm numbness and weakness while hospitalized at that time. Pacemaker, unable to have MRI. Creatinine was obtained on site at [facility] at [address] Results: Creatinine 0.9 mg/dL. EXAM: CT HEAD WITHOUT AND WITH CONTRAST TECHNIQUE: Contiguous axial images were obtained from the base of the skull through the vertex without and with intravenous contrast CONTRAST:  56mL ISOVUE-300 IOPAMIDOL (ISOVUE-300) INJECTION 61% COMPARISON:  Head CT without contrast 03/23/2017. FINDINGS: Brain: Stable cerebral volume since 2019. Advanced chronic Patchy and confluent bilateral cerebral white matter hypodensity  appears stable. Deep gray matter nuclei, brainstem and cerebellum appear to remain spared. No cortical encephalomalacia identified. No midline shift, ventriculomegaly, mass effect, evidence of mass lesion, intracranial hemorrhage or evidence of cortically based acute infarction. No abnormal enhancement identified. Vascular: Mild Calcified atherosclerosis at the skull base. The major intracranial vascular structures are enhancing as expected. Skull: Stable, negative. Sinuses/Orbits: Partially visible left maxillary sinus retention cyst, otherwise the visible paranasal sinuses, tympanic cavities, and mastoids are clear. Other: Stable orbit and scalp soft tissues. IMPRESSION: Stable CT appearance of advanced cerebral white matter disease since 2019. No new or acute intracranial abnormality identified. Electronically Signed   By: Genevie Ann M.D.   On: 12/19/2019 23:47   DG Chest Port 1 View  Result Date: 12/20/2019 CLINICAL DATA:  Chest pain on the left. EXAM: PORTABLE CHEST 1 VIEW COMPARISON:  11/06/2019 FINDINGS: Previous median sternotomy and CABG. Dual lead pacemaker. Atrial clip. Heart size upper limits of normal. Chronic aortic atherosclerosis. Chronic lung disease with emphysema and scarring. No sign of edema or active infiltrate, collapse or effusion. No acute bone finding. IMPRESSION: No active disease. Previous CABG. Atrial clip. Aortic atherosclerosis. Chronic lung disease. Electronically Signed   By: Nelson Chimes M.D.   On: 12/20/2019 11:50   CT Angio Chest/Abd/Pel for Dissection W and/or W/WO  Result Date: 12/20/2019 CLINICAL DATA:  Chest and back pain EXAM: CT ANGIOGRAPHY CHEST, ABDOMEN AND PELVIS TECHNIQUE: Non-contrast CT of the chest was initially obtained. Multidetector CT imaging through the chest, abdomen and pelvis was performed using the standard protocol during bolus administration of intravenous contrast. Multiplanar reconstructed images and MIPs were obtained and reviewed to evaluate the  vascular anatomy. CONTRAST:  80 mL Omnipaque 350. COMPARISON:  Chest x-ray from earlier in the same day, CT from 11/16/2019. FINDINGS: CTA CHEST FINDINGS Cardiovascular: Thoracic aorta demonstrates atherosclerotic calcifications. Changes of prior coronary bypass grafting are noted. Ascending aorta demonstrates mild prominence at 3.9 cm. No evidence of dissection is noted. Pulmonary artery as visualized is within normal limits. No cardiac enlargement is seen. Mediastinum/Nodes: Thoracic inlet is within normal limits. No hilar or mediastinal adenopathy is noted. The esophagus is within normal limits as visualized. Lungs/Pleura: Mild mosaic attenuation is noted consistent with air trapping. This is similar to that seen on prior CT of the chest. Additionally some mild residual scarring is noted from prior infiltrate. No  acute infiltrate or sizable effusion is seen nodules are noted. Musculoskeletal: Changes of prior median sternotomy are noted. Degenerative changes of the thoracic spine are seen. No acute bony abnormality is noted. Old healed rib fractures are seen on the left. Review of the MIP images confirms the above findings. CTA ABDOMEN AND PELVIS FINDINGS VASCULAR Aorta: Atherosclerotic calcifications are noted. No dissection or aneurysmal dilatation is seen. Celiac: Mild stenosis is noted at the origin. SMA: Patent without evidence of aneurysm, dissection, vasculitis or significant stenosis. Renals: High-grade stenosis of the proximal right renal artery is noted. Left renal artery appears within normal limits. IMA: Patent without evidence of aneurysm, dissection, vasculitis or significant stenosis. Iliacs: Atherosclerotic calcifications are noted without aneurysmal dilatation or focal significant narrowing. Veins: No specific venous abnormality is noted. Review of the MIP images confirms the above findings. NON-VASCULAR Hepatobiliary: Fatty infiltration of the liver is noted. The gallbladder is unremarkable.  Pancreas: Unremarkable. No pancreatic ductal dilatation or surrounding inflammatory changes. Spleen: Normal in size without focal abnormality. Adrenals/Urinary Tract: The adrenal glands demonstrates some nodularity bilaterally. This is stable from the prior exam. Bladder is within normal limits. Stomach/Bowel: Colon shows no obstructive or inflammatory changes. Postsurgical changes are noted in the left colon consistent with the given clinical history. The appendix is visualized without inflammatory change. Small bowel and stomach appear within normal limits. Lymphatic: No significant lymphadenopathy is noted. Reproductive: Prostate is unremarkable. Other: No abdominal wall hernia or abnormality. No abdominopelvic ascites. Musculoskeletal: No acute or significant osseous findings. Review of the MIP images confirms the above findings. IMPRESSION: CTA of the chest: No evidence of thoracic aortic dissection. Mild ectasias of the ascending aorta is noted. Scarring in the lungs stable from prior CT. No acute abnormality is noted. CTA of the abdomen and pelvis: Atherosclerotic calcifications of the abdominal aorta are noted. No dissection or aneurysmal dilatation is seen. 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 Fatty liver. Post surgical changes in the left colon. Electronically Signed   By: Inez Catalina M.D.   On: 12/20/2019 14:47     MDM   Briefly, 79 y/o F with h/o CAD, MI, CABG presenting with 2 day h/o left sided chest pain.   W/u thus far has been reassuring. At shift change pending repeat trop and cardiology consult.   Repeat trop neg.  Cardiology evaluated the patient at bedside. They do not feel that her sxs are consistent with ACS and that she can be discharged home.      Bishop Dublin 12/20/19 [place], Julie, MD 12/20/19 1635

## 2019-12-20 NOTE — ED Notes (Signed)
Patient met with cardiologist and has plan for f/u. She is alert, oriented and verbalized understanding of s/sx to return. Patient with no further questions. No distress noted. PIV removed and pressure and dressing applied for extra minutes d/t blood thinners. Patient ambulated toward exit with steady and even gait.

## 2019-12-20 NOTE — Progress Notes (Signed)
Dr. Audie Box requested 2-3 week follow-up for this patient. Dr. Sallyanne Kuster does not have any availability but I offered to schedule with APP she has seen before. She declined APP appointment at this time and only wishes to see her MDs. She would like to keep pre-existing f/u with Dr. Gwenlyn Found on 01/23/20 whom she sees for PAD. She will reach out if she has any further symptoms in the interim. Otherwise has recall in the system for Dr. Sallyanne Kuster for 02/2020. I will reach out to schedulers to call her to arrange this.  Karon Cotterill PA-C

## 2019-12-20 NOTE — ED Triage Notes (Signed)
Pt sent here from [place] prior to her scans today for eval of L sided chest pain with radiation to L back/shoulder. Same pain occurred when she got out of bed yesterday morning but improved throughout the day and recurred this morning when she got out of bed again. Hx MI and stent placement.

## 2019-12-20 NOTE — Consult Note (Signed)
Cardiology Consultation:   Patient ID: [name]; [id number]; [birth year]   Admit date: 12/20/2019 Date of Consult: 12/20/2019  Primary Care Provider: Horald Pollen, MD Primary Cardiologist: Dr. Sanda Klein Primary Electrophysiologist:  none   Patient Profile:   [name] is a 79 y.o. female with a hx of CAD s/p CABG x3 (2016), NSTEMI (2017) with DES to native RCA and AVG-dRCA, diastolic heart failure (Grade 2), HTN, a fib s/p ablation who is being seen today for the evaluation of chest pain at the request of EDP.  History of Present Illness:   Ms. Mccullar presents with a 24 hour history of left-sided, sharp chest pain that radiates to her back left shoulder blade.  Patient states that the pain abruptly started yesterday morning.  She states that the pain has waxed and waned over the course of the last 24 hours without any associated precipitating factors such as physical or emotional distress.  He denies any alleviating factors.  Patient states that she has had chronic neck pain with radicular symptoms down her left arm.  She states that moving her head will reproduce some of her chest pain symptoms including her left shoulder pain.  She denies any other associated symptoms including diaphoresis, nausea, vomiting, shortness of breath, lower extremity edema, orthopnea.  Past Medical History:  Diagnosis Date  . ANXIETY 12/31/2009  . Aortic insufficiency    a. mod by echo 2017.  Marland Kitchen CAD (coronary artery disease)    a. s/p CABGx3 and LAA clipping in 12/2014, NSTEMI 05/2015 s/p DES to native RCA and SVG-dRCA; occluded ramus-SVG was treated medically). c. neg nuc 10/2015 at [facility].  . Chronic diastolic CHF (congestive heart failure) ([place])   . Chronic fatigue   . CKD (chronic kidney disease), stage II   . DEPRESSION 12/31/2009  . [place] DISEASE, CERVICAL 12/31/2009  . [place] DISEASE, LUMBAR 12/31/2009  . DIVERTICULITIS, HX OF 12/31/2009  . Essential hypertension   . Gait  abnormality 03/25/2017  . GLUCOSE INTOLERANCE 12/31/2009  . Habitual alcohol use   . History of stroke    self reports " they say i have had some pin strokes"   . Hypercalcemia   . Hyperkalemia   . Hyperlipidemia 12/31/2009  . Hypothyroidism   . MENOPAUSE, EARLY 12/31/2009  . NSTEMI (non-ST elevated myocardial infarction) ([place]) 05/11/2015  . Orthostatic hypotension   . OSTEOARTHRITIS, HAND   . Pacemaker 01/21/2012   MDT Adapta dual chamber  . Paroxysmal atrial flutter ([place])   . Persistent atrial fibrillation ([place])   . PVD (peripheral vascular disease), hx stents to bil SFAs 02/2010   . Rash, skin     superficial raised red pencil point sized rash bilateral forearms; states " it started when i started the Plavix "   . Ruptured left breast implant   . Symptomatic sinus bradycardia 01/22/2012  . Syncope    a. 11/2015 ? due to medications.  . Syncope    reports on 05-10-17 " i passed out 2 months ago in the bathroom and broke some ribs"    . Tachy-brady syndrome ([place])    a. s/p MDT PPM 2013.  . Tricuspid regurgitation     Past Surgical History:  Procedure Laterality Date  . ABDOMINAL AORTOGRAM W/LOWER EXTREMITY Bilateral 02/15/2017   Procedure: ABDOMINAL AORTOGRAM W/LOWER EXTREMITY;  Surgeon: Lorretta Harp, MD;  Location: [place] [facility];  Service: Cardiovascular;  Laterality: Bilateral;  bilat  . ABDOMINAL AORTOGRAM W/LOWER EXTREMITY N/A 10/06/2018  Procedure: ABDOMINAL AORTOGRAM W/LOWER EXTREMITY;  Surgeon: Lorretta Harp, MD;  Location: [place] [facility];  Service: Cardiovascular;  Laterality: N/A;  . Ablation of typical atrial flutter (CTI line)  09/05/2016   Dr Antonieta Pert at [facility]   . [place]   Removed 01/2018  . BREAST IMPLANT REMOVAL Bilateral 01/14/2018   Procedure: REMOVAL BILATERAL BREAST IMPLANTS;  Surgeon: Irene Limbo, MD;  Location: [place];  Service: Plastics;  Laterality: Bilateral;  . CAPSULECTOMY Bilateral  01/14/2018   Procedure: CAPSULECTOMY;  Surgeon: Irene Limbo, MD;  Location: [place];  Service: Plastics;  Laterality: Bilateral;  . CARDIAC CATHETERIZATION N/A 01/01/2015   Procedure: Left Heart Cath and Coronary Angiography;  Surgeon: Jettie Booze, MD;  Location: [place] [facility];  Service: Cardiovascular;  Laterality: N/A;  . CARDIAC CATHETERIZATION N/A 05/12/2015   Procedure: Left Heart Cath and Coronary Angiography;  Surgeon: Troy Sine, MD;  Location: [place] [facility];  Service: Cardiovascular;  Laterality: N/A;  . CARDIAC CATHETERIZATION N/A 05/12/2015   Procedure: Coronary Stent Intervention;  Surgeon: Troy Sine, MD;  Location: [place] [facility];  Service: Cardiovascular;  Laterality: N/A;  . CARDIOVERSION N/A 02/01/2015   Procedure: CARDIOVERSION;  Surgeon: Lelon Perla, MD;  Location: [facility] ENDOSCOPY;  Service: Cardiovascular;  Laterality: N/A;  . CARDIOVERSION N/A 08/30/2015   Procedure: CARDIOVERSION;  Surgeon: Sanda Klein, MD;  Location: [place] ENDOSCOPY;  Service: Cardiovascular;  Laterality: N/A;  . CARPAL TUNNEL RELEASE  2008   "right hand/thumb; carpal tunnel repair; got rid of arthritis" (01/21/2012)  . CLIPPING OF ATRIAL APPENDAGE N/A 01/04/2015   Procedure: CLIPPING OF ATRIAL APPENDAGE;  Surgeon: Grace Isaac, MD;  Location: [place];  Service: Open Heart Surgery;  Laterality: N/A;  . COLONOSCOPY N/A 12/21/2015   Procedure: COLONOSCOPY;  Surgeon: Milus Banister, MD;  Location: [facility] ENDOSCOPY;  Service: Endoscopy;  Laterality: N/A;  . CORONARY ARTERY BYPASS GRAFT N/A 01/04/2015   Procedure: CORONARY ARTERY BYPASS GRAFTING (CABG) x 3 using left internal mammory artery and greater saphenous vein right leg harvested endoscopically.;  Surgeon: Grace Isaac, MD;  LIMA-LAD, SVG-RI, SVG-PDA  . ESOPHAGOGASTRODUODENOSCOPY (EGD) WITH PROPOFOL N/A 12/18/2015   Procedure: ESOPHAGOGASTRODUODENOSCOPY (EGD) WITH PROPOFOL;  Surgeon: Milus Banister,  MD;  Location: [facility]OS[facility];  Service: Endoscopy;  Laterality: N/A;  . FACELIFT, LOWER 2/3  1995   "mini" (01/21/2012)  . GIVENS CAPSULE STUDY N/A 12/21/2015   Procedure: GIVENS CAPSULE STUDY;  Surgeon: Milus Banister, MD;  Location: [facility];  Service: Endoscopy;  Laterality: N/A;  . Lower Arterial Examination  10/28/2011   R. SFA stent mild-moderate mixed density plaque with elevated velocities consistent with 50% diameter reduction. L. SFA stent moderate mixed denisty plaque at mid to distal level consistent with 50-69% diameter reduction.  . LOWER EXTREMITY ANGIOGRAPHY N/A 02/15/2017   Procedure: LOWER EXTREMITY ANGIOGRAPHY;  Surgeon: Lorretta Harp, MD;  Location: [place] [facility];  Service: Cardiovascular;  Laterality: N/A;  . OOPHORECTOMY  ~1979  . PARATHYROIDECTOMY N/A 05/13/2017   Procedure: PARATHYROIDECTOMY;  Surgeon: Armandina Gemma, MD;  Location: [facility];  Service: General;  Laterality: N/A;  . PARTIAL COLECTOMY  2010  . PERIPHERAL ARTERIAL STENT GRAFT  2012; 2012   "LLE; RLE" (01/21/2012)  . PERIPHERAL VASCULAR BALLOON ANGIOPLASTY Right 02/15/2017   Procedure: PERIPHERAL VASCULAR BALLOON ANGIOPLASTY;  Surgeon: Lorretta Harp, MD;  Location: [place] [facility];  Service: Cardiovascular;  Laterality: Right;  SFA    . PERIPHERAL VASCULAR INTERVENTION Right 10/06/2018   Procedure: PERIPHERAL VASCULAR INTERVENTION;  Surgeon: Lorretta Harp, MD;  Location: [place] [facility];  Service: Cardiovascular;  Laterality: Right;  . PERMANENT PACEMAKER INSERTION N/A 01/21/2012   Medtronic Adapta L implanted by Dr Sallyanne Kuster for tachy/brady syndrome  . POSTERIOR CERVICAL LAMINECTOMY  1985  . TEE WITHOUT CARDIOVERSION N/A 01/04/2015   Procedure: TRANSESOPHAGEAL ECHOCARDIOGRAM (TEE);  Surgeon: Grace Isaac, MD;  Location: [place];  Service: Open Heart Surgery;  Laterality: N/A;  . TEE WITHOUT CARDIOVERSION N/A 02/01/2015   Procedure: TRANSESOPHAGEAL ECHOCARDIOGRAM (TEE);  Surgeon: Lelon Perla, MD;  Location: [facility] ENDOSCOPY;  Service: Cardiovascular;  Laterality: N/A;  . TEE WITHOUT CARDIOVERSION N/A 08/30/2015   Procedure: TRANSESOPHAGEAL ECHOCARDIOGRAM (TEE);  Surgeon: Sanda Klein, MD;  Location: [facility] ENDOSCOPY;  Service: Cardiovascular;  Laterality: N/A;  . [place] Medications:  Prior to Admission medications   Medication Sig Start Date End Date Taking? Authorizing Provider  albuterol (VENTOLIN HFA) 108 (90 Base) MCG/ACT inhaler Inhale 1-2 puffs into the lungs every 6 (six) hours as needed for wheezing or shortness of breath. 09/05/19  Yes Darr, Edison Nasuti, PA-C  alendronate (FOSAMAX) 70 MG tablet Take 70 mg by mouth every Saturday. 11/27/19  Yes [provider]  amLODipine (NORVASC) 5 MG tablet TAKE 1.5 TABLETS (7.5 MG TOTAL) BY MOUTH DAILY. Patient taking differently: Take 2.5-5 mg by mouth See admin instructions. Take 5mg  in the morning and 2.5mg  in the evening. 08/30/19  Yes Lorretta Harp, MD  aspirin EC 81 MG tablet Take 81 mg by mouth daily.    Yes [provider]  Cholecalciferol (VITAMIN D3 PO) Take 1 tablet by mouth daily.   Yes [provider]  clopidogrel (PLAVIX) 75 MG tablet TAKE 1 TABLET (75 MG TOTAL) BY MOUTH DAILY WITH BREAKFAST. 10/09/19  Yes Lorretta Harp, MD  dofetilide (TIKOSYN) 250 MCG capsule Take 1 capsule (250 mcg total) by mouth 2 (two) times daily. 04/06/19  Yes Croitoru, Mihai, MD  furosemide (LASIX) 40 MG tablet Take 40 mg by mouth daily as needed for fluid or edema.    Yes [provider]  levothyroxine (SYNTHROID, LEVOTHROID) 25 MCG tablet Take 25 mcg by mouth daily before breakfast.  04/03/16  Yes [provider]  Multiple Vitamins-Minerals (MULTI-VITE) LIQD Take 1 tablet by mouth daily.    Yes [provider]  nitroGLYCERIN (NITROSTAT) 0.4 MG SL tablet Place 1 tablet (0.4 mg total) under the tongue every 5 (five) minutes x 3 doses as needed for chest pain. 05/15/15   Yes Simmons, Brittainy M, PA-C  Omega-3 Fatty Acids (OMEGA 3 PO) Take 690 mg by mouth daily.   Yes [provider]  rosuvastatin (CRESTOR) 10 MG tablet Take 1 tablet (10 mg total) by mouth daily. 11/22/19  Yes Sagardia, Ines Bloomer, MD  TURMERIC PO Take 475 mg by mouth daily.   Yes [provider]  vitamin C (ASCORBIC ACID) 500 MG tablet Take 500 mg by mouth daily.   Yes [provider]  citalopram (CELEXA) 40 MG tablet TAKE 1 TABLET BY MOUTH EVERY DAY Patient taking differently: Take 40 mg by mouth daily.  10/09/19   Horald Pollen, MD    Inpatient Medications: Scheduled Meds:  Continuous Infusions:  PRN Meds: nitroGLYCERIN  Allergies:    Allergies  Allergen Reactions  . Brilinta [Ticagrelor] Shortness Of Breath  . Atorvastatin Other (See Comments)  Possible cause of fatigue/malaise  . Clonidine Derivatives Other (See Comments)    Lowers heart rate  . Crestor [Rosuvastatin Calcium] Other (See Comments)    Joint/muscle aches  . Exforge [Amlodipine Besylate-Valsartan] Itching and Rash  . Spironolactone Other (See Comments)    Contraindicated with history of hyperkalemia    Social History:   Social History   Socioeconomic History  . Marital status: Widowed    Spouse name: Not on file  . Number of children: 3  . Years of education: Not on file  . Highest education level: Not on file  Occupational History  . 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 Smoking status: Former Smoker    Packs/day: 0.75    Years: 10.00    Pack years: 7.50    Types: Cigarettes    Quit date: 2008    Years since quitting: 13.8  . Smokeless tobacco: Never Used  . Tobacco comment: 01/21/2012 "quit smoking ~ 2002"  Vaping Use  . Vaping Use: Never used  Substance and Sexual Activity  . Alcohol use: Yes    Comment: daily coctail  . Drug use: No  . Sexual activity: Not Currently  Other Topics Concern  . Not on file  Social  History Narrative   Lives in [place].  Retired.   Social Determinants of Health   Financial Resource Strain:   . Difficulty of Paying Living Expenses: Not on file  Food Insecurity:   . Worried About Charity fundraiser in the Last Year: Not on file  . Ran Out of Food in the Last Year: Not on file  Transportation Needs:   . Lack of Transportation (Medical): Not on file  . Lack of Transportation (Non-Medical): Not on file  Physical Activity:   . Days of Exercise per Week: Not on file  . Minutes of Exercise per Session: Not on file  Stress:   . Feeling of Stress : Not on file  Social Connections:   . Frequency of Communication with Friends and Family: Not on file  . Frequency of Social Gatherings with Friends and Family: Not on file  . Attends Religious Services: Not on file  . Active Member of Clubs or Organizations: Not on file  . Attends Archivist Meetings: Not on file  . Marital Status: Not on file  Intimate Partner Violence:   . Fear of Current or Ex-Partner: Not on file  . Emotionally Abused: Not on file  . Physically Abused: Not on file  . Sexually Abused: Not on file    Family History:   Family History  Problem Relation Age of Onset  . Heart disease Mother   . Hypertension Mother   . Stroke Mother   . Stroke Father   . Heart disease Father   . Heart disease Brother   . Hypertension Brother        2 brothers  . CVA Brother        2 brothers  . Heart attack Brother        2 brothers     ROS:  Please see the history of present illness.  ROS - All other ROS reviewed and negative.     Physical Exam/Data:   Vitals:   12/20/19 1330 12/20/19 1345 12/20/19 1400 12/20/19 1500  BP: (!) 143/68 (!) 141/83 (!) 143/78 133/74  Pulse: 74 76 73 75  Resp: 18 15 20 18   Temp:      TempSrc:  SpO2: 96% 96% 96% 95%   No intake or output data in the 24 hours ending 12/20/19 1631 There were no vitals filed for this visit. There is no height or weight on  file to calculate BMI.  General:  Well nourished, well developed, in no acute distress HEENT: normal Lymph: no adenopathy Neck: no JVD or hepatojugular reflux  Endocrine:  No thryomegaly Vascular: No carotid bruits; FA pulses 2+ bilaterally without bruits  Cardiac:  normal S1, S2; RRR; no murmur, rubs or gallops  Lungs:  clear to auscultation bilaterally, no wheezing, rhonchi or rales  Abd: soft, nontender, no hepatomegaly  Ext: no edema Musculoskeletal: Patient has 5 out of 5 strength in bilateral upper and lower extremities bilaterally.  Left neck and shoulder pain with decreased range of motion of her head with a left lateral rotation secondary to pain Skin: warm and dry  Neuro:  CNs 2-12 intact, no focal abnormalities noted Psych:  Normal affect   EKG:  The EKG was personally reviewed and demonstrates:  Sinus rhythm with atrial pacer spikes Telemetry:  Telemetry was personally reviewed and demonstrates:  Sinus rhythm with atrial pacer spikes  Relevant CV Studies:  LEFT Heart Cath: (2017)   Mid LAD lesion, 85% stenosed.  Ramus lesion, 80% stenosed.  Prox Cx to Mid Cx lesion, 40% stenosed.  SVG was injected is normal in caliber.  There is severe disease in the graft.  Ostial occlusion of SVG to the ramus intermediate vessel.  Origin to Prox Graft lesion, 100% stenosed.  LIMA was injected is normal in caliber, and is anatomically normal.  There is competitive flow.  Widely patent LIMA graft which supplies the mid LAD.  SVG was injected is large.  There is severe disease in the graft.  Severely diseased proximal to mid portion of the vein graft which supplied the PDA vessel. Of note, the distal RCA. Antegrade to the PDA was totally occluded and did not supply the proximal to mid RCA due to this occlusion.  Prox Graft lesion, 70% stenosed.  Dist RCA-1 lesion, 100% stenosed.  Dist RCA-2 lesion, 100% stenosed.  Origin lesion, 95% stenosed. Post intervention, there  is a 0% residual stenosis.  Prox RCA lesion, 95% stenosed. Post intervention, there is a 0% residual stenosis.  There is moderate left ventricular systolic dysfunction.   Significant native multivessel CAD with diffuse 85% proximal LAD stenosis with evidence for competitive filling via the LIMA graft supplying the mid LAD; 75-80% diffuse proximal ramus intermediate stenosis with evidence for an occluded  vein graft anastomosed in its midst portion of the vessel; 40% left circumflex stenosis, and 95% proximal RCA stenosis which supplies 2 moderate size marginal branches prior to being totally occluded in the region of the acute margin/distal RCA.  Severely diseased SVG which supplies a large distal RCA system giving rise to several branches and anastomosing into the PDA.  The native distal RCA is occluded a short distance prior to the PDA takeoff and the graft does not supply the proximal - mid native vessel due to this occlusion.    Occluded SVG at its origin to the ramus intermediate vessel.  Patent LIMA graft supplying the mid LAD.  Successful percutaneous coronary intervention of the proximal to mid vein graft supplying the large distal RCA with ultimate insertion of a 2.7538 mm Synergy DES stent postdilated to 2.91 mm with the 95 and diffuse 70% stenosis being reduced to 0%.  Successful percutaneous coronary intervention to the proximal native RCA which  supplies 2 moderate size marginal branches prior to the acute margin/ distal RCA occlusion with PTCA/ 3.020 mm Synergy DES stent postdilated to 3.25 mm with the 95% stenosis being reduced to 0%.  Echocardiogram (2018) - Left ventricle: The cavity size was normal. Wall thickness was  normal. Systolic function was normal. The estimated ejection  fraction was in the range of 55% to 60%. Wall motion was normal;  there were no regional wall motion abnormalities. Features are  consistent with a pseudonormal left ventricular filling  pattern,  with concomitant abnormal relaxation and increased filling  pressure (grade 2 diastolic dysfunction).  - Aortic valve: Mildly thickened, mildly calcified leaflets. There  was mild regurgitation.  - Left atrium: The atrium was mildly dilated.  - Right ventricle: The cavity size was moderately dilated. Systolic  function was mildly reduced.  - Right atrium: The atrium was severely dilated.   Echocardiogram (2021) 1. Left ventricular ejection fraction, by estimation, is 60 to 65%. The  left ventricle has normal function. The left ventricle has no regional  wall motion abnormalities. Left ventricular diastolic parameters are  consistent with Grade II diastolic  dysfunction (pseudonormalization). Elevated left atrial pressure. The  average left ventricular global longitudinal strain is -20.0 %. The global  longitudinal strain is normal.  2. Right ventricular systolic function is normal. The right ventricular  size is normal. There is mildly elevated pulmonary artery systolic  pressure. The estimated right ventricular systolic pressure is 01.6 mmHg.  3. Left atrial size was mildly dilated.  4. Right atrial size was mildly dilated.  5. The mitral valve is normal in structure. Mild mitral valve  regurgitation. No evidence of mitral stenosis.  6. Tricuspid valve regurgitation is moderate.  7. The aortic valve is normal in structure. Aortic valve regurgitation is  mild. No aortic stenosis is present.  8. The inferior vena cava is dilated in size with >50% respiratory  variability, suggesting right atrial pressure of 8 mmHg.   Laboratory Data:  Chemistry Recent Labs  Lab 12/20/19 1122  NA 136  K 4.6  CL 102  CO2 23  GLUCOSE 123*  BUN 13  CREATININE 0.83  CALCIUM 9.4  GFRNONAA >60  ANIONGAP 11    Recent Labs  Lab 12/20/19 1122  PROT 7.1  ALBUMIN 4.2  AST 21  ALT 15  ALKPHOS 75  BILITOT 0.9   Hematology Recent Labs  Lab 12/20/19 1122  WBC  6.7  RBC 4.82  HGB 14.3  HCT 43.3  MCV 89.8  MCH 29.7  MCHC 33.0  RDW 13.2  PLT 301   Cardiac Enzymes: 6>6 BNP: n/a DDimer:n/a  Radiology/Studies:  CT HEAD W & WO CONTRAST  Result Date: 12/19/2019 CLINICAL DATA:  79 year old female status post COVID-19 in August. Left hand and arm numbness and weakness while hospitalized at that time. Pacemaker, unable to have MRI. Creatinine was obtained on site at [place] at [address] Results: Creatinine 0.9 mg/dL. EXAM: CT HEAD WITHOUT AND WITH CONTRAST TECHNIQUE: Contiguous axial images were obtained from the base of the skull through the vertex without and with intravenous contrast CONTRAST:  61mL ISOVUE-300 IOPAMIDOL (ISOVUE-300) INJECTION 61% COMPARISON:  Head CT without contrast 03/23/2017. FINDINGS: Brain: Stable cerebral volume since 2019. Advanced chronic Patchy and confluent bilateral cerebral white matter hypodensity appears stable. Deep gray matter nuclei, brainstem and cerebellum appear to remain spared. No cortical encephalomalacia identified. No midline shift, ventriculomegaly, mass effect, evidence of mass lesion, intracranial hemorrhage or evidence of cortically  based acute infarction. No abnormal enhancement identified. Vascular: Mild Calcified atherosclerosis at the skull base. The major intracranial vascular structures are enhancing as expected. Skull: Stable, negative. Sinuses/Orbits: Partially visible left maxillary sinus retention cyst, otherwise the visible paranasal sinuses, tympanic cavities, and mastoids are clear. Other: Stable orbit and scalp soft tissues. IMPRESSION: Stable CT appearance of advanced cerebral white matter disease since 2019. No new or acute intracranial abnormality identified. Electronically Signed   By: Genevie Ann M.D.   On: 12/19/2019 23:47   DG Chest Port 1 View  Result Date: 12/20/2019 CLINICAL DATA:  Chest pain on the left. EXAM: PORTABLE CHEST 1 VIEW COMPARISON:  11/06/2019 FINDINGS:  Previous median sternotomy and CABG. Dual lead pacemaker. Atrial clip. Heart size upper limits of normal. Chronic aortic atherosclerosis. Chronic lung disease with emphysema and scarring. No sign of edema or active infiltrate, collapse or effusion. No acute bone finding. IMPRESSION: No active disease. Previous CABG. Atrial clip. Aortic atherosclerosis. Chronic lung disease. Electronically Signed   By: Nelson Chimes M.D.   On: 12/20/2019 11:50   CT Angio Chest/Abd/Pel for Dissection W and/or W/WO  Result Date: 12/20/2019 CLINICAL DATA:  Chest and back pain EXAM: CT ANGIOGRAPHY CHEST, ABDOMEN AND PELVIS TECHNIQUE: Non-contrast CT of the chest was initially obtained. Multidetector CT imaging through the chest, abdomen and pelvis was performed using the standard protocol during bolus administration of intravenous contrast. Multiplanar reconstructed images and MIPs were obtained and reviewed to evaluate the vascular anatomy. CONTRAST:  80 mL Omnipaque 350. COMPARISON:  Chest x-ray from earlier in the same day, CT from 11/16/2019. FINDINGS: CTA CHEST FINDINGS Cardiovascular: Thoracic aorta demonstrates atherosclerotic calcifications. Changes of prior coronary bypass grafting are noted. Ascending aorta demonstrates mild prominence at 3.9 cm. No evidence of dissection is noted. Pulmonary artery as visualized is within normal limits. No cardiac enlargement is seen. Mediastinum/Nodes: Thoracic inlet is within normal limits. No hilar or mediastinal adenopathy is noted. The esophagus is within normal limits as visualized. Lungs/Pleura: Mild mosaic attenuation is noted consistent with air trapping. This is similar to that seen on prior CT of the chest. Additionally some mild residual scarring is noted from prior infiltrate. No acute infiltrate or sizable effusion is seen nodules are noted. Musculoskeletal: Changes of prior median sternotomy are noted. Degenerative changes of the thoracic spine are seen. No acute bony  abnormality is noted. Old healed rib fractures are seen on the left. Review of the MIP images confirms the above findings. CTA ABDOMEN AND PELVIS FINDINGS VASCULAR Aorta: Atherosclerotic calcifications are noted. No dissection or aneurysmal dilatation is seen. Celiac: Mild stenosis is noted at the origin. SMA: Patent without evidence of aneurysm, dissection, vasculitis or significant stenosis. Renals: High-grade stenosis of the proximal right renal artery is noted. Left renal artery appears within normal limits. IMA: Patent without evidence of aneurysm, dissection, vasculitis or significant stenosis. Iliacs: Atherosclerotic calcifications are noted without aneurysmal dilatation or focal significant narrowing. Veins: No specific venous abnormality is noted. Review of the MIP images confirms the above findings. NON-VASCULAR Hepatobiliary: Fatty infiltration of the liver is noted. The gallbladder is unremarkable. Pancreas: Unremarkable. No pancreatic ductal dilatation or surrounding inflammatory changes. Spleen: Normal in size without focal abnormality. Adrenals/Urinary Tract: The adrenal glands demonstrates some nodularity bilaterally. This is stable from the prior exam. Bladder is within normal limits. Stomach/Bowel: Colon shows no obstructive or inflammatory changes. Postsurgical changes are noted in the left colon consistent with the given clinical history. The appendix is visualized without inflammatory change. Small bowel  and stomach appear within normal limits. Lymphatic: No significant lymphadenopathy is noted. Reproductive: Prostate is unremarkable. Other: No abdominal wall hernia or abnormality. No abdominopelvic ascites. Musculoskeletal: No acute or significant osseous findings. Review of the MIP images confirms the above findings. IMPRESSION: CTA of the chest: No evidence of thoracic aortic dissection. Mild ectasias of the ascending aorta is noted. Scarring in the lungs stable from prior CT. No acute  abnormality is noted. CTA of the abdomen and pelvis: Atherosclerotic calcifications of the abdominal aorta are noted. No dissection or aneurysmal dilatation is seen. 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 Fatty liver. Post surgical changes in the left colon. Electronically Signed   By: Inez Catalina M.D.   On: 12/20/2019 14:47    Assessment and Plan:   1. Noncardiac chest pain: Cardiology consulted for noncardiac chest pain.  Although patient has significant cardiovascular history consisting of CABG x3 with subsequent NSTEMI, her pain is not consistent of typical cardiac chest pain and appears to be associated with her neck pain with left upper extremity radiculopathy.  Patient's EKG does not show any signs of ischemic changes.  Patient's initial troponin was negative at 6 with stable follow-up troponin of 6.  Patient was further evaluated with a CT angiogram to assess for aortic dissection.  Patient was found to have some renal artery stenosis but otherwise no evidence of aneurysm or dissection.  There is no emergent need for this patient to be admitted for further evaluation and management of chest pain.  I will get her a follow-up with her outpatient cardiologist within the upcoming weeks to determine her need for a cardiac stress test.  -Follow-up with outpatient cardiologist in the coming weeks.  2.  CAD status post CABG x3 complicated by subsequent NSTEMI with drug-eluting stent to RCA and distal RCA venous graft.  -Continue risk factor modification including blood pressure control with amlodipine 5 mg -Continue rosuvastatin 10 mg daily -Continue antiplatelet therapy for secondary prophylaxis of CAD  3.  Diastolic heart failure: -Continue furosemide 40 mg daily  For questions or updates, please contact [place] Please consult [email or web address] for contact info under Cardiology/STEMI.   Signed, Marianna Payment, MD  12/20/2019 4:31 PM

## 2019-12-20 NOTE — Discharge Instructions (Signed)
Myelogram Discharge Instructions  1. Go home and rest quietly for the next 24 hours.  It is important to lie flat for the next 24 hours.  Get up only to go to the restroom.  You may lie in the bed or on a couch on your back, your stomach, your left side or your right side.  You may have one pillow under your head.  You may have pillows between your knees while you are on your side or under your knees while you are on your back.  2. DO NOT drive today.  Recline the seat as far back as it will go, while still wearing your seat belt, on the way home.  3. You may get up to go to the bathroom as needed.  You may sit up for 10 minutes to eat.  You may resume your normal diet and medications unless otherwise indicated.  Drink lots of extra fluids today and tomorrow.  4. The incidence of headache, nausea, or vomiting is about 5% (one in 20 patients).  If you develop a headache, lie flat and drink plenty of fluids until the headache goes away.  Caffeinated beverages may be helpful.  If you develop severe nausea and vomiting or a headache that does not go away with flat bed rest, call [phone].  5. You may resume normal activities after your 24 hours of bed rest is over; however, do not exert yourself strongly or do any heavy lifting tomorrow. If when you get up you have a headache when standing, go back to bed and force fluids for another 24 hours.  6. Call your physician for a follow-up appointment.  The results of your myelogram will be sent directly to your physician by the following day.  7. If you have any questions or if complications develop after you arrive home, please call [phone].  Discharge instructions have been explained to the patient.  The patient, or the person responsible for the patient, fully understands these instructions  YOU MAY Meyer 12/21/2019 AT 1030AM

## 2019-12-21 ENCOUNTER — Telehealth: Payer: Self-pay | Admitting: *Deleted

## 2019-12-21 NOTE — Telephone Encounter (Signed)
Patient declined hospital follow up at this time has a few test scheduled wants to have these test done.

## 2019-12-27 ENCOUNTER — Ambulatory Visit
Admission: RE | Admit: 2019-12-27 | Discharge: 2019-12-27 | Disposition: A | Discharge status: Home / Self Care | Payer: Medicare HMO | Source: Ambulatory Visit | Attending: Physician Assistant | Admitting: Physician Assistant

## 2019-12-27 ENCOUNTER — Other Ambulatory Visit: Payer: Self-pay | Admitting: Physician Assistant

## 2019-12-27 ENCOUNTER — Other Ambulatory Visit: Payer: Self-pay

## 2019-12-27 ENCOUNTER — Inpatient Hospital Stay
Admission: RE | Admit: 2019-12-27 | Discharge: 2019-12-27 | Disposition: A | Discharge status: Home / Self Care | Payer: Medicare HMO | Source: Ambulatory Visit | Attending: Physician Assistant | Admitting: Physician Assistant

## 2019-12-27 VITALS — BP 198/94 | HR 83

## 2019-12-27 DIAGNOSIS — M4312 Spondylolisthesis, cervical region: Secondary | ICD-10-CM | POA: Diagnosis not present

## 2019-12-27 DIAGNOSIS — R200 Anesthesia of skin: Secondary | ICD-10-CM | POA: Diagnosis not present

## 2019-12-27 DIAGNOSIS — R279 Unspecified lack of coordination: Secondary | ICD-10-CM

## 2019-12-27 DIAGNOSIS — M5030 Other cervical disc degeneration, unspecified cervical region: Secondary | ICD-10-CM

## 2019-12-27 DIAGNOSIS — M50221 Other cervical disc displacement at C4-C5 level: Secondary | ICD-10-CM | POA: Diagnosis not present

## 2019-12-27 DIAGNOSIS — M4802 Spinal stenosis, cervical region: Secondary | ICD-10-CM | POA: Diagnosis not present

## 2019-12-27 DIAGNOSIS — M4319 Spondylolisthesis, multiple sites in spine: Secondary | ICD-10-CM | POA: Diagnosis not present

## 2019-12-27 MED ORDER — DIAZEPAM 5 MG PO TABS
5.0000 mg | ORAL_TABLET | Freq: Once | ORAL | Status: AC
  Administered 2019-12-27: 5 mg via ORAL

## 2019-12-27 MED ORDER — IOPAMIDOL (ISOVUE-M 300) INJECTION 61%: 10.0000 mL | Freq: Once | INTRAMUSCULAR | Status: DC | PRN

## 2019-12-27 NOTE — Discharge Instructions (Signed)
Myelogram Discharge Instructions  1. Go home and rest quietly for the next 24 hours.  It is important to lie flat for the next 24 hours.  Get up only to go to the restroom.  You may lie in the bed or on a couch on your back, your stomach, your left side or your right side.  You may have one pillow under your head.  You may have pillows between your knees while you are on your side or under your knees while you are on your back.  2. DO NOT drive today.  Recline the seat as far back as it will go, while still wearing your seat belt, on the way home.  3. You may get up to go to the bathroom as needed.  You may sit up for 10 minutes to eat.  You may resume your normal diet and medications unless otherwise indicated.  Drink plenty of extra fluids today and tomorrow.  4. The incidence of a spinal headache with nausea and/or vomiting is about 5% (one in 20 patients).  If you develop a headache, lie flat and drink plenty of fluids until the headache goes away.  Caffeinated beverages may be helpful.  If you develop severe nausea and vomiting or a headache that does not go away with flat bed rest, call [phone].  5. You may resume normal activities after your 24 hours of bed rest is over; however, do not exert yourself strongly or do any heavy lifting tomorrow.  6. Call your physician for a follow-up appointment.    You may resume Celexa on Thursday, December 28, 2019, after 10:30a.m.

## 2019-12-27 NOTE — Discharge Instructions (Signed)
Myelogram Discharge Instructions  1. Go home and rest quietly for the next 24 hours.  It is important to lie flat for the next 24 hours.  Get up only to go to the restroom.  You may lie in the bed or on a couch on your back, your stomach, your left side or your right side.  You may have one pillow under your head.  You may have pillows between your knees while you are on your side or under your knees while you are on your back.  2. DO NOT drive today.  Recline the seat as far back as it will go, while still wearing your seat belt, on the way home.  3. You may get up to go to the bathroom as needed.  You may sit up for 10 minutes to eat.  You may resume your normal diet and medications unless otherwise indicated.  Drink lots of extra fluids today and tomorrow.  4. The incidence of headache, nausea, or vomiting is about 5% (one in 20 patients).  If you develop a headache, lie flat and drink plenty of fluids until the headache goes away.  Caffeinated beverages may be helpful.  If you develop severe nausea and vomiting or a headache that does not go away with flat bed rest, call [phone].  5. You may resume normal activities after your 24 hours of bed rest is over; however, do not exert yourself strongly or do any heavy lifting tomorrow. If when you get up you have a headache when standing, go back to bed and force fluids for another 24 hours.  6. Call your physician for a follow-up appointment.  The results of your myelogram will be sent directly to your physician by the following day.  7. If you have any questions or if complications develop after you arrive home, please call [phone].  Discharge instructions have been explained to the patient.  The patient, or the person responsible for the patient, fully understands these instructions  YOU MAY [place] 12/28/19 AT 930AM

## 2019-12-27 NOTE — Progress Notes (Signed)
Patient states she has been off Celexa for at least the past two days. 

## 2020-01-01 ENCOUNTER — Encounter: Payer: Self-pay | Admitting: Emergency Medicine

## 2020-01-01 ENCOUNTER — Ambulatory Visit (INDEPENDENT_AMBULATORY_CARE_PROVIDER_SITE_OTHER): Payer: Medicare HMO | Admitting: Emergency Medicine

## 2020-01-01 ENCOUNTER — Other Ambulatory Visit: Payer: Self-pay

## 2020-01-01 VITALS — BP 131/78 | HR 84 | Temp 98.1°F | Resp 16 | Ht 64.0 in | Wt 153.0 lb

## 2020-01-01 DIAGNOSIS — J449 Chronic obstructive pulmonary disease, unspecified: Secondary | ICD-10-CM

## 2020-01-01 DIAGNOSIS — F3342 Major depressive disorder, recurrent, in full remission: Secondary | ICD-10-CM

## 2020-01-01 DIAGNOSIS — M5010 Cervical disc disorder with radiculopathy, unspecified cervical region: Secondary | ICD-10-CM | POA: Diagnosis not present

## 2020-01-01 DIAGNOSIS — G933 Postviral fatigue syndrome: Secondary | ICD-10-CM

## 2020-01-01 DIAGNOSIS — J1282 Pneumonia due to coronavirus disease 2019: Secondary | ICD-10-CM | POA: Diagnosis not present

## 2020-01-01 DIAGNOSIS — U071 COVID-19: Secondary | ICD-10-CM

## 2020-01-01 DIAGNOSIS — M542 Cervicalgia: Secondary | ICD-10-CM | POA: Diagnosis not present

## 2020-01-01 DIAGNOSIS — I130 Hypertensive heart and chronic kidney disease with heart failure and stage 1 through stage 4 chronic kidney disease, or unspecified chronic kidney disease: Secondary | ICD-10-CM | POA: Diagnosis not present

## 2020-01-01 DIAGNOSIS — G9331 Postviral fatigue syndrome: Secondary | ICD-10-CM

## 2020-01-01 DIAGNOSIS — R69 Illness, unspecified: Secondary | ICD-10-CM | POA: Diagnosis not present

## 2020-01-01 DIAGNOSIS — Z950 Presence of cardiac pacemaker: Secondary | ICD-10-CM | POA: Diagnosis not present

## 2020-01-01 DIAGNOSIS — Z8679 Personal history of other diseases of the circulatory system: Secondary | ICD-10-CM | POA: Diagnosis not present

## 2020-01-01 DIAGNOSIS — I700 Atherosclerosis of aorta: Secondary | ICD-10-CM | POA: Diagnosis not present

## 2020-01-01 DIAGNOSIS — M47812 Spondylosis without myelopathy or radiculopathy, cervical region: Secondary | ICD-10-CM | POA: Diagnosis not present

## 2020-01-01 NOTE — Progress Notes (Signed)
[name] 79 y.o.   Chief Complaint  Patient presents with  . post viral syndrome    follow up 6 weeks    HISTORY OF PRESENT ILLNESS: This is a 79 y.o. female here for 4-week follow-up. Status post Covid pneumonia.  Doing well.  No respiratory symptoms. Had episode of chest pain 12/21/2019 and went to emergency department.  Negative work-up.  Noncardiac origin of pain. CT of chest and chest x-ray unremarkable. Feeling much better. Pecan pie.  HPI   Prior to Admission medications   Medication Sig Start Date End Date Taking? Authorizing Provider  albuterol (VENTOLIN HFA) 108 (90 Base) MCG/ACT inhaler Inhale 1-2 puffs into the lungs every 6 (six) hours as needed for wheezing or shortness of breath. 09/05/19   Darr, Edison Nasuti, PA-C  alendronate (FOSAMAX) 70 MG tablet Take 70 mg by mouth every Saturday. 11/27/19   [provider]  amLODipine (NORVASC) 5 MG tablet TAKE 1.5 TABLETS (7.5 MG TOTAL) BY MOUTH DAILY. Patient taking differently: Take 2.5-5 mg by mouth See admin instructions. Take 5mg  in the morning and 2.5mg  in the evening. 08/30/19   Lorretta Harp, MD  aspirin EC 81 MG tablet Take 81 mg by mouth daily.     [provider]  Cholecalciferol (VITAMIN D3 PO) Take 1 tablet by mouth daily.    [provider]  citalopram (CELEXA) 40 MG tablet TAKE 1 TABLET BY MOUTH EVERY DAY Patient taking differently: Take 40 mg by mouth daily.  10/09/19   Horald Pollen, MD  clopidogrel (PLAVIX) 75 MG tablet TAKE 1 TABLET (75 MG TOTAL) BY MOUTH DAILY WITH BREAKFAST. 10/09/19   Lorretta Harp, MD  dofetilide (TIKOSYN) 250 MCG capsule Take 1 capsule (250 mcg total) by mouth 2 (two) times daily. 04/06/19   Croitoru, Mihai, MD  furosemide (LASIX) 40 MG tablet Take 40 mg by mouth daily as needed for fluid or edema.     [provider]  levothyroxine (SYNTHROID, LEVOTHROID) 25 MCG tablet Take 25 mcg by mouth daily before breakfast.  04/03/16   [provider]  Multiple Vitamins-Minerals (MULTI-VITE) LIQD Take 1 tablet by mouth daily.     [provider]  nitroGLYCERIN (NITROSTAT) 0.4 MG SL tablet Place 1 tablet (0.4 mg total) under the tongue every 5 (five) minutes x 3 doses as needed for chest pain. 05/15/15   Lyda Jester M, PA-C  Omega-3 Fatty Acids (OMEGA 3 PO) Take 690 mg by mouth daily.    [provider]  rosuvastatin (CRESTOR) 10 MG tablet Take 1 tablet (10 mg total) by mouth daily. 11/22/19   Horald Pollen, MD  TURMERIC PO Take 475 mg by mouth daily.    [provider]  vitamin C (ASCORBIC ACID) 500 MG tablet Take 500 mg by mouth daily.    [provider]    Allergies  Allergen Reactions  . Brilinta [Ticagrelor] Shortness Of Breath  . Atorvastatin Other (See Comments)    Possible cause of fatigue/malaise  . Clonidine Derivatives Other (See Comments)    Lowers heart rate  . Crestor [Rosuvastatin Calcium] Other (See Comments)    Joint/muscle aches  . Exforge [Amlodipine Besylate-Valsartan] Itching and Rash  . Spironolactone Other (See Comments)    Contraindicated with history of hyperkalemia    Patient Active Problem List   Diagnosis Date Noted  . Statin intolerance 11/22/2019  . Aortic atherosclerosis ([place]) 11/22/2019  . Acute respiratory failure due to COVID-19 ([place]) 10/10/2019  . Hyponatremia  10/10/2019  . Hypothyroidism (acquired) 02/08/2018  . Coronary artery disease involving coronary bypass graft of native heart without angina pectoris 10/22/2017  . Dyslipidemia 10/22/2017  . Cerebrovascular small vessel disease 04/27/2017  . Gait abnormality 03/25/2017  . Cerebrovascular disease 03/25/2017  . Claudication in peripheral vascular disease ([place]) 02/15/2017  . Hyperparathyroidism ([place]) 05/13/2016  . Pacemaker 01/10/2016  . Benign neoplasm of ascending colon   . Long term current use of anticoagulant   . CKD (chronic kidney disease), stage II   . H/O  amiodarone therapy 12/04/2015  . Chronic diastolic heart failure ([place]) 10/19/2015  . Atypical atrial flutter ([place])   . Paroxysmal atrial flutter ([place]) 08/23/2015  . Chronic fatigue 06/27/2015  . Stented coronary artery   . S/P CABG x 3 01/04/15 01/10/2015  . CAD (coronary artery disease) 01/04/2015  . PAF (paroxysmal atrial fibrillation) ([place]) 01/22/2012  . Sinus node dysfunction ([place]) 01/22/2012  . S/P placement of cardiac pacemaker, medtronic adapta 01/21/12 01/22/2012  . PVD (peripheral vascular disease), hx stents to bil SFAs 02/2010 01/22/2012  . Hyperlipidemia 12/31/2009  . Anxiety 12/31/2009  . DEPRESSION 12/31/2009  . Essential hypertension 12/31/2009  . Coronary atherosclerosis 12/31/2009  . OSTEOARTHRITIS, HAND 12/31/2009  . [place] DISEASE, CERVICAL 12/31/2009  . [place] DISEASE, LUMBAR 12/31/2009  . UNSPECIFIED URINARY INCONTINENCE 12/31/2009    Past Medical History:  Diagnosis Date  . ANXIETY 12/31/2009  . Aortic insufficiency    a. mod by echo 2017.  [place] Kitchen CAD (coronary artery disease)    a. s/p CABGx3 and LAA clipping in 12/2014, NSTEMI 05/2015 s/p DES to native RCA and SVG-dRCA; occluded ramus-SVG was treated medically). c. neg nuc 10/2015 at [facility].  . Chronic diastolic CHF (congestive heart failure) ([place])   . Chronic fatigue   . CKD (chronic kidney disease), stage II   . DEPRESSION 12/31/2009  . [place] DISEASE, CERVICAL 12/31/2009  . [place] DISEASE, LUMBAR 12/31/2009  . DIVERTICULITIS, HX OF 12/31/2009  . Essential hypertension   . Gait abnormality 03/25/2017  . GLUCOSE INTOLERANCE 12/31/2009  . Habitual alcohol use   . History of stroke    self reports " they say i have had some pin strokes"   . Hypercalcemia   . Hyperkalemia   . Hyperlipidemia 12/31/2009  . Hypothyroidism   . MENOPAUSE, EARLY 12/31/2009  . NSTEMI (non-ST elevated myocardial infarction) ([place]) 05/11/2015  . Orthostatic hypotension   . OSTEOARTHRITIS, HAND   . Pacemaker 01/21/2012   MDT Adapta dual  chamber  . Paroxysmal atrial flutter ([place])   . Persistent atrial fibrillation ([place])   . PVD (peripheral vascular disease), hx stents to bil SFAs 02/2010   . Rash, skin     superficial raised red pencil point sized rash bilateral forearms; states " it started when i started the Plavix "   . Ruptured left breast implant   . Symptomatic sinus bradycardia 01/22/2012  . Syncope    a. 11/2015 ? due to medications.  . Syncope    reports on 05-10-17 " i passed out 2 months ago in the bathroom and broke some ribs"    . Tachy-brady syndrome ([place])    a. s/p MDT PPM 2013.  . Tricuspid regurgitation     Past Surgical History:  Procedure Laterality Date  . ABDOMINAL AORTOGRAM W/LOWER EXTREMITY Bilateral 02/15/2017   Procedure: ABDOMINAL AORTOGRAM W/LOWER EXTREMITY;  Surgeon: Lorretta Harp, MD;  Location: [place] [facility];  Service: Cardiovascular;  Laterality: Bilateral;  bilat  . ABDOMINAL AORTOGRAM W/LOWER EXTREMITY  N/A 10/06/2018   Procedure: ABDOMINAL AORTOGRAM W/LOWER EXTREMITY;  Surgeon: Lorretta Harp, MD;  Location: [place] [facility];  Service: Cardiovascular;  Laterality: N/A;  . Ablation of typical atrial flutter (CTI line)  09/05/2016   Dr Antonieta Pert at [facility]   . [place]   Removed 01/2018  . BREAST IMPLANT REMOVAL Bilateral 01/14/2018   Procedure: REMOVAL BILATERAL BREAST IMPLANTS;  Surgeon: Irene Limbo, MD;  Location: [place];  Service: Plastics;  Laterality: Bilateral;  . CAPSULECTOMY Bilateral 01/14/2018   Procedure: CAPSULECTOMY;  Surgeon: Irene Limbo, MD;  Location: [place];  Service: Plastics;  Laterality: Bilateral;  . CARDIAC CATHETERIZATION N/A 01/01/2015   Procedure: Left Heart Cath and Coronary Angiography;  Surgeon: Jettie Booze, MD;  Location: [place] [facility];  Service: Cardiovascular;  Laterality: N/A;  . CARDIAC CATHETERIZATION N/A 05/12/2015   Procedure: Left Heart Cath and Coronary  Angiography;  Surgeon: Troy Sine, MD;  Location: [place] [facility];  Service: Cardiovascular;  Laterality: N/A;  . CARDIAC CATHETERIZATION N/A 05/12/2015   Procedure: Coronary Stent Intervention;  Surgeon: Troy Sine, MD;  Location: [place] [facility];  Service: Cardiovascular;  Laterality: N/A;  . CARDIOVERSION N/A 02/01/2015   Procedure: CARDIOVERSION;  Surgeon: Lelon Perla, MD;  Location[facility] ENDOSCOPY;  Service: Cardiovascular;  Laterality: N/A;  . CARDIOVERSION N/A 08/30/2015   Procedure: CARDIOVERSION;  Surgeon: Sanda Klein, MD;  Location: [place] ENDOSCOPY;  Service: Cardiovascular;  Laterality: N/A;  . CARPAL TUNNEL RELEASE  2008   "right hand/thumb; carpal tunnel repair; got rid of arthritis" (01/21/2012)  . CLIPPING OF ATRIAL APPENDAGE N/A 01/04/2015   Procedure: CLIPPING OF ATRIAL APPENDAGE;  Surgeon: Grace Isaac, MD;  Location: [place];  Service: Open Heart Surgery;  Laterality: N/A;  . COLONOSCOPY N/A 12/21/2015   Procedure: COLONOSCOPY;  Surgeon: Milus Banister, MD;  Location: [facility]OS[facility];  Service: Endoscopy;  Laterality: N/A;  . CORONARY ARTERY BYPASS GRAFT N/A 01/04/2015   Procedure: CORONARY ARTERY BYPASS GRAFTING (CABG) x 3 using left internal mammory artery and greater saphenous vein right leg harvested endoscopically.;  Surgeon: Grace Isaac, MD;  LIMA-LAD, SVG-RI, SVG-PDA  . ESOPHAGOGASTRODUODENOSCOPY (EGD) WITH PROPOFOL N/A 12/18/2015   Procedure: ESOPHAGOGASTRODUODENOSCOPY (EGD) WITH PROPOFOL;  Surgeon: Milus Banister, MD;  Location: [facility];  Service: Endoscopy;  Laterality: N/A;  . FACELIFT, LOWER 2/3  1995   "mini" (01/21/2012)  . GIVENS CAPSULE STUDY N/A 12/21/2015   Procedure: GIVENS CAPSULE STUDY;  Surgeon: Milus Banister, MD;  Location: [facility];  Service: Endoscopy;  Laterality: N/A;  . Lower Arterial Examination  10/28/2011   R. SFA stent mild-moderate mixed density plaque with elevated velocities consistent with 50% diameter  reduction. L. SFA stent moderate mixed denisty plaque at mid to distal level consistent with 50-69% diameter reduction.  . LOWER EXTREMITY ANGIOGRAPHY N/A 02/15/2017   Procedure: LOWER EXTREMITY ANGIOGRAPHY;  Surgeon: Lorretta Harp, MD;  Location: [place] [facility];  Service: Cardiovascular;  Laterality: N/A;  . OOPHORECTOMY  ~1979  . PARATHYROIDECTOMY N/A 05/13/2017   Procedure: PARATHYROIDECTOMY;  Surgeon: Armandina Gemma, MD;  Location: [facility];  Service: General;  Laterality: N/A;  . PARTIAL COLECTOMY  2010  . PERIPHERAL ARTERIAL STENT GRAFT  2012; 2012   "LLE; RLE" (01/21/2012)  . PERIPHERAL VASCULAR BALLOON ANGIOPLASTY Right 02/15/2017   Procedure: PERIPHERAL VASCULAR BALLOON ANGIOPLASTY;  Surgeon: Lorretta Harp, MD;  Location: [place] [facility];  Service: Cardiovascular;  Laterality: Right;  SFA    . PERIPHERAL VASCULAR INTERVENTION Right 10/06/2018   Procedure: PERIPHERAL VASCULAR INTERVENTION;  Surgeon: Lorretta Harp, MD;  Location: [place] [facility];  Service: Cardiovascular;  Laterality: Right;  . PERMANENT PACEMAKER INSERTION N/A 01/21/2012   Medtronic Adapta L implanted by Dr Sallyanne Kuster for tachy/brady syndrome  . POSTERIOR CERVICAL LAMINECTOMY  1985  . TEE WITHOUT CARDIOVERSION N/A 01/04/2015   Procedure: TRANSESOPHAGEAL ECHOCARDIOGRAM (TEE);  Surgeon: Grace Isaac, MD;  Location: [place];  Service: Open Heart Surgery;  Laterality: N/A;  . TEE WITHOUT CARDIOVERSION N/A 02/01/2015   Procedure: TRANSESOPHAGEAL ECHOCARDIOGRAM (TEE);  Surgeon: Lelon Perla, MD;  Location: [facility] ENDOSCOPY;  Service: Cardiovascular;  Laterality: N/A;  . TEE WITHOUT CARDIOVERSION N/A 08/30/2015   Procedure: TRANSESOPHAGEAL ECHOCARDIOGRAM (TEE);  Surgeon: Sanda Klein, MD;  Location: [facility] ENDOSCOPY;  Service: Cardiovascular;  Laterality: N/A;  . [place] History   Socioeconomic History  . Marital status: Widowed    Spouse name: Not on file  . Number of children:  3  . Years of education: Not on file  . Highest education level: Not on file  Occupational History  . 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 Smoking status: Former Smoker    Packs/day: 0.75    Years: 10.00    Pack years: 7.50    Types: Cigarettes    Quit date: 2008    Years since quitting: 13.9  . Smokeless tobacco: Never Used  . Tobacco comment: 01/21/2012 "quit smoking ~ 2002"  Vaping Use  . Vaping Use: Never used  Substance and Sexual Activity  . Alcohol use: Yes    Comment: daily coctail  . Drug use: No  . Sexual activity: Not Currently  Other Topics Concern  . Not on file  Social History Narrative   Lives in [place].  Retired.   Social Determinants of Health   Financial Resource Strain:   . Difficulty of Paying Living Expenses: Not on file  Food Insecurity:   . Worried About Charity fundraiser in the Last Year: Not on file  . Ran Out of Food in the Last Year: Not on file  Transportation Needs:   . Lack of Transportation (Medical): Not on file  . Lack of Transportation (Non-Medical): Not on file  Physical Activity:   . Days of Exercise per Week: Not on file  . Minutes of Exercise per Session: Not on file  Stress:   . Feeling of Stress : Not on file  Social Connections:   . Frequency of Communication with Friends and Family: Not on file  . Frequency of Social Gatherings with Friends and Family: Not on file  . Attends Religious Services: Not on file  . Active Member of Clubs or Organizations: Not on file  . Attends Archivist Meetings: Not on file  . Marital Status: Not on file  Intimate Partner Violence:   . Fear of Current or Ex-Partner: Not on file  . Emotionally Abused: Not on file  . Physically Abused: Not on file  . Sexually Abused: Not on file    Family History  Problem Relation Age of Onset  . Heart disease Mother   . Hypertension Mother   . Stroke Mother   . Stroke Father   . Heart disease  Father   . Heart disease Brother   . Hypertension Brother        2  brothers  . CVA Brother        2 brothers  . Heart attack Brother        2 brothers     Review of Systems  Constitutional: Negative.  Negative for chills and fever.  HENT: Negative.  Negative for congestion and sore throat.   Respiratory: Negative.  Negative for cough and shortness of breath.   Cardiovascular: Negative.  Negative for chest pain and palpitations.  Gastrointestinal: Negative.  Negative for abdominal pain, diarrhea, nausea and vomiting.  Genitourinary: Negative.  Negative for dysuria and hematuria.  Musculoskeletal: Negative.  Negative for myalgias and neck pain.  Skin: Negative.  Negative for rash.  Neurological: Negative.  Negative for dizziness and headaches.  All other systems reviewed and are negative.   Vitals:   01/01/20 1420  BP: 131/78  Pulse: 84  Resp: 16  Temp: 98.1 F (36.7 C)  SpO2: 97%    Physical Exam Vitals reviewed.  Constitutional:      Appearance: Normal appearance.  HENT:     Head: Normocephalic.  Eyes:     Extraocular Movements: Extraocular movements intact.     Pupils: Pupils are equal, round, and reactive to light.  Cardiovascular:     Rate and Rhythm: Normal rate and regular rhythm.     Pulses: Normal pulses.     Heart sounds: Normal heart sounds.  Pulmonary:     Effort: Pulmonary effort is normal.     Breath sounds: Normal breath sounds.  Musculoskeletal:        General: Normal range of motion.     Cervical back: Normal range of motion and neck supple.  Skin:    General: Skin is warm and dry.     Capillary Refill: Capillary refill takes less than 2 seconds.  Neurological:     General: No focal deficit present.     Mental Status: She is alert and oriented to person, place, and time.  Psychiatric:        Mood and Affect: Mood normal.        Behavior: Behavior normal.      ASSESSMENT & PLAN: Clinically stable.  Recovering well. No medical concerns  identified during this visit. Continue present medications.  No changes. Follow-up in 6 months.  [name] was seen today for post viral syndrome.  Diagnoses and all orders for this visit:  Post viral syndrome  Cardiac pacemaker in situ  Hypertensive heart and renal disease with congestive heart failure (HCC)  Recurrent major depressive disorder, in full remission ([place])  Aortic atherosclerosis ([place])  Pneumonia due to COVID-19 virus  Cervical disc disorder with radiculopathy of cervical region  Chronic obstructive pulmonary disease, unspecified COPD type ([place])  History of coronary artery disease    Patient Instructions       If you have lab work done today you will be contacted with your lab results within the next 2 weeks.  If you have not heard from [place] then please contact us. The fastest way to get your results is to register for My Chart.   IF you received an x-ray today, you will receive an invoice from [facility]. Please contact [facility] at [phone] with questions or concerns regarding your invoice.   IF you received labwork today, you will receive an invoice from [facility]. Please contact [facility] at [phone] with questions or concerns regarding your invoice.   Our billing staff will not be able to assist you with questions regarding bills from these companies.  You will be contacted with the lab results as soon as they are available. The fastest way to get your results is to activate your My Chart account. Instructions are located on the last page of this paperwork. If you have not heard from [place] regarding the results in 2 weeks, please contact this office.     Health Maintenance After Age 50 After age 77, you are at a higher risk for certain long-term diseases and infections as well as injuries from falls. Falls are a major cause of broken bones and head injuries in people who are older than age 69. Getting regular preventive care can  help to keep you healthy and well. Preventive care includes getting regular testing and making lifestyle changes as recommended by your health care provider. Talk with your health care provider about:  Which screenings and tests you should have. A screening is a test that checks for a disease when you have no symptoms.  A diet and exercise plan that is right for you. What should I know about screenings and tests to prevent falls? Screening and testing are the best ways to find a health problem early. Early diagnosis and treatment give you the best chance of managing medical conditions that are common after age 41. Certain conditions and lifestyle choices may make you more likely to have a fall. Your health care provider may recommend:  Regular vision checks. Poor vision and conditions such as cataracts can make you more likely to have a fall. If you wear glasses, make sure to get your prescription updated if your vision changes.  Medicine review. Work with your health care provider to regularly review all of the medicines you are taking, including over-the-counter medicines. Ask your health care provider about any side effects that may make you more likely to have a fall. Tell your health care provider if any medicines that you take make you feel dizzy or sleepy.  Osteoporosis screening. Osteoporosis is a condition that causes the bones to get weaker. This can make the bones weak and cause them to break more easily.  Blood pressure screening. Blood pressure changes and medicines to control blood pressure can make you feel dizzy.  Strength and balance checks. Your health care provider may recommend certain tests to check your strength and balance while standing, walking, or changing positions.  Foot health exam. Foot pain and numbness, as well as not wearing proper footwear, can make you more likely to have a fall.  Depression screening. You may be more likely to have a fall if you have a fear of  falling, feel emotionally low, or feel unable to do activities that you used to do.  Alcohol use screening. Using too much alcohol can affect your balance and may make you more likely to have a fall. What actions can I take to lower my risk of falls? General instructions  Talk with your health care provider about your risks for falling. Tell your health care provider if: ? You fall. Be sure to tell your health care provider about all falls, even ones that seem minor. ? You feel dizzy, sleepy, or off-balance.  Take over-the-counter and prescription medicines only as told by your health care provider. These include any supplements.  Eat a healthy diet and maintain a healthy weight. A healthy diet includes low-fat dairy products, low-fat (lean) meats, and fiber from whole grains, beans, and lots of fruits and vegetables. Home safety  Remove any tripping hazards, such as rugs, cords, and  clutter.  Install safety equipment such as grab bars in bathrooms and safety rails on stairs.  Keep rooms and walkways well-lit. Activity   Follow a regular exercise program to stay fit. This will help you maintain your balance. Ask your health care provider what types of exercise are appropriate for you.  If you need a cane or walker, use it as recommended by your health care provider.  Wear supportive shoes that have nonskid soles. Lifestyle  Do not drink alcohol if your health care provider tells you not to drink.  If you drink alcohol, limit how much you have: ? 0-1 drink a day for women. ? 0-2 drinks a day for men.  Be aware of how much alcohol is in your drink. In the [place] one drink equals one typical bottle of beer (12 oz), one-half glass of wine (5 oz), or one shot of hard liquor (1 oz).  Do not use any products that contain nicotine or tobacco, such as cigarettes and e-cigarettes. If you need help quitting, ask your health care provider. Summary  Having a healthy lifestyle and getting  preventive care can help to protect your health and wellness after age 15.  Screening and testing are the best way to find a health problem early and help you avoid having a fall. Early diagnosis and treatment give you the best chance for managing medical conditions that are more common for people who are older than age 47.  Falls are a major cause of broken bones and head injuries in people who are older than age 39. Take precautions to prevent a fall at home.  Work with your health care provider to learn what changes you can make to improve your health and wellness and to prevent falls. This information is not intended to replace advice given to you by your health care provider. Make sure you discuss any questions you have with your health care provider. Document Revised: 05/19/2018 Document Reviewed: 12/09/2016 Elsevier Patient Education  2020 Elsevier Inc.      Agustina Caroli, MD [facility]

## 2020-01-01 NOTE — Patient Instructions (Addendum)
   If you have lab work done today you will be contacted with your lab results within the next 2 weeks.  If you have not heard from us then please contact us. The fastest way to get your results is to register for My Chart.   IF you received an x-ray today, you will receive an invoice from [facility]. Please contact [facility] at [phone] with questions or concerns regarding your invoice.   IF you received labwork today, you will receive an invoice from [facility]. Please contact [facility] at [phone] with questions or concerns regarding your invoice.   Our billing staff will not be able to assist you with questions regarding bills from these companies.  You will be contacted with the lab results as soon as they are available. The fastest way to get your results is to activate your My Chart account. Instructions are located on the last page of this paperwork. If you have not heard from us regarding the results in 2 weeks, please contact this office.     Health Maintenance After Age 65 After age 65, you are at a higher risk for certain long-term diseases and infections as well as injuries from falls. Falls are a major cause of broken bones and head injuries in people who are older than age 65. Getting regular preventive care can help to keep you healthy and well. Preventive care includes getting regular testing and making lifestyle changes as recommended by your health care provider. Talk with your health care provider about:  Which screenings and tests you should have. A screening is a test that checks for a disease when you have no symptoms.  A diet and exercise plan that is right for you. What should I know about screenings and tests to prevent falls? Screening and testing are the best ways to find a health problem early. Early diagnosis and treatment give you the best chance of managing medical conditions that are common after age 65. Certain conditions and  lifestyle choices may make you more likely to have a fall. Your health care provider may recommend:  Regular vision checks. Poor vision and conditions such as cataracts can make you more likely to have a fall. If you wear glasses, make sure to get your prescription updated if your vision changes.  Medicine review. Work with your health care provider to regularly review all of the medicines you are taking, including over-the-counter medicines. Ask your health care provider about any side effects that may make you more likely to have a fall. Tell your health care provider if any medicines that you take make you feel dizzy or sleepy.  Osteoporosis screening. Osteoporosis is a condition that causes the bones to get weaker. This can make the bones weak and cause them to break more easily.  Blood pressure screening. Blood pressure changes and medicines to control blood pressure can make you feel dizzy.  Strength and balance checks. Your health care provider may recommend certain tests to check your strength and balance while standing, walking, or changing positions.  Foot health exam. Foot pain and numbness, as well as not wearing proper footwear, can make you more likely to have a fall.  Depression screening. You may be more likely to have a fall if you have a fear of falling, feel emotionally low, or feel unable to do activities that you used to do.  Alcohol use screening. Using too much alcohol can affect your balance and may make you more likely to   have a fall. What actions can I take to lower my risk of falls? General instructions  Talk with your health care provider about your risks for falling. Tell your health care provider if: ? You fall. Be sure to tell your health care provider about all falls, even ones that seem minor. ? You feel dizzy, sleepy, or off-balance.  Take over-the-counter and prescription medicines only as told by your health care provider. These include any  supplements.  Eat a healthy diet and maintain a healthy weight. A healthy diet includes low-fat dairy products, low-fat (lean) meats, and fiber from whole grains, beans, and lots of fruits and vegetables. Home safety  Remove any tripping hazards, such as rugs, cords, and clutter.  Install safety equipment such as grab bars in bathrooms and safety rails on stairs.  Keep rooms and walkways well-lit. Activity   Follow a regular exercise program to stay fit. This will help you maintain your balance. Ask your health care provider what types of exercise are appropriate for you.  If you need a cane or walker, use it as recommended by your health care provider.  Wear supportive shoes that have nonskid soles. Lifestyle  Do not drink alcohol if your health care provider tells you not to drink.  If you drink alcohol, limit how much you have: ? 0-1 drink a day for women. ? 0-2 drinks a day for men.  Be aware of how much alcohol is in your drink. In the [place] one drink equals one typical bottle of beer (12 oz), one-half glass of wine (5 oz), or one shot of hard liquor (1 oz).  Do not use any products that contain nicotine or tobacco, such as cigarettes and e-cigarettes. If you need help quitting, ask your health care provider. Summary  Having a healthy lifestyle and getting preventive care can help to protect your health and wellness after age 65.  Screening and testing are the best way to find a health problem early and help you avoid having a fall. Early diagnosis and treatment give you the best chance for managing medical conditions that are more common for people who are older than age 65.  Falls are a major cause of broken bones and head injuries in people who are older than age 65. Take precautions to prevent a fall at home.  Work with your health care provider to learn what changes you can make to improve your health and wellness and to prevent falls. This information is not intended  to replace advice given to you by your health care provider. Make sure you discuss any questions you have with your health care provider. Document Revised: 05/19/2018 Document Reviewed: 12/09/2016 Elsevier Patient Education  2020 [employer]  

## 2020-01-02 ENCOUNTER — Telehealth: Payer: Self-pay

## 2020-01-02 DIAGNOSIS — M47812 Spondylosis without myelopathy or radiculopathy, cervical region: Secondary | ICD-10-CM | POA: Diagnosis not present

## 2020-01-02 DIAGNOSIS — M4802 Spinal stenosis, cervical region: Secondary | ICD-10-CM | POA: Diagnosis not present

## 2020-01-02 NOTE — Telephone Encounter (Signed)
Hi Dr. Sallyanne Kuster,  [name] has upcoming spinal injection planned and is being asked to hold Aspirin and Plavix. She has a history of CAD s/p CABG in 12/2014 and DES to native RCA and SVG to RCA in 03/5496, chronic diastolic CHF, atrial fibrillation s/p ablation no longer on anticoagulation. HTN, HLD, hypothyroidism, CKD. She was recently seen in the ED for left sided sharp chest pain that radiated to back/left shoulder blade. She reported that moving her head would reproduce some of the pain. Work up was unremarkable. EKG showed no ischemic changes. Troponin was negative x2. Chest CTA negative for dissection. She was seen by Dr. Audie Box. Symptoms were felt to be atypical and likely musculoskeletal. She is now scheduled for spinal injection.  1. As long as she as not had any recurrent chest pain, is she OK to proceed with this low risk procedure? 2. Can you please comment on how long she can hold Aspirin and Plavix?  Please route response to P CV DIV PREOP.  Thank you! [name]

## 2020-01-02 NOTE — Telephone Encounter (Signed)
   [facility] Pre-operative Risk Assessment    HEARTCARE STAFF: - Please ensure there is not already an duplicate clearance open for this procedure. - Under Visit Info/Reason for Call, type in Other and utilize the format Clearance MM/DD/YY or Clearance TBD. Do not use dashes or single digits. - If request is for dental extraction, please clarify the # of teeth to be extracted.  Request for surgical clearance:  1. What type of surgery is being performed? Spinal Injection   2. When is this surgery scheduled? TBD   3. What type of clearance is required (medical clearance vs. Pharmacy clearance to hold med vs. Both)? Medical  4. Are there any medications that need to be held prior to surgery and how long?Plavix and Aspirin   5. Practice name and name of physician performing surgery? [facility], Marlaine Hind MD  6. What is the office phone number? [phone]   7.   What is the office fax number? [phone]  8.   Anesthesia type (None, local, MAC, general) ? None Listed   Monia Pouch 01/02/2020, 2:17 PM  _________________________________________________________________   (provider comments below)

## 2020-01-03 NOTE — Telephone Encounter (Signed)
   Primary Cardiologist: Sanda Klein, MD  Chart reviewed as part of pre-operative protocol coverage. Patient was recently seen in the ED on 12/20/2019 for left sided sharp chest pain that radiated to back/left shoulder blade. She reported that moving her head would reproduce some of the pain. Work up was unremarkable. EKG showed no ischemic changes. Troponin was negative x2. Chest CTA negative for dissection. She was seen by Cardiology and symptoms were felt to be atypical and likely musculoskeletal. She is now scheduled for a cervical spine injection. Patient contacted today and denies any recurrent similar chest pain or shortness of breath. Per Dr. Sallyanne Kuster, patient is OK to proceed with spinal injection.  Aspirin and Plavix can be held for 5-7 days prior to procedure. Both should be restarted as soon as able following spinal injection.  I will route this recommendation to the requesting party via Epic fax function and remove from pre-op pool.  Please call with questions.  Darreld Mclean, PA-C 01/03/2020, 8:58 AM

## 2020-01-03 NOTE — Telephone Encounter (Signed)
OK to proceed with spinal injection. OK to hold ASA and clopidogrel for 5-7 days.

## 2020-01-12 ENCOUNTER — Ambulatory Visit: Payer: Medicare HMO | Admitting: Pulmonary Disease

## 2020-01-19 DIAGNOSIS — M47812 Spondylosis without myelopathy or radiculopathy, cervical region: Secondary | ICD-10-CM | POA: Diagnosis not present

## 2020-01-23 ENCOUNTER — Ambulatory Visit: Payer: Medicare HMO | Admitting: Cardiovascular Disease

## 2020-01-29 ENCOUNTER — Ambulatory Visit: Payer: Self-pay | Admitting: Neurology

## 2020-02-02 ENCOUNTER — Other Ambulatory Visit: Payer: Self-pay | Admitting: Cardiovascular Disease

## 2020-02-02 ENCOUNTER — Other Ambulatory Visit: Payer: Self-pay | Admitting: Emergency Medicine

## 2020-02-05 ENCOUNTER — Telehealth: Payer: Self-pay | Admitting: *Deleted

## 2020-02-05 NOTE — Telephone Encounter (Signed)
Schedule AWV.  

## 2020-03-19 ENCOUNTER — Ambulatory Visit: Payer: Medicare HMO | Admitting: Cardiovascular Disease

## 2020-03-19 ENCOUNTER — Other Ambulatory Visit: Payer: Self-pay

## 2020-03-19 VITALS — BP 141/72 | HR 80 | Ht 64.0 in | Wt 150.6 lb

## 2020-03-19 DIAGNOSIS — I480 Paroxysmal atrial fibrillation: Secondary | ICD-10-CM | POA: Diagnosis not present

## 2020-03-19 DIAGNOSIS — I495 Sick sinus syndrome: Secondary | ICD-10-CM

## 2020-03-19 DIAGNOSIS — I679 Cerebrovascular disease, unspecified: Secondary | ICD-10-CM

## 2020-03-19 DIAGNOSIS — Z950 Presence of cardiac pacemaker: Secondary | ICD-10-CM

## 2020-03-19 DIAGNOSIS — I25708 Atherosclerosis of coronary artery bypass graft(s), unspecified, with other forms of angina pectoris: Secondary | ICD-10-CM | POA: Diagnosis not present

## 2020-03-19 DIAGNOSIS — I739 Peripheral vascular disease, unspecified: Secondary | ICD-10-CM

## 2020-03-19 DIAGNOSIS — Z5181 Encounter for therapeutic drug level monitoring: Secondary | ICD-10-CM | POA: Diagnosis not present

## 2020-03-19 DIAGNOSIS — I5032 Chronic diastolic (congestive) heart failure: Secondary | ICD-10-CM

## 2020-03-19 DIAGNOSIS — E7800 Pure hypercholesterolemia, unspecified: Secondary | ICD-10-CM

## 2020-03-19 DIAGNOSIS — I10 Essential (primary) hypertension: Secondary | ICD-10-CM

## 2020-03-19 DIAGNOSIS — E213 Hyperparathyroidism, unspecified: Secondary | ICD-10-CM

## 2020-03-19 DIAGNOSIS — Z79899 Other long term (current) drug therapy: Secondary | ICD-10-CM

## 2020-03-19 MED ORDER — DOFETILIDE 125 MCG PO CAPS: 125.0000 ug | ORAL_CAPSULE | Freq: Two times a day (BID) | ORAL | 3 refills | Status: DC

## 2020-03-19 NOTE — Progress Notes (Signed)
Patient ID: [name], female   DOB: 11/12/1940, 80 y.o.   MRN: [id number]     Cardiology Office Note    Date:  03/24/2020   ID:  [name], [name] 1940/08/04, MRN [id number]  PCP:  Horald Pollen, MD  Cardiologist:   Sanda Klein, MD   Chief Complaint  Patient presents with  . Atrial Fibrillation    History of Present Illness:  [name] is a 80 y.o. female with coronary artery disease s/p bypass surgery (November 2016) and placement of drug-eluting stents (DES to distal SVG-RCA, April 2017 ) and left atrial appendage clipping, symptomatic paroxysmal atrial fibrillation and atrial flutter with radiofrequency ablation (cavotricuspid isthmus and pulmonary vein isolation), PAD with previous stents, extensive intracranial atherosclerotic disease, history of GI bleeding while on anticoagulation, volatile hypertension.  She is done well from a cardiovascular point of view since her last appointment.  She did have a COVID-19 infection in August from which she recovered well.  She has not been aware of any palpitations and denies dizziness, syncope, exertional angina or dyspnea, lower extremity edema, orthopnea, PND, new focal neurological complaints or intermittent claudication.  Her EKG today shows a more markedly prolonged QTC at 521 ms (atrial paced, ventricular sensed).  Her pacemaker shows a very low burden of atrial fibrillation and less than 0.1%, pretty much limited to one episode on December 31, 2019 the lasted for 52 minutes.  Otherwise she has 98.4 % atrial pacing and less than 1.3% ventricular pacing.  The heart rate histogram distribution is good.  Her Medtronic Adapta dual-chamber device implanted in 2013 still has 5 years of remaining longevity.  The pacemaker has also recorded a 12-second episode of nonsustained VT that occurred January 02, 2020.    In late August 2020 she underwent an abdominal aortic angiogram with runoff, followed by angioplasty and drug  coated stenting of a chronic total occlusion of the right superficial femoral artery. Preprocedure ABI was 0.82  Post procedure ABI was 1.1 at follow-up on October 18 2018, 1.0 in March 2021 (bilaterally).  By ultrasound performed in July 2021, known occluded mid right SFA stent with reconstitution above the level of the popliteal artery and three-vessel runoff, 80% stenosis in the proximal left SFA with widely patent mid left SFA stent.  Blood pressure control has been generally good.  She has a history of hyperkalemia with ACE inhibitors in the past.  Amiodarone caused hypothyroidism and alopecia. She has tried taking statins but has repeatedly stopped them due to musculoskeletal problems.  She has been atorvastatin, but seems to tolerate the current 10 mg dose of rosuvastatin.  Have not rechecked her LDL cholesterol since last November 2020 when it was 140 without any therapy.  She has normal left ventricular systolic function. She does have evidence of grade 2 diastolic dysfunction on previous echo. At the time of her bypass surgery she received an Atricure left atrial appendage clip. She is also on clopidogrel following her drug-eluting stents in April 2017. The pacemaker was implanted in 2013 for symptomatic sinus bradycardia. She also has a history of peripheral arterial disease and received bilateral superficial femoral artery stents in 2016, repeat revascularization with drug-coated stent to a totally occluded right superficial femoral artery in 2020. She has never smoked but drinks daily. She has treated hypertension.  After undergoing bypass surgery in November 2016 she returned with non-ST segment elevation myocardial infarction in April 2017 and required placement of stents both in the native right  coronary artery (3.020 mm Synergy DES) and in the saphenous vein graft to the distal right coronary artery (2.7538 mm Synergy DES) due to early graft dysfunction. The saphenous vein graft to the  ramus intermedius was also occluded, but this vessel was left for medical therapy.    Past Medical History:  Diagnosis Date  . ANXIETY 12/31/2009  . Aortic insufficiency    a. mod by echo 2017.  Marland Kitchen CAD (coronary artery disease)    a. s/p CABGx3 and LAA clipping in 12/2014, NSTEMI 05/2015 s/p DES to native RCA and SVG-dRCA; occluded ramus-SVG was treated medically). c. neg [facility]uc 10/2015 [facility].  . Chronic diastolic CHF (congestive heart failure) ([place])   . Chronic fatigue   . CKD (chronic kidney disease), stage II   . DEPRESSION 12/31/2009  . [place] DISEASE, CERVICAL 12/31/2009  . [place] DISEASE, LUMBAR 12/31/2009  . DIVERTICULITIS, HX OF 12/31/2009  . Essential hypertension   . Gait abnormality 03/25/2017  . GLUCOSE INTOLERANCE 12/31/2009  . Habitual alcohol use   . History of stroke    self reports " they say i have had some pin strokes"   . Hypercalcemia   . Hyperkalemia   . Hyperlipidemia 12/31/2009  . Hypothyroidism   . MENOPAUSE, EARLY 12/31/2009  . NSTEMI (non-ST elevated myocardial infarction) ([place]) 05/11/2015  . Orthostatic hypotension   . OSTEOARTHRITIS, HAND   . Pacemaker 01/21/2012   MDT Adapta dual chamber  . Paroxysmal atrial flutter ([place])   . Persistent atrial fibrillation ([place])   . PVD (peripheral vascular disease), hx stents to bil SFAs 02/2010   . Rash, skin     superficial raised red pencil point sized rash bilateral forearms; states " it started when i started the Plavix "   . Ruptured left breast implant   . Symptomatic sinus bradycardia 01/22/2012  . Syncope    a. 11/2015 ? due to medications.  . Syncope    reports on 05-10-17 " i passed out 2 months ago in the bathroom and broke some ribs"    . Tachy-brady syndrome ([place])    a. s/p MDT PPM 2013.  . Tricuspid regurgitation     Past Surgical History:  Procedure Laterality Date  . ABDOMINAL AORTOGRAM W/LOWER EXTREMITY Bilateral 02/15/2017   Procedure: ABDOMINAL AORTOGRAM W/LOWER EXTREMITY;  Surgeon: Lorretta Harp, MD;  Location: [place] [facility];  Service: Cardiovascular;  Laterality: Bilateral;  bilat  . ABDOMINAL AORTOGRAM W/LOWER EXTREMITY N/A 10/06/2018   Procedure: ABDOMINAL AORTOGRAM W/LOWER EXTREMITY;  Surgeon: Lorretta Harp, MD;  Location: [place] [facility];  Service: Cardiovascular;  Laterality: N/A;  . Ablation of typical atrial flutter (CTI line)  09/05/2016   Dr Antonieta Pert at [facility]   . [place]   Removed 01/2018  . BREAST IMPLANT REMOVAL Bilateral 01/14/2018   Procedure: REMOVAL BILATERAL BREAST IMPLANTS;  Surgeon: Irene Limbo, MD;  Location: [place];  Service: Plastics;  Laterality: Bilateral;  . CAPSULECTOMY Bilateral 01/14/2018   Procedure: CAPSULECTOMY;  Surgeon: Irene Limbo, MD;  Location: [place];  Service: Plastics;  Laterality: Bilateral;  . CARDIAC CATHETERIZATION N/A 01/01/2015   Procedure: Left Heart Cath and Coronary Angiography;  Surgeon: Jettie Booze, MD;  Location: [place] [facility];  Service: Cardiovascular;  Laterality: N/A;  . CARDIAC CATHETERIZATION N/A 05/12/2015   Procedure: Left Heart Cath and Coronary Angiography;  Surgeon: Troy Sine, MD;  Location: [place] [facility];  Service: Cardiovascular;  Laterality: N/A;  .  CARDIAC CATHETERIZATION N/A 05/12/2015   Procedure: Coronary Stent Intervention;  Surgeon: Troy Sine, MD;  Location: [place] [facility];  Service: Cardiovascular;  Laterality: N/A;  . CARDIOVERSION N/A 02/01/2015   Procedure: CARDIOVERSION;  Surgeon: Lelon Perla, MD;  Location: [facility] ENDOSCOPY;  Service: Cardiovascular;  Laterality: N/A;  . CARDIOVERSION N/A 08/30/2015   Procedure: CARDIOVERSION;  Surgeon: Sanda Klein, MD;  Location: [place] ENDOSCOPY;  Service: Cardiovascular;  Laterality: N/A;  . CARPAL TUNNEL RELEASE  2008   "right hand/thumb; carpal tunnel repair; got rid of arthritis" (01/21/2012)  . CLIPPING OF ATRIAL APPENDAGE N/A 01/04/2015   Procedure:  CLIPPING OF ATRIAL APPENDAGE;  Surgeon: Grace Isaac, MD;  Location: [place];  Service: Open Heart Surgery;  Laterality: N/A;  . COLONOSCOPY N/A 12/21/2015   Procedure: COLONOSCOPY;  Surgeon: Milus Banister, MD;  Location: [facility] ENDOSCOPY;  Service: Endoscopy;  Laterality: N/A;  . CORONARY ARTERY BYPASS GRAFT N/A 01/04/2015   Procedure: CORONARY ARTERY BYPASS GRAFTING (CABG) x 3 using left internal mammory artery and greater saphenous vein right leg harvested endoscopically.;  Surgeon: Grace Isaac, MD;  LIMA-LAD, SVG-RI, SVG-PDA  . ESOPHAGOGASTRODUODENOSCOPY (EGD) WITH PROPOFOL N/A 12/18/2015   Procedure: ESOPHAGOGASTRODUODENOSCOPY (EGD) WITH PROPOFOL;  Surgeon: Milus Banister, MD;  Location: [facility] ENDOSCOPY;  Service: Endoscopy;  Laterality: N/A;  . FACELIFT, LOWER 2/3  1995   "mini" (01/21/2012)  . GIVENS CAPSULE STUDY N/A 12/21/2015   Procedure: GIVENS CAPSULE STUDY;  Surgeon: Milus Banister, MD;  Location: [facility]OS[facility];  Service: Endoscopy;  Laterality: N/A;  . Lower Arterial Examination  10/28/2011   R. SFA stent mild-moderate mixed density plaque with elevated velocities consistent with 50% diameter reduction. L. SFA stent moderate mixed denisty plaque at mid to distal level consistent with 50-69% diameter reduction.  . LOWER EXTREMITY ANGIOGRAPHY N/A 02/15/2017   Procedure: LOWER EXTREMITY ANGIOGRAPHY;  Surgeon: Lorretta Harp, MD;  Location: [place] [facility];  Service: Cardiovascular;  Laterality: N/A;  . OOPHORECTOMY  ~1979  . PARATHYROIDECTOMY N/A 05/13/2017   Procedure: PARATHYROIDECTOMY;  Surgeon: Armandina Gemma, MD;  Location: [facility];  Service: General;  Laterality: N/A;  . PARTIAL COLECTOMY  2010  . PERIPHERAL ARTERIAL STENT GRAFT  2012; 2012   "LLE; RLE" (01/21/2012)  . PERIPHERAL VASCULAR BALLOON ANGIOPLASTY Right 02/15/2017   Procedure: PERIPHERAL VASCULAR BALLOON ANGIOPLASTY;  Surgeon: Lorretta Harp, MD;  Location: [place] [facility];  Service: Cardiovascular;   Laterality: Right;  SFA    . PERIPHERAL VASCULAR INTERVENTION Right 10/06/2018   Procedure: PERIPHERAL VASCULAR INTERVENTION;  Surgeon: Lorretta Harp, MD;  Location: [place] [facility];  Service: Cardiovascular;  Laterality: Right;  . PERMANENT PACEMAKER INSERTION N/A 01/21/2012   Medtronic Adapta L implanted by Dr Sallyanne Kuster for tachy/brady syndrome  . POSTERIOR CERVICAL LAMINECTOMY  1985  . TEE WITHOUT CARDIOVERSION N/A 01/04/2015   Procedure: TRANSESOPHAGEAL ECHOCARDIOGRAM (TEE);  Surgeon: Grace Isaac, MD;  Location: [place];  Service: Open Heart Surgery;  Laterality: N/A;  . TEE WITHOUT CARDIOVERSION N/A 02/01/2015   Procedure: TRANSESOPHAGEAL ECHOCARDIOGRAM (TEE);  Surgeon: Lelon Perla, MD;  Location: [facility] ENDOSCOPY;  Service: Cardiovascular;  Laterality: N/A;  . TEE WITHOUT CARDIOVERSION N/A 08/30/2015   Procedure: TRANSESOPHAGEAL ECHOCARDIOGRAM (TEE);  Surgeon: Sanda Klein, MD;  Location: [facility] ENDOSCOPY;  Service: Cardiovascular;  Laterality: N/A;  . VAGINAL HYSTERECTOMY  1975    Current Medications: Outpatient Medications Prior to Visit  Medication Sig Dispense Refill  . alendronate (FOSAMAX) 70 MG tablet Take  70 mg by mouth every Saturday.    Marland Kitchen amLODipine (NORVASC) 5 MG tablet TAKE 1.5 TABLETS (7.5 MG TOTAL) BY MOUTH DAILY. (Patient taking differently: Take 2.5-5 mg by mouth See admin instructions. Take 5mg  in the morning and 2.5mg  in the evening.) 135 tablet 2  . aspirin EC 81 MG tablet Take 81 mg by mouth daily.     . Biotin 10000 MCG TABS Take 10,000 mcg by mouth daily.    . Cholecalciferol (VITAMIN D3 PO) Take 1 tablet by mouth daily.    . citalopram (CELEXA) 40 MG tablet TAKE 1 TABLET BY MOUTH EVERY DAY (Patient taking differently: Take 40 mg by mouth daily.) 90 tablet 1  . clopidogrel (PLAVIX) 75 MG tablet TAKE 1 TABLET (75 MG TOTAL) BY MOUTH DAILY WITH BREAKFAST. 90 tablet 3  . levothyroxine (SYNTHROID, LEVOTHROID) 25 MCG tablet Take 25 mcg by mouth daily before  breakfast.     . Magnesium 200 MG TABS Take 200 mg by mouth daily. Patient takes 1 tablet daily    . Multiple Vitamins-Minerals (MULTI-VITE) LIQD Take 1 tablet by mouth daily.     . niacin 250 MG tablet Take 250 mg by mouth daily.    . nitroGLYCERIN (NITROSTAT) 0.4 MG SL tablet Place 1 tablet (0.4 mg total) under the tongue every 5 (five) minutes x 3 doses as needed for chest pain. 25 tablet 2  . Olive Leaf Extract 250 MG CAPS Take 250 mg by mouth 2 (two) times daily.    . Omega-3 Fatty Acids (OMEGA 3 PO) Take 690 mg by mouth daily.    . vitamin C (ASCORBIC ACID) 500 MG tablet Take 500 mg by mouth daily.    Marland Kitchen dofetilide (TIKOSYN) 250 MCG capsule Take 1 capsule (250 mcg total) by mouth 2 (two) times daily. 180 capsule 3  . albuterol (VENTOLIN HFA) 108 (90 Base) MCG/ACT inhaler Inhale 1-2 puffs into the lungs every 6 (six) hours as needed for wheezing or shortness of breath. (Patient not taking: Reported on 03/19/2020) 8 g 0  . furosemide (LASIX) 40 MG tablet Take 40 mg by mouth daily as needed for fluid or edema.  (Patient not taking: Reported on 03/19/2020)    . rosuvastatin (CRESTOR) 10 MG tablet Take 1 tablet (10 mg total) by mouth daily. (Patient not taking: Reported on 03/19/2020) 90 tablet 3  . TURMERIC PO Take 475 mg by mouth daily. (Patient not taking: Reported on 03/19/2020)     No facility-administered medications prior to visit.     Allergies:   Brilinta [ticagrelor], Atorvastatin, Clonidine derivatives, Crestor [rosuvastatin calcium], Exforge [amlodipine besylate-valsartan], and Spironolactone   Social History   Socioeconomic History  . Marital status: Widowed    Spouse name: Not on file  . Number of children: 3  . Years of education: Not on file  . Highest education level: Not on file  Occupational History  . 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 Smoking status: Former Smoker    Packs/day: 0.75    Years: 10.00    Pack years: 7.50    Types:  Cigarettes    Quit date: 2008    Years since quitting: 14.1  . Smokeless tobacco: Never Used  . Tobacco comment: 01/21/2012 "quit smoking ~ 2002"  Vaping Use  . Vaping Use: Never used  Substance and Sexual Activity  . Alcohol use: Yes    Comment: daily coctail  . Drug use: No  . Sexual activity: Not  Currently  Other Topics Concern  . Not on file  Social History Narrative   Lives in [place].  Retired.   Social Determinants of Health   Financial Resource Strain: Not on file  Food Insecurity: Not on file  Transportation Needs: Not on file  Physical Activity: Not on file  Stress: Not on file  Social Connections: Not on file     Family History:  The patient's family history includes CVA in her brother; Heart attack in her brother; Heart disease in her brother, father, and mother; Hypertension in her brother and mother; Stroke in her father and mother.   ROS:   Please see the history of present illness.   All other systems are reviewed and are negative.  PHYSICAL EXAM:   VS:  BP (!) 141/72 (BP Location: Left Arm, Patient Position: Sitting)   Pulse 80   Ht 5\' 4"  (1.626 m)   Wt 150 lb 9.6 oz (68.3 kg)   SpO2 97%   BMI 25.85 kg/m     Wt Readings from Last 3 Encounters:  03/19/20 150 lb 9.6 oz (68.3 kg)  01/01/20 153 lb (69.4 kg)  11/22/19 151 lb (68.5 kg)      General: Alert, oriented x3, no distress, well-healed left subclavian pacemaker site Head: no evidence of trauma, PERRL, EOMI, no exophtalmos or lid lag, no myxedema, no xanthelasma; normal ears, nose and oropharynx Neck: normal jugular venous pulsations and no hepatojugular reflux; brisk carotid pulses without delay and no carotid bruits Chest: clear to auscultation, no signs of consolidation by percussion or palpation, normal fremitus, symmetrical and full respiratory excursions Cardiovascular: normal position and quality of the apical impulse, regular rhythm, normal first and second heart sounds, no murmurs,  rubs or gallops Abdomen: no tenderness or distention, no masses by palpation, no abnormal pulsatility or arterial bruits, normal bowel sounds, no hepatosplenomegaly Extremities: no clubbing, cyanosis or edema; 2+ radial, ulnar and brachial pulses bilaterally; 2+ right femoral, posterior tibial and dorsalis pedis pulses; 2+ left femoral, posterior tibial and dorsalis pedis pulses; no subclavian or femoral bruits Neurological: grossly nonfocal Psych: Normal mood and affect    Studies/Labs Reviewed:   EKG:  EKG is ordered today.  It shows atrial paced, ventricular sensed rhythm, with sagging ST segments and biphasic T waves in multiple leads and a prolonged QTC of 521 ms  Recent Labs: 10/15/2019: Magnesium 2.1 12/20/2019: ALT 15; BUN 13; Creatinine, Ser 0.83; Hemoglobin 14.3; Platelets 301; Potassium 4.6; Sodium 136   Lipid Panel    Component Value Date/Time   CHOL 241 (H) 12/26/2018 1018   TRIG 83 10/10/2019 1921   HDL 71 12/26/2018 1018   CHOLHDL 3.4 12/26/2018 1018   CHOLHDL 2.5 05/12/2015 0324   VLDL 23 05/12/2015 0324   LDLCALC 140 (H) 12/26/2018 1018    ASSESSMENT:    1. PAF (paroxysmal atrial fibrillation) ([place])   2. Encounter for monitoring dofetilide therapy   3. Tachy-brady syndrome ([place])   4. Pacemaker   5. Chronic diastolic heart failure ([place])   6. Coronary artery disease of bypass graft of native heart with stable angina pectoris ([place])   7. PAD (peripheral artery disease) ([place])   8. Cerebrovascular small vessel disease   9. Essential hypertension   10. Hyperparathyroidism ([place])   11. Hypercholesterolemia     PLAN:  In order of problems listed above:  1. AFib: Extremely low burden of arrhythmia since her ablation on treatment with dofetilide.  She has had clipping over left atrial appendage  and is not on anticoagulation.  She does take aspirin and clopidogrel for PAD with stents in both lower extremities.   2. Dofetilide: QTc is markedly prolonged today.  Will  reduce the dofetilide dose to 125 mcg twice daily.  Recheck ECG at her appointment on 04/16/2020. 3. SSS: Virtually 100% atrial paced.  Heart rate histogram at the current sensor settings appear appropriate. 4. PPM:  normal device function on comprehensive interrogation performed today.  She has 5076 atrial and ventricular leads and a Medtronic Adapta generator.  Continue remote downloads every 3 months. 5. CHF: NYHA functional class I and clinically euvolemic.  She has not taking any diuretics in several months.  It is unlikely that her QT prolongation will be related to electrolyte abnormalities. 6. CAD: Currently angina is well controlled with amlodipine.  Beta-blockers appear to worsen reactive airway disease and possibly caused hair loss.  Unfortunately, she had early failure of 2 saphenous vein grafts, one of which was treated with a drug-eluting stent. Her mammary artery bypass was widely patent at the most recent cardiac catheterization in April 2017. 7. PAD: Improved symptoms following her last revascularization procedure in August.  Despite restenosis on one side, she has ABI 1.0 bilaterally and does not have intermittent claudication.  On dual antiplatelet therapy.  Also noted that she has lower blood pressure in the left arm suggesting left subclavian stenosis. 8. ASCVD: She has mostly small vessel disease and the plan is for her to remain on clopidogrel indefinitely.  Avoid hypotension.  Tolerate SBP around 140. 9. HTN: Acceptable BP today.  Blood pressure in the desirable range today (notes that she has lower blood pressure in the left arm and should always check blood pressure on the right). Avoid ACE inhibitors due to history of hyperkalemia while taking lisinopril. 10. 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  Normal calcium levels by most recent labs performed November 2021. 11. Hypercholesterolemia: Intolerant to multiple statins (myalgia, hair loss).   We will start (again) the process of getting her to take Garey.  First need updated labs    Medication Adjustments/Labs and Tests Ordered: Current medicines are reviewed at length with the patient today.  Concerns regarding medicines are outlined above.  Medication changes, Labs and Tests ordered today are listed in the Patient Instructions below. Patient Instructions  Medication Instructions:  DECREASE the Tikosyn to 125 mcg once every 12 hours  *If you need a refill on your cardiac medications before your next appointment, please call your pharmacy*   Lab Work: None ordered If you have labs (blood work) drawn today and your tests are completely normal, you will receive your results only by: Marland Kitchen MyChart Message (if you have MyChart) OR . A paper copy in the mail If you have any lab test that is abnormal or we need to change your treatment, we will call you to review the results.   Testing/Procedures: None ordered   Follow-Up: At [facility], you and your health needs are our priority.  As part of our continuing mission to provide you with exceptional heart care, we have created designated Provider Care Teams.  These Care Teams include your primary Cardiologist (physician) and Advanced Practice Providers (APPs -  Physician Assistants and Nurse Practitioners) who all work together to provide you with the care you need, when you need it.  We recommend signing up for the patient portal called "MyChart".  Sign up information is provided on this After Visit Summary.  MyChart is  used to connect with patients for Virtual Visits (Telemedicine).  Patients are able to view lab/test results, encounter notes, upcoming appointments, etc.  Non-urgent messages can be sent to your provider as well.   To learn more about what you can do with MyChart, go to [email or web address].    Your next appointment:   Keep your appointment on 3/8 at 11:45    Signed, Sanda Klein, MD  03/24/2020 3:30 PM     [address], [place], [place]  [address] Phone: [phone]; Fax: [phone]

## 2020-03-19 NOTE — Patient Instructions (Addendum)
Medication Instructions:  DECREASE the Tikosyn to 125 mcg once every 12 hours  *If you need a refill on your cardiac medications before your next appointment, please call your pharmacy*   Lab Work: None ordered If you have labs (blood work) drawn today and your tests are completely normal, you will receive your results only by: Marland Kitchen MyChart Message (if you have MyChart) OR . A paper copy in the mail If you have any lab test that is abnormal or we need to change your treatment, we will call you to review the results.   Testing/Procedures: None ordered   Follow-Up: At [facility], you and your health needs are our priority.  As part of our continuing mission to provide you with exceptional heart care, we have created designated Provider Care Teams.  These Care Teams include your primary Cardiologist (physician) and Advanced Practice Providers (APPs -  Physician Assistants and Nurse Practitioners) who all work together to provide you with the care you need, when you need it.  We recommend signing up for the patient portal called "MyChart".  Sign up information is provided on this After Visit Summary.  MyChart is used to connect with patients for Virtual Visits (Telemedicine).  Patients are able to view lab/test results, encounter notes, upcoming appointments, etc.  Non-urgent messages can be sent to your provider as well.   To learn more about what you can do with MyChart, go to [email or web address].    Your next appointment:   Keep your appointment on 3/8 at 11:45

## 2020-03-20 MED ORDER — DOFETILIDE 125 MCG PO CAPS
125.0000 ug | ORAL_CAPSULE | Freq: Two times a day (BID) | ORAL | 3 refills | Status: DC
Start: 2020-03-20 — End: 2020-06-04

## 2020-03-24 ENCOUNTER — Encounter: Payer: Self-pay | Admitting: Cardiovascular Disease

## 2020-04-01 ENCOUNTER — Other Ambulatory Visit: Payer: Self-pay | Admitting: Cardiovascular Disease

## 2020-04-15 ENCOUNTER — Telehealth: Payer: Self-pay | Admitting: Cardiovascular Disease

## 2020-04-15 MED ORDER — AMLODIPINE BESYLATE 10 MG PO TABS
10.0000 mg | ORAL_TABLET | Freq: Every day | ORAL | Status: DC
Start: 2020-04-15 — End: 2020-04-16

## 2020-04-15 NOTE — Telephone Encounter (Signed)
Returned the call to the patient. She stated that for the past week she has been having elevated blood pressures with headaches. On Friday she stated that she had a hard time getting her thoughts together. She denied slurred speech and stated that it was more like a memory issue. She stated that she was going to go to the ED but decided last minute not to.   She normally takes Amlodipine 5 mg in the AM and 2.5 mg in the PM. On Sunday she took 5 mg, then another 5 mg an hour later, 5 mg at lunch and then 2.5 mg in the evening.  This morning when she woke up her blood pressure was 179/107. She has not taken her medication yet because she takes it all at 9 am with her Tikosyn.  She denies any changes in diet and stress level and does watch her sodium intake. She did state that she has started Biotin and Zinc and read online that this can interfere with Amlodipine.   She is currently asymptomatic but has been given ED precautions. She has an appointment tomorrow for an EKG due to the decrease in the  tikosyn. She has been advised to make sure and keep that appointment. She has also been advised to be sure to take her blood pressure in the right arm and not the left due to possible stenosis.

## 2020-04-15 NOTE — Telephone Encounter (Signed)
Pt c/o BP issue: STAT if pt c/o blurred vision, one-sided weakness or slurred speech  1. What are your last 5 BP readings? 176/107, 198//110 this morning  2. Are you having any other symptoms (ex. Dizziness, headache, blurred vision, passed out)? Bad headache   3. What is your BP issue? BP is really high

## 2020-04-15 NOTE — Telephone Encounter (Signed)
Spoke with the patient. She will increase her Amlodipine to 10 mg once daily and keep her appointment tomorrow.  Blood pressure while on the phone was 168/92

## 2020-04-15 NOTE — Telephone Encounter (Signed)
OK to increase the amlodipine to 10 mg daily (but taking>10 mg will not likely help). Can we check her BP in office when she comes for ECG please?

## 2020-04-16 ENCOUNTER — Ambulatory Visit: Payer: Medicare HMO | Admitting: Cardiology

## 2020-04-16 ENCOUNTER — Other Ambulatory Visit: Payer: Self-pay

## 2020-04-16 ENCOUNTER — Encounter: Payer: Self-pay | Admitting: Cardiology

## 2020-04-16 VITALS — BP 162/78 | HR 80 | Ht 64.0 in | Wt 150.6 lb

## 2020-04-16 DIAGNOSIS — Z7901 Long term (current) use of anticoagulants: Secondary | ICD-10-CM

## 2020-04-16 DIAGNOSIS — I480 Paroxysmal atrial fibrillation: Secondary | ICD-10-CM

## 2020-04-16 DIAGNOSIS — I25810 Atherosclerosis of coronary artery bypass graft(s) without angina pectoris: Secondary | ICD-10-CM | POA: Diagnosis not present

## 2020-04-16 DIAGNOSIS — I739 Peripheral vascular disease, unspecified: Secondary | ICD-10-CM

## 2020-04-16 DIAGNOSIS — Z951 Presence of aortocoronary bypass graft: Secondary | ICD-10-CM

## 2020-04-16 DIAGNOSIS — I10 Essential (primary) hypertension: Secondary | ICD-10-CM | POA: Diagnosis not present

## 2020-04-16 MED ORDER — VALSARTAN 40 MG PO TABS: 40.0000 mg | ORAL_TABLET | Freq: Every day | ORAL | 3 refills | Status: DC

## 2020-04-16 NOTE — Patient Instructions (Addendum)
Medication Instructions:  START- Valsartan 40 mg by mouth daily  *If you need a refill on your cardiac medications before your next appointment, please call your pharmacy*   Lab Work: None Ordered  Testing/Procedures: None Ordered   Follow-Up: At [facility], you and your health needs are our priority.  As part of our continuing mission to provide you with exceptional heart care, we have created designated Provider Care Teams.  These Care Teams include your primary Cardiologist (physician) and Advanced Practice Providers (APPs -  Physician Assistants and Nurse Practitioners) who all work together to provide you with the care you need, when you need it.  We recommend signing up for the patient portal called "MyChart".  Sign up information is provided on this After Visit Summary.  MyChart is used to connect with patients for Virtual Visits (Telemedicine).  Patients are able to view lab/test results, encounter notes, upcoming appointments, etc.  Non-urgent messages can be sent to your provider as well.   To learn more about what you can do with MyChart, go to [email or web address].    Your next appointment:   2-3 week(s)  The format for your next appointment:   In Person  Provider:   You may see Sanda Klein, MD or one of the following Advanced Practice Providers on your designated Care Team:    Almyra Deforest, PA-C  Fabian Sharp, PA-C or   Roby Lofts, [place]

## 2020-04-16 NOTE — Progress Notes (Signed)
Cardiology Office Note:    Date:  04/16/2020   ID:  [name], DOB 1941/02/02, MRN [id number]  PCP:  [name] Pollen, MD  Cardiologist:  Sanda Klein, MD  Electrophysiologist:  None   Referring MD: [name], *   CC: elevated   History of Present Illness:    [name] is a 80 y.o. female with a hx of coronary bypass surgery (LIMA to LAD, SVG to PDA, SVG to intermedius branch, 35 mm atrial clip by Servando Snare on Nov 26th 2016). She had atrial clipping at the time of her bypass surgery. She had postoperative atrial fibrillation and underwent cardioversion on December 22.  She also has a history of peripheral arterial disease and has previously received stents to the superficial femoral arteries, hypertension, hyperlipidemia. She had postoperative atrial fibrillation but has had atrial fibrillation even before her bypass surgery. She has a dual-chamber permanent pacemaker implant in 2013 for symptomatic bradycardia (Medtronic). She had been on anticoagulation but this was stopped secondary to excessive bruising.          She presented 05/11/15 with [place]. She ruled in for a NSTEMI. The plan was to keep her on Heparin and cath the following Monday but she had unstable pain with hypotension on Sat and Dr Claiborne Billings elected to take her to the cath lab. See his note for complete details. She had an occlusion in the SVG-RCA which was stented as well as the native RCA. The SVG-RI was also diseased and she had competitive flow in the native LAD. The plan is for agressive medical Rx.   In late August 2020 she underwent an abdominal aortic angiogram with runoff, followed by angioplasty and drug coated stenting of a chronic total occlusion of the right superficial femoral artery. Preprocedure ABI was 0.82  Post procedure ABI was 1.1 at follow-up on October 18 2018, 1.0 in March 2021 (bilaterally).  By ultrasound performed in July 2021, known occluded mid right SFA stent with  reconstitution above the level of the popliteal artery and three-vessel runoff, 80% stenosis in the proximal left SFA with widely patent mid left SFA stent.  She is in office today with complaints of elevated blood pressure.  She tells me her blood pressure was elevated last week and she had headache and overall felt poorly.  At home she was on amlodipine 5 mg twice a day.  She was taking an extra 5 mg for her hypertension but did not feel it was working.  In the office today her initial blood pressure was 162/78, repeat blood pressure by me was 144/66.  Past Medical History:  Diagnosis Date  . ANXIETY 12/31/2009  . Aortic insufficiency    a. mod by echo 2017.  Marland Kitchen CAD (coronary artery disease)    a. s/p CABGx3 and LAA clipping in 12/2014, NSTEMI 05/2015 s/p DES to native RCA and SVG-dRCA; occluded ramus-SVG was treated medically). c. neg nuc 10/2015 at [facility].  . Chronic diastolic CHF (congestive heart failure) ([place])   . Chronic fatigue   . CKD (chronic kidney disease), stage II   . DEPRESSION 12/31/2009  . [place] DISEASE, CERVICAL 12/31/2009  . [place] DISEASE, LUMBAR 12/31/2009  . DIVERTICULITIS, HX OF 12/31/2009  . Essential hypertension   . Gait abnormality 03/25/2017  . GLUCOSE INTOLERANCE 12/31/2009  . Habitual alcohol use   . History of stroke    self reports " they say i have had some pin strokes"   . Hypercalcemia   . Hyperkalemia   .  Hyperlipidemia 12/31/2009  . Hypothyroidism   . MENOPAUSE, EARLY 12/31/2009  . NSTEMI (non-ST elevated myocardial infarction) ([place]) 05/11/2015  . Orthostatic hypotension   . OSTEOARTHRITIS, HAND   . Pacemaker 01/21/2012   MDT Adapta dual chamber  . Paroxysmal atrial flutter ([place])   . Persistent atrial fibrillation ([place])   . PVD (peripheral vascular disease), hx stents to bil SFAs 02/2010   . Rash, skin     superficial raised red pencil point sized rash bilateral forearms; states " it started when i started the Plavix "   . Ruptured left breast implant    . Symptomatic sinus bradycardia 01/22/2012  . Syncope    a. 11/2015 ? due to medications.  . Syncope    reports on 05-10-17 " i passed out 2 months ago in the bathroom and broke some ribs"    . Tachy-brady syndrome ([place])    a. s/p MDT PPM 2013.  . Tricuspid regurgitation     Past Surgical History:  Procedure Laterality Date  . ABDOMINAL AORTOGRAM W/LOWER EXTREMITY Bilateral 02/15/2017   Procedure: ABDOMINAL AORTOGRAM W/LOWER EXTREMITY;  Surgeon: Lorretta Harp, MD;  Location: [place] [facility];  Service: Cardiovascular;  Laterality: Bilateral;  bilat  . ABDOMINAL AORTOGRAM W/LOWER EXTREMITY N/A 10/06/2018   Procedure: ABDOMINAL AORTOGRAM W/LOWER EXTREMITY;  Surgeon: Lorretta Harp, MD;  Location: [place] [facility];  Service: Cardiovascular;  Laterality: N/A;  . Ablation of typical atrial flutter (CTI line)  09/05/2016   Dr Antonieta Pert at [facility]   . [place]   Removed 01/2018  . BREAST IMPLANT REMOVAL Bilateral 01/14/2018   Procedure: REMOVAL BILATERAL BREAST IMPLANTS;  Surgeon: Irene Limbo, MD;  Location: [place];  Service: Plastics;  Laterality: Bilateral;  . CAPSULECTOMY Bilateral 01/14/2018   Procedure: CAPSULECTOMY;  Surgeon: Irene Limbo, MD;  Location: [place];  Service: Plastics;  Laterality: Bilateral;  . CARDIAC CATHETERIZATION N/A 01/01/2015   Procedure: Left Heart Cath and Coronary Angiography;  Surgeon: Jettie Booze, MD;  Location: [place] [facility];  Service: Cardiovascular;  Laterality: N/A;  . CARDIAC CATHETERIZATION N/A 05/12/2015   Procedure: Left Heart Cath and Coronary Angiography;  Surgeon: Troy Sine, MD;  Location: [place] [facility];  Service: Cardiovascular;  Laterality: N/A;  . CARDIAC CATHETERIZATION N/A 05/12/2015   Procedure: Coronary Stent Intervention;  Surgeon: Troy Sine, MD;  Location: [place] [facility];  Service: Cardiovascular;  Laterality: N/A;  . CARDIOVERSION N/A  02/01/2015   Procedure: CARDIOVERSION;  Surgeon: Lelon Perla, MD;  Location: [facility] ENDOSCOPY;  Service: Cardiovascular;  Laterality: N/A;  . CARDIOVERSION N/A 08/30/2015   Procedure: CARDIOVERSION;  Surgeon: Sanda Klein, MD;  Location: [place] ENDOSCOPY;  Service: Cardiovascular;  Laterality: N/A;  . CARPAL TUNNEL RELEASE  2008   "right hand/thumb; carpal tunnel repair; got rid of arthritis" (01/21/2012)  . CLIPPING OF ATRIAL APPENDAGE N/A 01/04/2015   Procedure: CLIPPING OF ATRIAL APPENDAGE;  Surgeon: Grace Isaac, MD;  Location: [place];  Service: Open Heart Surgery;  Laterality: N/A;  . COLONOSCOPY N/A 12/21/2015   Procedure: COLONOSCOPY;  Surgeon: Milus Banister, MD;  Location: [facility] ENDOSCOPY;  Service: Endoscopy;  Laterality: N/A;  . CORONARY ARTERY BYPASS GRAFT N/A 01/04/2015   Procedure: CORONARY ARTERY BYPASS GRAFTING (CABG) x 3 using left internal mammory artery and greater saphenous vein right leg harvested endoscopically.;  Surgeon: Grace Isaac, MD;  LIMA-LAD, SVG-RI, SVG-PDA  . ESOPHAGOGASTRODUODENOSCOPY (EGD) WITH PROPOFOL N/A 12/18/2015  Procedure: ESOPHAGOGASTRODUODENOSCOPY (EGD) WITH PROPOFOL;  Surgeon: Milus Banister, MD;  Location: [facility];  Service: Endoscopy;  Laterality: N/A;  . FACELIFT, LOWER 2/3  1995   "mini" (01/21/2012)  . GIVENS CAPSULE STUDY N/A 12/21/2015   Procedure: GIVENS CAPSULE STUDY;  Surgeon: Milus Banister, MD;  Location: [facility];  Service: Endoscopy;  Laterality: N/A;  . Lower Arterial Examination  10/28/2011   R. SFA stent mild-moderate mixed density plaque with elevated velocities consistent with 50% diameter reduction. L. SFA stent moderate mixed denisty plaque at mid to distal level consistent with 50-69% diameter reduction.  . LOWER EXTREMITY ANGIOGRAPHY N/A 02/15/2017   Procedure: LOWER EXTREMITY ANGIOGRAPHY;  Surgeon: Lorretta Harp, MD;  Location: [place] [facility];  Service: Cardiovascular;  Laterality: N/A;  . OOPHORECTOMY   ~1979  . PARATHYROIDECTOMY N/A 05/13/2017   Procedure: PARATHYROIDECTOMY;  Surgeon: Armandina Gemma, MD;  Location: [facility];  Service: General;  Laterality: N/A;  . PARTIAL COLECTOMY  2010  . PERIPHERAL ARTERIAL STENT GRAFT  2012; 2012   "LLE; RLE" (01/21/2012)  . PERIPHERAL VASCULAR BALLOON ANGIOPLASTY Right 02/15/2017   Procedure: PERIPHERAL VASCULAR BALLOON ANGIOPLASTY;  Surgeon: Lorretta Harp, MD;  Location: [place] [facility];  Service: Cardiovascular;  Laterality: Right;  SFA    . PERIPHERAL VASCULAR INTERVENTION Right 10/06/2018   Procedure: PERIPHERAL VASCULAR INTERVENTION;  Surgeon: Lorretta Harp, MD;  Location: [place] [facility];  Service: Cardiovascular;  Laterality: Right;  . PERMANENT PACEMAKER INSERTION N/A 01/21/2012   Medtronic Adapta L implanted by Dr Sallyanne Kuster for tachy/brady syndrome  . POSTERIOR CERVICAL LAMINECTOMY  1985  . TEE WITHOUT CARDIOVERSION N/A 01/04/2015   Procedure: TRANSESOPHAGEAL ECHOCARDIOGRAM (TEE);  Surgeon: Grace Isaac, MD;  Location: [place];  Service: Open Heart Surgery;  Laterality: N/A;  . TEE WITHOUT CARDIOVERSION N/A 02/01/2015   Procedure: TRANSESOPHAGEAL ECHOCARDIOGRAM (TEE);  Surgeon: Lelon Perla, MD;  Location: [facility] ENDOSCOPY;  Service: Cardiovascular;  Laterality: N/A;  . TEE WITHOUT CARDIOVERSION N/A 08/30/2015   Procedure: TRANSESOPHAGEAL ECHOCARDIOGRAM (TEE);  Surgeon: Sanda Klein, MD;  Location: [facility] ENDOSCOPY;  Service: Cardiovascular;  Laterality: N/A;  . VAGINAL HYSTERECTOMY  1975    Current Medications: Current Meds  Medication Sig  . alendronate (FOSAMAX) 70 MG tablet Take 70 mg by mouth every Saturday.  Marland Kitchen amLODipine (NORVASC) 10 MG tablet Take 1 tablet (10 mg total) by mouth daily.  Marland Kitchen aspirin EC 81 MG tablet Take 81 mg by mouth daily.   . Biotin 10000 MCG TABS Take 10,000 mcg by mouth daily.  . Cholecalciferol (VITAMIN D3 PO) Take 1 tablet by mouth daily.  . citalopram (CELEXA) 40 MG tablet TAKE 1 TABLET BY MOUTH EVERY  DAY (Patient taking differently: Take 40 mg by mouth daily.)  . clopidogrel (PLAVIX) 75 MG tablet TAKE 1 TABLET (75 MG TOTAL) BY MOUTH DAILY WITH BREAKFAST.  Marland Kitchen dofetilide (TIKOSYN) 125 MCG capsule Take 1 capsule (125 mcg total) by mouth every 12 (twelve) hours.  . furosemide (LASIX) 40 MG tablet Take 40 mg by mouth daily as needed for fluid or edema.  Marland Kitchen levothyroxine (SYNTHROID, LEVOTHROID) 25 MCG tablet Take 25 mcg by mouth daily before breakfast.   . Magnesium 200 MG TABS Take 200 mg by mouth daily. Patient takes 1 tablet daily  . Multiple Vitamins-Minerals (MULTI-VITE) LIQD Take 1 tablet by mouth daily.   . niacin 250 MG tablet Take 250 mg by mouth daily.  . nitroGLYCERIN (NITROSTAT) 0.4 MG SL tablet Place 1 tablet (0.4  mg total) under the tongue every 5 (five) minutes x 3 doses as needed for chest pain.  Jonna Coup Leaf Extract 250 MG CAPS Take 250 mg by mouth 2 (two) times daily.  . Omega-3 Fatty Acids (OMEGA 3 PO) Take 690 mg by mouth daily.  . rosuvastatin (CRESTOR) 10 MG tablet Take 1 tablet (10 mg total) by mouth daily.  . TURMERIC PO Take 475 mg by mouth daily.  . valsartan (DIOVAN) 40 MG tablet Take 1 tablet (40 mg total) by mouth daily.  . vitamin C (ASCORBIC ACID) 500 MG tablet Take 500 mg by mouth daily.  . [DISCONTINUED] albuterol (VENTOLIN HFA) 108 (90 Base) MCG/ACT inhaler Inhale 1-2 puffs into the lungs every 6 (six) hours as needed for wheezing or shortness of breath.     Allergies:   Brilinta [ticagrelor], Atorvastatin, Clonidine derivatives, Crestor [rosuvastatin calcium], Exforge [amlodipine besylate-valsartan], and Spironolactone   Social History   Socioeconomic History  . Marital status: Widowed    Spouse name: Not on file  . Number of children: 3  . Years of education: Not on file  . Highest education level: Not on file  Occupational History  . 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 Smoking status: Former Smoker    Packs/day:  0.75    Years: 10.00    Pack years: 7.50    Types: Cigarettes    Quit date: 2008    Years since quitting: 14.1  . Smokeless tobacco: Never Used  . Tobacco comment: 01/21/2012 "quit smoking ~ 2002"  Vaping Use  . Vaping Use: Never used  Substance and Sexual Activity  . Alcohol use: Yes    Comment: daily coctail  . Drug use: No  . Sexual activity: Not Currently  Other Topics Concern  . Not on file  Social History Narrative   Lives in [place].  Retired.   Social Determinants of Health   Financial Resource Strain: Not on file  Food Insecurity: Not on file  Transportation Needs: Not on file  Physical Activity: Not on file  Stress: Not on file  Social Connections: Not on file     Family History: The patient's family history includes CVA in her brother; Heart attack in her brother; Heart disease in her brother, father, and mother; Hypertension in her brother and mother; Stroke in her father and mother.  ROS:   Please see the history of present illness.     All other systems reviewed and are negative.  EKGs/Labs/Other Studies Reviewed:    The following studies were reviewed today: Echo 08/09/2020- IMPRESSIONS    1. Left ventricular ejection fraction, by estimation, is 60 to 65%. The  left ventricle has normal function. The left ventricle has no regional  wall motion abnormalities. Left ventricular diastolic parameters are  consistent with Grade II diastolic  dysfunction (pseudonormalization). Elevated left atrial pressure. The  average left ventricular global longitudinal strain is -20.0 %. The global  longitudinal strain is normal.  2. Right ventricular systolic function is normal. The right ventricular  size is normal. There is mildly elevated pulmonary artery systolic  pressure. The estimated right ventricular systolic pressure is 93.8 mmHg.  3. Left atrial size was mildly dilated.  4. Right atrial size was mildly dilated.  5. The mitral valve is normal in  structure. Mild mitral valve  regurgitation. No evidence of mitral stenosis.  6. Tricuspid valve regurgitation is moderate.  7. The aortic valve is normal in structure. Aortic  valve regurgitation is  mild. No aortic stenosis is present.  8. The inferior vena cava is dilated in size with >50% respiratory  variability, suggesting right atrial pressure of 8 mmHg.   EKG:  EKG is  ordered today.  The ekg ordered today demonstrates paced  Recent Labs: 10/15/2019: Magnesium 2.1 12/20/2019: ALT 15; BUN 13; Creatinine, Ser 0.83; Hemoglobin 14.3; Platelets 301; Potassium 4.6; Sodium 136  Recent Lipid Panel    Component Value Date/Time   CHOL 241 (H) 12/26/2018 1018   TRIG 83 10/10/2019 1921   HDL 71 12/26/2018 1018   CHOLHDL 3.4 12/26/2018 1018   CHOLHDL 2.5 05/12/2015 0324   VLDL 23 05/12/2015 0324   LDLCALC 140 (H) 12/26/2018 1018    Physical Exam:    VS:  BP (!) 162/78   Pulse 80   Ht 5\' 4"  (1.626 m)   Wt 150 lb 9.6 oz (68.3 kg)   SpO2 97%   BMI 25.85 kg/m     Wt Readings from Last 3 Encounters:  04/16/20 150 lb 9.6 oz (68.3 kg)  03/19/20 150 lb 9.6 oz (68.3 kg)  01/01/20 153 lb (69.4 kg)     GEN: Well nourished, well developed in no acute distress HEENT: Normal NECK: No JVD CARDIAC: RRR, no murmurs, rubs, gallops RESPIRATORY:  Clear to auscultation without rales, wheezing or rhonchi  ABDOMEN: Soft, non-tender, non-distended MUSCULOSKELETAL:  No edema; No deformity  SKIN: Warm and dry NEUROLOGIC:  Alert and oriented x 3 PSYCHIATRIC:  Normal affect   ASSESSMENT:    HTN- Poor control. Add Diovan 40 mg, check B/P in two weeks.  H/O NSTEMI (non-ST elevated myocardial infarction) ([place]) Discharged 05/15/15 after NSTEMI- SVG-RCA DES, native RCA DES  S/P CABG x 3 01/04/15 S/P LIMA-LAD, SVG-RCA, SVG-RI  PVD (peripheral vascular disease), hx stents to bil SFAs 02/2010 To se Dr Gwenlyn Found in follow up  PAF (paroxysmal atrial fibrillation) ([place]) She had atrial clipping at  the time of her CABG-she is on Tikosyn.(she is not anticoagulated secondary to bruising)  S/P placement of cardiac pacemaker, medtronic adapta 01/21/12 .  PLAN:    As above- add Diovan 40 mg - b/P f/u 2-3 weeks with comparison to her home cuff.     Medication Adjustments/Labs and Tests Ordered: Current medicines are reviewed at length with the patient today.  Concerns regarding medicines are outlined above.  Orders Placed This Encounter  Procedures  . EKG 12-Lead   Meds ordered this encounter  Medications  . valsartan (DIOVAN) 40 MG tablet    Sig: Take 1 tablet (40 mg total) by mouth daily.    Dispense:  90 tablet    Refill:  3    There are no Patient Instructions on file for this visit.   Angelena Form, PA-C  04/16/2020 12:23 PM    [facility]

## 2020-04-18 DIAGNOSIS — R7302 Impaired glucose tolerance (oral): Secondary | ICD-10-CM | POA: Diagnosis not present

## 2020-04-18 DIAGNOSIS — R5382 Chronic fatigue, unspecified: Secondary | ICD-10-CM | POA: Diagnosis not present

## 2020-04-18 DIAGNOSIS — E039 Hypothyroidism, unspecified: Secondary | ICD-10-CM | POA: Diagnosis not present

## 2020-04-18 DIAGNOSIS — I5032 Chronic diastolic (congestive) heart failure: Secondary | ICD-10-CM | POA: Diagnosis not present

## 2020-04-22 DIAGNOSIS — Z7901 Long term (current) use of anticoagulants: Secondary | ICD-10-CM | POA: Diagnosis not present

## 2020-04-22 DIAGNOSIS — R933 Abnormal findings on diagnostic imaging of other parts of digestive tract: Secondary | ICD-10-CM | POA: Diagnosis not present

## 2020-04-22 DIAGNOSIS — R0902 Hypoxemia: Secondary | ICD-10-CM | POA: Diagnosis not present

## 2020-04-22 DIAGNOSIS — K5900 Constipation, unspecified: Secondary | ICD-10-CM | POA: Diagnosis not present

## 2020-04-22 DIAGNOSIS — K920 Hematemesis: Secondary | ICD-10-CM | POA: Diagnosis not present

## 2020-04-22 DIAGNOSIS — E876 Hypokalemia: Secondary | ICD-10-CM | POA: Diagnosis not present

## 2020-04-22 DIAGNOSIS — R52 Pain, unspecified: Secondary | ICD-10-CM | POA: Diagnosis not present

## 2020-04-22 DIAGNOSIS — R1084 Generalized abdominal pain: Secondary | ICD-10-CM | POA: Diagnosis not present

## 2020-04-22 DIAGNOSIS — Z743 Need for continuous supervision: Secondary | ICD-10-CM | POA: Diagnosis not present

## 2020-04-22 DIAGNOSIS — Z79899 Other long term (current) drug therapy: Secondary | ICD-10-CM | POA: Diagnosis not present

## 2020-04-22 DIAGNOSIS — E871 Hypo-osmolality and hyponatremia: Secondary | ICD-10-CM | POA: Diagnosis not present

## 2020-04-22 DIAGNOSIS — R109 Unspecified abdominal pain: Secondary | ICD-10-CM | POA: Diagnosis not present

## 2020-04-22 DIAGNOSIS — Z7982 Long term (current) use of aspirin: Secondary | ICD-10-CM | POA: Diagnosis not present

## 2020-05-07 ENCOUNTER — Other Ambulatory Visit: Payer: Self-pay | Admitting: Emergency Medicine

## 2020-05-07 ENCOUNTER — Other Ambulatory Visit: Payer: Self-pay | Admitting: *Deleted

## 2020-05-07 MED ORDER — CITALOPRAM HYDROBROMIDE 40 MG PO TABS
40.0000 mg | ORAL_TABLET | Freq: Every day | ORAL | 1 refills | Status: DC
Start: 2020-05-07 — End: 2021-03-10

## 2020-05-07 NOTE — Telephone Encounter (Signed)
Routing to CMA 

## 2020-05-07 NOTE — Telephone Encounter (Signed)
   Notes to clinic Not a provider we approve rx for.  

## 2020-05-08 ENCOUNTER — Other Ambulatory Visit: Payer: Self-pay

## 2020-05-08 ENCOUNTER — Other Ambulatory Visit: Payer: Self-pay | Admitting: Cardiovascular Disease

## 2020-05-08 ENCOUNTER — Ambulatory Visit: Payer: Medicare HMO | Admitting: Physician Assistant

## 2020-05-08 ENCOUNTER — Encounter: Payer: Self-pay | Admitting: Physician Assistant

## 2020-05-08 VITALS — BP 172/96 | HR 86 | Ht 64.0 in | Wt 146.4 lb

## 2020-05-08 DIAGNOSIS — I25810 Atherosclerosis of coronary artery bypass graft(s) without angina pectoris: Secondary | ICD-10-CM | POA: Diagnosis not present

## 2020-05-08 DIAGNOSIS — I739 Peripheral vascular disease, unspecified: Secondary | ICD-10-CM

## 2020-05-08 DIAGNOSIS — I701 Atherosclerosis of renal artery: Secondary | ICD-10-CM | POA: Diagnosis not present

## 2020-05-08 DIAGNOSIS — Z950 Presence of cardiac pacemaker: Secondary | ICD-10-CM | POA: Diagnosis not present

## 2020-05-08 DIAGNOSIS — I10 Essential (primary) hypertension: Secondary | ICD-10-CM

## 2020-05-08 DIAGNOSIS — I4892 Unspecified atrial flutter: Secondary | ICD-10-CM | POA: Diagnosis not present

## 2020-05-08 MED ORDER — CARVEDILOL 6.25 MG PO TABS: 6.2500 mg | ORAL_TABLET | Freq: Two times a day (BID) | ORAL | 5 refills | Status: DC

## 2020-05-08 MED ORDER — CARVEDILOL 6.25 MG PO TABS: 6.2500 mg | ORAL_TABLET | Freq: Every day | ORAL | 6 refills | Status: DC

## 2020-05-08 NOTE — Patient Instructions (Signed)
Medication Instructions:  START CARVEDILOL 6.25MG  DAILY  HAO WILL CALL YOU LATER THIS AFTERNOON. *If you need a refill on your cardiac medications before your next appointment, please call your pharmacy*  Lab Work:   Testing/Procedures:  NONE    NONE  Follow-Up: Your next appointment:  4 week(s) In Person with You will see one of the following Advanced Practice Providers on your designated Care Team:  Almyra Deforest, PA-C  Fabian Sharp, [place] or Roby Lofts, PA-C  At [facility], you and your health needs are our priority.  As part of our continuing mission to provide you with exceptional heart care, we have created designated Provider Care Teams.  These Care Teams include your primary Cardiologist (physician) and Advanced Practice Providers (APPs -  Physician Assistants and Nurse Practitioners) who all work together to provide you with the care you need, when you need it.

## 2020-05-08 NOTE — Progress Notes (Signed)
Cardiology Office Note:    Date:  05/09/2020   ID:  [name], DOB 08/07/1940, MRN [id number]  PCP:  Horald Pollen, [place]  Cardiologist:  Sanda Klein, MD  Advanced Practice Provider:  No care team member to display Electrophysiologist:  None   Referring MD: Horald Pollen, *   Chief Complaint  Patient presents with  . Follow-up    Seen for Dr. Sallyanne Kuster    History of Present Illness:    [name] is a 80 y.o. female with a hx of CAD s/p CABG 12/2014 and DES to native RCA and SVG-RCA 05/2015, history of symptomatic bradycardia s/p Medtronic dual-chamber pacemaker in 2013, atrial fibrillation/atrial flutter s/p LAA clipping, radiofrequency ablation of cavotricuspid isthmus and pulmonary vein isolation, PAD s/p previous stents, intracranial atherosclerotic disease, history of GI bleed on anticoagulation therapy, and hypertension.  She has hyperkalemia with ACE inhibitor in the past.  Amiodarone causes hypothyroidism and alopecia.  She has tried multiple statins however could not tolerate that due to musculoskeletal problem.  She had COVID-19 infection in August 2021.  She underwent abdominal aorta angiogram with runoff via August 2020 followed by angioplasty and drug-coated stenting of chronic total occlusion of the right superficial femoral artery.  Follow-up ultrasound in July 2021 revealed occluded mid right SFA stent with reconstitution above the level popliteal artery, 80% stenosis in proximal left SFA with widely patent mid SFA stent. CT of abdomen obtained in November 2021 showed high-grade proximal right renal artery stenosis, left renal artery was normal.  Patient was last seen by Dr. Sallyanne Kuster in February 2022, she had a very low A. fib burden on pacemaker interrogation at the time.  She was noted to have prolonged QT, Tikosyn was decreased.  She was seen by by Kerin Ransom on 04/16/2020, blood pressure was elevated, Diovan 40  mg was added to her medical regimen.   Since the last time she was seen, she presented to [facility] ED 2 weeks ago due to abdominal pain.  Creatinine did go up to 1.11, which is higher than her usual 0.8.  Abdominal CT does show possible right renal artery stenosis again with small right renal artery comparing to the left renal artery.  She presented today for evaluation of blood pressure.  Systolic blood pressure remained in the 160s to 170s with occasional peak in the 180s range at home.  I recommend the addition of carvedilol 6.25 mg twice daily dosing.  I am hesitant to increase Diovan due to effect on the renal artery.   I discussed the case with Dr. Sallyanne Kuster DOD, he has also reviewed the patient's previous CT of abdomen and the pelvis, patient has severe right renal artery stenosis that may be contributing to her resistant hypertension.  Dr. Sallyanne Kuster recommend set up appointment with Dr. Gwenlyn Found.   Past Medical History:  Diagnosis Date  . ANXIETY 12/31/2009  . Aortic insufficiency    a. mod by echo 2017.  Marland Kitchen CAD (coronary artery disease)    a. s/p CABGx3 and LAA clipping in 12/2014, NSTEMI 05/2015 s/p DES to native RCA and SVG-dRCA; occluded ramus-SVG was treated medically). c. neg nuc 10/2015 at [facility].  . Chronic diastolic CHF (congestive heart failure) ([place])   . Chronic fatigue   . CKD (chronic kidney disease), stage II   . DEPRESSION 12/31/2009  . [place] DISEASE, CERVICAL 12/31/2009  . [place] DISEASE, LUMBAR 12/31/2009  . DIVERTICULITIS, HX OF 12/31/2009  .  Essential hypertension   . Gait abnormality 03/25/2017  . GLUCOSE INTOLERANCE 12/31/2009  . Habitual alcohol use   . History of stroke    self reports " they say i have had some pin strokes"   . Hypercalcemia   . Hyperkalemia   . Hyperlipidemia 12/31/2009  . Hypothyroidism   . MENOPAUSE, EARLY 12/31/2009  . NSTEMI (non-ST elevated myocardial infarction) ([place]) 05/11/2015  . Orthostatic hypotension   . OSTEOARTHRITIS, HAND   .  Pacemaker 01/21/2012   MDT Adapta dual chamber  . Paroxysmal atrial flutter ([place])   . Persistent atrial fibrillation ([place])   . PVD (peripheral vascular disease), hx stents to bil SFAs 02/2010   . Rash, skin     superficial raised red pencil point sized rash bilateral forearms; states " it started when i started the Plavix "   . Ruptured left breast implant   . Symptomatic sinus bradycardia 01/22/2012  . Syncope    a. 11/2015 ? due to medications.  . Syncope    reports on 05-10-17 " i passed out 2 months ago in the bathroom and broke some ribs"    . Tachy-brady syndrome ([place])    a. s/p MDT PPM 2013.  . Tricuspid regurgitation     Past Surgical History:  Procedure Laterality Date  . ABDOMINAL AORTOGRAM W/LOWER EXTREMITY Bilateral 02/15/2017   Procedure: ABDOMINAL AORTOGRAM W/LOWER EXTREMITY;  Surgeon: Lorretta Harp, MD;  Location: [place] CV LAB;  Service: Cardiovascular;  Laterality: Bilateral;  bilat  . ABDOMINAL AORTOGRAM W/LOWER EXTREMITY N/A 10/06/2018   Procedure: ABDOMINAL AORTOGRAM W/LOWER EXTREMITY;  Surgeon: Lorretta Harp, MD;  Location: [place] [facility];  Service: Cardiovascular;  Laterality: N/A;  . Ablation of typical atrial flutter (CTI line)  09/05/2016   Dr Antonieta Pert at [facility]   . [place]   Removed 01/2018  . BREAST IMPLANT REMOVAL Bilateral 01/14/2018   Procedure: REMOVAL BILATERAL BREAST IMPLANTS;  Surgeon: Irene Limbo, MD;  Location: [place];  Service: Plastics;  Laterality: Bilateral;  . CAPSULECTOMY Bilateral 01/14/2018   Procedure: CAPSULECTOMY;  Surgeon: Irene Limbo, MD;  Location: [place];  Service: Plastics;  Laterality: Bilateral;  . CARDIAC CATHETERIZATION N/A 01/01/2015   Procedure: Left Heart Cath and Coronary Angiography;  Surgeon: Jettie Booze, MD;  Location: [place] [facility];  Service: Cardiovascular;  Laterality: N/A;  . CARDIAC CATHETERIZATION N/A 05/12/2015    Procedure: Left Heart Cath and Coronary Angiography;  Surgeon: Troy Sine, MD;  Location: [place] [facility];  Service: Cardiovascular;  Laterality: N/A;  . CARDIAC CATHETERIZATION N/A 05/12/2015   Procedure: Coronary Stent Intervention;  Surgeon: Troy Sine, MD;  Location: [place] [facility];  Service: Cardiovascular;  Laterality: N/A;  . CARDIOVERSION N/A 02/01/2015   Procedure: CARDIOVERSION;  Surgeon: Lelon Perla, MD;  Location: [facility] ENDOSCOPY;  Service: Cardiovascular;  Laterality: N/A;  . CARDIOVERSION N/A 08/30/2015   Procedure: CARDIOVERSION;  Surgeon: Sanda Klein, MD;  Location: [place] ENDOSCOPY;  Service: Cardiovascular;  Laterality: N/A;  . CARPAL TUNNEL RELEASE  2008   "right hand/thumb; carpal tunnel repair; got rid of arthritis" (01/21/2012)  . CLIPPING OF ATRIAL APPENDAGE N/A 01/04/2015   Procedure: CLIPPING OF ATRIAL APPENDAGE;  Surgeon: Grace Isaac, MD;  Location: [place];  Service: Open Heart Surgery;  Laterality: N/A;  . COLONOSCOPY N/A 12/21/2015   Procedure: COLONOSCOPY;  Surgeon: Milus Banister, MD;  Location: [facility] ENDOSCOPY;  Service: Endoscopy;  Laterality: N/A;  .  CORONARY ARTERY BYPASS GRAFT N/A 01/04/2015   Procedure: CORONARY ARTERY BYPASS GRAFTING (CABG) x 3 using left internal mammory artery and greater saphenous vein right leg harvested endoscopically.;  Surgeon: Grace Isaac, MD;  LIMA-LAD, SVG-RI, SVG-PDA  . ESOPHAGOGASTRODUODENOSCOPY (EGD) WITH PROPOFOL N/A 12/18/2015   Procedure: ESOPHAGOGASTRODUODENOSCOPY (EGD) WITH PROPOFOL;  Surgeon: Milus Banister, MD;  Location: [facility] ENDOSCOPY;  Service: Endoscopy;  Laterality: N/A;  . FACELIFT, LOWER 2/3  1995   "mini" (01/21/2012)  . GIVENS CAPSULE STUDY N/A 12/21/2015   Procedure: GIVENS CAPSULE STUDY;  Surgeon: Milus Banister, MD;  Location: [facility] ENDOSCOPY;  Service: Endoscopy;  Laterality: N/A;  . Lower Arterial Examination  10/28/2011   R. SFA stent mild-moderate mixed density plaque with elevated  velocities consistent with 50% diameter reduction. L. SFA stent moderate mixed denisty plaque at mid to distal level consistent with 50-69% diameter reduction.  . LOWER EXTREMITY ANGIOGRAPHY N/A 02/15/2017   Procedure: LOWER EXTREMITY ANGIOGRAPHY;  Surgeon: Lorretta Harp, MD;  Location: [place] [facility];  Service: Cardiovascular;  Laterality: N/A;  . OOPHORECTOMY  ~1979  . PARATHYROIDECTOMY N/A 05/13/2017   Procedure: PARATHYROIDECTOMY;  Surgeon: Armandina Gemma, MD;  Location: [facility];  Service: General;  Laterality: N/A;  . PARTIAL COLECTOMY  2010  . PERIPHERAL ARTERIAL STENT GRAFT  2012; 2012   "LLE; RLE" (01/21/2012)  . PERIPHERAL VASCULAR BALLOON ANGIOPLASTY Right 02/15/2017   Procedure: PERIPHERAL VASCULAR BALLOON ANGIOPLASTY;  Surgeon: Lorretta Harp, MD;  Location: [place] [facility];  Service: Cardiovascular;  Laterality: Right;  SFA    . PERIPHERAL VASCULAR INTERVENTION Right 10/06/2018   Procedure: PERIPHERAL VASCULAR INTERVENTION;  Surgeon: Lorretta Harp, MD;  Location: [place] [facility];  Service: Cardiovascular;  Laterality: Right;  . PERMANENT PACEMAKER INSERTION N/A 01/21/2012   Medtronic Adapta L implanted by Dr Sallyanne Kuster for tachy/brady syndrome  . POSTERIOR CERVICAL LAMINECTOMY  1985  . TEE WITHOUT CARDIOVERSION N/A 01/04/2015   Procedure: TRANSESOPHAGEAL ECHOCARDIOGRAM (TEE);  Surgeon: Grace Isaac, MD;  Location: [place];  Service: Open Heart Surgery;  Laterality: N/A;  . TEE WITHOUT CARDIOVERSION N/A 02/01/2015   Procedure: TRANSESOPHAGEAL ECHOCARDIOGRAM (TEE);  Surgeon: Lelon Perla, MD;  Location: [facility] ENDOSCOPY;  Service: Cardiovascular;  Laterality: N/A;  . TEE WITHOUT CARDIOVERSION N/A 08/30/2015   Procedure: TRANSESOPHAGEAL ECHOCARDIOGRAM (TEE);  Surgeon: Sanda Klein, MD;  Location: [facility]OSCOPY;  Service: Cardiovascular;  Laterality: N/A;  . VAGINAL HYSTERECTOMY  1975    Current Medications: Current Meds  Medication Sig  . alendronate (FOSAMAX) 70  MG tablet Take 70 mg by mouth every Saturday.  Marland Kitchen aspirin EC 81 MG tablet Take 81 mg by mouth daily.   . Biotin 10000 MCG TABS Take 10,000 mcg by mouth daily.  . Cholecalciferol (VITAMIN D3 PO) Take 1 tablet by mouth daily.  . citalopram (CELEXA) 40 MG tablet Take 1 tablet (40 mg total) by mouth daily.  . clopidogrel (PLAVIX) 75 MG tablet TAKE 1 TABLET (75 MG TOTAL) BY MOUTH DAILY WITH BREAKFAST.  Marland Kitchen dofetilide (TIKOSYN) 125 MCG capsule Take 1 capsule (125 mcg total) by mouth every 12 (twelve) hours.  . furosemide (LASIX) 40 MG tablet Take 40 mg by mouth daily as needed for fluid or edema.  Marland Kitchen levothyroxine (SYNTHROID, LEVOTHROID) 25 MCG tablet Take 25 mcg by mouth daily before breakfast.   . Magnesium 200 MG TABS Take 200 mg by mouth daily. Patient takes 1 tablet daily  . Multiple Vitamins-Minerals (MULTI-VITE) LIQD Take 1 tablet by mouth  daily.   . niacin 250 MG tablet Take 250 mg by mouth daily.  . nitroGLYCERIN (NITROSTAT) 0.4 MG SL tablet Place 1 tablet (0.4 mg total) under the tongue every 5 (five) minutes x 3 doses as needed for chest pain.  Jonna Coup Leaf Extract 250 MG CAPS Take 250 mg by mouth 2 (two) times daily.  . rosuvastatin (CRESTOR) 10 MG tablet Take 1 tablet (10 mg total) by mouth daily.  . valsartan (DIOVAN) 40 MG tablet Take 1 tablet (40 mg total) by mouth daily.  . vitamin C (ASCORBIC ACID) 500 MG tablet Take 500 mg by mouth daily.  . [DISCONTINUED] amLODipine (NORVASC) 5 MG tablet Take 5 mg by mouth in the morning and at bedtime.  . [DISCONTINUED] carvedilol (COREG) 6.25 MG tablet Take 1 tablet (6.25 mg total) by mouth daily.     Allergies:   Brilinta [ticagrelor], Atorvastatin, Clonidine derivatives, Crestor [rosuvastatin calcium], Exforge [amlodipine besylate-valsartan], and Spironolactone   Social History   Socioeconomic History  . Marital status: Widowed    Spouse name: Not on file  . Number of children: 3  . Years of education: Not on file  . Highest education  level: Not on file  Occupational History  . 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 Smoking status: Former Smoker    Packs/day: 0.75    Years: 10.00    Pack years: 7.50    Types: Cigarettes    Quit date: 2008    Years since quitting: 14.2  . Smokeless tobacco: Never Used  . Tobacco comment: 01/21/2012 "quit smoking ~ 2002"  Vaping Use  . Vaping Use: Never used  Substance and Sexual Activity  . Alcohol use: Yes    Comment: daily coctail  . Drug use: No  . Sexual activity: Not Currently  Other Topics Concern  . Not on file  Social History Narrative   Lives in [place].  Retired.   Social Determinants of Health   Financial Resource Strain: Not on file  Food Insecurity: Not on file  Transportation Needs: Not on file  Physical Activity: Not on file  Stress: Not on file  Social Connections: Not on file     Family History: The patient's family history includes CVA in her brother; Heart attack in her brother; Heart disease in her brother, father, and mother; Hypertension in her brother and mother; Stroke in her father and mother.  ROS:   Please see the history of present illness.     All other systems reviewed and are negative.  EKGs/Labs/Other Studies Reviewed:    The following studies were reviewed today:  Echo 08/10/2019 1. Left ventricular ejection fraction, by estimation, is 60 to 65%. The  left ventricle has normal function. The left ventricle has no regional  wall motion abnormalities. Left ventricular diastolic parameters are  consistent with Grade II diastolic  dysfunction (pseudonormalization). Elevated left atrial pressure. The  average left ventricular global longitudinal strain is -20.0 %. The global  longitudinal strain is normal.  2. Right ventricular systolic function is normal. The right ventricular  size is normal. There is mildly elevated pulmonary artery systolic  pressure. The estimated right ventricular  systolic pressure is 00.8 mmHg.  3. Left atrial size was mildly dilated.  4. Right atrial size was mildly dilated.  5. The mitral valve is normal in structure. Mild mitral valve  regurgitation. No evidence of mitral stenosis.  6. Tricuspid valve regurgitation is moderate.  7. The aortic  valve is normal in structure. Aortic valve regurgitation is  mild. No aortic stenosis is present.  8. The inferior vena cava is dilated in size with >50% respiratory variability, suggesting right atrial pressure of 8 mmHg.   EKG:  EKG is not ordered today.    Recent Labs: 10/15/2019: Magnesium 2.1 12/20/2019: ALT 15; BUN 13; Creatinine, Ser 0.83; Hemoglobin 14.3; Platelets 301; Potassium 4.6; Sodium 136  Recent Lipid Panel    Component Value Date/Time   CHOL 241 (H) 12/26/2018 1018   TRIG 83 10/10/2019 1921   HDL 71 12/26/2018 1018   CHOLHDL 3.4 12/26/2018 1018   CHOLHDL 2.5 05/12/2015 0324   VLDL 23 05/12/2015 0324   LDLCALC 140 (H) 12/26/2018 1018     Risk Assessment/Calculations:    CHA2DS2-VASc Score = 5  This indicates a 7.2% annual risk of stroke. The patient's score is based upon: CHF History: No HTN History: Yes Diabetes History: No Stroke History: No Vascular Disease History: Yes Age Score: 2 Gender Score: 1      Physical Exam:    VS:  BP (!) 172/96 (BP Location: Left Arm, Patient Position: Sitting, Cuff Size: Normal)   Pulse 86   Ht 5\' 4"  (1.626 m)   Wt 146 lb 6.4 oz (66.4 kg)   SpO2 99%   BMI 25.13 kg/m     Wt Readings from Last 3 Encounters:  05/08/20 146 lb 6.4 oz (66.4 kg)  04/16/20 150 lb 9.6 oz (68.3 kg)  03/19/20 150 lb 9.6 oz (68.3 kg)     GEN:  Well nourished, well developed in no acute distress HEENT: Normal NECK: No JVD; No carotid bruits LYMPHATICS: No lymphadenopathy CARDIAC: RRR, no murmurs, rubs, gallops RESPIRATORY:  Clear to auscultation without rales, wheezing or rhonchi  ABDOMEN: Soft, non-tender, non-distended MUSCULOSKELETAL:  No  edema; No deformity  SKIN: Warm and dry NEUROLOGIC:  Alert and oriented x 3 PSYCHIATRIC:  Normal affect   ASSESSMENT:    1. Primary hypertension   2. Coronary artery disease involving coronary bypass graft of native heart without angina pectoris   3. Pacemaker   4. Atrial flutter, unspecified type ([place])   5. PAD (peripheral artery disease) ([place])   6. Renal artery stenosis ([facility])    PLAN:    In order of problems listed above:  1. Hypertension: Blood pressure continue to be quite elevated after addition of Diovan.  I reviewed the previous CTA of chest abdomen with Dr. Sallyanne Kuster, patient's primary cardiologist.  Patient appears to have severe right renal artery stenosis which may help explain the resistant hypertension.  Most recent CT of abdomen obtained at [facility] also shows the size of the right kidney is decreased compared to the left side.  I recommended addition of carvedilol 6.25 mg twice a day  2. Coronary artery disease s/p CABG: Denies any recent chest pain  3. Symptomatic bradycardia s/p Medtronic dual-chamber pacemaker: Followed by Dr. Sallyanne Kuster.  Primarily atrial paced  4. History of PAF and atrial flutter: Very low A. fib burden  5. PAD: Previously seen by Dr. Alvester Chou for lower extremity disease  6. Renal artery stenosis: Recent CT imaging obtained at [facility] showed possible right renal artery stenosis and normal left renal artery, right kidney appears to be smaller than the left.  Looking back further, patient had another CTA in November 2021 that showed severe right renal artery stenosis.  This may help explain her resistant hypertension.  Will refer to Dr. Gwenlyn Found to see if renal artery  stenting is reasonable.  Case has been discussed with Dr. Sallyanne Kuster, the patient's primary cardiologist.        Medication Adjustments/Labs and Tests Ordered: Current medicines are reviewed at length with the patient today.  Concerns regarding medicines are outlined above.  No  orders of the defined types were placed in this encounter.  Meds ordered this encounter  Medications  . DISCONTD: carvedilol (COREG) 6.25 MG tablet    Sig: Take 1 tablet (6.25 mg total) by mouth daily.    Dispense:  30 tablet    Refill:  6  . carvedilol (COREG) 6.25 MG tablet    Sig: Take 1 tablet (6.25 mg total) by mouth 2 (two) times daily with a meal.    Dispense:  60 tablet    Refill:  5    Order Specific Question:   Supervising Provider    Answer:   Lelon Perla [[id number]]    Patient Instructions  Medication Instructions:  START CARVEDILOL 6.25MG  DAILY  Yuriel Lopezmartinez WILL CALL YOU LATER THIS AFTERNOON. *If you need a refill on your cardiac medications before your next appointment, please call your pharmacy*  Lab Work:   Testing/Procedures:  NONE    NONE  Follow-Up: Your next appointment:  4 week(s) In Person with You will see one of the following Advanced Practice Providers on your designated Care Team:  Almyra Deforest, PA-C  Fabian Sharp, [place] or Roby Lofts, PA-C  At [facility], you and your health needs are our priority.  As part of our continuing mission to provide you with exceptional heart care, we have created designated Provider Care Teams.  These Care Teams include your primary Cardiologist (physician) and Advanced Practice Providers (APPs -  Physician Assistants and Nurse Practitioners) who all work together to provide you with the care you need, when you need it.     Hilbert Corrigan, [place]  05/09/2020 8:45 PM    [facility]

## 2020-05-09 ENCOUNTER — Encounter: Payer: Self-pay | Admitting: Physician Assistant

## 2020-05-09 ENCOUNTER — Ambulatory Visit (HOSPITAL_COMMUNITY)
Admission: RE | Admit: 2020-05-09 | Discharge: 2020-05-09 | Disposition: A | Discharge status: Home / Self Care | Payer: Medicare HMO | Source: Ambulatory Visit | Attending: Cardiology | Admitting: Cardiology

## 2020-05-09 ENCOUNTER — Other Ambulatory Visit (HOSPITAL_COMMUNITY): Payer: Self-pay | Admitting: Cardiovascular Disease

## 2020-05-09 DIAGNOSIS — I739 Peripheral vascular disease, unspecified: Secondary | ICD-10-CM | POA: Diagnosis not present

## 2020-05-09 DIAGNOSIS — Z9862 Peripheral vascular angioplasty status: Secondary | ICD-10-CM

## 2020-05-30 ENCOUNTER — Encounter: Payer: Self-pay | Admitting: Emergency Medicine

## 2020-05-30 ENCOUNTER — Other Ambulatory Visit: Payer: Self-pay

## 2020-05-30 ENCOUNTER — Ambulatory Visit (INDEPENDENT_AMBULATORY_CARE_PROVIDER_SITE_OTHER): Payer: Medicare HMO | Admitting: Emergency Medicine

## 2020-05-30 VITALS — BP 110/62 | HR 84 | Temp 99.1°F | Ht 64.0 in | Wt 147.0 lb

## 2020-05-30 DIAGNOSIS — M489 Spondylopathy, unspecified: Secondary | ICD-10-CM | POA: Diagnosis not present

## 2020-05-30 DIAGNOSIS — I25810 Atherosclerosis of coronary artery bypass graft(s) without angina pectoris: Secondary | ICD-10-CM

## 2020-05-30 DIAGNOSIS — M5412 Radiculopathy, cervical region: Secondary | ICD-10-CM

## 2020-05-30 DIAGNOSIS — Z23 Encounter for immunization: Secondary | ICD-10-CM

## 2020-05-30 DIAGNOSIS — E213 Hyperparathyroidism, unspecified: Secondary | ICD-10-CM | POA: Diagnosis not present

## 2020-05-30 DIAGNOSIS — I5032 Chronic diastolic (congestive) heart failure: Secondary | ICD-10-CM

## 2020-05-30 DIAGNOSIS — I700 Atherosclerosis of aorta: Secondary | ICD-10-CM

## 2020-05-30 DIAGNOSIS — Z7901 Long term (current) use of anticoagulants: Secondary | ICD-10-CM | POA: Diagnosis not present

## 2020-05-30 DIAGNOSIS — I4892 Unspecified atrial flutter: Secondary | ICD-10-CM

## 2020-05-30 DIAGNOSIS — Z950 Presence of cardiac pacemaker: Secondary | ICD-10-CM | POA: Diagnosis not present

## 2020-05-30 DIAGNOSIS — I739 Peripheral vascular disease, unspecified: Secondary | ICD-10-CM | POA: Diagnosis not present

## 2020-05-30 NOTE — Assessment & Plan Note (Addendum)
Recent cervical spine MRI reviewed with patient.  Developing left arm radiculopathy.  Needs follow-up with neurosurgeon to discuss further therapeutic options.

## 2020-05-30 NOTE — Patient Instructions (Signed)

## 2020-05-30 NOTE — Progress Notes (Signed)
[name] 80 y.o.   Chief Complaint  Patient presents with  . Numbness    Per patient left hand started in the hospital with Covid 19, and it happened again yesterday. Sometimes feels like passing out and dropping stuff.    HISTORY OF PRESENT ILLNESS: This is a 80 y.o. female complaining of left arm weakness with occasional numbness for several months. Feels this started after getting COVID-19 infection last September. Cervical spine imaging studies from 2021 reviewed with patient.  They show significant cervical spine disease and stenosis. LOWER EXTREMITY ARTERIAL DUPLEX STUDY   Indications: Peripheral artery disease. Bilateral SFA stents placed in 2012,        with atherectomy of right SFA occluded stent in 09/2018. Patient        reports her legs are feeling fine at this point, no significant        claudication symptoms.   High Risk Factors: Hypertension, hyperlipidemia, past history of smoking,           coronary artery disease.    Vascular Interventions: Hx bilateral SFA stents placed in 2012. Right SFA             atherectomy 02/15/17. Restenting of right SFA CTO 10/06/18.  Summary:  Right: Patent right SFA stent, s/p atherectomy without evidence of restenosis.   Left: Essentially stable 50-74% stenosis noted in the superficial femoral artery. Patent left SFA stent, with <50% restenosis in the distal segment.     See table(s) above for measurements and observations.  See ABI report.   Suggest follow up study in 12 months.   ASSESSMENT:    1. Primary hypertension   2. Coronary artery disease involving coronary bypass graft of native heart without angina pectoris   3. Pacemaker   4. Atrial flutter, unspecified type ([place])   5. PAD (peripheral artery disease) ([place])   6. Renal artery stenosis (HCC)    PLAN:    In order of problems listed above:  1. Hypertension: Blood pressure continue to be quite elevated  after addition of Diovan.  I reviewed the previous CTA of chest abdomen with Dr. Sallyanne Kuster, patient's primary cardiologist.  Patient appears to have severe right renal artery stenosis which may help explain the resistant hypertension.  Most recent CT of abdomen obtained at [facility] also shows the size of the right kidney is decreased compared to the left side.  I recommended addition of carvedilol 6.25 mg twice a day  2. Coronary artery disease s/p CABG: Denies any recent chest pain  3. Symptomatic bradycardia s/p Medtronic dual-chamber pacemaker: Followed by Dr. Sallyanne Kuster.  Primarily atrial paced  4. History of PAF and atrial flutter: Very low A. fib burden  5. PAD: Previously seen by Dr. Alvester Chou for lower extremity disease  6. Renal artery stenosis: Recent CT imaging obtained at [facility] showed possible right renal artery stenosis and normal left renal artery, right kidney appears to be smaller than the left.  Looking back further, patient had another CTA in November 2021 that showed severe right renal artery stenosis.  This may help explain her resistant hypertension.  Will refer to Dr. Gwenlyn Found to see if renal artery stenting is reasonable.  Case has been discussed with Dr. Sallyanne Kuster, the patient's primary cardiologist.    HPI   Prior to Admission medications   Medication Sig Start Date End Date Taking? Authorizing Provider  alendronate (FOSAMAX) 70 MG tablet Take 70 mg by mouth every Saturday. 11/27/19   [provider]  amLODipine (NORVASC) 5 MG tablet Take 1 tablet (5 mg total) by mouth in the morning and at bedtime. 05/09/20   Croitoru, Mihai, MD  aspirin EC 81 MG tablet Take 81 mg by mouth daily.     [provider]  Biotin 10000 MCG TABS Take 10,000 mcg by mouth daily.    [provider]  carvedilol (COREG) 6.25 MG tablet Take 1 tablet (6.25 mg total) by mouth 2 (two) times daily with a meal. 05/08/20   Almyra Deforest, PA  Cholecalciferol (VITAMIN D3  PO) Take 1 tablet by mouth daily.    [provider]  citalopram (CELEXA) 40 MG tablet Take 1 tablet (40 mg total) by mouth daily. 05/07/20   Horald Pollen, MD  clopidogrel (PLAVIX) 75 MG tablet TAKE 1 TABLET (75 MG TOTAL) BY MOUTH DAILY WITH BREAKFAST. 02/05/20   Lorretta Harp, MD  dofetilide (TIKOSYN) 125 MCG capsule Take 1 capsule (125 mcg total) by mouth every 12 (twelve) hours. 03/20/20   Croitoru, Mihai, MD  furosemide (LASIX) 40 MG tablet Take 40 mg by mouth daily as needed for fluid or edema.    [provider]  levothyroxine (SYNTHROID, LEVOTHROID) 25 MCG tablet Take 25 mcg by mouth daily before breakfast.  04/03/16   [provider]  Magnesium 200 MG TABS Take 200 mg by mouth daily. Patient takes 1 tablet daily    [provider]  Multiple Vitamins-Minerals (MULTI-VITE) LIQD Take 1 tablet by mouth daily.     [provider]  niacin 250 MG tablet Take 250 mg by mouth daily.    [provider]  nitroGLYCERIN (NITROSTAT) 0.4 MG SL tablet Place 1 tablet (0.4 mg total) under the tongue every 5 (five) minutes x 3 doses as needed for chest pain. 05/15/15   Lyda Jester M, PA-C  Olive Leaf Extract 250 MG CAPS Take 250 mg by mouth 2 (two) times daily.    [provider]  rosuvastatin (CRESTOR) 10 MG tablet Take 1 tablet (10 mg total) by mouth daily. 11/22/19   Horald Pollen, MD  valsartan (DIOVAN) 40 MG tablet Take 1 tablet (40 mg total) by mouth daily. 04/16/20   Erlene Quan, PA-C  vitamin C (ASCORBIC ACID) 500 MG tablet Take 500 mg by mouth daily.    [provider]    Allergies  Allergen Reactions  . Brilinta [Ticagrelor] Shortness Of Breath  . Atorvastatin Other (See Comments)    Possible cause of fatigue/malaise  . Clonidine Derivatives Other (See Comments)    Lowers heart rate  . Crestor [Rosuvastatin Calcium] Other (See Comments)    Joint/muscle aches  . Exforge [Amlodipine  Besylate-Valsartan] Itching and Rash  . Spironolactone Other (See Comments)    Contraindicated with history of hyperkalemia    Patient Active Problem List   Diagnosis Date Noted  . Statin intolerance 11/22/2019  . Aortic atherosclerosis ([place]) 11/22/2019  . Acute respiratory failure due to COVID-19 ([place]) 10/10/2019  . Hyponatremia 10/10/2019  . Hypothyroidism (acquired) 02/08/2018  . Coronary artery disease involving coronary bypass graft of native heart without angina pectoris 10/22/2017  . Dyslipidemia 10/22/2017  . Cerebrovascular small vessel disease 04/27/2017  . Gait abnormality 03/25/2017  . Cerebrovascular disease 03/25/2017  . Claudication in peripheral vascular disease ([place]) 02/15/2017  . Hyperparathyroidism ([place]) 05/13/2016  . Cardiac pacemaker in situ 01/10/2016  . Benign neoplasm of ascending colon   . Long term current use of anticoagulant   . CKD (chronic kidney disease), stage  II   . H/O amiodarone therapy 12/04/2015  . Chronic diastolic heart failure ([place]) 10/19/2015  . Atypical atrial flutter ([place])   . Paroxysmal atrial flutter ([place]) 08/23/2015  . Chronic fatigue 06/27/2015  . Stented coronary artery   . S/P CABG x 3 01/04/15 01/10/2015  . CAD (coronary artery disease) 01/04/2015  . PAF (paroxysmal atrial fibrillation) ([place]) 01/22/2012  . Sinus node dysfunction ([place]) 01/22/2012  . S/P placement of cardiac pacemaker, medtronic adapta 01/21/12 01/22/2012  . PVD (peripheral vascular disease), hx stents to bil SFAs 02/2010 01/22/2012  . Hyperlipidemia 12/31/2009  . Anxiety 12/31/2009  . DEPRESSION 12/31/2009  . Essential hypertension 12/31/2009  . Coronary atherosclerosis 12/31/2009  . OSTEOARTHRITIS, HAND 12/31/2009  . [place] DISEASE, CERVICAL 12/31/2009  . [place] DISEASE, LUMBAR 12/31/2009  . UNSPECIFIED URINARY INCONTINENCE 12/31/2009    Past Medical History:  Diagnosis Date  . ANXIETY 12/31/2009  . Aortic insufficiency    a. mod by echo 2017.  [place] Kitchen CAD  (coronary artery disease)    a. s/p CABGx3 and LAA clipping in 12/2014, NSTEMI 05/2015 s/p DES to native RCA and SVG-dRCA; occluded ramus-SVG was treated medically). c. neg nuc 10/2015 at [facility].  . Chronic diastolic CHF (congestive heart failure) ([place])   . Chronic fatigue   . CKD (chronic kidney disease), stage II   . DEPRESSION 12/31/2009  . [place] DISEASE, CERVICAL 12/31/2009  . [place] DISEASE, LUMBAR 12/31/2009  . DIVERTICULITIS, HX OF 12/31/2009  . Essential hypertension   . Gait abnormality 03/25/2017  . GLUCOSE INTOLERANCE 12/31/2009  . Habitual alcohol use   . History of stroke    self reports " they say i have had some pin strokes"   . Hypercalcemia   . Hyperkalemia   . Hyperlipidemia 12/31/2009  . Hypothyroidism   . MENOPAUSE, EARLY 12/31/2009  . NSTEMI (non-ST elevated myocardial infarction) ([place]) 05/11/2015  . Orthostatic hypotension   . OSTEOARTHRITIS, HAND   . Pacemaker 01/21/2012   MDT Adapta dual chamber  . Paroxysmal atrial flutter ([place])   . Persistent atrial fibrillation ([place])   . PVD (peripheral vascular disease), hx stents to bil SFAs 02/2010   . Rash, skin     superficial raised red pencil point sized rash bilateral forearms; states " it started when i started the Plavix "   . Ruptured left breast implant   . Symptomatic sinus bradycardia 01/22/2012  . Syncope    a. 11/2015 ? due to medications.  . Syncope    reports on 05-10-17 " i passed out 2 months ago in the bathroom and broke some ribs"    . Tachy-brady syndrome ([place])    a. s/p MDT PPM 2013.  . Tricuspid regurgitation     Past Surgical History:  Procedure Laterality Date  . ABDOMINAL AORTOGRAM W/LOWER EXTREMITY Bilateral 02/15/2017   Procedure: ABDOMINAL AORTOGRAM W/LOWER EXTREMITY;  Surgeon: Lorretta Harp, MD;  Location: [place] [facility];  Service: Cardiovascular;  Laterality: Bilateral;  bilat  . ABDOMINAL AORTOGRAM W/LOWER EXTREMITY N/A 10/06/2018   Procedure: ABDOMINAL AORTOGRAM W/LOWER EXTREMITY;   Surgeon: Lorretta Harp, MD;  Location: [place] [facility];  Service: Cardiovascular;  Laterality: N/A;  . Ablation of typical atrial flutter (CTI line)  09/05/2016   [facility] Antonieta Pert at [facility]   . [place]   Removed 01/2018  . BREAST IMPLANT REMOVAL Bilateral 01/14/2018   Procedure: REMOVAL BILATERAL BREAST IMPLANTS;  Surgeon: Irene Limbo, MD;  Location: [place];  Service: Plastics;  Laterality: Bilateral;  . CAPSULECTOMY Bilateral 01/14/2018   Procedure: CAPSULECTOMY;  Surgeon: Irene Limbo, MD;  Location: [place];  Service: Plastics;  Laterality: Bilateral;  . CARDIAC CATHETERIZATION N/A 01/01/2015   Procedure: Left Heart Cath and Coronary Angiography;  Surgeon: Jettie Booze, MD;  Location: [place] [facility];  Service: Cardiovascular;  Laterality: N/A;  . CARDIAC CATHETERIZATION N/A 05/12/2015   Procedure: Left Heart Cath and Coronary Angiography;  Surgeon: Troy Sine, MD;  Location: [place] [facility];  Service: Cardiovascular;  Laterality: N/A;  . CARDIAC CATHETERIZATION N/A 05/12/2015   Procedure: Coronary Stent Intervention;  Surgeon: Troy Sine, MD;  Location: [place] [facility];  Service: Cardiovascular;  Laterality: N/A;  . CARDIOVERSION N/A 02/01/2015   Procedure: CARDIOVERSION;  Surgeon: Lelon Perla, MD;  Location: [facility] ENDOSCOPY;  Service: Cardiovascular;  Laterality: N/A;  . CARDIOVERSION N/A 08/30/2015   Procedure: CARDIOVERSION;  Surgeon: Sanda Klein, MD;  Location: [place] ENDOSCOPY;  Service: Cardiovascular;  Laterality: N/A;  . CARPAL TUNNEL RELEASE  2008   "right hand/thumb; carpal tunnel repair; got rid of arthritis" (01/21/2012)  . CLIPPING OF ATRIAL APPENDAGE N/A 01/04/2015   Procedure: CLIPPING OF ATRIAL APPENDAGE;  Surgeon: Grace Isaac, MD;  Location: [place];  Service: Open Heart Surgery;  Laterality: N/A;  . COLONOSCOPY N/A 12/21/2015   Procedure: COLONOSCOPY;  Surgeon: Milus Banister, MD;  Location: [facility] ENDOSCOPY;  Service: Endoscopy;  Laterality: N/A;  . CORONARY ARTERY BYPASS GRAFT N/A 01/04/2015   Procedure: CORONARY ARTERY BYPASS GRAFTING (CABG) x 3 using left internal mammory artery and greater saphenous vein right leg harvested endoscopically.;  Surgeon: Grace Isaac, MD;  LIMA-LAD, SVG-RI, SVG-PDA  . ESOPHAGOGASTRODUODENOSCOPY (EGD) WITH PROPOFOL N/A 12/18/2015   Procedure: ESOPHAGOGASTRODUODENOSCOPY (EGD) WITH PROPOFOL;  Surgeon: Milus Banister, MD;  Location: [facility] ENDOSCOPY;  Service: Endoscopy;  Laterality: N/A;  . FACELIFT, LOWER 2/3  1995   "mini" (01/21/2012)  . GIVENS CAPSULE STUDY N/A 12/21/2015   Procedure: GIVENS CAPSULE STUDY;  Surgeon: Milus Banister, MD;  Location: [facility] ENDOSCOPY;  Service: Endoscopy;  Laterality: N/A;  . Lower Arterial Examination  10/28/2011   R. SFA stent mild-moderate mixed density plaque with elevated velocities consistent with 50% diameter reduction. L. SFA stent moderate mixed denisty plaque at mid to distal level consistent with 50-69% diameter reduction.  . LOWER EXTREMITY ANGIOGRAPHY N/A 02/15/2017   Procedure: LOWER EXTREMITY ANGIOGRAPHY;  Surgeon: Lorretta Harp, MD;  Location: [place] [facility];  Service: Cardiovascular;  Laterality: N/A;  . OOPHORECTOMY  ~1979  . PARATHYROIDECTOMY N/A 05/13/2017   Procedure: PARATHYROIDECTOMY;  Surgeon: Armandina Gemma, MD;  Location: [facility];  Service: General;  Laterality: N/A;  . PARTIAL COLECTOMY  2010  . PERIPHERAL ARTERIAL STENT GRAFT  2012; 2012   "LLE; RLE" (01/21/2012)  . PERIPHERAL VASCULAR BALLOON ANGIOPLASTY Right 02/15/2017   Procedure: PERIPHERAL VASCULAR BALLOON ANGIOPLASTY;  Surgeon: Lorretta Harp, MD;  Location: [place] [facility];  Service: Cardiovascular;  Laterality: Right;  SFA    . PERIPHERAL VASCULAR INTERVENTION Right 10/06/2018   Procedure: PERIPHERAL VASCULAR INTERVENTION;  Surgeon: Lorretta Harp, MD;  Location: [place] [facility];  Service:  Cardiovascular;  Laterality: Right;  . PERMANENT PACEMAKER INSERTION N/A 01/21/2012   Medtronic Adapta L implanted by Dr Sallyanne Kuster for tachy/brady syndrome  . POSTERIOR CERVICAL LAMINECTOMY  1985  . TEE WITHOUT CARDIOVERSION N/A 01/04/2015   Procedure: TRANSESOPHAGEAL ECHOCARDIOGRAM (TEE);  Surgeon: Grace Isaac, MD;  Location:  [place] [facility];  Service: Open Heart Surgery;  Laterality: N/A;  . TEE WITHOUT CARDIOVERSION N/A 02/01/2015   Procedure: TRANSESOPHAGEAL ECHOCARDIOGRAM (TEE);  Surgeon: Lelon Perla, MD;  Location: [facility] ENDOSCOPY;  Service: Cardiovascular;  Laterality: N/A;  . TEE WITHOUT CARDIOVERSION N/A 08/30/2015   Procedure: TRANSESOPHAGEAL ECHOCARDIOGRAM (TEE);  Surgeon: Sanda Klein, MD;  Location: [facility] ENDOSCOPY;  Service: Cardiovascular;  Laterality: N/A;  . [place] History   Socioeconomic History  . Marital status: Widowed    Spouse name: Not on file  . Number of children: 3  . Years of education: Not on file  . Highest education level: Not on file  Occupational History  . 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 Smoking status: Former Smoker    Packs/day: 0.75    Years: 10.00    Pack years: 7.50    Types: Cigarettes    Quit date: 2008    Years since quitting: 14.3  . Smokeless tobacco: Never Used  . Tobacco comment: 01/21/2012 "quit smoking ~ 2002"  Vaping Use  . Vaping Use: Never used  Substance and Sexual Activity  . Alcohol use: Yes    Comment: daily coctail  . Drug use: No  . Sexual activity: Not Currently  Other Topics Concern  . Not on file  Social History Narrative   Lives in [place].  Retired.   Social Determinants of Health   Financial Resource Strain: Not on file  Food Insecurity: Not on file  Transportation Needs: Not on file  Physical Activity: Not on file  Stress: Not on file  Social Connections: Not on file  Intimate Partner Violence: Not on file    Family History   Problem Relation Age of Onset  . Heart disease Mother   . Hypertension Mother   . Stroke Mother   . Stroke Father   . Heart disease Father   . Heart disease Brother   . Hypertension Brother        2 brothers  . CVA Brother        2 brothers  . Heart attack Brother        2 brothers     Review of Systems  Constitutional: Negative.  Negative for chills and fever.  HENT: Negative.  Negative for congestion and sore throat.   Respiratory: Negative.  Negative for cough and shortness of breath.   Cardiovascular: Negative.  Negative for chest pain and palpitations.  Gastrointestinal: Negative for abdominal pain, diarrhea, nausea and vomiting.  Genitourinary: Negative.  Negative for dysuria and hematuria.  Skin: Negative.   Neurological: Positive for tingling and focal weakness. Negative for dizziness, speech change, seizures, loss of consciousness and headaches.       Left arm  All other systems reviewed and are negative.  Today's Vitals   05/30/20 1351  BP: 110/62  Pulse: 84  Temp: 99.1 F (37.3 C)  TempSrc: Oral  SpO2: 96%  Weight: 147 lb (66.7 kg)  Height: 5\' 4"  (1.626 m)   Body mass index is 25.23 kg/m.   Physical Exam Vitals reviewed.  Constitutional:      Appearance: Normal appearance.  HENT:     Head: Normocephalic.  Eyes:     Extraocular Movements: Extraocular movements intact.     Conjunctiva/sclera: Conjunctivae normal.     Pupils: Pupils are equal, round, and reactive to light.  Cardiovascular:     Rate and Rhythm: Normal rate and regular  rhythm.     Pulses: Normal pulses.     Heart sounds: Normal heart sounds.  Pulmonary:     Effort: Pulmonary effort is normal.     Breath sounds: Normal breath sounds.  Musculoskeletal:     Cervical back: No rigidity or tenderness.  Lymphadenopathy:     Cervical: No cervical adenopathy.  Skin:    General: Skin is warm and dry.  Neurological:     General: No focal deficit present.     Mental Status: She is alert  and oriented to person, place, and time.     Cranial Nerves: No cranial nerve deficit.     Sensory: No sensory deficit.     Motor: No weakness.     Coordination: Coordination normal.     Gait: Gait normal.     Deep Tendon Reflexes: Reflexes normal.  Psychiatric:        Mood and Affect: Mood normal.        Behavior: Behavior normal.   CT CERVICAL MYELOGRAM IMPRESSION: 1. Advanced cervical disc degeneration with mild spinal stenosis at C4-5 and C5-6. 2. Moderate right neural foraminal stenosis at C3-4. 3. Mild-to-moderate bilateral neural foraminal stenosis at C4-5. 4. Prior left foraminal decompression at C5-6 and C6-7.   Electronically Signed   By: Logan Bores M.D.   On: 12/27/2019 14:38   ASSESSMENT & PLAN: Cervical spine disease Recent cervical spine MRI reviewed with patient.  Developing left arm radiculopathy.  Needs follow-up with neurosurgeon to discuss further therapeutic options.   Kiyla was seen today for numbness.  Diagnoses and all orders for this visit:  Cervical radiculopathy  Cervical spine disease  Need for shingles vaccine -     Varicella-zoster vaccine IM  Aortic atherosclerosis (HCC)  Coronary artery disease involving coronary bypass graft of native heart without angina pectoris  Claudication in peripheral vascular disease ([place])  Hyperparathyroidism ([place])  Cardiac pacemaker in situ  Long term current use of anticoagulant  Chronic diastolic heart failure (HCC)  Paroxysmal atrial flutter (HCC)    Patient Instructions   Cervical Radiculopathy  Cervical radiculopathy means that a nerve in the neck (a cervical nerve) is pinched or bruised. This can happen because of an injury to the cervical spine (vertebrae) in the neck, or as a normal part of getting older. This can cause pain or loss of feeling (numbness) that runs from your neck all the way down to your arm and fingers. Often, this condition gets better with rest. Treatment may be  needed if the condition does not get better. What are the causes?  A neck injury.  A bulging disk in your spine.  Muscle movements that you cannot control (muscle spasms).  Tight muscles in your neck due to overuse.  Arthritis.  Breakdown in the bones and joints of the spine (spondylosis) due to getting older.  Bone spurs that form near the nerves in the neck. What are the signs or symptoms?  Pain. The pain may: ? Run from the neck to the arm and hand. ? Be very bad or irritating. ? Be worse when you move your neck.  Loss of feeling or tingling in your arm or hand.  Weakness in your arm or hand, in very bad cases. How is this treated? In many cases, treatment is not needed for this condition. With rest, the condition often gets better over time. If treatment is needed, options may include:  Wearing a soft neck collar (cervical collar) for short periods of time, as  told by your doctor.  Doing exercises (physical therapy) to strengthen your neck muscles.  Taking medicines.  Having shots (injections) in your spine, in very bad cases.  Having surgery. This may be needed if other treatments do not help. The type of surgery that is used depends on the cause of your condition. Follow these instructions at home: If you have a soft neck collar:  Wear it as told by your doctor. Remove it only as told by your doctor.  Ask your doctor if you can remove the collar for cleaning and bathing. If you are allowed to remove the collar for cleaning or bathing: ? Follow instructions from your doctor about how to remove the collar safely. ? Clean the collar by wiping it with mild soap and water and drying it completely. ? Take out any removable pads in the collar every 1-2 days. Wash them by hand with soap and water. Let them air-dry completely before you put them back in the collar. ? Check your skin under the collar for redness or sores. If you see any, tell your doctor. Managing  pain  Take over-the-counter and prescription medicines only as told by your doctor.  If told, put ice on the painful area. ? If you have a soft neck collar, remove it as told by your doctor. ? Put ice in a plastic bag. ? Place a towel between your skin and the bag. ? Leave the ice on for 20 minutes, 2-3 times a day.  If using ice does not help, you can try using heat. Use the heat source that your doctor recommends, such as a moist heat pack or a heating pad. ? Place a towel between your skin and the heat source. ? Leave the heat on for 20-30 minutes. ? Remove the heat if your skin turns bright red. This is very important if you are unable to feel pain, heat, or cold. You may have a greater risk of getting burned.  You may try a gentle neck and shoulder rub (massage).      Activity  Rest as needed.  Return to your normal activities as told by your doctor. Ask your doctor what activities are safe for you.  Do exercises as told by your doctor or physical therapist.  Do not lift anything that is heavier than 10 lb (4.5 kg) until your doctor tells you that it is safe. General instructions  Use a flat pillow when you sleep.  Do not drive while wearing a soft neck collar. If you do not have a soft neck collar, ask your doctor if it is safe to drive while your neck heals.  Ask your doctor if the medicine prescribed to you requires you to avoid driving or using heavy machinery.  Do not use any products that contain nicotine or tobacco, such as cigarettes, e-cigarettes, and chewing tobacco. These can delay healing. If you need help quitting, ask your doctor.  Keep all follow-up visits as told by your doctor. This is important. Contact a doctor if:  Your condition does not get better with treatment. Get help right away if:  Your pain gets worse and is not helped with medicine.  You lose feeling or feel weak in your hand, arm, face, or leg.  You have a high fever.  You have a  stiff neck.  You cannot control when you poop or pee (have incontinence).  You have trouble with walking, balance, or talking. Summary  Cervical radiculopathy means that a nerve  in the neck is pinched or bruised.  A nerve can get pinched from a bulging disk, arthritis, an injury to the neck, or other causes.  Symptoms include pain, tingling, or loss of feeling that goes from the neck into the arm or hand.  Weakness in your arm or hand can happen in very bad cases.  Treatment may include resting, wearing a soft neck collar, and doing exercises. You might need to take medicines for pain. In very bad cases, shots or surgery may be needed. This information is not intended to replace advice given to you by your health care provider. Make sure you discuss any questions you have with your health care provider. Document Revised: 12/17/2017 Document Reviewed: 12/17/2017 Elsevier Patient Education  2021 [place], MD [facility]

## 2020-06-03 ENCOUNTER — Other Ambulatory Visit: Payer: Self-pay | Admitting: Cardiovascular Disease

## 2020-06-05 DIAGNOSIS — M47812 Spondylosis without myelopathy or radiculopathy, cervical region: Secondary | ICD-10-CM | POA: Diagnosis not present

## 2020-06-05 DIAGNOSIS — I10 Essential (primary) hypertension: Secondary | ICD-10-CM | POA: Diagnosis not present

## 2020-06-05 DIAGNOSIS — Z6826 Body mass index (BMI) 26.0-26.9, adult: Secondary | ICD-10-CM | POA: Diagnosis not present

## 2020-06-11 ENCOUNTER — Encounter: Payer: Self-pay | Admitting: Cardiovascular Disease

## 2020-06-11 ENCOUNTER — Other Ambulatory Visit: Payer: Self-pay

## 2020-06-11 ENCOUNTER — Ambulatory Visit (INDEPENDENT_AMBULATORY_CARE_PROVIDER_SITE_OTHER): Payer: Medicare HMO | Admitting: Cardiovascular Disease

## 2020-06-11 VITALS — BP 134/70 | HR 84 | Ht 64.0 in | Wt 142.6 lb

## 2020-06-11 DIAGNOSIS — I480 Paroxysmal atrial fibrillation: Secondary | ICD-10-CM

## 2020-06-11 DIAGNOSIS — I495 Sick sinus syndrome: Secondary | ICD-10-CM

## 2020-06-11 DIAGNOSIS — I701 Atherosclerosis of renal artery: Secondary | ICD-10-CM

## 2020-06-11 DIAGNOSIS — I739 Peripheral vascular disease, unspecified: Secondary | ICD-10-CM | POA: Diagnosis not present

## 2020-06-11 DIAGNOSIS — Z79899 Other long term (current) drug therapy: Secondary | ICD-10-CM

## 2020-06-11 DIAGNOSIS — I679 Cerebrovascular disease, unspecified: Secondary | ICD-10-CM | POA: Diagnosis not present

## 2020-06-11 DIAGNOSIS — I25810 Atherosclerosis of coronary artery bypass graft(s) without angina pectoris: Secondary | ICD-10-CM | POA: Diagnosis not present

## 2020-06-11 DIAGNOSIS — E7800 Pure hypercholesterolemia, unspecified: Secondary | ICD-10-CM

## 2020-06-11 DIAGNOSIS — I10 Essential (primary) hypertension: Secondary | ICD-10-CM

## 2020-06-11 DIAGNOSIS — Z950 Presence of cardiac pacemaker: Secondary | ICD-10-CM

## 2020-06-11 DIAGNOSIS — I5032 Chronic diastolic (congestive) heart failure: Secondary | ICD-10-CM | POA: Diagnosis not present

## 2020-06-11 DIAGNOSIS — Z5181 Encounter for therapeutic drug level monitoring: Secondary | ICD-10-CM | POA: Diagnosis not present

## 2020-06-11 DIAGNOSIS — E213 Hyperparathyroidism, unspecified: Secondary | ICD-10-CM

## 2020-06-11 LAB — LIPID PANEL
Chol/HDL Ratio: 2.3 ratio (ref 0.0–4.4)
Cholesterol, Total: 202 mg/dL — ABNORMAL HIGH (ref 100–199)
HDL: 89 mg/dL (ref >39)
LDL Chol Calc (NIH): 84 mg/dL (ref 0–99)
Triglycerides: 177 mg/dL — ABNORMAL HIGH (ref 0–149)
VLDL Cholesterol Cal: 29 mg/dL (ref 5–40)

## 2020-06-11 LAB — COMPREHENSIVE METABOLIC PANEL
ALT: 13 IU/L (ref 0–32)
AST: 18 IU/L (ref 0–40)
Albumin/Globulin Ratio: 2 (ref 1.2–2.2)
Albumin: 4.9 g/dL — ABNORMAL HIGH (ref 3.7–4.7)
Alkaline Phosphatase: 116 IU/L (ref 44–121)
BUN/Creatinine Ratio: 16 (ref 12–28)
BUN: 25 mg/dL (ref 8–27)
Bilirubin Total: 0.4 mg/dL (ref 0.0–1.2)
CO2: 22 mmol/L (ref 20–29)
Calcium: 9.8 mg/dL (ref 8.7–10.3)
Chloride: 94 mmol/L — ABNORMAL LOW (ref 96–106)
Creatinine, Ser: 1.61 mg/dL — ABNORMAL HIGH (ref 0.57–1.00)
Globulin, Total: 2.5 g/dL (ref 1.5–4.5)
Glucose: 100 mg/dL — ABNORMAL HIGH (ref 65–99)
Potassium: 4.9 mmol/L (ref 3.5–5.2)
Sodium: 135 mmol/L (ref 134–144)
Total Protein: 7.4 g/dL (ref 6.0–8.5)
eGFR: 32 mL/min/{1.73_m2} — ABNORMAL LOW (ref >59)

## 2020-06-11 NOTE — Patient Instructions (Addendum)
Medication Instructions:  STOP the Valsartan if you are taking it. TAKE the Losartan 25 mg twice daily  *If you need a refill on your cardiac medications before your next appointment, please call your pharmacy*   Lab Work: Your provider would like for you to have the following labs today: lipid and cmet  If you have labs (blood work) drawn today and your tests are completely normal, you will receive your results only by: Marland Kitchen MyChart Message (if you have MyChart) OR . A paper copy in the mail If you have any lab test that is abnormal or we need to change your treatment, we will call you to review the results.   Testing/Procedures: None ordered   Follow-Up: At [facility], you and your health needs are our priority.  As part of our continuing mission to provide you with exceptional heart care, we have created designated Provider Care Teams.  These Care Teams include your primary Cardiologist (physician) and Advanced Practice Providers (APPs -  Physician Assistants and Nurse Practitioners) who all work together to provide you with the care you need, when you need it.  We recommend signing up for the patient portal called "MyChart".  Sign up information is provided on this After Visit Summary.  MyChart is used to connect with patients for Virtual Visits (Telemedicine).  Patients are able to view lab/test results, encounter notes, upcoming appointments, etc.  Non-urgent messages can be sent to your provider as well.   To learn more about what you can do with MyChart, go to [email or web address].    Your next appointment:   6 month(s)  The format for your next appointment:   In Person  Provider:   You may see Sanda Klein, MD or one of the following Advanced Practice Providers on your designated Care Team:

## 2020-06-11 NOTE — Progress Notes (Signed)
Patient ID: [name], female   DOB: Aug 18, 1940, 80 y.o.   MRN: [id number]     Cardiology Office Note    Date:  06/11/2020   ID:  [name] 1941-02-01, MRN [id number]  PCP:  Horald Pollen, MD  Cardiologist:   Sanda Klein, MD   No chief complaint on file.   History of Present Illness:  [name] is a 80 y.o. female with coronary artery disease s/p bypass surgery (November 2016) and placement of drug-eluting stents (DES to distal SVG-RCA, April 2017 ) and left atrial appendage clipping, symptomatic paroxysmal atrial fibrillation and atrial flutter with radiofrequency ablation (cavotricuspid isthmus and pulmonary vein isolation), PAD with previous stents, extensive intracranial atherosclerotic disease, history of GI bleeding while on anticoagulation, volatile hypertension.  She recovered well from COVID-19 infection in August 2021.  She was hospitalized with abdominal pain at [facility] on April 22, 2020.  Her work-up included an abdominal CT that showed a relatively smaller size right kidney and suspicion for high-grade right renal artery stenosis.  Her creatinine was 1.1 at that time.  Incidental note was also made of a 1.6 cm right adrenal mass and prominent atherosclerosis in the infrarenal abdominal aorta. There was evidence of colonic distention and constipation which was felt to be the cause of her symptoms.  She denies angina or dyspnea at rest or with exertion, syncope and palpitations, falls or bleeding.  Since then her blood pressure has been well controlled.  When she last saw Kerin Ransom he started treatment with valsartan since her blood pressure was high, and now her list contains both valsartan and losartan 25 mg twice  ECG early March showed excessive QTc prolongation at 523 ms and we cut back her dofetilide to 125 mcg Q12h. Today QTc is 490 ms.  AFib burden remains low (<0.1%). No recent NSVT. There is 98.7% A  pacing (good heart rate histograms) and <0.5% V pacing. Generator longevity estimated at 4.5 years (Medtronic Adapta dual-chamber device implanted in 2013).   She has a history of hyperkalemia with ACE inhibitors in the past.  Amiodarone caused hypothyroidism and alopecia. She has tried taking statins but has repeatedly stopped them due to musculoskeletal problems.  She seems to tolerate the current 10 mg dose of rosuvastatin.  Have not rechecked her LDL cholesterol since last November 2020 when it was 140 without any therapy.  In late August 2020 she underwent an abdominal aortic angiogram with runoff, followed by angioplasty and drug coated stenting of a chronic total occlusion of the right superficial femoral artery. Preprocedure ABI was 0.82  Post procedure ABI was 1.1 at follow-up on October 18 2018, 1.0 in March 2021 (bilaterally).  By ultrasound performed in July 2021, known occluded mid right SFA stent with reconstitution above the level of the popliteal artery and three-vessel runoff, 80% stenosis in the proximal left SFA with widely patent mid left SFA stent.  She has normal left ventricular systolic function. She does have evidence of grade 2 diastolic dysfunction on previous echo. At the time of her bypass surgery she received an Atricure left atrial appendage clip. She is also on clopidogrel following her drug-eluting stents in April 2017. The pacemaker was implanted in 2013 for symptomatic sinus bradycardia. She also has a history of peripheral arterial disease and received bilateral superficial femoral artery stents in 2016, repeat revascularization with drug-coated stent to a totally occluded right superficial femoral artery in 2020. She has never smoked  but drinks daily. She has treated hypertension.  After undergoing bypass surgery in November 2016 she returned with non-ST segment elevation myocardial infarction in April 2017 and required placement of stents both in the native right  coronary artery (3.020 mm Synergy DES) and in the saphenous vein graft to the distal right coronary artery (2.7538 mm Synergy DES) due to early graft dysfunction. The saphenous vein graft to the ramus intermedius was also occluded, but this vessel was left for medical therapy.    Past Medical History:  Diagnosis Date  . ANXIETY 12/31/2009  . Aortic insufficiency    a. mod by echo 2017.  Marland Kitchen CAD (coronary artery disease)    a. s/p CABGx3 and LAA clipping in 12/2014, NSTEMI 05/2015 s/p DES to native RCA and SVG-dRCA; occluded ramus-SVG was treated medically). c. neg nuc 10/2015 at [facility].  . Chronic diastolic CHF (congestive heart failure) ([place])   . Chronic fatigue   . CKD (chronic kidney disease), stage II   . DEPRESSION 12/31/2009  . [place] DISEASE, CERVICAL 12/31/2009  . [place] DISEASE, LUMBAR 12/31/2009  . DIVERTICULITIS, HX OF 12/31/2009  . Essential hypertension   . Gait abnormality 03/25/2017  . GLUCOSE INTOLERANCE 12/31/2009  . Habitual alcohol use   . History of stroke    self reports " they say i have had some pin strokes"   . Hypercalcemia   . Hyperkalemia   . Hyperlipidemia 12/31/2009  . Hypothyroidism   . MENOPAUSE, EARLY 12/31/2009  . NSTEMI (non-ST elevated myocardial infarction) ([place]) 05/11/2015  . Orthostatic hypotension   . OSTEOARTHRITIS, HAND   . Pacemaker 01/21/2012   MDT Adapta dual chamber  . Paroxysmal atrial flutter ([place])   . Persistent atrial fibrillation ([place])   . PVD (peripheral vascular disease), hx stents to bil SFAs 02/2010   . Rash, skin     superficial raised red pencil point sized rash bilateral forearms; states " it started when i started the Plavix "   . Ruptured left breast implant   . Symptomatic sinus bradycardia 01/22/2012  . Syncope    a. 11/2015 ? due to medications.  . Syncope    reports on 05-10-17 " i passed out 2 months ago in the bathroom and broke some ribs"    . Tachy-brady syndrome ([place])    a. s/p MDT PPM 2013.  . Tricuspid regurgitation      Past Surgical History:  Procedure Laterality Date  . ABDOMINAL AORTOGRAM W/LOWER EXTREMITY Bilateral 02/15/2017   Procedure: ABDOMINAL AORTOGRAM W/LOWER EXTREMITY;  Surgeon: Lorretta Harp, MD;  Location: [place] CV [facility];  Service: Cardiovascular;  Laterality: Bilateral;  bilat  . ABDOMINAL AORTOGRAM W/LOWER EXTREMITY N/A 10/06/2018   Procedure: ABDOMINAL AORTOGRAM W/LOWER EXTREMITY;  Surgeon: Lorretta Harp, MD;  Location: [place] [facility];  Service: Cardiovascular;  Laterality: N/A;  . Ablation of typical atrial flutter (CTI line)  09/05/2016   Dr Antonieta Pert at [facility]   . [place]   Removed 01/2018  . BREAST IMPLANT REMOVAL Bilateral 01/14/2018   Procedure: REMOVAL BILATERAL BREAST IMPLANTS;  Surgeon: Irene Limbo, MD;  Location: [place];  Service: Plastics;  Laterality: Bilateral;  . CAPSULECTOMY Bilateral 01/14/2018   Procedure: CAPSULECTOMY;  Surgeon: Irene Limbo, MD;  Location: [place];  Service: Plastics;  Laterality: Bilateral;  . CARDIAC CATHETERIZATION N/A 01/01/2015   Procedure: Left Heart Cath and Coronary Angiography;  Surgeon: Jettie Booze, MD;  Location: [place] [facility];  Service: Cardiovascular;  Laterality: N/A;  . CARDIAC CATHETERIZATION N/A 05/12/2015   Procedure: Left Heart Cath and Coronary Angiography;  Surgeon: Troy Sine, MD;  Location: [place] [facility];  Service: Cardiovascular;  Laterality: N/A;  . CARDIAC CATHETERIZATION N/A 05/12/2015   Procedure: Coronary Stent Intervention;  Surgeon: Troy Sine, MD;  Location: [place] [facility];  Service: Cardiovascular;  Laterality: N/A;  . CARDIOVERSION N/A 02/01/2015   Procedure: CARDIOVERSION;  Surgeon: Lelon Perla, MD;  Location: [facility] ENDOSCOPY;  Service: Cardiovascular;  Laterality: N/A;  . CARDIOVERSION N/A 08/30/2015   Procedure: CARDIOVERSION;  Surgeon: Sanda Klein, MD;  Location: [place] ENDOSCOPY;  Service: Cardiovascular;   Laterality: N/A;  . CARPAL TUNNEL RELEASE  2008   "right hand/thumb; carpal tunnel repair; got rid of arthritis" (01/21/2012)  . CLIPPING OF ATRIAL APPENDAGE N/A 01/04/2015   Procedure: CLIPPING OF ATRIAL APPENDAGE;  Surgeon: Grace Isaac, MD;  Location: [place];  Service: Open Heart Surgery;  Laterality: N/A;  . COLONOSCOPY N/A 12/21/2015   Procedure: COLONOSCOPY;  Surgeon: Milus Banister, MD;  Location: [facility] ENDOSCOPY;  Service: Endoscopy;  Laterality: N/A;  . CORONARY ARTERY BYPASS GRAFT N/A 01/04/2015   Procedure: CORONARY ARTERY BYPASS GRAFTING (CABG) x 3 using left internal mammory artery and greater saphenous vein right leg harvested endoscopically.;  Surgeon: Grace Isaac, MD;  LIMA-LAD, SVG-RI, SVG-PDA  . ESOPHAGOGASTRODUODENOSCOPY (EGD) WITH PROPOFOL N/A 12/18/2015   Procedure: ESOPHAGOGASTRODUODENOSCOPY (EGD) WITH PROPOFOL;  Surgeon: Milus Banister, MD;  Location: [facility] ENDOSCOPY;  Service: Endoscopy;  Laterality: N/A;  . FACELIFT, LOWER 2/3  1995   "mini" (01/21/2012)  . GIVENS CAPSULE STUDY N/A 12/21/2015   Procedure: GIVENS CAPSULE STUDY;  Surgeon: Milus Banister, MD;  Location: [facility] ENDOSCOPY;  Service: Endoscopy;  Laterality: N/A;  . Lower Arterial Examination  10/28/2011   R. SFA stent mild-moderate mixed density plaque with elevated velocities consistent with 50% diameter reduction. L. SFA stent moderate mixed denisty plaque at mid to distal level consistent with 50-69% diameter reduction.  . LOWER EXTREMITY ANGIOGRAPHY N/A 02/15/2017   Procedure: LOWER EXTREMITY ANGIOGRAPHY;  Surgeon: Lorretta Harp, MD;  Location: [place] [facility];  Service: Cardiovascular;  Laterality: N/A;  . OOPHORECTOMY  ~1979  . PARATHYROIDECTOMY N/A 05/13/2017   Procedure: PARATHYROIDECTOMY;  Surgeon: Armandina Gemma, MD;  Location: [facility] ORS;  Service: General;  Laterality: N/A;  . PARTIAL COLECTOMY  2010  . PERIPHERAL ARTERIAL STENT GRAFT  2012; 2012   "LLE; RLE" (01/21/2012)  . PERIPHERAL VASCULAR  BALLOON ANGIOPLASTY Right 02/15/2017   Procedure: PERIPHERAL VASCULAR BALLOON ANGIOPLASTY;  Surgeon: Lorretta Harp, MD;  Location: [place] [facility];  Service: Cardiovascular;  Laterality: Right;  SFA    . PERIPHERAL VASCULAR INTERVENTION Right 10/06/2018   Procedure: PERIPHERAL VASCULAR INTERVENTION;  Surgeon: Lorretta Harp, MD;  Location: [place] [facility];  Service: Cardiovascular;  Laterality: Right;  . PERMANENT PACEMAKER INSERTION N/A 01/21/2012   Medtronic Adapta L implanted by Dr Sallyanne Kuster for tachy/brady syndrome  . POSTERIOR CERVICAL LAMINECTOMY  1985  . TEE WITHOUT CARDIOVERSION N/A 01/04/2015   Procedure: TRANSESOPHAGEAL ECHOCARDIOGRAM (TEE);  Surgeon: Grace Isaac, MD;  Location: [place];  Service: Open Heart Surgery;  Laterality: N/A;  . TEE WITHOUT CARDIOVERSION N/A 02/01/2015   Procedure: TRANSESOPHAGEAL ECHOCARDIOGRAM (TEE);  Surgeon: Lelon Perla, MD;  Location: [facility] ENDOSCOPY;  Service: Cardiovascular;  Laterality: N/A;  . TEE WITHOUT CARDIOVERSION N/A 08/30/2015   Procedure: TRANSESOPHAGEAL ECHOCARDIOGRAM (TEE);  Surgeon: Sanda Klein, MD;  Location:  [facility] ENDOSCOPY;  Service: Cardiovascular;  Laterality: N/A;  . VAGINAL HYSTERECTOMY  1975    Current Medications: Outpatient Medications Prior to Visit  Medication Sig Dispense Refill  . alendronate (FOSAMAX) 70 MG tablet Take 70 mg by mouth every Saturday.    Marland Kitchen amLODipine (NORVASC) 5 MG tablet Take 1 tablet (5 mg total) by mouth in the morning and at bedtime. 180 tablet 2  . aspirin EC 81 MG tablet Take 81 mg by mouth daily.     . Biotin 10000 MCG TABS Take 10,000 mcg by mouth daily.    . carvedilol (COREG) 6.25 MG tablet Take 1 tablet (6.25 mg total) by mouth 2 (two) times daily with a meal. 60 tablet 5  . Cholecalciferol (VITAMIN D3 PO) Take 1 tablet by mouth daily.    . citalopram (CELEXA) 40 MG tablet Take 1 tablet (40 mg total) by mouth daily. 90 tablet 1  . clopidogrel (PLAVIX) 75 MG tablet TAKE 1 TABLET  (75 MG TOTAL) BY MOUTH DAILY WITH BREAKFAST. 90 tablet 3  . dofetilide (TIKOSYN) 125 MCG capsule TAKE 1 CAPSULE  BY MOUTH EVERY 12 HOURS. (GENERIC FOR TIKOSYN) 180 capsule 2  . furosemide (LASIX) 40 MG tablet Take 40 mg by mouth daily as needed for fluid or edema.    Marland Kitchen levothyroxine (SYNTHROID, LEVOTHROID) 25 MCG tablet Take 25 mcg by mouth daily before breakfast.     . losartan (COZAAR) 25 MG tablet Take 25 mg by mouth 2 (two) times daily.    . Magnesium 200 MG TABS Take 200 mg by mouth daily. Patient takes 1 tablet daily    . Multiple Vitamins-Minerals (MULTI-VITE) LIQD Take 1 tablet by mouth daily.     . nitroGLYCERIN (NITROSTAT) 0.4 MG SL tablet Place 1 tablet (0.4 mg total) under the tongue every 5 (five) minutes x 3 doses as needed for chest pain. 25 tablet 2  . Olive Leaf Extract 250 MG CAPS Take 250 mg by mouth 2 (two) times daily.    . rosuvastatin (CRESTOR) 10 MG tablet Take 1 tablet (10 mg total) by mouth daily. 90 tablet 3  . vitamin C (ASCORBIC ACID) 500 MG tablet Take 500 mg by mouth daily.    . niacin 250 MG tablet Take 250 mg by mouth daily.    . valsartan (DIOVAN) 40 MG tablet Take 1 tablet (40 mg total) by mouth daily. 90 tablet 3   No facility-administered medications prior to visit.     Allergies:   Brilinta [ticagrelor], Atorvastatin, Clonidine derivatives, Crestor [rosuvastatin calcium], Exforge [amlodipine besylate-valsartan], and Spironolactone   Social History   Socioeconomic History  . Marital status: Widowed    Spouse name: Not on file  . Number of children: 3  . Years of education: Not on file  . Highest education level: Not on file  Occupational History  . 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 Smoking status: Former Smoker    Packs/day: 0.75    Years: 10.00    Pack years: 7.50    Types: Cigarettes    Quit date: 2008    Years since quitting: 14.3  . Smokeless tobacco: Never Used  . Tobacco comment: 01/21/2012 "quit  smoking ~ 2002"  Vaping Use  . Vaping Use: Never used  Substance and Sexual Activity  . Alcohol use: Yes    Comment: daily coctail  . Drug use: No  . Sexual activity: Not Currently  Other Topics Concern  . Not  on file  Social History Narrative   Lives in [place].  Retired.   Social Determinants of Health   Financial Resource Strain: Not on file  Food Insecurity: Not on file  Transportation Needs: Not on file  Physical Activity: Not on file  Stress: Not on file  Social Connections: Not on file     Family History:  The patient's family history includes CVA in her brother; Heart attack in her brother; Heart disease in her brother, father, and mother; Hypertension in her brother and mother; Stroke in her father and mother.   ROS:   Please see the history of present illness.   All other systems are reviewed and are negative.   PHYSICAL EXAM:   VS:  BP 134/70   Pulse 84   Ht 5\' 4"  (1.626 m)   Wt 142 lb 9.6 oz (64.7 kg)   SpO2 96%   BMI 24.48 kg/m     Wt Readings from Last 3 Encounters:  06/11/20 142 lb 9.6 oz (64.7 kg)  05/30/20 147 lb (66.7 kg)  05/08/20 146 lb 6.4 oz (66.4 kg)     General: Alert, oriented x3, no distress, well healed pacemaker site Head: no evidence of trauma, PERRL, EOMI, no exophtalmos or lid lag, no myxedema, no xanthelasma; normal ears, nose and oropharynx Neck: normal jugular venous pulsations and no hepatojugular reflux; brisk carotid pulses without delay and no carotid bruits Chest: clear to auscultation, no signs of consolidation by percussion or palpation, normal fremitus, symmetrical and full respiratory excursions Cardiovascular: normal position and quality of the apical impulse, regular rhythm, normal first and second heart sounds, no murmurs, rubs or gallops Abdomen: no tenderness or distention, no masses by palpation, no abnormal pulsatility or arterial bruits, normal bowel sounds, no hepatosplenomegaly Extremities: no clubbing, cyanosis  or edema; 2+ radial, ulnar and brachial pulses bilaterally; 2+ right femoral, posterior tibial and dorsalis pedis pulses; 2+ left femoral, posterior tibial and dorsalis pedis pulses; no subclavian or femoral bruits Neurological: grossly nonfocal Psych: Normal mood and affect   Studies/Labs Reviewed:   EKG:  EKG is ordered today.  A paced V sensed , QS V1-V2, ST changes V4-V6. QTc 490 ms Recent Labs: 10/15/2019: Magnesium 2.1 12/20/2019: ALT 15; BUN 13; Creatinine, Ser 0.83; Hemoglobin 14.3; Platelets 301; Potassium 4.6; Sodium 136   Lipid Panel    Component Value Date/Time   CHOL 241 (H) 12/26/2018 1018   TRIG 83 10/10/2019 1921   HDL 71 12/26/2018 1018   CHOLHDL 3.4 12/26/2018 1018   CHOLHDL 2.5 05/12/2015 0324   VLDL 23 05/12/2015 0324   LDLCALC 140 (H) 12/26/2018 1018    ASSESSMENT:    1. PAF (paroxysmal atrial fibrillation) ([place])   2. Encounter for monitoring dofetilide therapy   3. SSS (sick sinus syndrome) ([place])   4. Pacemaker   5. Chronic diastolic heart failure ([place])   6. Coronary artery disease involving coronary bypass graft of native heart without angina pectoris   7. PAD (peripheral artery disease) ([place])   8. Cerebrovascular small vessel disease   9. Essential hypertension   10. Hyperparathyroidism ([place])   11. Hypercholesterolemia     PLAN:  In order of problems listed above:  1. AFib: Extremely low burden of arrhythmia since her ablation, even after we reduced the dose of dofetilide.  She has had clipping over left atrial appendage and is not on anticoagulation.  She does take aspirin and clopidogrel for PAD with stents in both lower extremities.   2. Dofetilide:  QTc is now in acceptable range after cutting the dofetilide dose to 125 mcg twice daily.   3. SSS: Virtually 100% atrial paced.  Heart rate histograms are good. 4. PPM:  Checked today, normal function. She has 5076 atrial and ventricular leads and a Medtronic Adapta generator.  Continue remote  downloads every 3 months. 5. CHF: NYHA functional class I and clinically euvolemic, off diuretics for months.  6. CAD: CCS class I on amlodipine.  Beta-blockers appear to worsen reactive airway disease and possibly caused hair loss.  Unfortunately, she had early failure of 2 saphenous vein grafts, one of which was treated with a drug-eluting stent. Her mammary artery bypass was widely patent at the most recent cardiac catheterization in April 2017. 7. PAD: Improved symptoms following her last revascularization procedure in August 2021.  Despite restenosis on one side, she has ABI 1.0 bilaterally and does not have intermittent claudication.  On dual antiplatelet therapy.  Also noted that she has lower blood pressure in the left arm suggesting left subclavian stenosis. Has a follow up with Dr. Gwenlyn Found in a few weeks. 8. Right renal artery stenosis: per CTA in [place] there is suspicion for R renal artery stenosis and a reduction in kidney size, but BP is controlled and renal function normal. Will ask Dr. Gwenlyn Found to review for revascularization indication. 9. ASCVD: She has mostly small vessel disease and the plan is for her to remain on clopidogrel indefinitely.  Avoid hypotension.  Allow SBP around 140. 10. HTN: Acceptable BP today.  Blood pressure in the desirable range today (note that she has lower blood pressure in the left arm and should always check blood pressure on the right). Avoid ACE inhibitors due to history of hyperkalemia while taking lisinopril. 11. 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  Normal calcium levels by most recent labs performed November 2021. 12. Hypercholesterolemia: Intolerant to multiple statins (myalgia, hair loss).  Seems to tolerate rosuvastatin low dose. We were trying to get her on Repatha.    Medication Adjustments/Labs and Tests Ordered: Current medicines are reviewed at length with the patient today.  Concerns regarding medicines  are outlined above.  Medication changes, Labs and Tests ordered today are listed in the Patient Instructions below. Patient Instructions  Medication Instructions:  STOP the Valsartan if you are taking it. TAKE the Losartan 25 mg twice daily  *If you need a refill on your cardiac medications before your next appointment, please call your pharmacy*   Lab Work: Your provider would like for you to have the following labs today: lipid and cmet  If you have labs (blood work) drawn today and your tests are completely normal, you will receive your results only by: Marland Kitchen MyChart Message (if you have MyChart) OR . A paper copy in the mail If you have any lab test that is abnormal or we need to change your treatment, we will call you to review the results.   Testing/Procedures: None ordered   Follow-Up: At [facility], you and your health needs are our priority.  As part of our continuing mission to provide you with exceptional heart care, we have created designated Provider Care Teams.  These Care Teams include your primary Cardiologist (physician) and Advanced Practice Providers (APPs -  Physician Assistants and Nurse Practitioners) who all work together to provide you with the care you need, when you need it.  We recommend signing up for the patient portal called "MyChart".  Sign up information is provided on  this After Visit Summary.  MyChart is used to connect with patients for Virtual Visits (Telemedicine).  Patients are able to view lab/test results, encounter notes, upcoming appointments, etc.  Non-urgent messages can be sent to your provider as well.   To learn more about what you can do with MyChart, go to [email or web address].    Your next appointment:   6 month(s)  The format for your next appointment:   In Person  Provider:   You may see Sanda Klein, MD or one of the following Advanced Practice Providers on your designated Care Team:        Signed, Sanda Klein, MD   06/11/2020 3:55 PM    [address], [place], [place]  [address] Phone: [phone]; Fax: [phone]

## 2020-06-12 ENCOUNTER — Other Ambulatory Visit: Payer: Self-pay | Admitting: Emergency Medicine

## 2020-06-12 ENCOUNTER — Telehealth: Payer: Self-pay | Admitting: Emergency Medicine

## 2020-06-12 NOTE — Telephone Encounter (Signed)
Patient calling, wanting to get a new patient appointment for her fiance with Dr. Mitchel Honour. When I told her it would be June for the first available she asked that Dr. Mitchel Honour calls her so she can talk to him about it

## 2020-06-13 ENCOUNTER — Other Ambulatory Visit: Payer: Self-pay | Admitting: *Deleted

## 2020-06-13 DIAGNOSIS — I5032 Chronic diastolic (congestive) heart failure: Secondary | ICD-10-CM

## 2020-06-17 NOTE — Telephone Encounter (Signed)
Patient called and said that she no longer needs a new pt appointment for her fiance

## 2020-06-17 NOTE — Telephone Encounter (Signed)
Left a message in voice mail to contact Dr Mitchel Honour using MyChart with the question about fiance appointment in June.

## 2020-06-18 DIAGNOSIS — S161XXD Strain of muscle, fascia and tendon at neck level, subsequent encounter: Secondary | ICD-10-CM | POA: Diagnosis not present

## 2020-06-18 DIAGNOSIS — M62838 Other muscle spasm: Secondary | ICD-10-CM | POA: Diagnosis not present

## 2020-06-20 DIAGNOSIS — S161XXD Strain of muscle, fascia and tendon at neck level, subsequent encounter: Secondary | ICD-10-CM | POA: Diagnosis not present

## 2020-06-20 DIAGNOSIS — M62838 Other muscle spasm: Secondary | ICD-10-CM | POA: Diagnosis not present

## 2020-06-26 ENCOUNTER — Ambulatory Visit: Payer: Medicare HMO | Admitting: Cardiovascular Disease

## 2020-06-26 ENCOUNTER — Other Ambulatory Visit: Payer: Self-pay

## 2020-06-26 ENCOUNTER — Encounter: Payer: Self-pay | Admitting: Cardiovascular Disease

## 2020-06-26 VITALS — BP 112/64 | HR 80 | Ht 64.0 in | Wt 146.4 lb

## 2020-06-26 DIAGNOSIS — I739 Peripheral vascular disease, unspecified: Secondary | ICD-10-CM

## 2020-06-26 DIAGNOSIS — I25810 Atherosclerosis of coronary artery bypass graft(s) without angina pectoris: Secondary | ICD-10-CM

## 2020-06-26 DIAGNOSIS — I10 Essential (primary) hypertension: Secondary | ICD-10-CM | POA: Diagnosis not present

## 2020-06-26 DIAGNOSIS — I701 Atherosclerosis of renal artery: Secondary | ICD-10-CM | POA: Diagnosis not present

## 2020-06-26 LAB — BASIC METABOLIC PANEL
BUN/Creatinine Ratio: 13 (ref 12–28)
BUN: 18 mg/dL (ref 8–27)
CO2: 25 mmol/L (ref 20–29)
Calcium: 9.7 mg/dL (ref 8.7–10.3)
Chloride: 97 mmol/L (ref 96–106)
Creatinine, Ser: 1.39 mg/dL — ABNORMAL HIGH (ref 0.57–1.00)
Glucose: 99 mg/dL (ref 65–99)
Potassium: 5.6 mmol/L — ABNORMAL HIGH (ref 3.5–5.2)
Sodium: 135 mmol/L (ref 134–144)
eGFR: 38 mL/min/{1.73_m2} — ABNORMAL LOW (ref >59)

## 2020-06-26 NOTE — Assessment & Plan Note (Signed)
History of PAD status post bilateral SFA intervention by myself in 2012.  I performed angiography on her 02/15/2017, directional atherectomy and drug-coated balloon angioplasty of the proximal right SFA.  Her right and left SFA stents at that time it 30 to 40% "in-stent restenosis with three-vessel runoff.  I can perform peripheral angiography on her 10/06/2018 revealing occluded right SFA just proximal to the previously placed stent reconstituting distal to the stent.  Was able to cross this balloon and I restented with overlapping Eluvia follow-up Doppler studies performed 05/09/2020 revealed a right ABI 1.06 and a left of 0.90 with patent stents bilaterally.  She denies claudication.  Drug-eluting stents.

## 2020-06-26 NOTE — Progress Notes (Signed)
Thanks

## 2020-06-26 NOTE — Progress Notes (Signed)
06/26/2020 [name]   10/21/1940  [id number]  Primary Physician Sagardia, Ines Bloomer, MD Primary Cardiologist: Lorretta Harp MD Garret Reddish, [place], [place]  HPI:  [name] is a 80 y.o.  moderately overweight engaged Caucasian female mother of 60, grandmother and 6 grandchildren who was referred by Dr. Sallyanne Kuster for peripheral vascular evaluation because otherwise tolerating claudication.I last saw her in the office  07/19/2019. She does have a history of PAF having undergone ablation at [facility] a month ago. She is on Tikosyn andEliquishowever she has not taken her Eliquisseveral weeks. She has a history of CABG 3 in 2016 and subsequent stent procedures because of early graft failure. She also has PAD status post right left SFA intervention in a staged fashion in 2012 by myself. Over the last year she's had recurrent right lower extreme claudication with Dopplers to suggest a high-frequency signal in her proximal right SFA.  I performed peripheral angiography and intervention on her 02/15/2017 with directional atherectomy and drug-coated balloon angioplasty of her proximal right SFA. Her right and left SFA stents had 30 to 40% "in-stent restenosis bilaterally and she has three-vessel runoff. Her post procedure Dopplers performed 03/12/2017 revealed ABIs of 1.14 bilaterally with triphasic waveforms. Her current Dopplers performed 05/04/2018 revealed a decline in her right ABI to .82with monophasic waveforms beyond the stented segment suggesting progression of "in-stent restenosis.  I performed peripheral angiography on her 10/06/2018 revealing occluded right SFA just proximal to the previously placed stent reconstituting distal to the stent.  I was able to get across this, balloon and restented with overlapping Iluvien drug-eluting stents.  Her follow-up Dopplers performed 10/18/2018 showed normal ABIs and her most recent Dopplers performed 05/10/2019 showed continued  patency.  She does complain of right hip discomfort with ambulation although she is never had aortoiliac disease in the past.  Since I saw her a year ago she did have an abdominal CTA performed 12/20/2019 that suggested right renal artery stenosis.  Her blood pressure is adequately controlled on 3 antihypertensive medications.  For unclear reasons her serum creatinine has increased up to 1.61 as recently as 06/11/2020 where previously it was in the 0.8 range.  Her lower extremity arterial Doppler studies performed 05/09/2020 revealed continued patency of her bilateral SFA stents.  She denies claudication.      Current Meds  Medication Sig  . alendronate (FOSAMAX) 70 MG tablet Take 70 mg by mouth every Saturday.  Marland Kitchen amLODipine (NORVASC) 5 MG tablet Take 1 tablet (5 mg total) by mouth in the morning and at bedtime.  Marland Kitchen aspirin EC 81 MG tablet Take 81 mg by mouth daily.   . Biotin 10000 MCG TABS Take 10,000 mcg by mouth daily.  . carvedilol (COREG) 6.25 MG tablet Take 1 tablet (6.25 mg total) by mouth 2 (two) times daily with a meal.  . Cholecalciferol (VITAMIN D3 PO) Take 1 tablet by mouth daily.  . citalopram (CELEXA) 40 MG tablet Take 1 tablet (40 mg total) by mouth daily.  . clopidogrel (PLAVIX) 75 MG tablet TAKE 1 TABLET (75 MG TOTAL) BY MOUTH DAILY WITH BREAKFAST.  Marland Kitchen dofetilide (TIKOSYN) 125 MCG capsule TAKE 1 CAPSULE  BY MOUTH EVERY 12 HOURS. ([place])  . furosemide (LASIX) 40 MG tablet Take 40 mg by mouth daily as needed for fluid or edema.  Marland Kitchen levothyroxine (SYNTHROID, LEVOTHROID) 25 MCG tablet Take 25 mcg by mouth daily before breakfast.   . losartan (COZAAR) 25 MG tablet  Take 25 mg by mouth 2 (two) times daily.  . Magnesium 200 MG TABS Take 200 mg by mouth daily. Patient takes 1 tablet daily  . Multiple Vitamins-Minerals (MULTI-VITE) LIQD Take 1 tablet by mouth daily.   . nitroGLYCERIN (NITROSTAT) 0.4 MG SL tablet Place 1 tablet (0.4 mg total) under the tongue every 5 (five)  minutes x 3 doses as needed for chest pain.  Jonna Coup Leaf Extract 250 MG CAPS Take 250 mg by mouth 2 (two) times daily.  . rosuvastatin (CRESTOR) 10 MG tablet Take 1 tablet (10 mg total) by mouth daily.  . vitamin C (ASCORBIC ACID) 500 MG tablet Take 500 mg by mouth daily.  . [DISCONTINUED] niacin 250 MG tablet Take 250 mg by mouth daily.     Allergies  Allergen Reactions  . Brilinta [Ticagrelor] Shortness Of Breath  . Atorvastatin Other (See Comments)    Possible cause of fatigue/malaise  . Clonidine Derivatives Other (See Comments)    Lowers heart rate  . Crestor [Rosuvastatin Calcium] Other (See Comments)    Joint/muscle aches  . Exforge [Amlodipine Besylate-Valsartan] Itching and Rash  . Spironolactone Other (See Comments)    Contraindicated with history of hyperkalemia    Social History   Socioeconomic History  . Marital status: Widowed    Spouse name: Not on file  . Number of children: 3  . Years of education: Not on file  . Highest education level: Not on file  Occupational History  . 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 Smoking status: Former Smoker    Packs/day: 0.75    Years: 10.00    Pack years: 7.50    Types: Cigarettes    Quit date: 2008    Years since quitting: 14.3  . Smokeless tobacco: Never Used  . Tobacco comment: 01/21/2012 "quit smoking ~ 2002"  Vaping Use  . Vaping Use: Never used  Substance and Sexual Activity  . Alcohol use: Yes    Comment: daily coctail  . Drug use: No  . Sexual activity: Not Currently  Other Topics Concern  . Not on file  Social History Narrative   Lives in [place].  Retired.   Social Determinants of Health   Financial Resource Strain: Not on file  Food Insecurity: Not on file  Transportation Needs: Not on file  Physical Activity: Not on file  Stress: Not on file  Social Connections: Not on file  Intimate Partner Violence: Not on file     Review of Systems: General:  negative for chills, fever, night sweats or weight changes.  Cardiovascular: negative for chest pain, dyspnea on exertion, edema, orthopnea, palpitations, paroxysmal nocturnal dyspnea or shortness of breath Dermatological: negative for rash Respiratory: negative for cough or wheezing Urologic: negative for hematuria Abdominal: negative for nausea, vomiting, diarrhea, bright red blood per rectum, melena, or hematemesis Neurologic: negative for visual changes, syncope, or dizziness All other systems reviewed and are otherwise negative except as noted above.    Blood pressure 112/64, pulse 80, height 5\' 4"  (1.626 m), weight 146 lb 6.4 oz (66.4 kg), SpO2 97 %.  General appearance: alert and no distress Neck: no adenopathy, no carotid bruit, no JVD, supple, symmetrical, trachea midline and thyroid not enlarged, symmetric, no tenderness/mass/nodules Lungs: clear to auscultation bilaterally Heart: regular rate and rhythm, S1, S2 normal, no murmur, click, rub or gallop Extremities: extremities normal, atraumatic, no cyanosis or edema Pulses: 2+ and symmetric Skin: Skin color, texture, turgor normal. No  rashes or lesions Neurologic: Alert and oriented X 3, normal strength and tone. Normal symmetric reflexes. Normal coordination and gait  EKG atrially paced rhythm at 80 with septal Q waves.  I personally reviewed this EKG.  ASSESSMENT AND PLAN:   Claudication in peripheral vascular disease ([place]) History of PAD status post bilateral SFA intervention by myself in 2012.  I performed angiography on her 02/15/2017, directional atherectomy and drug-coated balloon angioplasty of the proximal right SFA.  Her right and left SFA stents at that time it 30 to 40% "in-stent restenosis with three-vessel runoff.  I can perform peripheral angiography on her 10/06/2018 revealing occluded right SFA just proximal to the previously placed stent reconstituting distal to the stent.  Was able to cross this balloon and I  restented with overlapping Eluvia follow-up Doppler studies performed 05/09/2020 revealed a right ABI 1.06 and a left of 0.90 with patent stents bilaterally.  She denies claudication.  Drug-eluting stents.  Renal artery stenosis ([facility]) Abdominal CTA performed 12/20/2019 suggested right renal artery stenosis.  She does have hypertension on 3 antihypertensive medications although her blood pressure today is 112/64.  Her serum creatinine has increased for unclear reasons up to 1.61 on 06/11/2020.  This will be repeated.  I am going to get renal Doppler studies to further follow-up.      Lorretta Harp MD FACP,FACC,FAHA, [facility] 06/26/2020 11:30 AM

## 2020-06-26 NOTE — Patient Instructions (Signed)
Medication Instructions:  Your physician recommends that you continue on your current medications as directed. Please refer to the Current Medication list given to you today.  *If you need a refill on your cardiac medications before your next appointment, please call your pharmacy*   Lab Work: Your physician recommends that you have labs drawn today: BMET  If you have labs (blood work) drawn today and your tests are completely normal, you will receive your results only by: Marland Kitchen MyChart Message (if you have MyChart) OR . A paper copy in the mail If you have any lab test that is abnormal or we need to change your treatment, we will call you to review the results.   Testing/Procedures: Your physician has requested that you have a renal artery duplex. During this test, an ultrasound is used to evaluate blood flow to the kidneys. Allow one hour for this exam. Do not eat after midnight the day before and avoid carbonated beverages. Take your medications as you usually do.   This procedure is done at [facility]. 2nd Floor   Follow-Up: At [facility], you and your health needs are our priority.  As part of our continuing mission to provide you with exceptional heart care, we have created designated Provider Care Teams.  These Care Teams include your primary Cardiologist (physician) and Advanced Practice Providers (APPs -  Physician Assistants and Nurse Practitioners) who all work together to provide you with the care you need, when you need it.  We recommend signing up for the patient portal called "MyChart".  Sign up information is provided on this After Visit Summary.  MyChart is used to connect with patients for Virtual Visits (Telemedicine).  Patients are able to view lab/test results, encounter notes, upcoming appointments, etc.  Non-urgent messages can be sent to your provider as well.   To learn more about what you can do with MyChart, go to [email or web address].    Your next  appointment:   3 month(s)  The format for your next appointment:   In Person  Provider:   Quay Burow, MD

## 2020-06-26 NOTE — Assessment & Plan Note (Signed)
Abdominal CTA performed 12/20/2019 suggested right renal artery stenosis.  She does have hypertension on 3 antihypertensive medications although her blood pressure today is 112/64.  Her serum creatinine has increased for unclear reasons up to 1.61 on 06/11/2020.  This will be repeated.  I am going to get renal Doppler studies to further follow-up.

## 2020-07-01 DIAGNOSIS — M542 Cervicalgia: Secondary | ICD-10-CM | POA: Diagnosis not present

## 2020-07-02 ENCOUNTER — Other Ambulatory Visit: Payer: Self-pay

## 2020-07-02 ENCOUNTER — Telehealth: Payer: Self-pay

## 2020-07-02 DIAGNOSIS — E875 Hyperkalemia: Secondary | ICD-10-CM

## 2020-07-02 NOTE — Telephone Encounter (Signed)
Spoke with pt regarding repeat labs for increased potassium levels. Pt's questions answered. Pt verbalizes understanding for need to have additional labs drawn. Pt will have these done in the next week or so.

## 2020-07-05 ENCOUNTER — Ambulatory Visit (HOSPITAL_COMMUNITY)
Admission: RE | Admit: 2020-07-05 | Discharge: 2020-07-05 | Disposition: A | Discharge status: Home / Self Care | Payer: Medicare HMO | Source: Ambulatory Visit | Attending: Cardiovascular Disease | Admitting: Cardiovascular Disease

## 2020-07-05 ENCOUNTER — Other Ambulatory Visit: Payer: Self-pay

## 2020-07-05 DIAGNOSIS — I701 Atherosclerosis of renal artery: Secondary | ICD-10-CM

## 2020-07-05 DIAGNOSIS — I5032 Chronic diastolic (congestive) heart failure: Secondary | ICD-10-CM | POA: Diagnosis not present

## 2020-07-05 LAB — BASIC METABOLIC PANEL
BUN/Creatinine Ratio: 18 (ref 12–28)
BUN: 22 mg/dL (ref 8–27)
CO2: 25 mmol/L (ref 20–29)
Calcium: 9.7 mg/dL (ref 8.7–10.3)
Chloride: 97 mmol/L (ref 96–106)
Creatinine, Ser: 1.25 mg/dL — ABNORMAL HIGH (ref 0.57–1.00)
Glucose: 90 mg/dL (ref 65–99)
Potassium: 5.1 mmol/L (ref 3.5–5.2)
Sodium: 136 mmol/L (ref 134–144)
eGFR: 44 mL/min/{1.73_m2} — ABNORMAL LOW (ref >59)

## 2020-07-18 ENCOUNTER — Telehealth: Payer: Self-pay

## 2020-07-18 NOTE — Telephone Encounter (Signed)
Spoke with pt regarding appointment with Dr. Gwenlyn Found to review results of renal ultrasound. Pt able to make appointment for 6/29. Pt verbalizes understanding.

## 2020-07-18 NOTE — Telephone Encounter (Signed)
From patient MyChart message: For a month or so I have had a feeling of pressure on my chest that hurts through my back. I have experienced some flutter also in my chest. This feeling is keeping me from sleeping and waking me up out of my sleep. Could this be heart related or indigestion?    Called patient and she reports chest pressure, fluttering, feelings are limiting sleep. She has not used PRN NTG.   She reports she almost called EMS last week due to pain in chest/back -- she felt like she had to take a deep breath, had to sit up to breath easy.   She did not feel well today after lunch, came home and laid down.   She cannot attribute the flutters to any identifiable cause.   Scheduled for OV with MD tomorrow @ 8am  Advised on ED precautions

## 2020-07-19 ENCOUNTER — Encounter: Payer: Self-pay | Admitting: Cardiovascular Disease

## 2020-07-19 ENCOUNTER — Other Ambulatory Visit: Payer: Self-pay

## 2020-07-19 ENCOUNTER — Ambulatory Visit (INDEPENDENT_AMBULATORY_CARE_PROVIDER_SITE_OTHER): Payer: Medicare HMO | Admitting: Cardiovascular Disease

## 2020-07-19 VITALS — BP 124/62 | HR 91 | Ht 64.0 in | Wt 146.0 lb

## 2020-07-19 DIAGNOSIS — I739 Peripheral vascular disease, unspecified: Secondary | ICD-10-CM

## 2020-07-19 DIAGNOSIS — Z950 Presence of cardiac pacemaker: Secondary | ICD-10-CM | POA: Diagnosis not present

## 2020-07-19 DIAGNOSIS — I25708 Atherosclerosis of coronary artery bypass graft(s), unspecified, with other forms of angina pectoris: Secondary | ICD-10-CM | POA: Diagnosis not present

## 2020-07-19 DIAGNOSIS — R072 Precordial pain: Secondary | ICD-10-CM

## 2020-07-19 DIAGNOSIS — I10 Essential (primary) hypertension: Secondary | ICD-10-CM

## 2020-07-19 DIAGNOSIS — I495 Sick sinus syndrome: Secondary | ICD-10-CM | POA: Diagnosis not present

## 2020-07-19 DIAGNOSIS — I701 Atherosclerosis of renal artery: Secondary | ICD-10-CM | POA: Diagnosis not present

## 2020-07-19 DIAGNOSIS — Z5181 Encounter for therapeutic drug level monitoring: Secondary | ICD-10-CM | POA: Diagnosis not present

## 2020-07-19 DIAGNOSIS — I480 Paroxysmal atrial fibrillation: Secondary | ICD-10-CM

## 2020-07-19 DIAGNOSIS — I5032 Chronic diastolic (congestive) heart failure: Secondary | ICD-10-CM | POA: Diagnosis not present

## 2020-07-19 DIAGNOSIS — I679 Cerebrovascular disease, unspecified: Secondary | ICD-10-CM | POA: Diagnosis not present

## 2020-07-19 DIAGNOSIS — E7800 Pure hypercholesterolemia, unspecified: Secondary | ICD-10-CM

## 2020-07-19 DIAGNOSIS — E213 Hyperparathyroidism, unspecified: Secondary | ICD-10-CM

## 2020-07-19 DIAGNOSIS — Z79899 Other long term (current) drug therapy: Secondary | ICD-10-CM

## 2020-07-19 LAB — PACEMAKER DEVICE OBSERVATION

## 2020-07-19 MED ORDER — FUROSEMIDE 40 MG PO TABS: 40.0000 mg | ORAL_TABLET | ORAL | 2 refills | Status: DC

## 2020-07-19 NOTE — Patient Instructions (Addendum)
Medication Instructions:  TAKE the Furosemide every other day HOLD the Alendronate for the next 4 weeks STOP the Valsartan  *If you need a refill on your cardiac medications before your next appointment, please call your pharmacy*   Lab Work: None ordered If you have labs (blood work) drawn today and your tests are completely normal, you will receive your results only by: [place] (if you have MyChart) OR A paper copy in the mail If you have any lab test that is abnormal or we need to change your treatment, we will call you to review the results.   Testing/Procedures: Your physician has requested that you have an echocardiogram. Echocardiography is a painless test that uses sound waves to create images of your heart. It provides your doctor with information about the size and shape of your heart and how well your heart's chambers and valves are working. You may receive an ultrasound enhancing agent through an IV if needed to better visualize your heart during the echo.This procedure takes approximately one hour. There are no restrictions for this procedure. This will take place at the [address].     Follow-Up: At [facility], you and your health needs are our priority.  As part of our continuing mission to provide you with exceptional heart care, we have created designated Provider Care Teams.  These Care Teams include your primary Cardiologist (physician) and Advanced Practice Providers (APPs -  Physician Assistants and Nurse Practitioners) who all work together to provide you with the care you need, when you need it.  We recommend signing up for the patient portal called "MyChart".  Sign up information is provided on this After Visit Summary.  MyChart is used to connect with patients for Virtual Visits (Telemedicine).  Patients are able to view lab/test results, encounter notes, upcoming appointments, etc.  Non-urgent messages can be sent to your provider as  well.   To learn more about what you can do with MyChart, go to [email or web address].    Your next appointment:   Keep your follow up as scheduled with Dr. Gwenlyn Found

## 2020-07-19 NOTE — Progress Notes (Signed)
Patient ID: [name], female   DOB: 13-Jul-1940, 80 y.o.   MRN: [id number]     Cardiology Office Note    Date:  07/19/2020   ID:  Clarise, Chacko [birth year], MRN [id number]  PCP:  Horald Pollen, MD  Cardiologist:   Sanda Klein, MD   Chief Complaint  Patient presents with   Follow-up   Chest Pain   Headache   Fatigue   Edema    Ankles.    History of Present Illness:  [name] is a 80 y.o. female with coronary artery disease s/p bypass surgery (November 2016) and placement of drug-eluting stents (DES to distal SVG-RCA, April 2017 ) and left atrial appendage clipping, symptomatic paroxysmal atrial fibrillation and atrial flutter with radiofrequency ablation (cavotricuspid isthmus and pulmonary vein isolation), PAD with previous stents, extensive intracranial atherosclerotic disease, history of GI bleeding while on anticoagulation, volatile hypertension.  She recovered well from COVID-19 infection in August 2021.  She was hospitalized with abdominal pain at [facility] on April 22, 2020.  Her work-up included an abdominal CT that showed a relatively smaller size right kidney and suspicion for high-grade right renal artery stenosis.  Her creatinine was 1.1 at that time.  Incidental note was also made of a 1.6 cm right adrenal mass and prominent atherosclerosis in the infrarenal abdominal aorta. There was evidence of colonic distention and constipation which was felt to be the cause of her symptoms.  Over the last month or so she has developed chest discomfort.  She calls this "pressure" and uses a gripped hand in front of her mid sternum to describe it.  It radiates through to her back.  It is always worse when she lays down at night and sometimes she has to sit up for relief.  It associates some dyspnea when she lays down.  Sometimes will wake her up later in the night as well and she finds it hard to go back to sleep.  Intermittently, it  has also occurred while walking around, but clearly nocturnal symptoms are worse.  The other day, she went out for lunch with a friend and when she came home an hour later laid down in bed and the symptoms were severe, again relieved by sitting up.  Sometimes associates brief palpitations.  She has not had dizziness or syncope.  She has mild chronic swelling in the legs.  She has not taken any furosemide in months.  She is on a tiny dose of valsartan and wonders whether she really needs it.  And amlodipine and her blood pressure is excellent.  ECG early March showed excessive QTc prolongation at 523 ms and we cut back her dofetilide to 125 mcg Q12h. Today QTc is 496 ms.  Interrogation of her pacemaker today shows no atrial fibrillation in the last 6 months.  She has had a couple of episodes of mode switch there were each shorter than a minute.  She has virtually 100% atrial paced, ventricular sensed rhythm.  There have been no episodes of high ventricular rate.  Lead parameters are excellent.  Estimated generator longevity is 4.5 years.  Rate histogram distribution is excellent as well.  She has a history of hyperkalemia with ACE inhibitors in the past.  Amiodarone caused hypothyroidism and alopecia. She has tried taking statins but has repeatedly stopped them due to musculoskeletal problems.  She seems to tolerate the current 10 mg dose of rosuvastatin.  Have not rechecked her LDL cholesterol  since last November 2020 when it was 140 without any therapy.  In late August 2020 she underwent an abdominal aortic angiogram with runoff, followed by angioplasty and drug coated stenting of a chronic total occlusion of the right superficial femoral artery. Preprocedure ABI was 0.82  Post procedure ABI was 1.1 at follow-up on October 18 2018, 1.0 in March 2021 (bilaterally).  By ultrasound performed in July 2021, known occluded mid right SFA stent with reconstitution above the level of the popliteal artery and  three-vessel runoff, 80% stenosis in the proximal left SFA with widely patent mid left SFA stent.  She has normal left ventricular systolic function. She does have evidence of grade 2 diastolic dysfunction on previous echo. At the time of her bypass surgery she received an Atricure left atrial appendage clip. She is also on clopidogrel following her drug-eluting stents in April 2017. The pacemaker was implanted in 2013 for symptomatic sinus bradycardia. She also has a history of peripheral arterial disease and received bilateral superficial femoral artery stents in 2016, repeat revascularization with drug-coated stent to a totally occluded right superficial femoral artery in 2020. She has never smoked but drinks daily. She has treated hypertension.  After undergoing bypass surgery in November 2016 she returned with non-ST segment elevation myocardial infarction in April 2017 and required placement of stents both in the native right coronary artery (3.020 mm Synergy DES) and in the saphenous vein graft to the distal right coronary artery (2.7538 mm Synergy DES) due to early graft dysfunction. The saphenous vein graft to the ramus intermedius was also occluded, but this vessel was left for medical therapy.    Past Medical History:  Diagnosis Date   ANXIETY 12/31/2009   Aortic insufficiency    a. mod by echo 2017.   CAD (coronary artery disease)    a. s/p CABGx3 and LAA clipping in 12/2014, NSTEMI 05/2015 s/p DES to native RCA and SVG-dRCA; occluded ramus-SVG was treated medically). c. neg [facility]uc 10/2015 at [facility].   Chronic diastolic CHF (congestive heart failure) (HCC)    Chronic fatigue    CKD (chronic kidney disease), stage II    DEPRESSION 12/31/2009   [place] DISEASE, CERVICAL 12/31/2009   [place] DISEASE, LUMBAR 12/31/2009   DIVERTICULITIS, HX OF 12/31/2009   Essential hypertension    Gait abnormality 03/25/2017   GLUCOSE INTOLERANCE 12/31/2009   Habitual alcohol use    History of stroke    self  reports " they say i have had some pin strokes"    Hypercalcemia    Hyperkalemia    Hyperlipidemia 12/31/2009   Hypothyroidism    MENOPAUSE, EARLY 12/31/2009   NSTEMI (non-ST elevated myocardial infarction) ([place]) 05/11/2015   Orthostatic hypotension    OSTEOARTHRITIS, HAND    Pacemaker 01/21/2012   MDT Adapta dual chamber   Paroxysmal atrial flutter ([place])    Persistent atrial fibrillation (HCC)    PVD (peripheral vascular disease), hx stents to bil SFAs 02/2010    Rash, skin     superficial raised red pencil point sized rash bilateral forearms; states " it started when i started the Plavix "    Ruptured left breast implant    Symptomatic sinus bradycardia 01/22/2012   Syncope    a. 11/2015 ? due to medications.   Syncope    reports on 05-10-17 " i passed out 2 months ago in the bathroom and broke some ribs"     Tachy-brady syndrome ([place])    a. s/p MDT PPM 2013.   Tricuspid  regurgitation     Past Surgical History:  Procedure Laterality Date   ABDOMINAL AORTOGRAM W/LOWER EXTREMITY Bilateral 02/15/2017   Procedure: ABDOMINAL AORTOGRAM W/LOWER EXTREMITY;  Surgeon: Lorretta Harp, MD;  Location: [place] [facility];  Service: Cardiovascular;  Laterality: Bilateral;  bilat   ABDOMINAL AORTOGRAM W/LOWER EXTREMITY N/A 10/06/2018   Procedure: ABDOMINAL AORTOGRAM W/LOWER EXTREMITY;  Surgeon: Lorretta Harp, MD;  Location: [place] [facility];  Service: Cardiovascular;  Laterality: N/A;   Ablation of typical atrial flutter (CTI line)  09/05/2016   Dr Antonieta Pert at [place]   Removed 01/2018   BREAST IMPLANT REMOVAL Bilateral 01/14/2018   Procedure: REMOVAL BILATERAL BREAST IMPLANTS;  Surgeon: Irene Limbo, MD;  Location: [place];  Service: Plastics;  Laterality: Bilateral;   CAPSULECTOMY Bilateral 01/14/2018   Procedure: CAPSULECTOMY;  Surgeon: Irene Limbo, MD;  Location: [place];  Service: Plastics;  Laterality:  Bilateral;   CARDIAC CATHETERIZATION N/A 01/01/2015   Procedure: Left Heart Cath and Coronary Angiography;  Surgeon: Jettie Booze, MD;  Location: [place] [facility];  Service: Cardiovascular;  Laterality: N/A;   CARDIAC CATHETERIZATION N/A 05/12/2015   Procedure: Left Heart Cath and Coronary Angiography;  Surgeon: Troy Sine, MD;  Location: [place] [facility];  Service: Cardiovascular;  Laterality: N/A;   CARDIAC CATHETERIZATION N/A 05/12/2015   Procedure: Coronary Stent Intervention;  Surgeon: Troy Sine, MD;  Location: [place] [facility];  Service: Cardiovascular;  Laterality: N/A;   CARDIOVERSION N/A 02/01/2015   Procedure: CARDIOVERSION;  Surgeon: Lelon Perla, MD;  Location[facility] ENDOSCOPY;  Service: Cardiovascular;  Laterality: N/A;   CARDIOVERSION N/A 08/30/2015   Procedure: CARDIOVERSION;  Surgeon: Sanda Klein, MD;  Location: [place] ENDOSCOPY;  Service: Cardiovascular;  Laterality: N/A;   CARPAL TUNNEL RELEASE  2008   "right hand/thumb; carpal tunnel repair; got rid of arthritis" (01/21/2012)   CLIPPING OF ATRIAL APPENDAGE N/A 01/04/2015   Procedure: CLIPPING OF ATRIAL APPENDAGE;  Surgeon: Grace Isaac, MD;  Location: [place];  Service: Open Heart Surgery;  Laterality: N/A;   COLONOSCOPY N/A 12/21/2015   Procedure: COLONOSCOPY;  Surgeon: Milus Banister, MD;  Location: [facility] ENDOSCOPY;  Service: Endoscopy;  Laterality: N/A;   CORONARY ARTERY BYPASS GRAFT N/A 01/04/2015   Procedure: CORONARY ARTERY BYPASS GRAFTING (CABG) x 3 using left internal mammory artery and greater saphenous vein right leg harvested endoscopically.;  Surgeon: Grace Isaac, MD;  LIMA-LAD, SVG-RI, SVG-PDA   ESOPHAGOGASTRODUODENOSCOPY (EGD) WITH PROPOFOL N/A 12/18/2015   Procedure: ESOPHAGOGASTRODUODENOSCOPY (EGD) WITH PROPOFOL;  Surgeon: Milus Banister, MD;  Location: [facility] ENDOSCOPY;  Service: Endoscopy;  Laterality: N/A;   FACELIFT, LOWER 2/3  1995   "mini" (01/21/2012)   GIVENS CAPSULE STUDY N/A  12/21/2015   Procedure: GIVENS CAPSULE STUDY;  Surgeon: Milus Banister, MD;  Location: [facility] ENDOS[facility];  Service: Endoscopy;  Laterality: N/A;   Lower Arterial Examination  10/28/2011   R. SFA stent mild-moderate mixed density plaque with elevated velocities consistent with 50% diameter reduction. L. SFA stent moderate mixed denisty plaque at mid to distal level consistent with 50-69% diameter reduction.   LOWER EXTREMITY ANGIOGRAPHY N/A 02/15/2017   Procedure: LOWER EXTREMITY ANGIOGRAPHY;  Surgeon: Lorretta Harp, MD;  Location: [place] [facility];  Service: Cardiovascular;  Laterality: N/A;   OOPHORECTOMY  ~1979   PARATHYROIDECTOMY N/A 05/13/2017   Procedure: PARATHYROIDECTOMY;  Surgeon: Armandina Gemma, MD;  Location: [facility];  Service: General;  Laterality: N/A;   PARTIAL COLECTOMY  2010   PERIPHERAL ARTERIAL STENT GRAFT  2012; 2012   "LLE; RLE" (01/21/2012)   PERIPHERAL VASCULAR BALLOON ANGIOPLASTY Right 02/15/2017   Procedure: PERIPHERAL VASCULAR BALLOON ANGIOPLASTY;  Surgeon: Lorretta Harp, MD;  Location: [place] [facility];  Service: Cardiovascular;  Laterality: Right;  SFA     PERIPHERAL VASCULAR INTERVENTION Right 10/06/2018   Procedure: PERIPHERAL VASCULAR INTERVENTION;  Surgeon: Lorretta Harp, MD;  Location: [place] [facility];  Service: Cardiovascular;  Laterality: Right;   PERMANENT PACEMAKER INSERTION N/A 01/21/2012   Medtronic Adapta L implanted by Dr Sallyanne Kuster for tachy/brady syndrome   POSTERIOR CERVICAL LAMINECTOMY  1985   TEE WITHOUT CARDIOVERSION N/A 01/04/2015   Procedure: TRANSESOPHAGEAL ECHOCARDIOGRAM (TEE);  Surgeon: Grace Isaac, MD;  Location: [place];  Service: Open Heart Surgery;  Laterality: N/A;   TEE WITHOUT CARDIOVERSION N/A 02/01/2015   Procedure: TRANSESOPHAGEAL ECHOCARDIOGRAM (TEE);  Surgeon: Lelon Perla, MD;  Location: [facility] ENDOSCOPY;  Service: Cardiovascular;  Laterality: N/A;   TEE WITHOUT CARDIOVERSION N/A 08/30/2015   Procedure: TRANSESOPHAGEAL  ECHOCARDIOGRAM (TEE);  Surgeon: Sanda Klein, MD;  Location: [facility] ENDOSCOPY;  Service: Cardiovascular;  Laterality: N/A;   VAGINAL HYSTERECTOMY  1975    Current Medications: Outpatient Medications Prior to Visit  Medication Sig Dispense Refill   alendronate (FOSAMAX) 70 MG tablet Take 70 mg by mouth every Saturday.     amLODipine (NORVASC) 5 MG tablet Take 1 tablet (5 mg total) by mouth in the morning and at bedtime. 180 tablet 2   aspirin EC 81 MG tablet Take 81 mg by mouth daily.      Biotin 10000 MCG TABS Take 10,000 mcg by mouth daily.     carvedilol (COREG) 6.25 MG tablet Take 1 tablet (6.25 mg total) by mouth 2 (two) times daily with a meal. 60 tablet 5   Cholecalciferol (VITAMIN D3 PO) Take 1 tablet by mouth daily.     citalopram (CELEXA) 40 MG tablet Take 1 tablet (40 mg total) by mouth daily. 90 tablet 1   clopidogrel (PLAVIX) 75 MG tablet TAKE 1 TABLET (75 MG TOTAL) BY MOUTH DAILY WITH BREAKFAST. 90 tablet 3   dofetilide (TIKOSYN) 125 MCG capsule TAKE 1 CAPSULE  BY MOUTH EVERY 12 HOURS. (GENERIC FOR TIKOSYN) 180 capsule 2   levothyroxine (SYNTHROID, LEVOTHROID) 25 MCG tablet Take 25 mcg by mouth daily before breakfast.      Magnesium 200 MG TABS Take 200 mg by mouth daily. Patient takes 1 tablet daily     Multiple Vitamins-Minerals (MULTI-VITE) LIQD Take 1 tablet by mouth daily.      nitroGLYCERIN (NITROSTAT) 0.4 MG SL tablet Place 1 tablet (0.4 mg total) under the tongue every 5 (five) minutes x 3 doses as needed for chest pain. 25 tablet 2   Olive Leaf Extract 250 MG CAPS Take 250 mg by mouth 2 (two) times daily.     rosuvastatin (CRESTOR) 10 MG tablet Take 1 tablet (10 mg total) by mouth daily. 90 tablet 3   vitamin C (ASCORBIC ACID) 500 MG tablet Take 500 mg by mouth daily.     furosemide (LASIX) 40 MG tablet Take 40 mg by mouth daily as needed for fluid or edema.     valsartan (DIOVAN) 40 MG tablet Take 40 mg by mouth daily.     No facility-administered medications prior to  visit.     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  Retired.   Social Determinants of Health   Financial Resource Strain: Not on file  Food Insecurity: Not on file  Transportation Needs: Not on file  Physical Activity: Not on file  Stress: Not on file  Social Connections: Not on file     Family History:  The patient's family history includes CVA in her brother; Heart attack in her brother; Heart disease in her brother, father, and mother; Hypertension in her brother and mother; Stroke in her father and mother.   ROS:   Please see the history of present illness.   All other systems are reviewed and are negative.   PHYSICAL EXAM:   VS:  BP 124/62 (BP Location: Left Arm, Patient Position: Sitting, Cuff Size: Normal)   Pulse 91   Ht 5\' 4"  (1.626 m)   Wt 146 lb (66.2 kg)   BMI 25.06 kg/m     Wt Readings from Last 3 Encounters:  07/19/20 146 lb (66.2 kg)  06/26/20 146 lb 6.4 oz (66.4 kg)  06/11/20 142 lb 9.6 oz (64.7 kg)     General: Alert, oriented x3, no distress, well healed  pacemaker site Head: no evidence of trauma, PERRL, EOMI, no exophtalmos or lid lag, no myxedema, no xanthelasma; normal ears, nose and oropharynx Neck: normal jugular venous pulsations and no hepatojugular reflux; brisk carotid pulses without delay and no carotid bruits Chest: clear to auscultation, no signs of consolidation by percussion or palpation, normal fremitus, symmetrical and full respiratory excursions Cardiovascular: normal position and quality of the apical impulse, regular rhythm, normal first and second heart sounds, no murmurs, rubs or gallops Abdomen: no tenderness or distention, no masses by palpation, no abnormal pulsatility or arterial bruits, normal bowel sounds, no hepatosplenomegaly Extremities: no clubbing, cyanosis or edema; 2+ radial, ulnar and brachial pulses bilaterally; 2+ right femoral, posterior tibial and dorsalis pedis pulses; 2+ left femoral, posterior tibial and dorsalis pedis pulses; no subclavian or femoral bruits Neurological: grossly nonfocal Psych: Normal mood and affect   Studies/Labs Reviewed:   EKG:  EKG is ordered today.  Personally reviewed shows atrial paced, ventricular sensed rhythm with a long AV delay of 224 ms, QS pattern in leads V1-V2, fairly subtle and old ST segment depression in leads I, aVL, V6, QTC 496 ms on dofetilide therapy.  No change from her tracing 06/26/2020 Recent Labs: 10/15/2019: Magnesium 2.1 12/20/2019: Hemoglobin 14.3; Platelets 301 06/11/2020: ALT 13 07/05/2020: BUN 22; Creatinine, Ser 1.25; Potassium 5.1; Sodium 136   Lipid Panel  Component Value Date/Time   CHOL 202 (H) 06/11/2020 0952   TRIG 177 (H) 06/11/2020 0952   HDL 89 06/11/2020 0952   CHOLHDL 2.3 06/11/2020 0952   CHOLHDL 2.5 05/12/2015 0324   VLDL 23 05/12/2015 0324   LDLCALC 84 06/11/2020 0952    ASSESSMENT:    1. Precordial pain   2. PAF (paroxysmal atrial fibrillation) ([place])   3. Encounter for monitoring dofetilide therapy   4. SSS (sick sinus  syndrome) ([place])   5. Pacemaker   6. Chronic diastolic heart failure ([place])   7. Coronary artery disease of bypass graft of native heart with stable angina pectoris ([place])   8. PAD (peripheral artery disease) ([place])   9. Renal artery stenosis ([place])   10. Small vessel disease, cerebrovascular   11. Essential hypertension   12. Hyperparathyroidism ([place])   13. Hypercholesterolemia      PLAN:  In order of problems listed above:  Chest pain: She has extensive coronary problems and obviously could be developing angina pectoris, but the positional pattern of her symptoms is quite striking.  This may be an orthopnea equivalent.  We will ask her to take furosemide every other day and schedule an echocardiogram.  Pericardial pain can also be positional, but she does not have typical pleuritic pain and there are no significant changes on her ECG from baseline.  Finally it is possible that she has esophageal pain/esophageal reflux which would also be striking and its positional pattern.  Asked her to avoid laying flat for least a couple hours after meals.  Also asked her to discontinue her alendronate for 4 weeks to see if that leads to any improvement.   AFib: None seen in at least the last 6 months.  She has had clipping over left atrial appendage and is not on anticoagulation.  She does take aspirin and clopidogrel for PAD with stents in both lower extremities.   Dofetilide: QTc remains in acceptable range after cutting the dofetilide dose to 125 mcg twice daily, without arrhythmia recurrence. SSS: Virtually 100% atrial paced.  Heart rate histograms are good current sensor settings are appropriate. PPM:  Checked today, normal function. She has 5076 atrial and ventricular leads and a Medtronic Adapta generator.  Continue remote downloads every 3 months.  Normal device check in the office today. CHF: Has had diastolic heart failure.  We will start furosemide every other day.  She has a tendency to develop  hyperkalemia and will not give her a potassium supplement.  Her blood pressure has been relatively low.  We will discontinue the tiny dose of valsartan. CAD: CCS functional class I until recently, on carvedilol and amlodipine.  Unfortunately, she had early failure of 2 saphenous vein grafts, one of which was treated with a drug-eluting stent. Her mammary artery bypass was widely patent at the most recent cardiac catheterization in April 2017. PAD: Seeing Dr. Quay Burow later this month.  Improved symptoms following her last revascularization procedure in August 2021.  Despite restenosis on one side, she has ABI 1.0 bilaterally and does not have intermittent claudication.  On dual antiplatelet therapy.  Also noted that she has lower blood pressure in the left arm suggesting left subclavian stenosis. Has a follow up with Dr. Gwenlyn Found in a few weeks. Right renal artery stenosis: per CTA in [place] there is suspicion for R renal artery stenosis and a reduction in kidney size, but BP is controlled and renal function normal.  Repeat duplex ultrasound showed: Occluded right renal artery  with a small atrophic non functioning kidney. Left kidney is OK ASCVD: She has mostly small vessel disease and the plan is for her to remain on clopidogrel indefinitely.  Avoid hypotension.  Allow SBP around 140.  Discontinue valsartan. HTN: Blood pressure has been relatively low recently.  We will discontinue the valsartan.  Blood pressure in the desirable range today (note that she has lower blood pressure in the left arm and should always check blood pressure on the right). Avoid ACE inhibitors due to history of hyperkalemia while taking lisinopril. 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  Normal calcium levels by most recent labs performed November 2021. Hypercholesterolemia: Intolerant to multiple statins (myalgia, hair loss).  Seems to tolerate rosuvastatin low dose. We were trying to get  her on Repatha.  Most recent LDL 84 is not ideal, but is partly counterbalanced by her excellent HDL of 89.    Medication Adjustments/Labs and Tests Ordered: Current medicines are reviewed at length with the patient today.  Concerns regarding medicines are outlined above.  Medication changes, Labs and Tests ordered today are listed in the Patient Instructions below. Patient Instructions  Medication Instructions:  TAKE the Furosemide every other day HOLD the Alendronate for the next 4 weeks STOP the Valsartan  *If you need a refill on your cardiac medications before your next appointment, please call your pharmacy*   Lab Work: None ordered If you have labs (blood work) drawn today and your tests are completely normal, you will receive your results only by: [place] (if you have MyChart) OR A paper copy in the mail If you have any lab test that is abnormal or we need to change your treatment, we will call you to review the results.   Testing/Procedures: Your physician has requested that you have an echocardiogram. Echocardiography is a painless test that uses sound waves to create images of your heart. It provides your doctor with information about the size and shape of your heart and how well your heart's chambers and valves are working. You may receive an ultrasound enhancing agent through an IV if needed to better visualize your heart during the echo.This procedure takes approximately one hour. There are no restrictions for this procedure. This will take place at the [address].     Follow-Up: At [facility], you and your health needs are our priority.  As part of our continuing mission to provide you with exceptional heart care, we have created designated Provider Care Teams.  These Care Teams include your primary Cardiologist (physician) and Advanced Practice Providers (APPs -  Physician Assistants and Nurse Practitioners) who all work together to provide  you with the care you need, when you need it.  We recommend signing up for the patient portal called "MyChart".  Sign up information is provided on this After Visit Summary.  MyChart is used to connect with patients for Virtual Visits (Telemedicine).  Patients are able to view lab/test results, encounter notes, upcoming appointments, etc.  Non-urgent messages can be sent to your provider as well.   To learn more about what you can do with MyChart, go to [email or web address].    Your next appointment:   Keep your follow up as scheduled with Dr. Gwenlyn Found   Signed, Sanda Klein, MD  07/19/2020 8:44 AM    [address] [place], [place], [place]  [address] Phone: [phone]; Fax: [phone]

## 2020-08-07 ENCOUNTER — Ambulatory Visit: Payer: Medicare HMO | Admitting: Cardiovascular Disease

## 2020-08-07 ENCOUNTER — Other Ambulatory Visit: Payer: Self-pay

## 2020-08-07 ENCOUNTER — Encounter: Payer: Self-pay | Admitting: Cardiovascular Disease

## 2020-08-07 VITALS — BP 166/90 | HR 61 | Ht 64.0 in | Wt 145.6 lb

## 2020-08-07 DIAGNOSIS — I701 Atherosclerosis of renal artery: Secondary | ICD-10-CM | POA: Diagnosis not present

## 2020-08-07 NOTE — Progress Notes (Signed)
[name] returns today for follow-up of her renal Doppler study performed 07/05/2020.  She did have a CTA of her chest performed 12/20/2019 for pain in her chest and abdomen that showed an incidentally patent right renal artery stenosis.  When I saw her on 06/26/2020 her blood pressure was under good control.  Her renal function did however deteriorate from a serum creatinine in the mid to high 1 range up to 1.61 on 06/11/2020 and has since declined to 1.25.  Her Doppler study showed an occluded right renal artery with small right renal pole-to-pole dimension of 7.3 cm suggesting that kidney is no longer functional.  I have reviewed this with Dr. Sallyanne Kuster.  There is no indication for intervention.  We will need to be very careful with nephrotoxic agents such as contrast in the future.  Lorretta Harp, M.D., [place], [facility], [place] [address] [place],   [address]  [phone] 08/07/2020 9:03 AM

## 2020-08-07 NOTE — Patient Instructions (Addendum)
Medication Instructions:  Your physician recommends that you continue on your current medications as directed. Please refer to the Current Medication list given to you today.  *If you need a refill on your cardiac medications before your next appointment, please call your pharmacy*  Lab Work: NONE ordered at this time of appointment   If you have labs (blood work) drawn today and your tests are completely normal, you will receive your results only by: [place] (if you have MyChart) OR A paper copy in the mail If you have any lab test that is abnormal or we need to change your treatment, we will call you to review the results.  Testing/Procedures: Your physician has requested that you have a renal artery duplex. During this test, an ultrasound is used to evaluate blood flow to the kidneys. Allow one hour for this exam. Do not eat after midnight the day before and avoid carbonated beverages. Take your medications as you usually do.  Please schedule for 1 year    Follow-Up: At [facility], you and your health needs are our priority.  As part of our continuing mission to provide you with exceptional heart care, we have created designated Provider Care Teams.  These Care Teams include your primary Cardiologist (physician) and Advanced Practice Providers (APPs -  Physician Assistants and Nurse Practitioners) who all work together to provide you with the care you need, when you need it.  Your next appointment:   1 year(s)  The format for your next appointment:   In Person  Provider:   Quay Burow, MD  Other Instructions

## 2020-08-13 ENCOUNTER — Ambulatory Visit (HOSPITAL_COMMUNITY): Payer: Medicare HMO | Attending: Cardiology

## 2020-08-13 ENCOUNTER — Encounter (HOSPITAL_COMMUNITY): Payer: Self-pay

## 2020-08-13 NOTE — Progress Notes (Signed)
Verified appointment "no show" status with AEdsel Petrin at 14:48.

## 2020-08-23 ENCOUNTER — Telehealth: Payer: Self-pay

## 2020-08-23 NOTE — Telephone Encounter (Signed)
  Care Management  Care Management Phone Note  08/23/2020 Name: [name] MRN: [id number] DOB: 11-25-1940 [name] is a 80 y.o. year old female who is a primary care patient of Sagardia, Ines Bloomer, MD.   The Care Management team was consulted to assess patient's needs after disconnection with services provided by [facility].  Successful outreach was made by telephone today to assess patient care needs post discontinuation of [facility] Services.   Patient reports she is doing well and declines services at this time. Reminded patient to ask MD if she needs help in the future.  Patient confirmed receipt of in home care service discontinuation letter: Yes  Plan: The patient denied current care management/care coordination needs and declined to enroll in care management/care coordination services. Please use [id number] referral order to refer the patient to the embedded care coordination team if the patient is in need of care management/care coordination needs in the future.    Tomasa Rand, RN, BSN, CEN [facility] [employer] [phone]

## 2020-09-11 ENCOUNTER — Other Ambulatory Visit (HOSPITAL_COMMUNITY): Payer: Medicare HMO

## 2020-09-11 ENCOUNTER — Encounter (HOSPITAL_COMMUNITY): Payer: Self-pay | Admitting: Cardiovascular Disease

## 2020-09-16 ENCOUNTER — Telehealth: Payer: Self-pay | Admitting: Cardiovascular Disease

## 2020-09-16 NOTE — Telephone Encounter (Signed)
Will route to MD to get okay to send referral.  Thanks!

## 2020-09-16 NOTE — Telephone Encounter (Signed)
Pt is calling in regards to Dr. Gwenlyn Found sending a referral to :                                                           Dr. Donetta Potts  [facility] [address] [place], [place] [phone] phone Pt has Renal Stenosis

## 2020-09-18 ENCOUNTER — Other Ambulatory Visit: Payer: Self-pay

## 2020-09-18 DIAGNOSIS — I701 Atherosclerosis of renal artery: Secondary | ICD-10-CM

## 2020-09-18 NOTE — Telephone Encounter (Signed)
Referral sent over

## 2020-09-24 ENCOUNTER — Other Ambulatory Visit: Payer: Self-pay

## 2020-09-24 ENCOUNTER — Ambulatory Visit (HOSPITAL_COMMUNITY): Payer: Medicare HMO | Attending: Cardiovascular Disease

## 2020-09-24 DIAGNOSIS — R072 Precordial pain: Secondary | ICD-10-CM | POA: Diagnosis not present

## 2020-09-24 LAB — ECHOCARDIOGRAM COMPLETE
Area-P 1/2: 4.24 cm2
P 1/2 time: 355 msec
S' Lateral: 3.87 cm

## 2020-09-25 DIAGNOSIS — H2511 Age-related nuclear cataract, right eye: Secondary | ICD-10-CM | POA: Diagnosis not present

## 2020-09-25 DIAGNOSIS — H04123 Dry eye syndrome of bilateral lacrimal glands: Secondary | ICD-10-CM | POA: Diagnosis not present

## 2020-10-02 ENCOUNTER — Ambulatory Visit: Payer: Medicare HMO | Admitting: Cardiovascular Disease

## 2020-10-02 ENCOUNTER — Other Ambulatory Visit: Payer: Self-pay

## 2020-10-02 ENCOUNTER — Encounter: Payer: Self-pay | Admitting: Cardiovascular Disease

## 2020-10-02 ENCOUNTER — Ambulatory Visit (INDEPENDENT_AMBULATORY_CARE_PROVIDER_SITE_OTHER): Payer: Medicare HMO | Admitting: Cardiovascular Disease

## 2020-10-02 VITALS — BP 156/90 | HR 89 | Ht 64.0 in | Wt 144.0 lb

## 2020-10-02 DIAGNOSIS — I480 Paroxysmal atrial fibrillation: Secondary | ICD-10-CM | POA: Diagnosis not present

## 2020-10-02 DIAGNOSIS — I25708 Atherosclerosis of coronary artery bypass graft(s), unspecified, with other forms of angina pectoris: Secondary | ICD-10-CM

## 2020-10-02 DIAGNOSIS — I5042 Chronic combined systolic (congestive) and diastolic (congestive) heart failure: Secondary | ICD-10-CM | POA: Diagnosis not present

## 2020-10-02 DIAGNOSIS — I495 Sick sinus syndrome: Secondary | ICD-10-CM | POA: Diagnosis not present

## 2020-10-02 DIAGNOSIS — Z5181 Encounter for therapeutic drug level monitoring: Secondary | ICD-10-CM | POA: Diagnosis not present

## 2020-10-02 DIAGNOSIS — I739 Peripheral vascular disease, unspecified: Secondary | ICD-10-CM

## 2020-10-02 DIAGNOSIS — Z950 Presence of cardiac pacemaker: Secondary | ICD-10-CM

## 2020-10-02 DIAGNOSIS — E213 Hyperparathyroidism, unspecified: Secondary | ICD-10-CM

## 2020-10-02 DIAGNOSIS — I700 Atherosclerosis of aorta: Secondary | ICD-10-CM | POA: Diagnosis not present

## 2020-10-02 DIAGNOSIS — I361 Nonrheumatic tricuspid (valve) insufficiency: Secondary | ICD-10-CM

## 2020-10-02 DIAGNOSIS — Z79899 Other long term (current) drug therapy: Secondary | ICD-10-CM

## 2020-10-02 DIAGNOSIS — I701 Atherosclerosis of renal artery: Secondary | ICD-10-CM | POA: Diagnosis not present

## 2020-10-02 DIAGNOSIS — I679 Cerebrovascular disease, unspecified: Secondary | ICD-10-CM | POA: Diagnosis not present

## 2020-10-02 DIAGNOSIS — E7800 Pure hypercholesterolemia, unspecified: Secondary | ICD-10-CM

## 2020-10-02 DIAGNOSIS — R072 Precordial pain: Secondary | ICD-10-CM

## 2020-10-02 MED ORDER — ROSUVASTATIN CALCIUM 20 MG PO TABS: 20.0000 mg | ORAL_TABLET | Freq: Every day | ORAL | 3 refills | Status: DC

## 2020-10-02 MED ORDER — ISOSORBIDE MONONITRATE ER 30 MG PO TB24: 30.0000 mg | ORAL_TABLET | Freq: Every day | ORAL | 3 refills | Status: DC

## 2020-10-02 MED ORDER — LORAZEPAM 1 MG PO TABS: 1.0000 mg | ORAL_TABLET | Freq: Once | ORAL | 0 refills | Status: AC

## 2020-10-02 NOTE — Progress Notes (Signed)
Patient ID: [name], female   DOB: May 18, 1940, 80 y.o.   MRN: [id number]     Cardiology Office Note    Date:  10/02/2020   ID:  Damonique, [place] [birth year], MRN [id number]  PCP:  Horald Pollen, MD  Cardiologist:   Sanda Klein, MD   Chief Complaint  Patient presents with   Shortness of Breath    F/U abnormal echocardiogram     History of Present Illness:  [name] is a 80 y.o. female with coronary artery disease s/p bypass surgery (November 2016) and placement of drug-eluting stents (DES to distal SVG-RCA, April 2017 ) and left atrial appendage clipping, symptomatic paroxysmal atrial fibrillation and atrial flutter with radiofrequency ablation (cavotricuspid isthmus and pulmonary vein isolation), PAD with previous lower extremity stents, extensive intracranial atherosclerotic disease, history of GI bleeding while on anticoagulation, volatile hypertension.  She has been having problems with off and on chest discomfort and worsening dyspnea on exertion for the last few months.  Some of these episodes have been reminiscent of her previous angina pectoris, but none of them were particularly severe or required emergency evaluation.  The association of her symptoms with physical activity has been inconsistent.  She has not been recently troubled by palpitations and has not experienced syncope or lower extremity edema.  She denies orthopnea and PND.  She has not had any focal neurological events.  An echocardiogram that we performed a few days ago showed a new inferior wall motion abnormality and a reduction in LV ejection fraction to 35-40%, as well as evidence of elevated mean left atrial pressure.  Previous assessment by echo in 08/10/2019 showed normal LV wall motion and ejection fraction, although with evidence of diastolic dysfunction.  Also in the last few months she has apparently lost function in her right kidney, which is atrophic.  The sudden worsening in  her creatinine (from a baseline of around 0.8-1.1 up to 1.6) led to the ultrasound performed on 07/05/2020, that showed absent flow through the right renal artery (which had previously been described as stenotic on the CT angiogram from 04/22/2020 performed at [place]).  The kidney was also atrophic with a maximum dimension of 7.3 cm.  Fortunately her renal function showed an improving trend after this and her most recent creatinine performed in May was down to 1.25, corresponding to a GFR of about 45.  She recovered well from COVID-19 infection in August 2021.  She was hospitalized with abdominal pain at [facility] on April 22, 2020.  Her work-up included an abdominal CT that showed a relatively smaller size right kidney and suspicion for high-grade right renal artery stenosis.  Her creatinine was 1.1 at that time.  Incidental note was also made of a 1.6 cm right adrenal mass and prominent atherosclerosis in the infrarenal abdominal aorta. There was evidence of colonic distention and constipation which was felt to be the cause of her symptoms.  Pacemaker interrogation continues to show normal device function.  Her estimated generator longevity is about 4 years.  She has virtually 100% atrial paced, ventricular sensed rhythm and is not pacemaker dependent.  The underlying rhythm is sinus bradycardia in the 40s.  She has had an occasional episode of brief nonsustained atrial tachycardia but has not had any atrial fibrillation.  She is compliant with dofetilide therapy.  We have decreased the dose of dofetilide to 125 mcg twice daily based on renal function and prolongation of her  QT interval.  QT interval today is in appropriate range at 496 ms.  She has a history of hyperkalemia with ACE inhibitors in the past.  Amiodarone caused hypothyroidism and alopecia. She has tried taking statins but has repeatedly stopped them due to musculoskeletal problems.  She seems to  tolerate the current 10 mg dose of rosuvastatin.  Have not rechecked her LDL cholesterol since last November 2020 when it was 140 without any therapy.  In late August 2020 she underwent an abdominal aortic angiogram with runoff, followed by angioplasty and drug coated stenting of a chronic total occlusion of the right superficial femoral artery. Preprocedure ABI was 0.82  Post procedure ABI was 1.1 at follow-up on October 18 2018, 1.0 in March 2021 (bilaterally).  By ultrasound performed in July 2021, known occluded mid right SFA stent with reconstitution above the level of the popliteal artery and three-vessel runoff, 80% stenosis in the proximal left SFA with widely patent mid left SFA stent.  She has normal left ventricular systolic function. She does have evidence of grade 2 diastolic dysfunction on previous echo. At the time of her bypass surgery she received an Atricure left atrial appendage clip. She is also on clopidogrel following her drug-eluting stents in April 2017. The pacemaker was implanted in 2013 for symptomatic sinus bradycardia. She also has a history of peripheral arterial disease and received bilateral superficial femoral artery stents in 2016, repeat revascularization with drug-coated stent to a totally occluded right superficial femoral artery in 2020. She has never smoked but drinks daily. She has treated hypertension.  After undergoing bypass surgery in November 2016 she returned with non-ST segment elevation myocardial infarction in April 2017 and required placement of stents both in the native right coronary artery (3.020 mm Synergy DES) and in the saphenous vein graft to the distal right coronary artery (2.7538 mm Synergy DES) due to early graft dysfunction. The saphenous vein graft to the ramus intermedius was also occluded, but this vessel was left for medical therapy.    Echocardiogram in August 2022 showed abrupt reduction in LV function to an EF of 35-40% due to a new  inferior wall motion abnormality.  Past Medical History:  Diagnosis Date   ANXIETY 12/31/2009   Aortic insufficiency    a. mod by echo 2017.   CAD (coronary artery disease)    a. s/p CABGx3 and LAA clipping in 12/2014, NSTEMI 05/2015 s/p DES to native RCA and SVG-dRCA; occluded ramus-SVG was treated medically). c. neg nuc 10/2015 at [facility].   Chronic diastolic CHF (congestive heart failure) (HCC)    Chronic fatigue    CKD (chronic kidney disease), stage II    DEPRESSION 12/31/2009   [place] DISEASE, CERVICAL 12/31/2009   [place] DISEASE, LUMBAR 12/31/2009   DIVERTICULITIS, HX OF 12/31/2009   Essential hypertension    Gait abnormality 03/25/2017   GLUCOSE INTOLERANCE 12/31/2009   Habitual alcohol use    History of stroke    self reports " they say i have had some pin strokes"    Hypercalcemia    Hyperkalemia    Hyperlipidemia 12/31/2009   Hypothyroidism    MENOPAUSE, EARLY 12/31/2009   NSTEMI (non-ST elevated myocardial infarction) ([place]) 05/11/2015   Orthostatic hypotension    OSTEOARTHRITIS, HAND    Pacemaker 01/21/2012   MDT Adapta dual chamber   Paroxysmal atrial flutter (HCC)    Persistent atrial fibrillation (HCC)    PVD (peripheral vascular disease), hx stents to bil SFAs 02/2010    Rash, skin  superficial raised red pencil point sized rash bilateral forearms; states " it started when i started the Plavix "    Ruptured left breast implant    Symptomatic sinus bradycardia 01/22/2012   Syncope    a. 11/2015 ? due to medications.   Syncope    reports on 05-10-17 " i passed out 2 months ago in the bathroom and broke some ribs"     Tachy-brady syndrome ([place])    a. s/p MDT PPM 2013.   Tricuspid regurgitation     Past Surgical History:  Procedure Laterality Date   ABDOMINAL AORTOGRAM W/LOWER EXTREMITY Bilateral 02/15/2017   Procedure: ABDOMINAL AORTOGRAM W/LOWER EXTREMITY;  Surgeon: Lorretta Harp, MD;  Location: [place] [facility];  Service: Cardiovascular;  Laterality:  Bilateral;  bilat   ABDOMINAL AORTOGRAM W/LOWER EXTREMITY N/A 10/06/2018   Procedure: ABDOMINAL AORTOGRAM W/LOWER EXTREMITY;  Surgeon: Lorretta Harp, MD;  Location: [place] [facility];  Service: Cardiovascular;  Laterality: N/A;   Ablation of typical atrial flutter (CTI line)  09/05/2016   Dr Antonieta Pert at [place]   Removed 01/2018   BREAST IMPLANT REMOVAL Bilateral 01/14/2018   Procedure: REMOVAL BILATERAL BREAST IMPLANTS;  Surgeon: Irene Limbo, MD;  Location: [place];  Service: Plastics;  Laterality: Bilateral;   CAPSULECTOMY Bilateral 01/14/2018   Procedure: CAPSULECTOMY;  Surgeon: Irene Limbo, MD;  Location: [place];  Service: Plastics;  Laterality: Bilateral;   CARDIAC CATHETERIZATION N/A 01/01/2015   Procedure: Left Heart Cath and Coronary Angiography;  Surgeon: Jettie Booze, MD;  Location: [place] [facility];  Service: Cardiovascular;  Laterality: N/A;   CARDIAC CATHETERIZATION N/A 05/12/2015   Procedure: Left Heart Cath and Coronary Angiography;  Surgeon: Troy Sine, MD;  Location: [place] [facility];  Service: Cardiovascular;  Laterality: N/A;   CARDIAC CATHETERIZATION N/A 05/12/2015   Procedure: Coronary Stent Intervention;  Surgeon: Troy Sine, MD;  Location: [place] [facility];  Service: Cardiovascular;  Laterality: N/A;   CARDIOVERSION N/A 02/01/2015   Procedure: CARDIOVERSION;  Surgeon: Lelon Perla, MD;  Location: [facility] ENDOSCOPY;  Service: Cardiovascular;  Laterality: N/A;   CARDIOVERSION N/A 08/30/2015   Procedure: CARDIOVERSION;  Surgeon: Sanda Klein, MD;  Location: [place] ENDOSCOPY;  Service: Cardiovascular;  Laterality: N/A;   CARPAL TUNNEL RELEASE  2008   "right hand/thumb; carpal tunnel repair; got rid of arthritis" (01/21/2012)   CLIPPING OF ATRIAL APPENDAGE N/A 01/04/2015   Procedure: CLIPPING OF ATRIAL APPENDAGE;  Surgeon: Grace Isaac, MD;  Location: [place];  Service: Open  Heart Surgery;  Laterality: N/A;   COLONOSCOPY N/A 12/21/2015   Procedure: COLONOSCOPY;  Surgeon: Milus Banister, MD;  Location: [facility] ENDOSCOPY;  Service: Endoscopy;  Laterality: N/A;   CORONARY ARTERY BYPASS GRAFT N/A 01/04/2015   Procedure: CORONARY ARTERY BYPASS GRAFTING (CABG) x 3 using left internal mammory artery and greater saphenous vein right leg harvested endoscopically.;  Surgeon: Grace Isaac, MD;  LIMA-LAD, SVG-RI, SVG-PDA   ESOPHAGOGASTRODUODENOSCOPY (EGD) WITH PROPOFOL N/A 12/18/2015   Procedure: ESOPHAGOGASTRODUODENOSCOPY (EGD) WITH PROPOFOL;  Surgeon: Milus Banister, MD;  Location: [facility];  Service: Endoscopy;  Laterality: N/A;   FACELIFT, LOWER 2/3  1995   "mini" (01/21/2012)   GIVENS CAPSULE STUDY N/A 12/21/2015   Procedure: GIVENS CAPSULE STUDY;  Surgeon: Milus Banister, MD;  Location: [facility];  Service: Endoscopy;  Laterality: N/A;   Lower Arterial Examination  10/28/2011   R. SFA stent  mild-moderate mixed density plaque with elevated velocities consistent with 50% diameter reduction. L. SFA stent moderate mixed denisty plaque at mid to distal level consistent with 50-69% diameter reduction.   LOWER EXTREMITY ANGIOGRAPHY N/A 02/15/2017   Procedure: LOWER EXTREMITY ANGIOGRAPHY;  Surgeon: Lorretta Harp, MD;  Location: [place] [facility];  Service: Cardiovascular;  Laterality: N/A;   OOPHORECTOMY  ~1979   PARATHYROIDECTOMY N/A 05/13/2017   Procedure: PARATHYROIDECTOMY;  Surgeon: Armandina Gemma, MD;  Location: [facility];  Service: General;  Laterality: N/A;   PARTIAL COLECTOMY  2010   PERIPHERAL ARTERIAL STENT GRAFT  2012; 2012   "LLE; RLE" (01/21/2012)   PERIPHERAL VASCULAR BALLOON ANGIOPLASTY Right 02/15/2017   Procedure: PERIPHERAL VASCULAR BALLOON ANGIOPLASTY;  Surgeon: Lorretta Harp, MD;  Location: [place] [facility];  Service: Cardiovascular;  Laterality: Right;  SFA     PERIPHERAL VASCULAR INTERVENTION Right 10/06/2018   Procedure: PERIPHERAL VASCULAR  INTERVENTION;  Surgeon: Lorretta Harp, MD;  Location: [place] [facility];  Service: Cardiovascular;  Laterality: Right;   PERMANENT PACEMAKER INSERTION N/A 01/21/2012   Medtronic Adapta L implanted by Dr Sallyanne Kuster for tachy/brady syndrome   POSTERIOR CERVICAL LAMINECTOMY  1985   TEE WITHOUT CARDIOVERSION N/A 01/04/2015   Procedure: TRANSESOPHAGEAL ECHOCARDIOGRAM (TEE);  Surgeon: Grace Isaac, MD;  Location: [place];  Service: Open Heart Surgery;  Laterality: N/A;   TEE WITHOUT CARDIOVERSION N/A 02/01/2015   Procedure: TRANSESOPHAGEAL ECHOCARDIOGRAM (TEE);  Surgeon: Lelon Perla, MD;  Location: [facility] ENDOSCOPY;  Service: Cardiovascular;  Laterality: N/A;   TEE WITHOUT CARDIOVERSION N/A 08/30/2015   Procedure: TRANSESOPHAGEAL ECHOCARDIOGRAM (TEE);  Surgeon: Sanda Klein, MD;  Location: [facility] ENDOSCOPY;  Service: Cardiovascular;  Laterality: N/A;   VAGINAL HYSTERECTOMY  1975    Current Medications: Outpatient Medications Prior to Visit  Medication Sig Dispense Refill   amLODipine (NORVASC) 5 MG tablet Take 1 tablet (5 mg total) by mouth in the morning and at bedtime. 180 tablet 2   aspirin EC 81 MG tablet Take 81 mg by mouth daily.      Biotin 10000 MCG TABS Take 10,000 mcg by mouth daily.     carvedilol (COREG) 6.25 MG tablet Take 1 tablet (6.25 mg total) by mouth 2 (two) times daily with a meal. 60 tablet 5   Cholecalciferol (VITAMIN D3 PO) Take 1 tablet by mouth daily.     citalopram (CELEXA) 40 MG tablet Take 1 tablet (40 mg total) by mouth daily. 90 tablet 1   clopidogrel (PLAVIX) 75 MG tablet TAKE 1 TABLET (75 MG TOTAL) BY MOUTH DAILY WITH BREAKFAST. 90 tablet 3   dofetilide (TIKOSYN) 125 MCG capsule TAKE 1 CAPSULE  BY MOUTH EVERY 12 HOURS. (GENERIC FOR TIKOSYN) 180 capsule 2   furosemide (LASIX) 40 MG tablet Take 1 tablet (40 mg total) by mouth every other day. 20 tablet 2   levothyroxine (SYNTHROID, LEVOTHROID) 25 MCG tablet Take 25 mcg by mouth daily before breakfast.       Magnesium 200 MG TABS Take 200 mg by mouth daily. Patient takes 1 tablet daily     Multiple Vitamins-Minerals (MULTI-VITE) LIQD Take 1 tablet by mouth daily.      nitroGLYCERIN (NITROSTAT) 0.4 MG SL tablet Place 1 tablet (0.4 mg total) under the tongue every 5 (five) minutes x 3 doses as needed for chest pain. 25 tablet 2   Olive Leaf Extract 250 MG CAPS Take 250 mg by mouth 2 (two) times daily.     vitamin C (ASCORBIC ACID)  500 MG tablet Take 500 mg by mouth daily.     rosuvastatin (CRESTOR) 10 MG tablet Take 1 tablet (10 mg total) by mouth daily. 90 tablet 3   alendronate (FOSAMAX) 70 MG tablet Take 70 mg by mouth every Saturday. (Patient not taking: No sig reported)     No facility-administered medications prior to visit.     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  Retired.   Social Determinants of Health   Financial Resource Strain: Not on file  Food Insecurity: Not on file  Transportation Needs: Not on file  Physical Activity: Not on file  Stress: Not on file  Social Connections: Not  on file     Family History:  The patient's family history includes CVA in her brother; Heart attack in her brother; Heart disease in her brother, father, and mother; Hypertension in her brother and mother; Stroke in her father and mother.   ROS:   Please see the history of present illness.   All other systems are reviewed and are negative.   PHYSICAL EXAM:   VS:  BP (!) 156/90   Pulse 89   Ht '5\' 4"'$  (1.626 m)   Wt 144 lb (65.3 kg)   SpO2 95%   BMI 24.72 kg/m     Wt Readings from Last 3 Encounters:  10/02/20 144 lb (65.3 kg)  08/07/20 145 lb 9.6 oz (66 kg)  07/19/20 146 lb (66.2 kg)      General: Alert, oriented x3, no distress, well-healed pacemaker site.  Left subclavian area. Head: no evidence of trauma, PERRL, EOMI, no exophtalmos or lid lag, no myxedema, no xanthelasma; normal ears, nose and oropharynx Neck: normal jugular venous pulsations and no hepatojugular reflux; brisk carotid pulses without delay and no carotid bruits Chest: clear to auscultation, no signs of consolidation by percussion or palpation, normal fremitus, symmetrical and full respiratory excursions Cardiovascular: normal position and quality of the apical impulse, regular rhythm, normal first and second heart sounds, no murmurs, rubs or gallops Abdomen: no tenderness or distention, no masses by palpation, no abnormal pulsatility or arterial bruits, normal bowel sounds, no hepatosplenomegaly Extremities: no clubbing, cyanosis or edema; 2+ radial, ulnar and brachial pulses bilaterally; 2+ right femoral, posterior tibial and dorsalis pedis pulses; 2+ left femoral, posterior tibial and dorsalis pedis pulses; no subclavian or femoral bruits Neurological: grossly nonfocal Psych: Normal mood and affect'   Studies/Labs Reviewed:  ECHO 09/24/2020:   1. Compared  with the echo [id number], systolic function has worsened. Left ventricular ejection fraction, by estimation, is 35 to 40%. The left ventricle has moderately  decreased function. The left ventricle demonstrates global hypokinesis. There is mild concentric left ventricular hypertrophy. Left ventricular diastolic parameters are consistent with Grade II diastolic dysfunction (pseudonormalization). Elevated left ventricular end-diastolic pressure.   2. Right ventricular systolic function is normal. The right ventricular size is normal. There is moderately elevated pulmonary artery systolic pressure.   3. Left atrial size was moderately dilated.   4. The mitral valve is normal in structure. Mild mitral valve regurgitation. No evidence of mitral stenosis.   5. Tricuspid valve regurgitation is moderate to severe.   6. The aortic valve is tricuspid. There is mild calcification of the  aortic valve. There is mild thickening of the aortic valve. Aortic valve  regurgitation is mild to moderate. No aortic stenosis is present.   7. The inferior vena cava is normal in size with greater than 50%  respiratory variability, suggesting right atrial pressure of 3 mmHg.   EKG:  EKG is ordered today.  Personally reviewed shows atrial paced, ventricular sensed rhythm with fairly mild lateral ST segment depression and biphasic T waves, QTC 496 ms.  There were no Q waves in the inferior leads.   Recent Labs: 10/15/2019: Magnesium 2.1 12/20/2019: Hemoglobin 14.3; Platelets 301 06/11/2020: ALT 13 07/05/2020: BUN 22; Creatinine, Ser 1.25; Potassium 5.1; Sodium 136   Lipid Panel    Component Value Date/Time   CHOL 202 (H) 06/11/2020 0952   TRIG 177 (H) 06/11/2020 0952   HDL 89 06/11/2020 0952   CHOLHDL 2.3 06/11/2020 0952   CHOLHDL 2.5 05/12/2015 0324   VLDL 23 05/12/2015 0324   LDLCALC 84 06/11/2020 0952    ASSESSMENT:    1. Coronary artery disease of bypass graft of native heart with stable angina pectoris ([place])   2. PAF (paroxysmal atrial fibrillation) ([place])   3. Encounter for monitoring dofetilide therapy   4. SSS (sick sinus syndrome) ([place])   5. Pacemaker   6.  Chronic combined systolic and diastolic heart failure ([place])   7. PAD (peripheral artery disease) ([place])   8. Renal artery stenosis ([place])   9. Aortic atherosclerosis ([place])   10. Cerebrovascular small vessel disease   11. Hyperparathyroidism ([place])   12. Hypercholesterolemia   13. Nonrheumatic tricuspid valve regurgitation   14. Precordial pain       PLAN:  In order of problems listed above:  CAD: She has been having symptoms suggestive of angina pectoris with a somewhat positional pattern for the last couple of months.  I suspected that she may have occluded her saphenous vein graft to the right coronary artery a couple of months ago.  She continues to have chest discomfort and there are no Q waves in inferior wall, suggesting that this area of myocardium may be viable/hibernating.  Revascularization would be extremely challenging.  She has a stent in the proximal right coronary artery, but the mid right coronary artery is occluded.  The saphenous vein graft had very early onset of deterioration requiring proximal stents only 3 months after the bypass surgery and now is probably totally occluded in its proximal portion.  Percutaneous revascularization would be both risky and possibly of low yield.  Would like to first assess for myocardial viability in the inferior wall.  MRI with gadolinium contrast would be the best study for this.  In the meantime we will add isosorbide mononitrate 30 mg once daily.  Continue carvedilol and amlodipine. AFib: None has been detected in the last 12 months.  She has had clipping over left atrial appendage and is not on anticoagulation.  She does take aspirin and clopidogrel for PAD with stents in both lower extremities.   Dofetilide: Current dose of dofetilide appears appropriate with QTC in desirable range.  Excellent clinical response with marked reduction in the burden of atrial fibrillation. SSS: Virtually 100% atrial paced, ventricular sensed, with the atrial  pacing rate set to relatively high to help prevent recurrent atrial fibrillation and improve exertional symptoms.  She is not pacemaker dependent.  The underlying sinus rate is about 45-50 bpm. PPM:  Checked today, normal function. She has 5076 atrial and ventricular leads and a Medtronic Adapta generator.  She is not pacemaker dependent.  Although her Medtronic Adapta generator is not technically an MRI conditional device, I think we can safely perform an MRI, with the appropriate precautions.  Information about myocardial viability would be invaluable in planning further treatment.  Discussed this with Dr. Gardiner Rhyme today. CHF: Her echocardiogram showed evidence of elevated mean left atrial pressure and she has exertional dyspnea, but has no overt findings of hypervolemia on physical exam.  Before we adjust her diuretics, would like to reassess renal function and have her undergo the MRI.   She has a tendency to develop hyperkalemia, especially when on RAAS inhibitors.  In the past she had a relatively low blood pressure and we discontinue the tiny dose of valsartan that she was taking.  May need to restart this with the new reduction in LVEF, but this should also wait until after her MRI and reassessment of renal function. PAD: Denies claudication.  Improved symptoms following her last revascularization procedure in August 2021.  Despite restenosis on one side, she has ABI 1.0 bilaterally and does not have intermittent claudication.  On dual antiplatelet therapy.  Also noted that she has lower blood pressure in the left arm suggesting left subclavian stenosis.  Right renal artery occlusion: Unfortunately, she has completely lost flow to her right kidney and this was associated with a fairly abrupt worsening of renal function from a baseline creatinine of 0.8-1.0 up to 1.6, with subsequent improvement down to 1.25 on the most recent labs performed in late May.  Recheck renal function before her MRI. ASCVD: No  stenoses in the extracranial carotid arteries.  She has mostly small vessel disease and the plan is for her to remain on clopidogrel indefinitely.  Avoid hypotension.  Allow SBP around 140 (that is why we stopped her valsartan) . HTN: Blood pressure has been relatively low recently.  We will discontinue the valsartan.  Blood pressure in the desirable range today (note that she has lower blood pressure in the left arm and should always check blood pressure on the right). Avoid ACE inhibitors due to history of hyperkalemia while taking lisinopril. 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  Normal calcium levels by most recent labs performed November 2021. Hypercholesterolemia: Previously reported intolerance to atorvastatin and to rosuvastatin (muscle aches and hair loss).  Seems to be tolerating rosuvastatin 10 mg once daily very well now.  LDL was not at target (less than 70).  We will try to increase this to 20 mg daily.  If she does not tolerate this would switch to /add Repatha.  She does have an excellent HDL at 89, but has also had progression of disease with recent occlusion of the right renal artery and probable loss  of the SVG-RCA. TR: moderate to severe on TEE 2017 before SVG-RCA stent, mild in 2018, moderate 2021, now moderate-to-severe. Partly pacemaker lead related, probably worse due to RV ischemia.    Medication Adjustments/Labs and Tests Ordered: Current medicines are reviewed at length with the patient today.  Concerns regarding medicines are outlined above.  Medication changes, Labs and Tests ordered today are listed in the Patient Instructions below. Patient Instructions  Medication Instructions:   START TAKING isosorbide MONO  30 MG ONE TABLET DAILY  INCREASE ROSUVASTATIN TO 20 MG ONE TABLET DAILY   *If you need a refill on your cardiac medications before your next appointment, please call your pharmacy*   Lab Work: BMP-TODAY  If you have  labs (blood work) drawn today and your tests are completely normal, you will receive your results only by: [place] (if you have MyChart) OR A paper copy in the mail If you have any lab test that is abnormal or we need to change your treatment, we will call you to review the results.   Testing/Procedures: .will be schedule at [place] has requested that you have a cardiac MRI. Cardiac MRI uses a computer to create images of your heart as its beating, producing both still and moving pictures of your heart and major blood vessels.  Please follow the instruction sheet given to you today for more information.    Follow-Up: At [facility], you and your health needs are our priority.  As part of our continuing mission to provide you with exceptional heart care, we have created designated Provider Care Teams.  These Care Teams include your primary Cardiologist (physician) and Advanced Practice Providers (APPs -  Physician Assistants and Nurse Practitioners) who all work together to provide you with the care you need, when you need it.     Your next appointment:   3 month(s)  The format for your next appointment:   In Person  Provider:   Sanda Klein, MD   Other Instructions    Signed, Sanda Klein, MD  10/02/2020 12:48 PM    [address], [place], [place]  [address] Phone: [phone]; Fax: [phone]

## 2020-10-02 NOTE — Patient Instructions (Addendum)
Medication Instructions:   START TAKING isosorbide MONO  30 MG ONE TABLET DAILY  INCREASE ROSUVASTATIN TO 20 MG ONE TABLET DAILY   *If you need a refill on your cardiac medications before your next appointment, please call your pharmacy*   Lab Work: BMP-TODAY  If you have labs (blood work) drawn today and your tests are completely normal, you will receive your results only by: [place] (if you have MyChart) OR A paper copy in the mail If you have any lab test that is abnormal or we need to change your treatment, we will call you to review the results.   Testing/Procedures: .will be schedule at [place] has requested that you have a cardiac MRI. Cardiac MRI uses a computer to create images of your heart as its beating, producing both still and moving pictures of your heart and major blood vessels.  Please follow the instruction sheet given to you today for more information.    Follow-Up: At [facility], you and your health needs are our priority.  As part of our continuing mission to provide you with exceptional heart care, we have created designated Provider Care Teams.  These Care Teams include your primary Cardiologist (physician) and Advanced Practice Providers (APPs -  Physician Assistants and Nurse Practitioners) who all work together to provide you with the care you need, when you need it.     Your next appointment:   3 month(s)  The format for your next appointment:   In Person  Provider:   Sanda Klein, MD   Other Instructions

## 2020-10-04 DIAGNOSIS — R072 Precordial pain: Secondary | ICD-10-CM | POA: Diagnosis not present

## 2020-10-04 DIAGNOSIS — I25708 Atherosclerosis of coronary artery bypass graft(s), unspecified, with other forms of angina pectoris: Secondary | ICD-10-CM | POA: Diagnosis not present

## 2020-10-04 DIAGNOSIS — I5032 Chronic diastolic (congestive) heart failure: Secondary | ICD-10-CM | POA: Diagnosis not present

## 2020-10-05 LAB — BASIC METABOLIC PANEL
BUN/Creatinine Ratio: 19 (ref 12–28)
BUN: 23 mg/dL (ref 8–27)
CO2: 25 mmol/L (ref 20–29)
Calcium: 10 mg/dL (ref 8.7–10.3)
Chloride: 96 mmol/L (ref 96–106)
Creatinine, Ser: 1.24 mg/dL — ABNORMAL HIGH (ref 0.57–1.00)
Glucose: 87 mg/dL (ref 65–99)
Potassium: 5.7 mmol/L — ABNORMAL HIGH (ref 3.5–5.2)
Sodium: 139 mmol/L (ref 134–144)
eGFR: 44 mL/min/{1.73_m2} — ABNORMAL LOW (ref >59)

## 2020-10-05 LAB — CBC
Hematocrit: 45.3 % (ref 34.0–46.6)
Hemoglobin: 15 g/dL (ref 11.1–15.9)
MCH: 29 pg (ref 26.6–33.0)
MCHC: 33.1 g/dL (ref 31.5–35.7)
MCV: 88 fL (ref 79–97)
Platelets: 307 10*3/uL (ref 150–450)
RBC: 5.18 x10E6/uL (ref 3.77–5.28)
RDW: 13.1 % (ref 11.7–15.4)
WBC: 6.7 10*3/uL (ref 3.4–10.8)

## 2020-10-05 NOTE — Addendum Note (Signed)
Addended by: Sanda Klein on: 10/05/2020 12:38 PM   Modules accepted: Orders

## 2020-10-07 ENCOUNTER — Telehealth: Payer: Self-pay | Admitting: Cardiovascular Disease

## 2020-10-07 DIAGNOSIS — R0602 Shortness of breath: Secondary | ICD-10-CM

## 2020-10-07 DIAGNOSIS — I25708 Atherosclerosis of coronary artery bypass graft(s), unspecified, with other forms of angina pectoris: Secondary | ICD-10-CM

## 2020-10-07 NOTE — Telephone Encounter (Signed)
Called patient back. Patient complaining of worsen SOB and having trouble sleeping at night. Patient was just seen by Dr. Sallyanne Kuster. Patient stated this has been going on for over a year, but it has gotten worse. Will send message to Dr. Sallyanne Kuster for advisement.

## 2020-10-07 NOTE — Telephone Encounter (Signed)
Pt c/o Shortness Of Breath: STAT if SOB developed within the last 24 hours or pt is noticeably SOB on the phone  1. Are you currently SOB (can you hear that pt is SOB on the phone)?  No   2. How long have you been experiencing SOB?  About 1 year on and off   3. Are you SOB when sitting or when up moving around?  Both, but patient states the SOB is worse when she is laying down   4. Are you currently experiencing any other symptoms?  No

## 2020-10-07 NOTE — Telephone Encounter (Signed)
Please increase the furosemide to 80 mg daily.  Check BMET, Mg and BNP in a couple of weeks.  Thanks   Called patient with Dr. Victorino December recommendations. Patient will come in in 2 weeks for lab work. Will place orders for the above.   Patient had questions about Thallium Nuclear Scan that was ordered. Will send message to Dr. Victorino December nurse to follow up.

## 2020-10-09 NOTE — Telephone Encounter (Signed)
See MyChart message

## 2020-10-21 ENCOUNTER — Other Ambulatory Visit: Payer: Self-pay

## 2020-10-21 DIAGNOSIS — I25708 Atherosclerosis of coronary artery bypass graft(s), unspecified, with other forms of angina pectoris: Secondary | ICD-10-CM

## 2020-10-21 DIAGNOSIS — R0602 Shortness of breath: Secondary | ICD-10-CM | POA: Diagnosis not present

## 2020-10-22 LAB — MAGNESIUM: Magnesium: 2.3 mg/dL (ref 1.6–2.3)

## 2020-10-22 LAB — BASIC METABOLIC PANEL
BUN/Creatinine Ratio: 18 (ref 12–28)
BUN: 20 mg/dL (ref 8–27)
CO2: 24 mmol/L (ref 20–29)
Calcium: 9.6 mg/dL (ref 8.7–10.3)
Chloride: 98 mmol/L (ref 96–106)
Creatinine, Ser: 1.14 mg/dL — ABNORMAL HIGH (ref 0.57–1.00)
Glucose: 91 mg/dL (ref 65–99)
Potassium: 4.8 mmol/L (ref 3.5–5.2)
Sodium: 140 mmol/L (ref 134–144)
eGFR: 49 mL/min/{1.73_m2} — ABNORMAL LOW (ref >59)

## 2020-10-22 LAB — PRO B NATRIURETIC PEPTIDE: NT-Pro BNP: 2967 pg/mL — ABNORMAL HIGH (ref 0–738)

## 2020-10-23 ENCOUNTER — Encounter: Payer: Self-pay | Admitting: *Deleted

## 2020-10-23 ENCOUNTER — Other Ambulatory Visit: Payer: Self-pay | Admitting: *Deleted

## 2020-10-23 MED ORDER — FUROSEMIDE 80 MG PO TABS: 80.0000 mg | ORAL_TABLET | Freq: Every day | ORAL | 3 refills | Status: DC

## 2020-10-24 ENCOUNTER — Other Ambulatory Visit: Payer: Self-pay

## 2020-10-24 ENCOUNTER — Ambulatory Visit (HOSPITAL_COMMUNITY): Payer: Medicare HMO

## 2020-10-24 ENCOUNTER — Ambulatory Visit (HOSPITAL_COMMUNITY): Payer: Medicare HMO | Attending: Cardiology

## 2020-10-24 DIAGNOSIS — I5042 Chronic combined systolic (congestive) and diastolic (congestive) heart failure: Secondary | ICD-10-CM | POA: Diagnosis present

## 2020-10-24 DIAGNOSIS — I700 Atherosclerosis of aorta: Secondary | ICD-10-CM | POA: Insufficient documentation

## 2020-10-24 DIAGNOSIS — I25708 Atherosclerosis of coronary artery bypass graft(s), unspecified, with other forms of angina pectoris: Secondary | ICD-10-CM | POA: Diagnosis present

## 2020-10-24 MED ORDER — THALLOUS CHLORIDE TL 201 1 MCI/ML IV SOLN
4.0000 | Freq: Once | INTRAVENOUS | Status: AC | PRN
  Administered 2020-10-24: 4 via INTRAVENOUS

## 2020-10-25 ENCOUNTER — Ambulatory Visit (HOSPITAL_COMMUNITY): Payer: Medicare HMO | Attending: Cardiovascular Disease

## 2020-10-28 LAB — THALLIUM 201 VIABILITY STUDY: Rest Nuclear Isotope Dose: 4 mCi

## 2020-11-01 ENCOUNTER — Encounter: Payer: Self-pay | Admitting: Cardiovascular Disease

## 2020-11-01 ENCOUNTER — Ambulatory Visit: Payer: Medicare HMO | Admitting: Cardiovascular Disease

## 2020-11-01 ENCOUNTER — Other Ambulatory Visit: Payer: Self-pay

## 2020-11-01 VITALS — BP 162/82 | HR 82 | Ht 64.0 in | Wt 145.4 lb

## 2020-11-01 DIAGNOSIS — I25708 Atherosclerosis of coronary artery bypass graft(s), unspecified, with other forms of angina pectoris: Secondary | ICD-10-CM

## 2020-11-01 DIAGNOSIS — I679 Cerebrovascular disease, unspecified: Secondary | ICD-10-CM

## 2020-11-01 DIAGNOSIS — I739 Peripheral vascular disease, unspecified: Secondary | ICD-10-CM

## 2020-11-01 DIAGNOSIS — I495 Sick sinus syndrome: Secondary | ICD-10-CM

## 2020-11-01 DIAGNOSIS — I10 Essential (primary) hypertension: Secondary | ICD-10-CM

## 2020-11-01 DIAGNOSIS — I701 Atherosclerosis of renal artery: Secondary | ICD-10-CM

## 2020-11-01 DIAGNOSIS — Z01818 Encounter for other preprocedural examination: Secondary | ICD-10-CM | POA: Diagnosis not present

## 2020-11-01 DIAGNOSIS — I5042 Chronic combined systolic (congestive) and diastolic (congestive) heart failure: Secondary | ICD-10-CM

## 2020-11-01 DIAGNOSIS — Z5181 Encounter for therapeutic drug level monitoring: Secondary | ICD-10-CM | POA: Diagnosis not present

## 2020-11-01 DIAGNOSIS — I361 Nonrheumatic tricuspid (valve) insufficiency: Secondary | ICD-10-CM

## 2020-11-01 DIAGNOSIS — I480 Paroxysmal atrial fibrillation: Secondary | ICD-10-CM | POA: Diagnosis not present

## 2020-11-01 DIAGNOSIS — Z950 Presence of cardiac pacemaker: Secondary | ICD-10-CM | POA: Diagnosis not present

## 2020-11-01 DIAGNOSIS — E7800 Pure hypercholesterolemia, unspecified: Secondary | ICD-10-CM

## 2020-11-01 DIAGNOSIS — Z79899 Other long term (current) drug therapy: Secondary | ICD-10-CM

## 2020-11-01 NOTE — Patient Instructions (Signed)
Medication Instructions:  No changes  Testing/Procedures: Your physician has requested that you have a cardiac catheterization. Cardiac catheterization is used to diagnose and/or treat various heart conditions. Doctors may recommend this procedure for a number of different reasons. The most common reason is to evaluate chest pain. Chest pain can be a symptom of coronary artery disease (CAD), and cardiac catheterization can show whether plaque is narrowing or blocking your heart's arteries. This procedure is also used to evaluate the valves, as well as measure the blood flow and oxygen levels in different parts of your heart. For further information please visit [email or web address]. Please follow instruction sheet, as given.  Follow-Up: At [facility], you and your health needs are our priority.  As part of our continuing mission to provide you with exceptional heart care, we have created designated Provider Care Teams.  These Care Teams include your primary Cardiologist (physician) and Advanced Practice Providers (APPs -  Physician Assistants and Nurse Practitioners) who all work together to provide you with the care you need, when you need it.  We recommend signing up for the patient portal called "MyChart".  Sign up information is provided on this After Visit Summary.  MyChart is used to connect with patients for Virtual Visits (Telemedicine).  Patients are able to view lab/test results, encounter notes, upcoming appointments, etc.  Non-urgent messages can be sent to your provider as well.   To learn more about what you can do with MyChart, go to [email or web address].    Your next appointment:   3 month(s)  The format for your next appointment:   In Person  Provider:   Sanda Klein, MD   Other Instructions  [place] [address] [place] [address] Dept: [phone] Loc:  [phone]  [name]  11/01/2020  You are scheduled for a Cardiac Catheterization on Thursday, September 29 with Dr. Peter Martinique.  1. Please arrive at the [facility] (Main Entrance A) at [facility]: [address] [place], [place] [address] at 5:30 AM (This time is 4 hours before your procedure to ensure your preparation). Free valet parking service is available.   Special note: Every effort is made to have your procedure done on time. Please understand that emergencies sometimes delay scheduled procedures.  2. Diet: Do not eat solid foods after midnight.  The patient may have clear liquids until 5am upon the day of the procedure.  3. Labs: You will need to have blood drawn on Monday 9/26. You may have this done at our office or any [facility]. You do not need an appointment or need to be fasting. The lab opens at 8 am.  4. Medication instructions in preparation for your procedure: Hold the Furosemide the morning of the procedure  On the morning of your procedure, take your Aspirin and any morning medicines NOT listed above.  You may use sips of water.  5. Plan for one night stay--bring personal belongings. 6. Bring a current list of your medications and current insurance cards. 7. You MUST have a responsible person to drive you home. 8. Someone MUST be with you the first 24 hours after you arrive home or your discharge will be delayed. 9. Please wear clothes that are easy to get on and off and wear slip-on shoes.  Thank you for allowing [place] to care for you!   -- [place] [facility]

## 2020-11-02 NOTE — Progress Notes (Signed)
Patient ID: [name], female   DOB: 01/29/1941, 80 y.o.   MRN: [id number]     Cardiology Office Note    Date:  11/02/2020   ID:  [name], [name] Jun 01, 1940, MRN [id number]  PCP:  Horald Pollen, MD  Cardiologist:   Sanda Klein, MD   Chief Complaint  Patient presents with   Coronary Artery Disease    Follow-up thallium viability stress test     History of Present Illness:  [name] is a 80 y.o. female with coronary artery disease s/p bypass surgery (November 2016) and placement of drug-eluting stents (DES to distal SVG-RCA, April 2017 ) and left atrial appendage clipping, symptomatic paroxysmal atrial fibrillation and atrial flutter with radiofrequency ablation (cavotricuspid isthmus and pulmonary vein isolation), PAD with previous lower extremity stents, extensive intracranial atherosclerotic disease, history of GI bleeding while on anticoagulation, volatile hypertension.  She continues to have problems with exertional chest discomfort and exertional dyspnea for the last several months, NYHA functional class II.  At rest, she does not usually have dyspnea or chest tightness, but does have some lingering vague discomfort at times.  She denies palpitations, syncope or edema, orthopnea or PND.  An echocardiogram that we performed 09/24/2020 showed a new inferior wall motion abnormality and a reduction in LV ejection fraction to 35-40%, as well as evidence of elevated mean left atrial pressure.  Previous assessment by echo in 08/10/2019 showed normal LV wall motion and ejection fraction, although with evidence of diastolic dysfunction.  The ECG does not show Q waves in the inferior leads (does have a chronic QS pattern in leads V1 and V2) and did not show any acute ST-T changes.  Recommended cardiac MRI for viability assessment, but her Medtronic Adapta pacemaker is not officially MRI conditional.  Performed a thallium viability study 10/28/2020 that showed viable  myocardium throughout without any evidence of previous infarction.  About 6 months ago had abrupt worsening of kidney function with increase of creatinine to 1.6, from a baseline of 0.8-1.1.  Renal duplex arterial ultrasound on 07/05/2020 showed absent flow in the right renal artery (previously described as stenotic on CT angiogram 04/22/2020 from the [facility]).  The right kidney was also described as atrophic with a maximum diameter of only 7.3 cm.  Nevertheless, creatinine has steadily improved since March down to 1.25 in May and 1.14 on 10/21/2020  She recovered well from COVID-19 infection in August 2021.  She was hospitalized with abdominal pain at [facility] on April 22, 2020.  Her work-up included an abdominal CT that showed a relatively smaller size right kidney and suspicion for high-grade right renal artery stenosis.  Her creatinine was 1.1 at that time.  Incidental note was also made of a 1.6 cm right adrenal mass and prominent atherosclerosis in the infrarenal abdominal aorta. There was evidence of colonic distention and constipation which was felt to be the cause of her symptoms.  Most recent pacemaker interrogation shows normal device function and an estimated generator longevity of about 4 years.  She has a known MRI Medtronic Adapta dual-chamber device.  The rhythm is virtually 100% atrial paced, ventricular sensed but is not pacemaker dependent pacemaker interrogation continues to show normal device function.  The underlying rhythm is sinus bradycardia in the 40s.  She has had exceptionally good rhythm control while on dofetilide therapy and has not had atrial fibrillation in well over a year, although she has had an occasional episode  of brief nonsustained atrial tachycardia.  We have decreased the dose of dofetilide to 125 mcg twice daily based on renal function and prolongation of her QT interval.  QT interval today is in appropriate range  at 500 ms.  She has a history of hyperkalemia with ACE inhibitors in the past.  Amiodarone caused hypothyroidism and alopecia.   She has tried taking statins but has repeatedly stopped them over the years due to musculoskeletal problems.  She is currently seems to be tolerating rosuvastatin 20 mg daily and her most recent LDL cholesterol was slightly above target range at 84.  In late August 2020 she underwent an abdominal aortic angiogram with runoff, followed by angioplasty and drug coated stenting of a chronic total occlusion of the right superficial femoral artery. Preprocedure ABI was 0.82  Post procedure ABI was 1.1 at follow-up on October 18 2018, 1.0 in March 2021 (bilaterally).  By ultrasound performed in July 2021, known occluded mid right SFA stent with reconstitution above the level of the popliteal artery and three-vessel runoff, 80% stenosis in the proximal left SFA with widely patent mid left SFA stent.  She has normal left ventricular systolic function. She does have evidence of grade 2 diastolic dysfunction on previous echo. At the time of her bypass surgery she received an Atricure left atrial appendage clip. She is also on clopidogrel following her drug-eluting stents in April 2017. The pacemaker was implanted in 2013 for symptomatic sinus bradycardia. She also has a history of peripheral arterial disease and received bilateral superficial femoral artery stents in 2016, repeat revascularization with drug-coated stent to a totally occluded right superficial femoral artery in 2020. She has never smoked but drinks daily. She has treated hypertension.  After undergoing bypass surgery in November 2016 she returned with non-ST segment elevation myocardial infarction in April 2017 and required placement of stents both in the native right coronary artery (3.020 mm Synergy DES) and in the saphenous vein graft to the distal right coronary artery (2.7538 mm Synergy DES) due to early graft  dysfunction. The saphenous vein graft to the ramus intermedius was also occluded, but this vessel was left for medical therapy.    Echocardiogram in August 2022 showed abrupt reduction in LV function to an EF of 35-40% due to a new inferior wall motion abnormality.  Thallium viability study in September 2022 showed no evidence of myocardial infarction/scar, viability in all territories.  Past Medical History:  Diagnosis Date   ANXIETY 12/31/2009   Aortic insufficiency    a. mod by echo 2017.   CAD (coronary artery disease)    a. s/p CABGx3 and LAA clipping in 12/2014, NSTEMI 05/2015 s/p DES to native RCA and SVG-dRCA; occluded ramus-SVG was treated medically). c. neg nuc 10/2015 at [facility].   Chronic diastolic CHF (congestive heart failure) (HCC)    Chronic fatigue    CKD (chronic kidney disease), stage II    DEPRESSION 12/31/2009   [place] DISEASE, CERVICAL 12/31/2009   [place] DISEASE, LUMBAR 12/31/2009   DIVERTICULITIS, HX OF 12/31/2009   Essential hypertension    Gait abnormality 03/25/2017   GLUCOSE INTOLERANCE 12/31/2009   Habitual alcohol use    History of stroke    self reports " they say i have had some pin strokes"    Hypercalcemia    Hyperkalemia    Hyperlipidemia 12/31/2009   Hypothyroidism    MENOPAUSE, EARLY 12/31/2009   NSTEMI (non-ST elevated myocardial infarction) ([place]) 05/11/2015   Orthostatic hypotension    OSTEOARTHRITIS, HAND  Pacemaker 01/21/2012   MDT Adapta dual chamber   Paroxysmal atrial flutter ([place])    Persistent atrial fibrillation ([place])    PVD (peripheral vascular disease), hx stents to bil SFAs 02/2010    Rash, skin     superficial raised red pencil point sized rash bilateral forearms; states " it started when i started the Plavix "    Ruptured left breast implant    Symptomatic sinus bradycardia 01/22/2012   Syncope    a. 11/2015 ? due to medications.   Syncope    reports on 05-10-17 " i passed out 2 months ago in the bathroom and broke some ribs"      Tachy-brady syndrome ([place])    a. s/p MDT PPM 2013.   Tricuspid regurgitation     Past Surgical History:  Procedure Laterality Date   ABDOMINAL AORTOGRAM W/LOWER EXTREMITY Bilateral 02/15/2017   Procedure: ABDOMINAL AORTOGRAM W/LOWER EXTREMITY;  Surgeon: Lorretta Harp, MD;  Location: [place] [facility];  Service: Cardiovascular;  Laterality: Bilateral;  bilat   ABDOMINAL AORTOGRAM W/LOWER EXTREMITY N/A 10/06/2018   Procedure: ABDOMINAL AORTOGRAM W/LOWER EXTREMITY;  Surgeon: Lorretta Harp, MD;  Location: [place] [facility];  Service: Cardiovascular;  Laterality: N/A;   Ablation of typical atrial flutter (CTI line)  09/05/2016   Dr Antonieta Pert at [place]   Removed 01/2018   BREAST IMPLANT REMOVAL Bilateral 01/14/2018   Procedure: REMOVAL BILATERAL BREAST IMPLANTS;  Surgeon: Irene Limbo, MD;  Location: [place];  Service: Plastics;  Laterality: Bilateral;   CAPSULECTOMY Bilateral 01/14/2018   Procedure: CAPSULECTOMY;  Surgeon: Irene Limbo, MD;  Location: [place];  Service: Plastics;  Laterality: Bilateral;   CARDIAC CATHETERIZATION N/A 01/01/2015   Procedure: Left Heart Cath and Coronary Angiography;  Surgeon: Jettie Booze, MD;  Location: [place] [facility];  Service: Cardiovascular;  Laterality: N/A;   CARDIAC CATHETERIZATION N/A 05/12/2015   Procedure: Left Heart Cath and Coronary Angiography;  Surgeon: Troy Sine, MD;  Location: [place] [facility];  Service: Cardiovascular;  Laterality: N/A;   CARDIAC CATHETERIZATION N/A 05/12/2015   Procedure: Coronary Stent Intervention;  Surgeon: Troy Sine, MD;  Location: [place] [facility];  Service: Cardiovascular;  Laterality: N/A;   CARDIOVERSION N/A 02/01/2015   Procedure: CARDIOVERSION;  Surgeon: Lelon Perla, MD;  Location: [facility];  Service: Cardiovascular;  Laterality: N/A;   CARDIOVERSION N/A 08/30/2015   Procedure: CARDIOVERSION;  Surgeon:  Sanda Klein, MD;  Location: [place] [facility];  Service: Cardiovascular;  Laterality: N/A;   CARPAL TUNNEL RELEASE  2008   "right hand/thumb; carpal tunnel repair; got rid of arthritis" (01/21/2012)   CLIPPING OF ATRIAL APPENDAGE N/A 01/04/2015   Procedure: CLIPPING OF ATRIAL APPENDAGE;  Surgeon: Grace Isaac, MD;  Location: [place];  Service: Open Heart Surgery;  Laterality: N/A;   COLONOSCOPY N/A 12/21/2015   Procedure: COLONOSCOPY;  Surgeon: Milus Banister, MD;  Location: [facility];  Service: Endoscopy;  Laterality: N/A;   CORONARY ARTERY BYPASS GRAFT N/A 01/04/2015   Procedure: CORONARY ARTERY BYPASS GRAFTING (CABG) x 3 using left internal mammory artery and greater saphenous vein right leg harvested endoscopically.;  Surgeon: Grace Isaac, MD;  LIMA-LAD, SVG-RI, SVG-PDA   ESOPHAGOGASTRODUODENOSCOPY (EGD) WITH PROPOFOL N/A 12/18/2015   Procedure: ESOPHAGOGASTRODUODENOSCOPY (EGD) WITH PROPOFOL;  Surgeon: Milus Banister, MD;  Location: [facility];  Service: Endoscopy;  Laterality: N/A;   FACELIFT, LOWER 2/3  1995   "  mini" (01/21/2012)   GIVENS CAPSULE STUDY N/A 12/21/2015   Procedure: GIVENS CAPSULE STUDY;  Surgeon: Milus Banister, MD;  Location: [facility];  Service: Endoscopy;  Laterality: N/A;   Lower Arterial Examination  10/28/2011   R. SFA stent mild-moderate mixed density plaque with elevated velocities consistent with 50% diameter reduction. L. SFA stent moderate mixed denisty plaque at mid to distal level consistent with 50-69% diameter reduction.   LOWER EXTREMITY ANGIOGRAPHY N/A 02/15/2017   Procedure: LOWER EXTREMITY ANGIOGRAPHY;  Surgeon: Lorretta Harp, MD;  Location: [place] [facility];  Service: Cardiovascular;  Laterality: N/A;   OOPHORECTOMY  ~1979   PARATHYROIDECTOMY N/A 05/13/2017   Procedure: PARATHYROIDECTOMY;  Surgeon: Armandina Gemma, MD;  Location: [facility];  Service: General;  Laterality: N/A;   PARTIAL COLECTOMY  2010   PERIPHERAL ARTERIAL STENT GRAFT   2012; 2012   "LLE; RLE" (01/21/2012)   PERIPHERAL VASCULAR BALLOON ANGIOPLASTY Right 02/15/2017   Procedure: PERIPHERAL VASCULAR BALLOON ANGIOPLASTY;  Surgeon: Lorretta Harp, MD;  Location: [place] [facility];  Service: Cardiovascular;  Laterality: Right;  SFA     PERIPHERAL VASCULAR INTERVENTION Right 10/06/2018   Procedure: PERIPHERAL VASCULAR INTERVENTION;  Surgeon: Lorretta Harp, MD;  Location: [place] [facility];  Service: Cardiovascular;  Laterality: Right;   PERMANENT PACEMAKER INSERTION N/A 01/21/2012   Medtronic Adapta L implanted by Dr Sallyanne Kuster for tachy/brady syndrome   POSTERIOR CERVICAL LAMINECTOMY  1985   TEE WITHOUT CARDIOVERSION N/A 01/04/2015   Procedure: TRANSESOPHAGEAL ECHOCARDIOGRAM (TEE);  Surgeon: Grace Isaac, MD;  Location: [place];  Service: Open Heart Surgery;  Laterality: N/A;   TEE WITHOUT CARDIOVERSION N/A 02/01/2015   Procedure: TRANSESOPHAGEAL ECHOCARDIOGRAM (TEE);  Surgeon: Lelon Perla, MD;  Location: [facility] ENDOSCOPY;  Service: Cardiovascular;  Laterality: N/A;   TEE WITHOUT CARDIOVERSION N/A 08/30/2015   Procedure: TRANSESOPHAGEAL ECHOCARDIOGRAM (TEE);  Surgeon: Sanda Klein, MD;  Location: [facility] ENDOSCOPY;  Service: Cardiovascular;  Laterality: N/A;   VAGINAL HYSTERECTOMY  1975    Current Medications: Outpatient Medications Prior to Visit  Medication Sig Dispense Refill   amLODipine (NORVASC) 5 MG tablet Take 1 tablet (5 mg total) by mouth in the morning and at bedtime. 180 tablet 2   aspirin EC 81 MG tablet Take 81 mg by mouth daily.      Biotin 10000 MCG TABS Take 10,000 mcg by mouth daily.     carvedilol (COREG) 6.25 MG tablet Take 1 tablet (6.25 mg total) by mouth 2 (two) times daily with a meal. 60 tablet 5   Cholecalciferol (VITAMIN D3 PO) Take 1 tablet by mouth daily.     citalopram (CELEXA) 40 MG tablet Take 1 tablet (40 mg total) by mouth daily. 90 tablet 1   clopidogrel (PLAVIX) 75 MG tablet TAKE 1 TABLET (75 MG TOTAL) BY MOUTH DAILY  WITH BREAKFAST. 90 tablet 3   dofetilide (TIKOSYN) 125 MCG capsule TAKE 1 CAPSULE  BY MOUTH EVERY 12 HOURS. (GENERIC FOR TIKOSYN) 180 capsule 2   furosemide (LASIX) 80 MG tablet Take 1 tablet (80 mg total) by mouth daily. 90 tablet 3   isosorbide mononitrate (IMDUR) 30 MG 24 hr tablet Take 1 tablet (30 mg total) by mouth daily. 90 tablet 3   levothyroxine (SYNTHROID, LEVOTHROID) 25 MCG tablet Take 25 mcg by mouth daily before breakfast.      Magnesium 200 MG TABS Take 200 mg by mouth daily. Patient takes 1 tablet daily     Multiple Vitamins-Minerals (MULTI-VITE) LIQD Take 1 tablet  by mouth daily.      nitroGLYCERIN (NITROSTAT) 0.4 MG SL tablet Place 1 tablet (0.4 mg total) under the tongue every 5 (five) minutes x 3 doses as needed for chest pain. 25 tablet 2   Olive Leaf Extract 250 MG CAPS Take 250 mg by mouth 2 (two) times daily.     rosuvastatin (CRESTOR) 20 MG tablet Take 1 tablet (20 mg total) by mouth daily. 90 tablet 3   vitamin C (ASCORBIC ACID) 500 MG tablet Take 500 mg by mouth daily.     alendronate (FOSAMAX) 70 MG tablet Take 70 mg by mouth every Saturday. (Patient not taking: No sig reported)     No facility-administered medications prior to visit.     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  Retired.   Social Determinants of Health   Financial Resource Strain: Not on file  Food Insecurity: Not on file  Transportation Needs: Not on file  Physical Activity: Not on file  Stress: Not on file  Social Connections: Not on file     Family History:  The patient's family history includes CVA in her brother; Heart attack in her brother; Heart disease in her brother, father, and mother; Hypertension in her brother and mother; Stroke in her father and mother.   ROS:   Please see the history of present illness.   All other systems are reviewed and are negative.   PHYSICAL EXAM:   VS:  BP (!) 162/82   Pulse 82   Ht 5\' 4"  (1.626 m)   Wt 66 kg   SpO2 96%   BMI 24.96 kg/m     Wt Readings from Last 3 Encounters:  11/01/20 66 kg  10/02/20 65.3 kg  08/07/20 66 kg     General: Alert, oriented x3, no distress, appears well.  Healthy left subclavian pacemaker site. Head: no evidence of trauma, PERRL, EOMI, no exophtalmos or lid lag, no myxedema, no xanthelasma; normal ears, nose and oropharynx Neck: normal jugular venous pulsations and no hepatojugular reflux; brisk carotid pulses without delay and no carotid bruits Chest: clear to auscultation, no signs of consolidation by percussion or palpation, normal fremitus, symmetrical and full respiratory excursions Cardiovascular: normal position and quality of the apical impulse, regular rhythm, normal first and second heart sounds, no murmurs, rubs or gallops Abdomen: no tenderness or distention, no masses by palpation, no abnormal pulsatility or arterial bruits, normal bowel sounds, no hepatosplenomegaly Extremities: no  clubbing, cyanosis or edema; 2+ radial, ulnar and brachial pulses bilaterally; 2+ right femoral, posterior  tibial and dorsalis pedis pulses; 2+ left femoral, posterior tibial and dorsalis pedis pulses; no subclavian or femoral bruits Neurological: grossly nonfocal Psych: Normal mood and affect    Studies/Labs Reviewed:  Thallium rest redistribution viability study 10/28/2020 :   Prior study available for comparison. Performed at [facility] 12/2015.  Negative per patient.  No results available for review   The rest and delayed thallium study shows viable myocardium in all segments of the LV   There is no evidence of previous myocardial infarction .   ECHO 09/24/2020:   1. Compared with the echo 10/2922, systolic function has worsened. Left ventricular ejection fraction, by estimation, is 35 to 40%. The left ventricle has moderately decreased function. The left ventricle demonstrates global hypokinesis. There is mild concentric left ventricular hypertrophy. Left ventricular diastolic parameters are consistent with Grade II diastolic dysfunction (pseudonormalization). Elevated left ventricular end-diastolic pressure.   2. Right ventricular systolic function is normal. The right ventricular size is normal. There is moderately elevated pulmonary artery systolic pressure.   3. Left atrial size was moderately dilated.   4. The mitral valve is normal in structure. Mild mitral valve regurgitation. No evidence of mitral stenosis.   5. Tricuspid valve regurgitation is moderate to severe.   6. The aortic valve is tricuspid. There is mild calcification of the  aortic valve. There is mild thickening of the aortic valve. Aortic valve  regurgitation is mild to moderate. No aortic stenosis is present.   7. The inferior vena cava is normal in size with greater than 50%  respiratory variability, suggesting right atrial pressure of 3 mmHg.   Cardiac catheterization 05/12/2015 Mid LAD lesion, 85% stenosed. Ramus lesion, 80% stenosed. Prox Cx to Mid Cx lesion, 40% stenosed. SVG was injected is normal in caliber. There is  severe disease in the graft. Ostial occlusion of SVG to the ramus intermediate vessel. Origin to Prox Graft lesion, 100% stenosed. LIMA was injected is normal in caliber, and is anatomically normal. There is competitive flow. Widely patent LIMA graft which supplies the mid LAD. SVG was injected is large. There is severe disease in the graft. Severely diseased proximal to mid portion of the vein graft which supplied the PDA vessel. Of note, the distal RCA. Antegrade to the PDA was totally occluded and did not supply the proximal to mid RCA due to this occlusion. Prox Graft lesion, 70% stenosed. Dist RCA-1 lesion, 100% stenosed. Dist RCA-2 lesion, 100% stenosed. Origin lesion, 95% stenosed. Post intervention, there is a 0% residual stenosis. Prox RCA lesion, 95% stenosed. Post intervention, there is a 0% residual stenosis. There is moderate left ventricular systolic dysfunction.   Significant native multivessel CAD with diffuse 85% proximal LAD stenosis with evidence for competitive filling via the LIMA graft supplying the mid LAD; 75-80% diffuse proximal ramus intermediate stenosis with evidence for an occluded  vein graft anastomosed in its midst portion of the vessel; 40% left circumflex stenosis, and 95% proximal RCA stenosis which supplies 2 moderate size marginal branches prior to being totally occluded in the region of the acute margin/distal RCA.   Severely diseased SVG which supplies a large distal RCA system giving rise to several branches and anastomosing into the PDA.  The native distal RCA is occluded a short distance prior to the PDA takeoff and the graft does not supply the proximal - mid native vessel due to this occlusion.  Occluded SVG at its origin to the ramus intermediate vessel.   Patent LIMA graft supplying the mid LAD.   Successful percutaneous coronary intervention of the proximal to mid vein graft supplying the large distal RCA with ultimate insertion of a 2.7538  mm Synergy DES stent postdilated to 2.91 mm with the 95 and diffuse 70% stenosis being reduced to 0%.   Successful percutaneous coronary intervention to the proximal native RCA which supplies 2 moderate size marginal branches prior to the acute margin/ distal RCA occlusion with PTCA/ 3.020 mm Synergy DES stent postdilated to 3.25 mm with the 95% stenosis being reduced to 0%.   RECOMMENDATION: Ms. Clavel has a history of rash secondary to Plavix.  She will be treated with aspirin/Brilinta forward DAPT therapy.  She had experienced continued anginal type symptoms, although mild, despite her bypass surgery which may have been due to a proximal RCA.  Subsequently she has developed unstable angina leading to her non-STEMI.  Following successful intervention, the SVG to her large distal RCA is now wide open as is her proximal native RCA which supplies two marginal branches which were not supplied by the SVG graft.  If the patient continues to experience increasing symptoms of chest pain consider PCI to the ramus intermediate vessel since the graft that supplied this is occluded.  Diagnostic Dominance: Right Intervention  Implants     Permanent Stent  Stent Synergy Des 2.75x38 - [id number] - Implanted  Stent Synergy Des 3x20 - [id number] - Implanted    EKG:  EKG is ordered today.  Personally reviewed, it shows atrial paced, ventricular sensed rhythm.  There are no Q waves in the inferior leads.  There is a chronic QS pattern in leads V1-V2.  There is also chronic ST segment depression in V5-V6.  QTc 500 ms (on dofetilide).  Recent Labs: 06/11/2020: ALT 13 10/04/2020: Hemoglobin 15.0; Platelets 307 10/21/2020: BUN 20; Creatinine, Ser 1.14; Magnesium 2.3; NT-Pro BNP 2,967; Potassium 4.8; Sodium 140   Lipid Panel    Component Value Date/Time   CHOL 202 (H) 06/11/2020 0952   TRIG 177 (H) 06/11/2020 0952   HDL 89 06/11/2020 0952   CHOLHDL 2.3 06/11/2020 0952   CHOLHDL 2.5 05/12/2015 0324   VLDL 23  05/12/2015 0324   LDLCALC 84 06/11/2020 0952    ASSESSMENT:    1. Coronary artery disease of bypass graft of native heart with stable angina pectoris ([place])   2. PAF (paroxysmal atrial fibrillation) ([place])   3. Encounter for monitoring dofetilide therapy   4. SSS (sick sinus syndrome) ([place])   5. Pacemaker   6. Chronic combined systolic and diastolic heart failure ([place])   7. PAD (peripheral artery disease) ([place])   8. Renal artery stenosis ([place])   9. Cerebrovascular small vessel disease   10. Essential hypertension   11. Hypercholesterolemia   12. Nonrheumatic tricuspid valve regurgitation   13. Pre-op testing       PLAN:  In order of problems listed above:  CAD: [name] is having problems with chest discomfort, mostly with exertion and has evidence of inferior wall hypokinesis despite viability on thallium study.  She had early failure of the SVG to RCA in 2017, after her bypass surgery.  She subsequently underwent stenting both of the native right coronary artery and of the saphenous vein graft.  It appears likely that 1 or both of these conduits are now occluded/stenotic.  We discussed the benefits of coronary angiography.  If viability is maintained due to the presence  of residual flow through the right coronary artery, she might benefit from PCI-stent. The saphenous vein graft had very early onset of deterioration requiring proximal stents only 3 months after the bypass surgery and by now is probably occluded.  If inferior wall viability is maintained only via collaterals, only medical therapy will be an option, but this is unlikely to lead to improvement in LV function.   It appears that she has had some benefit from antianginal treatment with isosorbide mononitrate, which was added to chronic treatment with carvedilol and amlodipine a few weeks ago.  We discussed the risks and benefits of cardiac catheterization and possible percutaneous intervention/stent in detail today.  [name]'s best  friend, who is a retired [employer] scientist and her daughter were present for this discussion.  We reviewed the possibility that we may find a chronic total occlusion and that this may or may not be amenable to revascularization.  Also reviewed the risks of complications with cardiac catheterization in view of her extensive vascular history and recently labile renal function.  Discussed the possibility of contrast nephrotoxicity and even the need for hemodialysis, but I am heartened by the recent improvement in her renal function parameters. This procedure has been fully reviewed with the patient and written informed consent has been obtained.   AFib: Excellent response to dofetilide, without any atrial fibrillation detected in well over 12 months.  She has had clipping over left atrial appendage and is not on anticoagulation.  She does take aspirin and clopidogrel for PAD with stents in both lower extremities (most recently in 2020.   Dofetilide: Dose of dofetilide decreased after reduction in kidney function with current QTC at upper limit of appropriate range.  She has had none excellent clinical response with marked reduction in the burden of atrial fibrillation. SSS: She has had virtually 100% atrial paced, ventricular sensed rhythm and the lower limit of the atrial rate is set slightly high, since this appears to have helped to avoid recurrence of atrial fibrillation.  Underlying rhythm is sinus bradycardia at about 45 bpm, she is not pacemaker dependent. PPM: Normal pacemaker function.  Continue remote downloads every 3 months. CHF: Although her echo showed evidence of elevated mean left atrial pressure, she does not have overt hypervolemia on physical exam.  She has a tendency to develop hyperkalemia, especially when on RAAS inhibitors.  In the past she had a relatively low blood pressure and we discontinue the tiny dose of valsartan that she was taking.  Unsure how this may relate to the occlusion of the right  renal artery (maybe there is some stenosis in the left renal artery as well). PAD: Denies claudication.  Improved symptoms following her last revascularization procedure in August 2021.  Despite restenosis on one side, she has ABI 1.0 bilaterally and does not have intermittent claudication.  On dual antiplatelet therapy.  Also noted that she has lower blood pressure in the left arm suggesting left subclavian stenosis.  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 ASCVD: No stenoses in the extracranial carotid arteries.  She has mostly small vessel disease and the plan is for her to remain on clopidogrel indefinitely.  Avoid hypotension.  Allow SBP around 140 (that is why we  stopped her valsartan) . HTN: Blood pressure is a little high today, but on the average has been on the low end recently.  We we had to stop her valsartan.  Have been using beta-blockers, calcium channel blockers, nitrates for their antianginal benefit, but may need to revise her regimen based on the lower ejection fraction.  Note that she has lower blood pressure in the left arm and should always check blood pressure on the right. Avoid ACE inhibitors due to history of hyperkalemia while taking lisinopril. Hypercholesterolemia: Previously reported intolerance to atorvastatin and to rosuvastatin (muscle aches and hair loss).  Recently, she appears to be tolerating rosuvastatin 10 mg once daily, but on this regimen her LDL cholesterol was still high at 84 and her rosuvastatin dose was increased to 20 mg daily recently.  Plan to recheck a lipid profile in another couple of months.  If she does not tolerate this would switch to /add Repatha.  She  does have an excellent HDL at 89, but has also had progression of disease with recent occlusion of the right renal artery and probable loss of the SVG-RCA. TR: moderate to severe on TEE 2017 before SVG-RCA stent, mild in 2018, moderate 2021, now moderate-to-severe. Partly pacemaker lead related, but quite possibly it is worse due to RV ischemia. 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  Normal calcium levels by most recent labs performed November 2021.     Shared Decision Making/Informed Consent The risks [stroke (1 in 1000), death (1 in 1000), kidney failure [usually temporary] (1 in 500), bleeding (1 in 200), allergic reaction [possibly serious] (1 in 200)], benefits (diagnostic support and management of coronary artery disease) and alternatives of a cardiac catheterization were discussed in detail with [name] and she is willing to proceed.      Medication Adjustments/Labs and Tests Ordered: Current medicines are reviewed at length with the patient today.  Concerns regarding medicines are outlined above.  Medication changes, Labs and Tests ordered today are listed in the Patient Instructions below. Patient Instructions  Medication Instructions:  No changes  Testing/Procedures: Your physician has requested that you have a cardiac catheterization. Cardiac catheterization is used to diagnose and/or treat various heart conditions. Doctors may recommend this procedure for a number of different reasons. The most common reason is to evaluate chest pain. Chest pain can be a symptom of coronary artery disease (CAD), and cardiac catheterization can show whether plaque is narrowing or blocking your heart's arteries. This procedure is also used to evaluate the valves, as well as measure the blood flow and oxygen levels in different parts of your heart. For further information please visit [email or web address]. Please follow instruction sheet, as given.  Follow-Up: At  [facility], you and your health needs are our priority.  As part of our continuing mission to provide you with exceptional heart care, we have created designated Provider Care Teams.  These Care Teams include your primary Cardiologist (physician) and Advanced Practice Providers (APPs -  Physician Assistants and Nurse Practitioners) who all work together to provide you with the care you need, when you need it.  We recommend signing up for the patient portal called "MyChart".  Sign up information is provided on this After Visit Summary.  MyChart is used to connect with patients for Virtual Visits (Telemedicine).  Patients are able to view lab/test results, encounter notes, upcoming appointments, etc.  Non-urgent messages can be sent to your provider as well.   To learn more about what  you can do with MyChart, go to [email or web address].    Your next appointment:   3 month(s)  The format for your next appointment:   In Person  Provider:   Sanda Klein, MD   Other Instructions  [place] [address] Dept: [phone] Loc: [phone]  [name]  11/01/2020  You are scheduled for a Cardiac Catheterization on Thursday, September 29 with Dr. Peter Martinique.  1. Please arrive at the [name] (Main Entrance A) at [facility]: [address] [place], [place] [address] at 5:30 AM (This time is 4 hours before your procedure to ensure your preparation). Free valet parking service is available.   Special note: Every effort is made to have your procedure done on time. Please understand that emergencies sometimes delay scheduled procedures.  2. Diet: Do not eat solid foods after midnight.  The patient may have clear liquids until 5am upon the day of the procedure.  3. Labs: You will need to have blood drawn on Monday 9/26. You may have this done at our office or  any [facility]. You do not need an appointment or need to be fasting. The lab opens at 8 am.  4. Medication instructions in preparation for your procedure: Hold the Furosemide the morning of the procedure  On the morning of your procedure, take your Aspirin and any morning medicines NOT listed above.  You may use sips of water.  5. Plan for one night stay--bring personal belongings. 6. Bring a current list of your medications and current insurance cards. 7. You MUST have a responsible person to drive you home. 8. Someone MUST be with you the first 24 hours after you arrive home or your discharge will be delayed. 9. Please wear clothes that are easy to get on and off and wear slip-on shoes.  Thank you for allowing [place] to care for you!   -- [facility]    Signed, Sanda Klein, MD  11/02/2020 4:34 AM    [name], [place], [place]  [address] Phone: [phone]; Fax: [phone]

## 2020-11-02 NOTE — H&P (View-Only) (Signed)
Patient ID: [name], female   DOB: 02-23-1940, 80 y.o.   MRN: [id number]     Cardiology Office Note    Date:  11/02/2020   ID:  [name], [name] 08-07-1940, MRN [id number]  PCP:  Horald Pollen, MD  Cardiologist:   Sanda Klein, MD   Chief Complaint  Patient presents with   Coronary Artery Disease    Follow-up thallium viability stress test     History of Present Illness:  [name] is a 80 y.o. female with coronary artery disease s/p bypass surgery (November 2016) and placement of drug-eluting stents (DES to distal SVG-RCA, April 2017 ) and left atrial appendage clipping, symptomatic paroxysmal atrial fibrillation and atrial flutter with radiofrequency ablation (cavotricuspid isthmus and pulmonary vein isolation), PAD with previous lower extremity stents, extensive intracranial atherosclerotic disease, history of GI bleeding while on anticoagulation, volatile hypertension.  She continues to have problems with exertional chest discomfort and exertional dyspnea for the last several months, NYHA functional class II.  At rest, she does not usually have dyspnea or chest tightness, but does have some lingering vague discomfort at times.  She denies palpitations, syncope or edema, orthopnea or PND.  An echocardiogram that we performed 09/24/2020 showed a new inferior wall motion abnormality and a reduction in LV ejection fraction to 35-40%, as well as evidence of elevated mean left atrial pressure.  Previous assessment by echo in 08/10/2019 showed normal LV wall motion and ejection fraction, although with evidence of diastolic dysfunction.  The ECG does not show Q waves in the inferior leads (does have a chronic QS pattern in leads V1 and V2) and did not show any acute ST-T changes.  Recommended cardiac MRI for viability assessment, but her Medtronic Adapta pacemaker is not officially MRI conditional.  Performed a thallium viability study 10/28/2020 that showed viable  myocardium throughout without any evidence of previous infarction.  About 6 months ago had abrupt worsening of kidney function with increase of creatinine to 1.6, from a baseline of 0.8-1.1.  Renal duplex arterial ultrasound on 07/05/2020 showed absent flow in the right renal artery (previously described as stenotic on CT angiogram 04/22/2020 from the [facility]).  The right kidney was also described as atrophic with a maximum diameter of only 7.3 cm.  Nevertheless, creatinine has steadily improved since March down to 1.25 in May and 1.14 on 10/21/2020  She recovered well from COVID-19 infection in August 2021.  She was hospitalized with abdominal pain at [facility] on April 22, 2020.  Her work-up included an abdominal CT that showed a relatively smaller size right kidney and suspicion for high-grade right renal artery stenosis.  Her creatinine was 1.1 at that time.  Incidental note was also made of a 1.6 cm right adrenal mass and prominent atherosclerosis in the infrarenal abdominal aorta. There was evidence of colonic distention and constipation which was felt to be the cause of her symptoms.  Most recent pacemaker interrogation shows normal device function and an estimated generator longevity of about 4 years.  She has a known MRI Medtronic Adapta dual-chamber device.  The rhythm is virtually 100% atrial paced, ventricular sensed but is not pacemaker dependent pacemaker interrogation continues to show normal device function.  The underlying rhythm is sinus bradycardia in the 40s.  She has had exceptionally good rhythm control while on dofetilide therapy and has not had atrial fibrillation in well over a year, although she has had an occasional episode  of brief nonsustained atrial tachycardia.  We have decreased the dose of dofetilide to 125 mcg twice daily based on renal function and prolongation of her QT interval.  QT interval today is in appropriate range  at 500 ms.  She has a history of hyperkalemia with ACE inhibitors in the past.  Amiodarone caused hypothyroidism and alopecia.   She has tried taking statins but has repeatedly stopped them over the years due to musculoskeletal problems.  She is currently seems to be tolerating rosuvastatin 20 mg daily and her most recent LDL cholesterol was slightly above target range at 84.  In late August 2020 she underwent an abdominal aortic angiogram with runoff, followed by angioplasty and drug coated stenting of a chronic total occlusion of the right superficial femoral artery. Preprocedure ABI was 0.82  Post procedure ABI was 1.1 at follow-up on October 18 2018, 1.0 in March 2021 (bilaterally).  By ultrasound performed in July 2021, known occluded mid right SFA stent with reconstitution above the level of the popliteal artery and three-vessel runoff, 80% stenosis in the proximal left SFA with widely patent mid left SFA stent.  She has normal left ventricular systolic function. She does have evidence of grade 2 diastolic dysfunction on previous echo. At the time of her bypass surgery she received an Atricure left atrial appendage clip. She is also on clopidogrel following her drug-eluting stents in April 2017. The pacemaker was implanted in 2013 for symptomatic sinus bradycardia. She also has a history of peripheral arterial disease and received bilateral superficial femoral artery stents in 2016, repeat revascularization with drug-coated stent to a totally occluded right superficial femoral artery in 2020. She has never smoked but drinks daily. She has treated hypertension.  After undergoing bypass surgery in November 2016 she returned with non-ST segment elevation myocardial infarction in April 2017 and required placement of stents both in the native right coronary artery (3.020 mm Synergy DES) and in the saphenous vein graft to the distal right coronary artery (2.7538 mm Synergy DES) due to early graft  dysfunction. The saphenous vein graft to the ramus intermedius was also occluded, but this vessel was left for medical therapy.    Echocardiogram in August 2022 showed abrupt reduction in LV function to an EF of 35-40% due to a new inferior wall motion abnormality.  Thallium viability study in September 2022 showed no evidence of myocardial infarction/scar, viability in all territories.  Past Medical History:  Diagnosis Date   ANXIETY 12/31/2009   Aortic insufficiency    a. mod by echo 2017.   CAD (coronary artery disease)    a. s/p CABGx3 and LAA clipping in 12/2014, NSTEMI 05/2015 s/p DES to native RCA and SVG-dRCA; occluded ramus-SVG was treated medically). c. neg nuc 10/2015 at [facility].   Chronic diastolic CHF (congestive heart failure) (HCC)    Chronic fatigue    CKD (chronic kidney disease), stage II    DEPRESSION 12/31/2009   [place] DISEASE, CERVICAL 12/31/2009   [place] DISEASE, LUMBAR 12/31/2009   DIVERTICULITIS, HX OF 12/31/2009   Essential hypertension    Gait abnormality 03/25/2017   GLUCOSE INTOLERANCE 12/31/2009   Habitual alcohol use    History of stroke    self reports " they say i have had some pin strokes"    Hypercalcemia    Hyperkalemia    Hyperlipidemia 12/31/2009   Hypothyroidism    MENOPAUSE, EARLY 12/31/2009   NSTEMI (non-ST elevated myocardial infarction) ([place]) 05/11/2015   Orthostatic hypotension    OSTEOARTHRITIS, HAND  Pacemaker 01/21/2012   MDT Adapta dual chamber   Paroxysmal atrial flutter ([place])    Persistent atrial fibrillation ([place])    PVD (peripheral vascular disease), hx stents to bil SFAs 02/2010    Rash, skin     superficial raised red pencil point sized rash bilateral forearms; states " it started when i started the Plavix "    Ruptured left breast implant    Symptomatic sinus bradycardia 01/22/2012   Syncope    a. 11/2015 ? due to medications.   Syncope    reports on 05-10-17 " i passed out 2 months ago in the bathroom and broke some ribs"      Tachy-brady syndrome ([place])    a. s/p MDT PPM 2013.   Tricuspid regurgitation     Past Surgical History:  Procedure Laterality Date   ABDOMINAL AORTOGRAM W/LOWER EXTREMITY Bilateral 02/15/2017   Procedure: ABDOMINAL AORTOGRAM W/LOWER EXTREMITY;  Surgeon: Lorretta Harp, MD;  Location: [place] [facility];  Service: Cardiovascular;  Laterality: Bilateral;  bilat   ABDOMINAL AORTOGRAM W/LOWER EXTREMITY N/A 10/06/2018   Procedure: ABDOMINAL AORTOGRAM W/LOWER EXTREMITY;  Surgeon: Lorretta Harp, MD;  Location: [place] [facility];  Service: Cardiovascular;  Laterality: N/A;   Ablation of typical atrial flutter (CTI line)  09/05/2016   Dr Antonieta Pert at [place]   Removed 01/2018   BREAST IMPLANT REMOVAL Bilateral 01/14/2018   Procedure: REMOVAL BILATERAL BREAST IMPLANTS;  Surgeon: Irene Limbo, MD;  Location: [place];  Service: Plastics;  Laterality: Bilateral;   CAPSULECTOMY Bilateral 01/14/2018   Procedure: CAPSULECTOMY;  Surgeon: Irene Limbo, MD;  Location: [place];  Service: Plastics;  Laterality: Bilateral;   CARDIAC CATHETERIZATION N/A 01/01/2015   Procedure: Left Heart Cath and Coronary Angiography;  Surgeon: Jettie Booze, MD;  Location: [place] [facility];  Service: Cardiovascular;  Laterality: N/A;   CARDIAC CATHETERIZATION N/A 05/12/2015   Procedure: Left Heart Cath and Coronary Angiography;  Surgeon: Troy Sine, MD;  Location: [place] [facility];  Service: Cardiovascular;  Laterality: N/A;   CARDIAC CATHETERIZATION N/A 05/12/2015   Procedure: Coronary Stent Intervention;  Surgeon: Troy Sine, MD;  Location: [place] [facility];  Service: Cardiovascular;  Laterality: N/A;   CARDIOVERSION N/A 02/01/2015   Procedure: CARDIOVERSION;  Surgeon: Lelon Perla, MD;  Location: [facility] ENDOSCOPY;  Service: Cardiovascular;  Laterality: N/A;   CARDIOVERSION N/A 08/30/2015   Procedure: CARDIOVERSION;  Surgeon:  Sanda Klein, MD;  Location: [place] ENDOSCOPY;  Service: Cardiovascular;  Laterality: N/A;   CARPAL TUNNEL RELEASE  2008   "right hand/thumb; carpal tunnel repair; got rid of arthritis" (01/21/2012)   CLIPPING OF ATRIAL APPENDAGE N/A 01/04/2015   Procedure: CLIPPING OF ATRIAL APPENDAGE;  Surgeon: Grace Isaac, MD;  Location: [place];  Service: Open Heart Surgery;  Laterality: N/A;   COLONOSCOPY N/A 12/21/2015   Procedure: COLONOSCOPY;  Surgeon: Milus Banister, MD;  Location: [facility] ENDOSCOPY;  Service: Endoscopy;  Laterality: N/A;   CORONARY ARTERY BYPASS GRAFT N/A 01/04/2015   Procedure: CORONARY ARTERY BYPASS GRAFTING (CABG) x 3 using left internal mammory artery and greater saphenous vein right leg harvested endoscopically.;  Surgeon: Grace Isaac, MD;  LIMA-LAD, SVG-RI, SVG-PDA   ESOPHAGOGASTRODUODENOSCOPY (EGD) WITH PROPOFOL N/A 12/18/2015   Procedure: ESOPHAGOGASTRODUODENOSCOPY (EGD) WITH PROPOFOL;  Surgeon: Milus Banister, MD;  Location: [facility];  Service: Endoscopy;  Laterality: N/A;   FACELIFT, LOWER 2/3  1995   "  mini" (01/21/2012)   GIVENS CAPSULE STUDY N/A 12/21/2015   Procedure: GIVENS CAPSULE STUDY;  Surgeon: Milus Banister, MD;  Location: [facility];  Service: Endoscopy;  Laterality: N/A;   Lower Arterial Examination  10/28/2011   R. SFA stent mild-moderate mixed density plaque with elevated velocities consistent with 50% diameter reduction. L. SFA stent moderate mixed denisty plaque at mid to distal level consistent with 50-69% diameter reduction.   LOWER EXTREMITY ANGIOGRAPHY N/A 02/15/2017   Procedure: LOWER EXTREMITY ANGIOGRAPHY;  Surgeon: Lorretta Harp, MD;  Location: [place] [facility];  Service: Cardiovascular;  Laterality: N/A;   OOPHORECTOMY  ~1979   PARATHYROIDECTOMY N/A 05/13/2017   Procedure: PARATHYROIDECTOMY;  Surgeon: Armandina Gemma, MD;  Location: [facility];  Service: General;  Laterality: N/A;   PARTIAL COLECTOMY  2010   PERIPHERAL ARTERIAL STENT GRAFT   2012; 2012   "LLE; RLE" (01/21/2012)   PERIPHERAL VASCULAR BALLOON ANGIOPLASTY Right 02/15/2017   Procedure: PERIPHERAL VASCULAR BALLOON ANGIOPLASTY;  Surgeon: Lorretta Harp, MD;  Location: [place] [facility];  Service: Cardiovascular;  Laterality: Right;  SFA     PERIPHERAL VASCULAR INTERVENTION Right 10/06/2018   Procedure: PERIPHERAL VASCULAR INTERVENTION;  Surgeon: Lorretta Harp, MD;  Location: [place] [facility];  Service: Cardiovascular;  Laterality: Right;   PERMANENT PACEMAKER INSERTION N/A 01/21/2012   Medtronic Adapta L implanted by Dr Sallyanne Kuster for tachy/brady syndrome   POSTERIOR CERVICAL LAMINECTOMY  1985   TEE WITHOUT CARDIOVERSION N/A 01/04/2015   Procedure: TRANSESOPHAGEAL ECHOCARDIOGRAM (TEE);  Surgeon: Grace Isaac, MD;  Location: [place];  Service: Open Heart Surgery;  Laterality: N/A;   TEE WITHOUT CARDIOVERSION N/A 02/01/2015   Procedure: TRANSESOPHAGEAL ECHOCARDIOGRAM (TEE);  Surgeon: Lelon Perla, MD;  Location: [facility] ENDOSCOPY;  Service: Cardiovascular;  Laterality: N/A;   TEE WITHOUT CARDIOVERSION N/A 08/30/2015   Procedure: TRANSESOPHAGEAL ECHOCARDIOGRAM (TEE);  Surgeon: Sanda Klein, MD;  Location: [facility] ENDOSCOPY;  Service: Cardiovascular;  Laterality: N/A;   VAGINAL HYSTERECTOMY  1975    Current Medications: Outpatient Medications Prior to Visit  Medication Sig Dispense Refill   amLODipine (NORVASC) 5 MG tablet Take 1 tablet (5 mg total) by mouth in the morning and at bedtime. 180 tablet 2   aspirin EC 81 MG tablet Take 81 mg by mouth daily.      Biotin 10000 MCG TABS Take 10,000 mcg by mouth daily.     carvedilol (COREG) 6.25 MG tablet Take 1 tablet (6.25 mg total) by mouth 2 (two) times daily with a meal. 60 tablet 5   Cholecalciferol (VITAMIN D3 PO) Take 1 tablet by mouth daily.     citalopram (CELEXA) 40 MG tablet Take 1 tablet (40 mg total) by mouth daily. 90 tablet 1   clopidogrel (PLAVIX) 75 MG tablet TAKE 1 TABLET (75 MG TOTAL) BY MOUTH DAILY  WITH BREAKFAST. 90 tablet 3   dofetilide (TIKOSYN) 125 MCG capsule TAKE 1 CAPSULE  BY MOUTH EVERY 12 HOURS. (GENERIC FOR TIKOSYN) 180 capsule 2   furosemide (LASIX) 80 MG tablet Take 1 tablet (80 mg total) by mouth daily. 90 tablet 3   isosorbide mononitrate (IMDUR) 30 MG 24 hr tablet Take 1 tablet (30 mg total) by mouth daily. 90 tablet 3   levothyroxine (SYNTHROID, LEVOTHROID) 25 MCG tablet Take 25 mcg by mouth daily before breakfast.      Magnesium 200 MG TABS Take 200 mg by mouth daily. Patient takes 1 tablet daily     Multiple Vitamins-Minerals (MULTI-VITE) LIQD Take 1 tablet  by mouth daily.      nitroGLYCERIN (NITROSTAT) 0.4 MG SL tablet Place 1 tablet (0.4 mg total) under the tongue every 5 (five) minutes x 3 doses as needed for chest pain. 25 tablet 2   Olive Leaf Extract 250 MG CAPS Take 250 mg by mouth 2 (two) times daily.     rosuvastatin (CRESTOR) 20 MG tablet Take 1 tablet (20 mg total) by mouth daily. 90 tablet 3   vitamin C (ASCORBIC ACID) 500 MG tablet Take 500 mg by mouth daily.     alendronate (FOSAMAX) 70 MG tablet Take 70 mg by mouth every Saturday. (Patient not taking: No sig reported)     No facility-administered medications prior to visit.     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  Retired.   Social Determinants of Health   Financial Resource Strain: Not on file  Food Insecurity: Not on file  Transportation Needs: Not on file  Physical Activity: Not on file  Stress: Not on file  Social Connections: Not on file     Family History:  The patient's family history includes CVA in her brother; Heart attack in her brother; Heart disease in her brother, father, and mother; Hypertension in her brother and mother; Stroke in her father and mother.   ROS:   Please see the history of present illness.   All other systems are reviewed and are negative.   PHYSICAL EXAM:   VS:  BP (!) 162/82   Pulse 82   Ht 5\' 4"  (1.626 m)   Wt 66 kg   SpO2 96%   BMI 24.96 kg/m     Wt Readings from Last 3 Encounters:  11/01/20 66 kg  10/02/20 65.3 kg  08/07/20 66 kg     General: Alert, oriented x3, no distress, appears well.  Healthy left subclavian pacemaker site. Head: no evidence of trauma, PERRL, EOMI, no exophtalmos or lid lag, no myxedema, no xanthelasma; normal ears, nose and oropharynx Neck: normal jugular venous pulsations and no hepatojugular reflux; brisk carotid pulses without delay and no carotid bruits Chest: clear to auscultation, no signs of consolidation by percussion or palpation, normal fremitus, symmetrical and full respiratory excursions Cardiovascular: normal position and quality of the apical impulse, regular rhythm, normal first and second heart sounds, no murmurs, rubs or gallops Abdomen: no tenderness or distention, no masses by palpation, no abnormal pulsatility or arterial bruits, normal bowel sounds, no hepatosplenomegaly Extremities: no  clubbing, cyanosis or edema; 2+ radial, ulnar and brachial pulses bilaterally; 2+ right femoral, posterior  tibial and dorsalis pedis pulses; 2+ left femoral, posterior tibial and dorsalis pedis pulses; no subclavian or femoral bruits Neurological: grossly nonfocal Psych: Normal mood and affect    Studies/Labs Reviewed:  Thallium rest redistribution viability study 10/28/2020 :   Prior study available for comparison. Performed at [facility] 12/2015.  Negative per patient.  No results available for review   The rest and delayed thallium study shows viable myocardium in all segments of the LV   There is no evidence of previous myocardial infarction .   ECHO 09/24/2020:   1. Compared with the echo 0/8676, systolic function has worsened. Left ventricular ejection fraction, by estimation, is 35 to 40%. The left ventricle has moderately decreased function. The left ventricle demonstrates global hypokinesis. There is mild concentric left ventricular hypertrophy. Left ventricular diastolic parameters are consistent with Grade II diastolic dysfunction (pseudonormalization). Elevated left ventricular end-diastolic pressure.   2. Right ventricular systolic function is normal. The right ventricular size is normal. There is moderately elevated pulmonary artery systolic pressure.   3. Left atrial size was moderately dilated.   4. The mitral valve is normal in structure. Mild mitral valve regurgitation. No evidence of mitral stenosis.   5. Tricuspid valve regurgitation is moderate to severe.   6. The aortic valve is tricuspid. There is mild calcification of the  aortic valve. There is mild thickening of the aortic valve. Aortic valve  regurgitation is mild to moderate. No aortic stenosis is present.   7. The inferior vena cava is normal in size with greater than 50%  respiratory variability, suggesting right atrial pressure of 3 mmHg.   Cardiac catheterization 05/12/2015 Mid LAD lesion, 85% stenosed. Ramus lesion, 80% stenosed. Prox Cx to Mid Cx lesion, 40% stenosed. SVG was injected is normal in caliber. There is  severe disease in the graft. Ostial occlusion of SVG to the ramus intermediate vessel. Origin to Prox Graft lesion, 100% stenosed. LIMA was injected is normal in caliber, and is anatomically normal. There is competitive flow. Widely patent LIMA graft which supplies the mid LAD. SVG was injected is large. There is severe disease in the graft. Severely diseased proximal to mid portion of the vein graft which supplied the PDA vessel. Of note, the distal RCA. Antegrade to the PDA was totally occluded and did not supply the proximal to mid RCA due to this occlusion. Prox Graft lesion, 70% stenosed. Dist RCA-1 lesion, 100% stenosed. Dist RCA-2 lesion, 100% stenosed. Origin lesion, 95% stenosed. Post intervention, there is a 0% residual stenosis. Prox RCA lesion, 95% stenosed. Post intervention, there is a 0% residual stenosis. There is moderate left ventricular systolic dysfunction.   Significant native multivessel CAD with diffuse 85% proximal LAD stenosis with evidence for competitive filling via the LIMA graft supplying the mid LAD; 75-80% diffuse proximal ramus intermediate stenosis with evidence for an occluded  vein graft anastomosed in its midst portion of the vessel; 40% left circumflex stenosis, and 95% proximal RCA stenosis which supplies 2 moderate size marginal branches prior to being totally occluded in the region of the acute margin/distal RCA.   Severely diseased SVG which supplies a large distal RCA system giving rise to several branches and anastomosing into the PDA.  The native distal RCA is occluded a short distance prior to the PDA takeoff and the graft does not supply the proximal - mid native vessel due to this occlusion.  Occluded SVG at its origin to the ramus intermediate vessel.   Patent LIMA graft supplying the mid LAD.   Successful percutaneous coronary intervention of the proximal to mid vein graft supplying the large distal RCA with ultimate insertion of a 2.7538  mm Synergy DES stent postdilated to 2.91 mm with the 95 and diffuse 70% stenosis being reduced to 0%.   Successful percutaneous coronary intervention to the proximal native RCA which supplies 2 moderate size marginal branches prior to the acute margin/ distal RCA occlusion with PTCA/ 3.020 mm Synergy DES stent postdilated to 3.25 mm with the 95% stenosis being reduced to 0%.   RECOMMENDATION: [name] has a history of rash secondary to Plavix.  She will be treated with aspirin/Brilinta forward DAPT therapy.  She had experienced continued anginal type symptoms, although mild, despite her bypass surgery which may have been due to a proximal RCA.  Subsequently she has developed unstable angina leading to her non-STEMI.  Following successful intervention, the SVG to her large distal RCA is now wide open as is her proximal native RCA which supplies two marginal branches which were not supplied by the SVG graft.  If the patient continues to experience increasing symptoms of chest pain consider PCI to the ramus intermediate vessel since the graft that supplied this is occluded.  Diagnostic Dominance: Right Intervention  Implants     Permanent Stent  Stent Synergy Des 2.75x38 - [id number] - Implanted  Stent Synergy Des 3x20 - [id number] - Implanted    EKG:  EKG is ordered today.  Personally reviewed, it shows atrial paced, ventricular sensed rhythm.  There are no Q waves in the inferior leads.  There is a chronic QS pattern in leads V1-V2.  There is also chronic ST segment depression in V5-V6.  QTc 500 ms (on dofetilide).  Recent Labs: 06/11/2020: ALT 13 10/04/2020: Hemoglobin 15.0; Platelets 307 10/21/2020: BUN 20; Creatinine, Ser 1.14; Magnesium 2.3; NT-Pro BNP 2,967; Potassium 4.8; Sodium 140   Lipid Panel    Component Value Date/Time   CHOL 202 (H) 06/11/2020 0952   TRIG 177 (H) 06/11/2020 0952   HDL 89 06/11/2020 0952   CHOLHDL 2.3 06/11/2020 0952   CHOLHDL 2.5 05/12/2015 0324   VLDL 23  05/12/2015 0324   LDLCALC 84 06/11/2020 0952    ASSESSMENT:    1. Coronary artery disease of bypass graft of native heart with stable angina pectoris ([place])   2. PAF (paroxysmal atrial fibrillation) ([place])   3. Encounter for monitoring dofetilide therapy   4. SSS (sick sinus syndrome) ([place])   5. Pacemaker   6. Chronic combined systolic and diastolic heart failure ([place])   7. PAD (peripheral artery disease) ([place])   8. Renal artery stenosis ([place])   9. Cerebrovascular small vessel disease   10. Essential hypertension   11. Hypercholesterolemia   12. Nonrheumatic tricuspid valve regurgitation   13. Pre-op testing       PLAN:  In order of problems listed above:  CAD: [name] is having problems with chest discomfort, mostly with exertion and has evidence of inferior wall hypokinesis despite viability on thallium study.  She had early failure of the SVG to RCA in 2017, after her bypass surgery.  She subsequently underwent stenting both of the native right coronary artery and of the saphenous vein graft.  It appears likely that 1 or both of these conduits are now occluded/stenotic.  We discussed the benefits of coronary angiography.  If viability is maintained due to the presence  of residual flow through the right coronary artery, she might benefit from PCI-stent. The saphenous vein graft had very early onset of deterioration requiring proximal stents only 3 months after the bypass surgery and by now is probably occluded.  If inferior wall viability is maintained only via collaterals, only medical therapy will be an option, but this is unlikely to lead to improvement in LV function.   It appears that she has had some benefit from antianginal treatment with isosorbide mononitrate, which was added to chronic treatment with carvedilol and amlodipine a few weeks ago.  We discussed the risks and benefits of cardiac catheterization and possible percutaneous intervention/stent in detail today.  [name]'s best  friend, who is a retired Marine scientist and her daughter were present for this discussion.  We reviewed the possibility that we may find a chronic total occlusion and that this may or may not be amenable to revascularization.  Also reviewed the risks of complications with cardiac catheterization in view of her extensive vascular history and recently labile renal function.  Discussed the possibility of contrast nephrotoxicity and even the need for hemodialysis, but I am heartened by the recent improvement in her renal function parameters. This procedure has been fully reviewed with the patient and written informed consent has been obtained.   AFib: Excellent response to dofetilide, without any atrial fibrillation detected in well over 12 months.  She has had clipping over left atrial appendage and is not on anticoagulation.  She does take aspirin and clopidogrel for PAD with stents in both lower extremities (most recently in 2020.   Dofetilide: Dose of dofetilide decreased after reduction in kidney function with current QTC at upper limit of appropriate range.  She has had none excellent clinical response with marked reduction in the burden of atrial fibrillation. SSS: She has had virtually 100% atrial paced, ventricular sensed rhythm and the lower limit of the atrial rate is set slightly high, since this appears to have helped to avoid recurrence of atrial fibrillation.  Underlying rhythm is sinus bradycardia at about 45 bpm, she is not pacemaker dependent. PPM: Normal pacemaker function.  Continue remote downloads every 3 months. CHF: Although her echo showed evidence of elevated mean left atrial pressure, she does not have overt hypervolemia on physical exam.  She has a tendency to develop hyperkalemia, especially when on RAAS inhibitors.  In the past she had a relatively low blood pressure and we discontinue the tiny dose of valsartan that she was taking.  Unsure how this may relate to the occlusion of the right  renal artery (maybe there is some stenosis in the left renal artery as well). PAD: Denies claudication.  Improved symptoms following her last revascularization procedure in August 2021.  Despite restenosis on one side, she has ABI 1.0 bilaterally and does not have intermittent claudication.  On dual antiplatelet therapy.  Also noted that she has lower blood pressure in the left arm suggesting left subclavian stenosis.  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 ASCVD: No stenoses in the extracranial carotid arteries.  She has mostly small vessel disease and the plan is for her to remain on clopidogrel indefinitely.  Avoid hypotension.  Allow SBP around 140 (that is why we  stopped her valsartan) . HTN: Blood pressure is a little high today, but on the average has been on the low end recently.  We we had to stop her valsartan.  Have been using beta-blockers, calcium channel blockers, nitrates for their antianginal benefit, but may need to revise her regimen based on the lower ejection fraction.  Note that she has lower blood pressure in the left arm and should always check blood pressure on the right. Avoid ACE inhibitors due to history of hyperkalemia while taking lisinopril. Hypercholesterolemia: Previously reported intolerance to atorvastatin and to rosuvastatin (muscle aches and hair loss).  Recently, she appears to be tolerating rosuvastatin 10 mg once daily, but on this regimen her LDL cholesterol was still high at 84 and her rosuvastatin dose was increased to 20 mg daily recently.  Plan to recheck a lipid profile in another couple of months.  If she does not tolerate this would switch to /add Repatha.  She  does have an excellent HDL at 89, but has also had progression of disease with recent occlusion of the right renal artery and probable loss of the SVG-RCA. TR: moderate to severe on TEE 2017 before SVG-RCA stent, mild in 2018, moderate 2021, now moderate-to-severe. Partly pacemaker lead related, but quite possibly it is worse due to RV ischemia. 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  Normal calcium levels by most recent labs performed November 2021.     Shared Decision Making/Informed Consent The risks [stroke (1 in 1000), death (1 in 1000), kidney failure [usually temporary] (1 in 500), bleeding (1 in 200), allergic reaction [possibly serious] (1 in 200)], benefits (diagnostic support and management of coronary artery disease) and alternatives of a cardiac catheterization were discussed in detail with [name] and she is willing to proceed.      Medication Adjustments/Labs and Tests Ordered: Current medicines are reviewed at length with the patient today.  Concerns regarding medicines are outlined above.  Medication changes, Labs and Tests ordered today are listed in the Patient Instructions below. Patient Instructions  Medication Instructions:  No changes  Testing/Procedures: Your physician has requested that you have a cardiac catheterization. Cardiac catheterization is used to diagnose and/or treat various heart conditions. Doctors may recommend this procedure for a number of different reasons. The most common reason is to evaluate chest pain. Chest pain can be a symptom of coronary artery disease (CAD), and cardiac catheterization can show whether plaque is narrowing or blocking your heart's arteries. This procedure is also used to evaluate the valves, as well as measure the blood flow and oxygen levels in different parts of your heart. For further information please visit [email or web address]. Please follow instruction sheet, as given.  Follow-Up: At  [facility], you and your health needs are our priority.  As part of our continuing mission to provide you with exceptional heart care, we have created designated Provider Care Teams.  These Care Teams include your primary Cardiologist (physician) and Advanced Practice Providers (APPs -  Physician Assistants and Nurse Practitioners) who all work together to provide you with the care you need, when you need it.  We recommend signing up for the patient portal called "MyChart".  Sign up information is provided on this After Visit Summary.  MyChart is used to connect with patients for Virtual Visits (Telemedicine).  Patients are able to view lab/test results, encounter notes, upcoming appointments, etc.  Non-urgent messages can be sent to your provider as well.   To learn more about what  you can do with MyChart, go to [email or web address].    Your next appointment:   3 month(s)  The format for your next appointment:   In Person  Provider:   Sanda Klein, MD   Other Instructions  [address] [place] [address] Dept: [phone] Loc: [phone]  [name]  11/01/2020  You are scheduled for a Cardiac Catheterization on Thursday, September 29 with Dr. Peter Martinique.  1. Please arrive at the [facility] (Main Entrance A) at [facility]: [address] [place], [place] [address] at 5:30 AM (This time is 4 hours before your procedure to ensure your preparation). Free valet parking service is available.   Special note: Every effort is made to have your procedure done on time. Please understand that emergencies sometimes delay scheduled procedures.  2. Diet: Do not eat solid foods after midnight.  The patient may have clear liquids until 5am upon the day of the procedure.  3. Labs: You will need to have blood drawn on Monday 9/26. You may have this done at our office or  any [facility]. You do not need an appointment or need to be fasting. The lab opens at 8 am.  4. Medication instructions in preparation for your procedure: Hold the Furosemide the morning of the procedure  On the morning of your procedure, take your Aspirin and any morning medicines NOT listed above.  You may use sips of water.  5. Plan for one night stay--bring personal belongings. 6. Bring a current list of your medications and current insurance cards. 7. You MUST have a responsible person to drive you home. 8. Someone MUST be with you the first 24 hours after you arrive home or your discharge will be delayed. 9. Please wear clothes that are easy to get on and off and wear slip-on shoes.  Thank you for allowing [place] to care for you!   -- [facility] Invasive Cardiovascular services    Signed, Sanda Klein, MD  11/02/2020 4:34 AM    [address], [place], [place]  [address] Phone: [phone]; Fax: [phone]

## 2020-11-05 DIAGNOSIS — I25708 Atherosclerosis of coronary artery bypass graft(s), unspecified, with other forms of angina pectoris: Secondary | ICD-10-CM | POA: Diagnosis not present

## 2020-11-05 DIAGNOSIS — Z01818 Encounter for other preprocedural examination: Secondary | ICD-10-CM | POA: Diagnosis not present

## 2020-11-06 ENCOUNTER — Telehealth: Payer: Self-pay | Admitting: *Deleted

## 2020-11-06 LAB — CBC
Hematocrit: 44.5 % (ref 34.0–46.6)
Hemoglobin: 15.1 g/dL (ref 11.1–15.9)
MCH: 29.4 pg (ref 26.6–33.0)
MCHC: 33.9 g/dL (ref 31.5–35.7)
MCV: 87 fL (ref 79–97)
Platelets: 273 10*3/uL (ref 150–450)
RBC: 5.13 x10E6/uL (ref 3.77–5.28)
RDW: 12.7 % (ref 11.7–15.4)
WBC: 8.3 10*3/uL (ref 3.4–10.8)

## 2020-11-06 LAB — BASIC METABOLIC PANEL
BUN/Creatinine Ratio: 20 (ref 12–28)
BUN: 34 mg/dL — ABNORMAL HIGH (ref 8–27)
CO2: 28 mmol/L (ref 20–29)
Calcium: 10.2 mg/dL (ref 8.7–10.3)
Chloride: 88 mmol/L — ABNORMAL LOW (ref 96–106)
Creatinine, Ser: 1.72 mg/dL — ABNORMAL HIGH (ref 0.57–1.00)
Glucose: 107 mg/dL — ABNORMAL HIGH (ref 70–99)
Potassium: 4.9 mmol/L (ref 3.5–5.2)
Sodium: 134 mmol/L (ref 134–144)
eGFR: 30 mL/min/{1.73_m2} — ABNORMAL LOW (ref >59)

## 2020-11-06 NOTE — Telephone Encounter (Addendum)
Cardiac catheterization scheduled at [facility] for: Thursday November 07, 2020 [facility] Main Entrance A [facility]) at: 5:30 AM-pre-procedure hydration   No solid food after midnight prior to cath, clear liquids until 5 AM day of procedure.  Medication instructions: Hold: Lasix-day before and day of procedure-GFR 30  Except hold medications usual morning medications can be taken pre-cath with sips of water including: - aspirin 81 mg -Plavix 75 mg    Confirmed patient has responsible adult to drive home post procedure and be with patient first 24 hours after arriving home.  [facility] does allow one visitor to accompany you and wait in the hospital waiting room while you are there for your procedure. You and your visitor will be asked to wear a mask once you enter the hospital.   Patient reports does not currently have any symptoms concerning for COVID-19 and no household members with COVID-19 like illness.   Reviewed procedure/mask/visitor instructions with patient.                 Patient will bring her morning dose Tikosyn with her to hospital to take at hospital 11/07/20 at 8:30-9 AM.

## 2020-11-07 ENCOUNTER — Ambulatory Visit (HOSPITAL_COMMUNITY)
Admission: RE | Disposition: A | Discharge status: Home / Self Care | Payer: Self-pay | Source: Home / Self Care | Attending: Cardiology

## 2020-11-07 ENCOUNTER — Ambulatory Visit (HOSPITAL_COMMUNITY)
Admission: RE | Admit: 2020-11-07 | Discharge: 2020-11-07 | Disposition: A | Discharge status: Home / Self Care | Payer: Medicare HMO | Attending: Cardiology | Admitting: Cardiology

## 2020-11-07 ENCOUNTER — Other Ambulatory Visit: Payer: Self-pay

## 2020-11-07 ENCOUNTER — Other Ambulatory Visit: Payer: Self-pay | Admitting: Physician Assistant

## 2020-11-07 DIAGNOSIS — I361 Nonrheumatic tricuspid (valve) insufficiency: Secondary | ICD-10-CM | POA: Diagnosis not present

## 2020-11-07 DIAGNOSIS — Z8616 Personal history of COVID-19: Secondary | ICD-10-CM | POA: Insufficient documentation

## 2020-11-07 DIAGNOSIS — I701 Atherosclerosis of renal artery: Secondary | ICD-10-CM | POA: Insufficient documentation

## 2020-11-07 DIAGNOSIS — Z7982 Long term (current) use of aspirin: Secondary | ICD-10-CM | POA: Diagnosis not present

## 2020-11-07 DIAGNOSIS — Y832 Surgical operation with anastomosis, bypass or graft as the cause of abnormal reaction of the patient, or of later complication, without mention of misadventure at the time of the procedure: Secondary | ICD-10-CM | POA: Diagnosis not present

## 2020-11-07 DIAGNOSIS — I739 Peripheral vascular disease, unspecified: Secondary | ICD-10-CM | POA: Insufficient documentation

## 2020-11-07 DIAGNOSIS — Z79890 Hormone replacement therapy (postmenopausal): Secondary | ICD-10-CM | POA: Insufficient documentation

## 2020-11-07 DIAGNOSIS — E7800 Pure hypercholesterolemia, unspecified: Secondary | ICD-10-CM | POA: Insufficient documentation

## 2020-11-07 DIAGNOSIS — I110 Hypertensive heart disease with heart failure: Secondary | ICD-10-CM | POA: Insufficient documentation

## 2020-11-07 DIAGNOSIS — I208 Other forms of angina pectoris: Secondary | ICD-10-CM

## 2020-11-07 DIAGNOSIS — Z888 Allergy status to other drugs, medicaments and biological substances status: Secondary | ICD-10-CM | POA: Diagnosis not present

## 2020-11-07 DIAGNOSIS — I495 Sick sinus syndrome: Secondary | ICD-10-CM | POA: Insufficient documentation

## 2020-11-07 DIAGNOSIS — T82858A Stenosis of vascular prosthetic devices, implants and grafts, initial encounter: Secondary | ICD-10-CM | POA: Insufficient documentation

## 2020-11-07 DIAGNOSIS — Z87891 Personal history of nicotine dependence: Secondary | ICD-10-CM | POA: Diagnosis not present

## 2020-11-07 DIAGNOSIS — I2089 Other forms of angina pectoris: Secondary | ICD-10-CM

## 2020-11-07 DIAGNOSIS — I25118 Atherosclerotic heart disease of native coronary artery with other forms of angina pectoris: Secondary | ICD-10-CM

## 2020-11-07 DIAGNOSIS — I25718 Atherosclerosis of autologous vein coronary artery bypass graft(s) with other forms of angina pectoris: Secondary | ICD-10-CM

## 2020-11-07 DIAGNOSIS — Z79899 Other long term (current) drug therapy: Secondary | ICD-10-CM | POA: Diagnosis not present

## 2020-11-07 DIAGNOSIS — Z8249 Family history of ischemic heart disease and other diseases of the circulatory system: Secondary | ICD-10-CM | POA: Diagnosis not present

## 2020-11-07 DIAGNOSIS — E785 Hyperlipidemia, unspecified: Secondary | ICD-10-CM | POA: Diagnosis not present

## 2020-11-07 DIAGNOSIS — Z7902 Long term (current) use of antithrombotics/antiplatelets: Secondary | ICD-10-CM | POA: Diagnosis not present

## 2020-11-07 DIAGNOSIS — I480 Paroxysmal atrial fibrillation: Secondary | ICD-10-CM | POA: Insufficient documentation

## 2020-11-07 DIAGNOSIS — Z955 Presence of coronary angioplasty implant and graft: Secondary | ICD-10-CM | POA: Insufficient documentation

## 2020-11-07 DIAGNOSIS — I5042 Chronic combined systolic (congestive) and diastolic (congestive) heart failure: Secondary | ICD-10-CM | POA: Insufficient documentation

## 2020-11-07 HISTORY — PX: CORONARY STENT INTERVENTION: CATH118234

## 2020-11-07 HISTORY — PX: LEFT HEART CATH AND CORS/GRAFTS ANGIOGRAPHY: CATH118250

## 2020-11-07 LAB — POCT ACTIVATED CLOTTING TIME: Activated Clotting Time: 248 seconds

## 2020-11-07 SURGERY — LEFT HEART CATH AND CORS/GRAFTS ANGIOGRAPHY
Anesthesia: LOCAL

## 2020-11-07 MED ORDER — ROSUVASTATIN CALCIUM 40 MG PO TABS: 40.0000 mg | ORAL_TABLET | Freq: Every day | ORAL | 3 refills | Status: DC

## 2020-11-07 MED ORDER — LIDOCAINE HCL (PF) 1 % IJ SOLN
INTRAMUSCULAR | Status: AC
  Filled 2020-11-07: qty 30

## 2020-11-07 MED ORDER — SODIUM CHLORIDE 0.9 % IV SOLN: 250.0000 mL | INTRAVENOUS | Status: DC | PRN

## 2020-11-07 MED ORDER — VERAPAMIL HCL 2.5 MG/ML IV SOLN
INTRAVENOUS | Status: AC
  Filled 2020-11-07: qty 2

## 2020-11-07 MED ORDER — MIDAZOLAM HCL 2 MG/2ML IJ SOLN
INTRAMUSCULAR | Status: DC | PRN
  Administered 2020-11-07: 1 mg via INTRAVENOUS

## 2020-11-07 MED ORDER — SODIUM CHLORIDE 0.9% FLUSH: 3.0000 mL | Freq: Two times a day (BID) | INTRAVENOUS | Status: DC

## 2020-11-07 MED ORDER — VERAPAMIL HCL 2.5 MG/ML IV SOLN
INTRAVENOUS | Status: DC | PRN
  Administered 2020-11-07: 10 mL via INTRA_ARTERIAL

## 2020-11-07 MED ORDER — LABETALOL HCL 5 MG/ML IV SOLN: 10.0000 mg | INTRAVENOUS | Status: DC | PRN

## 2020-11-07 MED ORDER — HEPARIN (PORCINE) IN NACL 1000-0.9 UT/500ML-% IV SOLN
INTRAVENOUS | Status: DC | PRN
  Administered 2020-11-07 (×2): 500 mL

## 2020-11-07 MED ORDER — SODIUM CHLORIDE 0.9% FLUSH: 3.0000 mL | INTRAVENOUS | Status: DC | PRN

## 2020-11-07 MED ORDER — SODIUM CHLORIDE 0.9 % WEIGHT BASED INFUSION
3.0000 mL/kg/h | INTRAVENOUS | Status: AC
  Administered 2020-11-07: 3 mL/kg/h via INTRAVENOUS

## 2020-11-07 MED ORDER — SODIUM CHLORIDE 0.9 % IV SOLN: INTRAVENOUS | Status: DC

## 2020-11-07 MED ORDER — HYDRALAZINE HCL 20 MG/ML IJ SOLN
INTRAMUSCULAR | Status: AC
  Filled 2020-11-07: qty 1

## 2020-11-07 MED ORDER — ASPIRIN 81 MG PO CHEW: 81.0000 mg | CHEWABLE_TABLET | ORAL | Status: DC

## 2020-11-07 MED ORDER — ONDANSETRON HCL 4 MG/2ML IJ SOLN: 4.0000 mg | Freq: Four times a day (QID) | INTRAMUSCULAR | Status: DC | PRN

## 2020-11-07 MED ORDER — LIDOCAINE HCL (PF) 1 % IJ SOLN
INTRAMUSCULAR | Status: DC | PRN
  Administered 2020-11-07: 2 mL

## 2020-11-07 MED ORDER — MIDAZOLAM HCL 2 MG/2ML IJ SOLN
INTRAMUSCULAR | Status: AC
  Filled 2020-11-07: qty 2

## 2020-11-07 MED ORDER — IOHEXOL 350 MG/ML SOLN
INTRAVENOUS | Status: DC | PRN
  Administered 2020-11-07: 85 mL

## 2020-11-07 MED ORDER — HEPARIN (PORCINE) IN NACL 1000-0.9 UT/500ML-% IV SOLN
INTRAVENOUS | Status: AC
  Filled 2020-11-07: qty 1000

## 2020-11-07 MED ORDER — HYDRALAZINE HCL 20 MG/ML IJ SOLN: 10.0000 mg | INTRAMUSCULAR | Status: DC | PRN

## 2020-11-07 MED ORDER — CLOPIDOGREL BISULFATE 75 MG PO TABS: 75.0000 mg | ORAL_TABLET | ORAL | Status: DC

## 2020-11-07 MED ORDER — SODIUM CHLORIDE 0.9 % WEIGHT BASED INFUSION: 1.0000 mL/kg/h | INTRAVENOUS | Status: DC

## 2020-11-07 MED ORDER — HYDRALAZINE HCL 20 MG/ML IJ SOLN
INTRAMUSCULAR | Status: DC | PRN
  Administered 2020-11-07: 10 mg via INTRAVENOUS

## 2020-11-07 MED ORDER — FENTANYL CITRATE (PF) 100 MCG/2ML IJ SOLN
INTRAMUSCULAR | Status: DC | PRN
  Administered 2020-11-07: 25 ug via INTRAVENOUS

## 2020-11-07 MED ORDER — HEPARIN SODIUM (PORCINE) 1000 UNIT/ML IJ SOLN
INTRAMUSCULAR | Status: DC | PRN
  Administered 2020-11-07: 2000 [IU] via INTRAVENOUS
  Administered 2020-11-07: 5000 [IU] via INTRAVENOUS

## 2020-11-07 MED ORDER — HEPARIN SODIUM (PORCINE) 1000 UNIT/ML IJ SOLN
INTRAMUSCULAR | Status: AC
  Filled 2020-11-07: qty 1

## 2020-11-07 MED ORDER — NITROGLYCERIN 1 MG/10 ML FOR IR/CATH LAB
INTRA_ARTERIAL | Status: AC
  Filled 2020-11-07: qty 10

## 2020-11-07 MED ORDER — ACETAMINOPHEN 325 MG PO TABS: 650.0000 mg | ORAL_TABLET | ORAL | Status: DC | PRN

## 2020-11-07 MED ORDER — FENTANYL CITRATE (PF) 100 MCG/2ML IJ SOLN
INTRAMUSCULAR | Status: AC
  Filled 2020-11-07: qty 2

## 2020-11-07 SURGICAL SUPPLY — 20 items
BALLN SAPPHIRE 3.0X12 (BALLOONS) ×2
BALLN ~~LOC~~ EUPHORA RX 3.5X12 (BALLOONS) ×2
BALLOON SAPPHIRE 3.0X12 (BALLOONS) ×1 IMPLANT
BALLOON ~~LOC~~ EUPHORA RX 3.5X12 (BALLOONS) ×1 IMPLANT
CATH EXPO 5F MPA-1 (CATHETERS) ×2 IMPLANT
CATH INFINITI 5 FR IM (CATHETERS) ×2 IMPLANT
CATH INFINITI 5 FR JL3.5 (CATHETERS) ×2 IMPLANT
CATH INFINITI 5FR MULTPACK ANG (CATHETERS) ×2 IMPLANT
CATH VISTA GUIDE 6FR MPA1 (CATHETERS) ×2 IMPLANT
DEVICE RAD COMP TR BAND LRG (VASCULAR PRODUCTS) ×2 IMPLANT
GLIDESHEATH SLEND SS 6F .021 (SHEATH) ×2 IMPLANT
GUIDEWIRE INQWIRE 1.5J.035X260 (WIRE) ×1 IMPLANT
INQWIRE 1.5J .035X260CM (WIRE) ×2
KIT ENCORE 26 ADVANTAGE (KITS) ×4 IMPLANT
KIT HEART LEFT (KITS) ×2 IMPLANT
PACK CARDIAC CATHETERIZATION (CUSTOM PROCEDURE TRAY) ×2 IMPLANT
STENT ONYX FRONTIER 3.0X18 (Permanent Stent) ×2 IMPLANT
TRANSDUCER W/STOPCOCK (MISCELLANEOUS) ×2 IMPLANT
TUBING CIL FLEX 10 FLL-RA (TUBING) ×2 IMPLANT
WIRE ASAHI SION 190X3X12 .014 (WIRE) ×2 IMPLANT

## 2020-11-07 NOTE — Progress Notes (Signed)
CARDIAC REHAB PHASE I   Stent education completed with pt. Pt educated on importance of ASA and Plavix. Pt given heart healthy diet. Reviewed site care, restrictions, and exercise guidelines. Will refer to CRP II [place]. Pt thankful for her team, and hope to feel better soon.  [id number] Rufina Falco, RN BSN 11/07/2020 1:41 PM

## 2020-11-07 NOTE — Discharge Summary (Signed)
Discharge Summary for Same Day PCI   Patient ID: [name] MRN: [id number]; DOB: 04-11-1940  Admit date: 11/07/2020 Discharge date: 11/07/2020  Primary Care Provider: Horald Pollen, MD  Primary Cardiologist: Sanda Klein, MD  Primary Electrophysiologist:  None   Discharge Diagnoses    Principal Problem:   Exertional angina [facility]) Active Problems:   Hyperlipidemia    Diagnostic Studies/Procedures    Cardiac Catheterization 11/07/2020:  Ramus lesion is 65% stenosed.   Prox LAD lesion is 80% stenosed.   Mid LAD lesion is 100% stenosed.   Dist RCA lesion is 100% stenosed.   Origin lesion is 100% stenosed.   Origin to Prox Graft lesion is 99% stenosed.   Previously placed Prox RCA stent (unknown type) is  widely patent.   A drug-eluting stent was successfully placed using a STENT ONYX FRONTIER 3.0X18.   Post intervention, there is a 0% residual stenosis.   LIMA graft was visualized by angiography and is normal in caliber.   SVG graft was not injected.   The graft exhibits no disease.   LV end diastolic pressure is normal.    High grade in-stent restenosis in vein graft to PDA treated with one drug-eluting stent. Moderate unchanged disease of unrevascularized ramus intermedius branch. Patent LIMA to LAD with known occlusion of vein graft to ramus intermedius. Normal LVEDP.   Recommendations:  Continue DAPT and aggressive cardiovascular risk factor modification.  Anticipate same day discharge if criteria are met.  Diagnostic Dominance: Right Intervention   _____________   History of Present Illness     [name] is a 80 y.o. female with coronary artery disease s/p bypass surgery (November 2016) and placement of drug-eluting stents (DES to distal SVG-RCA, April 2017 ) and left atrial appendage clipping, symptomatic paroxysmal atrial fibrillation and atrial flutter with radiofrequency ablation (cavotricuspid isthmus and pulmonary vein isolation), PAD  with previous lower extremity stents, extensive intracranial atherosclerotic disease, history of GI bleeding while on anticoagulation, volatile hypertension.   She was recently seen in the office and continue to have problems with exertional chest discomfort and exertional dyspnea for the last several months, NYHA functional class II.  At rest, she did not usually have dyspnea or chest tightness, but does have some lingering vague discomfort at times.  She denies palpitations, syncope or edema, orthopnea or PND.   An echocardiogram that we performed 09/24/2020 showed a new inferior wall motion abnormality and a reduction in LV ejection fraction to 35-40%, as well as evidence of elevated mean left atrial pressure.  Previous assessment by echo in 08/10/2019 showed normal LV wall motion and ejection fraction, although with evidence of diastolic dysfunction.  The ECG did not show Q waves in the inferior leads (does have a chronic QS pattern in leads V1 and V2) and did not show any acute ST-T changes.  Recommended cardiac MRI for viability assessment, but her Medtronic Adapta pacemaker is not officially MRI conditional.  Performed a thallium viability study 10/28/2020 that showed viable myocardium throughout without any evidence of previous infarction. Given concern for unstable angina, outpatient cardiac cath was arranged as an outpatient.   Hospital Course     The patient underwent cardiac cath as noted above with high grade ISR in the SVG-PDA with PCI/DES x1. Patent LIMA-LAD with known occlusion fo SVG-RI. Normal LVEDP. Plan for DAPT with ASA/plavix for at least at one year. The patient was seen by cardiac rehab while in short stay. There were no observed complications post cath.  Radial cath site was re-evaluated prior to discharge and found to be stable without any complications. Instructions/precautions regarding cath site care were given prior to discharge. Discussed with pt regarding increase in crestor  from 60m to 48mdaily, which she was agreeable to.   [name] Voganas seen by Dr. ThAli Lowend determined stable for discharge home. Follow up with our office has been arranged. Medications are listed below. Pertinent changes include increase in Crestor from 20 to 4069maily.  _____________  Cath/PCI Registry Performance & Quality Measures: Aspirin prescribed? - Yes ADP Receptor Inhibitor (Plavix/Clopidogrel, Brilinta/Ticagrelor or Effient/Prasugrel) prescribed (includes medically managed patients)? - Yes High Intensity Statin (Lipitor 40-76m35m Crestor 20-40mg20mescribed? - Yes For EF <40%, was ACEI/ARB prescribed? - No - Reason:  has been stopped in the past 2/2 hypotension. Consider resuming at outpatient follow up if appropriate For EF <40%, Aldosterone Antagonist (Spironolactone or Eplerenone) prescribed? - No - Reason:  issues with hyperkalemia in the past. Cardiac Rehab Phase II ordered (Included Medically managed Patients)? - Yes  _____________   Discharge Vitals Blood pressure (!) 164/70, pulse 74, temperature 98.4 F (36.9 C), temperature source Oral, resp. rate 20, height 5' 3.5" (1.613 m), weight 65.8 kg, SpO2 96 %.  Filed Weights   11/07/20 0614  Weight: 65.8 kg    Last Labs & Radiologic Studies    CBC Recent Labs    11/05/20 1539  WBC 8.3  HGB 15.1  HCT 44.5  MCV 87  PLT 273  335sic Metabolic Panel Recent Labs    11/05/20 1539  NA 134  K 4.9  CL 88*  CO2 28  GLUCOSE 107*  BUN 34*  CREATININE 1.72*  CALCIUM 10.2   Liver Function Tests No results for input(s): AST, ALT, ALKPHOS, BILITOT, PROT, ALBUMIN in the last 72 hours. No results for input(s): LIPASE, AMYLASE in the last 72 hours. High Sensitivity Troponin:   No results for input(s): TROPONINIHS in the last 720 hours.  BNP Invalid input(s): POCBNP D-Dimer No results for input(s): DDIMER in the last 72 hours. Hemoglobin A1C No results for input(s): HGBA1C in the last 72  hours. Fasting Lipid Panel No results for input(s): CHOL, HDL, LDLCALC, TRIG, CHOLHDL, LDLDIRECT in the last 72 hours. Thyroid Function Tests No results for input(s): TSH, T4TOTAL, T3FREE, THYROIDAB in the last 72 hours.  Invalid input(s): FREET3 _____________  THALLIUM 201 VIABILITY STUDY  Result Date: 10/28/2020   Prior study available for comparison. Performed at [facility].  Negative per patient.  No results available for review   The rest and delayed thallium study shows viable myocardium in all segments of the LV   There is no evidence of previous myocardial infarction .   CARDIAC CATHETERIZATION  Result Date: 11/07/2020   Ramus lesion is 65% stenosed.   Prox LAD lesion is 80% stenosed.   Mid LAD lesion is 100% stenosed.   Dist RCA lesion is 100% stenosed.   Origin lesion is 100% stenosed.   Origin to Prox Graft lesion is 99% stenosed.   Previously placed Prox RCA stent (unknown type) is  widely patent.   A drug-eluting stent was successfully placed using a STENT ONYX FRONTIER 3.0X18.   Post intervention, there is a 0% residual stenosis.   LIMA graft was visualized by angiography and is normal in caliber.   SVG graft was not injected.   The graft exhibits no disease.   LV end diastolic pressure is normal.  High grade in-stent restenosis in  vein graft to PDA treated with one drug-eluting stent. Moderate unchanged disease of unrevascularized ramus intermedius branch. Patent LIMA to LAD with known occlusion of vein graft to ramus intermedius. Normal LVEDP. Recommendations:  Continue DAPT and aggressive cardiovascular risk factor modification.  Anticipate same day discharge if criteria are met.    Disposition   Pt is being discharged home today in good condition.  Follow-up Plans & Appointments     Follow-up Information     Lendon Colonel, NP Follow up on 11/18/2020.   Specialties: Nurse Practitioner, Radiology, Cardiology Why: at 10:15am for your follow up appt Contact  information: [address] [place] [address] [phone]                Discharge Instructions     Amb Referral to Cardiac Rehabilitation   Complete by: As directed    Diagnosis: Coronary Stents   After initial evaluation and assessments completed: Virtual Based Care may be provided alone or in conjunction with Phase 2 Cardiac Rehab based on patient barriers.: Yes        Discharge Medications   Allergies as of 11/07/2020       Reactions   Brilinta [ticagrelor] Shortness Of Breath   Atorvastatin Other (See Comments)   Possible cause of fatigue/malaise   Clonidine Derivatives Other (See Comments)   Lowers heart rate   Crestor [rosuvastatin Calcium] Other (See Comments)   Joint/muscle aches   Exforge [amlodipine Besylate-valsartan] Itching, Rash   Spironolactone Other (See Comments)   Contraindicated with history of hyperkalemia        Medication List     TAKE these medications    amLODipine 5 MG tablet Commonly known as: NORVASC Take 1 tablet (5 mg total) by mouth in the morning and at bedtime.   ascorbic acid 250 MG Chew Commonly known as: VITAMIN C Chew 750 mg by mouth daily.   aspirin EC 81 MG tablet Take 81 mg by mouth daily.   B-12 5000 MCG Caps Take 10,000 mcg by mouth daily.   Biotin 10000 MCG Tabs Take 10,000 mcg by mouth daily.   carvedilol 6.25 MG tablet Commonly known as: COREG TAKE 1 TABLET BY MOUTH 2 TIMES DAILY WITH A MEAL.   citalopram 40 MG tablet Commonly known as: CELEXA Take 1 tablet (40 mg total) by mouth daily.   clopidogrel 75 MG tablet Commonly known as: PLAVIX TAKE 1 TABLET (75 MG TOTAL) BY MOUTH DAILY WITH BREAKFAST.   dofetilide 125 MCG capsule Commonly known as: TIKOSYN TAKE 1 CAPSULE  BY MOUTH EVERY 12 HOURS. (GENERIC FOR TIKOSYN)   furosemide 80 MG tablet Commonly known as: LASIX Take 1 tablet (80 mg total) by mouth daily.   isosorbide mononitrate 30 MG 24 hr tablet Commonly known as:  IMDUR Take 1 tablet (30 mg total) by mouth daily.   levothyroxine 25 MCG tablet Commonly known as: SYNTHROID Take 25 mcg by mouth daily before breakfast.   loteprednol 0.5 % ophthalmic suspension Commonly known as: LOTEMAX Place 2 drops into both eyes See admin instructions. Instill 2 drops into both eyes 2 times a day twice weekly   Magnesium 200 MG Tabs Take 200 mg by mouth daily.   multivitamin with minerals Tabs tablet Take 1 tablet by mouth 2 (two) times daily.   nitroGLYCERIN 0.4 MG SL tablet Commonly known as: NITROSTAT Place 1 tablet (0.4 mg total) under the tongue every 5 (five) minutes x 3 doses as needed for chest pain.  rosuvastatin 40 MG tablet Commonly known as: CRESTOR Take 1 tablet (40 mg total) by mouth daily. What changed:  medication strength how much to take   Vitamin D 50 MCG (2000 UT) tablet Take 2,000 Units by mouth daily.   VITAMIN D-VITAMIN K PO Take 1 capsule by mouth daily.   ZINC PO Take 150 mg by mouth daily.           Allergies Allergies  Allergen Reactions   Brilinta [Ticagrelor] Shortness Of Breath   Atorvastatin Other (See Comments)    Possible cause of fatigue/malaise   Clonidine Derivatives Other (See Comments)    Lowers heart rate   Crestor [Rosuvastatin Calcium] Other (See Comments)    Joint/muscle aches   Exforge [Amlodipine Besylate-Valsartan] Itching and Rash   Spironolactone Other (See Comments)    Contraindicated with history of hyperkalemia    Outstanding Labs/Studies   FLP/LFTs in 8 weeks  Duration of Discharge Encounter   Greater than 30 minutes including physician time.  Signed, Reino Bellis, NP 11/07/2020, 3:55 PM

## 2020-11-07 NOTE — Interval H&P Note (Signed)
History and Physical Interval Note:  11/07/2020 10:01 AM  [name]  has presented today for surgery, with the diagnosis of Coronary artery disease.  The various methods of treatment have been discussed with the patient and family. After consideration of risks, benefits and other options for treatment, the patient has consented to  Procedure(s): LEFT HEART CATH AND CORS/GRAFTS ANGIOGRAPHY (N/A) as a surgical intervention.  The patient's history has been reviewed, patient examined, no change in status, stable for surgery.  I have reviewed the patient's chart and labs.  Questions were answered to the patient's satisfaction.    Cath Lab Visit (complete for each Cath Lab visit)  Clinical Evaluation Leading to the Procedure:   ACS: No.  Non-ACS:    Anginal Classification: CCS II  Anti-ischemic medical therapy: Maximal Therapy (2 or more classes of medications)  Non-Invasive Test Results: No non-invasive testing performed  Prior CABG: Previous CABG        Early [place]sm[place]

## 2020-11-07 NOTE — Progress Notes (Signed)
Pt ate light breakfast @0430 , toast 1/2 cup coffee. Dr Martinique aware, procedure to continue,

## 2020-11-08 ENCOUNTER — Encounter (HOSPITAL_COMMUNITY): Payer: Self-pay | Admitting: Internal Medicine

## 2020-11-08 MED FILL — Nitroglycerin IV Soln 100 MCG/ML in D5W: INTRA_ARTERIAL | Qty: 10 | Status: AC

## 2020-11-12 ENCOUNTER — Telehealth (HOSPITAL_COMMUNITY): Payer: Self-pay

## 2020-11-12 ENCOUNTER — Ambulatory Visit (INDEPENDENT_AMBULATORY_CARE_PROVIDER_SITE_OTHER): Payer: Medicare HMO

## 2020-11-12 DIAGNOSIS — I495 Sick sinus syndrome: Secondary | ICD-10-CM | POA: Diagnosis not present

## 2020-11-12 NOTE — Telephone Encounter (Signed)
Pt can afford the $45 copay that she has with her insurance and declined the program. Closed referral.

## 2020-11-12 NOTE — Telephone Encounter (Signed)
Pt insurance is active and benefits verified through [facility] $45, DED 0/0 met, out of pocket $4,500/$1,048.24 met, co-insurance 0%. no pre-authorization required. Passport, 11/12/2020'@4' :17pm, REF# [id number]   Will contact patient to see if she is interested in the Cardiac Rehab Program. If interested, patient will need to complete follow up appt. Once completed, patient will be contacted for scheduling upon review by the RN Navigator.

## 2020-11-13 ENCOUNTER — Other Ambulatory Visit: Payer: Self-pay | Admitting: Cardiovascular Disease

## 2020-11-13 LAB — CUP PACEART REMOTE DEVICE CHECK
Battery Impedance: 1219 Ohm
Battery Remaining Longevity: 48 mo
Battery Voltage: 2.77 V
Brady Statistic AP VP Percent: 1 %
Brady Statistic AP VS Percent: 99 %
Brady Statistic AS VP Percent: 0 %
Brady Statistic AS VS Percent: 0 %
Date Time Interrogation Session: 20221005100300
Implantable Lead Implant Date: 20131212
Implantable Lead Implant Date: 20131212
Implantable Lead Location: 753859
Implantable Lead Location: 753860
Implantable Lead Model: 5076
Implantable Lead Model: 5076
Implantable Pulse Generator Implant Date: 20131212
Lead Channel Impedance Value: 403 Ohm
Lead Channel Impedance Value: 473 Ohm
Lead Channel Pacing Threshold Amplitude: 0.875 V
Lead Channel Pacing Threshold Amplitude: 0.875 V
Lead Channel Pacing Threshold Pulse Width: 0.4 ms
Lead Channel Pacing Threshold Pulse Width: 0.4 ms
Lead Channel Setting Pacing Amplitude: 2 V
Lead Channel Setting Pacing Amplitude: 2.5 V
Lead Channel Setting Pacing Pulse Width: 0.4 ms
Lead Channel Setting Sensing Sensitivity: 5.6 mV

## 2020-11-16 NOTE — Progress Notes (Signed)
Cardiology Office Note   Date:  11/18/2020   ID:  [name], [name] Nov 14, 1940, MRN [id number]  PCP:  Horald Pollen, MD  Cardiologist:  Dr.Croitoru  CC: Post Hospitalization Follow Up.    History of Present Illness: [name] is a 80 y.o. female who presents for posthospitalization follow-up with known history of coronary artery disease status post bypass surgery in November 2016, placement of DES stents to the distal SVG to RCA, in April 2017, she also has a history of left atrial appendage clipping in the setting of symptomatic paroxysmal atrial fibrillation and atrial flutter.  She did  have radiofrequency ablation (cavotricuspid isthmus and pulmonary vein isolation), history of PAD with previous lower extremity stents, extensive intracranial atherosclerotic disease, also history of GI bleeding while on anticoagulation, and volatile hypertension.  She was seen in the office with continued complaints of chest discomfort and exertional shortness of breath over the last several months, classified [place] heart association functional class II.  A follow-up echocardiogram was performed on 09/24/2020 showing a new inferior wall motion abnormality and reduction in her LV systolic function with LVEF of 35 to 40% and evidence of wall motion abnormality.  There was also evidence of elevated mean left atrial pressure.  This was new compared to prior echocardiogram.  She was also noted on her EKG that she was not showing any Q waves in the inferior leads however she does have chronic QS pattern in leads V1 and V2.  EKG did not show any acute ST-T wave changes.    She was therefore recommended for cardiac MRI for viability assessment however Medtronic Adapta pacemaker was not MRI conditional and therefore a thallium viability study was completed on 10/28/2020 which showed viable myocardium throughout without any evidence of previous infarction.  Therefore the patient was admitted for cardiac  catheterization in the setting of unstable angina.  Cardiac catheterization revealed high-grade in-stent restenosis in the SVG to PDA with PCI and DES x1.  Patient did have a patent LIMA to LAD with known occlusion of the SVG to RI.  She was noted to have a normal LVEDP.  She was restarted on dual antiplatelet therapy with aspirin and Plavix and recommended this be continued for at least 1 year.    Past Medical History:  Diagnosis Date   ANXIETY 12/31/2009   Aortic insufficiency    a. mod by echo 2017.   CAD (coronary artery disease)    a. s/p CABGx3 and LAA clipping in 12/2014, NSTEMI 05/2015 s/p DES to native RCA and SVG-dRCA; occluded ramus-SVG was treated medically). c. neg nuc 10/2015 at [facility].   Chronic diastolic CHF (congestive heart failure) (HCC)    Chronic fatigue    CKD (chronic kidney disease), stage II    DEPRESSION 12/31/2009   [place] DISEASE, CERVICAL 12/31/2009   [place] DISEASE, LUMBAR 12/31/2009   DIVERTICULITIS, HX OF 12/31/2009   Essential hypertension    Gait abnormality 03/25/2017   GLUCOSE INTOLERANCE 12/31/2009   Habitual alcohol use    History of stroke    self reports " they say i have had some pin strokes"    Hypercalcemia    Hyperkalemia    Hyperlipidemia 12/31/2009   Hypothyroidism    MENOPAUSE, EARLY 12/31/2009   NSTEMI (non-ST elevated myocardial infarction) ([place]) 05/11/2015   Orthostatic hypotension    OSTEOARTHRITIS, HAND    Pacemaker 01/21/2012   MDT Adapta dual chamber   Paroxysmal atrial flutter ([place])    Persistent atrial fibrillation (  [place])    PVD (peripheral vascular disease), hx stents to bil SFAs 02/2010    Rash, skin     superficial raised red pencil point sized rash bilateral forearms; states " it started when i started the Plavix "    Ruptured left breast implant    Symptomatic sinus bradycardia 01/22/2012   Syncope    a. 11/2015 ? due to medications.   Syncope    reports on 05-10-17 " i passed out 2 months ago in the bathroom and broke some  ribs"     Tachy-brady syndrome ([place])    a. s/p MDT PPM 2013.   Tricuspid regurgitation     Past Surgical History:  Procedure Laterality Date   ABDOMINAL AORTOGRAM W/LOWER EXTREMITY Bilateral 02/15/2017   Procedure: ABDOMINAL AORTOGRAM W/LOWER EXTREMITY;  Surgeon: Lorretta Harp, MD;  Location: [place] [facility];  Service: Cardiovascular;  Laterality: Bilateral;  bilat   ABDOMINAL AORTOGRAM W/LOWER EXTREMITY N/A 10/06/2018   Procedure: ABDOMINAL AORTOGRAM W/LOWER EXTREMITY;  Surgeon: Lorretta Harp, MD;  Location: [place] [facility];  Service: Cardiovascular;  Laterality: N/A;   Ablation of typical atrial flutter (CTI line)  09/05/2016   Dr Antonieta Pert at [place]   Removed 01/2018   BREAST IMPLANT REMOVAL Bilateral 01/14/2018   Procedure: REMOVAL BILATERAL BREAST IMPLANTS;  Surgeon: Irene Limbo, MD;  Location: [place];  Service: Plastics;  Laterality: Bilateral;   CAPSULECTOMY Bilateral 01/14/2018   Procedure: CAPSULECTOMY;  Surgeon: Irene Limbo, MD;  Location: [place];  Service: Plastics;  Laterality: Bilateral;   CARDIAC CATHETERIZATION N/A 01/01/2015   Procedure: Left Heart Cath and Coronary Angiography;  Surgeon: Jettie Booze, MD;  Location: [place] [facility];  Service: Cardiovascular;  Laterality: N/A;   CARDIAC CATHETERIZATION N/A 05/12/2015   Procedure: Left Heart Cath and Coronary Angiography;  Surgeon: Troy Sine, MD;  Location: [place] [facility];  Service: Cardiovascular;  Laterality: N/A;   CARDIAC CATHETERIZATION N/A 05/12/2015   Procedure: Coronary Stent Intervention;  Surgeon: Troy Sine, MD;  Location: [place] [facility];  Service: Cardiovascular;  Laterality: N/A;   CARDIOVERSION N/A 02/01/2015   Procedure: CARDIOVERSION;  Surgeon: Lelon Perla, MD;  Location: [facility] ENDOSCOPY;  Service: Cardiovascular;  Laterality: N/A;   CARDIOVERSION N/A 08/30/2015   Procedure: CARDIOVERSION;   Surgeon: Sanda Klein, MD;  Location: [place] ENDOSCOPY;  Service: Cardiovascular;  Laterality: N/A;   CARPAL TUNNEL RELEASE  2008   "right hand/thumb; carpal tunnel repair; got rid of arthritis" (01/21/2012)   CLIPPING OF ATRIAL APPENDAGE N/A 01/04/2015   Procedure: CLIPPING OF ATRIAL APPENDAGE;  Surgeon: Grace Isaac, MD;  Location: [place];  Service: Open Heart Surgery;  Laterality: N/A;   COLONOSCOPY N/A 12/21/2015   Procedure: COLONOSCOPY;  Surgeon: Milus Banister, MD;  Location: [facility] ENDOSCOPY;  Service: Endoscopy;  Laterality: N/A;   CORONARY ARTERY BYPASS GRAFT N/A 01/04/2015   Procedure: CORONARY ARTERY BYPASS GRAFTING (CABG) x 3 using left internal mammory artery and greater saphenous vein right leg harvested endoscopically.;  Surgeon: Grace Isaac, MD;  LIMA-LAD, SVG-RI, SVG-PDA   CORONARY STENT INTERVENTION N/A 11/07/2020   Procedure: CORONARY STENT INTERVENTION;  Surgeon: Early Osmond, MD;  Location: [place] [facility];  Service: Cardiovascular;  Laterality: N/A;   ESOPHAGOGASTRODUODENOSCOPY (EGD) WITH PROPOFOL N/A 12/18/2015   Procedure: ESOPHAGOGASTRODUODENOSCOPY (EGD) WITH PROPOFOL;  Surgeon: Milus Banister, MD;  Location: [facility];  Service: Endoscopy;  Laterality: N/A;   FACELIFT, LOWER 2/3  1995   "mini" (01/21/2012)   GIVENS CAPSULE STUDY N/A 12/21/2015   Procedure: GIVENS CAPSULE STUDY;  Surgeon: Milus Banister, MD;  Location: [facility];  Service: Endoscopy;  Laterality: N/A;   LEFT HEART CATH AND CORS/GRAFTS ANGIOGRAPHY N/A 11/07/2020   Procedure: LEFT HEART CATH AND CORS/GRAFTS ANGIOGRAPHY;  Surgeon: Early Osmond, MD;  Location: [place] [facility];  Service: Cardiovascular;  Laterality: N/A;   Lower Arterial Examination  10/28/2011   R. SFA stent mild-moderate mixed density plaque with elevated velocities consistent with 50% diameter reduction. L. SFA stent moderate mixed denisty plaque at mid to distal level consistent with 50-69% diameter reduction.    LOWER EXTREMITY ANGIOGRAPHY N/A 02/15/2017   Procedure: LOWER EXTREMITY ANGIOGRAPHY;  Surgeon: Lorretta Harp, MD;  Location: [place] [facility];  Service: Cardiovascular;  Laterality: N/A;   OOPHORECTOMY  ~1979   PARATHYROIDECTOMY N/A 05/13/2017   Procedure: PARATHYROIDECTOMY;  Surgeon: Armandina Gemma, MD;  Location: [facility];  Service: General;  Laterality: N/A;   PARTIAL COLECTOMY  2010   PERIPHERAL ARTERIAL STENT GRAFT  2012; 2012   "LLE; RLE" (01/21/2012)   PERIPHERAL VASCULAR BALLOON ANGIOPLASTY Right 02/15/2017   Procedure: PERIPHERAL VASCULAR BALLOON ANGIOPLASTY;  Surgeon: Lorretta Harp, MD;  Location: [place] [facility];  Service: Cardiovascular;  Laterality: Right;  SFA     PERIPHERAL VASCULAR INTERVENTION Right 10/06/2018   Procedure: PERIPHERAL VASCULAR INTERVENTION;  Surgeon: Lorretta Harp, MD;  Location: [place] [facility];  Service: Cardiovascular;  Laterality: Right;   PERMANENT PACEMAKER INSERTION N/A 01/21/2012   Medtronic Adapta L implanted by Dr Sallyanne Kuster for tachy/brady syndrome   POSTERIOR CERVICAL LAMINECTOMY  1985   TEE WITHOUT CARDIOVERSION N/A 01/04/2015   Procedure: TRANSESOPHAGEAL ECHOCARDIOGRAM (TEE);  Surgeon: Grace Isaac, MD;  Location: [place];  Service: Open Heart Surgery;  Laterality: N/A;   TEE WITHOUT CARDIOVERSION N/A 02/01/2015   Procedure: TRANSESOPHAGEAL ECHOCARDIOGRAM (TEE);  Surgeon: Lelon Perla, MD;  Location: [facility] ENDOSCOPY;  Service: Cardiovascular;  Laterality: N/A;   TEE WITHOUT CARDIOVERSION N/A 08/30/2015   Procedure: TRANSESOPHAGEAL ECHOCARDIOGRAM (TEE);  Surgeon: Sanda Klein, MD;  Location: [facility] ENDOSCOPY;  Service: Cardiovascular;  Laterality: N/A;   VAGINAL HYSTERECTOMY  1975     Current Outpatient Medications  Medication Sig Dispense Refill   amLODipine (NORVASC) 5 MG tablet Take 1 tablet (5 mg total) by mouth in the morning and at bedtime. 180 tablet 2   ascorbic acid (VITAMIN C) 250 MG CHEW Chew 750 mg by mouth daily.      aspirin EC 81 MG tablet Take 81 mg by mouth daily.      Biotin 10000 MCG TABS Take 10,000 mcg by mouth daily.     carvedilol (COREG) 6.25 MG tablet TAKE 1 TABLET BY MOUTH 2 TIMES DAILY WITH A MEAL. 180 tablet 3   Cholecalciferol (VITAMIN D) 50 MCG (2000 UT) tablet Take 2,000 Units by mouth daily.     citalopram (CELEXA) 40 MG tablet Take 1 tablet (40 mg total) by mouth daily. 90 tablet 1   clopidogrel (PLAVIX) 75 MG tablet TAKE 1 TABLET (75 MG TOTAL) BY MOUTH DAILY WITH BREAKFAST. 90 tablet 3   Cyanocobalamin (B-12) 5000 MCG CAPS Take 10,000 mcg by mouth daily.     dofetilide (TIKOSYN) 125 MCG capsule TAKE 1 CAPSULE  BY MOUTH EVERY 12 HOURS. (GENERIC FOR TIKOSYN) 180 capsule 2   furosemide (LASIX) 40 MG tablet TAKE 1 TABLET BY  MOUTH EVERY OTHER DAY 20 tablet 2   isosorbide mononitrate (IMDUR) 30 MG 24 hr tablet Take 1 tablet (30 mg total) by mouth daily. 90 tablet 3   levothyroxine (SYNTHROID, LEVOTHROID) 25 MCG tablet Take 25 mcg by mouth daily before breakfast.      loteprednol (LOTEMAX) 0.5 % ophthalmic suspension Place 2 drops into both eyes See admin instructions. Instill 2 drops into both eyes 2 times a day twice weekly     Magnesium 200 MG TABS Take 200 mg by mouth daily.     Multiple Vitamin (MULTIVITAMIN WITH MINERALS) TABS tablet Take 1 tablet by mouth 2 (two) times daily.     Multiple Vitamins-Minerals (ZINC PO) Take 150 mg by mouth daily.     rosuvastatin (CRESTOR) 40 MG tablet Take 1 tablet (40 mg total) by mouth daily. 90 tablet 3   VITAMIN D-VITAMIN K PO Take 1 capsule by mouth daily.     furosemide (LASIX) 80 MG tablet Take 1 tablet (80 mg total) by mouth daily. (Patient not taking: Reported on 11/18/2020) 90 tablet 3   nitroGLYCERIN (NITROSTAT) 0.4 MG SL tablet Place 1 tablet (0.4 mg total) under the tongue every 5 (five) minutes x 3 doses as needed for chest pain. 25 tablet 2   No current facility-administered medications for this visit.    Allergies:   Brilinta [ticagrelor],  Atorvastatin, Clonidine derivatives, Crestor [rosuvastatin calcium], Exforge [amlodipine besylate-valsartan], and Spironolactone    Social History:  The patient  reports that she quit smoking about 14 years ago. Her smoking use included cigarettes. She has a 7.50 pack-year smoking history. She has never used smokeless tobacco. She reports current alcohol use. She reports that she does not use drugs.   Family History:  The patient's family history includes CVA in her brother; Heart attack in her brother; Heart disease in her brother, father, and mother; Hypertension in her brother and mother; Stroke in her father and mother.    ROS: All other systems are reviewed and negative. Unless otherwise mentioned in H&P    PHYSICAL EXAM: VS:  BP 118/62 (BP Location: Left Arm, Patient Position: Sitting, Cuff Size: Normal)   Pulse 82   Ht _0  (1.626 m)   Wt 144 lb 6.4 oz (65.5 kg)   SpO2 95%   BMI 24.79 kg/m  , BMI Body mass index is 24.79 kg/m. GEN: Well nourished, well developed, in no acute distress HEENT: normal Neck: no JVD, carotid bruits, or masses Cardiac: RRR; no murmurs, rubs, or gallops,no edema, distal pulses are diminished with doppler pulses ausculted.  Respiratory:  Clear to auscultation bilaterally, normal work of breathing GI: soft, nontender, nondistended, + BS MS: no deformity or atrophy Skin: warm and dry, no rash Neuro:  Strength and sensation are intact Psych: euthymic mood, full affect   EKG:  Atrial paced rhythm. ST abnormality possible inferolateral ischemia.  Rate of 82 bpm.   Recent Labs: 06/11/2020: ALT 13 10/21/2020: Magnesium 2.3; NT-Pro BNP 2,967 11/05/2020: BUN 34; Creatinine, Ser 1.72; Hemoglobin 15.1; Platelets 273; Potassium 4.9; Sodium 134    Lipid Panel    Component Value Date/Time   CHOL 202 (H) 06/11/2020 0952   TRIG 177 (H) 06/11/2020 0952   HDL 89 06/11/2020 0952   CHOLHDL 2.3 06/11/2020 0952   CHOLHDL 2.5 05/12/2015 0324   VLDL 23 05/12/2015  0324   LDLCALC 84 06/11/2020 0952      Wt Readings from Last 3 Encounters:  11/18/20 144 lb 6.4 oz (65.5 kg)  11/07/20 145 lb (65.8 kg)  11/01/20 145 lb 6.4 oz (66 kg)      Other studies Reviewed: Echocardiogram 2020/10/06 1. Compared with the echo 02/6071, systolic function has worsened. Left  ventricular ejection fraction, by estimation, is 35 to 40%. The left  ventricle has moderately decreased function. The left ventricle  demonstrates global hypokinesis. There is mild  concentric left ventricular hypertrophy. Left ventricular diastolic  parameters are consistent with Grade II diastolic dysfunction  (pseudonormalization). Elevated left ventricular end-diastolic pressure.   2. Right ventricular systolic function is normal. The right ventricular  size is normal. There is moderately elevated pulmonary artery systolic  pressure.   3. Left atrial size was moderately dilated.   4. The mitral valve is normal in structure. Mild mitral valve  regurgitation. No evidence of mitral stenosis.   5. Tricuspid valve regurgitation is moderate to severe.   6. The aortic valve is tricuspid. There is mild calcification of the  aortic valve. There is mild thickening of the aortic valve. Aortic valve  regurgitation is mild to moderate. No aortic stenosis is present.   7. The inferior vena cava is normal in size with greater than 50%  respiratory variability, suggesting right atrial pressure of 3 mmHg.  Cardiac Catheterization 11/07/2020:   [place] lesion is 65% stenosed.   Prox LAD lesion is 80% stenosed.   Mid LAD lesion is 100% stenosed.   Dist RCA lesion is 100% stenosed.   Origin lesion is 100% stenosed.   Origin to Prox Graft lesion is 99% stenosed.   Previously placed Prox RCA stent (unknown type) is  widely patent.   A drug-eluting stent was successfully placed using a STENT ONYX FRONTIER 3.0X18.   Post intervention, there is a 0% residual stenosis.   LIMA graft was visualized by  angiography and is normal in caliber.   SVG graft was not injected.   The graft exhibits no disease.   LV end diastolic pressure is normal.    High grade in-stent restenosis in vein graft to PDA treated with one drug-eluting stent. Moderate unchanged disease of unrevascularized ramus intermedius branch. Patent LIMA to LAD with known occlusion of vein graft to ramus intermedius. Normal LVEDP.   Recommendations:  Continue DAPT and aggressive cardiovascular risk factor modification.  Anticipate same day discharge if criteria are met.   Diagnostic Dominance: Right Intervention   _____________ ASSESSMENT AND PLAN:  1. CAD: Hx of 3 vessel bypass and stents to the RCA and RCA graft: She comes today without any new complaints with the exception of fatigue. She worked in her yard [employer] and was tired afterwards. No chest pain.  I have reviewed her cardia cath with her and provided explanation on her stent placement location and other anatomy seen on the cath report. I have given her a copy of the illustration, Her grandson is an ER physician in [place], and would like to review her cath report.   I will not make any changes in her regimen at this time. She will follow up with Dr. Debara Pickett in 6 months unless she is symptomatic.   2. Hyperlipidemia; She will continue statin therapy with goal of LDL < 70. Follow up labs in 6 months unless completed by PCP. Last LDL was documented at 6 in May 2022.  3. PAD: Hx of stent to R SFA: Has annual ABI's for survelience. Most recent was completed in 05/09/2020. Continue current management. She states she has some tingling in the left LE.  If  this becomes worse this should be reported.   4. Pacemaker in situ: Medtronic Adapta Dual chamber pacemaker.  Followed remotely per protocol and in person    Current medicines are reviewed at length with the patient today.  I have spent 30 minutes dedicated to the care of this patient on the date of this  encounter to include pre-visit review of records, assessment, management and diagnostic testing,with shared decision making.  Labs/ tests ordered today include: None  Phill Myron. West Pugh, ANP, AACC   11/18/2020 12:36 PM    [facility] [place] [address] Office [phone] Fax [phone]  Notice: This dictation was prepared with Dragon dictation along with smaller phrase technology. Any transcriptional errors that result from this process are unintentional and may not be corrected upon review.

## 2020-11-18 ENCOUNTER — Encounter: Payer: Self-pay | Admitting: Adult Health

## 2020-11-18 ENCOUNTER — Other Ambulatory Visit: Payer: Self-pay

## 2020-11-18 ENCOUNTER — Ambulatory Visit: Payer: Medicare HMO | Admitting: Adult Health

## 2020-11-18 VITALS — BP 118/62 | HR 82 | Ht 64.0 in | Wt 144.4 lb

## 2020-11-18 DIAGNOSIS — I25708 Atherosclerosis of coronary artery bypass graft(s), unspecified, with other forms of angina pectoris: Secondary | ICD-10-CM | POA: Diagnosis not present

## 2020-11-18 DIAGNOSIS — Z950 Presence of cardiac pacemaker: Secondary | ICD-10-CM

## 2020-11-18 DIAGNOSIS — Z955 Presence of coronary angioplasty implant and graft: Secondary | ICD-10-CM | POA: Diagnosis not present

## 2020-11-18 DIAGNOSIS — E7800 Pure hypercholesterolemia, unspecified: Secondary | ICD-10-CM | POA: Diagnosis not present

## 2020-11-18 DIAGNOSIS — I739 Peripheral vascular disease, unspecified: Secondary | ICD-10-CM

## 2020-11-18 NOTE — Patient Instructions (Signed)
Medication Instructions:  No Changes *If you need a refill on your cardiac medications before your next appointment, please call your pharmacy*   Lab Work: No Labs If you have labs (blood work) drawn today and your tests are completely normal, you will receive your results only by: [place] (if you have MyChart) OR A paper copy in the mail If you have any lab test that is abnormal or we need to change your treatment, we will call you to review the results.   Testing/Procedures: No Testing   Follow-Up: At [facility], you and your health needs are our priority.  As part of our continuing mission to provide you with exceptional heart care, we have created designated Provider Care Teams.  These Care Teams include your primary Cardiologist (physician) and Advanced Practice Providers (APPs -  Physician Assistants and Nurse Practitioners) who all work together to provide you with the care you need, when you need it.  Your next appointment:   6 month(s)  The format for your next appointment:   In Person  Provider:   Sanda Klein, MD

## 2020-11-20 NOTE — Progress Notes (Signed)
Remote pacemaker transmission.   

## 2020-12-17 ENCOUNTER — Other Ambulatory Visit: Payer: Self-pay | Admitting: Cardiovascular Disease

## 2020-12-26 DIAGNOSIS — I679 Cerebrovascular disease, unspecified: Secondary | ICD-10-CM | POA: Diagnosis not present

## 2020-12-26 DIAGNOSIS — I5032 Chronic diastolic (congestive) heart failure: Secondary | ICD-10-CM | POA: Diagnosis not present

## 2020-12-26 DIAGNOSIS — E785 Hyperlipidemia, unspecified: Secondary | ICD-10-CM | POA: Diagnosis not present

## 2020-12-26 DIAGNOSIS — I10 Essential (primary) hypertension: Secondary | ICD-10-CM | POA: Diagnosis not present

## 2020-12-26 DIAGNOSIS — N183 Chronic kidney disease, stage 3 unspecified: Secondary | ICD-10-CM | POA: Diagnosis not present

## 2020-12-26 DIAGNOSIS — I351 Nonrheumatic aortic (valve) insufficiency: Secondary | ICD-10-CM | POA: Diagnosis not present

## 2020-12-26 DIAGNOSIS — E875 Hyperkalemia: Secondary | ICD-10-CM | POA: Diagnosis not present

## 2020-12-26 DIAGNOSIS — E213 Hyperparathyroidism, unspecified: Secondary | ICD-10-CM | POA: Diagnosis not present

## 2020-12-26 DIAGNOSIS — Z950 Presence of cardiac pacemaker: Secondary | ICD-10-CM | POA: Diagnosis not present

## 2020-12-26 DIAGNOSIS — Z23 Encounter for immunization: Secondary | ICD-10-CM | POA: Diagnosis not present

## 2021-01-01 ENCOUNTER — Other Ambulatory Visit: Payer: Self-pay | Admitting: Nephrology

## 2021-01-01 DIAGNOSIS — N183 Chronic kidney disease, stage 3 unspecified: Secondary | ICD-10-CM

## 2021-01-07 ENCOUNTER — Encounter: Payer: Self-pay | Admitting: Cardiovascular Disease

## 2021-01-07 ENCOUNTER — Other Ambulatory Visit: Payer: Self-pay

## 2021-01-07 ENCOUNTER — Ambulatory Visit (INDEPENDENT_AMBULATORY_CARE_PROVIDER_SITE_OTHER): Payer: Medicare HMO | Admitting: Cardiovascular Disease

## 2021-01-07 VITALS — BP 142/80 | HR 91 | Ht 64.0 in | Wt 143.4 lb

## 2021-01-07 DIAGNOSIS — I5042 Chronic combined systolic (congestive) and diastolic (congestive) heart failure: Secondary | ICD-10-CM | POA: Diagnosis not present

## 2021-01-07 DIAGNOSIS — I25708 Atherosclerosis of coronary artery bypass graft(s), unspecified, with other forms of angina pectoris: Secondary | ICD-10-CM

## 2021-01-07 DIAGNOSIS — I701 Atherosclerosis of renal artery: Secondary | ICD-10-CM

## 2021-01-07 DIAGNOSIS — I361 Nonrheumatic tricuspid (valve) insufficiency: Secondary | ICD-10-CM

## 2021-01-07 DIAGNOSIS — Z950 Presence of cardiac pacemaker: Secondary | ICD-10-CM | POA: Diagnosis not present

## 2021-01-07 DIAGNOSIS — I495 Sick sinus syndrome: Secondary | ICD-10-CM

## 2021-01-07 DIAGNOSIS — E7800 Pure hypercholesterolemia, unspecified: Secondary | ICD-10-CM | POA: Diagnosis not present

## 2021-01-07 DIAGNOSIS — I739 Peripheral vascular disease, unspecified: Secondary | ICD-10-CM

## 2021-01-07 DIAGNOSIS — I10 Essential (primary) hypertension: Secondary | ICD-10-CM

## 2021-01-07 DIAGNOSIS — Z5181 Encounter for therapeutic drug level monitoring: Secondary | ICD-10-CM

## 2021-01-07 DIAGNOSIS — I480 Paroxysmal atrial fibrillation: Secondary | ICD-10-CM | POA: Diagnosis not present

## 2021-01-07 DIAGNOSIS — Z79899 Other long term (current) drug therapy: Secondary | ICD-10-CM

## 2021-01-07 DIAGNOSIS — E213 Hyperparathyroidism, unspecified: Secondary | ICD-10-CM

## 2021-01-07 NOTE — Progress Notes (Signed)
Patient ID: [name], female   DOB: Jul 02, 1940, 80 y.o.   MRN: [id number]     Cardiology Office Note    Date:  01/07/2021   ID:  [name], [name] 1940-11-26, MRN [id number]  PCP:  [name] Pollen, MD  Cardiologist:   [name] Klein, MD   Chief Complaint  Patient presents with   Coronary Artery Disease     History of Present Illness:  [name] is a 80 y.o. female with coronary artery disease s/p bypass surgery (November 2016) and placement of drug-eluting stents (DES to distal SVG-RCA, April 2017 ) and left atrial appendage clipping, symptomatic paroxysmal atrial fibrillation and atrial flutter with radiofrequency ablation (cavotricuspid isthmus and pulmonary vein isolation), PAD with previous lower extremity stents, extensive intracranial atherosclerotic disease, history of GI bleeding while on anticoagulation, volatile hypertension, CKD stage III with occlusion of the right renal artery.  She has had substantial improvement in wellbeing following her last coronary revascularization procedure on 11/13/2020 she received a new drug-eluting stent to the SVG-RCA.Marland Kitchen  She has easy bruising on dual antiplatelet therapy but has not had any serious bleeding.  She has not had palpitations or syncope.  She denies chest pain at rest or with activity.  She has not had claudication recently.  She denies orthopnea, PND, leg edema.  She has not required adjustments in loop diuretic dosage.  She inquires about the efficacy and safety of chelation therapy.  I recommended against it.  She told me that her companion [name], who was also my patient, recently passed away from a hematological malignancy.  She recovered well from COVID-19 infection in August 2021.  She was hospitalized with abdominal pain at [facility] on April 22, 2020.  Her work-up included an abdominal CT that showed a relatively smaller size right kidney and suspicion for high-grade  right renal artery stenosis.  Her creatinine was 1.1 at that time.  Incidental note was also made of a 1.6 cm right adrenal mass and prominent atherosclerosis in the infrarenal abdominal aorta. There was evidence of colonic distention and constipation which was felt to be the cause of her symptoms.  She has a dual-chamber MRI conditional Medtronic Adapta pacemaker that shows 100% atrial paced, ventricular sensed rhythm, underlying rhythm being sinus bradycardia in the 40s.  She has had an excellent response to dofetilide without recent atrial fibrillation.  She has a history of left atrial appendage clipping so was not on anticoagulants.   She has a history of hyperkalemia with ACE inhibitors in the past.  Amiodarone caused hypothyroidism and alopecia.   She has tried taking statins but has repeatedly stopped them over the years due to musculoskeletal problems, but currently seems to be tolerating rosuvastatin 40 mg daily.  In late August 2020 she underwent an abdominal aortic angiogram with runoff, followed by angioplasty and drug coated stenting of a chronic total occlusion of the right superficial femoral artery. Preprocedure ABI was 0.82  Post procedure ABI was 1.1 at follow-up on October 18 2018, 1.0 in March 2021 (bilaterally).  By ultrasound performed in July 2021, known occluded mid right SFA stent with reconstitution above the level of the popliteal artery and three-vessel runoff, 80% stenosis in the proximal left SFA with widely patent mid left SFA stent.  She has normal left ventricular systolic function. She does have evidence of grade 2 diastolic dysfunction on previous echo. At the time of her bypass surgery she received an Atricure left atrial  appendage clip. She is also on clopidogrel following her drug-eluting stents in April 2017. The pacemaker was implanted in 2013 for symptomatic sinus bradycardia. She also has a history of peripheral arterial disease and received bilateral superficial  femoral artery stents in 2016, repeat revascularization with drug-coated stent to a totally occluded right superficial femoral artery in 2020. She has never smoked but drinks daily. She has treated hypertension.  After undergoing bypass surgery in November 2016 she returned with non-ST segment elevation myocardial infarction in April 2017 and required placement of stents both in the native right coronary artery (3.020 mm Synergy DES) and in the saphenous vein graft to the distal right coronary artery (2.7538 mm Synergy DES) due to early graft dysfunction. The saphenous vein graft to the ramus intermedius was also occluded, but this vessel was left for medical therapy.   Echocardiogram in August 2022 showed abrupt reduction in LV function to an EF of 35-40% due to a new inferior wall motion abnormality.  Thallium viability study in September 2022 showed no evidence of myocardial infarction/scar, viability in all territories.  She underwent PCI to the SVG-RCA after cardiac catheterization on 11/13/2020 with symptomatic improvement.  Follow-up echo pending.  Past Medical History:  Diagnosis Date   ANXIETY 12/31/2009   Aortic insufficiency    a. mod by echo 2017.   CAD (coronary artery disease)    a. s/p CABGx3 and LAA clipping in 12/2014, NSTEMI 05/2015 s/p DES to native RCA and SVG-dRCA; occluded ramus-SVG was treated medically). c. neg nuc 10/2015 at [facility].   Chronic diastolic CHF (congestive heart failure) (HCC)    Chronic fatigue    CKD (chronic kidney disease), stage II    DEPRESSION 12/31/2009   [place] DISEASE, CERVICAL 12/31/2009   [place] DISEASE, LUMBAR 12/31/2009   DIVERTICULITIS, HX OF 12/31/2009   Essential hypertension    Gait abnormality 03/25/2017   GLUCOSE INTOLERANCE 12/31/2009   Habitual alcohol use    History of stroke    self reports " they say i have had some pin strokes"    Hypercalcemia    Hyperkalemia    Hyperlipidemia 12/31/2009   Hypothyroidism    MENOPAUSE, EARLY  12/31/2009   NSTEMI (non-ST elevated myocardial infarction) ([place]) 05/11/2015   Orthostatic hypotension    OSTEOARTHRITIS, HAND    Pacemaker 01/21/2012   MDT Adapta dual chamber   Paroxysmal atrial flutter ([place])    Persistent atrial fibrillation (HCC)    PVD (peripheral vascular disease), hx stents to bil SFAs 02/2010    Rash, skin     superficial raised red pencil point sized rash bilateral forearms; states " it started when i started the Plavix "    Ruptured left breast implant    Symptomatic sinus bradycardia 01/22/2012   Syncope    a. 11/2015 ? due to medications.   Syncope    reports on 05-10-17 " i passed out 2 months ago in the bathroom and broke some ribs"     Tachy-brady syndrome ([place])    a. s/p MDT PPM 2013.   Tricuspid regurgitation     Past Surgical History:  Procedure Laterality Date   ABDOMINAL AORTOGRAM W/LOWER EXTREMITY Bilateral 02/15/2017   Procedure: ABDOMINAL AORTOGRAM W/LOWER EXTREMITY;  Surgeon: Lorretta Harp, MD;  Location: [place] [facility];  Service: Cardiovascular;  Laterality: Bilateral;  bilat   ABDOMINAL AORTOGRAM W/LOWER EXTREMITY N/A 10/06/2018   Procedure: ABDOMINAL AORTOGRAM W/LOWER EXTREMITY;  Surgeon: Lorretta Harp, MD;  Location: [place] [facility];  Service:  Cardiovascular;  Laterality: N/A;   Ablation of typical atrial flutter (CTI line)  09/05/2016   Dr Antonieta Pert at [place]   Removed 01/2018   BREAST IMPLANT REMOVAL Bilateral 01/14/2018   Procedure: REMOVAL BILATERAL BREAST IMPLANTS;  Surgeon: Irene Limbo, MD;  Location: [place];  Service: Plastics;  Laterality: Bilateral;   CAPSULECTOMY Bilateral 01/14/2018   Procedure: CAPSULECTOMY;  Surgeon: Irene Limbo, MD;  Location: [place];  Service: Plastics;  Laterality: Bilateral;   CARDIAC CATHETERIZATION N/A 01/01/2015   Procedure: Left Heart Cath and Coronary Angiography;  Surgeon: Jettie Booze, MD;  Location:  [place] [facility];  Service: Cardiovascular;  Laterality: N/A;   CARDIAC CATHETERIZATION N/A 05/12/2015   Procedure: Left Heart Cath and Coronary Angiography;  Surgeon: Troy Sine, MD;  Location: [place] [facility];  Service: Cardiovascular;  Laterality: N/A;   CARDIAC CATHETERIZATION N/A 05/12/2015   Procedure: Coronary Stent Intervention;  Surgeon: Troy Sine, MD;  Location: [place] [facility];  Service: Cardiovascular;  Laterality: N/A;   CARDIOVERSION N/A 02/01/2015   Procedure: CARDIOVERSION;  Surgeon: Lelon Perla, MD;  Location: [facility] ENDOSCOPY;  Service: Cardiovascular;  Laterality: N/A;   CARDIOVERSION N/A 08/30/2015   Procedure: CARDIOVERSION;  Surgeon: [name] Klein, MD;  Location: [place] ENDOSCOPY;  Service: Cardiovascular;  Laterality: N/A;   CARPAL TUNNEL RELEASE  2008   "right hand/thumb; carpal tunnel repair; got rid of arthritis" (01/21/2012)   CLIPPING OF ATRIAL APPENDAGE N/A 01/04/2015   Procedure: CLIPPING OF ATRIAL APPENDAGE;  Surgeon: Grace Isaac, MD;  Location: [place];  Service: Open Heart Surgery;  Laterality: N/A;   COLONOSCOPY N/A 12/21/2015   Procedure: COLONOSCOPY;  Surgeon: Milus Banister, MD;  Location: [facility] ENDOSCOPY;  Service: Endoscopy;  Laterality: N/A;   CORONARY ARTERY BYPASS GRAFT N/A 01/04/2015   Procedure: CORONARY ARTERY BYPASS GRAFTING (CABG) x 3 using left internal mammory artery and greater saphenous vein right leg harvested endoscopically.;  Surgeon: Grace Isaac, MD;  LIMA-LAD, SVG-RI, SVG-PDA   CORONARY STENT INTERVENTION N/A 11/07/2020   Procedure: CORONARY STENT INTERVENTION;  Surgeon: Early Osmond, MD;  Location: [place] [facility];  Service: Cardiovascular;  Laterality: N/A;   ESOPHAGOGASTRODUODENOSCOPY (EGD) WITH PROPOFOL N/A 12/18/2015   Procedure: ESOPHAGOGASTRODUODENOSCOPY (EGD) WITH PROPOFOL;  Surgeon: Milus Banister, MD;  Location: [facility];  Service: Endoscopy;  Laterality: N/A;   FACELIFT, LOWER 2/3  1995   "mini"  (01/21/2012)   GIVENS CAPSULE STUDY N/A 12/21/2015   Procedure: GIVENS CAPSULE STUDY;  Surgeon: Milus Banister, MD;  Location: [facility];  Service: Endoscopy;  Laterality: N/A;   LEFT HEART CATH AND CORS/GRAFTS ANGIOGRAPHY N/A 11/07/2020   Procedure: LEFT HEART CATH AND CORS/GRAFTS ANGIOGRAPHY;  Surgeon: Early Osmond, MD;  Location: [place] [facility];  Service: Cardiovascular;  Laterality: N/A;   Lower Arterial Examination  10/28/2011   R. SFA stent mild-moderate mixed density plaque with elevated velocities consistent with 50% diameter reduction. L. SFA stent moderate mixed denisty plaque at mid to distal level consistent with 50-69% diameter reduction.   LOWER EXTREMITY ANGIOGRAPHY N/A 02/15/2017   Procedure: LOWER EXTREMITY ANGIOGRAPHY;  Surgeon: Lorretta Harp, MD;  Location: [place] [facility];  Service: Cardiovascular;  Laterality: N/A;   OOPHORECTOMY  ~1979   PARATHYROIDECTOMY N/A 05/13/2017   Procedure: PARATHYROIDECTOMY;  Surgeon: Armandina Gemma, MD;  Location: [facility];  Service: General;  Laterality: N/A;   PARTIAL COLECTOMY  2010   PERIPHERAL ARTERIAL STENT GRAFT  2012; 2012   "LLE; RLE" (01/21/2012)   PERIPHERAL VASCULAR BALLOON ANGIOPLASTY Right 02/15/2017   Procedure: PERIPHERAL VASCULAR BALLOON ANGIOPLASTY;  Surgeon: Lorretta Harp, MD;  Location: [place] [facility];  Service: Cardiovascular;  Laterality: Right;  SFA     PERIPHERAL VASCULAR INTERVENTION Right 10/06/2018   Procedure: PERIPHERAL VASCULAR INTERVENTION;  Surgeon: Lorretta Harp, MD;  Location: [place] [facility];  Service: Cardiovascular;  Laterality: Right;   PERMANENT PACEMAKER INSERTION N/A 01/21/2012   Medtronic Adapta L implanted by Dr Sallyanne Kuster for tachy/brady syndrome   POSTERIOR CERVICAL LAMINECTOMY  1985   TEE WITHOUT CARDIOVERSION N/A 01/04/2015   Procedure: TRANSESOPHAGEAL ECHOCARDIOGRAM (TEE);  Surgeon: Grace Isaac, MD;  Location: [place];  Service: Open Heart Surgery;  Laterality: N/A;   TEE  WITHOUT CARDIOVERSION N/A 02/01/2015   Procedure: TRANSESOPHAGEAL ECHOCARDIOGRAM (TEE);  Surgeon: Lelon Perla, MD;  Location[facility];  Service: Cardiovascular;  Laterality: N/A;   TEE WITHOUT CARDIOVERSION N/A 08/30/2015   Procedure: TRANSESOPHAGEAL ECHOCARDIOGRAM (TEE);  Surgeon: [name] Klein, MD;  Location: [facility] ENDOSCOPY;  Service: Cardiovascular;  Laterality: N/A;   VAGINAL HYSTERECTOMY  1975    Current Medications: Outpatient Medications Prior to Visit  Medication Sig Dispense Refill   amLODipine (NORVASC) 5 MG tablet Take 1 tablet (5 mg total) by mouth in the morning and at bedtime. 180 tablet 2   ascorbic acid (VITAMIN C) 250 MG CHEW Chew 750 mg by mouth daily.     aspirin EC 81 MG tablet Take 81 mg by mouth daily.      Biotin 10000 MCG TABS Take 10,000 mcg by mouth daily.     carvedilol (COREG) 6.25 MG tablet TAKE 1 TABLET BY MOUTH 2 TIMES DAILY WITH A MEAL. 180 tablet 3   Cholecalciferol (VITAMIN D) 50 MCG (2000 UT) tablet Take 2,000 Units by mouth daily.     citalopram (CELEXA) 40 MG tablet Take 1 tablet (40 mg total) by mouth daily. 90 tablet 1   clopidogrel (PLAVIX) 75 MG tablet TAKE 1 TABLET (75 MG TOTAL) BY MOUTH DAILY WITH BREAKFAST. 90 tablet 3   Cyanocobalamin (B-12) 5000 MCG CAPS Take 10,000 mcg by mouth daily.     dofetilide (TIKOSYN) 125 MCG capsule TAKE 1 CAPSULE BY MOUTH EVERY 12 HOURS. (GENERIC FOR TIKOSYN) 180 capsule 2   furosemide (LASIX) 40 MG tablet TAKE 1 TABLET BY MOUTH EVERY OTHER DAY 20 tablet 2   furosemide (LASIX) 80 MG tablet Take 1 tablet (80 mg total) by mouth daily. 90 tablet 3   levothyroxine (SYNTHROID, LEVOTHROID) 25 MCG tablet Take 25 mcg by mouth daily before breakfast.      loteprednol (LOTEMAX) 0.5 % ophthalmic suspension Place 2 drops into both eyes See admin instructions. Instill 2 drops into both eyes 2 times a day twice weekly     Magnesium 200 MG TABS Take 200 mg by mouth daily.     Multiple Vitamin (MULTIVITAMIN WITH MINERALS) TABS  tablet Take 1 tablet by mouth 2 (two) times daily.     Multiple Vitamins-Minerals (ZINC PO) Take 150 mg by mouth daily.     nitroGLYCERIN (NITROSTAT) 0.4 MG SL tablet Place 1 tablet (0.4 mg total) under the tongue every 5 (five) minutes x 3 doses as needed for chest pain. 25 tablet 2   rosuvastatin (CRESTOR) 40 MG tablet Take 1 tablet (40 mg total) by mouth daily. 90 tablet 3   VITAMIN D-VITAMIN K PO Take 1  capsule by mouth daily.     isosorbide mononitrate (IMDUR) 30 MG 24 hr tablet Take 1 tablet (30 mg total) by mouth daily. 90 tablet 3   No facility-administered medications prior to visit.     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  Retired.   Social Determinants of Health   Financial Resource Strain: Not on file  Food Insecurity: Not on file  Transportation Needs: Not on file  Physical Activity: Not on file  Stress: Not on file  Social Connections: Not on file     Family History:  The patient's family history includes CVA in her brother; Heart attack in her brother; Heart disease in  her brother, father, and mother; Hypertension in her brother and mother; Stroke in her father and mother.   ROS:   Please see the history of present illness.   All other systems are reviewed and are negative.   PHYSICAL EXAM:   VS:  BP (!) 142/80   Pulse 91   Ht 5' 4" (1.626 m)   Wt 143 lb 6.4 oz (65 kg)   SpO2 97%   BMI 24.61 kg/m     Wt Readings from Last 3 Encounters:  01/07/21 143 lb 6.4 oz (65 kg)  11/18/20 144 lb 6.4 oz (65.5 kg)  11/07/20 145 lb (65.8 kg)     General: Alert, oriented x3, no distress, appears well.  Healthy left subclavian pacemaker site. Head: no evidence of trauma, PERRL, EOMI, no exophtalmos or lid lag, no myxedema, no xanthelasma; normal ears, nose and oropharynx Neck: normal jugular venous pulsations and no hepatojugular reflux; brisk carotid pulses without delay and no carotid bruits Chest: clear to auscultation, no signs of consolidation by percussion or palpation, normal fremitus, symmetrical and full respiratory excursions Cardiovascular: normal position and quality of the apical impulse, regular rhythm, normal first and second heart sounds, no murmurs, rubs or gallops Abdomen: no tenderness or distention, no masses by palpation, no abnormal pulsatility or arterial bruits, normal bowel sounds, no hepatosplenomegaly Extremities: no clubbing, cyanosis or edema; 2+ radial, ulnar and brachial pulses bilaterally; 2+ right femoral, posterior tibial and dorsalis pedis pulses; 2+ left femoral, posterior tibial and dorsalis pedis pulses; no subclavian or femoral bruits Neurological: grossly nonfocal Psych: Normal mood and affect    Studies/Labs Reviewed:  Thallium rest redistribution viability study 10/28/2020 :   Prior study available for comparison. Performed at [facility] 12/2015.  Negative per patient.  No results  available for review   The rest and delayed thallium study shows viable myocardium in all segments of the LV   There is no evidence of previous  myocardial infarction .   ECHO 09/24/2020:   1. Compared with the echo 04/9765, systolic function has worsened. Left ventricular ejection fraction, by estimation, is 35 to 40%. The left ventricle has moderately decreased function. The left ventricle demonstrates global hypokinesis. There is mild concentric left ventricular hypertrophy. Left ventricular diastolic parameters are consistent with Grade II diastolic dysfunction (pseudonormalization). Elevated left ventricular end-diastolic pressure.   2. Right ventricular systolic function is normal. The right ventricular size is normal. There is moderately elevated pulmonary artery systolic pressure.   3. Left atrial size was moderately dilated.   4. The mitral valve is normal in structure. Mild mitral valve regurgitation. No evidence of mitral stenosis.   5. Tricuspid valve regurgitation is moderate to severe.   6. The aortic valve is tricuspid. There is mild calcification of the  aortic valve. There is mild thickening of the aortic valve. Aortic valve  regurgitation is mild to moderate. No aortic stenosis is present.   7. The inferior vena cava is normal in size with greater than 50%  respiratory variability, suggesting right atrial pressure of 3 mmHg.   Cardiac catheterization 05/12/2015 Mid LAD lesion, 85% stenosed. Ramus lesion, 80% stenosed. Prox Cx to Mid Cx lesion, 40% stenosed. SVG was injected is normal in caliber. There is severe disease in the graft. Ostial occlusion of SVG to the ramus intermediate vessel. Origin to Prox Graft lesion, 100% stenosed. LIMA was injected is normal in caliber, and is anatomically normal. There is competitive flow. Widely patent LIMA graft which supplies the mid LAD. SVG was injected is large. There is severe disease in the graft. Severely diseased proximal to mid portion of the vein graft which supplied the PDA vessel. Of note, the distal RCA. Antegrade to the PDA was totally occluded and did not  supply the proximal to mid RCA due to this occlusion. Prox Graft lesion, 70% stenosed. Dist RCA-1 lesion, 100% stenosed. Dist RCA-2 lesion, 100% stenosed. Origin lesion, 95% stenosed. Post intervention, there is a 0% residual stenosis. Prox RCA lesion, 95% stenosed. Post intervention, there is a 0% residual stenosis. There is moderate left ventricular systolic dysfunction.   Significant native multivessel CAD with diffuse 85% proximal LAD stenosis with evidence for competitive filling via the LIMA graft supplying the mid LAD; 75-80% diffuse proximal ramus intermediate stenosis with evidence for an occluded  vein graft anastomosed in its midst portion of the vessel; 40% left circumflex stenosis, and 95% proximal RCA stenosis which supplies 2 moderate size marginal branches prior to being totally occluded in the region of the acute margin/distal RCA.   Severely diseased SVG which supplies a large distal RCA system giving rise to several branches and anastomosing into the PDA.  The native distal RCA is occluded a short distance prior to the PDA takeoff and the graft does not supply the proximal - mid native vessel due to this occlusion.     Occluded SVG at its origin to the ramus intermediate vessel.   Patent LIMA graft supplying the mid LAD.   Successful percutaneous coronary intervention of the proximal to mid vein graft supplying the large distal RCA with ultimate insertion of a 2.7538 mm Synergy DES stent postdilated to 2.91 mm with the 95 and diffuse 70% stenosis being reduced to 0%.   Successful percutaneous coronary intervention to the proximal native  RCA which supplies 2 moderate size marginal branches prior to the acute margin/ distal RCA occlusion with PTCA/ 3.020 mm Synergy DES stent postdilated to 3.25 mm with the 95% stenosis being reduced to 0%.   RECOMMENDATION: Ms. Colter has a history of rash secondary to Plavix.  She will be treated with aspirin/Brilinta forward DAPT therapy.   She had experienced continued anginal type symptoms, although mild, despite her bypass surgery which may have been due to a proximal RCA.  Subsequently she has developed unstable angina leading to her non-STEMI.  Following successful intervention, the SVG to her large distal RCA is now wide open as is her proximal native RCA which supplies two marginal branches which were not supplied by the SVG graft.  If the patient continues to experience increasing symptoms of chest pain consider PCI to the ramus intermediate vessel since the graft that supplied this is occluded.  Diagnostic Dominance: Right Intervention  Implants     Permanent Stent  Stent Synergy Des 2.75x38 - [id number] - Implanted  Stent Synergy Des 3x20 - [id number] - Implanted    Cardiac Cath 11/07/2020    Ramus lesion is 65% stenosed.   Prox LAD lesion is 80% stenosed.   Mid LAD lesion is 100% stenosed.   Dist RCA lesion is 100% stenosed.   Origin lesion is 100% stenosed.   Origin to Prox Graft lesion is 99% stenosed.   Previously placed Prox RCA stent (unknown type) is  widely patent.   A drug-eluting stent was successfully placed using a STENT ONYX FRONTIER 3.0X18.   Post intervention, there is a 0% residual stenosis.   LIMA graft was visualized by angiography and is normal in caliber.   SVG graft was not injected.   The graft exhibits no disease.   LV end diastolic pressure is normal.    High grade in-stent restenosis in vein graft to PDA treated with one drug-eluting stent. Moderate unchanged disease of unrevascularized ramus intermedius branch. Patent LIMA to LAD with known occlusion of vein graft to ramus intermedius. Normal LVEDP.   Recommendations:  Continue DAPT and aggressive cardiovascular risk factor modification.  Anticipate same day discharge if criteria are met.     Coronary Diagrams  Diagnostic Dominance: Right Intervention  Implants     Permanent Stent  Stent Onyx Frontier 3.0x18 - [id number] -  Implanted Inventory item: STENT ONYX FRONTIER 3.0X18      EKG:  EKG is not ordered today.  Recent ECG shows atrial paced, ventricular sensed rhythm with QS V1 V2, most recent QTC 474 ms  Recent Labs: 06/11/2020: ALT 13 10/21/2020: Magnesium 2.3; NT-Pro BNP 2,967 11/05/2020: BUN 34; Creatinine, Ser 1.72; Hemoglobin 15.1; Platelets 273; Potassium 4.9; Sodium 134   Lipid Panel    Component Value Date/Time   CHOL 202 (H) 06/11/2020 0952   TRIG 177 (H) 06/11/2020 0952   HDL 89 06/11/2020 0952   CHOLHDL 2.3 06/11/2020 0952   CHOLHDL 2.5 05/12/2015 0324   VLDL 23 05/12/2015 0324   LDLCALC 84 06/11/2020 0952    ASSESSMENT:    1. Chronic combined systolic and diastolic CHF (congestive heart failure) ([place])   2. Coronary artery disease of bypass graft of native heart with stable angina pectoris ([place])   3. PAF (paroxysmal atrial fibrillation) ([place])   4. Encounter for monitoring dofetilide therapy   5. SSS (sick sinus syndrome) ([facility])   6. Cardiac pacemaker in situ   7. PAD (peripheral artery disease) ([place])   8. Renal artery stenosis ([place])  9. Essential hypertension   10. Hypercholesterolemia   11. Nonrheumatic tricuspid valve regurgitation   12. Hyperparathyroidism ([employer])        PLAN:  In order of problems listed above:  CAD: Substantial symptomatic improvement following cardiac catheterization and repeat PCI of the SVG to RCA.  We will try to discontinue the isosorbide, but continue the carvedilol and amlodipine.  Reiterated the critical importance of dual antiplatelet therapy which I would be inclined to continue lifelong due to the extent and complexity of her coronary and peripheral arterial revascularizations. AFib: Excellent response to ablation and dofetilide therapy without any recent episodes of atrial fibrillation.  She has had clipping over left atrial appendage and is not on anticoagulation.  On dual antiplatelet therapy, most recent revascularization procedure October  2022. Dofetilide: Dose adjusted for renal function changes.  Most recent QTC 474 ms.  Most recent creatinine around 1.79.  Although she may have had better results at her visit with Dr. Justin Mend recently.  Potassium 4.9.  I would worry about the potential of proarrhythmic effects with chelation therapy.  I am inclined to recommend against this. SSS: Lower atrial rate limit is set relatively high since this helps to prevent recurrence of atrial fibrillation.  Heart rate histogram appears appropriate.  She is not pacemaker dependent, has underlying sinus bradycardia at about 45 bpm. PPM: Normal pacemaker function.  Continue remote downloads every 3 months. CHF:  had problems with diastolic heart failure even before the drop in LV function.  EF had decreased to 35-40% but there was evidence of viability in the inferior wall and I hope that we will see improvement in EF.  Will order limited echo.  Currently NYHA functional class I and appears clinically euvolemic on a stable dose of loop diuretic. PAD: Currently does not have claudication.  Improved symptoms following her last revascularization procedure in August 2021.  Despite restenosis on one side, she has ABI 1.0 bilaterally and does not have intermittent claudication.  On dual antiplatelet therapy.  Also noted that she has lower blood pressure in the left arm suggesting left subclavian stenosis.  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  Most recent creatinine that I have was a little worse again at 1.72. ASCVD: No stenoses in the extracranial carotid arteries.  She has mostly small vessel disease and the plan is for her to remain on clopidogrel indefinitely.   Avoid hypotension.  Best to allow systolic blood pressure around 140 to avoid ischemic cerebral injury. HTN: Blood pressure is is in desirable range today.  Note that she has lower blood pressure in the left arm and should always check blood pressure on the right. Avoid ACE inhibitors due to history of hyperkalemia while taking lisinopril. Hypercholesterolemia: LDL was 84 on rosuvastatin 10 mg daily and we have increased the dose to 40 mg daily.  She is due for repeat labs but is not fasting today.  If she does not tolerate this would switch to /add Repatha.  She does have an excellent HDL at 89, but has also had progression of disease with recent occlusion of the right renal artery and stenosis of the SVG-RCA TR: moderate to severe on TEE 2017 before SVG-RCA stent, mild in  2018, moderate 2021, now moderate-to-severe. Partly pacemaker lead related, but quite possibly it is worse due to RV ischemia.  Murmur seems less loud today.  Reevaluate on follow-up echo. 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  Normal calcium levels by most recent labs.     Medication Adjustments/Labs and Tests Ordered: Current medicines are reviewed at length with the patient today.  Concerns regarding medicines are outlined above.  Medication changes, Labs and Tests ordered today are listed in the Patient Instructions below. Patient Instructions  Medication Instructions:  STOP the Isosorbide (Imdur)  *If you need a refill on your cardiac medications before your next appointment, please call your pharmacy*   Lab Work: None ordered If you have labs (blood work) drawn today and your tests are completely normal, you will receive your results only by: [place] (if you have MyChart) OR A paper copy in the mail If you have any lab test that is abnormal or we need to change your treatment, we will call you to review the results.   Testing/Procedures: Your physician has requested  that you have an limited echocardiogram. Echocardiography is a painless test that uses sound waves to create images of your heart. It provides your doctor with information about the size and shape of your heart and how well your heart's chambers and valves are working. You may receive an ultrasound enhancing agent through an IV if needed to better visualize your heart during the echo.This procedure takes approximately one hour. There are no restrictions for this procedure. This will take place at the [address].     Follow-Up: At [facility], you and your health needs are our priority.  As part of our continuing mission to provide you with exceptional heart care, we have created designated Provider Care Teams.  These Care Teams include your primary Cardiologist (physician) and Advanced Practice Providers (APPs -  Physician Assistants and Nurse Practitioners) who all work together to provide you with the care you need, when you need it.  We recommend signing up for the patient portal called "MyChart".  Sign up information is provided on this After Visit Summary.  MyChart is used to connect with patients for Virtual Visits (Telemedicine).  Patients are able to view lab/test results, encounter notes, upcoming appointments, etc.  Non-urgent messages can be sent to your provider as well.   To learn more about what you can do with MyChart, go to [email or web address].    Your next appointment:   Follow up in April with Dr. Sallyanne Kuster on a device day   Signed, [name] Klein, MD  01/07/2021 1:53 PM    [facility] [address], [place], [place]  [address] Phone: [phone]; Fax: [phone]

## 2021-01-07 NOTE — Patient Instructions (Signed)
Medication Instructions:  STOP the Isosorbide (Imdur)  *If you need a refill on your cardiac medications before your next appointment, please call your pharmacy*   Lab Work: None ordered If you have labs (blood work) drawn today and your tests are completely normal, you will receive your results only by: [place] (if you have MyChart) OR A paper copy in the mail If you have any lab test that is abnormal or we need to change your treatment, we will call you to review the results.   Testing/Procedures: Your physician has requested that you have an limited echocardiogram. Echocardiography is a painless test that uses sound waves to create images of your heart. It provides your doctor with information about the size and shape of your heart and how well your heart's chambers and valves are working. You may receive an ultrasound enhancing agent through an IV if needed to better visualize your heart during the echo.This procedure takes approximately one hour. There are no restrictions for this procedure. This will take place at the [address].     Follow-Up: At [facility], you and your health needs are our priority.  As part of our continuing mission to provide you with exceptional heart care, we have created designated Provider Care Teams.  These Care Teams include your primary Cardiologist (physician) and Advanced Practice Providers (APPs -  Physician Assistants and Nurse Practitioners) who all work together to provide you with the care you need, when you need it.  We recommend signing up for the patient portal called "MyChart".  Sign up information is provided on this After Visit Summary.  MyChart is used to connect with patients for Virtual Visits (Telemedicine).  Patients are able to view lab/test results, encounter notes, upcoming appointments, etc.  Non-urgent messages can be sent to your provider as well.   To learn more about what you can do with MyChart, go to  [email or web address].    Your next appointment:   Follow up in April with Dr. Sallyanne Kuster on a device day

## 2021-01-10 ENCOUNTER — Telehealth: Payer: Self-pay | Admitting: Hematology and Oncology

## 2021-01-10 NOTE — Telephone Encounter (Signed)
Scheduled appt per 12/2 referral. Pt is aware of appt date and time.

## 2021-01-21 ENCOUNTER — Ambulatory Visit
Admission: RE | Admit: 2021-01-21 | Discharge: 2021-01-21 | Disposition: A | Discharge status: Home / Self Care | Payer: Medicare HMO | Source: Ambulatory Visit | Attending: Nephrology | Admitting: Nephrology

## 2021-01-21 DIAGNOSIS — N261 Atrophy of kidney (terminal): Secondary | ICD-10-CM | POA: Diagnosis not present

## 2021-01-21 DIAGNOSIS — N189 Chronic kidney disease, unspecified: Secondary | ICD-10-CM | POA: Diagnosis not present

## 2021-01-21 DIAGNOSIS — N183 Chronic kidney disease, stage 3 unspecified: Secondary | ICD-10-CM

## 2021-01-22 NOTE — Progress Notes (Deleted)
[facility] NOTE  Patient Care Team: Horald Pollen, MD as PCP - General (Internal Medicine) Croitoru, Dani Gobble, MD as PCP - Cardiology (Cardiology) Grace Isaac, MD (Inactive) as Consulting Physician (Cardiothoracic Surgery)  CHIEF COMPLAINTS/PURPOSE OF CONSULTATION:  ***  ASSESSMENT & PLAN:  No problem-specific Assessment & Plan notes found for this encounter.  No orders of the defined types were placed in this encounter.  I discussed with the patient extensively the differential diagnosis of polycythemia 1. Primary polycythemia due to clonal stem cell abnormality 2. Secondary polycythemia due to cause that include hypoxia, heart or lung problems, altitude, athletics, erythropoietin producing lesion/tumors etc  Recommendation:  1. JAK-2 mutation testing to evaluate polycythemia vera 2. Erythropoietin level 3. Drink 8 glasses of water per day  Indications for phlebotomy  1. Primary polycythemia with hematocrit over 45 2. Secondary polycythemia with severe symptoms which include strokelike symptoms, severe recurrent headaches, severe fatigue or if HCT is greater than 54.  If the JAK-2 mutation is normal and erythropoietin level is normal, a bone marrow biopsy may be considered. If the erythropoietin is elevated, he will need ultrasound of the liver and kidney for further evaluation.   I provided patient written information on polycythemia.     HISTORY OF PRESENTING ILLNESS:  [name] 80 y.o. female is here because of polycythemia.  80 year old female patient with coronary artery disease status post bypass surgery November 2016 placement of drug-eluting stent, paroxysmal atrial fibrillation and atrial flutter who was recently seen by her nephrologist for elevated creatinine referred to hematology for polycythemia.  Her most recent hemoglobin was 16 g/dL collected at the nephrologist office, no evidence of monoclonal protein, most recent  creatinine was 1.36 megs per deciliter.  Pertinent past surgical history includes significant cardiac history, otherwise no definitive evidence of obstructive sleep apnea.  She consumes alcoholic beverage daily, quit smoking in 2002.  She is retired no medications that could contribute to polycythemia on brief review.  REVIEW OF SYSTEMS:   Constitutional: Denies fevers, chills or abnormal night sweats Eyes: Denies blurriness of vision, double vision or watery eyes Ears, nose, mouth, throat, and face: Denies mucositis or sore throat Respiratory: Denies cough, dyspnea or wheezes Cardiovascular: Denies palpitation, chest discomfort or lower extremity swelling Gastrointestinal:  Denies nausea, heartburn or change in bowel habits Skin: Denies abnormal skin rashes Lymphatics: Denies new lymphadenopathy or easy bruising Neurological:Denies numbness, tingling or new weaknesses Behavioral/Psych: Mood is stable, no new changes  All other systems were reviewed with the patient and are negative.  MEDICAL HISTORY:  Past Medical History:  Diagnosis Date   ANXIETY 12/31/2009   Aortic insufficiency    a. mod by echo 2017.   CAD (coronary artery disease)    a. s/p CABGx3 and LAA clipping in 12/2014, NSTEMI 05/2015 s/p DES to native RCA and SVG-dRCA; occluded ramus-SVG was treated medically). c. neg nuc 10/2015 at [facility].   Chronic diastolic CHF (congestive heart failure) (HCC)    Chronic fatigue    CKD (chronic kidney disease), stage II    DEPRESSION 12/31/2009   [place] DISEASE, CERVICAL 12/31/2009   [place] DISEASE, LUMBAR 12/31/2009   DIVERTICULITIS, HX OF 12/31/2009   Essential hypertension    Gait abnormality 03/25/2017   GLUCOSE INTOLERANCE 12/31/2009   Habitual alcohol use    History of stroke    self reports " they say i have had some pin strokes"    Hypercalcemia    Hyperkalemia    Hyperlipidemia 12/31/2009  Hypothyroidism    MENOPAUSE, EARLY 12/31/2009   NSTEMI (non-ST elevated myocardial  infarction) ([place]) 05/11/2015   Orthostatic hypotension    OSTEOARTHRITIS, HAND    Pacemaker 01/21/2012   MDT Adapta dual chamber   Paroxysmal atrial flutter (HCC)    Persistent atrial fibrillation (HCC)    PVD (peripheral vascular disease), hx stents to bil SFAs 02/2010    Rash, skin     superficial raised red pencil point sized rash bilateral forearms; states " it started when i started the Plavix "    Ruptured left breast implant    Symptomatic sinus bradycardia 01/22/2012   Syncope    a. 11/2015 ? due to medications.   Syncope    reports on 05-10-17 " i passed out 2 months ago in the bathroom and broke some ribs"     Tachy-brady syndrome ([place])    a. s/p MDT PPM 2013.   Tricuspid regurgitation     SURGICAL HISTORY: Past Surgical History:  Procedure Laterality Date   ABDOMINAL AORTOGRAM W/LOWER EXTREMITY Bilateral 02/15/2017   Procedure: ABDOMINAL AORTOGRAM W/LOWER EXTREMITY;  Surgeon: Lorretta Harp, MD;  Location: [facility];  Service: Cardiovascular;  Laterality: Bilateral;  bilat   ABDOMINAL AORTOGRAM W/LOWER EXTREMITY N/A 10/06/2018   Procedure: ABDOMINAL AORTOGRAM W/LOWER EXTREMITY;  Surgeon: Lorretta Harp, MD;  Location: [place] [facility];  Service: Cardiovascular;  Laterality: N/A;   Ablation of typical atrial flutter (CTI line)  09/05/2016   Dr Antonieta Pert at [place]   Removed 01/2018   BREAST IMPLANT REMOVAL Bilateral 01/14/2018   Procedure: REMOVAL BILATERAL BREAST IMPLANTS;  Surgeon: Irene Limbo, MD;  Location: [place];  Service: Plastics;  Laterality: Bilateral;   CAPSULECTOMY Bilateral 01/14/2018   Procedure: CAPSULECTOMY;  Surgeon: Irene Limbo, MD;  Location: [place];  Service: Plastics;  Laterality: Bilateral;   CARDIAC CATHETERIZATION N/A 01/01/2015   Procedure: Left Heart Cath and Coronary Angiography;  Surgeon: Jettie Booze, MD;  Location: [facility];  Service:  Cardiovascular;  Laterality: N/A;   CARDIAC CATHETERIZATION N/A 05/12/2015   Procedure: Left Heart Cath and Coronary Angiography;  Surgeon: Troy Sine, MD;  Location: [place] [facility];  Service: Cardiovascular;  Laterality: N/A;   CARDIAC CATHETERIZATION N/A 05/12/2015   Procedure: Coronary Stent Intervention;  Surgeon: Troy Sine, MD;  Location: [place] [facility];  Service: Cardiovascular;  Laterality: N/A;   CARDIOVERSION N/A 02/01/2015   Procedure: CARDIOVERSION;  Surgeon: Lelon Perla, MD;  Location: [facility];  Service: Cardiovascular;  Laterality: N/A;   CARDIOVERSION N/A 08/30/2015   Procedure: CARDIOVERSION;  Surgeon: Sanda Klein, MD;  Location: [place] [facility];  Service: Cardiovascular;  Laterality: N/A;   CARPAL TUNNEL RELEASE  2008   "right hand/thumb; carpal tunnel repair; got rid of arthritis" (01/21/2012)   CLIPPING OF ATRIAL APPENDAGE N/A 01/04/2015   Procedure: CLIPPING OF ATRIAL APPENDAGE;  Surgeon: Grace Isaac, MD;  Location: [place];  Service: Open Heart Surgery;  Laterality: N/A;   COLONOSCOPY N/A 12/21/2015   Procedure: COLONOSCOPY;  Surgeon: Milus Banister, MD;  Location: [facility];  Service: Endoscopy;  Laterality: N/A;   CORONARY ARTERY BYPASS GRAFT N/A 01/04/2015   Procedure: CORONARY ARTERY BYPASS GRAFTING (CABG) x 3 using left internal mammory artery and greater saphenous vein right leg harvested endoscopically.;  Surgeon: Grace Isaac, MD;  LIMA-LAD, SVG-RI, SVG-PDA   CORONARY STENT INTERVENTION N/A 11/07/2020   Procedure:  CORONARY STENT INTERVENTION;  Surgeon: Early Osmond, MD;  Location: [place] [facility];  Service: Cardiovascular;  Laterality: N/A;   ESOPHAGOGASTRODUODENOSCOPY (EGD) WITH PROPOFOL N/A 12/18/2015   Procedure: ESOPHAGOGASTRODUODENOSCOPY (EGD) WITH PROPOFOL;  Surgeon: Milus Banister, MD;  Location: [facility];  Service: Endoscopy;  Laterality: N/A;   FACELIFT, LOWER 2/3  1995   "mini" (01/21/2012)   GIVENS CAPSULE  STUDY N/A 12/21/2015   Procedure: GIVENS CAPSULE STUDY;  Surgeon: Milus Banister, MD;  Location: [facility];  Service: Endoscopy;  Laterality: N/A;   LEFT HEART CATH AND CORS/GRAFTS ANGIOGRAPHY N/A 11/07/2020   Procedure: LEFT HEART CATH AND CORS/GRAFTS ANGIOGRAPHY;  Surgeon: Early Osmond, MD;  Location: [place] [facility];  Service: Cardiovascular;  Laterality: N/A;   Lower Arterial Examination  10/28/2011   R. SFA stent mild-moderate mixed density plaque with elevated velocities consistent with 50% diameter reduction. L. SFA stent moderate mixed denisty plaque at mid to distal level consistent with 50-69% diameter reduction.   LOWER EXTREMITY ANGIOGRAPHY N/A 02/15/2017   Procedure: LOWER EXTREMITY ANGIOGRAPHY;  Surgeon: Lorretta Harp, MD;  Location: [place] [facility];  Service: Cardiovascular;  Laterality: N/A;   OOPHORECTOMY  ~1979   PARATHYROIDECTOMY N/A 05/13/2017   Procedure: PARATHYROIDECTOMY;  Surgeon: Armandina Gemma, MD;  Location: [facility];  Service: General;  Laterality: N/A;   PARTIAL COLECTOMY  2010   PERIPHERAL ARTERIAL STENT GRAFT  2012; 2012   "LLE; RLE" (01/21/2012)   PERIPHERAL VASCULAR BALLOON ANGIOPLASTY Right 02/15/2017   Procedure: PERIPHERAL VASCULAR BALLOON ANGIOPLASTY;  Surgeon: Lorretta Harp, MD;  Location: [place] [facility];  Service: Cardiovascular;  Laterality: Right;  SFA     PERIPHERAL VASCULAR INTERVENTION Right 10/06/2018   Procedure: PERIPHERAL VASCULAR INTERVENTION;  Surgeon: Lorretta Harp, MD;  Location: [place] [facility];  Service: Cardiovascular;  Laterality: Right;   PERMANENT PACEMAKER INSERTION N/A 01/21/2012   Medtronic Adapta L implanted by Dr Sallyanne Kuster for tachy/brady syndrome   POSTERIOR CERVICAL LAMINECTOMY  1985   TEE WITHOUT CARDIOVERSION N/A 01/04/2015   Procedure: TRANSESOPHAGEAL ECHOCARDIOGRAM (TEE);  Surgeon: Grace Isaac, MD;  Location: [place];  Service: Open Heart Surgery;  Laterality: N/A;   TEE WITHOUT CARDIOVERSION N/A  02/01/2015   Procedure: TRANSESOPHAGEAL ECHOCARDIOGRAM (TEE);  Surgeon: Lelon Perla, MD;  Location: [facility]COPY;  Service: Cardiovascular;  Laterality: N/A;   TEE WITHOUT CARDIOVERSION N/A 08/30/2015   Procedure: TRANSESOPHAGEAL ECHOCARDIOGRAM (TEE);  Surgeon: Sanda Klein, MD;  [facility]COPY;  Service: Cardiovascular;  Laterality: N/A;   VAGINAL HYSTERECTOMY  1975    SOCIAL HISTORY: Social History   Socioeconomic History   Marital status: Widowed    Spouse name: Not on file   Number of children: 3   Years of education: Not on file   Highest education level: Not on file  Occupational History   Occupation: Retired Teacher, 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  Retired.   Social Determinants of Health   Financial Resource Strain: Not on file  Food Insecurity: Not on file  Transportation Needs: Not on file  Physical Activity: Not on file  Stress: Not on file  Social Connections: Not on file  Intimate Partner Violence: Not on file    FAMILY HISTORY: Family History  Problem Relation Age of Onset   Heart disease Mother    Hypertension Mother    Stroke Mother    Stroke Father    Heart disease Father    Heart disease Brother    Hypertension Brother        2 brothers   CVA Brother        2 brothers   Heart attack Brother        2 brothers    ALLERGIES:  is allergic to brilinta [ticagrelor], atorvastatin, clonidine derivatives, crestor [rosuvastatin calcium], exforge  [amlodipine besylate-valsartan], and spironolactone.  MEDICATIONS:  Current Outpatient Medications  Medication Sig Dispense Refill   amLODipine (NORVASC) 5 MG tablet Take 1 tablet (5 mg total) by mouth in the morning and at bedtime. 180 tablet 2   ascorbic acid (VITAMIN C) 250 MG CHEW Chew 750 mg by mouth daily.     aspirin EC 81 MG tablet Take 81 mg by mouth daily.      Biotin 10000 MCG TABS Take 10,000 mcg by mouth daily.     carvedilol (COREG) 6.25 MG tablet TAKE 1 TABLET BY MOUTH 2 TIMES DAILY WITH A MEAL. 180 tablet 3   Cholecalciferol (VITAMIN D) 50 MCG (2000 UT) tablet Take 2,000 Units by mouth daily.     citalopram (CELEXA) 40 MG tablet Take 1 tablet (40 mg total) by mouth daily. 90 tablet 1   clopidogrel (PLAVIX) 75 MG tablet TAKE 1 TABLET (75 MG TOTAL) BY MOUTH DAILY WITH BREAKFAST. 90 tablet 3   Cyanocobalamin (B-12) 5000 MCG CAPS Take 10,000 mcg by mouth daily.     dofetilide (TIKOSYN) 125 MCG capsule TAKE 1 CAPSULE BY MOUTH EVERY 12 HOURS. (GENERIC FOR TIKOSYN) 180 capsule 2   furosemide (LASIX) 40 MG tablet TAKE 1 TABLET BY MOUTH EVERY OTHER DAY 20 tablet 2   furosemide (LASIX) 80 MG tablet Take 1 tablet (80 mg total) by mouth daily. 90 tablet 3   levothyroxine (SYNTHROID, LEVOTHROID) 25 MCG tablet Take 25 mcg by mouth daily before breakfast.      loteprednol (LOTEMAX) 0.5 % ophthalmic suspension Place 2 drops into both eyes See admin instructions. Instill 2 drops into both eyes 2 times a day twice weekly     Magnesium 200 MG TABS Take 200 mg by mouth daily.     Multiple Vitamin (MULTIVITAMIN WITH MINERALS) TABS tablet Take 1 tablet by mouth 2 (two) times daily.     Multiple Vitamins-Minerals (ZINC PO) Take 150 mg by mouth daily.     nitroGLYCERIN (NITROSTAT) 0.4 MG SL tablet Place 1 tablet (0.4 mg total) under the tongue every 5 (five) minutes x 3 doses as needed for chest pain. 25 tablet 2   rosuvastatin (CRESTOR) 40 MG tablet Take 1 tablet (40 mg total) by mouth daily. 90  tablet 3   VITAMIN D-VITAMIN K PO Take 1 capsule by mouth daily.     No current facility-administered medications for this visit.     PHYSICAL EXAMINATION: ECOG PERFORMANCE STATUS: {CHL ONC ECOG PS:[phone]}  There were no vitals filed for this  visit. There were no vitals filed for this visit.  GENERAL:alert, no distress and comfortable SKIN: skin color, texture, turgor are normal, no rashes or significant lesions EYES: normal, conjunctiva are pink and non-injected, sclera clear OROPHARYNX:no exudate, no erythema and lips, buccal mucosa, and tongue normal  NECK: supple, thyroid normal size, non-tender, without nodularity LYMPH:  no palpable lymphadenopathy in the cervical, axillary or inguinal LUNGS: clear to auscultation and percussion with normal breathing effort HEART: regular rate & rhythm and no murmurs and no lower extremity edema ABDOMEN:abdomen soft, non-tender and normal bowel sounds Musculoskeletal:no cyanosis of digits and no clubbing  PSYCH: alert & oriented x 3 with fluent speech NEURO: no focal motor/sensory deficits  LABORATORY DATA:  I have reviewed the data as listed Lab Results  Component Value Date   WBC 8.3 11/05/2020   HGB 15.1 11/05/2020   HCT 44.5 11/05/2020   MCV 87 11/05/2020   PLT 273 11/05/2020     Chemistry      Component Value Date/Time   NA 134 11/05/2020 1539   K 4.9 11/05/2020 1539   CL 88 (L) 11/05/2020 1539   CO2 28 11/05/2020 1539   BUN 34 (H) 11/05/2020 1539   CREATININE 1.72 (H) 11/05/2020 1539   CREATININE 1.03 (H) 10/22/2015 1000      Component Value Date/Time   CALCIUM 10.2 11/05/2020 1539   ALKPHOS 116 06/11/2020 0952   AST 18 06/11/2020 0952   ALT 13 06/11/2020 0952   BILITOT 0.4 06/11/2020 0952       RADIOGRAPHIC STUDIES: I have personally reviewed the radiological images as listed and agreed with the findings in the report. No results found.  All questions were answered. The patient knows to call the clinic  with any problems, questions or concerns. I spent *** minutes in the care of this patient including H and P, review of records, counseling and coordination of care.     Benay Pike, MD 01/22/2021 1:23 PM

## 2021-01-23 ENCOUNTER — Inpatient Hospital Stay: Payer: Medicare HMO

## 2021-01-23 ENCOUNTER — Other Ambulatory Visit: Payer: Self-pay

## 2021-01-23 ENCOUNTER — Telehealth: Payer: Self-pay | Admitting: Oncology

## 2021-01-23 ENCOUNTER — Inpatient Hospital Stay: Payer: Medicare HMO | Attending: Hematology and Oncology | Admitting: Hematology and Oncology

## 2021-01-23 ENCOUNTER — Telehealth: Payer: Self-pay

## 2021-01-23 DIAGNOSIS — D751 Secondary polycythemia: Secondary | ICD-10-CM | POA: Insufficient documentation

## 2021-01-23 DIAGNOSIS — I130 Hypertensive heart and chronic kidney disease with heart failure and stage 1 through stage 4 chronic kidney disease, or unspecified chronic kidney disease: Secondary | ICD-10-CM | POA: Insufficient documentation

## 2021-01-23 DIAGNOSIS — N189 Chronic kidney disease, unspecified: Secondary | ICD-10-CM | POA: Insufficient documentation

## 2021-01-23 NOTE — Telephone Encounter (Signed)
Pt no showed appt. Called pt, she said she forgot appt. I r/s her, she is aware of new appt date and time.

## 2021-01-23 NOTE — Telephone Encounter (Signed)
Attempted to call patient on primary number regarding today's appointment. No answer. Left voicemail providing patient with phone number to reschedule appointments, if needed.

## 2021-01-24 ENCOUNTER — Other Ambulatory Visit: Payer: Self-pay

## 2021-01-24 ENCOUNTER — Ambulatory Visit (HOSPITAL_COMMUNITY): Payer: Medicare HMO | Attending: Cardiovascular Disease

## 2021-01-24 DIAGNOSIS — I5042 Chronic combined systolic (congestive) and diastolic (congestive) heart failure: Secondary | ICD-10-CM | POA: Insufficient documentation

## 2021-01-24 LAB — ECHOCARDIOGRAM LIMITED
Area-P 1/2: 4.29 cm2
Calc EF: 39.8 %
P 1/2 time: 376 msec
S' Lateral: 3.4 cm
Single Plane A2C EF: 40.9 %
Single Plane A4C EF: 36.7 %

## 2021-02-06 ENCOUNTER — Other Ambulatory Visit: Payer: Self-pay | Admitting: Cardiovascular Disease

## 2021-02-06 DIAGNOSIS — I25708 Atherosclerosis of coronary artery bypass graft(s), unspecified, with other forms of angina pectoris: Secondary | ICD-10-CM

## 2021-02-07 ENCOUNTER — Inpatient Hospital Stay: Payer: Medicare HMO

## 2021-02-07 ENCOUNTER — Inpatient Hospital Stay: Payer: Medicare HMO | Admitting: Oncology

## 2021-02-07 ENCOUNTER — Telehealth: Payer: Self-pay | Admitting: *Deleted

## 2021-02-07 ENCOUNTER — Other Ambulatory Visit: Payer: Self-pay

## 2021-02-07 VITALS — BP 130/71 | HR 84 | Temp 98.1°F | Resp 18 | Ht 64.0 in | Wt 141.8 lb

## 2021-02-07 DIAGNOSIS — N189 Chronic kidney disease, unspecified: Secondary | ICD-10-CM | POA: Diagnosis not present

## 2021-02-07 DIAGNOSIS — I130 Hypertensive heart and chronic kidney disease with heart failure and stage 1 through stage 4 chronic kidney disease, or unspecified chronic kidney disease: Secondary | ICD-10-CM | POA: Diagnosis not present

## 2021-02-07 DIAGNOSIS — D751 Secondary polycythemia: Secondary | ICD-10-CM | POA: Diagnosis not present

## 2021-02-07 DIAGNOSIS — D45 Polycythemia vera: Secondary | ICD-10-CM | POA: Diagnosis not present

## 2021-02-07 LAB — CBC WITH DIFFERENTIAL (CANCER CENTER ONLY)
Abs Immature Granulocytes: 0.03 10*3/uL (ref 0.00–0.07)
Basophils Absolute: 0.1 10*3/uL (ref 0.0–0.1)
Basophils Relative: 1 %
Eosinophils Absolute: 0.1 10*3/uL (ref 0.0–0.5)
Eosinophils Relative: 1 %
HCT: 45.8 % (ref 36.0–46.0)
Hemoglobin: 15.6 g/dL — ABNORMAL HIGH (ref 12.0–15.0)
Immature Granulocytes: 0 %
Lymphocytes Relative: 23 %
Lymphs Abs: 2 10*3/uL (ref 0.7–4.0)
MCH: 29 pg (ref 26.0–34.0)
MCHC: 34.1 g/dL (ref 30.0–36.0)
MCV: 85.1 fL (ref 80.0–100.0)
Monocytes Absolute: 0.5 10*3/uL (ref 0.1–1.0)
Monocytes Relative: 6 %
Neutro Abs: 5.9 10*3/uL (ref 1.7–7.7)
Neutrophils Relative %: 69 %
Platelet Count: 267 10*3/uL (ref 150–400)
RBC: 5.38 MIL/uL — ABNORMAL HIGH (ref 3.87–5.11)
RDW: 13.9 % (ref 11.5–15.5)
WBC Count: 8.6 10*3/uL (ref 4.0–10.5)
nRBC: 0 % (ref 0.0–0.2)

## 2021-02-07 NOTE — Progress Notes (Signed)
Reason for the request:    Polycythemia  HPI: I was asked by Dr. Justin Mend to evaluate [name] for the evaluation of polycythemia.  She is a 80 year old woman with history of chronic renal sufficiency, coronary artery disease as well as she other comorbid conditions.  She was evaluated by Dr. Justin Mend for a worsening renal insufficiency and found to have hemoglobin of 16.1 which is slightly above the normal range.  She had normal CBC otherwise including white cell count, platelet count and differential.  Previous CBC in September 2022 showed a hemoglobin of 15.1 and in August 2022 was 15.0.  Previous counts are all within normal range.  Her creatinine in September 2022 was 1.7 with a creatinine clearance of of 30 cc/min.  Repeat laboratory testing in November 2022 showed her creatinine to be 1.3 with a creatinine clearance around 40 cc/min.  She had normal electrolytes, normal SPEP and liver function test.  Clinically, she reports no recent complaints. She denies any thrombosis or bleeding episodes.  She denies any migraines or nosebleeds.  She does report fatigue and occasional dyspnea on exertion which has improved after coronary artery stenting.  She remains active and lives independently.  She continues to drive and attends to activities of daily living.  She does not report any headaches, blurry vision, syncope or seizures. Does not report any fevers, chills or sweats.  Does not report any cough, wheezing or hemoptysis.  Does not report any chest pain, palpitation, orthopnea or leg edema.  Does not report any nausea, vomiting or abdominal pain.  Does not report any constipation or diarrhea.  Does not report any skeletal complaints.    Does not report frequency, urgency or hematuria.  Does not report any skin rashes or lesions. Does not report any heat or cold intolerance.  Does not report any lymphadenopathy or petechiae.  Does not report any anxiety or depression.  Remaining review of systems is negative.      Past Medical History:  Diagnosis Date   ANXIETY 12/31/2009   Aortic insufficiency    a. mod by echo 2017.   CAD (coronary artery disease)    a. s/p CABGx3 and LAA clipping in 12/2014, NSTEMI 05/2015 s/p DES to native RCA and SVG-dRCA; occluded ramus-SVG was treated medically). c. neg [facility]uc 10/2015 [facility].   Chronic diastolic CHF (congestive heart failure) (HCC)    Chronic fatigue    CKD (chronic kidney disease), stage II    DEPRESSION 12/31/2009   [place] DISEASE, CERVICAL 12/31/2009   [place] DISEASE, LUMBAR 12/31/2009   DIVERTICULITIS, HX OF 12/31/2009   Essential hypertension    Gait abnormality 03/25/2017   GLUCOSE INTOLERANCE 12/31/2009   Habitual alcohol use    History of stroke    self reports " they say i have had some pin strokes"    Hypercalcemia    Hyperkalemia    Hyperlipidemia 12/31/2009   Hypothyroidism    MENOPAUSE, EARLY 12/31/2009   NSTEMI (non-ST elevated myocardial infarction) ([place]) 05/11/2015   Orthostatic hypotension    OSTEOARTHRITIS, HAND    Pacemaker 01/21/2012   MDT Adapta dual chamber   Paroxysmal atrial flutter (HCC)    Persistent atrial fibrillation (HCC)    PVD (peripheral vascular disease), hx stents to bil SFAs 02/2010    Rash, skin     superficial raised red pencil point sized rash bilateral forearms; states " it started when i started the Plavix "    Ruptured left breast implant    Symptomatic sinus bradycardia 01/22/2012  Syncope    a. 11/2015 ? due to medications.   Syncope    reports on 05-10-17 " i passed out 2 months ago in the bathroom and broke some ribs"     Tachy-brady syndrome ([place])    a. s/p MDT PPM 2013.   Tricuspid regurgitation   :   Past Surgical History:  Procedure Laterality Date   ABDOMINAL AORTOGRAM W/LOWER EXTREMITY Bilateral 02/15/2017   Procedure: ABDOMINAL AORTOGRAM W/LOWER EXTREMITY;  Surgeon: Lorretta Harp, MD;  Location: [place] [facility];  Service: Cardiovascular;  Laterality: Bilateral;  bilat   ABDOMINAL  AORTOGRAM W/LOWER EXTREMITY N/A 10/06/2018   Procedure: ABDOMINAL AORTOGRAM W/LOWER EXTREMITY;  Surgeon: Lorretta Harp, MD;  Location: [place] [facility];  Service: Cardiovascular;  Laterality: N/A;   Ablation of typical atrial flutter (CTI line)  09/05/2016   Dr Antonieta Pert at [place]   Removed 01/2018   BREAST IMPLANT REMOVAL Bilateral 01/14/2018   Procedure: REMOVAL BILATERAL BREAST IMPLANTS;  Surgeon: Irene Limbo, MD;  Location: [place];  Service: Plastics;  Laterality: Bilateral;   CAPSULECTOMY Bilateral 01/14/2018   Procedure: CAPSULECTOMY;  Surgeon: Irene Limbo, MD;  Location: [place];  Service: Plastics;  Laterality: Bilateral;   CARDIAC CATHETERIZATION N/A 01/01/2015   Procedure: Left Heart Cath and Coronary Angiography;  Surgeon: Jettie Booze, MD;  Location: [place] [facility];  Service: Cardiovascular;  Laterality: N/A;   CARDIAC CATHETERIZATION N/A 05/12/2015   Procedure: Left Heart Cath and Coronary Angiography;  Surgeon: Troy Sine, MD;  Location: [place] [facility];  Service: Cardiovascular;  Laterality: N/A;   CARDIAC CATHETERIZATION N/A 05/12/2015   Procedure: Coronary Stent Intervention;  Surgeon: Troy Sine, MD;  Location: [place] [facility];  Service: Cardiovascular;  Laterality: N/A;   CARDIOVERSION N/A 02/01/2015   Procedure: CARDIOVERSION;  Surgeon: Lelon Perla, MD;  Location: [facility];  Service: Cardiovascular;  Laterality: N/A;   CARDIOVERSION N/A 08/30/2015   Procedure: CARDIOVERSION;  Surgeon: Sanda Klein, MD;  Location: [place] [facility];  Service: Cardiovascular;  Laterality: N/A;   CARPAL TUNNEL RELEASE  2008   "right hand/thumb; carpal tunnel repair; got rid of arthritis" (01/21/2012)   CLIPPING OF ATRIAL APPENDAGE N/A 01/04/2015   Procedure: CLIPPING OF ATRIAL APPENDAGE;  Surgeon: Grace Isaac, MD;  Location: [place];  Service: Open Heart Surgery;  Laterality:  N/A;   COLONOSCOPY N/A 12/21/2015   Procedure: COLONOSCOPY;  Surgeon: Milus Banister, MD;  Location: [facility];  Service: Endoscopy;  Laterality: N/A;   CORONARY ARTERY BYPASS GRAFT N/A 01/04/2015   Procedure: CORONARY ARTERY BYPASS GRAFTING (CABG) x 3 using left internal mammory artery and greater saphenous vein right leg harvested endoscopically.;  Surgeon: Grace Isaac, MD;  LIMA-LAD, SVG-RI, SVG-PDA   CORONARY STENT INTERVENTION N/A 11/07/2020   Procedure: CORONARY STENT INTERVENTION;  Surgeon: Early Osmond, MD;  Location: [place] [facility];  Service: Cardiovascular;  Laterality: N/A;   ESOPHAGOGASTRODUODENOSCOPY (EGD) WITH PROPOFOL N/A 12/18/2015   Procedure: ESOPHAGOGASTRODUODENOSCOPY (EGD) WITH PROPOFOL;  Surgeon: Milus Banister, MD;  Location: [facility];  Service: Endoscopy;  Laterality: N/A;   FACELIFT, LOWER 2/3  1995   "mini" (01/21/2012)   GIVENS CAPSULE STUDY N/A 12/21/2015   Procedure: GIVENS CAPSULE STUDY;  Surgeon: Milus Banister, MD;  Location: [facility];  Service: Endoscopy;  Laterality: N/A;   LEFT HEART CATH AND CORS/GRAFTS ANGIOGRAPHY N/A 11/07/2020   Procedure: LEFT HEART  CATH AND CORS/GRAFTS ANGIOGRAPHY;  Surgeon: Early Osmond, MD;  Location: [place] [facility];  Service: Cardiovascular;  Laterality: N/A;   Lower Arterial Examination  10/28/2011   R. SFA stent mild-moderate mixed density plaque with elevated velocities consistent with 50% diameter reduction. L. SFA stent moderate mixed denisty plaque at mid to distal level consistent with 50-69% diameter reduction.   LOWER EXTREMITY ANGIOGRAPHY N/A 02/15/2017   Procedure: LOWER EXTREMITY ANGIOGRAPHY;  Surgeon: Lorretta Harp, MD;  Location: [place] [facility];  Service: Cardiovascular;  Laterality: N/A;   OOPHORECTOMY  ~1979   PARATHYROIDECTOMY N/A 05/13/2017   Procedure: PARATHYROIDECTOMY;  Surgeon: Armandina Gemma, MD;  Location: [facility];  Service: General;  Laterality: N/A;   PARTIAL COLECTOMY  2010    PERIPHERAL ARTERIAL STENT GRAFT  2012; 2012   "LLE; RLE" (01/21/2012)   PERIPHERAL VASCULAR BALLOON ANGIOPLASTY Right 02/15/2017   Procedure: PERIPHERAL VASCULAR BALLOON ANGIOPLASTY;  Surgeon: Lorretta Harp, MD;  Location: [place] [facility];  Service: Cardiovascular;  Laterality: Right;  SFA     PERIPHERAL VASCULAR INTERVENTION Right 10/06/2018   Procedure: PERIPHERAL VASCULAR INTERVENTION;  Surgeon: Lorretta Harp, MD;  Location: [place] [facility];  Service: Cardiovascular;  Laterality: Right;   PERMANENT PACEMAKER INSERTION N/A 01/21/2012   Medtronic Adapta L implanted by Dr Sallyanne Kuster for tachy/brady syndrome   POSTERIOR CERVICAL LAMINECTOMY  1985   TEE WITHOUT CARDIOVERSION N/A 01/04/2015   Procedure: TRANSESOPHAGEAL ECHOCARDIOGRAM (TEE);  Surgeon: Grace Isaac, MD;  Location: [place];  Service: Open Heart Surgery;  Laterality: N/A;   TEE WITHOUT CARDIOVERSION N/A 02/01/2015   Procedure: TRANSESOPHAGEAL ECHOCARDIOGRAM (TEE);  Surgeon: Lelon Perla, MD;  Location: [facility]COPY;  Service: Cardiovascular;  Laterality: N/A;   TEE WITHOUT CARDIOVERSION N/A 08/30/2015   Procedure: TRANSESOPHAGEAL ECHOCARDIOGRAM (TEE);  Surgeon: Sanda Klein, MD;  Location: [facility] ENDOSCOPY;  Service: Cardiovascular;  Laterality: N/A;   VAGINAL HYSTERECTOMY  1975  :   Current Outpatient Medications:    clopidogrel (PLAVIX) 75 MG tablet, TAKE 1 TABLET BY MOUTH DAILY WITH BREAKFAST., Disp: 90 tablet, Rfl: 3   amLODipine (NORVASC) 5 MG tablet, TAKE 1 TABLET (5 MG TOTAL) BY MOUTH IN THE MORNING AND AT BEDTIME., Disp: 180 tablet, Rfl: 2   ascorbic acid (VITAMIN C) 250 MG CHEW, Chew 750 mg by mouth daily., Disp: , Rfl:    aspirin EC 81 MG tablet, Take 81 mg by mouth daily. , Disp: , Rfl:    Biotin 10000 MCG TABS, Take 10,000 mcg by mouth daily., Disp: , Rfl:    carvedilol (COREG) 6.25 MG tablet, TAKE 1 TABLET BY MOUTH 2 TIMES DAILY WITH A MEAL., Disp: 180 tablet, Rfl: 3   Cholecalciferol (VITAMIN D) 50 MCG  (2000 UT) tablet, Take 2,000 Units by mouth daily., Disp: , Rfl:    citalopram (CELEXA) 40 MG tablet, Take 1 tablet (40 mg total) by mouth daily., Disp: 90 tablet, Rfl: 1   Cyanocobalamin (B-12) 5000 MCG CAPS, Take 10,000 mcg by mouth daily., Disp: , Rfl:    dofetilide (TIKOSYN) 125 MCG capsule, TAKE 1 CAPSULE BY MOUTH EVERY 12 HOURS. (GENERIC FOR TIKOSYN), Disp: 180 capsule, Rfl: 2   furosemide (LASIX) 40 MG tablet, TAKE 1 TABLET BY MOUTH EVERY OTHER DAY, Disp: 20 tablet, Rfl: 2   furosemide (LASIX) 80 MG tablet, Take 1 tablet (80 mg total) by mouth daily., Disp: 90 tablet, Rfl: 3   levothyroxine (SYNTHROID, LEVOTHROID) 25 MCG tablet, Take 25 mcg by mouth daily before  breakfast. , Disp: , Rfl:    loteprednol (LOTEMAX) 0.5 % ophthalmic suspension, Place 2 drops into both eyes See admin instructions. Instill 2 drops into both eyes 2 times a day twice weekly, Disp: , Rfl:    Magnesium 200 MG TABS, Take 200 mg by mouth daily., Disp: , Rfl:    Multiple Vitamin (MULTIVITAMIN WITH MINERALS) TABS tablet, Take 1 tablet by mouth 2 (two) times daily., Disp: , Rfl:    Multiple Vitamins-Minerals (ZINC PO), Take 150 mg by mouth daily., Disp: , Rfl:    nitroGLYCERIN (NITROSTAT) 0.4 MG SL tablet, Place 1 tablet (0.4 mg total) under the tongue every 5 (five) minutes x 3 doses as needed for chest pain., Disp: 25 tablet, Rfl: 2   rosuvastatin (CRESTOR) 40 MG tablet, Take 1 tablet (40 mg total) by mouth daily., Disp: 90 tablet, Rfl: 3   VITAMIN D-VITAMIN K PO, Take 1 capsule by mouth daily., Disp: , Rfl: :   Allergies  Allergen Reactions   Brilinta [Ticagrelor] Shortness Of Breath   Atorvastatin Other (See Comments)    Possible cause of fatigue/malaise   Clonidine Derivatives Other (See Comments)    Lowers heart rate   Crestor [Rosuvastatin Calcium] Other (See Comments)    Joint/muscle aches   Exforge [Amlodipine Besylate-Valsartan] Itching and Rash   Spironolactone Other (See Comments)    Contraindicated  with history of hyperkalemia  :   Family History  Problem Relation Age of Onset   Heart disease Mother    Hypertension Mother    Stroke Mother    Stroke Father    Heart disease Father    Heart disease Brother    Hypertension Brother        2 brothers   CVA Brother        2 brothers   Heart attack Brother        2 brothers  :   Social History   Socioeconomic History   Marital status: Widowed    Spouse name: Not on file   Number of children: 3   Years of education: Not on file   Highest education level: Not on file  Occupational History   Occupation: Retired Teacher, 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  Retired.   Social Determinants of Health   Financial Resource Strain: Not on file  Food Insecurity: Not on file  Transportation Needs: Not on file  Physical Activity: Not on file  Stress: Not on file  Social Connections: Not on file  Intimate Partner Violence: Not on file  :  Pertinent items are noted in HPI.  Exam: Blood pressure 130/71, pulse 84, temperature 98.1 F (36.7 C), temperature source Temporal, resp. rate 18, height 5\' 4"  (1.626 m), weight 141 lb 12.8 oz (64.3 kg), SpO2 97 %. ECOG 1 General appearance: alert and cooperative appeared without distress.  Head: atraumatic without any abnormalities. Eyes: conjunctivae/corneas clear. PERRL.  Sclera anicteric. Throat: lips, mucosa, and tongue normal; without oral thrush or ulcers. Resp: clear to auscultation  bilaterally without rhonchi, wheezes or dullness to percussion. Cardio: regular rate and rhythm, S1, S2 normal, no murmur, click, rub or gallop GI: soft, non-tender; bowel sounds normal; no masses,  no organomegaly Skin: Skin color, texture, turgor normal. No rashes or lesions Lymph nodes: Cervical, supraclavicular, and axillary nodes normal. Neurologic: Grossly normal without any motor, sensory or deep tendon reflexes. Musculoskeletal: No joint deformity or effusion.   US RENAL  Result Date: 01/22/2021 CLINICAL DATA:  Chronic renal disease. EXAM: RENAL / URINARY TRACT ULTRASOUND COMPLETE COMPARISON:  None. FINDINGS: Right Kidney: Renal measurements: 6.2 cm x 2.5 cm x 3.0 cm = volume: 23.6 mL. Echogenicity within normal limits. No mass or hydronephrosis visualized. Left Kidney: Renal measurements: 11.0 cm x 4.1 cm x 5.6 cm = volume: 130.0 mL. Echogenicity within normal limits. No mass or hydronephrosis visualized. Bladder: Appears normal for degree of bladder distention. Other: None. IMPRESSION: Atrophic right kidney. Electronically Signed   By: Virgina Norfolk M.D.   On: 01/22/2021 21:14   ECHOCARDIOGRAM LIMITED  Result Date: 01/24/2021    ECHOCARDIOGRAM LIMITED REPORT   Patient Name:   [name] Date of Exam: 01/24/2021 Medical Rec #:  [id number]         Height:       64.0 in Accession #:    [id number]        Weight:       143.4 lb Date of Birth:  1940/11/25          BSA:          1.698 m Patient Age:    51 years          BP:           142/80 mmHg Patient Gender: F                 HR:           84 bpm. Exam Location:  [facility] Procedure: Limited Echo, Limited Color Doppler and Cardiac Doppler Indications:    CHF-combined systolic and diastolic, Follow up LV Ejection                 Fraction  History:        Patient has prior history of Echocardiogram examinations, most                 recent 09/24/2020. Previous Myocardial Infarction and CAD, Prior                 CABG,  Arrythmias:Atrial Fibrillation; Risk Factors:Dyslipidemia.  Sonographer:    Mikki Santee RDCS Referring Phys: [place]  1. Hypokinesis of the basal to mid inferoseptum and basal to mid inferior myocardium. Left ventricular ejection fraction, by estimation, is 40 to 45%. The left ventricle has mildly decreased function. The left ventricle demonstrates regional wall motion  abnormalities (see scoring diagram/findings for description). Left ventricular diastolic parameters are consistent with Grade II diastolic dysfunction (pseudonormalization).  2. Right ventricular systolic function is normal. The right ventricular size is normal. There is normal pulmonary artery systolic pressure.  3. The mitral valve is normal in structure. No evidence of mitral valve regurgitation. No evidence of mitral stenosis.  4. The aortic valve is normal in structure. Aortic valve regurgitation is mild to moderate. No aortic stenosis is present. Aortic regurgitation PHT  measures 376 msec.  5. The inferior vena cava is normal in size with greater than 50% respiratory variability, suggesting right atrial pressure of 3 mmHg. FINDINGS  Left Ventricle: Hypokinesis of the basal to mid inferoseptum and basal to mid inferior myocardium. Left ventricular ejection fraction, by estimation, is 40 to 45%. The left ventricle has mildly decreased function. The left ventricle demonstrates regional wall motion abnormalities. The left ventricular internal cavity size was normal in size. There is no left ventricular hypertrophy. Left ventricular diastolic parameters are consistent with Grade II diastolic dysfunction (pseudonormalization). Indeterminate filling pressures. Right Ventricle: The right ventricular size is normal. No increase in right ventricular wall thickness. Right ventricular systolic function is normal. There is normal pulmonary artery systolic pressure. The tricuspid regurgitant velocity is 2.44 m/s, and  with an  assumed right atrial pressure of 3 mmHg, the estimated right ventricular systolic pressure is 37.8 mmHg. Left Atrium: Left atrial size was normal in size. Right Atrium: Right atrial size was normal in size. Pericardium: There is no evidence of pericardial effusion. Mitral Valve: The mitral valve is normal in structure. No evidence of mitral valve stenosis. Tricuspid Valve: The tricuspid valve is normal in structure. Tricuspid valve regurgitation is mild . No evidence of tricuspid stenosis. Aortic Valve: The aortic valve is normal in structure. Aortic valve regurgitation is mild to moderate. Aortic regurgitation PHT measures 376 msec. No aortic stenosis is present. Pulmonic Valve: The pulmonic valve was normal in structure. Pulmonic valve regurgitation is not visualized. No evidence of pulmonic stenosis. Aorta: The aortic root is normal in size and structure. Venous: The inferior vena cava is normal in size with greater than 50% respiratory variability, suggesting right atrial pressure of 3 mmHg. IAS/Shunts: No atrial level shunt detected by color flow Doppler. LEFT VENTRICLE PLAX 2D LVIDd:         4.50 cm     Diastology LVIDs:         3.40 cm     LV e' medial:    5.44 cm/s LV PW:         1.00 cm     LV E/e' medial:  13.4 LV IVS:        1.00 cm     LV e' lateral:   6.42 cm/s                            LV E/e' lateral: 11.3  LV Volumes (MOD) LV vol d, MOD A2C: 59.2 ml LV vol d, MOD A4C: 79.2 ml LV vol s, MOD A2C: 35.0 ml LV vol s, MOD A4C: 50.1 ml LV SV MOD A2C:     24.2 ml LV SV MOD A4C:     79.2 ml LV SV MOD BP:      27.9 ml LEFT ATRIUM         Index LA diam:    3.30 cm 1.94 cm/m  AORTIC VALVE AI PHT:      376 msec MITRAL VALVE               TRICUSPID VALVE MV Area (PHT): 4.29 cm    TR Peak grad:   23.8 mmHg MV Decel Time: 177 msec    TR Vmax:        244.00 cm/s MV E velocity: 72.80 cm/s MV A velocity: 76.00 cm/s MV E/A ratio:  0.96 Skeet Latch MD Electronically signed by Skeet Latch MD Signature  Date/Time: 01/24/2021/5:15:37 PM  Final     Assessment and Plan:    80 year old woman with:  1.  Elevated hemoglobin noted in November 2022 after she was found to have a hemoglobin of 16.1 and a normal CBC otherwise.  She had normal counts previously with a hemoglobin that have ranged between 14 and 15 in the last 24 months.  This is in the setting of chronic renal insufficiency and a creatinine clearance around 40 cc/min.  The differential diagnosis of these findings were discussed at this time.  Given her chronic renal insufficiency and persistent normal hemoglobin it is reasonable to evaluate for possible myeloproliferative disorder.  Secondary polycythemia is more likely at this time rather than polycythemia vera.  We will repeat laboratory testing today including a myeloproliferative disorder panel to evaluate at this time.  If this evaluation is negative, no further evaluation or intervention is needed.  From a management standpoint, I see no need for phlebotomy or cytoreductive therapy unless polycythemia vera is confirmed.  She is agreeable and understands this plan.  All her questions were answered.  2.  Follow-up: Will be determined pending the results of her testing today.   45  minutes were dedicated to this visit. The time was spent on reviewing laboratory data, discussing treatment options, discussing differential diagnosis and answering questions regarding future plan.     A copy of this consult has been forwarded to the requesting physician.

## 2021-02-07 NOTE — Telephone Encounter (Signed)
PC to patient, no answer, left VM - she needs to return next week for another lab draw.  One of the specimens drawn today is to be sent out, [employer] not picking up today because of holiday so specimen needs to be redrawn.  Informed patient scheduling will be calling her to inform her of lab appointment.  Instructed patient to call this office with any questions/concerns.  [phone]   Scheduling message sent.

## 2021-02-08 LAB — ERYTHROPOIETIN: Erythropoietin: 9.3 m[IU]/mL (ref 2.6–18.5)

## 2021-02-12 ENCOUNTER — Telehealth: Payer: Self-pay | Admitting: Oncology

## 2021-02-12 NOTE — Telephone Encounter (Signed)
Scheduled per 01/04 scheduled message, patient has been called and notified of 01/05 appointment.

## 2021-02-13 ENCOUNTER — Inpatient Hospital Stay: Payer: Medicare HMO | Attending: Hematology and Oncology

## 2021-02-13 ENCOUNTER — Other Ambulatory Visit: Payer: Self-pay | Admitting: Oncology

## 2021-02-13 ENCOUNTER — Other Ambulatory Visit: Payer: Self-pay

## 2021-02-13 DIAGNOSIS — D45 Polycythemia vera: Secondary | ICD-10-CM

## 2021-02-14 ENCOUNTER — Other Ambulatory Visit: Payer: Medicare HMO

## 2021-02-18 LAB — JAK2 (INCLUDING V617F AND EXON 12), MPL,& CALR-NEXT GEN SEQ

## 2021-02-23 ENCOUNTER — Encounter: Payer: Self-pay | Admitting: Oncology

## 2021-02-24 ENCOUNTER — Telehealth: Payer: Self-pay

## 2021-02-24 NOTE — Telephone Encounter (Signed)
Pt advised.

## 2021-02-28 DIAGNOSIS — Z20822 Contact with and (suspected) exposure to covid-19: Secondary | ICD-10-CM | POA: Diagnosis not present

## 2021-03-09 ENCOUNTER — Other Ambulatory Visit: Payer: Self-pay | Admitting: Emergency Medicine

## 2021-03-18 ENCOUNTER — Encounter: Payer: Self-pay | Admitting: Cardiovascular Disease

## 2021-04-07 ENCOUNTER — Encounter: Payer: Self-pay | Admitting: Emergency Medicine

## 2021-04-07 ENCOUNTER — Other Ambulatory Visit: Payer: Self-pay

## 2021-04-07 ENCOUNTER — Ambulatory Visit (INDEPENDENT_AMBULATORY_CARE_PROVIDER_SITE_OTHER): Payer: Medicare HMO | Admitting: Emergency Medicine

## 2021-04-07 VITALS — BP 122/66 | HR 79 | Temp 98.7°F | Ht 64.0 in | Wt 146.5 lb

## 2021-04-07 DIAGNOSIS — M489 Spondylopathy, unspecified: Secondary | ICD-10-CM | POA: Diagnosis not present

## 2021-04-07 DIAGNOSIS — Z7901 Long term (current) use of anticoagulants: Secondary | ICD-10-CM | POA: Diagnosis not present

## 2021-04-07 DIAGNOSIS — Z950 Presence of cardiac pacemaker: Secondary | ICD-10-CM

## 2021-04-07 DIAGNOSIS — M542 Cervicalgia: Secondary | ICD-10-CM

## 2021-04-07 DIAGNOSIS — I25810 Atherosclerosis of coronary artery bypass graft(s) without angina pectoris: Secondary | ICD-10-CM

## 2021-04-07 DIAGNOSIS — M545 Low back pain, unspecified: Secondary | ICD-10-CM | POA: Insufficient documentation

## 2021-04-07 DIAGNOSIS — I5032 Chronic diastolic (congestive) heart failure: Secondary | ICD-10-CM

## 2021-04-07 DIAGNOSIS — G8929 Other chronic pain: Secondary | ICD-10-CM | POA: Diagnosis not present

## 2021-04-07 NOTE — Patient Instructions (Signed)
Health Maintenance After Age 81 After age 81, you are at a higher risk for certain long-term diseases and infections as well as injuries from falls. Falls are a major cause of broken bones and head injuries in people who are older than age 81. Getting regular preventive care can help to keep you healthy and well. Preventive care includes getting regular testing and making lifestyle changes as recommended by your health care provider. Talk with your health care provider about: Which screenings and tests you should have. A screening is a test that checks for a disease when you have no symptoms. A diet and exercise plan that is right for you. What should I know about screenings and tests to prevent falls? Screening and testing are the best ways to find a health problem early. Early diagnosis and treatment give you the best chance of managing medical conditions that are common after age 81. Certain conditions and lifestyle choices may make you more likely to have a fall. Your health care provider may recommend: Regular vision checks. Poor vision and conditions such as cataracts can make you more likely to have a fall. If you wear glasses, make sure to get your prescription updated if your vision changes. Medicine review. Work with your health care provider to regularly review all of the medicines you are taking, including over-the-counter medicines. Ask your health care provider about any side effects that may make you more likely to have a fall. Tell your health care provider if any medicines that you take make you feel dizzy or sleepy. Strength and balance checks. Your health care provider may recommend certain tests to check your strength and balance while standing, walking, or changing positions. Foot health exam. Foot pain and numbness, as well as not wearing proper footwear, can make you more likely to have a fall. Screenings, including: Osteoporosis screening. Osteoporosis is a condition that causes  the bones to get weaker and break more easily. Blood pressure screening. Blood pressure changes and medicines to control blood pressure can make you feel dizzy. Depression screening. You may be more likely to have a fall if you have a fear of falling, feel depressed, or feel unable to do activities that you used to do. Alcohol use screening. Using too much alcohol can affect your balance and may make you more likely to have a fall. Follow these instructions at home: Lifestyle Do not drink alcohol if: Your health care provider tells you not to drink. If you drink alcohol: Limit how much you have to: 0-1 drink a day for women. 0-2 drinks a day for men. Know how much alcohol is in your drink. In the [place] one drink equals one 12 oz bottle of beer (355 mL), one 5 oz glass of wine (148 mL), or one 1 oz glass of hard liquor (44 mL). Do not use any products that contain nicotine or tobacco. These products include cigarettes, chewing tobacco, and vaping devices, such as e-cigarettes. If you need help quitting, ask your health care provider. Activity  Follow a regular exercise program to stay fit. This will help you maintain your balance. Ask your health care provider what types of exercise are appropriate for you. If you need a cane or walker, use it as recommended by your health care provider. Wear supportive shoes that have nonskid soles. Safety  Remove any tripping hazards, such as rugs, cords, and clutter. Install safety equipment such as grab bars in bathrooms and safety rails on stairs. Keep rooms and walkways   well-lit. General instructions Talk with your health care provider about your risks for falling. Tell your health care provider if: You fall. Be sure to tell your health care provider about all falls, even ones that seem minor. You feel dizzy, tiredness (fatigue), or off-balance. Take over-the-counter and prescription medicines only as told by your health care provider. These include  supplements. Eat a healthy diet and maintain a healthy weight. A healthy diet includes low-fat dairy products, low-fat (lean) meats, and fiber from whole grains, beans, and lots of fruits and vegetables. Stay current with your vaccines. Schedule regular health, dental, and eye exams. Summary Having a healthy lifestyle and getting preventive care can help to protect your health and wellness after age 81. Screening and testing are the best way to find a health problem early and help you avoid having a fall. Early diagnosis and treatment give you the best chance for managing medical conditions that are more common for people who are older than age 81. Falls are a major cause of broken bones and head injuries in people who are older than age 81. Take precautions to prevent a fall at home. Work with your health care provider to learn what changes you can make to improve your health and wellness and to prevent falls. This information is not intended to replace advice given to you by your health care provider. Make sure you discuss any questions you have with your health care provider. Document Revised: 06/17/2020 Document Reviewed: 06/17/2020 [employer] Patient Education  2022 [employer]  

## 2021-04-07 NOTE — Assessment & Plan Note (Signed)
Advanced osteoarthritis and disc disease of lumbar spine.  Not a surgical candidate. We will try physical therapy. Advised to take Tylenol as needed.  Avoid NSAIDs due to chronic kidney disease.

## 2021-04-07 NOTE — Assessment & Plan Note (Signed)
Degenerative disc disease with advanced osteoarthritis. Not a good surgical candidate. We will try physical therapy.

## 2021-04-07 NOTE — Telephone Encounter (Signed)
Thank you! We will stick with that plan then

## 2021-04-07 NOTE — Progress Notes (Signed)
[name] 81 y.o.   Chief Complaint  Patient presents with   Referral    Referral to Physical therapy for back and neck pain   Back Pain   Neck Pain    HISTORY OF PRESENT ILLNESS: This is a 81 y.o. female with history of chronic back and neck pain.  Has seen orthopedist and neurosurgery in the past year.  Does not want surgical option.  Wants to try physical therapy.  Requesting referral. No other complaints or medical concerns today.  Back Pain Pertinent negatives include no dysuria, fever or headaches.  Neck Pain  Pertinent negatives include no fever or headaches.    Prior to Admission medications   Medication Sig Start Date End Date Taking? Authorizing Provider  amLODipine (NORVASC) 5 MG tablet TAKE 1 TABLET (5 MG TOTAL) BY MOUTH IN THE MORNING AND AT BEDTIME. 02/06/21  Yes Croitoru, Mihai, MD  ascorbic acid (VITAMIN C) 250 MG CHEW Chew 750 mg by mouth daily.   Yes [provider]  aspirin EC 81 MG tablet Take 81 mg by mouth daily.    Yes [provider]  Biotin 10000 MCG TABS Take 10,000 mcg by mouth daily.   Yes [provider]  carvedilol (COREG) 6.25 MG tablet TAKE 1 TABLET BY MOUTH 2 TIMES DAILY WITH A MEAL. 11/07/20  Yes Croitoru, Mihai, MD  Cholecalciferol (VITAMIN D) 50 MCG (2000 UT) tablet Take 2,000 Units by mouth daily.   Yes [provider]  citalopram (CELEXA) 40 MG tablet TAKE 1 TABLET BY MOUTH EVERY DAY 03/10/21  Yes Murriel Holwerda, Ines Bloomer, MD  clopidogrel (PLAVIX) 75 MG tablet TAKE 1 TABLET BY MOUTH DAILY WITH BREAKFAST. 02/06/21  Yes Croitoru, Mihai, MD  Cyanocobalamin (B-12) 5000 MCG CAPS Take 10,000 mcg by mouth daily.   Yes [provider]  dofetilide (TIKOSYN) 125 MCG capsule TAKE 1 CAPSULE BY MOUTH EVERY 12 HOURS. ([place]) 12/18/20  Yes Croitoru, Mihai, MD  furosemide (LASIX) 40 MG tablet TAKE 1 TABLET BY MOUTH EVERY OTHER DAY Patient taking differently: 20 mg as needed (as needed for edema).  11/13/20  Yes Croitoru, Mihai, MD  furosemide (LASIX) 80 MG tablet Take 1 tablet (80 mg total) by mouth daily. 10/23/20  Yes Croitoru, Mihai, MD  levothyroxine (SYNTHROID, LEVOTHROID) 25 MCG tablet Take 25 mcg by mouth daily before breakfast.  04/03/16  Yes [provider]  loteprednol (LOTEMAX) 0.5 % ophthalmic suspension Place 2 drops into both eyes See admin instructions. Instill 2 drops into both eyes 2 times a day twice weekly   Yes [provider]  Magnesium 200 MG TABS Take 200 mg by mouth daily.   Yes [provider]  Multiple Vitamin (MULTIVITAMIN WITH MINERALS) TABS tablet Take 1 tablet by mouth 2 (two) times daily.   Yes [provider]  Multiple Vitamins-Minerals (ZINC PO) Take 150 mg by mouth daily.   Yes [provider]  nitroGLYCERIN (NITROSTAT) 0.4 MG SL tablet Place 1 tablet (0.4 mg total) under the tongue every 5 (five) minutes x 3 doses as needed for chest pain. 05/15/15  Yes Lyda Jester M, PA-C  rosuvastatin (CRESTOR) 40 MG tablet Take 1 tablet (40 mg total) by mouth daily. 11/07/20 11/02/21 Yes Cheryln Manly, NP  VITAMIN D-VITAMIN K PO Take 1 capsule by mouth daily.   Yes [provider]    Allergies  Allergen Reactions   Brilinta [Ticagrelor] Shortness Of Breath   Atorvastatin Other (See Comments)    Possible  cause of fatigue/malaise   Clonidine Derivatives Other (See Comments)    Lowers heart rate   Crestor [Rosuvastatin Calcium] Other (See Comments)    Joint/muscle aches   Exforge [Amlodipine Besylate-Valsartan] Itching and Rash   Spironolactone Other (See Comments)    Contraindicated with history of hyperkalemia    Patient Active Problem List   Diagnosis Date Noted   Exertional angina ([place])    Renal artery stenosis ([place]) 06/26/2020   Cervical spine disease 05/30/2020   Statin intolerance 11/22/2019   Aortic atherosclerosis ([place]) 11/22/2019   Hypothyroidism (acquired) 02/08/2018   Coronary artery  disease involving coronary bypass graft of native heart without angina pectoris 10/22/2017   Dyslipidemia 10/22/2017   Cerebrovascular small vessel disease 04/27/2017   Cerebrovascular disease 03/25/2017   Claudication in peripheral vascular disease ([place]) 02/15/2017   Hyperparathyroidism ([place]) 05/13/2016   Cardiac pacemaker in situ 01/10/2016   Benign neoplasm of ascending colon    Long term current use of anticoagulant    CKD (chronic kidney disease), stage II    Chronic diastolic heart failure ([place]) 10/19/2015   Atypical atrial flutter (H[facility])    Paroxysmal atrial flutter ([place]) 08/23/2015   Chronic fatigue 06/27/2015   Stented coronary artery    S/P CABG x 3 01/04/15 01/10/2015   CAD (coronary artery disease) 01/04/2015   PAF (paroxysmal atrial fibrillation) ([place]) 01/22/2012   Sinus node dysfunction ([place]) 01/22/2012   S/P placement of cardiac pacemaker, medtronic adapta 01/21/12 01/22/2012   PVD (peripheral vascular disease), hx stents to bil SFAs 02/2010 01/22/2012   Hyperlipidemia 12/31/2009   Anxiety 12/31/2009   DEPRESSION 12/31/2009   Essential hypertension 12/31/2009   Coronary atherosclerosis 12/31/2009   OSTEOARTHRITIS, HAND 12/31/2009   [place] DISEASE, CERVICAL 12/31/2009   [place] DISEASE, LUMBAR 12/31/2009    Past Medical History:  Diagnosis Date   ANXIETY 12/31/2009   Aortic insufficiency    a. mod by echo 2017.   CAD (coronary artery disease)    a. s/p CABGx3 and LAA clipping in 12/2014, NSTEMI 05/2015 s/p DES to native RCA and SVG-dRCA; occluded ramus-SVG was treated medically). c. neg nuc 10/2015 at [facility].   Chronic diastolic CHF (congestive heart failure) (HCC)    Chronic fatigue    CKD (chronic kidney disease), stage II    DEPRESSION 12/31/2009   [place] DISEASE, CERVICAL 12/31/2009   [place] DISEASE, LUMBAR 12/31/2009   DIVERTICULITIS, HX OF 12/31/2009   Essential hypertension    Gait abnormality 03/25/2017   GLUCOSE INTOLERANCE 12/31/2009   Habitual alcohol use     History of stroke    self reports " they say i have had some pin strokes"    Hypercalcemia    Hyperkalemia    Hyperlipidemia 12/31/2009   Hypothyroidism    MENOPAUSE, EARLY 12/31/2009   NSTEMI (non-ST elevated myocardial infarction) ([place]) 05/11/2015   Orthostatic hypotension    OSTEOARTHRITIS, HAND    Pacemaker 01/21/2012   MDT Adapta dual chamber   Paroxysmal atrial flutter ([place])    Persistent atrial fibrillation (HCC)    PVD (peripheral vascular disease), hx stents to bil SFAs 02/2010    Rash, skin     superficial raised red pencil point sized rash bilateral forearms; states " it started when i started the Plavix "    Ruptured left breast implant    Symptomatic sinus bradycardia 01/22/2012   Syncope    a. 11/2015 ? due to medications.   Syncope    reports on 05-10-17 " i passed out 2 months ago  in the bathroom and broke some ribs"     Tachy-brady syndrome ([place])    a. s/p MDT PPM 2013.   Tricuspid regurgitation     Past Surgical History:  Procedure Laterality Date   ABDOMINAL AORTOGRAM W/LOWER EXTREMITY Bilateral 02/15/2017   Procedure: ABDOMINAL AORTOGRAM W/LOWER EXTREMITY;  Surgeon: Lorretta Harp, MD;  Location: [place] [facility];  Service: Cardiovascular;  Laterality: Bilateral;  bilat   ABDOMINAL AORTOGRAM W/LOWER EXTREMITY N/A 10/06/2018   Procedure: ABDOMINAL AORTOGRAM W/LOWER EXTREMITY;  Surgeon: Lorretta Harp, MD;  Location: [place] [facility];  Service: Cardiovascular;  Laterality: N/A;   Ablation of typical atrial flutter (CTI line)  09/05/2016   Dr Antonieta Pert at [place]   Removed 01/2018   BREAST IMPLANT REMOVAL Bilateral 01/14/2018   Procedure: REMOVAL BILATERAL BREAST IMPLANTS;  Surgeon: Irene Limbo, MD;  Location: [place];  Service: Plastics;  Laterality: Bilateral;   CAPSULECTOMY Bilateral 01/14/2018   Procedure: CAPSULECTOMY;  Surgeon: Irene Limbo, MD;  Location: [place];   Service: Plastics;  Laterality: Bilateral;   CARDIAC CATHETERIZATION N/A 01/01/2015   Procedure: Left Heart Cath and Coronary Angiography;  Surgeon: Jettie Booze, MD;  Location: [place] [facility];  Service: Cardiovascular;  Laterality: N/A;   CARDIAC CATHETERIZATION N/A 05/12/2015   Procedure: Left Heart Cath and Coronary Angiography;  Surgeon: Troy Sine, MD;  Location: [place] [facility];  Service: Cardiovascular;  Laterality: N/A;   CARDIAC CATHETERIZATION N/A 05/12/2015   Procedure: Coronary Stent Intervention;  Surgeon: Troy Sine, MD;  Location: [place] [facility];  Service: Cardiovascular;  Laterality: N/A;   CARDIOVERSION N/A 02/01/2015   Procedure: CARDIOVERSION;  Surgeon: Lelon Perla, MD;  Location: [facility] ENDOSCOPY;  Service: Cardiovascular;  Laterality: N/A;   CARDIOVERSION N/A 08/30/2015   Procedure: CARDIOVERSION;  Surgeon: Sanda Klein, MD;  Location: [place] [facility];  Service: Cardiovascular;  Laterality: N/A;   CARPAL TUNNEL RELEASE  2008   "right hand/thumb; carpal tunnel repair; got rid of arthritis" (01/21/2012)   CLIPPING OF ATRIAL APPENDAGE N/A 01/04/2015   Procedure: CLIPPING OF ATRIAL APPENDAGE;  Surgeon: Grace Isaac, MD;  Location: [place];  Service: Open Heart Surgery;  Laterality: N/A;   COLONOSCOPY N/A 12/21/2015   Procedure: COLONOSCOPY;  Surgeon: Milus Banister, MD;  Location: [facility];  Service: Endoscopy;  Laterality: N/A;   CORONARY ARTERY BYPASS GRAFT N/A 01/04/2015   Procedure: CORONARY ARTERY BYPASS GRAFTING (CABG) x 3 using left internal mammory artery and greater saphenous vein right leg harvested endoscopically.;  Surgeon: Grace Isaac, MD;  LIMA-LAD, SVG-RI, SVG-PDA   CORONARY STENT INTERVENTION N/A 11/07/2020   Procedure: CORONARY STENT INTERVENTION;  Surgeon: Early Osmond, MD;  Location: [place] [facility];  Service: Cardiovascular;  Laterality: N/A;   ESOPHAGOGASTRODUODENOSCOPY (EGD) WITH PROPOFOL N/A 12/18/2015   Procedure:  ESOPHAGOGASTRODUODENOSCOPY (EGD) WITH PROPOFOL;  Surgeon: Milus Banister, MD;  Location: [facility];  Service: Endoscopy;  Laterality: N/A;   FACELIFT, LOWER 2/3  1995   "mini" (01/21/2012)   GIVENS CAPSULE STUDY N/A 12/21/2015   Procedure: GIVENS CAPSULE STUDY;  Surgeon: Milus Banister, MD;  Location: [facility];  Service: Endoscopy;  Laterality: N/A;   LEFT HEART CATH AND CORS/GRAFTS ANGIOGRAPHY N/A 11/07/2020   Procedure: LEFT HEART CATH AND CORS/GRAFTS ANGIOGRAPHY;  Surgeon: Early Osmond, MD;  Location: [place] [facility];  Service: Cardiovascular;  Laterality: N/A;   Lower Arterial Examination  10/28/2011   R. SFA stent mild-moderate mixed density plaque with elevated velocities consistent with 50% diameter reduction. L. SFA stent moderate mixed denisty plaque at mid to distal level consistent with 50-69% diameter reduction.   LOWER EXTREMITY ANGIOGRAPHY N/A 02/15/2017   Procedure: LOWER EXTREMITY ANGIOGRAPHY;  Surgeon: Lorretta Harp, MD;  Location: [place] [facility];  Service: Cardiovascular;  Laterality: N/A;   OOPHORECTOMY  ~1979   PARATHYROIDECTOMY N/A 05/13/2017   Procedure: PARATHYROIDECTOMY;  Surgeon: Armandina Gemma, MD;  Location: [facility];  Service: General;  Laterality: N/A;   PARTIAL COLECTOMY  2010   PERIPHERAL ARTERIAL STENT GRAFT  2012; 2012   "LLE; RLE" (01/21/2012)   PERIPHERAL VASCULAR BALLOON ANGIOPLASTY Right 02/15/2017   Procedure: PERIPHERAL VASCULAR BALLOON ANGIOPLASTY;  Surgeon: Lorretta Harp, MD;  Location: [place] [facility];  Service: Cardiovascular;  Laterality: Right;  SFA     PERIPHERAL VASCULAR INTERVENTION Right 10/06/2018   Procedure: PERIPHERAL VASCULAR INTERVENTION;  Surgeon: Lorretta Harp, MD;  Location: [place] [facility];  Service: Cardiovascular;  Laterality: Right;   PERMANENT PACEMAKER INSERTION N/A 01/21/2012   Medtronic Adapta L implanted by Dr Sallyanne Kuster for tachy/brady syndrome   POSTERIOR CERVICAL LAMINECTOMY  1985   TEE WITHOUT  CARDIOVERSION N/A 01/04/2015   Procedure: TRANSESOPHAGEAL ECHOCARDIOGRAM (TEE);  Surgeon: Grace Isaac, MD;  Location: [place];  Service: Open Heart Surgery;  Laterality: N/A;   TEE WITHOUT CARDIOVERSION N/A 02/01/2015   Procedure: TRANSESOPHAGEAL ECHOCARDIOGRAM (TEE);  Surgeon: Lelon Perla, MD;  Location: [facility] ENDOSCOPY;  Service: Cardiovascular;  Laterality: N/A;   TEE WITHOUT CARDIOVERSION N/A 08/30/2015   Procedure: TRANSESOPHAGEAL ECHOCARDIOGRAM (TEE);  Surgeon: Sanda Klein, MD;  Location: [place];  Service: Cardiovascular;  Laterality: N/A;   VAGINAL HYSTERECTOMY  1975    Social History   Socioeconomic History   Marital status: Widowed    Spouse name: Not on file   Number of children: 3   Years of education: Not on file   Highest education level: Not on file  Occupational History   Occupation: Retired Teacher, 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  Retired.   Social Determinants of Health   Financial Resource Strain: Not on file  Food Insecurity: Not on file  Transportation Needs: Not on file  Physical Activity: Not on file  Stress: Not on file  Social Connections: Not on file  Intimate Partner Violence: Not on file    Family History  Problem Relation Age of Onset   Heart disease Mother    Hypertension Mother    Stroke Mother    Stroke Father    Heart disease Father    Heart disease Brother    Hypertension Brother        2 brothers   CVA Brother        2  brothers  Heart attack Brother        2 brothers     Review of Systems  Constitutional: Negative.  Negative for chills and fever.  Respiratory:  Negative for cough.   Gastrointestinal: Negative.  Negative for nausea and vomiting.  Genitourinary: Negative.  Negative for dysuria.  Musculoskeletal:  Positive for back pain and neck pain.  Skin:  Negative for rash.  Neurological:  Negative for dizziness and headaches.  All other systems reviewed and are negative.  Today's Vitals   04/07/21 1313  BP: 122/66  Pulse: 79  Temp: 98.7 F (37.1 C)  TempSrc: Oral  SpO2: 94%  Weight: 146 lb 8 oz (66.5 kg)  Height: 5\' 4"  (1.626 m)   Body mass index is 25.15 kg/m.  Physical Exam Vitals reviewed.  Constitutional:      Appearance: Normal appearance.  HENT:     Head: Normocephalic.  Eyes:     Extraocular Movements: Extraocular movements intact.  Cardiovascular:     Rate and Rhythm: Normal rate.  Pulmonary:     Effort: Pulmonary effort is normal.  Skin:    General: Skin is warm and dry.  Neurological:     General: No focal deficit present.     Mental Status: She is alert and oriented to person, place, and time.  Psychiatric:        Mood and Affect: Mood normal.        Behavior: Behavior normal.     ASSESSMENT & PLAN: A total of 30 minutes was spent with the patient and counseling/coordination of care regarding preparing for this visit, review of most recent office visit notes, review of most recent blood work results, review of most recent orthopedic and neurosurgical office visit notes, review of most recent cardiology office visit notes, treatment and management of chronic neck and back pain, prognosis, documentation and need for follow-up.  Problem List Items Addressed This Visit       Cardiovascular and Mediastinum   Chronic diastolic heart failure ([place])   Coronary artery disease involving coronary bypass graft of native heart without angina pectoris     Other   Long  term current use of anticoagulant   Cardiac pacemaker in situ   Cervical spine disease   Chronic neck pain - Primary    Degenerative disc disease with advanced osteoarthritis. Not a good surgical candidate. We will try physical therapy.      Relevant Orders   Ambulatory referral to Physical Therapy   Chronic bilateral low back pain without sciatica    Advanced osteoarthritis and disc disease of lumbar spine.  Not a surgical candidate. We will try physical therapy. Advised to take Tylenol as needed.  Avoid NSAIDs due to chronic kidney disease.      Relevant Orders   Ambulatory referral to Physical Therapy   Patient Instructions  Health Maintenance After Age 40 After age 30, you are at a higher risk for certain long-term diseases and infections as well as injuries from falls. Falls are a major cause of broken bones and head injuries in people who are older than age 54. Getting regular preventive care can help to keep you healthy and well. Preventive care includes getting regular testing and making lifestyle changes as recommended by your health care provider. Talk with your health care provider about: Which screenings and tests you should have. A screening is a test that checks for a disease when you have no symptoms. A diet and exercise plan that is right for you.  What should I know about screenings and tests to prevent falls? Screening and testing are the best ways to find a health problem early. Early diagnosis and treatment give you the best chance of managing medical conditions that are common after age 33. Certain conditions and lifestyle choices may make you more likely to have a fall. Your health care provider may recommend: Regular vision checks. Poor vision and conditions such as cataracts can make you more likely to have a fall. If you wear glasses, make sure to get your prescription updated if your vision changes. Medicine review. Work with your health care provider to regularly  review all of the medicines you are taking, including over-the-counter medicines. Ask your health care provider about any side effects that may make you more likely to have a fall. Tell your health care provider if any medicines that you take make you feel dizzy or sleepy. Strength and balance checks. Your health care provider may recommend certain tests to check your strength and balance while standing, walking, or changing positions. Foot health exam. Foot pain and numbness, as well as not wearing proper footwear, can make you more likely to have a fall. Screenings, including: Osteoporosis screening. Osteoporosis is a condition that causes the bones to get weaker and break more easily. Blood pressure screening. Blood pressure changes and medicines to control blood pressure can make you feel dizzy. Depression screening. You may be more likely to have a fall if you have a fear of falling, feel depressed, or feel unable to do activities that you used to do. Alcohol use screening. Using too much alcohol can affect your balance and may make you more likely to have a fall. Follow these instructions at home: Lifestyle Do not drink alcohol if: Your health care provider tells you not to drink. If you drink alcohol: Limit how much you have to: 0-1 drink a day for women. 0-2 drinks a day for men. Know how much alcohol is in your drink. In the [place] one drink equals one 12 oz bottle of beer (355 mL), one 5 oz glass of wine (148 mL), or one 1 oz glass of hard liquor (44 mL). Do not use any products that contain nicotine or tobacco. These products include cigarettes, chewing tobacco, and vaping devices, such as e-cigarettes. If you need help quitting, ask your health care provider. Activity  Follow a regular exercise program to stay fit. This will help you maintain your balance. Ask your health care provider what types of exercise are appropriate for you. If you need a cane or walker, use it as recommended  by your health care provider. Wear supportive shoes that have nonskid soles. Safety  Remove any tripping hazards, such as rugs, cords, and clutter. Install safety equipment such as grab bars in bathrooms and safety rails on stairs. Keep rooms and walkways well-lit. General instructions Talk with your health care provider about your risks for falling. Tell your health care provider if: You fall. Be sure to tell your health care provider about all falls, even ones that seem minor. You feel dizzy, tiredness (fatigue), or off-balance. Take over-the-counter and prescription medicines only as told by your health care provider. These include supplements. Eat a healthy diet and maintain a healthy weight. A healthy diet includes low-fat dairy products, low-fat (lean) meats, and fiber from whole grains, beans, and lots of fruits and vegetables. Stay current with your vaccines. Schedule regular health, dental, and eye exams. Summary Having a healthy lifestyle and getting preventive  care can help to protect your health and wellness after age 28. Screening and testing are the best way to find a health problem early and help you avoid having a fall. Early diagnosis and treatment give you the best chance for managing medical conditions that are more common for people who are older than age 47. Falls are a major cause of broken bones and head injuries in people who are older than age 49. Take precautions to prevent a fall at home. Work with your health care provider to learn what changes you can make to improve your health and wellness and to prevent falls. This information is not intended to replace advice given to you by your health care provider. Make sure you discuss any questions you have with your health care provider. Document Revised: 06/17/2020 Document Reviewed: 06/17/2020 Elsevier Patient Education  2022 Elizabeth, MD [facility]

## 2021-04-16 DIAGNOSIS — M5413 Radiculopathy, cervicothoracic region: Secondary | ICD-10-CM | POA: Diagnosis not present

## 2021-04-16 DIAGNOSIS — M546 Pain in thoracic spine: Secondary | ICD-10-CM | POA: Diagnosis not present

## 2021-04-16 DIAGNOSIS — M542 Cervicalgia: Secondary | ICD-10-CM | POA: Diagnosis not present

## 2021-04-22 DIAGNOSIS — H2511 Age-related nuclear cataract, right eye: Secondary | ICD-10-CM | POA: Diagnosis not present

## 2021-04-22 DIAGNOSIS — H26492 Other secondary cataract, left eye: Secondary | ICD-10-CM | POA: Diagnosis not present

## 2021-04-22 DIAGNOSIS — Z961 Presence of intraocular lens: Secondary | ICD-10-CM | POA: Diagnosis not present

## 2021-04-22 DIAGNOSIS — H35373 Puckering of macula, bilateral: Secondary | ICD-10-CM | POA: Diagnosis not present

## 2021-04-22 DIAGNOSIS — H348312 Tributary (branch) retinal vein occlusion, right eye, stable: Secondary | ICD-10-CM | POA: Diagnosis not present

## 2021-04-22 DIAGNOSIS — H16142 Punctate keratitis, left eye: Secondary | ICD-10-CM | POA: Diagnosis not present

## 2021-04-22 DIAGNOSIS — H04123 Dry eye syndrome of bilateral lacrimal glands: Secondary | ICD-10-CM | POA: Diagnosis not present

## 2021-04-23 DIAGNOSIS — M5413 Radiculopathy, cervicothoracic region: Secondary | ICD-10-CM | POA: Diagnosis not present

## 2021-04-23 DIAGNOSIS — M546 Pain in thoracic spine: Secondary | ICD-10-CM | POA: Diagnosis not present

## 2021-04-23 DIAGNOSIS — M542 Cervicalgia: Secondary | ICD-10-CM | POA: Diagnosis not present

## 2021-04-28 DIAGNOSIS — M546 Pain in thoracic spine: Secondary | ICD-10-CM | POA: Diagnosis not present

## 2021-04-28 DIAGNOSIS — M542 Cervicalgia: Secondary | ICD-10-CM | POA: Diagnosis not present

## 2021-04-28 DIAGNOSIS — M5413 Radiculopathy, cervicothoracic region: Secondary | ICD-10-CM | POA: Diagnosis not present

## 2021-05-01 DIAGNOSIS — M546 Pain in thoracic spine: Secondary | ICD-10-CM | POA: Diagnosis not present

## 2021-05-01 DIAGNOSIS — M5413 Radiculopathy, cervicothoracic region: Secondary | ICD-10-CM | POA: Diagnosis not present

## 2021-05-01 DIAGNOSIS — M542 Cervicalgia: Secondary | ICD-10-CM | POA: Diagnosis not present

## 2021-05-06 DIAGNOSIS — M546 Pain in thoracic spine: Secondary | ICD-10-CM | POA: Diagnosis not present

## 2021-05-06 DIAGNOSIS — M5413 Radiculopathy, cervicothoracic region: Secondary | ICD-10-CM | POA: Diagnosis not present

## 2021-05-06 DIAGNOSIS — M542 Cervicalgia: Secondary | ICD-10-CM | POA: Diagnosis not present

## 2021-05-08 DIAGNOSIS — M546 Pain in thoracic spine: Secondary | ICD-10-CM | POA: Diagnosis not present

## 2021-05-08 DIAGNOSIS — M542 Cervicalgia: Secondary | ICD-10-CM | POA: Diagnosis not present

## 2021-05-08 DIAGNOSIS — M5413 Radiculopathy, cervicothoracic region: Secondary | ICD-10-CM | POA: Diagnosis not present

## 2021-05-09 ENCOUNTER — Ambulatory Visit (HOSPITAL_COMMUNITY)
Admission: RE | Admit: 2021-05-09 | Discharge: 2021-05-09 | Disposition: A | Discharge status: Home / Self Care | Payer: Medicare HMO | Source: Ambulatory Visit | Attending: Cardiovascular Disease | Admitting: Cardiovascular Disease

## 2021-05-09 DIAGNOSIS — I739 Peripheral vascular disease, unspecified: Secondary | ICD-10-CM | POA: Insufficient documentation

## 2021-05-09 DIAGNOSIS — Z9862 Peripheral vascular angioplasty status: Secondary | ICD-10-CM | POA: Diagnosis not present

## 2021-05-12 ENCOUNTER — Emergency Department (HOSPITAL_BASED_OUTPATIENT_CLINIC_OR_DEPARTMENT_OTHER): Payer: Medicare HMO | Admitting: Radiology

## 2021-05-12 ENCOUNTER — Other Ambulatory Visit: Payer: Self-pay

## 2021-05-12 ENCOUNTER — Other Ambulatory Visit (HOSPITAL_BASED_OUTPATIENT_CLINIC_OR_DEPARTMENT_OTHER): Payer: Self-pay

## 2021-05-12 ENCOUNTER — Emergency Department (HOSPITAL_BASED_OUTPATIENT_CLINIC_OR_DEPARTMENT_OTHER)
Admission: EM | Admit: 2021-05-12 | Discharge: 2021-05-12 | Disposition: A | Discharge status: Home / Self Care | Payer: Medicare HMO | Attending: Emergency Medicine | Admitting: Emergency Medicine

## 2021-05-12 ENCOUNTER — Encounter (HOSPITAL_BASED_OUTPATIENT_CLINIC_OR_DEPARTMENT_OTHER): Payer: Self-pay | Admitting: Emergency Medicine

## 2021-05-12 DIAGNOSIS — S22080A Wedge compression fracture of T11-T12 vertebra, initial encounter for closed fracture: Secondary | ICD-10-CM

## 2021-05-12 DIAGNOSIS — Y93K1 Activity, walking an animal: Secondary | ICD-10-CM | POA: Insufficient documentation

## 2021-05-12 DIAGNOSIS — S3992XA Unspecified injury of lower back, initial encounter: Secondary | ICD-10-CM | POA: Diagnosis not present

## 2021-05-12 DIAGNOSIS — M545 Low back pain, unspecified: Secondary | ICD-10-CM | POA: Diagnosis not present

## 2021-05-12 DIAGNOSIS — W19XXXA Unspecified fall, initial encounter: Secondary | ICD-10-CM | POA: Insufficient documentation

## 2021-05-12 DIAGNOSIS — Z7982 Long term (current) use of aspirin: Secondary | ICD-10-CM | POA: Insufficient documentation

## 2021-05-12 DIAGNOSIS — M546 Pain in thoracic spine: Secondary | ICD-10-CM | POA: Diagnosis not present

## 2021-05-12 MED ORDER — METHOCARBAMOL 500 MG PO TABS
500.0000 mg | ORAL_TABLET | Freq: Two times a day (BID) | ORAL | 0 refills | Status: DC
  Filled 2021-05-12: qty 20, 10d supply, fill #0

## 2021-05-12 MED ORDER — OXYCODONE-ACETAMINOPHEN 5-325 MG PO TABS
1.0000 | ORAL_TABLET | Freq: Four times a day (QID) | ORAL | 0 refills | Status: DC | PRN
  Filled 2021-05-12: qty 15, 3d supply, fill #0

## 2021-05-12 NOTE — ED Provider Notes (Signed)
?[facility] EMERGENCY DEPT ?Provider Note ? ? ?CSN: [id number] ?Arrival date & time: 05/12/21  1256 ? ?  ? ?History ? ?Chief Complaint  ?Patient presents with  ? Fall  ?  Blood thinner  ? ? ?Khalis Hittle Rummell is a 81 y.o. female. ? ?81 year old female here after mechanical fall just prior to arrival.  Patient is on Plavix and states that she was walking her dog and had lost her balance.  Did not strike her head did not injure her neck.  Fell onto her mid and lower back.  Denies any neurological features over the lower extremities.  No numbness or tingling.  No chest or abdominal discomfort.  Has not had a headache or any emesis since the accident. ? ? ?  ? ?Home Medications ?Prior to Admission medications   ?Medication Sig Start Date End Date Taking? Authorizing Provider  ?amLODipine (NORVASC) 5 MG tablet Take 7.5 mg by mouth daily.    [provider]  ?ascorbic acid (VITAMIN C) 250 MG CHEW Chew 750 mg by mouth daily.    [provider]  ?aspirin EC 81 MG tablet Take 81 mg by mouth daily.     [provider]  ?Biotin 10000 MCG TABS Take 10,000 mcg by mouth daily.    [provider]  ?carvedilol (COREG) 6.25 MG tablet TAKE 1 TABLET BY MOUTH 2 TIMES DAILY WITH A MEAL. 11/07/20   Croitoru, Mihai, MD  ?Cholecalciferol (VITAMIN D) 50 MCG (2000 UT) tablet Take 2,000 Units by mouth daily.    [provider]  ?citalopram (CELEXA) 40 MG tablet TAKE 1 TABLET BY MOUTH EVERY DAY 03/10/21   Horald Pollen, MD  ?clopidogrel (PLAVIX) 75 MG tablet TAKE 1 TABLET BY MOUTH DAILY WITH BREAKFAST. 02/06/21   Croitoru, Mihai, MD  ?Cyanocobalamin (B-12) 5000 MCG CAPS Take 10,000 mcg by mouth daily.    [provider]  ?dofetilide (TIKOSYN) 125 MCG capsule TAKE 1 CAPSULE BY MOUTH EVERY 12 HOURS. ([place]) 12/18/20   Croitoru, Mihai, MD  ?furosemide (LASIX) 40 MG tablet TAKE 1 TABLET BY MOUTH EVERY OTHER DAY ?Patient taking differently: 20 mg as needed (as  needed for edema). 11/13/20   Croitoru, Mihai, MD  ?furosemide (LASIX) 80 MG tablet Take 1 tablet (80 mg total) by mouth daily. 10/23/20   Croitoru, Mihai, MD  ?levothyroxine (SYNTHROID, LEVOTHROID) 25 MCG tablet Take 25 mcg by mouth daily before breakfast.  04/03/16   [provider]  ?loteprednol (LOTEMAX) 0.5 % ophthalmic suspension Place 2 drops into both eyes See admin instructions. Instill 2 drops into both eyes 2 times a day twice weekly    [provider]  ?Magnesium 200 MG TABS Take 200 mg by mouth daily.    [provider]  ?Multiple Vitamin (MULTIVITAMIN WITH MINERALS) TABS tablet Take 1 tablet by mouth 2 (two) times daily.    [provider]  ?Multiple Vitamins-Minerals (ZINC PO) Take 150 mg by mouth daily.    [provider]  ?nitroGLYCERIN (NITROSTAT) 0.4 MG SL tablet Place 1 tablet (0.4 mg total) under the tongue every 5 (five) minutes x 3 doses as needed for chest pain. 05/15/15   Lyda Jester M, PA-C  ?rosuvastatin (CRESTOR) 40 MG tablet Take 1 tablet (40 mg total) by mouth daily. 11/07/20 11/02/21  Cheryln Manly, NP  ?VITAMIN D-VITAMIN K PO Take 1 capsule by mouth daily.    [provider]  ?   ? ?Allergies    ?Brilinta [ticagrelor],  Atorvastatin, Clonidine derivatives, Crestor [rosuvastatin calcium], Exforge [amlodipine besylate-valsartan], and Spironolactone   ? ?Review of Systems   ?Review of Systems  ?All other systems reviewed and are negative. ? ?Physical Exam ?Updated Vital Signs ?BP (!) 168/86   Pulse 84   Temp 97.8 ?F (36.6 ?C) (Oral)   Resp 16   Ht 1.626 m ('5\' 4"'$ )   Wt 66.7 kg   SpO2 98%   BMI 25.23 kg/m?  ?Physical Exam ?Vitals and nursing note reviewed.  ?Constitutional:   ?   General: She is not in acute distress. ?   Appearance: Normal appearance. She is well-developed. She is not toxic-appearing.  ?HENT:  ?   Head: Normocephalic and atraumatic.  ?Eyes:  ?   General: Lids are normal.  ?   Conjunctiva/sclera: Conjunctivae  normal.  ?   Pupils: Pupils are equal, round, and reactive to light.  ?Neck:  ?   Thyroid: No thyroid mass.  ?   Trachea: No tracheal deviation.  ?Cardiovascular:  ?   Rate and Rhythm: Normal rate and regular rhythm.  ?   Heart sounds: Normal heart sounds. No murmur heard. ?  No gallop.  ?Pulmonary:  ?   Effort: Pulmonary effort is normal. No respiratory distress.  ?   Breath sounds: Normal breath sounds. No stridor. No decreased breath sounds, wheezing, rhonchi or rales.  ?Abdominal:  ?   General: There is no distension.  ?   Palpations: Abdomen is soft.  ?   Tenderness: There is no abdominal tenderness. There is no rebound.  ?Musculoskeletal:     ?   General: No tenderness. Normal range of motion.  ?   Cervical back: Normal range of motion and neck supple.  ?     Back: ? ?Skin: ?   General: Skin is warm and dry.  ?   Findings: No abrasion or rash.  ?Neurological:  ?   General: No focal deficit present.  ?   Mental Status: She is alert and oriented to person, place, and time. Mental status is at baseline.  ?   GCS: GCS eye subscore is 4. GCS verbal subscore is 5. GCS motor subscore is 6.  ?   Cranial Nerves: No cranial nerve deficit.  ?   Sensory: No sensory deficit.  ?   Motor: Motor function is intact.  ?Psychiatric:     ?   Attention and Perception: Attention normal.     ?   Speech: Speech normal.     ?   Behavior: Behavior normal.  ? ? ?ED Results / Procedures / Treatments   ?Labs ?(all labs ordered are listed, but only abnormal results are displayed) ?Labs Reviewed - No data to display ? ?EKG ?None ? ?Radiology ?No results found. ? ?Procedures ?Procedures  ? ? ?Medications Ordered in ED ?Medications - No data to display ? ?ED Course/ Medical Decision Making/ A&P ?  ?                        ?Medical Decision Making ?Amount and/or Complexity of Data Reviewed ?Radiology: ordered. ? ? ?Patient presented here after mechanical fall just prior to arrival.  Patient is on Plavix but she did not strike her head.  Do not  feel that she needs to have a head CT at this time.  Patient's lumbar and thoracic films reviewed and interpreted by me and show that she does have a possible new subtle T12 anterior wedge vertebral compression fracture.  She has no neurological findings at this time.  Plan will be to give her prescription for muscle relaxants as well as pain medication and follow-up with her doctor ? ? ? ? ? ? ? ?Final Clinical Impression(s) / ED Diagnoses ?Final diagnoses:  ?None  ? ? ?Rx / DC Orders ?ED Discharge Orders   ? ? None  ? ?  ? ? ?  ?Lacretia Leigh, MD ?05/12/21 1445 ? ?

## 2021-05-12 NOTE — ED Triage Notes (Signed)
Pt to ED via POV c/o fall . Mechanical fall, tripped while dealing with dog this morning around 8 am.Pt did not hit head. No LOC.  Small abrasion to right forearm. Pt apparently fell on back, pt takes blood thinners- Plavix ?

## 2021-05-12 NOTE — ED Notes (Signed)
Patient verbalizes understanding of discharge instructions. Opportunity for questioning and answers were provided. Patient discharged from ED.  °

## 2021-05-13 ENCOUNTER — Ambulatory Visit (INDEPENDENT_AMBULATORY_CARE_PROVIDER_SITE_OTHER): Payer: Medicare HMO

## 2021-05-13 DIAGNOSIS — I495 Sick sinus syndrome: Secondary | ICD-10-CM

## 2021-05-15 LAB — CUP PACEART REMOTE DEVICE CHECK
Battery Impedance: 1438 Ohm
Battery Remaining Longevity: 43 mo
Battery Voltage: 2.76 V
Brady Statistic AP VP Percent: 0 %
Brady Statistic AP VS Percent: 100 %
Brady Statistic AS VP Percent: 0 %
Brady Statistic AS VS Percent: 0 %
Date Time Interrogation Session: 20230405204631
Implantable Lead Implant Date: 20131212
Implantable Lead Implant Date: 20131212
Implantable Lead Location: 753859
Implantable Lead Location: 753860
Implantable Lead Model: 5076
Implantable Lead Model: 5076
Implantable Pulse Generator Implant Date: 20131212
Lead Channel Impedance Value: 403 Ohm
Lead Channel Impedance Value: 420 Ohm
Lead Channel Pacing Threshold Amplitude: 0.75 V
Lead Channel Pacing Threshold Amplitude: 0.875 V
Lead Channel Pacing Threshold Pulse Width: 0.4 ms
Lead Channel Pacing Threshold Pulse Width: 0.4 ms
Lead Channel Setting Pacing Amplitude: 2 V
Lead Channel Setting Pacing Amplitude: 2.5 V
Lead Channel Setting Pacing Pulse Width: 0.4 ms
Lead Channel Setting Sensing Sensitivity: 4 mV

## 2021-05-19 ENCOUNTER — Telehealth: Payer: Self-pay | Admitting: Emergency Medicine

## 2021-05-19 NOTE — Telephone Encounter (Signed)
Fallen and fractured vertebrae in back.  ? ?While in ER, they advised she follow up with MD. Pt states she is developing pain in another place, and may need more Xrays. Appt scheduled for pt.  ?

## 2021-05-20 ENCOUNTER — Telehealth: Payer: Self-pay

## 2021-05-20 ENCOUNTER — Other Ambulatory Visit: Payer: Self-pay

## 2021-05-20 DIAGNOSIS — E039 Hypothyroidism, unspecified: Secondary | ICD-10-CM

## 2021-05-20 MED ORDER — LEVOTHYROXINE SODIUM 25 MCG PO TABS: 25.0000 ug | ORAL_TABLET | Freq: Every day | ORAL | 3 refills | Status: DC

## 2021-05-20 NOTE — Telephone Encounter (Signed)
Pt was advised that she is no longer a pt of her Endocrinologist bc she hasn't been seen in a year.  ? ?Pt was advised that the med levothyroxine (SYNTHROID, LEVOTHROID) 25 MCG tablet and the blood work is to be monitored by her PCP now. ? ?Pt needs a refill on the levothyroxine (SYNTHROID, LEVOTHROID) 25 MCG tablet  ? ?Pharmacy: ?[id number] [place], [place]? ?LOV 04/07/21 ?ROV 05/28/21 ?

## 2021-05-20 NOTE — Telephone Encounter (Signed)
Called patient to inform her that and x ray is normally ordered after the visit. Offered patient a sooner appt with another provider. Patient declined. Will keep her appt next week  ?

## 2021-05-20 NOTE — Telephone Encounter (Signed)
Rx sent 

## 2021-05-20 NOTE — Telephone Encounter (Signed)
Pt is requesting xray. Pt is having a lots of pain in hips. Pt is scheduled to come in on 05/28/21. ? ? ?

## 2021-05-20 NOTE — Telephone Encounter (Signed)
Agree. Thanks

## 2021-05-20 NOTE — Telephone Encounter (Signed)
OK to fill.  Thanks.

## 2021-05-27 ENCOUNTER — Ambulatory Visit: Payer: Medicare HMO | Admitting: Emergency Medicine

## 2021-05-27 NOTE — Progress Notes (Signed)
Remote pacemaker transmission.   

## 2021-05-28 ENCOUNTER — Encounter: Payer: Self-pay | Admitting: Emergency Medicine

## 2021-05-28 ENCOUNTER — Ambulatory Visit (INDEPENDENT_AMBULATORY_CARE_PROVIDER_SITE_OTHER): Payer: Medicare HMO | Admitting: Emergency Medicine

## 2021-05-28 VITALS — BP 136/68 | HR 80 | Temp 98.5°F | Ht 64.0 in | Wt 144.4 lb

## 2021-05-28 DIAGNOSIS — Z7901 Long term (current) use of anticoagulants: Secondary | ICD-10-CM | POA: Diagnosis not present

## 2021-05-28 DIAGNOSIS — G8929 Other chronic pain: Secondary | ICD-10-CM

## 2021-05-28 DIAGNOSIS — S22080D Wedge compression fracture of T11-T12 vertebra, subsequent encounter for fracture with routine healing: Secondary | ICD-10-CM

## 2021-05-28 DIAGNOSIS — I700 Atherosclerosis of aorta: Secondary | ICD-10-CM

## 2021-05-28 DIAGNOSIS — E039 Hypothyroidism, unspecified: Secondary | ICD-10-CM

## 2021-05-28 DIAGNOSIS — M545 Low back pain, unspecified: Secondary | ICD-10-CM | POA: Diagnosis not present

## 2021-05-28 DIAGNOSIS — I5032 Chronic diastolic (congestive) heart failure: Secondary | ICD-10-CM | POA: Diagnosis not present

## 2021-05-28 DIAGNOSIS — M542 Cervicalgia: Secondary | ICD-10-CM | POA: Diagnosis not present

## 2021-05-28 DIAGNOSIS — E785 Hyperlipidemia, unspecified: Secondary | ICD-10-CM

## 2021-05-28 DIAGNOSIS — S22080A Wedge compression fracture of T11-T12 vertebra, initial encounter for closed fracture: Secondary | ICD-10-CM | POA: Insufficient documentation

## 2021-05-28 LAB — CBC WITH DIFFERENTIAL/PLATELET
Basophils Absolute: 0.1 10*3/uL (ref 0.0–0.1)
Basophils Relative: 0.7 % (ref 0.0–3.0)
Eosinophils Absolute: 0.2 10*3/uL (ref 0.0–0.7)
Eosinophils Relative: 2.7 % (ref 0.0–5.0)
HCT: 43.8 % (ref 36.0–46.0)
Hemoglobin: 14.9 g/dL (ref 12.0–15.0)
Lymphocytes Relative: 26.3 % (ref 12.0–46.0)
Lymphs Abs: 2.3 10*3/uL (ref 0.7–4.0)
MCHC: 34.1 g/dL (ref 30.0–36.0)
MCV: 88.1 fl (ref 78.0–100.0)
Monocytes Absolute: 0.6 10*3/uL (ref 0.1–1.0)
Monocytes Relative: 6.7 % (ref 3.0–12.0)
Neutro Abs: 5.5 10*3/uL (ref 1.4–7.7)
Neutrophils Relative %: 63.6 % (ref 43.0–77.0)
Platelets: 342 10*3/uL (ref 150.0–400.0)
RBC: 4.97 Mil/uL (ref 3.87–5.11)
RDW: 13.1 % (ref 11.5–15.5)
WBC: 8.6 10*3/uL (ref 4.0–10.5)

## 2021-05-28 LAB — LIPID PANEL
Cholesterol: 137 mg/dL (ref 0–200)
HDL: 55.3 mg/dL (ref >39.00)
NonHDL: 81.99
Total CHOL/HDL Ratio: 2
Triglycerides: 239 mg/dL — ABNORMAL HIGH (ref 0.0–149.0)
VLDL: 47.8 mg/dL — ABNORMAL HIGH (ref 0.0–40.0)

## 2021-05-28 LAB — COMPREHENSIVE METABOLIC PANEL
ALT: 24 U/L (ref 0–35)
AST: 29 U/L (ref 0–37)
Albumin: 4.3 g/dL (ref 3.5–5.2)
Alkaline Phosphatase: 142 U/L — ABNORMAL HIGH (ref 39–117)
BUN: 27 mg/dL — ABNORMAL HIGH (ref 6–23)
CO2: 27 mEq/L (ref 19–32)
Calcium: 9.5 mg/dL (ref 8.4–10.5)
Chloride: 99 mEq/L (ref 96–112)
Creatinine, Ser: 1.21 mg/dL — ABNORMAL HIGH (ref 0.40–1.20)
GFR: 42.1 mL/min — ABNORMAL LOW (ref >60.00)
Glucose, Bld: 113 mg/dL — ABNORMAL HIGH (ref 70–99)
Potassium: 5.2 mEq/L — ABNORMAL HIGH (ref 3.5–5.1)
Sodium: 136 mEq/L (ref 135–145)
Total Bilirubin: 0.4 mg/dL (ref 0.2–1.2)
Total Protein: 7.4 g/dL (ref 6.0–8.3)

## 2021-05-28 LAB — HEMOGLOBIN A1C: Hgb A1c MFr Bld: 6.2 % (ref 4.6–6.5)

## 2021-05-28 LAB — TSH: TSH: 1.06 u[IU]/mL (ref 0.35–5.50)

## 2021-05-28 LAB — LDL CHOLESTEROL, DIRECT: Direct LDL: 68 mg/dL

## 2021-05-28 MED ORDER — METHOCARBAMOL 500 MG PO TABS: 500.0000 mg | ORAL_TABLET | Freq: Two times a day (BID) | ORAL | 1 refills | Status: DC

## 2021-05-28 NOTE — Assessment & Plan Note (Signed)
Stable.  Continue rosuvastatin 40 mg daily. 

## 2021-05-28 NOTE — Progress Notes (Signed)
[name] ?81 y.o. ? ? ?Chief Complaint  ?Patient presents with  ? Follow-up  ?  F/u fall.  ?  ? Pain  ?  Pain in hip and lower back after fall. Fall 05/12/2021  ? ? ?HISTORY OF PRESENT ILLNESS: ?This is a 81 y.o. female here for follow-up of fall sustained on 05/12/2021 when she was seen in the emergency department and found to have small compression T12 fracture.  Mechanical fall.  No syncope. ?Much improved and tolerating pain very well.  No bladder or bowel symptoms. ?No other associated symptoms. ?Also has history of hypothyroidism on Synthroid 25 mcg daily.  Needs TSH test. ?No other complaints or medical concerns today. ? ?HPI ? ? ?Prior to Admission medications   ?Medication Sig Start Date End Date Taking? Authorizing Provider  ?amLODipine (NORVASC) 5 MG tablet Take 7.5 mg by mouth daily.   Yes [provider]  ?ascorbic acid (VITAMIN C) 250 MG CHEW Chew 750 mg by mouth daily.   Yes [provider]  ?aspirin EC 81 MG tablet Take 81 mg by mouth daily.    Yes [provider]  ?Biotin 10000 MCG TABS Take 10,000 mcg by mouth daily.   Yes [provider]  ?carvedilol (COREG) 6.25 MG tablet TAKE 1 TABLET BY MOUTH 2 TIMES DAILY WITH A MEAL. 11/07/20  Yes Croitoru, Mihai, MD  ?Cholecalciferol (VITAMIN D) 50 MCG (2000 UT) tablet Take 2,000 Units by mouth daily.   Yes [provider]  ?citalopram (CELEXA) 40 MG tablet TAKE 1 TABLET BY MOUTH EVERY DAY 03/10/21  Yes Jemiah Cuadra, Ines Bloomer, MD  ?clopidogrel (PLAVIX) 75 MG tablet TAKE 1 TABLET BY MOUTH DAILY WITH BREAKFAST. 02/06/21  Yes Croitoru, Mihai, MD  ?Cyanocobalamin (B-12) 5000 MCG CAPS Take 10,000 mcg by mouth daily.   Yes [provider]  ?dofetilide (TIKOSYN) 125 MCG capsule TAKE 1 CAPSULE BY MOUTH EVERY 12 HOURS. ([place]) 12/18/20  Yes Croitoru, Mihai, MD  ?furosemide (LASIX) 40 MG tablet TAKE 1 TABLET BY MOUTH EVERY OTHER DAY ?Patient taking differently: 20 mg as needed (as needed for edema).  11/13/20  Yes Croitoru, Mihai, MD  ?furosemide (LASIX) 80 MG tablet Take 1 tablet (80 mg total) by mouth daily. 10/23/20  Yes Croitoru, Mihai, MD  ?levothyroxine (SYNTHROID) 25 MCG tablet Take 1 tablet (25 mcg total) by mouth daily before breakfast. 05/20/21  Yes Billye Pickerel, Ines Bloomer, MD  ?loteprednol (LOTEMAX) 0.5 % ophthalmic suspension Place 2 drops into both eyes See admin instructions. Instill 2 drops into both eyes 2 times a day twice weekly   Yes [provider]  ?Magnesium 200 MG TABS Take 200 mg by mouth daily.   Yes [provider]  ?methocarbamol (ROBAXIN) 500 MG tablet Take 1 tablet (500 mg total) by mouth 2 (two) times daily. 05/12/21  Yes Lacretia Leigh, MD  ?Multiple Vitamin (MULTIVITAMIN WITH MINERALS) TABS tablet Take 1 tablet by mouth 2 (two) times daily.   Yes [provider]  ?Multiple Vitamins-Minerals (ZINC PO) Take 150 mg by mouth daily.   Yes [provider]  ?nitroGLYCERIN (NITROSTAT) 0.4 MG SL tablet Place 1 tablet (0.4 mg total) under the tongue every 5 (five) minutes x 3 doses as needed for chest pain. 05/15/15  Yes Lyda Jester M, PA-C  ?oxyCODONE-acetaminophen (PERCOCET/ROXICET) 5-325 MG tablet Take 1-2 tablets by mouth every 6 (six) hours as needed for severe pain. 05/12/21  Yes Lacretia Leigh, MD  ?rosuvastatin (CRESTOR) 40 MG tablet Take 1 tablet (  40 mg total) by mouth daily. 11/07/20 11/02/21 Yes Cheryln Manly, NP  ?VITAMIN D-VITAMIN K PO Take 1 capsule by mouth daily.   Yes [provider]  ? ? ?Allergies  ?Allergen Reactions  ? Brilinta [Ticagrelor] Shortness Of Breath  ? Atorvastatin Other (See Comments)  ?  Possible cause of fatigue/malaise  ? Clonidine Derivatives Other (See Comments)  ?  Lowers heart rate  ? Crestor [Rosuvastatin Calcium] Other (See Comments)  ?  Joint/muscle aches  ? Exforge [Amlodipine Besylate-Valsartan] Itching and Rash  ? Spironolactone Other (See Comments)  ?  Contraindicated with history of hyperkalemia   ? ? ?Patient Active Problem List  ? Diagnosis Date Noted  ? Chronic neck pain 04/07/2021  ? Chronic bilateral low back pain without sciatica 04/07/2021  ? Exertional angina (HCC)   ? Renal artery stenosis ([place]) 06/26/2020  ? Cervical spine disease 05/30/2020  ? Statin intolerance 11/22/2019  ? Aortic atherosclerosis ([place]) 11/22/2019  ? Hypothyroidism (acquired) 02/08/2018  ? Coronary artery disease involving coronary bypass graft of native heart without angina pectoris 10/22/2017  ? Dyslipidemia 10/22/2017  ? Cerebrovascular small vessel disease 04/27/2017  ? Cerebrovascular disease 03/25/2017  ? Claudication in peripheral vascular disease ([place]) 02/15/2017  ? Hyperparathyroidism ([place]) 05/13/2016  ? Cardiac pacemaker in situ 01/10/2016  ? Benign neoplasm of ascending colon   ? Long term current use of anticoagulant   ? CKD (chronic kidney disease), stage II   ? Chronic diastolic heart failure ([place]) 10/19/2015  ? Atypical atrial flutter ([place])   ? Paroxysmal atrial flutter ([place]) 08/23/2015  ? Chronic fatigue 06/27/2015  ? Stented coronary artery   ? S/P CABG x 3 01/04/15 01/10/2015  ? CAD (coronary artery disease) 01/04/2015  ? PAF (paroxysmal atrial fibrillation) ([place]) 01/22/2012  ? Sinus node dysfunction ([place]) 01/22/2012  ? S/P placement of cardiac pacemaker, medtronic adapta 01/21/12 01/22/2012  ? PVD (peripheral vascular disease), hx stents to bil SFAs 02/2010 01/22/2012  ? Hyperlipidemia 12/31/2009  ? Anxiety 12/31/2009  ? DEPRESSION 12/31/2009  ? Essential hypertension 12/31/2009  ? Coronary atherosclerosis 12/31/2009  ? OSTEOARTHRITIS, HAND 12/31/2009  ? [place] DISEASE, CERVICAL 12/31/2009  ? [place] DISEASE, LUMBAR 12/31/2009  ? ? ?Past Medical History:  ?Diagnosis Date  ? ANXIETY 12/31/2009  ? Aortic insufficiency   ? a. mod by echo 2017.  ? CAD (coronary artery disease)   ? a. s/p CABGx3 and LAA clipping in 12/2014, NSTEMI 05/2015 s/p DES to native RCA and SVG-dRCA; occluded ramus-SVG was treated medically). c. neg  [facility]uc 10/2015 at [facility]  ? Chronic diastolic CHF (congestive heart failure) ([place])   ? Chronic fatigue   ? CKD (chronic kidney disease), stage II   ? DEPRESSION 12/31/2009  ? [place] DISEASE, CERVICAL 12/31/2009  ? [place] DISEASE, LUMBAR 12/31/2009  ? DIVERTICULITIS, HX OF 12/31/2009  ? Essential hypertension   ? Gait abnormality 03/25/2017  ? GLUCOSE INTOLERANCE 12/31/2009  ? Habitual alcohol use   ? History of stroke   ? self reports " they say i have had some pin strokes"   ? Hypercalcemia   ? Hyperkalemia   ? Hyperlipidemia 12/31/2009  ? Hypothyroidism   ? MENOPAUSE, EARLY 12/31/2009  ? NSTEMI (non-ST elevated myocardial infarction) ([place]) 05/11/2015  ? Orthostatic hypotension   ? OSTEOARTHRITIS, HAND   ? Pacemaker 01/21/2012  ? MDT Adapta dual chamber  ? Paroxysmal atrial flutter ([place])   ? Persistent atrial fibrillation ([place])   ? PVD (peripheral vascular disease), hx stents to bil SFAs 02/2010   ?  Rash, skin   ?  superficial raised red pencil point sized rash bilateral forearms; states " it started when i started the Plavix "   ? Ruptured left breast implant   ? Symptomatic sinus bradycardia 01/22/2012  ? Syncope   ? a. 11/2015 ? due to medications.  ? Syncope   ? reports on 05-10-17 " i passed out 2 months ago in the bathroom and broke some ribs"    ? Tachy-brady syndrome ([place])   ? a. s/p MDT PPM 2013.  ? Tricuspid regurgitation   ? ? ?Past Surgical History:  ?Procedure Laterality Date  ? ABDOMINAL AORTOGRAM W/LOWER EXTREMITY Bilateral 02/15/2017  ? Procedure: ABDOMINAL AORTOGRAM W/LOWER EXTREMITY;  Surgeon: Lorretta Harp, MD;  Location: [facility];  Service: Cardiovascular;  Laterality: Bilateral;  bilat  ? ABDOMINAL AORTOGRAM W/LOWER EXTREMITY N/A 10/06/2018  ? Procedure: ABDOMINAL AORTOGRAM W/LOWER EXTREMITY;  Surgeon: Lorretta Harp, MD;  Location: [facility];  Service: Cardiovascular;  Laterality: N/A;  ? Ablation of typical atrial flutter (CTI line)  09/05/2016  ? Dr Antonieta Pert at [facility]   ?  [place]  ? Removed 01/2018  ? BREAST IMPLANT REMOVAL Bilateral 01/14/2018  ? Procedure: REMOVAL BILATERAL BREAST IMPLANTS;  Surgeon: Irene Limbo, MD;  Location: [facility]

## 2021-05-28 NOTE — Assessment & Plan Note (Signed)
Advised to avoid NSAIDs for pain management. ?

## 2021-05-28 NOTE — Assessment & Plan Note (Signed)
Clinically euthyroid.  TSH done today.  Continue Synthroid 25 mcg daily. ?

## 2021-05-28 NOTE — Patient Instructions (Signed)
Health Maintenance After Age 81 After age 81, you are at a higher risk for certain long-term diseases and infections as well as injuries from falls. Falls are a major cause of broken bones and head injuries in people who are older than age 81. Getting regular preventive care can help to keep you healthy and well. Preventive care includes getting regular testing and making lifestyle changes as recommended by your health care provider. Talk with your health care provider about: Which screenings and tests you should have. A screening is a test that checks for a disease when you have no symptoms. A diet and exercise plan that is right for you. What should I know about screenings and tests to prevent falls? Screening and testing are the best ways to find a health problem early. Early diagnosis and treatment give you the best chance of managing medical conditions that are common after age 81. Certain conditions and lifestyle choices may make you more likely to have a fall. Your health care provider may recommend: Regular vision checks. Poor vision and conditions such as cataracts can make you more likely to have a fall. If you wear glasses, make sure to get your prescription updated if your vision changes. Medicine review. Work with your health care provider to regularly review all of the medicines you are taking, including over-the-counter medicines. Ask your health care provider about any side effects that may make you more likely to have a fall. Tell your health care provider if any medicines that you take make you feel dizzy or sleepy. Strength and balance checks. Your health care provider may recommend certain tests to check your strength and balance while standing, walking, or changing positions. Foot health exam. Foot pain and numbness, as well as not wearing proper footwear, can make you more likely to have a fall. Screenings, including: Osteoporosis screening. Osteoporosis is a condition that causes  the bones to get weaker and break more easily. Blood pressure screening. Blood pressure changes and medicines to control blood pressure can make you feel dizzy. Depression screening. You may be more likely to have a fall if you have a fear of falling, feel depressed, or feel unable to do activities that you used to do. Alcohol use screening. Using too much alcohol can affect your balance and may make you more likely to have a fall. Follow these instructions at home: Lifestyle Do not drink alcohol if: Your health care provider tells you not to drink. If you drink alcohol: Limit how much you have to: 0-1 drink a day for women. 0-2 drinks a day for men. Know how much alcohol is in your drink. In the [place] one drink equals one 12 oz bottle of beer (355 mL), one 5 oz glass of wine (148 mL), or one 1 oz glass of hard liquor (44 mL). Do not use any products that contain nicotine or tobacco. These products include cigarettes, chewing tobacco, and vaping devices, such as e-cigarettes. If you need help quitting, ask your health care provider. Activity  Follow a regular exercise program to stay fit. This will help you maintain your balance. Ask your health care provider what types of exercise are appropriate for you. If you need a cane or walker, use it as recommended by your health care provider. Wear supportive shoes that have nonskid soles. Safety  Remove any tripping hazards, such as rugs, cords, and clutter. Install safety equipment such as grab bars in bathrooms and safety rails on stairs. Keep rooms and walkways   well-lit. General instructions Talk with your health care provider about your risks for falling. Tell your health care provider if: You fall. Be sure to tell your health care provider about all falls, even ones that seem minor. You feel dizzy, tiredness (fatigue), or off-balance. Take over-the-counter and prescription medicines only as told by your health care provider. These include  supplements. Eat a healthy diet and maintain a healthy weight. A healthy diet includes low-fat dairy products, low-fat (lean) meats, and fiber from whole grains, beans, and lots of fruits and vegetables. Stay current with your vaccines. Schedule regular health, dental, and eye exams. Summary Having a healthy lifestyle and getting preventive care can help to protect your health and wellness after age 81. Screening and testing are the best way to find a health problem early and help you avoid having a fall. Early diagnosis and treatment give you the best chance for managing medical conditions that are more common for people who are older than age 81. Falls are a major cause of broken bones and head injuries in people who are older than age 81. Take precautions to prevent a fall at home. Work with your health care provider to learn what changes you can make to improve your health and wellness and to prevent falls. This information is not intended to replace advice given to you by your health care provider. Make sure you discuss any questions you have with your health care provider. Document Revised: 06/17/2020 Document Reviewed: 06/17/2020 Elsevier Patient Education  2023 Elsevier Inc.  

## 2021-05-28 NOTE — Assessment & Plan Note (Signed)
Healing well.  Pain well controlled.  No complications. ?

## 2021-06-06 ENCOUNTER — Encounter: Payer: Self-pay | Admitting: Emergency Medicine

## 2021-06-09 DIAGNOSIS — M546 Pain in thoracic spine: Secondary | ICD-10-CM | POA: Diagnosis not present

## 2021-06-09 DIAGNOSIS — M542 Cervicalgia: Secondary | ICD-10-CM | POA: Diagnosis not present

## 2021-06-09 DIAGNOSIS — M5413 Radiculopathy, cervicothoracic region: Secondary | ICD-10-CM | POA: Diagnosis not present

## 2021-06-10 NOTE — Telephone Encounter (Signed)
This patient needs to establish care with a PCP and will also need behavioral health evaluation.  Do not schedule this patient with me.  I will not accept her as a new patient.

## 2021-06-11 DIAGNOSIS — M546 Pain in thoracic spine: Secondary | ICD-10-CM | POA: Diagnosis not present

## 2021-06-11 DIAGNOSIS — M542 Cervicalgia: Secondary | ICD-10-CM | POA: Diagnosis not present

## 2021-06-11 DIAGNOSIS — M5413 Radiculopathy, cervicothoracic region: Secondary | ICD-10-CM | POA: Diagnosis not present

## 2021-06-16 DIAGNOSIS — M546 Pain in thoracic spine: Secondary | ICD-10-CM | POA: Diagnosis not present

## 2021-06-16 DIAGNOSIS — M5413 Radiculopathy, cervicothoracic region: Secondary | ICD-10-CM | POA: Diagnosis not present

## 2021-06-16 DIAGNOSIS — M542 Cervicalgia: Secondary | ICD-10-CM | POA: Diagnosis not present

## 2021-06-18 DIAGNOSIS — M532X4 Spinal instabilities, thoracic region: Secondary | ICD-10-CM | POA: Diagnosis not present

## 2021-06-23 DIAGNOSIS — M5413 Radiculopathy, cervicothoracic region: Secondary | ICD-10-CM | POA: Diagnosis not present

## 2021-06-23 DIAGNOSIS — M542 Cervicalgia: Secondary | ICD-10-CM | POA: Diagnosis not present

## 2021-06-23 DIAGNOSIS — M546 Pain in thoracic spine: Secondary | ICD-10-CM | POA: Diagnosis not present

## 2021-06-25 DIAGNOSIS — M542 Cervicalgia: Secondary | ICD-10-CM | POA: Diagnosis not present

## 2021-06-25 DIAGNOSIS — M5413 Radiculopathy, cervicothoracic region: Secondary | ICD-10-CM | POA: Diagnosis not present

## 2021-06-25 DIAGNOSIS — M546 Pain in thoracic spine: Secondary | ICD-10-CM | POA: Diagnosis not present

## 2021-06-27 DIAGNOSIS — M5413 Radiculopathy, cervicothoracic region: Secondary | ICD-10-CM | POA: Diagnosis not present

## 2021-06-27 DIAGNOSIS — M546 Pain in thoracic spine: Secondary | ICD-10-CM | POA: Diagnosis not present

## 2021-06-27 DIAGNOSIS — M542 Cervicalgia: Secondary | ICD-10-CM | POA: Diagnosis not present

## 2021-06-30 DIAGNOSIS — M542 Cervicalgia: Secondary | ICD-10-CM | POA: Diagnosis not present

## 2021-06-30 DIAGNOSIS — M546 Pain in thoracic spine: Secondary | ICD-10-CM | POA: Diagnosis not present

## 2021-06-30 DIAGNOSIS — M5413 Radiculopathy, cervicothoracic region: Secondary | ICD-10-CM | POA: Diagnosis not present

## 2021-07-02 DIAGNOSIS — M542 Cervicalgia: Secondary | ICD-10-CM | POA: Diagnosis not present

## 2021-07-02 DIAGNOSIS — M546 Pain in thoracic spine: Secondary | ICD-10-CM | POA: Diagnosis not present

## 2021-07-02 DIAGNOSIS — M5413 Radiculopathy, cervicothoracic region: Secondary | ICD-10-CM | POA: Diagnosis not present

## 2021-07-04 DIAGNOSIS — M542 Cervicalgia: Secondary | ICD-10-CM | POA: Diagnosis not present

## 2021-07-04 DIAGNOSIS — M546 Pain in thoracic spine: Secondary | ICD-10-CM | POA: Diagnosis not present

## 2021-07-04 DIAGNOSIS — M5413 Radiculopathy, cervicothoracic region: Secondary | ICD-10-CM | POA: Diagnosis not present

## 2021-07-08 DIAGNOSIS — M542 Cervicalgia: Secondary | ICD-10-CM | POA: Diagnosis not present

## 2021-07-08 DIAGNOSIS — M5413 Radiculopathy, cervicothoracic region: Secondary | ICD-10-CM | POA: Diagnosis not present

## 2021-07-08 DIAGNOSIS — M546 Pain in thoracic spine: Secondary | ICD-10-CM | POA: Diagnosis not present

## 2021-07-11 ENCOUNTER — Telehealth: Payer: Self-pay

## 2021-07-11 NOTE — Telephone Encounter (Signed)
Pt states she is in severe pain and is needing something to help her with her pain. She has taken Tylenol, Ibuprofen and Gabapentin to which neither of those are helping with her pain. Pt states she hs been doing PT for 3x a week and she does not know if it is helping as well.

## 2021-07-14 ENCOUNTER — Telehealth: Payer: Self-pay | Admitting: *Deleted

## 2021-07-14 ENCOUNTER — Other Ambulatory Visit (HOSPITAL_BASED_OUTPATIENT_CLINIC_OR_DEPARTMENT_OTHER): Payer: Self-pay

## 2021-07-14 ENCOUNTER — Other Ambulatory Visit: Payer: Self-pay | Admitting: Emergency Medicine

## 2021-07-14 DIAGNOSIS — S22080A Wedge compression fracture of T11-T12 vertebra, initial encounter for closed fracture: Secondary | ICD-10-CM

## 2021-07-14 MED ORDER — TRAMADOL HCL 50 MG PO TABS
ORAL_TABLET | ORAL | 1 refills | Status: DC
  Filled 2021-07-14: qty 20, 5d supply, fill #0

## 2021-07-14 NOTE — Telephone Encounter (Signed)
Patient called and states she will like a referral to a neurologist for her back pain. States she was seen at ortho when she fell and fractured her back but has not been back since. Please advise

## 2021-07-14 NOTE — Telephone Encounter (Signed)
Prescription for tramadol was sent last Friday night to pharmacy of record.

## 2021-07-14 NOTE — Telephone Encounter (Signed)
Sounds like she needs follow-up with Ortho, not neuro.  Please clarify.  Thanks

## 2021-07-14 NOTE — Telephone Encounter (Signed)
Called patient and informed her that a rx for tramadol  was sent to the pharmacy on file

## 2021-07-14 NOTE — Telephone Encounter (Signed)
Referral to neurosurgeon placed today.  Thanks.

## 2021-07-15 DIAGNOSIS — M5413 Radiculopathy, cervicothoracic region: Secondary | ICD-10-CM | POA: Diagnosis not present

## 2021-07-15 DIAGNOSIS — M542 Cervicalgia: Secondary | ICD-10-CM | POA: Diagnosis not present

## 2021-07-15 DIAGNOSIS — M546 Pain in thoracic spine: Secondary | ICD-10-CM | POA: Diagnosis not present

## 2021-07-21 ENCOUNTER — Other Ambulatory Visit: Payer: Self-pay | Admitting: Emergency Medicine

## 2021-07-21 DIAGNOSIS — E039 Hypothyroidism, unspecified: Secondary | ICD-10-CM

## 2021-08-07 ENCOUNTER — Ambulatory Visit (HOSPITAL_COMMUNITY)
Admission: RE | Admit: 2021-08-07 | Discharge: 2021-08-07 | Disposition: A | Discharge status: Home / Self Care | Payer: Medicare HMO | Source: Ambulatory Visit | Attending: Cardiology | Admitting: Cardiology

## 2021-08-07 DIAGNOSIS — I701 Atherosclerosis of renal artery: Secondary | ICD-10-CM

## 2021-08-18 ENCOUNTER — Telehealth: Payer: Self-pay | Admitting: Emergency Medicine

## 2021-08-18 DIAGNOSIS — Z13820 Encounter for screening for osteoporosis: Secondary | ICD-10-CM

## 2021-08-18 NOTE — Telephone Encounter (Signed)
Okay to order DEXA scan.

## 2021-08-18 NOTE — Telephone Encounter (Signed)
Can you please schedule this patient for her Dexa Scan at the [place] office. Thank you

## 2021-08-18 NOTE — Telephone Encounter (Signed)
[place] from [facility] would like a Bone Density Scan ordered due to the pt suffering a fracture.   Please advise

## 2021-08-27 ENCOUNTER — Inpatient Hospital Stay: Admission: RE | Admit: 2021-08-27 | Payer: Medicare HMO | Source: Ambulatory Visit

## 2021-08-27 NOTE — Telephone Encounter (Signed)
Pt called in and states Radiology dept did not receive order for Bone Density.   Informed pt that order was placed on 7.10.23, and that it should be visible to their office.   Please contact pt with any additional questions.

## 2021-09-05 DIAGNOSIS — H35373 Puckering of macula, bilateral: Secondary | ICD-10-CM | POA: Diagnosis not present

## 2021-09-05 DIAGNOSIS — Z961 Presence of intraocular lens: Secondary | ICD-10-CM | POA: Diagnosis not present

## 2021-09-05 DIAGNOSIS — H2511 Age-related nuclear cataract, right eye: Secondary | ICD-10-CM | POA: Diagnosis not present

## 2021-09-05 DIAGNOSIS — H348312 Tributary (branch) retinal vein occlusion, right eye, stable: Secondary | ICD-10-CM | POA: Diagnosis not present

## 2021-09-05 DIAGNOSIS — H04123 Dry eye syndrome of bilateral lacrimal glands: Secondary | ICD-10-CM | POA: Diagnosis not present

## 2021-09-08 ENCOUNTER — Other Ambulatory Visit: Payer: Self-pay | Admitting: Cardiovascular Disease

## 2021-09-08 ENCOUNTER — Other Ambulatory Visit: Payer: Self-pay | Admitting: Emergency Medicine

## 2021-09-27 ENCOUNTER — Other Ambulatory Visit: Payer: Self-pay | Admitting: Cardiovascular Disease

## 2021-10-03 ENCOUNTER — Telehealth: Payer: Self-pay

## 2021-10-03 NOTE — Telephone Encounter (Addendum)
Pt has stated she is very depressed. Pt was offered to come in and see PCP she states Provider knows her and this has happened before. She states she is being treated for Anxiety but not her depression and needs a mediation sent in.

## 2021-10-04 NOTE — Telephone Encounter (Signed)
Needs OV however. Thanks.

## 2021-10-05 ENCOUNTER — Encounter: Payer: Self-pay | Admitting: Cardiovascular Disease

## 2021-10-06 MED ORDER — DOFETILIDE 125 MCG PO CAPS: ORAL_CAPSULE | ORAL | 0 refills | Status: DC

## 2021-10-06 NOTE — Telephone Encounter (Signed)
Pt scheduled for OV.  

## 2021-10-07 ENCOUNTER — Ambulatory Visit (INDEPENDENT_AMBULATORY_CARE_PROVIDER_SITE_OTHER): Payer: Medicare HMO | Admitting: Emergency Medicine

## 2021-10-07 ENCOUNTER — Encounter: Payer: Self-pay | Admitting: Emergency Medicine

## 2021-10-07 VITALS — BP 138/78 | HR 85 | Temp 98.3°F | Ht 64.0 in | Wt 147.4 lb

## 2021-10-07 DIAGNOSIS — I480 Paroxysmal atrial fibrillation: Secondary | ICD-10-CM

## 2021-10-07 DIAGNOSIS — R69 Illness, unspecified: Secondary | ICD-10-CM | POA: Diagnosis not present

## 2021-10-07 DIAGNOSIS — E039 Hypothyroidism, unspecified: Secondary | ICD-10-CM | POA: Diagnosis not present

## 2021-10-07 DIAGNOSIS — R5383 Other fatigue: Secondary | ICD-10-CM | POA: Diagnosis not present

## 2021-10-07 DIAGNOSIS — E7800 Pure hypercholesterolemia, unspecified: Secondary | ICD-10-CM | POA: Diagnosis not present

## 2021-10-07 DIAGNOSIS — I25810 Atherosclerosis of coronary artery bypass graft(s) without angina pectoris: Secondary | ICD-10-CM | POA: Diagnosis not present

## 2021-10-07 DIAGNOSIS — I10 Essential (primary) hypertension: Secondary | ICD-10-CM

## 2021-10-07 DIAGNOSIS — F331 Major depressive disorder, recurrent, moderate: Secondary | ICD-10-CM | POA: Diagnosis not present

## 2021-10-07 LAB — CBC WITH DIFFERENTIAL/PLATELET
Basophils Absolute: 0.1 10*3/uL (ref 0.0–0.1)
Basophils Relative: 1 % (ref 0.0–3.0)
Eosinophils Absolute: 0.1 10*3/uL (ref 0.0–0.7)
Eosinophils Relative: 1.5 % (ref 0.0–5.0)
HCT: 44 % (ref 36.0–46.0)
Hemoglobin: 14.9 g/dL (ref 12.0–15.0)
Lymphocytes Relative: 35.3 % (ref 12.0–46.0)
Lymphs Abs: 2.2 10*3/uL (ref 0.7–4.0)
MCHC: 34 g/dL (ref 30.0–36.0)
MCV: 88.9 fl (ref 78.0–100.0)
Monocytes Absolute: 0.6 10*3/uL (ref 0.1–1.0)
Monocytes Relative: 9.4 % (ref 3.0–12.0)
Neutro Abs: 3.2 10*3/uL (ref 1.4–7.7)
Neutrophils Relative %: 52.8 % (ref 43.0–77.0)
Platelets: 241 10*3/uL (ref 150.0–400.0)
RBC: 4.95 Mil/uL (ref 3.87–5.11)
RDW: 13.9 % (ref 11.5–15.5)
WBC: 6.1 10*3/uL (ref 4.0–10.5)

## 2021-10-07 LAB — TSH: TSH: 1.17 u[IU]/mL (ref 0.35–5.50)

## 2021-10-07 LAB — VITAMIN B12: Vitamin B-12: 793 pg/mL (ref 211–911)

## 2021-10-07 LAB — HEMOGLOBIN A1C: Hgb A1c MFr Bld: 6.1 % (ref 4.6–6.5)

## 2021-10-07 NOTE — Progress Notes (Signed)
[name] 81 y.o.   Chief Complaint  Patient presents with   Depression    Progressively getting worse, Patient thinks she is on too much medication    HISTORY OF PRESENT ILLNESS: This is a 81 y.o. female complaining of lack of energy and feeling depressed for the past several weeks.  Has history of chronic depression on Celexa 40 mg daily.  Has been referred to psychiatrist in the past but never followed up.  Non-smoker but drinks alcohol almost every night. No other complaints or medical concerns today.  HPI   Prior to Admission medications   Medication Sig Start Date End Date Taking? Authorizing Provider  amLODipine (NORVASC) 5 MG tablet TAKE 1 TABLET (5 MG TOTAL) BY MOUTH IN THE MORNING AND AT BEDTIME 09/08/21  Yes Croitoru, Mihai, MD  ascorbic acid (VITAMIN C) 250 MG CHEW Chew 750 mg by mouth daily.   Yes [provider]  aspirin EC 81 MG tablet Take 81 mg by mouth daily.    Yes [provider]  Biotin 10000 MCG TABS Take 10,000 mcg by mouth daily.   Yes [provider]  carvedilol (COREG) 6.25 MG tablet TAKE 1 TABLET BY MOUTH 2 TIMES DAILY WITH A MEAL. 09/08/21  Yes Croitoru, Mihai, MD  Cholecalciferol (VITAMIN D) 50 MCG (2000 UT) tablet Take 2,000 Units by mouth daily.   Yes [provider]  citalopram (CELEXA) 40 MG tablet TAKE 1 TABLET BY MOUTH EVERY DAY 09/08/21  Yes Celestial Barnfield, Ines Bloomer, MD  clopidogrel (PLAVIX) 75 MG tablet TAKE 1 TABLET BY MOUTH DAILY WITH BREAKFAST. 02/06/21  Yes Croitoru, Mihai, MD  Cyanocobalamin (B-12) 5000 MCG CAPS Take 10,000 mcg by mouth daily.   Yes [provider]  dofetilide (TIKOSYN) 125 MCG capsule TAKE 1 CAPSULE BY MOUTH EVERY 12 HOURS. ([place]) 10/06/21  Yes Croitoru, Mihai, MD  furosemide (LASIX) 40 MG tablet TAKE 1 TABLET BY MOUTH EVERY OTHER DAY Patient taking differently: 20 mg as needed (as needed for edema). 11/13/20  Yes Croitoru, Mihai, MD  furosemide (LASIX) 80 MG tablet  Take 1 tablet (80 mg total) by mouth daily. 10/23/20  Yes Croitoru, Mihai, MD  levothyroxine (SYNTHROID) 25 MCG tablet Take 1 tablet (25 mcg total) by mouth daily before breakfast. 05/20/21  Yes Joanell Cressler, Ines Bloomer, MD  loteprednol (LOTEMAX) 0.5 % ophthalmic suspension Place 2 drops into both eyes See admin instructions. Instill 2 drops into both eyes 2 times a day twice weekly   Yes [provider]  Magnesium 200 MG TABS Take 200 mg by mouth daily.   Yes [provider]  methocarbamol (ROBAXIN) 500 MG tablet Take 1 tablet (500 mg total) by mouth 2 (two) times daily. 05/28/21  Yes Kaydence Baba, Ines Bloomer, MD  Multiple Vitamin (MULTIVITAMIN WITH MINERALS) TABS tablet Take 1 tablet by mouth 2 (two) times daily.   Yes [provider]  Multiple Vitamins-Minerals (ZINC PO) Take 150 mg by mouth daily.   Yes [provider]  nitroGLYCERIN (NITROSTAT) 0.4 MG SL tablet Place 1 tablet (0.4 mg total) under the tongue every 5 (five) minutes x 3 doses as needed for chest pain. 05/15/15  Yes Lyda Jester M, PA-C  oxyCODONE-acetaminophen (PERCOCET/ROXICET) 5-325 MG tablet Take 1-2 tablets by mouth every 6 (six) hours as needed for severe pain. 05/12/21  Yes Lacretia [facility], MD  rosuvastatin (CRESTOR) 40 MG tablet Take 1 tablet (40 mg total) by mouth daily. 11/07/20 11/02/21 Yes Cheryln Manly, NP  traMADol Veatrice [place]bon)  50 MG tablet Take 1 tablet by mouth every 6-8 hours as needed for severe pain. 07/11/21  Yes   VITAMIN D-VITAMIN K PO Take 1 capsule by mouth daily.   Yes [provider]    Allergies  Allergen Reactions   Brilinta [Ticagrelor] Shortness Of Breath   Atorvastatin Other (See Comments)    Possible cause of fatigue/malaise   Clonidine Derivatives Other (See Comments)    Lowers heart rate   Crestor [Rosuvastatin Calcium] Other (See Comments)    Joint/muscle aches   Exforge [Amlodipine Besylate-Valsartan] Itching and Rash   Spironolactone Other (See Comments)     Contraindicated with history of hyperkalemia    Patient Active Problem List   Diagnosis Date Noted   Compression fracture of T12 vertebra ([place]) 05/28/2021   Chronic neck pain 04/07/2021   Chronic bilateral low back pain without sciatica 04/07/2021   Exertional angina (HCC)    Renal artery stenosis ([place]) 06/26/2020   Cervical spine disease 05/30/2020   Statin intolerance 11/22/2019   Aortic atherosclerosis ([place]) 11/22/2019   Hypothyroidism (acquired) 02/08/2018   Coronary artery disease involving coronary bypass graft of native heart without angina pectoris 10/22/2017   Dyslipidemia 10/22/2017   Cerebrovascular small vessel disease 04/27/2017   Cerebrovascular disease 03/25/2017   Claudication in peripheral vascular disease ([place]) 02/15/2017   Hyperparathyroidism ([place]) 05/13/2016   Cardiac pacemaker in situ 01/10/2016   Benign neoplasm of ascending colon    Long term current use of anticoagulant    CKD (chronic kidney disease), stage II    Chronic diastolic heart failure ([place]) 10/19/2015   Atypical atrial flutter (HCC)    Paroxysmal atrial flutter ([place]) 08/23/2015   Chronic fatigue 06/27/2015   Stented coronary artery    S/P CABG x 3 01/04/15 01/10/2015   CAD (coronary artery disease) 01/04/2015   PAF (paroxysmal atrial fibrillation) ([place]) 01/22/2012   Sinus node dysfunction ([place]) 01/22/2012   S/P placement of cardiac pacemaker, medtronic adapta 01/21/12 01/22/2012   PVD (peripheral vascular disease), hx stents to bil SFAs 02/2010 01/22/2012   Hyperlipidemia 12/31/2009   Anxiety 12/31/2009   DEPRESSION 12/31/2009   Essential hypertension 12/31/2009   Coronary atherosclerosis 12/31/2009   OSTEOARTHRITIS, HAND 12/31/2009   [place] DISEASE, CERVICAL 12/31/2009   [place] DISEASE, LUMBAR 12/31/2009    Past Medical History:  Diagnosis Date   ANXIETY 12/31/2009   Aortic insufficiency    a. mod by echo 2017.   CAD (coronary artery disease)    a. s/p CABGx3 and LAA clipping in  12/2014, NSTEMI 05/2015 s/p DES to native RCA and SVG-dRCA; occluded ramus-SVG was treated medically). c. neg [facility]uc 10/2015 at [facility].   Chronic diastolic CHF (congestive heart failure) (HCC)    Chronic fatigue    CKD (chronic kidney disease), stage II    DEPRESSION 12/31/2009   [place] DISEASE, CERVICAL 12/31/2009   [place] DISEASE, LUMBAR 12/31/2009   DIVERTICULITIS, HX OF 12/31/2009   Essential hypertension    Gait abnormality 03/25/2017   GLUCOSE INTOLERANCE 12/31/2009   Habitual alcohol use    History of stroke    self reports " they say i have had some pin strokes"    Hypercalcemia    Hyperkalemia    Hyperlipidemia 12/31/2009   Hypothyroidism    MENOPAUSE, EARLY 12/31/2009   NSTEMI (non-ST elevated myocardial infarction) ([place]) 05/11/2015   Orthostatic hypotension    OSTEOARTHRITIS, HAND    Pacemaker 01/21/2012   MDT Adapta dual chamber   Paroxysmal atrial flutter ([place])    Persistent  atrial fibrillation (HCC)    PVD (peripheral vascular disease), hx stents to bil SFAs 02/2010    Rash, skin     superficial raised red pencil point sized rash bilateral forearms; states " it started when i started the Plavix "    Ruptured left breast implant    Symptomatic sinus bradycardia 01/22/2012   Syncope    a. 11/2015 ? due to medications.   Syncope    reports on 05-10-17 " i passed out 2 months ago in the bathroom and broke some ribs"     Tachy-brady syndrome ([place])    a. s/p MDT PPM 2013.   Tricuspid regurgitation     Past Surgical History:  Procedure Laterality Date   ABDOMINAL AORTOGRAM W/LOWER EXTREMITY Bilateral 02/15/2017   Procedure: ABDOMINAL AORTOGRAM W/LOWER EXTREMITY;  Surgeon: Lorretta Harp, MD;  Location: [facility];  Service: Cardiovascular;  Laterality: Bilateral;  bilat   ABDOMINAL AORTOGRAM W/LOWER EXTREMITY N/A 10/06/2018   Procedure: ABDOMINAL AORTOGRAM W/LOWER EXTREMITY;  Surgeon: Lorretta Harp, MD;  Location: [facility];  Service: Cardiovascular;   Laterality: N/A;   Ablation of typical atrial flutter (CTI line)  09/05/2016   Dr Antonieta Pert at [place]   Removed 01/2018   BREAST IMPLANT REMOVAL Bilateral 01/14/2018   Procedure: REMOVAL BILATERAL BREAST IMPLANTS;  Surgeon: Irene Limbo, MD;  Location: [facility];  Service: Plastics;  Laterality: Bilateral;   CAPSULECTOMY Bilateral 01/14/2018   Procedure: CAPSULECTOMY;  Surgeon: Irene Limbo, MD;  Location: [facility];  Service: Plastics;  Laterality: Bilateral;   CARDIAC CATHETERIZATION N/A 01/01/2015   Procedure: Left Heart Cath and Coronary Angiography;  Surgeon: Jettie Booze, MD;  Location: [facility];  Service: Cardiovascular;  Laterality: N/A;   CARDIAC CATHETERIZATION N/A 05/12/2015   Procedure: Left Heart Cath and Coronary Angiography;  Surgeon: Troy Sine, MD;  Location: [facility];  Service: Cardiovascular;  Laterality: N/A;   CARDIAC CATHETERIZATION N/A 05/12/2015   Procedure: Coronary Stent Intervention;  Surgeon: Troy Sine, MD;  Location: [employer] [facility];  Service: Cardiovascular;  Laterality: N/A;   CARDIOVERSION N/A 02/01/2015   Procedure: CARDIOVERSION;  Surgeon: Lelon Perla, MD;  Location: [facility];  Service: Cardiovascular;  Laterality: N/A;   CARDIOVERSION N/A 08/30/2015   Procedure: CARDIOVERSION;  Surgeon: Sanda Klein, MD;  Location: [facility];  Service: Cardiovascular;  Laterality: N/A;   CARPAL TUNNEL RELEASE  2008   "right hand/thumb; carpal tunnel repair; got rid of arthritis" (01/21/2012)   CLIPPING OF ATRIAL APPENDAGE N/A 01/04/2015   Procedure: CLIPPING OF ATRIAL APPENDAGE;  Surgeon: Grace Isaac, MD;  Location: [facility];  Service: Open Heart Surgery;  Laterality: N/A;   COLONOSCOPY N/A 12/21/2015   Procedure: COLONOSCOPY;  Surgeon: Milus Banister, MD;  Location: [facility];  Service: Endoscopy;  Laterality: N/A;   CORONARY ARTERY BYPASS GRAFT  N/A 01/04/2015   Procedure: CORONARY ARTERY BYPASS GRAFTING (CABG) x 3 using left internal mammory artery and greater saphenous vein right leg harvested endoscopically.;  Surgeon: Grace Isaac, MD;  LIMA-LAD, SVG-RI, SVG-PDA   CORONARY STENT INTERVENTION N/A 11/07/2020   Procedure: CORONARY STENT INTERVENTION;  Surgeon: Early Osmond, MD;  Location: [facility];  Service: Cardiovascular;  Laterality: N/A;   ESOPHAGOGASTRODUODENOSCOPY (EGD) WITH PROPOFOL N/A 12/18/2015   Procedure: ESOPHAGOGASTRODUODENOSCOPY (EGD) WITH PROPOFOL;  Surgeon: Milus Banister, MD;  Location: [facility];  Service:  Endoscopy;  Laterality: N/A;   FACELIFT, LOWER 2/3  1995   "mini" (01/21/2012)   GIVENS CAPSULE STUDY N/A 12/21/2015   Procedure: GIVENS CAPSULE STUDY;  Surgeon: Milus Banister, MD;  Location: [facility];  Service: Endoscopy;  Laterality: N/A;   LEFT HEART CATH AND CORS/GRAFTS ANGIOGRAPHY N/A 11/07/2020   Procedure: LEFT HEART CATH AND CORS/GRAFTS ANGIOGRAPHY;  Surgeon: Early Osmond, MD;  Location: [facility];  Service: Cardiovascular;  Laterality: N/A;   Lower Arterial Examination  10/28/2011   R. SFA stent mild-moderate mixed density plaque with elevated velocities consistent with 50% diameter reduction. L. SFA stent moderate mixed denisty plaque at mid to distal level consistent with 50-69% diameter reduction.   LOWER EXTREMITY ANGIOGRAPHY N/A 02/15/2017   Procedure: LOWER EXTREMITY ANGIOGRAPHY;  Surgeon: Lorretta Harp, MD;  Location: [facility];  Service: Cardiovascular;  Laterality: N/A;   OOPHORECTOMY  ~1979   PARATHYROIDECTOMY N/A 05/13/2017   Procedure: PARATHYROIDECTOMY;  Surgeon: Armandina Gemma, MD;  Location: [facility];  Service: General;  Laterality: N/A;   PARTIAL COLECTOMY  2010   PERIPHERAL ARTERIAL STENT GRAFT  2012; 2012   "LLE; RLE" (01/21/2012)   PERIPHERAL VASCULAR BALLOON ANGIOPLASTY Right 02/15/2017   Procedure: PERIPHERAL VASCULAR BALLOON ANGIOPLASTY;   Surgeon: Lorretta Harp, MD;  Location: [facility];  Service: Cardiovascular;  Laterality: Right;  SFA     PERIPHERAL VASCULAR INTERVENTION Right 10/06/2018   Procedure: PERIPHERAL VASCULAR INTERVENTION;  Surgeon: Lorretta Harp, MD;  Location: [facility];  Service: Cardiovascular;  Laterality: Right;   PERMANENT PACEMAKER INSERTION N/A 01/21/2012   Medtronic Adapta L implanted by Dr Sallyanne Kuster for tachy/brady syndrome   POSTERIOR CERVICAL LAMINECTOMY  1985   TEE WITHOUT CARDIOVERSION N/A 01/04/2015   Procedure: TRANSESOPHAGEAL ECHOCARDIOGRAM (TEE);  Surgeon: Grace Isaac, MD;  Location: [facility];  Service: Open Heart Surgery;  Laterality: N/A;   TEE WITHOUT CARDIOVERSION N/A 02/01/2015   Procedure: TRANSESOPHAGEAL ECHOCARDIOGRAM (TEE);  Surgeon: Lelon Perla, MD;  Location: [facility] ENDOSCOPY;  Service: Cardiovascular;  Laterality: N/A;   TEE WITHOUT CARDIOVERSION N/A 08/30/2015   Procedure: TRANSESOPHAGEAL ECHOCARDIOGRAM (TEE);  Surgeon: Sanda Klein, MD;  Location: [facility];  Service: Cardiovascular;  Laterality: N/A;   VAGINAL HYSTERECTOMY  1975    Social History   Socioeconomic History   Marital status: Widowed    Spouse name: Not on file   Number of children: 3   Years of education: Not on file   Highest education level: Not on file  Occupational History   Occupation: Retired Teacher, 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  Retired.   Social Determinants of  Health   Financial Resource Strain: Not on file  Food Insecurity: Not on file  Transportation Needs: Not on file  Physical Activity: Not on file  Stress:  Not on file  Social Connections: Not on file  Intimate Partner Violence: Not on file    Family History  Problem Relation Age of Onset   Heart disease Mother    Hypertension Mother    Stroke Mother    Stroke Father    Heart disease Father    Heart disease Brother    Hypertension Brother        2 brothers   CVA Brother        2 brothers   Heart attack Brother        2 brothers     Review of Systems  Constitutional: Negative.  Negative for chills and fever.  HENT: Negative.  Negative for congestion and sore throat.   Respiratory: Negative.  Negative for cough and shortness of breath.   Cardiovascular: Negative.  Negative for chest pain and palpitations.  Gastrointestinal: Negative.  Negative for abdominal pain, diarrhea, nausea and vomiting.  Genitourinary: Negative.  Negative for dysuria.  Skin: Negative.  Negative for rash.  Neurological: Negative.  Negative for dizziness and headaches.  All other systems reviewed and are negative.  Today's Vitals   10/07/21 1354 10/07/21 1402 10/07/21 1415  BP: (!) 162/84 (!) 168/88 138/78  Pulse: 85    Temp: 98.3 F (36.8 C)    TempSrc: Oral    SpO2: 93%    Weight: 147 lb 6 oz (66.8 kg)    Height: '5\' 4"'$  (1.626 m)     Body mass index is 25.3 kg/m.   Physical Exam Vitals reviewed.  Constitutional:      Appearance: Normal appearance.  HENT:     Head: Normocephalic.     Mouth/Throat:     Mouth: Mucous membranes are moist.     Pharynx: Oropharynx is clear.  Eyes:     Extraocular Movements: Extraocular movements intact.     Conjunctiva/sclera: Conjunctivae normal.     Pupils: Pupils are equal, round, and reactive to light.  Cardiovascular:     Rate and Rhythm: Normal rate and regular rhythm.     Pulses: Normal pulses.     Heart sounds: Normal heart sounds.   Pulmonary:     Effort: Pulmonary effort is normal.     Breath sounds: Normal breath sounds.  Musculoskeletal:     Cervical back: No tenderness.  Lymphadenopathy:     Cervical: No cervical adenopathy.  Skin:    General: Skin is warm and dry.     Capillary Refill: Capillary refill takes less than 2 seconds.  Neurological:     General: No focal deficit present.     Mental Status: She is alert and oriented to person, place, and time.  Psychiatric:        Mood and Affect: Mood normal.        Behavior: Behavior normal.      ASSESSMENT & PLAN: A total of 49 minutes was spent with the patient and counseling/coordination of care regarding preparing for this visit, review of most recent office visit notes, review of multiple chronic medical problems and their management, review of all medications, review of most recent blood work results, diagnosis of active depression and management, need for psychiatric evaluation, prognosis, documentation, and need for follow-up.  Problem List Items Addressed This Visit       Cardiovascular and Mediastinum   PAF (paroxysmal atrial fibrillation) (HCC) (Chronic)    Presently in sinus rhythm. Continue Tikosyn 125 mg twice a day for rhythm control. Continue Plavix 75 mg daily.  Essential hypertension    Well-controlled hypertension. Continue amlodipine 5 mg daily.      CAD (coronary artery disease)    Stable.  No recent anginal episodes Continue baby daily aspirin.        Endocrine   Hypothyroidism (acquired)    Not presently taking Synthroid. Lack of energy may be due to hypothyroidism. TSH done today.      Relevant Orders   TSH     Other   Hyperlipidemia    Stable.  Continue rosuvastatin 40 mg daily.      Moderate episode of recurrent major depressive disorder ([place]) - Primary    Currently active and affecting quality of life. Needs psychiatric evaluation.  Needs medication adjustment.      Relevant Orders   Ambulatory  referral to Psychiatry   Lack of energy    Differential diagnosis discussed. Blood work done today.      Relevant Orders   CBC with Differential/Platelet   Comprehensive metabolic panel   Vitamin O87   Hemoglobin A1c   TSH   Patient Instructions  Major Depressive Disorder, Adult Major depressive disorder is a mental health condition. This disorder affects feelings. It can also affect the body. Symptoms of this condition last most of the day, almost every day, for 2 weeks. This disorder can affect: Relationships. Daily activities, such as work and school. Activities that you normally like to do. What are the causes? The cause of this condition is not known. The disorder is likely caused by a mix of things, including: Your personality, such as being a shy person. Your behavior, or how you act toward others. Your thoughts and feelings. Too much alcohol or drugs. How you react to stress. Health and mental problems that you have had for a long time. Things that hurt you in the past (trauma). Big changes in your life, such as divorce. What increases the risk? The following factors may make you more likely to develop this condition: Having family members with depression. Being a woman. Problems in the family. Low levels of some brain chemicals. Things that caused you pain as a child, especially if you lost a parent or were abused. A lot of stress in your life, such as from: Living without basic needs of life, such as food and shelter. Being treated poorly because of race, sex, or religion (discrimination). Health and mental problems that you have had for a long time. What are the signs or symptoms? The main symptoms of this condition are: Being sad all the time. Being grouchy all the time. Loss of interest in things and activities. Other symptoms include: Sleeping too much or too little. Eating too much or too little. Gaining or losing weight, without knowing why. Feeling  tired or having low energy. Being restless and weak. Feeling hopeless, worthless, or guilty. Trouble thinking clearly or making decisions. Thoughts of hurting yourself or others, or thoughts of ending your life. Spending a lot of time alone. Inability to complete common tasks of daily life. If you have very bad MDD, you may: Believe things that are not true. Hear, see, taste, or feel things that are not there. Have mild depression that lasts for at least 2 years. Feel very sad and hopeless. Have trouble speaking or moving. How is this treated? This condition may be treated with: Talk therapy. This teaches you to know bad thoughts, feelings, and actions and how to change them. This can also help you to communicate with others. This can be  done with members of your family. Medicines. These can be used to treat worry (anxiety), depression, or low levels of chemicals in the brain. Lifestyle changes. You may need to: Limit alcohol use. Limit drug use. Get regular exercise. Get plenty of sleep. Make healthy eating choices. Spend more time outdoors. Brain stimulation. This treatment excites the brain. This is done when symptoms are very bad or have not gotten better with other treatments. Follow these instructions at home: Activity Get regular exercise as told. Spend time outdoors as told. Make time to do the things you enjoy. Find ways to deal with stress. Try to: Meditate. Do deep breathing. Spend time in nature. Keep a journal. Return to your normal activities as told by your doctor. Ask your doctor what activities are safe for you. Alcohol and drug use If you drink alcohol: Limit how much you use to: 0-1 drink a day for women. 0-2 drinks a day for men. Be aware of how much alcohol is in your drink. In the [place] one drink equals one 12 oz bottle of beer (355 mL), one 5 oz glass of wine (148 mL), or one 1 oz glass of hard liquor (44 mL). Talk to your doctor about: Alcohol  use. Alcohol can affect some medicines. Any drug use. General instructions  Take over-the-counter and prescription medicines and herbal preparations only as told by your doctor. Eat a healthy diet. Get a lot of sleep. Think about joining a support group. Your doctor may be able to suggest one. Keep all follow-up visits as told by your doctor. This is important. Where to find more information: [employer] on Mental Illness: www.nami.[place]: https://carter.com/ American Psychiatric Association: www.psychiatry.org/patients-families/ Contact a doctor if: Your symptoms get worse. You get new symptoms. Get help right away if: You hurt yourself. You have serious thoughts about hurting yourself or others. You see, hear, taste, smell, or feel things that are not there. If you ever feel like you may hurt yourself or others, or have thoughts about taking your own life, get help right away. Go to your nearest emergency department or: Call your local emergency services (911 in the [place] Call a suicide crisis helpline, such as the [place] at 5748493001 or 988 in the [place]. This is open 24 hours a day in the [place] Text the Crisis Text Line at 8074947816 (in the [place].). Summary Major depressive disorder is a mental health condition. This disorder affects feelings. Symptoms of this condition last most of the day, almost every day, for 2 weeks. The symptoms of this disorder can cause problems with relationships and with daily activities. There are treatments and support for people who get this disorder. You may need more than one type of treatment. Get help right away if you have serious thoughts about hurting yourself or others. This information is not intended to replace advice given to you by your health care provider. Make sure you discuss any questions you have with your health care provider. Document Revised: 08/21/2020 Document  Reviewed: 01/07/2019 Elsevier Patient Education  2023 Amber, MD [facility]

## 2021-10-07 NOTE — Assessment & Plan Note (Addendum)
Presently in sinus rhythm. Continue Tikosyn 125 mg twice a day for rhythm control. Continue Plavix 75 mg daily.

## 2021-10-07 NOTE — Patient Instructions (Signed)
Major Depressive Disorder, Adult Major depressive disorder is a mental health condition. This disorder affects feelings. It can also affect the body. Symptoms of this condition last most of the day, almost every day, for 2 weeks. This disorder can affect: Relationships. Daily activities, such as work and school. Activities that you normally like to do. What are the causes? The cause of this condition is not known. The disorder is likely caused by a mix of things, including: Your personality, such as being a shy person. Your behavior, or how you act toward others. Your thoughts and feelings. Too much alcohol or drugs. How you react to stress. Health and mental problems that you have had for a long time. Things that hurt you in the past (trauma). Big changes in your life, such as divorce. What increases the risk? The following factors may make you more likely to develop this condition: Having family members with depression. Being a woman. Problems in the family. Low levels of some brain chemicals. Things that caused you pain as a child, especially if you lost a parent or were abused. A lot of stress in your life, such as from: Living without basic needs of life, such as food and shelter. Being treated poorly because of race, sex, or religion (discrimination). Health and mental problems that you have had for a long time. What are the signs or symptoms? The main symptoms of this condition are: Being sad all the time. Being grouchy all the time. Loss of interest in things and activities. Other symptoms include: Sleeping too much or too little. Eating too much or too little. Gaining or losing weight, without knowing why. Feeling tired or having low energy. Being restless and weak. Feeling hopeless, worthless, or guilty. Trouble thinking clearly or making decisions. Thoughts of hurting yourself or others, or thoughts of ending your life. Spending a lot of time alone. Inability to  complete common tasks of daily life. If you have very bad MDD, you may: Believe things that are not true. Hear, see, taste, or feel things that are not there. Have mild depression that lasts for at least 2 years. Feel very sad and hopeless. Have trouble speaking or moving. How is this treated? This condition may be treated with: Talk therapy. This teaches you to know bad thoughts, feelings, and actions and how to change them. This can also help you to communicate with others. This can be done with members of your family. Medicines. These can be used to treat worry (anxiety), depression, or low levels of chemicals in the brain. Lifestyle changes. You may need to: Limit alcohol use. Limit drug use. Get regular exercise. Get plenty of sleep. Make healthy eating choices. Spend more time outdoors. Brain stimulation. This treatment excites the brain. This is done when symptoms are very bad or have not gotten better with other treatments. Follow these instructions at home: Activity Get regular exercise as told. Spend time outdoors as told. Make time to do the things you enjoy. Find ways to deal with stress. Try to: Meditate. Do deep breathing. Spend time in nature. Keep a journal. Return to your normal activities as told by your doctor. Ask your doctor what activities are safe for you. Alcohol and drug use If you drink alcohol: Limit how much you use to: 0-1 drink a day for women. 0-2 drinks a day for men. Be aware of how much alcohol is in your drink. In the [place] one drink equals one 12 oz bottle of beer (355 mL),   one 5 oz glass of wine (148 mL), or one 1 oz glass of hard liquor (44 mL). Talk to your doctor about: Alcohol use. Alcohol can affect some medicines. Any drug use. General instructions  Take over-the-counter and prescription medicines and herbal preparations only as told by your doctor. Eat a healthy diet. Get a lot of sleep. Think about joining a support group.  Your doctor may be able to suggest one. Keep all follow-up visits as told by your doctor. This is important. Where to find more information: [employer] on Mental Illness: www.nami.[place]: https://carter.com/ American Psychiatric Association: www.psychiatry.org/patients-families/ Contact a doctor if: Your symptoms get worse. You get new symptoms. Get help right away if: You hurt yourself. You have serious thoughts about hurting yourself or others. You see, hear, taste, smell, or feel things that are not there. If you ever feel like you may hurt yourself or others, or have thoughts about taking your own life, get help right away. Go to your nearest emergency department or: Call your local emergency services (911 in the [place] Call a suicide crisis helpline, such as the [facility] at 860-733-4095 or 988 in the [place]. This is open 24 hours a day in the [place] Text the Crisis Text Line at (570)762-0388 (in the [place].). Summary Major depressive disorder is a mental health condition. This disorder affects feelings. Symptoms of this condition last most of the day, almost every day, for 2 weeks. The symptoms of this disorder can cause problems with relationships and with daily activities. There are treatments and support for people who get this disorder. You may need more than one type of treatment. Get help right away if you have serious thoughts about hurting yourself or others. This information is not intended to replace advice given to you by your health care provider. Make sure you discuss any questions you have with your health care provider. Document Revised: 08/21/2020 Document Reviewed: 01/07/2019 Elsevier Patient Education  [place].

## 2021-10-07 NOTE — Assessment & Plan Note (Signed)
Currently active and affecting quality of life. Needs psychiatric evaluation.  Needs medication adjustment.

## 2021-10-07 NOTE — Assessment & Plan Note (Signed)
Stable.  Continue rosuvastatin 40 mg daily.

## 2021-10-07 NOTE — Assessment & Plan Note (Signed)
Differential diagnosis discussed. Blood work done today.

## 2021-10-07 NOTE — Assessment & Plan Note (Signed)
Stable.  No recent anginal episodes Continue baby daily aspirin.

## 2021-10-07 NOTE — Assessment & Plan Note (Signed)
Well-controlled hypertension. Continue amlodipine 5 mg daily. 

## 2021-10-07 NOTE — Assessment & Plan Note (Signed)
Not presently taking Synthroid. Lack of energy may be due to hypothyroidism. TSH done today.

## 2021-10-08 ENCOUNTER — Encounter: Payer: Self-pay | Admitting: Cardiovascular Disease

## 2021-10-08 LAB — COMPREHENSIVE METABOLIC PANEL
ALT: 14 U/L (ref 0–35)
AST: 25 U/L (ref 0–37)
Albumin: 4.5 g/dL (ref 3.5–5.2)
Alkaline Phosphatase: 97 U/L (ref 39–117)
BUN: 20 mg/dL (ref 6–23)
CO2: 24 mEq/L (ref 19–32)
Calcium: 9.6 mg/dL (ref 8.4–10.5)
Chloride: 98 mEq/L (ref 96–112)
Creatinine, Ser: 1.29 mg/dL — ABNORMAL HIGH (ref 0.40–1.20)
GFR: 38.89 mL/min — ABNORMAL LOW (ref >60.00)
Glucose, Bld: 103 mg/dL — ABNORMAL HIGH (ref 70–99)
Potassium: 4.5 mEq/L (ref 3.5–5.1)
Sodium: 136 mEq/L (ref 135–145)
Total Bilirubin: 0.6 mg/dL (ref 0.2–1.2)
Total Protein: 8 g/dL (ref 6.0–8.3)

## 2021-10-09 ENCOUNTER — Encounter (HOSPITAL_COMMUNITY): Payer: Medicare HMO | Admitting: Psychiatry

## 2021-10-09 DIAGNOSIS — I509 Heart failure, unspecified: Secondary | ICD-10-CM | POA: Diagnosis not present

## 2021-10-09 DIAGNOSIS — I252 Old myocardial infarction: Secondary | ICD-10-CM | POA: Diagnosis not present

## 2021-10-09 DIAGNOSIS — I739 Peripheral vascular disease, unspecified: Secondary | ICD-10-CM | POA: Diagnosis not present

## 2021-10-09 DIAGNOSIS — I4891 Unspecified atrial fibrillation: Secondary | ICD-10-CM | POA: Diagnosis not present

## 2021-10-09 DIAGNOSIS — Z008 Encounter for other general examination: Secondary | ICD-10-CM | POA: Diagnosis not present

## 2021-10-09 DIAGNOSIS — D6869 Other thrombophilia: Secondary | ICD-10-CM | POA: Diagnosis not present

## 2021-10-09 DIAGNOSIS — I495 Sick sinus syndrome: Secondary | ICD-10-CM | POA: Diagnosis not present

## 2021-10-09 DIAGNOSIS — I110 Hypertensive heart disease with heart failure: Secondary | ICD-10-CM | POA: Diagnosis not present

## 2021-10-09 DIAGNOSIS — E039 Hypothyroidism, unspecified: Secondary | ICD-10-CM | POA: Diagnosis not present

## 2021-10-09 DIAGNOSIS — I2510 Atherosclerotic heart disease of native coronary artery without angina pectoris: Secondary | ICD-10-CM | POA: Diagnosis not present

## 2021-10-09 DIAGNOSIS — E785 Hyperlipidemia, unspecified: Secondary | ICD-10-CM | POA: Diagnosis not present

## 2021-10-09 DIAGNOSIS — E261 Secondary hyperaldosteronism: Secondary | ICD-10-CM | POA: Diagnosis not present

## 2021-10-09 DIAGNOSIS — R69 Illness, unspecified: Secondary | ICD-10-CM | POA: Diagnosis not present

## 2021-10-09 NOTE — Progress Notes (Deleted)
Psychiatric Initial Adult Assessment   Patient Identification: [name] MRN:  [id number] Date of Evaluation:  10/09/2021 Referral Source:  PCP - Dr. Ines Bloomer Sagard[place] Chief Complaint:  No chief complaint on file.  Visit Diagnosis: No diagnosis found.   Assessment:  [name] is a 81 y.o. y.o. female with a history of anxiety and depression who presents virtually to [place] at [facility] for initial evaluation of increased depression. Patient    Patient reports ***  Plan: - Celexa 40 mg -  - TSH WNL  History of Present Illness:     This is a 81 y.o. female complaining of lack of energy and feeling depressed for the past several weeks.  Has history of chronic depression on Celexa 40 mg daily.  Has been referred to psychiatrist in the past but never followed up.  Non-smoker but drinks alcohol almost every night. No other complaints or medical concerns today.  Associated Signs/Symptoms: Depression Symptoms:  {DEPRESSION SYMPTOMS:20000} (Hypo) Manic Symptoms:   Denies Anxiety Symptoms:  {BHH ANXIETY SYMPTOMS:22873} Psychotic Symptoms:   Denies PTSD Symptoms: {BHH PTSD SYMPTOMS:22875}  Past Psychiatric History: ***  Previous Psychotropic Medications: Yes   Substance Abuse History in the last 12 months:  Yes.    Consequences of Substance Abuse: {BHH CONSEQUENCES OF SUBSTANCE ABUSE:22880}  Past Medical History:  Past Medical History:  Diagnosis Date   ANXIETY 12/31/2009   Aortic insufficiency    a. mod by echo 2017.   CAD (coronary artery disease)    a. s/p CABGx3 and LAA clipping in 12/2014, NSTEMI 05/2015 s/p DES to native RCA and SVG-dRCA; occluded ramus-SVG was treated medically). c. neg nuc 10/2015 at [facility].   Chronic diastolic CHF (congestive heart failure) (HCC)    Chronic fatigue    CKD (chronic kidney disease), stage II    DEPRESSION 12/31/2009   [place] DISEASE, CERVICAL 12/31/2009   [place] DISEASE, LUMBAR 12/31/2009    DIVERTICULITIS, HX OF 12/31/2009   Essential hypertension    Gait abnormality 03/25/2017   GLUCOSE INTOLERANCE 12/31/2009   Habitual alcohol use    History of stroke    self reports " they say i have had some pin strokes"    Hypercalcemia    Hyperkalemia    Hyperlipidemia 12/31/2009   Hypothyroidism    MENOPAUSE, EARLY 12/31/2009   NSTEMI (non-ST elevated myocardial infarction) ([place]) 05/11/2015   Orthostatic hypotension    OSTEOARTHRITIS, HAND    Pacemaker 01/21/2012   MDT Adapta dual chamber   Paroxysmal atrial flutter ([place])    Persistent atrial fibrillation (HCC)    PVD (peripheral vascular disease), hx stents to bil SFAs 02/2010    Rash, skin     superficial raised red pencil point sized rash bilateral forearms; states " it started when i started the Plavix "    Ruptured left breast implant    Symptomatic sinus bradycardia 01/22/2012   Syncope    a. 11/2015 ? due to medications.   Syncope    reports on 05-10-17 " i passed out 2 months ago in the bathroom and broke some ribs"     Tachy-brady syndrome ([place])    a. s/p MDT PPM 2013.   Tricuspid regurgitation     Past Surgical History:  Procedure Laterality Date   ABDOMINAL AORTOGRAM W/LOWER EXTREMITY Bilateral 02/15/2017   Procedure: ABDOMINAL AORTOGRAM W/LOWER EXTREMITY;  Surgeon: Lorretta Harp, MD;  Location: [place] [facility];  Service: Cardiovascular;  Laterality: Bilateral;  bilat   ABDOMINAL  AORTOGRAM W/LOWER EXTREMITY N/A 10/06/2018   Procedure: ABDOMINAL AORTOGRAM W/LOWER EXTREMITY;  Surgeon: Lorretta Harp, MD;  Location: [place] [facility];  Service: Cardiovascular;  Laterality: N/A;   Ablation of typical atrial flutter (CTI line)  09/05/2016   Dr Antonieta Pert at [place]   Removed 01/2018   BREAST IMPLANT REMOVAL Bilateral 01/14/2018   Procedure: REMOVAL BILATERAL BREAST IMPLANTS;  Surgeon: Irene Limbo, MD;  Location: [place];  Service: Plastics;  Laterality:  Bilateral;   CAPSULECTOMY Bilateral 01/14/2018   Procedure: CAPSULECTOMY;  Surgeon: Irene Limbo, MD;  Location: [place];  Service: Plastics;  Laterality: Bilateral;   CARDIAC CATHETERIZATION N/A 01/01/2015   Procedure: Left Heart Cath and Coronary Angiography;  Surgeon: Jettie Booze, MD;  Location: [place] [facility];  Service: Cardiovascular;  Laterality: N/A;   CARDIAC CATHETERIZATION N/A 05/12/2015   Procedure: Left Heart Cath and Coronary Angiography;  Surgeon: Troy Sine, MD;  Location: [place] [facility] LAB;  Service: Cardiovascular;  Laterality: N/A;   CARDIAC CATHETERIZATION N/A 05/12/2015   Procedure: Coronary Stent Intervention;  Surgeon: Troy Sine, MD;  Location: [place] [facility];  Service: Cardiovascular;  Laterality: N/A;   CARDIOVERSION N/A 02/01/2015   Procedure: CARDIOVERSION;  Surgeon: Lelon Perla, MD;  Location: [facility] ENDOSCOPY;  Service: Cardiovascular;  Laterality: N/A;   CARDIOVERSION N/A 08/30/2015   Procedure: CARDIOVERSION;  Surgeon: Sanda Klein, MD;  Location: [place] ENDOSCOPY;  Service: Cardiovascular;  Laterality: N/A;   CARPAL TUNNEL RELEASE  2008   "right hand/thumb; carpal tunnel repair; got rid of arthritis" (01/21/2012)   CLIPPING OF ATRIAL APPENDAGE N/A 01/04/2015   Procedure: CLIPPING OF ATRIAL APPENDAGE;  Surgeon: Grace Isaac, MD;  Location: [place];  Service: Open Heart Surgery;  Laterality: N/A;   COLONOSCOPY N/A 12/21/2015   Procedure: COLONOSCOPY;  Surgeon: Milus Banister, MD;  Location: [facility] ENDOSCOPY;  Service: Endoscopy;  Laterality: N/A;   CORONARY ARTERY BYPASS GRAFT N/A 01/04/2015   Procedure: CORONARY ARTERY BYPASS GRAFTING (CABG) x 3 using left internal mammory artery and greater saphenous vein right leg harvested endoscopically.;  Surgeon: Grace Isaac, MD;  LIMA-LAD, SVG-RI, SVG-PDA   CORONARY STENT INTERVENTION N/A 11/07/2020   Procedure: CORONARY STENT INTERVENTION;  Surgeon: Early Osmond, MD;   Location: [place] CV LAB;  Service: Cardiovascular;  Laterality: N/A;   ESOPHAGOGASTRODUODENOSCOPY (EGD) WITH PROPOFOL N/A 12/18/2015   Procedure: ESOPHAGOGASTRODUODENOSCOPY (EGD) WITH PROPOFOL;  Surgeon: Milus Banister, MD;  Location: [facility] ENDOSCOPY;  Service: Endoscopy;  Laterality: N/A;   FACELIFT, LOWER 2/3  1995   "mini" (01/21/2012)   GIVENS CAPSULE STUDY N/A 12/21/2015   Procedure: GIVENS CAPSULE STUDY;  Surgeon: Milus Banister, MD;  Location: [facility] ENDOSCOPY;  Service: Endoscopy;  Laterality: N/A;   LEFT HEART CATH AND CORS/GRAFTS ANGIOGRAPHY N/A 11/07/2020   Procedure: LEFT HEART CATH AND CORS/GRAFTS ANGIOGRAPHY;  Surgeon: Early Osmond, MD;  Location: [place] [facility];  Service: Cardiovascular;  Laterality: N/A;   Lower Arterial Examination  10/28/2011   R. SFA stent mild-moderate mixed density plaque with elevated velocities consistent with 50% diameter reduction. L. SFA stent moderate mixed denisty plaque at mid to distal level consistent with 50-69% diameter reduction.   LOWER EXTREMITY ANGIOGRAPHY N/A 02/15/2017   Procedure: LOWER EXTREMITY ANGIOGRAPHY;  Surgeon: Lorretta Harp, MD;  Location: [place] CV LAB;  Service: Cardiovascular;  Laterality: N/A;   OOPHORECTOMY  ~1979   PARATHYROIDECTOMY  N/A 05/13/2017   Procedure: PARATHYROIDECTOMY;  Surgeon: Armandina Gemma, MD;  Location: [facility];  Service: General;  Laterality: N/A;   PARTIAL COLECTOMY  2010   PERIPHERAL ARTERIAL STENT GRAFT  2012; 2012   "LLE; RLE" (01/21/2012)   PERIPHERAL VASCULAR BALLOON ANGIOPLASTY Right 02/15/2017   Procedure: PERIPHERAL VASCULAR BALLOON ANGIOPLASTY;  Surgeon: Lorretta Harp, MD;  Location: [place] [facility];  Service: Cardiovascular;  Laterality: Right;  SFA     PERIPHERAL VASCULAR INTERVENTION Right 10/06/2018   Procedure: PERIPHERAL VASCULAR INTERVENTION;  Surgeon: Lorretta Harp, MD;  Location: [place] CV LAB;  Service: Cardiovascular;  Laterality: Right;   PERMANENT PACEMAKER  INSERTION N/A 01/21/2012   Medtronic Adapta L implanted by Dr Sallyanne Kuster for tachy/brady syndrome   POSTERIOR CERVICAL LAMINECTOMY  1985   TEE WITHOUT CARDIOVERSION N/A 01/04/2015   Procedure: TRANSESOPHAGEAL ECHOCARDIOGRAM (TEE);  Surgeon: Grace Isaac, MD;  Location: [place];  Service: Open Heart Surgery;  Laterality: N/A;   TEE WITHOUT CARDIOVERSION N/A 02/01/2015   Procedure: TRANSESOPHAGEAL ECHOCARDIOGRAM (TEE);  Surgeon: Lelon Perla, MD;  [facility] ENDOSCOPY;  Service: Cardiovascular;  Laterality: N/A;   TEE WITHOUT CARDIOVERSION N/A 08/30/2015   Procedure: TRANSESOPHAGEAL ECHOCARDIOGRAM (TEE);  Surgeon: Sanda Klein, MD;  Location[facility];  Service: Cardiovascular;  Laterality: N/A;   VAGINAL HYSTERECTOMY  1975    Family Psychiatric History: ***  Family History:  Family History  Problem Relation Age of Onset   Heart disease Mother    Hypertension Mother    Stroke Mother    Stroke Father    Heart disease Father    Heart disease Brother    Hypertension Brother        2 brothers   CVA Brother        2 brothers   Heart attack Brother        2 brothers    Social History:   Social History   Socioeconomic History   Marital status: Widowed    Spouse name: Not on file   Number of children: 3   Years of education: Not on file   Highest education level: Not on file  Occupational History   Occupation: Retired Teacher, 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   Retired.   Social Determinants of Health   Financial Resource Strain: Not on file  Food Insecurity: Not on file  Transportation Needs: Not on file  Physical Activity: Not on file  Stress: Not on file  Social Connections: Not on file    Additional Social History: ***  Allergies:   Allergies  Allergen Reactions   Brilinta [Ticagrelor] Shortness Of Breath   Atorvastatin Other (See Comments)    Possible cause of fatigue/malaise   Clonidine Derivatives Other (See Comments)    Lowers heart rate   Crestor [Rosuvastatin Calcium] Other (See Comments)  Joint/muscle aches   Exforge [Amlodipine Besylate-Valsartan] Itching and Rash   Spironolactone Other (See Comments)    Contraindicated with history of hyperkalemia    Metabolic Disorder Labs: Lab Results  Component Value Date   HGBA1C 6.1 10/07/2021   MPG 111 12/18/2015   MPG 137 01/31/2015   No results found for: "PROLACTIN" Lab Results  Component Value Date   CHOL 137 05/28/2021   TRIG 239.0 (H) 05/28/2021   HDL 55.30 05/28/2021   CHOLHDL 2 05/28/2021   VLDL 47.8 (H) 05/28/2021   LDLCALC 84 06/11/2020   LDLCALC 140 (H) 12/26/2018   Lab Results  Component Value Date   TSH 1.17 10/07/2021    Therapeutic Level Labs: No results found for: "LITHIUM" No results found for: "CBMZ" No results found for: "VALPROATE"  Current Medications: Current Outpatient Medications  Medication Sig Dispense Refill   amLODipine (NORVASC) 5 MG tablet TAKE 1 TABLET (5 MG TOTAL) BY MOUTH IN THE MORNING AND AT BEDTIME 180 tablet 1   ascorbic acid (VITAMIN C) 250 MG CHEW Chew 750 mg by mouth daily.     aspirin EC 81 MG tablet Take 81 mg by mouth daily.      Biotin 10000 MCG TABS Take 10,000 mcg by mouth daily.     carvedilol (COREG) 6.25 MG tablet TAKE 1 TABLET BY MOUTH 2 TIMES DAILY WITH A MEAL. 180 tablet 1   Cholecalciferol (VITAMIN D) 50 MCG (2000 UT) tablet Take 2,000 Units by mouth daily.     citalopram (CELEXA) 40 MG tablet TAKE 1  TABLET BY MOUTH EVERY DAY 90 tablet 1   clopidogrel (PLAVIX) 75 MG tablet TAKE 1 TABLET BY MOUTH DAILY WITH BREAKFAST. 90 tablet 3   Cyanocobalamin (B-12) 5000 MCG CAPS Take 10,000 mcg by mouth daily.     dofetilide (TIKOSYN) 125 MCG capsule TAKE 1 CAPSULE BY MOUTH EVERY 12 HOURS. (GENERIC FOR TIKOSYN) 60 capsule 0   furosemide (LASIX) 40 MG tablet TAKE 1 TABLET BY MOUTH EVERY OTHER DAY (Patient taking differently: 20 mg as needed (as needed for edema).) 20 tablet 2   furosemide (LASIX) 80 MG tablet Take 1 tablet (80 mg total) by mouth daily. 90 tablet 3   levothyroxine (SYNTHROID) 25 MCG tablet Take 1 tablet (25 mcg total) by mouth daily before breakfast. 30 tablet 3   loteprednol (LOTEMAX) 0.5 % ophthalmic suspension Place 2 drops into both eyes See admin instructions. Instill 2 drops into both eyes 2 times a day twice weekly     Magnesium 200 MG TABS Take 200 mg by mouth daily.     methocarbamol (ROBAXIN) 500 MG tablet Take 1 tablet (500 mg total) by mouth 2 (two) times daily. 20 tablet 1   Multiple Vitamin (MULTIVITAMIN WITH MINERALS) TABS tablet Take 1 tablet by mouth 2 (two) times daily.     Multiple Vitamins-Minerals (ZINC PO) Take 150 mg by mouth daily.     nitroGLYCERIN (NITROSTAT) 0.4 MG SL tablet Place 1 tablet (0.4 mg total) under the tongue every 5 (five) minutes x 3 doses as needed for chest pain. 25 tablet 2   oxyCODONE-acetaminophen (PERCOCET/ROXICET) 5-325 MG tablet Take 1-2 tablets by mouth every 6 (six) hours as needed for severe pain. 15 tablet 0   rosuvastatin (CRESTOR) 40 MG tablet Take 1 tablet (40 mg total) by mouth daily. 90 tablet 3   traMADol (ULTRAM) 50 MG tablet Take 1 tablet by mouth every 6-8 hours as needed for severe pain. 20 tablet 1   VITAMIN  D-VITAMIN K PO Take 1 capsule by mouth daily.     No current facility-administered medications for this visit.    Psychiatric Specialty Exam: Review of Systems limited due to virtual visit  There were no vitals taken for  this visit.There is no height or weight on file to calculate BMI.  General Appearance: {Appearance:22683}  Eye Contact:  {BHH EYE CONTACT:22684}  Speech:  {Speech:22685}  Volume:  {Volume (PAA):22686}  Mood:  {BHH MOOD:22306}  Affect:  {Affect (PAA):22687}  Thought Process:  {Thought Process (PAA):22688}  Orientation:  {BHH ORIENTATION (PAA):22689}  Thought Content:  {Thought Content:22690}  Suicidal Thoughts:  {ST/HT (PAA):22692}  Homicidal Thoughts:  {ST/HT (PAA):22692}  Memory:  {BHH MEMORY:22881}  Judgement:  {Judgement (PAA):22694}  Insight:  {Insight (PAA):22695}  Psychomotor Activity:  {Psychomotor (PAA):22696}  Concentration:  {Concentration:21399}  Recall:  {BHH GOOD/FAIR/POOR:22877}  Fund of Knowledge:{BHH GOOD/FAIR/POOR:22877}  Language: {BHH GOOD/FAIR/POOR:22877}  Akathisia:  No    AIMS (if indicated):  not done  Assets:  {Assets (PAA):22698}  ADL's:  {BHH XAJ'O:87867}  Cognition: {chl bhh cognition:304700322}  Sleep:  {BHH GOOD/FAIR/POOR:22877}   Screenings: Camera operator Row Office Visit from 10/07/2021 in [place] at [facility] Visit from 05/28/2021 in [place] at [facility] Visit from 04/07/2021 in [place] at [facility] Visit from 05/30/2020 in [place] at [facility] Visit from 01/01/2020 in [facility] at [facility] Total Score 6 1 0 0 1  PHQ-9 Total Score 13 -- -- -- 3      Flowsheet Row ED from 05/12/2021 in [facility] Admission (Discharged) from 11/07/2020 in [place] [facility]  C-SSRS RISK CATEGORY No Risk No Risk        Collaboration of Care: Medication Management AEB ***  Patient/Guardian was advised Release of Information must be obtained prior to any record release in order to collaborate their care with an outside provider. Patient/Guardian was advised if they have not already done so to contact the registration  department to sign all necessary forms in order for [place] to release information regarding their care.   Consent: Patient/Guardian gives verbal consent for treatment and assignment of benefits for services provided during this visit. Patient/Guardian expressed understanding and agreed to proceed.   Vista Mink, MD 8/31/20231:45 PM    Virtual Visit via Video Note  I connected withNAME@ on 10/09/21 at  2:00 PM EDT by a video enabled telemedicine application and verified that I am speaking with the correct person using two identifiers.  Location: Patient: Home Provider: Home office   I discussed the limitations of evaluation and management by telemedicine and the availability of in person appointments. The patient expressed understanding and agreed to proceed.   I discussed the assessment and treatment plan with the patient. The patient was provided an opportunity to ask questions and all were answered. The patient agreed with the plan and demonstrated an understanding of the instructions.   The patient was advised to call back or seek an in-person evaluation if the symptoms worsen or if the condition fails to improve as anticipated.  I provided *** minutes of non-face-to-face time during this encounter.   Vista Mink, MD

## 2021-10-16 ENCOUNTER — Encounter (HOSPITAL_COMMUNITY): Payer: Self-pay

## 2021-10-16 ENCOUNTER — Encounter (HOSPITAL_COMMUNITY): Payer: Medicare HMO | Admitting: Psychiatry

## 2021-10-16 ENCOUNTER — Encounter: Payer: Self-pay | Admitting: Emergency Medicine

## 2021-10-16 NOTE — Progress Notes (Signed)
This encounter was created in error - please disregard.

## 2021-11-10 ENCOUNTER — Encounter (HOSPITAL_COMMUNITY): Payer: Self-pay | Admitting: Psychiatry

## 2021-11-10 ENCOUNTER — Ambulatory Visit (HOSPITAL_COMMUNITY): Payer: Medicare HMO | Admitting: Psychiatry

## 2021-11-10 DIAGNOSIS — R69 Illness, unspecified: Secondary | ICD-10-CM | POA: Diagnosis not present

## 2021-11-10 DIAGNOSIS — F331 Major depressive disorder, recurrent, moderate: Secondary | ICD-10-CM

## 2021-11-10 MED ORDER — BUPROPION HCL 75 MG PO TABS: 75.0000 mg | ORAL_TABLET | Freq: Every day | ORAL | 1 refills | Status: DC

## 2021-11-10 MED ORDER — ESCITALOPRAM OXALATE 10 MG PO TABS: 10.0000 mg | ORAL_TABLET | Freq: Every day | ORAL | 1 refills | Status: DC

## 2021-11-10 NOTE — Progress Notes (Signed)
Psychiatric Initial Adult Assessment   Patient Identification: [name] MRN:  [id number] Date of Evaluation:  11/10/2021 Referral Source: PCP Chief Complaint:  No chief complaint on file.  Visit Diagnosis:    ICD-10-CM   1. Moderate episode of recurrent major depressive disorder (HCC)  F33.1 buPROPion (WELLBUTRIN) 75 MG tablet    escitalopram (LEXAPRO) 10 MG tablet       Assessment:  [name] is a 81 y.o. y.o. female with a history of MDD who presents in person to [place] [facility] for initial evaluation on 11/10/21.    Patient reports neurovegetative symptoms of depression including fatigue, amotivation, anhedonia, decreased appetite, decreased concentration, and passive SI.  She denies any intent or plan.  Patient's symptoms have been present for the last 2 years or so though there was a further decline following the passing of her close friend last year.  At this time patient meets criteria for MDD and would benefit from medication adjustment and behavioral activation.  She could also benefit from therapy however declined at this time.  We will cross taper off of Celexa and onto Lexapro due to inadequate response on Celexa and contraindication for dosing over 20 mg inpatients over the age of 76 due to cardiac effects.  We will also start Wellbutrin to augment mood.  Plan: - Taper Celexa to 20 mg QD - Start Lexapro 10 mg QD - Start bupropion 75 mg QD - TSH, CBC, A1C WNL - Church support groups - Declined therapy  - Follow up in a month  History of Present Illness: Patient presents reporting that she has been severely depressed for the last couple of years now where she has had fatigue, amotivation, anhedonia, decreased appetite, decreased concentration, and passive SI.  She denies any intent or plan.  [name] reports that does not feel like she is a burden on anyone but worries of reaching a point.  She does not remember any particular  trigger that correlated with the worsening of her mood but notes that it does seem to have gotten a bit worse since her friend passed last year.  [name] notes that she had met this friend after her husband passed and the 2 of them had spent the last 6 years together.  They would go out to eat, go visit family, and go to shows together which she enjoyed.  Now she still does visit the family including his which she does enjoy but does not get the same enjoyment out of the other activities.  [name] does go to church where she pays the piano/organ and enjoys this social connection there.  We discussed treatment options including medication, therapy, and behavioral activation.  Patient expressed that she did not want any medications that would make her groggy.  We discussed how Celexa is recommended to not go above 20 mg in patients over the age of 74 and would recommend cross tapering off of that and going on to Lexapro which she was open to.  Also discussed Wellbutrin for the management of depression and explained the risk and benefits.  Regarding therapy that he notes that she had tried in the past and was not a fan.  She was more interested in connecting with some of the support groups that are available at her church.  We also discussed the benefits of getting involved in volunteer groups or other activities that give her a sense of purpose which she feels she no longer has.  She agrees  this could be a good idea and is going to look into various groups to see if any strike her interest.  Associated Signs/Symptoms: Depression Symptoms:  depressed mood, fatigue, difficulty concentrating, suicidal thoughts without plan, anxiety, loss of energy/fatigue, (Hypo) Manic Symptoms:   Denies Anxiety Symptoms:  Panic Symptoms, Psychotic Symptoms:   Denies PTSD Symptoms: Had a traumatic exposure:  Emotional and physical  Past Psychiatric History: She reports that she has been dealing with depression since 1980.   At that time she overdosed on a benzodiazepine and was hospitalized at [facility].  She believes she was started on medications after that and has been on medications a few times since but she cannot recall what medications those were.  He also notes having tried therapy once in the past and not finding it a good fit.  Denies any substance use other than alcohol and reports drinking 3-4 drinks a week.  Previous Psychotropic Medications: Yes   Substance Abuse History in the last 12 months:  No.  Consequences of Substance Abuse: NA  Past Medical History:  Past Medical History:  Diagnosis Date   ANXIETY 12/31/2009   Aortic insufficiency    a. mod by echo 2017.   CAD (coronary artery disease)    a. s/p CABGx3 and LAA clipping in 12/2014, NSTEMI 05/2015 s/p DES to native RCA and SVG-dRCA; occluded ramus-SVG was treated medically). c. neg nuc 10/2015 at [facility].   Chronic diastolic CHF (congestive heart failure) (HCC)    Chronic fatigue    CKD (chronic kidney disease), stage II    DEPRESSION 12/31/2009   [place] DISEASE, CERVICAL 12/31/2009   [place] DISEASE, LUMBAR 12/31/2009   DIVERTICULITIS, HX OF 12/31/2009   Essential hypertension    Gait abnormality 03/25/2017   GLUCOSE INTOLERANCE 12/31/2009   Habitual alcohol use    History of stroke    self reports " they say i have had some pin strokes"    Hypercalcemia    Hyperkalemia    Hyperlipidemia 12/31/2009   Hypothyroidism    MENOPAUSE, EARLY 12/31/2009   NSTEMI (non-ST elevated myocardial infarction) ([place]) 05/11/2015   Orthostatic hypotension    OSTEOARTHRITIS, HAND    Pacemaker 01/21/2012   MDT Adapta dual chamber   Paroxysmal atrial flutter ([place])    Persistent atrial fibrillation (HCC)    PVD (peripheral vascular disease), hx stents to bil SFAs 02/2010    Rash, skin     superficial raised red pencil point sized rash bilateral forearms; states " it started when i started the Plavix "    Ruptured left breast implant    Symptomatic sinus  bradycardia 01/22/2012   Syncope    a. 11/2015 ? due to medications.   Syncope    reports on 05-10-17 " i passed out 2 months ago in the bathroom and broke some ribs"     Tachy-brady syndrome ([place])    a. s/p MDT PPM 2013.   Tricuspid regurgitation     Past Surgical History:  Procedure Laterality Date   ABDOMINAL AORTOGRAM W/LOWER EXTREMITY Bilateral 02/15/2017   Procedure: ABDOMINAL AORTOGRAM W/LOWER EXTREMITY;  Surgeon: Lorretta Harp, MD;  Location: [place] [facility];  Service: Cardiovascular;  Laterality: Bilateral;  bilat   ABDOMINAL AORTOGRAM W/LOWER EXTREMITY N/A 10/06/2018   Procedure: ABDOMINAL AORTOGRAM W/LOWER EXTREMITY;  Surgeon: Lorretta Harp, MD;  Location: [place] [facility];  Service: Cardiovascular;  Laterality: N/A;   Ablation of typical atrial flutter (CTI line)  09/05/2016   [facility] Antonieta Pert at [facility]  AUGMENTATION MAMMAPLASTY  1983   Removed 01/2018   BREAST IMPLANT REMOVAL Bilateral 01/14/2018   Procedure: REMOVAL BILATERAL BREAST IMPLANTS;  Surgeon: Irene Limbo, MD;  Location: [place];  Service: Plastics;  Laterality: Bilateral;   CAPSULECTOMY Bilateral 01/14/2018   Procedure: CAPSULECTOMY;  Surgeon: Irene Limbo, MD;  Location: [place];  Service: Plastics;  Laterality: Bilateral;   CARDIAC CATHETERIZATION N/A 01/01/2015   Procedure: Left Heart Cath and Coronary Angiography;  Surgeon: Jettie Booze, MD;  Location: [place] [facility];  Service: Cardiovascular;  Laterality: N/A;   CARDIAC CATHETERIZATION N/A 05/12/2015   Procedure: Left Heart Cath and Coronary Angiography;  Surgeon: Troy Sine, MD;  Location: [place] [facility];  Service: Cardiovascular;  Laterality: N/A;   CARDIAC CATHETERIZATION N/A 05/12/2015   Procedure: Coronary Stent Intervention;  Surgeon: Troy Sine, MD;  Location: [place] [facility];  Service: Cardiovascular;  Laterality: N/A;   CARDIOVERSION N/A 02/01/2015   Procedure: CARDIOVERSION;   Surgeon: Lelon Perla, MD;  Location: [facility];  Service: Cardiovascular;  Laterality: N/A;   CARDIOVERSION N/A 08/30/2015   Procedure: CARDIOVERSION;  Surgeon: Sanda Klein, MD;  Location: [place] [facility];  Service: Cardiovascular;  Laterality: N/A;   CARPAL TUNNEL RELEASE  2008   "right hand/thumb; carpal tunnel repair; got rid of arthritis" (01/21/2012)   CLIPPING OF ATRIAL APPENDAGE N/A 01/04/2015   Procedure: CLIPPING OF ATRIAL APPENDAGE;  Surgeon: Grace Isaac, MD;  Location: [place];  Service: Open Heart Surgery;  Laterality: N/A;   COLONOSCOPY N/A 12/21/2015   Procedure: COLONOSCOPY;  Surgeon: Milus Banister, MD;  Location: [facility];  Service: Endoscopy;  Laterality: N/A;   CORONARY ARTERY BYPASS GRAFT N/A 01/04/2015   Procedure: CORONARY ARTERY BYPASS GRAFTING (CABG) x 3 using left internal mammory artery and greater saphenous vein right leg harvested endoscopically.;  Surgeon: Grace Isaac, MD;  LIMA-LAD, SVG-RI, SVG-PDA   CORONARY STENT INTERVENTION N/A 11/07/2020   Procedure: CORONARY STENT INTERVENTION;  Surgeon: Early Osmond, MD;  Location: [place] [facility];  Service: Cardiovascular;  Laterality: N/A;   ESOPHAGOGASTRODUODENOSCOPY (EGD) WITH PROPOFOL N/A 12/18/2015   Procedure: ESOPHAGOGASTRODUODENOSCOPY (EGD) WITH PROPOFOL;  Surgeon: Milus Banister, MD;  Location: [facility];  Service: Endoscopy;  Laterality: N/A;   FACELIFT, LOWER 2/3  1995   "mini" (01/21/2012)   GIVENS CAPSULE STUDY N/A 12/21/2015   Procedure: GIVENS CAPSULE STUDY;  Surgeon: Milus Banister, MD;  Location: [facility];  Service: Endoscopy;  Laterality: N/A;   LEFT HEART CATH AND CORS/GRAFTS ANGIOGRAPHY N/A 11/07/2020   Procedure: LEFT HEART CATH AND CORS/GRAFTS ANGIOGRAPHY;  Surgeon: Early Osmond, MD;  Location: [place] [facility];  Service: Cardiovascular;  Laterality: N/A;   Lower Arterial Examination  10/28/2011   R. SFA stent mild-moderate mixed density plaque with elevated  velocities consistent with 50% diameter reduction. L. SFA stent moderate mixed denisty plaque at mid to distal level consistent with 50-69% diameter reduction.   LOWER EXTREMITY ANGIOGRAPHY N/A 02/15/2017   Procedure: LOWER EXTREMITY ANGIOGRAPHY;  Surgeon: Lorretta Harp, MD;  Location: [place] [facility];  Service: Cardiovascular;  Laterality: N/A;   OOPHORECTOMY  ~1979   PARATHYROIDECTOMY N/A 05/13/2017   Procedure: PARATHYROIDECTOMY;  Surgeon: Armandina Gemma, MD;  Location: [facility];  Service: General;  Laterality: N/A;   PARTIAL COLECTOMY  2010   PERIPHERAL ARTERIAL STENT GRAFT  2012; 2012   "LLE; RLE" (01/21/2012)   PERIPHERAL VASCULAR BALLOON ANGIOPLASTY Right 02/15/2017  Procedure: PERIPHERAL VASCULAR BALLOON ANGIOPLASTY;  Surgeon: Lorretta Harp, MD;  Location: [place] [facility];  Service: Cardiovascular;  Laterality: Right;  SFA     PERIPHERAL VASCULAR INTERVENTION Right 10/06/2018   Procedure: PERIPHERAL VASCULAR INTERVENTION;  Surgeon: Lorretta Harp, MD;  Location: [place] [facility];  Service: Cardiovascular;  Laterality: Right;   PERMANENT PACEMAKER INSERTION N/A 01/21/2012   Medtronic Adapta L implanted by Dr Sallyanne Kuster for tachy/brady syndrome   POSTERIOR CERVICAL LAMINECTOMY  1985   TEE WITHOUT CARDIOVERSION N/A 01/04/2015   Procedure: TRANSESOPHAGEAL ECHOCARDIOGRAM (TEE);  Surgeon: Grace Isaac, MD;  Location: [place];  Service: Open Heart Surgery;  Laterality: N/A;   TEE WITHOUT CARDIOVERSION N/A 02/01/2015   Procedure: TRANSESOPHAGEAL ECHOCARDIOGRAM (TEE);  Surgeon: Lelon Perla, MD;  Location: [facility] ENDOSCOPY;  Service: Cardiovascular;  Laterality: N/A;   TEE WITHOUT CARDIOVERSION N/A 08/30/2015   Procedure: TRANSESOPHAGEAL ECHOCARDIOGRAM (TEE);  Surgeon: Sanda Klein, MD;  Location: [facility] END[facility]COPY;  Service: Cardiovascular;  Laterality: N/A;   VAGINAL HYSTERECTOMY  1975    Family Psychiatric History: Denies  Family History:  Family History  Problem Relation Age  of Onset   Heart disease Mother    Hypertension Mother    Stroke Mother    Stroke Father    Heart disease Father    Heart disease Brother    Hypertension Brother        2 brothers   CVA Brother        2 brothers   Heart attack Brother        2 brothers    Social History:   Social History   Socioeconomic History   Marital status: Widowed    Spouse name: Not on file   Number of children: 3   Years of education: Not on file   Highest education level: Not on file  Occupational History   Occupation: Retired Teacher, 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  Retired.   Social Determinants of Health   Financial Resource Strain: Not on file  Food Insecurity: Not on file  Transportation Needs: Not on file  Physical Activity: Not on file  Stress: Not on file  Social Connections: Not on file    Additional Social History: Patient was married for 50+ years before her husband passed.  She describes her relationship as rocky.  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  She has 3 children 2 sons and 1 daughter.  She sees the sons about once a month and the daughter every week.  [name] also has a number of grandchildren.  She made a friend who became her partner after the passing of her husband and she became close with his family as well he has 8 kids of whom some are younger.  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  Allergies:    Allergies  Allergen Reactions   Brilinta [Ticagrelor] Shortness Of Breath   Atorvastatin Other (See Comments)    Possible cause of fatigue/malaise   Clonidine Derivatives Other (See Comments)    Lowers heart rate   Crestor [Rosuvastatin Calcium] Other (See Comments)    Joint/muscle aches   Exforge [Amlodipine Besylate-Valsartan] Itching and Rash   Spironolactone Other (See Comments)    Contraindicated with history of hyperkalemia    Metabolic Disorder Labs: Lab Results  Component Value Date   HGBA1C 6.1 10/07/2021   MPG 111 12/18/2015   MPG 137 01/31/2015   No results found for: "PROLACTIN" Lab Results  Component Value Date   CHOL 137 05/28/2021   TRIG 239.0 (H) 05/28/2021   HDL 55.30 05/28/2021   CHOLHDL 2 05/28/2021   VLDL 47.8 (H) 05/28/2021   LDLCALC 84 06/11/2020   LDLCALC 140 (H) 12/26/2018   Lab Results  Component Value Date   TSH 1.17 10/07/2021    Therapeutic Level Labs: No results found for: "LITHIUM" No results found for: "CBMZ" No results found for: "VALPROATE"  Current Medications: Current Outpatient Medications  Medication Sig Dispense Refill   buPROPion (WELLBUTRIN) 75 MG tablet Take 1 tablet (75 mg total) by mouth daily. 30 tablet 1   escitalopram (LEXAPRO) 10 MG tablet Take 1 tablet (10 mg total) by mouth daily. 30 tablet 1   amLODipine (NORVASC) 5 MG tablet TAKE 1 TABLET (5 MG TOTAL) BY MOUTH IN THE MORNING AND AT BEDTIME 180 tablet 1   ascorbic acid (VITAMIN C) 250 MG CHEW Chew 750 mg by mouth daily.     aspirin EC 81 MG tablet Take 81 mg by mouth daily.      Biotin 10000 MCG TABS Take 10,000 mcg by mouth daily.     carvedilol (COREG) 6.25 MG tablet TAKE 1 TABLET BY MOUTH 2 TIMES DAILY WITH A MEAL. 180 tablet 1   Cholecalciferol (VITAMIN D) 50 MCG (2000 UT) tablet Take 2,000 Units by mouth daily.     citalopram (CELEXA) 40 MG tablet TAKE 1 TABLET BY MOUTH EVERY DAY 90 tablet 1   clopidogrel (PLAVIX) 75 MG tablet TAKE 1 TABLET BY MOUTH DAILY  WITH BREAKFAST. 90 tablet 3   Cyanocobalamin (B-12) 5000 MCG CAPS Take 10,000 mcg by mouth daily.     dofetilide (TIKOSYN) 125 MCG capsule TAKE 1 CAPSULE BY MOUTH EVERY 12 HOURS. (GENERIC FOR TIKOSYN) 60 capsule 0   furosemide (LASIX) 40 MG tablet TAKE 1 TABLET BY MOUTH EVERY OTHER DAY (Patient taking differently: 20 mg as needed (as needed for edema).) 20 tablet 2   furosemide (LASIX) 80 MG tablet Take 1 tablet (80 mg total) by mouth daily. 90 tablet 3   loteprednol (LOTEMAX) 0.5 % ophthalmic suspension Place 2 drops into both eyes See admin instructions. Instill 2 drops into both eyes 2 times a day twice weekly     Magnesium 200 MG TABS Take 200 mg by mouth daily.     methocarbamol (ROBAXIN) 500 MG tablet Take 1 tablet (500 mg total) by mouth 2 (two) times daily. 20 tablet 1   Multiple Vitamin (MULTIVITAMIN WITH MINERALS) TABS tablet Take 1 tablet by mouth 2 (two) times daily.     Multiple Vitamins-Minerals (ZINC PO) Take 150 mg by mouth daily.     nitroGLYCERIN (NITROSTAT) 0.4 MG SL tablet Place 1 tablet (0.4 mg total) under the tongue every 5 (five) minutes x 3  doses as needed for chest pain. 25 tablet 2   oxyCODONE-acetaminophen (PERCOCET/ROXICET) 5-325 MG tablet Take 1-2 tablets by mouth every 6 (six) hours as needed for severe pain. 15 tablet 0   rosuvastatin (CRESTOR) 40 MG tablet Take 1 tablet (40 mg total) by mouth daily. 90 tablet 3   traMADol (ULTRAM) 50 MG tablet Take 1 tablet by mouth every 6-8 hours as needed for severe pain. 20 tablet 1   VITAMIN D-VITAMIN K PO Take 1 capsule by mouth daily.     No current facility-administered medications for this visit.    Musculoskeletal: Strength & Muscle Tone: within normal limits Gait & Station: normal Patient leans: N/A  Psychiatric Specialty Exam: Review of Systems  There were no vitals taken for this visit.There is no height or weight on file to calculate BMI.  General Appearance: Neat and Well Groomed  Eye Contact:  Fair   Speech:  Clear and Coherent and Normal Rate  Volume:  Normal  Mood:  Anxious and Depressed  Affect:  Congruent  Thought Process:  Goal Directed  Orientation:  Full (Time, Place, and Person)  Thought Content:  Logical  Suicidal Thoughts:  Yes.  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  ADL's:  Intact  Cognition: WNL  Sleep:  Good   Screenings: PHQ2-9    [place] Office Visit from 11/10/2021 in [facility] Visit from 10/07/2021 in [place] at [place] Visit from 05/28/2021 in [place] at [facility] Visit from 04/07/2021 in [place] at [facility] from 05/30/2020 in [place] at [facility]  PHQ-2 Total Score '4 6 1 ' 0 0  PHQ-9 Total Score 11 13 -- -- --      [place] Visit from 11/10/2021 in [facility] from 05/12/2021 in [facility] Admission (Discharged) from 11/07/2020 in [facility]  C-SSRS RISK CATEGORY Error: Q3, 4, or 5 should not be populated when Q2 is No No Risk No Risk        Collaboration of Care: Medication Management AEB medication prescription  Patient/Guardian was advised Release of Information must be obtained prior to any record release in order to collaborate their care with an outside provider. Patient/Guardian was advised if they have not already done so to contact the registration department to sign all necessary forms in order for [place] to release information regarding their care.   Consent: Patient/Guardian gives verbal consent for treatment and  assignment of benefits for services provided during this visit. Patient/Guardian expressed understanding and agreed to proceed.   Vista Mink, MD 10/2/20234:26 PM

## 2021-11-11 ENCOUNTER — Ambulatory Visit (INDEPENDENT_AMBULATORY_CARE_PROVIDER_SITE_OTHER): Payer: Medicare HMO

## 2021-11-11 DIAGNOSIS — I495 Sick sinus syndrome: Secondary | ICD-10-CM

## 2021-11-12 ENCOUNTER — Encounter: Payer: Self-pay | Admitting: Cardiovascular Disease

## 2021-11-12 LAB — CUP PACEART REMOTE DEVICE CHECK
Battery Impedance: 1692 Ohm
Battery Remaining Longevity: 37 mo
Battery Voltage: 2.76 V
Brady Statistic AP VP Percent: 0 %
Brady Statistic AP VS Percent: 100 %
Brady Statistic AS VP Percent: 0 %
Brady Statistic AS VS Percent: 0 %
Date Time Interrogation Session: 20231003143343
Implantable Lead Implant Date: 20131212
Implantable Lead Implant Date: 20131212
Implantable Lead Location: 753859
Implantable Lead Location: 753860
Implantable Lead Model: 5076
Implantable Lead Model: 5076
Implantable Pulse Generator Implant Date: 20131212
Lead Channel Impedance Value: 415 Ohm
Lead Channel Impedance Value: 478 Ohm
Lead Channel Pacing Threshold Amplitude: 0.75 V
Lead Channel Pacing Threshold Amplitude: 1 V
Lead Channel Pacing Threshold Pulse Width: 0.4 ms
Lead Channel Pacing Threshold Pulse Width: 0.4 ms
Lead Channel Setting Pacing Amplitude: 2 V
Lead Channel Setting Pacing Amplitude: 2.5 V
Lead Channel Setting Pacing Pulse Width: 0.4 ms
Lead Channel Setting Sensing Sensitivity: 4 mV

## 2021-11-13 ENCOUNTER — Telehealth: Payer: Self-pay

## 2021-11-13 NOTE — Telephone Encounter (Signed)
Called patient to see if she would be interested in scheduling AWV, she declined.

## 2021-11-13 NOTE — Telephone Encounter (Signed)
Only interaction I see is that escitalopram can potentially prolong QT and she is on Tikosyn.   Not sure if you would not worry, check EKG or tell her to avoid.    [name]

## 2021-11-17 ENCOUNTER — Ambulatory Visit: Payer: Medicare HMO | Attending: Cardiovascular Disease | Admitting: Cardiovascular Disease

## 2021-11-17 ENCOUNTER — Encounter: Payer: Self-pay | Admitting: Cardiovascular Disease

## 2021-11-17 VITALS — BP 100/60 | HR 79 | Ht 65.0 in | Wt 148.2 lb

## 2021-11-17 DIAGNOSIS — Z5181 Encounter for therapeutic drug level monitoring: Secondary | ICD-10-CM | POA: Diagnosis not present

## 2021-11-17 DIAGNOSIS — I480 Paroxysmal atrial fibrillation: Secondary | ICD-10-CM | POA: Diagnosis not present

## 2021-11-17 DIAGNOSIS — I10 Essential (primary) hypertension: Secondary | ICD-10-CM

## 2021-11-17 DIAGNOSIS — I701 Atherosclerosis of renal artery: Secondary | ICD-10-CM | POA: Diagnosis not present

## 2021-11-17 DIAGNOSIS — I361 Nonrheumatic tricuspid (valve) insufficiency: Secondary | ICD-10-CM | POA: Diagnosis not present

## 2021-11-17 DIAGNOSIS — Z950 Presence of cardiac pacemaker: Secondary | ICD-10-CM

## 2021-11-17 DIAGNOSIS — E7800 Pure hypercholesterolemia, unspecified: Secondary | ICD-10-CM | POA: Diagnosis not present

## 2021-11-17 DIAGNOSIS — I495 Sick sinus syndrome: Secondary | ICD-10-CM | POA: Diagnosis not present

## 2021-11-17 DIAGNOSIS — E213 Hyperparathyroidism, unspecified: Secondary | ICD-10-CM

## 2021-11-17 DIAGNOSIS — I679 Cerebrovascular disease, unspecified: Secondary | ICD-10-CM

## 2021-11-17 DIAGNOSIS — I739 Peripheral vascular disease, unspecified: Secondary | ICD-10-CM | POA: Diagnosis not present

## 2021-11-17 DIAGNOSIS — I25708 Atherosclerosis of coronary artery bypass graft(s), unspecified, with other forms of angina pectoris: Secondary | ICD-10-CM | POA: Diagnosis not present

## 2021-11-17 DIAGNOSIS — I5042 Chronic combined systolic (congestive) and diastolic (congestive) heart failure: Secondary | ICD-10-CM

## 2021-11-17 DIAGNOSIS — Z79899 Other long term (current) drug therapy: Secondary | ICD-10-CM

## 2021-11-17 MED ORDER — AMLODIPINE BESYLATE 5 MG PO TABS
5.0000 mg | ORAL_TABLET | Freq: Every day | ORAL | 1 refills | Status: DC
Start: 2021-11-17 — End: 2022-04-20

## 2021-11-17 NOTE — Progress Notes (Signed)
Patient ID: [name], female   DOB: September 08, 1940, 81 y.o.   MRN: [id number]     Cardiology Office Note    Date:  11/17/2021   ID:  [name], [name] 09-09-1940, MRN [id number]  PCP:  Horald Pollen, MD  Cardiologist:   Sanda Klein, MD   No chief complaint on file.    History of Present Illness:  [name] is a 81 y.o. female with coronary artery disease s/p bypass surgery (November 2016) and placement of drug-eluting stents (DES to distal SVG-RCA, April 2017 ) and left atrial appendage clipping, symptomatic paroxysmal atrial fibrillation and atrial flutter with radiofrequency ablation (cavotricuspid isthmus and pulmonary vein isolation), PAD with previous lower extremity stents, extensive intracranial atherosclerotic disease, history of GI bleeding while on anticoagulation, volatile hypertension, CKD stage III with occlusion of the right renal artery.  She feels weak and slightly dizzy today and her blood pressure is only 100/60 mmHg.  She feels better laying down.  She has not had problems with angina or dyspnea or any lower extremity edema.  She has not taken any furosemide in at least a month.  She is taking carvedilol and amlodipine as antihypertensive/antianginal agents.  She complains of weak aching quadriceps muscles bilaterally, sometimes also her gluteal muscles, which make her stop when she tries to walk longer distances.  She is currently on rosuvastatin but has had similar complaints with atorvastatin in the past.  She has had substantial improvement in wellbeing following her last coronary revascularization procedure on 11/13/2020 she received a new drug-eluting stent to the SVG-RCA.  The echocardiogram shows only partial recovery of left ventricular systolic function with an ejection fraction of 45-50%, but there has also been substantial improvement in right ventricular function and marked reduction in the severity of tricuspid regurgitation.  Follow-up  duplex ultrasound studies confirmed the occlusion of the right renal artery and atrophy of the right kidney, the stents in her bilateral superficial femoral arteries are widely patent and she has normal ABI bilaterally.  Device function is normal.  Her Medtronic Adapta device implanted in 2013 still has roughly 3 years of longevity.  She has 99.8% atrial pacing and 0.2% ventricular pacing.  When pacing was taken down to 40 bpm there was no underlying atrial or ventricular escape rhythm.  She has not had any atrial fibrillation in over a year.  There have been no episodes of high ventricular rate.   She recovered well from COVID-19 infection in August 2021.  She was hospitalized with abdominal pain at [facility] on April 22, 2020.  Her work-up included an abdominal CT that showed a relatively smaller size right kidney and suspicion for high-grade right renal artery stenosis.  Her creatinine was 1.1 at that time.  Incidental note was also made of a 1.6 cm right adrenal mass and prominent atherosclerosis in the infrarenal abdominal aorta. There was evidence of colonic distention and constipation which was felt to be the cause of her symptoms.  She has a dual-chamber MRI conditional Medtronic Adapta pacemaker that shows 100% atrial paced, ventricular sensed rhythm, underlying rhythm being sinus bradycardia in the 40s.  She has had an excellent response to dofetilide without recent atrial fibrillation.  She has a history of left atrial appendage clipping so was not on anticoagulants.   She has a history of hyperkalemia with ACE inhibitors in the past.  Amiodarone caused hypothyroidism and alopecia.   She has tried taking statins but has repeatedly  stopped them over the years due to musculoskeletal problems, but currently seems to be tolerating rosuvastatin 40 mg daily.  In late August 2020 she underwent an abdominal aortic angiogram with runoff, followed by angioplasty and drug  coated stenting of a chronic total occlusion of the right superficial femoral artery. Preprocedure ABI was 0.82  Post procedure ABI was 1.1 at follow-up on October 18 2018, 1.0 in March 2021 (bilaterally).  By ultrasound performed in July 2021, known occluded mid right SFA stent with reconstitution above the level of the popliteal artery and three-vessel runoff, 80% stenosis in the proximal left SFA with widely patent mid left SFA stent.  She has normal left ventricular systolic function. She does have evidence of grade 2 diastolic dysfunction on previous echo. At the time of her bypass surgery she received an Atricure left atrial appendage clip. She is also on clopidogrel following her drug-eluting stents in April 2017. The pacemaker was implanted in 2013 for symptomatic sinus bradycardia. She also has a history of peripheral arterial disease and received bilateral superficial femoral artery stents in 2016, repeat revascularization with drug-coated stent to a totally occluded right superficial femoral artery in 2020. She has never smoked but drinks daily. She has treated hypertension.  After undergoing bypass surgery in November 2016 she returned with non-ST segment elevation myocardial infarction in April 2017 and required placement of stents both in the native right coronary artery (3.020 mm Synergy DES) and in the saphenous vein graft to the distal right coronary artery (2.7538 mm Synergy DES) due to early graft dysfunction. The saphenous vein graft to the ramus intermedius was also occluded, but this vessel was left for medical therapy.   Echocardiogram in August 2022 showed abrupt reduction in LV function to an EF of 35-40% due to a new inferior wall motion abnormality.  Thallium viability study in September 2022 showed no evidence of myocardial infarction/scar, viability in all territories.  She underwent PCI to the SVG-RCA after cardiac catheterization on 11/13/2020 with symptomatic improvement.   Follow-up echo 01/24/2021 shows partial recovery of LVEF to 40-45 %, improved right ventricular systolic function and marked improvement in the severity of tricuspid insufficiency which is now only mild.  Past Medical History:  Diagnosis Date   ANXIETY 12/31/2009   Aortic insufficiency    a. mod by echo 2017.   CAD (coronary artery disease)    a. s/p CABGx3 and LAA clipping in 12/2014, NSTEMI 05/2015 s/p DES to native RCA and SVG-dRCA; occluded ramus-SVG was treated medically). c. neg nuc 10/2015 at [facility].   Chronic diastolic CHF (congestive heart failure) (HCC)    Chronic fatigue    CKD (chronic kidney disease), stage II    DEPRESSION 12/31/2009   [place] DISEASE, CERVICAL 12/31/2009   [place] DISEASE, LUMBAR 12/31/2009   DIVERTICULITIS, HX OF 12/31/2009   Essential hypertension    Gait abnormality 03/25/2017   GLUCOSE INTOLERANCE 12/31/2009   Habitual alcohol use    History of stroke    self reports " they say i have had some pin strokes"    Hypercalcemia    Hyperkalemia    Hyperlipidemia 12/31/2009   Hypothyroidism    MENOPAUSE, EARLY 12/31/2009   NSTEMI (non-ST elevated myocardial infarction) ([place]) 05/11/2015   Orthostatic hypotension    OSTEOARTHRITIS, HAND    Pacemaker 01/21/2012   MDT Adapta dual chamber   Paroxysmal atrial flutter ([place])    Persistent atrial fibrillation (HCC)    PVD (peripheral vascular disease), hx stents to bil SFAs 02/2010  Rash, skin     superficial raised red pencil point sized rash bilateral forearms; states " it started when i started the Plavix "    Ruptured left breast implant    Symptomatic sinus bradycardia 01/22/2012   Syncope    a. 11/2015 ? due to medications.   Syncope    reports on 05-10-17 " i passed out 2 months ago in the bathroom and broke some ribs"     Tachy-brady syndrome ([place])    a. s/p MDT PPM 2013.   Tricuspid regurgitation     Past Surgical History:  Procedure Laterality Date   ABDOMINAL AORTOGRAM W/LOWER EXTREMITY Bilateral  02/15/2017   Procedure: ABDOMINAL AORTOGRAM W/LOWER EXTREMITY;  Surgeon: Lorretta Harp, MD;  Location: [place] [facility];  Service: Cardiovascular;  Laterality: Bilateral;  bilat   ABDOMINAL AORTOGRAM W/LOWER EXTREMITY N/A 10/06/2018   Procedure: ABDOMINAL AORTOGRAM W/LOWER EXTREMITY;  Surgeon: Lorretta Harp, MD;  Location: [place] [facility];  Service: Cardiovascular;  Laterality: N/A;   Ablation of typical atrial flutter (CTI line)  09/05/2016   Dr Antonieta Pert at [place]   Removed 01/2018   BREAST IMPLANT REMOVAL Bilateral 01/14/2018   Procedure: REMOVAL BILATERAL BREAST IMPLANTS;  Surgeon: Irene Limbo, MD;  Location: [place];  Service: Plastics;  Laterality: Bilateral;   CAPSULECTOMY Bilateral 01/14/2018   Procedure: CAPSULECTOMY;  Surgeon: Irene Limbo, MD;  Location: [place];  Service: Plastics;  Laterality: Bilateral;   CARDIAC CATHETERIZATION N/A 01/01/2015   Procedure: Left Heart Cath and Coronary Angiography;  Surgeon: Jettie Booze, MD;  Location: [place] [facility];  Service: Cardiovascular;  Laterality: N/A;   CARDIAC CATHETERIZATION N/A 05/12/2015   Procedure: Left Heart Cath and Coronary Angiography;  Surgeon: Troy Sine, MD;  Location: [place] [facility];  Service: Cardiovascular;  Laterality: N/A;   CARDIAC CATHETERIZATION N/A 05/12/2015   Procedure: Coronary Stent Intervention;  Surgeon: Troy Sine, MD;  Location: [place] [facility];  Service: Cardiovascular;  Laterality: N/A;   CARDIOVERSION N/A 02/01/2015   Procedure: CARDIOVERSION;  Surgeon: Lelon Perla, MD;  Location: [facility] ENDOSCOPY;  Service: Cardiovascular;  Laterality: N/A;   CARDIOVERSION N/A 08/30/2015   Procedure: CARDIOVERSION;  Surgeon: Sanda Klein, MD;  Location: [place] ENDOSCOPY;  Service: Cardiovascular;  Laterality: N/A;   CARPAL TUNNEL RELEASE  2008   "right hand/thumb; carpal tunnel repair; got rid of arthritis"  (01/21/2012)   CLIPPING OF ATRIAL APPENDAGE N/A 01/04/2015   Procedure: CLIPPING OF ATRIAL APPENDAGE;  Surgeon: Grace Isaac, MD;  Location: [place];  Service: Open Heart Surgery;  Laterality: N/A;   COLONOSCOPY N/A 12/21/2015   Procedure: COLONOSCOPY;  Surgeon: Milus Banister, MD;  Location: [facility] ENDOSCOPY;  Service: Endoscopy;  Laterality: N/A;   CORONARY ARTERY BYPASS GRAFT N/A 01/04/2015   Procedure: CORONARY ARTERY BYPASS GRAFTING (CABG) x 3 using left internal mammory artery and greater saphenous vein right leg harvested endoscopically.;  Surgeon: Grace Isaac, MD;  LIMA-LAD, SVG-RI, SVG-PDA   CORONARY STENT INTERVENTION N/A 11/07/2020   Procedure: CORONARY STENT INTERVENTION;  Surgeon: Early Osmond, MD;  Location: [place] [facility];  Service: Cardiovascular;  Laterality: N/A;   ESOPHAGOGASTRODUODENOSCOPY (EGD) WITH PROPOFOL N/A 12/18/2015   Procedure: ESOPHAGOGASTRODUODENOSCOPY (EGD) WITH PROPOFOL;  Surgeon: Milus Banister, MD;  Location: [facility];  Service: Endoscopy;  Laterality: N/A;   FACELIFT, LOWER 2/3  1995   "mini" (01/21/2012)   GIVENS CAPSULE  STUDY N/A 12/21/2015   Procedure: GIVENS CAPSULE STUDY;  Surgeon: Milus Banister, MD;  Location: [facility];  Service: Endoscopy;  Laterality: N/A;   LEFT HEART CATH AND CORS/GRAFTS ANGIOGRAPHY N/A 11/07/2020   Procedure: LEFT HEART CATH AND CORS/GRAFTS ANGIOGRAPHY;  Surgeon: Early Osmond, MD;  Location: [place] [facility];  Service: Cardiovascular;  Laterality: N/A;   Lower Arterial Examination  10/28/2011   R. SFA stent mild-moderate mixed density plaque with elevated velocities consistent with 50% diameter reduction. L. SFA stent moderate mixed denisty plaque at mid to distal level consistent with 50-69% diameter reduction.   LOWER EXTREMITY ANGIOGRAPHY N/A 02/15/2017   Procedure: LOWER EXTREMITY ANGIOGRAPHY;  Surgeon: Lorretta Harp, MD;  Location: [place] [facility];  Service: Cardiovascular;  Laterality: N/A;    OOPHORECTOMY  ~1979   PARATHYROIDECTOMY N/A 05/13/2017   Procedure: PARATHYROIDECTOMY;  Surgeon: Armandina Gemma, MD;  Location: [facility];  Service: General;  Laterality: N/A;   PARTIAL COLECTOMY  2010   PERIPHERAL ARTERIAL STENT GRAFT  2012; 2012   "LLE; RLE" (01/21/2012)   PERIPHERAL VASCULAR BALLOON ANGIOPLASTY Right 02/15/2017   Procedure: PERIPHERAL VASCULAR BALLOON ANGIOPLASTY;  Surgeon: Lorretta Harp, MD;  Location: [place] [facility];  Service: Cardiovascular;  Laterality: Right;  SFA     PERIPHERAL VASCULAR INTERVENTION Right 10/06/2018   Procedure: PERIPHERAL VASCULAR INTERVENTION;  Surgeon: Lorretta Harp, MD;  Location: [place] [facility];  Service: Cardiovascular;  Laterality: Right;   PERMANENT PACEMAKER INSERTION N/A 01/21/2012   Medtronic Adapta L implanted by Dr Sallyanne Kuster for tachy/brady syndrome   POSTERIOR CERVICAL LAMINECTOMY  1985   TEE WITHOUT CARDIOVERSION N/A 01/04/2015   Procedure: TRANSESOPHAGEAL ECHOCARDIOGRAM (TEE);  Surgeon: Grace Isaac, MD;  Location: [place];  Service: Open Heart Surgery;  Laterality: N/A;   TEE WITHOUT CARDIOVERSION N/A 02/01/2015   Procedure: TRANSESOPHAGEAL ECHOCARDIOGRAM (TEE);  Surgeon: Lelon Perla, MD;  Location: [facility] ENDOSCOPY;  Service: Cardiovascular;  Laterality: N/A;   TEE WITHOUT CARDIOVERSION N/A 08/30/2015   Procedure: TRANSESOPHAGEAL ECHOCARDIOGRAM (TEE);  Surgeon: Sanda Klein, MD;  Location: [facility] END[facility]COPY;  Service: Cardiovascular;  Laterality: N/A;   VAGINAL HYSTERECTOMY  1975    Current Medications: Outpatient Medications Prior to Visit  Medication Sig Dispense Refill   amLODipine (NORVASC) 5 MG tablet TAKE 1 TABLET (5 MG TOTAL) BY MOUTH IN THE MORNING AND AT BEDTIME 180 tablet 1   ascorbic acid (VITAMIN C) 250 MG CHEW Chew 750 mg by mouth daily.     aspirin EC 81 MG tablet Take 81 mg by mouth daily.      Biotin 10000 MCG TABS Take 10,000 mcg by mouth daily.     carvedilol (COREG) 6.25 MG tablet TAKE 1 TABLET BY  MOUTH 2 TIMES DAILY WITH A MEAL. 180 tablet 1   Cholecalciferol (VITAMIN D) 50 MCG (2000 UT) tablet Take 2,000 Units by mouth daily.     citalopram (CELEXA) 40 MG tablet TAKE 1 TABLET BY MOUTH EVERY DAY 90 tablet 1   clopidogrel (PLAVIX) 75 MG tablet TAKE 1 TABLET BY MOUTH DAILY WITH BREAKFAST. 90 tablet 3   Cyanocobalamin (B-12) 5000 MCG CAPS Take 10,000 mcg by mouth daily.     dofetilide (TIKOSYN) 125 MCG capsule TAKE 1 CAPSULE BY MOUTH EVERY 12 HOURS. (GENERIC FOR TIKOSYN) 60 capsule 0   furosemide (LASIX) 40 MG tablet TAKE 1 TABLET BY MOUTH EVERY OTHER DAY (Patient taking differently: 20 mg as needed (as needed for edema).) 20 tablet 2  furosemide (LASIX) 80 MG tablet Take 1 tablet (80 mg total) by mouth daily. 90 tablet 3   loteprednol (LOTEMAX) 0.5 % ophthalmic suspension Place 2 drops into both eyes See admin instructions. Instill 2 drops into both eyes 2 times a day twice weekly     Magnesium 200 MG TABS Take 200 mg by mouth daily.     Multiple Vitamin (MULTIVITAMIN WITH MINERALS) TABS tablet Take 1 tablet by mouth 2 (two) times daily.     Multiple Vitamins-Minerals (ZINC PO) Take 150 mg by mouth daily.     nitroGLYCERIN (NITROSTAT) 0.4 MG SL tablet Place 1 tablet (0.4 mg total) under the tongue every 5 (five) minutes x 3 doses as needed for chest pain. 25 tablet 2   VITAMIN D-VITAMIN K PO Take 1 capsule by mouth daily.     buPROPion (WELLBUTRIN) 75 MG tablet Take 1 tablet (75 mg total) by mouth daily. 30 tablet 1   escitalopram (LEXAPRO) 10 MG tablet Take 1 tablet (10 mg total) by mouth daily. 30 tablet 1   methocarbamol (ROBAXIN) 500 MG tablet Take 1 tablet (500 mg total) by mouth 2 (two) times daily. 20 tablet 1   oxyCODONE-acetaminophen (PERCOCET/ROXICET) 5-325 MG tablet Take 1-2 tablets by mouth every 6 (six) hours as needed for severe pain. 15 tablet 0   traMADol (ULTRAM) 50 MG tablet Take 1 tablet by mouth every 6-8 hours as needed for severe pain. 20 tablet 1   rosuvastatin  (CRESTOR) 40 MG tablet Take 1 tablet (40 mg total) by mouth daily. 90 tablet 3   No facility-administered medications prior to visit.     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  Retired.   Social Determinants of Health   Financial Resource Strain: Not on file  Food Insecurity: Not on file  Transportation Needs: Not on file  Physical Activity: Not on file  Stress: Not on file  Social Connections: Not on file     Family History:  The patient's family history includes CVA in her brother; Heart attack in her brother; Heart disease in her brother, father, and mother; Hypertension in her brother and mother; Stroke in her father and mother.   ROS:   Please see the history of present illness.   All other systems are reviewed and are negative.   PHYSICAL EXAM:   VS:  BP 100/60   Pulse 79   Ht _0  (1.651 m)   Wt  148 lb 3.2 oz (67.2 kg)   SpO2 98%   BMI 24.66 kg/m     Wt Readings from Last 3 Encounters:  11/17/21 148 lb 3.2 oz (67.2 kg)  10/07/21 147 lb 6 oz (66.8 kg)  05/28/21  144 lb 6 oz (65.5 kg)      General: Alert, oriented x3, no distress, healthy left subclavian pacemaker site.  She does not appear pale Head: no evidence of trauma, PERRL, EOMI, no exophtalmos or lid lag, no myxedema, no xanthelasma; normal ears, nose and oropharynx Neck: normal jugular venous pulsations and no hepatojugular reflux; brisk carotid pulses without delay and no carotid bruits Chest: clear to auscultation, no signs of consolidation by percussion or palpation, normal fremitus, symmetrical and full respiratory excursions Cardiovascular: normal position and quality of the apical impulse, regular rhythm, normal first and second heart sounds, no murmurs, rubs or gallops Abdomen: no tenderness or distention, no masses by palpation, no abnormal pulsatility or arterial bruits, normal bowel sounds, no hepatosplenomegaly Extremities: no clubbing, cyanosis or edema; 2+ radial, ulnar and brachial pulses bilaterally; 2+ pedal pulses bilaterally.  She has bilateral femoral bruits Neurological: grossly nonfocal Psych: Normal mood and affect     Studies/Labs Reviewed:   ECHO 01/24/2021  1. Hypokinesis of the basal to mid inferoseptum and basal to mid inferior  myocardium. Left ventricular ejection fraction, by estimation, is 40 to  45%. The left ventricle has mildly decreased function. The left ventricle  demonstrates regional wall motion   abnormalities (see scoring diagram/findings for description). Left  ventricular diastolic parameters are consistent with Grade II diastolic  dysfunction (pseudonormalization).   2. Right ventricular systolic function is normal. The right ventricular  size is normal. There is normal pulmonary artery systolic pressure.   3. The mitral valve is normal in structure. No evidence of mitral valve  regurgitation. No evidence of mitral stenosis.   4. The aortic valve is normal in structure. Aortic valve regurgitation is  mild to moderate. No aortic stenosis is  present. Aortic regurgitation PHT  measures 376 msec.   5. The inferior vena cava is normal in size with greater than 50%  respiratory variability, suggesting right atrial pressure of 3 mmHg.   Thallium rest redistribution viability study 10/28/2020 :   Prior study available for comparison. Performed at [facility] 12/2015.  Negative per patient.  No results available for review   The rest and delayed thallium study shows viable myocardium in all segments of the LV   There is no evidence of previous myocardial infarction .   ECHO 09/24/2020:   1. Compared with the echo 07/6292, systolic function has worsened. Left ventricular ejection fraction, by estimation, is 35 to 40%. The left ventricle has moderately decreased function. The left ventricle demonstrates global hypokinesis. There is mild concentric left ventricular hypertrophy. Left ventricular diastolic parameters are consistent with Grade II diastolic dysfunction (pseudonormalization). Elevated left ventricular end-diastolic pressure.   2. Right ventricular systolic function is normal. The right ventricular size is normal. There is moderately elevated pulmonary artery systolic pressure.   3. Left atrial size was moderately dilated.   4. The mitral valve is normal in structure. Mild mitral valve regurgitation. No evidence of mitral stenosis.   5. Tricuspid valve regurgitation is moderate to severe.   6. The aortic valve is tricuspid. There is mild calcification of the  aortic valve. There is mild thickening of the aortic valve. Aortic valve  regurgitation is mild to moderate. No aortic stenosis is present.  7. The inferior vena cava is normal in size with greater than 50%  respiratory variability, suggesting right atrial pressure of 3 mmHg.   Cardiac catheterization 05/12/2015 Mid LAD lesion, 85% stenosed. Ramus lesion, 80% stenosed. Prox Cx to Mid Cx lesion, 40% stenosed. SVG was injected is normal in caliber. There is severe disease in the  graft. Ostial occlusion of SVG to the ramus intermediate vessel. Origin to Prox Graft lesion, 100% stenosed. LIMA was injected is normal in caliber, and is anatomically normal. There is competitive flow. Widely patent LIMA graft which supplies the mid LAD. SVG was injected is large. There is severe disease in the graft. Severely diseased proximal to mid portion of the vein graft which supplied the PDA vessel. Of note, the distal RCA. Antegrade to the PDA was totally occluded and did not supply the proximal to mid RCA due to this occlusion. Prox Graft lesion, 70% stenosed. Dist RCA-1 lesion, 100% stenosed. Dist RCA-2 lesion, 100% stenosed. Origin lesion, 95% stenosed. Post intervention, there is a 0% residual stenosis. Prox RCA lesion, 95% stenosed. Post intervention, there is a 0% residual stenosis. There is moderate left ventricular systolic dysfunction.   Significant native multivessel CAD with diffuse 85% proximal LAD stenosis with evidence for competitive filling via the LIMA graft supplying the mid LAD; 75-80% diffuse proximal ramus intermediate stenosis with evidence for an occluded  vein graft anastomosed in its midst portion of the vessel; 40% left circumflex stenosis, and 95% proximal RCA stenosis which supplies 2 moderate size marginal branches prior to being totally occluded in the region of the acute margin/distal RCA.   Severely diseased SVG which supplies a large distal RCA system giving rise to several branches and anastomosing into the PDA.  The native distal RCA is occluded a short distance prior to the PDA takeoff and the graft does not supply the proximal - mid native vessel due to this occlusion.     Occluded SVG at its origin to the ramus intermediate vessel.   Patent LIMA graft supplying the mid LAD.   Successful percutaneous coronary intervention of the proximal to mid vein graft supplying the large distal RCA with ultimate insertion of a 2.7538 mm Synergy DES stent  postdilated to 2.91 mm with the 95 and diffuse 70% stenosis being reduced to 0%.   Successful percutaneous coronary intervention to the proximal native RCA which supplies 2 moderate size marginal branches prior to the acute margin/ distal RCA occlusion with PTCA/ 3.020 mm Synergy DES stent postdilated to 3.25 mm with the 95% stenosis being reduced to 0%.   RECOMMENDATION: [name] has a history of rash secondary to Plavix.  She will be treated with aspirin/Brilinta forward DAPT therapy.  She had experienced continued anginal type symptoms, although mild, despite her bypass surgery which may have been due to a proximal RCA.  Subsequently she has developed unstable angina leading to her non-STEMI.  Following successful intervention, the SVG to her large distal RCA is now wide open as is her proximal native RCA which supplies two marginal branches which were not supplied by the SVG graft.  If the patient continues to experience increasing symptoms of chest pain consider PCI to the ramus intermediate vessel since the graft that supplied this is occluded.  Diagnostic Dominance: Right Intervention  Implants     Permanent Stent  Stent Synergy Des 2.75x38 - [id number] - Implanted  Stent Synergy Des 3x20 - [id number] - Implanted    Cardiac Cath 11/07/2020    Ramus lesion  is 65% stenosed.   Prox LAD lesion is 80% stenosed.   Mid LAD lesion is 100% stenosed.   Dist RCA lesion is 100% stenosed.   Origin lesion is 100% stenosed.   Origin to Prox Graft lesion is 99% stenosed.   Previously placed Prox RCA stent (unknown type) is  widely patent.   A drug-eluting stent was successfully placed using a STENT ONYX FRONTIER 3.0X18.   Post intervention, there is a 0% residual stenosis.   LIMA graft was visualized by angiography and is normal in caliber.   SVG graft was not injected.   The graft exhibits no disease.   LV end diastolic pressure is normal.    High grade in-stent restenosis in vein graft to  PDA treated with one drug-eluting stent. Moderate unchanged disease of unrevascularized ramus intermedius branch. Patent LIMA to LAD with known occlusion of vein graft to ramus intermedius. Normal LVEDP.   Recommendations:  Continue DAPT and aggressive cardiovascular risk factor modification.  Anticipate same day discharge if criteria are met.     Coronary Diagrams  Diagnostic Dominance: Right Intervention  Implants     Permanent Stent  Stent Onyx Frontier 3.0x18 - [id number] - Implanted Inventory item: STENT ONYX FRONTIER 3.0X18      EKG:  EKG is not ordered today.  Recent ECG shows atrial paced, ventricular sensed rhythm with QS V1 V2, most recent QTC 474 ms  Recent Labs: 10/07/2021: ALT 14; BUN 20; Creatinine, Ser 1.29; Hemoglobin 14.9; Platelets 241.0; Potassium 4.5; Sodium 136; TSH 1.17   Lipid Panel    Component Value Date/Time   CHOL 137 05/28/2021 1412   CHOL 202 (H) 06/11/2020 0952   TRIG 239.0 (H) 05/28/2021 1412   HDL 55.30 05/28/2021 1412   HDL 89 06/11/2020 0952   CHOLHDL 2 05/28/2021 1412   VLDL 47.8 (H) 05/28/2021 1412   LDLCALC 84 06/11/2020 0952   LDLDIRECT 68.0 05/28/2021 1412    ASSESSMENT:    1. Coronary artery disease of bypass graft of native heart with stable angina pectoris ([place])   2. PAF (paroxysmal atrial fibrillation) ([place])   3. Encounter for monitoring dofetilide therapy   4. SSS (sick sinus syndrome) ([place])   5. Pacemaker   6. Chronic combined systolic and diastolic CHF (congestive heart failure) ([place])   7. PAD (peripheral artery disease) ([place])   8. Renal artery stenosis ([place])   9. Cerebrovascular small vessel disease   10. Essential hypertension   11. Hypercholesterolemia   12. Nonrheumatic tricuspid valve regurgitation   13. Hyperparathyroidism ([place])         PLAN:  In order of problems listed above:  CAD: She had substantial functional improvement following the PCI to RCA and SVG to RCA, even though the echocardiogram shows  only partial recovery of LVEF which remains mildly depressed.  We have stopped the isosorbide and she has not had recurrent angina.  She is hypotensive today so we will decrease the dose of amlodipine.  Continue carvedilol unchanged.   We will try to discontinue the isosorbide, but continue the carvedilol and amlodipine.  On dual antiplatelet therapy, most recent revascularization procedure October 2022.  Plan lifelong dual antiplatelet therapy due to the extent and complexity of her coronary and peripheral arterial revascularization procedures. AFib: Excellent response to ablation and ongoing dofetilide therapy.  No recurrent episodes of atrial fibrillation in the last year.   She has had clipping over left atrial appendage and is not on anticoagulation.   Dofetilide: Dose adjusted for  renal function changes.  QTc 460 ms on current medications.  Recent labs show potassium 4.5 and creatinine 1.29 (GFR approximately 40). SSS: In the past she did not appear to be pacemaker dependent today has no underlying rhythm when pacing is taken down to 45 bpm.  She is "atrially dependent" as there is no junctional or ventricular escape rhythm.  She should be considered pacemaker dependent going for there.  For her level of activity the current heart rate histogram distribution appears appropriate. PPM: Normal pacemaker function.  Continue remote downloads every 3 months.  Should be considered pacemaker dependent. CHF: She has not required any diuretics since the last PCI with improvement in LVEF to 40-45%.  Had problems with diastolic heart failure even before the drop in LV function.  NYHA functional class I, clinically euvolemic.  Have offered treatment with SGLT2 inhibitors, but she told me that she is interested in getting rid of some of her medications not adding any more.   PAD: Has not had any problems with claudication since her last PTA in August 2021.  Despite restenosis on one side, she has ABI 1.0 bilaterally  and does not have intermittent claudication.  On dual antiplatelet therapy.  Also noted that she has lower blood pressure in the left arm suggesting left subclavian stenosis.  Right renal artery occlusion: Atrophic, nonfunctional right kidney.  Unfortunately, she has completely lost flow to her right kidney and this was associated with a fairly abrupt worsening of renal function from a baseline creatinine of 0.8-1.0 up to 1.6, but there has been subsequent gradual improvement in renal function and her most recent creatinine is stable around 1.2, GFR around 40. ASCVD: No recent focal neurological complaints.  No stenoses in the extracranial carotid arteries.  She has mostly small vessel disease and the plan is for her to remain on clopidogrel indefinitely.  Avoid hypotension.  Best to allow systolic blood pressure around 140 to avoid ischemic cerebral injury.  Her blood pressure today is decidedly too low. HTN: Borderline hypotensive today.  Blood pressure is always lower in the left arm, probable left subclavian stenosis.  Avoid ACE inhibitors/ARB/ARNI due to history of hyperkalemia while taking lisinopril.  Reduce amlodipine to 5 mg once daily. Hypercholesterolemia: Tolerated rosuvastatin for a while, but when we reached a dose that provides sufficient LDL reduction she again has problems with statin myopathy.  We will switch to Repatha.. TR: moderate to severe on TEE 2017 before SVG-RCA stent, now back to mild suggesting that a large part of her problems were related to right ventricular ischemia.  The previous increased severity TR cannot be attributed to her pacemaker lead. 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  Normal calcium levels by most recent labs.     Medication Adjustments/Labs and Tests Ordered: Current medicines are reviewed at length with the patient today.  Concerns regarding medicines are outlined above.  Medication changes, Labs and Tests ordered  today are listed in the Patient Instructions below. There are no Patient Instructions on file for this visit.   Signed, Sanda Klein, MD  11/17/2021 11:22 AM    [facility] [place], [place], [place]  [address] Phone: [phone]; Fax: [phone]

## 2021-11-17 NOTE — Patient Instructions (Addendum)
Medication Instructions:  STOP the Plavix STOP the Rosuvastatin  DECREASE the Amlodipine to 5 mg once daily  Dr. Sallyanne Kuster recommends Repatha (PCSK9). This is an injectable cholesterol medication. This medication will need prior approval with your insurance company, which we will work on. If the medication is not approved initially, we may need to do an appeal with your insurance. We will keep you updated on this process. This medication can be provided at some local pharmacies or be shipped to you from a specialty pharmacy.    *If you need a refill on your cardiac medications before your next appointment, please call your pharmacy*   Lab Work: Your provider would like for you to return in 3 months to have the following labs drawn: fasting Lipid. You do not need an appointment for the lab. Once in our office lobby there is a podium where you can sign in and ring the doorbell to alert [place] that you are here. The lab is open from 8:00 am to 4 pm; closed for lunch from 12:45pm-1:45pm.  You may also go to any of these [facility] locations:   [place] ([place]) - [place] [employer] [address]     If you have labs (blood work) drawn today and your tests are completely normal, you will receive your results only by: [employer] (if you have MyChart) OR A paper copy in the mail If you have any lab test that is abnormal or we need to change your treatment, we will call you to review the results.   Testing/Procedures: None ordered   Follow-Up: At [facility], you and your health needs are our priority.  As part of our continuing mission to provide you with exceptional heart care, we have created designated Provider Care Teams.  These Care Teams include your primary Cardiologist (physician) and Advanced Practice Providers (APPs -  Physician Assistants and Nurse Practitioners) who all work together to provide  you with the care you need, when you need it.  We recommend signing up for the patient portal called "MyChart".  Sign up information is provided on this After Visit Summary.  MyChart is used to connect with patients for Virtual Visits (Telemedicine).  Patients are able to view lab/test results, encounter notes, upcoming appointments, etc.  Non-urgent messages can be sent to your provider as well.   To learn more about what you can do with MyChart, go to [email or web address].    Your next appointment:   6 month(s)  The format for your next appointment:   In Person  Provider:   Sanda Klein, MD     Other Instructions A referral has been placed to pharmd to start [place].   Important Information About [place]

## 2021-11-19 ENCOUNTER — Encounter: Payer: Self-pay | Admitting: Cardiovascular Disease

## 2021-11-21 ENCOUNTER — Other Ambulatory Visit (HOSPITAL_BASED_OUTPATIENT_CLINIC_OR_DEPARTMENT_OTHER): Payer: Self-pay

## 2021-11-21 NOTE — Progress Notes (Signed)
Remote pacemaker transmission.   

## 2021-12-10 ENCOUNTER — Ambulatory Visit (HOSPITAL_COMMUNITY): Payer: Medicare HMO | Admitting: Psychiatry

## 2021-12-23 ENCOUNTER — Telehealth: Payer: Self-pay | Admitting: Emergency Medicine

## 2021-12-23 ENCOUNTER — Ambulatory Visit: Payer: Medicare HMO

## 2021-12-23 NOTE — Telephone Encounter (Signed)
Called pt to schedule AWV, pt stated she was out of town due to a death in the family and would call back to schedule AWV at a later time.    Last AWV  01/09/14  Please schedule at anytime with [facility] if patient calls the office back.     Any questions, please call me at [phone]

## 2022-01-14 ENCOUNTER — Other Ambulatory Visit: Payer: Self-pay | Admitting: Emergency Medicine

## 2022-01-14 ENCOUNTER — Encounter: Payer: Self-pay | Admitting: Emergency Medicine

## 2022-01-14 DIAGNOSIS — U071 COVID-19: Secondary | ICD-10-CM

## 2022-01-14 MED ORDER — NIRMATRELVIR/RITONAVIR (PAXLOVID) TABLET (RENAL DOSING): 2.0000 | ORAL_TABLET | Freq: Two times a day (BID) | ORAL | 0 refills | Status: AC

## 2022-01-14 NOTE — Telephone Encounter (Signed)
Prescription for Paxlovid sent to local pharmacy of record today.

## 2022-01-15 ENCOUNTER — Telehealth: Payer: Self-pay | Admitting: Emergency Medicine

## 2022-01-15 ENCOUNTER — Telehealth: Payer: Medicare HMO | Admitting: Emergency Medicine

## 2022-01-15 ENCOUNTER — Telehealth: Payer: Self-pay | Admitting: Licensed Clinical Social Worker

## 2022-01-15 NOTE — Telephone Encounter (Signed)
Okay to take

## 2022-01-15 NOTE — Telephone Encounter (Signed)
Patient called and is concerned because the Paxlovid prescribed for her says not to take it if you have kidney issues. She wants to make sure it is okay for her to take it. Patient would like a callback with further instructions, callback number is [phone].

## 2022-01-15 NOTE — Patient Outreach (Signed)
  Care Coordination   01/15/2022 Name: [name] MRN: [id number] DOB: [birth year]   Care Coordination Outreach Attempts:  An unsuccessful telephone outreach was attempted today to offer the patient information about available care coordination services as a benefit of their health plan.   Follow Up Plan:  Additional outreach attempts will be made to offer the patient care coordination information and services.   Encounter Outcome:  No Answer   Care Coordination Interventions:  No, not indicated    [name], [place] [phone]

## 2022-01-15 NOTE — Telephone Encounter (Signed)
Called patient to inform her of provider recommendation

## 2022-01-26 ENCOUNTER — Other Ambulatory Visit: Payer: Self-pay | Admitting: Cardiovascular Disease

## 2022-01-27 MED ORDER — DOFETILIDE 125 MCG PO CAPS: ORAL_CAPSULE | ORAL | 0 refills | Status: DC

## 2022-01-28 ENCOUNTER — Ambulatory Visit: Payer: Self-pay | Admitting: Licensed Clinical Social Worker

## 2022-01-28 NOTE — Patient Instructions (Signed)
Visit Information  Thank you for taking time to visit with me today. Please don't hesitate to contact me if I can be of assistance to you.   Following are the goals we discussed today:   Goals Addressed             This Visit's Progress    Care Coordination Activities       Care Coordination Interventions: Reviewed Care Coordination Services:would like mental health support Assessed Social Determinants of Health Reviewed all upcoming appointments in Epic system Solution-Focused Strategies employed:  Provided general psycho-education for mental health needs  Reviewed mental health medications and discussed importance of compliance: currently taking Celexa  Provided EMMI education information on :Depression            Our next appointment is by telephone on 02/11/22 at 9:30  Please call the care guide team at [phone] if you need to cancel or reschedule your appointment.   If you are experiencing a Mental Health or [place] or need someone to talk to, please call the Suicide and Crisis Lifeline: 988 call the [place] National Suicide Prevention Lifeline: 606-273-5989 or TTY: 619-619-8954 TTY (662) 097-1957) to talk to a trained counselor call 1-800-273-TALK (toll free, 24 hour hotline)   Patient verbalizes understanding of instructions and care plan provided today and agrees to view in [place]. Active MyChart status and patient understanding of how to access instructions and care plan via MyChart confirmed with patient.     [name], [place] [phone]

## 2022-01-28 NOTE — Patient Outreach (Signed)
  Care Coordination  Initial Visit Note   01/28/2022 Name: Liani Caris MRN: [id number] DOB: 23-May-1940  Peyton Bottoms Ballester is a 81 y.o. year old female who sees Sagardia, Ines Bloomer, MD for primary care. I spoke with  [name] by phone today.  What matters to the patients health and wellness today?  Decreasing symptoms of depression.    Goals Addressed             This Visit's Progress    Care Coordination Activities       Care Coordination Interventions: Reviewed Care Coordination Services:would like mental health support Assessed Social Determinants of Health Reviewed all upcoming appointments in Epic system Solution-Focused Strategies employed:  Provided general psycho-education for mental health needs  Reviewed mental health medications and discussed importance of compliance: currently taking Celexa  Provided EMMI education information on :Depression            SDOH assessments and interventions completed:  Yes  SDOH Interventions Today    Flowsheet Row Most Recent Value  SDOH Interventions   Food Insecurity Interventions Intervention Not Indicated  Housing Interventions Intervention Not Indicated  Transportation Interventions Intervention Not Indicated  Utilities Interventions Intervention Not Indicated        Care Coordination Interventions:  Yes, provided   Follow up plan: Follow up call scheduled for 02/11/22    Encounter Outcome:  Pt. Visit Completed   [name], [place] [phone]

## 2022-02-10 ENCOUNTER — Ambulatory Visit (INDEPENDENT_AMBULATORY_CARE_PROVIDER_SITE_OTHER): Payer: Medicare HMO

## 2022-02-10 ENCOUNTER — Ambulatory Visit: Payer: Medicare HMO | Attending: Student | Admitting: Student

## 2022-02-10 DIAGNOSIS — E785 Hyperlipidemia, unspecified: Secondary | ICD-10-CM | POA: Diagnosis not present

## 2022-02-10 DIAGNOSIS — I495 Sick sinus syndrome: Secondary | ICD-10-CM

## 2022-02-10 MED ORDER — ROSUVASTATIN CALCIUM 10 MG PO TABS: 10.0000 mg | ORAL_TABLET | Freq: Every day | ORAL | 3 refills | Status: DC

## 2022-02-10 NOTE — Patient Instructions (Signed)
Your Results:             Your most recent labs Goal  Total Cholesterol 137 < 200  Triglycerides 239 < 150  HDL (happy/good cholesterol) 55 > 40  LDL (lousy/bad cholesterol 68 < 55   Medication changes: We will start the process to get PCSK9i (Repatha or Praluent)  covered by your insurance.  Once the prior authorization is complete, we will call you to let you know and confirm pharmacy information.      Praluent and Repatha are a cholesterol medications that improved your body's ability to get rid of "bad cholesterol" known as LDL. It can lower your LDL up to 60%. It is an injection that is given under the skin every 2 weeks. The most common side effects of Praluent include runny nose, symptoms of the common cold, rarely flu or flu-like symptoms, back/muscle pain in about 3-4% of the patients, and redness, pain, or bruising at the injection site.    Lab orders: We want to repeat labs after 2-3 months.  We will send you a lab order to remind you once we get closer to that time.        Copay Assistance:  The [employer] foundation offers assistance to help pay for medication copays.  They will cover copays for all cholesterol lowering meds, including statins, fibrates, omega-3 oils, ezetimibe, Repatha, Praluent, Nexletol, Nexlizet.  The cards are usually good for $2,500 or 12 months, whichever comes first.

## 2022-02-10 NOTE — Assessment & Plan Note (Addendum)
Assessment:  LDL goal: <55 mg/dl last LDLc 68 mg/dl ( 04 /2023) while on rosuvastatin 20 mg  Intolerance to high intensity- muscle aches and muscle cramps  Willing to try low dose rosuvastatin ( moderate intensity  Discussed next potential options (ezetimibe, PCSK-9 inhibitors, bempedoic acid and inclisiran); cost, dosing efficacy, side effects  Given the risk factors PCSK9i would be next reasonable option to add to max tolerated   Patient was concern about high co-pay on brand name mediation   Plan: Start taking rosuvastatin 10 mg daily if not able to tolerate adjust the dose to 5 mg daily  Will apply for PA for PCSK9i; will inform patient upon approval  To avid affordability issue will enroll patient in the [employer].  Lipid lab due in 2-3 months after starting [facility]

## 2022-02-10 NOTE — Progress Notes (Signed)
Patient ID: [name]                 DOB: 31-Mar-1940                    MRN: [id number]      HPI: [name] is a 82 y.o. female patient referred to lipid clinic by Dr.Croitoru. PMH is significant for PAF(paroxysmal atrial fibrillation), CAD, HF, hypertension, osteoarthritis, CKD stage 2, hx of MI, Cerebrovascular disease.   Patient have tried different statins doses. Rosuvastatin and atorvastatin are the drug names she could recalls. They tend to give severe muscle cramps and muscle aches. The last statin she tried was rosuvastatin 20 mg it was bothersome too. Last lipid result in April patient was on rosuvastatin 20 mg. Patient is willing to try low dose rosuvastatin (5 or 10 mg) Patient lives alone so cooking for singe person is hard. He states she need to improve on diet. Arthritis limits the activities but willing to restart 30 min walks     Current Medications: rosuvastatin 10 mg  Intolerances:  rosuvastatin 20 and 40 mg, atorvastatin 80 and 20 mg   Risk Factors: CAD,PVD, hx of stroke, hx of MI in 2016, premature family history of stroke (one brother had stroke at age of 74) LDL goal: < 55 mg/dl   Diet:  Sandwiches, salads, soup with cracker Eats out 2-3 times week  No snacks  Drink: water    Exercise: stretches every day, willing to start 30 min walks every day   Family History:  Mother- stroke age of 63   Father- cerebral hemorrhage at age of 37  Brother - died of heart attack  Other brother- stroke at age 72   Social History:  Smoking: none  Alcohol: 6 drinks per week   Labs: Lipid Panel     Component Value Date/Time   CHOL 137 05/28/2021 1412   CHOL 202 (H) 06/11/2020 0952   TRIG 239.0 (H) 05/28/2021 1412   HDL 55.30 05/28/2021 1412   HDL 89 06/11/2020 0952   CHOLHDL 2 05/28/2021 1412   VLDL 47.8 (H) 05/28/2021 1412   LDLCALC 84 06/11/2020 0952   LDLDIRECT 68.0 05/28/2021 1412   LABVLDL 29 06/11/2020 0952    Past Medical History:  Diagnosis  Date   ANXIETY 12/31/2009   Aortic insufficiency    a. mod by echo 2017.   CAD (coronary artery disease)    a. s/p CABGx3 and LAA clipping in 12/2014, NSTEMI 05/2015 s/p DES to native RCA and SVG-dRCA; occluded ramus-SVG was treated medically). c. neg nuc 10/2015 at [facility].   Chronic diastolic CHF (congestive heart failure) (HCC)    Chronic fatigue    CKD (chronic kidney disease), stage II    DEPRESSION 12/31/2009   [place] DISEASE, CERVICAL 12/31/2009   [place] DISEASE, LUMBAR 12/31/2009   DIVERTICULITIS, HX OF 12/31/2009   Essential hypertension    Gait abnormality 03/25/2017   GLUCOSE INTOLERANCE 12/31/2009   Habitual alcohol use    History of stroke    self reports " they say i have had some pin strokes"    Hypercalcemia    Hyperkalemia    Hyperlipidemia 12/31/2009   Hypothyroidism    MENOPAUSE, EARLY 12/31/2009   NSTEMI (non-ST elevated myocardial infarction) ([place]) 05/11/2015   Orthostatic hypotension    OSTEOARTHRITIS, HAND    Pacemaker 01/21/2012   MDT Adapta dual chamber   Paroxysmal atrial flutter ([place])    Persistent atrial fibrillation ([place])  PVD (peripheral vascular disease), hx stents to bil SFAs 02/2010    Rash, skin     superficial raised red pencil point sized rash bilateral forearms; states " it started when i started the Plavix "    Ruptured left breast implant    Symptomatic sinus bradycardia 01/22/2012   Syncope    a. 11/2015 ? due to medications.   Syncope    reports on 05-10-17 " i passed out 2 months ago in the bathroom and broke some ribs"     Tachy-brady syndrome ([place])    a. s/p MDT PPM 2013.   Tricuspid regurgitation     Current Outpatient Medications on File Prior to Visit  Medication Sig Dispense Refill   amLODipine (NORVASC) 5 MG tablet Take 1 tablet (5 mg total) by mouth daily. 90 tablet 1   ascorbic acid (VITAMIN C) 250 MG CHEW Chew 750 mg by mouth daily.     aspirin EC 81 MG tablet Take 81 mg by mouth daily.      Biotin 10000 MCG TABS Take 10,000 mcg  by mouth daily.     carvedilol (COREG) 6.25 MG tablet TAKE 1 TABLET BY MOUTH 2 TIMES DAILY WITH A MEAL. 180 tablet 1   Cholecalciferol (VITAMIN D) 50 MCG (2000 UT) tablet Take 2,000 Units by mouth daily.     citalopram (CELEXA) 40 MG tablet TAKE 1 TABLET BY MOUTH EVERY DAY 90 tablet 1   Cyanocobalamin (B-12) 5000 MCG CAPS Take 10,000 mcg by mouth daily.     dofetilide (TIKOSYN) 125 MCG capsule TAKE 1 CAPSULE BY MOUTH EVERY 12 HOURS. (GENERIC FOR TIKOSYN) 60 capsule 0   furosemide (LASIX) 40 MG tablet TAKE 1 TABLET BY MOUTH EVERY OTHER DAY (Patient taking differently: 20 mg as needed (as needed for edema).) 20 tablet 2   furosemide (LASIX) 80 MG tablet Take 1 tablet (80 mg total) by mouth daily. 90 tablet 3   loteprednol (LOTEMAX) 0.5 % ophthalmic suspension Place 2 drops into both eyes See admin instructions. Instill 2 drops into both eyes 2 times a day twice weekly     Magnesium 200 MG TABS Take 200 mg by mouth daily.     Multiple Vitamin (MULTIVITAMIN WITH MINERALS) TABS tablet Take 1 tablet by mouth 2 (two) times daily.     Multiple Vitamins-Minerals (ZINC PO) Take 150 mg by mouth daily.     nitroGLYCERIN (NITROSTAT) 0.4 MG SL tablet Place 1 tablet (0.4 mg total) under the tongue every 5 (five) minutes x 3 doses as needed for chest pain. 25 tablet 2   VITAMIN D-VITAMIN K PO Take 1 capsule by mouth daily.     No current facility-administered medications on file prior to visit.    Allergies  Allergen Reactions   Brilinta [Ticagrelor] Shortness Of Breath   Atorvastatin Other (See Comments)    Possible cause of fatigue/malaise   Clonidine Derivatives Other (See Comments)    Lowers heart rate   Crestor [Rosuvastatin Calcium] Other (See Comments)    Joint/muscle aches   Exforge [Amlodipine Besylate-Valsartan] Itching and Rash   Spironolactone Other (See Comments)    Contraindicated with history of hyperkalemia    Assessment/Plan:  1. Hyperlipidemia -  Problem  Dyslipidemia   Current  Medications: rosuvastatin 10 mg  Intolerances:  rosuvastatin 20 and 40 mg, atorvastatin 80 and 20 mg   Risk Factors: CAD,PVD, hx of stroke, hx of MI in 2016, premature family history of stroke (one brother had stroke at age of 72)  LDL goal: < 55 mg/dl     Dyslipidemia Assessment:  LDL goal: <55 mg/dl last LDLc 68 mg/dl ( 04 /2023) while on rosuvastatin 20 mg  Intolerance to high intensity- muscle aches and muscle cramps  Willing to try low dose rosuvastatin ( moderate intensity  Discussed next potential options (ezetimibe, PCSK-9 inhibitors, bempedoic acid and inclisiran); cost, dosing efficacy, side effects  Given the risk factors PCSK9i would be next reasonable option to add to max tolerated   Patient was concern about high co-pay on brand name mediation   Plan: Start taking rosuvastatin 10 mg daily if not able to tolerate adjust the dose to 5 mg daily  Will apply for PA for PCSK9i; will inform patient upon approval  To avid affordability issue will enroll patient in the [employer].  Lipid lab due in 2-3 months after starting PCSK9i    Thank you,  Cammy Copa, Pharm.D [facility] [address], [place], [place] [address]  Phone: [phone]; Fax: [phone]

## 2022-02-11 ENCOUNTER — Ambulatory Visit: Payer: Self-pay | Admitting: Licensed Clinical Social Worker

## 2022-02-11 LAB — CUP PACEART REMOTE DEVICE CHECK
Battery Impedance: 1892 Ohm
Battery Remaining Longevity: 34 mo
Battery Voltage: 2.75 V
Brady Statistic AP VP Percent: 0 %
Brady Statistic AP VS Percent: 99 %
Brady Statistic AS VP Percent: 0 %
Brady Statistic AS VS Percent: 0 %
Date Time Interrogation Session: 20240103115716
Implantable Lead Connection Status: 753985
Implantable Lead Connection Status: 753985
Implantable Lead Implant Date: 20131212
Implantable Lead Implant Date: 20131212
Implantable Lead Location: 753859
Implantable Lead Location: 753860
Implantable Lead Model: 5076
Implantable Lead Model: 5076
Implantable Pulse Generator Implant Date: 20131212
Lead Channel Impedance Value: 444 Ohm
Lead Channel Impedance Value: 498 Ohm
Lead Channel Pacing Threshold Amplitude: 0.75 V
Lead Channel Pacing Threshold Amplitude: 1 V
Lead Channel Pacing Threshold Pulse Width: 0.4 ms
Lead Channel Pacing Threshold Pulse Width: 0.4 ms
Lead Channel Setting Pacing Amplitude: 2 V
Lead Channel Setting Pacing Amplitude: 2.5 V
Lead Channel Setting Pacing Pulse Width: 0.4 ms
Lead Channel Setting Sensing Sensitivity: 5.6 mV
Zone Setting Status: 755011
Zone Setting Status: 755011

## 2022-02-11 NOTE — Patient Outreach (Signed)
  Care Coordination  Follow Up Visit Note   02/11/2022 Name: [name] MRN: [id number] DOB: 08-19-1940  [name] is a 82 y.o. year old female who sees Sagardia, [name] Bloomer, MD for primary care. I spoke with  [name] by phone today.  What matters to the patients health and wellness today?   Patient is experiencing symptoms of  depression which seems to be exacerbated by not being able to do the things she use to do. Recommendation: Patient may benefit from, and is in agreement to : set personal goals and work on finding her new normal. .    Goals Addressed             This Visit's Progress    Care Coordination Activities mental health support       Care Coordination Interventions: Discussed Goal setting:  walking, trip to [place] and [employer] for fitness Solution-Focused Strategies employed:  Active listening / Reflection utilized  Emotional Support Provided Behavioral Activation reviewed Problem [place] strategies reviewed Provided general psycho-education for mental health needs  Participation in counseling encouraged : Not interested at this time           SDOH assessments and interventions completed:  Yes  SDOH Interventions Today    Flowsheet Row Most Recent Value  SDOH Interventions   Financial Strain Interventions Intervention Not Indicated       Care Coordination Interventions:  Yes, provided   Follow up plan: Follow up call scheduled for 3 weeks    Encounter Outcome:  Pt. Visit Completed   [name], [place] [phone]

## 2022-02-11 NOTE — Patient Instructions (Signed)
Visit Information  Thank you for taking time to visit with me today. Please don't hesitate to contact me if I can be of assistance to you.   Following are the goals we discussed today:   Goals Addressed             This Visit's Progress    Care Coordination Activities mental health support       Care Coordination Interventions: Discussed Goal setting:  walking, trip to [place] and [employer] for fitness Solution-Focused Strategies employed:  Active listening / Reflection utilized  Emotional Support Provided Behavioral Activation reviewed Problem [place] strategies reviewed Provided general psycho-education for mental health needs  Participation in counseling encouraged : Not interested at this time           Our next appointment is by telephone on Jan. 22nd at 9:30  Please call the care guide team at [phone] if you need to cancel or reschedule your appointment.   If you are experiencing a Mental Health or [place] or need someone to talk to, please call the Suicide and Crisis Lifeline: 988 call the [place] National Suicide Prevention Lifeline: 217-210-4621 or TTY: (843) 450-3198 TTY 859-874-2534) to talk to a trained counselor call 1-800-273-TALK (toll free, 24 hour hotline)   Patient verbalizes understanding of instructions and care plan provided today and agrees to view in [place]. Active MyChart status and patient understanding of how to access instructions and care plan via MyChart confirmed with patient.     [name], [place] [phone]

## 2022-02-13 ENCOUNTER — Telehealth: Payer: Self-pay | Admitting: Pharmacist

## 2022-02-13 MED ORDER — REPATHA SURECLICK 140 MG/ML ~~LOC~~ SOAJ: 140.0000 mg | SUBCUTANEOUS | 3 refills | Status: DC

## 2022-02-13 NOTE — Telephone Encounter (Signed)
PA for [place] has been approved, 02/09/2023 Enrolled patient in the [place]. [id number]   CARD STATUS Active   BIN [id number]   PCN [id number]   PC GROUP [id number]  Spoke to [facility], co-pay went to $0 per [place] Patient has been informed.

## 2022-02-24 ENCOUNTER — Encounter: Payer: Self-pay | Admitting: Cardiovascular Disease

## 2022-02-24 DIAGNOSIS — E785 Hyperlipidemia, unspecified: Secondary | ICD-10-CM

## 2022-02-24 DIAGNOSIS — I5042 Chronic combined systolic (congestive) and diastolic (congestive) heart failure: Secondary | ICD-10-CM

## 2022-02-24 DIAGNOSIS — I25708 Atherosclerosis of coronary artery bypass graft(s), unspecified, with other forms of angina pectoris: Secondary | ICD-10-CM

## 2022-02-24 DIAGNOSIS — I10 Essential (primary) hypertension: Secondary | ICD-10-CM

## 2022-02-27 ENCOUNTER — Telehealth: Payer: Self-pay

## 2022-02-27 NOTE — Telephone Encounter (Signed)
Called to discuss PREP class referral; she would like to attend at [facility], the 03/24/22 class every T/Th 2-3:15; will contact first of Feb.to set up assessment visit.

## 2022-03-02 ENCOUNTER — Telehealth: Payer: Self-pay | Admitting: Cardiovascular Disease

## 2022-03-02 ENCOUNTER — Ambulatory Visit: Payer: Self-pay | Admitting: Licensed Clinical Social Worker

## 2022-03-02 ENCOUNTER — Encounter: Payer: Self-pay | Admitting: Cardiovascular Disease

## 2022-03-02 ENCOUNTER — Other Ambulatory Visit: Payer: Self-pay | Admitting: Cardiovascular Disease

## 2022-03-02 NOTE — Patient Instructions (Signed)
Visit Information  Thank you for taking time to visit with me today. Please don't hesitate to contact me if I can be of assistance to you.   Following are the goals we discussed today:   Goals Addressed             This Visit's Progress    Care Coordination Activities mental health support       Care Coordination Interventions: Discussed Goal setting:  walking, trip to [place] and [employer] for fitness PHQ2/ PHQ9 completed Solution-Focused Strategies employed:  Active listening / Reflection utilized  Behavioral Activation reviewed Problem [place] strategies reviewed Provided general psycho-education for mental health needs  Reviewed mental health medications and discussed importance of compliance: continues to take '40mg'$  of Celexa Suicidal Ideation/Homicidal Ideation assessed: denied Discussed mental health treatment options/ Collaborated with PCP who is in agreement with psychiatry evaluation             Our next appointment is by telephone on 03/09/22 at 9:30  Please call the care guide team at [phone] if you need to cancel or reschedule your appointment.   If you are experiencing a Mental Health or [place] or need someone to talk to, please call the Suicide and Crisis Lifeline: 988 call the [place] National Suicide Prevention Lifeline: 539-073-9035 or TTY: (805) 437-1056 TTY 406-696-3007) to talk to a trained counselor call 1-800-273-TALK (toll free, 24 hour hotline) go to [facility] [address] [place] [phone])   Patient verbalizes understanding of instructions and care plan provided today and agrees to view in [place]. Active MyChart status and patient understanding of how to access instructions and care plan via MyChart confirmed with patient.     [name], [place] [phone]

## 2022-03-02 NOTE — Telephone Encounter (Signed)
*  STAT* If patient is at the pharmacy, call can be transferred to refill team.   1. Which medications need to be refilled? (please list name of each medication and dose if known) dofetilide (TIKOSYN) 125 MCG capsule   2. Which pharmacy/location (including street and city if local pharmacy) is medication to be sent to? [place], [address]   3. Do they need a 30 day or 90 day supply? [place]

## 2022-03-02 NOTE — Telephone Encounter (Signed)
Yes please

## 2022-03-02 NOTE — Patient Outreach (Signed)
  Care Coordination  Follow Up Visit Note   03/02/2022 Name: [name] MRN: [id number] DOB: 06-Aug-1940  [name] is a 82 y.o. year old female who sees Sagardia, Ines Bloomer, MD for primary care. I spoke with  [name] by phone today.  What matters to the patients health and wellness today?    Patient is making progress with and has located a walking buddy, goes to church and will start exercise at the [facility] , but continues to have difficulty with managing symptom of depression. Reports she feels like there is a web in front of her holding her back from being the person that she wants to be. Recommendation: Patient may benefit from, and is in agreement to : explore options for psychiatry evaluations. .       03/02/2022    9:50 AM 11/10/2021    4:25 PM 10/07/2021    2:00 PM  Depression screen PHQ 2/9  Decreased Interest 3  3  Down, Depressed, Hopeless   3  PHQ - 2 Score 3  6  Altered sleeping 2  3  Tired, decreased energy 3  0  Change in appetite 3  0  Feeling bad or failure about yourself  1  1  Trouble concentrating 3  0  Moving slowly or fidgety/restless 0  0  Suicidal thoughts 1  3  PHQ-9 Score 16  13  Difficult doing work/chores Somewhat difficult       Information is confidential and restricted. Go to Review Flowsheets to unlock data.     Goals Addressed             This Visit's Progress    Care Coordination Activities mental health support       Care Coordination Interventions: Discussed Goal setting:  walking, trip to [place] and [employer] for fitness PHQ2/ PHQ9 completed Solution-Focused Strategies employed:  Active listening / Reflection utilized  Behavioral Activation reviewed Problem [place] strategies reviewed Provided general psycho-education for mental health needs  Reviewed mental health medications and discussed importance of compliance: continues to take '40mg'$  of Celexa Suicidal Ideation/Homicidal Ideation  assessed: denied Discussed mental health treatment options/ Collaborated with PCP who is in agreement with psychiatry evaluation             SDOH assessments and interventions completed:  Yes  SDOH Interventions Today    Flowsheet Row Most Recent Value  SDOH Interventions   Depression Interventions/Treatment  Medication  Stress Interventions Intervention Not Indicated       Care Coordination Interventions:  Yes, provided   Follow up plan: Follow up call scheduled for 03/09/22    Encounter Outcome:  Pt. Visit Completed   [name], [place] [phone]

## 2022-03-03 MED ORDER — DOFETILIDE 125 MCG PO CAPS
ORAL_CAPSULE | ORAL | 0 refills | Status: DC
Start: 2022-03-03 — End: 2022-03-03

## 2022-03-03 MED ORDER — DOFETILIDE 125 MCG PO CAPS: ORAL_CAPSULE | ORAL | 3 refills | Status: DC

## 2022-03-04 DIAGNOSIS — R509 Fever, unspecified: Secondary | ICD-10-CM | POA: Diagnosis not present

## 2022-03-06 NOTE — Progress Notes (Signed)
Remote pacemaker transmission.   

## 2022-03-09 ENCOUNTER — Ambulatory Visit: Payer: Self-pay | Admitting: Licensed Clinical Social Worker

## 2022-03-09 MED ORDER — DOFETILIDE 125 MCG PO CAPS: ORAL_CAPSULE | ORAL | 3 refills | Status: DC

## 2022-03-09 NOTE — Patient Outreach (Signed)
  Care Coordination   Follow Up Visit Note   03/09/2022 Name: Alonni Heimsoth MRN: [id number] DOB: [birth year]  Peyton Bottoms Wahler is a 82 y.o. year old female who sees Sagardia, Ines Bloomer, MD for primary care. I spoke with  [name] by phone today.  What matters to the patients health and wellness today?    Patient is making progress with managing her mental health.  She is getting out more, going to see family, going to church and continues doing word games. She is not interested in connecting for therapy or psychiatry for medication management at this time   Recommendation: Patient may benefit from, and is in agreement to keep all upcoming medical appointments and continue to work with LCSW to assist with coping skills and goals related to mental health.   Goals Addressed             This Visit's Progress    Care Coordination Activities mental health support       Care Coordination Interventions: Discussed Goal setting:   Solution-Focused Strategies employed:  Active listening / Reflection utilized  Behavioral Activation reviewed Problem [place] strategies reviewed Provided general psycho-education for mental health needs  Discussed referral for psychiatry: would like to wait at this time not ready to move forward Will start exercise program at the [facility] the week of Feb 15th          SDOH assessments and interventions completed:  No   Care Coordination Interventions:  Yes, provided   Follow up plan: Follow up call scheduled for 03/25/22    Encounter Outcome:  Pt. Visit Completed   [name], [place] [phone]

## 2022-03-09 NOTE — Patient Instructions (Signed)
Visit Information  Thank you for taking time to visit with me today. Please don't hesitate to contact me if I can be of assistance to you.   Following are the goals we discussed today:   Goals Addressed             This Visit's Progress    Care Coordination Activities mental health support       Care Coordination Interventions: Discussed Goal setting:   Solution-Focused Strategies employed:  Active listening / Reflection utilized  Behavioral Activation reviewed Problem [place] strategies reviewed Provided general psycho-education for mental health needs  Discussed referral for psychiatry: would like to wait at this time not ready to move forward Will start exercise program at the [facility] the week of Feb 15th          Our next appointment is by telephone on 03/25/22 at 9:30  Please call the care guide team at [phone] if you need to cancel or reschedule your appointment.   If you are experiencing a Mental Health or [place] or need someone to talk to, please call the Suicide and Crisis Lifeline: 988 call the [place] National Suicide Prevention Lifeline: 8044897971 or TTY: 337-152-5362 TTY 817-597-2056) to talk to a trained counselor call 1-800-273-TALK (toll free, 24 hour hotline) go to [facility] [address], [place] [phone])   Patient verbalizes understanding of instructions and care plan provided today and agrees to view in [place]. Active MyChart status and patient understanding of how to access instructions and care plan via MyChart confirmed with patient.     [name], [place] [phone]

## 2022-03-11 ENCOUNTER — Telehealth: Payer: Self-pay

## 2022-03-11 NOTE — Telephone Encounter (Signed)
Called to confirm participation in next PREP class at [facility] on 2/13; assessment visit scheduled for 2/8 at 2pm

## 2022-03-17 ENCOUNTER — Ambulatory Visit (INDEPENDENT_AMBULATORY_CARE_PROVIDER_SITE_OTHER): Payer: Medicare HMO | Admitting: Emergency Medicine

## 2022-03-17 ENCOUNTER — Encounter: Payer: Self-pay | Admitting: Emergency Medicine

## 2022-03-17 VITALS — BP 120/62 | HR 87 | Temp 98.4°F | Ht 65.0 in | Wt 147.4 lb

## 2022-03-17 DIAGNOSIS — J22 Unspecified acute lower respiratory infection: Secondary | ICD-10-CM | POA: Diagnosis not present

## 2022-03-17 NOTE — Assessment & Plan Note (Signed)
Clinically stable.  Afebrile. Normal vital signs.  No red flag signs or symptoms. Unremarkable exam. Finished doxycycline and Medrol Dosepak. Feels 100% better. No need for additional treatment at present time.

## 2022-03-17 NOTE — Progress Notes (Signed)
[name] Labrum 82 y.o.   Chief Complaint  Patient presents with   Follow-up    F/u appt for OV at [facility], patient was out of town and was seen for poss pneumonia     HISTORY OF PRESENT ILLNESS: This is a 82 y.o. female here for follow-up of lower respiratory infection/pneumonia.  Seen at [facility] outpatient clinic on 03/04/2022.  Started on doxycycline and Medrol Dosepak.  Was febrile at the time.  Feeling much better today.  100% better.  HPI   Prior to Admission medications   Medication Sig Start Date End Date Taking? Authorizing Provider  albuterol (VENTOLIN HFA) 108 (90 Base) MCG/ACT inhaler Inhale into the lungs. 03/04/22  Yes [provider]  amLODipine (NORVASC) 5 MG tablet Take 1 tablet (5 mg total) by mouth daily. 11/17/21  Yes Croitoru, Mihai, MD  ascorbic acid (VITAMIN C) 250 MG CHEW Chew 750 mg by mouth daily.   Yes [provider]  aspirin EC 81 MG tablet Take 81 mg by mouth daily.    Yes [provider]  Biotin 10000 MCG TABS Take 10,000 mcg by mouth daily.   Yes [provider]  carvedilol (COREG) 6.25 MG tablet TAKE 1 TABLET BY MOUTH 2 TIMES DAILY WITH A MEAL. 09/08/21  Yes Croitoru, Mihai, MD  Cholecalciferol (VITAMIN D) 50 MCG (2000 UT) tablet Take 2,000 Units by mouth daily.   Yes [provider]  citalopram (CELEXA) 40 MG tablet TAKE 1 TABLET BY MOUTH EVERY DAY 09/08/21  Yes Lorelai Huyser, Ines Bloomer, MD  Cyanocobalamin (B-12) 5000 MCG CAPS Take 10,000 mcg by mouth daily.   Yes [provider]  dofetilide (TIKOSYN) 125 MCG capsule TAKE 1 CAPSULE BY MOUTH EVERY 12 HOURS. (GENERIC FOR TIKOSYN) 03/09/22  Yes Croitoru, Mihai, MD  Evolocumab (REPATHA SURECLICK) 834 MG/ML SOAJ Inject 140 mg into the skin every 14 (fourteen) days. 02/13/22  Yes Croitoru, Mihai, MD  furosemide (LASIX) 40 MG tablet TAKE 1 TABLET BY MOUTH EVERY OTHER DAY Patient taking differently: 20 mg as needed (as needed for edema). 11/13/20  Yes Croitoru, Mihai,  MD  furosemide (LASIX) 80 MG tablet Take 1 tablet (80 mg total) by mouth daily. 10/23/20  Yes Croitoru, Mihai, MD  loteprednol (LOTEMAX) 0.5 % ophthalmic suspension Place 2 drops into both eyes See admin instructions. Instill 2 drops into both eyes 2 times a day twice weekly   Yes [provider]  Magnesium 200 MG TABS Take 200 mg by mouth daily.   Yes [provider]  Multiple Vitamin (MULTIVITAMIN WITH MINERALS) TABS tablet Take 1 tablet by mouth 2 (two) times daily.   Yes [provider]  Multiple Vitamins-Minerals (ZINC PO) Take 150 mg by mouth daily.   Yes [provider]  nitroGLYCERIN (NITROSTAT) 0.4 MG SL tablet Place 1 tablet (0.4 mg total) under the tongue every 5 (five) minutes x 3 doses as needed for chest pain. 05/15/15  Yes Lyda Jester M, PA-C  rosuvastatin (CRESTOR) 10 MG tablet Take 1 tablet (10 mg total) by mouth daily. 02/10/22  Yes Croitoru, Mihai, MD  VITAMIN D-VITAMIN K PO Take 1 capsule by mouth daily.   Yes [provider]    Allergies  Allergen Reactions   Brilinta [Ticagrelor] Shortness Of Breath   Atorvastatin Other (See Comments)    Possible cause of fatigue/malaise   Clonidine Derivatives Other (See Comments)    Lowers heart rate   Crestor [Rosuvastatin Calcium] Other (See Comments)    Joint/muscle aches  Exforge [Amlodipine Besylate-Valsartan] Itching and Rash   Spironolactone Other (See Comments)    Contraindicated with history of hyperkalemia    Patient Active Problem List   Diagnosis Date Noted   Moderate episode of recurrent major depressive disorder ([place]) 10/07/2021   Lack of energy 10/07/2021   Compression fracture of T12 vertebra (HCC) 05/28/2021   Chronic neck pain 04/07/2021   Chronic bilateral low back pain without sciatica 04/07/2021   Exertional angina    Renal artery stenosis (HCC) 06/26/2020   Cervical spine disease 05/30/2020   Statin intolerance 11/22/2019   Aortic atherosclerosis ([place])  11/22/2019   Hypothyroidism (acquired) 02/08/2018   Coronary artery disease involving coronary bypass graft of native heart without angina pectoris 10/22/2017   Dyslipidemia 10/22/2017   Cerebrovascular small vessel disease 04/27/2017   Cerebrovascular disease 03/25/2017   Claudication in peripheral vascular disease ([place]) 02/15/2017   Hyperparathyroidism ([place]) 05/13/2016   Cardiac pacemaker in situ 01/10/2016   Benign neoplasm of ascending colon    Long term current use of anticoagulant    CKD (chronic kidney disease), stage II    Chronic diastolic heart failure ([place]) 10/19/2015   Atypical atrial flutter (HCC)    Paroxysmal atrial flutter ([place]) 08/23/2015   Chronic fatigue 06/27/2015   Stented coronary artery    S/P CABG x 3 01/04/15 01/10/2015   CAD (coronary artery disease) 01/04/2015   PAF (paroxysmal atrial fibrillation) ([place]) 01/22/2012   Sinus node dysfunction ([place]) 01/22/2012   S/P placement of cardiac pacemaker, medtronic adapta 01/21/12 01/22/2012   PVD (peripheral vascular disease), hx stents to bil SFAs 02/2010 01/22/2012   Hyperlipidemia 12/31/2009   Anxiety 12/31/2009   DEPRESSION 12/31/2009   Essential hypertension 12/31/2009   Coronary atherosclerosis 12/31/2009   OSTEOARTHRITIS, HAND 12/31/2009   [place] DISEASE, CERVICAL 12/31/2009   [place] DISEASE, LUMBAR 12/31/2009    Past Medical History:  Diagnosis Date   ANXIETY 12/31/2009   Aortic insufficiency    a. mod by echo 2017.   CAD (coronary artery disease)    a. s/p CABGx3 and LAA clipping in 12/2014, NSTEMI 05/2015 s/p DES to native RCA and SVG-dRCA; occluded ramus-SVG was treated medically). c. neg [facility]uc 10/2015 [facility].   Chronic diastolic CHF (congestive heart failure) (HCC)    Chronic fatigue    CKD (chronic kidney disease), stage II    DEPRESSION 12/31/2009   [place] DISEASE, CERVICAL 12/31/2009   [place] DISEASE, LUMBAR 12/31/2009   DIVERTICULITIS, HX OF 12/31/2009   Essential hypertension    Gait abnormality  03/25/2017   GLUCOSE INTOLERANCE 12/31/2009   Habitual alcohol use    History of stroke    self reports " they say i have had some pin strokes"    Hypercalcemia    Hyperkalemia    Hyperlipidemia 12/31/2009   Hypothyroidism    MENOPAUSE, EARLY 12/31/2009   NSTEMI (non-ST elevated myocardial infarction) ([place]) 05/11/2015   Orthostatic hypotension    OSTEOARTHRITIS, HAND    Pacemaker 01/21/2012   MDT Adapta dual chamber   Paroxysmal atrial flutter ([place])    Persistent atrial fibrillation (HCC)    PVD (peripheral vascular disease), hx stents to bil SFAs 02/2010    Rash, skin     superficial raised red pencil point sized rash bilateral forearms; states " it started when i started the Plavix "    Ruptured left breast implant    Symptomatic sinus bradycardia 01/22/2012   Syncope    a. 11/2015 ? due to medications.   Syncope  reports on 05-10-17 " i passed out 2 months ago in the bathroom and broke some ribs"     Tachy-brady syndrome ([place])    a. s/p MDT PPM 2013.   Tricuspid regurgitation     Past Surgical History:  Procedure Laterality Date   ABDOMINAL AORTOGRAM W/LOWER EXTREMITY Bilateral 02/15/2017   Procedure: ABDOMINAL AORTOGRAM W/LOWER EXTREMITY;  Surgeon: Lorretta Harp, MD;  Location: [place] [facility];  Service: Cardiovascular;  Laterality: Bilateral;  bilat   ABDOMINAL AORTOGRAM W/LOWER EXTREMITY N/A 10/06/2018   Procedure: ABDOMINAL AORTOGRAM W/LOWER EXTREMITY;  Surgeon: Lorretta Harp, MD;  Location: [place] [facility];  Service: Cardiovascular;  Laterality: N/A;   Ablation of typical atrial flutter (CTI line)  09/05/2016   Dr Antonieta Pert at [place]   Removed 01/2018   BREAST IMPLANT REMOVAL Bilateral 01/14/2018   Procedure: REMOVAL BILATERAL BREAST IMPLANTS;  Surgeon: Irene Limbo, MD;  Location: [place];  Service: Plastics;  Laterality: Bilateral;   CAPSULECTOMY Bilateral 01/14/2018   Procedure: CAPSULECTOMY;   Surgeon: Irene Limbo, MD;  Location: [place];  Service: Plastics;  Laterality: Bilateral;   CARDIAC CATHETERIZATION N/A 01/01/2015   Procedure: Left Heart Cath and Coronary Angiography;  Surgeon: Jettie Booze, MD;  Location: [place] [facility];  Service: Cardiovascular;  Laterality: N/A;   CARDIAC CATHETERIZATION N/A 05/12/2015   Procedure: Left Heart Cath and Coronary Angiography;  Surgeon: Troy Sine, MD;  Location: [place] [facility];  Service: Cardiovascular;  Laterality: N/A;   CARDIAC CATHETERIZATION N/A 05/12/2015   Procedure: Coronary Stent Intervention;  Surgeon: Troy Sine, MD;  Location: [place] [facility];  Service: Cardiovascular;  Laterality: N/A;   CARDIOVERSION N/A 02/01/2015   Procedure: CARDIOVERSION;  Surgeon: Lelon Perla, MD;  Location: [facility] ENDOSCOPY;  Service: Cardiovascular;  Laterality: N/A;   CARDIOVERSION N/A 08/30/2015   Procedure: CARDIOVERSION;  Surgeon: Sanda Klein, MD;  Location: [place] ENDOSCOPY;  Service: Cardiovascular;  Laterality: N/A;   CARPAL TUNNEL RELEASE  2008   "right hand/thumb; carpal tunnel repair; got rid of arthritis" (01/21/2012)   CLIPPING OF ATRIAL APPENDAGE N/A 01/04/2015   Procedure: CLIPPING OF ATRIAL APPENDAGE;  Surgeon: Grace Isaac, MD;  Location: [place];  Service: Open Heart Surgery;  Laterality: N/A;   COLONOSCOPY N/A 12/21/2015   Procedure: COLONOSCOPY;  Surgeon: Milus Banister, MD;  Location: [facility] ENDOSCOPY;  Service: Endoscopy;  Laterality: N/A;   CORONARY ARTERY BYPASS GRAFT N/A 01/04/2015   Procedure: CORONARY ARTERY BYPASS GRAFTING (CABG) x 3 using left internal mammory artery and greater saphenous vein right leg harvested endoscopically.;  Surgeon: Grace Isaac, MD;  LIMA-LAD, SVG-RI, SVG-PDA   CORONARY STENT INTERVENTION N/A 11/07/2020   Procedure: CORONARY STENT INTERVENTION;  Surgeon: Early Osmond, MD;  Location: [place] [facility];  Service: Cardiovascular;  Laterality: N/A;    ESOPHAGOGASTRODUODENOSCOPY (EGD) WITH PROPOFOL N/A 12/18/2015   Procedure: ESOPHAGOGASTRODUODENOSCOPY (EGD) WITH PROPOFOL;  Surgeon: Milus Banister, MD;  Location: [facility];  Service: Endoscopy;  Laterality: N/A;   FACELIFT, LOWER 2/3  1995   "mini" (01/21/2012)   GIVENS CAPSULE STUDY N/A 12/21/2015   Procedure: GIVENS CAPSULE STUDY;  Surgeon: Milus Banister, MD;  Location: [facility];  Service: Endoscopy;  Laterality: N/A;   LEFT HEART CATH AND CORS/GRAFTS ANGIOGRAPHY N/A 11/07/2020   Procedure: LEFT HEART CATH AND CORS/GRAFTS ANGIOGRAPHY;  Surgeon: Early Osmond, MD;  Location: [place] [facility];  Service: Cardiovascular;  Laterality: N/A;   Lower Arterial Examination  10/28/2011   R. SFA stent mild-moderate mixed density plaque with elevated velocities consistent with 50% diameter reduction. L. SFA stent moderate mixed denisty plaque at mid to distal level consistent with 50-69% diameter reduction.   LOWER EXTREMITY ANGIOGRAPHY N/A 02/15/2017   Procedure: LOWER EXTREMITY ANGIOGRAPHY;  Surgeon: Lorretta Harp, MD;  Location: [place] [facility];  Service: Cardiovascular;  Laterality: N/A;   OOPHORECTOMY  ~1979   PARATHYROIDECTOMY N/A 05/13/2017   Procedure: PARATHYROIDECTOMY;  Surgeon: Armandina Gemma, MD;  Location: [facility];  Service: General;  Laterality: N/A;   PARTIAL COLECTOMY  2010   PERIPHERAL ARTERIAL STENT GRAFT  2012; 2012   "LLE; RLE" (01/21/2012)   PERIPHERAL VASCULAR BALLOON ANGIOPLASTY Right 02/15/2017   Procedure: PERIPHERAL VASCULAR BALLOON ANGIOPLASTY;  Surgeon: Lorretta Harp, MD;  Location: [place] [facility];  Service: Cardiovascular;  Laterality: Right;  SFA     PERIPHERAL VASCULAR INTERVENTION Right 10/06/2018   Procedure: PERIPHERAL VASCULAR INTERVENTION;  Surgeon: Lorretta Harp, MD;  Location: [place] [facility];  Service: Cardiovascular;  Laterality: Right;   PERMANENT PACEMAKER INSERTION N/A 01/21/2012   Medtronic Adapta L implanted by Dr Sallyanne Kuster for  tachy/brady syndrome   POSTERIOR CERVICAL LAMINECTOMY  1985   TEE WITHOUT CARDIOVERSION N/A 01/04/2015   Procedure: TRANSESOPHAGEAL ECHOCARDIOGRAM (TEE);  Surgeon: Grace Isaac, MD;  Location: [place];  Service: Open Heart Surgery;  Laterality: N/A;   TEE WITHOUT CARDIOVERSION N/A 02/01/2015   Procedure: TRANSESOPHAGEAL ECHOCARDIOGRAM (TEE);  Surgeon: Lelon Perla, MD;  Location: [facility] ENDOSCOPY;  Service: Cardiovascular;  Laterality: N/A;   TEE WITHOUT CARDIOVERSION N/A 08/30/2015   Procedure: TRANSESOPHAGEAL ECHOCARDIOGRAM (TEE);  Surgeon: Sanda Klein, MD;  Location: [place];  Service: Cardiovascular;  Laterality: N/A;   VAGINAL HYSTERECTOMY  1975    Social History   Socioeconomic History   Marital status: Widowed    Spouse name: Not on file   Number of children: 3   Years of education: Not on file   Highest education level: Not on file  Occupational History   Occupation: Retired Teacher, 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  Retired.   Social Determinants of Health   Financial Resource Strain: Low Risk  (02/11/2022)   Overall Financial Resource Strain (CARDIA)    Difficulty of Paying Living Expenses: Not very hard  Food Insecurity: No Food Insecurity (01/28/2022)   Hunger Vital Sign    Worried About Running Out of Food in the Last Year: Never true    Ran Out of Food in the Last Year: Never true  Transportation Needs: No Transportation Needs (01/28/2022)   PRAPARE -  Hydrologist (Medical): No    Lack of Transportation (Non-Medical): No  Physical Activity: Not on file  Stress: No  Stress Concern Present (03/02/2022)   [place]    Feeling of Stress : Not at all  Social Connections: Not on file  Intimate Partner Violence: Not on file    Family History  Problem Relation Age of Onset   Heart disease Mother    Hypertension Mother    Stroke Mother    Stroke Father    Heart disease Father    Heart disease Brother    Hypertension Brother        2 brothers   CVA Brother        2 brothers   Heart attack Brother        2 brothers     Review of Systems  Constitutional: Negative.  Negative for chills and fever.  HENT: Negative.  Negative for congestion and sore throat.   Respiratory: Negative.  Negative for cough and shortness of breath.   Cardiovascular: Negative.  Negative for chest pain and palpitations.  Gastrointestinal:  Negative for abdominal pain, diarrhea, nausea and vomiting.  Skin: Negative.  Negative for rash.  Neurological: Negative.  Negative for dizziness and headaches.  All other systems reviewed and are negative.   Today's Vitals   03/17/22 1502  BP: 120/62  Pulse: 87  Temp: 98.4 F (36.9 C)  TempSrc: Oral  SpO2: 93%  Weight: 147 lb 6 oz (66.8 kg)  Height: '5\' 5"'$  (1.651 m)   Body mass index is 24.52 kg/m.  Physical Exam Vitals reviewed.  Constitutional:      Appearance: Normal appearance.  HENT:     Head: Normocephalic.     Mouth/Throat:     Mouth: Mucous membranes are moist.     Pharynx: Oropharynx is clear.  Eyes:     Extraocular Movements: Extraocular movements intact.     Pupils: Pupils are equal, round, and reactive to light.  Cardiovascular:     Rate and Rhythm: Normal rate and regular rhythm.     Pulses: Normal pulses.     Heart sounds: Normal heart sounds.  Pulmonary:     Effort: Pulmonary effort is normal.      Breath sounds: Normal breath sounds.  Musculoskeletal:     Cervical back: No tenderness.  Lymphadenopathy:     Cervical: No cervical adenopathy.  Skin:    General: Skin is warm and dry.  Neurological:     General: No focal deficit present.     Mental Status: She is alert and oriented to person, place, and time.  Psychiatric:        Mood and Affect: Mood normal.        Behavior: Behavior normal.      ASSESSMENT & PLAN: A total of 33 minutes was spent with the patient and counseling/coordination of care regarding preparing for this visit, review of most recent office visit notes, review of discharge summary from recent [facility] outpatient clinic visit, review of chronic medical conditions under management, review of all medications, recent diagnosis of lower respiratory infection, prognosis, documentation, and need for follow-up if no better or worse during the next several days..  Problem List Items Addressed This Visit       Respiratory   Lower respiratory infection - Primary    Clinically stable.  Afebrile. Normal vital signs.  No red flag signs or symptoms. Unremarkable exam. Finished doxycycline and Medrol Dosepak. Feels 100% better. No need for additional treatment at present time.      Patient Instructions  Health Maintenance After  Age 82 After age 69, you are at a higher risk for certain long-term diseases and infections as well as injuries from falls. Falls are a major cause of broken bones and head injuries in people who are older than age 61. Getting regular preventive care can help to keep you healthy and well. Preventive care includes getting regular testing and making lifestyle changes as recommended by your health care provider. Talk with your health care provider about: Which screenings and tests you should have. A screening is a test that checks for a disease when you have no symptoms. A diet and exercise plan that is right for you. What should I know about  screenings and tests to prevent falls? Screening and testing are the best ways to find a health problem early. Early diagnosis and treatment give you the best chance of managing medical conditions that are common after age 18. Certain conditions and lifestyle choices may make you more likely to have a fall. Your health care provider may recommend: Regular vision checks. Poor vision and conditions such as cataracts can make you more likely to have a fall. If you wear glasses, make sure to get your prescription updated if your vision changes. Medicine review. Work with your health care provider to regularly review all of the medicines you are taking, including over-the-counter medicines. Ask your health care provider about any side effects that may make you more likely to have a fall. Tell your health care provider if any medicines that you take make you feel dizzy or sleepy. Strength and balance checks. Your health care provider may recommend certain tests to check your strength and balance while standing, walking, or changing positions. Foot health exam. Foot pain and numbness, as well as not wearing proper footwear, can make you more likely to have a fall. Screenings, including: Osteoporosis screening. Osteoporosis is a condition that causes the bones to get weaker and break more easily. Blood pressure screening. Blood pressure changes and medicines to control blood pressure can make you feel dizzy. Depression screening. You may be more likely to have a fall if you have a fear of falling, feel depressed, or feel unable to do activities that you used to do. Alcohol use screening. Using too much alcohol can affect your balance and may make you more likely to have a fall. Follow these instructions at home: Lifestyle Do not drink alcohol if: Your health care provider tells you not to drink. If you drink alcohol: Limit how much you have to: 0-1 drink a day for women. 0-2 drinks a day for men. Know how  much alcohol is in your drink. In the [place] one drink equals one 12 oz bottle of beer (355 mL), one 5 oz glass of wine (148 mL), or one 1 oz glass of hard liquor (44 mL). Do not use any products that contain nicotine or tobacco. These products include cigarettes, chewing tobacco, and vaping devices, such as e-cigarettes. If you need help quitting, ask your health care provider. Activity  Follow a regular exercise program to stay fit. This will help you maintain your balance. Ask your health care provider what types of exercise are appropriate for you. If you need a cane or walker, use it as recommended by your health care provider. Wear supportive shoes that have nonskid soles. Safety  Remove any tripping hazards, such as rugs, cords, and clutter. Install safety equipment such as grab bars in bathrooms and safety rails on stairs. Keep rooms and walkways well-lit. General  instructions Talk with your health care provider about your risks for falling. Tell your health care provider if: You fall. Be sure to tell your health care provider about all falls, even ones that seem minor. You feel dizzy, tiredness (fatigue), or off-balance. Take over-the-counter and prescription medicines only as told by your health care provider. These include supplements. Eat a healthy diet and maintain a healthy weight. A healthy diet includes low-fat dairy products, low-fat (lean) meats, and fiber from whole grains, beans, and lots of fruits and vegetables. Stay current with your vaccines. Schedule regular health, dental, and eye exams. Summary Having a healthy lifestyle and getting preventive care can help to protect your health and wellness after age 92. Screening and testing are the best way to find a health problem early and help you avoid having a fall. Early diagnosis and treatment give you the best chance for managing medical conditions that are more common for people who are older than age 50. Falls are a  major cause of broken bones and head injuries in people who are older than age 17. Take precautions to prevent a fall at home. Work with your health care provider to learn what changes you can make to improve your health and wellness and to prevent falls. This information is not intended to replace advice given to you by your health care provider. Make sure you discuss any questions you have with your health care provider. Document Revised: 06/17/2020 Document Reviewed: 06/17/2020 Elsevier Patient Education  [place], MD [facility]

## 2022-03-17 NOTE — Patient Instructions (Signed)
Health Maintenance After Age 82 After age 82, you are at a higher risk for certain long-term diseases and infections as well as injuries from falls. Falls are a major cause of broken bones and head injuries in people who are older than age 82. Getting regular preventive care can help to keep you healthy and well. Preventive care includes getting regular testing and making lifestyle changes as recommended by your health care provider. Talk with your health care provider about: Which screenings and tests you should have. A screening is a test that checks for a disease when you have no symptoms. A diet and exercise plan that is right for you. What should I know about screenings and tests to prevent falls? Screening and testing are the best ways to find a health problem early. Early diagnosis and treatment give you the best chance of managing medical conditions that are common after age 82. Certain conditions and lifestyle choices may make you more likely to have a fall. Your health care provider may recommend: Regular vision checks. Poor vision and conditions such as cataracts can make you more likely to have a fall. If you wear glasses, make sure to get your prescription updated if your vision changes. Medicine review. Work with your health care provider to regularly review all of the medicines you are taking, including over-the-counter medicines. Ask your health care provider about any side effects that may make you more likely to have a fall. Tell your health care provider if any medicines that you take make you feel dizzy or sleepy. Strength and balance checks. Your health care provider may recommend certain tests to check your strength and balance while standing, walking, or changing positions. Foot health exam. Foot pain and numbness, as well as not wearing proper footwear, can make you more likely to have a fall. Screenings, including: Osteoporosis screening. Osteoporosis is a condition that causes  the bones to get weaker and break more easily. Blood pressure screening. Blood pressure changes and medicines to control blood pressure can make you feel dizzy. Depression screening. You may be more likely to have a fall if you have a fear of falling, feel depressed, or feel unable to do activities that you used to do. Alcohol use screening. Using too much alcohol can affect your balance and may make you more likely to have a fall. Follow these instructions at home: Lifestyle Do not drink alcohol if: Your health care provider tells you not to drink. If you drink alcohol: Limit how much you have to: 0-1 drink a day for women. 0-2 drinks a day for men. Know how much alcohol is in your drink. In the [place] one drink equals one 12 oz bottle of beer (355 mL), one 5 oz glass of wine (148 mL), or one 1 oz glass of hard liquor (44 mL). Do not use any products that contain nicotine or tobacco. These products include cigarettes, chewing tobacco, and vaping devices, such as e-cigarettes. If you need help quitting, ask your health care provider. Activity  Follow a regular exercise program to stay fit. This will help you maintain your balance. Ask your health care provider what types of exercise are appropriate for you. If you need a cane or walker, use it as recommended by your health care provider. Wear supportive shoes that have nonskid soles. Safety  Remove any tripping hazards, such as rugs, cords, and clutter. Install safety equipment such as grab bars in bathrooms and safety rails on stairs. Keep rooms and walkways   well-lit. General instructions Talk with your health care provider about your risks for falling. Tell your health care provider if: You fall. Be sure to tell your health care provider about all falls, even ones that seem minor. You feel dizzy, tiredness (fatigue), or off-balance. Take over-the-counter and prescription medicines only as told by your health care provider. These include  supplements. Eat a healthy diet and maintain a healthy weight. A healthy diet includes low-fat dairy products, low-fat (lean) meats, and fiber from whole grains, beans, and lots of fruits and vegetables. Stay current with your vaccines. Schedule regular health, dental, and eye exams. Summary Having a healthy lifestyle and getting preventive care can help to protect your health and wellness after age 82. Screening and testing are the best way to find a health problem early and help you avoid having a fall. Early diagnosis and treatment give you the best chance for managing medical conditions that are more common for people who are older than age 82. Falls are a major cause of broken bones and head injuries in people who are older than age 82. Take precautions to prevent a fall at home. Work with your health care provider to learn what changes you can make to improve your health and wellness and to prevent falls. This information is not intended to replace advice given to you by your health care provider. Make sure you discuss any questions you have with your health care provider. Document Revised: 06/17/2020 Document Reviewed: 06/17/2020 Elsevier Patient Education  2023 Elsevier Inc.  

## 2022-03-19 NOTE — Progress Notes (Signed)
[facility] PREP Evaluation  Patient Details  Name: [name] MRN: [id number] Date of Birth: 03-06-1940 Age: 82 y.o. PCP: Horald Pollen, MD  Vitals:   03/19/22 1430  BP: 132/60  Pulse: 84  SpO2: 95%  Weight: 149 lb (67.6 kg)     [facility] Eval - 03/19/22 1400       [facility] "PREP" Location   [facility] "PREP" Location [facility]      Referral    Referring Provider [place]    Reason for referral High Cholesterol;Hypertension    Program Start Date 03/24/22      Measurement   Waist Circumference 37.5 inches    Hip Circumference 40.5 inches    Body fat 38.4 percent      Information for Trainer   Goals --   Establish exercise/strenth training program   Current Exercise walking    Orthopedic Concerns --   s/p compression fx 20223; hip pain   Pertinent Medical History --   high cholesterol, HTN, Afid, CAD s/p CABG; CKD II   Medications that affect exercise Beta blocker      Timed Up and Go (TUGS)   Timed Up and Go Low risk <9 seconds      Mobility and Daily Activities   I find it easy to walk up or down two or more flights of stairs. 1    I have no trouble taking out the trash. 4    I do housework such as vacuuming and dusting on my own without difficulty. 3    I can easily lift a gallon of milk (8lbs). 4    I can easily walk a mile. 1    I have no trouble reaching into high cupboards or reaching down to pick up something from the floor. 4    I do not have trouble doing out-door work such as Armed forces logistics/support/administrative officer, raking leaves, or gardening. 1      Mobility and Daily Activities   I feel younger than my age. 4    I feel independent. 4    I feel energetic. 1    I live an active life.  1    I feel strong. 1    I feel healthy. 2    I feel active as other people my age. 4      How fit and strong are you.   Fit and Strong Total Score 35            Past Medical History:  Diagnosis Date   ANXIETY 12/31/2009   Aortic insufficiency    a. mod by echo 2017.   CAD  (coronary artery disease)    a. s/p CABGx3 and LAA clipping in 12/2014, NSTEMI 05/2015 s/p DES to native RCA and SVG-dRCA; occluded ramus-SVG was treated medically). c. neg [facility]uc 10/2015 [facility].   Chronic diastolic CHF (congestive heart failure) (HCC)    Chronic fatigue    CKD (chronic kidney disease), stage II    DEPRESSION 12/31/2009   [place] DISEASE, CERVICAL 12/31/2009   [place] DISEASE, LUMBAR 12/31/2009   DIVERTICULITIS, HX OF 12/31/2009   Essential hypertension    Gait abnormality 03/25/2017   GLUCOSE INTOLERANCE 12/31/2009   Habitual alcohol use    History of stroke    self reports " they say i have had some pin strokes"    Hypercalcemia    Hyperkalemia    Hyperlipidemia 12/31/2009   Hypothyroidism    MENOPAUSE, EARLY 12/31/2009   NSTEMI (non-ST elevated  myocardial infarction) ([place]) 05/11/2015   Orthostatic hypotension    OSTEOARTHRITIS, HAND    Pacemaker 01/21/2012   MDT Adapta dual chamber   Paroxysmal atrial flutter (HCC)    Persistent atrial fibrillation (HCC)    PVD (peripheral vascular disease), hx stents to bil SFAs 02/2010    Rash, skin     superficial raised red pencil point sized rash bilateral forearms; states " it started when i started the Plavix "    Ruptured left breast implant    Symptomatic sinus bradycardia 01/22/2012   Syncope    a. 11/2015 ? due to medications.   Syncope    reports on 05-10-17 " i passed out 2 months ago in the bathroom and broke some ribs"     Tachy-brady syndrome ([place])    a. s/p MDT PPM 2013.   Tricuspid regurgitation    Past Surgical History:  Procedure Laterality Date   ABDOMINAL AORTOGRAM W/LOWER EXTREMITY Bilateral 02/15/2017   Procedure: ABDOMINAL AORTOGRAM W/LOWER EXTREMITY;  Surgeon: Lorretta Harp, MD;  Location: [place] [facility];  Service: Cardiovascular;  Laterality: Bilateral;  bilat   ABDOMINAL AORTOGRAM W/LOWER EXTREMITY N/A 10/06/2018   Procedure: ABDOMINAL AORTOGRAM W/LOWER EXTREMITY;  Surgeon: Lorretta Harp, MD;   Location: [place] [facility];  Service: Cardiovascular;  Laterality: N/A;   Ablation of typical atrial flutter (CTI line)  09/05/2016   Dr Antonieta Pert at [facility]   Removed 01/2018   BREAST IMPLANT REMOVAL Bilateral 01/14/2018   Procedure: REMOVAL BILATERAL BREAST IMPLANTS;  Surgeon: Irene Limbo, MD;  Location: [place];  Service: Plastics;  Laterality: Bilateral;   CAPSULECTOMY Bilateral 01/14/2018   Procedure: CAPSULECTOMY;  Surgeon: Irene Limbo, MD;  Location: [place];  Service: Plastics;  Laterality: Bilateral;   CARDIAC CATHETERIZATION N/A 01/01/2015   Procedure: Left Heart Cath and Coronary Angiography;  Surgeon: Jettie Booze, MD;  Location: [place] [facility];  Service: Cardiovascular;  Laterality: N/A;   CARDIAC CATHETERIZATION N/A 05/12/2015   Procedure: Left Heart Cath and Coronary Angiography;  Surgeon: Troy Sine, MD;  Location: [place] [facility];  Service: Cardiovascular;  Laterality: N/A;   CARDIAC CATHETERIZATION N/A 05/12/2015   Procedure: Coronary Stent Intervention;  Surgeon: Troy Sine, MD;  Location: [place] [facility];  Service: Cardiovascular;  Laterality: N/A;   CARDIOVERSION N/A 02/01/2015   Procedure: CARDIOVERSION;  Surgeon: Lelon Perla, MD;  Location[facility];  Service: Cardiovascular;  Laterality: N/A;   CARDIOVERSION N/A 08/30/2015   Procedure: CARDIOVERSION;  Surgeon: Sanda Klein, MD;  Location: [place] [facility];  Service: Cardiovascular;  Laterality: N/A;   CARPAL TUNNEL RELEASE  2008   "right hand/thumb; carpal tunnel repair; got rid of arthritis" (01/21/2012)   CLIPPING OF ATRIAL APPENDAGE N/A 01/04/2015   Procedure: CLIPPING OF ATRIAL APPENDAGE;  Surgeon: Grace Isaac, MD;  Location: [place];  Service: Open Heart Surgery;  Laterality: N/A;   COLONOSCOPY N/A 12/21/2015   Procedure: COLONOSCOPY;  Surgeon: Milus Banister, MD;  Location: [facility]OS[facility];  Service:  Endoscopy;  Laterality: N/A;   CORONARY ARTERY BYPASS GRAFT N/A 01/04/2015   Procedure: CORONARY ARTERY BYPASS GRAFTING (CABG) x 3 using left internal mammory artery and greater saphenous vein right leg harvested endoscopically.;  Surgeon: Grace Isaac, MD;  LIMA-LAD, SVG-RI, SVG-PDA   CORONARY STENT INTERVENTION N/A 11/07/2020   Procedure: CORONARY STENT INTERVENTION;  Surgeon: Early Osmond, MD;  Location: [place] [facility];  Service: Cardiovascular;  Laterality: N/A;   ESOPHAGOGASTRODUODENOSCOPY (EGD) WITH PROPOFOL N/A 12/18/2015   Procedure: ESOPHAGOGASTRODUODENOSCOPY (EGD) WITH PROPOFOL;  Surgeon: Milus Banister, MD;  Location: [facility];  Service: Endoscopy;  Laterality: N/A;   FACELIFT, LOWER 2/3  1995   "mini" (01/21/2012)   GIVENS CAPSULE STUDY N/A 12/21/2015   Procedure: GIVENS CAPSULE STUDY;  Surgeon: Milus Banister, MD;  Location: [facility];  Service: Endoscopy;  Laterality: N/A;   LEFT HEART CATH AND CORS/GRAFTS ANGIOGRAPHY N/A 11/07/2020   Procedure: LEFT HEART CATH AND CORS/GRAFTS ANGIOGRAPHY;  Surgeon: Early Osmond, MD;  Location: [place] [facility];  Service: Cardiovascular;  Laterality: N/A;   Lower Arterial Examination  10/28/2011   R. SFA stent mild-moderate mixed density plaque with elevated velocities consistent with 50% diameter reduction. L. SFA stent moderate mixed denisty plaque at mid to distal level consistent with 50-69% diameter reduction.   LOWER EXTREMITY ANGIOGRAPHY N/A 02/15/2017   Procedure: LOWER EXTREMITY ANGIOGRAPHY;  Surgeon: Lorretta Harp, MD;  Location: [place] [facility];  Service: Cardiovascular;  Laterality: N/A;   OOPHORECTOMY  ~1979   PARATHYROIDECTOMY N/A 05/13/2017   Procedure: PARATHYROIDECTOMY;  Surgeon: Armandina Gemma, MD;  [facility];  Service: General;  Laterality: N/A;   PARTIAL COLECTOMY  2010   PERIPHERAL ARTERIAL STENT GRAFT  2012; 2012   "LLE; RLE" (01/21/2012)   PERIPHERAL VASCULAR BALLOON ANGIOPLASTY Right  02/15/2017   Procedure: PERIPHERAL VASCULAR BALLOON ANGIOPLASTY;  Surgeon: Lorretta Harp, MD;  Location: [place] [facility];  Service: Cardiovascular;  Laterality: Right;  SFA     PERIPHERAL VASCULAR INTERVENTION Right 10/06/2018   Procedure: PERIPHERAL VASCULAR INTERVENTION;  Surgeon: Lorretta Harp, MD;  Location: [place] [facility];  Service: Cardiovascular;  Laterality: Right;   PERMANENT PACEMAKER INSERTION N/A 01/21/2012   Medtronic Adapta L implanted by Dr Sallyanne Kuster for tachy/brady syndrome   POSTERIOR CERVICAL LAMINECTOMY  1985   TEE WITHOUT CARDIOVERSION N/A 01/04/2015   Procedure: TRANSESOPHAGEAL ECHOCARDIOGRAM (TEE);  Surgeon: Grace Isaac, MD;  Location: [place];  Service: Open Heart Surgery;  Laterality: N/A;   TEE WITHOUT CARDIOVERSION N/A 02/01/2015   Procedure: TRANSESOPHAGEAL ECHOCARDIOGRAM (TEE);  Surgeon: Lelon Perla, MD;  Location: [facility]OSCOPY;  Service: Cardiovascular;  Laterality: N/A;   TEE WITHOUT CARDIOVERSION N/A 08/30/2015   Procedure: TRANSESOPHAGEAL ECHOCARDIOGRAM (TEE);  Surgeon: Sanda Klein, MD;  Location: [facility] ENDOSCOPY;  Service: Cardiovascular;  Laterality: N/A;   VAGINAL HYSTERECTOMY  1975   Social History   Tobacco Use  Smoking Status Former   Packs/day: 0.75   Years: 10.00   Total pack years: 7.50   Types: Cigarettes   Quit date: 2008   Years since quitting: 16.1  Smokeless Tobacco Never  Tobacco Comments   01/21/2012 "quit smoking ~ 2002"  To begin PREP class at [facility] on 03/24/22, every T/Th 2-3:15  [place] Kaleena Corrow 03/19/2022, 2:34 PM

## 2022-03-24 NOTE — Progress Notes (Signed)
[facility] PREP Weekly Session  Patient Details  Name: [name] MRN: [id number] Date of Birth: Dec 28, 1940 Age: 82 y.o. PCP: Horald Pollen, MD  There were no vitals filed for this visit.   [facility] Weekly seesion - 03/24/22 1600       [facility] "PREP" Location   [facility] "PREP" Location [facility]      Weekly Session   Topic Discussed Goal setting and welcome to the program   Introductions, Review of notebook, tour of facility   Classes attended to date [place] 03/24/2022, 4:02 PM

## 2022-03-25 ENCOUNTER — Ambulatory Visit: Payer: Self-pay | Admitting: Licensed Clinical Social Worker

## 2022-03-25 NOTE — Patient Instructions (Signed)
Visit Information  Thank you for taking time to visit with me today. Please don't hesitate to contact me if I can be of assistance to you.   Following are the goals we discussed today:   Goals Addressed             This Visit's Progress    Reduce and manage symptoms of Depression       Activities and task to complete in order to accomplish goals.   Continue with compliance of taking medication prescribed by Doctor Continue with your self-care  (gym 2 days per week, set your schedule and write it down, make your bed daily, continue with your reading and word puzzles) Identify your " projects day" and what you want to work on            [place] next appointment is by telephone on 04/08/22 at 9:30  Please call the care guide team at [phone] if you need to cancel or reschedule your appointment.   If you are experiencing a Mental Health or [place] or need someone to talk to, please call the Suicide and Crisis Lifeline: 988 call the [place] National Suicide Prevention Lifeline: 316 739 1211 or TTY: 202-083-6269 TTY 380-314-8815) to talk to a trained counselor call 1-800-273-TALK (toll free, 24 hour hotline) go to [facility] [address] [place] [phone])   Patient verbalizes understanding of instructions and care plan provided today and agrees to view in [place]. Active MyChart status and patient understanding of how to access instructions and care plan via MyChart confirmed with patient.     [name], [place] [phone]

## 2022-03-25 NOTE — Patient Outreach (Signed)
  Care Coordination  Follow Up Visit Note   03/25/2022 Name: [name] MRN: [id number] DOB: 30-Mar-1940  [name] is a 82 y.o. year old female who sees Sagardia, Ines Bloomer, MD for primary care. I spoke with  [name] by phone today.  What matters to the patients health and wellness today?    Patient is making progress with reducing symptoms of depression.   Recommendation: Patient may benefit from, and is in agreement to continue with Behavioral Activation.    Goals Addressed             This Visit's Progress    Reduce and manage symptoms of Depression       Activities and task to complete in order to accomplish goals.   Continue with compliance of taking medication prescribed by Doctor Continue with your self-care  (gym 2 days per week, set your schedule and write it down, make your bed daily, continue with your reading and word puzzles) Identify your " projects day" and what you want to work on           SDOH assessments and interventions completed:  Yes  SDOH Interventions Today    Flowsheet Row Most Recent Value  SDOH Interventions   Physical Activity Interventions [facility]  [taking classes 2 days per week]  Social Connections Interventions Other (Comment)  [is currently connected to church and [place] Coordination Interventions:  Yes, provided  Interventions Today    Flowsheet Row Most Recent Value  Chronic Disease   Chronic disease during today's visit Hypertension (HTN), Other  [Depression]  Exercise Interventions   Exercise Discussed/Reviewed Exercise Reviewed  Physical Activity Discussed/Reviewed Physical Activity Reviewed, Gym  [2 days per week]  Mental Health Interventions   [place] Reviewed, Coping Strategies  [Behavioral Activation]       Follow up plan: Follow up call scheduled for 04/08/22    Encounter Outcome:  Pt. Visit Completed   [name], [place] [phone]

## 2022-03-31 NOTE — Progress Notes (Signed)
[facility] PREP Weekly Session  Patient Details  Name: [name] MRN: [id number] Date of Birth: December 19, 1940 Age: 82 y.o. PCP: Horald Pollen, MD  Vitals:   03/31/22 1538  Weight: 146 lb (66.2 kg)     [facility] Weekly seesion - 03/31/22 1500       [facility] "PREP" Location   [facility] "PREP" Location [facility]      Weekly Session   Topic Discussed Importance of resistance training;Other ways to be active   Work up to 150 min of cardio/wk; strength training 2-3 times/wk for 20-40 minutes; benefits of CV and strength training   Minutes exercised this week 22 minutes    Classes attended to date [place] 03/31/2022, 3:39 PM

## 2022-04-03 ENCOUNTER — Encounter: Payer: Self-pay | Admitting: Emergency Medicine

## 2022-04-05 ENCOUNTER — Other Ambulatory Visit: Payer: Self-pay | Admitting: Emergency Medicine

## 2022-04-06 ENCOUNTER — Other Ambulatory Visit: Payer: Self-pay | Admitting: Emergency Medicine

## 2022-04-06 MED ORDER — DICLOFENAC SODIUM 75 MG PO TBEC: 75.0000 mg | DELAYED_RELEASE_TABLET | Freq: Two times a day (BID) | ORAL | 0 refills | Status: DC

## 2022-04-06 NOTE — Telephone Encounter (Signed)
What pharmacy she prefers?

## 2022-04-06 NOTE — Telephone Encounter (Signed)
New prescription for diclofenac sent to pharmacy requested.

## 2022-04-07 NOTE — Progress Notes (Signed)
[facility] PREP Weekly Session  Patient Details  Name: [name] MRN: [id number] Date of Birth: 03/09/1940 Age: 82 y.o. PCP: Horald Pollen, MD  Vitals:   04/07/22 1611  Weight: 147 lb (66.7 kg)     [facility] Weekly seesion - 04/07/22 1600       [facility] "PREP" Location   [facility] "PREP" Location [facility]      Weekly Session   Topic Discussed Healthy eating tips   Foods to reduce, foods/nutrients to increase; introduced YUKA app; eat the rainbow of colors   Minutes exercised this week 125 minutes    Classes attended to date [place] 04/07/2022, 4:12 PM

## 2022-04-08 ENCOUNTER — Ambulatory Visit: Payer: Self-pay | Admitting: Licensed Clinical Social Worker

## 2022-04-08 NOTE — Patient Instructions (Signed)
Visit Information  Thank you for taking time to visit with me today. Please don't hesitate to contact me if I can be of assistance to you.   Following are the goals we discussed today:   Goals Addressed             This Visit's Progress    Keep Moving       Activities and task to complete in order to accomplish goals.   Identify your " projects day" and what you want to work on  Self Support options  (continue with gym 2 days per week, set your weekly schedule and write it down, continue reaching out to family and friends, continue with your reading and word puzzles)  Also remember to bring copy of HPOA and DNR to your next office visit for PCP          Our next appointment is by telephone on 04/29/22 at 9:30  Please call the care guide team at [phone] if you need to cancel or reschedule your appointment.   If you are experiencing a Mental Health or [place] or need someone to talk to, please call the [place] National Suicide Prevention Lifeline: 682-887-2430 or TTY: 670-629-1518 TTY 6413969802) to talk to a trained counselor call 1-800-273-TALK (toll free, 24 hour hotline)   Patient verbalizes understanding of instructions and care plan provided today and agrees to view in [place]. Active MyChart status and patient understanding of how to access instructions and care plan via MyChart confirmed with patient.     [name], [place] [phone]

## 2022-04-08 NOTE — Patient Outreach (Signed)
  Care Coordination  Follow Up Visit Note   04/08/2022 Name: Anglica Allenbaugh MRN: [id number] DOB: 1940/03/04  Peyton Bottoms Lengel is a 82 y.o. year old female who sees Sagardia, Ines Bloomer, MD for primary care. I spoke with  [name] by phone today.  What matters to the patients health and wellness today?  Patient is making progress with reducing symptoms of depression via behavioral activation.    Goals Addressed             This Visit's Progress    Keep Moving       Activities and task to complete in order to accomplish goals.   Identify your " projects day" and what you want to work on  Self Support options  (continue with gym 2 days per week, set your weekly schedule and write it down, continue reaching out to family and friends, continue with your reading and word puzzles)  Also remember to bring copy of HPOA and DNR to your next office visit for PCP          SDOH assessments and interventions completed:  Yes  SDOH Interventions Today    Flowsheet Row Most Recent Value  SDOH Interventions   Social Connections Interventions Intervention Not Indicated  [doing well with increasing outside activities]        Care Coordination Interventions:  Yes, provided  Interventions Today    Flowsheet Row Most Recent Value  Chronic Disease   Chronic disease during today's visit Congestive Heart Failure (CHF), Hypertension (HTN), Chronic Kidney Disease/End Stage Renal Disease (ESRD)  General Interventions   General Interventions Discussed/Reviewed General Interventions Reviewed  Exercise Interventions   Exercise Discussed/Reviewed Exercise Reviewed  Mental Health Interventions   Mental Health Discussed/Reviewed Mental Health Reviewed, Coping Strategies, Depression  Advanced Directive Interventions   Advanced Directives Discussed/Reviewed Advanced Directives Discussed, Advanced Care Planning  [will bring a copy to scan into chart]       Follow up plan: Follow up call  scheduled for 3 weeks    Encounter Outcome:  Pt. Visit Completed   [name], [place] [phone]

## 2022-04-14 NOTE — Progress Notes (Signed)
[facility] PREP Weekly Session  Patient Details  Name: [name] MRN: [id number] Date of Birth: 1940-05-01 Age: 82 y.o. PCP: Horald Pollen, MD  Vitals:   04/14/22 1547  Weight: 149 lb (67.6 kg)     [facility] Weekly seesion - 04/14/22 1500       [facility] "PREP" Location   [facility] "PREP" Location [facility]      Weekly Session   Topic Discussed Health habits   Sugar demo   Minutes exercised this week 70 minutes    Classes attended to date [place] 04/14/2022, 3:48 PM

## 2022-04-18 ENCOUNTER — Other Ambulatory Visit: Payer: Self-pay | Admitting: Cardiovascular Disease

## 2022-04-21 NOTE — Progress Notes (Signed)
[facility] PREP Weekly Session  Patient Details  Name: [name] MRN: [id number] Date of Birth: September 20, 1940 Age: 82 y.o. PCP: Horald Pollen, MD  Vitals:   04/21/22 1523  Weight: 147 lb (66.7 kg)     [facility] Weekly seesion - 04/21/22 1500       [facility] "PREP" Location   [facility] "PREP" Location [facility]      Weekly Session   Topic Discussed Restaurant Eating   salt demo   Minutes exercised this week 125 minutes    Classes attended to date [place] 04/21/2022, 3:24 PM

## 2022-04-24 ENCOUNTER — Encounter: Payer: Self-pay | Admitting: Cardiovascular Disease

## 2022-04-24 DIAGNOSIS — I739 Peripheral vascular disease, unspecified: Secondary | ICD-10-CM

## 2022-04-24 NOTE — Telephone Encounter (Signed)
Per Dr Sallyanne Kuster  Please go ahead and order aorta and iliac arteries duplex US and lower extremity arterial duplex US for PAD and intermittent claudication.    Mychart message sen to patient

## 2022-04-27 ENCOUNTER — Other Ambulatory Visit (HOSPITAL_COMMUNITY): Payer: Self-pay

## 2022-04-27 ENCOUNTER — Other Ambulatory Visit (HOSPITAL_COMMUNITY): Payer: Self-pay | Admitting: Cardiovascular Disease

## 2022-04-27 DIAGNOSIS — I739 Peripheral vascular disease, unspecified: Secondary | ICD-10-CM

## 2022-04-28 ENCOUNTER — Encounter: Payer: Self-pay | Admitting: Cardiovascular Disease

## 2022-04-28 NOTE — Progress Notes (Signed)
[facility] PREP Weekly Session  Patient Details  Name: [name] MRN: [id number] Date of Birth: January 12, 1941 Age: 82 y.o. PCP: Horald Pollen, MD  Vitals:   04/28/22 1515  Weight: 147 lb (66.7 kg)     [facility] Weekly seesion - 04/28/22 1500       [facility] "PREP" Location   [facility]PREP[facility] Location [facility]      Weekly Session   Topic Discussed Stress management and problem solving   Better Sleep Guidelines, finger tip mudra breathwork   Minutes exercised this week 120 minutes    Classes attended to date [place] 04/28/2022, 3:17 PM

## 2022-04-29 ENCOUNTER — Ambulatory Visit: Payer: Self-pay | Admitting: Licensed Clinical Social Worker

## 2022-04-29 NOTE — Patient Outreach (Signed)
  Care Coordination  Follow Up Visit Note   04/29/2022 Name: [name] MRN: [id number] DOB: 1940/04/27  [name] Pura is a 82 y.o. year old female who sees Sagardia, Ines Bloomer, MD for primary care. I spoke with  [name] by phone today.  What matters to the patients health and wellness today?  Managing symptoms of depression   Patient is making progress with managing symptoms of depression via behavioral activation.  She reports "my depression is getting better". She is doing a great job of not over processing negative thoughts and recognizing when situational things impact her symptoms.   Goals Addressed             This Visit's Progress    COMPLETED: Keep Moving to reduce symptoms of depression       Activities and task to complete in order to accomplish goals.   Self Support options  (continue with gym 2 days per week, set your weekly schedule and write it down, continue reaching out to family and friends, continue with  reading, word puzzles and walking with your walking buddy)  Also remember to bring copy of HPOA and DNR to your next office visit for PCP         SDOH assessments and interventions completed:  No  Care Coordination Interventions:  Yes, provided  Interventions Today    Flowsheet Row Most Recent Value  Chronic Disease   Chronic disease during today's visit Hypertension (HTN), Congestive Heart Failure (CHF), Chronic Kidney Disease/End Stage Renal Disease (ESRD), Other  [Depression]  Exercise Interventions   Exercise Discussed/Reviewed Physical Activity, Exercise Reviewed  Physical Activity Discussed/Reviewed Home Exercise Program (HEP), Physical Activity Reviewed  Mental Health Interventions   Mental Health Discussed/Reviewed Mental Health Reviewed, Coping Strategies, Depression  [solution focused, behavioral activation, Brief CBT, active listening. problem solving and emotional support]  Safety Interventions   Safety Discussed/Reviewed  Safety Discussed  [Denied having SI thoughts,  discussed plan if having SI thoughts in the future, ]       Follow up plan: No further intervention required. Patient has declined continued support from Care Coordination. She will call if needed.  Encounter Outcome:  Pt. Visit Completed   [name], [place] [phone]

## 2022-04-29 NOTE — Patient Instructions (Signed)
Visit Information  Thank you for taking time to visit with me today. Please don't hesitate to contact me if I can be of assistance to you.   Following are the goals we discussed today:   Goals Addressed             This Visit's Progress    COMPLETED: Keep Moving to reduce symptoms of depression       Activities and task to complete in order to accomplish goals.   Self Support options  (continue with gym 2 days per week, set your weekly schedule and write it down, continue reaching out to family and friends, continue with  reading, word puzzles and walking with your walking buddy)  Also remember to bring copy of HPOA and DNR to your next office visit for PCP          Please call the care guide team at [phone] if you need to cancel or reschedule your appointment.   If you are experiencing a Mental Health or [place] or need someone to talk to, please call the Suicide and Crisis Lifeline: 988 call the [place] National Suicide Prevention Lifeline: (225)766-1089 or TTY: 423-273-9931 TTY 913-482-3647) to talk to a trained counselor call 1-800-273-TALK (toll free, 24 hour hotline) go to [facility] [address] [place] [phone])   Patient verbalizes understanding of instructions and care plan provided today and agrees to view in [place]. Active MyChart status and patient understanding of how to access instructions and care plan via MyChart confirmed with patient.     No further follow up required: by care coordination at this time  [name], [place] [phone]

## 2022-05-05 NOTE — Progress Notes (Signed)
[facility] PREP Weekly Session  Patient Details  Name: [name] MRN: [id number] Date of Birth: [birth year] Age: 82 y.o. PCP: Horald Pollen, MD  Vitals:   05/05/22 1525  Weight: 147 lb (66.7 kg)     [facility] Weekly seesion - 05/05/22 1500       [facility] "PREP" Location   [facility]PREP[facility] Location [facility]      Weekly Session   Topic Discussed Expectations and non-scale victories   Halfway through program review, revisit refocus on goals; staying postive   Minutes exercised this week 120 minutes    Classes attended to date [place] 05/05/2022, 3:26 PM

## 2022-05-12 ENCOUNTER — Ambulatory Visit (HOSPITAL_BASED_OUTPATIENT_CLINIC_OR_DEPARTMENT_OTHER)
Admission: RE | Admit: 2022-05-12 | Discharge: 2022-05-12 | Disposition: A | Discharge status: Home / Self Care | Payer: Medicare HMO | Source: Ambulatory Visit | Attending: Cardiovascular Disease | Admitting: Cardiovascular Disease

## 2022-05-12 ENCOUNTER — Ambulatory Visit (INDEPENDENT_AMBULATORY_CARE_PROVIDER_SITE_OTHER): Payer: Medicare HMO

## 2022-05-12 ENCOUNTER — Ambulatory Visit (HOSPITAL_COMMUNITY)
Admission: RE | Admit: 2022-05-12 | Discharge: 2022-05-12 | Disposition: A | Discharge status: Home / Self Care | Payer: Medicare HMO | Source: Ambulatory Visit | Attending: Cardiology | Admitting: Cardiology

## 2022-05-12 DIAGNOSIS — Z95820 Peripheral vascular angioplasty status with implants and grafts: Secondary | ICD-10-CM | POA: Diagnosis not present

## 2022-05-12 DIAGNOSIS — I739 Peripheral vascular disease, unspecified: Secondary | ICD-10-CM

## 2022-05-12 DIAGNOSIS — I495 Sick sinus syndrome: Secondary | ICD-10-CM

## 2022-05-13 ENCOUNTER — Encounter: Payer: Self-pay | Admitting: Cardiovascular Disease

## 2022-05-13 NOTE — Progress Notes (Signed)
[facility] PREP Weekly Session  Patient Details  Name: [name] MRN: [id number] Date of Birth: 1940-12-09 Age: 82 y.o. PCP: Horald Pollen, MD  Vitals:   05/12/22 1400  Weight: 147 lb (66.7 kg)     [facility] Weekly seesion - 05/13/22 0800       [facility] "PREP" Location   [facility]PREP[facility] Location [facility]      Weekly Session   Topic Discussed Other   Portion Control; Visualize your portion size demo; Red sugar Craisin label review   Minutes exercised this week 180 minutes    Classes attended to date [place] 05/13/2022, 8:32 AM

## 2022-05-14 ENCOUNTER — Other Ambulatory Visit: Payer: Self-pay | Admitting: Emergency Medicine

## 2022-05-14 DIAGNOSIS — I739 Peripheral vascular disease, unspecified: Secondary | ICD-10-CM

## 2022-05-14 LAB — VAS US ABI WITH/WO TBI
Left ABI: 0.94
Right ABI: 0.98

## 2022-05-19 NOTE — Progress Notes (Signed)
[facility] PREP Weekly Session  Patient Details  Name: [name] MRN: [id number] Date of Birth: 1940-09-25 Age: 82 y.o. PCP: Georgina Quint, MD  Vitals:   05/19/22 1523  Weight: 147 lb (66.7 kg)     [facility] Weekly seesion - 05/19/22 1500       [facility] "PREP" Location   [facility]PREP[facility] Location [facility]      Weekly Session   Topic Discussed Finding support   Review of food labels brought in from home   Minutes exercised this week 180 minutes    Classes attended to date 26             Dhwani Venkatesh B Manju Kulkarni 05/19/2022, 3:24 PM

## 2022-05-20 ENCOUNTER — Ambulatory Visit (INDEPENDENT_AMBULATORY_CARE_PROVIDER_SITE_OTHER): Payer: Medicare HMO

## 2022-05-20 DIAGNOSIS — I495 Sick sinus syndrome: Secondary | ICD-10-CM | POA: Diagnosis not present

## 2022-05-20 LAB — CUP PACEART REMOTE DEVICE CHECK
Battery Impedance: 2013 Ohm
Battery Remaining Longevity: 33 mo
Battery Voltage: 2.75 V
Brady Statistic AP VP Percent: 0 %
Brady Statistic AP VS Percent: 100 %
Brady Statistic AS VP Percent: 0 %
Brady Statistic AS VS Percent: 0 %
Date Time Interrogation Session: 20240410104453
Implantable Lead Connection Status: 753985
Implantable Lead Connection Status: 753985
Implantable Lead Implant Date: 20131212
Implantable Lead Implant Date: 20131212
Implantable Lead Location: 753859
Implantable Lead Location: 753860
Implantable Lead Model: 5076
Implantable Lead Model: 5076
Implantable Pulse Generator Implant Date: 20131212
Lead Channel Impedance Value: 461 Ohm
Lead Channel Impedance Value: 537 Ohm
Lead Channel Pacing Threshold Amplitude: 0.625 V
Lead Channel Pacing Threshold Amplitude: 0.875 V
Lead Channel Pacing Threshold Pulse Width: 0.4 ms
Lead Channel Pacing Threshold Pulse Width: 0.4 ms
Lead Channel Setting Pacing Amplitude: 2 V
Lead Channel Setting Pacing Amplitude: 2.5 V
Lead Channel Setting Pacing Pulse Width: 0.4 ms
Lead Channel Setting Sensing Sensitivity: 4 mV
Zone Setting Status: 755011
Zone Setting Status: 755011

## 2022-05-21 DIAGNOSIS — H04123 Dry eye syndrome of bilateral lacrimal glands: Secondary | ICD-10-CM | POA: Diagnosis not present

## 2022-05-21 DIAGNOSIS — H16142 Punctate keratitis, left eye: Secondary | ICD-10-CM | POA: Diagnosis not present

## 2022-05-26 NOTE — Progress Notes (Signed)
[facility] PREP Weekly Session  Patient Details  Name: [name] MRN: [id number] Date of Birth: 01/24/1941 Age: 82 y.o. PCP: Georgina Quint, MD  Vitals:   05/26/22 1514  Weight: 147 lb (66.7 kg)     [facility] Weekly seesion - 05/26/22 1500       [facility] "PREP" Location   [facility]PREP[facility] Location [facility]      Weekly Session   Topic Discussed Calorie breakdown   carbs, fats and proteins guidelines; difference between simple and complex carbs   Minutes exercised this week 60 minutes    Classes attended to date 43             Elani Delph B Aleeta Schmaltz 05/26/2022, 3:15 PM

## 2022-06-02 NOTE — Progress Notes (Signed)
[facility] PREP Weekly Session  Patient Details  Name: [name] MRN: [id number] Date of Birth: 1940/09/09 Age: 82 y.o. PCP: Georgina Quint, MD  Vitals:   06/02/22 1600  Weight: 141 lb (64 kg)     [facility] Weekly seesion - 06/02/22 1600       [facility] "PREP" Location   [facility] "PREP" Location [facility]      Weekly Session   Topic Discussed Hitting roadblocks   Membership talk w/Nick, Y Ship broker   Minutes exercised this week 105 minutes    Classes attended to date 20             Lailyn Appelbaum B Dawon Troop 06/02/2022, 4:00 PM

## 2022-06-09 NOTE — Progress Notes (Signed)
[facility] PREP Weekly Session  Patient Details  Name: [name] MRN: [id number] Date of Birth: 02-02-1941 Age: 82 y.o. PCP: Georgina Quint, MD  Vitals:   06/09/22 1553  Weight: 147 lb (66.7 kg)     [facility] Weekly seesion - 06/09/22 1500       [facility] "PREP" Location   [facility] "PREP[facility] Location [facility]      Weekly Session   Topic Discussed Other   Fit testing completed; how fit and strong survey completed; final assessment visited scheduled; 4 stage balance test.   Minutes exercised this week 240 minutes    Classes attended to date 9             Inanna Telford B Robertine Kipper 06/09/2022, 3:54 PM

## 2022-06-10 ENCOUNTER — Ambulatory Visit: Payer: Medicare HMO | Attending: Cardiovascular Disease | Admitting: Cardiovascular Disease

## 2022-06-10 ENCOUNTER — Encounter: Payer: Self-pay | Admitting: Cardiovascular Disease

## 2022-06-10 VITALS — BP 121/73 | HR 79 | Ht 63.0 in | Wt 148.0 lb

## 2022-06-10 DIAGNOSIS — I739 Peripheral vascular disease, unspecified: Secondary | ICD-10-CM | POA: Diagnosis not present

## 2022-06-10 MED ORDER — CILOSTAZOL 50 MG PO TABS: 50.0000 mg | ORAL_TABLET | Freq: Two times a day (BID) | ORAL | 3 refills | Status: DC

## 2022-06-10 NOTE — Progress Notes (Signed)
06/10/2022 [name] [name]   11/02/1940  [id number]  Primary Physician Sagardia, Eilleen Kempf, MD Primary Cardiologist: Runell Gess MD Milagros Loll, [place], [place]  HPI:  [name] is a 82 y.o.   moderately overweight engaged Caucasian female mother of 3, grandmother and 6 grandchildren who was referred by Dr. Royann Shivers for peripheral vascular evaluation because otherwise tolerating claudication.  I last saw her in the office 06/26/2020.  She is accompanied by her friend Windell Moulding who appears to be in a former Engineer, civil (consulting) at [employer] for 50 years.  She does have a history of PAF having undergone ablation at [facility] a month ago. She is on Tikosyn and Eliquis  however she has not taken her Eliquis several weeks. She has a history of CABG 3 in 2016 and subsequent stent procedures because of early graft failure. She also has PAD status post right left SFA intervention in a staged fashion in 2012 by myself. Over the last year she's had recurrent right lower extreme claudication with Dopplers to suggest a high-frequency signal in her proximal right SFA.   I performed peripheral angiography and intervention on her 02/15/2017 with directional atherectomy and drug-coated balloon angioplasty of her proximal right SFA.  Her right and left SFA stents had 30 to 40% "in-stent restenosis bilaterally and she has three-vessel runoff.  Her post procedure Dopplers performed 03/12/2017 revealed ABIs of 1.14 bilaterally with triphasic waveforms.  Her current Dopplers performed 05/04/2018 revealed a decline in her right ABI to .82  with monophasic waveforms beyond the stented segment suggesting progression of "in-stent restenosis.   I performed peripheral angiography on her 10/06/2018 revealing occluded right SFA just proximal to the previously placed stent reconstituting distal to the stent.  I was able to get across this, balloon and restented with overlapping Iluvien drug-eluting stents.  Her follow-up Dopplers  performed 10/18/2018 showed normal ABIs and her most recent Dopplers performed 05/10/2019 showed continued patency.  She does complain of right hip discomfort with ambulation although she is never had aortoiliac disease in the past.   Since I saw her in the office 2 years ago she has started to complain of bilateral lower extremity claudication.  She has a known occluded right renal artery and an atrophic right kidney with mild renal insufficiency.  Her left renal artery is widely patent.  Her most recent Dopplers performed 05/12/2022 revealed right ABI of 0.98 and left of 0.94.  Her SFA stents appear patent with mild in-stent restenosis.  She does have mild to moderate disease in her left common femoral artery.   Current Meds  Medication Sig   amLODipine (NORVASC) 5 MG tablet TAKE 1 TABLET (5 MG TOTAL) BY MOUTH IN THE MORNING AND AT BEDTIME   ascorbic acid (VITAMIN C) 250 MG CHEW Chew 750 mg by mouth daily.   aspirin EC 81 MG tablet Take 81 mg by mouth daily.    Biotin 21308 MCG TABS Take 10,000 mcg by mouth daily.   carvedilol (COREG) 6.25 MG tablet TAKE 1 TABLET BY MOUTH TWICE A DAY WITH FOOD   Cholecalciferol (VITAMIN D) 50 MCG (2000 UT) tablet Take 2,000 Units by mouth daily.   citalopram (CELEXA) 40 MG tablet TAKE 1 TABLET BY MOUTH EVERY DAY   Cyanocobalamin (B-12) 5000 MCG CAPS Take 10,000 mcg by mouth daily.   diclofenac (VOLTAREN) 75 MG EC tablet Take 1 tablet (75 mg total) by mouth 2 (two) times daily.   dofetilide (TIKOSYN) 125 MCG  capsule TAKE 1 CAPSULE BY MOUTH EVERY 12 HOURS. (GENERIC FOR TIKOSYN)   Evolocumab (REPATHA SURECLICK) 140 MG/ML SOAJ Inject 140 mg into the skin every 14 (fourteen) days.   loteprednol (LOTEMAX) 0.5 % ophthalmic suspension Place 2 drops into both eyes See admin instructions. Instill 2 drops into both eyes 2 times a day twice weekly   Magnesium 200 MG TABS Take 200 mg by mouth daily.   Multiple Vitamin (MULTIVITAMIN WITH MINERALS) TABS tablet Take 1 tablet by  mouth 2 (two) times daily.   Multiple Vitamins-Minerals (ZINC PO) Take 150 mg by mouth daily.   nitroGLYCERIN (NITROSTAT) 0.4 MG SL tablet Place 1 tablet (0.4 mg total) under the tongue every 5 (five) minutes x 3 doses as needed for chest pain.   rosuvastatin (CRESTOR) 10 MG tablet Take 1 tablet (10 mg total) by mouth daily.   VITAMIN D-VITAMIN K PO Take 1 capsule by mouth daily.     Allergies  Allergen Reactions   Brilinta [Ticagrelor] Shortness Of Breath   Atorvastatin Other (See Comments)    Possible cause of fatigue/malaise   Clonidine Derivatives Other (See Comments)    Lowers heart rate   Crestor [Rosuvastatin Calcium] Other (See Comments)    Joint/muscle aches   Exforge [Amlodipine Besylate-Valsartan] Itching and Rash   Spironolactone Other (See Comments)    Contraindicated with history of hyperkalemia    Social History   Socioeconomic History   Marital status: Widowed    Spouse name: Not on file   Number of children: 3   Years of education: Not on file   Highest education level: Not on file  Occupational History   Occupation: Retired Advertising account executive: RETIRED  Tobacco Use   Smoking status: Former    Packs/day: 0.75    Years: 10.00    Additional pack years: 0.00    Total pack years: 7.50    Types: Cigarettes    Quit date: 2008    Years since quitting: 16.3   Smokeless tobacco: Never   Tobacco comments:    01/21/2012 "quit smoking ~ 2002"  Vaping Use   Vaping Use: Never used  Substance and Sexual Activity   Alcohol use: Yes    Comment: daily coctail   Drug use: No   Sexual activity: Not Currently  Other Topics Concern   Not on file  Social History Narrative   Lives in [place].  Retired.   Social Determinants of Health   Financial Resource Strain: Low Risk  (02/11/2022)   Overall Financial Resource Strain (CARDIA)    Difficulty of Paying Living Expenses: Not very hard  Food Insecurity: No Food Insecurity (01/28/2022)   Hunger Vital Sign     Worried About Running Out of Food in the Last Year: Never true    Ran Out of Food in the Last Year: Never true  Transportation Needs: No Transportation Needs (01/28/2022)   PRAPARE - Administrator, Civil Service (Medical): No    Lack of Transportation (Non-Medical): No  Physical Activity: Sufficiently Active (03/25/2022)   Exercise Vital Sign    Days of Exercise per Week: 3 days    Minutes of Exercise per Session: 60 min  Stress: No Stress Concern Present (03/02/2022)   [employer] of Occupational Health - Occupational Stress Questionnaire    Feeling of Stress : Not at all  Social Connections: Moderately Integrated (04/08/2022)   Social Connection and Isolation Panel [NHANES]    Frequency of Communication with Friends and  Family: More than three times a week    Frequency of Social Gatherings with Friends and Family: More than three times a week    Attends Religious Services: More than 4 times per year    Active Member of Clubs or Organizations: Yes    Attends Banker Meetings: More than 4 times per year    Marital Status: Widowed  Recent Concern: Social Connections - Moderately Isolated (04/08/2022)   Social Connection and Isolation Panel [NHANES]    Frequency of Communication with Friends and Family: Once a week    Frequency of Social Gatherings with Friends and Family: Once a week    Attends Religious Services: More than 4 times per year    Active Member of [employer] or Organizations: Yes    Attends Banker Meetings: More than 4 times per year    Marital Status: Widowed  Intimate Partner Violence: Not on file     Review of Systems: General: negative for chills, fever, night sweats or weight changes.  Cardiovascular: negative for chest pain, dyspnea on exertion, edema, orthopnea, palpitations, paroxysmal nocturnal dyspnea or shortness of breath Dermatological: negative for rash Respiratory: negative for cough or wheezing Urologic: negative  for hematuria Abdominal: negative for nausea, vomiting, diarrhea, bright red blood per rectum, melena, or hematemesis Neurologic: negative for visual changes, syncope, or dizziness All other systems reviewed and are otherwise negative except as noted above.    Blood pressure 121/73, pulse 79, height 5\' 3"  (1.6 m), weight 148 lb (67.1 kg).  General appearance: alert and no distress Neck: no adenopathy, no carotid bruit, no JVD, supple, symmetrical, trachea midline, and thyroid not enlarged, symmetric, no tenderness/mass/nodules Lungs: clear to auscultation bilaterally Heart: regular rate and rhythm, S1, S2 normal, no murmur, click, rub or gallop Extremities: extremities normal, atraumatic, no cyanosis or edema Pulses: Diminished pedal pulses bilaterally Skin: Skin color, texture, turgor normal. No rashes or lesions Neurologic: Grossly normal  EKG atrially paced rhythm at 79 with nonspecific ST and T wave changes.  Personally reviewed this EKG.  ASSESSMENT AND PLAN:   Claudication in peripheral vascular disease (HCC) Ms. Lensing  returns today at the request of Dr. Royann Shivers for reevaluation of PAD.  I last saw her in the office 06/26/2020.  I performed bilateral SFA intervention on her in 2012.  Her last peripheral angiogram which I performed 02/15/2017 was notable for patent stents with mild to moderate "in-stent restenosis and three-vessel runoff bilaterally.  She did have a fairly focal proximal right SFA stenosis Jorie Zee I performed directional atherectomy followed by drug-coated balloon angioplasty.  Her most recent Doppler studies performed 05/12/2022 revealed a right ABI of 0.98 and a left of 0.94.  Her stents appear patent with mild in-stent restenosis bilaterally.  She does have a moderately elevated signal in her left distal common femoral artery although her symptoms are fairly symmetric.  She complains of lifestyle-limiting claudication.  Based on her Dopplers I am not convinced that she has  enough blockage to warrant Yaretzy Olazabal angiography.  I am going to begin her on Pletal 50 mg p.o. twice daily I will see her back in 3 to 4 months in follow-up.     Runell Gess MD FACP,FACC,FAHA, [facility] 06/10/2022 2:51 PM

## 2022-06-10 NOTE — Patient Instructions (Addendum)
Medication Instructions:   START Pletal 50 mg 2 times a day   *If you need a refill on your cardiac medications before your next appointment, please call your pharmacy*  Lab Work: NONE ordered at this time of appointment   If you have labs (blood work) drawn today and your tests are completely normal, you will receive your results only by: MyChart Message (if you have MyChart) OR A paper copy in the mail If you have any lab test that is abnormal or we need to change your treatment, we will call you to review the results.  Testing/Procedures: NONE ordered at this time of appointment   Follow-Up: At [facility], you and your health needs are our priority.  As part of our continuing mission to provide you with exceptional heart care, we have created designated Provider Care Teams.  These Care Teams include your primary Cardiologist (physician) and Advanced Practice Providers (APPs -  Physician Assistants and Nurse Practitioners) who all work together to provide you with the care you need, when you need it.  Your next appointment:   3 month(s)  Provider:   Dr. Allyson Sabal     Other Instructions

## 2022-06-10 NOTE — Assessment & Plan Note (Signed)
[name]  returns today at the request of Dr. Royann Shivers for reevaluation of PAD.  I last saw her in the office 06/26/2020.  I performed bilateral SFA intervention on her in 2012.  Her last peripheral angiogram which I performed 02/15/2017 was notable for patent stents with mild to moderate "in-stent restenosis and three-vessel runoff bilaterally.  She did have a fairly focal proximal right SFA stenosis [name] I performed directional atherectomy followed by drug-coated balloon angioplasty.  Her most recent Doppler studies performed 05/12/2022 revealed a right ABI of 0.98 and a left of 0.94.  Her stents appear patent with mild in-stent restenosis bilaterally.  She does have a moderately elevated signal in her left distal common femoral artery although her symptoms are fairly symmetric.  She complains of lifestyle-limiting claudication.  Based on her Dopplers I am not convinced that she has enough blockage to warrant [name] angiography.  I am going to begin her on Pletal 50 mg p.o. twice daily I will see her back in 3 to 4 months in follow-up.

## 2022-06-11 NOTE — Progress Notes (Signed)
[facility] PREP Evaluation  Patient Details  Name: [name] MRN: [id number] Date of Birth: 04/16/1940 Age: 82 y.o. PCP: Georgina Quint, MD  Vitals:   06/11/22 1422  BP: (!) 90/50  Pulse: 83  SpO2: 95%  Weight: 147 lb (66.7 kg)     [facility] Eval - 06/11/22 1400       [facility] "PREP" Location   [facility] "PREP" Location [facility]      Referral    Program End Date 06/11/22      Measurement   Waist Circumference 37.5 inches    Waist Circumference End Program 34 inches    Hip Circumference 40.5 inches    Hip Circumference End Program 40 inches    Body fat 38 percent      Mobility and Daily Activities   I find it easy to walk up or down two or more flights of stairs. 3    I have no trouble taking out the trash. 1    I do housework such as vacuuming and dusting on my own without difficulty. 3    I can easily lift a gallon of milk (8lbs). 4    I can easily walk a mile. 4    I have no trouble reaching into high cupboards or reaching down to pick up something from the floor. 1    I do not have trouble doing out-door work such as Loss adjuster, chartered, raking leaves, or gardening. 4      Mobility and Daily Activities   I feel younger than my age. 1    I feel independent. 4    I feel energetic. 3    I live an active life.  2    I feel strong. 2    I feel healthy. 3    I feel active as other people my age. 4      How fit and strong are you.   Fit and Strong Total Score 39            Past Medical History:  Diagnosis Date   ANXIETY 12/31/2009   Aortic insufficiency    a. mod by echo 2017.   CAD (coronary artery disease)    a. s/p CABGx3 and LAA clipping in 12/2014, NSTEMI 05/2015 s/p DES to native RCA and SVG-dRCA; occluded ramus-SVG was treated medically). c. neg nuc 10/2015 at [facility].   Chronic diastolic CHF (congestive heart failure) (HCC)    Chronic fatigue    CKD (chronic kidney disease), stage II    DEPRESSION 12/31/2009   DISC DISEASE, CERVICAL 12/31/2009   DISC  DISEASE, LUMBAR 12/31/2009   DIVERTICULITIS, HX OF 12/31/2009   Essential hypertension    Gait abnormality 03/25/2017   GLUCOSE INTOLERANCE 12/31/2009   Habitual alcohol use    History of stroke    self reports " they say i have had some pin strokes"    Hypercalcemia    Hyperkalemia    Hyperlipidemia 12/31/2009   Hypothyroidism    MENOPAUSE, EARLY 12/31/2009   NSTEMI (non-ST elevated myocardial infarction) (HCC) 05/11/2015   Orthostatic hypotension    OSTEOARTHRITIS, HAND    Pacemaker 01/21/2012   MDT Adapta dual chamber   Paroxysmal atrial flutter (HCC)    Persistent atrial fibrillation (HCC)    PVD (peripheral vascular disease), hx stents to bil SFAs 02/2010    Rash, skin     superficial raised red pencil point sized rash bilateral forearms; states " it started when i started the Plavix "  Ruptured left breast implant    Symptomatic sinus bradycardia 01/22/2012   Syncope    a. 11/2015 ? due to medications.   Syncope    reports on 05-10-17 " i passed out 2 months ago in the bathroom and broke some ribs"     Tachy-brady syndrome (HCC)    a. s/p MDT PPM 2013.   Tricuspid regurgitation    Past Surgical History:  Procedure Laterality Date   ABDOMINAL AORTOGRAM W/LOWER EXTREMITY Bilateral 02/15/2017   Procedure: ABDOMINAL AORTOGRAM W/LOWER EXTREMITY;  Surgeon: Runell Gess, MD;  [facility]: [facility];  Service: Cardiovascular;  Laterality: Bilateral;  bilat   ABDOMINAL AORTOGRAM W/LOWER EXTREMITY N/A 10/06/2018   Procedure: ABDOMINAL AORTOGRAM W/LOWER EXTREMITY;  Surgeon: Runell Gess, MD;  Location: [facility];  Service: Cardiovascular;  Laterality: N/A;   Ablation of typical atrial flutter (CTI line)  09/05/2016   Dr Beatrix Fetters at [facility]    AUGMENTATION MAMMAPLASTY  1983   Removed 01/2018   BREAST IMPLANT REMOVAL Bilateral 01/14/2018   Procedure: REMOVAL BILATERAL BREAST IMPLANTS;  Surgeon: Glenna Fellows, MD;  Location: [facility];   Service: Plastics;  Laterality: Bilateral;   CAPSULECTOMY Bilateral 01/14/2018   Procedure: CAPSULECTOMY;  Surgeon: Glenna Fellows, MD;  Location: [facility];  Service: Plastics;  Laterality: Bilateral;   CARDIAC CATHETERIZATION N/A 01/01/2015   Procedure: Left Heart Cath and Coronary Angiography;  Surgeon: Corky Crafts, MD;  Location: [facility];  Service: Cardiovascular;  Laterality: N/A;   CARDIAC CATHETERIZATION N/A 05/12/2015   Procedure: Left Heart Cath and Coronary Angiography;  Surgeon: Lennette Bihari, MD;  [facility]: [facility];  Service: Cardiovascular;  Laterality: N/A;   CARDIAC CATHETERIZATION N/A 05/12/2015   Procedure: Coronary Stent Intervention;  Surgeon: Lennette Bihari, MD;  [facility];  Service: Cardiovascular;  Laterality: N/A;   CARDIOVERSION N/A 02/01/2015   Procedure: CARDIOVERSION;  Surgeon: Lewayne Bunting, MD;  Location: [facility] ENDOSCOPY;  Service: Cardiovascular;  Laterality: N/A;   CARDIOVERSION N/A 08/30/2015   Procedure: CARDIOVERSION;  Surgeon: Thurmon Fair, MD;  Location: [facility];  Service: Cardiovascular;  Laterality: N/A;   CARPAL TUNNEL RELEASE  2008   "right hand/thumb; carpal tunnel repair; got rid of arthritis" (01/21/2012)   CLIPPING OF ATRIAL APPENDAGE N/A 01/04/2015   Procedure: CLIPPING OF ATRIAL APPENDAGE;  Surgeon: Delight Ovens, MD;  Location: [facility] OR;  Service: Open Heart Surgery;  Laterality: N/A;   COLONOSCOPY N/A 12/21/2015   Procedure: COLONOSCOPY;  Surgeon: Rachael Fee, MD;  Location: [facility];  Service: Endoscopy;  Laterality: N/A;   CORONARY ARTERY BYPASS GRAFT N/A 01/04/2015   Procedure: CORONARY ARTERY BYPASS GRAFTING (CABG) x 3 using left internal mammory artery and greater saphenous vein right leg harvested endoscopically.;  Surgeon: Delight Ovens, MD;  LIMA-LAD, SVG-RI, SVG-PDA   CORONARY STENT INTERVENTION N/A 11/07/2020   Procedure: CORONARY STENT INTERVENTION;   Surgeon: Orbie Pyo, MD;  [facility];  Service: Cardiovascular;  Laterality: N/A;   ESOPHAGOGASTRODUODENOSCOPY (EGD) WITH PROPOFOL N/A 12/18/2015   Procedure: ESOPHAGOGASTRODUODENOSCOPY (EGD) WITH PROPOFOL;  Surgeon: Rachael Fee, MD;  Location: [facility];  Service: Endoscopy;  Laterality: N/A;   FACELIFT, LOWER 2/3  1995   "mini" (01/21/2012)   GIVENS CAPSULE STUDY N/A 12/21/2015   Procedure: GIVENS CAPSULE STUDY;  Surgeon: Rachael Fee, MD;  Location: [facility];  Service: Endoscopy;  Laterality: N/A;   LEFT HEART  CATH AND CORS/GRAFTS ANGIOGRAPHY N/A 11/07/2020   Procedure: LEFT HEART CATH AND CORS/GRAFTS ANGIOGRAPHY;  Surgeon: Orbie Pyo, MD;  [facility];  Service: Cardiovascular;  Laterality: N/A;   Lower Arterial Examination  10/28/2011   R. SFA stent mild-moderate mixed density plaque with elevated velocities consistent with 50% diameter reduction. L. SFA stent moderate mixed denisty plaque at mid to distal level consistent with 50-69% diameter reduction.   LOWER EXTREMITY ANGIOGRAPHY N/A 02/15/2017   Procedure: LOWER EXTREMITY ANGIOGRAPHY;  Surgeon: Runell Gess, MD;  [facility];  Service: Cardiovascular;  Laterality: N/A;   OOPHORECTOMY  ~1979   PARATHYROIDECTOMY N/A 05/13/2017   Procedure: PARATHYROIDECTOMY;  Surgeon: Darnell Level, MD;  Location: [facility];  Service: General;  Laterality: N/A;   PARTIAL COLECTOMY  2010   PERIPHERAL ARTERIAL STENT GRAFT  2012; 2012   "LLE; RLE" (01/21/2012)   PERIPHERAL VASCULAR BALLOON ANGIOPLASTY Right 02/15/2017   Procedure: PERIPHERAL VASCULAR BALLOON ANGIOPLASTY;  Surgeon: Runell Gess, MD;  [facility];  Service: Cardiovascular;  Laterality: Right;  SFA     PERIPHERAL VASCULAR INTERVENTION Right 10/06/2018   Procedure: PERIPHERAL VASCULAR INTERVENTION;  Surgeon: Runell Gess, MD;  [facility];  Service: Cardiovascular;  Laterality:  Right;   PERMANENT PACEMAKER INSERTION N/A 01/21/2012   Medtronic Adapta L implanted by Dr Royann Shivers for tachy/brady syndrome   POSTERIOR CERVICAL LAMINECTOMY  1985   TEE WITHOUT CARDIOVERSION N/A 01/04/2015   Procedure: TRANSESOPHAGEAL ECHOCARDIOGRAM (TEE);  Surgeon: Delight Ovens, MD;  Location: [facility] OR;  Service: Open Heart Surgery;  Laterality: N/A;   TEE WITHOUT CARDIOVERSION N/A 02/01/2015   Procedure: TRANSESOPHAGEAL ECHOCARDIOGRAM (TEE);  Surgeon: Lewayne Bunting, MD;  Location: [facility] ENDOSCOPY;  Service: Cardiovascular;  Laterality: N/A;   TEE WITHOUT CARDIOVERSION N/A 08/30/2015   Procedure: TRANSESOPHAGEAL ECHOCARDIOGRAM (TEE);  Surgeon: Thurmon Fair, MD;  Location: [facility] ENDOSCOPY;  Service: Cardiovascular;  Laterality: N/A;   VAGINAL HYSTERECTOMY  1975   Social History   Tobacco Use  Smoking Status Former   Packs/day: 0.75   Years: 10.00   Additional pack years: 0.00   Total pack years: 7.50   Types: Cigarettes   Quit date: 2008   Years since quitting: 16.3  Smokeless Tobacco Never  Tobacco Comments   01/21/2012 "quit smoking ~ 2002"  Wt loss: 2 pounds Inches lost: 3.5 inches How fit and strong survey: 03/19/22: 35 06/11/22: 39 Education sessions completed: 12 Work out sessions completed: 11   Tarence Searcy B Wilmon Conover 06/11/2022, 2:24 PM

## 2022-06-22 DIAGNOSIS — I739 Peripheral vascular disease, unspecified: Secondary | ICD-10-CM | POA: Diagnosis not present

## 2022-06-25 ENCOUNTER — Encounter: Payer: Self-pay | Admitting: Cardiovascular Disease

## 2022-06-25 DIAGNOSIS — I739 Peripheral vascular disease, unspecified: Secondary | ICD-10-CM

## 2022-06-25 NOTE — Telephone Encounter (Signed)
I do not have any problem with that request.  Not sure how to make the referral but I see that she provided a fax number

## 2022-06-26 NOTE — Progress Notes (Signed)
Remote pacemaker transmission.   

## 2022-06-29 ENCOUNTER — Other Ambulatory Visit: Payer: Self-pay | Admitting: Emergency Medicine

## 2022-06-29 ENCOUNTER — Encounter: Payer: Self-pay | Admitting: Emergency Medicine

## 2022-06-29 ENCOUNTER — Telehealth: Payer: Self-pay | Admitting: Emergency Medicine

## 2022-06-29 DIAGNOSIS — I739 Peripheral vascular disease, unspecified: Secondary | ICD-10-CM

## 2022-06-29 NOTE — Telephone Encounter (Signed)
Faxed referral to [facility]

## 2022-06-29 NOTE — Progress Notes (Signed)
Pt requesting referral to [facility] for 2nd opinion

## 2022-06-30 ENCOUNTER — Other Ambulatory Visit (HOSPITAL_COMMUNITY): Payer: Self-pay | Admitting: Cardiovascular Disease

## 2022-06-30 DIAGNOSIS — I739 Peripheral vascular disease, unspecified: Secondary | ICD-10-CM

## 2022-07-02 NOTE — Progress Notes (Signed)
Remote pacemaker transmission.   

## 2022-07-09 DIAGNOSIS — R809 Proteinuria, unspecified: Secondary | ICD-10-CM | POA: Diagnosis not present

## 2022-07-09 DIAGNOSIS — N1832 Chronic kidney disease, stage 3b: Secondary | ICD-10-CM | POA: Diagnosis not present

## 2022-07-09 DIAGNOSIS — E213 Hyperparathyroidism, unspecified: Secondary | ICD-10-CM | POA: Diagnosis not present

## 2022-07-09 DIAGNOSIS — R350 Frequency of micturition: Secondary | ICD-10-CM | POA: Diagnosis not present

## 2022-07-09 DIAGNOSIS — I701 Atherosclerosis of renal artery: Secondary | ICD-10-CM | POA: Diagnosis not present

## 2022-07-09 DIAGNOSIS — I5042 Chronic combined systolic (congestive) and diastolic (congestive) heart failure: Secondary | ICD-10-CM | POA: Diagnosis not present

## 2022-07-09 DIAGNOSIS — I129 Hypertensive chronic kidney disease with stage 1 through stage 4 chronic kidney disease, or unspecified chronic kidney disease: Secondary | ICD-10-CM | POA: Diagnosis not present

## 2022-07-10 LAB — LAB REPORT - SCANNED
Creatinine, POC: 104.9 mg/dL
EGFR: 46

## 2022-08-27 ENCOUNTER — Encounter: Payer: Self-pay | Admitting: Cardiovascular Disease

## 2022-09-10 ENCOUNTER — Ambulatory Visit: Payer: Medicare HMO | Attending: Physician Assistant | Admitting: Physician Assistant

## 2022-09-10 NOTE — Progress Notes (Signed)
This encounter was created in error - please disregard.

## 2022-09-22 DIAGNOSIS — N1832 Chronic kidney disease, stage 3b: Secondary | ICD-10-CM | POA: Diagnosis not present

## 2022-09-22 DIAGNOSIS — R809 Proteinuria, unspecified: Secondary | ICD-10-CM | POA: Diagnosis not present

## 2022-09-22 DIAGNOSIS — I5042 Chronic combined systolic (congestive) and diastolic (congestive) heart failure: Secondary | ICD-10-CM | POA: Diagnosis not present

## 2022-09-22 DIAGNOSIS — I129 Hypertensive chronic kidney disease with stage 1 through stage 4 chronic kidney disease, or unspecified chronic kidney disease: Secondary | ICD-10-CM | POA: Diagnosis not present

## 2022-09-22 DIAGNOSIS — E213 Hyperparathyroidism, unspecified: Secondary | ICD-10-CM | POA: Diagnosis not present

## 2022-09-22 DIAGNOSIS — I701 Atherosclerosis of renal artery: Secondary | ICD-10-CM | POA: Diagnosis not present

## 2022-09-24 ENCOUNTER — Other Ambulatory Visit: Payer: Self-pay | Admitting: Emergency Medicine

## 2022-09-25 DIAGNOSIS — E785 Hyperlipidemia, unspecified: Secondary | ICD-10-CM | POA: Diagnosis not present

## 2022-09-25 DIAGNOSIS — I70213 Atherosclerosis of native arteries of extremities with intermittent claudication, bilateral legs: Secondary | ICD-10-CM | POA: Diagnosis not present

## 2022-09-25 DIAGNOSIS — I739 Peripheral vascular disease, unspecified: Secondary | ICD-10-CM | POA: Diagnosis not present

## 2022-09-25 DIAGNOSIS — Z7982 Long term (current) use of aspirin: Secondary | ICD-10-CM | POA: Diagnosis not present

## 2022-09-25 DIAGNOSIS — I4891 Unspecified atrial fibrillation: Secondary | ICD-10-CM | POA: Diagnosis not present

## 2022-09-25 DIAGNOSIS — I2510 Atherosclerotic heart disease of native coronary artery without angina pectoris: Secondary | ICD-10-CM | POA: Diagnosis not present

## 2022-09-25 DIAGNOSIS — I129 Hypertensive chronic kidney disease with stage 1 through stage 4 chronic kidney disease, or unspecified chronic kidney disease: Secondary | ICD-10-CM | POA: Diagnosis not present

## 2022-09-25 DIAGNOSIS — Z79899 Other long term (current) drug therapy: Secondary | ICD-10-CM | POA: Diagnosis not present

## 2022-09-25 DIAGNOSIS — N183 Chronic kidney disease, stage 3 unspecified: Secondary | ICD-10-CM | POA: Diagnosis not present

## 2022-09-25 DIAGNOSIS — T82856A Stenosis of peripheral vascular stent, initial encounter: Secondary | ICD-10-CM | POA: Diagnosis not present

## 2022-09-28 ENCOUNTER — Encounter: Payer: Self-pay | Admitting: Registered Nurse

## 2022-10-01 ENCOUNTER — Other Ambulatory Visit: Payer: Self-pay | Admitting: Emergency Medicine

## 2022-10-23 ENCOUNTER — Encounter: Payer: Self-pay | Admitting: Emergency Medicine

## 2022-11-12 ENCOUNTER — Other Ambulatory Visit: Payer: Self-pay | Admitting: Cardiovascular Disease

## 2022-12-04 DIAGNOSIS — M47896 Other spondylosis, lumbar region: Secondary | ICD-10-CM | POA: Diagnosis not present

## 2022-12-07 DIAGNOSIS — M545 Low back pain, unspecified: Secondary | ICD-10-CM | POA: Diagnosis not present

## 2022-12-07 DIAGNOSIS — M47896 Other spondylosis, lumbar region: Secondary | ICD-10-CM | POA: Diagnosis not present

## 2022-12-08 ENCOUNTER — Other Ambulatory Visit (HOSPITAL_COMMUNITY): Payer: Self-pay | Admitting: Anesthesiology

## 2022-12-08 DIAGNOSIS — M545 Low back pain, unspecified: Secondary | ICD-10-CM

## 2022-12-14 ENCOUNTER — Other Ambulatory Visit: Payer: Self-pay | Admitting: Anesthesiology

## 2022-12-14 ENCOUNTER — Encounter (HOSPITAL_COMMUNITY): Payer: Self-pay | Admitting: Anesthesiology

## 2022-12-14 DIAGNOSIS — M545 Low back pain, unspecified: Secondary | ICD-10-CM

## 2022-12-25 NOTE — Discharge Instructions (Signed)
Myelogram Discharge Instructions  Go home and rest quietly as needed. You may resume normal activities; however, do not exert yourself strongly or do any heavy lifting today and tomorrow.   DO NOT drive today.    You may resume your normal diet and medications unless otherwise indicated. Drink lots of extra fluids today and tomorrow.   The incidence of headache, nausea, or vomiting is about 5% (one in 20 patients).  If you develop a headache, lie flat for 24 hours and drink plenty of fluids until the headache goes away.  Caffeinated beverages may be helpful. If when you get up you still have a headache when standing, go back to bed and force fluids for another 24 hours.   If you develop severe nausea and vomiting or a headache that does not go away with the flat bedrest after 48 hours, please call [phone].   Call your physician for a follow-up appointment.  The results of your myelogram will be sent directly to your physician by the following day.  If you have any questions or if complications develop after you arrive home, please call [phone].  Discharge instructions have been explained to the patient.  The patient, or the person responsible for the patient, fully understands these instructions.   Thank you for visiting our office today.   

## 2022-12-28 ENCOUNTER — Ambulatory Visit
Admission: RE | Admit: 2022-12-28 | Discharge: 2022-12-28 | Disposition: A | Discharge status: Home / Self Care | Payer: Medicare HMO | Source: Ambulatory Visit | Attending: Anesthesiology | Admitting: Anesthesiology

## 2022-12-28 DIAGNOSIS — I700 Atherosclerosis of aorta: Secondary | ICD-10-CM | POA: Diagnosis not present

## 2022-12-28 DIAGNOSIS — M4186 Other forms of scoliosis, lumbar region: Secondary | ICD-10-CM | POA: Diagnosis not present

## 2022-12-28 DIAGNOSIS — M47816 Spondylosis without myelopathy or radiculopathy, lumbar region: Secondary | ICD-10-CM | POA: Diagnosis not present

## 2022-12-28 DIAGNOSIS — M545 Low back pain, unspecified: Secondary | ICD-10-CM

## 2022-12-28 MED ORDER — ONDANSETRON HCL 4 MG/2ML IJ SOLN: 4.0000 mg | Freq: Once | INTRAMUSCULAR | Status: DC | PRN

## 2022-12-28 MED ORDER — IOPAMIDOL (ISOVUE-M 200) INJECTION 41%
20.0000 mL | Freq: Once | INTRAMUSCULAR | Status: AC
  Administered 2022-12-28: 20 mL via INTRATHECAL

## 2022-12-28 MED ORDER — MEPERIDINE HCL 50 MG/ML IJ SOLN: 50.0000 mg | Freq: Once | INTRAMUSCULAR | Status: DC | PRN

## 2022-12-28 MED ORDER — DIAZEPAM 5 MG PO TABS
5.0000 mg | ORAL_TABLET | Freq: Once | ORAL | Status: AC
Start: 2022-12-28 — End: 2022-12-28
  Administered 2022-12-28: 5 mg via ORAL

## 2023-01-19 ENCOUNTER — Encounter: Payer: Self-pay | Admitting: Cardiovascular Disease

## 2023-01-19 DIAGNOSIS — E785 Hyperlipidemia, unspecified: Secondary | ICD-10-CM

## 2023-01-19 NOTE — Telephone Encounter (Signed)
Unfortunately the ezetimibe is a very weak cholesterol reducing agent and will not be nearly enough.  If the rosuvastatin is causing body aches, we can try stopping it and using a combination of Repatha and Zetia 10 mg once daily, then recheck the lipids in 3 months.  The Zetia rarely causes muscle aches and the Repatha does not cause this. There is an alternative for Repatha called Leqvio that is a shot every 6 months, but has more challenging insurance coverage.  Will ask our pharmacy team to please look into whether or not this would be a good alternative for her Repatha.

## 2023-01-25 ENCOUNTER — Ambulatory Visit (INDEPENDENT_AMBULATORY_CARE_PROVIDER_SITE_OTHER): Payer: Medicare HMO

## 2023-01-25 DIAGNOSIS — I495 Sick sinus syndrome: Secondary | ICD-10-CM

## 2023-01-27 LAB — CUP PACEART REMOTE DEVICE CHECK
Battery Impedance: 2200 Ohm
Battery Remaining Longevity: 31 mo
Battery Voltage: 2.75 V
Brady Statistic AP VP Percent: 0 %
Brady Statistic AP VS Percent: 99 %
Brady Statistic AS VP Percent: 0 %
Brady Statistic AS VS Percent: 0 %
Date Time Interrogation Session: 20241218101744
Implantable Lead Connection Status: 753985
Implantable Lead Connection Status: 753985
Implantable Lead Implant Date: 20131212
Implantable Lead Implant Date: 20131212
Implantable Lead Location: 753859
Implantable Lead Location: 753860
Implantable Lead Model: 5076
Implantable Lead Model: 5076
Implantable Pulse Generator Implant Date: 20131212
Lead Channel Impedance Value: 438 Ohm
Lead Channel Impedance Value: 696 Ohm
Lead Channel Pacing Threshold Amplitude: 0.625 V
Lead Channel Pacing Threshold Amplitude: 0.875 V
Lead Channel Pacing Threshold Pulse Width: 0.4 ms
Lead Channel Pacing Threshold Pulse Width: 0.4 ms
Lead Channel Setting Pacing Amplitude: 2 V
Lead Channel Setting Pacing Amplitude: 2.5 V
Lead Channel Setting Pacing Pulse Width: 0.4 ms
Lead Channel Setting Sensing Sensitivity: 4 mV
Zone Setting Status: 755011
Zone Setting Status: 755011

## 2023-01-28 ENCOUNTER — Other Ambulatory Visit (HOSPITAL_COMMUNITY): Payer: Self-pay

## 2023-01-28 MED ORDER — EZETIMIBE 10 MG PO TABS: 10.0000 mg | ORAL_TABLET | Freq: Every day | ORAL | 3 refills | Status: DC

## 2023-01-28 MED ORDER — EZETIMIBE 10 MG PO TABS
10.0000 mg | ORAL_TABLET | Freq: Every day | ORAL | 3 refills | Status: DC
  Filled 2023-01-28: qty 90, 90d supply, fill #0

## 2023-01-28 NOTE — Addendum Note (Signed)
Addended by: Tobin Chad on: 01/28/2023 09:10 AM   Modules accepted: Orders

## 2023-02-15 DIAGNOSIS — M47896 Other spondylosis, lumbar region: Secondary | ICD-10-CM | POA: Diagnosis not present

## 2023-03-01 NOTE — Progress Notes (Signed)
Remote pacemaker transmission.   

## 2023-03-01 NOTE — Addendum Note (Signed)
Addended by: Geralyn Flash D on: 03/01/2023 09:39 AM   Modules accepted: Orders

## 2023-03-25 ENCOUNTER — Encounter (HOSPITAL_BASED_OUTPATIENT_CLINIC_OR_DEPARTMENT_OTHER): Payer: Self-pay

## 2023-03-25 ENCOUNTER — Other Ambulatory Visit: Payer: Self-pay

## 2023-03-25 ENCOUNTER — Emergency Department (HOSPITAL_BASED_OUTPATIENT_CLINIC_OR_DEPARTMENT_OTHER): Payer: Medicare Other | Admitting: Radiology

## 2023-03-25 ENCOUNTER — Emergency Department (HOSPITAL_BASED_OUTPATIENT_CLINIC_OR_DEPARTMENT_OTHER)
Admission: EM | Admit: 2023-03-25 | Discharge: 2023-03-26 | Disposition: A | Discharge status: Home / Self Care | Payer: Medicare Other | Attending: Emergency Medicine | Admitting: Emergency Medicine

## 2023-03-25 DIAGNOSIS — Z7982 Long term (current) use of aspirin: Secondary | ICD-10-CM | POA: Insufficient documentation

## 2023-03-25 DIAGNOSIS — Z950 Presence of cardiac pacemaker: Secondary | ICD-10-CM | POA: Diagnosis not present

## 2023-03-25 DIAGNOSIS — I2510 Atherosclerotic heart disease of native coronary artery without angina pectoris: Secondary | ICD-10-CM | POA: Diagnosis not present

## 2023-03-25 DIAGNOSIS — R079 Chest pain, unspecified: Secondary | ICD-10-CM | POA: Diagnosis not present

## 2023-03-25 DIAGNOSIS — J101 Influenza due to other identified influenza virus with other respiratory manifestations: Secondary | ICD-10-CM | POA: Diagnosis not present

## 2023-03-25 DIAGNOSIS — E039 Hypothyroidism, unspecified: Secondary | ICD-10-CM | POA: Diagnosis not present

## 2023-03-25 DIAGNOSIS — J111 Influenza due to unidentified influenza virus with other respiratory manifestations: Secondary | ICD-10-CM

## 2023-03-25 DIAGNOSIS — Z20822 Contact with and (suspected) exposure to covid-19: Secondary | ICD-10-CM | POA: Diagnosis not present

## 2023-03-25 DIAGNOSIS — N189 Chronic kidney disease, unspecified: Secondary | ICD-10-CM | POA: Insufficient documentation

## 2023-03-25 DIAGNOSIS — R0602 Shortness of breath: Secondary | ICD-10-CM | POA: Diagnosis not present

## 2023-03-25 LAB — CBC
HCT: 42.6 % (ref 36.0–46.0)
Hemoglobin: 14.7 g/dL (ref 12.0–15.0)
MCH: 30.6 pg (ref 26.0–34.0)
MCHC: 34.5 g/dL (ref 30.0–36.0)
MCV: 88.8 fL (ref 80.0–100.0)
Platelets: 220 10*3/uL (ref 150–400)
RBC: 4.8 MIL/uL (ref 3.87–5.11)
RDW: 13.3 % (ref 11.5–15.5)
WBC: 6.5 10*3/uL (ref 4.0–10.5)
nRBC: 0 % (ref 0.0–0.2)

## 2023-03-25 LAB — BASIC METABOLIC PANEL
Anion gap: 11 (ref 5–15)
BUN: 12 mg/dL (ref 8–23)
CO2: 26 mmol/L (ref 22–32)
Calcium: 9 mg/dL (ref 8.9–10.3)
Chloride: 96 mmol/L — ABNORMAL LOW (ref 98–111)
Creatinine, Ser: 1.2 mg/dL — ABNORMAL HIGH (ref 0.44–1.00)
GFR, Estimated: 45 mL/min — ABNORMAL LOW (ref >60)
Glucose, Bld: 94 mg/dL (ref 70–99)
Potassium: 4.2 mmol/L (ref 3.5–5.1)
Sodium: 133 mmol/L — ABNORMAL LOW (ref 135–145)

## 2023-03-25 LAB — RESP PANEL BY RT-PCR (RSV, FLU A&B, COVID)  RVPGX2
Influenza A by PCR: POSITIVE — AB
Influenza B by PCR: NEGATIVE
Resp Syncytial Virus by PCR: NEGATIVE
SARS Coronavirus 2 by RT PCR: NEGATIVE

## 2023-03-25 LAB — TROPONIN I (HIGH SENSITIVITY)
Troponin I (High Sensitivity): 10 ng/L (ref <18)
Troponin I (High Sensitivity): 9 ng/L (ref <18)

## 2023-03-25 MED ORDER — OSELTAMIVIR PHOSPHATE 30 MG PO CAPS: 30.0000 mg | ORAL_CAPSULE | Freq: Two times a day (BID) | ORAL | 0 refills | Status: DC

## 2023-03-25 MED ORDER — SODIUM CHLORIDE 0.9 % IV BOLUS
1000.0000 mL | Freq: Once | INTRAVENOUS | Status: AC
  Administered 2023-03-25: 1000 mL via INTRAVENOUS

## 2023-03-25 NOTE — ED Provider Notes (Signed)
[place] EMERGENCY DEPARTMENT AT [facility] Provider Note   CSN: [id number] Arrival date & time: 03/25/23  2032     History  Chief Complaint  Patient presents with   Shortness of Breath   Chest Pain    [name] is a 83 y.o. female.   Shortness of Breath Associated symptoms: chest pain   Chest Pain Associated symptoms: shortness of breath   83 year old female history of CAD, CKD, depression, anxiety, hypothyroidism, atrial fibrillation/flutter presenting for chest pain, shortness of breath.  She reports has been sick since Monday with fatigue, chills, cough, runny nose.  Over last few days she has had some stabbing left-sided nonradiating chest pain she thinks due to cough.  Not pleuritic.  She has no pain currently.  She denies any new back pain but does have chronic back pain not related to her chest pain.  No urinary symptoms.  No belly pain or vomiting or diarrhea.  She is eating and drinking.     Home Medications Prior to Admission medications   Medication Sig Start Date End Date Taking? Authorizing Provider  oseltamivir (TAMIFLU) 30 MG capsule Take 1 capsule (30 mg total) by mouth 2 (two) times daily. 03/25/23  Yes Laurence Spates, MD  amLODipine (NORVASC) 5 MG tablet TAKE 1 TABLET (5 MG TOTAL) BY MOUTH IN THE MORNING AND AT BEDTIME 11/12/22   Croitoru, Mihai, MD  ascorbic acid (VITAMIN C) 250 MG CHEW Chew 750 mg by mouth daily.    [provider]  aspirin EC 81 MG tablet Take 81 mg by mouth daily.     [provider]  Biotin 95621 MCG TABS Take 10,000 mcg by mouth daily.    [provider]  carvedilol (COREG) 6.25 MG tablet TAKE 1 TABLET BY MOUTH TWICE A DAY WITH FOOD 11/12/22   Croitoru, Mihai, MD  Cholecalciferol (VITAMIN D) 50 MCG (2000 UT) tablet Take 2,000 Units by mouth daily.    [provider]  cilostazol (PLETAL) 50 MG tablet Take 1 tablet (50 mg total) by mouth 2 (two) times daily. 06/10/22   Runell Gess, MD   citalopram (CELEXA) 40 MG tablet TAKE 1 TABLET BY MOUTH EVERY DAY 10/01/22   Georgina Quint, MD  Cyanocobalamin (B-12) 5000 MCG CAPS Take 10,000 mcg by mouth daily.    [provider]  diclofenac (VOLTAREN) 75 MG EC tablet Take 1 tablet (75 mg total) by mouth 2 (two) times daily. 04/06/22   Georgina Quint, MD  dofetilide (TIKOSYN) 125 MCG capsule TAKE 1 CAPSULE BY MOUTH EVERY 12 HOURS. (GENERIC FOR TIKOSYN) 03/09/22   Croitoru, Mihai, MD  Evolocumab (REPATHA SURECLICK) 140 MG/ML SOAJ Inject 140 mg into the skin every 14 (fourteen) days. 02/13/22   Croitoru, Mihai, MD  ezetimibe (ZETIA) 10 MG tablet Take 1 tablet (10 mg total) by mouth daily. 01/28/23 04/28/23  Croitoru, Mihai, MD  loteprednol (LOTEMAX) 0.5 % ophthalmic suspension Place 2 drops into both eyes See admin instructions. Instill 2 drops into both eyes 2 times a day twice weekly    [provider]  Magnesium 200 MG TABS Take 200 mg by mouth daily.    [provider]  Multiple Vitamin (MULTIVITAMIN WITH MINERALS) TABS tablet Take 1 tablet by mouth 2 (two) times daily.    [provider]  Multiple Vitamins-Minerals (ZINC PO) Take 150 mg by mouth daily.    [provider]  nitroGLYCERIN (NITROSTAT) 0.4 MG SL tablet Place 1 tablet (0.4 mg  total) under the tongue every 5 (five) minutes x 3 doses as needed for chest pain. 05/15/15   Robbie Lis M, PA-C  rosuvastatin (CRESTOR) 10 MG tablet Take 1 tablet (10 mg total) by mouth daily. 02/10/22   Croitoru, Mihai, MD  VITAMIN D-VITAMIN K PO Take 1 capsule by mouth daily.    [provider]      Allergies    Brilinta [ticagrelor], Atorvastatin, Clonidine derivatives, Crestor [rosuvastatin calcium], Exforge [amlodipine besylate-valsartan], and Spironolactone    Review of Systems   Review of Systems  Respiratory:  Positive for shortness of breath.   Cardiovascular:  Positive for chest pain.  Review of systems completed and notable  as per HPI.  ROS otherwise negative.   Physical Exam Updated Vital Signs BP (!) 153/67   Pulse 78   Temp 99.1 F (37.3 C) (Oral)   Resp 19   Ht 5\' 3"  (1.6 m)   Wt 66.7 kg   SpO2 95%   BMI 26.05 kg/m  Physical Exam Vitals and nursing note reviewed.  Constitutional:      General: She is not in acute distress.    Appearance: She is well-developed.  HENT:     Head: Normocephalic and atraumatic.     Nose: Nose normal.     Mouth/Throat:     Mouth: Mucous membranes are moist.     Pharynx: Oropharynx is clear.  Eyes:     Extraocular Movements: Extraocular movements intact.     Conjunctiva/sclera: Conjunctivae normal.     Pupils: Pupils are equal, round, and reactive to light.  Cardiovascular:     Rate and Rhythm: Normal rate and regular rhythm.     Pulses: Normal pulses.     Heart sounds: Normal heart sounds. No murmur heard. Pulmonary:     Effort: Pulmonary effort is normal. No respiratory distress.     Breath sounds: Normal breath sounds.  Abdominal:     Palpations: Abdomen is soft.     Tenderness: There is no abdominal tenderness.  Musculoskeletal:        General: No swelling.     Cervical back: Normal range of motion and neck supple. No rigidity or tenderness.     Right lower leg: No edema.     Left lower leg: No edema.  Skin:    General: Skin is warm and dry.     Capillary Refill: Capillary refill takes less than 2 seconds.  Neurological:     General: No focal deficit present.     Mental Status: She is alert and oriented to person, place, and time. Mental status is at baseline.  Psychiatric:        Mood and Affect: Mood normal.     ED Results / Procedures / Treatments   Labs (all labs ordered are listed, but only abnormal results are displayed) Labs Reviewed  RESP PANEL BY RT-PCR (RSV, FLU A&B, COVID)  RVPGX2 - Abnormal; Notable for the following components:      Result Value   Influenza A by PCR POSITIVE (*)    All other components within normal limits   BASIC METABOLIC PANEL - Abnormal; Notable for the following components:   Sodium 133 (*)    Chloride 96 (*)    Creatinine, Ser 1.20 (*)    GFR, Estimated 45 (*)    All other components within normal limits  CBC  TROPONIN I (HIGH SENSITIVITY)  TROPONIN I (HIGH SENSITIVITY)    EKG EKG Interpretation Date/Time:  Thursday March 25 2023 20:53:21 EST Ventricular Rate:  76 PR Interval:  184 QRS Duration:  82 QT Interval:  430 QTC Calculation: 483 R Axis:   81  Text Interpretation: Atrial-paced rhythm Nonspecific ST abnormality Abnormal ECG When compared with ECG of 07-Nov-2020 11:22, ST now depressed in Anterior leads T wave inversion less evident in Anterior leads Confirmed by Fulton Reek [phone]) on 03/25/2023 9:09:40 PM  Radiology DG Chest 2 View Result Date: 03/25/2023 CLINICAL DATA:  Chest pain EXAM: CHEST - 2 VIEW COMPARISON:  X-ray 12/20/2019 FINDINGS: Sternal wires. Left atrial occlusion clip. Stable cardiopericardial silhouette. Left upper chest pacemaker with 2 leads along the right side of the heart. No consolidation, pneumothorax or effusion. No edema. Hyperinflation. Degenerative changes of the spine. Mild compression deformity of a lower thoracic spine vertebral level. Based on appearance likely chronic. IMPRESSION: Postop chest with pacemaker.  Hyperinflation Electronically Signed   By: Karen Kays M.D.   On: 03/25/2023 21:14    Procedures Procedures    Medications Ordered in ED Medications  sodium chloride 0.9 % bolus 1,000 mL (0 mLs Intravenous Stopped 03/25/23 2304)    ED Course/ Medical Decision Making/ A&P Clinical Course as of 03/25/23 2346  Thu Mar 25, 2023  2331 Patient pain-free.  She is flu positive which is suspect is causing her symptoms.  Delta troponin pending. [JD]    Clinical Course User Index [JD] Laurence Spates, MD                                 Medical Decision Making Amount and/or Complexity of Data Reviewed Labs:  ordered. Radiology: ordered.  Risk Prescription drug management.   Medical Decision Making:   [name] is a 83 y.o. female who presented to the ED today with chest pain, shortness of breath, recent URI symptoms.  Vital signs stable.  Exam she is well-appearing.  EKG is nonischemic.  Troponin is negative will trend.  She is flu positive which consistent with her symptoms and I suspect her pain is due to cough.  I have low suspicion for PE, dissection.  Patient placed on continuous vitals and telemetry monitoring while in ED which was reviewed periodically.  Reviewed and confirmed nursing documentation for past medical history, family history, social history.  Reassessment and Plan:   Patient pain-free.  She is flu positive which is suspect is causing her symptoms.  Delta troponin pending.  Repeat troponin is negative.  I suspect her symptoms are due to fluid.  After fluid she is feeling well.  Tolerant p.o.  Chest pain free.  Recommend she follow close with her PCP and cardiologist.  She is comfortable with this plan.  Return precautions given.  Patient's presentation is most consistent with acute complicated illness / injury requiring diagnostic workup.         Final Clinical Impression(s) / ED Diagnoses Final diagnoses:  Influenza    Rx / DC Orders ED Discharge Orders          Ordered    oseltamivir (TAMIFLU) 30 MG capsule  2 times daily        03/25/23 2332              Laurence Spates, MD 03/25/23 [phone]

## 2023-03-25 NOTE — Discharge Instructions (Signed)
You tested positive for influenza.  You are being treated with Tamiflu.  You should drink plenty of fluids and follow close with a primary care doctor.  If you develop worsening chest pain, difficulty breathing or any other new concerning symptoms you should return to the ED.

## 2023-03-25 NOTE — ED Triage Notes (Signed)
Pt reports she is here today due to chest pain,sob. Pt also reports mid back pain that feels like a stabbing sensation that radiates to her chest. Pt reports it started Tuesday. Pt has extensive cardiac history. Pt also reports cough.

## 2023-03-26 MED ORDER — OSELTAMIVIR PHOSPHATE 30 MG PO CAPS: 30.0000 mg | ORAL_CAPSULE | Freq: Two times a day (BID) | ORAL | 0 refills | Status: DC

## 2023-03-29 ENCOUNTER — Telehealth: Payer: Self-pay

## 2023-03-29 NOTE — Transitions of Care (Post Inpatient/ED Visit) (Signed)
03/29/2023  Name: [name] MRN: [id number] DOB: 08-30-1940  Today's TOC FU Call Status:   Patient's Name and Date of Birth confirmed.  Transition Care Management Follow-up Telephone Call Date of Discharge: 03/26/23 Discharge Facility: [facility] ([facility]) Type of Discharge: Emergency Department Reason for ED Visit: Other: (Flu) How have you been since you were released from the hospital?: Better Any questions or concerns?: No  Items Reviewed: Did you receive and understand the discharge instructions provided?: Yes Medications obtained,verified, and reconciled?: Yes (Medications Reviewed) Any new allergies since your discharge?: No Dietary orders reviewed?: NA Do you have support at home?: No  Medications Reviewed Today: Medications Reviewed Today     Reviewed by Marinus Maw, CMA (Certified Medical Assistant) on 03/29/23 at 1415  Med List Status: <None>   Medication Order Taking? Sig Documenting Provider Last Dose Status Informant  amLODipine (NORVASC) 5 MG tablet [id number] Yes TAKE 1 TABLET (5 MG TOTAL) BY MOUTH IN THE MORNING AND AT BEDTIME Croitoru, Mihai, MD Taking Active   ascorbic acid (VITAMIN C) 250 MG CHEW [id number] Yes Chew 750 mg by mouth daily. [provider] Taking Active Self  aspirin EC 81 MG tablet [id number] Yes Take 81 mg by mouth daily.  [provider] Taking Active Self  Biotin 62952 MCG TABS [id number] Yes Take 10,000 mcg by mouth daily. [provider] Taking Active Self  carvedilol (COREG) 6.25 MG tablet [id number] Yes TAKE 1 TABLET BY MOUTH TWICE A DAY WITH FOOD Croitoru, Mihai, MD Taking Active   Cholecalciferol (VITAMIN D) 50 MCG (2000 UT) tablet [id number] Yes Take 2,000 Units by mouth daily. [provider] Taking Active Self  cilostazol (PLETAL) 50 MG tablet [id number] Yes Take 1 tablet (50 mg total) by mouth 2 (two) times daily. Runell Gess, MD Taking Active   citalopram (CELEXA) 40 MG tablet  [id number] Yes TAKE 1 TABLET BY MOUTH EVERY DAY Georgina Quint, MD Taking Active   Cyanocobalamin (B-12) 5000 MCG CAPS [id number] Yes Take 10,000 mcg by mouth daily. [provider] Taking Active Self  diclofenac (VOLTAREN) 75 MG EC tablet [id number] Yes Take 1 tablet (75 mg total) by mouth 2 (two) times daily. Georgina Quint, MD Taking Active   dofetilide [facility]) 125 MCG capsule [id number] Yes TAKE 1 CAPSULE BY MOUTH EVERY 12 HOURS. (GENERIC FOR Joice Lofts) Croitoru, Mihai, MD Taking Active   Evolocumab (REPATHA SURECLICK) 140 MG/ML SOAJ [id number] Yes Inject 140 mg into the skin every 14 (fourteen) days. Croitoru, Mihai, MD Taking Active   ezetimibe (ZETIA) 10 MG tablet [id number] Yes Take 1 tablet (10 mg total) by mouth daily. Croitoru, Mihai, MD Taking Active   loteprednol (LOTEMAX) 0.5 % ophthalmic suspension [id number] Yes Place 2 drops into both eyes See admin instructions. Instill 2 drops into both eyes 2 times a day twice weekly [provider] Taking Active Self  Magnesium 200 MG TABS [id number] Yes Take 200 mg by mouth daily. [provider] Taking Active Self  Multiple Vitamin (MULTIVITAMIN WITH MINERALS) TABS tablet [id number] Yes Take 1 tablet by mouth 2 (two) times daily. [provider] Taking Active Self  Multiple Vitamins-Minerals (ZINC PO) [id number] Yes Take 150 mg by mouth daily. [provider] Taking Active Self  nitroGLYCERIN (NITROSTAT) 0.4 MG SL tablet [id number] Yes Place 1 tablet (0.4 mg total) under the tongue every 5 (five) minutes x 3 doses as needed for chest pain. Allayne Butcher, PA-C Taking Active Self  Med Note [facility], CARLOS A   Wed Jun 10, 2022  2:19 PM)    oseltamivir (TAMIFLU) 30 MG capsule [id number] Yes Take 1 capsule (30 mg total) by mouth 2 (two) times daily. Laurence Spates, MD Taking Active   rosuvastatin (CRESTOR) 10 MG tablet [id number] Yes Take 1 tablet (10 mg total) by mouth daily.  Croitoru, Mihai, MD Taking Active   VITAMIN D-VITAMIN K PO [id number] Yes Take 1 capsule by mouth daily. [provider] Taking Active Self            Home Care and Equipment/Supplies: Were Home Health Services Ordered?: No Any new equipment or medical supplies ordered?: No  Functional Questionnaire: Do you need assistance with bathing/showering or dressing?: No Do you need assistance with meal preparation?: No Do you need assistance with eating?: No Do you have difficulty maintaining continence: No Do you need assistance with getting out of bed/getting out of a chair/moving?: No Do you have difficulty managing or taking your medications?: No  Follow up appointments reviewed: PCP Follow-up appointment confirmed?: NA Specialist Hospital Follow-up appointment confirmed?: NA Do you need transportation to your follow-up appointment?: No Do you understand care options if your condition(s) worsen?: Yes-patient verbalized understanding    SIGNATURE: Elyse Jarvis, CMA

## 2023-04-01 ENCOUNTER — Other Ambulatory Visit: Payer: Self-pay | Admitting: Emergency Medicine

## 2023-04-05 ENCOUNTER — Ambulatory Visit: Payer: Self-pay | Admitting: Emergency Medicine

## 2023-04-05 ENCOUNTER — Other Ambulatory Visit: Payer: Self-pay | Admitting: Cardiovascular Disease

## 2023-04-05 NOTE — Telephone Encounter (Signed)
 Chief Complaint: Sinus pain Symptoms: Left facial pain - behind left eye, left eyebrow/forehead, left cheek, cough Frequency: Constant Pertinent Negatives: Patient denies Headache, fever, Swelling, redness, vision changes Disposition: [] ED /[] Urgent Care (no appt availability in office) / [x] Appointment(In office/virtual)/ []  [facility]/ [] Home Care/ [] Refused Recommended Disposition /[] [facility]/ []  Follow-up with PCP Additional Notes: Patient called with complaints of worsening sinus pain x2 weeks. Patient states that she was diagnoses with the flu two weeks ago after going to the ER and has been taking tamiflu. Patient states that flu symptoms have improved but now has moderate-severe sinus pain located on left side of face behind her left eye, around her left forehead, and her left cheek. Patient states she has tried Navage, mucinex, Careers adviser D, benzonatate, with only the Navage providing some relief. Patient states that the eye pain is a 7 without coughing and a 10 with, "When I cough it feels like someone stuck a stick in my eye." Patient states that she has a moderate cough that is productive with yellow sputum and but denies nasal congestion, stating "about every hour it feels full and I can blow a lot out." Nasal drainage therefore only present when blowing, yellow discharge. Patient denies left facial redness, swelling, vision changes, fever. Patient states took tylenol extra strength with some relief of pain. Patient advised by this RN to be seen within three days to which patient was agreeable. Appt scheduled. Patient advised by this RN to call back with worsening symptoms. Patient verbalized understanding  Copied from CRM [id number]. Topic: Clinical - Red Word Triage >> Apr 05, 2023 11:42 AM Adele Barthel wrote: Red Word that prompted transfer to Nurse Triage:   Has had flu for 2 weeks Was seen at ER last Thurs and was diagnosed with flu, discharged home with  Tamiflu Taking cough syrup, Delysum and Tessalon pearls. Is helping but feels she has now developed a sinus infection.  Left side of head is painful, above eye is tender and painful Has used nasal irrigation and has gotten some small drainage out. Feels she may need antibiotic Reason for Disposition  [1] Using nasal washes and pain medicine > 24 hours AND [2] sinus pain (around cheekbone or eye) persists  Answer Assessment - Initial Assessment Questions 1. LOCATION: "Where does it hurt?"      Left side of face (eyebrow, cheek, behind left eye) 2. ONSET: "When did the sinus pain start?"  (e.g., hours, days)      2 weeks "It started with my flu symptoms but now that my flu symptoms are better if feels like the sinus pain is getting worse) 3. SEVERITY: "How bad is the pain?"   (Scale 1-10; mild, moderate or severe)   - MILD (1-3): doesn't interfere with normal activities    - MODERATE (4-7): interferes with normal activities (e.g., work or school) or awakens from sleep   - SEVERE (8-10): excruciating pain and patient unable to do any normal activities        7 without coughing and 10 with cough "when I cough, it feels like someone stuck a stick in my eye."  4. RECURRENT SYMPTOM: "Have you ever had sinus problems before?" If Yes, ask: "When was the last time?" and "What happened that time?"      Denies 5. NASAL CONGESTION: "Is the nose blocked?" If Yes, ask: "Can you open it or must you breathe through your mouth?"     "I don't feel like I have  congestion but about every hour it feels full and I can then blow it and I get a lot of stuff out." 6. NASAL DISCHARGE: "Do you have discharge from your nose?" If so ask, "What color?"     Only when I blow - yellow  7. FEVER: "Do you have a fever?" If Yes, ask: "What is it, how was it measured, and when did it start?"      Denies (last 03/25/23) 8. OTHER SYMPTOMS: "Do you have any other symptoms?" (e.g., sore throat, cough, earache, difficulty  breathing)     Cough  Protocols used: Sinus Pain or Congestion-A-AH

## 2023-04-06 ENCOUNTER — Encounter: Payer: Self-pay | Admitting: Cardiovascular Disease

## 2023-04-06 MED ORDER — ROSUVASTATIN CALCIUM 10 MG PO TABS: 10.0000 mg | ORAL_TABLET | Freq: Every day | ORAL | 0 refills | Status: DC

## 2023-04-06 MED ORDER — CARVEDILOL 6.25 MG PO TABS: 6.2500 mg | ORAL_TABLET | Freq: Two times a day (BID) | ORAL | 0 refills | Status: DC

## 2023-04-06 MED ORDER — DOFETILIDE 125 MCG PO CAPS: ORAL_CAPSULE | ORAL | 0 refills | Status: DC

## 2023-04-06 MED ORDER — EZETIMIBE 10 MG PO TABS: 10.0000 mg | ORAL_TABLET | Freq: Every day | ORAL | 0 refills | Status: DC

## 2023-04-06 MED ORDER — AMLODIPINE BESYLATE 5 MG PO TABS: ORAL_TABLET | ORAL | 0 refills | Status: DC

## 2023-04-07 ENCOUNTER — Encounter: Payer: Self-pay | Admitting: Emergency Medicine

## 2023-04-07 ENCOUNTER — Telehealth (INDEPENDENT_AMBULATORY_CARE_PROVIDER_SITE_OTHER): Payer: Medicare Other | Admitting: Emergency Medicine

## 2023-04-07 DIAGNOSIS — R6889 Other general symptoms and signs: Secondary | ICD-10-CM | POA: Insufficient documentation

## 2023-04-07 DIAGNOSIS — J0100 Acute maxillary sinusitis, unspecified: Secondary | ICD-10-CM | POA: Insufficient documentation

## 2023-04-07 MED ORDER — AMOXICILLIN-POT CLAVULANATE 875-125 MG PO TABS
1.0000 | ORAL_TABLET | Freq: Two times a day (BID) | ORAL | 0 refills | Status: AC
Start: 2023-04-07 — End: 2023-04-14

## 2023-04-07 NOTE — Assessment & Plan Note (Signed)
 Mostly headache and congestion Symptom management discussed Pain management discussed May take Tylenol and or Advil as needed Advised to rest and stay well-hydrated Advised to contact the office if no better or worse during the next several days

## 2023-04-07 NOTE — Progress Notes (Signed)
 Telemedicine Encounter- SOAP NOTE Established Patient MyChart video encounter Patient: Home  Provider: Office   Patient present only  This video encounter was conducted with the patient's (or proxy's) verbal consent via video telecommunications: yes/no: Yes Patient was instructed to have this encounter in a suitably private space; and to only have persons present to whom they give permission to participate. In addition, patient identity was confirmed by use of name plus two identifiers (DOB and address).  Chief complaint: Possible sinus infection Subjective  [name] is a 83 y.o. female established patient.  Visit today complaining of possible sinus infection Flulike symptoms for couple of weeks.  Recently diagnosed with influenza and started on Tamiflu Still complaining of persistent headaches and sinus congestion.  Denies fever or chills.  Able to eat and drink.  Denies nausea or vomiting. Denies abdominal pain or diarrhea.  HPI ? Patient Active Problem List   Diagnosis Date Noted   Lower respiratory infection 03/17/2022   Moderate episode of recurrent major depressive disorder (HCC) 10/07/2021   Lack of energy 10/07/2021   Compression fracture of T12 vertebra (HCC) 05/28/2021   Chronic neck pain 04/07/2021   Chronic bilateral low back pain without sciatica 04/07/2021   Exertional angina (HCC)    Renal artery stenosis (HCC) 06/26/2020   Cervical spine disease 05/30/2020   Statin intolerance 11/22/2019   Aortic atherosclerosis (HCC) 11/22/2019   Hypothyroidism (acquired) 02/08/2018   Coronary artery disease involving coronary bypass graft of native heart without angina pectoris 10/22/2017   Dyslipidemia 10/22/2017   Cerebrovascular small vessel disease 04/27/2017   Cerebrovascular disease 03/25/2017   Claudication in peripheral vascular disease (HCC) 02/15/2017   Hyperparathyroidism (HCC) 05/13/2016   Cardiac pacemaker in situ 01/10/2016   Benign neoplasm of ascending  colon    Long term current use of anticoagulant    CKD (chronic kidney disease), stage II    Chronic diastolic heart failure (HCC) 10/19/2015   Atypical atrial flutter (HCC)    Paroxysmal atrial flutter (HCC) 08/23/2015   Chronic fatigue 06/27/2015   Stented coronary artery    S/P CABG x 3 01/04/15 01/10/2015   CAD (coronary artery disease) 01/04/2015   PAF (paroxysmal atrial fibrillation) (HCC) 01/22/2012   Sinus node dysfunction (HCC) 01/22/2012   S/P placement of cardiac pacemaker, medtronic adapta 01/21/12 01/22/2012   PVD (peripheral vascular disease), hx stents to bil SFAs 02/2010 01/22/2012   Hyperlipidemia 12/31/2009   Anxiety 12/31/2009   DEPRESSION 12/31/2009   Essential hypertension 12/31/2009   Coronary atherosclerosis 12/31/2009   OSTEOARTHRITIS, HAND 12/31/2009   DISC DISEASE, CERVICAL 12/31/2009   DISC DISEASE, LUMBAR 12/31/2009   Past Medical History:  Diagnosis Date   ANXIETY 12/31/2009   Aortic insufficiency    a. mod by echo 2017.   CAD (coronary artery disease)    a. s/p CABGx3 and LAA clipping in 12/2014, NSTEMI 05/2015 s/p DES to native RCA and SVG-dRCA; occluded ramus-SVG was treated medically). c. neg nuc 10/2015 at [facility].   Chronic diastolic CHF (congestive heart failure) (HCC)    Chronic fatigue    CKD (chronic kidney disease), stage II    DEPRESSION 12/31/2009   DISC DISEASE, CERVICAL 12/31/2009   DISC DISEASE, LUMBAR 12/31/2009   DIVERTICULITIS, HX OF 12/31/2009   Essential hypertension    Gait abnormality 03/25/2017   GLUCOSE INTOLERANCE 12/31/2009   Habitual alcohol use    History of stroke    self reports " they say i have had some pin strokes"    Hypercalcemia  Hyperkalemia    Hyperlipidemia 12/31/2009   Hypothyroidism    MENOPAUSE, EARLY 12/31/2009   NSTEMI (non-ST elevated myocardial infarction) (HCC) 05/11/2015   Orthostatic hypotension    OSTEOARTHRITIS, HAND    Pacemaker 01/21/2012   MDT Adapta dual chamber   Paroxysmal atrial  flutter (HCC)    Persistent atrial fibrillation (HCC)    PVD (peripheral vascular disease), hx stents to bil SFAs 02/2010    Rash, skin     superficial raised red pencil point sized rash bilateral forearms; states " it started when i started the Plavix "    Ruptured left breast implant    Symptomatic sinus bradycardia 01/22/2012   Syncope    a. 11/2015 ? due to medications.   Syncope    reports on 05-10-17 " i passed out 2 months ago in the bathroom and broke some ribs"     Tachy-brady syndrome (HCC)    a. s/p MDT PPM 2013.   Tricuspid regurgitation    Current Outpatient Medications  Medication Sig Dispense Refill   amoxicillin-clavulanate (AUGMENTIN) 875-125 MG tablet Take 1 tablet by mouth 2 (two) times daily for 7 days. 14 tablet 0   amLODipine (NORVASC) 5 MG tablet TAKE 1 TABLET (5 MG TOTAL) BY MOUTH IN THE MORNING AND AT BEDTIME 180 tablet 0   ascorbic acid (VITAMIN C) 250 MG CHEW Chew 750 mg by mouth daily.     aspirin EC 81 MG tablet Take 81 mg by mouth daily.      Biotin 19147 MCG TABS Take 10,000 mcg by mouth daily.     carvedilol (COREG) 6.25 MG tablet Take 1 tablet (6.25 mg total) by mouth 2 (two) times daily with a meal. 180 tablet 0   Cholecalciferol (VITAMIN D) 50 MCG (2000 UT) tablet Take 2,000 Units by mouth daily.     cilostazol (PLETAL) 50 MG tablet Take 1 tablet (50 mg total) by mouth 2 (two) times daily. 180 tablet 3   citalopram (CELEXA) 40 MG tablet TAKE 1 TABLET BY MOUTH EVERY DAY 90 tablet 1   Cyanocobalamin (B-12) 5000 MCG CAPS Take 10,000 mcg by mouth daily.     diclofenac (VOLTAREN) 75 MG EC tablet Take 1 tablet (75 mg total) by mouth 2 (two) times daily. 30 tablet 0   dofetilide (TIKOSYN) 125 MCG capsule TAKE ONE CAPSULE BY MOUTH EVERY TWELVE HOURS. Please call [phone] to schedule an overdue appointment with Dr. Thurmon Fair for future refills. Thank you. 1st attempt 60 capsule 0   Evolocumab (REPATHA SURECLICK) 140 MG/ML SOAJ Inject 140 mg into the skin  every 14 (fourteen) days. 6 mL 3   ezetimibe (ZETIA) 10 MG tablet Take 1 tablet (10 mg total) by mouth daily. 90 tablet 0   loteprednol (LOTEMAX) 0.5 % ophthalmic suspension Place 2 drops into both eyes See admin instructions. Instill 2 drops into both eyes 2 times a day twice weekly     Magnesium 200 MG TABS Take 200 mg by mouth daily.     Multiple Vitamin (MULTIVITAMIN WITH MINERALS) TABS tablet Take 1 tablet by mouth 2 (two) times daily.     Multiple Vitamins-Minerals (ZINC PO) Take 150 mg by mouth daily.     nitroGLYCERIN (NITROSTAT) 0.4 MG SL tablet Place 1 tablet (0.4 mg total) under the tongue every 5 (five) minutes x 3 doses as needed for chest pain. 25 tablet 2   oseltamivir (TAMIFLU) 30 MG capsule Take 1 capsule (30 mg total) by mouth 2 (two) times daily. 10  capsule 0   rosuvastatin (CRESTOR) 10 MG tablet Take 1 tablet (10 mg total) by mouth daily. 90 tablet 0   VITAMIN D-VITAMIN K PO Take 1 capsule by mouth daily.     No current facility-administered medications for this visit.   Allergies  Allergen Reactions   Brilinta [Ticagrelor] Shortness Of Breath   Atorvastatin Other (See Comments)    Possible cause of fatigue/malaise   Clonidine Derivatives Other (See Comments)    Lowers heart rate   Crestor [Rosuvastatin Calcium] Other (See Comments)    Joint/muscle aches   Exforge [Amlodipine Besylate-Valsartan] Itching and Rash   Spironolactone Other (See Comments)    Contraindicated with history of hyperkalemia   Social History   Socioeconomic History   Marital status: Widowed    Spouse name: Not on file   Number of children: 3   Years of education: Not on file   Highest education level: Not on file  Occupational History   Occupation: Retired Advertising account executive: RETIRED  Tobacco Use   Smoking status: Former    Current packs/day: 0.00    Average packs/day: 0.8 packs/day for 10.0 years (7.5 ttl pk-yrs)    Types: Cigarettes    Start date: 52    Quit date: 2008     Years since quitting: 17.1   Smokeless tobacco: Never   Tobacco comments:    01/21/2012 "quit smoking ~ 2002"  Vaping Use   Vaping status: Never Used  Substance and Sexual Activity   Alcohol use: Yes    Comment: daily coctail   Drug use: No   Sexual activity: Not Currently  Other Topics Concern   Not on file  Social History Narrative   Lives in [place].  Retired.   Social Drivers of Corporate investment banker Strain: Low Risk  (02/11/2022)   Overall Financial Resource Strain (CARDIA)    Difficulty of Paying Living Expenses: Not very hard  Food Insecurity: No Food Insecurity (01/28/2022)   Hunger Vital Sign    Worried About Running Out of Food in the Last Year: Never true    Ran Out of Food in the Last Year: Never true  Transportation Needs: No Transportation Needs (01/28/2022)   PRAPARE - Administrator, Civil Service (Medical): No    Lack of Transportation (Non-Medical): No  Physical Activity: Sufficiently Active (03/25/2022)   Exercise Vital Sign    Days of Exercise per Week: 3 days    Minutes of Exercise per Session: 60 min  Stress: No Stress Concern Present (03/02/2022)   [employer] of Occupational Health - Occupational Stress Questionnaire    Feeling of Stress : Not at all  Social Connections: Moderately Integrated (04/08/2022)   Social Connection and Isolation Panel [NHANES]    Frequency of Communication with Friends and Family: More than three times a week    Frequency of Social Gatherings with Friends and Family: More than three times a week    Attends Religious Services: More than 4 times per year    Active Member of [employer] or Organizations: Yes    Attends Banker Meetings: More than 4 times per year    Marital Status: Widowed  Recent Concern: Social Connections - Moderately Isolated (04/08/2022)   Social Connection and Isolation Panel [NHANES]    Frequency of Communication with Friends and Family: Once a week    Frequency of  Social Gatherings with Friends and Family: Once a week    Attends Religious Services:  More than 4 times per year    Active Member of Clubs or Organizations: Yes    Attends Banker Meetings: More than 4 times per year    Marital Status: Widowed  Intimate Partner Violence: Unknown (05/16/2021)   Received from [facility]   HITS    Physically Hurt: Not on file    Insult or Talk Down To: Not on file    Threaten Physical Harm: Not on file    Scream or Curse: Not on file   Review of Systems  Constitutional: Negative.  Negative for chills and fever.  HENT:  Positive for congestion and sinus pain. Negative for sore throat.   Respiratory: Negative.  Negative for cough and shortness of breath.   Cardiovascular: Negative.  Negative for chest pain and palpitations.  Gastrointestinal:  Negative for abdominal pain, diarrhea, nausea and vomiting.  Genitourinary: Negative.  Negative for dysuria and hematuria.  Skin: Negative.  Negative for rash.  Neurological:  Positive for headaches.  All other systems reviewed and are negative.  Objective  Alert and oriented x 3 in no apparent respiratory distress. Vitals as reported by the patient: There were no vitals filed for this visit. Problem List Items Addressed This Visit       Respiratory   Acute non-recurrent maxillary sinusitis - Primary   Recently diagnosed with influenza virus and started on Tamiflu Has secondary bacterial sinus infection Recommend to start Augmentin 875 mg twice a day for 7 days Symptom management discussed Has sinus headaches and congestion Pain management discussed Advised to rest and stay well-hydrated ED precautions given Advised to contact the office if no better or worse during the next several days.      Relevant Medications   amoxicillin-clavulanate (AUGMENTIN) 875-125 MG tablet     Other   Flu-like symptoms   Mostly headache and congestion Symptom management discussed Pain  management discussed May take Tylenol and or Advil as needed Advised to rest and stay well-hydrated Advised to contact the office if no better or worse during the next several days        I discussed the assessment and treatment plan with the patient. The patient was provided an opportunity to ask questions and all were answered. The patient agreed with the plan and demonstrated an understanding of the instructions.   The patient was advised to call back or seek an in-person evaluation if the symptoms worsen or if the condition fails to improve as anticipated.  I provided 30 minutes of non-face-to-face time during this encounter including time preparing for this visit, review of most recent office visit notes, review of multiple chronic medical conditions under management, review of all medications, diagnosis of sinus infection and need to start antibiotics, prognosis, symptom management, documentation, and need for follow-up if no better or worse during the next several days  Dr. Edwina Barth, MD [facility] at [facility]

## 2023-04-07 NOTE — Assessment & Plan Note (Signed)
 Recently diagnosed with influenza virus and started on Tamiflu Has secondary bacterial sinus infection Recommend to start Augmentin 875 mg twice a day for 7 days Symptom management discussed Has sinus headaches and congestion Pain management discussed Advised to rest and stay well-hydrated ED precautions given Advised to contact the office if no better or worse during the next several days.

## 2023-04-09 ENCOUNTER — Telehealth: Payer: Self-pay | Admitting: Emergency Medicine

## 2023-04-09 NOTE — Telephone Encounter (Signed)
 Copied from CRM [id number]. Topic: Clinical - Medication Question >> Apr 09, 2023  2:19 PM Isabelle Course C wrote: Reason for CRM: Patient stated that she has been taking amoxicillin-clavulanate (AUGMENTIN) 875-125 MG tablet and it is not working. Patient left eye to the top of her head is eyes. Patient is requesting another antibiotic and possibly something for pain. Patient has also been taking Mucinex.

## 2023-04-12 NOTE — Telephone Encounter (Signed)
Recommend office visit.  Thanks.

## 2023-04-12 NOTE — Telephone Encounter (Signed)
LVM for patient to call back to schedule appt

## 2023-04-15 DIAGNOSIS — H5712 Ocular pain, left eye: Secondary | ICD-10-CM | POA: Diagnosis not present

## 2023-04-19 ENCOUNTER — Encounter: Payer: Self-pay | Admitting: Emergency Medicine

## 2023-04-19 ENCOUNTER — Ambulatory Visit (INDEPENDENT_AMBULATORY_CARE_PROVIDER_SITE_OTHER): Admitting: Emergency Medicine

## 2023-04-19 VITALS — BP 118/80 | HR 91 | Temp 97.8°F | Ht 63.0 in | Wt 145.4 lb

## 2023-04-19 DIAGNOSIS — R519 Headache, unspecified: Secondary | ICD-10-CM | POA: Diagnosis not present

## 2023-04-19 DIAGNOSIS — R0981 Nasal congestion: Secondary | ICD-10-CM | POA: Insufficient documentation

## 2023-04-19 DIAGNOSIS — J0100 Acute maxillary sinusitis, unspecified: Secondary | ICD-10-CM | POA: Diagnosis not present

## 2023-04-19 MED ORDER — PREDNISONE 20 MG PO TABS: 20.0000 mg | ORAL_TABLET | Freq: Every day | ORAL | 0 refills | Status: AC

## 2023-04-19 MED ORDER — DOXYCYCLINE HYCLATE 100 MG PO TABS: 100.0000 mg | ORAL_TABLET | Freq: Two times a day (BID) | ORAL | 0 refills | Status: AC

## 2023-04-19 NOTE — Patient Instructions (Signed)

## 2023-04-19 NOTE — Assessment & Plan Note (Signed)
 Recently diagnosed with influenza virus and started on Tamiflu Has secondary bacterial sinus infection Finished Augmentin 875 mg twice a day for 7 days Sinus infection still present.  Recommend to start doxycycline 100 mg twice a day for 10 days Symptom management discussed Has sinus headaches and congestion Pain management discussed Advised to rest and stay well-hydrated ED precautions given Advised to contact the office if no better or worse during the next several days.

## 2023-04-19 NOTE — Assessment & Plan Note (Signed)
 Pain management discussed Advised to take Tylenol and or Advil as needed for headaches

## 2023-04-19 NOTE — Assessment & Plan Note (Signed)
 Advised to continue using saline nasal spray several times a day Will benefit from prednisone 20 mg daily for 5 days

## 2023-04-19 NOTE — Progress Notes (Signed)
 [name] 83 y.o.   Chief Complaint  Patient presents with   Sinus Problem    Sinus issue for about 3-4 weeks, headache, cough     HISTORY OF PRESENT ILLNESS: Acute problem visit today. This is a 83 y.o. female complaining of persistent sinus problem for the past 2 to 3 weeks Video visit with me on 04/07/2023 with similar complaints.  Was started on Augmentin. No significant improvement.  Still complaining of sinus congestion and headache. Had influenza infection weeks ago.   Sinus Problem Associated symptoms include congestion and headaches. Pertinent negatives include no chills, coughing or shortness of breath.     Prior to Admission medications   Medication Sig Start Date End Date Taking? Authorizing Provider  amLODipine (NORVASC) 5 MG tablet TAKE 1 TABLET (5 MG TOTAL) BY MOUTH IN THE MORNING AND AT BEDTIME 04/06/23  Yes Croitoru, Mihai, MD  ascorbic acid (VITAMIN C) 250 MG CHEW Chew 750 mg by mouth daily.   Yes [provider]  aspirin EC 81 MG tablet Take 81 mg by mouth daily.    Yes [provider]  Biotin 82956 MCG TABS Take 10,000 mcg by mouth daily.   Yes [provider]  carvedilol (COREG) 6.25 MG tablet Take 1 tablet (6.25 mg total) by mouth 2 (two) times daily with a meal. 04/06/23  Yes Croitoru, Mihai, MD  Cholecalciferol (VITAMIN D) 50 MCG (2000 UT) tablet Take 2,000 Units by mouth daily.   Yes [provider]  cilostazol (PLETAL) 50 MG tablet Take 1 tablet (50 mg total) by mouth 2 (two) times daily. 06/10/22  Yes Runell Gess, MD  citalopram (CELEXA) 40 MG tablet TAKE 1 TABLET BY MOUTH EVERY DAY 04/01/23  Yes Lashan Macias, Eilleen Kempf, MD  Cyanocobalamin (B-12) 5000 MCG CAPS Take 10,000 mcg by mouth daily.   Yes [provider]  diclofenac (VOLTAREN) 75 MG EC tablet Take 1 tablet (75 mg total) by mouth 2 (two) times daily. 04/06/22  Yes Kennya Schwenn, Eilleen Kempf, MD  dofetilide (TIKOSYN) 125 MCG capsule TAKE ONE CAPSULE BY MOUTH  EVERY TWELVE HOURS. Please call [phone] to schedule an overdue appointment with Dr. Thurmon Fair for future refills. Thank you. 1st attempt 04/06/23  Yes Croitoru, Mihai, MD  Evolocumab (REPATHA SURECLICK) 140 MG/ML SOAJ Inject 140 mg into the skin every 14 (fourteen) days. 02/13/22  Yes Croitoru, Mihai, MD  ezetimibe (ZETIA) 10 MG tablet Take 1 tablet (10 mg total) by mouth daily. 04/06/23 07/05/23 Yes Croitoru, Mihai, MD  loteprednol (LOTEMAX) 0.5 % ophthalmic suspension Place 2 drops into both eyes See admin instructions. Instill 2 drops into both eyes 2 times a day twice weekly   Yes [provider]  Magnesium 200 MG TABS Take 200 mg by mouth daily.   Yes [provider]  Multiple Vitamin (MULTIVITAMIN WITH MINERALS) TABS tablet Take 1 tablet by mouth 2 (two) times daily.   Yes [provider]  Multiple Vitamins-Minerals (ZINC PO) Take 150 mg by mouth daily.   Yes [provider]  nitroGLYCERIN (NITROSTAT) 0.4 MG SL tablet Place 1 tablet (0.4 mg total) under the tongue every 5 (five) minutes x 3 doses as needed for chest pain. 05/15/15  Yes Robbie Lis M, PA-C  oseltamivir (TAMIFLU) 30 MG capsule Take 1 capsule (30 mg total) by mouth 2 (two) times daily. 03/26/23  Yes Laurence Spates, MD  rosuvastatin (CRESTOR) 10 MG tablet Take 1 tablet (10 mg total) by mouth daily. 04/06/23  Yes Croitoru,  Mihai, MD  VITAMIN D-VITAMIN K PO Take 1 capsule by mouth daily.   Yes [provider]    Allergies  Allergen Reactions   Brilinta [Ticagrelor] Shortness Of Breath   Atorvastatin Other (See Comments)    Possible cause of fatigue/malaise   Clonidine Derivatives Other (See Comments)    Lowers heart rate   Crestor [Rosuvastatin Calcium] Other (See Comments)    Joint/muscle aches   Exforge [Amlodipine Besylate-Valsartan] Itching and Rash   Spironolactone Other (See Comments)    Contraindicated with history of hyperkalemia    Patient Active Problem List    Diagnosis Date Noted   Acute non-recurrent maxillary sinusitis 04/07/2023   Flu-like symptoms 04/07/2023   Lower respiratory infection 03/17/2022   Moderate episode of recurrent major depressive disorder (HCC) 10/07/2021   Lack of energy 10/07/2021   Compression fracture of T12 vertebra (HCC) 05/28/2021   Chronic neck pain 04/07/2021   Chronic bilateral low back pain without sciatica 04/07/2021   Exertional angina (HCC)    Renal artery stenosis (HCC) 06/26/2020   Cervical spine disease 05/30/2020   Statin intolerance 11/22/2019   Aortic atherosclerosis (HCC) 11/22/2019   Hypothyroidism (acquired) 02/08/2018   Coronary artery disease involving coronary bypass graft of native heart without angina pectoris 10/22/2017   Dyslipidemia 10/22/2017   Cerebrovascular small vessel disease 04/27/2017   Cerebrovascular disease 03/25/2017   Claudication in peripheral vascular disease (HCC) 02/15/2017   Hyperparathyroidism (HCC) 05/13/2016   Cardiac pacemaker in situ 01/10/2016   Benign neoplasm of ascending colon    Long term current use of anticoagulant    CKD (chronic kidney disease), stage II    Chronic diastolic heart failure (HCC) 10/19/2015   Atypical atrial flutter (HCC)    Paroxysmal atrial flutter (HCC) 08/23/2015   Chronic fatigue 06/27/2015   Stented coronary artery    S/P CABG x 3 01/04/15 01/10/2015   CAD (coronary artery disease) 01/04/2015   PAF (paroxysmal atrial fibrillation) (HCC) 01/22/2012   Sinus node dysfunction (HCC) 01/22/2012   S/P placement of cardiac pacemaker, medtronic adapta 01/21/12 01/22/2012   PVD (peripheral vascular disease), hx stents to bil SFAs 02/2010 01/22/2012   Hyperlipidemia 12/31/2009   Anxiety 12/31/2009   DEPRESSION 12/31/2009   Essential hypertension 12/31/2009   Coronary atherosclerosis 12/31/2009   OSTEOARTHRITIS, HAND 12/31/2009   DISC DISEASE, CERVICAL 12/31/2009   DISC DISEASE, LUMBAR 12/31/2009    Past Medical History:  Diagnosis  Date   ANXIETY 12/31/2009   Aortic insufficiency    a. mod by echo 2017.   CAD (coronary artery disease)    a. s/p CABGx3 and LAA clipping in 12/2014, NSTEMI 05/2015 s/p DES to native RCA and SVG-dRCA; occluded ramus-SVG was treated medically). c. neg [facility]uc 10/2015 at [facility].   Chronic diastolic CHF (congestive heart failure) (HCC)    Chronic fatigue    CKD (chronic kidney disease), stage II    DEPRESSION 12/31/2009   DISC DISEASE, CERVICAL 12/31/2009   DISC DISEASE, LUMBAR 12/31/2009   DIVERTICULITIS, HX OF 12/31/2009   Essential hypertension    Gait abnormality 03/25/2017   GLUCOSE INTOLERANCE 12/31/2009   Habitual alcohol use    History of stroke    self reports " they say i have had some pin strokes"    Hypercalcemia    Hyperkalemia    Hyperlipidemia 12/31/2009   Hypothyroidism    MENOPAUSE, EARLY 12/31/2009   NSTEMI (non-ST elevated myocardial infarction) (HCC) 05/11/2015   Orthostatic hypotension    OSTEOARTHRITIS, HAND    Pacemaker  01/21/2012   MDT Adapta dual chamber   Paroxysmal atrial flutter (HCC)    Persistent atrial fibrillation (HCC)    PVD (peripheral vascular disease), hx stents to bil SFAs 02/2010    Rash, skin     superficial raised red pencil point sized rash bilateral forearms; states " it started when i started the Plavix "    Ruptured left breast implant    Symptomatic sinus bradycardia 01/22/2012   Syncope    a. 11/2015 ? due to medications.   Syncope    reports on 05-10-17 " i passed out 2 months ago in the bathroom and broke some ribs"     Tachy-brady syndrome (HCC)    a. s/p MDT PPM 2013.   Tricuspid regurgitation     Past Surgical History:  Procedure Laterality Date   ABDOMINAL AORTOGRAM W/LOWER EXTREMITY Bilateral 02/15/2017   Procedure: ABDOMINAL AORTOGRAM W/LOWER EXTREMITY;  Surgeon: Runell Gess, MD;  Location: [facility];  Service: Cardiovascular;  Laterality: Bilateral;  bilat   ABDOMINAL AORTOGRAM W/LOWER EXTREMITY N/A 10/06/2018    Procedure: ABDOMINAL AORTOGRAM W/LOWER EXTREMITY;  Surgeon: Runell Gess, MD;  Location: [facility];  Service: Cardiovascular;  Laterality: N/A;   Ablation of typical atrial flutter (CTI line)  09/05/2016   Dr Beatrix Fetters at [facility]    AUGMENTATION MAMMAPLASTY  1983   Removed 01/2018   BREAST IMPLANT REMOVAL Bilateral 01/14/2018   Procedure: REMOVAL BILATERAL BREAST IMPLANTS;  Surgeon: Glenna Fellows, MD;  Location: [facility];  Service: Plastics;  Laterality: Bilateral;   CAPSULECTOMY Bilateral 01/14/2018   Procedure: CAPSULECTOMY;  Surgeon: Glenna Fellows, MD;  Location: [facility];  Service: Plastics;  Laterality: Bilateral;   CARDIAC CATHETERIZATION N/A 01/01/2015   Procedure: Left Heart Cath and Coronary Angiography;  Surgeon: Corky Crafts, MD;  Location[facility];  Service: Cardiovascular;  Laterality: N/A;   CARDIAC CATHETERIZATION N/A 05/12/2015   Procedure: Left Heart Cath and Coronary Angiography;  Surgeon: Lennette Bihari, MD;  Location: [facility];  Service: Cardiovascular;  Laterality: N/A;   CARDIAC CATHETERIZATION N/A 05/12/2015   Procedure: Coronary Stent Intervention;  Surgeon: Lennette Bihari, MD;  Location: [facility];  Service: Cardiovascular;  Laterality: N/A;   CARDIOVERSION N/A 02/01/2015   Procedure: CARDIOVERSION;  Surgeon: Lewayne Bunting, MD;  Location: [facility]COPY;  Service: Cardiovascular;  Laterality: N/A;   CARDIOVERSION N/A 08/30/2015   Procedure: CARDIOVERSION;  Surgeon: Thurmon Fair, MD;  Location: [facility];  Service: Cardiovascular;  Laterality: N/A;   CARPAL TUNNEL RELEASE  2008   "right hand/thumb; carpal tunnel repair; got rid of arthritis" (01/21/2012)   CLIPPING OF ATRIAL APPENDAGE N/A 01/04/2015   Procedure: CLIPPING OF ATRIAL APPENDAGE;  Surgeon: Delight Ovens, MD;  Location: [facility];  Service: Open Heart Surgery;  Laterality: N/A;   COLONOSCOPY N/A 12/21/2015    Procedure: COLONOSCOPY;  Surgeon: Rachael Fee, MD;  Location: [facility];  Service: Endoscopy;  Laterality: N/A;   CORONARY ARTERY BYPASS GRAFT N/A 01/04/2015   Procedure: CORONARY ARTERY BYPASS GRAFTING (CABG) x 3 using left internal mammory artery and greater saphenous vein right leg harvested endoscopically.;  Surgeon: Delight Ovens, MD;  LIMA-LAD, SVG-RI, SVG-PDA   CORONARY STENT INTERVENTION N/A 11/07/2020   Procedure: CORONARY STENT INTERVENTION;  Surgeon: Orbie Pyo, MD;  Location: [facility];  Service: Cardiovascular;  Laterality: N/A;   ESOPHAGOGASTRODUODENOSCOPY (EGD) WITH PROPOFOL N/A 12/18/2015  Procedure: ESOPHAGOGASTRODUODENOSCOPY (EGD) WITH PROPOFOL;  Surgeon: Rachael Fee, MD;  Location: [facility];  Service: Endoscopy;  Laterality: N/A;   FACELIFT, LOWER 2/3  1995   "mini" (01/21/2012)   GIVENS CAPSULE STUDY N/A 12/21/2015   Procedure: GIVENS CAPSULE STUDY;  Surgeon: Rachael Fee, MD;  Location: [facility];  Service: Endoscopy;  Laterality: N/A;   LEFT HEART CATH AND CORS/GRAFTS ANGIOGRAPHY N/A 11/07/2020   Procedure: LEFT HEART CATH AND CORS/GRAFTS ANGIOGRAPHY;  Surgeon: Orbie Pyo, MD;  Location: [facility];  Service: Cardiovascular;  Laterality: N/A;   Lower Arterial Examination  10/28/2011   R. SFA stent mild-moderate mixed density plaque with elevated velocities consistent with 50% diameter reduction. L. SFA stent moderate mixed denisty plaque at mid to distal level consistent with 50-69% diameter reduction.   LOWER EXTREMITY ANGIOGRAPHY N/A 02/15/2017   Procedure: LOWER EXTREMITY ANGIOGRAPHY;  Surgeon: Runell Gess, MD;  [facility]: [facility];  Service: Cardiovascular;  Laterality: N/A;   OOPHORECTOMY  ~1979   PARATHYROIDECTOMY N/A 05/13/2017   Procedure: PARATHYROIDECTOMY;  Surgeon: Darnell Level, MD;  Location: [facility];  Service: General;  Laterality: N/A;   PARTIAL COLECTOMY  2010   PERIPHERAL ARTERIAL STENT GRAFT   2012; 2012   "LLE; RLE" (01/21/2012)   PERIPHERAL VASCULAR BALLOON ANGIOPLASTY Right 02/15/2017   Procedure: PERIPHERAL VASCULAR BALLOON ANGIOPLASTY;  Surgeon: Runell Gess, MD;  Location: [facility];  Service: Cardiovascular;  Laterality: Right;  SFA     PERIPHERAL VASCULAR INTERVENTION Right 10/06/2018   Procedure: PERIPHERAL VASCULAR INTERVENTION;  Surgeon: Runell Gess, MD;  Location: [facility];  Service: Cardiovascular;  Laterality: Right;   PERMANENT PACEMAKER INSERTION N/A 01/21/2012   Medtronic Adapta L implanted by Dr Royann Shivers for tachy/brady syndrome   POSTERIOR CERVICAL LAMINECTOMY  1985   TEE WITHOUT CARDIOVERSION N/A 01/04/2015   Procedure: TRANSESOPHAGEAL ECHOCARDIOGRAM (TEE);  Surgeon: Delight Ovens, MD;  Location: [facility] OR;  Service: Open Heart Surgery;  Laterality: N/A;   TEE WITHOUT CARDIOVERSION N/A 02/01/2015   Procedure: TRANSESOPHAGEAL ECHOCARDIOGRAM (TEE);  Surgeon: Lewayne Bunting, MD;  Location: [facility] ENDOSCOPY;  Service: Cardiovascular;  Laterality: N/A;   TEE WITHOUT CARDIOVERSION N/A 08/30/2015   Procedure: TRANSESOPHAGEAL ECHOCARDIOGRAM (TEE);  Surgeon: Thurmon Fair, MD;  Location: [facility] ENDOSCOPY;  Service: Cardiovascular;  Laterality: N/A;   VAGINAL HYSTERECTOMY  1975    Social History   Socioeconomic History   Marital status: Widowed    Spouse name: Not on file   Number of children: 3   Years of education: Not on file   Highest education level: Not on file  Occupational History   Occupation: Retired Advertising account executive: RETIRED  Tobacco Use   Smoking status: Former    Current packs/day: 0.00    Average packs/day: 0.8 packs/day for 10.0 years (7.5 ttl pk-yrs)    Types: Cigarettes    Start date: 55    Quit date: 2008    Years since quitting: 17.2   Smokeless tobacco: Never   Tobacco comments:    01/21/2012 "quit smoking ~ 2002"  Vaping Use   Vaping status: Never Used  Substance and Sexual Activity   Alcohol  use: Yes    Comment: daily coctail   Drug use: No   Sexual activity: Not Currently  Other Topics Concern   Not on file  Social History Narrative   Lives in [place].  Retired.   Social Drivers of Dispensing optician  Resource Strain: Low Risk  (02/11/2022)   Overall Financial Resource Strain (CARDIA)    Difficulty of Paying Living Expenses: Not very hard  Food Insecurity: No Food Insecurity (01/28/2022)   Hunger Vital Sign    Worried About Running Out of Food in the Last Year: Never true    Ran Out of Food in the Last Year: Never true  Transportation Needs: No Transportation Needs (01/28/2022)   PRAPARE - Administrator, Civil Service (Medical): No    Lack of Transportation (Non-Medical): No  Physical Activity: Sufficiently Active (03/25/2022)   Exercise Vital Sign    Days of Exercise per Week: 3 days    Minutes of Exercise per Session: 60 min  Stress: No Stress Concern Present (03/02/2022)   [employer] of Occupational Health - Occupational Stress Questionnaire    Feeling of Stress : Not at all  Social Connections: Moderately Integrated (04/08/2022)   Social Connection and Isolation Panel [NHANES]    Frequency of Communication with Friends and Family: More than three times a week    Frequency of Social Gatherings with Friends and Family: More than three times a week    Attends Religious Services: More than 4 times per year    Active Member of [employer] or Organizations: Yes    Attends Banker Meetings: More than 4 times per year    Marital Status: Widowed  Recent Concern: Social Connections - Moderately Isolated (04/08/2022)   Social Connection and Isolation Panel [NHANES]    Frequency of Communication with Friends and Family: Once a week    Frequency of Social Gatherings with Friends and Family: Once a week    Attends Religious Services: More than 4 times per year    Active Member of [employer] or Organizations: Yes    Attends Banker Meetings:  More than 4 times per year    Marital Status: Widowed  Intimate Partner Violence: Unknown (05/16/2021)   Received from [facility]   HITS    Physically Hurt: Not on file    Insult or Talk Down To: Not on file    Threaten Physical Harm: Not on file    Scream or Curse: Not on file    Family History  Problem Relation Age of Onset   Heart disease Mother    Hypertension Mother    Stroke Mother    Stroke Father    Heart disease Father    Heart disease Brother    Hypertension Brother        2 brothers   CVA Brother        2 brothers   Heart attack Brother        2 brothers     Review of Systems  Constitutional: Negative.  Negative for chills and fever.  HENT:  Positive for congestion and sinus pain.   Respiratory: Negative.  Negative for cough and shortness of breath.   Cardiovascular: Negative.  Negative for chest pain and palpitations.  Gastrointestinal:  Negative for abdominal pain, diarrhea, nausea and vomiting.  Genitourinary: Negative.  Negative for dysuria and hematuria.  Neurological:  Positive for headaches.    Vitals:   04/19/23 1407  BP: 118/80  Pulse: 91  Temp: 97.8 F (36.6 C)  SpO2: 96%    Physical Exam Vitals reviewed.  Constitutional:      Appearance: Normal appearance.  HENT:     Head: Normocephalic.     Right Ear: Tympanic membrane, ear canal and external ear  normal.     Left Ear: Tympanic membrane, ear canal and external ear normal.     Nose: Congestion present.     Mouth/Throat:     Mouth: Mucous membranes are moist.     Pharynx: Oropharynx is clear.  Eyes:     Extraocular Movements: Extraocular movements intact.     Conjunctiva/sclera: Conjunctivae normal.     Pupils: Pupils are equal, round, and reactive to light.  Cardiovascular:     Rate and Rhythm: Normal rate and regular rhythm.     Pulses: Normal pulses.     Heart sounds: Normal heart sounds.  Pulmonary:     Effort: Pulmonary effort is normal.     Breath sounds:  Normal breath sounds.  Musculoskeletal:     Cervical back: No tenderness.  Lymphadenopathy:     Cervical: No cervical adenopathy.  Skin:    General: Skin is warm and dry.     Capillary Refill: Capillary refill takes less than 2 seconds.  Neurological:     General: No focal deficit present.     Mental Status: She is alert and oriented to person, place, and time.  Psychiatric:        Mood and Affect: Mood normal.        Behavior: Behavior normal.      ASSESSMENT & PLAN: A total of 34 minutes was spent with the patient and counseling/coordination of care regarding preparing for this visit, review of most recent office visit notes, review of multiple chronic medical conditions and their management, review of all medications, diagnosis of acute sinus infection and need for antibiotics, review of most recent bloodwork results, review of health maintenance items, education on nutrition, prognosis, documentation, and need for follow up.   Problem List Items Addressed This Visit       Respiratory   Acute non-recurrent maxillary sinusitis - Primary   Recently diagnosed with influenza virus and started on Tamiflu Has secondary bacterial sinus infection Finished Augmentin 875 mg twice a day for 7 days Sinus infection still present.  Recommend to start doxycycline 100 mg twice a day for 10 days Symptom management discussed Has sinus headaches and congestion Pain management discussed Advised to rest and stay well-hydrated ED precautions given Advised to contact the office if no better or worse during the next several days.      Relevant Medications   doxycycline (VIBRA-TABS) 100 MG tablet   predniSONE (DELTASONE) 20 MG tablet   Sinus congestion   Advised to continue using saline nasal spray several times a day Will benefit from prednisone 20 mg daily for 5 days      Relevant Medications   predniSONE (DELTASONE) 20 MG tablet     Other   Sinus headache   Pain management  discussed Advised to take Tylenol and or Advil as needed for headaches      Patient Instructions  Sinus Infection, Adult A sinus infection is soreness and swelling (inflammation) of your sinuses. Sinuses are hollow spaces in the bones around your face. They are located: Around your eyes. In the middle of your forehead. Behind your nose. In your cheekbones. Your sinuses and nasal passages are lined with a fluid called mucus. Mucus drains out of your sinuses. Swelling can trap mucus in your sinuses. This lets germs (bacteria, virus, or fungus) grow, which leads to infection. Most of the time, this condition is caused by a virus. What are the causes? Allergies. Asthma. Germs. Things that block your nose or sinuses. Growths  in the nose (nasal polyps). Chemicals or irritants in the air. A fungus. This is rare. What increases the risk? Having a weak body defense system (immune system). Doing a lot of swimming or diving. Using nasal sprays too much. Smoking. What are the signs or symptoms? The main symptoms of this condition are pain and a feeling of pressure around the sinuses. Other symptoms include: Stuffy nose (congestion). This may make it hard to breathe through your nose. Runny nose (drainage). Soreness, swelling, and warmth in the sinuses. A cough that may get worse at night. Being unable to smell and taste. Mucus that collects in the throat or the back of the nose (postnasal drip). This may cause a sore throat or bad breath. Being very tired (fatigued). A fever. How is this diagnosed? Your symptoms. Your medical history. A physical exam. Tests to find out if your condition is short-term (acute) or long-term (chronic). Your doctor may: Check your nose for growths (polyps). Check your sinuses using a tool that has a light on one end (endoscope). Check for allergies or germs. Do imaging tests, such as an MRI or CT scan. How is this treated? Treatment for this condition  depends on the cause and whether it is short-term or long-term. If caused by a virus, your symptoms should go away on their own within 10 days. You may be given medicines to relieve symptoms. They include: Medicines that shrink swollen tissue in the nose. A spray that treats swelling of the nostrils. Rinses that help get rid of thick mucus in your nose (nasal saline washes). Medicines that treat allergies (antihistamines). Over-the-counter pain relievers. If caused by bacteria, your doctor may wait to see if you will get better without treatment. You may be given antibiotic medicine if you have: A very bad infection. A weak body defense system. If caused by growths in the nose, surgery may be needed. Follow these instructions at home: Medicines Take, use, or apply over-the-counter and prescription medicines only as told by your doctor. These may include nasal sprays. If you were prescribed an antibiotic medicine, take it as told by your doctor. Do not stop taking it even if you start to feel better. Hydrate and humidify  Drink enough water to keep your pee (urine) pale yellow. Use a cool mist humidifier to keep the humidity level in your home above 50%. Breathe in steam for 10-15 minutes, 3-4 times a day, or as told by your doctor. You can do this in the bathroom while a hot shower is running. Try not to spend time in cool or dry air. Rest Rest as much as you can. Sleep with your head raised (elevated). Make sure you get enough sleep each night. General instructions  Put a warm, moist washcloth on your face 3-4 times a day, or as often as told by your doctor. Use nasal saline washes as often as told by your doctor. Wash your hands often with soap and water. If you cannot use soap and water, use hand sanitizer. Do not smoke. Avoid being around people who are smoking (secondhand smoke). Keep all follow-up visits. Contact a doctor if: You have a fever. Your symptoms get worse. Your  symptoms do not get better within 10 days. Get help right away if: You have a very bad headache. You cannot stop vomiting. You have very bad pain or swelling around your face or eyes. You have trouble seeing. You feel confused. Your neck is stiff. You have trouble breathing. These symptoms may  be an emergency. Get help right away. Call 911. Do not wait to see if the symptoms will go away. Do not drive yourself to the hospital. Summary A sinus infection is swelling of your sinuses. Sinuses are hollow spaces in the bones around your face. This condition is caused by tissues in your nose that become inflamed or swollen. This traps germs. These can lead to infection. If you were prescribed an antibiotic medicine, take it as told by your doctor. Do not stop taking it even if you start to feel better. Keep all follow-up visits. This information is not intended to replace advice given to you by your health care provider. Make sure you discuss any questions you have with your health care provider. Document Revised: 12/31/2020 Document Reviewed: 12/31/2020 Elsevier Patient Education  2024 [employer]    Edwina Barth, MD [facility]

## 2023-04-22 ENCOUNTER — Ambulatory Visit: Payer: Medicare Other | Attending: Cardiovascular Disease | Admitting: Cardiovascular Disease

## 2023-04-22 ENCOUNTER — Encounter: Payer: Self-pay | Admitting: Cardiovascular Disease

## 2023-04-22 VITALS — BP 128/72 | HR 92 | Ht 63.0 in | Wt 141.8 lb

## 2023-04-22 DIAGNOSIS — I495 Sick sinus syndrome: Secondary | ICD-10-CM

## 2023-04-22 DIAGNOSIS — I701 Atherosclerosis of renal artery: Secondary | ICD-10-CM

## 2023-04-22 DIAGNOSIS — I480 Paroxysmal atrial fibrillation: Secondary | ICD-10-CM

## 2023-04-22 DIAGNOSIS — I679 Cerebrovascular disease, unspecified: Secondary | ICD-10-CM

## 2023-04-22 DIAGNOSIS — Z950 Presence of cardiac pacemaker: Secondary | ICD-10-CM

## 2023-04-22 DIAGNOSIS — Z79899 Other long term (current) drug therapy: Secondary | ICD-10-CM

## 2023-04-22 DIAGNOSIS — I10 Essential (primary) hypertension: Secondary | ICD-10-CM

## 2023-04-22 DIAGNOSIS — Z5181 Encounter for therapeutic drug level monitoring: Secondary | ICD-10-CM | POA: Diagnosis not present

## 2023-04-22 DIAGNOSIS — E7800 Pure hypercholesterolemia, unspecified: Secondary | ICD-10-CM | POA: Diagnosis not present

## 2023-04-22 DIAGNOSIS — I25708 Atherosclerosis of coronary artery bypass graft(s), unspecified, with other forms of angina pectoris: Secondary | ICD-10-CM

## 2023-04-22 DIAGNOSIS — I739 Peripheral vascular disease, unspecified: Secondary | ICD-10-CM

## 2023-04-22 DIAGNOSIS — I700 Atherosclerosis of aorta: Secondary | ICD-10-CM

## 2023-04-22 LAB — LIPID PANEL
Chol/HDL Ratio: 2.6 ratio (ref 0.0–4.4)
Cholesterol, Total: 225 mg/dL — ABNORMAL HIGH (ref 100–199)
HDL: 85 mg/dL (ref >39)
LDL Chol Calc (NIH): 111 mg/dL — ABNORMAL HIGH (ref 0–99)
Triglycerides: 174 mg/dL — ABNORMAL HIGH (ref 0–149)
VLDL Cholesterol Cal: 29 mg/dL (ref 5–40)

## 2023-04-22 NOTE — Patient Instructions (Signed)
 Medication Instructions:  No changes *If you need a refill on your cardiac medications before your next appointment, please call your pharmacy*   Lab Work: Lipid Panel If you have labs (blood work) drawn today and your tests are completely normal, you will receive your results only by: MyChart Message (if you have MyChart) OR A paper copy in the mail If you have any lab test that is abnormal or we need to change your treatment, we will call you to review the results.   Testing/Procedures: No Testing   Follow-Up: At [facility], you and your health needs are our priority.  As part of our continuing mission to provide you with exceptional heart care, we have created designated Provider Care Teams.  These Care Teams include your primary Cardiologist (physician) and Advanced Practice Providers (APPs -  Physician Assistants and Nurse Practitioners) who all work together to provide you with the care you need, when you need it.  We recommend signing up for the patient portal called "MyChart".  Sign up information is provided on this After Visit Summary.  MyChart is used to connect with patients for Virtual Visits (Telemedicine).  Patients are able to view lab/test results, encounter notes, upcoming appointments, etc.  Non-urgent messages can be sent to your provider as well.   To learn more about what you can do with MyChart, go to [email or web address].    Your next appointment:   6 month(s)  Provider:   Thurmon Fair, MD    Other Instructions   1st Floor: - Lobby - Registration  - Pharmacy  - Lab - Cafe  2nd Floor: - PV Lab - Diagnostic Testing (echo, CT, nuclear med)  3rd Floor: - Vacant  4th Floor: - TCTS (cardiothoracic surgery) - AFib Clinic - Structural Heart Clinic - Vascular Surgery  - Vascular Ultrasound  5th Floor: - [facility] Card[facility] (general and EP) - Clinical Pharmacy for coumadin, hypertension, lipid, weight-loss medications, and med  management appointments    Valet parking services will be available as well.

## 2023-04-22 NOTE — Progress Notes (Signed)
 Patient ID: [name], female   DOB: 1940/04/25, 83 y.o.   MRN: [id number]      Cardiology Office Note    Date:  04/22/2023   ID:  [name], DOB [birth year], MRN [id number]  PCP:  Georgina Quint, MD  Cardiologist:   Thurmon Fair, MD   Chief Complaint  Patient presents with   Pacemaker Check   Coronary Artery Disease   Irregular Heart Beat    Dofetilide monitoring     History of Present Illness:  [name] is a 83 y.o. female with coronary artery disease s/p bypass surgery (November 2016) and placement of drug-eluting stents (DES to distal SVG-RCA, April 2017 ) and left atrial appendage clipping, symptomatic paroxysmal atrial fibrillation and atrial flutter with radiofrequency ablation (cavotricuspid isthmus and pulmonary vein isolation), PAD with previous lower extremity stents, extensive intracranial atherosclerotic disease, history of GI bleeding while on anticoagulation, volatile hypertension, CKD stage III with occlusion of the right renal artery.  She was prescribed 2 courses of antibiotics as well as prednisone after an episode of sinus infection following influenza about a month ago.  She is still short of breath since that episode of influenza and does not feel like she has bounced back.  She has not had any problems with dizziness, palpitations or syncope.  She denies lower extremity edema.  She does not have claudication but does have bilateral hip pain.  She has not had any chest pain at rest or with usual activity and denies orthopnea, PND.  She had myopathy symptoms with rosuvastatin and is no longer taking that medication.  Her only lipid-lowering agent is ezetimibe 10 mg daily.  She has also, slacked off" from taking Repatha even though this did not cause her any side effects.  She had difficulty keeping up with the injections.  The most recent lipid profile that I can find was from April 2023.  Glycemic control has been good without medications with the most  recent hemoglobin A1c of 6.1%.  She was prescribed Pletal but has stopped taking it.  She does not recall if it made any difference in her leg symptoms.  She has bilateral ABIs even though she has mild stenosis in the right superficial femoral artery.  She has had substantial improvement in wellbeing following her last coronary revascularization procedure on 11/13/2020 she received a new drug-eluting stent to the SVG-RCA.  The echocardiogram shows only partial recovery of left ventricular systolic function with an ejection fraction of 45-50%, but there has also been substantial improvement in right ventricular function and marked reduction in the severity of tricuspid regurgitation.  She underwent full comprehensive pacemaker testing in the office today.  Her Medtronic Adapta device with Medtronic 5076 A and V leads is functioning normally.  It was implanted in 2013 and still has approximately 28 months of estimated generator longevity.  She has virtually 100% atrial paced, ventricular sensed rhythm.  No atrial activity is detected when pacing is taken down to 40 bpm, but she has normal AV conduction with atrial pacing.  She has good heart rate histogram distribution with the current sensor settings.  She has not had any atrial fibrillation in the last 12 months and only has had very brief episodes of nonsustained atrial tachycardia, max 12 seconds in duration.  She recovered well from COVID-19 infection in August 2021.  She was hospitalized with abdominal pain at [facility] on April 22, 2020.  Her work-up included an abdominal  CT that showed a relatively smaller size right kidney and suspicion for high-grade right renal artery stenosis.  Her creatinine was 1.1 at that time.  Incidental note was also made of a 1.6 cm right adrenal mass and prominent atherosclerosis in the infrarenal abdominal aorta. There was evidence of colonic distention and constipation which was felt to be the  cause of her symptoms.  She has a dual-chamber MRI conditional Medtronic Adapta pacemaker that shows 100% atrial paced, ventricular sensed rhythm, underlying rhythm being sinus bradycardia in the 40s.  She has had an excellent response to dofetilide without recent atrial fibrillation.  She has a history of left atrial appendage clipping so was not on anticoagulants.   She has a history of hyperkalemia with ACE inhibitors in the past.  Amiodarone caused hypothyroidism and alopecia.   She has tried taking statins but has repeatedly stopped them over the years due to musculoskeletal problems, but currently seems to be tolerating rosuvastatin 40 mg daily.  In late August 2020 she underwent an abdominal aortic angiogram with runoff, followed by angioplasty and drug coated stenting of a chronic total occlusion of the right superficial femoral artery. Preprocedure ABI was 0.82  Post procedure ABI was 1.1 at follow-up on October 18 2018, 1.0 in March 2021 (bilaterally).  By ultrasound performed in July 2021, known occluded mid right SFA stent with reconstitution above the level of the popliteal artery and three-vessel runoff, 80% stenosis in the proximal left SFA with widely patent mid left SFA stent.  She has normal left ventricular systolic function. She does have evidence of grade 2 diastolic dysfunction on previous echo. At the time of her bypass surgery she received an Atricure left atrial appendage clip. She is also on clopidogrel following her drug-eluting stents in April 2017. The pacemaker was implanted in 2013 for symptomatic sinus bradycardia. She also has a history of peripheral arterial disease and received bilateral superficial femoral artery stents in 2016, repeat revascularization with drug-coated stent to a totally occluded right superficial femoral artery in 2020. She has never smoked but drinks daily. She has treated hypertension.  After undergoing bypass surgery in November 2016 she returned  with non-ST segment elevation myocardial infarction in April 2017 and required placement of stents both in the native right coronary artery (3.020 mm Synergy DES) and in the saphenous vein graft to the distal right coronary artery (2.7538 mm Synergy DES) due to early graft dysfunction. The saphenous vein graft to the ramus intermedius was also occluded, but this vessel was left for medical therapy.   Echocardiogram in August 2022 showed abrupt reduction in LV function to an EF of 35-40% due to a new inferior wall motion abnormality.  Thallium viability study in September 2022 showed no evidence of myocardial infarction/scar, viability in all territories.  She underwent PCI to the SVG-RCA after cardiac catheterization on 11/13/2020 with symptomatic improvement.  Follow-up echo 01/24/2021 shows partial recovery of LVEF to 40-45 %, improved right ventricular systolic function and marked improvement in the severity of tricuspid insufficiency which is now only mild.  Past Medical History:  Diagnosis Date   ANXIETY 12/31/2009   Aortic insufficiency    a. mod by echo 2017.   CAD (coronary artery disease)    a. s/p CABGx3 and LAA clipping in 12/2014, NSTEMI 05/2015 s/p DES to native RCA and SVG-dRCA; occluded ramus-SVG was treated medically). c. neg nuc 10/2015 at [facility].   Chronic diastolic CHF (congestive heart failure) (HCC)    Chronic fatigue  CKD (chronic kidney disease), stage II    DEPRESSION 12/31/2009   DISC DISEASE, CERVICAL 12/31/2009   DISC DISEASE, LUMBAR 12/31/2009   DIVERTICULITIS, HX OF 12/31/2009   Essential hypertension    Gait abnormality 03/25/2017   GLUCOSE INTOLERANCE 12/31/2009   Habitual alcohol use    History of stroke    self reports " they say i have had some pin strokes"    Hypercalcemia    Hyperkalemia    Hyperlipidemia 12/31/2009   Hypothyroidism    MENOPAUSE, EARLY 12/31/2009   NSTEMI (non-ST elevated myocardial infarction) (HCC) 05/11/2015   Orthostatic hypotension     OSTEOARTHRITIS, HAND    Pacemaker 01/21/2012   MDT Adapta dual chamber   Paroxysmal atrial flutter (HCC)    Persistent atrial fibrillation (HCC)    PVD (peripheral vascular disease), hx stents to bil SFAs 02/2010    Rash, skin     superficial raised red pencil point sized rash bilateral forearms; states " it started when i started the Plavix "    Ruptured left breast implant    Symptomatic sinus bradycardia 01/22/2012   Syncope    a. 11/2015 ? due to medications.   Syncope    reports on 05-10-17 " i passed out 2 months ago in the bathroom and broke some ribs"     Tachy-brady syndrome (HCC)    a. s/p MDT PPM 2013.   Tricuspid regurgitation     Past Surgical History:  Procedure Laterality Date   ABDOMINAL AORTOGRAM W/LOWER EXTREMITY Bilateral 02/15/2017   Procedure: ABDOMINAL AORTOGRAM W/LOWER EXTREMITY;  Surgeon: Runell Gess, MD;  Location: [facility];  Service: Cardiovascular;  Laterality: Bilateral;  bilat   ABDOMINAL AORTOGRAM W/LOWER EXTREMITY N/A 10/06/2018   Procedure: ABDOMINAL AORTOGRAM W/LOWER EXTREMITY;  Surgeon: Runell Gess, MD;  Location: [facility];  Service: Cardiovascular;  Laterality: N/A;   Ablation of typical atrial flutter (CTI line)  09/05/2016   Dr Beatrix Fetters at [facility]    AUGMENTATION MAMMAPLASTY  1983   Removed 01/2018   BREAST IMPLANT REMOVAL Bilateral 01/14/2018   Procedure: REMOVAL BILATERAL BREAST IMPLANTS;  Surgeon: Glenna Fellows, MD;  Location: [facility];  Service: Plastics;  Laterality: Bilateral;   CAPSULECTOMY Bilateral 01/14/2018   Procedure: CAPSULECTOMY;  Surgeon: Glenna Fellows, MD;  Location: [facility];  Service: Plastics;  Laterality: Bilateral;   CARDIAC CATHETERIZATION N/A 01/01/2015   Procedure: Left Heart Cath and Coronary Angiography;  Surgeon: Corky Crafts, MD;  Location: [facility] CV LAB;  Service: Cardiovascular;  Laterality: N/A;   CARDIAC CATHETERIZATION N/A 05/12/2015    Procedure: Left Heart Cath and Coronary Angiography;  Surgeon: Lennette Bihari, MD;  Location: [facility];  Service: Cardiovascular;  Laterality: N/A;   CARDIAC CATHETERIZATION N/A 05/12/2015   Procedure: Coronary Stent Intervention;  Surgeon: Lennette Bihari, MD;  Location: [facility];  Service: Cardiovascular;  Laterality: N/A;   CARDIOVERSION N/A 02/01/2015   Procedure: CARDIOVERSION;  Surgeon: Lewayne Bunting, MD;  Location: [facility]OSCOPY;  Service: Cardiovascular;  Laterality: N/A;   CARDIOVERSION N/A 08/30/2015   Procedure: CARDIOVERSION;  Surgeon: Thurmon Fair, MD;  Location: [facility];  Service: Cardiovascular;  Laterality: N/A;   CARPAL TUNNEL RELEASE  2008   "right hand/thumb; carpal tunnel repair; got rid of arthritis" (01/21/2012)   CLIPPING OF ATRIAL APPENDAGE N/A 01/04/2015   Procedure: CLIPPING OF ATRIAL APPENDAGE;  Surgeon: Delight Ovens, MD;  Location: [facility];  Service:  Open Heart Surgery;  Laterality: N/A;   COLONOSCOPY N/A 12/21/2015   Procedure: COLONOSCOPY;  Surgeon: Rachael Fee, MD;  Location: [facility];  Service: Endoscopy;  Laterality: N/A;   CORONARY ARTERY BYPASS GRAFT N/A 01/04/2015   Procedure: CORONARY ARTERY BYPASS GRAFTING (CABG) x 3 using left internal mammory artery and greater saphenous vein right leg harvested endoscopically.;  Surgeon: Delight Ovens, MD;  LIMA-LAD, SVG-RI, SVG-PDA   CORONARY STENT INTERVENTION N/A 11/07/2020   Procedure: CORONARY STENT INTERVENTION;  Surgeon: Orbie Pyo, MD;  [facility];  Service: Cardiovascular;  Laterality: N/A;   ESOPHAGOGASTRODUODENOSCOPY (EGD) WITH PROPOFOL N/A 12/18/2015   Procedure: ESOPHAGOGASTRODUODENOSCOPY (EGD) WITH PROPOFOL;  Surgeon: Rachael Fee, MD;  Location: [facility];  Service: Endoscopy;  Laterality: N/A;   FACELIFT, LOWER 2/3  1995   "mini" (01/21/2012)   GIVENS CAPSULE STUDY N/A 12/21/2015   Procedure: GIVENS CAPSULE STUDY;  Surgeon: Rachael Fee, MD;  Location: [facility];  Service: Endoscopy;  Laterality: N/A;   LEFT HEART CATH AND CORS/GRAFTS ANGIOGRAPHY N/A 11/07/2020   Procedure: LEFT HEART CATH AND CORS/GRAFTS ANGIOGRAPHY;  Surgeon: Orbie Pyo, MD;  [facility]: [facility];  Service: Cardiovascular;  Laterality: N/A;   Lower Arterial Examination  10/28/2011   R. SFA stent mild-moderate mixed density plaque with elevated velocities consistent with 50% diameter reduction. L. SFA stent moderate mixed denisty plaque at mid to distal level consistent with 50-69% diameter reduction.   LOWER EXTREMITY ANGIOGRAPHY N/A 02/15/2017   Procedure: LOWER EXTREMITY ANGIOGRAPHY;  Surgeon: Runell Gess, MD;  [facility];  Service: Cardiovascular;  Laterality: N/A;   OOPHORECTOMY  ~1979   PARATHYROIDECTOMY N/A 05/13/2017   Procedure: PARATHYROIDECTOMY;  Surgeon: Darnell Level, MD;  Location: [facility];  Service: General;  Laterality: N/A;   PARTIAL COLECTOMY  2010   PERIPHERAL ARTERIAL STENT GRAFT  2012; 2012   "LLE; RLE" (01/21/2012)   PERIPHERAL VASCULAR BALLOON ANGIOPLASTY Right 02/15/2017   Procedure: PERIPHERAL VASCULAR BALLOON ANGIOPLASTY;  Surgeon: Runell Gess, MD;  [facility];  Service: Cardiovascular;  Laterality: Right;  SFA     PERIPHERAL VASCULAR INTERVENTION Right 10/06/2018   Procedure: PERIPHERAL VASCULAR INTERVENTION;  Surgeon: Runell Gess, MD;  [facility]: [facility];  Service: Cardiovascular;  Laterality: Right;   PERMANENT PACEMAKER INSERTION N/A 01/21/2012   Medtronic Adapta L implanted by Dr Royann Shivers for tachy/brady syndrome   POSTERIOR CERVICAL LAMINECTOMY  1985   TEE WITHOUT CARDIOVERSION N/A 01/04/2015   Procedure: TRANSESOPHAGEAL ECHOCARDIOGRAM (TEE);  Surgeon: Delight Ovens, MD;  Location: [facility] OR;  Service: Open Heart Surgery;  Laterality: N/A;   TEE WITHOUT CARDIOVERSION N/A 02/01/2015   Procedure: TRANSESOPHAGEAL ECHOCARDIOGRAM (TEE);  Surgeon: Lewayne Bunting, MD;  Location: [facility] ENDOSCOPY;  Service: Cardiovascular;  Laterality: N/A;   TEE WITHOUT CARDIOVERSION N/A 08/30/2015   Procedure: TRANSESOPHAGEAL ECHOCARDIOGRAM (TEE);  Surgeon: Thurmon Fair, MD;  Location: [facility] ENDOSCOPY;  Service: Cardiovascular;  Laterality: N/A;   VAGINAL HYSTERECTOMY  1975    Current Medications: Outpatient Medications Prior to Visit  Medication Sig Dispense Refill   amLODipine (NORVASC) 5 MG tablet TAKE 1 TABLET (5 MG TOTAL) BY MOUTH IN THE MORNING AND AT BEDTIME 180 tablet 0   ascorbic acid (VITAMIN C) 250 MG CHEW Chew 750 mg by mouth daily.     aspirin EC 81 MG tablet Take 81 mg by mouth daily.      Biotin 40981 MCG TABS  Take 10,000 mcg by mouth daily.     carvedilol (COREG) 6.25 MG tablet Take 1 tablet (6.25 mg total) by mouth 2 (two) times daily with a meal. 180 tablet 0   Cholecalciferol (VITAMIN D) 50 MCG (2000 UT) tablet Take 2,000 Units by mouth daily.     cilostazol (PLETAL) 50 MG tablet Take 1 tablet (50 mg total) by mouth 2 (two) times daily. 180 tablet 3   Cyanocobalamin (B-12) 5000 MCG CAPS Take 10,000 mcg by mouth daily.     dofetilide (TIKOSYN) 125 MCG capsule TAKE ONE CAPSULE BY MOUTH EVERY TWELVE HOURS. Please call [phone] to schedule an overdue appointment with Dr. Thurmon Fair for future refills. Thank you. 1st attempt 60 capsule 0   doxycycline (VIBRA-TABS) 100 MG tablet Take 1 tablet (100 mg total) by mouth 2 (two) times daily for 10 days. 20 tablet 0   ezetimibe (ZETIA) 10 MG tablet Take 1 tablet (10 mg total) by mouth daily. 90 tablet 0   loteprednol (LOTEMAX) 0.5 % ophthalmic suspension Place 2 drops into both eyes See admin instructions. Instill 2 drops into both eyes 2 times a day twice weekly     Magnesium 200 MG TABS Take 200 mg by mouth daily.     Multiple Vitamin (MULTIVITAMIN WITH MINERALS) TABS tablet Take 1 tablet by mouth 2 (two) times daily.     Multiple Vitamins-Minerals (ZINC PO) Take 150 mg by mouth daily.     VITAMIN  D-VITAMIN K PO Take 1 capsule by mouth daily.     citalopram (CELEXA) 40 MG tablet TAKE 1 TABLET BY MOUTH EVERY DAY (Patient not taking: Reported on 04/22/2023) 90 tablet 1   diclofenac (VOLTAREN) 75 MG EC tablet Take 1 tablet (75 mg total) by mouth 2 (two) times daily. (Patient not taking: Reported on 04/22/2023) 30 tablet 0   Evolocumab (REPATHA SURECLICK) 140 MG/ML SOAJ Inject 140 mg into the skin every 14 (fourteen) days. (Patient not taking: Reported on 04/22/2023) 6 mL 3   nitroGLYCERIN (NITROSTAT) 0.4 MG SL tablet Place 1 tablet (0.4 mg total) under the tongue every 5 (five) minutes x 3 doses as needed for chest pain. (Patient not taking: Reported on 04/22/2023) 25 tablet 2   oseltamivir (TAMIFLU) 30 MG capsule Take 1 capsule (30 mg total) by mouth 2 (two) times daily. (Patient not taking: Reported on 04/22/2023) 10 capsule 0   predniSONE (DELTASONE) 20 MG tablet Take 1 tablet (20 mg total) by mouth daily with breakfast for 5 days. (Patient not taking: Reported on 04/22/2023) 5 tablet 0   rosuvastatin (CRESTOR) 10 MG tablet Take 1 tablet (10 mg total) by mouth daily. (Patient not taking: Reported on 04/22/2023) 90 tablet 0   No facility-administered medications prior to visit.     Allergies:   Brilinta [ticagrelor], Atorvastatin, Clonidine derivatives, Crestor [rosuvastatin calcium], Exforge [amlodipine besylate-valsartan], and Spironolactone   Social History   Socioeconomic History   Marital status: Widowed    Spouse name: Not on file   Number of children: 3   Years of education: Not on file   Highest education level: Not on file  Occupational History   Occupation: Retired Advertising account executive: RETIRED  Tobacco Use   Smoking status: Former    Current packs/day: 0.00    Average packs/day: 0.8 packs/day for 10.0 years (7.5 ttl pk-yrs)    Types: Cigarettes    Start date: 72    Quit date: 2008    Years since quitting: 17.2  Smokeless tobacco: Never   Tobacco comments:     01/21/2012 "quit smoking ~ 2002"  Vaping Use   Vaping status: Never Used  Substance and Sexual Activity   Alcohol use: Yes    Comment: daily coctail   Drug use: No   Sexual activity: Not Currently  Other Topics Concern   Not on file  Social History Narrative   Lives in [place].  Retired.   Social Drivers of Corporate investment banker Strain: Low Risk  (02/11/2022)   Overall Financial Resource Strain (CARDIA)    Difficulty of Paying Living Expenses: Not very hard  Food Insecurity: No Food Insecurity (01/28/2022)   Hunger Vital Sign    Worried About Running Out of Food in the Last Year: Never true    Ran Out of Food in the Last Year: Never true  Transportation Needs: No Transportation Needs (01/28/2022)   PRAPARE - Administrator, Civil Service (Medical): No    Lack of Transportation (Non-Medical): No  Physical Activity: Sufficiently Active (03/25/2022)   Exercise Vital Sign    Days of Exercise per Week: 3 days    Minutes of Exercise per Session: 60 min  Stress: No Stress Concern Present (03/02/2022)   [employer] of Occupational Health - Occupational Stress Questionnaire    Feeling of Stress : Not at all  Social Connections: Moderately Integrated (04/08/2022)   Social Connection and Isolation Panel [NHANES]    Frequency of Communication with Friends and Family: More than three times a week    Frequency of Social Gatherings with Friends and Family: More than three times a week    Attends Religious Services: More than 4 times per year    Active Member of [employer] or Organizations: Yes    Attends Banker Meetings: More than 4 times per year    Marital Status: Widowed  Recent Concern: Social Connections - Moderately Isolated (04/08/2022)   Social Connection and Isolation Panel [NHANES]    Frequency of Communication with Friends and Family: Once a week    Frequency of Social Gatherings with Friends and Family: Once a week    Attends Religious Services:  More than 4 times per year    Active Member of [employer] or Organizations: Yes    Attends Banker Meetings: More than 4 times per year    Marital Status: Widowed     Family History:  The patient's family history includes CVA in her brother; Heart attack in her brother; Heart disease in her brother, father, and mother; Hypertension in her brother and mother; Stroke in her father and mother.   ROS:   Please see the history of present illness.   All other systems are reviewed and are negative.   PHYSICAL EXAM:   VS:  BP 128/72 (BP Location: Left Arm, Patient Position: Sitting, Cuff Size: Normal)   Pulse 92   Ht 5\' 3"  (1.6 m)   Wt 141 lb 12.8 oz (64.3 kg)   SpO2 96%   BMI 25.12 kg/m     Wt Readings from Last 3 Encounters:  04/22/23 141 lb 12.8 oz (64.3 kg)  04/19/23 145 lb 6 oz (65.9 kg)  03/25/23 147 lb 0.8 oz (66.7 kg)      General: Alert, oriented x3, no distress, healthy left subclavian pacemaker site.  She does not appear pale Head: no evidence of trauma, PERRL, EOMI, no exophtalmos or lid lag, no myxedema, no xanthelasma; normal ears, nose and oropharynx Neck: normal jugular  venous pulsations and no hepatojugular reflux; brisk carotid pulses without delay and no carotid bruits Chest: clear to auscultation, no signs of consolidation by percussion or palpation, normal fremitus, symmetrical and full respiratory excursions Cardiovascular: normal position and quality of the apical impulse, regular rhythm, normal first and second heart sounds, no murmurs, rubs or gallops Abdomen: no tenderness or distention, no masses by palpation, no abnormal pulsatility or arterial bruits, normal bowel sounds, no hepatosplenomegaly Extremities: no clubbing, cyanosis or edema; 2+ radial, ulnar and brachial pulses bilaterally; 2+ pedal pulses bilaterally.  She has bilateral femoral bruits Neurological: grossly nonfocal Psych: Normal mood and affect     Studies/Labs Reviewed:   ECHO  01/24/2021  1. Hypokinesis of the basal to mid inferoseptum and basal to mid inferior  myocardium. Left ventricular ejection fraction, by estimation, is 40 to  45%. The left ventricle has mildly decreased function. The left ventricle  demonstrates regional wall motion   abnormalities (see scoring diagram/findings for description). Left  ventricular diastolic parameters are consistent with Grade II diastolic  dysfunction (pseudonormalization).   2. Right ventricular systolic function is normal. The right ventricular  size is normal. There is normal pulmonary artery systolic pressure.   3. The mitral valve is normal in structure. No evidence of mitral valve  regurgitation. No evidence of mitral stenosis.   4. The aortic valve is normal in structure. Aortic valve regurgitation is  mild to moderate. No aortic stenosis is present. Aortic regurgitation PHT  measures 376 msec.   5. The inferior vena cava is normal in size with greater than 50%  respiratory variability, suggesting right atrial pressure of 3 mmHg.   Thallium rest redistribution viability study 10/28/2020 :   Prior study available for comparison. Performed at [facility] 12/2015.  Negative per patient.  No results available for review   The rest and delayed thallium study shows viable myocardium in all segments of the LV   There is no evidence of previous myocardial infarction .   ECHO 09/24/2020:   1. Compared with the echo 08/2019, systolic function has worsened. Left ventricular ejection fraction, by estimation, is 35 to 40%. The left ventricle has moderately decreased function. The left ventricle demonstrates global hypokinesis. There is mild concentric left ventricular hypertrophy. Left ventricular diastolic parameters are consistent with Grade II diastolic dysfunction (pseudonormalization). Elevated left ventricular end-diastolic pressure.   2. Right ventricular systolic function is normal. The right ventricular size is normal. There is  moderately elevated pulmonary artery systolic pressure.   3. Left atrial size was moderately dilated.   4. The mitral valve is normal in structure. Mild mitral valve regurgitation. No evidence of mitral stenosis.   5. Tricuspid valve regurgitation is moderate to severe.   6. The aortic valve is tricuspid. There is mild calcification of the  aortic valve. There is mild thickening of the aortic valve. Aortic valve  regurgitation is mild to moderate. No aortic stenosis is present.   7. The inferior vena cava is normal in size with greater than 50%  respiratory variability, suggesting right atrial pressure of 3 mmHg.   Cardiac catheterization 05/12/2015 Mid LAD lesion, 85% stenosed. Ramus lesion, 80% stenosed. Prox Cx to Mid Cx lesion, 40% stenosed. SVG was injected is normal in caliber. There is severe disease in the graft. Ostial occlusion of SVG to the ramus intermediate vessel. Origin to Prox Graft lesion, 100% stenosed. LIMA was injected is normal in caliber, and is anatomically normal. There is competitive flow. Widely patent LIMA graft which  supplies the mid LAD. SVG was injected is large. There is severe disease in the graft. Severely diseased proximal to mid portion of the vein graft which supplied the PDA vessel. Of note, the distal RCA. Antegrade to the PDA was totally occluded and did not supply the proximal to mid RCA due to this occlusion. Prox Graft lesion, 70% stenosed. Dist RCA-1 lesion, 100% stenosed. Dist RCA-2 lesion, 100% stenosed. Origin lesion, 95% stenosed. Post intervention, there is a 0% residual stenosis. Prox RCA lesion, 95% stenosed. Post intervention, there is a 0% residual stenosis. There is moderate left ventricular systolic dysfunction.   Significant native multivessel CAD with diffuse 85% proximal LAD stenosis with evidence for competitive filling via the LIMA graft supplying the mid LAD; 75-80% diffuse proximal ramus intermediate stenosis with evidence  for an occluded  vein graft anastomosed in its midst portion of the vessel; 40% left circumflex stenosis, and 95% proximal RCA stenosis which supplies 2 moderate size marginal branches prior to being totally occluded in the region of the acute margin/distal RCA.   Severely diseased SVG which supplies a large distal RCA system giving rise to several branches and anastomosing into the PDA.  The native distal RCA is occluded a short distance prior to the PDA takeoff and the graft does not supply the proximal - mid native vessel due to this occlusion.     Occluded SVG at its origin to the ramus intermediate vessel.   Patent LIMA graft supplying the mid LAD.   Successful percutaneous coronary intervention of the proximal to mid vein graft supplying the large distal RCA with ultimate insertion of a 2.7538 mm Synergy DES stent postdilated to 2.91 mm with the 95 and diffuse 70% stenosis being reduced to 0%.   Successful percutaneous coronary intervention to the proximal native RCA which supplies 2 moderate size marginal branches prior to the acute margin/ distal RCA occlusion with PTCA/ 3.020 mm Synergy DES stent postdilated to 3.25 mm with the 95% stenosis being reduced to 0%.   RECOMMENDATION: Ms. Meneely has a history of rash secondary to Plavix.  She will be treated with aspirin/Brilinta forward DAPT therapy.  She had experienced continued anginal type symptoms, although mild, despite her bypass surgery which may have been due to a proximal RCA.  Subsequently she has developed unstable angina leading to her non-STEMI.  Following successful intervention, the SVG to her large distal RCA is now wide open as is her proximal native RCA which supplies two marginal branches which were not supplied by the SVG graft.  If the patient continues to experience increasing symptoms of chest pain consider PCI to the ramus intermediate vessel since the graft that supplied this is occluded.  Diagnostic Dominance:  Right Intervention  Implants     Permanent Stent  Stent Synergy Des 2.75x38 - [id number] - Implanted  Stent Synergy Des 3x20 - [id number] - Implanted    Cardiac Cath 11/07/2020    Ramus lesion is 65% stenosed.   Prox LAD lesion is 80% stenosed.   Mid LAD lesion is 100% stenosed.   Dist RCA lesion is 100% stenosed.   Origin lesion is 100% stenosed.   Origin to Prox Graft lesion is 99% stenosed.   Previously placed Prox RCA stent (unknown type) is  widely patent.   A drug-eluting stent was successfully placed using a STENT ONYX FRONTIER 3.0X18.   Post intervention, there is a 0% residual stenosis.   LIMA graft was visualized by angiography and is normal in caliber.  SVG graft was not injected.   The graft exhibits no disease.   LV end diastolic pressure is normal.    High grade in-stent restenosis in vein graft to PDA treated with one drug-eluting stent. Moderate unchanged disease of unrevascularized ramus intermedius branch. Patent LIMA to LAD with known occlusion of vein graft to ramus intermedius. Normal LVEDP.   Recommendations:  Continue DAPT and aggressive cardiovascular risk factor modification.  Anticipate same day discharge if criteria are met.     Coronary Diagrams  Diagnostic Dominance: Right Intervention  Implants     Permanent Stent  Stent Onyx Frontier 3.0x18 - [id number] - Implanted Inventory item: STENT ONYX FRONTIER 3.0X18      EKG: Reviewed ECG from ER visit 03/25/2023 that shows atrial paced, ventricular sensed rhythm and appropriate QTc around 483 ms (on dofetilide)  EKG Interpretation Date/Time:    Ventricular Rate:    PR Interval:    QRS Duration:    QT Interval:    QTC Calculation:   R Axis:      Text Interpretation:           Recent Labs: 03/25/2023: BUN 12; Creatinine, Ser 1.20; Hemoglobin 14.7; Platelets 220; Potassium 4.2; Sodium 133   Lipid Panel    Component Value Date/Time   CHOL 137 05/28/2021 1412   CHOL 202 (H)  06/11/2020 0952   TRIG 239.0 (H) 05/28/2021 1412   HDL 55.30 05/28/2021 1412   HDL 89 06/11/2020 0952   CHOLHDL 2 05/28/2021 1412   VLDL 47.8 (H) 05/28/2021 1412   LDLCALC 84 06/11/2020 0952   LDLDIRECT 68.0 05/28/2021 1412    ASSESSMENT:    1. Coronary artery disease of bypass graft of native heart with stable angina pectoris (HCC)   2. PAF (paroxysmal atrial fibrillation) (HCC)   3. Encounter for monitoring dofetilide therapy   4. SSS (sick sinus syndrome) (HCC)   5. Pacemaker   6. PAD (peripheral artery disease) (HCC)   7. Renal artery stenosis (HCC)   8. Aortic atherosclerosis (HCC)   9. Cerebrovascular small vessel disease   10. Essential hypertension   11. Pure hypercholesterolemia          PLAN:  In order of problems listed above:  CAD: Currently asymptomatic even though we stopped long-acting nitrates and reduce her dose of amlodipine at her last appointment.  She continues to also take carvedilol.  She had substantial functional improvement following the PCI to RCA and SVG to RCA, even though the echocardiogram shows only partial recovery of LVEF which remains mildly depressed.  We had planned lifelong dual antiplatelet therapy due to the extent and complexity of her coronary and peripheral arterial disease, but she is currently taking only aspirin.  I think the clopidogrel may have been discontinued when she was started on cilostazol for possible PAD symptoms, but she is no longer taking that medication either.  She is generally had difficulty keeping up with multiple medications and I think at this time is probably safest to continue her on aspirin monotherapy. AFib: Not on anticoagulation following left atrial appendage clipping.  She has had an excellent clinical response to ablation and dofetilide therapy and she has not had any episodes of atrial fibrillation in over 2 years. Dofetilide: Dose is adjusted for renal function.  Most recent QTc appropriate at 480 ms.  No  evidence of pro arrhythmia.  Reminded her about the risk of drug interactions with QT prolongation, particularly with antibiotics that could be prescribed for respiratory infection such  as macrolides or quinolones.  She should always ask about potential drug interactions been receiving new prescriptions. SSS: Did not find any underlying atrial rhythm when pacing is taken down to 40 bpm making her "atrially dependent" but she has normal AV conduction.  Appropriate heart rate histogram distribution. PPM: Normal pacemaker function.  Continue remote downloads every 3 months.  Should be considered pacemaker dependent. CHF: She has not had evidence of hypervolemia and has not needed diuretics since we performed her last PCI in 2022.    Had problems with diastolic heart failure even before the drop in LV function.  We have previously discussed treatment with SGLT2 inhibitors but she wants to simplify rather than then add medications.   PAD: Recently reevaluated by Dr. Allyson Sabal.  No plan for invasive angiography.  Has not had any problems with claudication since her last PTA in August 2021.  She has normal bilateral ABI despite some restenosis in her right SFA.  Lower blood pressure in her left upper extremity consistent with left subclavian stenosis and extensive aortic atherosclerosis without aneurysm.  On aspirin. 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  However her creatinine has stabilized around 1.2, GFR around 40. ASCVD: She has mostly small vessel disease and no stenosis in the major extracranial arteries.  We need to avoid hypotension and it is best to allow her systolic blood pressure around 130-140 to avoid ischemic cerebral injury.   HTN: Always check the blood pressure and the right upper extremity.  Avoid RAS inhibitors due to history of hyperkalemia when taking lisinopril Hypercholesterolemia: She has had statin myopathy  with multiple agents.  He did well on Repatha but somehow has stopped taking it, I think just because filling the prescription seem to be too complicated.  She is only taking ezetimibe.  I doubt that this is anywhere close to sufficient for a target LDL cholesterol of less than 70.  Rechecking labs today. TR: Cannot hear this on physical exam, suggesting that it is not significant.  She did develop moderate to severe on TEE 2017 before SVG-RCA stent, now back to mild suggesting that a large part of her problems were related to right ventricular ischemia.  The previous increased severity TR cannot be attributed to her pacemaker lead. 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   Recent influenza and sinus infection: Recovering from this.  I think this is the most likely cause for her slight worsening shortness of breath.  If she does not get better in about a month we should perform another echocardiogram to make sure there has been no new reduction in LV function.     Medication Adjustments/Labs and Tests Ordered: Current medicines are reviewed at length with the patient today.  Concerns regarding medicines are outlined above.  Medication changes, Labs and Tests ordered today are listed in the Patient Instructions below. Patient Instructions  Medication Instructions:  No changes *If you need a refill on your cardiac medications before your next appointment, please call your pharmacy*   Lab Work: Lipid Panel If you have labs (blood work) drawn today and your tests are completely normal, you will receive your results only by: MyChart Message (if you have MyChart) OR A paper copy in the mail If you have any lab test that is abnormal or we need to change your treatment, we will call you to review  the results.   Testing/Procedures: No Testing   Follow-Up: At [facility], you and your health  needs are our priority.  As part of our continuing mission to provide you with exceptional heart care, we have created designated Provider Care Teams.  These Care Teams include your primary Cardiologist (physician) and Advanced Practice Providers (APPs -  Physician Assistants and Nurse Practitioners) who all work together to provide you with the care you need, when you need it.  We recommend signing up for the patient portal called "MyChart".  Sign up information is provided on this After Visit Summary.  MyChart is used to connect with patients for Virtual Visits (Telemedicine).  Patients are able to view lab/test results, encounter notes, upcoming appointments, etc.  Non-urgent messages can be sent to your provider as well.   To learn more about what you can do with MyChart, go to [email or web address].    Your next appointment:   6 month(s)  Provider:   Thurmon Fair, MD    Other Instructions   1st Floor: - Lobby - Registration  - Pharmacy  - Lab - Cafe  2nd Floor: - PV Lab - Diagnostic Testing (echo, CT, nuclear med)  3rd Floor: - Vacant  4th Floor: - TCTS (cardiothoracic surgery) - AFib Clinic - Structural Heart Clinic - Vascular Surgery  - Vascular Ultrasound  5th Floor: - HeartCare Cardiology (general and EP) - Clinical Pharmacy for coumadin, hypertension, lipid, weight-loss medications, and med management appointments    Valet parking services will be available as well.         Signed, Thurmon Fair, MD  04/22/2023 5:44 PM    [facility] [address] [place], [place], [place]  [address] Phone: [phone]; Fax: [phone]

## 2023-04-23 ENCOUNTER — Encounter: Payer: Self-pay | Admitting: Cardiovascular Disease

## 2023-04-26 ENCOUNTER — Telehealth: Payer: Self-pay | Admitting: *Deleted

## 2023-04-26 DIAGNOSIS — E785 Hyperlipidemia, unspecified: Secondary | ICD-10-CM

## 2023-04-26 DIAGNOSIS — E7800 Pure hypercholesterolemia, unspecified: Secondary | ICD-10-CM

## 2023-04-26 MED ORDER — REPATHA SURECLICK 140 MG/ML ~~LOC~~ SOAJ: 140.0000 mg | SUBCUTANEOUS | 3 refills | Status: DC

## 2023-04-26 NOTE — Telephone Encounter (Signed)
 Spoke with pt, she agrees to restart the repatha. New script sent to the pharmacy  Lab orders mailed to the pt

## 2023-04-26 NOTE — Telephone Encounter (Signed)
-----   Message from [facility] sent at 04/23/2023 11:41 AM EDT ----- So repatha is better. Could we please resume Repatha 140 mg every 2 weeks and repata a lipid profile in 3 months? ----- Message ----- From: Olene Floss, RPH-CPP Sent: 04/23/2023  10:42 AM EDT To: Thurmon Fair, MD  She would be responsible for 20% of the cost. Although we could use the [employer] grant to cover cost of drug, the billing may get a little complicated and she would have to pay an administration fee. ----- Message ----- From: Sigurd Sos, RN Sent: 04/23/2023   9:38 AM EDT To: [facility]   ----- Message ----- From: Thurmon Fair, MD Sent: 04/23/2023   8:54 AM EDT To: [facility]  These labs are on ezetimibe only. PAD, CAD, statin myopathy. She has trouble "keeping up" with the Repatha shots, although no side effects. Is her insurance OK with [employer]?

## 2023-05-17 ENCOUNTER — Telehealth: Payer: Self-pay | Admitting: Cardiovascular Disease

## 2023-05-17 MED ORDER — DOFETILIDE 125 MCG PO CAPS: 125.0000 ug | ORAL_CAPSULE | Freq: Two times a day (BID) | ORAL | 3 refills | Status: AC

## 2023-05-17 NOTE — Telephone Encounter (Signed)
 Pt c/o medication issue:  1. Name of Medication:   dofetilide (TIKOSYN) 125 MCG capsule    2. How are you currently taking this medication (dosage and times per day)?  TAKE ONE CAPSULE BY MOUTH EVERY TWELVE HOURS. Please call [phone] to schedule an overdue appointment with Dr. Thurmon Fair for future refills. Thank you. 1st attempt       3. Are you having a reaction (difficulty breathing--STAT)? No  4. What is your medication issue? Pt is requesting a callback regarding her losing the refill she just received last week while she was out of town. She stated she hadn't had this medication since Saturday so she'd missed her dose for yesterday and today. Since being off of it for now 2 days she'd like to know if she can continue to stay off of it and see how she does or should she request another refill since she has misplaced the most recent one so now she'd out. Please advise

## 2023-05-17 NOTE — Telephone Encounter (Signed)
 Called and spoke to T[place]o [place] with [facility]  Toniann Fail states:   -next fill date per insurance is 4/26  -cost for a 3 week supply is $23.71  Informed Toniann Fail:  -RN will outreach patient and inform of cost  -patient may likely call and request 3 week supply  Toniann Fail agrees with plan

## 2023-05-17 NOTE — Telephone Encounter (Signed)
 Patient identification verified by 2 forms. Marilynn Rail, RN    Called and spoke to patient  Informed patient:   -Per pharmacy 3 week supply for Tiskosyn will be $23.71   -Per 3/13 OV recommend continue using Tikosyn   -can contact pharmacy for refill request  Patient verbalized understanding, no questions at this time

## 2023-06-03 ENCOUNTER — Ambulatory Visit (HOSPITAL_BASED_OUTPATIENT_CLINIC_OR_DEPARTMENT_OTHER)
Admission: RE | Admit: 2023-06-03 | Discharge: 2023-06-03 | Disposition: A | Discharge status: Home / Self Care | Source: Ambulatory Visit | Attending: Cardiovascular Disease | Admitting: Cardiovascular Disease

## 2023-06-03 ENCOUNTER — Ambulatory Visit (HOSPITAL_COMMUNITY)
Admission: RE | Admit: 2023-06-03 | Discharge: 2023-06-03 | Disposition: A | Discharge status: Home / Self Care | Source: Ambulatory Visit | Attending: Cardiovascular Disease | Admitting: Cardiovascular Disease

## 2023-06-03 ENCOUNTER — Other Ambulatory Visit: Payer: Self-pay

## 2023-06-03 DIAGNOSIS — I739 Peripheral vascular disease, unspecified: Secondary | ICD-10-CM | POA: Diagnosis not present

## 2023-06-03 LAB — VAS US ABI WITH/WO TBI
Left ABI: 0.9
Right ABI: 0.93

## 2023-06-07 ENCOUNTER — Other Ambulatory Visit (HOSPITAL_COMMUNITY): Payer: Self-pay

## 2023-06-07 DIAGNOSIS — I739 Peripheral vascular disease, unspecified: Secondary | ICD-10-CM

## 2023-07-15 ENCOUNTER — Other Ambulatory Visit: Payer: Self-pay | Admitting: Cardiovascular Disease

## 2023-07-20 ENCOUNTER — Encounter: Payer: Self-pay | Admitting: Cardiovascular Disease

## 2023-07-30 ENCOUNTER — Emergency Department (HOSPITAL_COMMUNITY)
Admission: EM | Admit: 2023-07-30 | Discharge: 2023-07-30 | Disposition: A | Discharge status: Home / Self Care | Attending: Emergency Medicine | Admitting: Emergency Medicine

## 2023-07-30 DIAGNOSIS — M546 Pain in thoracic spine: Secondary | ICD-10-CM | POA: Diagnosis not present

## 2023-07-30 DIAGNOSIS — Z79899 Other long term (current) drug therapy: Secondary | ICD-10-CM | POA: Insufficient documentation

## 2023-07-30 DIAGNOSIS — I10 Essential (primary) hypertension: Secondary | ICD-10-CM | POA: Diagnosis not present

## 2023-07-30 DIAGNOSIS — M436 Torticollis: Secondary | ICD-10-CM | POA: Diagnosis not present

## 2023-07-30 DIAGNOSIS — I130 Hypertensive heart and chronic kidney disease with heart failure and stage 1 through stage 4 chronic kidney disease, or unspecified chronic kidney disease: Secondary | ICD-10-CM | POA: Insufficient documentation

## 2023-07-30 DIAGNOSIS — Z951 Presence of aortocoronary bypass graft: Secondary | ICD-10-CM | POA: Insufficient documentation

## 2023-07-30 DIAGNOSIS — I5032 Chronic diastolic (congestive) heart failure: Secondary | ICD-10-CM | POA: Diagnosis not present

## 2023-07-30 DIAGNOSIS — Z7982 Long term (current) use of aspirin: Secondary | ICD-10-CM | POA: Diagnosis not present

## 2023-07-30 DIAGNOSIS — E039 Hypothyroidism, unspecified: Secondary | ICD-10-CM | POA: Insufficient documentation

## 2023-07-30 DIAGNOSIS — I2510 Atherosclerotic heart disease of native coronary artery without angina pectoris: Secondary | ICD-10-CM | POA: Diagnosis not present

## 2023-07-30 DIAGNOSIS — R519 Headache, unspecified: Secondary | ICD-10-CM | POA: Diagnosis not present

## 2023-07-30 DIAGNOSIS — N182 Chronic kidney disease, stage 2 (mild): Secondary | ICD-10-CM | POA: Insufficient documentation

## 2023-07-30 DIAGNOSIS — M542 Cervicalgia: Secondary | ICD-10-CM | POA: Diagnosis not present

## 2023-07-30 MED ORDER — METHOCARBAMOL 500 MG PO TABS: 500.0000 mg | ORAL_TABLET | Freq: Three times a day (TID) | ORAL | 0 refills | Status: DC | PRN

## 2023-07-30 MED ORDER — KETOROLAC TROMETHAMINE 60 MG/2ML IM SOLN
30.0000 mg | Freq: Once | INTRAMUSCULAR | Status: AC
  Administered 2023-07-30: 30 mg via INTRAMUSCULAR
  Filled 2023-07-30: qty 2

## 2023-07-30 NOTE — ED Triage Notes (Signed)
 Patient arrived from home with complaints of spasms in her neck, shoulders, and back of her head over the last few days. Declines any recent falls or injury.

## 2023-07-30 NOTE — Discharge Instructions (Signed)
 You may use over-the-counter Ibuprofen (Motrin. Advil) or Naproxen (Aleve) not both. Additionally you can take Acetaminophen  (Tylenol ), topical muscle creams such as SalonPas, [employer]ogul, Bengay, etc. Please stretch, apply ice or heat (whichever helps), and have massage therapy for additional assistance.  For pain control you may take 1000 mg of acetaminophen  (Tylenol ) every 8 hours with or without either 600 mg of Ibuprofen (Motrin, Advil, etc.) every 6-8 hours or 220 mg of Naproxen (Aleve) every 12 hours as needed. Limit the use of NSAIDs to no more than 7-10 days.  Please limit 24 hr usage of the following medicines: - Acetaminophen  (Tylenol ) to 4000 mg - Ibuprofen (Motrin, Advil, etc.) to 2400 mg - Naproxen (Aleve) to 880 mg    Please note that other over-the-counter medicine may contain acetaminophen  or ibuprofen as a component of their ingredients.

## 2023-07-30 NOTE — ED Provider Notes (Signed)
  EMERGENCY DEPARTMENT AT [facility] Provider Note  CSN: [id number] Arrival date & time: 07/30/23 0550  Chief Complaint(s) Spasms  HPI [name] is a 83 y.o. female here for 3 days of bilateral right greater than left neck and upper back pain and stiffness.  Patient reports awakening 3 days with the pain.  It is worse with movements and range of motion of her head.  She denies any fall or trauma.  No weakness.  No recent fevers or infections.  Has tried previously prescribed narcotics without relief.  The history is provided by the patient.    Past Medical History Past Medical History:  Diagnosis Date   ANXIETY 12/31/2009   Aortic insufficiency    a. mod by echo 2017.   CAD (coronary artery disease)    a. s/p CABGx3 and LAA clipping in 12/2014, NSTEMI 05/2015 s/p DES to native RCA and SVG-dRCA; occluded ramus-SVG was treated medically). c. neg nuc 10/2015 at [facility].   Chronic diastolic CHF (congestive heart failure) (HCC)    Chronic fatigue    CKD (chronic kidney disease), stage II    DEPRESSION 12/31/2009   DISC DISEASE, CERVICAL 12/31/2009   DISC DISEASE, LUMBAR 12/31/2009   DIVERTICULITIS, HX OF 12/31/2009   Essential hypertension    Gait abnormality 03/25/2017   GLUCOSE INTOLERANCE 12/31/2009   Habitual alcohol use    History of stroke    self reports  they say i have had some pin strokes    Hypercalcemia    Hyperkalemia    Hyperlipidemia 12/31/2009   Hypothyroidism    MENOPAUSE, EARLY 12/31/2009   NSTEMI (non-ST elevated myocardial infarction) (HCC) 05/11/2015   Orthostatic hypotension    OSTEOARTHRITIS, HAND    Pacemaker 01/21/2012   MDT Adapta dual chamber   Paroxysmal atrial flutter (HCC)    Persistent atrial fibrillation (HCC)    PVD (peripheral vascular disease), hx stents to bil SFAs 02/2010    Rash, skin     superficial raised red pencil point sized rash bilateral forearms; states  it started when i started the Plavix      Ruptured left  breast implant    Symptomatic sinus bradycardia 01/22/2012   Syncope    a. 11/2015 ? due to medications.   Syncope    reports on 05-10-17  i passed out 2 months ago in the bathroom and broke some ribs     Tachy-brady syndrome (HCC)    a. s/p MDT PPM 2013.   Tricuspid regurgitation    Patient Active Problem List   Diagnosis Date Noted   Sinus congestion 04/19/2023   Acute non-recurrent maxillary sinusitis 04/07/2023   Flu-like symptoms 04/07/2023   Lower respiratory infection 03/17/2022   Moderate episode of recurrent major depressive disorder (HCC) 10/07/2021   Lack of energy 10/07/2021   Compression fracture of T12 vertebra (HCC) 05/28/2021   Chronic neck pain 04/07/2021   Chronic bilateral low back pain without sciatica 04/07/2021   Exertional angina (HCC)    Renal artery stenosis (HCC) 06/26/2020   Cervical spine disease 05/30/2020   Statin intolerance 11/22/2019   Aortic atherosclerosis (HCC) 11/22/2019   Hypothyroidism (acquired) 02/08/2018   Coronary artery disease involving coronary bypass graft of native heart without angina pectoris 10/22/2017   Dyslipidemia 10/22/2017   Cerebrovascular small vessel disease 04/27/2017   Cerebrovascular disease 03/25/2017   Claudication in peripheral vascular disease (HCC) 02/15/2017   Hyperparathyroidism (HCC) 05/13/2016   Cardiac pacemaker in situ 01/10/2016   Benign neoplasm of ascending  colon    Long term current use of anticoagulant    CKD (chronic kidney disease), stage II    Sinus headache 12/04/2015   Chronic diastolic heart failure (HCC) 10/19/2015   Atypical atrial flutter (HCC)    Paroxysmal atrial flutter (HCC) 08/23/2015   Chronic fatigue 06/27/2015   Stented coronary artery    S/P CABG x 3 01/04/15 01/10/2015   CAD (coronary artery disease) 01/04/2015   PAF (paroxysmal atrial fibrillation) (HCC) 01/22/2012   Sinus node dysfunction (HCC) 01/22/2012   S/P placement of cardiac pacemaker, medtronic adapta 01/21/12  01/22/2012   PVD (peripheral vascular disease), hx stents to bil SFAs 02/2010 01/22/2012   Hyperlipidemia 12/31/2009   Anxiety 12/31/2009   DEPRESSION 12/31/2009   Essential hypertension 12/31/2009   Coronary atherosclerosis 12/31/2009   OSTEOARTHRITIS, HAND 12/31/2009   DISC DISEASE, CERVICAL 12/31/2009   DISC DISEASE, LUMBAR 12/31/2009   Home Medication(s) Prior to Admission medications   Medication Sig Start Date End Date Taking? Authorizing Provider  methocarbamol  (ROBAXIN ) 500 MG tablet Take 1-2 tablets (500-1,000 mg total) by mouth every 8 (eight) hours as needed for muscle spasms. 07/30/23  Yes Keirston Saephanh, Camila Cecil, MD  amLODipine  (NORVASC ) 5 MG tablet TAKE 1 TABLET (5 MG TOTAL) BY MOUTH IN THE MORNING AND AT BEDTIME 04/06/23   Croitoru, Mihai, MD  ascorbic acid  (VITAMIN C) 250 MG CHEW Chew 750 mg by mouth daily.    [provider]  aspirin  EC 81 MG tablet Take 81 mg by mouth daily.     [provider]  Biotin 54098 MCG TABS Take 10,000 mcg by mouth daily.    [provider]  carvedilol  (COREG ) 6.25 MG tablet Take 1 tablet (6.25 mg total) by mouth 2 (two) times daily with a meal. 04/06/23   Croitoru, Mihai, MD  Cholecalciferol  (VITAMIN D ) 50 MCG (2000 UT) tablet Take 2,000 Units by mouth daily.    [provider]  cilostazol  (PLETAL ) 50 MG tablet Take 1 tablet (50 mg total) by mouth 2 (two) times daily. 06/10/22   Avanell Leigh, MD  citalopram  (CELEXA ) 40 MG tablet TAKE 1 TABLET BY MOUTH EVERY DAY Patient not taking: Reported on 04/22/2023 04/01/23   Sagardia, Miguel Jose, MD  Cyanocobalamin  (B-12) 5000 MCG CAPS Take 10,000 mcg by mouth daily.    [provider]  diclofenac  (VOLTAREN ) 75 MG EC tablet Take 1 tablet (75 mg total) by mouth 2 (two) times daily. Patient not taking: Reported on 04/22/2023 04/06/22   Elvira Hammersmith, MD  dofetilide  (TIKOSYN ) 125 MCG capsule Take 1 capsule (125 mcg total) by mouth every 12 (twelve) hours. One  tablet twice a day 05/17/23   Croitoru, Mihai, MD  Evolocumab  (REPATHA  SURECLICK) 140 MG/ML SOAJ Inject 140 mg into the skin every 14 (fourteen) days. 04/26/23   Croitoru, Mihai, MD  ezetimibe  (ZETIA ) 10 MG tablet Take 1 tablet (10 mg total) by mouth daily. 04/06/23 07/05/23  Croitoru, Mihai, MD  loteprednol (LOTEMAX) 0.5 % ophthalmic suspension Place 2 drops into both eyes See admin instructions. Instill 2 drops into both eyes 2 times a day twice weekly    [provider]  Magnesium  200 MG TABS Take 200 mg by mouth daily.    [provider]  Multiple Vitamin (MULTIVITAMIN WITH MINERALS) TABS tablet Take 1 tablet by mouth 2 (two) times daily.    [provider]  Multiple Vitamins-Minerals (ZINC  PO) Take 150 mg by mouth daily.    [provider]  nitroGLYCERIN  (NITROSTAT )  0.4 MG SL tablet Place 1 tablet (0.4 mg total) under the tongue every 5 (five) minutes x 3 doses as needed for chest pain. Patient not taking: Reported on 04/22/2023 05/15/15   Ruddy Corral M, PA-C  oseltamivir  (TAMIFLU ) 30 MG capsule Take 1 capsule (30 mg total) by mouth 2 (two) times daily. Patient not taking: Reported on 04/22/2023 03/26/23   Coleman Daughters, MD  rosuvastatin  (CRESTOR ) 10 MG tablet Take one tablet by mouth daily. 07/15/23   Croitoru, Mihai, MD  VITAMIN D -VITAMIN K PO Take 1 capsule by mouth daily.    [provider]                                                                                                                                    Allergies Brilinta  [ticagrelor ], Atorvastatin , Clonidine derivatives, Crestor  [rosuvastatin  calcium ], Exforge [amlodipine  besylate-valsartan ], and Spironolactone   Review of Systems Review of Systems As noted in HPI  Physical Exam Vital Signs  I have reviewed the triage vital signs BP (!) 165/85   Pulse 77   Temp 97.9 F (36.6 C) (Oral)   Resp 20   SpO2 97%   Physical Exam Vitals reviewed.  Constitutional:       General: She is not in acute distress.    Appearance: She is well-developed. She is not diaphoretic.  HENT:     Head: Normocephalic and atraumatic.     Right Ear: External ear normal.     Left Ear: External ear normal.     Nose: Nose normal.   Eyes:     General: No scleral icterus.    Conjunctiva/sclera: Conjunctivae normal.   Neck:     Trachea: Phonation normal.    Cardiovascular:     Rate and Rhythm: Normal rate and regular rhythm.  Pulmonary:     Effort: Pulmonary effort is normal. No respiratory distress.     Breath sounds: No stridor.  Abdominal:     General: There is no distension.   Musculoskeletal:        General: Normal range of motion.     Cervical back: Normal range of motion. Torticollis present. Muscular tenderness present. No spinous process tenderness.  Lymphadenopathy:     Cervical: No cervical adenopathy.   Neurological:     Mental Status: She is alert and oriented to person, place, and time.   Psychiatric:        Behavior: Behavior normal.     ED Results and Treatments Labs (all labs ordered are listed, but only abnormal results are displayed) Labs Reviewed - No data to display  EKG  EKG Interpretation Date/Time:    Ventricular Rate:    PR Interval:    QRS Duration:    QT Interval:    QTC Calculation:   R Axis:      Text Interpretation:         Radiology No results found.  Medications Ordered in ED Medications  ketorolac (TORADOL) injection 30 mg (30 mg Intramuscular Given 07/30/23 0631)   Procedures Procedures  (including critical care time) Medical Decision Making / ED Course   Medical Decision Making Risk Prescription drug management.    Neck pain differential diagnosis considered. Consistent with acute torticollis.  Atraumatic.  No infectious etiology. Moving all extremities without focal deficits.  Doubt  acute vascular process.    Final Clinical Impression(s) / ED Diagnoses Final diagnoses:  Torticollis   The patient appears reasonably screened and/or stabilized for discharge and I doubt any other medical condition or other [facility] requiring further screening, evaluation, or treatment in the ED at this time. I have discussed the findings, Dx and Tx plan with the patient/family who expressed understanding and agree(s) with the plan. Discharge instructions discussed at length. The patient/family was given strict return precautions who verbalized understanding of the instructions. No further questions at time of discharge.  Disposition: Discharge  Condition: Good  ED Discharge Orders          Ordered    methocarbamol  (ROBAXIN ) 500 MG tablet  Every 8 hours PRN        07/30/23 0454             Follow Up: Sagardia, Miguel Jose, MD [address] [place] [address] [phone]  Call      This chart was dictated using voice recognition software.  Despite best efforts to proofread,  errors can occur which can change the documentation meaning.    Lindle Rhea, MD 07/30/23 [phone]

## 2023-08-01 DIAGNOSIS — M542 Cervicalgia: Secondary | ICD-10-CM | POA: Diagnosis not present

## 2023-08-03 DIAGNOSIS — M47812 Spondylosis without myelopathy or radiculopathy, cervical region: Secondary | ICD-10-CM | POA: Diagnosis not present

## 2023-08-04 DIAGNOSIS — M47892 Other spondylosis, cervical region: Secondary | ICD-10-CM | POA: Diagnosis not present

## 2023-08-16 DIAGNOSIS — M47892 Other spondylosis, cervical region: Secondary | ICD-10-CM | POA: Diagnosis not present

## 2023-08-19 DIAGNOSIS — M47892 Other spondylosis, cervical region: Secondary | ICD-10-CM | POA: Diagnosis not present

## 2023-09-01 DIAGNOSIS — M47892 Other spondylosis, cervical region: Secondary | ICD-10-CM | POA: Diagnosis not present

## 2023-09-02 ENCOUNTER — Other Ambulatory Visit: Payer: Self-pay | Admitting: Cardiovascular Disease

## 2023-09-02 ENCOUNTER — Ambulatory Visit (INDEPENDENT_AMBULATORY_CARE_PROVIDER_SITE_OTHER): Payer: Self-pay

## 2023-09-02 DIAGNOSIS — I495 Sick sinus syndrome: Secondary | ICD-10-CM

## 2023-09-03 LAB — CUP PACEART REMOTE DEVICE CHECK
Battery Impedance: 2424 Ohm
Battery Remaining Longevity: 28 mo
Battery Voltage: 2.74 V
Brady Statistic AP VP Percent: 0 %
Brady Statistic AP VS Percent: 99 %
Brady Statistic AS VP Percent: 0 %
Brady Statistic AS VS Percent: 1 %
Date Time Interrogation Session: 20250724105719
Implantable Lead Connection Status: 753985
Implantable Lead Connection Status: 753985
Implantable Lead Implant Date: 20131212
Implantable Lead Implant Date: 20131212
Implantable Lead Location: 753859
Implantable Lead Location: 753860
Implantable Lead Model: 5076
Implantable Lead Model: 5076
Implantable Pulse Generator Implant Date: 20131212
Lead Channel Impedance Value: 484 Ohm
Lead Channel Impedance Value: 736 Ohm
Lead Channel Pacing Threshold Amplitude: 0.75 V
Lead Channel Pacing Threshold Amplitude: 1.125 V
Lead Channel Pacing Threshold Pulse Width: 0.4 ms
Lead Channel Pacing Threshold Pulse Width: 0.4 ms
Lead Channel Setting Pacing Amplitude: 2.25 V
Lead Channel Setting Pacing Amplitude: 2.5 V
Lead Channel Setting Pacing Pulse Width: 0.4 ms
Lead Channel Setting Sensing Sensitivity: 4 mV
Zone Setting Status: 755011
Zone Setting Status: 755011

## 2023-09-13 ENCOUNTER — Ambulatory Visit: Payer: Self-pay | Admitting: Cardiovascular Disease

## 2023-09-16 DIAGNOSIS — N1832 Chronic kidney disease, stage 3b: Secondary | ICD-10-CM | POA: Diagnosis not present

## 2023-09-17 DIAGNOSIS — N1832 Chronic kidney disease, stage 3b: Secondary | ICD-10-CM | POA: Diagnosis not present

## 2023-09-23 ENCOUNTER — Encounter: Payer: Self-pay | Admitting: Cardiovascular Disease

## 2023-09-23 DIAGNOSIS — E213 Hyperparathyroidism, unspecified: Secondary | ICD-10-CM | POA: Diagnosis not present

## 2023-09-23 DIAGNOSIS — N1832 Chronic kidney disease, stage 3b: Secondary | ICD-10-CM | POA: Diagnosis not present

## 2023-09-23 DIAGNOSIS — I129 Hypertensive chronic kidney disease with stage 1 through stage 4 chronic kidney disease, or unspecified chronic kidney disease: Secondary | ICD-10-CM | POA: Diagnosis not present

## 2023-09-23 DIAGNOSIS — R809 Proteinuria, unspecified: Secondary | ICD-10-CM | POA: Diagnosis not present

## 2023-10-09 ENCOUNTER — Other Ambulatory Visit: Payer: Self-pay | Admitting: Emergency Medicine

## 2023-10-09 ENCOUNTER — Other Ambulatory Visit: Payer: Self-pay | Admitting: Cardiovascular Disease

## 2023-11-08 ENCOUNTER — Other Ambulatory Visit: Payer: Self-pay | Admitting: Cardiovascular Disease

## 2023-11-09 ENCOUNTER — Encounter: Payer: Self-pay | Admitting: Cardiovascular Disease

## 2023-11-09 DIAGNOSIS — R0602 Shortness of breath: Secondary | ICD-10-CM

## 2023-11-09 NOTE — Telephone Encounter (Signed)
 S/w the patient- informed her that Dr Francyne reviewed her information and doubts that the pain is coming from her pacemaker. What he is more concerned about is the possibility of a pulmonary embolism. Dr Francyne would like for the patient to come in tomorrow to get a D-Dimer and a Chest X-ray at [address] [facility]. She agreed to the plan of care.  Given ER precautions. She verbalized understanding of all information.

## 2023-11-09 NOTE — Telephone Encounter (Signed)
 Patient reports intermittent sharp, stabbing pain in left side of chest for the past week. She does also report some SOB with activity and feeling slowed down.   Patient is concerned that this may be coming from her pacemaker, a wire perhaps. She states she would like to see Dr. Francyne to discuss. Patient is due for her 6  month follow-up, however Dr. JAYSON is booked until December.  Will forward to Dr. Fanny nurse to see if she can be added in anywhere sooner.

## 2023-11-10 ENCOUNTER — Ambulatory Visit (HOSPITAL_COMMUNITY)
Admission: RE | Admit: 2023-11-10 | Discharge: 2023-11-10 | Disposition: A | Discharge status: Home / Self Care | Source: Ambulatory Visit | Attending: Cardiovascular Disease | Admitting: Cardiovascular Disease

## 2023-11-10 DIAGNOSIS — R0602 Shortness of breath: Secondary | ICD-10-CM | POA: Insufficient documentation

## 2023-11-10 DIAGNOSIS — R079 Chest pain, unspecified: Secondary | ICD-10-CM | POA: Diagnosis not present

## 2023-11-10 DIAGNOSIS — I700 Atherosclerosis of aorta: Secondary | ICD-10-CM | POA: Diagnosis not present

## 2023-11-10 DIAGNOSIS — Z950 Presence of cardiac pacemaker: Secondary | ICD-10-CM | POA: Diagnosis not present

## 2023-11-11 ENCOUNTER — Ambulatory Visit: Payer: Self-pay | Admitting: Cardiovascular Disease

## 2023-11-11 ENCOUNTER — Other Ambulatory Visit: Payer: Self-pay

## 2023-11-11 ENCOUNTER — Emergency Department (HOSPITAL_BASED_OUTPATIENT_CLINIC_OR_DEPARTMENT_OTHER)

## 2023-11-11 ENCOUNTER — Emergency Department (HOSPITAL_BASED_OUTPATIENT_CLINIC_OR_DEPARTMENT_OTHER)
Admission: EM | Admit: 2023-11-11 | Discharge: 2023-11-11 | Disposition: A | Discharge status: Home / Self Care | Attending: Emergency Medicine | Admitting: Emergency Medicine

## 2023-11-11 ENCOUNTER — Emergency Department (HOSPITAL_BASED_OUTPATIENT_CLINIC_OR_DEPARTMENT_OTHER): Admitting: Radiology

## 2023-11-11 ENCOUNTER — Encounter (HOSPITAL_BASED_OUTPATIENT_CLINIC_OR_DEPARTMENT_OTHER): Payer: Self-pay

## 2023-11-11 DIAGNOSIS — Z7982 Long term (current) use of aspirin: Secondary | ICD-10-CM | POA: Insufficient documentation

## 2023-11-11 DIAGNOSIS — I7121 Aneurysm of the ascending aorta, without rupture: Secondary | ICD-10-CM

## 2023-11-11 DIAGNOSIS — Z950 Presence of cardiac pacemaker: Secondary | ICD-10-CM | POA: Diagnosis not present

## 2023-11-11 DIAGNOSIS — R072 Precordial pain: Secondary | ICD-10-CM | POA: Diagnosis not present

## 2023-11-11 DIAGNOSIS — I2510 Atherosclerotic heart disease of native coronary artery without angina pectoris: Secondary | ICD-10-CM | POA: Diagnosis not present

## 2023-11-11 DIAGNOSIS — I700 Atherosclerosis of aorta: Secondary | ICD-10-CM | POA: Diagnosis not present

## 2023-11-11 DIAGNOSIS — R0789 Other chest pain: Secondary | ICD-10-CM | POA: Diagnosis present

## 2023-11-11 DIAGNOSIS — N182 Chronic kidney disease, stage 2 (mild): Secondary | ICD-10-CM | POA: Insufficient documentation

## 2023-11-11 LAB — D-DIMER, QUANTITATIVE: D-DIMER: 1.21 mg{FEU}/L — ABNORMAL HIGH (ref 0.00–0.49)

## 2023-11-11 LAB — TROPONIN T, HIGH SENSITIVITY
Troponin T High Sensitivity: <15 ng/L (ref 0–19)
Troponin T High Sensitivity: <15 ng/L (ref 0–19)

## 2023-11-11 LAB — BASIC METABOLIC PANEL WITH GFR
Anion gap: 13 (ref 5–15)
BUN: 16 mg/dL (ref 8–23)
CO2: 23 mmol/L (ref 22–32)
Calcium: 9.8 mg/dL (ref 8.9–10.3)
Chloride: 101 mmol/L (ref 98–111)
Creatinine, Ser: 1.27 mg/dL — ABNORMAL HIGH (ref 0.44–1.00)
GFR, Estimated: 42 mL/min — ABNORMAL LOW (ref >60)
Glucose, Bld: 109 mg/dL — ABNORMAL HIGH (ref 70–99)
Potassium: 4.8 mmol/L (ref 3.5–5.1)
Sodium: 136 mmol/L (ref 135–145)

## 2023-11-11 LAB — CBC
HCT: 41.8 % (ref 36.0–46.0)
Hemoglobin: 14.3 g/dL (ref 12.0–15.0)
MCH: 30.4 pg (ref 26.0–34.0)
MCHC: 34.2 g/dL (ref 30.0–36.0)
MCV: 88.9 fL (ref 80.0–100.0)
Platelets: 289 K/uL (ref 150–400)
RBC: 4.7 MIL/uL (ref 3.87–5.11)
RDW: 12.8 % (ref 11.5–15.5)
WBC: 8.1 K/uL (ref 4.0–10.5)
nRBC: 0 % (ref 0.0–0.2)

## 2023-11-11 MED ORDER — IOHEXOL 350 MG/ML SOLN
100.0000 mL | Freq: Once | INTRAVENOUS | Status: AC | PRN
  Administered 2023-11-11: 75 mL via INTRAVENOUS

## 2023-11-11 NOTE — ED Provider Notes (Addendum)
 [place] EMERGENCY DEPARTMENT AT [facility] Provider Note   CSN: [id number] Arrival date & time: 11/11/23  1544     Patient presents with: Chest Pain   [name] is a 83 y.o. female.   Patient sent in by her cardiologist he saw her yesterday she had a chest x-ray done had D-dimer done that was elevated the D-dimer was over 1.  So definitely positive for age correction.  Patient's had about a 2-week history of left anterior chest pain radiating to the back.  Denies any leg swelling.  No significant shortness of breath oxygen  saturations here is 97% on room air.  Patient has a pacemaker.  Patient is not on a blood thinner.  Past medical history significant for diverticulitis cervical and lumbar disc disease peripheral artery vascular disease history of a non-STEMI in 2017 persistent atrial fibrillation tricuspid regurgitation hyperlipidemia chronic kidney disease stage II.  Patient is on Tikosyn .       Prior to Admission medications   Medication Sig Start Date End Date Taking? Authorizing Provider  amLODipine  (NORVASC ) 5 MG tablet TAKE ONE TABLET BY MOUTH IN THE MORNING AND AT BEDTIME 11/09/23   Croitoru, Mihai, MD  ascorbic acid  (VITAMIN C) 250 MG CHEW Chew 750 mg by mouth daily.    [provider]  aspirin  EC 81 MG tablet Take 81 mg by mouth daily.     [provider]  Biotin 89999 MCG TABS Take 10,000 mcg by mouth daily.    [provider]  carvedilol  (COREG ) 6.25 MG tablet Take 1 tablet (6.25 mg total) by mouth 2 (two) times daily with a meal. 09/02/23   Croitoru, Mihai, MD  Cholecalciferol  (VITAMIN D ) 50 MCG (2000 UT) tablet Take 2,000 Units by mouth daily.    [provider]  cilostazol  (PLETAL ) 50 MG tablet Take 1 tablet (50 mg total) by mouth 2 (two) times daily. 06/10/22   Court Dorn PARAS, MD  citalopram  (CELEXA ) 40 MG tablet TAKE ONE TABLET EVERY DAY 10/09/23   Purcell Emil Schanz, MD  Cyanocobalamin  (B-12) 5000 MCG CAPS Take  10,000 mcg by mouth daily.    [provider]  diclofenac  (VOLTAREN ) 75 MG EC tablet Take 1 tablet (75 mg total) by mouth 2 (two) times daily. Patient not taking: Reported on 04/22/2023 04/06/22   Purcell Emil Schanz, MD  dofetilide  (TIKOSYN ) 125 MCG capsule Take 1 capsule (125 mcg total) by mouth every 12 (twelve) hours. One tablet twice a day 05/17/23   Croitoru, Mihai, MD  Evolocumab  (REPATHA  SURECLICK) 140 MG/ML SOAJ Inject 140 mg into the skin every 14 (fourteen) days. 04/26/23   Croitoru, Mihai, MD  ezetimibe  (ZETIA ) 10 MG tablet Take 1 tablet (10 mg total) by mouth daily. 04/06/23 07/05/23  Croitoru, Mihai, MD  loteprednol (LOTEMAX) 0.5 % ophthalmic suspension Place 2 drops into both eyes See admin instructions. Instill 2 drops into both eyes 2 times a day twice weekly    [provider]  Magnesium  200 MG TABS Take 200 mg by mouth daily.    [provider]  methocarbamol  (ROBAXIN ) 500 MG tablet Take 1-2 tablets (500-1,000 mg total) by mouth every 8 (eight) hours as needed for muscle spasms. 07/30/23   Trine Raynell Moder, MD  Multiple Vitamin (MULTIVITAMIN WITH MINERALS) TABS tablet Take 1 tablet by mouth 2 (two) times daily.    [provider]  Multiple Vitamins-Minerals (ZINC  PO) Take 150 mg by mouth daily.    [provider]  nitroGLYCERIN  (NITROSTAT )  0.4 MG SL tablet Place 1 tablet (0.4 mg total) under the tongue every 5 (five) minutes x 3 doses as needed for chest pain. Patient not taking: Reported on 04/22/2023 05/15/15   Marcine Catalan M, PA-C  oseltamivir  (TAMIFLU ) 30 MG capsule Take 1 capsule (30 mg total) by mouth 2 (two) times daily. Patient not taking: Reported on 04/22/2023 03/26/23   Nicholaus Cassondra DEL, MD  rosuvastatin  (CRESTOR ) 10 MG tablet Take one tablet by mouth daily. 10/12/23   Croitoru, Mihai, MD  VITAMIN D -VITAMIN K PO Take 1 capsule by mouth daily.    [provider]    Allergies: Brilinta  [ticagrelor ], Atorvastatin ,  Clonidine derivatives, Crestor  [rosuvastatin  calcium ], Exforge [amlodipine  besylate-valsartan ], and Spironolactone     Review of Systems  Constitutional:  Negative for chills and fever.  HENT:  Negative for ear pain and sore throat.   Eyes:  Negative for pain and visual disturbance.  Respiratory:  Negative for cough and shortness of breath.   Cardiovascular:  Positive for chest pain. Negative for palpitations.  Gastrointestinal:  Negative for abdominal pain and vomiting.  Genitourinary:  Negative for dysuria and hematuria.  Musculoskeletal:  Negative for arthralgias and back pain.  Skin:  Negative for color change and rash.  Neurological:  Negative for seizures and syncope.  All other systems reviewed and are negative.   Updated Vital Signs BP (!) 145/86 (BP Location: Right Arm)   Pulse 82   Temp 98.9 F (37.2 C)   Resp 16   Ht 1.6 m (5' 3)   Wt 64 kg   SpO2 97%   BMI 24.98 kg/m   Physical Exam Vitals and nursing note reviewed.  Constitutional:      General: She is not in acute distress.    Appearance: Normal appearance. She is well-developed.  HENT:     Head: Normocephalic and atraumatic.  Eyes:     Extraocular Movements: Extraocular movements intact.     Conjunctiva/sclera: Conjunctivae normal.     Pupils: Pupils are equal, round, and reactive to light.  Cardiovascular:     Rate and Rhythm: Normal rate and regular rhythm.     Heart sounds: No murmur heard. Pulmonary:     Effort: Pulmonary effort is normal. No respiratory distress.     Breath sounds: Normal breath sounds.  Abdominal:     Palpations: Abdomen is soft.     Tenderness: There is no abdominal tenderness.  Musculoskeletal:        General: No swelling.     Cervical back: Normal range of motion and neck supple.     Right lower leg: No edema.     Left lower leg: No edema.  Skin:    General: Skin is warm and dry.     Capillary Refill: Capillary refill takes less than 2 seconds.  Neurological:      General: No focal deficit present.     Mental Status: She is alert and oriented to person, place, and time.  Psychiatric:        Mood and Affect: Mood normal.     (all labs ordered are listed, but only abnormal results are displayed) Labs Reviewed  BASIC METABOLIC PANEL WITH GFR - Abnormal; Notable for the following components:      Result Value   Glucose, Bld 109 (*)    Creatinine, Ser 1.27 (*)    GFR, Estimated 42 (*)    All other components within normal limits  CBC  TROPONIN T, HIGH SENSITIVITY  TROPONIN T, HIGH  SENSITIVITY    EKG: EKG Interpretation Date/Time:  Thursday November 11 2023 15:54:19 EDT Ventricular Rate:  79 PR Interval:  204 QRS Duration:  84 QT Interval:  432 QTC Calculation: 495 R Axis:   71  Text Interpretation: Atrial-paced rhythm Septal infarct , age undetermined Abnormal ECG When compared with ECG of 25-Mar-2023 20:53, No significant change was found Confirmed by Shey Yott [phone]) on 11/11/2023 4:18:16 PM  Radiology: No results found.   Procedures   Medications Ordered in the ED - No data to display                                  Medical Decision Making Amount and/or Complexity of Data Reviewed Labs: ordered. Radiology: ordered.  Risk Prescription drug management.   Patient had chest x-ray done yesterday not officially reviewed yet by radiology.  But nothing acute based on my review.  Patient metabolic panel significant for GFR 42.  Electrolytes otherwise normal.  CBC white count 8.1 hemoglobin 14.3 platelets 289.  Initial troponin was less than 15.  Based on the duration of the chest pain 1 troponin is adequate. EKG consistent atrial paced rhythm.  Will order the CT angio to rule out pulmonary embolus.  Delta troponin although not absolutely needed is in process.  CT angio shows evidence of a ascending thoracic aorta 4.4 cm recommend annual imaging follow-up based on that.  She can follow back up with her cardiologist for  that.  Supraumbilical ventral wall hernias containing fat.  Aortic's atherosclerosis and cardiomegaly.  No evidence of pulmonary embolus.  Chart review shows that the last CT chest she had it was 3.9 in 2021.  Will close follow-up with her should be important.   Delta troponin is less than 15 so no concerns there.  Patient stable for discharge home and close follow-up with her cardiologist.   Final diagnoses:  Precordial pain    ED Discharge Orders     None          Geraldene Hamilton, MD 11/11/23 1738    Geraldene Hamilton, MD 11/11/23 8096    Geraldene Hamilton, MD 11/11/23 [phone]

## 2023-11-11 NOTE — ED Triage Notes (Signed)
 Pt reports L sided chest pain x1-2 weeks radiating to back. Pt reports that PCP told her to come to ED for rule out PE.

## 2023-11-11 NOTE — Discharge Instructions (Signed)
 Follow back up with your cardiologist.  Troponins x 2 both very normal no evidence of any acute cardiac event.  CT angio chest no pulmonary embolism.  But does show that your ascending thoracic aneurysm that was present in 2021 is now 4.4 cm and in 2021 it was 3.9 cm.  Close follow-up with your cardiologist is important.  Return for any new or worse symptoms.

## 2023-11-11 NOTE — Progress Notes (Signed)
 Remote PPM Transmission

## 2023-11-12 NOTE — Telephone Encounter (Signed)
 Croitoru, Jerel, MD to Purcell Emil Schanz, MD  Davee Comer CROME, RN  (Selected Message)    11/11/23  3:07 PM Result Note I discussed the findings of the chest x-ray and the D-dimer results with Mrs. Mccarver and advised that the safest approach is to go to an emergency center and have labs and CT angiogram of the chest for possible pulmonary embolism.  We spoke on the phone.

## 2023-11-22 ENCOUNTER — Ambulatory Visit: Admitting: Emergency Medicine

## 2023-11-22 ENCOUNTER — Encounter: Payer: Self-pay | Admitting: Emergency Medicine

## 2023-11-22 VITALS — BP 106/70 | HR 86 | Temp 99.2°F | Ht 63.0 in | Wt 142.0 lb

## 2023-11-22 DIAGNOSIS — I480 Paroxysmal atrial fibrillation: Secondary | ICD-10-CM

## 2023-11-22 DIAGNOSIS — D45 Polycythemia vera: Secondary | ICD-10-CM | POA: Insufficient documentation

## 2023-11-22 DIAGNOSIS — I5032 Chronic diastolic (congestive) heart failure: Secondary | ICD-10-CM

## 2023-11-22 DIAGNOSIS — I25810 Atherosclerosis of coronary artery bypass graft(s) without angina pectoris: Secondary | ICD-10-CM

## 2023-11-22 DIAGNOSIS — I700 Atherosclerosis of aorta: Secondary | ICD-10-CM | POA: Diagnosis not present

## 2023-11-22 DIAGNOSIS — I77810 Thoracic aortic ectasia: Secondary | ICD-10-CM

## 2023-11-22 DIAGNOSIS — N182 Chronic kidney disease, stage 2 (mild): Secondary | ICD-10-CM | POA: Diagnosis not present

## 2023-11-22 DIAGNOSIS — M549 Dorsalgia, unspecified: Secondary | ICD-10-CM | POA: Diagnosis not present

## 2023-11-22 DIAGNOSIS — Z09 Encounter for follow-up examination after completed treatment for conditions other than malignant neoplasm: Secondary | ICD-10-CM

## 2023-11-22 DIAGNOSIS — N1832 Chronic kidney disease, stage 3b: Secondary | ICD-10-CM

## 2023-11-22 DIAGNOSIS — F331 Major depressive disorder, recurrent, moderate: Secondary | ICD-10-CM

## 2023-11-22 DIAGNOSIS — I10 Essential (primary) hypertension: Secondary | ICD-10-CM

## 2023-11-22 DIAGNOSIS — E785 Hyperlipidemia, unspecified: Secondary | ICD-10-CM

## 2023-11-22 MED ORDER — METHOCARBAMOL 500 MG PO TABS: 500.0000 mg | ORAL_TABLET | Freq: Three times a day (TID) | ORAL | 1 refills | Status: AC | PRN

## 2023-11-22 MED ORDER — VENLAFAXINE HCL ER 37.5 MG PO CP24
37.5000 mg | ORAL_CAPSULE | Freq: Every day | ORAL | 3 refills | Status: AC
Start: 2023-11-22 — End: ?

## 2023-11-22 NOTE — Assessment & Plan Note (Signed)
Stable.  No recent anginal episodes Continue baby daily aspirin.

## 2023-11-22 NOTE — Assessment & Plan Note (Signed)
 Diet and nutrition discussed. Continue rosuvastatin 10 mg daily.

## 2023-11-22 NOTE — Assessment & Plan Note (Signed)
Presently in sinus rhythm. Continue Tikosyn 125 mg twice a day for rhythm control. Continue Plavix 75 mg daily.

## 2023-11-22 NOTE — Assessment & Plan Note (Signed)
Stable without recent anginal episodes

## 2023-11-22 NOTE — Assessment & Plan Note (Signed)
 Currently active and affecting quality of life. Evaluated by psychiatrist in the recent past Failed Celexa  and Wellbutrin  Patient willing to try Effexor We will start at 37.5 mg daily

## 2023-11-22 NOTE — Assessment & Plan Note (Signed)
Chronic stable condition Continues rosuvastatin 10 mg daily

## 2023-11-22 NOTE — Assessment & Plan Note (Signed)
 Clinically stable Blood pressure well-controlled No findings of acute CHF

## 2023-11-22 NOTE — Patient Instructions (Signed)
 Health Maintenance After Age 83 After age 27, you are at a higher risk for certain long-term diseases and infections as well as injuries from falls. Falls are a major cause of broken bones and head injuries in people who are older than age 73. Getting regular preventive care can help to keep you healthy and well. Preventive care includes getting regular testing and making lifestyle changes as recommended by your health care provider. Talk with your health care provider about: Which screenings and tests you should have. A screening is a test that checks for a disease when you have no symptoms. A diet and exercise plan that is right for you. What should I know about screenings and tests to prevent falls? Screening and testing are the best ways to find a health problem early. Early diagnosis and treatment give you the best chance of managing medical conditions that are common after age 90. Certain conditions and lifestyle choices may make you more likely to have a fall. Your health care provider may recommend: Regular vision checks. Poor vision and conditions such as cataracts can make you more likely to have a fall. If you wear glasses, make sure to get your prescription updated if your vision changes. Medicine review. Work with your health care provider to regularly review all of the medicines you are taking, including over-the-counter medicines. Ask your health care provider about any side effects that may make you more likely to have a fall. Tell your health care provider if any medicines that you take make you feel dizzy or sleepy. Strength and balance checks. Your health care provider may recommend certain tests to check your strength and balance while standing, walking, or changing positions. Foot health exam. Foot pain and numbness, as well as not wearing proper footwear, can make you more likely to have a fall. Screenings, including: Osteoporosis screening. Osteoporosis is a condition that causes  the bones to get weaker and break more easily. Blood pressure screening. Blood pressure changes and medicines to control blood pressure can make you feel dizzy. Depression screening. You may be more likely to have a fall if you have a fear of falling, feel depressed, or feel unable to do activities that you used to do. Alcohol  use screening. Using too much alcohol  can affect your balance and may make you more likely to have a fall. Follow these instructions at home: Lifestyle Do not drink alcohol  if: Your health care provider tells you not to drink. If you drink alcohol : Limit how much you have to: 0-1 drink a day for women. 0-2 drinks a day for men. Know how much alcohol  is in your drink. In the [place] one drink equals one 12 oz bottle of beer (355 mL), one 5 oz glass of wine (148 mL), or one 1 oz glass of hard liquor (44 mL). Do not use any products that contain nicotine or tobacco. These products include cigarettes, chewing tobacco, and vaping devices, such as e-cigarettes. If you need help quitting, ask your health care provider. Activity  Follow a regular exercise program to stay fit. This will help you maintain your balance. Ask your health care provider what types of exercise are appropriate for you. If you need a cane or walker, use it as recommended by your health care provider. Wear supportive shoes that have nonskid soles. Safety  Remove any tripping hazards, such as rugs, cords, and clutter. Install safety equipment such as grab bars in bathrooms and safety rails on stairs. Keep rooms and walkways  well-lit. General instructions Talk with your health care provider about your risks for falling. Tell your health care provider if: You fall. Be sure to tell your health care provider about all falls, even ones that seem minor. You feel dizzy, tiredness (fatigue), or off-balance. Take over-the-counter and prescription medicines only as told by your health care provider. These include  supplements. Eat a healthy diet and maintain a healthy weight. A healthy diet includes low-fat dairy products, low-fat (lean) meats, and fiber from whole grains, beans, and lots of fruits and vegetables. Stay current with your vaccines. Schedule regular health, dental, and eye exams. Summary Having a healthy lifestyle and getting preventive care can help to protect your health and wellness after age 15. Screening and testing are the best way to find a health problem early and help you avoid having a fall. Early diagnosis and treatment give you the best chance for managing medical conditions that are more common for people who are older than age 42. Falls are a major cause of broken bones and head injuries in people who are older than age 64. Take precautions to prevent a fall at home. Work with your health care provider to learn what changes you can make to improve your health and wellness and to prevent falls. This information is not intended to replace advice given to you by your health care provider. Make sure you discuss any questions you have with your health care provider. Document Revised: 06/17/2020 Document Reviewed: 06/17/2020 [employer] Patient Education  2024 [employer].

## 2023-11-22 NOTE — Progress Notes (Signed)
 [name] 83 y.o.   Chief Complaint  Patient presents with   Hospitalization Follow-up    Patient here for HFU, back pain, and med refill for previous medications that was given for her back pain. Patient mentions she's been having more depression and wants to discuss this     HISTORY OF PRESENT ILLNESS: This is a 83 y.o. female here for follow-up of emergency department visit on 11/11/2023 when she presented complaining of chest pain CTA of chest showed 4.4 ascending thoracic aneurysm.  Chest pains have subsided for the most part Also complaining of occasional back pain.  Robaxin  works.  Needs refill on prescription Also still complaining of depression.  Saw psychiatrist about 2 years ago who started her on 2 medications that made her worse States that she has a friend taking Effexor which works well for her.  Willing to try it. No other complaints or medical concerns today.  Recent emergency department visit assessment and plan as follows:  Patient had chest x-ray done yesterday not officially reviewed yet by radiology.  But nothing acute based on my review.   Patient metabolic panel significant for GFR 42.  Electrolytes otherwise normal.  CBC white count 8.1 hemoglobin 14.3 platelets 289.  Initial troponin was less than 15.  Based on the duration of the chest pain 1 troponin is adequate. EKG consistent atrial paced rhythm.   Will order the CT angio to rule out pulmonary embolus.   Delta troponin although not absolutely needed is in process.   CT angio shows evidence of a ascending thoracic aorta 4.4 cm recommend annual imaging follow-up based on that.  She can follow back up with her cardiologist for that.  Supraumbilical ventral wall hernias containing fat.  Aortic's atherosclerosis and cardiomegaly.  No evidence of pulmonary embolus.  Chart review shows that the last CT chest she had it was 3.9 in 2021.  Will close follow-up with her should be important.    Delta troponin is  less than 15 so no concerns there.  Patient stable for discharge home and close follow-up with her cardiologist.     Final diagnoses:  Precordial pain   HPI   Prior to Admission medications   Medication Sig Start Date End Date Taking? Authorizing Provider  amLODipine  (NORVASC ) 5 MG tablet TAKE ONE TABLET BY MOUTH IN THE MORNING AND AT BEDTIME 11/09/23  Yes Croitoru, Mihai, MD  ascorbic acid  (VITAMIN C) 250 MG CHEW Chew 750 mg by mouth daily.   Yes [provider]  aspirin  EC 81 MG tablet Take 81 mg by mouth daily.    Yes [provider]  Biotin 89999 MCG TABS Take 10,000 mcg by mouth daily.   Yes [provider]  carvedilol  (COREG ) 6.25 MG tablet Take 1 tablet (6.25 mg total) by mouth 2 (two) times daily with a meal. 09/02/23  Yes Croitoru, Mihai, MD  citalopram  (CELEXA ) 40 MG tablet TAKE ONE TABLET EVERY DAY 10/09/23  Yes Daveda Larock, Emil Schanz, MD  dofetilide  (TIKOSYN ) 125 MCG capsule Take 1 capsule (125 mcg total) by mouth every 12 (twelve) hours. One tablet twice a day 05/17/23  Yes Croitoru, Mihai, MD  loteprednol (LOTEMAX) 0.5 % ophthalmic suspension Place 2 drops into both eyes See admin instructions. Instill 2 drops into both eyes 2 times a day twice weekly   Yes [provider]  Magnesium  200 MG TABS Take 200 mg by mouth daily.   Yes [provider]  methocarbamol  (ROBAXIN ) 500 MG tablet Take  1-2 tablets (500-1,000 mg total) by mouth every 8 (eight) hours as needed for muscle spasms. 07/30/23  Yes Cardama, Raynell Moder, MD  Multiple Vitamin (MULTIVITAMIN WITH MINERALS) TABS tablet Take 1 tablet by mouth 2 (two) times daily.   Yes [provider]  Multiple Vitamins-Minerals (ZINC  PO) Take 150 mg by mouth daily.   Yes [provider]  rosuvastatin  (CRESTOR ) 10 MG tablet Take one tablet by mouth daily. 10/12/23  Yes Croitoru, Mihai, MD  VITAMIN D -VITAMIN K PO Take 1 capsule by mouth daily.   Yes [provider]   Cholecalciferol  (VITAMIN D ) 50 MCG (2000 UT) tablet Take 2,000 Units by mouth daily.    [provider]  cilostazol  (PLETAL ) 50 MG tablet Take 1 tablet (50 mg total) by mouth 2 (two) times daily. 06/10/22   Court Dorn PARAS, MD  Cyanocobalamin  (B-12) 5000 MCG CAPS Take 10,000 mcg by mouth daily.    [provider]  diclofenac  (VOLTAREN ) 75 MG EC tablet Take 1 tablet (75 mg total) by mouth 2 (two) times daily. Patient not taking: Reported on 04/22/2023 04/06/22   Tiffane Sheldon Jose, MD  Evolocumab  (REPATHA  SURECLICK) 140 MG/ML SOAJ Inject 140 mg into the skin every 14 (fourteen) days. 04/26/23   Croitoru, Mihai, MD  ezetimibe  (ZETIA ) 10 MG tablet Take 1 tablet (10 mg total) by mouth daily. 04/06/23 07/05/23  Croitoru, Jerel, MD  nitroGLYCERIN  (NITROSTAT ) 0.4 MG SL tablet Place 1 tablet (0.4 mg total) under the tongue every 5 (five) minutes x 3 doses as needed for chest pain. Patient not taking: Reported on 11/22/2023 05/15/15   Marcine Catalan M, PA-C  oseltamivir  (TAMIFLU ) 30 MG capsule Take 1 capsule (30 mg total) by mouth 2 (two) times daily. Patient not taking: Reported on 04/22/2023 03/26/23   Nicholaus Cassondra DEL, MD    Allergies  Allergen Reactions   Brilinta  [Ticagrelor ] Shortness Of Breath   Atorvastatin  Other (See Comments)    Possible cause of fatigue/malaise   Clonidine Derivatives Other (See Comments)    Lowers heart rate   Crestor  [Rosuvastatin  Calcium ] Other (See Comments)    Joint/muscle aches   Exforge [Amlodipine  Besylate-Valsartan ] Itching and Rash   Spironolactone  Other (See Comments)    Contraindicated with history of hyperkalemia    Patient Active Problem List   Diagnosis Date Noted   Sinus congestion 04/19/2023   Acute non-recurrent maxillary sinusitis 04/07/2023   Flu-like symptoms 04/07/2023   Lower respiratory infection 03/17/2022   Moderate episode of recurrent major depressive disorder (HCC) 10/07/2021   Lack of energy 10/07/2021    Compression fracture of T12 vertebra (HCC) 05/28/2021   Chronic neck pain 04/07/2021   Chronic bilateral low back pain without sciatica 04/07/2021   Exertional angina    Renal artery stenosis 06/26/2020   Cervical spine disease 05/30/2020   Statin intolerance 11/22/2019   Aortic atherosclerosis 11/22/2019   Hypothyroidism (acquired) 02/08/2018   Coronary artery disease involving coronary bypass graft of native heart without angina pectoris 10/22/2017   Dyslipidemia 10/22/2017   Cerebrovascular small vessel disease 04/27/2017   Cerebrovascular disease 03/25/2017   Claudication in peripheral vascular disease 02/15/2017   Hyperparathyroidism 05/13/2016   Cardiac pacemaker in situ 01/10/2016   Benign neoplasm of ascending colon    Long term current use of anticoagulant    CKD (chronic kidney disease), stage II    Sinus headache 12/04/2015   Chronic diastolic heart failure (HCC) 10/19/2015   Atypical atrial flutter (HCC)    Paroxysmal atrial flutter (HCC) 08/23/2015  Chronic fatigue 06/27/2015   Stented coronary artery    S/P CABG x 3 01/04/15 01/10/2015   CAD (coronary artery disease) 01/04/2015   PAF (paroxysmal atrial fibrillation) (HCC) 01/22/2012   Sinus node dysfunction (HCC) 01/22/2012   S/P placement of cardiac pacemaker, medtronic adapta 01/21/12 01/22/2012   PVD (peripheral vascular disease), hx stents to bil SFAs 02/2010 01/22/2012   Hyperlipidemia 12/31/2009   Anxiety 12/31/2009   DEPRESSION 12/31/2009   Essential hypertension 12/31/2009   Coronary atherosclerosis 12/31/2009   OSTEOARTHRITIS, HAND 12/31/2009   DISC DISEASE, CERVICAL 12/31/2009   DISC DISEASE, LUMBAR 12/31/2009    Past Medical History:  Diagnosis Date   ANXIETY 12/31/2009   Aortic insufficiency    a. mod by echo 2017.   CAD (coronary artery disease)    a. s/p CABGx3 and LAA clipping in 12/2014, NSTEMI 05/2015 s/p DES to native RCA and SVG-dRCA; occluded ramus-SVG was treated medically). c. neg nuc  10/2015 at [facility]   Chronic diastolic CHF (congestive heart failure) (HCC)    Chronic fatigue    CKD (chronic kidney disease), stage II    DEPRESSION 12/31/2009   DISC DISEASE, CERVICAL 12/31/2009   DISC DISEASE, LUMBAR 12/31/2009   DIVERTICULITIS, HX OF 12/31/2009   Essential hypertension    Gait abnormality 03/25/2017   GLUCOSE INTOLERANCE 12/31/2009   Habitual alcohol use    History of stroke    self reports  they say i have had some pin strokes    Hypercalcemia    Hyperkalemia    Hyperlipidemia 12/31/2009   Hypothyroidism    MENOPAUSE, EARLY 12/31/2009   NSTEMI (non-ST elevated myocardial infarction) (HCC) 05/11/2015   Orthostatic hypotension    OSTEOARTHRITIS, HAND    Pacemaker 01/21/2012   MDT Adapta dual chamber   Paroxysmal atrial flutter (HCC)    Persistent atrial fibrillation (HCC)    PVD (peripheral vascular disease), hx stents to bil SFAs 02/2010    Rash, skin     superficial raised red pencil point sized rash bilateral forearms; states  it started when i started the Plavix      Ruptured left breast implant    Symptomatic sinus bradycardia 01/22/2012   Syncope    a. 11/2015 ? due to medications.   Syncope    reports on 05-10-17  i passed out 2 months ago in the bathroom and broke some ribs     Tachy-brady syndrome (HCC)    a. s/p MDT PPM 2013.   Tricuspid regurgitation     Past Surgical History:  Procedure Laterality Date   ABDOMINAL AORTOGRAM W/LOWER EXTREMITY Bilateral 02/15/2017   Procedure: ABDOMINAL AORTOGRAM W/LOWER EXTREMITY;  Surgeon: Court Dorn PARAS, MD;  Location: [facility];  Service: Cardiovascular;  Laterality: Bilateral;  bilat   ABDOMINAL AORTOGRAM W/LOWER EXTREMITY N/A 10/06/2018   Procedure: ABDOMINAL AORTOGRAM W/LOWER EXTREMITY;  Surgeon: Court Dorn PARAS, MD;  Location: [facility];  Service: Cardiovascular;  Laterality: N/A;   Ablation of typical atrial flutter (CTI line)  09/05/2016   Dr Duwayne at [place]    AUGMENTATION  MAMMAPLASTY  1983   Removed 01/2018   BREAST IMPLANT REMOVAL Bilateral 01/14/2018   Procedure: REMOVAL BILATERAL BREAST IMPLANTS;  Surgeon: Arelia Filippo, MD;  Location: [facility];  Service: Plastics;  Laterality: Bilateral;   CAPSULECTOMY Bilateral 01/14/2018   Procedure: CAPSULECTOMY;  Surgeon: Arelia Filippo, MD;  Location: [facility];  Service: Plastics;  Laterality: Bilateral;   CARDIAC CATHETERIZATION N/A 01/01/2015   Procedure: Left  Heart Cath and Coronary Angiography;  Surgeon: Candyce GORMAN Reek, MD;  Location[facility]VAS[facility] CV LAB;  Service: Cardiovascular;  Laterality: N/A;   CARDIAC CATHETERIZATION N/A 05/12/2015   Procedure: Left Heart Cath and Coronary Angiography;  Surgeon: Debby DELENA Sor, MD;  Location: [facility];  Service: Cardiovascular;  Laterality: N/A;   CARDIAC CATHETERIZATION N/A 05/12/2015   Procedure: Coronary Stent Intervention;  Surgeon: Debby DELENA Sor, MD;  Location: [facility];  Service: Cardiovascular;  Laterality: N/A;   CARDIOVERSION N/A 02/01/2015   Procedure: CARDIOVERSION;  Surgeon: Redell GORMAN Shallow, MD;  Location: [facility]COPY;  Service: Cardiovascular;  Laterality: N/A;   CARDIOVERSION N/A 08/30/2015   Procedure: CARDIOVERSION;  Surgeon: Jerel Balding, MD;  Location: [facility];  Service: Cardiovascular;  Laterality: N/A;   CARPAL TUNNEL RELEASE  2008   right hand/thumb; carpal tunnel repair; got rid of arthritis (01/21/2012)   CLIPPING OF ATRIAL APPENDAGE N/A 01/04/2015   Procedure: CLIPPING OF ATRIAL APPENDAGE;  Surgeon: Dallas KATHEE Jude, MD;  Location: [facility] OR;  Service: Open Heart Surgery;  Laterality: N/A;   COLONOSCOPY N/A 12/21/2015   Procedure: COLONOSCOPY;  Surgeon: Toribio SHAUNNA Cedar, MD;  Location: [facility]OS[facility];  Service: Endoscopy;  Laterality: N/A;   CORONARY ARTERY BYPASS GRAFT N/A 01/04/2015   Procedure: CORONARY ARTERY BYPASS GRAFTING (CABG) x 3 using left internal mammory artery and greater  saphenous vein right leg harvested endoscopically.;  Surgeon: Dallas KATHEE Jude, MD;  LIMA-LAD, SVG-RI, SVG-PDA   CORONARY STENT INTERVENTION N/A 11/07/2020   Procedure: CORONARY STENT INTERVENTION;  Surgeon: Thukkani, Arun K, MD;  [facility];  Service: Cardiovascular;  Laterality: N/A;   ESOPHAGOGASTRODUODENOSCOPY (EGD) WITH PROPOFOL  N/A 12/18/2015   Procedure: ESOPHAGOGASTRODUODENOSCOPY (EGD) WITH PROPOFOL ;  Surgeon: Toribio SHAUNNA Cedar, MD;  Location: [facility];  Service: Endoscopy;  Laterality: N/A;   FACELIFT, LOWER 2/3  1995   mini (01/21/2012)   GIVENS CAPSULE STUDY N/A 12/21/2015   Procedure: GIVENS CAPSULE STUDY;  Surgeon: Toribio SHAUNNA Cedar, MD;  Location: [facility];  Service: Endoscopy;  Laterality: N/A;   LEFT HEART CATH AND CORS/GRAFTS ANGIOGRAPHY N/A 11/07/2020   Procedure: LEFT HEART CATH AND CORS/GRAFTS ANGIOGRAPHY;  Surgeon: Wendel Lurena POUR, MD;  Location: [facility];  Service: Cardiovascular;  Laterality: N/A;   Lower Arterial Examination  10/28/2011   R. SFA stent mild-moderate mixed density plaque with elevated velocities consistent with 50% diameter reduction. L. SFA stent moderate mixed denisty plaque at mid to distal level consistent with 50-69% diameter reduction.   LOWER EXTREMITY ANGIOGRAPHY N/A 02/15/2017   Procedure: LOWER EXTREMITY ANGIOGRAPHY;  Surgeon: Court Dorn PARAS, MD;  [facility];  Service: Cardiovascular;  Laterality: N/A;   OOPHORECTOMY  ~1979   PARATHYROIDECTOMY N/A 05/13/2017   Procedure: PARATHYROIDECTOMY;  Surgeon: Eletha Boas, MD;  Location: [facility];  Service: General;  Laterality: N/A;   PARTIAL COLECTOMY  2010   PERIPHERAL ARTERIAL STENT GRAFT  2012; 2012   LLE; RLE (01/21/2012)   PERIPHERAL VASCULAR BALLOON ANGIOPLASTY Right 02/15/2017   Procedure: PERIPHERAL VASCULAR BALLOON ANGIOPLASTY;  Surgeon: Court Dorn PARAS, MD;  Location: [facility];  Service: Cardiovascular;  Laterality: Right;  SFA      PERIPHERAL VASCULAR INTERVENTION Right 10/06/2018   Procedure: PERIPHERAL VASCULAR INTERVENTION;  Surgeon: Court Dorn PARAS, MD;  Location: [facility];  Service: Cardiovascular;  Laterality: Right;   PERMANENT PACEMAKER INSERTION N/A 01/21/2012   Medtronic Adapta L implanted by Dr Balding for  tachy/brady syndrome   POSTERIOR CERVICAL LAMINECTOMY  1985   TEE WITHOUT CARDIOVERSION N/A 01/04/2015   Procedure: TRANSESOPHAGEAL ECHOCARDIOGRAM (TEE);  Surgeon: Dallas KATHEE Jude, MD;  Location: [facility] OR;  Service: Open Heart Surgery;  Laterality: N/A;   TEE WITHOUT CARDIOVERSION N/A 02/01/2015   Procedure: TRANSESOPHAGEAL ECHOCARDIOGRAM (TEE);  Surgeon: Redell GORMAN Shallow, MD;  Location: [facility] ENDOSCOPY;  Service: Cardiovascular;  Laterality: N/A;   TEE WITHOUT CARDIOVERSION N/A 08/30/2015   Procedure: TRANSESOPHAGEAL ECHOCARDIOGRAM (TEE);  Surgeon: Jerel Balding, MD;  Location: [facility] ENDOSCOPY;  Service: Cardiovascular;  Laterality: N/A;   VAGINAL HYSTERECTOMY  1975    Social History   Socioeconomic History   Marital status: Widowed    Spouse name: Not on file   Number of children: 3   Years of education: Not on file   Highest education level: Not on file  Occupational History   Occupation: Retired Advertising account executive: RETIRED  Tobacco Use   Smoking status: Former    Current packs/day: 0.00    Average packs/day: 0.8 packs/day for 10.0 years (7.5 ttl pk-yrs)    Types: Cigarettes    Start date: 25    Quit date: 2008    Years since quitting: 17.7   Smokeless tobacco: Never   Tobacco comments:    01/21/2012 quit smoking ~ 2002  Vaping Use   Vaping status: Never Used  Substance and Sexual Activity   Alcohol use: Yes    Comment: daily coctail   Drug use: No   Sexual activity: Not Currently  Other Topics Concern   Not on file  Social History Narrative   Lives in [place].  Retired.   Social Drivers of Corporate investment banker Strain: Low Risk  (02/11/2022)   Overall  Financial Resource Strain (CARDIA)    Difficulty of Paying Living Expenses: Not very hard  Food Insecurity: No Food Insecurity (01/28/2022)   Hunger Vital Sign    Worried About Running Out of Food in the Last Year: Never true    Ran Out of Food in the Last Year: Never true  Transportation Needs: No Transportation Needs (01/28/2022)   PRAPARE - Administrator, Civil Service (Medical): No    Lack of Transportation (Non-Medical): No  Physical Activity: Sufficiently Active (03/25/2022)   Exercise Vital Sign    Days of Exercise per Week: 3 days    Minutes of Exercise per Session: 60 min  Stress: No Stress Concern Present (03/02/2022)   [employer] of Occupational Health - Occupational Stress Questionnaire    Feeling of Stress : Not at all  Social Connections: Moderately Integrated (04/08/2022)   Social Connection and Isolation Panel    Frequency of Communication with Friends and Family: More than three times a week    Frequency of Social Gatherings with Friends and Family: More than three times a week    Attends Religious Services: More than 4 times per year    Active Member of [employer] or Organizations: Yes    Attends Banker Meetings: More than 4 times per year    Marital Status: Widowed  Recent Concern: Social Connections - Moderately Isolated (04/08/2022)   Social Connection and Isolation Panel    Frequency of Communication with Friends and Family: Once a week    Frequency of Social Gatherings with Friends and Family: Once a week    Attends Religious Services: More than 4 times per year    Active Member of [employer] or Organizations: Yes  Attends Banker Meetings: More than 4 times per year    Marital Status: Widowed  Intimate Partner Violence: Unknown (05/16/2021)   Received from [facility]   HITS    Physically Hurt: Not on file    Insult or Talk Down To: Not on file    Threaten Physical Harm: Not on file    Scream or Curse: Not on file     Family History  Problem Relation Age of Onset   Heart disease Mother    Hypertension Mother    Stroke Mother    Stroke Father    Heart disease Father    Heart disease Brother    Hypertension Brother        2 brothers   CVA Brother        2 brothers   Heart attack Brother        2 brothers     Review of Systems  Constitutional: Negative.  Negative for chills and fever.  HENT: Negative.  Negative for congestion and sore throat.   Respiratory: Negative.  Negative for cough and shortness of breath.   Cardiovascular: Negative.  Negative for chest pain and palpitations.  Gastrointestinal:  Negative for abdominal pain, nausea and vomiting.  Musculoskeletal:  Positive for back pain.  Skin: Negative.  Negative for rash.  Neurological: Negative.  Negative for dizziness and headaches.  Psychiatric/Behavioral:  Positive for depression.   All other systems reviewed and are negative.   Vitals:   11/22/23 1325  BP: 106/70  Pulse: 86  Temp: 99.2 F (37.3 C)  SpO2: 94%    Physical Exam Vitals reviewed.  Constitutional:      Appearance: Normal appearance.  HENT:     Head: Normocephalic.     Mouth/Throat:     Mouth: Mucous membranes are moist.     Pharynx: Oropharynx is clear.  Eyes:     Extraocular Movements: Extraocular movements intact.     Pupils: Pupils are equal, round, and reactive to light.  Cardiovascular:     Rate and Rhythm: Normal rate and regular rhythm.     Pulses: Normal pulses.     Heart sounds: Normal heart sounds.  Pulmonary:     Effort: Pulmonary effort is normal.     Breath sounds: Normal breath sounds.  Musculoskeletal:     Cervical back: No tenderness.  Lymphadenopathy:     Cervical: No cervical adenopathy.  Skin:    General: Skin is warm and dry.     Capillary Refill: Capillary refill takes less than 2 seconds.  Neurological:     General: No focal deficit present.     Mental Status: She is alert and oriented to person, place, and time.   Psychiatric:        Mood and Affect: Mood normal.        Behavior: Behavior normal.      ASSESSMENT & PLAN: A total of 42 minutes was spent with the patient and counseling/coordination of care regarding preparing for this visit, review of most recent office visit notes, review of most recent emergency department visit notes, review of most recent chest imaging reports, review of multiple chronic medical conditions and their management, review of all medications, review of most recent bloodwork results, review of health maintenance items, education on nutrition, prognosis, documentation, and need for follow up.   Problem List Items Addressed This Visit       Cardiovascular and Mediastinum   PAF (paroxysmal atrial fibrillation) (HCC) (Chronic)  Presently in sinus rhythm. Continue Tikosyn  125 mg twice a day for rhythm control. Continue Plavix  75 mg daily.      Essential hypertension   BP Readings from Last 3 Encounters:  11/22/23 106/70  11/11/23 (!) 160/80  07/30/23 (!) 178/80  Well-controlled hypertension Continues carvedilol  6.25 mg twice a day Amlodipine  5 mg daily       Chronic diastolic heart failure (HCC)   Clinically stable Blood pressure well-controlled No findings of acute CHF      Coronary artery disease involving coronary bypass graft of native heart without angina pectoris   Stable without recent anginal episodes      Aortic atherosclerosis   Diet and nutrition discussed Continue rosuvastatin  10 mg daily      Ascending aorta dilatation   Recent CT of chest shows 4.4 cm dilatation Monitoring recommended Importance of blood pressure control discussed with patient        Genitourinary   CKD (chronic kidney disease), stage II   Chronic kidney disease, stage 3b (HCC)   Chronic stable condition Advised to stay well-hydrated and avoid NSAIDs        Other   Dyslipidemia   Chronic stable condition Continues rosuvastatin  10 mg daily      Moderate  episode of recurrent major depressive disorder (HCC) - Primary   Currently active and affecting quality of life. Evaluated by psychiatrist in the recent past Failed Celexa  and Wellbutrin  Patient willing to try Effexor We will start at 37.5 mg daily      Relevant Medications   venlafaxine XR (EFFEXOR XR) 37.5 MG 24 hr capsule   Musculoskeletal back pain   Stable.  Robaxin  and Tylenol  works for her New prescription for Robaxin  sent to pharmacy of record today      Relevant Medications   methocarbamol  (ROBAXIN ) 500 MG tablet   Other Visit Diagnoses       Hospital discharge follow-up          Patient Instructions  Health Maintenance After Age 22 After age 50, you are at a higher risk for certain long-term diseases and infections as well as injuries from falls. Falls are a major cause of broken bones and head injuries in people who are older than age 17. Getting regular preventive care can help to keep you healthy and well. Preventive care includes getting regular testing and making lifestyle changes as recommended by your health care provider. Talk with your health care provider about: Which screenings and tests you should have. A screening is a test that checks for a disease when you have no symptoms. A diet and exercise plan that is right for you. What should I know about screenings and tests to prevent falls? Screening and testing are the best ways to find a health problem early. Early diagnosis and treatment give you the best chance of managing medical conditions that are common after age 57. Certain conditions and lifestyle choices may make you more likely to have a fall. Your health care provider may recommend: Regular vision checks. Poor vision and conditions such as cataracts can make you more likely to have a fall. If you wear glasses, make sure to get your prescription updated if your vision changes. Medicine review. Work with your health care provider to regularly review all of  the medicines you are taking, including over-the-counter medicines. Ask your health care provider about any side effects that may make you more likely to have a fall. Tell your health care provider if any medicines  that you take make you feel dizzy or sleepy. Strength and balance checks. Your health care provider may recommend certain tests to check your strength and balance while standing, walking, or changing positions. Foot health exam. Foot pain and numbness, as well as not wearing proper footwear, can make you more likely to have a fall. Screenings, including: Osteoporosis screening. Osteoporosis is a condition that causes the bones to get weaker and break more easily. Blood pressure screening. Blood pressure changes and medicines to control blood pressure can make you feel dizzy. Depression screening. You may be more likely to have a fall if you have a fear of falling, feel depressed, or feel unable to do activities that you used to do. Alcohol use screening. Using too much alcohol can affect your balance and may make you more likely to have a fall. Follow these instructions at home: Lifestyle Do not drink alcohol if: Your health care provider tells you not to drink. If you drink alcohol: Limit how much you have to: 0-1 drink a day for women. 0-2 drinks a day for men. Know how much alcohol is in your drink. In the [place] one drink equals one 12 oz bottle of beer (355 mL), one 5 oz glass of wine (148 mL), or one 1 oz glass of hard liquor (44 mL). Do not use any products that contain nicotine or tobacco. These products include cigarettes, chewing tobacco, and vaping devices, such as e-cigarettes. If you need help quitting, ask your health care provider. Activity  Follow a regular exercise program to stay fit. This will help you maintain your balance. Ask your health care provider what types of exercise are appropriate for you. If you need a cane or walker, use it as recommended by your  health care provider. Wear supportive shoes that have nonskid soles. Safety  Remove any tripping hazards, such as rugs, cords, and clutter. Install safety equipment such as grab bars in bathrooms and safety rails on stairs. Keep rooms and walkways well-lit. General instructions Talk with your health care provider about your risks for falling. Tell your health care provider if: You fall. Be sure to tell your health care provider about all falls, even ones that seem minor. You feel dizzy, tiredness (fatigue), or off-balance. Take over-the-counter and prescription medicines only as told by your health care provider. These include supplements. Eat a healthy diet and maintain a healthy weight. A healthy diet includes low-fat dairy products, low-fat (lean) meats, and fiber from whole grains, beans, and lots of fruits and vegetables. Stay current with your vaccines. Schedule regular health, dental, and eye exams. Summary Having a healthy lifestyle and getting preventive care can help to protect your health and wellness after age 52. Screening and testing are the best way to find a health problem early and help you avoid having a fall. Early diagnosis and treatment give you the best chance for managing medical conditions that are more common for people who are older than age 11. Falls are a major cause of broken bones and head injuries in people who are older than age 19. Take precautions to prevent a fall at home. Work with your health care provider to learn what changes you can make to improve your health and wellness and to prevent falls. This information is not intended to replace advice given to you by your health care provider. Make sure you discuss any questions you have with your health care provider. Document Revised: 06/17/2020 Document Reviewed: 06/17/2020 Elsevier Patient Education  2024 [employer]   Patient Instructions  Health Maintenance After Age 58 After age 63, you are at a  higher risk for certain long-term diseases and infections as well as injuries from falls. Falls are a major cause of broken bones and head injuries in people who are older than age 8. Getting regular preventive care can help to keep you healthy and well. Preventive care includes getting regular testing and making lifestyle changes as recommended by your health care provider. Talk with your health care provider about: Which screenings and tests you should have. A screening is a test that checks for a disease when you have no symptoms. A diet and exercise plan that is right for you. What should I know about screenings and tests to prevent falls? Screening and testing are the best ways to find a health problem early. Early diagnosis and treatment give you the best chance of managing medical conditions that are common after age 70. Certain conditions and lifestyle choices may make you more likely to have a fall. Your health care provider may recommend: Regular vision checks. Poor vision and conditions such as cataracts can make you more likely to have a fall. If you wear glasses, make sure to get your prescription updated if your vision changes. Medicine review. Work with your health care provider to regularly review all of the medicines you are taking, including over-the-counter medicines. Ask your health care provider about any side effects that may make you more likely to have a fall. Tell your health care provider if any medicines that you take make you feel dizzy or sleepy. Strength and balance checks. Your health care provider may recommend certain tests to check your strength and balance while standing, walking, or changing positions. Foot health exam. Foot pain and numbness, as well as not wearing proper footwear, can make you more likely to have a fall. Screenings, including: Osteoporosis screening. Osteoporosis is a condition that causes the bones to get weaker and break more easily. Blood pressure  screening. Blood pressure changes and medicines to control blood pressure can make you feel dizzy. Depression screening. You may be more likely to have a fall if you have a fear of falling, feel depressed, or feel unable to do activities that you used to do. Alcohol use screening. Using too much alcohol can affect your balance and may make you more likely to have a fall. Follow these instructions at home: Lifestyle Do not drink alcohol if: Your health care provider tells you not to drink. If you drink alcohol: Limit how much you have to: 0-1 drink a day for women. 0-2 drinks a day for men. Know how much alcohol is in your drink. In the [place] one drink equals one 12 oz bottle of beer (355 mL), one 5 oz glass of wine (148 mL), or one 1 oz glass of hard liquor (44 mL). Do not use any products that contain nicotine or tobacco. These products include cigarettes, chewing tobacco, and vaping devices, such as e-cigarettes. If you need help quitting, ask your health care provider. Activity  Follow a regular exercise program to stay fit. This will help you maintain your balance. Ask your health care provider what types of exercise are appropriate for you. If you need a cane or walker, use it as recommended by your health care provider. Wear supportive shoes that have nonskid soles. Safety  Remove any tripping hazards, such as rugs, cords, and clutter. Install safety equipment such as grab bars in bathrooms  and safety rails on stairs. Keep rooms and walkways well-lit. General instructions Talk with your health care provider about your risks for falling. Tell your health care provider if: You fall. Be sure to tell your health care provider about all falls, even ones that seem minor. You feel dizzy, tiredness (fatigue), or off-balance. Take over-the-counter and prescription medicines only as told by your health care provider. These include supplements. Eat a healthy diet and maintain a healthy  weight. A healthy diet includes low-fat dairy products, low-fat (lean) meats, and fiber from whole grains, beans, and lots of fruits and vegetables. Stay current with your vaccines. Schedule regular health, dental, and eye exams. Summary Having a healthy lifestyle and getting preventive care can help to protect your health and wellness after age 65. Screening and testing are the best way to find a health problem early and help you avoid having a fall. Early diagnosis and treatment give you the best chance for managing medical conditions that are more common for people who are older than age 51. Falls are a major cause of broken bones and head injuries in people who are older than age 63. Take precautions to prevent a fall at home. Work with your health care provider to learn what changes you can make to improve your health and wellness and to prevent falls. This information is not intended to replace advice given to you by your health care provider. Make sure you discuss any questions you have with your health care provider. Document Revised: 06/17/2020 Document Reviewed: 06/17/2020 Elsevier Patient Education  2024 [employer]    Emil Schaumann, MD [facility]

## 2023-11-22 NOTE — Assessment & Plan Note (Signed)
 Chronic stable condition. Advised to stay well-hydrated and avoid NSAIDs

## 2023-11-22 NOTE — Assessment & Plan Note (Signed)
 Recent CT of chest shows 4.4 cm dilatation Monitoring recommended Importance of blood pressure control discussed with patient

## 2023-11-22 NOTE — Assessment & Plan Note (Signed)
 BP Readings from Last 3 Encounters:  11/22/23 106/70  11/11/23 (!) 160/80  07/30/23 (!) 178/80  Well-controlled hypertension Continues carvedilol  6.25 mg twice a day Amlodipine  5 mg daily

## 2023-11-22 NOTE — Assessment & Plan Note (Signed)
 Stable.  Robaxin  and Tylenol  works for her New prescription for Robaxin  sent to pharmacy of record today

## 2023-11-30 NOTE — Telephone Encounter (Signed)
 Please make an appointment - first available, not urgent, when her son can also be there.

## 2023-12-03 NOTE — Telephone Encounter (Signed)
 S/w the patient- made and appt for 12/22/23 at 0920

## 2023-12-22 ENCOUNTER — Ambulatory Visit: Admitting: Cardiovascular Disease

## 2024-01-28 ENCOUNTER — Ambulatory Visit

## 2024-02-20 ENCOUNTER — Encounter: Payer: Self-pay | Admitting: Cardiovascular Disease

## 2024-03-14 ENCOUNTER — Ambulatory Visit: Admitting: Cardiovascular Disease

## 2024-03-16 ENCOUNTER — Ambulatory Visit: Payer: Self-pay | Admitting: Cardiovascular Disease

## 2024-03-16 ENCOUNTER — Ambulatory Visit

## 2024-03-16 LAB — CUP PACEART REMOTE DEVICE CHECK
Battery Impedance: 2551 Ohm
Battery Remaining Longevity: 26 mo
Battery Voltage: 2.73 V
Brady Statistic AP VP Percent: 0 %
Brady Statistic AP VS Percent: 99 %
Brady Statistic AS VP Percent: 0 %
Brady Statistic AS VS Percent: 0 %
Date Time Interrogation Session: 20260205100143
Implantable Lead Connection Status: 753985
Implantable Lead Connection Status: 753985
Implantable Lead Implant Date: 20131212
Implantable Lead Implant Date: 20131212
Implantable Lead Location: 753859
Implantable Lead Location: 753860
Implantable Lead Model: 5076
Implantable Lead Model: 5076
Implantable Pulse Generator Implant Date: 20131212
Lead Channel Impedance Value: 443 Ohm
Lead Channel Impedance Value: 771 Ohm
Lead Channel Pacing Threshold Amplitude: 0.75 V
Lead Channel Pacing Threshold Amplitude: 1.125 V
Lead Channel Pacing Threshold Pulse Width: 0.4 ms
Lead Channel Pacing Threshold Pulse Width: 0.4 ms
Lead Channel Setting Pacing Amplitude: 2.25 V
Lead Channel Setting Pacing Amplitude: 2.5 V
Lead Channel Setting Pacing Pulse Width: 0.4 ms
Lead Channel Setting Sensing Sensitivity: 5.6 mV
Zone Setting Status: 755011
Zone Setting Status: 755011

## 2024-03-28 ENCOUNTER — Ambulatory Visit: Admitting: Cardiovascular Disease

## 2024-06-01 ENCOUNTER — Ambulatory Visit

## 2024-08-15 ENCOUNTER — Ambulatory Visit

## 2024-09-14 ENCOUNTER — Ambulatory Visit

## 2025-03-15 ENCOUNTER — Ambulatory Visit
